# Patient Record
Sex: Female | Born: 1945
Health system: Southern US, Community
[De-identification: ages and names within clinical notes are randomized; demographics above are authoritative.]

## PROBLEM LIST (undated history)

## (undated) DIAGNOSIS — G2401 Drug induced subacute dyskinesia: Secondary | ICD-10-CM

## (undated) DIAGNOSIS — I77819 Aortic ectasia, unspecified site: Secondary | ICD-10-CM

## (undated) DIAGNOSIS — F200 Paranoid schizophrenia: Secondary | ICD-10-CM

## (undated) DIAGNOSIS — R413 Other amnesia: Secondary | ICD-10-CM

## (undated) DIAGNOSIS — J449 Chronic obstructive pulmonary disease, unspecified: Secondary | ICD-10-CM

## (undated) DIAGNOSIS — F99 Mental disorder, not otherwise specified: Secondary | ICD-10-CM

## (undated) DIAGNOSIS — K219 Gastro-esophageal reflux disease without esophagitis: Secondary | ICD-10-CM

## (undated) DIAGNOSIS — E119 Type 2 diabetes mellitus without complications: Secondary | ICD-10-CM

## (undated) DIAGNOSIS — I2510 Atherosclerotic heart disease of native coronary artery without angina pectoris: Secondary | ICD-10-CM

## (undated) DIAGNOSIS — N189 Chronic kidney disease, unspecified: Secondary | ICD-10-CM

## (undated) DIAGNOSIS — Z950 Presence of cardiac pacemaker: Secondary | ICD-10-CM

## (undated) DIAGNOSIS — I4891 Unspecified atrial fibrillation: Secondary | ICD-10-CM

## (undated) DIAGNOSIS — I10 Essential (primary) hypertension: Secondary | ICD-10-CM

## (undated) DIAGNOSIS — I509 Heart failure, unspecified: Secondary | ICD-10-CM

## (undated) DIAGNOSIS — F29 Unspecified psychosis not due to a substance or known physiological condition: Secondary | ICD-10-CM

## (undated) HISTORY — DX: Unspecified atrial fibrillation: I48.91

## (undated) HISTORY — DX: Aortic ectasia, unspecified site: I77.819

## (undated) HISTORY — PX: ELBOW SURGERY: SHX618

## (undated) HISTORY — DX: Presence of cardiac pacemaker: Z95.0

## (undated) HISTORY — DX: Drug induced subacute dyskinesia: G24.01

## (undated) HISTORY — PX: CHOLECYSTECTOMY: SHX55

## (undated) HISTORY — DX: Unspecified psychosis not due to a substance or known physiological condition: F29

## (undated) HISTORY — DX: Essential (primary) hypertension: I10

## (undated) HISTORY — PX: TUBAL LIGATION: SHX77

## (undated) HISTORY — DX: Chronic kidney disease, unspecified: N18.9

## (undated) HISTORY — DX: Type 2 diabetes mellitus without complications: E11.9

## (undated) HISTORY — DX: Other amnesia: R41.3

---

## 1997-05-05 ENCOUNTER — Encounter: Admission: RE | Admit: 1997-05-05 | Discharge: 1997-05-05 | Payer: Self-pay | Admitting: Family Medicine

## 1997-05-20 ENCOUNTER — Encounter: Admission: RE | Admit: 1997-05-20 | Discharge: 1997-05-20 | Payer: Self-pay | Admitting: Obstetrics

## 1997-06-02 ENCOUNTER — Encounter: Admission: RE | Admit: 1997-06-02 | Discharge: 1997-06-02 | Payer: Self-pay | Admitting: Family Medicine

## 1997-06-03 ENCOUNTER — Encounter: Admission: RE | Admit: 1997-06-03 | Discharge: 1997-06-03 | Payer: Self-pay | Admitting: Obstetrics

## 1997-07-12 ENCOUNTER — Encounter: Admission: RE | Admit: 1997-07-12 | Discharge: 1997-07-12 | Payer: Self-pay | Admitting: Family Medicine

## 1997-08-04 ENCOUNTER — Encounter: Admission: RE | Admit: 1997-08-04 | Discharge: 1997-08-04 | Payer: Self-pay | Admitting: Family Medicine

## 1997-08-13 ENCOUNTER — Encounter: Admission: RE | Admit: 1997-08-13 | Discharge: 1997-08-13 | Payer: Self-pay | Admitting: Family Medicine

## 1997-08-18 ENCOUNTER — Other Ambulatory Visit: Admission: RE | Admit: 1997-08-18 | Discharge: 1997-08-18 | Payer: Self-pay | Admitting: *Deleted

## 1997-08-18 ENCOUNTER — Encounter: Admission: RE | Admit: 1997-08-18 | Discharge: 1997-08-18 | Payer: Self-pay | Admitting: Family Medicine

## 1997-08-24 ENCOUNTER — Encounter: Admission: RE | Admit: 1997-08-24 | Discharge: 1997-08-24 | Payer: Self-pay | Admitting: Family Medicine

## 1997-08-30 ENCOUNTER — Encounter: Admission: RE | Admit: 1997-08-30 | Discharge: 1997-08-30 | Payer: Self-pay | Admitting: Sports Medicine

## 1997-09-17 ENCOUNTER — Encounter: Admission: RE | Admit: 1997-09-17 | Discharge: 1997-09-17 | Payer: Self-pay | Admitting: Family Medicine

## 1997-11-16 ENCOUNTER — Encounter: Admission: RE | Admit: 1997-11-16 | Discharge: 1997-11-16 | Payer: Self-pay | Admitting: Sports Medicine

## 1997-11-26 ENCOUNTER — Encounter: Admission: RE | Admit: 1997-11-26 | Discharge: 1997-11-26 | Payer: Self-pay | Admitting: Family Medicine

## 1997-11-30 ENCOUNTER — Encounter: Admission: RE | Admit: 1997-11-30 | Discharge: 1997-11-30 | Payer: Self-pay | Admitting: Sports Medicine

## 1997-12-17 ENCOUNTER — Encounter: Admission: RE | Admit: 1997-12-17 | Discharge: 1997-12-17 | Payer: Self-pay | Admitting: Family Medicine

## 1998-02-13 ENCOUNTER — Emergency Department (HOSPITAL_COMMUNITY): Admission: EM | Admit: 1998-02-13 | Discharge: 1998-02-13 | Payer: Self-pay | Admitting: Emergency Medicine

## 1998-02-13 ENCOUNTER — Encounter: Payer: Self-pay | Admitting: Emergency Medicine

## 1998-03-23 ENCOUNTER — Encounter: Admission: RE | Admit: 1998-03-23 | Discharge: 1998-03-23 | Payer: Self-pay | Admitting: Family Medicine

## 1998-04-13 ENCOUNTER — Encounter: Admission: RE | Admit: 1998-04-13 | Discharge: 1998-04-13 | Payer: Self-pay | Admitting: Family Medicine

## 1998-05-03 ENCOUNTER — Encounter: Admission: RE | Admit: 1998-05-03 | Discharge: 1998-05-03 | Payer: Self-pay | Admitting: Sports Medicine

## 1998-05-11 ENCOUNTER — Encounter: Admission: RE | Admit: 1998-05-11 | Discharge: 1998-05-11 | Payer: Self-pay | Admitting: Family Medicine

## 1998-05-17 ENCOUNTER — Encounter: Admission: RE | Admit: 1998-05-17 | Discharge: 1998-05-17 | Payer: Self-pay | Admitting: Sports Medicine

## 1998-05-24 ENCOUNTER — Encounter: Admission: RE | Admit: 1998-05-24 | Discharge: 1998-05-24 | Payer: Self-pay | Admitting: Family Medicine

## 1998-06-03 ENCOUNTER — Encounter: Admission: RE | Admit: 1998-06-03 | Discharge: 1998-06-03 | Payer: Self-pay | Admitting: Family Medicine

## 1998-07-08 ENCOUNTER — Encounter: Admission: RE | Admit: 1998-07-08 | Discharge: 1998-07-08 | Payer: Self-pay | Admitting: Family Medicine

## 1998-07-26 ENCOUNTER — Encounter: Admission: RE | Admit: 1998-07-26 | Discharge: 1998-07-26 | Payer: Self-pay | Admitting: Obstetrics & Gynecology

## 1998-07-27 ENCOUNTER — Encounter: Admission: RE | Admit: 1998-07-27 | Discharge: 1998-07-27 | Payer: Self-pay | Admitting: Family Medicine

## 1998-08-23 ENCOUNTER — Encounter: Admission: RE | Admit: 1998-08-23 | Discharge: 1998-08-23 | Payer: Self-pay | Admitting: Family Medicine

## 1998-08-31 ENCOUNTER — Encounter: Admission: RE | Admit: 1998-08-31 | Discharge: 1998-08-31 | Payer: Self-pay | Admitting: Family Medicine

## 1998-09-20 ENCOUNTER — Encounter: Admission: RE | Admit: 1998-09-20 | Discharge: 1998-09-20 | Payer: Self-pay | Admitting: Sports Medicine

## 1998-09-20 ENCOUNTER — Other Ambulatory Visit: Admission: RE | Admit: 1998-09-20 | Discharge: 1998-09-20 | Payer: Self-pay | Admitting: Sports Medicine

## 1998-10-28 ENCOUNTER — Encounter: Admission: RE | Admit: 1998-10-28 | Discharge: 1998-10-28 | Payer: Self-pay | Admitting: Family Medicine

## 1999-01-30 ENCOUNTER — Encounter: Payer: Self-pay | Admitting: Emergency Medicine

## 1999-01-30 ENCOUNTER — Emergency Department (HOSPITAL_COMMUNITY): Admission: EM | Admit: 1999-01-30 | Discharge: 1999-01-30 | Payer: Self-pay | Admitting: Emergency Medicine

## 1999-02-01 ENCOUNTER — Encounter: Admission: RE | Admit: 1999-02-01 | Discharge: 1999-02-01 | Payer: Self-pay | Admitting: Family Medicine

## 1999-03-08 ENCOUNTER — Ambulatory Visit (HOSPITAL_COMMUNITY): Admission: RE | Admit: 1999-03-08 | Discharge: 1999-03-08 | Payer: Self-pay | Admitting: Family Medicine

## 1999-03-08 ENCOUNTER — Encounter: Admission: RE | Admit: 1999-03-08 | Discharge: 1999-03-08 | Payer: Self-pay | Admitting: Family Medicine

## 1999-05-02 ENCOUNTER — Encounter: Admission: RE | Admit: 1999-05-02 | Discharge: 1999-05-02 | Payer: Self-pay | Admitting: Family Medicine

## 1999-05-11 ENCOUNTER — Encounter: Admission: RE | Admit: 1999-05-11 | Discharge: 1999-05-11 | Payer: Self-pay | Admitting: Obstetrics

## 1999-05-11 ENCOUNTER — Other Ambulatory Visit: Admission: RE | Admit: 1999-05-11 | Discharge: 1999-05-11 | Payer: Self-pay | Admitting: Obstetrics

## 1999-06-13 ENCOUNTER — Encounter: Admission: RE | Admit: 1999-06-13 | Discharge: 1999-06-13 | Payer: Self-pay | Admitting: Sports Medicine

## 1999-08-29 ENCOUNTER — Encounter: Admission: RE | Admit: 1999-08-29 | Discharge: 1999-08-29 | Payer: Self-pay | Admitting: Family Medicine

## 1999-09-21 ENCOUNTER — Encounter: Admission: RE | Admit: 1999-09-21 | Discharge: 1999-09-21 | Payer: Self-pay | Admitting: Family Medicine

## 1999-09-28 ENCOUNTER — Encounter: Admission: RE | Admit: 1999-09-28 | Discharge: 1999-09-28 | Payer: Self-pay | Admitting: Family Medicine

## 1999-10-03 ENCOUNTER — Encounter: Admission: RE | Admit: 1999-10-03 | Discharge: 1999-10-03 | Payer: Self-pay | Admitting: Family Medicine

## 1999-11-22 ENCOUNTER — Encounter: Admission: RE | Admit: 1999-11-22 | Discharge: 1999-11-22 | Payer: Self-pay | Admitting: Family Medicine

## 2000-01-15 ENCOUNTER — Encounter: Admission: RE | Admit: 2000-01-15 | Discharge: 2000-01-15 | Payer: Self-pay | Admitting: Family Medicine

## 2000-01-15 ENCOUNTER — Ambulatory Visit (HOSPITAL_COMMUNITY): Admission: RE | Admit: 2000-01-15 | Discharge: 2000-01-15 | Payer: Self-pay | Admitting: Family Medicine

## 2000-02-06 ENCOUNTER — Encounter: Payer: Self-pay | Admitting: Urology

## 2000-03-08 ENCOUNTER — Encounter: Admission: RE | Admit: 2000-03-08 | Discharge: 2000-03-08 | Payer: Self-pay | Admitting: Family Medicine

## 2000-03-12 ENCOUNTER — Ambulatory Visit (HOSPITAL_COMMUNITY): Admission: RE | Admit: 2000-03-12 | Discharge: 2000-03-12 | Payer: Self-pay | Admitting: Urology

## 2000-04-03 ENCOUNTER — Encounter: Admission: RE | Admit: 2000-04-03 | Discharge: 2000-04-03 | Payer: Self-pay | Admitting: Family Medicine

## 2000-05-13 ENCOUNTER — Encounter: Admission: RE | Admit: 2000-05-13 | Discharge: 2000-05-13 | Payer: Self-pay | Admitting: Family Medicine

## 2000-10-04 ENCOUNTER — Encounter: Admission: RE | Admit: 2000-10-04 | Discharge: 2000-10-04 | Payer: Self-pay | Admitting: Family Medicine

## 2000-10-11 ENCOUNTER — Ambulatory Visit (HOSPITAL_COMMUNITY): Admission: RE | Admit: 2000-10-11 | Discharge: 2000-10-11 | Payer: Self-pay | Admitting: Family Medicine

## 2000-10-11 ENCOUNTER — Encounter: Admission: RE | Admit: 2000-10-11 | Discharge: 2000-10-11 | Payer: Self-pay | Admitting: Family Medicine

## 2000-10-31 ENCOUNTER — Encounter: Admission: RE | Admit: 2000-10-31 | Discharge: 2000-10-31 | Payer: Self-pay | Admitting: Family Medicine

## 2001-02-19 ENCOUNTER — Encounter: Admission: RE | Admit: 2001-02-19 | Discharge: 2001-02-19 | Payer: Self-pay | Admitting: Family Medicine

## 2001-05-14 ENCOUNTER — Encounter: Admission: RE | Admit: 2001-05-14 | Discharge: 2001-05-14 | Payer: Self-pay | Admitting: Family Medicine

## 2001-05-28 ENCOUNTER — Encounter: Admission: RE | Admit: 2001-05-28 | Discharge: 2001-05-28 | Payer: Self-pay | Admitting: Sports Medicine

## 2001-05-28 ENCOUNTER — Encounter: Payer: Self-pay | Admitting: Sports Medicine

## 2001-06-13 ENCOUNTER — Encounter: Admission: RE | Admit: 2001-06-13 | Discharge: 2001-06-13 | Payer: Self-pay | Admitting: Family Medicine

## 2001-06-13 ENCOUNTER — Ambulatory Visit (HOSPITAL_COMMUNITY): Admission: RE | Admit: 2001-06-13 | Discharge: 2001-06-13 | Payer: Self-pay | Admitting: Family Medicine

## 2001-08-19 ENCOUNTER — Encounter: Admission: RE | Admit: 2001-08-19 | Discharge: 2001-08-19 | Payer: Self-pay | Admitting: Family Medicine

## 2001-09-21 ENCOUNTER — Emergency Department (HOSPITAL_COMMUNITY): Admission: EM | Admit: 2001-09-21 | Discharge: 2001-09-21 | Payer: Self-pay | Admitting: Emergency Medicine

## 2001-09-22 ENCOUNTER — Ambulatory Visit (HOSPITAL_COMMUNITY): Admission: RE | Admit: 2001-09-22 | Discharge: 2001-09-22 | Payer: Self-pay

## 2001-09-22 ENCOUNTER — Encounter: Admission: RE | Admit: 2001-09-22 | Discharge: 2001-09-22 | Payer: Self-pay | Admitting: Family Medicine

## 2001-10-06 ENCOUNTER — Ambulatory Visit (HOSPITAL_COMMUNITY): Admission: RE | Admit: 2001-10-06 | Discharge: 2001-10-06 | Payer: Self-pay | Admitting: Surgery

## 2001-10-06 ENCOUNTER — Encounter: Payer: Self-pay | Admitting: Surgery

## 2001-10-27 ENCOUNTER — Ambulatory Visit (HOSPITAL_COMMUNITY): Admission: RE | Admit: 2001-10-27 | Discharge: 2001-10-28 | Payer: Self-pay | Admitting: Surgery

## 2001-10-27 ENCOUNTER — Encounter (INDEPENDENT_AMBULATORY_CARE_PROVIDER_SITE_OTHER): Payer: Self-pay | Admitting: *Deleted

## 2001-10-27 ENCOUNTER — Encounter: Payer: Self-pay | Admitting: Surgery

## 2001-12-17 ENCOUNTER — Encounter: Admission: RE | Admit: 2001-12-17 | Discharge: 2001-12-17 | Payer: Self-pay | Admitting: Family Medicine

## 2001-12-23 ENCOUNTER — Encounter: Admission: RE | Admit: 2001-12-23 | Discharge: 2001-12-23 | Payer: Self-pay | Admitting: Family Medicine

## 2002-01-01 ENCOUNTER — Encounter: Admission: RE | Admit: 2002-01-01 | Discharge: 2002-01-01 | Payer: Self-pay | Admitting: Family Medicine

## 2002-01-14 ENCOUNTER — Encounter: Admission: RE | Admit: 2002-01-14 | Discharge: 2002-01-14 | Payer: Self-pay | Admitting: Sports Medicine

## 2002-05-27 ENCOUNTER — Encounter: Admission: RE | Admit: 2002-05-27 | Discharge: 2002-05-27 | Payer: Self-pay | Admitting: Family Medicine

## 2002-06-24 ENCOUNTER — Encounter: Admission: RE | Admit: 2002-06-24 | Discharge: 2002-06-24 | Payer: Self-pay | Admitting: Family Medicine

## 2002-07-24 ENCOUNTER — Encounter: Admission: RE | Admit: 2002-07-24 | Discharge: 2002-07-24 | Payer: Self-pay | Admitting: Sports Medicine

## 2002-07-24 ENCOUNTER — Encounter: Payer: Self-pay | Admitting: Sports Medicine

## 2002-07-29 ENCOUNTER — Encounter: Admission: RE | Admit: 2002-07-29 | Discharge: 2002-07-29 | Payer: Self-pay | Admitting: Family Medicine

## 2002-08-14 ENCOUNTER — Encounter: Admission: RE | Admit: 2002-08-14 | Discharge: 2002-08-14 | Payer: Self-pay | Admitting: Family Medicine

## 2002-08-18 ENCOUNTER — Encounter: Admission: RE | Admit: 2002-08-18 | Discharge: 2002-08-18 | Payer: Self-pay | Admitting: Obstetrics and Gynecology

## 2002-08-18 ENCOUNTER — Encounter (INDEPENDENT_AMBULATORY_CARE_PROVIDER_SITE_OTHER): Payer: Self-pay | Admitting: *Deleted

## 2002-08-27 ENCOUNTER — Ambulatory Visit (HOSPITAL_COMMUNITY): Admission: RE | Admit: 2002-08-27 | Discharge: 2002-08-27 | Payer: Self-pay | Admitting: Obstetrics and Gynecology

## 2002-09-24 ENCOUNTER — Encounter: Admission: RE | Admit: 2002-09-24 | Discharge: 2002-09-24 | Payer: Self-pay | Admitting: Sports Medicine

## 2002-11-06 ENCOUNTER — Encounter: Admission: RE | Admit: 2002-11-06 | Discharge: 2002-11-06 | Payer: Self-pay | Admitting: Sports Medicine

## 2002-11-24 ENCOUNTER — Encounter: Admission: RE | Admit: 2002-11-24 | Discharge: 2002-11-24 | Payer: Self-pay | Admitting: Sports Medicine

## 2003-01-22 ENCOUNTER — Encounter: Admission: RE | Admit: 2003-01-22 | Discharge: 2003-01-22 | Payer: Self-pay | Admitting: Family Medicine

## 2003-02-03 ENCOUNTER — Encounter: Admission: RE | Admit: 2003-02-03 | Discharge: 2003-02-03 | Payer: Self-pay | Admitting: Family Medicine

## 2003-02-03 ENCOUNTER — Ambulatory Visit (HOSPITAL_COMMUNITY): Admission: RE | Admit: 2003-02-03 | Discharge: 2003-02-03 | Payer: Self-pay | Admitting: Family Medicine

## 2003-05-27 ENCOUNTER — Emergency Department (HOSPITAL_COMMUNITY): Admission: EM | Admit: 2003-05-27 | Discharge: 2003-05-27 | Payer: Self-pay | Admitting: Family Medicine

## 2003-07-26 ENCOUNTER — Encounter: Payer: Self-pay | Admitting: Family Medicine

## 2003-07-29 ENCOUNTER — Encounter: Admission: RE | Admit: 2003-07-29 | Discharge: 2003-07-29 | Payer: Self-pay | Admitting: Family Medicine

## 2003-08-06 ENCOUNTER — Ambulatory Visit (HOSPITAL_COMMUNITY): Admission: RE | Admit: 2003-08-06 | Discharge: 2003-08-07 | Payer: Self-pay | Admitting: Cardiology

## 2003-09-06 ENCOUNTER — Encounter: Admission: RE | Admit: 2003-09-06 | Discharge: 2003-09-06 | Payer: Self-pay | Admitting: Family Medicine

## 2003-09-09 ENCOUNTER — Encounter: Admission: RE | Admit: 2003-09-09 | Discharge: 2003-09-09 | Payer: Self-pay | Admitting: Sports Medicine

## 2003-09-24 ENCOUNTER — Encounter: Admission: RE | Admit: 2003-09-24 | Discharge: 2003-09-24 | Payer: Self-pay | Admitting: Sports Medicine

## 2003-11-04 ENCOUNTER — Ambulatory Visit: Payer: Self-pay | Admitting: Family Medicine

## 2003-12-24 ENCOUNTER — Ambulatory Visit: Payer: Self-pay | Admitting: Sports Medicine

## 2004-01-27 ENCOUNTER — Encounter: Admission: RE | Admit: 2004-01-27 | Discharge: 2004-01-27 | Payer: Self-pay | Admitting: Sports Medicine

## 2004-01-27 ENCOUNTER — Ambulatory Visit: Payer: Self-pay | Admitting: Family Medicine

## 2004-04-11 ENCOUNTER — Ambulatory Visit: Payer: Self-pay | Admitting: Family Medicine

## 2004-04-15 ENCOUNTER — Encounter (INDEPENDENT_AMBULATORY_CARE_PROVIDER_SITE_OTHER): Payer: Self-pay | Admitting: *Deleted

## 2004-05-12 ENCOUNTER — Other Ambulatory Visit: Admission: RE | Admit: 2004-05-12 | Discharge: 2004-05-12 | Payer: Self-pay | Admitting: Family Medicine

## 2004-05-12 ENCOUNTER — Ambulatory Visit: Payer: Self-pay | Admitting: Family Medicine

## 2004-05-12 ENCOUNTER — Ambulatory Visit (HOSPITAL_COMMUNITY): Admission: RE | Admit: 2004-05-12 | Discharge: 2004-05-12 | Payer: Self-pay | Admitting: Family Medicine

## 2004-05-17 ENCOUNTER — Ambulatory Visit (HOSPITAL_COMMUNITY): Admission: RE | Admit: 2004-05-17 | Discharge: 2004-05-17 | Payer: Self-pay | Admitting: Family Medicine

## 2004-05-22 ENCOUNTER — Ambulatory Visit: Payer: Self-pay | Admitting: Sports Medicine

## 2004-07-04 ENCOUNTER — Ambulatory Visit: Payer: Self-pay | Admitting: Family Medicine

## 2004-07-24 ENCOUNTER — Ambulatory Visit: Payer: Self-pay | Admitting: Sports Medicine

## 2004-11-03 ENCOUNTER — Ambulatory Visit: Payer: Self-pay | Admitting: Sports Medicine

## 2005-01-15 HISTORY — PX: INSERT / REPLACE / REMOVE PACEMAKER: SUR710

## 2005-01-15 HISTORY — PX: CARDIAC CATHETERIZATION: SHX172

## 2005-02-13 ENCOUNTER — Ambulatory Visit: Payer: Self-pay | Admitting: Sports Medicine

## 2005-03-14 ENCOUNTER — Ambulatory Visit: Payer: Self-pay | Admitting: Family Medicine

## 2005-05-07 ENCOUNTER — Ambulatory Visit: Payer: Self-pay | Admitting: Family Medicine

## 2005-05-07 ENCOUNTER — Ambulatory Visit (HOSPITAL_COMMUNITY): Admission: RE | Admit: 2005-05-07 | Discharge: 2005-05-07 | Payer: Self-pay | Admitting: Family Medicine

## 2005-05-21 ENCOUNTER — Ambulatory Visit: Payer: Self-pay | Admitting: Family Medicine

## 2005-06-04 ENCOUNTER — Ambulatory Visit: Payer: Self-pay | Admitting: Family Medicine

## 2005-07-09 ENCOUNTER — Ambulatory Visit: Payer: Self-pay | Admitting: Sports Medicine

## 2005-08-15 ENCOUNTER — Ambulatory Visit: Payer: Self-pay | Admitting: Obstetrics and Gynecology

## 2005-08-15 ENCOUNTER — Encounter: Payer: Self-pay | Admitting: Obstetrics and Gynecology

## 2005-08-17 ENCOUNTER — Ambulatory Visit (HOSPITAL_COMMUNITY): Admission: RE | Admit: 2005-08-17 | Discharge: 2005-08-17 | Payer: Self-pay | Admitting: Family Medicine

## 2005-09-12 ENCOUNTER — Other Ambulatory Visit: Admission: RE | Admit: 2005-09-12 | Discharge: 2005-09-12 | Payer: Self-pay | Admitting: Obstetrics and Gynecology

## 2005-09-12 ENCOUNTER — Ambulatory Visit: Payer: Self-pay | Admitting: Obstetrics and Gynecology

## 2005-09-12 ENCOUNTER — Encounter (INDEPENDENT_AMBULATORY_CARE_PROVIDER_SITE_OTHER): Payer: Self-pay | Admitting: Specialist

## 2005-09-20 ENCOUNTER — Ambulatory Visit: Payer: Self-pay | Admitting: Family Medicine

## 2005-09-28 ENCOUNTER — Ambulatory Visit: Payer: Self-pay | Admitting: Family Medicine

## 2005-09-28 ENCOUNTER — Inpatient Hospital Stay (HOSPITAL_COMMUNITY): Admission: EM | Admit: 2005-09-28 | Discharge: 2005-10-01 | Payer: Self-pay | Admitting: Emergency Medicine

## 2005-09-28 HISTORY — PX: PACEMAKER PLACEMENT: SHX43

## 2005-10-09 ENCOUNTER — Ambulatory Visit: Payer: Self-pay | Admitting: Family Medicine

## 2005-10-31 ENCOUNTER — Ambulatory Visit (HOSPITAL_COMMUNITY): Admission: RE | Admit: 2005-10-31 | Discharge: 2005-10-31 | Payer: Self-pay | Admitting: Family Medicine

## 2005-10-31 ENCOUNTER — Ambulatory Visit: Payer: Self-pay | Admitting: Family Medicine

## 2005-11-12 ENCOUNTER — Ambulatory Visit: Payer: Self-pay | Admitting: Family Medicine

## 2006-01-25 ENCOUNTER — Ambulatory Visit (HOSPITAL_COMMUNITY): Admission: RE | Admit: 2006-01-25 | Discharge: 2006-01-25 | Payer: Self-pay | Admitting: Family Medicine

## 2006-01-25 ENCOUNTER — Ambulatory Visit: Payer: Self-pay | Admitting: Family Medicine

## 2006-01-25 ENCOUNTER — Encounter (INDEPENDENT_AMBULATORY_CARE_PROVIDER_SITE_OTHER): Payer: Self-pay | Admitting: Family Medicine

## 2006-01-25 LAB — CONVERTED CEMR LAB
AST: 16 units/L (ref 0–37)
Albumin: 4.1 g/dL (ref 3.5–5.2)
BUN: 16 mg/dL (ref 6–23)
CO2: 19 meq/L (ref 19–32)
Creatinine, Ser: 1.4 mg/dL — ABNORMAL HIGH (ref 0.40–1.20)
Glucose, Bld: 98 mg/dL (ref 70–99)
Sodium: 139 meq/L (ref 135–145)
Total Protein: 6.8 g/dL (ref 6.0–8.3)

## 2006-01-26 ENCOUNTER — Encounter (INDEPENDENT_AMBULATORY_CARE_PROVIDER_SITE_OTHER): Payer: Self-pay | Admitting: Family Medicine

## 2006-03-14 DIAGNOSIS — F200 Paranoid schizophrenia: Secondary | ICD-10-CM | POA: Insufficient documentation

## 2006-03-14 DIAGNOSIS — J4489 Other specified chronic obstructive pulmonary disease: Secondary | ICD-10-CM | POA: Insufficient documentation

## 2006-03-14 DIAGNOSIS — E782 Mixed hyperlipidemia: Secondary | ICD-10-CM | POA: Insufficient documentation

## 2006-03-14 DIAGNOSIS — F209 Schizophrenia, unspecified: Secondary | ICD-10-CM | POA: Insufficient documentation

## 2006-03-14 DIAGNOSIS — J449 Chronic obstructive pulmonary disease, unspecified: Secondary | ICD-10-CM | POA: Insufficient documentation

## 2006-03-15 ENCOUNTER — Encounter (INDEPENDENT_AMBULATORY_CARE_PROVIDER_SITE_OTHER): Payer: Self-pay | Admitting: *Deleted

## 2006-03-18 ENCOUNTER — Ambulatory Visit: Payer: Self-pay | Admitting: Sports Medicine

## 2006-04-16 ENCOUNTER — Telehealth (INDEPENDENT_AMBULATORY_CARE_PROVIDER_SITE_OTHER): Payer: Self-pay | Admitting: *Deleted

## 2006-04-18 ENCOUNTER — Encounter (INDEPENDENT_AMBULATORY_CARE_PROVIDER_SITE_OTHER): Payer: Self-pay | Admitting: Family Medicine

## 2006-04-24 ENCOUNTER — Encounter (INDEPENDENT_AMBULATORY_CARE_PROVIDER_SITE_OTHER): Payer: Self-pay | Admitting: Family Medicine

## 2006-04-24 DIAGNOSIS — I495 Sick sinus syndrome: Secondary | ICD-10-CM

## 2006-04-24 HISTORY — DX: Sick sinus syndrome: I49.5

## 2006-05-01 ENCOUNTER — Ambulatory Visit: Payer: Self-pay | Admitting: Family Medicine

## 2006-05-01 ENCOUNTER — Encounter (INDEPENDENT_AMBULATORY_CARE_PROVIDER_SITE_OTHER): Payer: Self-pay | Admitting: Family Medicine

## 2006-05-01 LAB — CONVERTED CEMR LAB
Glucose, Urine, Semiquant: NEGATIVE
Nitrite: NEGATIVE
Specific Gravity, Urine: 1.025
Urobilinogen, UA: 0.2
pH: 6

## 2006-05-02 LAB — CONVERTED CEMR LAB
BUN: 16 mg/dL (ref 6–23)
CO2: 19 meq/L (ref 19–32)
Calcium: 10.1 mg/dL (ref 8.4–10.5)
Creatinine, Ser: 1.31 mg/dL — ABNORMAL HIGH (ref 0.40–1.20)
Glucose, Bld: 95 mg/dL (ref 70–99)

## 2006-05-03 ENCOUNTER — Telehealth (INDEPENDENT_AMBULATORY_CARE_PROVIDER_SITE_OTHER): Payer: Self-pay | Admitting: Family Medicine

## 2006-05-06 ENCOUNTER — Telehealth: Payer: Self-pay | Admitting: *Deleted

## 2006-05-07 ENCOUNTER — Encounter: Payer: Self-pay | Admitting: *Deleted

## 2006-05-13 ENCOUNTER — Telehealth: Payer: Self-pay | Admitting: *Deleted

## 2006-05-17 ENCOUNTER — Encounter (INDEPENDENT_AMBULATORY_CARE_PROVIDER_SITE_OTHER): Payer: Self-pay | Admitting: Family Medicine

## 2006-05-21 ENCOUNTER — Telehealth (INDEPENDENT_AMBULATORY_CARE_PROVIDER_SITE_OTHER): Payer: Self-pay | Admitting: Family Medicine

## 2006-05-21 ENCOUNTER — Encounter (INDEPENDENT_AMBULATORY_CARE_PROVIDER_SITE_OTHER): Payer: Self-pay | Admitting: Family Medicine

## 2006-05-22 ENCOUNTER — Telehealth: Payer: Self-pay | Admitting: *Deleted

## 2006-05-29 ENCOUNTER — Telehealth (INDEPENDENT_AMBULATORY_CARE_PROVIDER_SITE_OTHER): Payer: Self-pay | Admitting: Family Medicine

## 2006-06-14 ENCOUNTER — Ambulatory Visit: Payer: Self-pay | Admitting: Family Medicine

## 2006-06-17 ENCOUNTER — Telehealth: Payer: Self-pay | Admitting: *Deleted

## 2006-07-01 ENCOUNTER — Encounter: Payer: Self-pay | Admitting: Family Medicine

## 2006-07-08 ENCOUNTER — Encounter (INDEPENDENT_AMBULATORY_CARE_PROVIDER_SITE_OTHER): Payer: Self-pay | Admitting: Family Medicine

## 2006-07-08 ENCOUNTER — Telehealth: Payer: Self-pay | Admitting: *Deleted

## 2006-07-08 ENCOUNTER — Encounter: Admission: RE | Admit: 2006-07-08 | Discharge: 2006-07-08 | Payer: Self-pay | Admitting: Sports Medicine

## 2006-07-08 ENCOUNTER — Ambulatory Visit: Payer: Self-pay | Admitting: Sports Medicine

## 2006-07-08 LAB — CONVERTED CEMR LAB
Bilirubin Urine: NEGATIVE
Glucose, Urine, Semiquant: NEGATIVE
HCT: 40.4 % (ref 36.0–46.0)
Hemoglobin: 12.9 g/dL (ref 12.0–15.0)
INR: 1 (ref 0.0–1.5)
Ketones, urine, test strip: NEGATIVE
MCHC: 31.9 g/dL (ref 30.0–36.0)
MCV: 85.4 fL (ref 78.0–100.0)
Nitrite: NEGATIVE
Platelets: 303 10*3/uL (ref 150–400)
Protein, U semiquant: >=300
Prothrombin Time: 13.3 s (ref 11.6–15.2)
RBC: 4.73 M/uL (ref 3.87–5.11)
RDW: 14.8 % — ABNORMAL HIGH (ref 11.5–14.0)
Specific Gravity, Urine: 1.01
Urobilinogen, UA: 0.2
WBC Urine, dipstick: NEGATIVE
WBC: 13.5 10*3/uL — ABNORMAL HIGH (ref 4.0–10.5)
pH: 6.5

## 2006-07-09 ENCOUNTER — Telehealth (INDEPENDENT_AMBULATORY_CARE_PROVIDER_SITE_OTHER): Payer: Self-pay | Admitting: Family Medicine

## 2006-07-10 ENCOUNTER — Telehealth (INDEPENDENT_AMBULATORY_CARE_PROVIDER_SITE_OTHER): Payer: Self-pay | Admitting: Family Medicine

## 2006-07-12 ENCOUNTER — Encounter (INDEPENDENT_AMBULATORY_CARE_PROVIDER_SITE_OTHER): Payer: Self-pay | Admitting: Family Medicine

## 2006-07-17 ENCOUNTER — Ambulatory Visit: Payer: Self-pay | Admitting: Gynecology

## 2006-07-29 ENCOUNTER — Ambulatory Visit: Payer: Self-pay | Admitting: Family Medicine

## 2006-07-29 ENCOUNTER — Encounter (INDEPENDENT_AMBULATORY_CARE_PROVIDER_SITE_OTHER): Payer: Self-pay | Admitting: Family Medicine

## 2006-07-29 LAB — CONVERTED CEMR LAB
Bilirubin Urine: NEGATIVE
Eosinophils Relative: 0 % (ref 0–5)
Glucose, Urine, Semiquant: NEGATIVE
HCT: 40.9 % (ref 36.0–46.0)
Hemoglobin: 13.1 g/dL (ref 12.0–15.0)
Lymphocytes Relative: 23 % (ref 12–46)
Lymphs Abs: 3.2 10*3/uL (ref 0.7–3.3)
Monocytes Absolute: 0.9 10*3/uL — ABNORMAL HIGH (ref 0.2–0.7)
Monocytes Relative: 6 % (ref 3–11)
Neutro Abs: 9.8 10*3/uL — ABNORMAL HIGH (ref 1.7–7.7)
Nitrite: NEGATIVE
RBC: 4.7 M/uL (ref 3.87–5.11)

## 2006-07-30 ENCOUNTER — Encounter (INDEPENDENT_AMBULATORY_CARE_PROVIDER_SITE_OTHER): Payer: Self-pay | Admitting: Family Medicine

## 2006-08-01 ENCOUNTER — Encounter (INDEPENDENT_AMBULATORY_CARE_PROVIDER_SITE_OTHER): Payer: Self-pay | Admitting: Family Medicine

## 2006-08-01 ENCOUNTER — Encounter (INDEPENDENT_AMBULATORY_CARE_PROVIDER_SITE_OTHER): Payer: Self-pay | Admitting: *Deleted

## 2006-08-27 ENCOUNTER — Telehealth (INDEPENDENT_AMBULATORY_CARE_PROVIDER_SITE_OTHER): Payer: Self-pay | Admitting: Family Medicine

## 2006-09-19 ENCOUNTER — Telehealth (INDEPENDENT_AMBULATORY_CARE_PROVIDER_SITE_OTHER): Payer: Self-pay | Admitting: Family Medicine

## 2006-10-14 ENCOUNTER — Telehealth: Payer: Self-pay | Admitting: *Deleted

## 2006-10-18 ENCOUNTER — Telehealth (INDEPENDENT_AMBULATORY_CARE_PROVIDER_SITE_OTHER): Payer: Self-pay | Admitting: Family Medicine

## 2006-10-18 ENCOUNTER — Ambulatory Visit: Payer: Self-pay | Admitting: Family Medicine

## 2006-10-18 ENCOUNTER — Encounter: Payer: Self-pay | Admitting: Family Medicine

## 2006-10-18 ENCOUNTER — Inpatient Hospital Stay (HOSPITAL_COMMUNITY): Admission: AD | Admit: 2006-10-18 | Discharge: 2006-10-30 | Payer: Self-pay | Admitting: Family Medicine

## 2006-10-18 DIAGNOSIS — I2510 Atherosclerotic heart disease of native coronary artery without angina pectoris: Secondary | ICD-10-CM | POA: Insufficient documentation

## 2006-10-30 ENCOUNTER — Telehealth (INDEPENDENT_AMBULATORY_CARE_PROVIDER_SITE_OTHER): Payer: Self-pay | Admitting: Family Medicine

## 2006-10-31 ENCOUNTER — Telehealth (INDEPENDENT_AMBULATORY_CARE_PROVIDER_SITE_OTHER): Payer: Self-pay | Admitting: Family Medicine

## 2006-11-05 ENCOUNTER — Telehealth (INDEPENDENT_AMBULATORY_CARE_PROVIDER_SITE_OTHER): Payer: Self-pay | Admitting: Family Medicine

## 2006-11-07 ENCOUNTER — Ambulatory Visit: Payer: Self-pay | Admitting: Family Medicine

## 2006-11-07 ENCOUNTER — Encounter (INDEPENDENT_AMBULATORY_CARE_PROVIDER_SITE_OTHER): Payer: Self-pay | Admitting: Family Medicine

## 2006-11-07 LAB — CONVERTED CEMR LAB
BUN: 17 mg/dL (ref 6–23)
CO2: 22 meq/L (ref 19–32)
Chloride: 102 meq/L (ref 96–112)
Creatinine, Ser: 1.39 mg/dL — ABNORMAL HIGH (ref 0.40–1.20)
Glucose, Bld: 108 mg/dL — ABNORMAL HIGH (ref 70–99)

## 2006-11-20 ENCOUNTER — Encounter (INDEPENDENT_AMBULATORY_CARE_PROVIDER_SITE_OTHER): Payer: Self-pay | Admitting: Family Medicine

## 2006-11-20 ENCOUNTER — Ambulatory Visit: Payer: Self-pay | Admitting: Family Medicine

## 2006-11-20 LAB — CONVERTED CEMR LAB
BUN: 9 mg/dL (ref 6–23)
Calcium: 9 mg/dL (ref 8.4–10.5)
Chloride: 105 meq/L (ref 96–112)
Creatinine, Ser: 1.15 mg/dL (ref 0.40–1.20)

## 2006-11-21 ENCOUNTER — Encounter (INDEPENDENT_AMBULATORY_CARE_PROVIDER_SITE_OTHER): Payer: Self-pay | Admitting: Family Medicine

## 2006-11-28 ENCOUNTER — Encounter (INDEPENDENT_AMBULATORY_CARE_PROVIDER_SITE_OTHER): Payer: Self-pay | Admitting: Family Medicine

## 2006-12-05 ENCOUNTER — Ambulatory Visit: Payer: Self-pay | Admitting: Family Medicine

## 2007-02-28 ENCOUNTER — Telehealth (INDEPENDENT_AMBULATORY_CARE_PROVIDER_SITE_OTHER): Payer: Self-pay | Admitting: Family Medicine

## 2007-03-04 ENCOUNTER — Encounter: Payer: Self-pay | Admitting: *Deleted

## 2007-03-06 ENCOUNTER — Ambulatory Visit: Payer: Self-pay | Admitting: Family Medicine

## 2007-03-07 ENCOUNTER — Telehealth (INDEPENDENT_AMBULATORY_CARE_PROVIDER_SITE_OTHER): Payer: Self-pay | Admitting: Family Medicine

## 2007-03-27 ENCOUNTER — Telehealth: Payer: Self-pay | Admitting: Family Medicine

## 2007-03-31 ENCOUNTER — Telehealth: Payer: Self-pay | Admitting: *Deleted

## 2007-06-13 ENCOUNTER — Encounter (INDEPENDENT_AMBULATORY_CARE_PROVIDER_SITE_OTHER): Payer: Self-pay | Admitting: Family Medicine

## 2007-09-11 ENCOUNTER — Ambulatory Visit: Payer: Self-pay | Admitting: Family Medicine

## 2007-09-11 ENCOUNTER — Encounter (INDEPENDENT_AMBULATORY_CARE_PROVIDER_SITE_OTHER): Payer: Self-pay | Admitting: Family Medicine

## 2007-09-11 LAB — CONVERTED CEMR LAB
Albumin: 4.2 g/dL (ref 3.5–5.2)
Alkaline Phosphatase: 71 units/L (ref 39–117)
Alpha-2-Globulin: 11.4 % (ref 7.1–11.8)
Chloride: 104 meq/L (ref 96–112)
Glucose, Bld: 90 mg/dL (ref 70–99)
IgG (Immunoglobin G), Serum: 1120 mg/dL (ref 694–1618)
IgM, Serum: 105 mg/dL (ref 60–263)
LDL Cholesterol: 123 mg/dL — ABNORMAL HIGH (ref 0–99)
MCHC: 31.5 g/dL (ref 30.0–36.0)
Potassium: 4.1 meq/L (ref 3.5–5.3)
RBC: 4.86 M/uL (ref 3.87–5.11)
Sodium: 140 meq/L (ref 135–145)
Total Protein, Serum Electrophoresis: 7.2 g/dL (ref 6.0–8.3)
Total Protein: 7.2 g/dL (ref 6.0–8.3)
Triglycerides: 360 mg/dL — ABNORMAL HIGH (ref <150)
WBC: 11.3 10*3/uL — ABNORMAL HIGH (ref 4.0–10.5)

## 2007-09-12 ENCOUNTER — Encounter (INDEPENDENT_AMBULATORY_CARE_PROVIDER_SITE_OTHER): Payer: Self-pay | Admitting: Family Medicine

## 2007-09-15 ENCOUNTER — Telehealth (INDEPENDENT_AMBULATORY_CARE_PROVIDER_SITE_OTHER): Payer: Self-pay | Admitting: Family Medicine

## 2007-09-25 ENCOUNTER — Telehealth (INDEPENDENT_AMBULATORY_CARE_PROVIDER_SITE_OTHER): Payer: Self-pay | Admitting: *Deleted

## 2007-10-10 ENCOUNTER — Ambulatory Visit: Payer: Self-pay | Admitting: Family Medicine

## 2007-10-10 ENCOUNTER — Telehealth (INDEPENDENT_AMBULATORY_CARE_PROVIDER_SITE_OTHER): Payer: Self-pay | Admitting: Family Medicine

## 2007-10-10 ENCOUNTER — Encounter (INDEPENDENT_AMBULATORY_CARE_PROVIDER_SITE_OTHER): Payer: Self-pay | Admitting: Family Medicine

## 2007-10-10 ENCOUNTER — Other Ambulatory Visit: Admission: RE | Admit: 2007-10-10 | Discharge: 2007-10-10 | Payer: Self-pay | Admitting: Family Medicine

## 2007-10-10 LAB — CONVERTED CEMR LAB: Pap Smear: NEGATIVE

## 2007-10-14 LAB — CONVERTED CEMR LAB
CO2: 21 meq/L (ref 19–32)
Creatinine, Ser: 1.74 mg/dL — ABNORMAL HIGH (ref 0.40–1.20)
Glucose, Bld: 111 mg/dL — ABNORMAL HIGH (ref 70–99)
HCT: 43 % (ref 36.0–46.0)
MCV: 85.8 fL (ref 78.0–100.0)
RBC: 5.01 M/uL (ref 3.87–5.11)
Total Bilirubin: 0.3 mg/dL (ref 0.3–1.2)
WBC: 13.6 10*3/uL — ABNORMAL HIGH (ref 4.0–10.5)

## 2007-10-20 ENCOUNTER — Encounter (INDEPENDENT_AMBULATORY_CARE_PROVIDER_SITE_OTHER): Payer: Self-pay | Admitting: Family Medicine

## 2007-11-13 ENCOUNTER — Ambulatory Visit: Payer: Self-pay | Admitting: Family Medicine

## 2007-11-13 ENCOUNTER — Encounter (INDEPENDENT_AMBULATORY_CARE_PROVIDER_SITE_OTHER): Payer: Self-pay | Admitting: Family Medicine

## 2007-11-13 LAB — CONVERTED CEMR LAB
Glucose, Urine, Semiquant: NEGATIVE
Specific Gravity, Urine: >=1.03
pH: 5.5

## 2008-01-28 ENCOUNTER — Encounter: Admission: RE | Admit: 2008-01-28 | Discharge: 2008-01-28 | Payer: Self-pay | Admitting: Family Medicine

## 2008-01-28 ENCOUNTER — Encounter (INDEPENDENT_AMBULATORY_CARE_PROVIDER_SITE_OTHER): Payer: Self-pay | Admitting: Family Medicine

## 2008-01-28 ENCOUNTER — Ambulatory Visit: Payer: Self-pay | Admitting: Family Medicine

## 2008-01-28 LAB — CONVERTED CEMR LAB
AST: 17 units/L (ref 0–37)
Alkaline Phosphatase: 83 units/L (ref 39–117)
BUN: 15 mg/dL (ref 6–23)
Calcium: 9.2 mg/dL (ref 8.4–10.5)
Creatinine, Ser: 1.09 mg/dL (ref 0.40–1.20)
Total Bilirubin: 0.3 mg/dL (ref 0.3–1.2)

## 2008-01-30 DIAGNOSIS — M169 Osteoarthritis of hip, unspecified: Secondary | ICD-10-CM

## 2008-01-30 DIAGNOSIS — M1610 Unilateral primary osteoarthritis, unspecified hip: Secondary | ICD-10-CM | POA: Insufficient documentation

## 2008-02-02 ENCOUNTER — Telehealth: Payer: Self-pay | Admitting: *Deleted

## 2008-02-12 ENCOUNTER — Telehealth: Payer: Self-pay | Admitting: *Deleted

## 2008-02-19 ENCOUNTER — Encounter (INDEPENDENT_AMBULATORY_CARE_PROVIDER_SITE_OTHER): Payer: Self-pay | Admitting: Family Medicine

## 2008-03-22 ENCOUNTER — Telehealth (INDEPENDENT_AMBULATORY_CARE_PROVIDER_SITE_OTHER): Payer: Self-pay | Admitting: Family Medicine

## 2008-03-30 ENCOUNTER — Telehealth: Payer: Self-pay | Admitting: *Deleted

## 2008-04-20 ENCOUNTER — Telehealth (INDEPENDENT_AMBULATORY_CARE_PROVIDER_SITE_OTHER): Payer: Self-pay | Admitting: Family Medicine

## 2008-04-20 ENCOUNTER — Ambulatory Visit: Payer: Self-pay | Admitting: Family Medicine

## 2008-04-20 LAB — CONVERTED CEMR LAB
Bilirubin Urine: NEGATIVE
Ketones, urine, test strip: NEGATIVE
Protein, U semiquant: >=300
Urobilinogen, UA: 0.2

## 2008-04-21 ENCOUNTER — Encounter (INDEPENDENT_AMBULATORY_CARE_PROVIDER_SITE_OTHER): Payer: Self-pay | Admitting: Family Medicine

## 2008-04-26 ENCOUNTER — Telehealth: Payer: Self-pay | Admitting: *Deleted

## 2008-04-28 ENCOUNTER — Encounter (INDEPENDENT_AMBULATORY_CARE_PROVIDER_SITE_OTHER): Payer: Self-pay | Admitting: Family Medicine

## 2008-05-05 ENCOUNTER — Encounter (INDEPENDENT_AMBULATORY_CARE_PROVIDER_SITE_OTHER): Payer: Self-pay | Admitting: Family Medicine

## 2008-05-27 ENCOUNTER — Inpatient Hospital Stay (HOSPITAL_COMMUNITY): Admission: EM | Admit: 2008-05-27 | Discharge: 2008-05-27 | Payer: Self-pay | Admitting: *Deleted

## 2008-05-27 ENCOUNTER — Encounter: Payer: Self-pay | Admitting: Family Medicine

## 2008-06-01 ENCOUNTER — Encounter (INDEPENDENT_AMBULATORY_CARE_PROVIDER_SITE_OTHER): Payer: Self-pay | Admitting: Family Medicine

## 2008-06-08 ENCOUNTER — Ambulatory Visit: Payer: Self-pay | Admitting: Family Medicine

## 2008-06-21 ENCOUNTER — Telehealth: Payer: Self-pay | Admitting: *Deleted

## 2008-07-07 ENCOUNTER — Ambulatory Visit: Payer: Self-pay | Admitting: Family Medicine

## 2008-07-07 ENCOUNTER — Encounter (INDEPENDENT_AMBULATORY_CARE_PROVIDER_SITE_OTHER): Payer: Self-pay | Admitting: Family Medicine

## 2008-07-14 LAB — CONVERTED CEMR LAB
ALT: 9 units/L (ref 0–35)
AST: 15 units/L (ref 0–37)
Calcium: 9.3 mg/dL (ref 8.4–10.5)
Chloride: 106 meq/L (ref 96–112)
Creatinine, Ser: 1.02 mg/dL (ref 0.40–1.20)
HCT: 43.3 % (ref 36.0–46.0)
Platelets: 316 10*3/uL (ref 150–400)
RDW: 15.8 % — ABNORMAL HIGH (ref 11.5–15.5)
Total Bilirubin: 0.2 mg/dL — ABNORMAL LOW (ref 0.3–1.2)

## 2008-07-20 ENCOUNTER — Ambulatory Visit: Payer: Self-pay | Admitting: Family Medicine

## 2008-07-20 ENCOUNTER — Inpatient Hospital Stay (HOSPITAL_COMMUNITY): Admission: EM | Admit: 2008-07-20 | Discharge: 2008-07-22 | Payer: Self-pay | Admitting: Emergency Medicine

## 2008-07-20 ENCOUNTER — Encounter: Payer: Self-pay | Admitting: Family Medicine

## 2008-07-30 ENCOUNTER — Ambulatory Visit: Payer: Self-pay | Admitting: Family Medicine

## 2008-08-10 ENCOUNTER — Telehealth: Payer: Self-pay | Admitting: Family Medicine

## 2008-09-14 ENCOUNTER — Telehealth: Payer: Self-pay | Admitting: *Deleted

## 2008-10-13 ENCOUNTER — Telehealth: Payer: Self-pay | Admitting: *Deleted

## 2008-10-22 ENCOUNTER — Telehealth: Payer: Self-pay | Admitting: *Deleted

## 2008-11-16 ENCOUNTER — Ambulatory Visit: Payer: Self-pay | Admitting: Family Medicine

## 2008-11-30 ENCOUNTER — Encounter: Payer: Self-pay | Admitting: Family Medicine

## 2008-12-05 ENCOUNTER — Emergency Department (HOSPITAL_COMMUNITY): Admission: EM | Admit: 2008-12-05 | Discharge: 2008-12-06 | Payer: Self-pay | Admitting: Emergency Medicine

## 2008-12-08 ENCOUNTER — Encounter: Payer: Self-pay | Admitting: Family Medicine

## 2008-12-13 ENCOUNTER — Telehealth: Payer: Self-pay | Admitting: Family Medicine

## 2008-12-21 ENCOUNTER — Telehealth: Payer: Self-pay | Admitting: *Deleted

## 2008-12-27 ENCOUNTER — Ambulatory Visit: Payer: Self-pay | Admitting: Family Medicine

## 2008-12-27 ENCOUNTER — Encounter: Payer: Self-pay | Admitting: Family Medicine

## 2008-12-27 LAB — CONVERTED CEMR LAB
Casts: >20 /lpf
Ketones, urine, test strip: NEGATIVE
Nitrite: NEGATIVE
Protein, U semiquant: >=300
Urobilinogen, UA: 0.2

## 2008-12-28 LAB — CONVERTED CEMR LAB
ALT: <8 units/L (ref 0–35)
Alkaline Phosphatase: 73 units/L (ref 39–117)
Sodium: 139 meq/L (ref 135–145)
Total Bilirubin: 0.2 mg/dL — ABNORMAL LOW (ref 0.3–1.2)
Total Protein: 6.4 g/dL (ref 6.0–8.3)

## 2009-01-05 ENCOUNTER — Encounter: Payer: Self-pay | Admitting: Family Medicine

## 2009-02-09 ENCOUNTER — Telehealth: Payer: Self-pay | Admitting: Family Medicine

## 2009-03-15 ENCOUNTER — Telehealth: Payer: Self-pay | Admitting: Family Medicine

## 2009-04-12 ENCOUNTER — Telehealth: Payer: Self-pay | Admitting: Family Medicine

## 2009-04-14 ENCOUNTER — Telehealth: Payer: Self-pay | Admitting: Family Medicine

## 2009-04-15 ENCOUNTER — Encounter: Payer: Self-pay | Admitting: Family Medicine

## 2009-05-13 ENCOUNTER — Telehealth: Payer: Self-pay | Admitting: Family Medicine

## 2009-06-07 ENCOUNTER — Encounter: Payer: Self-pay | Admitting: Family Medicine

## 2009-06-14 ENCOUNTER — Telehealth: Payer: Self-pay | Admitting: Family Medicine

## 2009-07-07 ENCOUNTER — Ambulatory Visit: Payer: Self-pay | Admitting: Family Medicine

## 2009-08-05 ENCOUNTER — Telehealth: Payer: Self-pay | Admitting: Family Medicine

## 2009-09-05 ENCOUNTER — Encounter: Payer: Self-pay | Admitting: Family Medicine

## 2009-09-07 ENCOUNTER — Telehealth: Payer: Self-pay | Admitting: Family Medicine

## 2009-09-12 ENCOUNTER — Telehealth: Payer: Self-pay | Admitting: Family Medicine

## 2009-09-13 ENCOUNTER — Emergency Department (HOSPITAL_COMMUNITY): Admission: EM | Admit: 2009-09-13 | Discharge: 2009-09-14 | Payer: Self-pay | Admitting: Emergency Medicine

## 2009-09-16 ENCOUNTER — Ambulatory Visit: Payer: Self-pay | Admitting: Family Medicine

## 2009-09-21 ENCOUNTER — Ambulatory Visit (HOSPITAL_COMMUNITY): Admission: RE | Admit: 2009-09-21 | Discharge: 2009-09-21 | Payer: Self-pay | Admitting: Family Medicine

## 2009-09-22 ENCOUNTER — Encounter: Payer: Self-pay | Admitting: Family Medicine

## 2009-09-22 ENCOUNTER — Telehealth: Payer: Self-pay | Admitting: Family Medicine

## 2009-09-30 ENCOUNTER — Ambulatory Visit: Payer: Self-pay | Admitting: Critical Care Medicine

## 2009-09-30 ENCOUNTER — Encounter: Payer: Self-pay | Admitting: Family Medicine

## 2009-09-30 DIAGNOSIS — I5043 Acute on chronic combined systolic (congestive) and diastolic (congestive) heart failure: Secondary | ICD-10-CM | POA: Insufficient documentation

## 2009-09-30 DIAGNOSIS — I5042 Chronic combined systolic (congestive) and diastolic (congestive) heart failure: Secondary | ICD-10-CM | POA: Insufficient documentation

## 2009-09-30 DIAGNOSIS — I5032 Chronic diastolic (congestive) heart failure: Secondary | ICD-10-CM | POA: Insufficient documentation

## 2009-10-03 ENCOUNTER — Telehealth: Payer: Self-pay | Admitting: Family Medicine

## 2009-10-12 ENCOUNTER — Telehealth: Payer: Self-pay | Admitting: Critical Care Medicine

## 2009-10-13 ENCOUNTER — Telehealth: Payer: Self-pay | Admitting: Critical Care Medicine

## 2009-10-18 ENCOUNTER — Telehealth: Payer: Self-pay | Admitting: Critical Care Medicine

## 2009-10-18 ENCOUNTER — Encounter: Payer: Self-pay | Admitting: Critical Care Medicine

## 2009-10-25 ENCOUNTER — Encounter: Payer: Self-pay | Admitting: Family Medicine

## 2009-10-27 ENCOUNTER — Telehealth: Payer: Self-pay | Admitting: Family Medicine

## 2009-11-21 ENCOUNTER — Encounter: Payer: Self-pay | Admitting: Family Medicine

## 2009-11-22 ENCOUNTER — Ambulatory Visit: Payer: Self-pay | Admitting: Family Medicine

## 2009-11-22 ENCOUNTER — Encounter: Payer: Self-pay | Admitting: Family Medicine

## 2009-11-22 DIAGNOSIS — N3000 Acute cystitis without hematuria: Secondary | ICD-10-CM | POA: Insufficient documentation

## 2009-11-22 DIAGNOSIS — M79609 Pain in unspecified limb: Secondary | ICD-10-CM | POA: Insufficient documentation

## 2009-11-22 LAB — CONVERTED CEMR LAB
CO2: 22 meq/L (ref 19–32)
Calcium: 8.9 mg/dL (ref 8.4–10.5)
Chloride: 105 meq/L (ref 96–112)
Creatinine, Ser: 0.89 mg/dL (ref 0.40–1.20)
Glucose, Bld: 95 mg/dL (ref 70–99)

## 2009-11-25 ENCOUNTER — Encounter: Payer: Self-pay | Admitting: Family Medicine

## 2009-12-02 ENCOUNTER — Telehealth: Payer: Self-pay | Admitting: Family Medicine

## 2009-12-10 ENCOUNTER — Emergency Department (HOSPITAL_COMMUNITY): Admission: EM | Admit: 2009-12-10 | Discharge: 2009-12-10 | Payer: Self-pay | Admitting: Emergency Medicine

## 2009-12-15 ENCOUNTER — Encounter: Payer: Self-pay | Admitting: Family Medicine

## 2009-12-15 ENCOUNTER — Ambulatory Visit: Payer: Self-pay | Admitting: Family Medicine

## 2009-12-15 DIAGNOSIS — R3129 Other microscopic hematuria: Secondary | ICD-10-CM | POA: Insufficient documentation

## 2009-12-15 DIAGNOSIS — R259 Unspecified abnormal involuntary movements: Secondary | ICD-10-CM

## 2009-12-15 DIAGNOSIS — G252 Other specified forms of tremor: Secondary | ICD-10-CM | POA: Insufficient documentation

## 2009-12-15 LAB — CONVERTED CEMR LAB
Nitrite: NEGATIVE
Protein, U semiquant: >=300
Urobilinogen, UA: 0.2

## 2009-12-30 ENCOUNTER — Encounter: Payer: Self-pay | Admitting: Family Medicine

## 2010-01-17 ENCOUNTER — Telehealth: Payer: Self-pay | Admitting: Family Medicine

## 2010-01-31 ENCOUNTER — Encounter: Payer: Self-pay | Admitting: Family Medicine

## 2010-01-31 ENCOUNTER — Ambulatory Visit
Admission: RE | Admit: 2010-01-31 | Discharge: 2010-01-31 | Payer: Self-pay | Source: Home / Self Care | Attending: Gynecology | Admitting: Gynecology

## 2010-01-31 ENCOUNTER — Other Ambulatory Visit
Admission: RE | Admit: 2010-01-31 | Discharge: 2010-01-31 | Payer: Self-pay | Source: Home / Self Care | Admitting: Gynecology

## 2010-02-02 ENCOUNTER — Encounter: Payer: Self-pay | Admitting: Family Medicine

## 2010-02-05 ENCOUNTER — Encounter: Payer: Self-pay | Admitting: Sports Medicine

## 2010-02-06 ENCOUNTER — Encounter: Payer: Self-pay | Admitting: Family Medicine

## 2010-02-09 ENCOUNTER — Encounter (INDEPENDENT_AMBULATORY_CARE_PROVIDER_SITE_OTHER): Payer: Self-pay | Admitting: *Deleted

## 2010-02-12 ENCOUNTER — Emergency Department (HOSPITAL_COMMUNITY)
Admission: EM | Admit: 2010-02-12 | Discharge: 2010-02-13 | Payer: Self-pay | Source: Home / Self Care | Admitting: Emergency Medicine

## 2010-02-13 LAB — BASIC METABOLIC PANEL
BUN: 8 mg/dL (ref 6–23)
CO2: 23 mEq/L (ref 19–32)
Chloride: 104 mEq/L (ref 96–112)
Creatinine, Ser: 0.9 mg/dL (ref 0.4–1.2)
Glucose, Bld: 82 mg/dL (ref 70–99)

## 2010-02-14 NOTE — Consult Note (Signed)
Summary: [facility]  [facility]   Imported By: Beryle Lathe 06/22/2009 17:16:53  _____________________________________________________________________  External Attachment:    Type:   Image     Comment:   External Document  Appended Document: [facility]    Clinical Lists Changes  Observations: Added new observation of CHIEF CMPLNT: Back Pain (06/27/2009 9:26) Added new observation of PRIMARY MD: Eugenie Norrie  MD (06/27/2009 9:26)

## 2010-02-14 NOTE — Assessment & Plan Note (Signed)
Summary: f/up,tcb   Vital Signs:  Patient profile:   65 year old female Weight:      215.2 pounds Temp:     97.9 degrees F oral Pulse rate:   80 / minute Pulse rhythm:   regular BP sitting:   137 / 83  (left arm) Cuff size:   large  Vitals Entered By: Audelia Hives CMA (July 07, 2009 2:00 PM)  meds called into [facility].Audelia Hives CMA  July 07, 2009 4:03 PM   Primary Care Provider:  Eugenie Norrie  MD   History of Present Illness: 1. weakness This is a persistent issue. Has trouble ambulating at times. Uses a walker in the house. States her ambulation is at baseline. Occasionally feels dizzy but her orthostatic hypotension is stable per her report. Has CBC, CMET and Lipids pender per dr. Rollene Fare.   2. Hyperlipidemia taking medications: yes, crestor problems with medications: denis.  subjective: last LDL 12/10 70; taking crestor now; is scheduled to have CMP, LIPIDS and CBC with Dr. Lowella Fairy office  3. dysuria  No problems with this at this time. Incontinent and wears diapers. Has HH aid 1.5 hours 5 times a week but otherwise is independent with ADLs.   ROS: fevers:  no  chills: no    nausea:  occasionally   vomiting: no     diarrhea: no     RUQ pain: no    muslce/joint aches: no appetite: normal.   Current Medications (verified): 1)  Clozapine 100 Mg Tabs (Clozapine) .... One Qam, One At 3pm, and 2 At 7pm (Per Mental Health) 2)  Valium 5 Mg Tabs (Diazepam) .... Take 1 Tablet By Mouth At Bedtime 3)  Adult Aspirin Ec Low Strength 81 Mg  Tbec (Aspirin) .... One Daily 4)  Tums Ultra 1000 Mg  Chew (Calcium Carbonate Antacid) .... One Two Times A Day 5)  Albuterol 90 Mcg/act  Aers (Albuterol) .... 2 Puffs Every 4 Hours As Needed For Wheezing and Shortness of Breath 6)  B-2 Extra High Comp Hose Women  Misc (Elastic Bandages & Supports) .... Wear Daily. Dispense One Set 7)  Crestor 5 Mg Tabs (Rosuvastatin Calcium) .... One By Mouth Daily 8)  Vicodin 5-500 Mg  Tabs (Hydrocodone-Acetaminophen) .... 1/2 To One Tablet Two Times A Day As Needed Pain. Do Not Take With Tylenol 9)  Prilosec 20 Mg Cpdr (Omeprazole) .... One By Mouth Daily (Per Cards) 10)  Midodrine Hcl 2.5 Mg Tabs (Midodrine Hcl) .... 2 Daily (Per Cards) 11)  Toprol Xl 25 Mg Xr24h-Tab (Metoprolol Succinate) .... One Daily (Per Cards)  Allergies (verified): 1)  ! Codeine 2)  ! Haldol 3)  ! * Cogentin  Past History:  Past Surgical History: BTL-`72  Cholecystectomy - 10/30/2001 cystoscopy--no tumor/stone - 07/03/1999 s/p int. fixation for right arm fx  Tonsillectomy - cardiolite 5/10 neg for ischemia  Social History: ex-smoker, quit 2007, no drugs, no Etoh.  Sister very involved with medical management; calls for updates after OVs.  Pt doesn not drive; has limited transportation access.  Rides SC[employer] van to OVs most of time.  Sister very involved with care and visits her daily. Son Cathleen Corti) also very involved. Currently living at home. Has difficulty making it to the bathroom and wears a diaper. Per her report can clean herself and attend to other ADLs. Adquate hydration with orthostatic hypotension is an ongoing issue.  Has an aid for 1.5 hours 5 days per week.   Has 5 grandkids -- youngest just  recently born  Review of Systems       review of systems as noted in HPI section   Physical Exam  General:  General:  Vital signs reviewed -- sitting in wheelchair Alert, appropriate; well-dressed and well-nourished Lungs:  work of breathing unlabored, clear to auscultation bilaterally; no wheezes, rales, or ronchi; good air movement throughout Heart:  regular rate and rhythm, no murmurs; normal s1/s2  Neurologic:  alert and oriented. speech normal. Some mild memory difficulty.    Impression & Recommendations:  Problem # 1:  WEAKNESS (ICD-780.79) Assessment Unchanged  chronic issue. I do not feel a large w/u at this point would be very revealing. Will wait for CBC, CMET and lipids  from Dr. Lowella Fairy office.   Orders: [place]- Est Level  3 [id number])  Problem # 2:  HYPERCHOLESTEROLEMIA (ICD-272.0)  now on crestor. follow-up lipid panel per Dr. Lowella Fairy office.  Her updated medication list for this problem includes:    Crestor 5 Mg Tabs (Rosuvastatin calcium) ..... One by mouth daily  Orders: [facility]- Est Level  3 [id number])  Problem # 3:  DYSURIA (ICD-788.1) Assessment: Improved  improved. follow.   Orders: [place]- Est Level  3 [id number])  Complete Medication List: 1)  Clozapine 100 Mg Tabs (Clozapine) .... One qam, one at 3pm, and 2 at 7pm (per mental health) 2)  Valium 5 Mg Tabs (Diazepam) .... Take 1 tablet by mouth at bedtime 3)  Adult Aspirin Ec Low Strength 81 Mg Tbec (Aspirin) .... One daily 4)  Tums Ultra 1000 Mg Chew (Calcium carbonate antacid) .... One two times a day 5)  Albuterol 90 Mcg/act Aers (Albuterol) .... 2 puffs every 4 hours as needed for wheezing and shortness of breath 6)  B-2 Extra High Comp Hose Women Misc (Elastic bandages & supports) .... Wear daily. dispense one set 7)  Crestor 5 Mg Tabs (Rosuvastatin calcium) .... One by mouth daily 8)  Vicodin 5-500 Mg Tabs (Hydrocodone-acetaminophen) .... 1/2 to one tablet two times a day as needed pain. do not take with tylenol 9)  Prilosec 20 Mg Cpdr (Omeprazole) .... One by mouth daily (per cards) 10)  Midodrine Hcl 2.5 Mg Tabs (Midodrine hcl) .... 2 daily (per cards) 11)  Toprol Xl 25 Mg Xr24h-tab (Metoprolol succinate) .... One daily (per cards)  Other Orders: Home Health Referral ([place])  Patient Instructions: 1)  have Dr. Lowella Fairy office fax a copy of the lab results when you go -- he's testing your liver, blood counts and cholesterol. 2)  Call for any problems.  Prescriptions: VICODIN 5-500 MG TABS (HYDROCODONE-ACETAMINOPHEN) 1/2 to one tablet two times a day as needed pain. DO NOT TAKE WITH TYLENOL  #60 x 0   Entered and Authorized by:   Eugenie Norrie  MD   Signed by:   Eugenie Norrie  MD on 07/07/2009   Method used:   Print then Give to Patient   RxID:   [id number] CRESTOR 5 MG TABS (ROSUVASTATIN CALCIUM) one by mouth daily  #30 x 2   Entered and Authorized by:   Eugenie Norrie  MD   Signed by:   Eugenie Norrie  MD on 07/07/2009   Method used:   Print then Give to Patient   RxID:   [id number]

## 2010-02-14 NOTE — Miscellaneous (Signed)
Summary: Updating problem list   Clinical Lists Changes  Problems: Removed problem of RENAL INSUFFICIENCY, CHRONIC (ICD-585.9) Added new problem of CHRONIC KIDNEY DISEASE STAGE II (MILD) (ICD-585.2)

## 2010-02-14 NOTE — Miscellaneous (Signed)
Summary: Update problem list  Clinical Lists Changes  Problems: Removed problem of FAILURE, DIASTOLIC HEART, CHRONIC (0000000) Added new problem of CHRONIC DIASTOLIC HEART FAILURE (0000000)

## 2010-02-14 NOTE — Miscellaneous (Signed)
Summary: Update to problem list  Clinical Lists Changes  Problems: Removed problem of DYSURIA (ICD-788.1) Removed problem of ONYCHOMYCOSIS, TOENAILS (ICD-110.1) Removed problem of ENCOUNTER FOR LONG-TERM USE OF OTHER MEDICATIONS (ICD-V58.69) Removed problem of OBESITY, UNSPECIFIED (ICD-278.00) Removed problem of INCONTINENCE, URGE (ICD-788.31) Removed problem of POSTMENOPAUSAL STATUS (ICD-V49.81) Removed problem of ORTHOSTATIC HYPOTENSION (ICD-458.0) Removed problem of CYSTOCELE WITH INCOMPLETE UTERINE PROLAPSE (ICD-618.2) Removed problem of OBESITY, UNSPECIFIED (ICD-278.00) Removed problem of TOBACCO ABUSE (ICD-305.1) Removed problem of WEAKNESS (ICD-780.79)

## 2010-02-14 NOTE — Progress Notes (Signed)
Summary: returned call from PW  Phone Note Call from Patient   Caller: Daughter-Abigail Wallace Call For: WRIGHT Summary of Call: pt's daughter returned call from dr Joya Gaskins (minutes ago). [phone] Initial call taken by: Cooper Render, CNA,  October 13, 2009 9:01 AM  Follow-up for Phone Call        Spoke with Abigail, pt's daughter, she states that PW just called her and she could not get to the phone.  Would like a call back this am if possible. Her # is [phone] Follow-up by: Tilden Dome,  October 13, 2009 10:16 AM  Additional Follow-up for Phone Call Additional follow up Details #1::        done Additional Follow-up by: Elsie Stain MD,  October 13, 2009 10:54 AM

## 2010-02-14 NOTE — Letter (Signed)
Summary: Generic Letter  [facility]  [address]   [place], [place] [address]   Phone: [phone]  Fax: [phone]    04/15/2009  [place], [place]  [address]  To Whom It May Concern:  Please be advised that the above-named patient is under my care for COPD, orthostatic hypotension, osteoarthritis, incontinence among other medical conditions. Due to her medical problems, she would continue to benefit from a home health home aid. Thank you for your consideration.   Sincerely,    Eugenie Norrie  MD

## 2010-02-14 NOTE — Assessment & Plan Note (Signed)
Summary: OV Cystitis, Smoking, Hyperlipidemia   Vital Signs:  Patient profile:   65 year old female Height:      66 inches Weight:      214 pounds BMI:     34.67 Temp:     98.2 degrees F oral BP sitting:   145 / 81  (left arm) Cuff size:   regular  Vitals Entered By: Schuyler Amor CMA (November 22, 2009 10:32 AM) CC: F/U Is Patient Diabetic? No Pain Assessment Patient in pain? no        Referring Provider:  Dr. Darron Doom Primary Provider:  Karen Kays MD  CC:  F/U.  History of Present Illness:  1. Diastolic CHF Cardiologist: Dr. Rollene Fare @ [facility]. Next appt on Monday (11/28/2009). Concern about pacemaker malfunctioning. No problems at this time, but just concerned. Patient taking midodrine as prescribed per Cards.  ROS:   + Dyspnea (but c/o of this for past few months; seeing pulmonologist; lung nodule found, not worrisome at this time, will get follow-up x-rays in few years)  Denies chest pain, palpitations Recorded home BPs [phone].   2. HLD  Taking Crestor.  3. Left ankle swelling and left 5th toe pain. Noticed past several days. Concerned about infection about needing tetanus shot and fear of getting foot amputated.   4. Schizophrenia Patient says she feels she just has anxiety and not schizophrenia. Denies auditory or visual hallucinations, thoughts of hurting self or others.  5. Smoking Not wanting to quit at this time. Smoking helps with anxiety.   6. Dysuria & frequency Past few days. Denies blood in urine or fevers.   Allergies: 1)  ! Codeine 2)  ! Haldol 3)  ! * Cogentin  Social History: Current smoker 1/2 ppd x 30 yrs, no drugs, no Etoh.   Sister very involved with medical management; calls for updates after OVs [phone]). Son Cathleen Corti) also very involved. Pt doesn not drive; has limited transportation access.  Rides [employer] van to OVs most of time.   Currently living at home. Has difficulty making it to the  bathroom and wears a diaper. Per her report can clean herself and attend to other ADLs. Uses walker.  Has an aid for 1.5 hours 5 days per week.   Has 5 grandkids -- youngest just recently born 37 children  Physical Exam  General:  alert and well-developed.   Neck:  no masses.   Lungs:  normal respiratory effort and no accessory muscle use, mild diffuse end inspiratory wheezing, no crackles  Heart:  normal rate, regular rhythm, and no murmur.   Msk:  mild lumbar tenderness over R hip, no mid-line back tenderness; difficult to assess hip ROM based on lmiited motility (PE conducted with patient in her wheelchair).   Extremities:  2+ DP pulses bilaterally, ?mild pedal edema bilaterally, no pretibial edema Psych:  memory intact for recent and remote, normally interactive, good eye contact, not depressed appearing, not suicidal, not homicidal, and slightly anxious.     Impression & Recommendations:  Problem # 1:  CHRONIC DIASTOLIC HEART FAILURE (0000000) Assessment Unchanged  Reassured patient that Cardiologist will make sure her pacemaker is working properly at each visit. Will follow-up with her appointment with Dr. Rollene Fare.   Orders: [place]- Est Level  3 ([id number])  Problem # 2:  PAIN IN SOFT TISSUES OF LIMB (ICD-729.5) Assessment: New  Left foot pain and swelling. Not appears worrisome with no tenderness and maybe mild left pedal edema  that is not different from right foot. Reassured patient that the foot does not appear infected, that she does not need tetanus shot, and that she definitely does not need an amputation. But told her to call clinic if she has fevers and foot pain worsens over next several days. Patient has a h/o anxiety and feel this is just component of her anxiety.   Orders: [place]- Est Level  3 [id number])  Problem # 3:  SCHIZOPHRENIA (ICD-295.90) Assessment: Improved  Patient is anxious today but not schizophrenic. Seeing Dr. Judieth Keens. Saw him one month ago and next  appointment is 11/28/2009. On Cloazpine scheduled and Valium as needed.   Orders: [place]- Est Level  3 ([id number])  Problem # 4:  HYPERCHOLESTEROLEMIA (ICD-272.0) Assessment: Improved LDL good today. Continue Crestor.   Her updated medication list for this problem includes:    Crestor 5 Mg Tabs (Rosuvastatin calcium) ..... One by mouth daily  Orders: Direct LDL-[facility] [id number]) [place]- Est Level  3 [id number])  Problem # 5:  CHRONIC KIDNEY DISEASE STAGE II (MILD) (ICD-585.2) This may be incorrect diagnosis based on most recent Cr being normal. Cr today is also normal. Will remove from problem list.   Orders: Basic Met-[facility] [id number])  Problem # 6:  OSTEOARTHRITIS, HIP, RIGHT (ICD-715.95) Assessment: Unchanged  Well controlled on Vicodin. Only takes 2 tablets daily. Will re-fill. Patient contract discussed and signed today. Will call in Vicodin for her now for pick-up on November 13. Will only re-fill monthly.   Her updated medication list for this problem includes:    Vicodin 5-500 Mg Tabs (Hydrocodone-acetaminophen) .Marland Kitchen... 1/2 to one tablet two times a day as needed pain. do not take with tylenol: ok to refill on monthly schedule.  Orders: [place]- Est Level  3 [id number])  Problem # 7:  Preventive Health Care (ICD-V70.0) Given flu shot.   Problem # 8:  ACUTE CYSTITIS (ICD-595.0) Unable to do clean catch but cystitis likely based on symptoms. Will treat with antibiotics.   Her updated medication list for this problem includes:    Macrobid 100 Mg Caps (Nitrofurantoin monohyd macro) .Marland Kitchen... Take 1 pill twice a day for 7 days for your urine infection.  Complete Medication List: 1)  Clozapine 100 Mg Tabs (Clozapine) .... One qam, one at 3pm, and 2 at 7pm (per mental health) 2)  Valium 5 Mg Tabs (Diazepam) .... Take 1 tablet by mouth at bedtime 3)  Tums Ultra 1000 1000 Mg Chew (Calcium carbonate antacid) 4)  Crestor 5 Mg Tabs (Rosuvastatin calcium) .... One by mouth daily 5)  Vicodin 5-500 Mg  Tabs (Hydrocodone-acetaminophen) .... 1/2 to one tablet two times a day as needed pain. do not take with tylenol: ok to refill on monthly schedule 6)  Prilosec 20 Mg Cpdr (Omeprazole) .... One by mouth daily (per cards) 7)  Midodrine Hcl 2.5 Mg Tabs (Midodrine hcl) .... 2 daily (per cards) 8)  Spiriva Handihaler 18 Mcg Caps (Tiotropium bromide monohydrate) .... Two puffs in handihaler daily 9)  Nicotrol 10 Mg Inha (Nicotine) .... Use 60-80 puffs per cartridge, use 6-8 cartridges per day 10)  Macrobid 100 Mg Caps (Nitrofurantoin monohyd macro) .... Take 1 pill twice a day for 7 days for your urine infection.  Other Orders: Flu Vaccine 56yrs + [id number]) Admin 1st Vaccine [id number]) Prescriptions: MACROBID 100 MG CAPS (NITROFURANTOIN MONOHYD MACRO) Take 1 pill twice a day for 7 days for your urine infection.  #14 x 0   Entered and Authorized by:   [place]  MD   Signed by:   Sheral Flow Park MD on 11/22/2009   Method used:   Electronically to        [id number] (retail)       [address], [place]  [address]       Ph: [phone]       Fax: [phone]   RxID:   [id number] PRILOSEC 20 MG CPDR (OMEPRAZOLE) one by mouth daily (per cards)  #34 x 3   Entered and Authorized by:   Karen Kays MD   Signed by:   Sheral Flow Park MD on 11/22/2009   Method used:   Electronically to        [facility] (retail)       [address], [place]  [address]       Ph: [phone]       Fax: [phone]   RxID:   [id number] CRESTOR 5 MG TABS (ROSUVASTATIN CALCIUM) one by mouth daily  #30 x 3   Entered and Authorized by:   Karen Kays MD   Signed by:   Sheral Flow Park MD on 11/22/2009   Method used:   Electronically to        [facility] (retail)       [address]       [place], [place]  [address]       Ph: [phone]       Fax: [phone]   RxID:   [id number]    Orders Added: 1)  Basic Met-[facility] [phone] 2)  Direct LDL-[facility] [phone] 3)  Flu  Vaccine 2yrs + [id number] 4)  Admin 1st Vaccine [id number] 5)  [place]- Est Level  3 [id number]   Immunizations Administered:  Influenza Vaccine # 1:    Vaccine Type: Fluvax 3+    Site: left deltoid    Mfr: GlaxoSmithKline    Dose: 0.5 ml    Route: IM    Given by: Christen Bame CMA    Exp. Date: 07/15/2010    Lot #: [id number]    VIS given: 08/09/09 version given November 22, 2009.  Flu Vaccine Consent Questions:    Do you have a history of severe allergic reactions to this vaccine? no    Any prior history of allergic reactions to egg and/or gelatin? no    Do you have a sensitivity to the preservative Thimersol? no    Do you have a past history of Guillan-Barre Syndrome? no    Do you currently have an acute febrile illness? no    Have you ever had a severe reaction to latex? no    Vaccine information given and explained to patient? yes    Are you currently pregnant? no   Immunizations Administered:  Influenza Vaccine # 1:    Vaccine Type: Fluvax 3+    Site: left deltoid    Mfr: GlaxoSmithKline    Dose: 0.5 ml    Route: IM    Given by: Christen Bame CMA    Exp. Date: 07/15/2010    Lot #: [id number]    VIS given: 08/09/09 version given November 22, 2009.

## 2010-02-14 NOTE — Progress Notes (Signed)
Summary: re: pt condition--would like to speak to PW  Phone Note Call from Patient   Caller: Daughter-tammy simmons  Call For: wright Summary of Call: daughter requests to speak to nurse re: pt/ nodules. says they have signed designated party release. [phone] Initial call taken by: Cooper Render, CNA,  October 12, 2009 2:00 PM  Follow-up for Phone Call        called and spoke with pt's daughter, Lynelle Smoke.  Tammy would like to speak to Dr. Joya Gaskins regarding pt's condition and her pulmonary nodules.  Informed pt PW out of the office today and will return tomorrow. Tammy ok with this.  will forward message to PW to address.  Matthew Folks LPN  September 28, 624THL 4:26 PM   Additional Follow-up for Phone Call Additional follow up Details #1::        done Additional Follow-up by: Elsie Stain MD,  October 13, 2009 8:52 AM

## 2010-02-14 NOTE — Progress Notes (Signed)
Summary: refill  Phone Note Refill Request Call back at Home Phone [phone] Message from:  Patient  Refills Requested: Medication #1:  VICODIN 5-500 MG TABS 1/2 to one tablet two times a day as needed pain. DO NOT TAKE WITH TYLENOL: OK to refill on monthly schedule Initial call taken by: Audie Clear,  December 02, 2009 1:35 PM  Follow-up for Phone Call        will forward to Dr. Andria Frames preceptor today Follow-up by: Marcell Barlow RN,  December 02, 2009 3:31 PM  Additional Follow-up for Phone Call Additional follow up Details #1::        Done.  Please notify patient   Additional Follow-up by: Madison Hickman MD,  December 02, 2009 4:04 PM    New/Updated Medications: VICODIN 5-500 MG TABS (HYDROCODONE-ACETAMINOPHEN) 1/2 to one tablet two times a day as needed pain. DO NOT TAKE WITH TYLENOL: OK to refill on monthly schedule Prescriptions: VICODIN 5-500 MG TABS (HYDROCODONE-ACETAMINOPHEN) 1/2 to one tablet two times a day as needed pain. DO NOT TAKE WITH TYLENOL: OK to refill on monthly schedule  #30 x 5   Entered and Authorized by:   Madison Hickman MD   Signed by:   Madison Hickman MD on 12/02/2009   Method used:   Printed then faxed to ...         [id number]  patient notified. Marland Kitchen Rx faxed. Marcell Barlow RN  December 02, 2009 4:11 PM

## 2010-02-14 NOTE — Miscellaneous (Signed)
Summary: Controlled Substance Contract  Controlled Substance Contract   Imported By: Audie Clear 12/02/2009 16:18:53  _____________________________________________________________________  External Attachment:    Type:   Image     Comment:   External Document

## 2010-02-14 NOTE — Progress Notes (Signed)
Summary: Test Res  Phone Note Call from Patient Call back at Home Phone [phone] Call back at [phone]   Caller: sister-Ann Summary of Call: Wondering what the results of test from yesterday. Initial call taken by: Raymond Gurney,  September 22, 2009 10:47 AM  Follow-up for Phone Call        spoke with son and advised that his message has been received and will forward to MD. he wants to be  notified about report but advised him that given HIPPA rules,  MD will have to give report to patient  and patient will need to sign form stating it is OK to give information about her health care to her son. he states she gets things mixed up sometimes  and he lives 4 hours away.   advised will send message to MD. Follow-up by: Marcell Barlow RN,  September 22, 2009 10:51 AM  Additional Follow-up for Phone Call Additional follow up Details #1::        Called pt. to give her results--She asked me to call back in 1 hour Additional Follow-up by: Darron Doom MD,  September 22, 2009 11:46 AM    Additional Follow-up for Phone Call Additional follow up Details #2::    pt is now asking for test results from her xray Follow-up by: Audie Clear,  September 23, 2009 9:24 AM  Additional Follow-up for Phone Call Additional follow up Details #3:: Details for Additional Follow-up Action Taken: to Dr. Kennon Rounds  On September 26, 2009 @ 09:11, I called pt and gave her results of CT, small lung nodule.  Radiology recommends follow-up in 6 mos.  Will defer that decsion to pulmonary--order for referral made, Janett Billow aware, pt. aware.  Standley Dakins. Kennon Rounds, MD   Additional Follow-up by: Elige Radon RN,  September 23, 2009 9:37 AM

## 2010-02-14 NOTE — Progress Notes (Signed)
Summary: ONO Result  Phone Note Outgoing Call   Reason for Call: Discuss lab or test results Summary of Call: call pt and tell her ONO on RA normal.   she does not need oxygen Initial call taken by: Elsie Stain MD,  October 18, 2009 11:00 AM  Follow-up for Phone Call        ATC pt's home number.  NA and unable to leave message.  WCB Crystal Jones RN  October 18, 2009 1:55 PM  ATC pt's home number x 3 - line busy.  WCB Crystal Jones RN  October 19, 2009 1:41 PM    Additional Follow-up for Phone Call Additional follow up Details #1::        Called, spoke with pt.  She was informed ONO on RA normal.  She does not need oxygen.  She verbalized understanding of this.   Additional Follow-up by: Raymondo Band RN,  October 19, 2009 2:04 PM

## 2010-02-14 NOTE — Progress Notes (Signed)
Summary: Rx Req  Phone Note Refill Request Call back at Home Phone [phone] Message from:  Patient  Refills Requested: Medication #1:  VICODIN 5-500 MG TABS 1/2 to one tablet two times a day as needed pain. DO NOT TAKE WITH TYLENOL: OK to refill on monthly schedule Initial call taken by: Raymond Gurney,  September 07, 2009 9:15 AM

## 2010-02-14 NOTE — Progress Notes (Signed)
Summary: refill  Phone Note Refill Request Call back at Home Phone [phone] Message from:  Patient  Refills Requested: Medication #1:  VICODIN 5-500 MG TABS 1/2 to one tablet two times a day as needed pain. DO NOT TAKE WITH TYLENOL Initial call taken by: Audie Clear,  February 09, 2009 9:59 AM  Follow-up for Phone Call        to pcp Follow-up by: Elige Radon RN,  February 09, 2009 9:59 AM    Prescriptions: VICODIN 5-500 MG TABS (HYDROCODONE-ACETAMINOPHEN) 1/2 to one tablet two times a day as needed pain. DO NOT TAKE WITH TYLENOL  #60 x 0   Entered and Authorized by:   Eugenie Norrie  MD   Signed by:   Eugenie Norrie  MD on 02/09/2009   Method used:   Telephoned to ...       [facility] (retail)       [address]       [place], [place]  [address]       Ph: [phone] or [phone]       Fax: [phone]   RxID:   [id number]

## 2010-02-14 NOTE — Miscellaneous (Signed)
Summary: ROI  ROI   Imported By: Beryle Lathe 09/23/2009 13:21:45  _____________________________________________________________________  External Attachment:    Type:   Image     Comment:   External Document

## 2010-02-14 NOTE — Consult Note (Signed)
Summary: [place] By: Samara Snide 12/15/2009 11:53:07  _____________________________________________________________________  External Attachment:    Type:   Image     Comment:   External Document  Appended Document: [facility] f/u & pacer interrogation    Past History:  Past Medical History: - cardiac cath  nml (07/2003), cardiac cath 2007 for complete heart block, pacer placed 2007, non-obstructive CAD at that time-->persantine myoview 06/01/2008 normal ([facility] cards)-->11/25/2009 pacemaker interrogated: normal; pt. c LV dysfx 2/2 LBBB but no HF - Overactive bladder, Trigonitis with chronic dysuria, vaginal  prolapse and cystocele -refused pesary - Schizophrenia - well controlled for "many years" on clozaril - followed by mental health - Tobacco abuse - Proteinuria, +UPEP 10/2007 - needs repeat 6-8 months - Hypercholesterolemia - COPD - PFTs--WNL 8/01 -  Repeat PFTs - Restrictive - 04/11/2004 - not on any meds for this except occasional albuterol inhaler - chronic orthostatic hypotension (contribution from clozaril and valium - both of which are managed by psych - they are aware of her hypotension) - on midodrine per cards - significant anxiety -subacute arthritis on R knee c worsening pain 11/2009 @ [facility] f/u who referred her to Ortho

## 2010-02-14 NOTE — Progress Notes (Signed)
Summary: refill  Phone Note Refill Request Call back at Home Phone [phone] Message from:  Patient  Refills Requested: Medication #1:  VICODIN 5-500 MG TABS 1/2 to one tablet two times a day as needed pain. DO NOT TAKE WITH TYLENOL Initial call taken by: Audie Clear,  March 15, 2009 4:48 PM    Prescriptions: VICODIN 5-500 MG TABS (HYDROCODONE-ACETAMINOPHEN) 1/2 to one tablet two times a day as needed pain. DO NOT TAKE WITH TYLENOL  #60 x 0   Entered and Authorized by:   Eugenie Norrie  MD   Signed by:   Eugenie Norrie  MD on 03/16/2009   Method used:   Telephoned to ...       [facility] (retail)       [address]       [place], [place]  [address]       Ph: [phone] or [phone]       Fax: [phone]   RxID:   [id number]

## 2010-02-14 NOTE — Progress Notes (Signed)
Summary: Rx Req  Phone Note Refill Request Call back at Home Phone [phone] Message from:  Patient  Refills Requested: Medication #1:  VICODIN 5-500 MG TABS 1/2 to one tablet two times a day as needed pain. DO NOT TAKE WITH TYLENOL Initial call taken by: Raymond Gurney,  May 13, 2009 12:00 PM    Prescriptions: VICODIN 5-500 MG TABS (HYDROCODONE-ACETAMINOPHEN) 1/2 to one tablet two times a day as needed pain. DO NOT TAKE WITH TYLENOL  #60 x 0   Entered and Authorized by:   Eugenie Norrie  MD   Signed by:   Eugenie Norrie  MD on 05/13/2009   Method used:   Telephoned to ...       [facility] (retail)       [address]       [place], [place]  [address]       Ph: [phone] or [phone]       Fax: [phone]   RxID:   [id number]

## 2010-02-14 NOTE — Assessment & Plan Note (Signed)
Summary: f/u [facility]/[place] pt/eo   Vital Signs:  Patient profile:   65 year old female Temp:     98.5 degrees F Pulse rate:   88 / minute BP sitting:   157 / 80  Vitals Entered By: Christen Bame CMA (December 15, 2009 9:45 AM) CC: f/u ED Pain Assessment Patient in pain? yes     Location: left side   Primary Care Provider:  Karen Kays MD  CC:  f/u ED.  History of Present Illness: Patient is a very poor historian regarding her health (much of history is obtained from reviewing records)  here for follow up from ER visit:   1) Tremor: Seen at [facility] on 11/26 for  resting tremor which started after she was put on Abilify by her psychiatrist Dr. Casimiro Needle at [facility]. She had called her psychiatrist and was told to stop taking the Abilify - her tremor was improving per her report at time of ER evaluation and patient was advised to stay off the medication. She is unsure when she actually stopped taking the Abilify, but reports that her tremor is improving. At time of ER evaluation, patient's potassium was noted to be low (remainder of I-STAT was normal) - this was repleted. Patient was noted to have an occasional resting tremor which was made worse when attention was drawn to the tremor (remainder of neurological exam was normal).  2) Increased urinary frequency: Patient with history of recurrent UTI - treated presumptively for UTI by her PCP for complaints of increased urinary frequency and dysuria (no UA obtained) with Macrobid on 11/22/09. Treated with Cipro two times a day x 7 days when seen in ER on 12/10/09 for a UA w/ moderate hemoglobin, moderate bilirubin, 15 ketones, > 300 protein, but LE negative and nitrite negative - no culture was obtained. Today patient denies dysuria but reports urinary frequency and does not feel like she is emptying her bladder completely. Review of her [facility] record reveals that patient has had multiple episodes of microscopic hematuria and proteinuria,  history of overactive bladder, trigonitis with chronic dysuria, question of vaginal wall prolapse anduterine prolapse - she has been seen by Urology (Dr. Amalia Hailey at [facility]) in the past for this and a urodynamic evaluation was to be performed in 2008 - no record of this having been done is available.    Habits & Providers  Alcohol-Tobacco-Diet     Tobacco Status: current     Tobacco Counseling: to quit use of tobacco products     Cigarette Packs/Day: 1.0     Year Started: smoked 2 packs since 9/07  Allergies (verified): 1)  ! Codeine 2)  ! Haldol 3)  ! * Cogentin  Physical Exam  General:  slightly anxious appearing, NAD  Abdomen:  no CVA or suprapubic tenderness  Neurologic:  coarse resting tremor at times - mainly occurs when daughter brings attention to the fact that she had a tremor with ability. Finger to nose normal, CN II-XII intact.    Impression & Recommendations:  Problem # 1:  MICROSCOPIC HEMATURIA (ICD-599.72) Assessment Unchanged No signs of UTI today. Chronic issue for patient on review of records. Uncertain as to prior work up for this problem. Will thoroughly review patient's chart at this time - advised patient to call her Urologist for appointment and to obtain old records as well. Consider work up for causes of chronic glomerulonephritis including SPEP/UPEP, ANCA, C3 + C4 levels, anti-GBM. Review of medications does not reveal cause.  Consider Renal referral.   Problem # 2:  RESTING TREMOR (ICD-781.0) Assessment: New  Appears to be secondary to Abilify per patient - improving per patient. Advised to follow up with PCP as well regarding this complaint.   Orders: [facility]- Est  Level 4 [id number])  Complete Medication List: 1)  Clozapine 100 Mg Tabs (Clozapine) .... One qam, one at 3pm, and 2 at 7pm (per mental health) 2)  Valium 5 Mg Tabs (Diazepam) .... Take 1 tablet by mouth at bedtime 3)  Tums Ultra 1000 1000 Mg Chew (Calcium carbonate antacid) 4)  Crestor 5  Mg Tabs (Rosuvastatin calcium) .... One by mouth daily 5)  Vicodin 5-500 Mg Tabs (Hydrocodone-acetaminophen) .... 1/2 to one tablet two times a day as needed pain. do not take with tylenol: ok to refill on monthly schedule 6)  Prilosec 20 Mg Cpdr (Omeprazole) .... One by mouth daily (per cards) 7)  Midodrine Hcl 2.5 Mg Tabs (Midodrine hcl) .... 2 daily (per cards) 8)  Spiriva Handihaler 18 Mcg Caps (Tiotropium bromide monohydrate) .... Two puffs in handihaler daily 9)  Nicotrol 10 Mg Inha (Nicotine) .... Use 60-80 puffs per cartridge, use 6-8 cartridges per day  Other Orders: Urinalysis-[facility] (00000) Urine Culture-[facility] [id number]) [id number]  Patient Instructions: 1)  You do not have an urinary tract infection.  2)  You have blood and protein in your urine  3)  Follow up with Urology. 4)  Follow up with Dr. Verdie Drown in 3 months.    Orders Added: 1)  Urinalysis-[facility] [00000] 2)  Urine Culture-[facility] [id number] 3)  Insertion of Non-indwelling cath- [facility] [[id number]] 4)  [facility]- Est  Level 4 [99214]    Laboratory Results   Urine Tests  Date/Time Received: December 15, 2009 10:22 AM  Date/Time Reported: December 15, 2009 10:53 AM   Routine Urinalysis   Color: yellow Appearance: Clear Glucose: negative   (Normal Range: Negative) Bilirubin: negative   (Normal Range: Negative) Ketone: trace (5)   (Normal Range: Negative) Spec. Gravity: 1.015   (Normal Range: 1.003-1.035) Blood: moderate   (Normal Range: Negative) pH: 6.5   (Normal Range: 5.0-8.0) Protein: >=300   (Normal Range: Negative) Urobilinogen: 0.2   (Normal Range: 0-1) Nitrite: negative   (Normal Range: Negative) Leukocyte Esterace: negative   (Normal Range: Negative)  Urine Microscopic WBC/HPF: 0-3 RBC/HPF: 5-10 Bacteria/HPF: 1+ Epithelial/HPF: 10-15    Comments: cath urine;  urine sent for culture ...............test performed by......Marland KitchenBonnie A. Martinique, MLS (ASCP)cm

## 2010-02-14 NOTE — Progress Notes (Signed)
Summary: triage  Phone Note Call from Patient Call back at Home Phone [phone]   Caller: Patient Summary of Call: had trouble breathing today - couldn't breath good and whole body shaking - has to call for ride 2 days in advance. Initial call taken by: Audie Clear,  September 12, 2009 9:39 AM  Follow-up for Phone Call        states she had an episode of trouble breathing at 4:30am today. it got better on it's own. no apparent distress during the call. states she has to get her ride 2 days notice & wants an appt for wed am.  (hx dyspnea ,COPD, tobacco use) appt made. will see someone other than her pcp as she is not in clinic wed am. told her to call 911 if this happens again. she agreed Follow-up by: Elige Radon RN,  September 12, 2009 9:42 AM

## 2010-02-14 NOTE — Progress Notes (Signed)
Summary: Rx Req for Vicodin  Phone Note Refill Request Call back at Home Phone [phone] Message from:  Patient  Refills Requested: Medication #1:  VICODIN 5-500 MG TABS 1/2 to one tablet two times a day as needed pain. DO NOT TAKE WITH TYLENOL: OK to refill on monthly schedule Initial call taken by: Raymond Gurney,  October 03, 2009 3:33 PM  Follow-up for Phone Call        to preceptor as pcp is not in clinic today Follow-up by: Elige Radon RN,  October 04, 2009 9:06 AM  Additional Follow-up for Phone Call Additional follow up Details #1::        triage looks like Dr Andria Frames gave her 3 refill sin July---so she should not be out. maybe she has refills at pharmacy and is unaware? Thanks!  Dorcas Mcmurray MD  October 04, 2009 10:19 AM     Additional Follow-up for Phone Call Additional follow up Details #2::    I asked her to call her pharmacy for a refill. told hr she should have enough to get her thru oct Follow-up by: Elige Radon RN,  October 04, 2009 10:25 AM

## 2010-02-14 NOTE — Progress Notes (Signed)
Summary: phn msg  Phone Note Call from Patient Call back at Home Phone [phone]   Caller: Patient Summary of Call: Needs letter from Dr. stating that she does need help in house.  Can Dr. Sarita Haver please call her about this. Initial call taken by: Raymond Gurney,  April 14, 2009 10:55 AM  Follow-up for Phone Call        Spoke with pt and she needs to be recertified for a home health aide.  So she needs a letter stating that she will benefit from this mailed to her.  Will forward to MD Follow-up by: Christen Bame CMA,  April 15, 2009 10:03 AM  Additional Follow-up for Phone Call Additional follow up Details #1::        letter completed Additional Follow-up by: Eugenie Norrie  MD,  April 15, 2009 11:09 AM

## 2010-02-14 NOTE — Assessment & Plan Note (Signed)
Summary: breathing issues at times/[place](resch'd from 8/31/[facility]   Vital Signs:  Patient profile:   65 year old female Height:      65 inches Weight:      216 pounds BMI:     36.07 BSA:     2.05 Temp:     98.5 degrees F Pulse rate:   70 / minute BP sitting:   125 / 71  Vitals Entered By: Christen Bame CMA (September 16, 2009 11:51 AM) CC: SOB Is Patient Diabetic? No Pain Assessment Patient in pain? no        Primary Care Sontee Desena:  Eugenie Norrie  MD  CC:  SOB.  History of Present Illness: Seen in ED on 8/31.  Here for f/u.  Multiple complaints.  Here mostly for SOB and DOE.  Continues smoking.  Given inhaler, which does not seem to be helping.  CXR also showed possible lung nodule that needs further eval.  Has cold and cough.  Denies fever, chills, hemoptysis. Reports abnl mvmts she thinks are related to clozaril. Wants to come off this medicine, will address with psychiatry.  Habits & Providers  Alcohol-Tobacco-Diet     Tobacco Status: current     Tobacco Counseling: to quit use of tobacco products     Cigarette Packs/Day: 1.0  Current Problems (verified): 1)  Weakness  (ICD-780.79) 2)  Dysuria  (ICD-788.1) 3)  Obesity, Unspecified  (ICD-278.00) 4)  Onychomycosis, Toenails  (ICD-110.1) 5)  Encounter For Long-term Use of Other Medications  (ICD-V58.69) 6)  Dyspnea  (ICD-786.05) 7)  Postmenopausal Status  (ICD-V49.81) 8)  Osteoarthritis, Hip, Right  (ICD-715.95) 9)  Obesity, Unspecified  (ICD-278.00) 10)  Orthostatic Hypotension  (ICD-458.0) 11)  Cad  (ICD-414.00) 12)  Renal Insufficiency, Chronic  (ICD-585.9) 13)  Failure, Diastolic Heart, Chronic  (ICD-428.32) 14)  Sick Sinus Syndrome  (ICD-427.81) 15)  Schizophrenia  (ICD-295.90) 16)  Incontinence, Urge  (ICD-788.31) 17)  Hypercholesterolemia  (ICD-272.0) 18)  COPD  (ICD-496) 19)  Tobacco Abuse  (ICD-305.1) 20)  Cystocele With Incomplete Uterine Prolapse  (ICD-618.2)  Current Medications  (verified): 1)  Clozapine 100 Mg Tabs (Clozapine) .... One Qam, One At 3pm, and 2 At 7pm (Per Mental Health) 2)  Valium 5 Mg Tabs (Diazepam) .... Take 1 Tablet By Mouth At Bedtime 3)  Adult Aspirin Ec Low Strength 81 Mg  Tbec (Aspirin) .... One Daily 4)  Tums Ultra 1000 Mg  Chew (Calcium Carbonate Antacid) .... One Two Times A Day 5)  Albuterol 90 Mcg/act  Aers (Albuterol) .... 2 Puffs Every 4 Hours As Needed For Wheezing and Shortness of Breath 6)  B-2 Extra High Comp Hose Women  Misc (Elastic Bandages & Supports) .... Wear Daily. Dispense One Set 7)  Crestor 5 Mg Tabs (Rosuvastatin Calcium) .... One By Mouth Daily 8)  Vicodin 5-500 Mg Tabs (Hydrocodone-Acetaminophen) .... 1/2 To One Tablet Two Times A Day As Needed Pain. Do Not Take With Tylenol: Ok To Refill On Monthly Schedule 9)  Prilosec 20 Mg Cpdr (Omeprazole) .... One By Mouth Daily (Per Cards) 10)  Midodrine Hcl 2.5 Mg Tabs (Midodrine Hcl) .... 2 Daily (Per Cards) 11)  Toprol Xl 25 Mg Xr24h-Tab (Metoprolol Succinate) .... One Daily (Per Cards)  Allergies (verified): 1)  ! Codeine 2)  ! Haldol 3)  ! * Cogentin  Past History:  Past medical, surgical, family and social histories (including risk factors) reviewed, and no changes noted (except as noted below).  Past Medical History: Reviewed history from 06/08/2008  and no changes required. CV:  - cardiac cath  nml (07/2003);  cardiac cath 2007 for complete heart block, pacer placed 2007, non-obstructive CAD at that time, persantine myoview 06/01/2008 normal [facility] cards)  - Overactive bladder, Trigonitis with chronic dysuria, vaginal  prolapse and cystocele -refused pesary - Schizophrenia - well controlled for "many years" on clozaril - followed by mental health - Tobacco abuse - Proteinuria, +UPEP 10/2007 - needs repeat 6-8 months - Hypercholesterolemia - COPD - PFTs--WNL 8/01 -  Repeat PFTs - Restrictive - 04/11/2004 - not on any meds for this except occasional albuterol  inhaler - chronic orthostatic hypotension (contribution from clozaril and valium - both of which are managed by psych - they are aware of her hypotension) - on midodrine per cards - significant anxiety  Past Surgical History: Reviewed history from 07/07/2009 and no changes required. BTL-`72  Cholecystectomy - 10/30/2001 cystoscopy--no tumor/stone - 07/03/1999 s/p int. fixation for right arm fx  Tonsillectomy - cardiolite 5/10 neg for ischemia  Family History: Reviewed history from 03/14/2006 and no changes required. Mother with `heart problems`  Social History: Reviewed history from 07/07/2009 and no changes required. ex-smoker, quit 2007, no drugs, no Etoh.  Sister very involved with medical management; calls for updates after OVs.  Pt doesn not drive; has limited transportation access.  Rides [employer] van to [place] most of time.  Sister very involved with care and visits her daily. Son Cathleen Corti) also very involved. Currently living at home. Has difficulty making it to the bathroom and wears a diaper. Per her report can clean herself and attend to other ADLs. Adquate hydration with orthostatic hypotension is an ongoing issue.  Has an aid for 1.5 hours 5 days per week.   Has 5 grandkids -- youngest just recently bornSmoking Status:  current Packs/Day:  1.0  Review of Systems       The patient complains of dyspnea on exertion.  The patient denies anorexia, weight loss, weight gain, peripheral edema, hemoptysis, abdominal pain, hematochezia, and severe indigestion/heartburn.         ? night sweats  Physical Exam  General:  alert and overweight-appearing.   Head:  normocephalic and atraumatic.   Neck:  supple.   Lungs:  normal respiratory effort and normal breath sounds.   Heart:  normal rate., distant heart sounds   Abdomen:  soft and non-tender.   Neurologic:  involuntary movements noted.   Impression & Recommendations:  Problem # 1:  DYSPNEA (ICD-786.05) continue inhaler, quit  smoking encouraged. Orders: [facility]- Est Level  3 SJ:[id number])  Problem # 2:  ? of PULMONARY NODULE (ICD-518.89)  CT recommended by radiology   Orders: [facility]- Est Level  3 [id number]) T-CT Chest w/o/wCM [id number])  Problem # 3:  UPPER RESPIRATORY INFECTION (URI) (ICD-465.9)  Trial of Mucinex Her updated medication list for this problem includes:    Adult Aspirin Ec Low Strength 81 Mg Tbec (Aspirin) ..... One daily  Orders: [place]- Est Level  3 SJ:[id number])  Complete Medication List: 1)  Clozapine 100 Mg Tabs (Clozapine) .... One qam, one at 3pm, and 2 at 7pm (per mental health) 2)  Valium 5 Mg Tabs (Diazepam) .... Take 1 tablet by mouth at bedtime 3)  Adult Aspirin Ec Low Strength 81 Mg Tbec (Aspirin) .... One daily 4)  Tums Ultra 1000 Mg Chew (Calcium carbonate antacid) .... One two times a day 5)  Albuterol 90 Mcg/act Aers (Albuterol) .... 2 puffs every 4 hours as needed for wheezing and shortness of breath  6)  B-2 Extra High Comp Hose Women Misc (Elastic bandages & supports) .... Wear daily. dispense one set 7)  Crestor 5 Mg Tabs (Rosuvastatin calcium) .... One by mouth daily 8)  Vicodin 5-500 Mg Tabs (Hydrocodone-acetaminophen) .... 1/2 to one tablet two times a day as needed pain. do not take with tylenol: ok to refill on monthly schedule 9)  Prilosec 20 Mg Cpdr (Omeprazole) .... One by mouth daily (per cards) 10)  Midodrine Hcl 2.5 Mg Tabs (Midodrine hcl) .... 2 daily (per cards) 11)  Toprol Xl 25 Mg Xr24h-tab (Metoprolol succinate) .... One daily (per cards)  Patient Instructions: 1)  Please schedule a follow-up appointment in 1 month.   Appended Document: breathing issues at times/[place](resch'd from 8/31/[facility]    Clinical Lists Changes  Orders: Added new Test order of CT with & without Contrast (CT w&w/o Contrast) - Signed

## 2010-02-14 NOTE — Progress Notes (Signed)
Summary: Rx Req  Phone Note Refill Request Call back at Home Phone [phone] Message from:  Patient  Refills Requested: Medication #1:  VICODIN 5-500 MG TABS 1/2 to one tablet two times a day as needed pain. DO NOT TAKE WITH TYLENOL Initial call taken by: Raymond Gurney,  August 05, 2009 8:33 AM  Follow-up for Phone Call        will forward to Dr. Andria Frames preceptor. Please advise and I will call in if indicated. Follow-up by: Marcell Barlow RN,  August 05, 2009 9:14 AM  Additional Follow-up for Phone Call Additional follow up Details #1::        Yes, done. Additional Follow-up by: Madison Hickman MD,  August 05, 2009 9:42 AM    New/Updated Medications: VICODIN 5-500 MG TABS (HYDROCODONE-ACETAMINOPHEN) 1/2 to one tablet two times a day as needed pain. DO NOT TAKE WITH TYLENOL: OK to refill on monthly schedule Prescriptions: VICODIN 5-500 MG TABS (HYDROCODONE-ACETAMINOPHEN) 1/2 to one tablet two times a day as needed pain. DO NOT TAKE WITH TYLENOL: OK to refill on monthly schedule  #60 x 3   Entered and Authorized by:   Madison Hickman MD   Signed by:   Madison Hickman MD on 08/05/2009   Method used:   Printed then faxed to ...       [facility] (retail)       [address]       [place],   [address]       Ph: [phone] or [phone]       Fax: [phone]   RxID:   [id number]   Appended Document: Rx Req faxed.

## 2010-02-14 NOTE — Consult Note (Signed)
Summary: [facility]   Imported By: Raymond Gurney 09/07/2009 13:44:44  _____________________________________________________________________  External Attachment:    Type:   Image     Comment:   External Document

## 2010-02-14 NOTE — Assessment & Plan Note (Signed)
Summary: Pulmonary Consultation   Copy to:  Dr. Darron Doom Primary Provider/Referring Provider:  [facility]  CC:  [place] for lung nodule and COPD initial evaluation.  History of Present Illness: Pulmonary Consultation  [phone] WF referred for LLL 69mm noncalcified lung nodule and dyspnea.  Pt smokes 1PPD.        This is a 65 year old woman who presents for COPD initial evaluation.  The patient complains of shortness of breath, chest tightness, wheezing, cough, mucous production, nocturnal awakening, and congestion, but denies history of diagnosed COPD, chest pain worse with breathing and coughing, and exercise induced symptoms.  Prior evaluation and testing has included Cxr and CT chest with contrast.  The dyspnea is described as with ADL's, with walking within room, with walking one or two blocks, with walking stairs, and with walking to the mailbox and back.  Associated disease(s) include(s) indigestion, abdominal pain, difficulty swallowing, hoarseness, sore throat, difficulty breathing through nose, hand/feet swelling, change in color of mucous, productive cough, non-productive cough, and chest tightness.  Oxygen evaluation is described as not on supplemental O2.    Dyspnea for several months progressed to needed ED visit. Pt had CXR  ? rul abn so CT chest obtained. RUL normal but showed LLL non calcified 47mm nodule.  Pt now referred for abn CT and dyspnea eval.  Preventive Screening-Counseling & Management  Alcohol-Tobacco     Smoking Status: current     Smoking Cessation Counseling: yes     Packs/Day: 1.0  Current Medications (verified): 1)  Clozapine 100 Mg Tabs (Clozapine) .... One Qam, One At 3pm, and 2 At 7pm (Per Mental Health) 2)  Valium 5 Mg Tabs (Diazepam) .... Take 1 Tablet By Mouth At Bedtime 3)  Tums Ultra 1000 Mg  Chew (Calcium Carbonate Antacid) .... As Needed 4)  Crestor 5 Mg Tabs (Rosuvastatin Calcium) .... One By Mouth Daily 5)  Vicodin 5-500 Mg  Tabs (Hydrocodone-Acetaminophen) .... 1/2 To One Tablet Two Times A Day As Needed Pain. Do Not Take With Tylenol: Ok To Refill On Monthly Schedule 6)  Prilosec 20 Mg Cpdr (Omeprazole) .... One By Mouth Daily (Per Cards) 7)  Midodrine Hcl 2.5 Mg Tabs (Midodrine Hcl) .... 2 Daily (Per Cards) 8)  Toprol Xl 25 Mg Xr24h-Tab (Metoprolol Succinate) .... One Daily (Per Cards)  Allergies (verified): 1)  ! Codeine 2)  ! Haldol 3)  ! * Cogentin  Past History:  Past medical, surgical, family and social histories (including risk factors) reviewed, and no changes noted (except as noted below).  Past Medical History: Reviewed history from 06/08/2008 and no changes required. CV:  - cardiac cath  nml (07/2003);  cardiac cath 2007 for complete heart block, pacer placed 2007, non-obstructive CAD at that time, persantine myoview 06/01/2008 normal [facility] cards)  - Overactive bladder, Trigonitis with chronic dysuria, vaginal  prolapse and cystocele -refused pesary - Schizophrenia - well controlled for "many years" on clozaril - followed by mental health - Tobacco abuse - Proteinuria, +UPEP 10/2007 - needs repeat 6-8 months - Hypercholesterolemia - COPD - PFTs--WNL 8/01 -  Repeat PFTs - Restrictive - 04/11/2004 - not on any meds for this except occasional albuterol inhaler - chronic orthostatic hypotension (contribution from clozaril and valium - both of which are managed by psych - they are aware of her hypotension) - on midodrine per cards - significant anxiety  Past Surgical History: Reviewed history from 07/07/2009 and no changes required. BTL-`72  Cholecystectomy - 10/30/2001 cystoscopy--no tumor/stone -  07/03/1999 s/p int. fixation for right arm fx  Tonsillectomy - cardiolite 5/10 neg for ischemia  Family History: Reviewed history from 03/14/2006 and no changes required. Mother with `heart problems` emphysema- father  Social History: Reviewed history from 07/07/2009 and no changes  required. Current smoker 1/2 ppd x 30 yrs, no drugs, no Etoh.  Sister very involved with medical management; calls for updates after OVs.  Pt doesn not drive; has limited transportation access.  Rides [employer] van to OVs most of time.  Sister very involved with care and visits her daily. Son Cathleen Corti) also very involved. Currently living at home. Has difficulty making it to the bathroom and wears a diaper. Per her report can clean herself and attend to other ADLs. Adquate hydration with orthostatic hypotension is an ongoing issue.  Has an aid for 1.5 hours 5 days per week.   Has 5 grandkids -- youngest just recently born 3 children  Review of Systems       The patient complains of shortness of breath with activity, shortness of breath at rest, productive cough, chest pain, irregular heartbeats, acid heartburn, indigestion, loss of appetite, abdominal pain, sore throat, tooth/dental problems, headaches, nasal congestion/difficulty breathing through nose, anxiety, hand/feet swelling, and joint stiffness or pain.  The patient denies non-productive cough, coughing up blood, weight change, difficulty swallowing, sneezing, itching, ear ache, depression, rash, change in color of mucus, and fever.        See HPI for Pulmonary, Cardiac, General, and ENT review of systems.  Vital Signs:  Patient profile:   65 year old female Height:      66 inches Weight:      217.38 pounds BMI:     35.21 O2 Sat:      96 % on Room air Temp:     97.7 degrees F oral Pulse rate:   96 / minute BP sitting:   100 / 68  (right arm) Cuff size:   regular  Vitals Entered By: Raymondo Band RN (September 30, 2009 10:16 AM)  O2 Flow:  Room air CC: Pulmnonary Consult for lung nodule, COPD initial evaluation Comments Medications reviewed with patient Daytime contact number verified with patient. Raymondo Band RN  September 30, 2009 10:17 AM   Ambulatory Pulse Oximetry  Resting; HR_93____    02 Sat_97% RA____  Lap1 (185 feet)    HR_____   02 Sat_____ Lap2 (185 feet)   HR_____   02 Sat_____    Lap3 (185 feet)   HR_____   02 Sat_____  ___Test Completed without Difficulty _x__Test Stopped due to: At approx 14ft, walk stopped d/t pt c/o dizziness and "uncomforable feeling in chest."  O2 sat 97% RA with pulse of 107. Raymondo Band RN  September 30, 2009 1:45 PM    Physical Exam  Additional Exam:  Gen: Pleasant, well-nourished, in no distress,  normal affect ENT: No lesions,  mouth clear,  oropharynx clear, no postnasal drip Neck: No JVD, no TMG, no carotid bruits Lungs: No use of accessory muscles, no dullness to percussion, poor airflow, exp wheeze  Cardiovascular: RRR, heart sounds normal, no murmur or gallops, no peripheral edema Abdomen: soft and NT, no HSM,  BS normal Musculoskeletal: No deformities, no cyanosis or clubbing Neuro: alert, non focal Skin: Warm, no lesions or rashes    CT of Chest  Procedure date:  09/21/2009  Findings:      abnormal:   emphysematous changes,  LLL 82mm noncalcified nodule  Impression & Recommendations:  Problem # 1:  ?  of PULMONARY NODULE (ICD-518.89) Assessment Unchanged 20mm nodule LLL noncalcified in a smoker plan observe for now  repeat CT in 16months smoking cessation  Problem # 2:  TOBACCO ABUSE (ICD-305.1) Assessment: Unchanged ongoing tobacco  abuse >45min spent wiht smoking cessation counselling nicotrol inhaler  Her updated medication list for this problem includes:    Nicotrol 10 Mg Inha (Nicotine) ..... Use 60-80 puffs per cartridge, use 6-8 cartridges per day  Problem # 3:  COPD (ICD-496) Assessment: Unchanged advanced copd plan ono on RA add spiriva,  pt instructed as to proper use  d/c toprol with low BP and airway disease  Medications Added to Medication List This Visit: 1)  Tums Ultra 1000 Mg Chew (Calcium carbonate antacid) .... As needed 2)  Spiriva Handihaler 18 Mcg Caps (Tiotropium bromide monohydrate) .... Two puffs in handihaler  daily 3)  Nicotrol 10 Mg Inha (Nicotine) .... Use 60-80 puffs per cartridge, use 6-8 cartridges per day  Complete Medication List: 1)  Clozapine 100 Mg Tabs (Clozapine) .... One qam, one at 3pm, and 2 at 7pm (per mental health) 2)  Valium 5 Mg Tabs (Diazepam) .... Take 1 tablet by mouth at bedtime 3)  Tums Ultra 1000 Mg Chew (Calcium carbonate antacid) .... As needed 4)  Crestor 5 Mg Tabs (Rosuvastatin calcium) .... One by mouth daily 5)  Vicodin 5-500 Mg Tabs (Hydrocodone-acetaminophen) .... 1/2 to one tablet two times a day as needed pain. do not take with tylenol: ok to refill on monthly schedule 6)  Prilosec 20 Mg Cpdr (Omeprazole) .... One by mouth daily (per cards) 7)  Midodrine Hcl 2.5 Mg Tabs (Midodrine hcl) .... 2 daily (per cards) 8)  Spiriva Handihaler 18 Mcg Caps (Tiotropium bromide monohydrate) .... Two puffs in handihaler daily 9)  Nicotrol 10 Mg Inha (Nicotine) .... Use 60-80 puffs per cartridge, use 6-8 cartridges per day  Other Orders: Pulse Oximetry, Ambulatory [id number] New Patient Level V [id number]) DME Referral (DME)  Patient Instructions: 1)  Stop Toprol 2)  Start Spiriva daily 3)  Start nicotrol inhaler 60-80 puffs per cartridge, 6-8 cartridges per day 4)  An overnight sleep oximetry will be obtained, HPMS will call to set up 5)  We will need to repeat the CT of the chest in 6 months  6)  Return 1 month Prescriptions: NICOTROL 10 MG INHA (NICOTINE) Use 60-80 puffs per cartridge, use 6-8 cartridges per day  #1 month x 2   Entered and Authorized by:   Elsie Stain MD   Signed by:   Elsie Stain MD on 09/30/2009   Method used:   Electronically to        [facility] [address] [facility]* (retail)       [address]       [place], [place]  [address]       Ph: [phone] or [phone]       Fax: [phone]   RxID:   [id number] SPIRIVA HANDIHALER 18 MCG  CAPS (TIOTROPIUM BROMIDE MONOHYDRATE) Two puffs in handihaler daily  #30 x 6    Entered and Authorized by:   Elsie Stain MD   Signed by:   Elsie Stain MD on 09/30/2009   Method used:   Electronically to        [facility] [address] [phone]* (retail)       [address]       [place]  [place], [place]  [address]       Ph: [phone] or [phone]       Fax: [phone]   RxID:   [id number]

## 2010-02-14 NOTE — Progress Notes (Signed)
Summary: RX REQ  Phone Note Refill Request Call back at Home Phone [phone] Message from:  Patient  Refills Requested: Medication #1:  VICODIN 5-500 MG TABS 1/2 to one tablet two times a day as needed pain. DO NOT TAKE WITH TYLENOL PT USES [place].  Initial call taken by: Raymond Gurney,  April 12, 2009 3:13 PM    Prescriptions: VICODIN 5-500 MG TABS (HYDROCODONE-ACETAMINOPHEN) 1/2 to one tablet two times a day as needed pain. DO NOT TAKE WITH TYLENOL  #60 x 0   Entered and Authorized by:   Eugenie Norrie  MD   Signed by:   Eugenie Norrie  MD on 04/13/2009   Method used:   Telephoned to ...       [facility] [address] [facility]* (retail)       [address]       [place], [place]  [address]       Ph: [phone] or [phone]       Fax: [phone]   RxID:   [id number]

## 2010-02-14 NOTE — Miscellaneous (Signed)
Summary: ONO RA  Clinical Lists Changes  Observations: Added new observation of SLEEP STUDY: Low Oxygen Sat: 79% testing error Oximetry overnight on          RA         =  < 88%  1.3 min  not enough time to declare oxygen therapy indicated  (10/05/2009 10:59)      Sleep Study  Procedure date:  10/05/2009  Findings:      Low Oxygen Sat: 79% testing error Oximetry overnight on          RA         =  < 88%  1.3 min  not enough time to declare oxygen therapy indicated

## 2010-02-14 NOTE — Progress Notes (Signed)
Summary: refill  Phone Note Refill Request Call back at Home Phone [phone] Message from:  Patient  Refills Requested: Medication #1:  VICODIN 5-500 MG TABS 1/2 to one tablet two times a day as needed pain. DO NOT TAKE WITH TYLENOL Initial call taken by: Audie Clear,  Jun 14, 2009 8:59 AM  Follow-up for Phone Call        to pcp Follow-up by: Elige Radon RN,  Jun 14, 2009 9:30 AM    Prescriptions: VICODIN 5-500 MG TABS (HYDROCODONE-ACETAMINOPHEN) 1/2 to one tablet two times a day as needed pain. DO NOT TAKE WITH TYLENOL  #60 x 0   Entered and Authorized by:   Eugenie Norrie  MD   Signed by:   Eugenie Norrie  MD on 06/15/2009   Method used:   Telephoned to ...       [facility] (retail)       [address]       [place], [place]  [address]       Ph: [phone] or [phone]       Fax: [phone]   RxID:   [id number]

## 2010-02-14 NOTE — Progress Notes (Signed)
Summary: Refill Vicodin request  Phone Note Refill Request Call back at Home Phone [phone] Message from:  Patient  Refills Requested: Medication #1:  VICODIN 5-500 MG TABS 1/2 to one tablet two times a day as needed pain. DO NOT TAKE WITH TYLENOL: OK to refill on monthly schedule Next Appointment Scheduled: 11/8 Initial call taken by: Audie Clear,  October 27, 2009 10:38 AM  Follow-up for Phone Call        to pcp who will be here this pm Follow-up by: Elige Radon RN,  October 27, 2009 10:40 AM

## 2010-02-16 NOTE — Progress Notes (Signed)
Summary: Rx Vicodin (for 2 months)  Phone Note Refill Request Call back at Home Phone [phone]   Refills Requested: Medication #1:  VICODIN 5-500 MG TABS 1/2 to one tablet two times a day as needed pain. DO NOT TAKE WITH TYLENOL: OK to refill on monthly schedule pt sts she is suppose to get 60 tablets, not 30  Initial call taken by: Samara Snide,  January 17, 2010 8:53 AM    Prescriptions: VICODIN 5-500 MG TABS (HYDROCODONE-ACETAMINOPHEN) 1/2 to one tablet two times a day as needed pain. DO NOT TAKE WITH TYLENOL: OK to refill on monthly schedule  #60 x 1   Entered and Authorized by:   Karen Kays MD   Signed by:   Sheral Flow Park MD on 01/21/2010   Method used:   Print then Give to Patient   RxID:   [id number]  Will you please inform patient that Rx will be ready for pick-up after 1:30pm on Monday (01/09)?

## 2010-02-16 NOTE — Letter (Signed)
Summary: Generic Letter  [facility]  [address]   [place], [place] [address]   Phone: [phone]  Fax: [phone]    02/09/2010  Abigail Wallace, [place]  [address]  Dear [name],  We are happy to let you know that since you are covered under Medicare you are able to have a FREE visit at the [facility] to discuss your HEALTH. This is a new benefit for Medicare.  There will be no co-payment.  At this visit you will meet with Lamont Dowdy an expert in wellness and the health coach at our clinic.  At this visit we will discuss ways to keep you healthy and feeling well.  This visit will not replace your regular doctor visit and we cannot refill medications.     You will need to plan to be here at least one hour to talk about your medical history, your current status, review all of your medications, and discuss your future plans for your health.  This information will be entered into your record for your doctor to have and review.  If you are interested in staying healthy, this type of visit can help.  Please call the office at: [phone], to schedule a "Medicare Wellness Visit".  The day of the visit you should bring in all of your medications, including any vitamins, herbs, over the counter products you take.  Make a list of all the other doctors that you see, so we know who they are. If you have any other health documents please bring them.  We look forward to helping you stay healthy.  Sincerely,   Suzanne Lineberry [place]

## 2010-02-16 NOTE — Consult Note (Signed)
[facility]: [facility]   Imported By: Audie Clear 02/07/2010 12:15:33  _____________________________________________________________________  External Attachment:    Type:   Image     Comment:   External Document

## 2010-02-16 NOTE — Consult Note (Signed)
Summary: [facility]   Imported By: Audie Clear 01/05/2010 16:15:05  _____________________________________________________________________  External Attachment:    Type:   Image     Comment:   External Document

## 2010-02-16 NOTE — Consult Note (Signed)
Summary: [facility]   Imported By: Audie Clear 01/05/2010 16:14:49  _____________________________________________________________________  External Attachment:    Type:   Image     Comment:   External Document  Appended Document: [facility]    Past History:  Past Medical History: -Tobacco abuse  -Hypercholesterolemia  -chronic orthostatic hypotension (contribution from clozaril and valium - both of which are managed by psych - they are aware of her hypotension) - on midodrine per cards  -CARDIAC cath  nml (07/2003), cardiac cath 2007 for complete heart block, pacer placed 2007, non-obstructive CAD at that time-->persantine myoview 06/01/2008 normal ([place] cards)-->11/25/2009 pacemaker interrogated: normal; pt. c LV dysfx 2/2 LBBB but no HF  -GU  -Overactive bladder/urge incontinence--wears diapers  -Trigonitis with chronic dysuria  -vaginal  prolapse and cystocele--refused pesary  -Proteinuria, +UPEP 10/200-->persistent proteinuria, >300 12/2009  -hematuria: (12/30/09) seen by Urology, CT urogram shows 2-3cm exophytic structure (?cyst) R kidney, U/S pending  GI: umbilical/ventral hernia-->referred to Surgery 12/2009  -PSYCH   -Schizophrenia - well controlled for "many years" on clozaril - followed by mental health  -significant anxiety  -PULM  -COPD - PFTs--WNL 8/01 -  Repeat PFTs - Restrictive - 04/11/2004 - not on any meds for this except occasional albuterol inhaler  -55mm nodule R lung base found on CT (12/30/2009)  -subacute arthritis on R knee c worsening pain 11/2009 @ [facility] f/u who referred her to Ortho

## 2010-02-17 ENCOUNTER — Encounter (INDEPENDENT_AMBULATORY_CARE_PROVIDER_SITE_OTHER): Payer: Medicare Other | Admitting: Family Medicine

## 2010-02-17 ENCOUNTER — Telehealth: Payer: Self-pay | Admitting: Family Medicine

## 2010-02-17 ENCOUNTER — Telehealth: Payer: Self-pay | Admitting: *Deleted

## 2010-02-17 ENCOUNTER — Encounter: Payer: Self-pay | Admitting: Family Medicine

## 2010-02-17 DIAGNOSIS — I890 Lymphedema, not elsewhere classified: Secondary | ICD-10-CM | POA: Insufficient documentation

## 2010-02-17 DIAGNOSIS — R609 Edema, unspecified: Secondary | ICD-10-CM

## 2010-02-17 DIAGNOSIS — R3129 Other microscopic hematuria: Secondary | ICD-10-CM

## 2010-02-17 DIAGNOSIS — F209 Schizophrenia, unspecified: Secondary | ICD-10-CM

## 2010-02-17 LAB — CONVERTED CEMR LAB
ALT: 14 units/L (ref 0–35)
AST: 24 units/L (ref 0–37)
Basophils Absolute: 0.1 10*3/uL (ref 0.0–0.1)
Bilirubin Urine: NEGATIVE
Calcium: 9 mg/dL (ref 8.4–10.5)
Chloride: 106 meq/L (ref 96–112)
Creatinine, Ser: 0.87 mg/dL (ref 0.40–1.20)
Eosinophils Absolute: 0.4 10*3/uL (ref 0.0–0.7)
Eosinophils Relative: 3 % (ref 0–5)
HCT: 40.1 % (ref 36.0–46.0)
Lymphocytes Relative: 27 % (ref 12–46)
MCV: 80.5 fL (ref 78.0–100.0)
Nitrite: NEGATIVE
Platelets: 375 10*3/uL (ref 150–400)
Protein, U semiquant: >=300
RDW: 16.4 % — ABNORMAL HIGH (ref 11.5–15.5)
Sed Rate: 7 mm/hr (ref 0–22)
Sodium: 140 meq/L (ref 135–145)
Urobilinogen, UA: 0.2
WBC Urine, dipstick: NEGATIVE

## 2010-02-20 ENCOUNTER — Ambulatory Visit (INDEPENDENT_AMBULATORY_CARE_PROVIDER_SITE_OTHER): Payer: Medicare Other | Admitting: Family Medicine

## 2010-02-20 ENCOUNTER — Encounter: Payer: Self-pay | Admitting: Family Medicine

## 2010-02-20 DIAGNOSIS — R609 Edema, unspecified: Secondary | ICD-10-CM

## 2010-02-20 DIAGNOSIS — R259 Unspecified abnormal involuntary movements: Secondary | ICD-10-CM

## 2010-02-20 DIAGNOSIS — F209 Schizophrenia, unspecified: Secondary | ICD-10-CM

## 2010-02-20 DIAGNOSIS — R3129 Other microscopic hematuria: Secondary | ICD-10-CM

## 2010-02-21 ENCOUNTER — Telehealth: Payer: Self-pay | Admitting: Family Medicine

## 2010-02-22 NOTE — Progress Notes (Signed)
Summary: Phone medication management order request  Phone Note From Other Clinic Call back at [phone] or [phone]   Caller: director of [facility] Summary of Call: has had a change in her condition and mental health status wants to know if they can get an order for medication management for her. Initial call taken by: Audie Clear,  February 17, 2010 10:17 AM    New/Updated Medications: * MEDICATION MANAGEMENT  Prescriptions: MEDICATION MANAGEMENT   #N/A x 0   Entered and Authorized by:   Karen Kays MD   Signed by:   Sheral Flow Park MD on 02/17/2010   Method used:   Printed then faxed to ...         [id number]  Will fax over Rx to [phone], [facility], asking them to management patient's medications for her.

## 2010-02-22 NOTE — Progress Notes (Signed)
  Phone Note Outgoing Call   Call placed by: Enid Skeens, [place],  February 17, 2010 12:21 PM Call placed to: pharmacy Summary of Call: Called in rx to pharmacy

## 2010-02-22 NOTE — Assessment & Plan Note (Signed)
Summary: swollen foot,df   Vital Signs:  Patient profile:   65 year old female Height:      66 inches Weight:      186 pounds Temp:     98.1 degrees F oral Pulse rate:   60 / minute Pulse rhythm:   regular BP sitting:   158 / 83  (left arm) Cuff size:   regular CC: swollen  Is Patient Diabetic? No   Primary Care Provider:  Karen Kays MD  CC:  swollen .  History of Present Illness: LE swelling and rash for 2 weeks, seen in ER January 30 for this.  Treated with Keflex (for UTI-saw Wrenn last week, pt with chronic hematuria), and hydroxyzine for itching and rash.  Patient says that she is not better.  She is very complicated and is constantly shooting out complaints and history or multiple subspecialists seen in the past few months, the summary is:  Psych:  had been on clozaril for years, discontinued in November (not sure why) and changed to Abilify.  Abilify caused her to shake all over, it was discontinued by her psychiatrist [facility]) and she was not one any meds for her schizophrenia until this week, on 1/31 she was started on Latuda.  During this time her weight has dropped from 216 to 186 pounds.  Urology:  full evaluation by Dr. Jeffie Pollock for hematuria, negative culture one week ago.  Pyuria in ER.  Jeffie Pollock is calling it chronic cystitis  GYN:  Uterine/cervical prolapse evaluated by Jeanann Lewandowsky and he did not recommend surgery  ER visit on 02/13/10:  treated with Keflex and hydroxyzine.  Today she was treated again. She also wants her hydrocodone refilled for #60, ordered two times a day but received #30.  She shaved off a circle on the top of her scalp at the hair line as she felt she had tics in her hair.  She is on midodrine for past hypotension but BPs are now elevated today and in the ER.  Habits & Providers  Alcohol-Tobacco-Diet     Tobacco Status: current     Tobacco Counseling: to quit use of tobacco products     Cigarette Packs/Day: 1.0     Year Started:  smoked 2 packs since 9/07  Exercise-Depression-Behavior     Have you felt down or hopeless? no     Have you felt little pleasure in things? no     Depression Counseling: not indicated; screening negative for depression     Seat Belt Use: always  Current Medications (verified): 1)  Valium 5 Mg Tabs (Diazepam) .... Take 1 Tablet By Mouth At Bedtime 2)  Tums Ultra 1000 1000 Mg Chew (Calcium Carbonate Antacid) 3)  Crestor 5 Mg Tabs (Rosuvastatin Calcium) .... One By Mouth Daily 4)  Vicodin 5-500 Mg Tabs (Hydrocodone-Acetaminophen) .... 1/2 To One Tablet Two Times A Day As Needed Pain. Do Not Take With Tylenol: Ok To Refill On Monthly Schedule 5)  Prilosec 20 Mg Cpdr (Omeprazole) .... One By Mouth Daily (Per Cards) 6)  Midodrine Hcl 2.5 Mg Tabs (Midodrine Hcl) .... 2 Daily (Per Cards) 7)  Spiriva Handihaler 18 Mcg  Caps (Tiotropium Bromide Monohydrate) .... Two Puffs in Handihaler Daily 8)  Latuda 40 Mg Tabs (Lurasidone Hcl) .... One Daily Per Plovsky  Allergies (verified): 1)  ! Codeine 2)  ! Haldol 3)  ! * Cogentin  Social History: Therapist, art Use:  always  Review of Systems      See HPI  Physical Exam  General:  Chronically physically and mentally ill, taking non-stop and jumping from one subject to another, all related to her medical problems Mouth:  moist, no ulcerations Lungs:  normal respiratory effort, normal breath sounds, no crackles, and no wheezes.   Heart:  normal rate, regular rhythm, and no murmur.   Extremities:  4+ edema, with errythema Skin:  red macular rash confluent on her feet and hands. Psych:  manic type behavior   Impression & Recommendations:  Problem # 1:  LEG EDEMA, BILATERAL (ICD-782.3) Suspect venous insufficiency with associated dermatitis (reviewed case with Dr. Erin Hearing).  Add compression stocking and reviewed importance of elevating legs.  Check labs.  Return to clinic in 3 days. Orders: Comp Met-[facility] [id number]) TSH-[facility] [id number]) Sed  Rate (ESR)-[facility] [id number]) [place]- Est  Level 4 [id number])  Problem # 2:  SCHIZOPHRENIA (ICD-295.90)  on three meds in past 3 months, significant weight loss, new antipsychotic added after rash and swelling no longer appears to need midodrine as BP was 150/83 and in ER much higher-discontinue midodrine  Orders: [place]- Est  Level 4 ([id number])  Problem # 3:  MICROSCOPIC HEMATURIA (ICD-599.72) Urine today reading moderate blood on test strip but micro with 0-4.  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  Urine was concentrated, and showed yeast.  Will be back on Monday will treated systemically for yeast then, she just came off a course of Keflex. Orders: Urinalysis-[facility] ([id number]) CBC w/Diff-[facility] [id number]) [facility]- Est  Level 4 [id number])  Complete Medication List: 1)  Valium 5 Mg Tabs (Diazepam) .... Take 1 tablet by mouth at bedtime 2)  Tums Ultra 1000 1000 Mg Chew (Calcium carbonate antacid) 3)  Crestor 5 Mg Tabs (Rosuvastatin calcium) .... One by mouth daily 4)  Vicodin 5-500 Mg Tabs (Hydrocodone-acetaminophen) .... 1/2 to one tablet two times a day as needed pain. do not take with tylenol: ok to refill on monthly schedule 5)  Prilosec 20 Mg Cpdr (Omeprazole) .... One by mouth daily (per cards) 6)  Spiriva Handihaler 18 Mcg Caps (Tiotropium bromide monohydrate) .... Two puffs in handihaler daily 7)  Latuda 40 Mg Tabs (Lurasidone hcl) .... One daily per plovsky 8)  Medication Management   Patient Instructions: 1)  Return in next week to see Bahja Bence on Monday 2)  Stop the midodrine 3)  Elastic stocking put on tomorrow after laying down all night, this will reduce the swelling; you need to keep your legs elevated and drink water. 4)  Today we are not adding any new meds, we are taking one away.   Orders Added: 1)  Urinalysis-[facility] [00000] 2)  CBC w/Diff-[facility] [[id number]] 3)  Comp Met-[facility] [[id number]] 4)  TSH-[facility] [id number] 5)  Sed Rate (ESR)-[facility] [[id number]] 6)  [facility]- Est  Level 4  [id number]    Laboratory Results   Urine Tests  Date/Time Received: February 17, 2010 11:42 AM  Date/Time Reported: February 17, 2010 12:27 PM   Routine Urinalysis   Color: yellow Appearance: Clear Glucose: negative   (Normal Range: Negative) Bilirubin: negative   (Normal Range: Negative) Ketone: negative   (Normal Range: Negative) Spec. Gravity: 1.025   (Normal Range: 1.003-1.035) Blood: moderate   (Normal Range: Negative) pH: 6.0   (Normal Range: 5.0-8.0) Protein: >=300   (Normal Range: Negative) Urobilinogen: 0.2   (Normal Range: 0-1) Nitrite: negative   (Normal Range: Negative) Leukocyte Esterace: negative   (Normal Range: Negative)  Urine Microscopic WBC/HPF: occ RBC/HPF: 0-4 Bacteria/HPF: trace Epithelial/HPF:  10-20 Yeast/HPF: mod    Comments: ...............test performed by......Marland KitchenBonnie A. Martinique, MLS (ASCP)cm

## 2010-03-02 NOTE — Progress Notes (Signed)
Summary: Patient insisting that she needs an antibotic  Phone Note Call from Patient Call back at Home Phone [phone]   Reason for Call: Talk to Nurse Summary of Call: pt checking status abx she thought was suppose to be called in at last visit Initial call taken by: Samara Snide,  February 21, 2010 1:46 PM  Follow-up for Phone Call        patient thought she was suppose to have an rx sent for cephalaxin. pharmacy is [facility] on [address]. will forward to Tereasa Coop. Follow-up by: Marcell Barlow RN,  February 21, 2010 2:52 PM  Additional Follow-up for Phone Call Additional follow up Details #1::        She is not to get an antibotic, she does not have an infection.  She just needs to get compression stockings and wear them when she is up. Additional Follow-up by: Tereasa Coop NP,  February 21, 2010 2:57 PM    Additional Follow-up for Phone Call Additional follow up Details #2::     patient is admant that she need antibiotic . states she doesn't feels well. states her back and stomach hurts but this was also going on yesterday. tried to explain to patient that she didn't have infection . needs to use the cream that was sent in but she feels she needs antibiotic and again ask me to ask.  I called back and talked to her and I am not prescribing an antiboitic, there is no indication.  She was treated with one week of Keflex on January 30 for 7 days for a ? cellulitis.  Her WBC count came down from 12 to 10 over that week.  If she wants to be seen again it is her choice.  She accepted this. I am not sure what she will do. Follow-up by: Tereasa Coop NP,  February 21, 2010 3:27 PM

## 2010-03-02 NOTE — Assessment & Plan Note (Signed)
Summary: LE swelling, 3 day follow up apt   Vital Signs:  Patient profile:   65 year old female Height:      66 inches Weight:      186 pounds BMI:     30.13 Temp:     98.7 degrees F oral Pulse rate:   68 / minute BP sitting:   165 / 67  (right arm)  Vitals Entered By: Mauricia Area CMA, (February 20, 2010 11:48 AM) CC: unable to sleep. f/up cellulitis. f/up change of medication Is Patient Diabetic? No Pain Assessment Patient in pain? yes     Location: back Intensity: 8 Onset of pain  x 4-5 weeks.   Primary Care Provider:  Karen Kays MD  CC:  unable to sleep. f/up cellulitis. f/up change of medication.  History of Present Illness: Abigail Wallace is here for her 3 day follow up.  Her feet are still swollen, they did not pick up the compression stockings as they are 60$.  I discussed the importance of this and a fluid pill would not necessarily change the edema and may drop her blood pressure.  She is worried about less today after I told her the blood work was all normal.  Her neice thought that the urologist felt she should get her ventral hernal repaired, I asked they discuss with Dr. Verdie Drown and sort out the risk benefit.  They asked me to send a report to Dr. Casimiro Needle that she is shaking more on the new antipsychotic and apparently has not slept in weeks.    Urine with yeast last visit, oral antigungal with drug drug interaction with antipsychotic, will use toipcal.  Habits & Providers  Alcohol-Tobacco-Diet     Tobacco Status: current     Tobacco Counseling: to quit use of tobacco products     Cigarette Packs/Day: 0.25  Current Medications (verified): 1)  Valium 5 Mg Tabs (Diazepam) .... Take 1 Tablet By Mouth At Bedtime 2)  Tums Ultra 1000 1000 Mg Chew (Calcium Carbonate Antacid) 3)  Crestor 5 Mg Tabs (Rosuvastatin Calcium) .... One By Mouth Daily 4)  Vicodin 5-500 Mg Tabs (Hydrocodone-Acetaminophen) .... 1/2 To One Tablet Two Times A Day As Needed Pain. Do Not  Take With Tylenol: Ok To Refill On Monthly Schedule 5)  Prilosec 20 Mg Cpdr (Omeprazole) .... One By Mouth Daily (Per Cards) 6)  Spiriva Handihaler 18 Mcg  Caps (Tiotropium Bromide Monohydrate) .... Two Puffs in Handihaler Daily 7)  Latuda 40 Mg Tabs (Lurasidone Hcl) .... One Daily Per Plovsky 8)  Medication Management 9)  Terazol 3 0.8 % Crea (Terconazole) .... Use One Applicator Full At Bedtime For 3 Nights, Qs  Allergies (verified): 1)  ! Codeine 2)  ! Haldol 3)  ! * Cogentin  Social History: Packs/Day:  0.25  Review of Systems      See HPI GI:  Complains of diarrhea. GU:  Complains of hematuria. MS:  Complains of low back pain. Derm:  Complains of lesion(s); sores.  Physical Exam  General:  Chronically mentally and physically ill, in good spiritis today, less anxious. Lungs:  normal respiratory effort and normal breath sounds.   Heart:  normal rate and regular rhythm.   Extremities:  3+ LE edema to knees with overlying stasis dermatitis Neurologic:  generalized tremor with action Skin:  few small picking lesions on upper back, lower back, arms and scalp Psych:  anxious, appropariate, less pressured today   Impression & Recommendations:  Problem #  1:  LEG EDEMA, BILATERAL (ICD-782.3)  Secondary to venous insuff, and assocaited venous stasis dermatitis.  Recommend compression stockings, family will purchase and Eurcerin cream to control rashing.  Normal labs including TSH, Sed rate, CBC, CMET.  Recheck in one month.  Orders: [place]- Est  Level 4 [id number])  Problem # 2:  RESTING TREMOR (ICD-781.0)  Seems to be associated with antipsychotics, family feels that the [place] is also causing the same symptoms.    Orders: [place]- Est  Level 4 [id number])  Problem # 3:  MICROSCOPIC HEMATURIA (ICD-599.72)  chronic, no growth on urine testing, yeast in urine last test will treat topcially as systemic treatment has drug drug interaction with Latuda  Orders: [place]- Est  Level 4  ([id number])  Problem # 4:  SCHIZOPHRENIA (ICD-295.90)  good supportive family  Orders: [place]- Est  Level 4 [id number])  Complete Medication List: 1)  Valium 5 Mg Tabs (Diazepam) .... Take 1 tablet by mouth at bedtime 2)  Tums Ultra 1000 1000 Mg Chew (Calcium carbonate antacid) 3)  Crestor 5 Mg Tabs (Rosuvastatin calcium) .... One by mouth daily 4)  Vicodin 5-500 Mg Tabs (Hydrocodone-acetaminophen) .... 1/2 to one tablet two times a day as needed pain. do not take with tylenol: ok to refill on monthly schedule 5)  Prilosec 20 Mg Cpdr (Omeprazole) .... One by mouth daily (per cards) 6)  Spiriva Handihaler 18 Mcg Caps (Tiotropium bromide monohydrate) .... Two puffs in handihaler daily 7)  Latuda 40 Mg Tabs (Lurasidone hcl) .... One daily per plovsky 8)  Medication Management  9)  Terazol 3 0.8 % Crea (Terconazole) .... Use one applicator full at bedtime for 3 nights, qs  Patient Instructions: 1)  Apt with Dr. Verdie Drown in March 2)  Please pick up your cream for the yeast infection 3)  Please pick up your LE compression stockings; use Eucerin cream on the red skin on your legs 4)  You are doing very well 5)  Call Dr. Casimiro Needle to let him know you are shacking on the new medication. Prescriptions: TERAZOL 3 0.8 % CREA (TERCONAZOLE) use one applicator full at bedtime for 3 nights, QS  #1 x 0   Entered and Authorized by:   Tereasa Coop NP   Signed by:   Tereasa Coop NP on 02/20/2010   Method used:   Electronically to        [id number] (retail)       [address]       [place], [place]  [address]       Ph: [phone]       Fax: [phone]   RxID:   [id number]    Orders Added: 1)  [place]- Est  Level 4 [id number]

## 2010-03-10 ENCOUNTER — Encounter: Payer: Self-pay | Admitting: Family Medicine

## 2010-03-10 ENCOUNTER — Telehealth: Payer: Self-pay | Admitting: Family Medicine

## 2010-03-10 NOTE — Telephone Encounter (Signed)
Pt asking for refill on pain med & wants to speak with MD

## 2010-03-14 NOTE — Telephone Encounter (Signed)
1. Vicodin Has been on long-term (4-33yrs) on consistent dose (2 tablets daily).  Called pharmacy and refilled. Medicine will be delivered and patient will receive on 03/01, which is when she will be due  Will have patient sign pain contract at next visit in March 2012.  2. Tooth infection Per dentist. Given Rx for antibiotics but patient wants another one because patient told that all her diseased teeth could not be removed and was told that she still has infection. Asked patient to call dentist and ask his office if she needs another Rx.  Currently with tooth pain but no fevers.  3. Gastric pain Asked patient to make appointment to have this addressed.  Patient on omeprazole. Unsure whether she was taking anything for reflux but pharmacist says Dr. Rollene Fare has prescribed. Will encourage her to take.

## 2010-03-21 ENCOUNTER — Telehealth: Payer: Self-pay | Admitting: Family Medicine

## 2010-03-21 NOTE — Telephone Encounter (Signed)
Refused refill request for nitrofurantoin Needs appt if she is having sx Please let her know THANKS! Judaea Burgoon

## 2010-03-21 NOTE — Telephone Encounter (Signed)
Patient is scheduled to see Dr. Verdie Drown 03/27/2010 @ 8:30am  Lauralyn Primes

## 2010-03-23 ENCOUNTER — Encounter: Payer: Self-pay | Admitting: Family Medicine

## 2010-03-23 NOTE — Progress Notes (Signed)
  Subjective:    Patient ID: Abigail Wallace, female    DOB: 07-Feb-1945, 65 y.o.   MRN: [id number]  HPI    Review of Systems     Objective:   Physical Exam        Assessment & Plan:  Received frequent fax requests for home services. Most recently from [place] fo eligibility for [facility] services. I have not met this patient over pas several months. She has an appointment with me on 03/27/2010. I will consider filling out her papeerwork a this appointment.

## 2010-03-24 ENCOUNTER — Telehealth: Payer: Self-pay | Admitting: Home Health Services

## 2010-03-27 ENCOUNTER — Ambulatory Visit (INDEPENDENT_AMBULATORY_CARE_PROVIDER_SITE_OTHER): Payer: Medicare Other | Admitting: Family Medicine

## 2010-03-27 ENCOUNTER — Encounter: Payer: Self-pay | Admitting: Family Medicine

## 2010-03-27 DIAGNOSIS — N8110 Cystocele, unspecified: Secondary | ICD-10-CM | POA: Insufficient documentation

## 2010-03-27 DIAGNOSIS — E7800 Pure hypercholesterolemia, unspecified: Secondary | ICD-10-CM

## 2010-03-27 DIAGNOSIS — I2510 Atherosclerotic heart disease of native coronary artery without angina pectoris: Secondary | ICD-10-CM

## 2010-03-27 DIAGNOSIS — F209 Schizophrenia, unspecified: Secondary | ICD-10-CM

## 2010-03-27 DIAGNOSIS — M1710 Unilateral primary osteoarthritis, unspecified knee: Secondary | ICD-10-CM

## 2010-03-27 DIAGNOSIS — M169 Osteoarthritis of hip, unspecified: Secondary | ICD-10-CM

## 2010-03-27 DIAGNOSIS — M1610 Unilateral primary osteoarthritis, unspecified hip: Secondary | ICD-10-CM

## 2010-03-27 DIAGNOSIS — J449 Chronic obstructive pulmonary disease, unspecified: Secondary | ICD-10-CM

## 2010-03-27 DIAGNOSIS — M1711 Unilateral primary osteoarthritis, right knee: Secondary | ICD-10-CM | POA: Insufficient documentation

## 2010-03-27 DIAGNOSIS — N3281 Overactive bladder: Secondary | ICD-10-CM | POA: Insufficient documentation

## 2010-03-27 DIAGNOSIS — G4733 Obstructive sleep apnea (adult) (pediatric): Secondary | ICD-10-CM | POA: Insufficient documentation

## 2010-03-27 DIAGNOSIS — R109 Unspecified abdominal pain: Secondary | ICD-10-CM | POA: Insufficient documentation

## 2010-03-27 DIAGNOSIS — G4730 Sleep apnea, unspecified: Secondary | ICD-10-CM | POA: Insufficient documentation

## 2010-03-27 DIAGNOSIS — J4489 Other specified chronic obstructive pulmonary disease: Secondary | ICD-10-CM

## 2010-03-27 DIAGNOSIS — R3129 Other microscopic hematuria: Secondary | ICD-10-CM

## 2010-03-27 DIAGNOSIS — R609 Edema, unspecified: Secondary | ICD-10-CM

## 2010-03-27 DIAGNOSIS — IMO0002 Reserved for concepts with insufficient information to code with codable children: Secondary | ICD-10-CM

## 2010-03-27 MED ORDER — ALBUTEROL SULFATE HFA 108 (90 BASE) MCG/ACT IN AERS: 2.0000 | INHALATION_SPRAY | Freq: Four times a day (QID) | RESPIRATORY_TRACT | Status: DC | PRN

## 2010-03-27 MED ORDER — HYDROCODONE-ACETAMINOPHEN 5-500 MG PO TABS: 1.0000 | ORAL_TABLET | Freq: Two times a day (BID) | ORAL | Status: DC

## 2010-03-27 NOTE — Assessment & Plan Note (Signed)
Patient's blood pressure elevated today. Previously it seems like Clozaril and Valium were keeping BP low. Also, midodrine has been discontinued, although unclear why. Will request office notes from Dr. Lowella Fairy office. Due to history of NSTEMI and CAD (but no evidence of heart failure), consider starting patient on beta-blocker? Will go over notes and either discuss with Dr. Rollene Fare or just wait for patient to see him (next visit with him in about 2 weeks).

## 2010-03-27 NOTE — Progress Notes (Signed)
Subjective:    Patient ID: Abigail Wallace, female    DOB: [birth year], 65 y.o.   MRN: [id number]  HPI History from patient and sister. Patient is not a good historian but is appropriate to most questions.   1. Abdominal pain for the past 4 months With dysuria and hematuria. Thinks may have a urine infection. Used 12 diapers last night.  Wants a GU exam.  Urologist who worked patient up for hematuria/dysuria in January 2012 (GU findings benign: atrophic changes, renal cysts) recommended follow-up with General Surgeon to evaluate large ventral hernia.  Patient does not want to see surgeon.  2. Sitting in wheelchair Uses cane at home but uses wheelchair outside the home. Wheelchair is big and bulky and patient would like a prescription for a new one.  Uses cane/wheelchair due to "right leg" problems. Also takes low dose daily narcotic for this.   3. Schizophrenia Patient had been stable on Clozaril for several years but this was discontinued around Thanksgiving 2011 due to concern for side effects. Patient and sister unsure of details. Put on Abilify briefly but this was discontinued due to associated tremors resulting in ED visit. Now on a new medication. Unsure of name. Maybe starts with "L". Last week, saw psychiatrist Dr. Casimiro Needle @ [facility].   4. Lower extremity edema Ordered compression stockings and measured but will take several weeks to come. Wanted to know if this could be expedited. Has been using Eucerin cream for skin changes. Improved.   5. Tobacco Still smoking around 6 cigarettes a day. "Only thing" she does. Relieves anxiety.   Review of Systems     Objective:   Physical Exam  Constitutional: She appears well-developed.       Smells of tobacco  HENT:  Head: Normocephalic and atraumatic.  Cardiovascular: Normal rate, regular rhythm, normal heart sounds and intact distal pulses.   No murmur heard. Pulmonary/Chest: Effort normal and breath sounds normal. No  respiratory distress. She has no wheezes. She has no rales.  Abdominal: Soft. Bowel sounds are normal. She exhibits no distension, no ascites and no mass. There is tenderness in the suprapubic area. There is no rebound, no guarding, no CVA tenderness and negative Murphy's sign. No hernia. Hernia confirmed negative in the ventral area, confirmed negative in the right inguinal area and confirmed negative in the left inguinal area.       Obese  Genitourinary: Vagina normal.  Musculoskeletal:       2-3+ pitting edema halfway up calf.  Shiny skin but no weeping, scaly skin, bruising. No calf tenderness  Skin: Skin is warm and dry. No rash noted. No erythema.  Psychiatric: Her mood appears not anxious. Her affect is inappropriate. Her affect is not angry, not blunt and not labile. Her speech is tangential. She is not agitated, not aggressive, is not hyperactive, not slowed, not withdrawn, not actively hallucinating and not combative. Thought content is not paranoid and not delusional. Cognition and memory are impaired. She does not exhibit a depressed mood. She expresses no homicidal and no suicidal ideation.       Occasionally inappropriate to questions. Difficulty focusing on some questions but appropriate to most questions.  Difficulty remembering medications and only very superficial understanding of medical problems. Looks to sister for guidance occasionally but also occasionally prefers own explanations rather than sister's probably more accurate explanations to certain questions.  She is inattentive.          Assessment & Plan:  Agree patient qualifies  for home health services. Will fill out form and fax.

## 2010-03-27 NOTE — Assessment & Plan Note (Addendum)
Unable to visualize/palpate hernia but present based on CT AP shows periumbilical abdominal wall hernia. This seems most likely cause of abdominal pain at this time.  Initially concern for urological causes however cytology and work-up by urologist in 01/2010 was benign (CT AP and cytology revealed bilateral simple renal cysts and prominent uterine cervix with no signs of renal parenchymal lesions or obstruction). Patient will f/u with them around 04/2010.  Urologist recommended follow-up with surgeon Dr. Kalman Shan to evaluate hernia but patient averse to seeing surgeon since the idea of surgery seems to frighten her. Explained that visit would only be for consultation but still unclear how much patient understands. Sister said she would take her and at the end of interview, patient seemed amenable to plan of seeing surgeon sometime this month (March), and then following up with me in about a month to discuss what surgeon said.  Will check urine culture in case of urine infection in light of history of frequent infections. (Unable to acquire sufficient urine for urinalysis at this time).

## 2010-03-27 NOTE — Patient Instructions (Addendum)
Please see me back in 1 month so we can talk about what Dr. Kalman Shan discussed with you. Please make an appointment with Dr. Kalman Shan.  Please ask Dr. Lowella Fairy office about your potassium level. If your urine shows signs of infection, I will call in an antibiotic for you and let you know.  Continue to use the Eucerin lotion on your legs. They look much better.  I will talk with Dr. Rollene Fare about adding a blood pressure medicine. Either I will call you about this or he can address it at your follow-up visit in 2 weeks.  Please also think about other ways to relax besides smoking.

## 2010-03-27 NOTE — Assessment & Plan Note (Addendum)
Recent LFTs good. LDL 11/2009 in 40s. Will re-check FLP in a year, so 11/2010, and LFTs again at that time if not done sooner.  Continue statin. Patient's pharmacy not in system. May refill for 30 tablets, RF 12.

## 2010-03-27 NOTE — Assessment & Plan Note (Signed)
Started on new medication but uncertain which. Asked patient to call clinic with name of medication some time today. Will request records from Dr. Karen Chafe office for notes from 08/2009 to current.

## 2010-03-27 NOTE — Assessment & Plan Note (Signed)
Stable. Right knee bothers her but is controlled on Vicodin bid.

## 2010-03-27 NOTE — Assessment & Plan Note (Addendum)
Persistent edema but skin changes improved with Eucerin. Continue Eucerin. Patient wearing long tight socks. Continue to use until compression stockings come in (already ordered but per patient will take 6-8 weeks to arrive). Discussed smoking cessation. Patient not currently interested.

## 2010-03-27 NOTE — Assessment & Plan Note (Signed)
Difficult getting urine sample on patient. Will not repeat U/A today but will just use urine for clean catch. Hematuria has been worked up so am not worried about this at this point. Patient has follow-up with urologist around April 2012.

## 2010-03-28 LAB — URINALYSIS, ROUTINE W REFLEX MICROSCOPIC
Glucose, UA: NEGATIVE mg/dL
Protein, ur: >300 mg/dL — AB
Specific Gravity, Urine: 1.021 (ref 1.005–1.030)

## 2010-03-28 LAB — POCT I-STAT, CHEM 8
Calcium, Ion: 1.13 mmol/L (ref 1.12–1.32)
HCT: 46 % (ref 36.0–46.0)
Sodium: 140 mEq/L (ref 135–145)
TCO2: 23 mmol/L (ref 0–100)

## 2010-03-28 LAB — URINE MICROSCOPIC-ADD ON

## 2010-03-30 ENCOUNTER — Encounter: Payer: Self-pay | Admitting: Family Medicine

## 2010-03-30 DIAGNOSIS — I5032 Chronic diastolic (congestive) heart failure: Secondary | ICD-10-CM

## 2010-03-30 DIAGNOSIS — Z950 Presence of cardiac pacemaker: Secondary | ICD-10-CM | POA: Insufficient documentation

## 2010-03-30 LAB — DIFFERENTIAL
Basophils Absolute: 0.1 10*3/uL (ref 0.0–0.1)
Lymphocytes Relative: 31 % (ref 12–46)
Monocytes Relative: 6 % (ref 3–12)
Neutro Abs: 7.5 10*3/uL (ref 1.7–7.7)
Neutrophils Relative %: 60 % (ref 43–77)

## 2010-03-30 LAB — URINALYSIS, ROUTINE W REFLEX MICROSCOPIC
Ketones, ur: NEGATIVE mg/dL
Nitrite: NEGATIVE
Protein, ur: 100 mg/dL — AB
Urobilinogen, UA: 0.2 mg/dL (ref 0.0–1.0)

## 2010-03-30 LAB — CBC
HCT: 40.2 % (ref 36.0–46.0)
Platelets: UNDETERMINED 10*3/uL (ref 150–400)
RDW: 14 % (ref 11.5–15.5)
WBC: 12.3 10*3/uL — ABNORMAL HIGH (ref 4.0–10.5)

## 2010-03-30 LAB — BASIC METABOLIC PANEL
BUN: 14 mg/dL (ref 6–23)
GFR calc non Af Amer: 60 mL/min — ABNORMAL LOW (ref >60)
Potassium: 3.9 mEq/L (ref 3.5–5.1)

## 2010-03-30 NOTE — Assessment & Plan Note (Addendum)
BP on the higher side. Not on any blood pressure medications at this time. Previously, clozaril and valium had been causing lower blood pressures but now blood pressures higher off clozaril. Consider adding ACEi/ARB for BP in this patient with chronic diastolic HF at next visit if BP stays elevated. (NB: had been on lopressor in past). Patient also has f/u with cards in few weeks.

## 2010-03-30 NOTE — Progress Notes (Signed)
  Subjective:    Patient ID: Abigail Wallace, female    DOB: 14-Jan-1946, 65 y.o.   MRN: [id number]  HPI    Review of Systems     Objective:   Physical Exam        Assessment & Plan:  Error

## 2010-04-04 ENCOUNTER — Telehealth: Payer: Self-pay | Admitting: Family Medicine

## 2010-04-04 NOTE — Telephone Encounter (Signed)
Pt asking for lab results

## 2010-04-04 NOTE — Telephone Encounter (Signed)
Forward to MD 

## 2010-04-06 NOTE — Telephone Encounter (Signed)
Pt informed of the note from Dr.[facility]

## 2010-04-11 ENCOUNTER — Encounter (HOSPITAL_COMMUNITY): Payer: Self-pay

## 2010-04-11 ENCOUNTER — Emergency Department (HOSPITAL_COMMUNITY): Payer: Medicare Other

## 2010-04-11 ENCOUNTER — Telehealth: Payer: Self-pay | Admitting: Family Medicine

## 2010-04-11 ENCOUNTER — Emergency Department (HOSPITAL_COMMUNITY)
Admission: EM | Admit: 2010-04-11 | Discharge: 2010-04-11 | Disposition: A | Discharge status: Home / Self Care | Payer: Medicare Other | Attending: Emergency Medicine | Admitting: Emergency Medicine

## 2010-04-11 DIAGNOSIS — E785 Hyperlipidemia, unspecified: Secondary | ICD-10-CM | POA: Insufficient documentation

## 2010-04-11 DIAGNOSIS — N949 Unspecified condition associated with female genital organs and menstrual cycle: Secondary | ICD-10-CM | POA: Insufficient documentation

## 2010-04-11 DIAGNOSIS — Z9089 Acquired absence of other organs: Secondary | ICD-10-CM | POA: Insufficient documentation

## 2010-04-11 DIAGNOSIS — Z79899 Other long term (current) drug therapy: Secondary | ICD-10-CM | POA: Insufficient documentation

## 2010-04-11 DIAGNOSIS — K439 Ventral hernia without obstruction or gangrene: Secondary | ICD-10-CM | POA: Insufficient documentation

## 2010-04-11 DIAGNOSIS — R109 Unspecified abdominal pain: Secondary | ICD-10-CM | POA: Insufficient documentation

## 2010-04-11 DIAGNOSIS — F29 Unspecified psychosis not due to a substance or known physiological condition: Secondary | ICD-10-CM | POA: Insufficient documentation

## 2010-04-11 DIAGNOSIS — F209 Schizophrenia, unspecified: Secondary | ICD-10-CM | POA: Insufficient documentation

## 2010-04-11 DIAGNOSIS — I10 Essential (primary) hypertension: Secondary | ICD-10-CM | POA: Insufficient documentation

## 2010-04-11 DIAGNOSIS — I4510 Unspecified right bundle-branch block: Secondary | ICD-10-CM | POA: Insufficient documentation

## 2010-04-11 LAB — CBC
Hemoglobin: 14 g/dL (ref 12.0–15.0)
MCH: 27 pg (ref 26.0–34.0)
MCHC: 32.9 g/dL (ref 30.0–36.0)
MCV: 82 fL (ref 78.0–100.0)
Platelets: 247 10*3/uL (ref 150–400)

## 2010-04-11 LAB — URINALYSIS, ROUTINE W REFLEX MICROSCOPIC
Leukocytes, UA: NEGATIVE
Protein, ur: 30 mg/dL — AB
Urobilinogen, UA: 0.2 mg/dL (ref 0.0–1.0)

## 2010-04-11 LAB — DIFFERENTIAL
Basophils Relative: 1 % (ref 0–1)
Eosinophils Absolute: 0.3 10*3/uL (ref 0.0–0.7)
Lymphs Abs: 3.6 10*3/uL (ref 0.7–4.0)
Monocytes Absolute: 0.6 10*3/uL (ref 0.1–1.0)
Monocytes Relative: 7 % (ref 3–12)

## 2010-04-11 LAB — COMPREHENSIVE METABOLIC PANEL
AST: 25 U/L (ref 0–37)
BUN: 8 mg/dL (ref 6–23)
CO2: 25 mEq/L (ref 19–32)
Calcium: 9.3 mg/dL (ref 8.4–10.5)
Creatinine, Ser: 1.12 mg/dL (ref 0.4–1.2)
GFR calc Af Amer: 59 mL/min — ABNORMAL LOW (ref >60)
GFR calc non Af Amer: 49 mL/min — ABNORMAL LOW (ref >60)
Total Bilirubin: 1.2 mg/dL (ref 0.3–1.2)

## 2010-04-11 LAB — URINE MICROSCOPIC-ADD ON

## 2010-04-11 LAB — POCT PREGNANCY, URINE: Preg Test, Ur: NEGATIVE

## 2010-04-11 MED ORDER — IOHEXOL 300 MG/ML  SOLN
100.0000 mL | Freq: Once | INTRAMUSCULAR | Status: AC | PRN
  Administered 2010-04-11: 100 mL via INTRAVENOUS

## 2010-04-11 NOTE — Telephone Encounter (Signed)
Asking to speak with RN, pt went to ED last night, some of her meds were switched & she thinks pt is not doing well.

## 2010-04-11 NOTE — Telephone Encounter (Signed)
Received call from patient's sister who is concerned about patient.  Patient went to the ED last night with abdominal pains and told the staff there that she was pregnant.  CT of pelvis was performed and findings were negative.  Patient's sister Lelon Frohlich) is concerned that she is not taking her psych meds which is causing this change in her behavior.  She is wanting [place] to admit her to the [facility] her that we could not force her to be admitted unless she was a danger to herself or others.  Advised her to call Dr. Nicanor Alcon office for advice.  She states that she did and is waiting for them to call her back.  Told her I would route this note to her PCP as an FYI.

## 2010-04-12 ENCOUNTER — Encounter: Payer: Self-pay | Admitting: Family Medicine

## 2010-04-12 ENCOUNTER — Ambulatory Visit (INDEPENDENT_AMBULATORY_CARE_PROVIDER_SITE_OTHER): Payer: Medicare Other | Admitting: Family Medicine

## 2010-04-12 VITALS — BP 113/67 | HR 66 | Temp 97.6°F | Ht 66.0 in | Wt 159.0 lb

## 2010-04-12 DIAGNOSIS — F209 Schizophrenia, unspecified: Secondary | ICD-10-CM

## 2010-04-12 NOTE — Assessment & Plan Note (Signed)
Patient with Schizophrenia with recent changes in medication regimen.  Current increase in paranoid delusions may be due to change in medications vs medication non-compliance.  Sister will try to call and supervise medication administration for the next 1-2 weeks.  Has a home health aid who fills pill boxes once per week.  Patient to follow-up with PCP at next available appt. Will fwd today's note and ER note to Psychiatrist Dr. Casimiro Needle April 16th.  Precepted with Dr. Nori Riis; feels if patient continues to have paranoid delusions at current doseage, may consider increasing Latuda.

## 2010-04-12 NOTE — Progress Notes (Signed)
  Subjective:    Patient ID: Abigail Wallace, female    DOB: [birth year], 65 y.o.   MRN: [id number]  HPIhere for work in appt for ER follow-up for schizophrenia.  Brought by sister today.  She is concerned her sister has not been taking her medications, valium in particular.  Went to ER 3/27 for abdominal pain.  Per chart review from her PCP's last office visit:  "1. Abdominal pain for the past 4 months  With dysuria and hematuria.  Thinks may have a urine infection. Used 12 diapers last night.  Wants a GU exam.  Urologist who worked patient up for hematuria/dysuria in January 2012 (GU findings benign: atrophic changes, renal cysts) recommended follow-up with General Surgeon to evaluate large ventral hernia.  Patient does not want to see surgeon."  Patient is concerned about taking valium as she feels they are dangerous in pregnancy.  She thinks pregnancy may be the cause of her abdominal pain.  She asks for a urinalysis.    I have performed a medication review with the medications she brings today.  She has many medications that are not in our EMR, that appear to be prescribed or refilled by her cardiologist.    Review of Systems  Psychiatric/Behavioral: Positive for behavioral problems and sleep disturbance. Negative for suicidal ideas, hallucinations and dysphoric mood. The patient is nervous/anxious.        Objective:   Physical Exam  Constitutional:       Sitting in wheelchair.  Poor dentition  Psychiatric: Her mood appears anxious. Her speech is tangential. She is not actively hallucinating. Thought content is paranoid and delusional. She expresses inappropriate judgment. She expresses no suicidal ideation.          Assessment & Plan:

## 2010-04-12 NOTE — Patient Instructions (Signed)
The most important medications to take are Latuda and valium.  You can take your Ambien for sleep Follow-up in 1 week with Dr. Verdie Drown.

## 2010-04-13 NOTE — Telephone Encounter (Signed)
Encounter opened in error

## 2010-04-14 LAB — URINE CULTURE: Culture  Setup Time: 201203270848

## 2010-04-18 ENCOUNTER — Ambulatory Visit (INDEPENDENT_AMBULATORY_CARE_PROVIDER_SITE_OTHER): Payer: Medicare Other | Admitting: Family Medicine

## 2010-04-18 ENCOUNTER — Encounter: Payer: Self-pay | Admitting: Family Medicine

## 2010-04-18 DIAGNOSIS — N760 Acute vaginitis: Secondary | ICD-10-CM

## 2010-04-18 DIAGNOSIS — R109 Unspecified abdominal pain: Secondary | ICD-10-CM

## 2010-04-18 DIAGNOSIS — F209 Schizophrenia, unspecified: Secondary | ICD-10-CM

## 2010-04-18 LAB — POCT URINALYSIS DIPSTICK
Bilirubin, UA: NEGATIVE
Nitrite, UA: POSITIVE
Protein, UA: 30
Urobilinogen, UA: 0.2
pH, UA: 5.5

## 2010-04-18 LAB — POCT UA - MICROSCOPIC ONLY

## 2010-04-18 LAB — POCT WET PREP (WET MOUNT)
Clue Cells Wet Prep HPF POC: NEGATIVE
Trichomonas Wet Prep HPF POC: NEGATIVE
Yeast Wet Prep HPF POC: NEGATIVE

## 2010-04-18 MED ORDER — MICONAZOLE NITRATE 2 % EX CREA: TOPICAL_CREAM | CUTANEOUS | Status: DC

## 2010-04-18 NOTE — Assessment & Plan Note (Addendum)
Likely yeast. Patient wears diapers and history of frequent UTIs. Will try antifungal cream to see if this helps. Will f/u on wet prep.

## 2010-04-18 NOTE — Progress Notes (Signed)
  Subjective:    Patient ID: Abigail Wallace, female    DOB: 10-May-1945, 65 y.o.   MRN: [id number]  HPI Patient is accompanied by her sister who usually comes with her to her visits.  Her sister is in a wheelchair following a back injury but is able to ambulate without it.  1. Abdominal pain  Patient still thinks she is pregnant.  She has not seen surgeon yet. Someone told her not to see him and so she keeps cancelling her appointments despite my firm recommendations that she seek the surgeon's opinion as I think her abdominal pain is due to the hernia.  No nausea/vomiting/diarrhea.  Not associated with foods.  No blood in stool.   2. Schizophrenia Patient has delusions of being pregnant. No thoughts of hurting herself or others.                        Review of Systems  Psychiatric/Behavioral: Positive for hallucinations and confusion. Negative for dysphoric mood and decreased concentration.       Objective:   Physical Exam  Abdominal: Bowel sounds are normal. She exhibits mass. There is no tenderness.       Periumbilical hernia on right side. Reducible.   Genitourinary:       Vulvar erythema. Vaginal tenderness but speculum exam shows minimal erythema. No discharge.   Psychiatric: Her mood appears not anxious. Her affect is not angry. Thought content is delusional. Thought content is not paranoid. She exhibits a depressed mood. She expresses no homicidal and no suicidal ideation.       Patient inappropriate to some questions.  Is capable of answering questions but some tangential thinking. Delusions of being pregnant.           Assessment & Plan:

## 2010-04-18 NOTE — Assessment & Plan Note (Signed)
Poorly controlled. Per sister, may need to increase Latuda. Appointment with Dr. Casimiro Needle 04/16. Patient signed ROI so we can get her records.  Not a threat to herself or her sister. But difficultly communicating with patient since she will not believe me when I reassure her she is not pregnant or that her abdominal pain may be due to her hernia.  Will have her follow-up in 1 month.

## 2010-04-18 NOTE — Patient Instructions (Signed)
Please follow-up with me in 1 month.   You are not pregnant. Your abdominal pain is due to the hernia. Please see a surgeon within the month so we can go over what he has discussed.  I think your hernia is the cause of your abdominal pain.  Do not take the antibiotic just yet. I will check your urine and call you if you have an infection.

## 2010-04-18 NOTE — Assessment & Plan Note (Signed)
Still thinks this is due to ventral hernia.  Recommended surgery again. Asked her to see within month. Got clean-catch urine. Will see if she has urine infection. Got Rx for nitrofurantoin filled recently but has not started taking. Asked her to not take until results back.

## 2010-04-19 LAB — URINALYSIS, ROUTINE W REFLEX MICROSCOPIC
Ketones, ur: NEGATIVE mg/dL
Nitrite: NEGATIVE
Protein, ur: 30 mg/dL — AB

## 2010-04-19 LAB — DIFFERENTIAL
Basophils Absolute: 0.1 10*3/uL (ref 0.0–0.1)
Eosinophils Absolute: 2.1 10*3/uL — ABNORMAL HIGH (ref 0.0–0.7)
Eosinophils Relative: 12 % — ABNORMAL HIGH (ref 0–5)
Lymphocytes Relative: 15 % (ref 12–46)
Lymphs Abs: 2.7 10*3/uL (ref 0.7–4.0)
Neutrophils Relative %: 68 % (ref 43–77)

## 2010-04-19 LAB — BASIC METABOLIC PANEL
BUN: 5 mg/dL — ABNORMAL LOW (ref 6–23)
Creatinine, Ser: 0.9 mg/dL (ref 0.4–1.2)
GFR calc non Af Amer: >60 mL/min (ref >60)
Glucose, Bld: 117 mg/dL — ABNORMAL HIGH (ref 70–99)
Potassium: 2.7 mEq/L — CL (ref 3.5–5.1)

## 2010-04-19 LAB — URINE CULTURE

## 2010-04-19 LAB — CBC
HCT: 39.9 % (ref 36.0–46.0)
MCV: 82.1 fL (ref 78.0–100.0)
Platelets: 280 10*3/uL (ref 150–400)
RDW: 15.5 % (ref 11.5–15.5)
WBC: 17.9 10*3/uL — ABNORMAL HIGH (ref 4.0–10.5)

## 2010-04-20 ENCOUNTER — Encounter: Payer: Self-pay | Admitting: Family Medicine

## 2010-04-21 ENCOUNTER — Telehealth: Payer: Self-pay | Admitting: Family Medicine

## 2010-04-21 LAB — URINE CULTURE: Colony Count: >=100000

## 2010-04-21 NOTE — Telephone Encounter (Signed)
Informed patient that she does have urine infection and to take 7-day course of Macrobid she got filled previously but hadn't started.

## 2010-04-23 LAB — CBC
HCT: 39.8 % (ref 36.0–46.0)
MCHC: 33.8 g/dL (ref 30.0–36.0)
MCV: 84.3 fL (ref 78.0–100.0)
Platelets: 279 10*3/uL (ref 150–400)
Platelets: 309 10*3/uL (ref 150–400)
Platelets: 329 10*3/uL (ref 150–400)
RBC: 4.7 MIL/uL (ref 3.87–5.11)
RDW: 15.4 % (ref 11.5–15.5)
RDW: 16 % — ABNORMAL HIGH (ref 11.5–15.5)
WBC: 22 10*3/uL — ABNORMAL HIGH (ref 4.0–10.5)

## 2010-04-23 LAB — BASIC METABOLIC PANEL
BUN: 12 mg/dL (ref 6–23)
BUN: 3 mg/dL — ABNORMAL LOW (ref 6–23)
BUN: 6 mg/dL (ref 6–23)
CO2: 24 mEq/L (ref 19–32)
CO2: 26 mEq/L (ref 19–32)
Calcium: 9 mg/dL (ref 8.4–10.5)
Calcium: 9.6 mg/dL (ref 8.4–10.5)
Chloride: 107 mEq/L (ref 96–112)
Creatinine, Ser: 0.95 mg/dL (ref 0.4–1.2)
GFR calc Af Amer: >60 mL/min (ref >60)
GFR calc non Af Amer: 45 mL/min — ABNORMAL LOW (ref >60)
Glucose, Bld: 156 mg/dL — ABNORMAL HIGH (ref 70–99)
Glucose, Bld: 169 mg/dL — ABNORMAL HIGH (ref 70–99)
Potassium: 4.4 mEq/L (ref 3.5–5.1)

## 2010-04-23 LAB — POCT CARDIAC MARKERS
CKMB, poc: 1.1 ng/mL (ref 1.0–8.0)
CKMB, poc: <1 ng/mL — ABNORMAL LOW (ref 1.0–8.0)
Troponin i, poc: <0.05 ng/mL (ref 0.00–0.09)

## 2010-04-23 LAB — POCT I-STAT 3, ART BLOOD GAS (G3+)
O2 Saturation: 98 %
TCO2: 22 mmol/L (ref 0–100)
pCO2 arterial: 36.8 mmHg (ref 35.0–45.0)
pO2, Arterial: 115 mmHg — ABNORMAL HIGH (ref 80.0–100.0)

## 2010-04-23 LAB — CULTURE, BLOOD (ROUTINE X 2)
Culture: NO GROWTH
Culture: NO GROWTH

## 2010-04-23 LAB — DIFFERENTIAL
Basophils Absolute: 0 10*3/uL (ref 0.0–0.1)
Basophils Absolute: 0.1 10*3/uL (ref 0.0–0.1)
Eosinophils Absolute: 0 10*3/uL (ref 0.0–0.7)
Eosinophils Relative: 0 % (ref 0–5)
Lymphocytes Relative: 26 % (ref 12–46)
Monocytes Absolute: 0.2 10*3/uL (ref 0.1–1.0)
Neutro Abs: 7.8 10*3/uL — ABNORMAL HIGH (ref 1.7–7.7)
Neutrophils Relative %: 65 % (ref 43–77)

## 2010-04-23 LAB — URINE CULTURE

## 2010-04-23 LAB — URINE MICROSCOPIC-ADD ON

## 2010-04-23 LAB — URINALYSIS, ROUTINE W REFLEX MICROSCOPIC
Bilirubin Urine: NEGATIVE
Specific Gravity, Urine: 1.005 (ref 1.005–1.030)
Urobilinogen, UA: 0.2 mg/dL (ref 0.0–1.0)

## 2010-04-23 LAB — D-DIMER, QUANTITATIVE: D-Dimer, Quant: 0.38 ug/mL-FEU (ref 0.00–0.48)

## 2010-04-25 LAB — POCT CARDIAC MARKERS
CKMB, poc: <1 ng/mL — ABNORMAL LOW (ref 1.0–8.0)
Troponin i, poc: <0.05 ng/mL (ref 0.00–0.09)

## 2010-04-25 LAB — HEPATIC FUNCTION PANEL
ALT: 13 U/L (ref 0–35)
AST: 22 U/L (ref 0–37)
Albumin: 3.2 g/dL — ABNORMAL LOW (ref 3.5–5.2)
Total Bilirubin: 0.6 mg/dL (ref 0.3–1.2)

## 2010-04-25 LAB — URINE MICROSCOPIC-ADD ON

## 2010-04-25 LAB — PROTIME-INR
INR: 1 (ref 0.00–1.49)
Prothrombin Time: 12.8 seconds (ref 11.6–15.2)

## 2010-04-25 LAB — DIFFERENTIAL
Basophils Absolute: 0.1 10*3/uL (ref 0.0–0.1)
Basophils Relative: 1 % (ref 0–1)
Eosinophils Absolute: 0.2 10*3/uL (ref 0.0–0.7)
Monocytes Relative: 4 % (ref 3–12)
Neutro Abs: 8.2 10*3/uL — ABNORMAL HIGH (ref 1.7–7.7)
Neutrophils Relative %: 68 % (ref 43–77)

## 2010-04-25 LAB — CBC
MCV: 82.2 fL (ref 78.0–100.0)
Platelets: 298 10*3/uL (ref 150–400)
RBC: 5.34 MIL/uL — ABNORMAL HIGH (ref 3.87–5.11)
WBC: 12.1 10*3/uL — ABNORMAL HIGH (ref 4.0–10.5)

## 2010-04-25 LAB — URINALYSIS, ROUTINE W REFLEX MICROSCOPIC
Bilirubin Urine: NEGATIVE
Leukocytes, UA: NEGATIVE
Nitrite: NEGATIVE
Specific Gravity, Urine: 1.015 (ref 1.005–1.030)
Urobilinogen, UA: 0.2 mg/dL (ref 0.0–1.0)
pH: 6 (ref 5.0–8.0)

## 2010-04-25 LAB — CK TOTAL AND CKMB (NOT AT ARMC)
CK, MB: 0.9 ng/mL (ref 0.3–4.0)
Relative Index: INVALID (ref 0.0–2.5)
Total CK: 46 U/L (ref 7–177)

## 2010-04-25 LAB — BASIC METABOLIC PANEL
Chloride: 108 mEq/L (ref 96–112)
Creatinine, Ser: 0.94 mg/dL (ref 0.4–1.2)
GFR calc Af Amer: >60 mL/min (ref >60)
GFR calc non Af Amer: 60 mL/min — ABNORMAL LOW (ref >60)

## 2010-04-25 LAB — POCT I-STAT, CHEM 8
BUN: 6 mg/dL (ref 6–23)
Calcium, Ion: 1.19 mmol/L (ref 1.12–1.32)
Chloride: 108 mEq/L (ref 96–112)
Creatinine, Ser: 0.9 mg/dL (ref 0.4–1.2)
Glucose, Bld: 140 mg/dL — ABNORMAL HIGH (ref 70–99)
Potassium: 3.1 mEq/L — ABNORMAL LOW (ref 3.5–5.1)

## 2010-04-25 LAB — CARDIAC PANEL(CRET KIN+CKTOT+MB+TROPI)
Relative Index: INVALID (ref 0.0–2.5)
Total CK: 61 U/L (ref 7–177)

## 2010-04-25 LAB — APTT: aPTT: 24 seconds (ref 24–37)

## 2010-04-25 LAB — BRAIN NATRIURETIC PEPTIDE: Pro B Natriuretic peptide (BNP): <30 pg/mL (ref 0.0–100.0)

## 2010-05-01 HISTORY — PX: OTHER SURGICAL HISTORY: SHX169

## 2010-05-02 ENCOUNTER — Telehealth: Payer: Self-pay | Admitting: Family Medicine

## 2010-05-02 NOTE — Telephone Encounter (Signed)
Need to speak with you regarding pt's altered mental status.  Please call asap.

## 2010-05-02 NOTE — Telephone Encounter (Signed)
Called back and had to leave message on voicemail to return call.

## 2010-05-02 NOTE — Telephone Encounter (Signed)
Patient's sister called back with the same concerns voice a couple of weeks ago when patient was worked in.  States that she does not feel that Ms Abigail Wallace. Abigail Wallace is taking her psych meds and is claiming to be pregnant.  Explained to Abigail Wallace that b/c this is a psychiatric issue, there is little we can do.  She needs to be admitted to the [facility].  Patient has an appointment tomorrow with her psychiatrist.  I advised to make sure she gets to that appointment and to not let patient talk her out of it.  Also told her I would route this note to her PCP as an FYI.

## 2010-05-04 ENCOUNTER — Telehealth: Payer: Self-pay | Admitting: Family Medicine

## 2010-05-04 ENCOUNTER — Emergency Department (HOSPITAL_COMMUNITY)
Admission: EM | Admit: 2010-05-04 | Discharge: 2010-05-05 | Disposition: A | Discharge status: Other Acute Inpatient Hospital | Payer: Medicare Other | Attending: Emergency Medicine | Admitting: Emergency Medicine

## 2010-05-04 DIAGNOSIS — F209 Schizophrenia, unspecified: Secondary | ICD-10-CM | POA: Insufficient documentation

## 2010-05-04 DIAGNOSIS — I739 Peripheral vascular disease, unspecified: Secondary | ICD-10-CM | POA: Insufficient documentation

## 2010-05-04 DIAGNOSIS — I10 Essential (primary) hypertension: Secondary | ICD-10-CM | POA: Insufficient documentation

## 2010-05-04 DIAGNOSIS — IMO0002 Reserved for concepts with insufficient information to code with codable children: Secondary | ICD-10-CM | POA: Insufficient documentation

## 2010-05-04 LAB — BASIC METABOLIC PANEL
GFR calc non Af Amer: >60 mL/min (ref >60)
Potassium: 3.2 mEq/L — ABNORMAL LOW (ref 3.5–5.1)
Sodium: 137 mEq/L (ref 135–145)

## 2010-05-04 LAB — ETHANOL: Alcohol, Ethyl (B): <5 mg/dL (ref 0–10)

## 2010-05-04 LAB — DIFFERENTIAL
Basophils Absolute: 0.1 10*3/uL (ref 0.0–0.1)
Basophils Relative: 1 % (ref 0–1)
Lymphocytes Relative: 32 % (ref 12–46)
Monocytes Relative: 7 % (ref 3–12)
Neutro Abs: 5 10*3/uL (ref 1.7–7.7)
Neutrophils Relative %: 58 % (ref 43–77)

## 2010-05-04 LAB — URINALYSIS, ROUTINE W REFLEX MICROSCOPIC
Nitrite: NEGATIVE
Specific Gravity, Urine: 1.007 (ref 1.005–1.030)
Urobilinogen, UA: 0.2 mg/dL (ref 0.0–1.0)

## 2010-05-04 LAB — RAPID URINE DRUG SCREEN, HOSP PERFORMED
Barbiturates: NOT DETECTED
Opiates: NOT DETECTED
Tetrahydrocannabinol: NOT DETECTED

## 2010-05-04 LAB — ACETAMINOPHEN LEVEL: Acetaminophen (Tylenol), Serum: <10 ug/mL — ABNORMAL LOW (ref 10–30)

## 2010-05-04 LAB — CBC
Hemoglobin: 13.4 g/dL (ref 12.0–15.0)
RBC: 4.77 MIL/uL (ref 3.87–5.11)

## 2010-05-04 LAB — SALICYLATE LEVEL: Salicylate Lvl: <4 mg/dL (ref 2.8–20.0)

## 2010-05-04 NOTE — Telephone Encounter (Signed)
pts sister is asking to speak with RN about pts meds, is concerned about pt taking a certain medication since she has diabetes.

## 2010-05-04 NOTE — Telephone Encounter (Signed)
Patient's sister has been calling with lots of questions.  Will route this note to patient's PCP.

## 2010-05-05 NOTE — Telephone Encounter (Signed)
I do not see that Ms. [address] has a diagnosis of diabetes.  I tried to call Lelon Frohlich back but only got her answering machine.  Left a message for her to call us back on Monday.

## 2010-05-08 ENCOUNTER — Encounter: Payer: Self-pay | Admitting: Family Medicine

## 2010-05-19 ENCOUNTER — Ambulatory Visit: Payer: Medicare Other | Admitting: Family Medicine

## 2010-05-29 NOTE — Telephone Encounter (Signed)
Will await pt or Ann to call back, closing encounter.

## 2010-05-30 NOTE — H&P (Signed)
NAME:  Abigail Wallace, Abigail Wallace             ACCOUNT NO.:  [id number]   MEDICAL RECORD NO.:  [id number]          PATIENT TYPE:  INP   LOCATION:  [id number]                         FACILITY:  [facility]   PHYSICIAN:  Dickie La, MD        DATE OF BIRTH:  1945/02/06   DATE OF ADMISSION:  07/20/2008  DATE OF DISCHARGE:                              HISTORY & PHYSICAL   PRIMARY CARE Markasia Carrol:  Eugenie Norrie, MD   CHIEF COMPLAINT:  Dyspnea.   HISTORY OF PRESENT ILLNESS:  The patient is a 65 year old female with a  history of COPD who presents to the ER reporting shortness of breath and  productive cough x5 days.  She also reports chest tightness and pain  that occurs when she coughs.  She states that she has had some dizziness  off and on during the past 5 days.  The patient has had some problems  with incontinence of stool and bladder during coughing episodes.  The  patient complains of suprapubic right lower quadrant pain, pain with  urination, and blood in urine.  She also endorses positive sore throat,  positive nasal congestion, positive nausea, positive chills, decreased  appetite, and diarrhea.   CURRENT MEDICATIONS:  1. Clozapine 100 mg 1 in the morning, 1 at 3 p.m., and 2 tablets at 7      p.m.  2. Valium 5 mg take 1 by mouth at bedtime.  3. Adult aspirin 81 mg daily.  4. Tums 1000 mg 2 times a day.  5. Albuterol 2 puffs every 4 hours as needed for wheezing and      shortness of breath.  6. Zocor 40 mg daily.  7. Vicodin 5/500 mg tablets, half to one tablet 2 times a day as      needed for pain.  8. Prilosec 20 mg 1 by mouth daily.  9. Midodrine HCl 2.5 mg tablets 2 daily.  10.Toprol-XL 25 mg 1 daily.   ALLERGIES:  1. CODEINE.  2. HALDOL.  3. COGENTIN.   PAST MEDICAL HISTORY:  1. Cardiac cath normal on July 2005.  Cardiac cath 2007 for complete      heart block.  Pacer placed in 2007.  Nonobstructive CAD at that      time.  2. Overactive bladder.  3. Schizophrenia.  4. Tobacco  abuse.  5. Proteinuria.  6. Hypercholesterolemia.  7. COPD.  8. Chronic orthostatic hypotension.  9. Significant anxiety.   PAST SURGICAL HISTORY:  1. Bilateral tubal ligation in 1972, cholecystectomy October 30, 2001.      Cystoscopy, no tumor or stone July 03, 1999.  2. Tonsillectomy.   FAMILY HISTORY:  Mother had heart problems.   SOCIAL HISTORY:  The patient is a smoker.  Denies use of drugs or EtOH.  Sister very involved with her medical treatment.  Per PCP medical  record, the patient does not drive, has limited transportation, active.  Risk factors:  She is a smoker and smokes approximately 2 cigarettes per  week.   REVIEW OF SYSTEMS:  Per HPI or otherwise negative on 12 points.   PHYSICAL  EXAMINATION:  VITAL SIGNS:  O2 sat 99% on 2 L, temp 97.7, pulse  104, respiratory rate 20, and BP 109/55.  GENERAL:  In no acute distress.  Alert, appropriate, cooperative  throughout the examination, obese.  HEENT:  Head normocephalic and atraumatic without obvious abnormality.  Eyes, extraocular movements intact.  Vision grossly normal.  Ears, no  gross deformity.  Nose, no external erythema.  Mouth, tacky mucous  membrane.  NECK:  Supple and full range of motion.  CHEST:  Chest wall, no deformities.  LUNGS:  Coarse wheezes and rhonchi present bilateral.  Tachypnea  present.  HEART:  Normal rate and regular rhythm.  S1 and S2 normal without  gallop, murmurs, click, rub, or other extra sounds.  Heart sound is  distant.  ABDOMEN:  Mild suprapubic and right lower quadrant tenderness.  Normal  bowel sounds.  No distention, no masses, no guarding and no rigidity.  MUSCULOSKELETAL:  Moving all 4 extremities.  Pulses 2+ in all  extremities.  No edema.  NEUROLOGIC:  Alert and oriented x3.  SKIN:  Turgor normal and no rashes.  PSYCHIATRIC:  Oriented x3 and normally interactive.   LABORATORY DATA:  Sodium 140, potassium 3.1, chloride 105, bicarb 23,  glucose 156, BUN 3, creatinine 1.21,  calcium 9.0, D-dimer is 0.38.  CBC:  White blood cells 12.0, hemoglobin 14.5, hematocrit 40.2, platelets 279,  AMC 7.8.  Blood culture x2 pending.  Point-of-care cardiac markers,  myoglobin 237.  CK-MB less than 1.  Troponin less than 0.05.  ABG:  A pH  7.366, pCO2 36.8, pO2 115, bicarb 21.1.   STUDIES:  Chest x-ray, no acute cardiopulmonary disease.  EKG, sinus  tachycardia, no Abigail-T wave changes from baseline.  No acute changes from  May 2010.   ER COURSE:  Potassium chloride 60 mEq by mouth, also received  azithromycin and Rocephin in the ER.   ASSESSMENT AND PLAN:  The patient is a 65 year old female who presents  with shortness of breath and cough.  1. Shortness of breath.  Shortness of breath episode most likely      secondary to chronic obstructive pulmonary disease exacerbation.      Chest x-ray showed no acute changes.  Cardiac enzymes negative x1.      EKG showed no new changes.  We will treat with albuterol and      Combivent nebulizers for symptom relief.  We will also treat      inflammatory reaction with Solu-Medrol 60 mg IV 2 times a day.  We      will give Rocephin 1 g IV daily to cover typical organisms for      chronic obstructive pulmonary disease exacerbation.  2. Suprapubic and right lower quadrant pain.  Abdomen exam otherwise      benign.  The patient describes the pain as vague and dull.  The      patient does report blood color in urine.  We will obtain urine      culture to access for urinary tract infection.  We will performed      serial abdominal exams in order to monitor the patient and ensure      that pain is improving.  Will give pt. home medication of Vicodin      as needed for pain control.  3. Nausea.  May be secondary to pain versus secondary to possible      urinary tract infection.  We will treat symptomatically with Zofran  as needed and schedule Prilosec per home regimen and continue      workup of shortness of breath and suprapubic and  right lower      quadrant pain.  We will continue to monitor the patient's symptoms.  4. Hyperlipidemia.  Continue Zocor per home regimen.  5. Schizophrenia.  Continue clonazepam per home regimen.  The patient      was scheduled for outpatient lab work today to ensure that no      adverse effects of this medication is occurring.  She was not able      to go to the lab for this blood draw.  The blood work up is      obtained every month in order for the patient to continue on this      med.  After reviewing the medicines, it seems that the needed blood      work is a CBC to assess for white blood cell level.  We will      obtained this with a.m. labs.  6. Coronary artery disease.  We will continue aspirin therapy.  7. Cardiac.  Continue telemetry monitor and Toprol.      Dorthey Sawyer, MD  Electronically Signed      Dickie La, MD  Electronically Signed    DC/MEDQ  D:  07/20/2008  T:  07/21/2008  Job:  [id number]

## 2010-05-30 NOTE — Discharge Summary (Signed)
NAME:  Abigail Wallace, [place]             ACCOUNT NO.:  [id number]   MEDICAL RECORD NO.:  [id number]          PATIENT TYPE:  INP   LOCATION:  [id number]                         FACILITY:  [facility]   PHYSICIAN:  Talbert Cage, M.D.DATE OF BIRTH:  06-Mar-1945   DATE OF ADMISSION:  05/27/2008  DATE OF DISCHARGE:  05/27/2008                               DISCHARGE SUMMARY   PRIMARY CARE Nelda Luckey:  Mayra Neer, MD at [facility].   DISCHARGE DIAGNOSES:  1. Noncardiac chest pain.  2. Chronic obstructive pulmonary disease.  3. Schizophrenia.  4. Akathisia.  5. Arthritis.  6. Orthostatic hypotension.  7. Coronary artery disease.  8. Chronic renal insufficiency.  9. Chronic diastolic congestive heart failure.  10.Sick sinus syndrome, status post pacemaker.  11.Tobacco abuse.  12.Unsteady gait requiring a walker.   DISCHARGE MEDICATIONS:  1. Clozapine 100 mg p.o. t.i.d.  2. Valium 5 mg p.o. nightly.  3. Aspirin 81 mg p.o. daily.  4. Tums 2 tablets p.o. daily.  5. Albuterol 2 puffs q.4 h. p.r.n. shortness of breath.  6. Fludrocortisone 0.1 mg p.o. daily.  7. Zocor 40 mg p.o. nightly.  8. Metformin 500 mg p.o. daily.  9. Estrace 0.1 mg/g cream as previously directed.  10.Vicodin 5/500 one-half tablet p.o. b.i.d. p.r.n. pain.   CONSULTATIONS:  Cardiology with [facility].   LABORATORY DATA:  Cardiac markers negative x2, D-dimer 0.39, urinalysis  significant only for greater than 300 protein, BNP less than 30,  potassium 3.2, BUN 6, creatinine 0.94.   HOSPITAL COURSE:  1. Shortness of breath:  The patient initially was very poor      historian.  Her chest x-ray was only significant for minimal      atelectasis at the left base.  The patient was not hypoxic or      tachypneic.  Her D-dimer was negative.  Shortness of breath likely      secondary to COPD without an acute exacerbation and/or continued      smoking.  The patient should continue home  albuterol as needed and      did not complain of shortness of breath upon discharge.  2. Chest pain:  The patient had 2 negative sets of point of care      cardiac enzymes as well as 4 more sets of enzymes.  Cardiology was      consulted secondary to right bundle-branch block and change of axis      from a prior EKG. They did not believe that chest pain was cardiac      in etiology and the patient has an outpatient workup planned for      May 18.  As the patient was not complaining of chest pain prior to      discharge and she had negative cardiac markers x4 different sets,      she is deemed stable for discharge.  We will continue her on her      home regimen of aspirin.  The patient has history of orthostatic      hypotension, therefore we will defer starting any other  agents to      Cardiology.  We will continue Zocor.  3. Orthostatic hypotension.  The patient had elevated blood pressures      while hospitalized with no symptoms of orthostasis.  The patient      was evaluated by physical therapy and wasable to ambulate in the      halls of the hospital without any difficulty.  She should continue      using her walker as her baseline.   DISCHARGE INSTRUCTIONS:  1. The patient is to follow up with [facility] as      scheduled on May 18.  2. The patient will give call with a hospital followup appointment at      [facility] within next week.  3. The patient should use walker to ambulate.   DISCHARGE CONDITION:  Stable.   DISPOSITION:  The patient will be discharged home with her sister.      Sherrell Puller, MD  Electronically Signed      Talbert Cage, M.D.  Electronically Signed    TCB/MEDQ  D:  05/27/2008  T:  05/28/2008  Job:  [id number]

## 2010-05-30 NOTE — Discharge Summary (Signed)
NAME:  Abigail Wallace, Abigail Wallace             ACCOUNT NO.:  [id number]   MEDICAL RECORD NO.:  [id number]          PATIENT TYPE:  INP   LOCATION:  [id number]                         FACILITY:  [facility]   PHYSICIAN:  Graciella Belton, MD      DATE OF BIRTH:  01-28-1945   DATE OF ADMISSION:  10/18/2006  DATE OF DISCHARGE:  10/30/2006                               DISCHARGE SUMMARY   ADDENDUM TO DISCHARGE SUMMARY (JOB [id number]):   DISCHARGE MEDICATIONS:  1. Please add Simvastatin 40 mg p.o. daily.  2. Remove ramipril 5 p.o. daily from her discharge medications.  This      is to be held.  The ramipril is to be held.   DISCHARGE INSTRUCTIONS:  Please amend the discharge instructions to read  the following:  The patient will be discharged to home with home health  planned by the son.  The son will provide supervision for the patient  during the evenings with home health and PT and OT during the days.  Times when home health and nursing aides cannot be there, the patient's  niece will be caring for her.  This is a three-week trial, per the son,  who has arranged time off from work in the evenings to care for his  mother to see if she can avoid SNF placement in the short-term.  The  patient and son are in agreement and have discussed that if this trial  fails that SNF placement may be inevitable in the future.   LABORATORY DATA:  Discharge laboratories:  On October 29, 2006, sodium  139, potassium 3.6, chloride 104, bicarb 27, BUN 8, creatinine 0.97,  glucose 115, calcium 9.6.   DISCHARGE DIAGNOSES:  Append to discharge diagnoses:  Urinary tract  infection.   BRIEF HOSPITAL COURSE:  Append brief hospital course between respiratory  and disposition:  Number item urinary tract infection - The patient was  diagnosed with a urinary tract infection during this admission.  Her  urine grew out gram-negative rods and she was treated with Keflex 500 mg  t.i.d. and then switched to nitrofurantoin.      Graciella Belton, MD  Electronically Signed     MO/MEDQ  D:  10/29/2006  T:  10/30/2006  Job:  [id number]

## 2010-05-30 NOTE — Consult Note (Signed)
NAME:  Abigail Wallace, Abigail Wallace             ACCOUNT NO.:  [id number]   MEDICAL RECORD NO.:  [id number]          PATIENT TYPE:  INP   LOCATION:  [id number]                         FACILITY:  [facility]   PHYSICIAN:  Sol Blazing, M.D.DATE OF BIRTH:  1945-02-10   DATE OF CONSULTATION:  DATE OF DISCHARGE:                                 CONSULTATION   REASON FOR CONSULTATION:  Renal consult done for proteinuria.   HISTORY:  The patient is a 58-year white female with a history of  schizophrenia.  She lives alone, has some gait dysfunction.  Was on  Zocor, Valium, albuterol for COPD and Clozapine at home.  She was  admitted on October 3 for symptoms of orthostatic hypotension, possible  dehydration or hypovolemia.  She had progressive weakness and gait  dysfunction presumably due to low blood pressure.  She had been on a  hypertension medication which was stopped about 2 years ago. She  currently is no complaints.   PAST MEDICAL HISTORY:  1. Cholecystectomy October 2003.  2. Heart catheterization in 2005 normal.  3. Schizophrenia.  4. Vaginal prolapse with cystocele.  5. Prior tobacco quit in 2007.  6. History of proteinuria.  7. Hypercholesterolemia.  8. Heart block with a permanent pacemaker in 2007.  9. COPD.   FAMILY HISTORY:  No significant renal disease.   SOCIAL HISTORY:  As above.  No alcohol use, quit smoking.   CURRENT MEDICATIONS:  Aspirin, calcium carbonate, clozapine, diazepam,  fluticasone, Protonix and p.r.n. Vicodin.   REVIEW OF SYSTEMS:  GENERAL:  Denies fever, chills, night sweats.  ENT:  Denies hearing loss, visual change, sore throat, difficulty swallowing.  CARDIORESPIRATORY:  Denies shortness of breath, cough, orthopnea, PND.  GI:  Denies nausea, vomiting, diarrhea, abdominal pain.  GU:  Denies  difficulty voiding.  She has some chronic dysuria.  EXTREMITIES:  No  ankle edema or myalgia, arthralgia.  NEUROLOGIC:  No focal numbness or  weakness.   PHYSICAL EXAMINATION:   GENERAL:  This is a pleasant older female in no  acute distress lying in bed.  She looks like she has some degree of  chronic debility.  VITAL SIGNS:  Blood pressure is 120/63, temperature 97.9, pulse 78,  respirations 20, O2 sat 95% on room air.  SKIN:  Warm, dry without rash.  HEENT:  PERRL.  EOMI.  Throat is moist.  NECK:  Supple without no JVD.  CHEST:  Clear throughout.  CARDIAC:  Regular without murmur or gallop.  ABDOMEN:  Soft, obese, nontender without masses, ascites or bruits.  EXTREMITIES:  Good distal perfusion in the feet.  No signs of vascular  insufficiency.  She has no peripheral edema in the ankles or lower  extremities are upper extremities.  NEUROLOGIC:  She moves all extremities evenly, and she does have some  lower extremity weakness and has not been out of bed recently in the  hospital.   LABORATORY DATA:  Creatinine on admission was 1.44 down to 1.01, BUN is  4, sodium 144, potassium 3.9, chloride 114, CO2 23, glucose 100, GFR  greater than 60, calcium 8.2.  TSH 3.7.  Chest x-ray rotated film, mild  cardiac enlargement, no acute findings.  She had a urine protein to  creatinine ratio 0.85.  She had a urinalysis which showed greater than  300 protein, no white blood cells and no red blood cells.   IMPRESSION:  1. Possible proteinuria.  2..  Acute renal insufficiency, probably due to dehydration, resolved.  1. Schizophrenia, possible chronic dementia.  2. COPD.  3. History permanent pacemaker.   RECOMMENDATIONS:  A 24-hour urine for total protein, and also get a  renal ultrasound.  Would recommend further testing if she has  significant proteinuria.  Will follow.      Sol Blazing, M.D.  Electronically Signed     RDS/MEDQ  D:  10/20/2006  T:  10/21/2006  Job:  [id number]

## 2010-05-30 NOTE — Discharge Summary (Signed)
NAME:  [name], Loucile             ACCOUNT NO.:  [id number]   MEDICAL RECORD NO.:  [id number]          PATIENT TYPE:  INP   LOCATION:  6734                         FACILITY:  [facility]   PHYSICIAN:  Sun A. Walker Kehr, M.D.    DATE OF BIRTH:  07-26-1945   DATE OF ADMISSION:  10/18/2006  DATE OF DISCHARGE:                               DISCHARGE SUMMARY   PRIMARY CARE Iszabella Hebenstreit:  Dr. Derrill Memo at the [facility].   DISCHARGE DIAGNOSES:  1. Hypotension.  2. Schizophrenia.  3. Chronic obstructive pulmonary disease.  4. Congestive heart failure.  5. Coronary artery disease.  6. Hyperlipidemia.   DISCHARGE MEDICATIONS:  1. Clozaril 100 mg p.o. t.i.d.  2. Diazepam 5 mg p.o. q.h.s.  3. Aspirin 325 mg p.o. daily.  4. Calcium supplement and vitamin D supplement p.o. daily.  5. Advair 250/50 one puff inhaled daily.  6. Albuterol 90 mcg inhaler two puffs inhaled every 8 hours as needed.  7. Pantoprazole 40 mg p.o. daily.  8. Ramipril 5 mg p.o. daily.   CONSULTS:  1. Nephrology.  2. Physical therapy.  3. Social work.   PROCEDURES:  1. Right knee two-view on October 18, 2006.  2. Portable chest x-ray x2 on October 19, 2006.  3. Renal ultrasound on October 21, 2006.   DISCHARGE LABORATORIES:  Sodium 143, potassium 3.6, chloride 106, CO2  28, bun 6, creatinine 1.05, calcium 9.2.   BRIEF HOSPITAL COURSE:  This is a 65 year old female who was admitted  with dizziness and orthostatic hypotension secondary to dehydration on  October 18, 2006.   #1 - HYPOTENSION.  She was rehydrated and is now normotensive.  Dehydration was due to poor p.o. intake secondary to inability to void  without assistance and only has help in the home 3 hours per day, 5 days  per week.  This represents a very unsafe situation for the consideration  of discharge to home.   #2 - RENAL.  Acute renal failure was prerenal and is now resolved.  Ultrasound found renal cysts, to be followed up as an  outpatient.  Twenty-four-hour urine protein was high but in the non-nephrotic-  proteinuria range so no indication for renal biopsy.  The patient is to  avoid NSAIDs and COX-2 inhibitors, have started ramipril 5 mg p.o. daily  per renal consult.  Caution on using this if volume contraction  continues to be a risk for this patient.   #3 - SCHIZOPHRENIA.  Continue her home dose of Clozaril.   #4 - CARDIAC.  Echocardiogram showed no left ventricular dysfunction.  Zocor was held during this admission due to risk for hemorrhagic stroke.  She has a pacer for her heart block.   #5- RESPIRATORY.  Continue fluticasone and salmeterol for COPD and  albuterol as needed.   #6 - DISPOSITION.  The patient lives alone and physical therapy notes  that she requires two people to assist in getting up, does not seem  realistic to return home even with visiting help.  Have discussed short-  term placement in skilled nursing facility with the patient,  who is  resistant at this point.  She says she has a lot of family support but  it seems that she may need more support than this family can provide.  Her FL-2 form has been completed and we are meeting with the son on  October 24, 2006, at 3:30 p.m. to discuss placement.  The patient prefers  to be placed at [facility] but social advises the patient is Medicaid  only and [facility] may not have Medicaid beds available.  Also, she  needs a level 2 PASARR authorization which can take up to a week to find  placement into a SNF.  Son is considering other options at this point  including senior daycare and then caring for her at night.  The  patient's skilled nursing facility prospects could be better if she had  Medicare as well as Medicaid.  Will discus this option with the son at  discharge.   DISCHARGE INSTRUCTIONS:  The patient will be discharged to skilled  nursing facility to be determined.  She needs assistance for walking and  also should be considered  a fall risk; fall risk precautions should be  put into place for her.  She can keep a low-sodium heart-healthy diet  with no other restrictions.  A followup appointment has not been  scheduled at this time but on discharge from the skilled nursing  facility she should schedule a followup appointment with her primary  care physician, Dr. Derrill Memo at the [facility],  phone number [phone].   DISCHARGE CONDITION:  The patient was discharged to SNF in stable  medical condition.      Graciella Belton, MD  Electronically [place] A. Walker Kehr, M.D.  Electronically Signed    MO/MEDQ  D:  10/24/2006  T:  10/24/2006  Job:  [id number]   cc:   Mayra Neer, M.D.

## 2010-05-30 NOTE — Discharge Summary (Signed)
NAME:  Abigail Wallace, Abigail Wallace             ACCOUNT NO.:  [id number]   MEDICAL RECORD NO.:  [id number]          PATIENT TYPE:  INP   LOCATION:  [id number]                         FACILITY:  [facility]   PHYSICIAN:  Dickie La, MD        DATE OF BIRTH:  04/28/1945   DATE OF ADMISSION:  07/20/2008  DATE OF DISCHARGE:  07/22/2008                               DISCHARGE SUMMARY   PRIMARY CARE Jontavius Rabalais:  Eugenie Norrie, MD at [facility].   DISCHARGE DIAGNOSES:  1. Shortness of breath.  2. Schizophrenia.  3. Hyperlipidemia.   DISCHARGE MEDICATIONS:  1. Doxycycline 100 mg p.o. b.i.d. for 7 more days, that is a new      prescription.  2. Clozapine 100 mg, 1 in the morning, 1 at 2 p.m., and 2 at 7 p.m.,      that was a refilled prescription.  3. Prednisone 20 mg 2 per dose, that is a new prescription.  4. Valium 5 mg p.o. at bedtime.  5. Aspirin 81 mg p.o. daily.  6. Albuterol 2 puffs q.4 h. as needed.  7. Zocor 40 mg daily.  8. Vicodin 5/500 mg one-half to one pill 2 times daily as needed for      pain.  9. Prilosec 20 mg daily.  10.Midodrine hydrochloride 2.5 mg p.o. b.i.d.  11.Toprol-XL 25 mg p.o. daily.   Procedures included on July 20, 2008, a digital chest x-ray, which showed  no acute abnormalities.   LABORATORY DATA:  On admission, microscopic urine was done that appeared  to be normal except for a few yeasts seen.  White blood cell count on  July 22, 2008, was 22, but the patient had been on Solu-Medrol since  admission.   Urine culture done showed no predominant organisms, recollection was  advised that was clinically indicated.   Preliminary blood culture showed no growth to date.   BRIEF HOSPITAL COURSE:  This is a 65 year old female with a history of  COPD who was in for admission due to shortness of breath.  1. Shortness of breath, unclear etiology, probably a COPD      exacerbation, but questionable atypical pneumonia.  On admission,      the patient was started on  Rocephin and Zithromax which was then      discontinued, and she was switched to p.o. doxycycline 100 mg      b.i.d.  She was given albuterol as needed and Combivent 1 puff      daily.  She was also on started on Solu-Medrol.  Chest x-ray was      negative.  The patient had no fevers during her stay and workup for      infection was essentially negative.  Oxygen sat on room air was 96%      at discharge.  The patient's shortness of breath clinically      improved and per the patient improved after 2 days.  She was sent      home with prescription for doxycycline and a prednisone taper dose.      The patient was instructed  to follow up with her PCP.  2. Schizophrenia.  Dr. Casimiro Needle follows this patient.  She is continued      on her clozapine for her home regimen during her hospital stay.      The patient was very concerned about running out of her medicines      once she was discharged, and she stated several times that we      needed to get a hold of Dr. Casimiro Needle and sent him to lab.  I tried      to call his office several times, never was able to speak with      anybody.  We did get several CBCs in the hospital and they will be      in the patient's record if Dr. Casimiro Needle needs to see those.  I also      wrote the patient for a refill of 1 month on her script of      clozapine.  She is to follow up with Dr. Casimiro Needle for this.  3. Suprapubic pain and right lower quadrant pain.  This was noted on      her admission, however, the problem resolved within 1 day of her      hospital stay.  UA was negative.  Microscopy showed it was normal      except for few yeasts.  Urine culture showed no predominant      organisms, recollection was indicated that was clinically      significant which it was not.  The patient had no problems      urinating, no pain on urination.  She had no signs or symptoms of      any urinary tract infections throughout her stay.  4. Hyperlipidemia.  The patient was  continued on her home dose of      Zocor.   DISCHARGE INSTRUCTIONS:  The patient was instructed to continue to take  her doxycycline 100 mg p.o. twice a day for the next 7 days.  She was  also given a prescription for prednisone, she was instructed on how to  take that and she is to continue that for 5 days.  The patient was also  advised to stop smoking.   PENDING RESULTS:  Blood cultures x2.   FOLLOWUP APPOINTMENTS:  The patient was scheduled to see Dr. Sarita Haver,  her PCP at the [facility] for next week, she  has a scheduled appointment with him.  The patient also needs to follow  up with Dr. Casimiro Needle.   DISCHARGE CONDITION:  The patient was discharged to home in stable  medical condition.  I instructed to return to the ER if she has any  increasing shortness of breath or any other medical problems that she  needs a support.      Augusto Gamble, DO  Electronically Signed      Dickie La, MD  Electronically Signed    BB/MEDQ  D:  07/23/2008  T:  07/24/2008  Job:  [id number]   cc:   Norma Fredrickson, M.D.

## 2010-06-02 NOTE — Cardiovascular Report (Signed)
NAME:  Abigail Wallace, Abigail Wallace             ACCOUNT NO.:  [id number]   MEDICAL RECORD NO.:  [id number]          PATIENT TYPE:  INP   LOCATION:  [id number]                         FACILITY:  [facility]   PHYSICIAN:  Octavia Heir, MD  DATE OF BIRTH:  05/10/1945   DATE OF PROCEDURE:  09/28/2005  DATE OF DISCHARGE:                              CARDIAC CATHETERIZATION   PROCEDURE:  1. Left heart catheterization.  2. Coronary angiography.   COMPLICATIONS:  None.   INDICATIONS:  [name] is a 65 year old female patient of Dr. Sherren Mocha D.  McDiarmid with a history of schizophrenia on chronic antipsychotic  medications, history of hypertension who was admitted to the ER on September 28, 2005 with a complaint of chest pain and noted to be in complete heart  block.  She has had remote cardiac catheterization in 2005 of a noncritical  CAD but has continued to smoke and has had ongoing complaints of chest pain.  She is now brought for cardiac catheterization to rule out significant CAD  prior to placement of a dual-chamber pacer.   DESCRIPTION OF PROCEDURE:  After getting informed consent, the patient was  brought to the cardiac catheterization where left chest and right groin were  shaved, prepped and draped in the usual sterile fashion.  EEG monitor  established.  1% lidocaine was then used to anesthetize the right femoral  area.  A 6-French arterial sheath was inserted in the right femoral artery.  A 6-French diagnostic catheter was used to perform diagnostic angiography.  The left main is a large vessel with no significant disease.  The LAD is a  large vessel that courses to the apex to one diagonal branch.  The LAD is  coarsely irregular but has no high-grade stenosis.   First diagonal is a medium-sized vessel with no significant disease.  The  left circumflex is a medium to large-sized vessel which courses to the AV  groove and goes to one obtuse marginal branch.  The AV groove circumflex is  noted  to have mild 30% proximal narrowing but no further high-grade  stenosis.   The first OM is a medium to large-sized vessel which bifurcates into the mid-  segment with mild 30% proximal narrowing.  The right coronary artery is a  large vessel which is dominant.  It gives rise to a PA as well as  posterolateral branch.  There is mild 30% mid-vessel RCA narrowing.  There  is no significant disease in the PD and posterolateral branch.   There is no left ventriculogram performed secondary to equipment failure.  The patient was subsequently transferred to a separate cardiac  catheterization for implant of the permanent pacemaker.   CONCLUSION:  1. Noncritical coronary artery disease.  2. Complete heart block.      Octavia Heir, MD  Electronically Signed     RHM/MEDQ  D:  09/28/2005  T:  09/30/2005  Job:  [id number]   cc:   Blane Ohara McDiarmid, M.D.

## 2010-06-02 NOTE — Group Therapy Note (Signed)
NAME:  Abigail Wallace, Abigail Wallace             ACCOUNT NO.:  [id number]   MEDICAL RECORD NO.:  [id number]          PATIENT TYPE:  WOC   LOCATION:  [facility]                   FACILITY:  [facility]   PHYSICIAN:  Andrew Au, MD        DATE OF BIRTH:  1945/03/03   DATE OF SERVICE:  09/12/2005                                    CLINIC NOTE   The patient is a 65 year old white female gravida 3, para 3-0-1-3, history  tubal ligation, cholecystectomy, schizophrenic, with many symptoms, but the  ones concerning her most seem to be related to her urinary urgency, stress  incontinence and pelvic pressure.  She was seen by Dr. Amalia Hailey, a urologist,  who evaluated her completely, did not think she had interstitial cystitis,  and her last visit here we did a urine culture which showed greater than  100,000 colony count Citrobacter which was resistant to many drugs.  She was  supposed to come in for an injection, she could not make it, so Vantin was  prescribed which has not been successful.  We are going to give her a shot  of Rocephin today to try and control that and see if we can help her with  Urised and she is allergic to [place].  Also, she had been complaining of  lack vaginal discharge.  On examination, the vagina is somewhat atrophic,  the cervix is atrophic.  The uterus is anterior, normal size, shape,  consistency.  Adnexa could not be palpated.  The uterus was sounded to a  depth of 8 cm and endometrial biopsy was obtained with little to no tissue  in this postmenopausal patient.  The vagina was clean, although atrophic.  The abdomen was soft, bloated, with what may be a big ventral hernia on the  right side.  She seems to need an internist.  We are going to see if we can  get her set up at [facility] and she does have insurance.   IMPRESSION:  Chronic urinary symptoms with pelvic pain.           ______________________________  Andrew Au, MD     PR/MEDQ  D:  09/12/2005  T:  09/13/2005  Job:   [id number]

## 2010-06-02 NOTE — Discharge Summary (Signed)
NAME:  Abigail Wallace, Abigail Wallace                       ACCOUNT NO.:  [id number]   MEDICAL RECORD NO.:  [id number]                   PATIENT TYPE:  OIB   LOCATION:  5705                                 FACILITY:  [facility]   PHYSICIAN:  Ulice Dash R. Einar Gip, M.D.                  DATE OF BIRTH:  08/13/1945   DATE OF ADMISSION:  08/06/2003  DATE OF DISCHARGE:  08/07/2003                                 DISCHARGE SUMMARY   ADMISSION DIAGNOSES:  1. Atypical chest pain.  2. Tobacco abuse.  3. Hypertension by history.  4. Hyperlipidemia.  5. Schizophrenia versus bipolar type mental disorder.   DISCHARGE DIAGNOSES:  1. Atypical chest pain with normal cardiac catheterization and ejection     fraction of 60%.  2. Hypertension by history.  3. Hyperlipidemia with LDL 133.  4. Tobacco abuse.  5. Bipolar versus schizophrenic mental disorder.   PROCEDURE:  Cardiac catheterization, August 06, 2003, Dr. Einar Gip.   BRIEF HISTORY:  The patient is a 65 year old white female, self-referred,  with complaints of chest discomfort.  History is extremely difficult to  obtain.  She apparently had some kind of stress Cardiolite and told it was  normal, but presented to our office on July 13, 2003 complaining of  worsening chest discomfort, left anterior chest, and under her right breast  and right upper back.  She has nausea, sometimes shortness of breath, she  has bilateral arm pain at night which is different from her chest pain, and  appeared to be a different issue.  A good history was difficult to evaluate  based on the patient's presentation.   PAST MEDICAL HISTORY:  1. Blood pressure problems.  2. Chest pain.  3. Anxiety.  She is followed by mental health and we do not know exactly     what her psychiatric issues are, we have no information available for     that.  4. She has a history of recurrent UTIs and is followed by Dr. Amalia Hailey for     these.  5. Tubal ligation.  6. Cholecystectomy.  7. Some type of arm  surgery.  8. She has had back pain, difficulty walking specifically with right leg.     She is unsure what causes this.   FAMILY HISTORY:  She was unsure.   SOCIAL HISTORY:  She is divorced, she has 3 children, 11 grandchildren,  lives with alone.  She does not drive.  She is disabled.  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  She does not use alcohol.  She does smoke one pack of  cigarettes per day.  No history of drug use.   MEDICATIONS ON PRESENTATION:  1. Diazepam 2 mg daily.  2. Norvasc 5 mg daily.  3. Clozaril 100 mg b.i.d.  4. Ibuprofen as needed.  5. Tylenol as needed.   ALLERGIES:  HALDOL AND COGENTIN.   For further history and physical,  please see the admission note.   HOSPITAL COURSE:  The patient was admitted and underwent cardiac  catheterization on August 06, 2003.  The lV was normal with an EF estimated at  60%.  There was no significant MR right coronary artery was large caliber  and normal.  The left main was a large caliber with mild calcification, but  no obstruction.  The LAD is large caliber vessel in the mid-segment.  Irregularities of the large diagonal, but it was otherwise smooth and  normal, left circ was also normal and gave off one moderate size OM.  Abdominal aortogram revealed two renal arteries, one on each side, both  widely patent.  There was no evidence of abdominal aortic aneurysm.  The  patient was hydrated overnight, she was mobilized the next morning, and if  she ambulates well we plan to discharge her home.  The following a.m. she  had some mild wheezing, cath site was slightly bruised, but otherwise  normal.  There was no false aneurysm sounds.  We will plan to discharge her  home on her home meds along with Pepcid and we will give her the over the  counter five dosages so she can take 2 b.i.d.  We will also start her on  Lipitor 10 mg daily.  She will follow up with Dr. Einar Gip in two weeks.  Her  labs are pending at the time of this  dictation.      Lydia Guiles, P.A.                 Eden Lathe. Einar Gip, M.D.    WDJ/MEDQ  D:  08/07/2003  T:  08/08/2003  Job:  [id number]   cc:   [facility]   Beatrice Lecher, M.D.  [address] [place], [place] [address]  Fax: [phone]

## 2010-06-02 NOTE — Op Note (Signed)
NAME:  Abigail Wallace, Abigail Wallace             ACCOUNT NO.:  [id number]   MEDICAL RECORD NO.:  [id number]          PATIENT TYPE:  INP   LOCATION:  [id number]                         FACILITY:  [facility]   PHYSICIAN:  Octavia Heir, MD  DATE OF BIRTH:  April 15, 1945   DATE OF PROCEDURE:  09/28/2005  DATE OF DISCHARGE:                                 OPERATIVE REPORT   PROCEDURE:  Insertion of a Medtronic Vitatron generator, model #T60DR,  serial [id number], with passive atrial and ventricular leads.   ATTENDING SURGEON:  Octavia Heir, MD.   COMPLICATIONS:  None.   INDICATIONS:  Abigail Wallace is a 65 year old female with a history of  schizophrenia, who was admitted on September 28, 2005 with a complete heart  block and chest pain.  She underwent cardiac catheterization that revealed  noncritical CAD.  She is now appropriate for dual-chamber pacer secondary to  complete heart block.   DESCRIPTION OF OPERATION:  After giving informed written consent, the  patient was brought to the cardiac cath lab, where the left chest was  prepped and draped in sterile fashion.  ECG monitoring was established.  Lidocaine 1% was then used to anesthetize the left midsubclavicular area.  Next, approximately a 3-cm incision was carried out beneath the left  clavicle, and blunt dissection was used to carry this down to the left  pectoral fascia.  Next, an approximately 3 x 4-cm pocket was created over  the left deltopectoral fascia, and hemostasis was obtained with  electrocautery.  The left subclavian vein was then easily entered, and a  guide wire was then easily passed into the left subclavian and right atrium.  A second guide wire was then easily introduced without difficulty.  Four  silk sutures were then placed at the base of the retained wires.  Next, a #7-  [place] safety sheath was introduced over the first guide wire, and the guide  wire and dilator were removed.  Over this, a 58-cm passive Medtronic lead,  model [id number], serial # [id number], was then passed into the right atrium, and  the peel-away sheath was removed.  A second 7-French dilator and sheath were  then inserted over the second retained guide wire, and the guide wire and  dilator were removed.  Following this, a 53-cm passive Medtronic lead, model  #5594, serial [id number], was then passed into the right atrium and the  peel-away sheath was removed.  A J curve was then placed on the ventricular  lead stylet, and the ventricular lead was then positioned in the RV apex  without difficulty.  Thresholds were then determined.  R waves were measured  at 11.9 mV.  Impedance was 729 ohms.  Threshold in the ventricle was 0.3 V  at 0.5 msec.  Current was 0.6 mA, and 10 V was negative for diaphragmatic  stimulation.  The atrial lead was then positioned in the right atrial  appendage, and thresholds were determined.  P waves were measured at 2.1 mV.  Impedance was 485 ohms.  Threshold was 0.9 V at 0.5 mV.  Current was 2.2 mA.  And 10  V was negative for diaphragmatic stimulation.  The leads were then  connected to 2 silk sutures and anchored to the left pectoral fascia.  The  pocket was then copiously irrigated with 1% kanamycin solution.  The leads  were then connected in a serial fashion to a Medtronic Vitatron [id number]  generator, model [id number].  Header screws were tightened and pacing was  confirmed.  A single silk suture was then placed in the apex of the pocket.  The generator and leads were then delivered into the pocket and the header  was secured to the silk suture.  The subcutaneous layer was then closed  using running 2-0 Vicryl.  The skin was then closed using running 4-0  Vicryl.  Steri-Strips were then applied.  The patient returned to the  recovery room in stable condition.   CONCLUSIONS:  Successful implant of a Medtronic Vitatron [id number] generator,  serial [id number], with passive atrial and ventricular leads.      Octavia Heir, MD  Electronically Signed     RHM/MEDQ  D:  09/28/2005  T:  09/28/2005  Job:  [id number]   cc:   Blane Ohara McDiarmid, M.D.

## 2010-06-02 NOTE — Group Therapy Note (Signed)
   NAME:  Abigail Wallace, Abigail Wallace                         ACCOUNT NO.:  [id number]   MEDICAL RECORD NO.:  [id number]                   PATIENT TYPE:  OUT   LOCATION:  [facility]                           FACILITY:  [facility]   PHYSICIAN:  Andrew Au, MD                     DATE OF BIRTH:  18-Jan-1945   DATE OF SERVICE:                                    CLINIC NOTE   SUMMARY:  This patient is a 65 year old gravida 4 para 4-0-0-4 who was sent  to [place] by Dr. Melanee Left from the family practice clinic because of a complaint of  pelvic pain.  She has a vaginal prolapse and a cystocele, is a schizophrenic  who is very fixated on her symptoms according to Dr. Melanee Left, and she would  like to know whether we could put in a pessary.  I examined the patient and  she does have a significant first to second degree uterine prolapse with a  first degree cystocele.  The uterus did not appear to be large on bimanual  examination and a Pap smear was taken.  External genitalia is normal and BUS  within normal limits.  After showing the patient some pessaries and  discussing with her she decided she did not want it in, she was more  concerned about the abdominal pain, and she says she feels bloated all the  time.  She has frequent bouts of diarrhea but does not have any problem with  constipation, and on her past history she had a cholecystectomy.  The  patient is anxious to have an ultrasound, she asked several times during our  visit, and we will comply with that.  We will order a pelvic abdominal  ultrasound and if any abnormality probably follow that up with a CAT scan.   IMPRESSION:  1. Symptomatic uterine prolapse, first or second degree, with first degree     cystocele.  2. Abdominal pain.                                               Andrew Au, MD    PR/MEDQ  D:  08/18/2002  T:  08/19/2002  Job:  [id number]

## 2010-06-02 NOTE — Discharge Summary (Signed)
NAME:  [name], Renita             ACCOUNT NO.:  [id number]   MEDICAL RECORD NO.:  [id number]          PATIENT TYPE:  INP   LOCATION:  [id number]                         FACILITY:  [facility]   PHYSICIAN:  Ermalinda Memos        DATE OF BIRTH:  January 10, 1946   DATE OF ADMISSION:  09/28/2005  DATE OF DISCHARGE:  10/01/2005                                 DISCHARGE SUMMARY   ATTENDING:  Dr. Azzie Roup.   PRIMARY CARE PHYSICIAN:  Dr. Cathie Olden.   REASON FOR ADMISSION:  Chest pain and shortness of breath.   DISCHARGE DIAGNOSES:  1. Complete heart block status post pacer placement.  2. Coronary artery disease, noncritical.  3. Urinary tract infection.  4. COPD.  5. Hyperlipidemia.  6. Schizophrenia.  7. Tobacco abuse.  8. Migrating pain symptoms.  9. Ataxia secondary to pain.   DISCHARGE MEDS:  1. Aspirin 325 mg p.o. daily.  2. Albuterol metered dose inhaler 2 puffs q.4 hours p.r.n.  3. Advair 250/50 one puff b.i.d.  4. Clozaril 100 mg p.o. b.i.d. and 200 mg p.o. q.h.s.  5. Valium 5 mg p.o. q.h.s.  6. Ampicillin 500 mg p.o. t.i.d. x9 days for urine infection.  7. Zocor 40 mg p.o. daily.  8. Toprol-XL 25 mg daily.  9. Chantix 0.5 mg daily x4 days, then 0.5 mg twice daily x3 days, then 1      mg twice daily thereafter.   DISCHARGE FOLLOWUP:  1. Cecilie Kicks, NP, for pacer site check on October 05, 2005, at 9 a.m.      Phone number [phone].  2. Dr. Cathie Olden on October 09, 2005, at 9:15 a.m.  Phone      number [phone].   DISPOSITION:  Patient was discharged home with the help of her son, Standley Dakins.  [place].  The patient will be spending the night at her sister's house.  Home  health will set up for PT, RN and personal care assistance.  This team will  evaluate the patient's needs at home and assure the patient is taking her  medications as prescribed daily and from there decide how often the RN and  personal care assistant need to visit the patient.   FOLLOWUP ISSUES:  The patient has an appointment through cardiology for her  pacer placement and also an appointment with her PCP to follow up on her  hospitalization issues.  There was some concern that due to the patient's  history of schizophrenia she may not be taking her medications correctly as  prescribed.  Her son, Starlyn Skeans, reports that his mother usually takes  her medications as prescribed.  However, during her hospitalization when  asked about many of her medications, the patient was unsure of which  medicine she was on, the dosage, etc.  Home health has been set up to help  make sure the patient has appropriate followup and is receiving her  medications.   DISCHARGE LABS:  Sodium 139, potassium 3.7, chloride 110, bicarb 24, BUN 4,  creatinine 1.1, glucose 115, calcium 8.2, WBC 13.9, hemoglobin 11.7,  hematocrit 34.9,  platelets 269, cholesterol 106, triglycerides 154, HDL 40,  LDL 35, CK within normal limits, CK-MB within normal limits, troponin 0.08  elevated to 0.13.  Urine culture positive enterococcus, which is sensitive  to ampicillin, Levaquin, vancomycin, and nitrofurantoin.   HOSPITAL COURSE:  1. Complete heart block.  [name] is a 65 year old female who      presented to the family practice center complaining of chest pain and      shortness of breath and was directly admitted to [facility].      She was found to be in complete heart block and cardiology was      consulted.  Given her history of coronary artery disease, the patient      had a left heart catheterization and coronary angiography.  These      studies showed non-critical coronary artery disease.  Cardiology      thought the patient was a candidate for pacer placement.  The insertion      of a Medtronic Vitatron generator was performed on September 28, 2005.      The patient remained stable thereafter with resolution of her heart      block.  She has an appointment to follow with her  cardiologist on      October 05, 2005, for a pacer site check.  The patient was ready for      discharge on a cardiology standpoint but stayed an extra day due to a      febrile episode.  Please see number 3.  2. Coronary artery disease.  The patient has a history of CAD and was      status post a normal catheterization in July 2005.  Given her      presentation of chest pain and new complete heart block, she had a full      cardiology workup on this admission.  As stated above, the      catheterization and angiography showed non-critical CAD.  The patient      was previously on amlodipine 10 mg daily of her home medication, which      was held on admission.  She was also on a home dose of aspirin 81 mg,      which was increased on admission to 325 mg.  A beta blocker (Toprol-XL)      was added on her admission given its evidence of decreased mortality in      patient's with CAD.  The patient remained hemodynamically stable after      the pacer placement.  She was [facility] home on Toprol and was told to      discontinue her amlodipine.  She was also discharged home on aspirin      325.  The cardiologist should decide if Toprol should in fact be      continued as an outpatient and which aspirin dosage is necessary for      the patient.  3. UTI.  The patient was found to be febrile during her admission.  During      her workup, she was found to have a suspicious UA.  Empiric antibiotics      with Ancef was initially started.  The following day, her urine culture      grew enterococcus sensitive to ampicillin.  The patient remained      asymptomatic with no dysuria or urinary complaints and afebrile for      over 24 hours on the day of discharge.  She was discharged home on      ampicillin p.o. and was told to complete a 10-day course of      antibiotics.  4. COPD.  The patient has a history of COPD and was encouraged during this     admission to quit smoking.  During counseling for smoking  cessation,      the patient was interested in any form of medical therapy to help.  She      was informed about Chantix and a prescription was given.  She is to      continue Advair b.i.d. and albuterol p.r.n. at her previous home      regimen.  5. Hyperlipidemia.  The patient has a history of hyperlipidemia.  Her      lipid panel on this admission was a cholesterol 106, triglycerides 154,      HDL 40, and LDL of 35.  We continued Zocor on admission and informed      the patient she should continue Zocor on an outpatient basis.  6. Schizophrenia.  The patient has a history of schizophrenia and      currently reports living alone with no assistance.  Her home      psychiatric medicines include Clozaril and Valium.  There was concern      during this admission if the patient was actually taking her      medications as prescribed at home given her mental status and      confusion.  The patient was unable to explain her full home regimen and      was confused about several of her medications, especially her inhalers.      On the day of discharge, her son her reported that the patient was      actually pretty good at taking her medications as far as he was      concerned, but of course, admitted that no one was there to monitor      that the patient was taking her medications daily.  We have arranged      for home health to have a PT/RN and personal care assistance to assess      the patient's needs at home and to help ensure that the      patient is taking her medications appropriately.  7. Tobacco abuse.  The patient has a history of tobacco abuse and COPD as      above.  The patient was interested in smoking cessation and a      prescription for Chantix was given.           ______________________________  Ermalinda Memos     CB/MEDQ  D:  10/01/2005  T:  10/02/2005  Job:  [id number]   cc:   Starleen Blue, M.D.

## 2010-06-02 NOTE — Group Therapy Note (Signed)
NAME:  Abigail Wallace, Abigail Wallace             ACCOUNT NO.:  [id number]   MEDICAL RECORD NO.:  [id number]          PATIENT TYPE:  WOC   LOCATION:  [facility]                   FACILITY:  [facility]   PHYSICIAN:  Andrew Au, MD        DATE OF BIRTH:  1945/10/14   DATE OF SERVICE:                                    CLINIC NOTE   SUBJECTIVE:  The patient is a 65 year old white female gravida 4, para 3-0-1-  3 with a history of tubal ligation and cholecystectomy.  She is  schizophrenic with many symptoms but the ones that concern her most seem to  be related to urinary urgency, stress incontinence, and pelvic pressure.   OBJECTIVE:  On examination the external genitalia is normal, BUS within  normal limits, vagina is markedly atrophic and the uterus is a second-degree  prolapse could not be well outlined because of habitus of the patient, i.e.,  225 pounds, 5 feet 6 inches.  Adnexa could not be palpated.  The abdomen is  rotund and somewhat bloated/gas filled.  The patient has been treated for  multiple urinary tract infections and has a family history that concerns  her.  She keeps thinking her bladder and her kidneys are involved.  She has  been documented with urinary tract infections in the past.  She has dysuria  which they tried to treat but she could not tolerate the Pyridium - at  least, I think it was Pyridium.  I am suggesting this patient gets a pelvic  ultrasound so we can start from scratch.  We did a Pap smear.  I want a  urine culture as well as a urinalysis and I would like to get her urinary  records from the urologist.  I will have her come back in 2 weeks so we can  discuss a plan.  Off hand I would think probably vaginal hysterectomy and  the possibility of a bladder repair might be a consideration.   IMPRESSION:  1.  Urinary incontinence with urgency and stress incontinence and uterine      prolapse and pelvic pressure.  2.  Schizophrenia.     ______________________________  Andrew Au, MD     PR/MEDQ  D:  08/15/2005  T:  08/15/2005  Job:  [id number]

## 2010-06-02 NOTE — Op Note (Signed)
[facility]  Patient:    Abigail Wallace, Abigail Wallace                      MRN: [id number] Proc. Date: 03/12/00 Adm. Date:  [id number] Attending:  Silvestre Gunner                           Operative Report  PREOPERATIVE DIAGNOSIS:  Urgency incontinence with urgency and frequency, rule out interstitial cystitis.  POSTOPERATIVE DIAGNOSIS:  Urgency incontinence with urgency and frequency, rule out interstitial cystitis.  OPERATION:  Cystoscopy, urethral calibration, hydrodistention of bladder, Marcaine and Pyridium instillation.  Marcaine and Kenalog injection.  SURGEON:  Domingo Pulse, M.D.  ANESTHESIA:  General.  COMPLICATIONS:  None.  DRAINS:  None.  BRIEF HISTORY:  This 65 year old female has had problems with severe urgency and urgency incontinence.  She is now to undergo diagnostic cystoscopy with hydrodistention.  She understands the risks and benefits of the procedure and gave full and informed consent.  DESCRIPTION OF PROCEDURE:  After the successful induction of general anesthesia, the patient was placed in the dorsal lithotomy position and prepped with Betadine and draped in the usual sterile fashion.  Careful bimanual examination revealed no abnormalities of the urethra.  Specifically, no evidence of diverticulum.  There was no specific cystocele, rectocele, or enterocele.  There were no masses on bimanual exam.  The urethra was of normal caliber.  I inserted a 21 [place] female sound with no evidence of stenosis or stricture.  The cystoscope was inserted.  The bladder was carefully inspected. It was free of any tumor or stones.  Both ureters were normal in configuration and location.  There were no signs of any squamous metaplasia.  The urethra was then distended to a pressure of 100 cm of water.  The patient had a bladder capacity that was quite normal at 1200 cc, but some glomerulation was seen when the bladder was drained.  This certainly  was not completely diagnostic of interstitial cystitis but suggested that the patient may  have some degree of inflammatory change within the bladder causing her urinary urgency and frequency.  Empirically, the patient will most likely be treated as if she has urgency incontinence to try and control her symptoms.  The bladder was drained.  A mixture of Marcaine and Pyridium was left in the bladder.  Marcaine and Kenalog were injected periurethrally.  The patient was given Toradol as well as Zofran and also received a B&O suppository.  She will be sent home with Lorcet Plus, Pyridium Plus, and Septra DS and will return to my office in three weeks time. DD:  03/12/00 TD:  03/13/00 Job: [id number]

## 2010-06-02 NOTE — Op Note (Signed)
NAME:  Abigail Wallace, Abigail Wallace                         ACCOUNT NO.:  [id number]   MEDICAL RECORD NO.:  [id number]                   PATIENT TYPE:  OIB   LOCATION:  2550                                 FACILITY:  [facility]   PHYSICIAN:  Earnstine Regal, M.D.                DATE OF BIRTH:  03-10-1945   DATE OF PROCEDURE:  10/27/2001  DATE OF DISCHARGE:                                 OPERATIVE REPORT   PREOPERATIVE DIAGNOSES:  Cholelithiasis, cystic duct obstruction.   POSTOPERATIVE DIAGNOSES:  Cholelithiasis, cystic duct obstruction.   PROCEDURE:  Laparoscopic cholecystectomy with intraoperative  cholangiography.   SURGEON:  Earnstine Regal, M.D.   ASSISTANT:  Sammuel Hines. Shiela Mayer.,  M.D.   ESTIMATED BLOOD LOSS:  Minimal.   PREPARATION:  Betadine.   COMPLICATIONS:  None.   INDICATIONS:  The patient is a 65 year old white female who presents from  the [facility] with intermittent abdominal pain.  The  patient has had a recent increase in the level of pain over the past two  months.  She underwent workup including abdominal ultrasound.  This  demonstrated cholelithiasis.  The patient underwent hepatobiliary scan in  nuclear medicine which demonstrated cystic duct obstruction.  The patient  now comes to surgery for cholecystectomy and operative cholangiography.   BODY OF REPORT:  Procedure done in OR #17 at the [facility].  The patient was brought to the operating room, placed in a supine  position on the operating room table.  Following administration of general  anesthesia, the patient was prepped and draped in the usual strict aseptic  fashion.  After adequate level of anesthesia had been obtained, a midline  incision was made just below the umbilicus.  Dissection was carried down to  the fascia.  Fascia was incised in the midline, and the peritoneal cavity  was entered cautiously.  A 0 Vicryl pursestring suture was placed in the  fascia.  A Hasson  cannula was introduced under direct vision and secured  with the pursestring suture.  The abdomen was insufflated with carbon  dioxide.  Laparoscope was introduced under direct vision, and the abdomen  was explored.  Operative ports were placed along the right costal margin in  the midline, midclavicular line, and anterior axillary line.  Fundus of the  gallbladder was grasped and retracted cephalad.  Dissection was begun at the  neck of the gallbladder.  Peritoneum is incised.  Cystic duct is dissected  out along its length.  A clip was placed at the neck of the gallbladder.  Cystic duct is incised.  Clear bile emanates from the cystic duct.  A Cook  cholangiography catheter was introduced through a stab wound in the right  upper quadrant.  It is inserted into the cystic duct and secured with a  Ligaclip.  Using C-arm fluoroscopy, real-time cholangiography is performed.  There was rapid filling  of the distal common bile duct.  There was free flow  of contrast into the duodenum.  There is no obstruction.  There are no  significant filling defects.  There was reflux of contrast into the proximal  biliary radicles.  On delayed views, there appears to be a small filling  defect in the mid-to-distal common bile duct.  However, this has the  appearance of an air bubble and moves within the duct like an air bubble.  It is reviewed by Dr. Remer Macho from radiology who agrees this likely  represents an air bubble and not a cholelith within the common bile duct.  Next, the clip was withdrawn.  Cook cholangiography catheter was withdrawn  from the peritoneum.  Cystic duct is triply clipped and divided.  Cystic  artery is dissected out along its length, triple clipped, and divided.  Gallbladder is then excised from the gallbladder bed using a hook  electrocautery for hemostasis.  Gallbladder was completed excised and  extracted through the umbilical port.  It contains numerous gallstones.  The  0  Vicryl pursestring suture is tied securely.  Right upper quadrant is  irrigated with warm saline which is evacuated.  Good hemostasis is noted.  Ports were removed under direct vision and pneumoperitoneum released.  Port  sites are anesthetized with local anesthetic.  All wounds were closed with  interrupted 4-0 Vicryl subcuticular sutures.  Wounds are washed and dried,  and Benzoin and Steri-Strips are applied.  Sterile gauze dressings are  applied.  The patient is awakened from anesthesia and brought to the  recovery room in stable condition.  The patient tolerated the procedure  well.                                                Earnstine Regal, M.D.    TMG/MEDQ  D:  10/27/2001  T:  10/27/2001  Job:  [id number]   cc:   Elvera Bicker, M.D.   Tonia Brooms, M.D.  [facility] Resident  [place], [place] [address]  Fax: [phone]

## 2010-06-02 NOTE — Cardiovascular Report (Signed)
NAME:  Abigail Wallace, Abigail Wallace                       ACCOUNT NO.:  [id number]   MEDICAL RECORD NO.:  [id number]                   PATIENT TYPE:  OIB   LOCATION:  5705                                 FACILITY:  [facility]   PHYSICIAN:  Ulice Dash R. Einar Gip, M.D.                  DATE OF BIRTH:  12/04/1945   DATE OF PROCEDURE:  08/06/2003  DATE OF DISCHARGE:                              CARDIAC CATHETERIZATION   PROCEDURE PERFORMED:  1. Left ventriculography.  2. Selective right and left coronary arteriography.  3. Abdominal aortogram.  4. Right femoral angiography.   INDICATION:  [name] is a 65 year old Caucasian female with history of  hypertension, smoking who has been complaining of chest pain suggestive of  angina.  She had a Cardiolite stress test which had revealed anterior wall  ischemia.  Given this, she was brought to the cardiac catheterization lab  for definitive diagnosis of coronary disease.   Abdominal aortogram was performed to evaluate for abdominal atherosclerosis  and renal artery stenosis given her hypertension and right femoral  angiography was performed because of difficulty during access.   HEMODYNAMIC DATA:  1. The left ventricular pressures were 0000000 with end-diastolic pressure of     12 mmHg.  2. Aortic pressure 119/71 with a mean of 93 mmHg.  3. There was no pressure gradient across the aortic valve.   ANGIOGRAPHIC DATA:  Left ventricle:  Left ventricular systolic function was  normal and ejection fraction was estimated at 60%.  There is no significant  mitral regurgitation.   Right coronary artery:  The right coronary artery is a very large caliber  vessel and a dominant vessel.  There is a minimal luminal irregularity in  the mid RCA.  Otherwise, the RCA is normal.   Left main coronary artery:  Left main coronary artery is a large caliber  vessel.  It has mild calcification, but otherwise no luminal obstruction.   Left anterior descending artery:  Left  anterior descending artery is a large  caliber vessel.  In the mid segment it has minimal luminal irregularity and  large diagonal one comes off in the mid segment.  Otherwise, the LAD is  smooth and normal.   Left circumflex:  Left circumflex is a large caliber vessel.  It gives  origin to a moderate size obtuse marginal one.  It is smooth and normal.   ABDOMINAL AORTOGRAM:  Abdominal aortogram revealed presence of two renal  arteries; one on either side.  They were widely patent.  There was no  evidence of abdominal aortic aneurysm.   RIGHT FEMORAL ANGIOGRAPHY:  Right femoral angiography revealed good arterial  access.  The right femoral artery and external iliac artery appeared widely  patent with minimal calcification, but no luminal obstruction.   IMPRESSION:  1. Normal left ventricular systolic function with ejection fraction of 60%.  2. Accept for minimal luminal irregularity of the right  coronary artery and     proximal left anterior descending which constitute maybe 5% stenosis, the     coronary arteries are essentially normal except for mild calcification.  3. No renal artery stenosis.  No abdominal aortic aneurysm.   RECOMMENDATIONS:  Based on the coronary anatomy, evaluation for noncardiac  cause of chest pain is indicated.  Her Imdur will be stopped and the patient  will be started on Prevacid.  If Prevacid does improve her chest pain,  continued medical therapy is indicated.  If not, she may need further GI  workup.   She had been complaining of lower extremity pain.  I am not sure if this is  related to peripheral vascular disease. However, obtain duplex of her lower  extremities in the form of segmental pressures an ABI to evaluate presence  of peripheral vascular disease.   Smoking cessation is again stressed to the patient.   TECHNIQUE OF PROCEDURE:  Under usual sterile precautions, using a 6 French  right femoral arterial access, a 6 [place] multipurpose pigtail  catheter was  advanced into the ascending aorta over a 0.035-inch J wire.  The catheter  was then gently advanced to the left ventricle and left ventricular  pressures were monitored.  Hand contrast injection of the left ventricle was  performed both in the LAO and RAO projection.  The catheter was flushed with  saline and pulled back into the ascending aorta and pressure gradient across  the aortic valve was monitored.  The right coronary artery was selectively  engaged and angiography was performed.  In a similar fashion, left main  coronary artery was selectively engaged and angiography was performed.  Then, the catheter was pulled back in the abdominal aorta and abdominal  aortogram was performed.  Then, the catheter was pulled out of the body and  a right ventricular angiography was performed through the arterial access  sheath.  The patient tolerated the procedure well.  No immediate  complications noted.                                               Eden Lathe. Einar Gip, M.D.    Minna Antis  D:  08/06/2003  T:  08/06/2003  Job:  [id number]   cc:   Beatrice Lecher, M.D.  [address] [employer], [place] [address]  Fax: [phone]

## 2010-06-09 ENCOUNTER — Other Ambulatory Visit: Payer: Self-pay | Admitting: Internal Medicine

## 2010-06-09 NOTE — Telephone Encounter (Signed)
Pt unknown to Dr. Alain Marion

## 2010-06-28 ENCOUNTER — Telehealth: Payer: Self-pay | Admitting: Family Medicine

## 2010-06-28 NOTE — Telephone Encounter (Signed)
Would like a nurse to call him back about his moms medications.  He is moving from [facility] to take care of his mom and he has several questions.

## 2010-06-29 NOTE — Telephone Encounter (Signed)
Son has been taking care of Ms. [facility] and would like to know if she can have a refill of 600mg  Ibuprofen sent to her pharmacy

## 2010-07-02 ENCOUNTER — Other Ambulatory Visit: Payer: Self-pay | Admitting: Family Medicine

## 2010-07-02 DIAGNOSIS — M169 Osteoarthritis of hip, unspecified: Secondary | ICD-10-CM

## 2010-07-02 DIAGNOSIS — M1610 Unilateral primary osteoarthritis, unspecified hip: Secondary | ICD-10-CM

## 2010-07-02 MED ORDER — IBUPROFEN 200 MG PO TABS: 200.0000 mg | ORAL_TABLET | Freq: Four times a day (QID) | ORAL | Status: DC | PRN

## 2010-07-02 NOTE — Assessment & Plan Note (Signed)
Will re-fill prn Motrin for this. Last Cr 04/2010 WNL.

## 2010-07-02 NOTE — Telephone Encounter (Signed)
Rx sent to pharmacy. I am just recommending 200 mg every 6 hours as needed for now. I do not want her to have stomach problems. May increase to 400 mg every 6 hours as needed if the 200 mg doesn't help.

## 2010-08-16 ENCOUNTER — Other Ambulatory Visit: Payer: Self-pay | Admitting: Obstetrics and Gynecology

## 2010-08-17 ENCOUNTER — Encounter (HOSPITAL_COMMUNITY): Payer: Self-pay

## 2010-08-17 ENCOUNTER — Encounter (HOSPITAL_COMMUNITY)
Admission: RE | Admit: 2010-08-17 | Discharge: 2010-08-17 | Disposition: A | Discharge status: Home / Self Care | Payer: Medicare Other | Source: Ambulatory Visit | Attending: Obstetrics and Gynecology | Admitting: Obstetrics and Gynecology

## 2010-08-17 HISTORY — DX: Atherosclerotic heart disease of native coronary artery without angina pectoris: I25.10

## 2010-08-17 HISTORY — DX: Mental disorder, not otherwise specified: F99

## 2010-08-17 HISTORY — DX: Gastro-esophageal reflux disease without esophagitis: K21.9

## 2010-08-17 HISTORY — DX: Chronic obstructive pulmonary disease, unspecified: J44.9

## 2010-08-17 LAB — BASIC METABOLIC PANEL
BUN: 9 mg/dL (ref 6–23)
Chloride: 92 mEq/L — ABNORMAL LOW (ref 96–112)
Creatinine, Ser: 0.65 mg/dL (ref 0.50–1.10)
GFR calc Af Amer: >60 mL/min (ref >60)
GFR calc non Af Amer: >60 mL/min (ref >60)

## 2010-08-17 LAB — CBC
Hemoglobin: 12.2 g/dL (ref 12.0–15.0)
MCH: 30 pg (ref 26.0–34.0)
RBC: 4.07 MIL/uL (ref 3.87–5.11)

## 2010-08-17 NOTE — Anesthesia Preprocedure Evaluation (Signed)
Anesthesia Evaluation  Name, MR# and DOB Patient awake  General Assessment Comment  Reviewed: Allergy & Precautions, H&P  and Patient's Chart, lab work & pertinent test results  Airway Mallampati: II TM Distance: >3 FB Neck ROM: Full    Dental  (+) Missing and Poor Dentition   Pulmonaryneg pulmonary ROS    clear to auscultation+ decreased breath sounds  pulmonary exam normal   Cardiovascular Regular Normal   Neuro/Psych (+) {AN ROS/MED HX NEURO HEADACHES (+) Schizophrenia Negative Neurological ROS   GI/Hepatic/Renal negative GI ROS, negative Liver ROS, and negative Renal ROS (+)       Endo/Other  Negative Endocrine ROS (+)   Abdominal   Musculoskeletal negative musculoskeletal ROS (+)  Hematology negative hematology ROS (+)   Peds  Reproductive/Obstetrics negative OB ROS   Anesthesia Other Findings             Anesthesia Physical Anesthesia Plan  ASA: III  Anesthesia Plan: General   Post-op Pain Management:    Induction: Intravenous  Airway Management Planned: LMA  Additional Equipment:   Intra-op Plan:   Post-operative Plan:   Informed Consent: I have reviewed the patients History and Physical, chart, labs and discussed the procedure including the risks, benefits and alternatives for the proposed anesthesia with the patient or authorized representative who has indicated his/her understanding and acceptance.   Dental advisory given  Plan Discussed with: Anesthesiologist (AP)  Anesthesia Plan Comments:         Anesthesia Quick Evaluation

## 2010-08-17 NOTE — Patient Instructions (Addendum)
Bleckley Holloway  08/17/2010   Your procedure is scheduled on:  08/23/10  Report to [facility] at 830 AM.  Call this number if you have problems the morning of surgery: [phone]   Remember:   Do not eat food:After Midnight.  Do not drink clear liquids: After Midnight.  Take these medicines the morning of surgery with A SIP OF WATER: per anes   Do not wear jewelry, make-up or nail polish.  Do not wear lotions, powders, or perfumes. You may wear deodorant.  Do not shave 48 hours prior to surgery.  Do not bring valuables to the hospital.  Contacts, dentures or bridgework may not be worn into surgery.  Leave suitcase in the car. After surgery it may be brought to your room.  For patients admitted to the hospital, checkout time is 11:00 AM the day of discharge.   Patients discharged the day of surgery will not be allowed to drive home.  Name and phone number of your driver: Marnette Burgess  son  Special Instructions: CHG wash per written instruction sheet   Please read over the following fact sheets that you were given: NA

## 2010-08-18 ENCOUNTER — Telehealth: Payer: Self-pay | Admitting: Family Medicine

## 2010-08-18 NOTE — Telephone Encounter (Signed)
Please ask [name] to come in for an appointment for medical clearance for her dental procedure. Thank you.

## 2010-08-21 NOTE — H&P (Signed)
NAME:  [name], Onyx                  ACCOUNT NO.:  MEDICAL RECORD NO.:  [id number]  LOCATION:                                 FACILITY:  PHYSICIAN:  Maryella Shivers, M.D. DATE OF BIRTH:  Aug 03, 1945  DATE OF ADMISSION: DATE OF DISCHARGE:                             HISTORY & PHYSICAL   HISTORY OF PRESENT ILLNESS:  [name] is a 65 year old single white female, para 4-0-0-3 who presents for hysteroscopy, D and C because of an endometrial mass and pelvic pain.  The patient reports that for the past 6-8 weeks she has had pain in her lower abdomen, which is made worse when she lifts, coughs, or sneezes, but will respond well to pain medication.  A pelvic ultrasound showed an uterus measuring 5.84 x 4.41 x 3.22 cm and endometrial mass measuring 1.0 x 1.8 x 0.98 cm.  Both of the patient's ovaries appeared normal on that study.  The patient went on to say that she experiences urinary incontinence, but denies any changes in her bowel movements, fever, vaginitis symptoms, or pain with urination.  Given the patient's symptoms and findings on ultrasound, she has consented to proceed with hysteroscopy, D and C for management of her endometrial mass.  PAST MEDICAL HISTORY:  OB History:  Gravida 4, para 4-0-0-3. All births were vaginal, with one infant dying 3 days after delivery.  GYN History:  Menarche 65 years old.  The patient is menopausal.  Denies any history of abnormal Pap smears or sexually transmitted diseases. Her last normal Pap smear was November 2011.  Medical History:  Schizophrenia,cardiac pacemaker, and hypertension.  Surgical History:  1951 tonsillectomy, 1986 a pin placement in her right arm, (unknown year) cholecystectomy, 2007 placement of a pacemaker.  The patient denies any problems with anesthesia or history of blood transfusions.  FAMILY HISTORY:  Cardiovascular disease, diabetes mellitus, and hypertension.  SOCIAL HISTORY:  The patient is disabled due to  her schizophrenia.  HABITS:  She smokes 1 pack of cigarettes per day.  Denies alcohol use or illicit drug use.  CURRENT MEDICATIONS: 1. Tylenol 500 mg 2 every 6 hours as needed for pain. 2. Albuterol inhaler 2 puffs every 6 hours as needed. 3. Allopurinol 100 mg daily. 4. Calcium 400 mg daily. 5. Valium 5 mg at bedtime. 6. Depakote 500 mg at bedtime. 7. Lasix 20 mg daily. 8. Vicodin 5/500 one tablet twice daily as needed. 9. Ibuprofen 200 mg 1 tablet as needed every 6 hours. 10.Latuda 40 mg daily. 11.Toprol-XL 25 mg daily. 12.Lopressor 25 mg twice a day. 13.Miconazole nitrate cream 2% to the vulvovaginal area twice daily as     needed. 14.Relafen 500 mg twice daily. 15.Nitrostat SL 1 tablet under the tongue as needed for 5 minutes up     to 3 doses. 16.Zyprexa 15 mg at bedtime. 17.Prilosec 20 mg daily. 18.Klor-Con 10 mEq daily. 19.Crestor 5 mg at bedtime. 20.Spiriva 18 mcg 2 puffs daily. 21.Desyrel 100 mg at bedtime. 22.Ambien CR 12.5 mg at bedtime as needed.  ALLERGIES:  The patient is allergic to COGENTIN, HALDOL, CLOZARIL, and ABILIFY.  REVIEW OF SYSTEMS:  The patient reports galactorrhea, urinary urgency,  frequency and incontinence. Denies any chest pain, shortness of breath headache, vision changes, myalgias, arthralgias, nausea, vomiting, diarrhea, dysuria, hematuria, flank pain and except as is mentioned in the history of present illness the patient's review of systems  Is otherwise negative.  PHYSICAL EXAMINATION:  VITAL SIGNS:  Blood pressure is 110/64, pulse is 72, respiration is 16, and temperature is 98 degrees Fahrenheit orally. Her weight is 169 pounds.  Her height is 5 feet and 6 inches tall.  Body mass index is 27.3. NECK:  Supple without masses.  There is no thyromegaly or cervical adenopathy. HEART:  Regular rate and rhythm. LUNGS:  Clear. BACK:  No CVA tenderness. ABDOMEN:  There is diffuse tenderness throughout with voluntary guarding, but no  rebound, no masses or organomegaly. EXTREMITIES:  No clubbing, cyanosis, or edema. PELVIC:  EGBUS is normal.  Vagina is normal.  Cervix is nontender without lesions.  Uterus appears normal size, shape, and consistency, however, there is tenderness at the fundus.  Adnexa without tenderness or masses.  The patient's urinalysis revealed a specific gravity of 1.005, her pH was 5, 2+ blood, positive for nitrates, otherwise negative.  IMPRESSION: 1. Endometrial mass. 2. Urinary tract infection.  DISPOSITION:  A discussion was held with the patient regarding indications for her procedure along with its risks which include but are not limited to reaction to anesthesia, damage to adjacent organs, infection, and excessive bleeding.  The patient was also treated with Cipro 250 mg twice daily for 7 days and advised to increase her fluid intake.  The patient verbalized understanding of these risks along with instructions for managing her urinary tract infection.  She is therefore consented to proceed with hysteroscopy, D and C at [facility] on August 23, 2010, at 10 o'clock a.m.     Jenisha Faison J. Florene Glen, P.A.-C ____________________________ Harvie Bridge. Mancel Bale, M.D.     EJP/MEDQ  D:  08/21/2010  T:  08/21/2010  Job:  [id number]  08/22/08 Patient with a h/o schizophrenia reports that she has no changes in her physical condition however she thinks she is pregnant.  Prior to the case, I discussed with the patient doing a pregnancy test and if it is negative she is ok with proceeding.  Her sodium level today is 124 and I discussed with Abigail Wallace only using normal saline to distend the cavity and he will do a spinal and possibly admit for observation overnight.  Will repeat the BMET in the recovery room.

## 2010-08-23 ENCOUNTER — Other Ambulatory Visit: Payer: Self-pay | Admitting: Obstetrics and Gynecology

## 2010-08-23 ENCOUNTER — Encounter (HOSPITAL_COMMUNITY): Payer: Self-pay | Admitting: Anesthesiology

## 2010-08-23 ENCOUNTER — Encounter (HOSPITAL_COMMUNITY)
Admission: RE | Disposition: A | Discharge status: Home / Self Care | Payer: Self-pay | Source: Ambulatory Visit | Attending: Obstetrics and Gynecology

## 2010-08-23 ENCOUNTER — Ambulatory Visit (HOSPITAL_COMMUNITY)
Admission: RE | Admit: 2010-08-23 | Discharge: 2010-08-23 | Disposition: A | Discharge status: Home / Self Care | Payer: Medicare Other | Source: Ambulatory Visit | Attending: Obstetrics and Gynecology | Admitting: Obstetrics and Gynecology

## 2010-08-23 ENCOUNTER — Ambulatory Visit (HOSPITAL_COMMUNITY): Payer: Medicare Other | Admitting: Anesthesiology

## 2010-08-23 ENCOUNTER — Encounter (HOSPITAL_COMMUNITY): Payer: Self-pay

## 2010-08-23 DIAGNOSIS — N390 Urinary tract infection, site not specified: Secondary | ICD-10-CM | POA: Insufficient documentation

## 2010-08-23 DIAGNOSIS — Z01812 Encounter for preprocedural laboratory examination: Secondary | ICD-10-CM | POA: Insufficient documentation

## 2010-08-23 DIAGNOSIS — N840 Polyp of corpus uteri: Secondary | ICD-10-CM | POA: Insufficient documentation

## 2010-08-23 DIAGNOSIS — N949 Unspecified condition associated with female genital organs and menstrual cycle: Secondary | ICD-10-CM | POA: Insufficient documentation

## 2010-08-23 DIAGNOSIS — Z01818 Encounter for other preprocedural examination: Secondary | ICD-10-CM | POA: Insufficient documentation

## 2010-08-23 DIAGNOSIS — Z950 Presence of cardiac pacemaker: Secondary | ICD-10-CM | POA: Insufficient documentation

## 2010-08-23 DIAGNOSIS — F209 Schizophrenia, unspecified: Secondary | ICD-10-CM | POA: Insufficient documentation

## 2010-08-23 DIAGNOSIS — N393 Stress incontinence (female) (male): Secondary | ICD-10-CM | POA: Insufficient documentation

## 2010-08-23 DIAGNOSIS — I10 Essential (primary) hypertension: Secondary | ICD-10-CM | POA: Insufficient documentation

## 2010-08-23 DIAGNOSIS — N643 Galactorrhea not associated with childbirth: Secondary | ICD-10-CM | POA: Insufficient documentation

## 2010-08-23 LAB — BASIC METABOLIC PANEL
CO2: 27 mEq/L (ref 19–32)
CO2: 28 mEq/L (ref 19–32)
Calcium: 10.2 mg/dL (ref 8.4–10.5)
Chloride: 88 mEq/L — ABNORMAL LOW (ref 96–112)
Chloride: 91 mEq/L — ABNORMAL LOW (ref 96–112)
Creatinine, Ser: 0.65 mg/dL (ref 0.50–1.10)
Creatinine, Ser: 0.67 mg/dL (ref 0.50–1.10)
Glucose, Bld: 85 mg/dL (ref 70–99)
Glucose, Bld: 80 mg/dL (ref 70–99)

## 2010-08-23 LAB — HCG, SERUM, QUALITATIVE: Preg, Serum: NEGATIVE

## 2010-08-23 SURGERY — DILATATION & CURETTAGE/HYSTEROSCOPY WITH RESECTOCOPE
Anesthesia: Spinal | Wound class: Clean Contaminated

## 2010-08-23 MED ORDER — LACTATED RINGERS IV SOLN
INTRAVENOUS | Status: DC
  Administered 2010-08-23: 10:00:00 via INTRAVENOUS

## 2010-08-23 MED ORDER — FENTANYL CITRATE 0.05 MG/ML IJ SOLN
INTRAMUSCULAR | Status: AC
  Filled 2010-08-23: qty 2

## 2010-08-23 MED ORDER — MIDAZOLAM HCL 2 MG/2ML IJ SOLN
INTRAMUSCULAR | Status: AC
  Filled 2010-08-23: qty 2

## 2010-08-23 MED ORDER — SODIUM CHLORIDE 0.9 % IV SOLN
INTRAVENOUS | Status: DC | PRN
  Administered 2010-08-23: 11:00:00 via INTRAVENOUS

## 2010-08-23 MED ORDER — FENTANYL CITRATE 0.05 MG/ML IJ SOLN
INTRAMUSCULAR | Status: AC
  Administered 2010-08-23: 25 ug via INTRAVENOUS
  Filled 2010-08-23: qty 2

## 2010-08-23 MED ORDER — MEPERIDINE HCL 25 MG/ML IJ SOLN: 6.2500 mg | INTRAMUSCULAR | Status: DC | PRN

## 2010-08-23 MED ORDER — LIDOCAINE HCL 1 % IJ SOLN
INTRAMUSCULAR | Status: DC | PRN
  Administered 2010-08-23: 10 mL via INTRADERMAL

## 2010-08-23 MED ORDER — FENTANYL CITRATE 0.05 MG/ML IJ SOLN
25.0000 ug | INTRAMUSCULAR | Status: DC | PRN
  Administered 2010-08-23 (×2): 25 ug via INTRAVENOUS

## 2010-08-23 MED ORDER — SODIUM CHLORIDE 0.9 % IR SOLN
Status: DC | PRN
  Administered 2010-08-23: 3000 mL

## 2010-08-23 SURGICAL SUPPLY — 18 items
CANISTER SUCTION 2500CC (MISCELLANEOUS) ×2 IMPLANT
CATH ROBINSON RED A/P 16FR (CATHETERS) ×2 IMPLANT
CLOTH BEACON ORANGE TIMEOUT ST (SAFETY) ×2 IMPLANT
CONTAINER PREFILL 10% NBF 60ML (FORM) ×4 IMPLANT
DRAPE UTILITY XL STRL (DRAPES) ×4 IMPLANT
ELECT REM PT RETURN 9FT ADLT (ELECTROSURGICAL) ×2
ELECTRODE REM PT RTRN 9FT ADLT (ELECTROSURGICAL) ×1 IMPLANT
ELECTRODE ROLLER BARREL 22FR (ELECTROSURGICAL) IMPLANT
ELECTRODE VAPORCUT 22FR (ELECTROSURGICAL) IMPLANT
GLOVE BIO SURGEON STRL SZ7.5 (GLOVE) ×4 IMPLANT
GLOVE BIOGEL PI IND STRL 7.5 (GLOVE) ×1 IMPLANT
GLOVE BIOGEL PI INDICATOR 7.5 (GLOVE) ×1
GOWN PREVENTION PLUS LG XLONG (DISPOSABLE) ×2 IMPLANT
GOWN STRL REIN XL XLG (GOWN DISPOSABLE) ×2 IMPLANT
LOOP ANGLED CUTTING 22FR (CUTTING LOOP) IMPLANT
PACK HYSTEROSCOPY LF (CUSTOM PROCEDURE TRAY) ×2 IMPLANT
TOWEL OR 17X24 6PK STRL BLUE (TOWEL DISPOSABLE) ×4 IMPLANT
WATER STERILE IRR 1000ML POUR (IV SOLUTION) ×2 IMPLANT

## 2010-08-23 NOTE — Anesthesia Postprocedure Evaluation (Signed)
  Anesthesia Post-op Note  Patient: Abigail Wallace  Procedure(s) Performed:  DILATATION & CURETTAGE/HYSTEROSCOPY WITH RESECTOSCOPE - with no resection  Patient is awake and responsive,numbness has resolved,  her level of cognition is same as pre-op and her repeat Na+ is improved. Pain and nausea are reasonably well controlled. Vital signs are stable and clinically acceptable. Oxygen saturation is clinically acceptable. There are no apparent anesthetic complications at this time. Patient is ready for discharge, Follow up for hyponatremia is being arranged per Dr Mancel Bale.

## 2010-08-23 NOTE — Op Note (Signed)
Preop Diagnosis: 1.Endometrial Mass 2.Pelvic Pain 3.Schizophrenia   Postop Diagnosis: 1.Endometrial mass 2.Pelvic Pain 3.Schizophrenia  Procedure: DILATATION & CURETTAGE/HYSTEROSCOPY WITH RESECTOSCOPE   Anesthesia: Spinal   Anesthesiologist: No responsible anesthesiologist has been recorded for the case.   Attending: Delice Lesch, MD   Assistant: N/A  Findings: Large approximately 1.5 cm polyp.  Uterus sounded to 8 cm.  Pathology: endometrial polyp and currettings  Fluids: 300cc  UOP: approx. 1000 cc via straight cath prior to procedure  EBL: minimal  Complications: none  Procedure: The patient was taken to the operating room after the risks, benefits and alternatives discussed with patient. The patient verbalized understanding and consent signed and witnessed. The patient was given a spinal per anesthesia and prepped and draped in the normal sterile fashion and a Time Out performed per protocol.  A bivalve speculum was placed the patient's vagina and the anterior lip of the cervix was grasped with a single tooth tenaculum. A paracervical block was administered using a total of 10 cc of 1% lidocaine. The uterus sounded to 8 cm. The cervix was dilated for passage of the hysteroscope.  The hysteroscope was introduced into the uterine cavity and findings as noted above. Polyp forceps were then used and the polyp was grasped and removed without difficulty. Sharp curettage was performed until a gritty texture was noted and currettings sent to pathology. The hysteroscope was reintroduced and no obvious remaining intracavitary lesions were noted.  All instruments were removed. Sponge lap and needle count was correct. The patient tolerated the procedure well and was returned to the recovery room in good condition.

## 2010-08-23 NOTE — Transfer of Care (Signed)
Immediate Anesthesia Transfer of Care Note  Patient: Abigail Wallace  Procedure(s) Performed:  DILATATION & CURETTAGE/HYSTEROSCOPY WITH RESECTOSCOPE - with no resection  Patient Location: PACU  Anesthesia Type: Spinal  Level of Consciousness: awake, alert , patient cooperative and responds to stimulation  Airway & Oxygen Therapy: Patient Spontanous Breathing  Post-op Assessment: Report given to PACU RN and Post -op Vital signs reviewed and stable  Post vital signs: Reviewed and stable  Complications: No apparent anesthesia complications

## 2010-08-23 NOTE — Anesthesia Procedure Notes (Addendum)
Spinal Block  Patient location during procedure: OR Staffing Anesthesiologist: Riccardo Dubin Preanesthetic Checklist Completed: patient identified, site marked, surgical consent, pre-op evaluation, timeout performed, IV checked, risks and benefits discussed and monitors and equipment checked Spinal Block Patient position: sitting Prep: DuraPrep Patient monitoring: heart rate, cardiac monitor, continuous pulse ox and blood pressure Approach: midline Location: L3-4 Injection technique: single-shot Needle Needle type: Sprotte  Needle gauge: 24 G Needle length: 9 cm Assessment Sensory level: T4 Additional Notes Spinal Dosage in OR  Bupivicaine ml       1.2

## 2010-08-29 ENCOUNTER — Other Ambulatory Visit: Payer: Self-pay | Admitting: Family Medicine

## 2010-08-29 NOTE — Telephone Encounter (Signed)
Refill request

## 2010-09-05 ENCOUNTER — Ambulatory Visit (INDEPENDENT_AMBULATORY_CARE_PROVIDER_SITE_OTHER): Payer: Medicare Other | Admitting: Family Medicine

## 2010-09-05 ENCOUNTER — Encounter: Payer: Self-pay | Admitting: Family Medicine

## 2010-09-05 VITALS — BP 145/72 | HR 84 | Temp 97.5°F

## 2010-09-05 DIAGNOSIS — Z01818 Encounter for other preprocedural examination: Secondary | ICD-10-CM

## 2010-09-05 MED ORDER — CIPROFLOXACIN HCL 500 MG PO TABS: 500.0000 mg | ORAL_TABLET | Freq: Two times a day (BID) | ORAL | Status: AC

## 2010-09-05 NOTE — Patient Instructions (Signed)
Very nice to meet you From a medical standpoint I would clear you for surgery pending the clearance by your cardiologist Please have your dentist talk to your heart doctor to get records if they say you are clear then you can have surgery and get you teeth fix.  I will send a note to your dentist as well.

## 2010-09-05 NOTE — Progress Notes (Signed)
Subjective:    Abigail Wallace is a 65 y.o. female who presents to the office today for a preoperative consultation at the request of surgeon Hoyt Koch who plans on performing dental procedure on unknown . This consultation is requested for the specific conditions prompting preoperative evaluation (i.e. because of potential affect on operative risk). Planned anesthesia: general. The patient has the following known anesthesia issues: none as noted. Patients bleeding risk: no recent abnormal bleeding. Patient does not have objections to receiving blood products if needed.  Pt does have schizophrenia but is component to make own healthcare decision.   The following portions of the patient's history were reviewed and updated as appropriate: allergies, current medications, past family history, past medical history, past social history, past surgical history and problem list.  Review of Systems Pertinent items are noted in HPI.    Objective:    BP 161/78  Pulse 84  Temp(Src) 97.5 F (36.4 C) (Oral) General appearance: alert and no distress Eyes: negative Throat: lips, mucosa, and tongue normal; teeth and gums normal Neck: no adenopathy, no carotid bruit, supple, symmetrical, trachea midline and thyroid not enlarged, symmetric, no tenderness/mass/nodules Heart: S1, S2 normal and systolic murmur: early systolic 1/6, rumbling at apex Abdomen: soft, non-tender; bowel sounds normal; no masses,  no organomegaly Extremities: extremities normal, atraumatic, no cyanosis or edema Pulses: 2+ and symmetric Neurologic: Grossly normal  Predictors of intubation difficulty:  Morbid obesity? No obese though  Anatomically abnormal facies? no  Prominent incisors? yes - but will be removed.   Receding mandible? no  Short, thick neck? no  Neck range of motion: normal  Mallampati score: III (soft and hard palate and base of uvula visible)  Thyromental distance: < 6cm  Mouth opening: 5cm  Dentition: Loose  teeth present (many) and Missing teeth (many)  Cardiographics Pt though ha sone by cardiologist and would [facility] 07/2003 Cardiac cath: normal  2005 Cardiac cath: complete heart block  2007 Pacer  05/2008 Persantine myoview: normal  11/2009: LV dysfx 2/2 HB without HF  04/13/2010 Will get persantine myoview to evaluate vague substernal chest pain complaint and start Protonix empirically; pacemaker good. With LBBB with pacemaker but no over heart failure or elevated BNP.  2007 Pacemaker  05/2008 Negative Cardiolite  10/2009 ECHO: EF [id number], diastolic relaxation abnormal, pacer-induced left bundle c septal motion abnormality; mild intraventricular dyssynchrony     Lab Review Lab Results  Component Value Date   WBC 10.0 08/17/2010   HGB 12.2 08/17/2010   HCT 35.1* 08/17/2010   MCV 86.2 08/17/2010   PLT 246 08/17/2010     Lab Results  Component Value Date   NA 128* 08/23/2010   K 3.8 08/23/2010   CL 91* 08/23/2010   CO2 28 08/23/2010      Active Ambulatory Problems    Diagnosis Date Noted  . HYPERCHOLESTEROLEMIA 03/14/2006  . SCHIZOPHRENIA 03/14/2006  . CAD 10/18/2006  . SICK SINUS SYNDROME 04/24/2006  . Chronic diastolic heart failure [id number]  . COPD 03/14/2006  . Microscopic hematuria 12/15/2009  . OSTEOARTHRITIS, HIP, RIGHT 01/30/2008  . RESTING TREMOR 12/15/2009  . LEG EDEMA, BILATERAL 02/17/2010  . Overactive bladder 03/27/2010  . Vaginal prolapse 03/27/2010  . Sleep apnea 03/27/2010  . Abdominal pain 03/27/2010  . Osteoarthritis of right knee 03/27/2010  . Pacemaker 03/30/2010  . Vulvovaginitis 04/18/2010   Resolved Ambulatory Problems    Diagnosis Date Noted  . Acute cystitis 11/22/2009  . PAIN IN SOFT TISSUES OF LIMB 11/22/2009  .  Erroneous encounter - disregard 03/30/2010   Past Medical History  Diagnosis Date  . Cystitis   . Gout   . Coronary artery disease   . COPD (chronic obstructive pulmonary disease)   . GERD (gastroesophageal reflux disease)   . Mental  disorder       Assessment:      64 y.o. female with planned surgery as above.   Known risk factors for perioperative complications: Coronary disease Congestive heart failure   Difficulty with intubation is not anticipated.  Cardiac Risk Estimation: 20% would like cardiology to clear patient as well  Current medications which may produce withdrawal symptoms if withheld perioperatively:    Plan:    1. Preoperative workup as follows Pt needs evaluation by cardiologist first, if cleared then cleared medically. . 2. Change in medication regimen before surgery: none, continue medication regimen including morning of surgery, with sip of water. 3. Prophylaxis for cardiac events with perioperative beta-blockers: should be considered, specific regimen per anesthesia. 4. Invasive hemodynamic monitoring perioperatively: not indicated. 5. Deep vein thrombosis prophylaxis postoperatively:pharmacologic prophylaxis (with any of the following: per surgery). 6. Surveillance for postoperative MI with ECG immediately postoperatively and on postoperative days 1 and 2 AND troponin levels 24 hours postoperatively and on day 4 or hospital discharge (whichever comes first): not indicated. 7. Other measures: Preoperative pacemaker evaluation to confirm normal function (). Needs cardiology evaluation or clearance

## 2010-09-06 ENCOUNTER — Emergency Department (HOSPITAL_COMMUNITY): Payer: Medicare Other

## 2010-09-06 ENCOUNTER — Inpatient Hospital Stay (HOSPITAL_COMMUNITY)
Admission: EM | Admit: 2010-09-06 | Discharge: 2010-09-08 | DRG: 287 | Disposition: A | Discharge status: Home / Self Care | Payer: Medicare Other | Source: Ambulatory Visit | Attending: Cardiovascular Disease | Admitting: Cardiovascular Disease

## 2010-09-06 DIAGNOSIS — F17200 Nicotine dependence, unspecified, uncomplicated: Secondary | ICD-10-CM | POA: Diagnosis present

## 2010-09-06 DIAGNOSIS — Z950 Presence of cardiac pacemaker: Secondary | ICD-10-CM

## 2010-09-06 DIAGNOSIS — K219 Gastro-esophageal reflux disease without esophagitis: Secondary | ICD-10-CM | POA: Diagnosis present

## 2010-09-06 DIAGNOSIS — F209 Schizophrenia, unspecified: Secondary | ICD-10-CM | POA: Diagnosis present

## 2010-09-06 DIAGNOSIS — I4510 Unspecified right bundle-branch block: Secondary | ICD-10-CM | POA: Diagnosis present

## 2010-09-06 DIAGNOSIS — M1710 Unilateral primary osteoarthritis, unspecified knee: Secondary | ICD-10-CM | POA: Diagnosis present

## 2010-09-06 DIAGNOSIS — I5042 Chronic combined systolic (congestive) and diastolic (congestive) heart failure: Secondary | ICD-10-CM | POA: Diagnosis present

## 2010-09-06 DIAGNOSIS — E785 Hyperlipidemia, unspecified: Secondary | ICD-10-CM | POA: Diagnosis present

## 2010-09-06 DIAGNOSIS — G250 Essential tremor: Secondary | ICD-10-CM | POA: Diagnosis present

## 2010-09-06 DIAGNOSIS — R109 Unspecified abdominal pain: Secondary | ICD-10-CM | POA: Diagnosis present

## 2010-09-06 DIAGNOSIS — M109 Gout, unspecified: Secondary | ICD-10-CM | POA: Diagnosis present

## 2010-09-06 DIAGNOSIS — I10 Essential (primary) hypertension: Secondary | ICD-10-CM | POA: Diagnosis present

## 2010-09-06 DIAGNOSIS — M169 Osteoarthritis of hip, unspecified: Secondary | ICD-10-CM | POA: Diagnosis present

## 2010-09-06 DIAGNOSIS — I739 Peripheral vascular disease, unspecified: Secondary | ICD-10-CM | POA: Diagnosis present

## 2010-09-06 DIAGNOSIS — F29 Unspecified psychosis not due to a substance or known physiological condition: Secondary | ICD-10-CM | POA: Diagnosis present

## 2010-09-06 DIAGNOSIS — Z8249 Family history of ischemic heart disease and other diseases of the circulatory system: Secondary | ICD-10-CM

## 2010-09-06 DIAGNOSIS — F450 Somatization disorder: Secondary | ICD-10-CM | POA: Diagnosis present

## 2010-09-06 DIAGNOSIS — M1610 Unilateral primary osteoarthritis, unspecified hip: Secondary | ICD-10-CM | POA: Diagnosis present

## 2010-09-06 DIAGNOSIS — G4730 Sleep apnea, unspecified: Secondary | ICD-10-CM | POA: Diagnosis present

## 2010-09-06 DIAGNOSIS — J449 Chronic obstructive pulmonary disease, unspecified: Secondary | ICD-10-CM | POA: Diagnosis present

## 2010-09-06 DIAGNOSIS — I2510 Atherosclerotic heart disease of native coronary artery without angina pectoris: Secondary | ICD-10-CM | POA: Diagnosis present

## 2010-09-06 DIAGNOSIS — Z79899 Other long term (current) drug therapy: Secondary | ICD-10-CM

## 2010-09-06 DIAGNOSIS — Z7982 Long term (current) use of aspirin: Secondary | ICD-10-CM

## 2010-09-06 DIAGNOSIS — N318 Other neuromuscular dysfunction of bladder: Secondary | ICD-10-CM | POA: Diagnosis present

## 2010-09-06 DIAGNOSIS — I440 Atrioventricular block, first degree: Secondary | ICD-10-CM | POA: Diagnosis present

## 2010-09-06 DIAGNOSIS — F411 Generalized anxiety disorder: Secondary | ICD-10-CM | POA: Diagnosis present

## 2010-09-06 DIAGNOSIS — I509 Heart failure, unspecified: Secondary | ICD-10-CM | POA: Diagnosis present

## 2010-09-06 DIAGNOSIS — J4489 Other specified chronic obstructive pulmonary disease: Secondary | ICD-10-CM | POA: Diagnosis present

## 2010-09-06 DIAGNOSIS — Z833 Family history of diabetes mellitus: Secondary | ICD-10-CM

## 2010-09-06 DIAGNOSIS — R0789 Other chest pain: Principal | ICD-10-CM | POA: Diagnosis present

## 2010-09-06 LAB — COMPREHENSIVE METABOLIC PANEL
ALT: 7 U/L (ref 0–35)
Alkaline Phosphatase: 64 U/L (ref 39–117)
CO2: 24 mEq/L (ref 19–32)
Chloride: 96 mEq/L (ref 96–112)
GFR calc Af Amer: >60 mL/min (ref >60)
GFR calc non Af Amer: >60 mL/min (ref >60)
Glucose, Bld: 103 mg/dL — ABNORMAL HIGH (ref 70–99)
Potassium: 3.6 mEq/L (ref 3.5–5.1)
Sodium: 130 mEq/L — ABNORMAL LOW (ref 135–145)
Total Bilirubin: 0.2 mg/dL — ABNORMAL LOW (ref 0.3–1.2)

## 2010-09-06 LAB — POCT I-STAT, CHEM 8
BUN: 9 mg/dL (ref 6–23)
Calcium, Ion: 1.2 mmol/L (ref 1.12–1.32)
Chloride: 95 mEq/L — ABNORMAL LOW (ref 96–112)
Creatinine, Ser: 0.8 mg/dL (ref 0.50–1.10)
Glucose, Bld: 96 mg/dL (ref 70–99)

## 2010-09-06 LAB — CBC
MCH: 30.2 pg (ref 26.0–34.0)
MCV: 83.6 fL (ref 78.0–100.0)
Platelets: 265 10*3/uL (ref 150–400)
RBC: 4.27 MIL/uL (ref 3.87–5.11)

## 2010-09-06 LAB — DIFFERENTIAL
Eosinophils Absolute: 0.2 10*3/uL (ref 0.0–0.7)
Eosinophils Relative: 2 % (ref 0–5)
Lymphs Abs: 3 10*3/uL (ref 0.7–4.0)
Monocytes Absolute: 0.7 10*3/uL (ref 0.1–1.0)
Monocytes Relative: 7 % (ref 3–12)
Neutrophils Relative %: 63 % (ref 43–77)

## 2010-09-06 LAB — POCT I-STAT TROPONIN I

## 2010-09-07 DIAGNOSIS — R0789 Other chest pain: Secondary | ICD-10-CM

## 2010-09-07 DIAGNOSIS — F2089 Other schizophrenia: Secondary | ICD-10-CM

## 2010-09-07 LAB — GLUCOSE, CAPILLARY
Glucose-Capillary: 128 mg/dL — ABNORMAL HIGH (ref 70–99)
Glucose-Capillary: 93 mg/dL (ref 70–99)
Glucose-Capillary: 112 mg/dL — ABNORMAL HIGH (ref 70–99)

## 2010-09-07 LAB — COMPREHENSIVE METABOLIC PANEL
Albumin: 3.6 g/dL (ref 3.5–5.2)
Alkaline Phosphatase: 60 U/L (ref 39–117)
BUN: 6 mg/dL (ref 6–23)
Calcium: 9.3 mg/dL (ref 8.4–10.5)
Potassium: 3.7 mEq/L (ref 3.5–5.1)
Sodium: 135 mEq/L (ref 135–145)
Total Protein: 6.3 g/dL (ref 6.0–8.3)

## 2010-09-07 LAB — CBC
HCT: 35.7 % — ABNORMAL LOW (ref 36.0–46.0)
MCH: 29.8 pg (ref 26.0–34.0)
MCHC: 35 g/dL (ref 30.0–36.0)
RDW: 13.1 % (ref 11.5–15.5)

## 2010-09-07 LAB — URINE MICROSCOPIC-ADD ON

## 2010-09-07 LAB — TROPONIN I: Troponin I: <0.3 ng/mL (ref <0.30)

## 2010-09-07 LAB — HEPARIN LEVEL (UNFRACTIONATED): Heparin Unfractionated: 0.33 IU/mL (ref 0.30–0.70)

## 2010-09-07 LAB — URINALYSIS, ROUTINE W REFLEX MICROSCOPIC
Glucose, UA: NEGATIVE mg/dL
Leukocytes, UA: NEGATIVE
pH: 7 (ref 5.0–8.0)

## 2010-09-08 ENCOUNTER — Inpatient Hospital Stay (HOSPITAL_COMMUNITY): Payer: Medicare Other

## 2010-09-08 HISTORY — PX: TRANSTHORACIC ECHOCARDIOGRAM: SHX275

## 2010-09-08 LAB — BASIC METABOLIC PANEL
Calcium: 9.1 mg/dL (ref 8.4–10.5)
Creatinine, Ser: 0.56 mg/dL (ref 0.50–1.10)
GFR calc non Af Amer: >60 mL/min (ref >60)
Sodium: 128 mEq/L — ABNORMAL LOW (ref 135–145)

## 2010-09-08 LAB — CBC
MCH: 29.4 pg (ref 26.0–34.0)
MCHC: 34.4 g/dL (ref 30.0–36.0)
MCV: 85.3 fL (ref 78.0–100.0)
Platelets: 231 10*3/uL (ref 150–400)
RDW: 13.3 % (ref 11.5–15.5)

## 2010-09-08 LAB — URINE CULTURE
Colony Count: NO GROWTH
Culture  Setup Time: 201208230833
Culture: NO GROWTH
Special Requests: NEGATIVE

## 2010-09-08 LAB — URINALYSIS, DIPSTICK ONLY
Bilirubin Urine: NEGATIVE
Glucose, UA: NEGATIVE mg/dL
Ketones, ur: NEGATIVE mg/dL
Nitrite: NEGATIVE
pH: 7 (ref 5.0–8.0)

## 2010-09-10 NOTE — Cardiovascular Report (Signed)
NAME:  Abigail Wallace, Abigail Wallace             ACCOUNT NO.:  [id number]  MEDICAL RECORD NO.:  [id number]  LOCATION:  [id number]                         FACILITY:  [facility] [place]  PHYSICIAN:  Rolland Porter, MD DATE OF BIRTH:  01/31/1945  DATE OF PROCEDURE:  09/07/2010 DATE OF DISCHARGE:                           CARDIAC CATHETERIZATION   PRIMARY CARDIOLOGIST:  [name] A. Rollene Fare, MD  PRIMARY CARE PHYSICIAN:  Karen Kays, MD, [facility].  PERFORMING PHYSICIAN:  Rolland Porter, MD  PROCEDURE PERFORMED: 1. Left heart catheterization via 5-French right radial artery access. 2. Native coronary angiography. 3. Left ventriculogram in the RAO projection.  INDICATIONS:  Atypical presentation of abdominal pain with abnormal anterolateral T-wave inversions.  BRIEF HISTORY:  Abigail Wallace is a complicated 65 year old woman with reported history of schizophrenia who had a heart catheterization several years ago for chest discomfort and presented earlier this year in April with chest pain and had a stress test that was negative.  She now presented actually to the emergency room with complaints of feeling pregnant, although she is status post hysterectomy.  With this abdominal pain and discomfort, an EKG was performed, which demonstrated deep T- wave inversions in the leads V3, V4, and V5, which were concerning for anterolateral ischemia.  She was therefore referred for diagnostic catheterization.  As the patient clearly is not capable of providing consent for the procedure, it was discussed with her sister and son who agreed to proceed with her cardiac catheterization for delineation of her coronary anatomy.  Risks, benefits, and alternatives of the case were explained to the family and informed consent was obtained and signed form placed on chart.  PROCEDURE:  The patient was brought to the Second Floor of [facility] Cardiac Catheterization Lab.  She was prepped and draped, but  was not able to lay her legs flat.  Therefore, decision was made to check a modified Allen test with plethysmography demostrating excellent ulnar collateral flow to the right hand with a plan to get a full radial artery access.  She was prepped and draped for radial artery access.  After time-out period was performed, the patient was sedated with intravenous Versed and fentanyl.  The right wrist was anesthetized using 1% subcutaneous lidocaine and a right radial artery was accessed using the Seldinger technique with placement of 5-French glide sheath.  Sheath was aspirated and flushed, and infiltrated with total 10 mL of the standard radial cocktail and the patient received 3500 units of intravenous heparin.  Then, first a 5- [place] JR-4 catheter was advanced over a Versacore wire into ascending aorta.  This was used to engage right coronary artery.  Multiple angiographic views of the right coronary artery system were obtained. Then, the JR-4 catheter was exchanged over a long exchange safety J-wire for a 5-French JL-4 catheter which was advanced and use to engage the left coronary artery.  Multiple angiographic views of the left coronary system were obtained.  The catheter was then exchanged for an angled pigtail catheter which was used to advance across the aortic valve, measurement of left ventricular hemodynamics and then performance of a left ventriculogram.  On the initial left ventriculogram, the catheter pushed back  almost to the aortic valve, and therefore the catheter was then replaced down toward the apex of the heart and a second left ventriculogram was performed.  After completion of left ventriculogram, the catheter was pulled back across the aortic valve measuring pullback gradient.  The catheter was then removed completely out of the body over a wire without any complications.  The sheath was then removed with placement of TR band at 13 mL of air with excellent  nonocclusive hemostasis.  The patient was stable for, during, and after the procedure and no complication.  ESTIMATED BLOOD LOSS:  Less than 10 mL.  CATHETERIZATION LAB STATISTICS: 1. Total of 250 mcg of IV fentanyl and 2 mg of IV Versed were used for     sedation. 2. Radial cocktail dilated in 10 mL of normal saline, 5 mg of     verapamil, 2 mL of lidocaine, and 400 mcg of nitroglycerin. 3. Contrast total was 100 mL. 4. The patient received total of two 250 mL boluses of normal saline.  HEMODYNAMIC RESULTS: 1. Central aortic pressure 110/54 after the IV fluid bolus.  Mean     pressure was 80 mmHg. 2. Left ventricular pressure was 111/6 with EDP of 10 mmHg. 3. Ejection fraction was 45-50% with a strange appearance of potential     outpouching in the basal inferolateral wall that was difficult to     determine if it was just due to heart positioning or some type of     potential abnormality.  ANGIOGRAPHIC FINDINGS: 1. Right coronary artery is a moderate to large-caliber dominant     vessel that gives rise to posterior ascending artery as well as the     posterolateral system with at least 3 posterolateral branches.     There is no significant disease in this coronary artery, maybe a     30% mid lesion. 2. Left main is a very short vessel.  It bifurcates into a circumflex     and LAD.  No significant disease. 3. Circumflex: mostly a large bifurcating obtuse marginal branch with very     small intraventricular groove branch, with no significant disease. 4. The LAD is a moderate to large-caliber vessel reaches around the     apex.  There is 1 large septal perforator with 1 small and 1 large     diagonal branch.  After a second smaller septal perforator, there     is a dissection of 2 hinge points, one was a roughly 40% lesion and     the other one was no real stenosis, but just kind of a hinge point     which consisted of a potential area of intramyocardial coronary     artery with  bridging.  Of note, likely due to injection through a 5-French     catheter into relatively large vessels, there is sluggish flow into     the distal third of all 3 coronary arteries.  IMPRESSION: 1. No significant coronary artery disease, only the 40% lesion in the     left anterior descending with possible area of subendocardial     bridging. 2. Ejection fraction appears to be at least 45%, but there is concern     of some abnormality in the mid-to-basal anterolateral outpouching.  PLAN: 1. Standard post radial care.  We will order 2-D echo to evaluate this     abnormal left ventriculogram. 2. We will contact Family Medicine to assist with further care of her  likely noncardiac issues. 3. She will follow up with Dr. Rollene Fare as outpatient.          ______________________________ Rolland Porter, MD     DWH/MEDQ  D:  09/08/2010  T:  09/08/2010  Job:  [id number]  cc:   [name] A. Rollene Fare, M.D. Karen Kays, MD II Floor, [facility] Cardiac Cath Lab  Electronically Signed by Glenetta Hew MD on 09/10/2010 09:30:55 PM

## 2010-09-12 ENCOUNTER — Ambulatory Visit: Payer: Medicare Other | Admitting: Family Medicine

## 2010-10-02 ENCOUNTER — Encounter (HOSPITAL_COMMUNITY)
Admission: RE | Admit: 2010-10-02 | Discharge: 2010-10-02 | Disposition: A | Discharge status: Home / Self Care | Payer: Medicare Other | Source: Ambulatory Visit | Attending: Oral Surgery | Admitting: Oral Surgery

## 2010-10-02 LAB — BASIC METABOLIC PANEL
BUN: 14 mg/dL (ref 6–23)
CO2: 28 mEq/L (ref 19–32)
Calcium: 9.9 mg/dL (ref 8.4–10.5)
Creatinine, Ser: 0.8 mg/dL (ref 0.50–1.10)
GFR calc non Af Amer: >60 mL/min (ref >60)
Glucose, Bld: 97 mg/dL (ref 70–99)

## 2010-10-02 LAB — CBC
HCT: 34 % — ABNORMAL LOW (ref 36.0–46.0)
Hemoglobin: 12.4 g/dL (ref 12.0–15.0)
MCH: 29.8 pg (ref 26.0–34.0)
MCHC: 36.5 g/dL — ABNORMAL HIGH (ref 30.0–36.0)
MCV: 81.7 fL (ref 78.0–100.0)
RBC: 4.16 MIL/uL (ref 3.87–5.11)

## 2010-10-02 NOTE — Consult Note (Signed)
NAME:  Abigail Wallace, Abigail Wallace             ACCOUNT NO.:  [id number]  MEDICAL RECORD NO.:  [id number]  LOCATION:  [id number]                         FACILITY:  [facility]  PHYSICIAN:  Dickie La, MD        DATE OF BIRTH:  January 02, 1946  DATE OF CONSULTATION:  09/06/2010 DATE OF DISCHARGE:                                CONSULTATION   PRIMARY CARE PROVIDER:  Karen Kays, MD at [facility].  REASON FOR CONSULTATION:  Requested by primary team, Cardiology, to help manage the patient's other significant medical problems during this hospitalization for acute coronary syndrome.  HISTORY OF PRESENT ILLNESS:  This is a 65 year old female presenting with multiple diffuse complaints and being admitted by Cardiology for concern for ACS.  The patient presents with multiple diffuse complaints, including chest pain and pain throughout her body.  Level 5 caveat applied due to the patient being a poor historian due to her being actively psychotic.  The patient reports calling EMS due to her being unable to tolerate her diffuse pains any longer.  The patient does not report her chest pain is being worse than her other pains.  She reports also hurting in her abdomen, back, and legs.  She also reports that her "water broke", she is having vaginal bleeding, and that she is going "into labor."  The patient reports seeing a gynecologist recently.  The patient has had delusions about being pregnant for the past several months.  She saw me in early April in outpatient clinic and despite being reassured by me that she was not pregnant, went to the ED a few times for this reason.  She also underwent inpatient psychiatric treatment for her schizophrenia and was recently discharged within the past few months.  REVIEW OF SYSTEMS:  Denies visual or auditory hallucinations, denies suicidal and homicidal ideation.  Rest of the review of systems deferred due to the patient's confusion.  PAST MEDICAL  HISTORY: 1. Schizophrenia followed by Dr. Ardis Rowan at [facility]. 2. Resting tremor. 3. Anxiety. 4. Hypertension. 5. Coronary artery disease with Persantine Myoview in May 2010,     normal. 6. Sick sinus syndrome status post pacemaker placed in 2007. 7. Chronic systolic and diastolic heart failure with echo on October     2011, showing ejection fraction 35-45%, and abnormal diastolic     relaxation. 8. GERD. 9. Gout. 10.Hyperlipidemia. 11.Peripheral vascular disease. 12.COPD. 13.Overactive bladder and wears diapers at home. 14.Vaginal prolapse. 15.Sleep apnea. 16.Osteoarthritis of right hip and knee.  PAST SURGICAL HISTORY: 1. Tubal ligation. 2. Hysteroscopy and D and C on August 23, 2010, due to persistent     abdominal pain.  SOCIAL HISTORY:  Per patient, lives at home with her son who just began living with her.  The patient previously lived alone.  In her previous outpatient office visit, the patient was always accompanied by her sister who lives nearby.  Smokes cigarettes 1 pack per day.  Denies alcohol or other drugs.  ALLERGIES:  COGENTIN, HALDOL and ABILIFY.  MEDICATIONS: 1. Allopurinol 100 mg p.o. daily. 2. Benztropine 0.5 mg p.o. b.i.d. 3. Depakote 500 mg p.o. q.a.m. 4. Olanzapine 5 mg p.o. in the  a.m. and 15 mg p.o. at bedtime. 5. Temazepam 15 mg p.o. at bedtime. 6. Trazodone 300 mg p.o. at bedtime. 7. Crestor 5 mg p.o. at bedtime. 8. Lasix 20 mg p.o. daily. 9. Lisinopril/hydrochlorothiazide 20/12.5 p.o. daily. 10.Metoprolol 25 mg p.o. daily. 11.Sublingual nitroglycerin 0.4 mg sublingual p.r.n. 12.Ibuprofen 200 mg p.o. p.r.n. 13.KCl 10 mEq p.o. daily.  PHYSICAL EXAMINATION:  VITAL SIGNS:  Temperature 98.3, blood pressure 166/67, heart rate 73, respiratory rate 18, 99% on room air. GENERAL:  Disheveled, agitated, screams in bed. PSYCH:  Non-appropriate to questions.  Oriented to name, date, and place but actively psychotic per HPI. HEENT:  Poor  dentition, moist mucous membranes, no oropharyngeal lesions including erythema or plaques. CARDIOVASCULAR:  Decreased heart sounds, regular rate and rhythm.  No murmurs or gallops, no thrill. PULMONARY:  Coarse breath sounds at bases.  No increased work of breathing, no wheezes, rales or rhonchi. ABDOMEN:  Obese, soft, tender in lower quadrants without guarding or rebound, normoactive bowel sounds. EXTREMITIES:  Mild nonpitting edema bilaterally. MUSCULOSKELETAL:  Tenderness to palpation throughout lower extremities, does not exhibit pain when she is distracted. GU:  Wearing clean diaper.  Full with clear yellow urine. NEURO:  Significant ataxia with finger-to-nose bilaterally, heel-to-shin intact, no other focal deficits, and able to move all extremities equally.  Range of motion in extremities intact.  Extraocular movements cannot be evaluated due to patient noncompliant.  Pupils equal, round, reactive to light, although minimally.  LABS AND IMAGING:  ECG shows sinus rhythm, first-degree AV block, right bundle-branch block which is unchanged from previous EKG, and inverted T- waves which is a new finding. Point of care troponins negative.  CBC white blood count 10.8, hemoglobin 12.9, platelets 265.  I-stat sodium 129, potassium 3.8, chloride 95, BUN 9, creatinine 0.80, glucose 96, calcium 1.20.  Chest x-ray shows no active cardiopulmonary abnormalities.  ASSESSMENT AND PLAN:  This is a 65 year old female with a history of significant cardiac disease including hypertension, chronic heart failure, coronary artery disease, and sick sinus syndrome status post pacer, and history of schizophrenia with several recent episodes of psychosis presenting with new inverted T-waves at ECG and with concern for acute coronary syndrome. 1. Cardiac.  Per cardiology.  The patient is being admitted to     Cardiology Team for acute coronary syndrome.  The patient is being     kept on home metoprolol,  Crestor and Lasix.  Sublingual     nitroglycerin p.r.n.  Will be given heparin bolus and started on     heparin drip.  Will undergo cardiac catheterization tomorrow. 2. Schizophrenia, actively psychotic.  The patient being continued on     home trazodone, Depakote and olanzapine.  We will defer on     Psychiatric consult until the patient is more medically stable.     For the patient's anxiety, currently holding temazepam.  We will     give extra 5 mg dose of olanzapine p.r.n. for anxiety.  We will     consider giving Ativan if persistently agitated. 3. Gastroesophageal reflux disease.  Protonix. 4. Gout.  Continue home allopurinol. 5. Chronic obstructive pulmonary disease, history of tobacco use.  We     will give nicotine patch p.r.n. per patient request. 6. Osteoarthritis.  Tylenol p.r.n. for pain. 7. Overactive bladder.  The patient wears diapers at home and she has     a history of several recent urinary tract infections.  We will     check urinalysis and urine culture to  rule out urinary tract     infection especially with her complaints of abdominal pain and     mildly elevated white blood count. 8. Fluids, electrolytes, nutrition and gastrointestinal.  The patient     will be n.p.o. after midnight for catheterization tomorrow.  Based     on blood pressure and mild pedal edema, be slightly fluid     overloaded which is not different from her baseline.  The patient     being continued on home Lasix.  No intravenous fluids at this time. 9. Disposition.  Pending cardiac workup and clinical improvement.    ______________________________ Karen Kays, MD   ______________________________ Dickie La, MD    AO/MEDQ  D:  09/06/2010  T:  09/07/2010  Job:  [id number]  Electronically Signed by Karen Kays MD on 09/28/2010 11:09:45 AM Electronically Signed by Dorcas Mcmurray MD on 10/02/2010 10:13:05 AM

## 2010-10-03 ENCOUNTER — Ambulatory Visit (HOSPITAL_COMMUNITY)
Admission: RE | Admit: 2010-10-03 | Discharge: 2010-10-03 | Disposition: A | Discharge status: Home / Self Care | Payer: Medicare Other | Source: Ambulatory Visit | Attending: Oral Surgery | Admitting: Oral Surgery

## 2010-10-03 DIAGNOSIS — J4489 Other specified chronic obstructive pulmonary disease: Secondary | ICD-10-CM | POA: Insufficient documentation

## 2010-10-03 DIAGNOSIS — K029 Dental caries, unspecified: Secondary | ICD-10-CM | POA: Insufficient documentation

## 2010-10-03 DIAGNOSIS — Z01812 Encounter for preprocedural laboratory examination: Secondary | ICD-10-CM | POA: Insufficient documentation

## 2010-10-03 DIAGNOSIS — Z950 Presence of cardiac pacemaker: Secondary | ICD-10-CM | POA: Insufficient documentation

## 2010-10-03 DIAGNOSIS — I10 Essential (primary) hypertension: Secondary | ICD-10-CM | POA: Insufficient documentation

## 2010-10-03 DIAGNOSIS — J449 Chronic obstructive pulmonary disease, unspecified: Secondary | ICD-10-CM | POA: Insufficient documentation

## 2010-10-03 DIAGNOSIS — F209 Schizophrenia, unspecified: Secondary | ICD-10-CM | POA: Insufficient documentation

## 2010-10-03 LAB — BASIC METABOLIC PANEL
BUN: 8 mg/dL (ref 6–23)
CO2: 28 mEq/L (ref 19–32)
Calcium: 9.3 mg/dL (ref 8.4–10.5)
GFR calc non Af Amer: >60 mL/min (ref >60)
Glucose, Bld: 92 mg/dL (ref 70–99)

## 2010-10-04 NOTE — Op Note (Signed)
NAME:  [name], Pricila             ACCOUNT NO.:  [id number]  MEDICAL RECORD NO.:  [id number]  LOCATION:  SDSC                         FACILITY:  [facility]  PHYSICIAN:  Gae Bon, M.D.  DATE OF BIRTH:  Jun 24, 1945  DATE OF PROCEDURE:  10/03/2010 DATE OF DISCHARGE:                              OPERATIVE REPORT   PREOPERATIVE DIAGNOSIS:  Nonrestorable teeth #3, #7, #8, #12, #22, #23, #24, #25, #26, #27, #28.  POSTOPERATIVE DIAGNOSIS:  Nonrestorable teeth #3, #7, #8, #12, #22, #23, #24, #25, #26, #27, #28.  PROCEDURE:  Removal of teeth numbers per above, alveoplasty right and left maxilla and mandible.  SURGEON:  Gae Bon, MD  ANESTHESIA:  General nasal, Dr. Al Corpus, attending.  ASSISTANTS:  Nixon and Simlar.  INDICATIONS FOR PROCEDURE:  Paden is a 65 year old white female who is referred by her general dentist for removal of all remaining teeth so that she could have dentures fabricated.  She has past medical history significant for COPD, hypertension, angina, pacemaker, schizophrenia, and was stable for surgery, but is in rather poor health because of the need for adequate anesthesia, and need for general.  The surgery was scheduled for intubation for airway protection at [facility]  PROCEDURE:  The patient was taken to the operating room, placed on the table in supine position.  General anesthesia was administered intravenously and a nasal endotracheal tube was placed and secured.  The eyes were protected.  The patient was draped for the procedure. Posterior pharynx was suctioned with Yankauer suction and throat pack was placed, 2% lidocaine 1:100,000 epinephrine was infiltrated in an inferior alveolar block on the right and left side, and buccal and palatal infiltration around the maxillary teeth.  A #15 blade was used to make a full-thickness incision beginning in the left mandible around tooth #22 and carrying laterally to tooth #28 on the buccal and lingual surfaces  of teeth.  The periosteum was reflected with periosteal elevator.  A 301 elevator was used to mobilize the teeth.  Then the teeth were removed with the Ash dental forceps.  The sockets were then curetted and attention was turned to the maxilla.  A 15-blade was used to make a full-thickness incision around teeth #7, #8, and #12.  This incision was connected and wedges of tissue removed at the distal and medial aspects of the teeth so that primary closure could be achieved. The periosteum was reflected with a periosteal elevator.  The teeth were elevated with a 301 elevator and removed from the mouth with the upper #150 forceps.  Sockets were then curetted, then the periosteum was reflected in the maxilla and mandible to expose the alveolar bone.  The egg-shaped bur and bone file were then used to perform alveoplasty in the maxilla and mandible.  Then the areas were irrigated and then closed with 3-0 chromic.  Then bite-block was repositioned on the left side of the mouth and attention was turned to the right side.  A 15-blade was used to make a full-thickness incision around tooth #3.  The periosteum was reflected with periosteal elevator and the tooth was then elevated with a 301 elevator and removed with the upper universal forceps.  The  socket was curetted, irrigated, and closed with 3-0 chromic.  Then the oral cavity was inspected, found to have good contour, closure, and hemostasis.  The oral cavity was irrigated and suctioned and then throat pack was removed.  The patient was awakened, taken to recovery room breathing spontaneously in good condition.  ESTIMATED BLOOD LOSS:  Minimum.  COMPLICATIONS:  None.  SPECIMENS:  None.  She was scheduled for discharge later today to the same-day surgery unit.  She was given a prescription for Percocet 5/325, #40 one to two every 4 to 6 hours p.r.n. pain.  She was given routine postoperative instructions including soft diet, ice to face,  warm salt water rinses, and was instructed to contact the office for an appointment for followup in 2 weeks.     Gae Bon, M.D.     SMJ/MEDQ  D:  10/03/2010  T:  10/03/2010  Job:  [id number]  Electronically Signed by Diona Browner M.D. on 10/04/2010 11:22:52 AM

## 2010-10-15 NOTE — Discharge Summary (Signed)
NAME:  Abigail Wallace, Abigail Wallace             ACCOUNT NO.:  [id number]  MEDICAL RECORD NO.:  [id number]  LOCATION:  [id number]                         FACILITY:  [facility]  PHYSICIAN:  Quay Burow, M.D.   DATE OF BIRTH:  1945-07-08  DATE OF ADMISSION:  09/06/2010 DATE OF DISCHARGE:  09/08/2010                              DISCHARGE SUMMARY   DISCHARGE DIAGNOSES: 1. Abdominal pain, negative myocardial infarction, normal cardiac     catheterization with nonobstructive 40% left anterior descending     coronary artery stenosis and some bridging of myocardium. 2. Left ventricular dysfunction, ejection fraction 45-50% by cardiac     catheterization.  On 2D echo done this admission, ejection fraction     was 65-70% and there was grade 1 diastolic dysfunction and no     evidence for left ventricular aneurysm or pseudoaneurysm that was     possibly suggested on cardiac catheterization.  This is improved     ejection fraction from an echo in 2011.  At that time, her EF was     35-45%. 3. Abnormal EKG this admission with deep T-wave inversions inferiorly     and laterally, negative myocardial infarction. 4. History of complete heart block with permanent transvenous     pacemaker implanted in 2007. 5. Schizophrenia with psychosomatic complaints.  At this time,     concerning abdominal pain, stated she was pregnant and she was     going to deliver. 6. Dyslipidemia. 7. Tobacco abuse, nicotine addiction. 8. Diabetes mellitus type 2.  DISCHARGE CONDITION:  Stable.  Abdominal pain resolved.  Please note son is concerned about her psych issues.  She needs to see psychiatrist on Monday if further problems over the weekend or increased problems.  Currently, she is stable.  She will need ER visit for possible committing her to a [employer].  DISCHARGE INSTRUCTIONS: 1. As stated, concerning psychiatric issues.  Otherwise, increase     activity slowly, may shower, no lifting for 1 week, no driving.  Low-sodium, heart-healthy, and diabetic diet.  Wash cath site with     soap and water.  Call if any bleeding, swelling, or drainage.     Follow up with Dr. Rollene Fare on September 21, 2010, at 4 p.m. 2. See psychiatrist Monday. 3. Follow up with Family Practice as instructed.  HOSPITAL COURSE:  A 64 year old schizophrenic female was seen and admitted through the emergency room at [facility], complained of abdominal pain.  She had a significantly abnormal EKG with the right bundle-branch block with significant T-wave inversions in leads II, III, aVF and the leads V3 through V5 which is different from her previous EKGs.  She had complained of chest pain as well as abdominal pain for the last couple of days.  She was psychotic on admission.  She believes herself to be pregnant and needs to deliver.  On admission, she did not complain of chest pain, had a history of minimal coronary artery disease in 2007, low-risk Myoview in April 2012.  Her last pacemaker interrogation was July 20, 2010.  She has pacemaker for complete heart block and previously had LV dysfunction on an echo done in 2011.  EF 35-45%.  She had also  recently been hospitalized off and on at [facility]. Recently, her son had her committed in [place].  She stopped the medications she was taking for approximately 25 years because it may cause Parkinson's and it caused her to be afraid of Parkinson's, so she stopped those medications and medications since that time have not adequately controlled her symptoms.  She was admitted to rule out MI. Cardiac catheterization was determined that the best way to get information concerning her coronaries as she is psychotic and may continue with psychosomatic complaints.  Cardiac enzymes were negative and cardiac catheterization revealed nonobstructive coronary artery disease with 40% LAD stenosis and perhaps some myocardial bridging. There was a question also on the cardiac  catheterization.  There may be some outpouching on the left ventriculogram, so 2D echo was done and there was no aneurysm or pseudoaneurysm of the LV found.  The patient also, because of the abdominal pain, underwent abdominal ultrasound.  No acute findings to explain the patient's history of pain. Abdominal aorta had a focal bulge, noted to be 2.4 cm in diameter.  She had a 7-mm cyst in the left kidney and a 16-mm cyst in the right kidney. The gallbladder was absent, bile duct not dilated at 3-4-mm diameter.  Chest x-ray had been done on May 07, 2010, on admission, no active cardiopulmonary abnormality.  A 2D echo as previously reported and cardiac cath as previously reported.  By September 08, 2010, the patient was seen by Dr. Debara Pickett and felt increasing beta-blocker to 37.5 twice a day and she would be discharged home and she was also consulted on tobacco cessation.  Her son is aware of taking her to see a psychiatrist on Monday or to the ER for psych admission during the weekend.  LABORATORY DATA:  Hemoglobin stable at 13.6, hematocrit 40 on admission, stable at discharge, WBC on admission was 10.8, at discharge 9.7, platelets 265.  ProTime 13, INR 0.96, PTT 27.  On heparin, she was therapeutic.  Chemistry; she is hyponatremic, sodiums ranged from 128- 135, potassium 3.6, chloride 94, CO2 26, glucose 87, BUN 6, creatinine 0.56, calcium 9.1, and magnesium 2.0.  Hemoglobin A1c was 5.1.  Troponin I was less than 0.30.  TSH was 1.912.  Depakote level was 67.2.  UA showed a small amount of blood, but otherwise clear and urine culture was negative with no growth.  The patient will be discharged as instructed.     Otilio Carpen. Dorene Ar, N.P.   ______________________________ Quay Burow, M.D.    LRI/MEDQ  D:  09/08/2010  T:  09/09/2010  Job:  [id number]  cc:   [facility]  Electronically Signed by Cecilie Kicks N.P. on 09/13/2010 11:12:24  AM Electronically Signed by Quay Burow M.D. on 10/15/2010 11:41:12 AM

## 2010-10-15 NOTE — H&P (Signed)
NAME:  [name], [name]             ACCOUNT NO.:  [id number]  MEDICAL RECORD NO.:  [id number]  LOCATION:                                 FACILITY:  PHYSICIAN:  Dr. Gwenlyn Found              DATE OF BIRTH:  January 24, 1945  DATE OF ADMISSION: DATE OF DISCHARGE:                             HISTORY & PHYSICAL   CHIEF COMPLAINT:  Abdominal pain.  HISTORY OF PRESENT ILLNESS:  [name] is a 65 year old schizophrenic female seen in the emergency room, now complaining of abdominal pain. We were asked to see her because of an abnormal EKG.  Her EKG shows a right bundle-branch block with significant T-wave inversion in lead II, III, aVF, and V3-V5.  This is different than previous EKGs.  On further review with the patient's family, the son says she had complained of chest pain on the last couple of days.  The patient is currently psychotic, she thinks she is pregnant and needs to deliver.  She is currently not complaining of chest pain.  She has a history of minor coronary disease in 2007 with a 30% blockage.  She had a low-risk Myoview in April 2012 in our office.  She just saw Dr. Rollene Fare on July 20, 2010, he checks her pacemaker.  She has had a pacemaker put in for heart block, this was done in September 2007.  She does have some degree of LV dysfunction, an echocardiogram done recently in October 2011 showed her EF to be [id number] with diastolic dysfunction.  Dr. Rollene Fare saw her in the office on July 20, 2010.  She was doing relatively well. Her son has moved in with her this summer.  She is admitted now because of her abnormal EKG.  PAST MEDICAL HISTORY:  Remarkable for complete heart block, she had a pacemaker implanted in September 2007.  She had minor coronary disease in 2007 as noted.  She has type 2 non-insulin-dependent diabetes.  She has schizophrenia.  She has had hospitalizations off and on at behavioral health.  She has treated dyslipidemia.  PAST SURGICAL HISTORY:  Previous  surgeries include laparoscopic cholecystectomy, tubal ligation, and I believe she had a vaginal hysterectomy on August 6 this year at [facility]:  Her home medications per Dr. Lowella Fairy note from July 20, 2010, are; 1. Trazodone 100 mg a day. 2. Potassium 10 mEq a day. 3. Depakote 500 mg a day. 4. Ibuprofen p.r.n. 5. Lopressor 25 mg, I believe that is b.i.d. 6. Crestor 5 mg a day. 7. Lasix 20 mg a day. 8. Allopurinol 100 mg a day. 9. Olanzapine 15 mg at bedtime and 5 mg q.a.m.  ALLERGIES:  She has multiple drug intolerances including ABILIFY, HALDOL, and NABUMETONE.  SOCIAL HISTORY:  She does smoke at least a pack a day.  She has 3 children, 11 grandchildren, and 5 great-grandchildren.  As noted above, she lives in her home and her son lives with her.  FAMILY HISTORY:  Remarkable for coronary artery disease and diabetes.  REVIEW OF SYSTEMS:  Essentially unremarkable except for noted above. Son says she was recently treated for a UTI with Cipro as  an outpatient.  PHYSICAL EXAM:  VITAL SIGNS:  Blood pressure 129/61, pulse 75, temperature 98.3. GENERAL:  She is a chronically ill-appearing female, in no acute distress. HEENT:  Normocephalic.  She has poor dentition. NECK:  Without JVD or bruit. CHEST:  Clear to auscultation and percussion. CARDIAC:  Regular rate and rhythm without obvious murmur, rub, or gallop. ABDOMEN:  Nontender, nondistended. EXTREMITIES:  Without edema. NEURO:  Grossly intact.  She moves all extremities without obvious deficit.  She is psychotic currently, claiming she is here for delivery.  LABS:  White count 10.8, hemoglobin 12.9, hematocrit 35.7, platelets 265.  Sodium 129, potassium 3.8, BUN 9, creatinine 0.8.  Troponin is negative x1.  Chest x-ray shows no acute abnormalities.  Her EKG shows sinus rhythm with a right bundle-branch block, first-degree AV block, and diffuse T-wave inversion, most prominent in lead II, III, aVF,  and V3-V5.  IMPRESSION: 1. Abnormal EKG, rule out myocardial infarction. 2. Minor coronary disease in 2007 with low-risk Myoview in April 2012. 3. Cardiomyopathy, apparently nonischemic, EF 35% by echocardiogram in     October 2011. 4. Type 2 non-insulin-dependent diabetes. 5. Schizophrenia, currently psychotic. 6. Complete heart block status post pacemaker implant in 2007. 7. Treated dyslipidemia. 8. History of smoking.  PLAN:  The patient was seen by Dr. Gwenlyn Found and myself in the emergency room.  We will go ahead and admit her to rule out MI.  She may need diagnostic catheterization to be sure there is nothing active going on, her T-wave inversion is impressive on her EKG.  We have asked the family practice service to get involved as well and if her cardiac eval is normal we can transfer to their service.     Erlene Quan, P.A.   ______________________________ Dr. Janett Billow  D:  09/06/2010  T:  09/06/2010  Job:  [id number]  Electronically Signed by Kerin Ransom P.A. on 09/11/2010 09:38:20 AM Electronically Signed by Quay Burow M.D. on 10/15/2010 11:41:14 AM

## 2010-10-20 ENCOUNTER — Emergency Department (HOSPITAL_COMMUNITY): Payer: Medicare Other

## 2010-10-20 ENCOUNTER — Emergency Department (HOSPITAL_COMMUNITY)
Admission: EM | Admit: 2010-10-20 | Discharge: 2010-10-20 | Disposition: A | Discharge status: Home / Self Care | Payer: Medicare Other | Attending: Emergency Medicine | Admitting: Emergency Medicine

## 2010-10-20 DIAGNOSIS — R079 Chest pain, unspecified: Secondary | ICD-10-CM | POA: Insufficient documentation

## 2010-10-20 DIAGNOSIS — F205 Residual schizophrenia: Secondary | ICD-10-CM | POA: Insufficient documentation

## 2010-10-20 DIAGNOSIS — F411 Generalized anxiety disorder: Secondary | ICD-10-CM | POA: Insufficient documentation

## 2010-10-20 DIAGNOSIS — Z8639 Personal history of other endocrine, nutritional and metabolic disease: Secondary | ICD-10-CM | POA: Insufficient documentation

## 2010-10-20 DIAGNOSIS — Z862 Personal history of diseases of the blood and blood-forming organs and certain disorders involving the immune mechanism: Secondary | ICD-10-CM | POA: Insufficient documentation

## 2010-10-20 DIAGNOSIS — F29 Unspecified psychosis not due to a substance or known physiological condition: Secondary | ICD-10-CM | POA: Insufficient documentation

## 2010-10-20 DIAGNOSIS — K219 Gastro-esophageal reflux disease without esophagitis: Secondary | ICD-10-CM | POA: Insufficient documentation

## 2010-10-20 DIAGNOSIS — E785 Hyperlipidemia, unspecified: Secondary | ICD-10-CM | POA: Insufficient documentation

## 2010-10-20 DIAGNOSIS — I10 Essential (primary) hypertension: Secondary | ICD-10-CM | POA: Insufficient documentation

## 2010-10-20 DIAGNOSIS — I739 Peripheral vascular disease, unspecified: Secondary | ICD-10-CM | POA: Insufficient documentation

## 2010-10-20 DIAGNOSIS — R110 Nausea: Secondary | ICD-10-CM | POA: Insufficient documentation

## 2010-10-20 DIAGNOSIS — R0602 Shortness of breath: Secondary | ICD-10-CM | POA: Insufficient documentation

## 2010-10-20 LAB — CK TOTAL AND CKMB (NOT AT ARMC)
Relative Index: INVALID (ref 0.0–2.5)
Total CK: 47 U/L (ref 7–177)

## 2010-10-20 LAB — COMPREHENSIVE METABOLIC PANEL
ALT: 7 U/L (ref 0–35)
AST: 11 U/L (ref 0–37)
Albumin: 3.7 g/dL (ref 3.5–5.2)
Calcium: 9.9 mg/dL (ref 8.4–10.5)
Creatinine, Ser: 0.8 mg/dL (ref 0.50–1.10)
Sodium: 132 mEq/L — ABNORMAL LOW (ref 135–145)

## 2010-10-20 LAB — SAMPLE TO BLOOD BANK

## 2010-10-20 LAB — DIFFERENTIAL
Basophils Absolute: 0.1 10*3/uL (ref 0.0–0.1)
Eosinophils Relative: 1 % (ref 0–5)
Lymphocytes Relative: 29 % (ref 12–46)
Lymphs Abs: 3 10*3/uL (ref 0.7–4.0)
Monocytes Absolute: 0.5 10*3/uL (ref 0.1–1.0)
Neutro Abs: 6.8 10*3/uL (ref 1.7–7.7)

## 2010-10-20 LAB — CBC
HCT: 35.1 % — ABNORMAL LOW (ref 36.0–46.0)
Hemoglobin: 12.4 g/dL (ref 12.0–15.0)
MCHC: 35.3 g/dL (ref 30.0–36.0)
MCV: 83.6 fL (ref 78.0–100.0)
RDW: 13.3 % (ref 11.5–15.5)
WBC: 10.5 10*3/uL (ref 4.0–10.5)

## 2010-10-20 LAB — RAPID URINE DRUG SCREEN, HOSP PERFORMED
Amphetamines: NOT DETECTED
Benzodiazepines: NOT DETECTED
Cocaine: NOT DETECTED
Opiates: NOT DETECTED

## 2010-10-20 LAB — LIPASE, BLOOD: Lipase: 72 U/L — ABNORMAL HIGH (ref 11–59)

## 2010-10-20 LAB — ACETAMINOPHEN LEVEL: Acetaminophen (Tylenol), Serum: <15 ug/mL (ref 10–30)

## 2010-10-20 LAB — SALICYLATE LEVEL: Salicylate Lvl: <2 mg/dL — ABNORMAL LOW (ref 2.8–20.0)

## 2010-10-20 LAB — POCT I-STAT TROPONIN I: Troponin i, poc: 0 ng/mL (ref 0.00–0.08)

## 2010-10-23 ENCOUNTER — Ambulatory Visit: Payer: Medicare Other | Admitting: Family Medicine

## 2010-10-25 LAB — BASIC METABOLIC PANEL
BUN: 10
CO2: 25
CO2: 26
Calcium: 10
Chloride: 104
Chloride: 99
Creatinine, Ser: 1.31 — ABNORMAL HIGH
Creatinine, Ser: 1.45 — ABNORMAL HIGH
GFR calc Af Amer: 44 — ABNORMAL LOW
GFR calc Af Amer: 50 — ABNORMAL LOW
Glucose, Bld: 106 — ABNORMAL HIGH
Sodium: 134 — ABNORMAL LOW

## 2010-10-25 LAB — CBC
MCHC: 32.7
MCV: 83.4
RBC: 4.24
RDW: 15.3 — ABNORMAL HIGH

## 2010-10-25 LAB — MAGNESIUM: Magnesium: 1.7

## 2010-10-26 LAB — BASIC METABOLIC PANEL
BUN: 6
BUN: 6
BUN: 8
CO2: 23
CO2: 23
CO2: 23
CO2: 23
CO2: 27
CO2: 28
CO2: 28
CO2: 28
CO2: 28
Calcium: 9.2
Chloride: 104
Chloride: 105
Chloride: 106
Chloride: 106
Chloride: 108
Chloride: 112
Chloride: 113 — ABNORMAL HIGH
Creatinine, Ser: 0.95
Creatinine, Ser: 1.05
Creatinine, Ser: 1.27 — ABNORMAL HIGH
GFR calc Af Amer: >60
GFR calc Af Amer: >60
GFR calc Af Amer: >60
GFR calc Af Amer: >60
GFR calc non Af Amer: 55 — ABNORMAL LOW
GFR calc non Af Amer: 58 — ABNORMAL LOW
GFR calc non Af Amer: >60
Glucose, Bld: 100 — ABNORMAL HIGH
Glucose, Bld: 105 — ABNORMAL HIGH
Glucose, Bld: 109 — ABNORMAL HIGH
Glucose, Bld: 115 — ABNORMAL HIGH
Glucose, Bld: 91
Potassium: 3.3 — ABNORMAL LOW
Potassium: 3.4 — ABNORMAL LOW
Potassium: 3.6
Potassium: 3.8
Potassium: 3.9
Potassium: 3.9
Potassium: 4.1
Sodium: 139
Sodium: 142
Sodium: 143
Sodium: 144

## 2010-10-26 LAB — COMPREHENSIVE METABOLIC PANEL
ALT: 15
AST: 23
Albumin: 3.7
CO2: 25
Calcium: 9.1
Creatinine, Ser: 1.44 — ABNORMAL HIGH
GFR calc Af Amer: 44 — ABNORMAL LOW
GFR calc non Af Amer: 37 — ABNORMAL LOW
Sodium: 139
Total Protein: 6.9

## 2010-10-26 LAB — CBC
HCT: 34.4 — ABNORMAL LOW
Hemoglobin: 11.3 — ABNORMAL LOW
MCHC: 33
MCHC: 33.1
MCHC: 33.2
MCV: 82.8
MCV: 83
Platelets: 293
Platelets: 303
Platelets: 303
RDW: 14.9 — ABNORMAL HIGH
RDW: 15.1 — ABNORMAL HIGH

## 2010-10-26 LAB — PROTEIN, URINE, 24 HOUR
Collection Interval-UPROT: 24
Protein, 24H Urine: 1219 — ABNORMAL HIGH
Urine Total Volume-UPROT: 5300

## 2010-10-26 LAB — DIFFERENTIAL
Basophils Absolute: 0
Basophils Relative: 0
Eosinophils Absolute: 0.2
Eosinophils Relative: 1
Eosinophils Relative: 1
Lymphocytes Relative: 23
Lymphs Abs: 3.4 — ABNORMAL HIGH
Monocytes Relative: 6

## 2010-10-26 LAB — PROTEIN ELECTROPH W RFLX QUANT IMMUNOGLOBULINS
Albumin ELP: 55.1 — ABNORMAL LOW
Alpha-1-Globulin: 5.2 — ABNORMAL HIGH
Alpha-2-Globulin: 12.8 — ABNORMAL HIGH
Beta Globulin: 6.7
M-Spike, %: NOT DETECTED
Total Protein ELP: 5.6 — ABNORMAL LOW

## 2010-10-26 LAB — SODIUM, URINE, RANDOM: Sodium, Ur: 29

## 2010-10-26 LAB — URINALYSIS, ROUTINE W REFLEX MICROSCOPIC
Bilirubin Urine: NEGATIVE
Glucose, UA: NEGATIVE
Glucose, UA: NEGATIVE
Hgb urine dipstick: NEGATIVE
Hgb urine dipstick: NEGATIVE
Protein, ur: 100 — AB
Protein, ur: >300 — AB
Urobilinogen, UA: 0.2
pH: 6

## 2010-10-26 LAB — URINE CULTURE
Colony Count: 15000
Colony Count: >=100000
Colony Count: NO GROWTH

## 2010-10-26 LAB — PROTEIN / CREATININE RATIO, URINE
Creatinine, Urine: 32.9
Total Protein, Urine: 28

## 2010-10-26 LAB — URINE MICROSCOPIC-ADD ON

## 2010-10-26 LAB — UIFE/LIGHT CHAINS/TP QN, 24-HR UR
Alpha 2, Urine: DETECTED — AB
Free Kappa Lt Chains,Ur: 2.51 — ABNORMAL HIGH (ref 0.04–1.51)
Free Kappa/Lambda Ratio: 11.95 — ABNORMAL HIGH (ref 0.46–4.00)
Free Lambda Excretion/Day: 11.13
Free Lt Chn Excr Rate: 133.03
Gamma Globulin, Urine: NOT DETECTED
Time: 24
Volume, Urine: 5300

## 2010-10-26 LAB — CREATININE, URINE, RANDOM: Creatinine, Urine: 484.5

## 2010-10-26 LAB — CULTURE, BLOOD (ROUTINE X 2): Culture: NO GROWTH

## 2010-10-26 LAB — SEDIMENTATION RATE: Sed Rate: 15

## 2010-10-26 LAB — IGG, IGA, IGM: IgA: 117

## 2010-10-26 LAB — LIPID PANEL
Cholesterol: 99
Total CHOL/HDL Ratio: 2.4

## 2010-10-26 LAB — CLOZAPINE (CLOZARIL): Clozapine Lvl: 1236 ng/mL — AB

## 2010-10-26 LAB — CHLAMYDIA PROBE AMPLIFICATION, URINE: Chlamydia, Swab/Urine, PCR: NEGATIVE

## 2010-10-26 LAB — CORTISOL-AM, BLOOD: Cortisol - AM: 18.5

## 2010-10-30 ENCOUNTER — Telehealth: Payer: Self-pay | Admitting: Family Medicine

## 2010-10-30 NOTE — Telephone Encounter (Signed)
[name] called back because she wanted Dr. Verdie Drown to know that she only has 5 diapers left so she really needs this approved today.

## 2010-10-30 NOTE — Telephone Encounter (Addendum)
Is needing to have disposable diapers called in for her - she uses Abigail Wallace

## 2010-10-31 NOTE — Telephone Encounter (Signed)
If you will just write a Rx for the Diapers and drop it off we can fax it.  The fax number is [phone] Alette Kataoka, [name]

## 2010-10-31 NOTE — Telephone Encounter (Signed)
What is the number to [facility]?

## 2010-10-31 NOTE — Telephone Encounter (Signed)
I will drop off the Rx tomorrow.

## 2010-11-01 MED ORDER — DIAPERS & SUPPLIES MISC: 1.0000 [IU] | Freq: Every day | Status: DC | PRN

## 2010-11-01 NOTE — Telephone Encounter (Signed)
[name], I put Rx at your work station. Thank you.

## 2010-11-01 NOTE — Telephone Encounter (Signed)
Rx faxed Tymeir [place]ath[place], [name]

## 2010-11-01 NOTE — Telephone Encounter (Signed)
Addended by: Verdie Drown, Levada Dy J on: 11/01/2010 01:04 PM   Modules accepted: Orders

## 2010-11-02 ENCOUNTER — Encounter: Payer: Self-pay | Admitting: Family Medicine

## 2010-11-02 ENCOUNTER — Other Ambulatory Visit: Payer: Self-pay | Admitting: Family Medicine

## 2010-11-02 ENCOUNTER — Ambulatory Visit (INDEPENDENT_AMBULATORY_CARE_PROVIDER_SITE_OTHER): Payer: Medicare Other | Admitting: Family Medicine

## 2010-11-02 VITALS — BP 135/75 | HR 71 | Temp 97.5°F

## 2010-11-02 DIAGNOSIS — IMO0001 Reserved for inherently not codable concepts without codable children: Secondary | ICD-10-CM | POA: Insufficient documentation

## 2010-11-02 DIAGNOSIS — F209 Schizophrenia, unspecified: Secondary | ICD-10-CM

## 2010-11-02 DIAGNOSIS — R030 Elevated blood-pressure reading, without diagnosis of hypertension: Secondary | ICD-10-CM

## 2010-11-02 NOTE — Patient Instructions (Signed)
Good to see you I think it would be a benefit for you to take all your medicine Keep your appointments with your doctors I want to see you again in 1 month.

## 2010-11-02 NOTE — Assessment & Plan Note (Signed)
Pt was not going to compromise with any of her medications at this time.  Pt had many signs of schizophrenia including likely internal dialogue.  Pt son who was called during exam states he is concern as well giving stories with pt likely visual hallucinations.  Pt though had poor judgement but seemed competent and was aware of the date, surrounding and the potential side effects of her action. It was reccommended to pt that she should restart the zyprexa or I would be willing to change her back to her original medication.  Pt declined. Then recommenced to pt that inpt therapy would be a good route as well which she declined as well. I gave pt as well as pt's son red flags to look out for in case need medical attention including suicidal or homicidal ideation. Pt then went to wait to be picked up by son. Pt will return in 1 month or sooner if needed or she can call if she wants to change her medicine.

## 2010-11-02 NOTE — Progress Notes (Signed)
  Subjective:    Patient ID: Abigail Wallace, female    DOB: 07-12-1945, 65 y.o.   MRN: [id number]  HPI 65 year old female with a history of paranoid schizophrenia who is coming in for followup after hospitalization in the near behavioral [facility]. Patient states that this was not help at all and that they discharged her with some medication changes that she he will not take. Patient shows that she has a prescription for Zyprexa and states that she will not take it because it makes her shake. Patient states that it is a cousin of clorazril which she states she did not like either.  Patient states that she has seen a lot of doctors recently including some that pulled her teeth and that she wants a full workup today. When discussing what a full workup is patient states she would like her blood drawn and a picture of her belly in case she is pregnant   *With the express consent of the patient I did call patient's son Adriana Simas at [phone] discussed with his mother with him. He has concerns and did tell a story up last night of patient being on the patio be been the ground with a broom because there were snakes. Patient states that there were no states at that time son of patient states that he has been concerned but found it easier for him not to address the situation most of the time. Patient's son was the one who did have her admitted to the near behavioral health but it did not seem to make it improvement patient son was told that it would be a good idea for him to go to her November 5 appointment with behavioral outpatient clinic to discuss these concerns were thoroughly   Review of Systems Denies fevers or chills states from time to time she has abdominal pain denies shortness of breath or chest pain patient though states if I make her take this medications she will have all of those problems    Objective:   Physical Exam General appearance: alert and no distress Eyes: negative Throat:  lips, mucosa, and tongue normal; teeth and gums normal Neck: no adenopathy, no carotid bruit, supple, symmetrical, trachea midline and thyroid not enlarged, symmetric, no tenderness/mass/nodules Heart: S1, S2 normal and systolic murmur: early systolic 1/6, rumbling at apex Abdomen: soft, non-tender; bowel sounds normal; no masses,  no organomegaly Extremities: extremities normal, atraumatic, no cyanosis or edema Pulses: 2+ and symmetric Neurologic: Grossly normal Psych: Patient has significant paranoia patient is very confrontational but not aggressive patient seems to have some mild pressure speech and has taken gentle thought process with racing thoughts. Patient denies any homicidal or suicidal ideation the patient though does seem competent overall patient denies causing any harm to herself or anyone else    Assessment & Plan:

## 2010-11-03 LAB — COMPREHENSIVE METABOLIC PANEL
ALT: <8 U/L (ref 0–35)
CO2: 20 mEq/L (ref 19–32)
Calcium: 9.3 mg/dL (ref 8.4–10.5)
Chloride: 98 mEq/L (ref 96–112)
Creat: 0.88 mg/dL (ref 0.50–1.10)
Glucose, Bld: 83 mg/dL (ref 70–99)

## 2010-11-06 ENCOUNTER — Telehealth: Payer: Self-pay | Admitting: Family Medicine

## 2010-11-06 NOTE — Telephone Encounter (Signed)
Spoke with patient, and informed her that sodium level was a little low but otherwise labs were normal. Patient states that "I have infection and the labs were wrong" then patient states "well you tried" and hung up.

## 2010-11-06 NOTE — Telephone Encounter (Signed)
[name] would like a call back from one of the nurses.  She needs the results from her tests.

## 2010-11-13 ENCOUNTER — Encounter: Payer: Self-pay | Admitting: Family Medicine

## 2010-11-13 DIAGNOSIS — F209 Schizophrenia, unspecified: Secondary | ICD-10-CM

## 2010-11-13 NOTE — Assessment & Plan Note (Signed)
Documentation only.  Discharged from [facility] 10/12. Admitted for psychotic disorder, NOS. @ time of D/C, was A&O x 4, normal speech. But continued to be delusional, which was thought to be baseline for this patient. Limited insight and judgment. Px thought guarded given her h/o noncompliance.  D/C psychiatric meds: olanzepine 5 qam, 15 qhs, temazepam 15 qhs, depakote 500 qhs, trazodone 300 qhs.  Seen by Dr. Karna Christmas.

## 2010-11-18 ENCOUNTER — Emergency Department (HOSPITAL_COMMUNITY): Payer: Medicare Other

## 2010-11-18 ENCOUNTER — Inpatient Hospital Stay (HOSPITAL_COMMUNITY)
Admission: EM | Admit: 2010-11-18 | Discharge: 2010-11-20 | DRG: 885 | Disposition: A | Discharge status: Home / Self Care | Payer: Medicare Other | Attending: Internal Medicine | Admitting: Internal Medicine

## 2010-11-18 DIAGNOSIS — M169 Osteoarthritis of hip, unspecified: Secondary | ICD-10-CM | POA: Diagnosis present

## 2010-11-18 DIAGNOSIS — Z79899 Other long term (current) drug therapy: Secondary | ICD-10-CM

## 2010-11-18 DIAGNOSIS — E119 Type 2 diabetes mellitus without complications: Secondary | ICD-10-CM | POA: Diagnosis present

## 2010-11-18 DIAGNOSIS — Z9079 Acquired absence of other genital organ(s): Secondary | ICD-10-CM

## 2010-11-18 DIAGNOSIS — R3129 Other microscopic hematuria: Secondary | ICD-10-CM | POA: Diagnosis present

## 2010-11-18 DIAGNOSIS — N8110 Cystocele, unspecified: Secondary | ICD-10-CM | POA: Diagnosis present

## 2010-11-18 DIAGNOSIS — N760 Acute vaginitis: Secondary | ICD-10-CM | POA: Diagnosis present

## 2010-11-18 DIAGNOSIS — I509 Heart failure, unspecified: Secondary | ICD-10-CM | POA: Diagnosis present

## 2010-11-18 DIAGNOSIS — R609 Edema, unspecified: Secondary | ICD-10-CM | POA: Diagnosis present

## 2010-11-18 DIAGNOSIS — J4489 Other specified chronic obstructive pulmonary disease: Secondary | ICD-10-CM | POA: Diagnosis present

## 2010-11-18 DIAGNOSIS — Z9119 Patient's noncompliance with other medical treatment and regimen: Secondary | ICD-10-CM

## 2010-11-18 DIAGNOSIS — F17200 Nicotine dependence, unspecified, uncomplicated: Secondary | ICD-10-CM | POA: Diagnosis present

## 2010-11-18 DIAGNOSIS — Z9851 Tubal ligation status: Secondary | ICD-10-CM

## 2010-11-18 DIAGNOSIS — M109 Gout, unspecified: Secondary | ICD-10-CM | POA: Diagnosis present

## 2010-11-18 DIAGNOSIS — Z950 Presence of cardiac pacemaker: Secondary | ICD-10-CM

## 2010-11-18 DIAGNOSIS — Z91199 Patient's noncompliance with other medical treatment and regimen due to unspecified reason: Secondary | ICD-10-CM

## 2010-11-18 DIAGNOSIS — N3281 Overactive bladder: Secondary | ICD-10-CM | POA: Insufficient documentation

## 2010-11-18 DIAGNOSIS — F411 Generalized anxiety disorder: Secondary | ICD-10-CM | POA: Diagnosis present

## 2010-11-18 DIAGNOSIS — F200 Paranoid schizophrenia: Secondary | ICD-10-CM | POA: Insufficient documentation

## 2010-11-18 DIAGNOSIS — J449 Chronic obstructive pulmonary disease, unspecified: Secondary | ICD-10-CM

## 2010-11-18 DIAGNOSIS — E876 Hypokalemia: Secondary | ICD-10-CM | POA: Diagnosis present

## 2010-11-18 DIAGNOSIS — M1710 Unilateral primary osteoarthritis, unspecified knee: Secondary | ICD-10-CM | POA: Diagnosis present

## 2010-11-18 DIAGNOSIS — I2510 Atherosclerotic heart disease of native coronary artery without angina pectoris: Secondary | ICD-10-CM | POA: Diagnosis present

## 2010-11-18 DIAGNOSIS — R197 Diarrhea, unspecified: Secondary | ICD-10-CM | POA: Diagnosis present

## 2010-11-18 DIAGNOSIS — K219 Gastro-esophageal reflux disease without esophagitis: Secondary | ICD-10-CM | POA: Diagnosis present

## 2010-11-18 DIAGNOSIS — N318 Other neuromuscular dysfunction of bladder: Secondary | ICD-10-CM | POA: Diagnosis present

## 2010-11-18 DIAGNOSIS — R259 Unspecified abnormal involuntary movements: Secondary | ICD-10-CM | POA: Diagnosis present

## 2010-11-18 DIAGNOSIS — R109 Unspecified abdominal pain: Secondary | ICD-10-CM | POA: Diagnosis present

## 2010-11-18 DIAGNOSIS — I739 Peripheral vascular disease, unspecified: Secondary | ICD-10-CM | POA: Diagnosis present

## 2010-11-18 DIAGNOSIS — M1610 Unilateral primary osteoarthritis, unspecified hip: Secondary | ICD-10-CM | POA: Diagnosis present

## 2010-11-18 DIAGNOSIS — G8929 Other chronic pain: Secondary | ICD-10-CM | POA: Diagnosis present

## 2010-11-18 DIAGNOSIS — IMO0002 Reserved for concepts with insufficient information to code with codable children: Secondary | ICD-10-CM | POA: Diagnosis present

## 2010-11-18 DIAGNOSIS — Z7982 Long term (current) use of aspirin: Secondary | ICD-10-CM

## 2010-11-18 DIAGNOSIS — I5032 Chronic diastolic (congestive) heart failure: Secondary | ICD-10-CM | POA: Diagnosis present

## 2010-11-18 DIAGNOSIS — G4730 Sleep apnea, unspecified: Secondary | ICD-10-CM | POA: Diagnosis present

## 2010-11-18 DIAGNOSIS — F209 Schizophrenia, unspecified: Principal | ICD-10-CM | POA: Insufficient documentation

## 2010-11-18 DIAGNOSIS — R112 Nausea with vomiting, unspecified: Secondary | ICD-10-CM | POA: Diagnosis present

## 2010-11-18 DIAGNOSIS — E7800 Pure hypercholesterolemia, unspecified: Secondary | ICD-10-CM | POA: Diagnosis present

## 2010-11-18 DIAGNOSIS — I10 Essential (primary) hypertension: Secondary | ICD-10-CM | POA: Diagnosis present

## 2010-11-18 LAB — URINALYSIS, ROUTINE W REFLEX MICROSCOPIC
Bilirubin Urine: NEGATIVE
Glucose, UA: NEGATIVE mg/dL
Ketones, ur: NEGATIVE mg/dL
Leukocytes, UA: NEGATIVE
Nitrite: NEGATIVE
Protein, ur: 30 mg/dL — AB
Specific Gravity, Urine: 1.01 (ref 1.005–1.030)
Urobilinogen, UA: 0.2 mg/dL (ref 0.0–1.0)
pH: 6.5 (ref 5.0–8.0)

## 2010-11-18 LAB — COMPREHENSIVE METABOLIC PANEL
ALT: 6 U/L (ref 0–35)
AST: 12 U/L (ref 0–37)
Alkaline Phosphatase: 62 U/L (ref 39–117)
CO2: 25 mEq/L (ref 19–32)
Chloride: 95 mEq/L — ABNORMAL LOW (ref 96–112)
GFR calc non Af Amer: 54 mL/min — ABNORMAL LOW (ref >90)
Sodium: 133 mEq/L — ABNORMAL LOW (ref 135–145)
Total Bilirubin: 0.3 mg/dL (ref 0.3–1.2)

## 2010-11-18 LAB — DIFFERENTIAL
Basophils Relative: 1 % (ref 0–1)
Eosinophils Absolute: 0.2 10*3/uL (ref 0.0–0.7)
Lymphs Abs: 3.2 10*3/uL (ref 0.7–4.0)
Neutrophils Relative %: 62 % (ref 43–77)

## 2010-11-18 LAB — URINE MICROSCOPIC-ADD ON

## 2010-11-18 LAB — CBC
MCV: 85.9 fL (ref 78.0–100.0)
Platelets: 238 10*3/uL (ref 150–400)
RBC: 4.04 MIL/uL (ref 3.87–5.11)
WBC: 10.9 10*3/uL — ABNORMAL HIGH (ref 4.0–10.5)

## 2010-11-18 LAB — GLUCOSE, CAPILLARY
Glucose-Capillary: 150 mg/dL — ABNORMAL HIGH (ref 70–99)
Glucose-Capillary: 91 mg/dL (ref 70–99)

## 2010-11-18 MED ORDER — INSULIN ASPART 100 UNIT/ML ~~LOC~~ SOLN
0.0000 [IU] | Freq: Three times a day (TID) | SUBCUTANEOUS | Status: DC
  Administered 2010-11-19 (×2): 1 [IU] via SUBCUTANEOUS
  Filled 2010-11-18: qty 3

## 2010-11-18 MED ORDER — INSULIN ASPART 100 UNIT/ML ~~LOC~~ SOLN
0.0000 [IU] | Freq: Every day | SUBCUTANEOUS | Status: DC
  Filled 2010-11-18: qty 3

## 2010-11-19 ENCOUNTER — Inpatient Hospital Stay (HOSPITAL_COMMUNITY): Payer: Medicare Other

## 2010-11-19 DIAGNOSIS — F259 Schizoaffective disorder, unspecified: Secondary | ICD-10-CM

## 2010-11-19 DIAGNOSIS — E876 Hypokalemia: Secondary | ICD-10-CM | POA: Diagnosis present

## 2010-11-19 LAB — COMPREHENSIVE METABOLIC PANEL
AST: 11 U/L (ref 0–37)
Albumin: 3.1 g/dL — ABNORMAL LOW (ref 3.5–5.2)
Alkaline Phosphatase: 57 U/L (ref 39–117)
BUN: 5 mg/dL — ABNORMAL LOW (ref 6–23)
Chloride: 102 mEq/L (ref 96–112)
Potassium: 2.8 mEq/L — ABNORMAL LOW (ref 3.5–5.1)
Total Bilirubin: 0.3 mg/dL (ref 0.3–1.2)

## 2010-11-19 LAB — HEMOGLOBIN A1C: Mean Plasma Glucose: 103 mg/dL (ref <117)

## 2010-11-19 LAB — CBC
MCH: 29.9 pg (ref 26.0–34.0)
MCHC: 35.3 g/dL (ref 30.0–36.0)
MCV: 84.9 fL (ref 78.0–100.0)
Platelets: 235 10*3/uL (ref 150–400)
RDW: 14.3 % (ref 11.5–15.5)

## 2010-11-19 LAB — LIPID PANEL
Cholesterol: 122 mg/dL (ref 0–200)
LDL Cholesterol: 62 mg/dL (ref 0–99)
VLDL: 15 mg/dL (ref 0–40)

## 2010-11-19 LAB — GLUCOSE, CAPILLARY
Glucose-Capillary: 93 mg/dL (ref 70–99)
Glucose-Capillary: 82 mg/dL (ref 70–99)

## 2010-11-19 MED ORDER — TEMAZEPAM 15 MG PO CAPS
15.0000 mg | ORAL_CAPSULE | Freq: Every day | ORAL | Status: DC
  Administered 2010-11-19: 15 mg via ORAL

## 2010-11-19 MED ORDER — METOPROLOL TARTRATE 12.5 MG HALF TABLET
12.5000 mg | ORAL_TABLET | Freq: Two times a day (BID) | ORAL | Status: DC
  Administered 2010-11-19 – 2010-11-20 (×3): 12.5 mg via ORAL
  Filled 2010-11-19 (×6): qty 1

## 2010-11-19 MED ORDER — ACETAMINOPHEN 325 MG PO TABS
650.0000 mg | ORAL_TABLET | ORAL | Status: DC | PRN
  Administered 2010-11-19 – 2010-11-20 (×4): 650 mg via ORAL
  Filled 2010-11-19: qty 2

## 2010-11-19 MED ORDER — OLANZAPINE 5 MG PO TABS
5.0000 mg | ORAL_TABLET | Freq: Every day | ORAL | Status: DC
  Administered 2010-11-19 – 2010-11-20 (×2): 5 mg via ORAL
  Filled 2010-11-19 (×4): qty 1

## 2010-11-19 MED ORDER — OLANZAPINE 5 MG PO TABS
15.0000 mg | ORAL_TABLET | Freq: Every day | ORAL | Status: DC
  Administered 2010-11-19: 15 mg via ORAL
  Filled 2010-11-19: qty 3
  Filled 2010-11-19: qty 2
  Filled 2010-11-19: qty 3

## 2010-11-19 MED ORDER — PANTOPRAZOLE SODIUM 40 MG PO TBEC
40.0000 mg | DELAYED_RELEASE_TABLET | Freq: Every day | ORAL | Status: DC
  Administered 2010-11-19 – 2010-11-20 (×2): 40 mg via ORAL
  Filled 2010-11-19 (×3): qty 1

## 2010-11-19 MED ORDER — ONDANSETRON HCL 4 MG PO TABS: 4.0000 mg | ORAL_TABLET | Freq: Four times a day (QID) | ORAL | Status: DC | PRN

## 2010-11-19 MED ORDER — DIVALPROEX SODIUM ER 500 MG PO TB24
500.0000 mg | ORAL_TABLET | Freq: Every day | ORAL | Status: DC
  Administered 2010-11-19 – 2010-11-20 (×2): 500 mg via ORAL
  Filled 2010-11-19 (×3): qty 1

## 2010-11-19 MED ORDER — NITROGLYCERIN 0.4 MG SL SUBL: 0.4000 mg | SUBLINGUAL_TABLET | SUBLINGUAL | Status: DC | PRN

## 2010-11-19 MED ORDER — ASPIRIN EC 81 MG PO TBEC
81.0000 mg | DELAYED_RELEASE_TABLET | Freq: Every day | ORAL | Status: DC
  Filled 2010-11-19: qty 1

## 2010-11-19 MED ORDER — ACETAMINOPHEN 325 MG PO TABS
ORAL_TABLET | ORAL | Status: AC
  Administered 2010-11-19: 650 mg via ORAL
  Filled 2010-11-19: qty 2

## 2010-11-19 MED ORDER — ASPIRIN EC 81 MG PO TBEC
81.0000 mg | DELAYED_RELEASE_TABLET | Freq: Every day | ORAL | Status: DC
  Administered 2010-11-19 – 2010-11-20 (×2): 81 mg via ORAL
  Filled 2010-11-19 (×4): qty 1

## 2010-11-19 MED ORDER — POTASSIUM CHLORIDE CRYS ER 20 MEQ PO TBCR
40.0000 meq | EXTENDED_RELEASE_TABLET | Freq: Every day | ORAL | Status: DC
  Administered 2010-11-19 – 2010-11-20 (×2): 40 meq via ORAL
  Filled 2010-11-19 (×3): qty 2

## 2010-11-19 MED ORDER — ALLOPURINOL 100 MG PO TABS
100.0000 mg | ORAL_TABLET | Freq: Every day | ORAL | Status: DC
  Administered 2010-11-19 – 2010-11-20 (×2): 100 mg via ORAL
  Filled 2010-11-19 (×3): qty 1

## 2010-11-19 MED ORDER — TRAZODONE HCL 100 MG PO TABS
300.0000 mg | ORAL_TABLET | Freq: Every day | ORAL | Status: DC
  Administered 2010-11-19: 300 mg via ORAL
  Filled 2010-11-19: qty 2
  Filled 2010-11-19 (×2): qty 3

## 2010-11-19 MED ORDER — ROSUVASTATIN CALCIUM 5 MG PO TABS
5.0000 mg | ORAL_TABLET | Freq: Every day | ORAL | Status: DC
  Administered 2010-11-19 – 2010-11-20 (×2): 5 mg via ORAL
  Filled 2010-11-19 (×3): qty 1

## 2010-11-19 MED ORDER — ACETAMINOPHEN 325 MG PO TABS
ORAL_TABLET | ORAL | Status: AC
  Administered 2010-11-19: 650 mg
  Filled 2010-11-19: qty 2

## 2010-11-19 MED ORDER — ACETAMINOPHEN 325 MG PO TABS
ORAL_TABLET | ORAL | Status: AC
  Filled 2010-11-19: qty 2

## 2010-11-19 MED ORDER — SODIUM CHLORIDE 0.9 % IV SOLN
INTRAVENOUS | Status: DC
  Administered 2010-11-19: 22:00:00 via INTRAVENOUS

## 2010-11-19 MED ORDER — TEMAZEPAM 15 MG PO CAPS
ORAL_CAPSULE | ORAL | Status: AC
  Administered 2010-11-19: 15 mg via ORAL
  Filled 2010-11-19: qty 1

## 2010-11-19 MED ORDER — POTASSIUM CHLORIDE 20 MEQ PO PACK
40.0000 meq | PACK | Freq: Every day | ORAL | Status: DC
  Administered 2010-11-19: 40 meq via ORAL
  Filled 2010-11-19 (×2): qty 2

## 2010-11-19 MED ORDER — ENOXAPARIN SODIUM 40 MG/0.4ML ~~LOC~~ SOLN
40.0000 mg | Freq: Every day | SUBCUTANEOUS | Status: DC
  Administered 2010-11-19 – 2010-11-20 (×2): 40 mg via SUBCUTANEOUS
  Filled 2010-11-19 (×3): qty 0.4

## 2010-11-19 MED ORDER — ONDANSETRON HCL 4 MG/2ML IJ SOLN: 4.0000 mg | Freq: Four times a day (QID) | INTRAMUSCULAR | Status: DC | PRN

## 2010-11-19 NOTE — Progress Notes (Signed)
Subjective:  multiple complains seems unrealistic ,? Fracture of her right femur and then complains , then complains of persistent diarrhea and then constipation . Unable to get specific complains , wants pain medications/ back pain, disorganized ideas.  Objective: Weight change:   Intake/Output Summary (Last 24 hours) at 11/19/10 1609 Last data filed at 11/19/10 0300  Gross per 24 hour  Intake      0 ml  Output      0 ml  Net      0 ml   Blood pressure 160/71, pulse 76, temperature 98.9 F (37.2 C), temperature source Oral, resp. rate 20, height 5\' 6"  (1.676 m), weight 70.307 kg (155 lb), SpO2 99.00%.   Physical Exam: General: Alert and awake oriented x3 not in any acute distress. HEENT: anicteric sclera, pupils reactive to light and accommodation CVS: S1-S2 clear no murmur rubs or gallops, some dyskinesia  Chest: clear to auscultation bilaterally, no wheezing rales or rhonchi Abdomen: soft nontender, nondistended, normal bowel sounds, no organomegaly Extremities: no cyanosis, clubbing or edema noted bilaterally Neuro: Cranial nerves II-XII intact, no focal neurological deficits  Lab Results:  Micro Results: No results found for this or any previous visit (from the past 240 hour(s)).  Studies/Results: Dg Chest 2 View  10/20/2010  *RADIOLOGY REPORT*  Clinical Data: Left-sided chest pain.  Short of breath.  CHEST - 2 VIEW  Comparison: 09/06/2010.  Findings: Dual lead left subclavian cardiac pacemaker.  Apical lordotic projection.  Aortic arch atherosclerotic calcification. No airspace disease.  No effusion.  Mild left basilar atelectasis.  IMPRESSION: No acute cardiopulmonary disease.  Original Report Authenticated By: Dereck Ligas, M.D.   Dg Abd Acute W/chest  11/18/2010  *RADIOLOGY REPORT*  Clinical Data: Abdominal pain.  ACUTE ABDOMEN SERIES (ABDOMEN 2 VIEW & CHEST 1 VIEW)  Comparison: CT 04/11/2010  Findings: Hyperinflation of the lungs.  Left pacer is in place with leads in  the right atrium and right ventricle.  No pneumothorax. No focal opacities or effusions.  No acute bony abnormality.  IMPRESSION: Hyperinflation.  No active disease.  Original Report Authenticated By: Raelyn Number, M.D.    Medications: Scheduled Meds:   . acetaminophen      . acetaminophen      . allopurinol  100 mg Oral Daily  . aspirin EC  81 mg Oral Daily  . divalproex  500 mg Oral Daily  . enoxaparin (LOVENOX) injection  40 mg Subcutaneous Daily  . insulin aspart  0-5 Units Subcutaneous QHS  . insulin aspart  0-9 Units Subcutaneous TID WC  . metoprolol tartrate  12.5 mg Oral BID  . OLANZapine  15 mg Oral QHS  . OLANZapine  5 mg Oral Daily  . pantoprazole  40 mg Oral Q1200  . rosuvastatin  5 mg Oral q1800  . temazepam  15 mg Oral QHS  . traZODone  300 mg Oral QHS  . DISCONTD: aspirin EC  81 mg Oral Daily   Continuous Infusions:   . sodium chloride     PRN Meds:.acetaminophen, nitroGLYCERIN, ondansetron (ZOFRAN) IV, ondansetron  Assessment/Plan: Active Problems: 1- disorganized idea with history of schizophrenia , we ned to rule out  Stroke , ct head is logic here , DW the son , he felt her psy medication need to be further organized.compalin of pain on her right leg likely related to sever degenerative diseases of her hip joint 2- no vomiting or diarrhea reported  DW psych  continue  Zyprexa and Depakote for now. Check  urine analysis and culture. 3- hypokalemia : replacement 4- abdominal pain seems soft and non tender, lipase norma, LFT also WNL. Per previous admission she felt she was pregnant , currently will observe. Has previous work up including ct abd and pelvis was negative. 5- SPPM  LOS: 1 day   Yareth Macdonnell I. 11/19/2010, 4:09 PM

## 2010-11-19 NOTE — H&P (Signed)
NAME:  Abigail Wallace, [place]             ACCOUNT NO.:  [id number]  MEDICAL RECORD NO.:  [id number]  LOCATION:  [id number]                         FACILITY:  [facility]  PHYSICIAN:  Erma Pinto, MD       DATE OF BIRTH:  1945-06-18  DATE OF ADMISSION:  11/18/2010 DATE OF DISCHARGE:                             HISTORY & PHYSICAL   She does not have a primary care physician.  She does have a psychiatrist, but has not been seeing him, Dr. Norma Fredrickson, M.D., and has been seen by GYN because she recently had vaginal hysterectomy that was on August 21, 2010, and had a catheterization on September 09, 2010, and that was because of abdominal pain and atypical changes on the EKG, but the catheterization showed minor cardiac disease.  CHIEF COMPLAINT:  Abdominal pain, nausea, vomiting, and diarrhea.  HISTORY OF PRESENT ILLNESS:  Ms. Abigail Argomaniz. Berdine Wallace is a 65 year old Caucasian female who has a history of schizophrenia and is noncompliant with her medicines.  She has been complaining of abdominal pain, nausea, vomiting, and diarrhea x3 months and has been hospitalized for this on several occasions over the last few months.  She had a catheterizations because of some EKG changes, but it only showed minor cardiac coronary disease.  No significant stenosis.  Patient according to the son is noncompliant with her psych medicine and some of her symptomatology may be secondary to that.  Here in the ER, patient's exam, vitals, and labs within normal limits except for the potassium which was low.  The remainder of her labs were okay except for a glucose of 133.  She does have a history of non-insulin-dependent diabetes.  PAST MEDICAL HISTORY:  Significant for, 1. Hypertension. 2. Gastroesophageal reflux disease. 3. Gout. 4. Schizophrenia. 5. Peripheral vascular disease. 6. Coronary artery disease which is minor and she has had a pacemaker     for complete heart block. 7. Cholecystectomy. 8. Tubal ligation. 9.  Hysterectomy, vaginal that was in August. 10.She also had a catheterization in August which showed minor     coronary artery disease.  No significant stenosis and they felt     that her abdominal pain was not secondary to coronary disease.  SOCIAL HISTORY:  She lives with her son.  She smokes approximately a pack per day.  According to the son, he has been trying to have her stop, but she occasionally gets cigarette from people who live in the neighborhood or people who give her cigarette, but she is not smoking a pack per day.  She smokes on occasion.  No history of alcohol or drug use.  FAMILY HISTORY:  Noncontributory.  ALLERGIES:  She has allergies to, 1. COGENTIN. 2. HALDOL. 3. ABILIFY. 4. LATEX.  MEDICATIONS:  I do not have a list of her medications.  We are attempting to get med reconciliation.  She apparently takes 2 blood pressure medicines.  REVIEW OF SYSTEMS:  Unobtainable because of her schizophrenia.  If we go through, she has a positive review of systems.  PHYSICAL EXAMINATION:  VITAL SIGNS:  Her temperature is 97, pulse is 100, respirations 20, blood pressure 134/76. GENERAL:  She is well developed, does not appear  to be any acute distress.  She has an atypical affect. HEENT:  Examination of her head is atraumatic, normocephalic.  Pupils equal, round, reactive to light.  Discs sharp.  Extraocular muscle range of motion is full. NECK:  Supple without jugular venous distention, thyromegaly, or thyroid mass. CHEST:  Clear to auscultation and percussion. BREASTS/PELVIC/RECTAL:  Deferred at the patient's request. LUNGS:  Clear to auscultation. HEART:  Regular rate and rhythm, it is a paced rhythm. ABDOMEN:  Soft, nontender with normoactive bowel sounds.  No hepatomegaly.  No splenomegaly.  No palpable mass. EXTREMITIES: There is no clubbing, cyanosis, or edema. SKIN:  No rash. NEUROLOGIC:  She is awake, alert, oriented x3.  Cranial nerves II through XII intact.   Motor is 5/5 in upper and lower extremities. Sensory intact to fine touch.  LABORATORIES:  Urinalysis is negative on dipstick.  Her comprehensive metabolic panel, sodium is 133, normal is 135 to 145, potassium 2.5, chloride is low at 95, normal is 96 to 112, CO2 is 25, glucose is high at 133.  Blood urea nitrogen is 7, creatinine is 1.05, calcium is 10.3. Remainder of her comprehensive metabolic panel is normal except for a low GFR.  Her creatinine is 1.05, normal is 0.5 to 1.1.  Lipase is 41. White count is 10900, hemoglobin 12.1, hematocrit 34.7 with a normal differential.  IMPRESSION: 1. Hypokalemia with a potassium at 2.5.  She will need a replacement     and monitoring as well as telemetry since her potassium is     clinically low. 2. Abdominal pain, nausea, vomiting, diarrhea, which has been going on     for 3 months.  On exam, her abdomen is benign and this is a chronic     problem which may be related more to her compliance with her     schizophrenic medications.  Also, the patient has not had any     nausea, vomiting, or diarrhea here in the emergency room. 3. Schizophrenia, noncompliant.  Patient will need social services and     a psych consult or behavioral health consult.  Those will be     ordered.  PLAN:  To obtain her med reconciliation sheet and restart her on her medications, replace her potassium, monitor her electrolytes, hydrate her, put her on clear liquids, to be advanced to a diabetic diet as tolerated.  Because of her history of diabetes, do Accu-Cheks and sensitive sliding scale since she is not on insulin on a regular basis and continue on her diabetic medicine.  Further evaluation and treatment as indicated by hospital course.  Patient is being admitted to [facility] team 7.     Erma Pinto, MD     ML/MEDQ  D:  11/18/2010  T:  11/19/2010  Job:  [id number]  Electronically Signed by Erma Pinto  on 11/19/2010 07:35:19 PM

## 2010-11-19 NOTE — Consult Note (Signed)
Patten 65 y.o. CF is in isolation for stated repeated admissions for diarrhea.  She has an MRI of abdomen that suggests diverticulitis.  She is sitting in her bed with Right knee hyperflexed.  She is unable despite repeated dialogue to provide a definitive reason why she had been involuntarily committed to [facility] or why she "stopped taking my Clozaril 1 month ago". [record shows a blood Clozaril level of 1236]  She says the doctors told her to stop taking it.  She denies any diarrhea today.  She claims her admissions was because she was sick for many things, listing heart disease with a pacemaker, diabetes, multiple other medical conditions-listed in the chart.   She also has a noted hypokalemia 2.6 mEq/L. She says her son lives with her, takes care of the apartment, helps her with ADLs, claims he does not know how to check her blood glucose level. She says she has an appointment with a psychiatrist in December.  She cannot say the person's name, denies her son has the phone number and insists her son will take her there.   MENTAL STATUS EXAMINATION:  [name] looks her stated age.  She is edentulous and appears to have a large, {distended?] abdomen.  She sits calmly with alert attention and full affect.  She expressed occasional distress that she is not given 'her medicine'.   She speaks with pride about her 3 children, mentions 1 is deceased, grandchildren and great grandchildren.  She claims they provide for her.  When asked about her involuntary commitment, she offers no reason and has no idea how long she stayed.  [Later she tells Education officer, [facility]]  She denies auditory hallucinations, visual hallucinations, suicidal ideation or homicidal ideation.  She expresses her wish to go home; not to a psychiatric hospital.  Her speech is normal rate with some perseveration.  Her insight is poor and her ability to engage is logical cause/effect is poor.  She has circular logic with  intermittent facts provided.  She can comprehend but fund of knowledge is limited.  Her judgment is poor. RECOMMENDATION:  [name] is unable to know her psychiatric diagnoses.  She apparently has been taking her secondary generation antipsychotic Clozaril 1236 level but also has an order for Zyprexa 5 mg.  She has AIMS 1+ for tongue movement.  If this TD has been present chronically, it is questionable that an anticholinergic, benztropine, etc.,  medication would offer any use.  It will be most helpful to determine what medications were given while ine the psychiatric hospital and what was given on discharge.  Another safety aspect for psychiatric treatment as well as monitoring and treating her complex medical conditions would be to interview the son and determine his availability and willingness to provide supervision of her medications.  If he is not, someone needs to supervise home care. This patient needs a specific appointment with an outpatient psychiatrist and follow- up on filling pharmacy prescriptions for Clozaril or Zyprexa, which ever is the preferred; tolorated medication.  Either one will be very effective but both carry precautions to monitor for metabolic syndrome in a patient with serious cardiac disease.  Based upon her degree of confusion, she is at risk for remission of psychiatric symptoms.  Concern is raised with potassium 2.6 mEq/L Will page Dr. Eugenio Hoes  [phone]/Dr. Farrel Demark   AXIS I   Schizophrenia  AXIS II  Deferred AXIS III Cardiac Disease/pacemaker, history of recurrent diarrhea, diabetes-Multi-system Disease  AXIS IV Home care, medical management AXIS V  GAF 50

## 2010-11-20 ENCOUNTER — Ambulatory Visit (HOSPITAL_COMMUNITY): Payer: Medicare Other | Admitting: Physician Assistant

## 2010-11-20 LAB — BASIC METABOLIC PANEL
CO2: 23 mEq/L (ref 19–32)
Calcium: 9.1 mg/dL (ref 8.4–10.5)
Chloride: 109 mEq/L (ref 96–112)
Glucose, Bld: 104 mg/dL — ABNORMAL HIGH (ref 70–99)
Sodium: 142 mEq/L (ref 135–145)

## 2010-11-20 LAB — GLUCOSE, CAPILLARY: Glucose-Capillary: 80 mg/dL (ref 70–99)

## 2010-11-20 MED ORDER — ACETAMINOPHEN 325 MG PO TABS
ORAL_TABLET | ORAL | Status: AC
  Administered 2010-11-20: 650 mg via ORAL
  Filled 2010-11-20: qty 2

## 2010-11-20 NOTE — Progress Notes (Signed)
Physical Therapy Evaluation Patient Details Name: Abigail Wallace MRN: [id number] DOB: [birth year] Today's Date: 11/20/2010 [id number] Problem List:  Patient Active Problem List  Diagnoses  . HYPERCHOLESTEROLEMIA  . SCHIZOPHRENIA  . CAD  . SICK SINUS SYNDROME  . Chronic diastolic heart failure  . COPD  . Microscopic hematuria  . OSTEOARTHRITIS, HIP, RIGHT  . RESTING TREMOR  . LEG EDEMA, BILATERAL  . Overactive bladder  . Vaginal prolapse  . Sleep apnea  . Abdominal pain  . Osteoarthritis of right knee  . Pacemaker  . Vulvovaginitis  . Elevated blood pressure  . Hypokalemia    Past Medical History:  Past Medical History  Diagnosis Date  . Cystitis     Frequent episodes.   . Gout   . Coronary artery disease   . COPD (chronic obstructive pulmonary disease)   . GERD (gastroesophageal reflux disease)   . Mental disorder    Past Surgical History:  Past Surgical History  Procedure Date  . Pacemaker placement 09/28/2005    2/2 SSS?  Marland Kitchen Cholecystectomy   . Tubal ligation   . Cardiac catheterization 2007    Non-critical.   . Persantine stress test 05/01/2010    EF 66%. Normal LV sys fx. Unchanged from previous studies.   . Insert / replace / remove pacemaker 2007    [place]    PT Assessment/Plan/Recommendation PT Assessment Clinical Impression Statement: Pt admitted with multiple complaints,diarrhea, noncompliance with medication, decreased safety and inability to care for self.pt. can benefit from pT to improve safety, mobility to DC to next venue PT Recommendation/Assessment: Patient will need skilled PT in the acute care venue PT Problem List: Decreased activity tolerance;Decreased mobility;Decreased cognition;Decreased knowledge of use of DME;Decreased safety awareness Barriers to Discharge: Decreased caregiver support (per son, pt alone for short periods) PT Therapy Diagnosis : Abnormality of gait PT Plan PT Frequency: Min 3X/week PT  Treatment/Interventions: DME instruction;Gait training;Functional mobility training;Patient/family education PT Recommendation Recommendations for Other Services: OT consult Follow Up Recommendations: 24 hour supervision/assistance;Skilled nursing facility Equipment Recommended: None recommended by PT PT Goals  Acute Rehab PT Goals PT Goal Formulation: With patient/family Time For Goal Achievement: 2 weeks Pt will go Supine/Side to Sit: with supervision PT Goal: Supine/Side to Sit - Progress: Progressing toward goal Pt will go Sit to Supine/Side: with supervision PT Goal: Sit to Supine/Side - Progress: Progressing toward goal Pt will Transfer Sit to Stand/Stand to Sit: with supervision PT Transfer Goal: Sit to Stand/Stand to Sit - Progress: Progressing toward goal  PT Evaluation Precautions/Restrictions  Precautions Precautions: Fall Prior Functioning  Home Living Lives With: Abigail Wallace Help From: Family;Personal care attendant Type of Home: House Home Layout: One level Prior Function Level of Independence: Independent with transfers;Requires assistive device for independence Driving: No Cognition Cognition Arousal/Alertness: Awake/alert Overall Cognitive Status: Impaired History of Cognitive Impairment: Decline in baseline functioning (per son he his concerned for pt taking her meds) Orientation Level: Oriented to person Safety/Judgement: Decreased awareness of safety precautions;Decreased safety judgement for tasks assessed Decreased Safety/Judgement: Impulsive;Decreased awareness of need for assistance Awareness of Deficits: Decreased awareness of deficits Problem Solving: Requires assistance for problem solving Sensation/Coordination Sensation Light Touch: Appears Intact Coordination Gross Motor Movements are Fluid and Coordinated: Yes Fine Motor Movements are Fluid and Coordinated: No (pt has h/o polio with RLE deficits) Extremity Assessment RLE Strength RLE  Overall Strength: Deficits (RLE is smaller in circumference and shortened with aatrophy) LLE Assessment LLE Assessment: Within Functional Limits Mobility (including Balance) Pt incontinent  of bladder prior to getting out of  bed Bed Mobility Bed Mobility: Yes Supine to Sit: 4: Min assist;HOB flat Supine to Sit Details (indicate cue type and reason): pt did not want assistance Transfers Transfers: Yes Sit to Stand: 3: Mod assist;From bed;From chair/3-in-1 (pt did not want physical assistance but PT did assist pt) Sit to Stand Details (indicate cue type and reason): encouraged pt to allow PT to assist her Stand to Sit: 4: Min assist;To chair/3-in-1 Ambulation/Gait Ambulation/Gait: Yes Ambulation/Gait Assistance: 3: Mod assist Ambulation/Gait Assistance Details (indicate cue type and reason): VC for posture as pt. leansover with RW out in Front, maybe due to polio deficits ofRLE Ambulation Distance (Feet): 80 Feet Assistive device: Rolling walker Gait Pattern: Step-to pattern;Decreased step length - right;Decreased stance time - right;Decreased hip/knee flexion - right;Decreased weight shift to right  Posture/Postural Control Posture/Postural Control: No significant limitations Exercise    End of Session PT - End of Session Activity Tolerance: Patient tolerated treatment well Patient left: in chair;with call bell in reach;with family/visitor present General Behavior During Session:  (appeared depressed when son soke about pts. dcreased cogniti) Cognition: Impaired, at baseline  Claretha Cooper 11/20/2010, 4:43 PM

## 2010-11-20 NOTE — Discharge Summary (Signed)
Physician Discharge Summary  Patient ID: Abigail Wallace MRN: [id number] DOB/AGE: 67-Feb-1947 67 y.o.  Admit date: 11/18/2010 Discharge date: 11/20/2010  Primary Care Physician:  [facility], Levada Dy, MD, MD Patient has a new PCP, who she is scheduled to see in December 1012.  Discharge Diagnoses:    Patient Active Problem List  Diagnoses  . HYPERCHOLESTEROLEMIA  . SCHIZOPHRENIA  . CAD  . SICK SINUS SYNDROME  . Chronic diastolic heart failure  . COPD  . Microscopic hematuria  . OSTEOARTHRITIS, HIP, RIGHT  . RESTING TREMOR  . LEG EDEMA, BILATERAL  . Overactive bladder  . Vaginal prolapse  . Sleep apnea  . Abdominal pain  . Osteoarthritis of right knee  . Pacemaker  . Vulvovaginitis  . Elevated blood pressure  . Hypokalemia    Current Discharge Medication List    CONTINUE these medications which have NOT CHANGED   Details  allopurinol (ZYLOPRIM) 100 MG tablet Take 100 mg by mouth daily.     aspirin EC 81 MG tablet Take 81 mg by mouth daily.      lisinopril-hydrochlorothiazide (PRINZIDE,ZESTORETIC) 20-12.5 MG per tablet Take 1 tablet by mouth Daily.    metoprolol tartrate (LOPRESSOR) 25 MG tablet Take 12.5 mg by mouth 2 (two) times daily.     rosuvastatin (CRESTOR) 5 MG tablet Take 5 mg by mouth every evening.     albuterol (PROVENTIL HFA) 108 (90 BASE) MCG/ACT inhaler Inhale 2 puffs into the lungs every 6 (six) hours as needed for wheezing. Qty: 1 Inhaler, Refills: 6   Associated Diagnoses: Chronic airway obstruction, not elsewhere classified    Diapers & Supplies MISC 1 Units by Does not apply route 5 (five) times daily as needed. Qty: 150 each, Refills: 12    divalproex (DEPAKOTE) 500 MG 24 hr tablet Take 500 mg by mouth daily.     Nitroglycerin (NITROSTAT SL) Place 1 tablet under the tongue every 5 (five) minutes x 3 doses as needed. For chest    OLANZapine (ZYPREXA) 15 MG tablet Take 5-15 mg by mouth as directed. Pt takes 1 in the morning and 3 at bedtime.      pantoprazole (PROTONIX) 40 MG tablet Take 40 mg by mouth daily.      temazepam (RESTORIL) 15 MG capsule Take 15 mg by mouth at bedtime.      traZODone (DESYREL) 100 MG tablet Take 300 mg by mouth at bedtime.          Disposition and Follow-up:  Follow up with primary MD, and Psychiatrist.  Consults:  psychiatry  Dr. Evern Bio, Psychiatrist   Significant Diagnostic Studies:  Dg Abd Acute W/chest  11/18/2010  *RADIOLOGY REPORT*  Clinical Data: Abdominal pain.  ACUTE ABDOMEN SERIES (ABDOMEN 2 VIEW & CHEST 1 VIEW)  Comparison: CT 04/11/2010  Findings: Hyperinflation of the lungs.  Left pacer is in place with leads in the right atrium and right ventricle.  No pneumothorax. No focal opacities or effusions.  No acute bony abnormality.  IMPRESSION: Hyperinflation.  No active disease.  Original Report Authenticated By: Raelyn Number, M.D.    Brief H and P: For complete details, refer to admission H and P, however,  in brief, this is a 65 year old female with a history of schizophrenia and noncompliance with medication, presenting with abdominal pain, nausea, vomiting, and diarrhea x3 months. Patient has been hospitalized as been hospitalized for  Similar symptoms on several occasions over the last few months. She was admitted for further evaluation, investigation and management.  On 11/20/2010, she had no new complaints, felt considerably better and was keen to be discharged.  Physical Exam: On 11/20/2010. General:   Alert, communicative, fully oriented, not short of breath at rest.  HEENT:  No clinical pallor, no jaundice, no conjunctival injection or discharge. NECK:  Supple, JVP not seen, no carotid bruits, no palpable lymphadenopathy, no palpable goiter. CHEST:  Clinically clear to auscultation, no wheezes, no crackles. HEART:  Sounds 1 and 2 heard, normal, regular, no murmurs. ABDOMEN:  Full, soft, non-tender, no palpable organomegaly, no palpable masses, normal bowel sounds. LOWER  EXTREMITIES:  No pitting edema, palpable peripheral pulses. MUSCULOSKELETAL SYSTEM:  Generalized osteoarthritic changes, otherwise, normal. CENTRAL NERVOUS SYSTEM:  No focal neurologic deficit on gross examination.  Hospital Course:  Active Problems: 1.  SCHIZOPHRENIA: Patient was evaluated by psychiatrist, Dr Evern Bio, who has recommended continue preadmission psychotropics, and continued outpatient psychiatric follow up.  2. COPD: Stable . No exacerbation was documented during this hospitalization.  3. Overactive bladder: Not problematic.  4. Vomiting/Diarrhea: Asymptomatic, from this viewpoint. 5. Abdominal pain: Chronic, s/p previous negative work up. No acute findings, during this hospitalization.   Time spent on Discharge: 45 mins.  Signed: Justinn Welter,CHRISTOPHER 11/20/2010, 5:50 PM

## 2010-11-20 NOTE — Progress Notes (Signed)
Discharge instructions given to pt/family, verbalized understanding. Left the unit in stable condition. 

## 2010-11-22 LAB — OVA AND PARASITE EXAMINATION

## 2010-11-23 ENCOUNTER — Encounter: Payer: Self-pay | Admitting: Family Medicine

## 2010-11-23 ENCOUNTER — Emergency Department (HOSPITAL_COMMUNITY): Payer: Medicare Other

## 2010-11-23 ENCOUNTER — Emergency Department (HOSPITAL_COMMUNITY)
Admission: EM | Admit: 2010-11-23 | Discharge: 2010-11-24 | Disposition: A | Discharge status: Other Acute Inpatient Hospital | Payer: Medicare Other | Attending: Emergency Medicine | Admitting: Emergency Medicine

## 2010-11-23 ENCOUNTER — Encounter (HOSPITAL_COMMUNITY): Payer: Self-pay | Admitting: Family Medicine

## 2010-11-23 DIAGNOSIS — F22 Delusional disorders: Secondary | ICD-10-CM | POA: Insufficient documentation

## 2010-11-23 DIAGNOSIS — F209 Schizophrenia, unspecified: Secondary | ICD-10-CM

## 2010-11-23 DIAGNOSIS — J4489 Other specified chronic obstructive pulmonary disease: Secondary | ICD-10-CM | POA: Insufficient documentation

## 2010-11-23 DIAGNOSIS — I2510 Atherosclerotic heart disease of native coronary artery without angina pectoris: Secondary | ICD-10-CM | POA: Insufficient documentation

## 2010-11-23 DIAGNOSIS — J449 Chronic obstructive pulmonary disease, unspecified: Secondary | ICD-10-CM | POA: Insufficient documentation

## 2010-11-23 LAB — CBC
MCH: 29.9 pg (ref 26.0–34.0)
MCHC: 34.4 g/dL (ref 30.0–36.0)
MCV: 87.1 fL (ref 78.0–100.0)
Platelets: 242 10*3/uL (ref 150–400)
RDW: 14.4 % (ref 11.5–15.5)

## 2010-11-23 LAB — DIFFERENTIAL
Basophils Absolute: 0.1 10*3/uL (ref 0.0–0.1)
Basophils Relative: 1 % (ref 0–1)
Eosinophils Absolute: 0.3 10*3/uL (ref 0.0–0.7)
Eosinophils Relative: 3 % (ref 0–5)

## 2010-11-23 LAB — URINALYSIS, ROUTINE W REFLEX MICROSCOPIC
Bilirubin Urine: NEGATIVE
Glucose, UA: NEGATIVE mg/dL
Ketones, ur: NEGATIVE mg/dL
Nitrite: NEGATIVE
pH: 7.5 (ref 5.0–8.0)

## 2010-11-23 LAB — COMPREHENSIVE METABOLIC PANEL
ALT: 10 U/L (ref 0–35)
Albumin: 3.4 g/dL — ABNORMAL LOW (ref 3.5–5.2)
Calcium: 10 mg/dL (ref 8.4–10.5)
GFR calc Af Amer: >90 mL/min (ref >90)
Glucose, Bld: 88 mg/dL (ref 70–99)
Sodium: 136 mEq/L (ref 135–145)
Total Protein: 6.7 g/dL (ref 6.0–8.3)

## 2010-11-23 LAB — RAPID URINE DRUG SCREEN, HOSP PERFORMED
Amphetamines: NOT DETECTED
Barbiturates: NOT DETECTED
Benzodiazepines: NOT DETECTED
Cocaine: NOT DETECTED

## 2010-11-23 LAB — URINE MICROSCOPIC-ADD ON

## 2010-11-23 MED ORDER — LORAZEPAM 1 MG PO TABS
2.0000 mg | ORAL_TABLET | Freq: Once | ORAL | Status: AC
  Administered 2010-11-23: 2 mg via ORAL
  Filled 2010-11-23: qty 2

## 2010-11-23 MED ORDER — LISINOPRIL-HYDROCHLOROTHIAZIDE 20-12.5 MG PO TABS: 1.0000 | ORAL_TABLET | Freq: Every day | ORAL | Status: DC

## 2010-11-23 MED ORDER — TRAZODONE HCL 100 MG PO TABS
100.0000 mg | ORAL_TABLET | Freq: Every day | ORAL | Status: DC
Start: 2010-11-24 — End: 2010-11-24
  Administered 2010-11-24: 100 mg via ORAL
  Filled 2010-11-23: qty 1

## 2010-11-23 MED ORDER — HYDROCHLOROTHIAZIDE 12.5 MG PO CAPS
12.5000 mg | ORAL_CAPSULE | Freq: Every day | ORAL | Status: DC
  Administered 2010-11-24: 12.5 mg via ORAL
  Filled 2010-11-23: qty 1

## 2010-11-23 MED ORDER — ALBUTEROL SULFATE HFA 108 (90 BASE) MCG/ACT IN AERS: 2.0000 | INHALATION_SPRAY | Freq: Four times a day (QID) | RESPIRATORY_TRACT | Status: DC | PRN

## 2010-11-23 MED ORDER — ROSUVASTATIN CALCIUM 5 MG PO TABS
5.0000 mg | ORAL_TABLET | Freq: Every evening | ORAL | Status: DC
  Administered 2010-11-24: 5 mg via ORAL
  Filled 2010-11-23: qty 1

## 2010-11-23 MED ORDER — LISINOPRIL 20 MG PO TABS
20.0000 mg | ORAL_TABLET | Freq: Every day | ORAL | Status: DC
  Administered 2010-11-24: 20 mg via ORAL
  Filled 2010-11-23: qty 1

## 2010-11-23 MED ORDER — PANTOPRAZOLE SODIUM 40 MG PO TBEC
40.0000 mg | DELAYED_RELEASE_TABLET | Freq: Every day | ORAL | Status: DC
  Administered 2010-11-24: 40 mg via ORAL
  Filled 2010-11-23: qty 1

## 2010-11-23 MED ORDER — ASPIRIN EC 81 MG PO TBEC
81.0000 mg | DELAYED_RELEASE_TABLET | Freq: Every day | ORAL | Status: DC
  Administered 2010-11-24: 81 mg via ORAL
  Filled 2010-11-23: qty 1

## 2010-11-23 MED ORDER — ALLOPURINOL 100 MG PO TABS
100.0000 mg | ORAL_TABLET | Freq: Every day | ORAL | Status: DC
  Administered 2010-11-24: 100 mg via ORAL
  Filled 2010-11-23: qty 1

## 2010-11-23 MED ORDER — TEMAZEPAM 15 MG PO CAPS
15.0000 mg | ORAL_CAPSULE | Freq: Every day | ORAL | Status: DC
  Administered 2010-11-24: 15 mg via ORAL
  Filled 2010-11-23: qty 1

## 2010-11-23 MED ORDER — OLANZAPINE 5 MG PO TABS: 5.0000 mg | ORAL_TABLET | ORAL | Status: DC

## 2010-11-23 MED ORDER — DIVALPROEX SODIUM ER 500 MG PO TB24
500.0000 mg | ORAL_TABLET | Freq: Every day | ORAL | Status: DC
  Administered 2010-11-24: 500 mg via ORAL
  Filled 2010-11-23: qty 1

## 2010-11-23 MED ORDER — METOPROLOL TARTRATE 25 MG PO TABS
12.5000 mg | ORAL_TABLET | Freq: Two times a day (BID) | ORAL | Status: DC
  Administered 2010-11-24 (×2): 12.5 mg via ORAL
  Filled 2010-11-23 (×2): qty 1

## 2010-11-23 NOTE — ED Notes (Signed)
Patient has 14 french foley inserted. Urine obtained for future test.  Patient changed into blue scrubs. Belongings were bagged, searched, wanded, labeled and locked in patient cabinet per protocol. Because of foley patient has diaper bottom instead of paper pants.

## 2010-11-23 NOTE — Discharge Planning (Signed)
ED CM spoke with [name], son.  Correction: son lives with pt but previously lived 6 hrs away.  Updated him about contact with Dr Stark Jock and ED SW.  Referred to Dr Stark Jock and ED SW, [place]  Discussed pt's compliance to medications, son's ability to care for pt and further evaluation of pt. Dr Stark Jock and ED SW made aware that son is at bedside.

## 2010-11-23 NOTE — ED Notes (Addendum)
The patient's home medication list was verified and I have ordered all appropriate medications for her from her home meds.  Charlena Cross, MD 11/23/10 2334  Patient stable overnight no acute issues. Patient has been seen by telephone and recommended inpatient care. Behavior health working on inpatient psychiatric placement  Kalman Drape, MD 11/24/10 [phone]

## 2010-11-23 NOTE — ED Notes (Signed)
Attempt x2 to call report to [place], RN to move pt to TCU.

## 2010-11-23 NOTE — Telephone Encounter (Signed)
This encounter was created in error - please disregard.

## 2010-11-23 NOTE — ED Notes (Signed)
[id number] Expected date:11/23/10<BR> Expected time:10:49 AM<BR> Means of arrival:Ambulance<BR> Comments:<BR> Psych pt

## 2010-11-23 NOTE — ED Notes (Signed)
Pt pulled out foley catheter. Pt out of bed and ambulatory down hall with walker. States she wants to go outside. Pt informed she cannot go outside and states "they will arrest you." Pt escorted back to room without difficulty. States "I want a coke. I need an abortion." NAD noted at this time. No bleeding or noticeable trauma noted from pulling out catheter.

## 2010-11-23 NOTE — ED Provider Notes (Signed)
History     CSN: [id number] Arrival date & time: 11/23/2010 11:11 AM   First MD Initiated Contact with Patient 11/23/10 1142      Chief Complaint  Patient presents with  . Psychiatric Evaluation    per ems pt from home and "said she needed to go to the hospital and be checked out". pt stated "I am in a concentration camp", "I need an abortion", "I have gangrene". ems reports pt not making coherent statements.     (Consider location/radiation/quality/duration/timing/severity/associated sxs/prior treatment) Patient is a 65 y.o. female presenting with mental health disorder.  Mental Health Problem The primary symptoms include delusions, hallucinations and bizarre behavior. The current episode started more than 2 weeks ago.  Precipitated by: unknown. She does not admit to suicidal ideas. She does not have a plan to commit suicide.    Past Medical History  Diagnosis Date  . Cystitis     Frequent episodes.   . Gout   . Coronary artery disease   . COPD (chronic obstructive pulmonary disease)   . GERD (gastroesophageal reflux disease)   . Mental disorder     Past Surgical History  Procedure Date  . Pacemaker placement 09/28/2005    2/2 SSS?  Marland Kitchen Cholecystectomy   . Tubal ligation   . Cardiac catheterization 2007    Non-critical.   . Persantine stress test 05/01/2010    EF 66%. Normal LV sys fx. Unchanged from previous studies.   . Insert / replace / remove pacemaker 2007        Family History  Problem Relation Age of Onset  . Diabetes type II Son     History  Substance Use Topics  . Smoking status: Current Everyday Smoker -- 0.2 packs/day    Types: Cigarettes  . Smokeless tobacco: Not on file  . Alcohol Use: No    OB History    Grav Para Term Preterm Abortions TAB SAB Ect Mult Living                  Review of Systems  Psychiatric/Behavioral: Positive for hallucinations.  All other systems reviewed and are negative.    Allergies  Abilify;  Benztropine mesylate; Codeine; and Haloperidol lactate  Home Medications   Current Outpatient Rx  Name Route Sig Dispense Refill  . ALBUTEROL SULFATE HFA 108 (90 BASE) MCG/ACT IN AERS Inhalation Inhale 2 puffs into the lungs every 6 (six) hours as needed.      . ALLOPURINOL 100 MG PO TABS Oral Take 100 mg by mouth daily.     . ASPIRIN EC 81 MG PO TBEC Oral Take 81 mg by mouth daily.      Marland Kitchen DIAPERS & SUPPLIES MISC Does not apply 1 Units by Does not apply route 5 (five) times daily as needed. 150 each 12  . DIVALPROEX SODIUM 500 MG PO TB24 Oral Take 500 mg by mouth daily.     Marland Kitchen LISINOPRIL-HYDROCHLOROTHIAZIDE 20-12.5 MG PO TABS Oral Take 1 tablet by mouth Daily.    Marland Kitchen METOPROLOL TARTRATE 25 MG PO TABS Oral Take 12.5 mg by mouth 2 (two) times daily.     Marland Kitchen NITROSTAT SL Sublingual Place 1 tablet under the tongue every 5 (five) minutes x 3 doses as needed. For chest    . OLANZAPINE 15 MG PO TABS Oral Take 5-15 mg by mouth as directed. Pt takes 1 in the morning and 3 at bedtime.    Marland Kitchen PANTOPRAZOLE SODIUM 40 MG PO TBEC Oral Take  40 mg by mouth daily.      Marland Kitchen ROSUVASTATIN CALCIUM 5 MG PO TABS Oral Take 5 mg by mouth every evening.     Marland Kitchen TEMAZEPAM 15 MG PO CAPS Oral Take 15 mg by mouth at bedtime.      . TRAZODONE HCL 100 MG PO TABS Oral Take 300 mg by mouth at bedtime.       BP 159/107  Pulse 70  Temp(Src) 99 F (37.2 C) (Oral)  Resp 20  SpO2 100%  Physical Exam  Constitutional: She appears well-developed and well-nourished. No distress.  HENT:  Head: Normocephalic and atraumatic.  Eyes: EOM are normal. Pupils are equal, round, and reactive to light.  Neck: Normal range of motion. Neck supple.  Cardiovascular: Normal rate and regular rhythm.  Exam reveals no friction rub.   No murmur heard. Abdominal: Soft. Bowel sounds are normal. She exhibits no distension. There is no tenderness.  Musculoskeletal: Normal range of motion. She exhibits no edema.  Neurological: She is alert. No cranial nerve  deficit. Coordination normal.  Skin: Skin is warm and dry. She is not diaphoretic.  Psychiatric:       The patient has rambling speech with flight of ideas and delusions.  States she is in a "concentration camp and needs to have her teeth pulled because they do her no good here".  Other strange statements are made as well.    ED Course  Procedures (including critical care time)   Labs Reviewed  CBC  DIFFERENTIAL  COMPREHENSIVE METABOLIC PANEL  ETHANOL  URINE RAPID DRUG SCREEN (HOSP PERFORMED)  URINALYSIS, ROUTINE W REFLEX MICROSCOPIC   No results found.   No diagnosis found.    MDM  Medical clearance is complete.  The patient is in need of inpatient psychiatric care.  Will consult act team to evaluate.        Veryl Speak, MD 11/24/10 [phone]

## 2010-11-23 NOTE — ED Notes (Signed)
CSW met with pt's son, Abigail Wallace, in order to discuss SNF/ALF placement options. Lia Hopping stated that if his mother is not placed in an inpatient psychiatric facility for treatment, he will need to place the pt in a SNF/ALF until she is stable to return home. CSW clarified that he is not the Medical or Financial POA, however Lia Hopping stated he has contacted a lawyer to get this process started. Lia Hopping acknowledged understanding of the possibility that the mother may need inpatient psychiatric treatment before entering a SNF/ALF and that if she was at a separate psychiatric facility, the SW at that facility would complete the [facility] to begin the process for SNF/ALF placement. CSW will continue to follow as needed.

## 2010-11-23 NOTE — ED Notes (Signed)
Dr. Stark Jock notified of pt's behavior.

## 2010-11-23 NOTE — ED Notes (Signed)
Son was voicing frustrations with Mariann Laster, [place] (raising his voice) prior to CM being ask to speak with him.  CM able to get son to discuss his concerns in a calmer tone. ED CM spoke with son, [name] who called voicing concern about pt "needing mental help", demanding pt "needs to be sent some where" and about him not approving of pt being recently d/c from hospital to home on Sunday, 11/19/10.   Reporting pt is not "mentally" stable.  Son shared pmh of schizophrenia. Neither [name] nor his sister are POA.  [name] reports living "6 hours away". Son reports pt has been sent to [facility] previously and believes pt needs to go again.  [name] wants a Engineer, water at [employer] to "evaluate her" Reports attempts to get pt an appt with her local psychologist but was informed there is no immediate appts available CM empathized & spoke with [name] about competency and commitment. CM updated Dr Stark Jock of son's call.

## 2010-11-24 ENCOUNTER — Other Ambulatory Visit: Payer: Self-pay

## 2010-11-24 LAB — STOOL CULTURE

## 2010-11-24 NOTE — ED Notes (Signed)
Report called to Kerrie Pleasure, RN at [facility], called Sheriff's office for transport, officer stated that he was unsure when someone with a car for transport would be available, stated he transported the patient last month and she was unable to get into the transport [facility]i due to her immobility issues, will call us 30 minutes before a car is available
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
## 2010-11-24 NOTE — ED Notes (Signed)
Pt. Awake, given soda and graham crackers, per her request.

## 2010-11-24 NOTE — Progress Notes (Cosign Needed)
SW contacted Salvatore Marvel- [facility]. Stated that they received referral but still needed updated labs. Labs faxed to facility along with Findings and Custody order. Awaiting return call from [facility] regarding possible transfer. Lorie Phenix. Akina Maish,BSW SW Intern  11/24/10 0900

## 2010-11-24 NOTE — Progress Notes (Signed)
This note has been reviewed.  Laurena Spies , MSW, LCSWA 11/24/2010,  2:49 PM

## 2010-11-24 NOTE — Progress Notes (Signed)
Per Salvatore Marvel at [facility]- bed available for patient today. IVC papers in place. Notified patient's nurse of d/c plan. She will request transport via [facility].Notified patient's son Marnette Burgess of above note- he is pleased with d/c disposition. Lorie Phenix. Arieonna Medine,BSW SW Intern  11/24/10 1330.

## 2010-11-24 NOTE — ED Notes (Signed)
Attempted to call report to [place], Designer, multimedia both at lunch and unavailable to take report, one of them will return my call for report
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
## 2010-11-24 NOTE — ED Notes (Signed)
Bed available for patient at [facility], Dr Roderic Palau notified, CM to notify son Marnette Burgess of transfer, pt notified of transfer to [place], not wanting to go at this time, IVC papers are still active

## 2010-11-24 NOTE — ED Notes (Signed)
Pt became agitated and screaming refusing to leave, sheriff officer from [place] placed patient in handcuffs to be able to get patient into the wheelchair, at that time the patient became more cooperative and wanted to go with the officer to [facility], officer stated they would remove the handcuffs when patient was safely in the [place] for transport, pt discharged in stable condition without difficulty, patients personal walker sent with patient, patients son notified of transfer, vss

## 2010-11-24 NOTE — ED Notes (Signed)
Son [name] called to check status of transfer, stated that I was waiting for RN at [facility] to return call for report, pt called out to nurses station, requesting to take another bath, towels, paper scrubs, socks and towels given, pt yelling at this RN to get out of her room

## 2010-11-24 NOTE — ED Notes (Signed)
Pt. Receives phone call; language appropriate.

## 2010-12-12 ENCOUNTER — Encounter: Payer: Self-pay | Admitting: Family Medicine

## 2010-12-12 DIAGNOSIS — F209 Schizophrenia, unspecified: Secondary | ICD-10-CM

## 2010-12-19 ENCOUNTER — Ambulatory Visit (HOSPITAL_COMMUNITY): Payer: Medicare Other | Admitting: Physician Assistant

## 2010-12-20 ENCOUNTER — Ambulatory Visit (HOSPITAL_COMMUNITY): Payer: Medicare Other | Admitting: Physician Assistant

## 2011-02-09 ENCOUNTER — Ambulatory Visit (INDEPENDENT_AMBULATORY_CARE_PROVIDER_SITE_OTHER): Payer: Medicare Other | Admitting: Psychiatry

## 2011-02-09 ENCOUNTER — Encounter (HOSPITAL_COMMUNITY): Payer: Self-pay | Admitting: Psychiatry

## 2011-02-09 VITALS — Wt 169.0 lb

## 2011-02-09 DIAGNOSIS — F200 Paranoid schizophrenia: Secondary | ICD-10-CM

## 2011-02-09 DIAGNOSIS — Z79899 Other long term (current) drug therapy: Secondary | ICD-10-CM

## 2011-02-09 MED ORDER — DIVALPROEX SODIUM ER 500 MG PO TB24: 500.0000 mg | ORAL_TABLET | Freq: Every day | ORAL | Status: DC

## 2011-02-09 MED ORDER — OLANZAPINE 5 MG PO TABS: ORAL_TABLET | ORAL | Status: DC

## 2011-02-09 NOTE — Progress Notes (Signed)
Chief complaint I need to see Dr. to continue her medication  History of presenting illness Patient is 66 year old Caucasian divorce unemployed female who came with her son for initial evaluation. Patient was apparently discharged from [facility] in November. Patient was admitted due to acute psychotic episode. Patient is very poor historian and most of the information was obtained from her son and electronic medical record. Patient has long history of schizophrenia and she was stable on Clozaril since last discharge from [facility] 20 yrs ago. She was getting Clozaril at [facility] mental health until it closed 1 year ago. It is unclear why her medications were changed 1 yer ago by Dr. Nelda Marseille. Since than she has been decompensated twice and admitted in September at old [facility] and then in November at [facility]. Patient did not provide information about her inpatient hospitalization. Patient now taking olanzapine Depakote with good response. As per son patient is doing much better and do not want to change the medication. Her sleep is improved. She is no more agitated and angry. Though she is still very pressured labile but her delusions are less intense and less frequent. She continues to endorse paranoid thinking that she'll see sometime people in the house but they are less intense and less frequent. At this time patient reported no side effects of medication however she is very concerned when she read about the side effects of olanzapine. I reviewed discharge summary from [facility] which shows that patient is taking olanzapine 10 mg twice a day and Depakote 250 mg 4 times a day however patient son reported that these medication has been reduce recently and she is now taking olanzapine 5 in the morning and 10 at bedtime and Depakote 500 mg in the morning. . At this time patient has no tremors or extrapyramidal side effects.  Past psychiatric history Patient was  admitted almost 20 years ago at [facility] due to psychosis. Patient has one suicidal attempt many years ago when she tried to cut her wrist in the past patient had tried Haldol Abilify clozapine. Patient had a bad reaction with Abilify. Patient had history of anger and severe psychosis denies any history of homicidal thoughts or plan.  Family history As per son all of his siblings has bipolar disorder.  Psychosocial history Patient was born and raised in [place]. Patient denies any history of sexual or physical verbal and emotional abuse. Patient has 3 children and 6 grandchildren. Patient lives by herself in apartment. Patient has been living by herself for almost 20 years. Patient's husband died due to health reasons.  Education and work history Patient has some college education and currently she is not working.  Alcohol and substance use history patient denies any history of alcohol or drug use.  Medical history GERD COPD Hypertension Gout Coronary artery disease Pacemaker Her recent blood work shows hemoglobin 12.2 sodium 129 potassium 4.2 BUN 12 creatinine 0.8, T4 8 0.0 and hemoglobin A1c 5.1. These labs are done and [facility] in November.   Allergies Patient has allergic reaction with codeine Haldol and latex. Patient also complained of side effects with Cogentin but she is taking without any problem.  Mental status examination Patient is casually dressed and fairly groomed. She uses wheelchair as she has balance patient when she talks. She uses walker in her apartment. Her speech is pressured and incoherent at times. She has increased volume tone and at times difficult to stop. She maintained poor eye  contact. She is still somewhat guarded. At times she is relevant in conversation. Her thought processes circumstantial. She continues to endorse paranoid ideation and belief that people live in her house. Her attention and concentration is  poor. She denies any active or passive suicidal thoughts or homicidal thoughts. She denies any auditory or visual hallucination. She is alert and oriented x2. Her insight and judgment is fair. Her impulse control is okay  Assessment Axis I schizophrenia chronic paranoid type  Axis II deferred Axis III see medical history  Axis IV  medical management and chronic illness Axis V 50  Plan I talked to the patient came to his son [name] in detail about her symptoms in need of medication. At this time patient is still has residual symptoms of schizophrenia however patient is very reluctant to increase her medication. Given the significant history of psychiatric illness patient will require her psychotropic medication however I explained risks and benefits of medication in detail. Patient has history of hypertension and patient will be at risk of developing metabolic syndrome. We will closely monitor her blood work and weight gain however at this time I do not want to change her medication given the fact that patient is somewhat stable on current regime. We will consider changing her medication if patient shows some side effects or do not get better on this regime. I will also talk to the patient that she should see counselor for coping and social skills. I will schedule appointment with a therapist in this office. I have ordered lab work including Depakote level CBC and comprehensive metabolic panel. I will see her again in 3 weeks.

## 2011-02-16 LAB — HEMOGLOBIN A1C
Hgb A1c MFr Bld: 5.2 % (ref <5.7)
Mean Plasma Glucose: 103 mg/dL (ref <117)

## 2011-02-16 LAB — CBC WITH DIFFERENTIAL/PLATELET
Basophils Absolute: 0.1 10*3/uL (ref 0.0–0.1)
Basophils Relative: 1 % (ref 0–1)
Eosinophils Absolute: 0.4 10*3/uL (ref 0.0–0.7)
MCH: 28.9 pg (ref 26.0–34.0)
MCHC: 32.7 g/dL (ref 30.0–36.0)
Neutro Abs: 5.2 10*3/uL (ref 1.7–7.7)
Neutrophils Relative %: 56 % (ref 43–77)
RDW: 13.5 % (ref 11.5–15.5)

## 2011-02-16 LAB — COMPREHENSIVE METABOLIC PANEL
ALT: <8 U/L (ref 0–35)
AST: 13 U/L (ref 0–37)
Albumin: 4.2 g/dL (ref 3.5–5.2)
Alkaline Phosphatase: 67 U/L (ref 39–117)
BUN: 20 mg/dL (ref 6–23)
Calcium: 9.4 mg/dL (ref 8.4–10.5)
Chloride: 103 mEq/L (ref 96–112)
Potassium: 4 mEq/L (ref 3.5–5.3)
Sodium: 137 mEq/L (ref 135–145)
Total Protein: 6.7 g/dL (ref 6.0–8.3)

## 2011-02-21 ENCOUNTER — Ambulatory Visit (HOSPITAL_COMMUNITY): Payer: Medicare Other | Admitting: Licensed Clinical Social Worker

## 2011-02-21 ENCOUNTER — Other Ambulatory Visit (HOSPITAL_COMMUNITY): Payer: Self-pay | Admitting: Psychiatry

## 2011-02-21 DIAGNOSIS — F200 Paranoid schizophrenia: Secondary | ICD-10-CM

## 2011-03-05 ENCOUNTER — Ambulatory Visit (HOSPITAL_COMMUNITY): Payer: Medicare Other | Admitting: Psychiatry

## 2011-03-12 ENCOUNTER — Ambulatory Visit (INDEPENDENT_AMBULATORY_CARE_PROVIDER_SITE_OTHER): Payer: Medicare Other | Admitting: Psychiatry

## 2011-03-12 ENCOUNTER — Encounter (HOSPITAL_COMMUNITY): Payer: Self-pay | Admitting: Psychiatry

## 2011-03-12 VITALS — BP 137/71 | HR 70

## 2011-03-12 DIAGNOSIS — F200 Paranoid schizophrenia: Secondary | ICD-10-CM

## 2011-03-12 MED ORDER — DIVALPROEX SODIUM ER 500 MG PO TB24: 500.0000 mg | ORAL_TABLET | Freq: Every day | ORAL | Status: DC

## 2011-03-12 MED ORDER — BENZTROPINE MESYLATE 0.5 MG PO TABS: 0.5000 mg | ORAL_TABLET | Freq: Every day | ORAL | Status: DC

## 2011-03-12 MED ORDER — OLANZAPINE 5 MG PO TABS: ORAL_TABLET | ORAL | Status: DC

## 2011-03-12 NOTE — Progress Notes (Signed)
Chief complaint I am doing better   History of presenting illness Patient is 66 year old Caucasian divorce unemployed female who came with her son for followup appointment. Her son endorse patient is doing better and taking her medication regularly. She had not seen the therapist due to the transportation issue. She is dependent on her son for appointments. Patient continued to have reluctant behavior about medication compliance however son encourage her almost daily to take medication. Patient reported her sleep is good and she denies any agitation anger or mood swings. Though she has concerned about medication side effects however she did not report any side effects at this time. Overall she has been stable on her current medication.  She has blood work done and results are reviewed. Her Depakote level is 41. Her CBC and chemistries are within normal limits.  Current psychiatric medication Olanzapine 5 mg in the morning and 10 mg at bedtime Cogentin 0.5 mg at bedtime Depakote 500 mg at bedtime  Past psychiatric history Patient was admitted almost 20 years ago at [facility] due to psychosis. Patient has one suicidal attempt many years ago when she tried to cut her wrist in the past patient had tried Haldol Abilify clozapine. Patient had a bad reaction with Abilify. Patient had history of anger and severe psychosis denies any history of homicidal thoughts or plan.  Family history As per son all of his siblings has bipolar disorder.  Psychosocial history Patient was born and raised in [place]. Patient denies any history of sexual or physical verbal and emotional abuse. Patient has 3 children and 6 grandchildren. Patient lives by herself in apartment. Patient has been living by herself for almost 20 years. Patient's husband died due to health reasons.  Education and work history Patient has some college education and currently she is not working.  Alcohol and substance  use history patient denies any history of alcohol or drug use.  Medical history GERD COPD Hypertension Gout Coronary artery disease Pacemaker   Allergies Patient has allergic reaction with codeine Haldol and latex. Patient also complained of side effects with Cogentin but she is taking without any problem.  Mental status examination Patient is casually dressed and fairly groomed. She uses wheelchair as she has balance issue. She is cooperative and maintained fair eye contact. Her speech is fast and rapid but coherent. She has increased volume tone and at times difficult to stop. She is still somewhat guarded.  She still have paranoid ideation about people living in her home but she denies any active or passive suicidal thinking and homicidal thinking. Her thought processes circumstantial. Her attention and concentration is poor. She denies any auditory or visual hallucination. She is alert and oriented x2. Her insight and judgment is fair. Her impulse control is okay  Assessment Axis I schizophrenia chronic paranoid type  Axis II deferred Axis III see medical history  Axis IV  medical management and chronic illness Axis V 50  Plan I will continue olanzapine, Cogentin and Depakote. Lab is her discussed with the patient and given copy of results. One more time I encouraged her to be compliant with the medication to avoid any decompensation. She does not want to see therapist due to transportation problems. I will see her again in 2 months. Time spent 30 minutes

## 2011-03-20 DIAGNOSIS — H029 Unspecified disorder of eyelid: Secondary | ICD-10-CM | POA: Insufficient documentation

## 2011-04-26 DIAGNOSIS — G2401 Drug induced subacute dyskinesia: Secondary | ICD-10-CM | POA: Insufficient documentation

## 2011-04-26 DIAGNOSIS — I10 Essential (primary) hypertension: Secondary | ICD-10-CM | POA: Insufficient documentation

## 2011-04-26 HISTORY — DX: Drug induced subacute dyskinesia: G24.01

## 2011-05-03 DIAGNOSIS — H259 Unspecified age-related cataract: Secondary | ICD-10-CM | POA: Insufficient documentation

## 2011-05-07 ENCOUNTER — Encounter (HOSPITAL_COMMUNITY): Payer: Self-pay | Admitting: Psychiatry

## 2011-05-07 ENCOUNTER — Ambulatory Visit (INDEPENDENT_AMBULATORY_CARE_PROVIDER_SITE_OTHER): Payer: Medicare Other | Admitting: Psychiatry

## 2011-05-07 VITALS — BP 130/75 | HR 72

## 2011-05-07 DIAGNOSIS — F200 Paranoid schizophrenia: Secondary | ICD-10-CM

## 2011-05-07 MED ORDER — BENZTROPINE MESYLATE 0.5 MG PO TABS: 0.5000 mg | ORAL_TABLET | Freq: Every day | ORAL | Status: DC

## 2011-05-07 MED ORDER — DIVALPROEX SODIUM ER 500 MG PO TB24: 500.0000 mg | ORAL_TABLET | Freq: Every day | ORAL | Status: DC

## 2011-05-07 MED ORDER — OLANZAPINE 5 MG PO TABS: ORAL_TABLET | ORAL | Status: DC

## 2011-05-07 NOTE — Progress Notes (Signed)
Chief complaint I want reduce the medication but him am doing better.  History of presenting illness Patient is 66 year old Caucasian divorce unemployed female who came for her followup appointment.  She usually come with her son however today she came by herself.  Patient reported her son was very busy and could not come.  Patient endorse that recently she had eye surgery but did not provide much detail.  She's compliant with the medication but quantity reduce the dosage .  Patient did not explained why she wanted reduce the medication.  She believe this medicine will eventually cause her Parkinson .  Patient does not have any tremors or shakes .  She relies that medicine working well for her.  She sleeps better.  She still has compliance issue but recently she's been taking medication on time.  As per patient there is nothing wrong with her .  She is reluctant to talk about her psychiatric dose.  She uses wheelchair today as she did not bring her walker.  She refused for weight and endorse that she has been stable in her appetite .  She denies any agitation anger or mood swings however appears easily irritable .  She denies any psychosis or paranoia. Her last Depakote level is 41 and her CBC and chemistries are within normal limits.  Current psychiatric medication Olanzapine 5 mg in the morning and 10 mg at bedtime Cogentin 0.5 mg at bedtime Depakote 500 mg at bedtime  Past psychiatric history Patient was admitted almost 20 years ago at [facility] due to psychosis. Patient has one suicidal attempt many years ago when she tried to cut her wrist in the past patient had tried Haldol Abilify clozapine. Patient had a bad reaction with Abilify. Patient had history of anger and severe psychosis denies any history of homicidal thoughts or plan.  Family history As per son all of his siblings has bipolar disorder.  Psychosocial history Patient was born and raised in [place]. Patient  denies any history of sexual or physical verbal and emotional abuse. Patient has 3 children and 6 grandchildren. Patient lives by herself in apartment. Her sons some time stay with her at night. Patient has been living by herself for almost 20 years. Patient's husband died due to health reasons.  Education and work history Patient has some college education and currently she is not working.  Alcohol and substance use history patient denies any history of alcohol or drug use.  Medical history GERD COPD Hypertension Gout Coronary artery disease Pacemaker   Allergies Patient has allergic reaction with codeine Haldol and latex. Patient also complained of side effects with Cogentin but she is taking without any problem.  Mental status examination Patient is casually dressed and fairly groomed. She uses wheelchair as she has balance issue. She is cooperative and maintained fair eye contact. Her speech is fast and rapid but coherent. She has increased volume tone and at times difficult to stop.  She has flight of ideas and loose associations.  She is still somewhat guarded.  She remains paranoid ideation about people but there were no delusions or obsessions present.  She denies any active or passive suicidal thinking and homicidal thinking.  She denies any auditory or visual hallucination.  Her attention and concentration is distracted.  She's alert and oriented x 2.  Assessment Axis I schizophrenia chronic paranoid type  Axis II deferred Axis III see medical history  Axis IV  medical management and chronic illness Axis V  60  Plan I have a long discussion with her about her medication and risk of relapse and noncompliance.  Patient agreed on continued does.  I asked her to bring her son So we can discuss more in detail about reduction of medication if necessary.  At this time patient does not have any side effects.  She does need psychiatric medication to avoid relapse.  I will see her  again in 4 weeks.  Time spent 30 minutes.

## 2011-06-04 ENCOUNTER — Encounter (HOSPITAL_COMMUNITY): Payer: Self-pay | Admitting: Psychiatry

## 2011-06-04 ENCOUNTER — Ambulatory Visit (INDEPENDENT_AMBULATORY_CARE_PROVIDER_SITE_OTHER): Payer: Medicare Other | Admitting: Psychiatry

## 2011-06-04 VITALS — BP 120/68 | HR 74

## 2011-06-04 DIAGNOSIS — F200 Paranoid schizophrenia: Secondary | ICD-10-CM

## 2011-06-04 MED ORDER — BENZTROPINE MESYLATE 0.5 MG PO TABS: 0.5000 mg | ORAL_TABLET | Freq: Every day | ORAL | Status: DC

## 2011-06-04 MED ORDER — DIVALPROEX SODIUM ER 500 MG PO TB24: 500.0000 mg | ORAL_TABLET | Freq: Every day | ORAL | Status: DC

## 2011-06-04 MED ORDER — OLANZAPINE 5 MG PO TABS: ORAL_TABLET | ORAL | Status: DC

## 2011-06-04 NOTE — Progress Notes (Signed)
Chief complaint Medication management and followup.  History of presenting illness Patient is 66 year old Caucasian divorce unemployed female who came for her followup appointment.  She usually come with her son however today she came by herself despite I recommended to have her son to this appointment.  Patient told her son has been very busy.  She's compliant with the medication and reported no side effects.  However she has a lot of concern of psychiatric medication for long-term use.  Initially she has concern about Parkinson however she denies any shakes or tremors.  We added Cogentin however she does not like Cogentin.  She complain of dry mouth and wanted to stop it.  She told she has been taking her medication is prescribed.  She denies any recent agitation anger mood swing.  After some discussion she agreed to continue her medication.  She realize that medicine working well for her.  She sleeps better.  She still has compliance issue but recently she's been taking medication on time.   She uses wheelchair today as she did not bring her walker.  She refused for weight and endorse that she has been stable in her appetite .  She denies any agitation anger or mood swings however appears easily irritable .  She denies any psychosis or paranoia.  She wants to do another blood work however I asked her that is not necessary since her last blood work was done in January.  Her Depakote level is 41 and her CBC and chemistries are within normal limits.  Current psychiatric medication Olanzapine 5 mg in the morning and 10 mg at bedtime Cogentin 0.5 mg at bedtime Depakote 500 mg at bedtime  Past psychiatric history Patient was admitted almost 20 years ago at [facility] due to psychosis. Patient has one suicidal attempt many years ago when she tried to cut her wrist in the past patient had tried Haldol Abilify clozapine. Patient had a bad reaction with Abilify. Patient had history of anger and severe  psychosis denies any history of homicidal thoughts or plan.  Family history As per son all of his siblings has bipolar disorder.  Psychosocial history Patient was born and raised in [place]. Patient denies any history of sexual or physical verbal and emotional abuse. Patient has 3 children and 6 grandchildren. Patient lives by herself in apartment. Her sons some time stay with her at night. Patient has been living by herself for almost 20 years. Patient's husband died due to health reasons.  Education and work history Patient has some college education and currently she is not working.  Alcohol and substance use history patient denies any history of alcohol or drug use.  Medical history GERD COPD Hypertension Gout Coronary artery disease Pacemaker   Allergies Patient has allergic reaction with codeine Haldol and latex. Patient also complained of side effects with Cogentin but she is taking without any problem.  Mental status examination Patient is casually dressed and fairly groomed. She uses wheelchair as she has balance issue. She is cooperative and maintained fair eye contact. Her speech is fast and rapid but coherent.  She is easily distracted and her attention and concentration is poor.  She has flight of ideas and loose associations.  She is still somewhat guarded.  She remains paranoid ideation about people but there were no delusions or obsessions present.  She denies any active or passive suicidal thinking and homicidal thinking.  She denies any auditory or visual hallucination.  Her attention and  concentration is distracted.  She's alert and oriented x 2.  Assessment Axis I schizophrenia chronic paranoid type  Axis II deferred Axis III see medical history  Axis IV  medical management and chronic illness Axis V 50  Plan I explain in detail about the risk of noncompliance of medication.  At this time patient agreed to continue to take all her medication  however she liked to start Cogentin if that helps her dry mouth.  I told her if she started to feel tremulous or shakes than she should restart taking Cogentin.  One more time I encouraged her to bring her son on her next appointment for more accuracy of her compliance and symptoms.  Reassurance given.  Time spent 30 minutes.  I will see her again in 2 months.

## 2011-06-19 ENCOUNTER — Encounter: Payer: Self-pay | Admitting: Family Medicine

## 2011-06-19 ENCOUNTER — Telehealth (HOSPITAL_COMMUNITY): Payer: Self-pay | Admitting: *Deleted

## 2011-06-19 NOTE — Telephone Encounter (Signed)
Contacted patient.Patient reviewed list of medications with this Probation officer. Medication information compared with medications in chart, medication list updated as to prescriber. Patient states she needs her blood checked monthly for infections and similar problems. Informed patient that Dr.Arfeen orders blood tests he determines appropriate to his treatment. Encouraged patient to discuss with PCP by call or next visit if she feels she has problems Patient states she does not want to take Cogentin as it causes Parkinsons and has told Dr.Arfeen this repeatedly. Encouraged patient to discuss with Dr.Arfeen at next visit.  Patient also stated she is allergic to [facility] affects her vision. Chart updated with this information.

## 2011-06-19 NOTE — Telephone Encounter (Signed)
error 

## 2011-06-19 NOTE — Telephone Encounter (Signed)
This encounter was created in error - please disregard.

## 2011-06-19 NOTE — Telephone Encounter (Signed)
Left [id number] sated that Dr.Arfeen wanted to know what medicines patient is taking. She states he should know since he prescribes them. States she has a list she will give when she is called back. Wants to know why her blood work is only done every 5 months.

## 2011-06-21 ENCOUNTER — Ambulatory Visit (INDEPENDENT_AMBULATORY_CARE_PROVIDER_SITE_OTHER): Payer: Medicare Other | Admitting: Family Medicine

## 2011-06-21 ENCOUNTER — Encounter: Payer: Self-pay | Admitting: Family Medicine

## 2011-06-21 VITALS — BP 149/79 | HR 71 | Temp 98.2°F | Ht 66.0 in | Wt 182.0 lb

## 2011-06-21 DIAGNOSIS — R300 Dysuria: Secondary | ICD-10-CM | POA: Insufficient documentation

## 2011-06-21 LAB — POCT URINALYSIS DIPSTICK
Ketones, UA: NEGATIVE
Leukocytes, UA: NEGATIVE
Protein, UA: 100
Spec Grav, UA: 1.015
Urobilinogen, UA: 0.2
pH, UA: 7

## 2011-06-21 LAB — POCT UA - MICROSCOPIC ONLY

## 2011-06-21 MED ORDER — PHENAZOPYRIDINE HCL 100 MG PO TABS: 100.0000 mg | ORAL_TABLET | Freq: Three times a day (TID) | ORAL | Status: AC | PRN

## 2011-06-21 NOTE — Assessment & Plan Note (Signed)
Normal urine analysis.  No fever. No flank pain.   Will prescribe pyridium for symptoms.

## 2011-06-21 NOTE — Progress Notes (Signed)
Addended by: Martin[place], Dellas Guard on: 06/21/2011 12:25 PM   Modules accepted: Orders

## 2011-06-21 NOTE — Progress Notes (Signed)
  Subjective:   Patient ID: Abigail Wallace, female DOB: 08-04-1945 66 y.o. MRN: [id number] HPI:  1. Concern about having diabetes  Synopsis: patient is a paranoid schizophrenic who does not think clearly and often believes she has illness despite tests and medical advice against it. She thinks she has diabetes. Her last 2 HgA1c have been 5.2 and normal. She has no polyuria or polydipsia. She is on olanzepine, but BMP' are monitored by her psychiatrist.   2. dysuria Course: worsening Synopsis: patient states she has burning when she voids and fevers. She has no objective fever at home or today. She has no flank pain. Her U/A was normal today.  Onset: has been acute  Time period of: 3 day(s).  Severity is described as moderate.  Aggravating: unknown Alleviating: none Associated sx/sn: see above.   History  Substance Use Topics  . Smoking status: Current Everyday Smoker -- 0.2 packs/day    Types: Cigarettes  . Smokeless tobacco: Not on file  . Alcohol Use: No    Review of Systems: Pertinent items are noted in HPI.  Labs Reviewed: yes Reviewed Chart Review for last notes.     Objective:   Filed Vitals:   06/21/11 1126  BP: 149/79  Pulse: 71  Temp: 98.2 F (36.8 C)  TempSrc: Oral  Height: 5\' 6"  (1.676 m)  Weight: 182 lb (82.555 kg)   Physical Exam: General: obese c/f with a walker, pleasant Abdomen: soft and non-tender without masses, organomegaly or hernias noted.  No guarding or rebound Back: small wound that is well healing in sacral region.  Skin:  Intact without suspicious lesions or rashes Assessment & Plan:

## 2011-08-01 ENCOUNTER — Ambulatory Visit (HOSPITAL_COMMUNITY): Payer: Self-pay | Admitting: Psychiatry

## 2011-08-06 ENCOUNTER — Encounter: Payer: Self-pay | Admitting: Family Medicine

## 2011-08-06 DIAGNOSIS — I2510 Atherosclerotic heart disease of native coronary artery without angina pectoris: Secondary | ICD-10-CM

## 2011-08-06 NOTE — Assessment & Plan Note (Signed)
Documentation only. Phillips. AV paced 94% of time, V-paced >95% of time. Pacemaker induced LBBB.

## 2011-08-08 ENCOUNTER — Telehealth (HOSPITAL_COMMUNITY): Payer: Self-pay

## 2011-08-08 ENCOUNTER — Ambulatory Visit (HOSPITAL_COMMUNITY): Payer: Self-pay | Admitting: Psychiatry

## 2011-08-08 NOTE — Telephone Encounter (Signed)
8:29am 08/08/11 called this morning stating that her sister died this morning will not make the appt./sh

## 2011-08-09 ENCOUNTER — Telehealth (HOSPITAL_COMMUNITY): Payer: Self-pay | Admitting: *Deleted

## 2011-08-09 ENCOUNTER — Other Ambulatory Visit (HOSPITAL_COMMUNITY): Payer: Self-pay | Admitting: Psychiatry

## 2011-08-09 MED ORDER — LORAZEPAM 0.5 MG PO TABS: ORAL_TABLET | ORAL | Status: DC

## 2011-08-09 NOTE — Telephone Encounter (Signed)
Spoke to son.  Patient's sister died on 2022/05/12.  She has been not sleeping very well.  She is very restless anxious.  Son is worried about her mother.  I recommend to take lorazepam 0.5 mg 1-2 tablet for insomnia.  I explained the risk and benefit especially long-term concerned of benzodiazepine.  Son will manage her medication.  I will call pharmacy for 10 tablets.  I recommend to call us if she does not feel any better.

## 2011-08-09 NOTE — Telephone Encounter (Signed)
Son called to say that patient's sister died 04-24-22. Since then, she is having problems with increased nervousness and can't sleep well. Wants to know if you could prescribe something to help her?

## 2011-08-27 ENCOUNTER — Other Ambulatory Visit (HOSPITAL_COMMUNITY): Payer: Self-pay | Admitting: Psychiatry

## 2011-08-27 DIAGNOSIS — F200 Paranoid schizophrenia: Secondary | ICD-10-CM

## 2011-09-05 ENCOUNTER — Encounter (HOSPITAL_COMMUNITY): Payer: Self-pay | Admitting: Psychiatry

## 2011-09-05 ENCOUNTER — Ambulatory Visit (INDEPENDENT_AMBULATORY_CARE_PROVIDER_SITE_OTHER): Payer: Medicare Other | Admitting: Psychiatry

## 2011-09-05 VITALS — BP 128/74 | HR 82 | Wt 195.6 lb

## 2011-09-05 DIAGNOSIS — F200 Paranoid schizophrenia: Secondary | ICD-10-CM

## 2011-09-05 MED ORDER — DIVALPROEX SODIUM ER 500 MG PO TB24: 500.0000 mg | ORAL_TABLET | Freq: Every day | ORAL | Status: DC

## 2011-09-05 MED ORDER — LORAZEPAM 0.5 MG PO TABS: ORAL_TABLET | ORAL | Status: DC

## 2011-09-05 MED ORDER — OLANZAPINE 5 MG PO TABS: ORAL_TABLET | ORAL | Status: DC

## 2011-09-05 NOTE — Progress Notes (Signed)
Chief complaint My sister died last month.  I have having issues with my sleep.    History of present illness  Patient is 66 year old Caucasian divorce unemployed female who came for her followup appointment with her son.  Patient's sister died last month .  She's going through the grief.  She endorse crying spells insomnia and feeling down however her son reports that she is handling the loss of her sister better than he thought.  Patient is compliant with the medication except for Cogentin which she stopped due to dry mouth .  Patient was given lorazepam 0.5 mg when she called and complain of poor sleep due to the loss of her sister.  Patient took lorazepam and felt better.  She does not want to take lorazepam on a regular basis however she endorse feeling anxious and nervous sometimes.  She is very concerned about her son and other family members.  She's also concerned about her physical health.  She continues to have somatic paranoia complain and delusions.  She wants to get another blood work however she has blood work done in January.  Her last Depakote level was 41.  She is taking Tylenol for insomnia.  She's not drinking or using any illegal substance.  She resume his smoking after the death of her sister however her son is only limited 2 cigarettes a day.  Son is trying to help her mother to quit smoking.  She has gained some weight from the last visit.  Current psychiatric medication Olanzapine 5 mg in Am and 10 at HS Depakote 500 mg at bedtime Lorazepam 0.5 mg half to one tablet as needed.    Past psychiatric history Patient was admitted almost 20 years ago at [facility] due to psychosis. Patient has one suicidal attempt many years ago when she tried to cut her wrist in the past patient had tried Haldol Abilify clozapine. Patient had a bad reaction with Abilify. Patient had history of anger and severe psychosis denies any history of homicidal thoughts or plan.  Psychosocial  history Patient was born and raised in [place]. Patient denies any history of sexual or physical verbal and emotional abuse. Patient has 3 children and 6 grandchildren. Patient lives by herself in apartment. Her sons some time stay with her at night. Patient has been living by herself for almost 20 years. Patient's husband died due to health reasons. her sister died recently.  Education and work history Patient has some college education and currently she is not working.  Medical history. Patient does not have any primary care physician .  She see cardiologist Dr. Rollene Fare who is managing her GERD, COPD, Hypertension, Gout, Coronary artery disease.  She has Pacemaker.  Mental status examination Patient is casually dressed and fairly groomed. She uses wheelchair as she has balance issue. She is cooperative and maintained fair eye contact. Her speech is fast and rapid but coherent.  She is easily distracted and her attention and concentration is poor.  She has flight of ideas and loose associations.  She is guarded and provide minimal information about her psychiatric illness.  She has paranoia and somatic delusions.  She believe people are talking about her.  She denies any active or passive suicidal thinking and homicidal thinking.  She denies any auditory or visual hallucination.    She has poor recall. She's alert and oriented x 2.  Assessment Axis I schizophrenia chronic paranoid type  Axis II deferred Axis III see medical history  Axis IV  medical management and chronic illness Axis V 50  Plan I  reviewed chart, last progress note, psychosocial stressor and medication.  Patient still has residual symptoms of paranoia and mood lability.  She still has insomnia and she is going through the grief.  I offered counseling but patient refused for counseling.  As per son patient did better when she was taking lorazepam.  Patient does not want to increase her antipsychotic  medication.  She also gained some weight from her last visit.  I will provide lorazepam 0.5 mg half to one tablet as needed for insomnia.  I strongly encouraged to stop smoking and recommend to monitor her weight and do some exercise.  I recommend to call us if she is a question or concern about the medication she feel worsening of the symptoms.  Time spent 30 minutes I will see her again in 2 months.  Portion of this note is generated with voice dictation software and may contain typographical error.

## 2011-09-19 ENCOUNTER — Other Ambulatory Visit (HOSPITAL_COMMUNITY): Payer: Self-pay | Admitting: Psychiatry

## 2011-09-19 ENCOUNTER — Ambulatory Visit: Payer: Self-pay | Admitting: Family Medicine

## 2011-09-26 ENCOUNTER — Other Ambulatory Visit (HOSPITAL_COMMUNITY)
Admission: RE | Admit: 2011-09-26 | Discharge: 2011-09-26 | Disposition: A | Discharge status: Home / Self Care | Payer: Medicare Other | Source: Ambulatory Visit | Attending: Family Medicine | Admitting: Family Medicine

## 2011-09-26 ENCOUNTER — Encounter: Payer: Self-pay | Admitting: Family Medicine

## 2011-09-26 ENCOUNTER — Ambulatory Visit (INDEPENDENT_AMBULATORY_CARE_PROVIDER_SITE_OTHER): Payer: Medicare Other | Admitting: Family Medicine

## 2011-09-26 ENCOUNTER — Telehealth (HOSPITAL_COMMUNITY): Payer: Self-pay | Admitting: *Deleted

## 2011-09-26 VITALS — BP 145/83 | HR 69 | Temp 97.6°F

## 2011-09-26 DIAGNOSIS — Z124 Encounter for screening for malignant neoplasm of cervix: Secondary | ICD-10-CM

## 2011-09-26 DIAGNOSIS — R109 Unspecified abdominal pain: Secondary | ICD-10-CM

## 2011-09-26 DIAGNOSIS — M109 Gout, unspecified: Secondary | ICD-10-CM

## 2011-09-26 DIAGNOSIS — N760 Acute vaginitis: Secondary | ICD-10-CM

## 2011-09-26 LAB — COMPREHENSIVE METABOLIC PANEL
ALT: <8 U/L (ref 0–35)
CO2: 22 mEq/L (ref 19–32)
Calcium: 9.5 mg/dL (ref 8.4–10.5)
Chloride: 103 mEq/L (ref 96–112)
Glucose, Bld: 89 mg/dL (ref 70–99)
Sodium: 135 mEq/L (ref 135–145)
Total Bilirubin: 0.4 mg/dL (ref 0.3–1.2)
Total Protein: 6.5 g/dL (ref 6.0–8.3)

## 2011-09-26 LAB — POCT UA - MICROSCOPIC ONLY

## 2011-09-26 LAB — POCT URINALYSIS DIPSTICK
Bilirubin, UA: NEGATIVE
Glucose, UA: NEGATIVE
Nitrite, UA: NEGATIVE

## 2011-09-26 LAB — CBC
Platelets: 309 10*3/uL (ref 150–400)
RBC: 4.57 MIL/uL (ref 3.87–5.11)
RDW: 14.6 % (ref 11.5–15.5)
WBC: 9.7 10*3/uL (ref 4.0–10.5)

## 2011-09-26 MED ORDER — WALKER MISC: 1.0000 [IU] | Freq: Every day | Status: DC

## 2011-09-26 MED ORDER — WHEELCHAIR MISC: 1.0000 [IU] | Freq: Every day | Status: DC

## 2011-09-26 MED ORDER — FLUCONAZOLE 150 MG PO TABS: 150.0000 mg | ORAL_TABLET | Freq: Once | ORAL | Status: AC

## 2011-09-26 NOTE — Telephone Encounter (Signed)
Patient's son called and wondering if his mother medication has been reduced.  He is very concerned because his mother does not do very well but does and has been drug use.  I resure his son that medicine has not been reduced.  His mother is doing very well on his current psychiatric medication.

## 2011-09-26 NOTE — Patient Instructions (Addendum)
We will refer you to Gynecology to take a look at your vaginal area (because it is read and hurts)  Take the anti-fungal medication. It will just be 1 tablet.   If your lab results are normal, I will send you a letter with the results. If abnormal, someone at the clinic will get in touch with you.

## 2011-09-26 NOTE — Telephone Encounter (Signed)
See call note

## 2011-09-26 NOTE — Addendum Note (Signed)
Addended by: [place], Theresa Dohrman on: 09/26/2011 12:17 PM   Modules accepted: Orders

## 2011-09-26 NOTE — Assessment & Plan Note (Addendum)
She is a poor historian with her severe schizophrenia.  She reports abdominal pain without other GI or GU symptoms. She does have lower abdominal tenderness. -Will check urinalysis to rule-out UTI>>>negative  -Will check CBC, CMET  She is taking Alleve and ibuprofen for the pain -Recommend changing to Tylenol

## 2011-09-26 NOTE — Assessment & Plan Note (Signed)
She presents with persistent vulvar erythema. I am not sure she tried the antifungal cream.  -Will give Rx for Diflucan x 1 -Will refer to gynecology for evaluation. I am concerned based on chronicity of rash that she may have other underlying process (dermatitis, lichen planus) and may need biopsy of area. I have recommended the patient bring her sister along to appointment to help during interview.

## 2011-09-26 NOTE — Progress Notes (Signed)
  Subjective:    Patient ID: Abigail Wallace, female    DOB: 03-31-1945, 66 y.o.   MRN: [id number]  HPI Patient is a poor historian.  # Abdominal pain It has been going on since Tuesday. It feels like a constant ache. It does not change with food intake. She has been eating well. She denies diarrhea or vomiting. Her last bowel movement was yesterday and it was normal.   She denies fevers or chills.   Review of Systems See above Denies dysuria  Allergies, medication, past medical history reviewed.  Significant for: -HLD -Schizophrenia--followed by [facility]; Depakote, Ativan prn sleep (recently started Aug 10, 2011 due to death in family and subsequent insomnia), olanzapine -CAD, SSS s/p pacemaker, chronic diastolic, rosuvastatin 5 HF--metoprolol, losartan, NTG prn, ASA 81 -COPD, OSA -Microscopic hematuria (on 03/2010 UA)--she has been referred to urology -Osteoarthritis knee and hip -OAB, vaginal prolapse    Objective:   Physical Exam GEN: NAD; obese PSYCH: it is difficult to get her to focus on one topic since she goes off on tangents frequently; she is re-directable, but this is sometimes difficult to do; she is alert and oriented to name and place; she is appropriate to most questions, however, sometimes her questions are inappropriate (she asks me a lot of personal questions) and she makes random comments ABD: NABS, soft, suprapubic tenderness BACK: no CVA tenderness GU:    Vulva: moist erythematous rash   Vagina: prolapsed; non-tender, thin white discharge   Cervix: grossly normal NEURO: she is in a wheelchair and she required significant assistance going from the wheelchair to the examination table; her left foot appears contracted; she reports using a wheelchair or a walker at home    Assessment & Plan:

## 2011-09-27 ENCOUNTER — Telehealth: Payer: Self-pay | Admitting: Family Medicine

## 2011-09-27 ENCOUNTER — Encounter: Payer: Self-pay | Admitting: Obstetrics & Gynecology

## 2011-09-27 DIAGNOSIS — R109 Unspecified abdominal pain: Secondary | ICD-10-CM

## 2011-09-27 NOTE — Telephone Encounter (Signed)
Mr. Lia Hopping calling regarding his mother's visit yesterday.  Need to know results of any tests that were taken and what's the reason for her having the issues with her stomach and back.  Informed him that [name] must be present and able to verbally give auth for provider to speak with son about this.

## 2011-09-28 ENCOUNTER — Encounter: Payer: Self-pay | Admitting: Family Medicine

## 2011-09-28 MED ORDER — TRAMADOL HCL 50 MG PO TABS: 50.0000 mg | ORAL_TABLET | Freq: Three times a day (TID) | ORAL | Status: AC | PRN

## 2011-09-28 NOTE — Telephone Encounter (Signed)
Telephone encounter. Discussed lab results with her son.  Normal ESR, WBC, UA, LFT. Infection unlikely cause of abdominal pain.   Of note, she does have vaginal prolapse, but I feel it would be unusual to cause suprapubic tenderness. She does have appointment with Gyn to evaluate her vulvar rash. I advised him to make appointment to discuss management of her prolapse following her initial appointment with them.   Since she is also having aches in her hips and ankles that I suspect is due to arthritis, I will send in Rx for Tramadol to see if this helps both pains.   She has already tried significant Tylenol (son says she takes "a bottle a week").  Follow-up as soon as conveniently possible to discuss joint aches.

## 2011-09-28 NOTE — Assessment & Plan Note (Signed)
Telephone encounter. Discussed lab results with her son.  Normal ESR, WBC, UA, LFT. Infection unlikely cause of abdominal pain.   Of note, she does have vaginal prolapse, but I feel it would be unusual to cause suprapubic tenderness. She does have appointment with Gyn to evaluate her vulvar rash. I advised him to make appointment to discuss management of her prolapse following her initial appointment with them.   Since she is also having aches in her hips and ankles that I suspect is due to arthritis, I will send in Rx for Tramadol to see if this helps both pains.   She has already tried significant Tylenol (son says she takes "a bottle a week").  Follow-up as soon as conveniently possible to discuss joint aches.

## 2011-10-11 ENCOUNTER — Telehealth (HOSPITAL_COMMUNITY): Payer: Self-pay | Admitting: *Deleted

## 2011-10-11 NOTE — Telephone Encounter (Signed)
Son left [id number] mother received a jury summons. Would Dr.Arfeen be able to write a letter they could send to have her excused? Asked that we contact his mother(pt) at [phone].

## 2011-10-12 NOTE — Telephone Encounter (Signed)
Patient requesting a letter to excuse from to duty.  We will provide letter.

## 2011-10-18 ENCOUNTER — Encounter: Payer: Self-pay | Admitting: Obstetrics & Gynecology

## 2011-11-07 ENCOUNTER — Ambulatory Visit (INDEPENDENT_AMBULATORY_CARE_PROVIDER_SITE_OTHER): Payer: Medicare Other | Admitting: Psychiatry

## 2011-11-07 ENCOUNTER — Encounter (HOSPITAL_COMMUNITY): Payer: Self-pay | Admitting: Psychiatry

## 2011-11-07 VITALS — BP 148/71 | HR 70

## 2011-11-07 DIAGNOSIS — F200 Paranoid schizophrenia: Secondary | ICD-10-CM

## 2011-11-07 MED ORDER — LORAZEPAM 0.5 MG PO TABS: ORAL_TABLET | ORAL | Status: DC

## 2011-11-07 MED ORDER — OLANZAPINE 5 MG PO TABS: ORAL_TABLET | ORAL | Status: DC

## 2011-11-07 MED ORDER — DIVALPROEX SODIUM ER 500 MG PO TB24: 500.0000 mg | ORAL_TABLET | Freq: Every day | ORAL | Status: DC
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
## 2011-11-08 ENCOUNTER — Ambulatory Visit (INDEPENDENT_AMBULATORY_CARE_PROVIDER_SITE_OTHER): Payer: Medicare Other | Admitting: Obstetrics & Gynecology

## 2011-11-08 ENCOUNTER — Other Ambulatory Visit: Payer: Self-pay | Admitting: Obstetrics & Gynecology

## 2011-11-08 ENCOUNTER — Encounter: Payer: Self-pay | Admitting: Obstetrics & Gynecology

## 2011-11-08 VITALS — BP 145/84 | HR 70 | Temp 97.4°F

## 2011-11-08 DIAGNOSIS — Z1231 Encounter for screening mammogram for malignant neoplasm of breast: Secondary | ICD-10-CM

## 2011-11-08 DIAGNOSIS — B373 Candidiasis of vulva and vagina: Secondary | ICD-10-CM

## 2011-11-08 DIAGNOSIS — N643 Galactorrhea not associated with childbirth: Secondary | ICD-10-CM

## 2011-11-08 DIAGNOSIS — B3731 Acute candidiasis of vulva and vagina: Secondary | ICD-10-CM

## 2011-11-08 MED ORDER — FLUCONAZOLE 150 MG PO TABS: 150.0000 mg | ORAL_TABLET | Freq: Once | ORAL | Status: DC

## 2011-11-08 NOTE — Patient Instructions (Signed)
Candidal Vulvovaginitis Candidal vulvovaginitis is an infection of the vagina and vulva. The vulva is the skin around the opening of the vagina. This may cause itching and discomfort in and around the vagina.  HOME CARE  Only take medicine as told by your doctor.  Do not have sex (intercourse) until the infection is healed or as told by your doctor.  Practice safe sex.  Tell your sex partner about your infection.  Do not douche or use tampons.  Wear cotton underwear. Do not wear tight pants or panty hose.  Eat yogurt. This may help treat and prevent yeast infections. GET HELP RIGHT AWAY IF:   You have a fever.  Your problems get worse during treatment or do not get better in 3 days.  You have discomfort, irritation, or itching in your vagina or vulva area.  You have pain after sex.  You start to get belly (abdominal) pain. MAKE SURE YOU:  Understand these instructions.  Will watch your condition.  Will get help right away if you are not doing well or get worse. Document Released: 03/30/2008 Document Revised: 03/26/2011 Document Reviewed: 03/30/2008 [facility] Patient Information 2013 [place].

## 2011-11-08 NOTE — Progress Notes (Signed)
States went to regular doctor for pap smear, and they told her they thought she had an infection.  She was given one pill for yeast. States she has burning of her perineal area. Patient states not able to walk will- unable to get weight and height. Has home health aide.

## 2011-11-08 NOTE — Progress Notes (Signed)
Subjective:     Patient ID: Abigail Wallace, female   DOB: 12-28-1945, 66 y.o.   MRN: [id number]  HPI  Pt c/o bilateral milk drainage from breasts and irritation of vulva.  The breast discharge is bilateral and has been present for an unknown period of time.  The vulvar irritation has been present for 2-3 weeks.  Past Medical History  Diagnosis Date  . Cystitis     Frequent episodes.   . Gout   . Coronary artery disease   . COPD (chronic obstructive pulmonary disease)   . GERD (gastroesophageal reflux disease)   . Mental disorder   . Psychosis    Past Surgical History  Procedure Date  . Pacemaker placement 09/28/2005    2/2 SSS?  Marland Kitchen Cholecystectomy   . Tubal ligation   . Cardiac catheterization 2007    Non-critical.   . Persantine stress test 05/01/2010    EF 66%. Normal LV sys fx. Unchanged from previous studies.   . Insert / replace / remove pacemaker 2007    [place]     see med list   Review of Systems     Objective:   Physical Exam BP 145/84  Pulse 70  Temp 97.4 F (36.3 C) Breast: no nipple discharge noted, no skin changes and no masses GU: EGBUS: extensive red plaques with satellite lesions  Vagina: no blood in vault Cervix: no lesion; no mucopurulent d/c       Assessment:     Yeast vulvovaginitis- d/w pt and son keeping area clean and dry Galactorrhea- pt on multiple meds that could contribute to this Also, has not had screening mammogram     Plan:     Diflucan 150mg  po qod x 3 tablets Screening mammogram F/u prn

## 2011-11-21 ENCOUNTER — Telehealth: Payer: Self-pay | Admitting: *Deleted

## 2011-11-21 NOTE — Telephone Encounter (Signed)
Lulah called and left a message stating it it important that she speaks with Dr. Conchita Paris- please have her call me ( may mean Dr. Ihor Dow)

## 2011-11-21 NOTE — Telephone Encounter (Signed)
Called Abigail Wallace and she states she is having pain in her lower abdomen on both sides that is relieved by pain medicine she already has and takes for other kinds of pain, but states she wants to know why she is having the pain. States she was having it when she saw the doctor in October but didn't say anything because she didn't know what to say. I asked if she has a regular doctor as it may not be gyn and she says her doctor ( Dr. Verdie Drown) sent her to [place] for it- I informed her she would need to be seen for it and transferred her to the appoinment line.

## 2011-11-26 ENCOUNTER — Ambulatory Visit (HOSPITAL_COMMUNITY): Payer: Medicare Other

## 2011-12-19 ENCOUNTER — Telehealth: Payer: Self-pay | Admitting: General Practice

## 2011-12-19 NOTE — Telephone Encounter (Signed)
Patient called and left message stating she wants to speak to Dr Ihor Dow and would like for the dr to call her back soon. Attempted to contact patient, man answered the phone and said she was in the shower and to call back in 20 minutes please.

## 2011-12-20 ENCOUNTER — Encounter: Payer: Self-pay | Admitting: *Deleted

## 2011-12-20 NOTE — Telephone Encounter (Signed)
Called and spoke to pt regarding her concern. She states that she "hears fluid moving on her Lt side". She also states that she had received a letter from Dr. Verdie Drown which stated that she needed to have a procedure. She cannot find the letter and is not sure of the name of the procedure. I reviewed pt's chart and could not find any letter according to the pt's description. She also states that she sometimes has frequent bowel movements. Pt feels she needs to be seen right away. Pt denies pain. I told her that her problem does not seem to be Gyn related and I advised pt that she should call the [facility] tomorrow morning to schedule an appt w/Dr. Verdie Drown or first available provider.  Pt agreed and voiced understanding.

## 2011-12-26 ENCOUNTER — Ambulatory Visit: Payer: Self-pay | Admitting: Family Medicine

## 2011-12-27 ENCOUNTER — Other Ambulatory Visit (HOSPITAL_COMMUNITY): Payer: Self-pay | Admitting: Psychiatry

## 2011-12-27 ENCOUNTER — Telehealth (HOSPITAL_COMMUNITY): Payer: Self-pay

## 2011-12-27 NOTE — Telephone Encounter (Signed)
I tried to call few times at her home but her phone was busy.  There is no other number listed.

## 2011-12-28 ENCOUNTER — Ambulatory Visit (INDEPENDENT_AMBULATORY_CARE_PROVIDER_SITE_OTHER): Payer: Medicare Other | Admitting: Family Medicine

## 2011-12-28 ENCOUNTER — Encounter: Payer: Self-pay | Admitting: Family Medicine

## 2011-12-28 VITALS — BP 150/81 | HR 69 | Temp 98.0°F | Ht 66.0 in

## 2011-12-28 DIAGNOSIS — N898 Other specified noninflammatory disorders of vagina: Secondary | ICD-10-CM

## 2011-12-28 DIAGNOSIS — R109 Unspecified abdominal pain: Secondary | ICD-10-CM

## 2011-12-28 LAB — CBC WITH DIFFERENTIAL/PLATELET
Basophils Absolute: 0.1 10*3/uL (ref 0.0–0.1)
Basophils Relative: 1 % (ref 0–1)
Eosinophils Absolute: 0.2 10*3/uL (ref 0.0–0.7)
MCH: 28.8 pg (ref 26.0–34.0)
MCHC: 32.5 g/dL (ref 30.0–36.0)
Neutro Abs: 5.7 10*3/uL (ref 1.7–7.7)
Neutrophils Relative %: 55 % (ref 43–77)
Platelets: 282 10*3/uL (ref 150–400)
RDW: 14.7 % (ref 11.5–15.5)

## 2011-12-28 LAB — POCT WET PREP (WET MOUNT)

## 2011-12-28 LAB — COMPREHENSIVE METABOLIC PANEL
ALT: <8 U/L (ref 0–35)
AST: 12 U/L (ref 0–37)
Albumin: 4.3 g/dL (ref 3.5–5.2)
BUN: 19 mg/dL (ref 6–23)
Calcium: 9.2 mg/dL (ref 8.4–10.5)
Chloride: 107 mEq/L (ref 96–112)
Potassium: 4.5 mEq/L (ref 3.5–5.3)
Sodium: 139 mEq/L (ref 135–145)
Total Protein: 6.8 g/dL (ref 6.0–8.3)

## 2011-12-28 LAB — POCT URINALYSIS DIPSTICK
Bilirubin, UA: NEGATIVE
Glucose, UA: NEGATIVE
Nitrite, UA: NEGATIVE

## 2011-12-28 NOTE — Progress Notes (Signed)
  Subjective:    Patient ID: Abigail Wallace, female    DOB: [birth year], 66 y.o.   MRN: [id number]  HPI # Lower abdominal pain bilaterally She mentioned this at her visit in September The pain has worsened since then It is there constantly. She feels like she has "bubbles" in her abdomen. She thinks she needs to get admitted to the hospital and is requesting x-rays of her leg and back as well.  She has a history of schizophrenia, which makes her history difficult to interpret.  She denies dysuria but complains of frequency and urgency. She has a history of overactive bladder with symptoms consistent with urge incontinence.  She denies blood in urine or bowels.  She denies constipation and has regular bowel movements but she has had diarrhea for the past few days. Her abdominal pain does not change based on food intake or defecation.  She has occasional vomiting (maybe once a week?) but denies nausea.   She is taking Tylenol 2-3 tablets at a time 2-3 times a day for the pain   She continues to smoke   Review of Systems Denies fevers, chills, vaginal bleeding   Allergies, medication, past medical history reviewed.  SSS, diastolic HF, CAD, pacemaker  COPD Right hip osteoarthritis Diagnosed with candida vulvovaginitis 10/24 and is s/p Diflucan oral. I also had diagnosed her 09/2011 with vulvovaginal yeast and she took Diflucan at that time as well. Schizophrenia--on olanzapine    Social history: She lives alone but her family lives nearby and her son administers her medications    Objective:   Physical Exam GEN: NAD: morbidly obese; accompanied by her son  ABD: NABS, soft, NT, obese, prominent reducible umbilical hernia GU:    Vulva: erythematous rash consistent with yeast between leg folds   Vagina: thin white vaginal discharge; non-tender; prolapse    Cervix: normal appearing   Rectum: good rectal tone  Hemoccult negative     Assessment & Plan:

## 2011-12-28 NOTE — Patient Instructions (Addendum)
Let's take a picture of your belly to make sure your hernia, kidneys, ovaries are okay   Follow-up next week so we can go over your picture results and lab

## 2011-12-28 NOTE — Assessment & Plan Note (Addendum)
She returns today due to persistent bilaterally abdominal pain. She saw me 09/28/2011 for this. She is concerned because the pain is worsening.  We will check CT abdomen/pelvis with contrast to evaluate her chronic hernia, ovaries, and kidneys. Scheduled for 12/18.  We will also check urinalysis, wet prep, CBC, and CMET today.  They will follow-up in 1 week to discuss results. I will call them sooner if results are worrisome abnormal.  D/Dx: hernia, IBS, renal colic, adnexal cyst/mass or adenomyosis, psychogenic

## 2011-12-31 ENCOUNTER — Telehealth: Payer: Self-pay | Admitting: Family Medicine

## 2011-12-31 DIAGNOSIS — B372 Candidiasis of skin and nail: Secondary | ICD-10-CM

## 2011-12-31 MED ORDER — FLUCONAZOLE 150 MG PO TABS: 150.0000 mg | ORAL_TABLET | Freq: Once | ORAL | Status: DC

## 2011-12-31 MED ORDER — NYSTATIN 100000 UNIT/GM EX CREA: TOPICAL_CREAM | Freq: Two times a day (BID) | CUTANEOUS | Status: DC

## 2011-12-31 NOTE — Telephone Encounter (Signed)
Pt was here last week and thinks that Dr Verdie Drown is intentionally not giving her the medicines that she needs (yeast infection and she has a fever) states that she has been taking Tylenol to keep fever down.  Wants to know why Dr Verdie Drown has not called in her meds.

## 2011-12-31 NOTE — Assessment & Plan Note (Signed)
Rash consistent with candida intertrigo seen at her last visit. Rx for Diflucan (per patient request) and also topical anti-fungal to use as needed called in. Patient, her son, and I also spoke about keeping that area dry. (She is incontinent of urine).

## 2011-12-31 NOTE — Telephone Encounter (Signed)
States she needs medication bad because she thinks she has an infection inside. I do not see any medication listed on med list. Will page Dr. Candace Cruise to ask about this. Patient states she has fever but has not taken actual  temp.. States she is dehydrated and doctor was suppose to have given her meds.

## 2011-12-31 NOTE — Telephone Encounter (Signed)
Spoke with patient and her son. She has a history of schizophrenia.  I apologized for not calling in treatment for candida intertrigo at last visit. She insisted she have an oral medication for 7 days. I informed her that I would call in Rx for Diflucan for up to 2 tablets and she may use cream as needed. Her son then intercepted the phone and was amenable to this plan.  They have CT abdomen scheduled for Wednesday. She is still having abdominal pain. I will not call in any medication at this time. She may continue Tylenol for now, up to 3 g daily. Laboratory work-up CMET, wet prep, urinalysis, CBC has thus far not shown any significant abnormalities. This was discussed with son.  They will follow-up with me end of this week.

## 2012-01-02 ENCOUNTER — Ambulatory Visit (HOSPITAL_COMMUNITY)
Admission: RE | Admit: 2012-01-02 | Discharge: 2012-01-02 | Disposition: A | Discharge status: Home / Self Care | Payer: Medicare Other | Source: Ambulatory Visit | Attending: Family Medicine | Admitting: Family Medicine

## 2012-01-02 DIAGNOSIS — N949 Unspecified condition associated with female genital organs and menstrual cycle: Secondary | ICD-10-CM | POA: Insufficient documentation

## 2012-01-02 DIAGNOSIS — N289 Disorder of kidney and ureter, unspecified: Secondary | ICD-10-CM | POA: Insufficient documentation

## 2012-01-02 DIAGNOSIS — R109 Unspecified abdominal pain: Secondary | ICD-10-CM

## 2012-01-02 DIAGNOSIS — R911 Solitary pulmonary nodule: Secondary | ICD-10-CM | POA: Insufficient documentation

## 2012-01-02 DIAGNOSIS — K429 Umbilical hernia without obstruction or gangrene: Secondary | ICD-10-CM | POA: Insufficient documentation

## 2012-01-02 DIAGNOSIS — N898 Other specified noninflammatory disorders of vagina: Secondary | ICD-10-CM

## 2012-01-02 MED ORDER — IOHEXOL 300 MG/ML  SOLN
80.0000 mL | Freq: Once | INTRAMUSCULAR | Status: AC | PRN
  Administered 2012-01-02: 80 mL via INTRAVENOUS

## 2012-01-03 ENCOUNTER — Other Ambulatory Visit (HOSPITAL_COMMUNITY): Payer: Self-pay | Admitting: Psychiatry

## 2012-01-03 ENCOUNTER — Telehealth: Payer: Self-pay | Admitting: Family Medicine

## 2012-01-03 ENCOUNTER — Encounter: Payer: Self-pay | Admitting: Family Medicine

## 2012-01-03 NOTE — Telephone Encounter (Signed)
Patient had been advised to follow-up to discuss lab and CT results but no appointment made. Son and patient not picking-up phone. I will send letter with results.

## 2012-01-10 ENCOUNTER — Telehealth: Payer: Self-pay | Admitting: Family Medicine

## 2012-01-10 ENCOUNTER — Ambulatory Visit: Payer: Self-pay | Admitting: Family Medicine

## 2012-01-10 MED ORDER — FLUCONAZOLE 150 MG PO TABS: 150.0000 mg | ORAL_TABLET | Freq: Once | ORAL | Status: DC

## 2012-01-10 NOTE — Telephone Encounter (Signed)
She thinks Diflucan did not help fully with symptoms and she needs longer course.  Rx for 2 more tablets sent. She was advised to follow-up if her symptoms are not improved after this course to see if there is anything else going on.

## 2012-01-10 NOTE — Telephone Encounter (Signed)
Pt cannot make her appt today and is asking to speak with Dr Verdie Drown - did not want to reschedule until she talks with Dr Verdie Drown

## 2012-01-31 ENCOUNTER — Telehealth: Payer: Self-pay | Admitting: Family Medicine

## 2012-01-31 NOTE — Telephone Encounter (Signed)
Spoke with sister and she states she went to appointment with heart doctor today but due to a mix up she was not seen. Since she has gotten home is wheezing and very congested. No fever. Advised  she should go to Urgent Care  or ED if she is having trouble breathing  and wheezing.  She will try to get son to take her and will call back tomorrow AM if she does not go today.

## 2012-01-31 NOTE — Telephone Encounter (Signed)
Sister is calling because Abigail Wallace is very sick and too weak to come to doctor office.  She is coughing up thick green mucus.  Lelon Frohlich would like to speak to the nurse.

## 2012-02-07 ENCOUNTER — Ambulatory Visit (HOSPITAL_COMMUNITY): Payer: Self-pay | Admitting: Psychiatry

## 2012-02-11 ENCOUNTER — Other Ambulatory Visit (HOSPITAL_COMMUNITY): Payer: Self-pay | Admitting: *Deleted

## 2012-02-11 DIAGNOSIS — F200 Paranoid schizophrenia: Secondary | ICD-10-CM

## 2012-02-11 MED ORDER — DIVALPROEX SODIUM ER 500 MG PO TB24: 500.0000 mg | ORAL_TABLET | Freq: Every day | ORAL | Status: DC

## 2012-02-14 ENCOUNTER — Telehealth (HOSPITAL_COMMUNITY): Payer: Self-pay

## 2012-02-14 ENCOUNTER — Ambulatory Visit (HOSPITAL_COMMUNITY): Payer: Self-pay | Admitting: Psychiatry

## 2012-02-14 DIAGNOSIS — F200 Paranoid schizophrenia: Secondary | ICD-10-CM

## 2012-02-14 MED ORDER — OLANZAPINE 15 MG PO TABS: ORAL_TABLET | ORAL | Status: DC

## 2012-02-14 NOTE — Telephone Encounter (Signed)
Patient missed appointment today do to not feeling well.  She wants to come off from her medication however her son does not feel that she should come off from the medication.  I also feel that patient will be decompensated if she stopped the medication.  She like to discuss this issue further on her next appointment.  I recommend to continue olanzapine at present does.  Her insurance does not allow more than 30 pills a day.  I will change direction of olanzapine to 15 mg half tablet twice a day.  Direction explained to patient and her son.

## 2012-02-15 ENCOUNTER — Encounter: Payer: Self-pay | Admitting: Family Medicine

## 2012-02-15 ENCOUNTER — Ambulatory Visit (INDEPENDENT_AMBULATORY_CARE_PROVIDER_SITE_OTHER): Payer: Medicare Other | Admitting: Family Medicine

## 2012-02-15 ENCOUNTER — Telehealth (HOSPITAL_COMMUNITY): Payer: Self-pay

## 2012-02-15 VITALS — BP 153/90 | HR 70 | Temp 97.6°F | Ht 66.0 in | Wt 204.0 lb

## 2012-02-15 DIAGNOSIS — J40 Bronchitis, not specified as acute or chronic: Secondary | ICD-10-CM | POA: Insufficient documentation

## 2012-02-15 MED ORDER — AZITHROMYCIN 250 MG PO TABS: ORAL_TABLET | ORAL | Status: DC

## 2012-02-15 MED ORDER — TIOTROPIUM BROMIDE MONOHYDRATE 18 MCG IN CAPS: 18.0000 ug | ORAL_CAPSULE | Freq: Every day | RESPIRATORY_TRACT | Status: DC

## 2012-02-15 MED ORDER — ALBUTEROL SULFATE HFA 108 (90 BASE) MCG/ACT IN AERS: 2.0000 | INHALATION_SPRAY | Freq: Four times a day (QID) | RESPIRATORY_TRACT | Status: DC | PRN

## 2012-02-15 NOTE — Progress Notes (Signed)
  Subjective:    Patient ID: Abigail Wallace, female    DOB: 04-15-1945, 67 y.o.   MRN: [id number]  HPI  Patient here for same-day visit, accompanied by her son Abigail Wallace.  Patient with c/o progressively worsening cough with thick phlegm over the past three weeks, has been getting more productive in the past week or so.  No objective fevers, but has had subjective warmth/chills.  Cough with paroxysms at nighttime.  Has used otc cough medicines without relief.  Son reports that pateint continues to smoke 1/2-1 ppd cigarettes, goes outside to smoke, even in the bitter cold we have been having this week.  Her other son Abigail Wallace) had a violent flu-like illness a week or so before the patient became ill.  Patient has been prescribed Spiriva and albuterol before for COPD, however unsure why the Spiriva had been discontinued.  She ran out of albuterol and has no more.  Of note, patient and son express concern about patient's persistent chronic bilateral leg and back pain, as well as urinary frequency (voids every 45 minutes or so).  Reports no changes in these problems at this time.   Review of Systems No objective fevers, no dyspnea, no chest discomfort.  Patient lives alone and performs own ADLs, son Abigail Wallace comes over to help frequently.     Objective:   Physical Exam Well appearing, in wheelchair, no apparent distress HEENT neck supple. TMs clear bilaterally. Some mild anterior cervical adenopathy. Bilateral maxillary sinus tenderness.  Nasal mucosa boggy and erythematous, without purulent or bloody nasal discharge.  Injected oropharynx without exudate.   COR Regular S1S2, no extra sounds PULM Clear bilaterally, no rales or wheezes. Pulse oximetry done and noted in vitals section of the chart.        Assessment & Plan:

## 2012-02-15 NOTE — Patient Instructions (Addendum)
It was a pleasure to see you today.  I believe you have a bronchitis causing your cough and malaise.   Azithromycin 250mg  tablet, 2 tablets today (TOGETHER), then 1 tablet daily until gone (4 more days).  Mucinex 600mg  1 tablet by mouth every 12 hours, with plenty of liquids. To thin out secretions.   A home thermometer for use when you feel warm/feverish.   Call/come back if you are not improving or worsening.   I sent refills of your inhaler medicines to your pharmacy, use the albuterol inhaler 2 puffs every 4-6 hours as needed for shortness of breath/cough.

## 2012-02-15 NOTE — Assessment & Plan Note (Signed)
Patient with three weeks of worsening productive cough; no findings to support acute bacterial sinusitis, but given paroxysms of worsening cough in this smoker, I am inclined to treat with a macrolide for possible bacterial bronchitis versus atypical pneumonia.  Discussed smoking cessation.  Patient is to follow up with primary physician regarding her other chronic concerns.

## 2012-02-19 ENCOUNTER — Ambulatory Visit: Payer: Self-pay

## 2012-02-21 ENCOUNTER — Telehealth: Payer: Self-pay | Admitting: *Deleted

## 2012-02-21 ENCOUNTER — Other Ambulatory Visit (HOSPITAL_COMMUNITY): Payer: Self-pay | Admitting: Psychiatry
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
## 2012-02-22 NOTE — Telephone Encounter (Signed)
Abigail Wallace - please clarify that the patient wants to switch to another medical provider even though Dr. Lindell Noe is not available?  Dr. Erin Hearing - do you need to review chart / talk to Dr. Verdie Drown to help determine how best to manage?  If not, Dr. Verdie Drown can meet with me briefly to discuss how to proceed.

## 2012-02-22 NOTE — Telephone Encounter (Signed)
Patient does want to switch to another medical provider and is aware that Dr. Lindell Noe is not available.  Nolene Ebbs, RN

## 2012-02-23 NOTE — Telephone Encounter (Signed)
Dr. Gwenlyn Saran, I am fine with her switching providers. I do not think calling her will be helpful. If you would like me to meet with you, please let me know.

## 2012-02-25 ENCOUNTER — Telehealth: Payer: Self-pay | Admitting: Family Medicine

## 2012-02-25 NOTE — Telephone Encounter (Addendum)
Spoke briefly with Dr. Verdie Drown.  She knows the patient and I do not.  Based on the patient's report of her experience, I think a phone call would be appropriate however I defer to Dr. Marvene Staff judgment as to the utility of this.

## 2012-02-25 NOTE — Telephone Encounter (Signed)
Will call patient to discuss PCP options.   Nolene Ebbs, RN

## 2012-02-25 NOTE — Telephone Encounter (Signed)
I apologized to patient that she felt like I hurt her.  Patient said "ok" and "goodbye".

## 2012-02-28 ENCOUNTER — Ambulatory Visit (HOSPITAL_COMMUNITY): Payer: Self-pay | Admitting: Psychiatry

## 2012-03-06 ENCOUNTER — Encounter (HOSPITAL_COMMUNITY): Payer: Self-pay | Admitting: Psychiatry

## 2012-03-06 ENCOUNTER — Ambulatory Visit (INDEPENDENT_AMBULATORY_CARE_PROVIDER_SITE_OTHER): Payer: Medicare Other | Admitting: Psychiatry

## 2012-03-06 VITALS — BP 130/80 | HR 72 | Wt 204.0 lb

## 2012-03-06 DIAGNOSIS — Z79899 Other long term (current) drug therapy: Secondary | ICD-10-CM

## 2012-03-06 DIAGNOSIS — F200 Paranoid schizophrenia: Secondary | ICD-10-CM

## 2012-03-06 MED ORDER — RISPERIDONE 1 MG PO TABS: 1.0000 mg | ORAL_TABLET | Freq: Every day | ORAL | Status: DC

## 2012-03-06 MED ORDER — DIVALPROEX SODIUM ER 500 MG PO TB24: 500.0000 mg | ORAL_TABLET | Freq: Every day | ORAL | Status: DC

## 2012-03-06 MED ORDER — TRIHEXYPHENIDYL HCL 2 MG PO TABS: 2.0000 mg | ORAL_TABLET | Freq: Every day | ORAL | Status: DC

## 2012-03-06 NOTE — Telephone Encounter (Signed)
Returned call to patient and left message to call our office back.  Shaquanna Lycan Ann, RN  
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
## 2012-03-07 LAB — VALPROIC ACID LEVEL: Valproic Acid Lvl: 27.4 ug/mL — ABNORMAL LOW (ref 50.0–100.0)

## 2012-03-10 NOTE — Telephone Encounter (Signed)
Called patient and discussed PCP change request.  Will change PCP to Dr. Marily Memos.  Appt scheduled for 03/20/12 at 1:30 pm to meet new MD and check "bump" on leg.  Patient unable to come in this week due to transportation issues.  Nolene Ebbs, RN

## 2012-03-13 ENCOUNTER — Telehealth (HOSPITAL_COMMUNITY): Payer: Self-pay | Admitting: *Deleted

## 2012-03-13 NOTE — Telephone Encounter (Signed)
Patient's son called.  Patient is sleeping.  Patient requested that her medicines be changed on her last visit.  We stopped Zyprexa to start Risperdal.  She's taking 1 mg.  I recommend to take 2 mg at bedtime and let us know if still does not sleep better.

## 2012-03-13 NOTE — Telephone Encounter (Signed)
Son left [id number] is not sleeping since medication changes last week and has seen changes in her as well. Requests call from provider,

## 2012-03-19 ENCOUNTER — Telehealth (HOSPITAL_COMMUNITY): Payer: Self-pay | Admitting: *Deleted

## 2012-03-19 MED ORDER — RISPERIDONE 2 MG PO TABS: 2.0000 mg | ORAL_TABLET | Freq: Every day | ORAL | Status: DC

## 2012-03-19 NOTE — Telephone Encounter (Signed)
Risperdal increased by Dr.Arfeen to 2 mg at bedtime 03/13/12. Original RX given 03/06/12 was for 1 mg at bedtime. Son requesting new RX with new dosage

## 2012-03-19 NOTE — Telephone Encounter (Signed)
Son requested a prescription of Risperdal.  Dose was increased to 2 mg.  We will authorized 2 mg Risperdal.

## 2012-03-19 NOTE — Addendum Note (Signed)
Addended by: Berniece Andreas T on: 03/19/2012 12:22 PM   Modules accepted: Orders

## 2012-03-20 ENCOUNTER — Encounter: Payer: Self-pay | Admitting: Family Medicine

## 2012-03-20 ENCOUNTER — Ambulatory Visit (INDEPENDENT_AMBULATORY_CARE_PROVIDER_SITE_OTHER): Payer: Medicare Other | Admitting: Family Medicine

## 2012-03-20 VITALS — BP 172/88 | HR 71 | Temp 97.3°F | Ht 66.0 in | Wt 209.9 lb

## 2012-03-20 DIAGNOSIS — E7800 Pure hypercholesterolemia, unspecified: Secondary | ICD-10-CM

## 2012-03-20 DIAGNOSIS — J449 Chronic obstructive pulmonary disease, unspecified: Secondary | ICD-10-CM

## 2012-03-20 DIAGNOSIS — L989 Disorder of the skin and subcutaneous tissue, unspecified: Secondary | ICD-10-CM

## 2012-03-20 DIAGNOSIS — R739 Hyperglycemia, unspecified: Secondary | ICD-10-CM

## 2012-03-20 DIAGNOSIS — F209 Schizophrenia, unspecified: Secondary | ICD-10-CM

## 2012-03-20 DIAGNOSIS — R7309 Other abnormal glucose: Secondary | ICD-10-CM

## 2012-03-20 DIAGNOSIS — E119 Type 2 diabetes mellitus without complications: Secondary | ICD-10-CM | POA: Insufficient documentation

## 2012-03-20 DIAGNOSIS — IMO0001 Reserved for inherently not codable concepts without codable children: Secondary | ICD-10-CM

## 2012-03-20 DIAGNOSIS — R030 Elevated blood-pressure reading, without diagnosis of hypertension: Secondary | ICD-10-CM

## 2012-03-20 LAB — COMPREHENSIVE METABOLIC PANEL
ALT: <8 U/L (ref 0–35)
AST: 10 U/L (ref 0–37)
Albumin: 4.1 g/dL (ref 3.5–5.2)
BUN: 16 mg/dL (ref 6–23)
Calcium: 9.3 mg/dL (ref 8.4–10.5)
Chloride: 105 mEq/L (ref 96–112)
Potassium: 4 mEq/L (ref 3.5–5.3)
Sodium: 139 mEq/L (ref 135–145)
Total Protein: 6.8 g/dL (ref 6.0–8.3)

## 2012-03-20 MED ORDER — HYDROCHLOROTHIAZIDE 12.5 MG PO CAPS: 12.5000 mg | ORAL_CAPSULE | Freq: Every day | ORAL | Status: DC

## 2012-03-20 MED ORDER — DOXYCYCLINE HYCLATE 100 MG PO TABS: 100.0000 mg | ORAL_TABLET | Freq: Two times a day (BID) | ORAL | Status: DC

## 2012-03-20 NOTE — Assessment & Plan Note (Signed)
A1c today.  

## 2012-03-20 NOTE — Assessment & Plan Note (Signed)
Ambulatory sats normal today Continue spiriva No need for supplemental O2

## 2012-03-20 NOTE — Patient Instructions (Addendum)
Your blood pressure continues to be elevated. I would like for you to start HCTZ which will help with your blood pressure and heart function.  Please start taking the Doxy for the infection on your bottom Please continue taking your breathing medciations and stop smoking Please come back in 2 weeks for a blood pressure check I will recheck labs today to ensure you do not have diabetes or other metabolic problem.

## 2012-03-20 NOTE — Assessment & Plan Note (Signed)
Direct LDL today CMet today

## 2012-03-20 NOTE — Progress Notes (Signed)
Abigail Wallace is a 67 y.o. female who presents to [facility] today for leg pain  Leg pain on L started 1+ mo ago. Started out as an ingrown hair and minimally painful. Hair pulled out about 1 month ago, and not healing well. Has applied nitrostat w/o benefit.   Sore on buttocks: present off and on for 3 mo. Placing cream on sores w/o benefit. Painful/sore. Uses a walker at home but in wheelchair for appointment. Denies fevers, night sweats, rash.   COPD: improving since previous appt. Using spiriva daily. SOB from time to time. Does not use oxygen. No fevers, or increase phlegm production   The following portions of the patient's history were reviewed and updated as appropriate: allergies, current medications, past medical history, family and social history, and problem list.  Patient is a smoker (1/2ppd).   Past Medical History  Diagnosis Date  . Cystitis     Frequent episodes.   . Gout   . Coronary artery disease   . COPD (chronic obstructive pulmonary disease)   . GERD (gastroesophageal reflux disease)   . Mental disorder   . Psychosis     ROS as above otherwise neg.    Medications reviewed. Current Outpatient Prescriptions  Medication Sig Dispense Refill  . albuterol (PROVENTIL HFA;VENTOLIN HFA) 108 (90 BASE) MCG/ACT inhaler Inhale 2 puffs into the lungs every 6 (six) hours as needed.  1 Inhaler  3  . allopurinol (ZYLOPRIM) 100 MG tablet Take 100 mg by mouth daily.       Marland Kitchen aspirin EC 81 MG tablet Take 81 mg by mouth daily.        . divalproex (DEPAKOTE ER) 500 MG 24 hr tablet Take 1 tablet (500 mg total) by mouth daily.  30 tablet  0  . doxycycline (VIBRA-TABS) 100 MG tablet Take 1 tablet (100 mg total) by mouth 2 (two) times daily.  20 tablet  0  . hydrochlorothiazide (MICROZIDE) 12.5 MG capsule Take 1 capsule (12.5 mg total) by mouth daily.  30 capsule  3  . losartan (COZAAR) 50 MG tablet Take 50 mg by mouth daily.      . metoprolol tartrate (LOPRESSOR) 25 MG tablet Take 25 mg by  mouth Twice daily.      . Misc. Devices (WALKER) MISC 1 Units by Does not apply route daily.  1 each  0  . Misc. Devices [facility]) MISC 1 Units by Does not apply route daily.  1 each  0  . Nitroglycerin (NITROSTAT SL) Place 1 tablet under the tongue every 5 (five) minutes x 3 doses as needed. For chest      . nystatin cream (MYCOSTATIN) Apply topically 2 (two) times daily.  30 g  0  . risperiDONE (RISPERDAL) 2 MG tablet Take 1 tablet (2 mg total) by mouth at bedtime.  30 tablet  0  . rosuvastatin (CRESTOR) 5 MG tablet Take 5 mg by mouth every evening.       . tiotropium (SPIRIVA HANDIHALER) 18 MCG inhalation capsule Place 1 capsule (18 mcg total) into inhaler and inhale daily. Two puffs in handihaler daily  30 capsule  2  . trihexyphenidyl (ARTANE) 2 MG tablet Take 1 tablet (2 mg total) by mouth at bedtime.  30 tablet  0   No current facility-administered medications for this visit.    Exam: BP 172/88  Pulse 71  Temp(Src) 97.3 F (36.3 C) (Oral)  Ht 5\' 6"  (1.676 m)  Wt 209 lb 14.4 oz (95.21 kg)  BMI 33.89 kg/m2  SpO2 95% Gen: Well NAD HEENT: EOMI,  MMM Lungs: CTABL Nl WOB Heart: RRR no MRG Abd: NABS, NT, ND Exts: trace LE edema.  Skin: erythematous indurated pustular lesions of the R buttocks around hari follicles.  L posterior lateral skin lesion that has scabbed over and is w/o erythema or discharge.   O2 sats on ambulation today 95% on RA  Results for orders placed in visit on 03/20/12 (from the past 72 hour(s))  LDL CHOLESTEROL, DIRECT     Status: None   Collection Time    03/20/12  3:10 PM      Result Value Range   Direct LDL 80     Comment: ATP III Classification (LDL):           < 100        mg/dL         Optimal          100 - 129     mg/dL         Near or Above Optimal          130 - 159     mg/dL         Borderline High          160 - 189     mg/dL         High           > 190        mg/dL         Very High        COMPREHENSIVE METABOLIC PANEL     Status:  None   Collection Time    03/20/12  3:10 PM      Result Value Range   Sodium 139  135 - 145 mEq/L   Potassium 4.0  3.5 - 5.3 mEq/L   Chloride 105  96 - 112 mEq/L   CO2 22  19 - 32 mEq/L   Glucose, Bld 88  70 - 99 mg/dL   BUN 16  6 - 23 mg/dL   Creat 0.78  0.50 - 1.10 mg/dL   Total Bilirubin 0.3  0.3 - 1.2 mg/dL   Alkaline Phosphatase 74  39 - 117 U/L   AST 10  0 - 37 U/L   ALT <8  0 - 35 U/L   Total Protein 6.8  6.0 - 8.3 g/dL   Albumin 4.1  3.5 - 5.2 g/dL   Calcium 9.3  8.4 - 10.5 mg/dL  POCT GLYCOSYLATED HEMOGLOBIN (HGB A1C)     Status: None   Collection Time    03/20/12  3:11 PM      Result Value Range   Hemoglobin A1C 5.1

## 2012-03-20 NOTE — Assessment & Plan Note (Signed)
Healing leg sore.  No furthter intervention at this time.

## 2012-03-20 NOTE — Assessment & Plan Note (Signed)
Elevated today and at several previous exams.  Continue Metoprolol Adding HCTZ as likely to benefit pts CHF/LE edema Pt to return in 2 wks for BP check

## 2012-03-20 NOTE — Assessment & Plan Note (Addendum)
folliculitis w/ possible cellulitis of the buttocks Doxy 100mg  BID 10 days

## 2012-03-21 NOTE — Assessment & Plan Note (Signed)
Managed by [facility], Dr Adele Schilder Pt likes current therapy

## 2012-03-25 ENCOUNTER — Telehealth: Payer: Self-pay | Admitting: Family Medicine

## 2012-03-25 NOTE — Telephone Encounter (Signed)
Son is calling because his mom has a place on her bottom that Dr. Marily Memos looked at last week that is getting worse and is now oozing pus.  Also, her oxygen level was at 95% and he thinks that is dangerous because his son and other people he knows have been hospitalized when it is that low.  He would like the triage nurse to call him back to discuss further.

## 2012-03-25 NOTE — Telephone Encounter (Signed)
Advised son and patient that 95 % O 2 Sat is not bad . The area on bottom is now size of a quarter , superficial opening and oozing pus like drainage. Using Nystatin cream that they had on hand and taking antibiotic as prescribed. Will forward to Dr.Merrell.

## 2012-03-26 NOTE — Telephone Encounter (Signed)
Called and spoke to Pts son  Area on bottom previously now w/ skin pulled back in a quarter sized area and w/ clear discharge. No purulent discharge, fever, rash, altered mental status. Area of pulled skin is burning. Continues to use the nystatin cream and ABX. Pt is diapered and spends the majority of the day sitting.   SKin is likely becoming macerated and has sheered off. Counseled family to keep area dry and keep pressure off sore either by having pt lie down or place cushions in sucha way that pressure is off her sore. Pt to continue the ABX and DC the nystatin. Precautions given. If this doesn't heal will need evaluation for likely wound consult.   Linna Darner, MD Family Medicine PGY-2 03/26/2012, 12:41 PM

## 2012-04-03 ENCOUNTER — Ambulatory Visit (HOSPITAL_COMMUNITY): Payer: Self-pay | Admitting: Psychiatry

## 2012-04-03 ENCOUNTER — Telehealth: Payer: Self-pay | Admitting: Family Medicine

## 2012-04-03 NOTE — Telephone Encounter (Signed)
Pt is asking to speak to Dr Marily Memos about her BP - she was told to talk to him if she has any problems

## 2012-04-03 NOTE — Telephone Encounter (Signed)
Pt wanted to give you these BP readings-3/10 153/90; 3/11 137/83; 3/12 144/82; 3/13 105/67; 3/14 138/80; 3/17 128/81; and 3/18 125/74. She also said to say "hello".

## 2012-04-08 ENCOUNTER — Other Ambulatory Visit (HOSPITAL_COMMUNITY): Payer: Self-pay | Admitting: Psychiatry

## 2012-04-08 DIAGNOSIS — F200 Paranoid schizophrenia: Secondary | ICD-10-CM

## 2012-04-10 ENCOUNTER — Encounter: Payer: Self-pay | Admitting: Family Medicine

## 2012-04-10 ENCOUNTER — Ambulatory Visit (INDEPENDENT_AMBULATORY_CARE_PROVIDER_SITE_OTHER): Payer: Medicare Other | Admitting: Family Medicine

## 2012-04-10 VITALS — BP 135/99 | HR 70 | Temp 99.4°F

## 2012-04-10 DIAGNOSIS — L8991 Pressure ulcer of unspecified site, stage 1: Secondary | ICD-10-CM

## 2012-04-10 DIAGNOSIS — R300 Dysuria: Secondary | ICD-10-CM

## 2012-04-10 DIAGNOSIS — L89309 Pressure ulcer of unspecified buttock, unspecified stage: Secondary | ICD-10-CM | POA: Insufficient documentation

## 2012-04-10 DIAGNOSIS — L89311 Pressure ulcer of right buttock, stage 1: Secondary | ICD-10-CM

## 2012-04-10 LAB — POCT URINALYSIS DIPSTICK
Ketones, UA: NEGATIVE
Protein, UA: 100
Spec Grav, UA: 1.025
Urobilinogen, UA: 0.2
pH, UA: 6

## 2012-04-10 LAB — POCT UA - MICROSCOPIC ONLY

## 2012-04-10 NOTE — Patient Instructions (Signed)
It was good to see you today! You have an early pressure sore on your bottom.  You should get a donut seat cushion to help get some of the weight off of your bottom when you sit and you should try to get up and move as much as possible.

## 2012-04-10 NOTE — Progress Notes (Signed)
Patient ID: Abigail Wallace, female   DOB: [birth year], 67 y.o.   MRN: [id number] Subjective: The patient is a 67 y.o. year old female who presents today for 69 complaints.  1. Dysuria: x5 days.  No fevers, chills, back pain.  Has chronic frequency issues.  2. Sores: Reports she has noticed sores on buttocks for last several days.  Not draining.  Have not been enlarging.  No systemic symptoms.  Is no longer getting up and moving around as much as she used to as she no longer goes outside to smoke.  Patient's past medical, social, and family history were reviewed and updated as appropriate. History  Substance Use Topics  . Smoking status: Current Every Day Smoker -- 0.50 packs/day    Types: Cigarettes  . Smokeless tobacco: Not on file  . Alcohol Use: No   Objective:  Filed Vitals:   04/10/12 1121  BP: 135/99  Pulse: 70  Temp: 99.4 F (37.4 C)   Gen: NAD, morbidly obese Abd: SNTND Back: No CVA tenderness Skin: Right buttock has grade 1 pressure sore present.  No ulceration/loss of skin.  No other lesions identified.  Assessment/Plan: Dysuria: No current evidence of UTI.  Will send for cx due to recurrent UTis.  No treatment at this time.  Please also see individual problems in problem list for problem-specific plans.

## 2012-04-10 NOTE — Assessment & Plan Note (Signed)
Rec offloading.  F/u as needed.

## 2012-04-11 ENCOUNTER — Telehealth: Payer: Self-pay | Admitting: Family Medicine

## 2012-04-11 LAB — URINE CULTURE: Colony Count: 2000

## 2012-04-11 NOTE — Telephone Encounter (Signed)
Slightly abnormal will forward to MD .Clinton Sawyer, [name]

## 2012-04-11 NOTE — Telephone Encounter (Signed)
I called to notify her of urinalysis results not consistent with infection. Culture is pending.  She told me I was lying and that she needs medication sent in. I reiterated above and then hung up the phone.

## 2012-04-11 NOTE — Telephone Encounter (Signed)
Patient is calling for the results of her urinalysis.

## 2012-04-11 NOTE — Telephone Encounter (Signed)
Sorry, I meant to say she hung up the phone. : ) I did not hang up the phone on her.

## 2012-04-12 ENCOUNTER — Other Ambulatory Visit (HOSPITAL_COMMUNITY): Payer: Self-pay | Admitting: Psychiatry

## 2012-04-12 DIAGNOSIS — F200 Paranoid schizophrenia: Secondary | ICD-10-CM

## 2012-04-14 NOTE — Telephone Encounter (Signed)
Called and left a VM for pt to call the office UA w/ blood. Significant workup in the past for this including cystoscopy which was nml. Pt pt of [facility] and recommend going to their office if still concerned.  UCX w/ only 2,000 colonies (100,000 needed to confirm UTI).  No UTI.   Linna Darner, MD Family Medicine PGY-2 04/14/2012, 1:59 PM

## 2012-04-14 NOTE — Telephone Encounter (Signed)
Will fwd to Dr. Marily Memos.Melford Tullier L

## 2012-04-14 NOTE — Telephone Encounter (Signed)
Is insisting that there is blood in her urine and wants to know if the culture is back and when can she get her medicine.  Wants Dr Marily Memos to call her.

## 2012-04-16 ENCOUNTER — Encounter (HOSPITAL_COMMUNITY): Payer: Self-pay | Admitting: Psychiatry

## 2012-04-16 ENCOUNTER — Ambulatory Visit (INDEPENDENT_AMBULATORY_CARE_PROVIDER_SITE_OTHER): Payer: Medicare Other | Admitting: Psychiatry

## 2012-04-16 VITALS — BP 140/70 | HR 88

## 2012-04-16 DIAGNOSIS — F200 Paranoid schizophrenia: Secondary | ICD-10-CM

## 2012-04-16 MED ORDER — DIVALPROEX SODIUM ER 500 MG PO TB24: ORAL_TABLET | ORAL | Status: DC

## 2012-04-16 MED ORDER — RISPERIDONE 2 MG PO TABS: 2.0000 mg | ORAL_TABLET | Freq: Every day | ORAL | Status: DC

## 2012-04-16 MED ORDER — TRIHEXYPHENIDYL HCL 2 MG PO TABS: ORAL_TABLET | ORAL | Status: DC
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
## 2012-05-01 ENCOUNTER — Encounter (HOSPITAL_COMMUNITY): Payer: Self-pay | Admitting: Emergency Medicine

## 2012-05-01 ENCOUNTER — Emergency Department (HOSPITAL_COMMUNITY)
Admission: EM | Admit: 2012-05-01 | Discharge: 2012-05-01 | Disposition: A | Discharge status: Home / Self Care | Payer: Medicare Other | Attending: Emergency Medicine | Admitting: Emergency Medicine

## 2012-05-01 DIAGNOSIS — J449 Chronic obstructive pulmonary disease, unspecified: Secondary | ICD-10-CM | POA: Insufficient documentation

## 2012-05-01 DIAGNOSIS — M109 Gout, unspecified: Secondary | ICD-10-CM | POA: Insufficient documentation

## 2012-05-01 DIAGNOSIS — L089 Local infection of the skin and subcutaneous tissue, unspecified: Secondary | ICD-10-CM | POA: Insufficient documentation

## 2012-05-01 DIAGNOSIS — Z79899 Other long term (current) drug therapy: Secondary | ICD-10-CM | POA: Insufficient documentation

## 2012-05-01 DIAGNOSIS — N3090 Cystitis, unspecified without hematuria: Secondary | ICD-10-CM | POA: Insufficient documentation

## 2012-05-01 DIAGNOSIS — I2510 Atherosclerotic heart disease of native coronary artery without angina pectoris: Secondary | ICD-10-CM | POA: Insufficient documentation

## 2012-05-01 DIAGNOSIS — Z8719 Personal history of other diseases of the digestive system: Secondary | ICD-10-CM | POA: Insufficient documentation

## 2012-05-01 DIAGNOSIS — Y929 Unspecified place or not applicable: Secondary | ICD-10-CM | POA: Insufficient documentation

## 2012-05-01 DIAGNOSIS — Z8659 Personal history of other mental and behavioral disorders: Secondary | ICD-10-CM | POA: Insufficient documentation

## 2012-05-01 DIAGNOSIS — J4489 Other specified chronic obstructive pulmonary disease: Secondary | ICD-10-CM | POA: Insufficient documentation

## 2012-05-01 DIAGNOSIS — W57XXXA Bitten or stung by nonvenomous insect and other nonvenomous arthropods, initial encounter: Secondary | ICD-10-CM

## 2012-05-01 DIAGNOSIS — Y939 Activity, unspecified: Secondary | ICD-10-CM | POA: Insufficient documentation

## 2012-05-01 DIAGNOSIS — Z7982 Long term (current) use of aspirin: Secondary | ICD-10-CM | POA: Insufficient documentation

## 2012-05-01 MED ORDER — DOXYCYCLINE HYCLATE 100 MG PO CAPS: 100.0000 mg | ORAL_CAPSULE | Freq: Two times a day (BID) | ORAL | Status: DC

## 2012-05-01 NOTE — ED Notes (Signed)
[id number] Expected date:<BR> Expected time:<BR> Means of arrival:<BR> Comments:<BR> Psych hx, c/o bite by a tick, no reaction

## 2012-05-01 NOTE — ED Provider Notes (Signed)
History     CSN: [id number]  Arrival date & time 05/01/12  1500   First MD Initiated Contact with Patient 05/01/12 1518      Chief Complaint  Patient presents with  . Insect Bite    (Consider location/radiation/quality/duration/timing/severity/associated sxs/prior treatment) The history is provided by the patient.   patient was reportedly being cleaned by a caregiver at home because she had an accident on herself. While she was being cleaned in the shower they found a tick attached to her posterior right leg. She comes in here to be evaluated and get some medicine for it. She is without new complaints. Patient does not know when the tick attached, but she been outside earlier today.  Past Medical History  Diagnosis Date  . Cystitis     Frequent episodes.   . Gout   . Coronary artery disease   . COPD (chronic obstructive pulmonary disease)   . GERD (gastroesophageal reflux disease)   . Mental disorder   . Psychosis     Past Surgical History  Procedure Laterality Date  . Pacemaker placement  09/28/2005    2/2 SSS?  Marland Kitchen Cholecystectomy    . Tubal ligation    . Cardiac catheterization  2007    Non-critical.   . Persantine stress test  05/01/2010    EF 66%. Normal LV sys fx. Unchanged from previous studies.   . Insert / replace / remove pacemaker  2007    [place]    Family History  Problem Relation Age of Onset  . Diabetes type II Son   . Heart disease Mother   . Kidney disease Mother   . Heart disease Father   . Kidney disease Father     History  Substance Use Topics  . Smoking status: Current Every Day Smoker -- 0.50 packs/day    Types: Cigarettes  . Smokeless tobacco: Not on file  . Alcohol Use: No    OB History   Grav Para Term Preterm Abortions TAB SAB Ect Mult Living                  Review of Systems  Constitutional: Negative for fever and chills.  Musculoskeletal: Negative for back pain.  Skin: Positive for wound. Negative for color change,  pallor and rash.  Neurological: Negative for headaches.    Allergies  Aripiprazole; Benadryl; Codeine; Cogentin; Haloperidol lactate; Latuda; Zyprexa; and Latex  Home Medications   Current Outpatient Rx  Name  Route  Sig  Dispense  Refill  . albuterol (PROVENTIL HFA;VENTOLIN HFA) 108 (90 BASE) MCG/ACT inhaler   Inhalation   Inhale 2 puffs into the lungs every 6 (six) hours as needed.   1 Inhaler   3   . allopurinol (ZYLOPRIM) 100 MG tablet   Oral   Take 100 mg by mouth daily.          Marland Kitchen aspirin EC 81 MG tablet   Oral   Take 81 mg by mouth daily.           . divalproex (DEPAKOTE ER) 500 MG 24 hr tablet      take 1 tablet by mouth once daily   30 tablet   1   . doxycycline (VIBRA-TABS) 100 MG tablet   Oral   Take 1 tablet (100 mg total) by mouth 2 (two) times daily.   20 tablet   0   . doxycycline (VIBRAMYCIN) 100 MG capsule   Oral   Take 1 capsule (100 mg  total) by mouth 2 (two) times daily.   14 capsule   0   . hydrochlorothiazide (MICROZIDE) 12.5 MG capsule   Oral   Take 1 capsule (12.5 mg total) by mouth daily.   30 capsule   3   . losartan (COZAAR) 50 MG tablet   Oral   Take 50 mg by mouth daily.         . metoprolol tartrate (LOPRESSOR) 25 MG tablet   Oral   Take 25 mg by mouth Twice daily.         . Misc. Devices [facility]) MISC   Does not apply   1 Units by Does not apply route daily.   1 each   0     Please provide rolling walker for patient   . Misc. Devices [facility]) MISC   Does not apply   1 Units by Does not apply route daily.   1 each   0     Please provide appropriate wheelchair.   . Nitroglycerin (NITROSTAT SL)   Sublingual   Place 1 tablet under the tongue every 5 (five) minutes x 3 doses as needed. For chest         . nystatin cream (MYCOSTATIN)   Topical   Apply topically 2 (two) times daily.   30 g   0   . risperiDONE (RISPERDAL) 2 MG tablet   Oral   Take 1 tablet (2 mg total) by mouth at bedtime.   30  tablet   1   . rosuvastatin (CRESTOR) 5 MG tablet   Oral   Take 5 mg by mouth every evening.          . tiotropium (SPIRIVA HANDIHALER) 18 MCG inhalation capsule   Inhalation   Place 1 capsule (18 mcg total) into inhaler and inhale daily. Two puffs in handihaler daily   30 capsule   2   . trihexyphenidyl (ARTANE) 2 MG tablet      take 1 tablet by mouth at bedtime   30 tablet   1     BP 129/68  Pulse 70  Temp(Src) 98.1 F (36.7 C) (Oral)  Resp 16  SpO2 100%  Physical Exam  Constitutional: She is oriented to person, place, and time. She appears well-developed and well-nourished.  HENT:  Head: Normocephalic and atraumatic.  Cardiovascular: Normal rate and regular rhythm.   Pulmonary/Chest: Effort normal and breath sounds normal.  Musculoskeletal: Normal range of motion.  Neurological: She is alert and oriented to person, place, and time.  Skin: Skin is warm. There is erythema.  5 mm erythematous area to right posterior thigh. Central ecchymotic area. No foreign body seen. No fluctuance.    ED Course  Procedures (including critical care time)  Labs Reviewed - No data to display No results found.   1. Tick bite       MDM  Patient with tick exposure. No rash, but just was removed today. Patient has home health at home and will be given a prescription for doxycycline in case a rash develops. This will be given because she has difficulty getting to her health care provider.        Jasper Riling. Alvino Chapel, MD 05/01/12 1550

## 2012-05-01 NOTE — ED Notes (Signed)
Pt here from home for c/o tick bite  On rt leg no abnormally note

## 2012-05-30 ENCOUNTER — Telehealth: Payer: Self-pay | Admitting: Family Medicine

## 2012-05-30 NOTE — Telephone Encounter (Signed)
Called and spoke to pt by phone. Pt would like to see me in 2 wks. Instructed her about my only available clinic day. Pt to call on Monday to be added to my schedule.  Linna Darner, MD Family Medicine PGY-2 05/30/2012, 9:00 PM

## 2012-05-30 NOTE — Telephone Encounter (Signed)
Patient wants to speak to Dr. Marily Memos.  She says it is very importatant but she won't tell me what it is about.

## 2012-06-13 ENCOUNTER — Other Ambulatory Visit (HOSPITAL_COMMUNITY): Payer: Self-pay | Admitting: Psychiatry

## 2012-06-13 ENCOUNTER — Ambulatory Visit (INDEPENDENT_AMBULATORY_CARE_PROVIDER_SITE_OTHER): Payer: Medicare Other | Admitting: Family Medicine

## 2012-06-13 ENCOUNTER — Encounter: Payer: Self-pay | Admitting: Family Medicine

## 2012-06-13 VITALS — BP 113/73 | HR 69 | Temp 97.9°F | Ht 66.0 in | Wt 209.0 lb

## 2012-06-13 DIAGNOSIS — N760 Acute vaginitis: Secondary | ICD-10-CM

## 2012-06-13 DIAGNOSIS — R319 Hematuria, unspecified: Secondary | ICD-10-CM

## 2012-06-13 DIAGNOSIS — N898 Other specified noninflammatory disorders of vagina: Secondary | ICD-10-CM

## 2012-06-13 DIAGNOSIS — R3129 Other microscopic hematuria: Secondary | ICD-10-CM

## 2012-06-13 LAB — POCT UA - MICROSCOPIC ONLY

## 2012-06-13 LAB — POCT URINALYSIS DIPSTICK
Leukocytes, UA: NEGATIVE
Nitrite, UA: NEGATIVE
Protein, UA: 30
pH, UA: 6

## 2012-06-13 LAB — POCT WET PREP (WET MOUNT)

## 2012-06-13 NOTE — Patient Instructions (Addendum)
Thank you for coming in today I will let you know if any of your lab work comes back abnormal Please continue taking your medications as prescribed.

## 2012-06-13 NOTE — Progress Notes (Signed)
Abigail Wallace is a 67 y.o. female who presents to [facility] today for vaginal discharge  Vaginal bleeding off an on for past year. Worse over past 2 mo. Brownish staining on diaper in the morning. Vaginal irritation and itching. Denies fevers, wt loss. Recent PAP nml.   The following portions of the patient's history were reviewed and updated as appropriate: allergies, current medications, past medical history, family and social history, and problem list.  Patient is not smoking  Past Medical History  Diagnosis Date  . Cystitis     Frequent episodes.   . Gout   . Coronary artery disease   . COPD (chronic obstructive pulmonary disease)   . GERD (gastroesophageal reflux disease)   . Mental disorder   . Psychosis     ROS as above otherwise neg.    Medications reviewed. Current Outpatient Prescriptions  Medication Sig Dispense Refill  . albuterol (PROVENTIL HFA;VENTOLIN HFA) 108 (90 BASE) MCG/ACT inhaler Inhale 2 puffs into the lungs every 6 (six) hours as needed.  1 Inhaler  3  . allopurinol (ZYLOPRIM) 100 MG tablet Take 100 mg by mouth daily.       . Bismuth Subsalicylate 99991111 MG TABS Take 1 tablet by mouth as needed (diarrhea.).      Marland Kitchen divalproex (DEPAKOTE ER) 500 MG 24 hr tablet take 1 tablet by mouth once daily  30 tablet  1  . doxycycline (VIBRAMYCIN) 100 MG capsule Take 1 capsule (100 mg total) by mouth 2 (two) times daily.  14 capsule  0  . hydrochlorothiazide (MICROZIDE) 12.5 MG capsule Take 1 capsule (12.5 mg total) by mouth daily.  30 capsule  3  . losartan (COZAAR) 50 MG tablet Take 50 mg by mouth daily.      . metoprolol tartrate (LOPRESSOR) 25 MG tablet Take 37.5 mg by mouth Twice daily.       . Misc. Devices (WALKER) MISC 1 Units by Does not apply route daily.  1 each  0  . Misc. Devices [facility]) MISC 1 Units by Does not apply route daily.  1 each  0  . Nitroglycerin (NITROSTAT SL) Place 1 tablet under the tongue every 5 (five) minutes x 3 doses as needed. For chest      .  nystatin cream (MYCOSTATIN) Apply topically 2 (two) times daily.  30 g  0  . phenazopyridine (PYRIDIUM) 95 MG tablet Take 95 mg by mouth 2 (two) times daily as needed for pain.      Marland Kitchen risperiDONE (RISPERDAL) 2 MG tablet Take 1 tablet (2 mg total) by mouth at bedtime.  30 tablet  1  . rosuvastatin (CRESTOR) 5 MG tablet Take 5 mg by mouth every evening.       . tiotropium (SPIRIVA HANDIHALER) 18 MCG inhalation capsule Place 1 capsule (18 mcg total) into inhaler and inhale daily. Two puffs in handihaler daily  30 capsule  2  . trihexyphenidyl (ARTANE) 2 MG tablet take 1 tablet by mouth at bedtime  30 tablet  1   No current facility-administered medications for this visit.    Exam: BP 113/73  Pulse 69  Temp(Src) 97.9 F (36.6 C) (Oral)  Ht 5\' 6"  (1.676 m)  Wt 209 lb (94.802 kg)  BMI 33.75 kg/m2 Gen: Well NAD HEENT: EOMI,  MMM GU: vaginal canal atrophic but moist, no blood or purulent discharge or foul smell. No masses  No results found for this or any previous visit (from the past 72 hour(s)).

## 2012-06-13 NOTE — Telephone Encounter (Signed)
Needs to be seen. She has appointment in few days.

## 2012-06-14 MED ORDER — ESTRADIOL 1 MG PO TABS: ORAL_TABLET | ORAL | Status: DC

## 2012-06-14 NOTE — Assessment & Plan Note (Signed)
Likely benign hematuria. Reviewed previous labs and imaging and no evidence of malignancy or other lesion No further workup warranted

## 2012-06-14 NOTE — Assessment & Plan Note (Addendum)
Vaginal complaints today more likely related to atrophic vaginitis Will Rx Estrace.  Pt called and informed of results and agreeable to try cream.  No evidence of blood despite pt stating there was blood this am and daily.  Lilkely some psychosomatic component Previous labs adn imaging reports reviewed at length w/ pt and no evidence of malignancy/neoplasm

## 2012-06-16 ENCOUNTER — Ambulatory Visit (INDEPENDENT_AMBULATORY_CARE_PROVIDER_SITE_OTHER): Payer: Medicare Other | Admitting: Psychiatry

## 2012-06-16 ENCOUNTER — Encounter (HOSPITAL_COMMUNITY): Payer: Self-pay | Admitting: Psychiatry

## 2012-06-16 VITALS — BP 115/75 | HR 70

## 2012-06-16 DIAGNOSIS — F200 Paranoid schizophrenia: Secondary | ICD-10-CM

## 2012-06-16 MED ORDER — TRIHEXYPHENIDYL HCL 2 MG PO TABS: ORAL_TABLET | ORAL | Status: DC

## 2012-06-16 MED ORDER — RISPERIDONE 2 MG PO TABS: 2.0000 mg | ORAL_TABLET | Freq: Every day | ORAL | Status: DC

## 2012-06-16 MED ORDER — DIVALPROEX SODIUM ER 500 MG PO TB24: ORAL_TABLET | ORAL | Status: DC
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
## 2012-06-17 ENCOUNTER — Telehealth: Payer: Self-pay | Admitting: Cardiovascular Disease

## 2012-06-17 ENCOUNTER — Telehealth: Payer: Self-pay | Admitting: Family Medicine

## 2012-06-17 NOTE — Telephone Encounter (Signed)
Message forwarded to [facility]. Tressie Stalker, LPN.  Per Dr. Rollene Fare, he will send in Rx.

## 2012-06-17 NOTE — Telephone Encounter (Signed)
Pt needs documentation faxed to [facility] verifying her visit on May 30 with Dr Marily Memos Phone number is [phone] option 2. Thanks

## 2012-06-17 NOTE — Telephone Encounter (Signed)
Mrs. Abigail Wallace is asking that Dr. Rollene Fare call her in something for a kidney infection .[facility] on [address].. Please call her at [phone]  Thanks

## 2012-06-17 NOTE — Telephone Encounter (Signed)
cipro 500 mg ERX to [facility] on [address]pt. Called and informed

## 2012-06-17 NOTE — Telephone Encounter (Signed)
Spoke with Abigail Wallace @ transportation.  She states that they have confirmation that she was seen on the 30th and nothing else is needed. Tsutomu Barfoot, [name]

## 2012-06-26 ENCOUNTER — Other Ambulatory Visit: Payer: Self-pay

## 2012-07-04 LAB — PACEMAKER DEVICE OBSERVATION
AL IMPEDENCE PM: 500 Ohm
ATRIAL PACING PM: 85
DEVICE MODEL PM: 2706033182
RV LEAD AMPLITUDE: 6.5 mv
VENTRICULAR PACING PM: 91

## 2012-07-07 ENCOUNTER — Encounter: Payer: Self-pay | Admitting: Cardiovascular Disease

## 2012-07-21 ENCOUNTER — Other Ambulatory Visit: Payer: Self-pay | Admitting: *Deleted

## 2012-07-21 DIAGNOSIS — N760 Acute vaginitis: Secondary | ICD-10-CM

## 2012-07-22 MED ORDER — ESTRADIOL 1 MG PO TABS: ORAL_TABLET | ORAL | Status: DC

## 2012-07-28 ENCOUNTER — Other Ambulatory Visit: Payer: Self-pay | Admitting: *Deleted

## 2012-07-28 DIAGNOSIS — IMO0001 Reserved for inherently not codable concepts without codable children: Secondary | ICD-10-CM

## 2012-07-29 ENCOUNTER — Other Ambulatory Visit: Payer: Self-pay | Admitting: *Deleted

## 2012-07-29 MED ORDER — HYDROCHLOROTHIAZIDE 12.5 MG PO CAPS: 12.5000 mg | ORAL_CAPSULE | Freq: Every day | ORAL | Status: DC

## 2012-07-29 MED ORDER — LOSARTAN POTASSIUM 50 MG PO TABS: 50.0000 mg | ORAL_TABLET | Freq: Every day | ORAL | Status: DC

## 2012-08-14 ENCOUNTER — Ambulatory Visit (HOSPITAL_COMMUNITY): Payer: Self-pay | Admitting: Psychiatry

## 2012-08-19 ENCOUNTER — Ambulatory Visit (HOSPITAL_COMMUNITY): Payer: Self-pay | Admitting: Psychiatry

## 2012-08-24 ENCOUNTER — Other Ambulatory Visit (HOSPITAL_COMMUNITY): Payer: Self-pay | Admitting: Psychiatry

## 2012-08-31 ENCOUNTER — Other Ambulatory Visit (HOSPITAL_COMMUNITY): Payer: Self-pay | Admitting: Psychiatry

## 2012-08-31 DIAGNOSIS — F200 Paranoid schizophrenia: Secondary | ICD-10-CM

## 2012-09-08 ENCOUNTER — Ambulatory Visit (HOSPITAL_COMMUNITY): Payer: Self-pay | Admitting: Psychiatry

## 2012-09-14 ENCOUNTER — Other Ambulatory Visit (HOSPITAL_COMMUNITY): Payer: Self-pay | Admitting: Psychiatry

## 2012-09-18 ENCOUNTER — Other Ambulatory Visit (HOSPITAL_COMMUNITY): Payer: Self-pay | Admitting: Psychiatry

## 2012-09-24 ENCOUNTER — Encounter (HOSPITAL_COMMUNITY): Payer: Self-pay | Admitting: Psychiatry

## 2012-09-24 ENCOUNTER — Ambulatory Visit (INDEPENDENT_AMBULATORY_CARE_PROVIDER_SITE_OTHER): Payer: Medicare Other | Admitting: Psychiatry

## 2012-09-24 VITALS — BP 106/61 | HR 70 | Ht 66.0 in | Wt 208.0 lb

## 2012-09-24 DIAGNOSIS — F200 Paranoid schizophrenia: Secondary | ICD-10-CM

## 2012-09-24 MED ORDER — QUETIAPINE FUMARATE 100 MG PO TABS: ORAL_TABLET | ORAL | Status: DC
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
## 2012-09-29 ENCOUNTER — Telehealth (HOSPITAL_COMMUNITY): Payer: Self-pay | Admitting: *Deleted

## 2012-09-29 NOTE — Telephone Encounter (Signed)
Advised son that no ROI on file for him, although he is mother's care giver.Son states he thought she had filled one out, but will ask her to do again on next appt. At this time, verbal consent obtained from pt to discuss medications with son. New medication regime given to son.

## 2012-10-01 ENCOUNTER — Ambulatory Visit (INDEPENDENT_AMBULATORY_CARE_PROVIDER_SITE_OTHER): Payer: Medicare Other | Admitting: Family Medicine

## 2012-10-01 ENCOUNTER — Encounter: Payer: Self-pay | Admitting: Family Medicine

## 2012-10-01 VITALS — BP 149/78 | HR 69 | Temp 98.1°F | Wt 208.0 lb

## 2012-10-01 DIAGNOSIS — R300 Dysuria: Secondary | ICD-10-CM | POA: Insufficient documentation

## 2012-10-01 DIAGNOSIS — N898 Other specified noninflammatory disorders of vagina: Secondary | ICD-10-CM

## 2012-10-01 DIAGNOSIS — N939 Abnormal uterine and vaginal bleeding, unspecified: Secondary | ICD-10-CM

## 2012-10-01 DIAGNOSIS — N920 Excessive and frequent menstruation with regular cycle: Secondary | ICD-10-CM

## 2012-10-01 LAB — POCT URINALYSIS DIPSTICK
Bilirubin, UA: NEGATIVE
Glucose, UA: NEGATIVE
Leukocytes, UA: NEGATIVE
Nitrite, UA: NEGATIVE
Urobilinogen, UA: 0.2

## 2012-10-01 LAB — POCT UA - MICROSCOPIC ONLY

## 2012-10-01 LAB — POCT HEMOGLOBIN: Hemoglobin: 14 g/dL (ref 12.2–16.2)

## 2012-10-01 MED ORDER — PHENAZOPYRIDINE HCL 95 MG PO TABS: 95.0000 mg | ORAL_TABLET | Freq: Two times a day (BID) | ORAL | Status: DC | PRN

## 2012-10-01 NOTE — Assessment & Plan Note (Signed)
Reported per patient with no current symptoms. Hemodynamically stable. Plan Hemoglobin to determine degree of blood loss Followup with PCP to determine etiology.

## 2012-10-01 NOTE — Addendum Note (Signed)
Addended by: [place], Travon Crochet on: 10/01/2012 12:32 PM   Modules accepted: Orders

## 2012-10-01 NOTE — Progress Notes (Signed)
Family Medicine Office Visit Note   Subjective:   Patient ID: Abigail Wallace, female  DOB: 1945/12/01, 67 y.o.. MRN: [id number]   Pt that comes today for same the appointment complaining of history vaginal bleeding and dysuria (onset 3 weeks).  Vaginal bleeding: Reports is probably a right now but have bled intensively for for the past month. She denies shortness of breath, chest pain, fatigue, focalized weakness, headaches or lightheadedness. Patient denies vaginal discharge.  Dysuria: Reports burning with urination and frequency for about 3 weeks. Denies fever, chills, nausea, vomiting, or flank pain. Patient is requesting antibiotic treatment for her symptoms.  Review of Systems:  History of present illness  Objective:   Physical Exam: Gen:  NAD wheelchair bound. HEENT: Moist mucous membranes  CV: Regular rate and rhythm, no murmurs rubs or gallops PULM: Clear to auscultation bilaterally. No wheezes/rales/rhonchi ABD: Soft, non tender, non distended, normal bowel sounds. No CVA tenderness EXT: No edema Neuro: Alert and oriented x3. No focalization Patient declined pelvic exam.   Assessment & Plan:

## 2012-10-01 NOTE — Patient Instructions (Addendum)
We have obtained tests today to evaluate the menstrual bleeding and your urinary symptoms. We will contact you if you need further treatment. Please followup with your primary care doctor if your condition does not resolve or worsens.

## 2012-10-01 NOTE — Assessment & Plan Note (Signed)
Was present for about 3 weeks. Denies fever or flank pain. Physical exam noncontributory Plan UA and pending on results urine culture and tx antibiotic.

## 2012-10-06 ENCOUNTER — Telehealth (HOSPITAL_COMMUNITY): Payer: Self-pay | Admitting: *Deleted

## 2012-10-06 ENCOUNTER — Telehealth (HOSPITAL_COMMUNITY): Payer: Self-pay | Admitting: Psychiatry

## 2012-10-06 MED ORDER — RISPERIDONE 2 MG PO TABS: 2.0000 mg | ORAL_TABLET | Freq: Every day | ORAL | Status: DC

## 2012-10-06 MED ORDER — TRAZODONE HCL 50 MG PO TABS: 50.0000 mg | ORAL_TABLET | Freq: Every day | ORAL | Status: DC

## 2012-10-06 NOTE — Telephone Encounter (Signed)
I returned the phone call and spoke to her son.  Her son believes are "is not working.  Patient has not slept for past 4 days and appears manic.  Patient was recently seen and requested to change her Risperdal to Seroquel.  Some believe patient should go back to a restaurant because it was working very well for her.  I agree.  I would discontinue Seroquel and we will restart Risperdal 2 mg at bedtime.  Son also requested a medicine for insomnia.  I believe first of all alone will help her sleep however if patient unable to sleep than trazodone 25 mg may be tried.  We will call trazodone to the pharmacy.  I recommend to call us back if she does not feel any improvement in 2 days off anytime having severe depression suicidal thinking and homicidal thinking the need to call 911 or go to a local emergency room.

## 2012-10-06 NOTE — Telephone Encounter (Signed)
Sister called to discuss pt and pt medication. Informed sister cannot discuss due to no ROI on file.Sister expressed concern and frustration with system,stating she is very worried.  Advised sister that pt can get earlier appt (her next is 10/9) or if family is very concerned, can go to ED.

## 2012-10-06 NOTE — Telephone Encounter (Signed)
Per son, mother is very manic, hasn't slept much in past 3-4 days, paranoid, anxious-says she can't breathe.Mother's sister was able to secure appt with Dr. Adele Schilder for Thursday 9/25, earlier than previously scheduled. Requests call from provider

## 2012-10-09 ENCOUNTER — Ambulatory Visit (HOSPITAL_COMMUNITY): Payer: Self-pay | Admitting: Psychiatry

## 2012-10-13 ENCOUNTER — Telehealth: Payer: Self-pay | Admitting: Family Medicine

## 2012-10-13 NOTE — Telephone Encounter (Signed)
Son called. Says his mother is having trouble getting out of bed. Wants to know if Medicare and Medicaid would pay for a hospital bed Please advise

## 2012-10-13 NOTE — Telephone Encounter (Signed)
Will forward to MD. Jazmin Hartsell,CMA  

## 2012-10-14 NOTE — Telephone Encounter (Signed)
Son is aware and will contact insurance before moving forward. Chade Pitner,CMA

## 2012-10-14 NOTE — Telephone Encounter (Signed)
They may but will require evaluation for this and filling out paperwork. Pt needs to contact Medicaid/medicare to find out exactly what needs to be filled out then come in for evaluation.

## 2012-10-20 ENCOUNTER — Other Ambulatory Visit (HOSPITAL_COMMUNITY): Payer: Self-pay | Admitting: Psychiatry

## 2012-10-22 ENCOUNTER — Other Ambulatory Visit (HOSPITAL_COMMUNITY): Payer: Self-pay | Admitting: Psychiatry

## 2012-10-23 ENCOUNTER — Ambulatory Visit (HOSPITAL_COMMUNITY): Payer: Self-pay | Admitting: Psychiatry

## 2012-11-06 ENCOUNTER — Encounter (HOSPITAL_COMMUNITY): Payer: Self-pay | Admitting: *Deleted

## 2012-11-06 ENCOUNTER — Other Ambulatory Visit (HOSPITAL_COMMUNITY): Payer: Self-pay | Admitting: Psychiatry

## 2012-11-06 DIAGNOSIS — F200 Paranoid schizophrenia: Secondary | ICD-10-CM

## 2012-11-06 NOTE — Telephone Encounter (Signed)
Spoke with patient and son, Selena Lesser. Patient now taking Seroquel and Risperdal. They both state pt is doing well and sleeping well. Informed both that MD had d/c'd Seroquel and started pt on Risperdal and Trazodone 10/06/12 - noted in call from Dr. Adele Schilder. Son stated pt tried Trazodone after MD ordered it 10/06/12, but it did not help her sleep, so they stopped it. They did start the Risperdal as ordered same day Son and patient restarted Seroquel.

## 2012-11-06 NOTE — Progress Notes (Signed)
Received refill request for Seroquel and Risperdal Spoke with patient and son, Abigail Wallace. Patient now taking Seroquel and Risperdal. They both state pt is doing well and sleeping well. Informed both that MD had d/c'd Seroquel and started pt on Risperdal and Trazodone 10/06/12 - noted in call from Dr. Adele Schilder. Son stated pt tried Trazodone after MD ordered it 10/06/12, but it did not help her sleep, so they stopped it. They did start the Risperdal as ordered same day Son and patient restarted Seroquel.   Contacted son and patient: Per Dr.Arfeen, stop Seroquel cannot take two antipsychotics together. May refill Risperdal. Since Trazodone did not work, can add Remeron 15 mg at bedtime.  Per patient and son, Remeron gives her tremors and she will not take it, so don't call it in. They will stop Seroquel and just continue Risperdal. Encouraged to call office for questions and concerns.

## 2012-11-06 NOTE — Telephone Encounter (Signed)
Per Dr.Arfeen, stop Seroquel cannot take two antipsychotics together. May refill Risperdal. Since Trazodone did not work, can add Remeron 15 mg at bedtime.  Per patient and son, Remeron gives her tremors and she will not take it, so don't call it in. They will stop Seroquel and just continue Risperdal. Encouraged to call office for questions and concerns.

## 2012-11-13 ENCOUNTER — Encounter (HOSPITAL_COMMUNITY): Payer: Self-pay | Admitting: Psychiatry

## 2012-11-13 ENCOUNTER — Ambulatory Visit (INDEPENDENT_AMBULATORY_CARE_PROVIDER_SITE_OTHER): Payer: Medicare Other | Admitting: Psychiatry

## 2012-11-13 VITALS — BP 128/74 | HR 74 | Ht 66.0 in

## 2012-11-13 DIAGNOSIS — F200 Paranoid schizophrenia: Secondary | ICD-10-CM

## 2012-11-13 MED ORDER — OLANZAPINE 15 MG PO TABS: 15.0000 mg | ORAL_TABLET | Freq: Every day | ORAL | Status: DC

## 2012-11-13 MED ORDER — DIVALPROEX SODIUM ER 500 MG PO TB24: ORAL_TABLET | ORAL | Status: DC

## 2012-11-13 MED ORDER — TRIHEXYPHENIDYL HCL 2 MG PO TABS: ORAL_TABLET | ORAL | Status: DC
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
## 2012-12-02 ENCOUNTER — Ambulatory Visit: Payer: Self-pay | Admitting: Family Medicine

## 2012-12-15 ENCOUNTER — Other Ambulatory Visit: Payer: Self-pay | Admitting: Family Medicine

## 2012-12-15 DIAGNOSIS — IMO0001 Reserved for inherently not codable concepts without codable children: Secondary | ICD-10-CM

## 2012-12-15 MED ORDER — HYDROCHLOROTHIAZIDE 12.5 MG PO CAPS: 12.5000 mg | ORAL_CAPSULE | Freq: Every day | ORAL | Status: DC

## 2012-12-17 ENCOUNTER — Ambulatory Visit: Payer: Self-pay | Admitting: Family Medicine

## 2012-12-29 ENCOUNTER — Ambulatory Visit (INDEPENDENT_AMBULATORY_CARE_PROVIDER_SITE_OTHER): Payer: Medicare Other | Admitting: Cardiovascular Disease

## 2012-12-29 ENCOUNTER — Encounter: Payer: Self-pay | Admitting: Cardiovascular Disease

## 2012-12-29 VITALS — BP 128/64 | HR 71 | Ht 66.0 in | Wt 216.8 lb

## 2012-12-29 DIAGNOSIS — IMO0001 Reserved for inherently not codable concepts without codable children: Secondary | ICD-10-CM

## 2012-12-29 DIAGNOSIS — Z950 Presence of cardiac pacemaker: Secondary | ICD-10-CM

## 2012-12-29 DIAGNOSIS — I2510 Atherosclerotic heart disease of native coronary artery without angina pectoris: Secondary | ICD-10-CM

## 2012-12-29 DIAGNOSIS — R030 Elevated blood-pressure reading, without diagnosis of hypertension: Secondary | ICD-10-CM

## 2012-12-29 DIAGNOSIS — E7800 Pure hypercholesterolemia, unspecified: Secondary | ICD-10-CM

## 2012-12-29 LAB — PACEMAKER DEVICE OBSERVATION

## 2012-12-29 NOTE — Progress Notes (Signed)
Patient ID: Abigail Wallace, female   DOB: August 31, 1945, 67 y.o.   MRN: [id number]     PATIENT PROFILE:  [name] is a 67 year old female who presents to the office today to establish cardiology care with me. She is a former patient of Dr. Terance Ice last saw her 6 months ago in his solo practice.  HPI: [name] has a history of psychosis and schizophrenia for which she is on Artane in addition to Zyprexa and Depakote. In 2007, she was found to have mild coronary obstructive disease by cardiac catheterization by Dr. Tami Ribas. She has a history of complete heart block and has a Medtronic Vitatron pacemaker which was implanted in September 2007. Apparently previously this was interrogated 6 months ago at which time longevity was estimated to be approximately 1 to less than 2 years. An echo Doppler study in 2011 showed severe concentric left ventricular hypertrophy with an ejection fraction of 65-70%. She was felt of normal right ventricular systolic pressure. There is also a history of tobacco abuse and had been a chronic smoker, stopped 5 years, and then resumed it currently is smoking vapor cigarettes. According to her son who is with her today, she has had significant increase in her weight particularly due to inactivity. She also has a history of hyperlipidemia for which she's been on Crestor. She complains today of noticing that when she moves her arm she thinks her pacemaker moves. She presents for establishment of cardiology care.  Past Medical History  Diagnosis Date  . Cystitis     Frequent episodes.   . Gout   . Coronary artery disease   . COPD (chronic obstructive pulmonary disease)   . GERD (gastroesophageal reflux disease)   . Mental disorder   . Psychosis     Past Surgical History  Procedure Laterality Date  . Pacemaker placement  09/28/2005    2/2 SSS?  Marland Kitchen Cholecystectomy    . Tubal ligation    . Cardiac catheterization  2007    Non-critical.   . Persantine  stress test  05/01/2010    EF 66%. Normal LV sys fx. Unchanged from previous studies.   . Insert / replace / remove pacemaker  2007    [place]    Allergies  Allergen Reactions  . Aripiprazole Anaphylaxis    Tremors  . Benadryl [Diphenhydramine Hcl] Other (See Comments)    States affects her vision  . Remeron [Mirtazapine] Other (See Comments)    Tremors and shakes  . Codeine Other (See Comments)    Unknown   . Cogentin [Benztropine] Other (See Comments)    Affects mobility  . Haloperidol Lactate     Vision changes  . Latuda [Lurasidone Hcl]     "Tremors and shakes."   . Latex Rash    Current Outpatient Prescriptions  Medication Sig Dispense Refill  . allopurinol (ZYLOPRIM) 100 MG tablet Take 100 mg by mouth daily.       . divalproex (DEPAKOTE ER) 500 MG 24 hr tablet take 1 tablet by mouth once daily (MUST KEEP APPT)  30 tablet  1  . hydrochlorothiazide (MICROZIDE) 12.5 MG capsule Take 1 capsule (12.5 mg total) by mouth daily.  30 capsule  6  . losartan (COZAAR) 50 MG tablet Take 1 tablet (50 mg total) by mouth daily.  90 tablet  3  . metoprolol tartrate (LOPRESSOR) 25 MG tablet Take 37.5 mg by mouth Twice daily.       . Nitroglycerin (NITROSTAT  SL) Place 1 tablet under the tongue every 5 (five) minutes x 3 doses as needed. For chest      . OLANZapine (ZYPREXA) 15 MG tablet Take 15 mg by mouth. Takes 1/2 tablet daily      . rosuvastatin (CRESTOR) 5 MG tablet Take 5 mg by mouth every evening.       . trihexyphenidyl (ARTANE) 2 MG tablet take 1 tablet by mouth at bedtime  30 tablet  1   No current facility-administered medications for this visit.    Socially she is divorced has 3 children 11 grandchildren 5 great-grandchildren. She now is using vapor cigarettes. She does not exercise. She sees Dr. Marchia Bond at [facility] for her psychiatric issues.  Family History  Problem Relation Age of Onset  . Diabetes type II Son   . Heart disease Mother   . Kidney disease  Mother   . Heart disease Father   . Kidney disease Father     ROS is negative for fever chills or night sweats. Since 2012 her weight is increased from 167 to 216 pounds She denies skin rash. She denies change in vision. She doesn't awakening. She denies hearing issues. She denies wheezing. There is mild shortness of breath with activity. She denies chest pressure. She denies palpitations. She denies GERD symptoms. She does note some mild swelling intermittently. She denies claudication. She denies paresthesias. She does have multiple allergies. There is no diabetes. She denies cold or heat intolerance.  She does have psychiatric issues and schizophrenia with past visual hallucinations for  which she is on medical therapy. Other comprehensive 14 point system review is negative.  PE BP 128/64  Pulse 71  Ht 5\' 6"  (1.676 m)  Wt 216 lb 12.8 oz (98.34 kg)  BMI 35.01 kg/m2 General: Alert, oriented, no distress.  Skin: normal turgor, no rashes HEENT: Normocephalic, atraumatic. Pupils round and reactive; sclera anicteric; Fundi no hemorrhages or exudate Nose without nasal septal hypertrophy Mouth/Parynx benign; Mallinpatti scale Neck: No JVD, no carotid bruits Lungs: clear to ausculatation and percussion; no wheezing or rales Chest wall: without tenderness to palpitation Heart: RRR, s1 s2 normal 1/6 systolic murmur. No S3 gallop Abdomen: soft, nontender; no hepatosplenomehaly, BS+; abdominal aorta nontender and not dilated by palpation. Back: no CVA tenderness Pulses 2+ Extremities: no clubbinbg cyanosis or edema, Homan's sign negative  Neurologic: grossly nonfocal; Cranial nerves grossly wnl Psychologic: Normal mood and affect    ECG: AV paced rhythm at 71 beats per minute.  Her pacemaker was interrogated today. She has now battery longevity of 6 months to less than one year she has underlying complete heart block with no ventricular escape. She has not had any episodes f tachydysrhythmias.  Head normal atrial and ventricular thresholds. She is in the DDDR mode with a lower rate at 70. She's a paced 87% the patient 5%  LABS:  BMET    Component Value Date/Time   NA 139 03/20/2012 1510   K 4.0 03/20/2012 1510   CL 105 03/20/2012 1510   CO2 22 03/20/2012 1510   GLUCOSE 88 03/20/2012 1510   BUN 16 03/20/2012 1510   CREATININE 0.78 03/20/2012 1510   CREATININE 0.72 11/23/2010 1215   CALCIUM 9.3 03/20/2012 1510   GFRNONAA 87* 11/23/2010 1215   GFRAA >90 11/23/2010 1215     Hepatic Function Panel     Component Value Date/Time   PROT 6.8 03/20/2012 1510   ALBUMIN 4.1 03/20/2012 1510   AST 10 03/20/2012 1510  ALT <8 03/20/2012 1510   ALKPHOS 74 03/20/2012 1510   BILITOT 0.3 03/20/2012 1510   BILIDIR <0.1 05/27/2008 1005   IBILI NOT CALCULATED 05/27/2008 1005     CBC    Component Value Date/Time   WBC 10.2 12/28/2011 1211   RBC 4.75 12/28/2011 1211   HGB 14.0 10/01/2012 1145   HGB 13.7 12/28/2011 1211   HCT 42.2 12/28/2011 1211   PLT 282 12/28/2011 1211   MCV 88.8 12/28/2011 1211   MCH 28.8 12/28/2011 1211   MCHC 32.5 12/28/2011 1211   RDW 14.7 12/28/2011 1211   LYMPHSABS 3.6 12/28/2011 1211   MONOABS 0.7 12/28/2011 1211   EOSABS 0.2 12/28/2011 1211   BASOSABS 0.1 12/28/2011 1211     BNP    Component Value Date/Time   PROBNP <30.0 05/27/2008 0645    Lipid Panel     Component Value Date/Time   CHOL 122 11/19/2010 0605   TRIG 73 11/19/2010 0605   HDL 45 11/19/2010 0605   CHOLHDL 2.7 11/19/2010 0605   VLDL 15 11/19/2010 0605   LDLCALC 62 11/19/2010 0605     RADIOLOGY: No results found.   ASSESSMENT AND PLAN: Abigail Wallace blood pressure today is well controlled on her medical therapy consisting of losartan 50 mg and HCTZ. She is on rosuvastatin 5 mg for hyperlipidemia. She has had significant weight gain over the past 2 years of approximately 40 pounds. I discussed with her the importance of complete smoking cessation. We discussed proper diet. Her son is cooking her meals. Her  pacemaker is functioning normally but now longevity appears to be 6 months to less than or equal to one year. Because of this, I'm recommending that she see Dr. Sallyanne Kuster in 6 months for evaluation of her pacemaker and potential need for generator upgrade in the near future. Her current unit is not allow for telephonic monitoring.   Troy Sine, MD, [facility] 12/29/2012 7:31 PM

## 2012-12-29 NOTE — Patient Instructions (Signed)
Your physician recommends that you schedule a follow-up appointment in: 6 months with Dr Sallyanne Kuster for pacer check

## 2012-12-30 ENCOUNTER — Ambulatory Visit: Payer: Self-pay | Admitting: Family Medicine

## 2013-01-09 ENCOUNTER — Other Ambulatory Visit (HOSPITAL_COMMUNITY): Payer: Self-pay | Admitting: Psychiatry

## 2013-01-09 DIAGNOSIS — F200 Paranoid schizophrenia: Secondary | ICD-10-CM

## 2013-01-12 ENCOUNTER — Other Ambulatory Visit (HOSPITAL_COMMUNITY): Payer: Self-pay | Admitting: Psychiatry

## 2013-01-12 DIAGNOSIS — F200 Paranoid schizophrenia: Secondary | ICD-10-CM

## 2013-01-13 ENCOUNTER — Ambulatory Visit (HOSPITAL_COMMUNITY): Payer: Self-pay | Admitting: Psychiatry

## 2013-02-10 ENCOUNTER — Other Ambulatory Visit: Payer: Self-pay | Admitting: *Deleted

## 2013-02-10 MED ORDER — METOPROLOL TARTRATE 25 MG PO TABS: 37.5000 mg | ORAL_TABLET | Freq: Two times a day (BID) | ORAL | Status: DC

## 2013-02-10 NOTE — Telephone Encounter (Signed)
Rx was sent to pharmacy electronically. 

## 2013-02-12 ENCOUNTER — Other Ambulatory Visit (HOSPITAL_COMMUNITY): Payer: Self-pay | Admitting: Psychiatry

## 2013-02-12 NOTE — Telephone Encounter (Signed)
Must keep appointment with Physician.

## 2013-02-17 ENCOUNTER — Telehealth: Payer: Self-pay | Admitting: Family Medicine

## 2013-02-17 NOTE — Telephone Encounter (Signed)
Pt called and would like Dr. Marily Memos to call her in some ear infection medication. jw

## 2013-02-17 NOTE — Telephone Encounter (Signed)
Will forward to MD but pt might have to come in for evaluation before he can send this in. [facility]

## 2013-02-20 MED ORDER — ANTIPYRINE-BENZOCAINE 5.4-1.4 % OT SOLN: 3.0000 [drp] | OTIC | Status: DC | PRN

## 2013-02-20 NOTE — Telephone Encounter (Signed)
R ear pain.  Present for 2 weeks Cough syrup recently w/ some benefit Some improvement overall Recent URI. Denies fevers  Recommending auralgan, NSAIDs and rest. Come in for eval if not better over the weekend.  Linna Darner, MD Family Medicine PGY-3 02/20/2013, 8:57 AM

## 2013-02-25 ENCOUNTER — Encounter (HOSPITAL_COMMUNITY): Payer: Self-pay | Admitting: Psychiatry

## 2013-02-25 ENCOUNTER — Ambulatory Visit (INDEPENDENT_AMBULATORY_CARE_PROVIDER_SITE_OTHER): Payer: Medicare Other | Admitting: Psychiatry

## 2013-02-25 VITALS — BP 136/73 | HR 72 | Wt 210.0 lb

## 2013-02-25 DIAGNOSIS — F200 Paranoid schizophrenia: Secondary | ICD-10-CM

## 2013-02-25 DIAGNOSIS — Z79899 Other long term (current) drug therapy: Secondary | ICD-10-CM

## 2013-02-25 MED ORDER — DIVALPROEX SODIUM ER 500 MG PO TB24: ORAL_TABLET | ORAL | Status: DC

## 2013-02-25 MED ORDER — OLANZAPINE 15 MG PO TABS: ORAL_TABLET | ORAL | Status: DC

## 2013-02-25 NOTE — Progress Notes (Signed)
[facility] [phone] Progress Note  Abigail Wallace [id number] 68 y.o.  02/25/2013 2:12 PM  Chief Complaint:  Medication management and followup.        History of Present Illness:  Abigail Wallace came for her followup appointment with her son.  She's compliant with olanzapine however admitted that she has cut down her dosage to a half tablet.  She endorse that her tremors and shakes are much improved.  She continues to take Artane .  Today she came that her other son who endorsed that patient is sleeping better.  Patient continues to have paranoia and some time she believes people are in the house but she denies any hallucination.  She admitted some time irritable and angry but denies any suicidal thoughts or homicidal thoughts.  She is very reluctant to take any other medication and we have tried Seroquel, Risperdal in the past .  She always did very well on olanzapine.  She has lost weight from the past.  Her tremors and shakes are less intense and less frequent from the past.  Patient is not drinking or using any illegal substances.  She's compliant with Depakote and Artane.  We will do blood work including Depakote level and hemoglobin A1c.  Her last hemoglobin A1c is 5.1 which was done in March 2014.    Suicidal Ideation: No Plan Formed: No Patient has means to carry out plan: No  Homicidal Ideation: No Plan Formed: No Patient has means to carry out plan: No  Review of Systems  Eyes: Negative.   Neurological: Positive for tremors. Negative for focal weakness, seizures and loss of consciousness.  Psychiatric/Behavioral: The patient is nervous/anxious and has insomnia.     Psychiatric: Agitation: No Hallucination: No Depressed Mood: No Insomnia: Yes Hypersomnia: No Altered Concentration: No Feels Worthless: No Grandiose Ideas: No Belief In Special Powers: No New/Increased Substance Abuse: No Compulsions: No  Neurologic: Headache: Yes Seizure: No Paresthesias:  No  Medical history Patient sees Dr. Hampton Abbot , She also see cardiologist Dr. Rollene Fare .  She has a history of GERD COPD hypertension gout and coronary artery disease.  She has a pacemaker.  Family and Social History: Patient was born in raised in [place].  Patient denies any history of physical sexual or verbal abuse.  She has 3 children and 6 grandchildren.  Patient lives by herself in her apartment however she has a good support from her son.  Her son usually comes with her on appointments.   Outpatient Encounter Prescriptions as of 02/25/2013  Medication Sig  . allopurinol (ZYLOPRIM) 100 MG tablet Take 100 mg by mouth daily.   Marland Kitchen antipyrine-benzocaine (AURALGAN) otic solution Place 3-4 drops into both ears every 2 (two) hours as needed for ear pain.  . divalproex (DEPAKOTE ER) 500 MG 24 hr tablet take 1 tablet by mouth daily  . hydrochlorothiazide (MICROZIDE) 12.5 MG capsule Take 1 capsule (12.5 mg total) by mouth daily.  Marland Kitchen losartan (COZAAR) 50 MG tablet Take 1 tablet (50 mg total) by mouth daily.  . metoprolol tartrate (LOPRESSOR) 25 MG tablet Take 1.5 tablets (37.5 mg total) by mouth 2 (two) times daily.  . Nitroglycerin (NITROSTAT SL) Place 1 tablet under the tongue every 5 (five) minutes x 3 doses as needed. For chest  . OLANZapine (ZYPREXA) 15 MG tablet take 1/2 tablet by mouth at bedtime  . rosuvastatin (CRESTOR) 5 MG tablet Take 5 mg by mouth every evening.   . trihexyphenidyl (ARTANE) 2 MG tablet take 1 tablet  by mouth at bedtime  . [DISCONTINUED] divalproex (DEPAKOTE ER) 500 MG 24 hr tablet take 1 tablet by mouth daily  . [DISCONTINUED] OLANZapine (ZYPREXA) 15 MG tablet take 1 tablet by mouth at bedtime    Past Psychiatric History/Hospitalization(s): Patient was admitted at [facility] 20 years ago due to severe psychosis.  She has one suicidal attempt when she tried to cut her wrist .  In the past he had tried Haldol, Seroquel, Risperdal,  Abilify and clozapine.  Patient has a  bad reaction with Abilify.  Patient has history of anger and severe mood swing.  She has history of mania and depression.   Anxiety: Yes Bipolar Disorder: No Depression: No Mania: Yes Psychosis: Yes Schizophrenia: Yes Personality Disorder: No Hospitalization for psychiatric illness: Yes History of Electroconvulsive Shock Therapy: No Prior Suicide Attempts: Yes  Physical Exam: Constitutional:  BP 136/73  Pulse 72  Wt 210 lb (95.255 kg)   General Appearance: alert, oriented, no acute distress, she is fairly groomed and uses wheelchair as she has a balance issue.  She has tremors and shakes .    Musculoskeletal: Strength & Muscle Tone: decreased Gait & Station: unsteady Patient leans: N/A  Psychiatric: Speech (describe rate, volume, coherence, spontaneity, and abnormalities if any):  Speech is fast and rapid and pressured.  She has increased volume and tone.  Thought Process (describe rate, content, abstract reasoning, and computation): Illogical with flight of idea or loose association.  Associations: Circumstantial, fund of knowledge average  Thoughts: Paranoid ideation and somatic delusions.   Mental Status: Orientation: oriented to person and place Mood & Affect: elevated affect Attention Span & Concentration: Poor  Medical Decision Making (Choose Three): Established Problem, Stable/Improving (1), Review of Psycho-Social Stressors (1), Review or order clinical lab tests (1), Review of Last Therapy Session (1), Review of Medication Regimen & Side Effects (2) and Review of New Medication or Change in Dosage (2)  Assessment: Axis I: Schizophrenia paranoid type  Axis II: Deferred  Axis III: See medical history  Axis IV: Mild to moderate, chronic illness  Axis V: 50-55   Plan:  I have a long discussion with the patient and her son.  I did reduce her olanzapine to 15 mg half tablet at bedtime.  Patient does not want to take full tablet.  She has some paranoia however  she is sleeping good.  Recommend to continue Depakote and Artane at present dose.  I will do blood work including CBC, CMP, hemoglobin A1c and Depakote level.  Recommend to call us back if she has any question or any concern.  Followup in 3 months. Time spent 25 minutes.  More than 50% of the time spent in psychoeducation, counseling and coordination of care.  Discuss safety plan that anytime having active suicidal thoughts or homicidal thoughts then patient need to call 911 or go to the local emergency room.  Seriyah Collison T., MD 02/25/2013

## 2013-03-16 ENCOUNTER — Other Ambulatory Visit (HOSPITAL_COMMUNITY): Payer: Self-pay | Admitting: Psychiatry

## 2013-03-17 ENCOUNTER — Other Ambulatory Visit (HOSPITAL_COMMUNITY): Payer: Self-pay | Admitting: Psychiatry

## 2013-03-24 ENCOUNTER — Encounter: Payer: Self-pay | Admitting: Family Medicine

## 2013-03-24 ENCOUNTER — Ambulatory Visit (INDEPENDENT_AMBULATORY_CARE_PROVIDER_SITE_OTHER): Payer: Medicare Other | Admitting: Family Medicine

## 2013-03-24 VITALS — BP 160/90 | HR 99 | Temp 97.7°F | Wt 208.0 lb

## 2013-03-24 DIAGNOSIS — R030 Elevated blood-pressure reading, without diagnosis of hypertension: Secondary | ICD-10-CM

## 2013-03-24 DIAGNOSIS — R3989 Other symptoms and signs involving the genitourinary system: Secondary | ICD-10-CM

## 2013-03-24 DIAGNOSIS — R300 Dysuria: Secondary | ICD-10-CM

## 2013-03-24 DIAGNOSIS — H60399 Other infective otitis externa, unspecified ear: Secondary | ICD-10-CM

## 2013-03-24 DIAGNOSIS — H6090 Unspecified otitis externa, unspecified ear: Secondary | ICD-10-CM

## 2013-03-24 DIAGNOSIS — IMO0001 Reserved for inherently not codable concepts without codable children: Secondary | ICD-10-CM

## 2013-03-24 DIAGNOSIS — R399 Unspecified symptoms and signs involving the genitourinary system: Secondary | ICD-10-CM

## 2013-03-24 LAB — POCT URINALYSIS DIPSTICK
Bilirubin, UA: NEGATIVE
Glucose, UA: NEGATIVE
Ketones, UA: NEGATIVE
NITRITE UA: POSITIVE
PH UA: 5.5
Protein, UA: 100
Spec Grav, UA: 1.02
UROBILINOGEN UA: 0.2

## 2013-03-24 MED ORDER — FLUCONAZOLE 150 MG PO TABS: 150.0000 mg | ORAL_TABLET | Freq: Once | ORAL | Status: DC

## 2013-03-24 MED ORDER — ANTIPYRINE-BENZOCAINE 5.4-1.4 % OT SOLN: 3.0000 [drp] | OTIC | Status: DC | PRN

## 2013-03-24 MED ORDER — CEPHALEXIN 500 MG PO CAPS: 500.0000 mg | ORAL_CAPSULE | Freq: Three times a day (TID) | ORAL | Status: DC

## 2013-03-24 NOTE — Addendum Note (Signed)
Addended by: Christen Bame D on: 03/24/2013 02:47 PM   Modules accepted: Orders

## 2013-03-24 NOTE — Assessment & Plan Note (Signed)
UTI noted on UA today and symptomatic Keflex x 10 days due to CVA tenderness

## 2013-03-24 NOTE — Assessment & Plan Note (Signed)
No change in regimen Question compliance and pt in pain today

## 2013-03-24 NOTE — Assessment & Plan Note (Signed)
Pt to start auralgan NSAIDs

## 2013-03-24 NOTE — Progress Notes (Signed)
Abigail Wallace is a 68 y.o. female who presents to [facility] today for SD appt for ear pain  Ear pain: Started 4 wks ago. Has not taken auralgan. Ibuprofen w/o benefit. R sided. Denies any popping. Denies any fevers.   Dysuria: present for 3 wks. Associated w/ back pain. Urinating 10 during the day and 6 at night. Associated w/ lower abdominal pain. Denies fevers.    HTN: taking all medications as prescrbed.   The following portions of the patient's history were reviewed and updated as appropriate: allergies, current medications, past medical history, family and social history, and problem list.  Patient is a nonsmoker.  Past Medical History  Diagnosis Date  . Cystitis     Frequent episodes.   . Gout   . Coronary artery disease   . COPD (chronic obstructive pulmonary disease)   . GERD (gastroesophageal reflux disease)   . Mental disorder   . Psychosis     ROS as above otherwise neg.    Medications reviewed. Current Outpatient Prescriptions  Medication Sig Dispense Refill  . allopurinol (ZYLOPRIM) 100 MG tablet Take 100 mg by mouth daily.       Marland Kitchen antipyrine-benzocaine (AURALGAN) otic solution Place 3-4 drops into both ears every 2 (two) hours as needed for ear pain.  10 mL  1  . cephALEXin (KEFLEX) 500 MG capsule Take 1 capsule (500 mg total) by mouth 3 (three) times daily.  30 capsule  0  . divalproex (DEPAKOTE ER) 500 MG 24 hr tablet take 1 tablet by mouth daily  30 tablet  2  . fluconazole (DIFLUCAN) 150 MG tablet Take 1 tablet (150 mg total) by mouth once. Repeat dose in 3 days  2 tablet  0  . hydrochlorothiazide (MICROZIDE) 12.5 MG capsule Take 1 capsule (12.5 mg total) by mouth daily.  30 capsule  6  . losartan (COZAAR) 50 MG tablet Take 1 tablet (50 mg total) by mouth daily.  90 tablet  3  . metoprolol tartrate (LOPRESSOR) 25 MG tablet Take 1.5 tablets (37.5 mg total) by mouth 2 (two) times daily.  90 tablet  11  . Nitroglycerin (NITROSTAT SL) Place 1 tablet under the tongue every  5 (five) minutes x 3 doses as needed. For chest      . OLANZapine (ZYPREXA) 15 MG tablet take 1/2 tablet by mouth at bedtime  15 tablet  2  . rosuvastatin (CRESTOR) 5 MG tablet Take 5 mg by mouth every evening.       . trihexyphenidyl (ARTANE) 2 MG tablet take 1 tablet by mouth at bedtime  30 tablet  1   No current facility-administered medications for this visit.    Exam:  BP 160/90  Pulse 99  Temp(Src) 97.7 F (36.5 C) (Oral)  Wt 208 lb (94.348 kg) Gen: Well NAD HEENT: EOMI,  MMM, Rexternal ear canal w/ mild injection TM bilat nml MSK: mild cva tenderness bilat    Results for orders placed in visit on 03/24/13 (from the past 72 hour(s))  POCT URINALYSIS DIPSTICK     Status: Abnormal   Collection Time    03/24/13  2:03 PM      Result Value Ref Range   Color, UA YELLOW     Clarity, UA CLOUDY     Glucose, UA NEG     Bilirubin, UA NEG     Ketones, UA NEG     Spec Grav, UA 1.020     Blood, UA MODERATE     pH,  UA 5.5     Protein, UA 100     Urobilinogen, UA 0.2     Nitrite, UA POSITIVE     Leukocytes, UA large (3+)      A/P (as seen in Problem list)  Dysuria UTI noted on UA today and symptomatic Keflex x 10 days due to CVA tenderness   Otitis externa Pt to start auralgan NSAIDs  Elevated blood pressure No change in regimen Question compliance and pt in pain today

## 2013-03-24 NOTE — Patient Instructions (Signed)
Please start the Keflex for your bladder infection OPlease start the diflucan for a yeast in fection after the antibioitics if you develop itching Please start the auralgan for the ear pain.  Come back to see me in 2 weeks for your knee pain

## 2013-03-26 LAB — URINE CULTURE: Colony Count: >=100000

## 2013-04-21 ENCOUNTER — Ambulatory Visit: Payer: Self-pay | Admitting: Family Medicine

## 2013-04-22 ENCOUNTER — Telehealth (HOSPITAL_COMMUNITY): Payer: Self-pay | Admitting: *Deleted

## 2013-04-22 ENCOUNTER — Ambulatory Visit (INDEPENDENT_AMBULATORY_CARE_PROVIDER_SITE_OTHER): Payer: Medicare Other | Admitting: Family Medicine

## 2013-04-22 ENCOUNTER — Encounter: Payer: Self-pay | Admitting: Family Medicine

## 2013-04-22 VITALS — BP 175/113 | HR 88 | Temp 97.4°F | Wt 243.0 lb

## 2013-04-22 DIAGNOSIS — M79609 Pain in unspecified limb: Secondary | ICD-10-CM

## 2013-04-22 DIAGNOSIS — IMO0002 Reserved for concepts with insufficient information to code with codable children: Secondary | ICD-10-CM

## 2013-04-22 DIAGNOSIS — I2510 Atherosclerotic heart disease of native coronary artery without angina pectoris: Secondary | ICD-10-CM

## 2013-04-22 DIAGNOSIS — R29898 Other symptoms and signs involving the musculoskeletal system: Secondary | ICD-10-CM

## 2013-04-22 DIAGNOSIS — I10 Essential (primary) hypertension: Secondary | ICD-10-CM | POA: Insufficient documentation

## 2013-04-22 DIAGNOSIS — R300 Dysuria: Secondary | ICD-10-CM

## 2013-04-22 DIAGNOSIS — F200 Paranoid schizophrenia: Secondary | ICD-10-CM

## 2013-04-22 DIAGNOSIS — Z79899 Other long term (current) drug therapy: Secondary | ICD-10-CM

## 2013-04-22 DIAGNOSIS — M1711 Unilateral primary osteoarthritis, right knee: Secondary | ICD-10-CM

## 2013-04-22 DIAGNOSIS — M238X1 Other internal derangements of right knee: Secondary | ICD-10-CM

## 2013-04-22 DIAGNOSIS — M1710 Unilateral primary osteoarthritis, unspecified knee: Secondary | ICD-10-CM

## 2013-04-22 DIAGNOSIS — M79604 Pain in right leg: Secondary | ICD-10-CM

## 2013-04-22 LAB — POCT URINALYSIS DIPSTICK
Bilirubin, UA: NEGATIVE
GLUCOSE UA: NEGATIVE
Ketones, UA: NEGATIVE
LEUKOCYTES UA: NEGATIVE
Nitrite, UA: NEGATIVE
Protein, UA: >=300
SPEC GRAV UA: 1.025
UROBILINOGEN UA: 0.2
pH, UA: 6.5

## 2013-04-22 LAB — CBC WITH DIFFERENTIAL/PLATELET
BASOS ABS: 0 10*3/uL (ref 0.0–0.1)
BASOS PCT: 0 % (ref 0–1)
EOS PCT: 1 % (ref 0–5)
Eosinophils Absolute: 0.1 10*3/uL (ref 0.0–0.7)
HCT: 40.2 % (ref 36.0–46.0)
Hemoglobin: 14.2 g/dL (ref 12.0–15.0)
Lymphocytes Relative: 26 % (ref 12–46)
Lymphs Abs: 2.9 10*3/uL (ref 0.7–4.0)
MCH: 29.2 pg (ref 26.0–34.0)
MCHC: 35.3 g/dL (ref 30.0–36.0)
MCV: 82.7 fL (ref 78.0–100.0)
MONO ABS: 0.8 10*3/uL (ref 0.1–1.0)
Monocytes Relative: 7 % (ref 3–12)
Neutro Abs: 7.4 10*3/uL (ref 1.7–7.7)
Neutrophils Relative %: 66 % (ref 43–77)
Platelets: 288 10*3/uL (ref 150–400)
RBC: 4.86 MIL/uL (ref 3.87–5.11)
RDW: 15.3 % (ref 11.5–15.5)
WBC: 11.2 10*3/uL — ABNORMAL HIGH (ref 4.0–10.5)

## 2013-04-22 LAB — COMPREHENSIVE METABOLIC PANEL
ALT: 9 U/L (ref 0–35)
AST: 15 U/L (ref 0–37)
Albumin: 4.2 g/dL (ref 3.5–5.2)
Alkaline Phosphatase: 63 U/L (ref 39–117)
BUN: 16 mg/dL (ref 6–23)
CALCIUM: 9.5 mg/dL (ref 8.4–10.5)
CHLORIDE: 99 meq/L (ref 96–112)
CO2: 22 mEq/L (ref 19–32)
CREATININE: 0.89 mg/dL (ref 0.50–1.10)
Glucose, Bld: 88 mg/dL (ref 70–99)
Potassium: 4.2 mEq/L (ref 3.5–5.3)
Sodium: 137 mEq/L (ref 135–145)
Total Bilirubin: 0.3 mg/dL (ref 0.2–1.2)
Total Protein: 6.9 g/dL (ref 6.0–8.3)

## 2013-04-22 LAB — POCT UA - MICROSCOPIC ONLY

## 2013-04-22 LAB — POCT GLYCOSYLATED HEMOGLOBIN (HGB A1C): Hemoglobin A1C: 5.4

## 2013-04-22 MED ORDER — LOSARTAN POTASSIUM 100 MG PO TABS: 50.0000 mg | ORAL_TABLET | Freq: Every day | ORAL | Status: DC

## 2013-04-22 NOTE — Assessment & Plan Note (Signed)
Hx of overactive bladder - will obtain UA and culture but only treat for positive culture. This was discussed with the pt and son

## 2013-04-22 NOTE — Progress Notes (Signed)
   Subjective:    Patient ID: Abigail Wallace, female    DOB: 11-23-1945, 68 y.o.   MRN: [id number]  HPI  68 yo with an extensive pmhx who presents with her son for multiple concerns including possible UTI, elevated BP, chronic but worsening right leg and back pain.    1) Hypertension - has been elevated for the last 2 weeks persistently - most of the time systolic is in the [id number] and diastolic in the [id number] but occasionally up to 120s - no ha, vision changes, cp, sob - on metoprolol, losartan and HCTZ - son administers meds and she gets them regularly - son isn't sure why the change.   2) leg and back pain - has had worsening of leg and back pain over the last month Right knee pops and locks and feels weak and right hip gets sore after not being moved and then when it moves it makes her cry out in pain.  - no weakness - inciting event- stopped moving around much and now spends the time laying in bed - has gained 30 lbs in the last 2 months  - son states he knows and it is because he has been giving both of them larger portions and unhealthy food as it is less expensive.  - no bowel or bladder dysfunction - never had any imaging.   3) ?UTI - feels like she has to go to the bathroom "all the time" - has had UTIs before - increased frequency and some dysuria now - however, hx of overactive bladder as well - no fevers, chills, back pain, nausea, vomiting, diarrhea    Review of Systems See above     Objective:   Physical Exam  Constitutional: She appears well-developed and well-nourished.  HENT:  Head: Normocephalic and atraumatic.  Tardive dyskinesia   Eyes: Conjunctivae are normal.  Neck: Neck supple.  Cardiovascular: Normal rate, regular rhythm, normal heart sounds and intact distal pulses.   Pulmonary/Chest: Effort normal and breath sounds normal. No respiratory distress. She has no wheezes. She has no rales. She exhibits no tenderness.  Abdominal: Soft. Bowel sounds  are normal. She exhibits no distension.  Musculoskeletal:       Right hip: She exhibits decreased range of motion (but difficult to examine due to pt in wheelchair. poor internal rotation and hip flexion). She exhibits no tenderness, no bony tenderness and no swelling. Decreased strength: 4/5 in hip flexors.       Right knee: She exhibits decreased range of motion (decreased flexion ). She exhibits no swelling, no effusion, no deformity, no bony tenderness and normal meniscus. Tenderness found. Medial joint line tenderness noted. No lateral joint line, no MCL, no LCL and no patellar tendon tenderness noted.  Crepitus in right knee  Neurological: She is alert.  Skin: Skin is warm and dry. No rash noted.  Psychiatric:  Poor insight and judgement   Filed Vitals:   04/22/13 1537  BP: 175/113  Pulse: 88  Temp: 97.4 F (36.3 C)  TempSrc: Oral  Weight: 243 lb (110.224 kg)      Assessment & Plan:

## 2013-04-22 NOTE — Assessment & Plan Note (Signed)
Has not been re-evaluated since 2012 and now with worsening per pt and son - suspect this is more due to deconditioning and muscle breakdown in the setting of osteoarthritis - will obtain an xray to evaluate for worsening and any hairline fractures more as some of her psych medications are known to cause osteopenia/porosis (however, I highly doubt this) - recommended PT as pt is home bound and we could get resources to her but pt refuses  - agrees to walk more around the house and use low weights

## 2013-04-22 NOTE — Assessment & Plan Note (Signed)
Uncontrolled HTN currently addressed as below - on ASA - continuing to follow with cardiology

## 2013-04-22 NOTE — Assessment & Plan Note (Signed)
Uncontrolled today and on values from home Increase losartan to 100mg  daily Cont HCTZ and metoprolol at current doses Check at home and f/u in 2 weeks with values

## 2013-04-22 NOTE — Patient Instructions (Signed)
1) for the blood pressure - increase your losartan to 100mg  daily - keep on your other medications as scheduled  2) we can check your urine today for a bladder infection  3) for the leg pain  - we will get an xray of your right knee and hip

## 2013-04-22 NOTE — Assessment & Plan Note (Signed)
See reasoning as with arthritis in the knee - xray of hip and leg to eval for possible fx - recommend strengthening as above - no indication currently for medication until we work on conditioning first.  - son agrees with this   However, during our discussion, pt stated she had no reason to do what this "dumb young doctor" was telling her so I question compliance with any of the above.

## 2013-04-22 NOTE — Telephone Encounter (Signed)
Patient left VM:Has a question about her appointment.Also has blood work that Dr. Adele Schilder wanted done.Is going to try to get it done at medical doctor appt tomorrow.Not sure what he wants Contacted patient-Patient states she found paperwork with labs requested on it.Will take to [facility] appt today to see if they will do it there.Has limited options for transportation. Gvien date/time of next appt.Verbalized understanding

## 2013-04-23 LAB — VALPROIC ACID LEVEL: Valproic Acid Lvl: 45.5 ug/mL — ABNORMAL LOW (ref 50.0–100.0)

## 2013-04-29 ENCOUNTER — Telehealth: Payer: Self-pay | Admitting: Family Medicine

## 2013-04-29 NOTE — Telephone Encounter (Signed)
Labs looked good. No UTI. You can let her know all the results if you would like :)  Thanks!

## 2013-04-29 NOTE — Telephone Encounter (Signed)
Pt called and would like her test results. jw °

## 2013-04-29 NOTE — Telephone Encounter (Signed)
Will forward to Dr. Olevia Bowens who ordered the labs. Jazmin Hartsell,CMA

## 2013-04-30 NOTE — Telephone Encounter (Signed)
LMOVM for pt to return call. Abigail Wallace  

## 2013-05-05 ENCOUNTER — Telehealth: Payer: Self-pay | Admitting: Family Medicine

## 2013-05-05 NOTE — Telephone Encounter (Signed)
Pt called because she was seen by Dr. Olevia Bowens and was given a paper stating when and where to have xray's performed. She has lost the paper and now needs to know when and where she should do her x-rays. jw

## 2013-05-05 NOTE — Telephone Encounter (Signed)
Pt informed that xrays are walkin, she is agreeable. Laban Emperor Tacha Manni

## 2013-05-11 ENCOUNTER — Telehealth: Payer: Self-pay | Admitting: Family Medicine

## 2013-05-11 NOTE — Telephone Encounter (Signed)
Pt called and would like her test results and also she is having chills and would like something called in for that. jw

## 2013-05-13 NOTE — Telephone Encounter (Signed)
Pt is aware of results.  States that she is still having urinary symptoms and would like something called in.  She is dependent on someone else to bring her or rides scat but unable to get a ride soon.  Please advise if you can call her something in or if she needs further evaluation. Abigail Wallace,CMA

## 2013-05-13 NOTE — Telephone Encounter (Signed)
I assume she's referring to her UA. It was not sent for culture but in reading through [facility] note and looking at the labs I would say that her there is no overt sign of UTI. Sample was contaminated.   Her A1c was normal Other labs were basically normal  If she's having dysuria and frequency then I will consider ABX, but would prefer she come in for visit.

## 2013-05-18 ENCOUNTER — Other Ambulatory Visit (HOSPITAL_COMMUNITY): Payer: Self-pay | Admitting: Psychiatry

## 2013-05-18 MED ORDER — PHENAZOPYRIDINE HCL 100 MG PO TABS: 100.0000 mg | ORAL_TABLET | Freq: Three times a day (TID) | ORAL | Status: DC | PRN

## 2013-05-18 NOTE — Telephone Encounter (Signed)
Pt is aware of this and will be using something OTC for pain since rx cost her $40.  She is on a fixed income and couldn't afford the rx.  Lendell Gallick,CMA

## 2013-05-20 ENCOUNTER — Other Ambulatory Visit (HOSPITAL_COMMUNITY): Payer: Self-pay | Admitting: Psychiatry

## 2013-05-25 ENCOUNTER — Encounter (HOSPITAL_COMMUNITY): Payer: Self-pay | Admitting: Psychiatry

## 2013-05-25 ENCOUNTER — Ambulatory Visit (INDEPENDENT_AMBULATORY_CARE_PROVIDER_SITE_OTHER): Payer: Medicare Other | Admitting: Psychiatry

## 2013-05-25 VITALS — BP 158/88 | HR 70 | Wt 240.0 lb

## 2013-05-25 DIAGNOSIS — F200 Paranoid schizophrenia: Secondary | ICD-10-CM

## 2013-05-25 MED ORDER — DIVALPROEX SODIUM ER 500 MG PO TB24: ORAL_TABLET | ORAL | Status: DC

## 2013-05-25 MED ORDER — OLANZAPINE 5 MG PO TABS: ORAL_TABLET | ORAL | Status: DC

## 2013-05-25 NOTE — Patient Instructions (Signed)
Discontinue Artane, Continue Divalproex at present dose. Reduce Olanzapine 5 mg at bed time.

## 2013-05-25 NOTE — Progress Notes (Signed)
[name] [phone] Progress Note  Abigail Wallace [id number] 68 y.o.  05/25/2013 1:56 PM  Chief Complaint:  I don't like weight gain.  I have a lot of blurry vision .          History of Present Illness:  Abigail Wallace came for her followup appointment.  She came by herself.  She usually comes with her son.  She is taking her medication but complaining of blurry vision and breaking.  She is sleeping good.  She denies any agitation or any anger however she continues to have some time disorganized thinking and paranoia.  She reported her tremors are improved since the dose of the medicine is reduced.  She is taking olanzapine 7.5 mg.  Patient denies any recent hallucination or any aggressive behavior.  She admitted some time paranoia believing people in the house however she is not acting on these paranoid thinking.  Her thinking is slow .  She recently had blood work.  She seen her primary care physician for her chronic health issues.  She also had UTI.  Her medications were adjusted.  Her Depakote level is subtherapeutic but she is not interested to take higher dose.  Patient is not drinking alcohol or using any illegal substances.    Suicidal Ideation: No Plan Formed: No Patient has means to carry out plan: No  Homicidal Ideation: No Plan Formed: No Patient has means to carry out plan: No  Review of Systems  Eyes: Positive for blurred vision.  Neurological: Positive for tremors.    Psychiatric: Agitation: No Hallucination: No Depressed Mood: No Insomnia: Yes Hypersomnia: No Altered Concentration: No Feels Worthless: No Grandiose Ideas: No Belief In Special Powers: No New/Increased Substance Abuse: No Compulsions: No  Neurologic: Headache: Yes Seizure: No Paresthesias: No  Medical history She is seeing [facility].  Her cardiologist is Dr. Rollene Fare .  She has a history of GERD COPD hypertension gout and coronary artery disease.  She has a  pacemaker.  Family and Social History: Patient was born in raised in [place].  Patient denies any history of physical sexual or verbal abuse.  She has 3 children and 6 grandchildren.  Patient lives by herself in her apartment however she has a good support from her son.  Her son usually comes with her on appointments.   Outpatient Encounter Prescriptions as of 05/25/2013  Medication Sig  . allopurinol (ZYLOPRIM) 100 MG tablet Take 100 mg by mouth daily.   Marland Kitchen antipyrine-benzocaine (AURALGAN) otic solution Place 3-4 drops into both ears every 2 (two) hours as needed for ear pain.  . divalproex (DEPAKOTE ER) 500 MG 24 hr tablet take 1 tablet by mouth daily  . fluconazole (DIFLUCAN) 150 MG tablet Take 1 tablet (150 mg total) by mouth once. Repeat dose in 3 days  . hydrochlorothiazide (MICROZIDE) 12.5 MG capsule Take 1 capsule (12.5 mg total) by mouth daily.  Marland Kitchen losartan (COZAAR) 100 MG tablet Take 0.5 tablets (50 mg total) by mouth daily.  . metoprolol tartrate (LOPRESSOR) 25 MG tablet Take 1.5 tablets (37.5 mg total) by mouth 2 (two) times daily.  . Nitroglycerin (NITROSTAT SL) Place 1 tablet under the tongue every 5 (five) minutes x 3 doses as needed. For chest  . OLANZapine (ZYPREXA) 5 MG tablet take 1/2 tablet by mouth at bedtime  . phenazopyridine (PYRIDIUM) 100 MG tablet Take 1 tablet (100 mg total) by mouth 3 (three) times daily as needed for pain.  . rosuvastatin (CRESTOR) 5 MG  tablet Take 5 mg by mouth every evening.   . [DISCONTINUED] cephALEXin (KEFLEX) 500 MG capsule Take 1 capsule (500 mg total) by mouth 3 (three) times daily.  . [DISCONTINUED] divalproex (DEPAKOTE ER) 500 MG 24 hr tablet take 1 tablet by mouth daily  . [DISCONTINUED] OLANZapine (ZYPREXA) 15 MG tablet take 1/2 tablet by mouth at bedtime  . [DISCONTINUED] trihexyphenidyl (ARTANE) 2 MG tablet take 1 tablet by mouth at bedtime    Past Psychiatric History/Hospitalization(s): Patient was admitted at [facility] 20  years ago due to severe psychosis.  She has one suicidal attempt when she tried to cut her wrist .  In the past he had tried Haldol, Seroquel, Risperdal,  Abilify and clozapine.  Patient has a bad reaction with Abilify.  Patient has history of anger and severe mood swing.  She has history of mania and depression.   Anxiety: Yes Bipolar Disorder: No Depression: No Mania: Yes Psychosis: Yes Schizophrenia: Yes Personality Disorder: No Hospitalization for psychiatric illness: Yes History of Electroconvulsive Shock Therapy: No Prior Suicide Attempts: Yes  Physical Exam: Constitutional:  BP 158/88  Pulse 70  Wt 240 lb (108.863 kg)  Recent Results (from the past 2160 hour(s))  POCT URINALYSIS DIPSTICK     Status: Abnormal   Collection Time    03/24/13  2:03 PM      Result Value Ref Range   Color, UA YELLOW     Clarity, UA CLOUDY     Glucose, UA NEG     Bilirubin, UA NEG     Ketones, UA NEG     Spec Grav, UA 1.020     Blood, UA MODERATE     pH, UA 5.5     Protein, UA 100     Urobilinogen, UA 0.2     Nitrite, UA POSITIVE     Leukocytes, UA large (3+)    URINE CULTURE     Status: None   Collection Time    03/24/13  2:48 PM      Result Value Ref Range   Culture ESCHERICHIA COLI     Colony Count >=100,000 COLONIES/ML     Organism ID, Bacteria ESCHERICHIA COLI    CBC WITH DIFFERENTIAL     Status: Abnormal   Collection Time    04/22/13  4:25 PM      Result Value Ref Range   WBC 11.2 (*) 4.0 - 10.5 K/uL   RBC 4.86  3.87 - 5.11 MIL/uL   Hemoglobin 14.2  12.0 - 15.0 g/dL   HCT 40.2  36.0 - 46.0 %   MCV 82.7  78.0 - 100.0 fL   MCH 29.2  26.0 - 34.0 pg   MCHC 35.3  30.0 - 36.0 g/dL   RDW 15.3  11.5 - 15.5 %   Platelets 288  150 - 400 K/uL   Neutrophils Relative % 66  43 - 77 %   Neutro Abs 7.4  1.7 - 7.7 K/uL   Lymphocytes Relative 26  12 - 46 %   Lymphs Abs 2.9  0.7 - 4.0 K/uL   Monocytes Relative 7  3 - 12 %   Monocytes Absolute 0.8  0.1 - 1.0 K/uL   Eosinophils Relative 1   0 - 5 %   Eosinophils Absolute 0.1  0.0 - 0.7 K/uL   Basophils Relative 0  0 - 1 %   Basophils Absolute 0.0  0.0 - 0.1 K/uL   Smear Review Criteria for review not met  COMPREHENSIVE METABOLIC PANEL     Status: None   Collection Time    04/22/13  4:25 PM      Result Value Ref Range   Sodium 137  135 - 145 mEq/L   Potassium 4.2  3.5 - 5.3 mEq/L   Chloride 99  96 - 112 mEq/L   CO2 22  19 - 32 mEq/L   Glucose, Bld 88  70 - 99 mg/dL   BUN 16  6 - 23 mg/dL   Creat 0.89  0.50 - 1.10 mg/dL   Total Bilirubin 0.3  0.2 - 1.2 mg/dL   Alkaline Phosphatase 63  39 - 117 U/L   AST 15  0 - 37 U/L   ALT 9  0 - 35 U/L   Total Protein 6.9  6.0 - 8.3 g/dL   Albumin 4.2  3.5 - 5.2 g/dL   Calcium 9.5  8.4 - 10.5 mg/dL  VALPROIC ACID LEVEL     Status: Abnormal   Collection Time    04/22/13  4:25 PM      Result Value Ref Range   Valproic Acid Lvl 45.5 (*) 50.0 - 100.0 ug/mL  POCT GLYCOSYLATED HEMOGLOBIN (HGB A1C)     Status: None   Collection Time    04/22/13  4:27 PM      Result Value Ref Range   Hemoglobin A1C 5.4    POCT URINALYSIS DIPSTICK     Status: Abnormal   Collection Time    04/22/13  4:27 PM      Result Value Ref Range   Color, UA YELLOW     Clarity, UA CLEAR     Glucose, UA NEG     Bilirubin, UA NEG     Ketones, UA NEG     Spec Grav, UA 1.025     Blood, UA TRACE-INTACT     pH, UA 6.5     Protein, UA >=300     Urobilinogen, UA 0.2     Nitrite, UA NEG     Leukocytes, UA Negative    POCT UA - MICROSCOPIC ONLY     Status: None   Collection Time    04/22/13  4:27 PM      Result Value Ref Range   WBC, Ur, HPF, POC 1-5     RBC, urine, microscopic 0-3     Bacteria, U Microscopic 1+     Epithelial cells, urine per micros 5-10       General Appearance: alert, oriented, no acute distress, she is fairly groomed and uses wheelchair as she has a balance issue.  She has mild tremors and shakes .    Musculoskeletal: Strength & Muscle Tone: decreased Gait & Station:  unsteady Patient leans: N/A  Mental status examination Patient is casually dressed and fairly groomed.  She uses wheelchair because of balance issue.  Her speech is slow and nonspontaneous .  She endorsed paranoia but denies any hallucination .  She denies any active or passive suicidal thoughts or homicidal thoughts.  Attention concentration is distracted.  Her thought processes slow and sometimes circumstantial.  Her fund of knowledge is average.  Her psychomotor activity is slow.  There were mild tremors .  Her cognition is fair.  She is alert and oriented x3 however she has difficulty remembering about her past.  She described her mood is okay and her affect is labile.  Her insight judgment and impulse control is fair.  Established Problem, Stable/Improving (1), Review of Psycho-Social Stressors (  1), Review or order clinical lab tests (1), Review of Last Therapy Session (1), Review of Medication Regimen & Side Effects (2) and Review of New Medication or Change in Dosage (2)  Assessment: Axis I: Schizophrenia paranoid type  Axis II: Deferred  Axis III: See medical history  Axis IV: Mild to moderate, chronic illness  Axis V: 50-55   Plan:   patient is experiencing side effects from Artane as she is complaining about blurry vision .  Her tremors improved from the past.  She is very concerned about the dosage of medication.  She wants to try low-dose olanzapine .  She has gained weight from the past.  I reviewed her blood work results .  Her Depakote level is 45 which is Sub therapeutic but she does not want to higher doses.  Her hemoglobin A1c is 5.4. I will reduced olanzapine to 5 mg and discontinue Artane.  Continue Depakote present dose.  I recommended if she started to have irritability anger and mood swing and she should call us immediately.  I will see her again in 3 months. Time spent 25 minutes.  More than 50% of the time spent in psychoeducation, counseling and coordination of care.   Discuss safety plan that anytime having active suicidal thoughts or homicidal thoughts then patient need to call 911 or go to the local emergency room.  Jereline Ticer T., MD 05/25/2013

## 2013-05-29 ENCOUNTER — Ambulatory Visit
Admission: RE | Admit: 2013-05-29 | Discharge: 2013-05-29 | Disposition: A | Discharge status: Home / Self Care | Payer: Medicare Other | Source: Ambulatory Visit | Attending: Family Medicine | Admitting: Family Medicine

## 2013-05-29 DIAGNOSIS — M238X1 Other internal derangements of right knee: Secondary | ICD-10-CM

## 2013-05-29 DIAGNOSIS — M79604 Pain in right leg: Secondary | ICD-10-CM

## 2013-06-02 ENCOUNTER — Telehealth: Payer: Self-pay | Admitting: Family Medicine

## 2013-06-02 NOTE — Telephone Encounter (Signed)
Would like x-ray results

## 2013-06-02 NOTE — Telephone Encounter (Signed)
Will forward to Dr. Olevia Bowens for xray results. Jazmin Hartsell,CMA

## 2013-06-03 NOTE — Telephone Encounter (Signed)
No fractures. She has bad arthritis in her hip.  It might be worth her talking to her PCP about options in the future.  Thanks!

## 2013-06-04 NOTE — Telephone Encounter (Signed)
LM with son for patient to call back.  Please inform of message when she does.  Thanks [employer]

## 2013-06-10 ENCOUNTER — Telehealth: Payer: Self-pay | Admitting: Cardiovascular Disease

## 2013-06-10 NOTE — Telephone Encounter (Signed)
Appt made for 6-8 @ 2pm with [facility].

## 2013-06-10 NOTE — Telephone Encounter (Signed)
Calling because her pacemaker bolts up on her at night. Please Call   Thanks

## 2013-06-11 ENCOUNTER — Ambulatory Visit (INDEPENDENT_AMBULATORY_CARE_PROVIDER_SITE_OTHER): Payer: Medicare Other | Admitting: Family Medicine

## 2013-06-11 ENCOUNTER — Encounter: Payer: Self-pay | Admitting: Family Medicine

## 2013-06-11 VITALS — BP 146/85 | HR 85 | Temp 98.4°F | Wt 233.0 lb

## 2013-06-11 DIAGNOSIS — M1610 Unilateral primary osteoarthritis, unspecified hip: Secondary | ICD-10-CM

## 2013-06-11 DIAGNOSIS — M169 Osteoarthritis of hip, unspecified: Secondary | ICD-10-CM

## 2013-06-11 DIAGNOSIS — R300 Dysuria: Secondary | ICD-10-CM

## 2013-06-11 DIAGNOSIS — I10 Essential (primary) hypertension: Secondary | ICD-10-CM

## 2013-06-11 LAB — POCT UA - MICROSCOPIC ONLY: Epithelial cells, urine per micros: >20

## 2013-06-11 LAB — POCT URINALYSIS DIPSTICK
Bilirubin, UA: NEGATIVE
Glucose, UA: NEGATIVE
Ketones, UA: NEGATIVE
LEUKOCYTES UA: NEGATIVE
NITRITE UA: NEGATIVE
PH UA: 6
Spec Grav, UA: 1.02
UROBILINOGEN UA: 0.2

## 2013-06-11 MED ORDER — CEPHALEXIN 500 MG PO CAPS: 500.0000 mg | ORAL_CAPSULE | Freq: Two times a day (BID) | ORAL | Status: DC

## 2013-06-11 MED ORDER — FLUCONAZOLE 150 MG PO TABS: 150.0000 mg | ORAL_TABLET | Freq: Once | ORAL | Status: DC

## 2013-06-11 MED ORDER — PHENAZOPYRIDINE HCL 100 MG PO TABS: 100.0000 mg | ORAL_TABLET | Freq: Three times a day (TID) | ORAL | Status: DC | PRN

## 2013-06-11 NOTE — Assessment & Plan Note (Signed)
At goal.  No change 

## 2013-06-11 NOTE — Patient Instructions (Signed)
You are doing well overall. Please start the keflex for a UTI and use the pyridium for irritation The orthopedic office will call yiou for an apopintment Please start tylenol 1gm every 8 hours for the pain

## 2013-06-11 NOTE — Addendum Note (Signed)
Addended by: [place], Nechemia Chiappetta on: 06/11/2013 05:27 PM   Modules accepted: Orders

## 2013-06-11 NOTE — Progress Notes (Signed)
Abigail Wallace is a 68 y.o. female who presents to [facility] today for UTI  Dysuria: burhning sensation. oingoing for 3-4wks. Urinating about 20x per day. Intermittent lower abdominal pain. Denies fevers, n/v. H/o bladder prolapse.   R hip pain: takes tylenol intermittently. Gradually getting worse. Wheelchair bound  The following portions of the patient's history were reviewed and updated as appropriate: allergies, current medications, past medical history, family and social history, and problem list.  Patient is a nonsmoker  Past Medical History  Diagnosis Date  . Cystitis     Frequent episodes.   . Gout   . Coronary artery disease   . COPD (chronic obstructive pulmonary disease)   . GERD (gastroesophageal reflux disease)   . Mental disorder   . Psychosis     ROS as above otherwise neg.    Medications reviewed. Current Outpatient Prescriptions  Medication Sig Dispense Refill  . allopurinol (ZYLOPRIM) 100 MG tablet Take 100 mg by mouth daily.       Marland Kitchen antipyrine-benzocaine (AURALGAN) otic solution Place 3-4 drops into both ears every 2 (two) hours as needed for ear pain.  10 mL  1  . cephALEXin (KEFLEX) 500 MG capsule Take 1 capsule (500 mg total) by mouth 2 (two) times daily.  14 capsule  0  . divalproex (DEPAKOTE ER) 500 MG 24 hr tablet take 1 tablet by mouth daily  30 tablet  2  . fluconazole (DIFLUCAN) 150 MG tablet Take 1 tablet (150 mg total) by mouth once. Repeat dose in 3 days  2 tablet  0  . hydrochlorothiazide (MICROZIDE) 12.5 MG capsule Take 1 capsule (12.5 mg total) by mouth daily.  30 capsule  6  . losartan (COZAAR) 100 MG tablet Take 0.5 tablets (50 mg total) by mouth daily.  90 tablet  1  . metoprolol tartrate (LOPRESSOR) 25 MG tablet Take 1.5 tablets (37.5 mg total) by mouth 2 (two) times daily.  90 tablet  11  . Nitroglycerin (NITROSTAT SL) Place 1 tablet under the tongue every 5 (five) minutes x 3 doses as needed. For chest      . OLANZapine (ZYPREXA) 5 MG tablet take  1/2 tablet by mouth at bedtime  30 tablet  2  . phenazopyridine (PYRIDIUM) 100 MG tablet Take 1 tablet (100 mg total) by mouth 3 (three) times daily as needed for pain.  30 tablet  3  . rosuvastatin (CRESTOR) 5 MG tablet Take 5 mg by mouth every evening.        No current facility-administered medications for this visit.    Exam:  BP 146/85  Pulse 85  Temp(Src) 98.4 F (36.9 C) (Oral)  Wt 233 lb (105.688 kg) Gen: Well NAD HEENT: EOMI,  MMM  No results found for this or any previous visit (from the past 72 hour(s)).  A/P (as seen in Problem list)  Uncontrolled hypertension At goal. No change  OSTEOARTHRITIS, HIP, RIGHT Severe degenerative changes seen on xray. Needs ortho consult for possible replacement though not sure pt is a surgical candidate  Dysuria Pt at high risk for UTI adn h/o recurrence given bladder prolapse into vagina and poor hygene in that she does not wipe after urinating UA w/ culture Keflex until Cx returns Pyridium

## 2013-06-11 NOTE — Assessment & Plan Note (Signed)
Pt at high risk for UTI adn h/o recurrence given bladder prolapse into vagina and poor hygene in that she does not wipe after urinating UA w/ culture Keflex until Cx returns Pyridium

## 2013-06-11 NOTE — Assessment & Plan Note (Addendum)
Severe degenerative changes seen on xray. Needs ortho consult for possible replacement though not sure pt is a surgical candidate Tylenol 1 gm Q8

## 2013-06-11 NOTE — Addendum Note (Signed)
Addended by: Valerie Roys on: 06/11/2013 04:54 PM   Modules accepted: Orders

## 2013-06-12 ENCOUNTER — Telehealth: Payer: Self-pay | Admitting: Family Medicine

## 2013-06-12 NOTE — Telephone Encounter (Signed)
Pt called because the medication that was prescribed on 5/28  ( diflucan) is not covered by her insurance. Can something that will be covered by her insurance be called in ? Also the prescription for her shower chair has to be faxed to [facility] and it also must have the reason she needs the shower chair on there. jw

## 2013-06-12 NOTE — Telephone Encounter (Signed)
Will forward to MD.  Pt has medicare part A and B (chair needs [facility] certified signature).  Abigail Wallace,CMA

## 2013-06-15 NOTE — Telephone Encounter (Signed)
Pt called to check the status of her refill request of something else being called in. Also make sure that the chair called in is the one that goes in the shower. jw

## 2013-06-15 NOTE — Telephone Encounter (Signed)
Dr. Gwendlyn Deutscher, Can you assist with this Rx for the shower chair as I do not have a [place] number? Let me know if you need more information. Thanks

## 2013-06-15 NOTE — Telephone Encounter (Signed)
Rx written and given to Dr Gwendlyn Deutscher for [facility] certification

## 2013-06-15 NOTE — Telephone Encounter (Signed)
Sure I can, if the paper work is made available to me.

## 2013-06-17 ENCOUNTER — Other Ambulatory Visit: Payer: Self-pay | Admitting: *Deleted

## 2013-06-17 MED ORDER — LOSARTAN POTASSIUM 100 MG PO TABS: 50.0000 mg | ORAL_TABLET | Freq: Every day | ORAL | Status: DC

## 2013-06-22 ENCOUNTER — Ambulatory Visit (INDEPENDENT_AMBULATORY_CARE_PROVIDER_SITE_OTHER): Payer: Medicare Other | Admitting: Cardiovascular Disease

## 2013-06-22 ENCOUNTER — Encounter: Payer: Self-pay | Admitting: Cardiovascular Disease

## 2013-06-22 VITALS — BP 170/94 | HR 70 | Resp 16 | Ht 66.0 in | Wt 242.0 lb

## 2013-06-22 DIAGNOSIS — Z950 Presence of cardiac pacemaker: Secondary | ICD-10-CM

## 2013-06-22 DIAGNOSIS — I442 Atrioventricular block, complete: Secondary | ICD-10-CM | POA: Diagnosis not present

## 2013-06-22 DIAGNOSIS — R079 Chest pain, unspecified: Secondary | ICD-10-CM

## 2013-06-22 DIAGNOSIS — I2510 Atherosclerotic heart disease of native coronary artery without angina pectoris: Secondary | ICD-10-CM

## 2013-06-22 DIAGNOSIS — I495 Sick sinus syndrome: Secondary | ICD-10-CM | POA: Diagnosis not present

## 2013-06-22 DIAGNOSIS — I10 Essential (primary) hypertension: Secondary | ICD-10-CM

## 2013-06-22 DIAGNOSIS — E7800 Pure hypercholesterolemia, unspecified: Secondary | ICD-10-CM

## 2013-06-22 LAB — PACEMAKER DEVICE OBSERVATION

## 2013-06-22 MED ORDER — METOPROLOL TARTRATE 50 MG PO TABS: 50.0000 mg | ORAL_TABLET | Freq: Two times a day (BID) | ORAL | Status: DC

## 2013-06-22 NOTE — Assessment & Plan Note (Signed)
Her last lipid profile shows very satisfactory parameters on Crestor

## 2013-06-22 NOTE — Assessment & Plan Note (Signed)
Her blood pressure remains high despite the recent titration of metoprolol. We'll increase the metoprolol further to 50 mg twice a day.

## 2013-06-22 NOTE — Progress Notes (Signed)
Patient ID: Abigail Wallace, female   DOB: Jun 29, 1945, 68 y.o.   MRN: [id number]      Reason for office visit Pacemaker followup  This is my first encounter with Abigail Wallace. She is a 68 year old woman who was formerly a patient of Dr. Terance Ice, until his recent retirement.  She has a history of schizophrenia with psychosis requiring hospitalization in 2012. Since then she has done well and lives independently. Her son accompanies her to the visit today and lives close by.  She received a dual-chamber permanent pacemaker in 2007 for symptomatic AV block. Her device is a Vitatron T. 71 DR. Serial number [id number], approaching elective replacement indicator (estimated at less than 0.5 years).  She has 100% ventricular pacing but does have an underlying ventricular rhythm at about 37 beats per minute. She also paces the atrium 87% of the time. The lead parameters are excellent atrial lead sensed P waves greater than 1.5 mV, threshold 0.5 V at 0.4 ms pulse width, impedance 600 ohms (bipolar). Ventricular lead sensed R waves greater than 8 mV, threshold 0.625 V at 0.4 ms pulse width, impedance 550 ohms (bipolar).  Electrocardiogram shows AV sequential paced rhythm with constant artifact due to tremor (possible tardive dyskinesia).  She has minimal coronary atherosclerosis by cardiac catheterization performed in 2007 and no evidence of insufficiency by nuclear perfusion testing in 2012. By echocardiography she has normal left ventricular size and systolic function and no major structural cardiac abnormalities.  Noncardiac history includes chronic back pain, obesity and orthostatic hypotension. She smokes roughly 5 cigarettes a day and has been diagnosed with COPD, but I don't think this has been formally documented.  She has 68 rare complaints of dizziness and lower extremity edema, mild exertional dyspnea. The pacemaker site feels uncomfortable especially when she turns over in her left side.  There has been substantial migration of the device into her breast, but the scar of initial implantation is also unusually low. Her blood pressure has been consistently high. Her blood pressures oscillated 145/92-188/120 at home. Her metoprolol dose was increased roughly a month and a half ago. Her pressure remains high in the office today.   Allergies  Allergen Reactions  . Aripiprazole Anaphylaxis    Tremors  . Benadryl [Diphenhydramine Hcl] Other (See Comments)    States affects her vision  . Remeron [Mirtazapine] Other (See Comments)    Tremors and shakes  . Codeine Other (See Comments)    Unknown   . Cogentin [Benztropine] Other (See Comments)    Affects mobility  . Haloperidol Lactate     Vision changes  . Latuda [Lurasidone Hcl]     "Tremors and shakes."   . Latex Rash    Current Outpatient Prescriptions  Medication Sig Dispense Refill  . allopurinol (ZYLOPRIM) 100 MG tablet Take 100 mg by mouth daily.       . cephALEXin (KEFLEX) 500 MG capsule Take 1 capsule (500 mg total) by mouth 2 (two) times daily.  14 capsule  0  . divalproex (DEPAKOTE ER) 500 MG 24 hr tablet take 1 tablet by mouth daily  30 tablet  2  . fluconazole (DIFLUCAN) 150 MG tablet Take 1 tablet (150 mg total) by mouth once. Repeat dose in 3 days  2 tablet  0  . hydrochlorothiazide (MICROZIDE) 12.5 MG capsule Take 1 capsule (12.5 mg total) by mouth daily.  30 capsule  6  . losartan (COZAAR) 100 MG tablet Take 0.5 tablets (50 mg total) by mouth daily.  [place]  tablet  1  . Nitroglycerin (NITROSTAT SL) Place 1 tablet under the tongue every 5 (five) minutes x 3 doses as needed. For chest      . OLANZapine (ZYPREXA) 5 MG tablet take 1/2 tablet by mouth at bedtime  30 tablet  2  . phenazopyridine (PYRIDIUM) 100 MG tablet Take 1 tablet (100 mg total) by mouth 3 (three) times daily as needed for pain.  30 tablet  3  . rosuvastatin (CRESTOR) 5 MG tablet Take 5 mg by mouth every evening.       . metoprolol (LOPRESSOR) 50 MG  tablet Take 1 tablet (50 mg total) by mouth 2 (two) times daily.  30 tablet  6   No current facility-administered medications for this visit.    Past Medical History  Diagnosis Date  . Cystitis     Frequent episodes.   . Gout   . Coronary artery disease   . COPD (chronic obstructive pulmonary disease)   . GERD (gastroesophageal reflux disease)   . Mental disorder   . Psychosis     Past Surgical History  Procedure Laterality Date  . Pacemaker placement  09/28/2005    2/2 SSS?  Marland Kitchen Cholecystectomy    . Tubal ligation    . Cardiac catheterization  2007    Non-critical.   . Persantine stress test  05/01/2010    EF 66%. Normal LV sys fx. Unchanged from previous studies.   . Insert / replace / remove pacemaker  2007    [place]    Family History  Problem Relation Age of Onset  . Diabetes type II Son   . Heart disease Mother   . Kidney disease Mother   . Heart disease Father   . Kidney disease Father     History   Social History  . Marital Status: Divorced    Spouse Name: N/A    Number of Children: N/A  . Years of Education: N/A   Occupational History  . Not on file.   Social History Main Topics  . Smoking status: Former Smoker -- 0.50 packs/day    Types: Cigarettes  . Smokeless tobacco: Not on file     Comment: electronic cigarettes  . Alcohol Use: No  . Drug Use: No  . Sexual Activity: Not on file   Other Topics Concern  . Not on file   Social History Narrative   Lives alone. Older sister lives nearby and helps take her to appointments and to take care of her; sister uses wheelchair, unsure why (thin and appears healthy).    Married.   Children: 2 daughters, 1 son; 51 GC, 5 GGC.    Occupation: disabled.    Caffeine:   Tobacco: 5-6/day   Denies alcohol.     Review of systems: The patient specifically denies any chest pain at rest or with exertion, dyspnea at rest, orthopnea, paroxysmal nocturnal dyspnea, syncope, palpitations, focal neurological  deficits, intermittent claudication, lower extremity edema at this time, unexplained weight gain, cough, hemoptysis or wheezing.  The patient also denies abdominal pain, nausea, vomiting, dysphagia, diarrhea, constipation, polyuria, polydipsia, dysuria, hematuria, frequency, urgency, abnormal bleeding or bruising, fever, chills, unexpected weight changes, mood swings, change in skin or hair texture, change in voice quality, auditory or visual problems, allergic reactions or rashes, new musculoskeletal complaints other than usual "aches and pains".   PHYSICAL EXAM BP 170/94  Pulse 70  Resp 16  Ht 5\' 6"  (1.676 m)  Wt 242 lb (109.77 kg)  BMI  39.08 kg/m2  General: Alert, oriented x3, no distress Head: no evidence of trauma, PERRL, EOMI, no exophtalmos or lid lag, no myxedema, no xanthelasma; normal ears, nose and oropharynx Neck: normal jugular venous pulsations and no hepatojugular reflux; brisk carotid pulses without delay and no carotid bruits Chest: clear to auscultation, no signs of consolidation by percussion or palpation, normal fremitus, symmetrical and full respiratory excursions. Pacemaker scar is unusually low in the left subclavian area and the device has migrated into the upper part of her breast. However the site appears very healthy without evidence of swelling or infection. Cardiovascular: normal position and quality of the apical impulse, regular rhythm, normal first and paradoxically split second heart sounds, no murmurs, rubs or gallops Abdomen: no tenderness or distention, no masses by palpation, no abnormal pulsatility or arterial bruits, normal bowel sounds, no hepatosplenomegaly Extremities: no clubbing, cyanosis or edema; 2+ radial, ulnar and brachial pulses bilaterally; 2+ right femoral, posterior tibial and dorsalis pedis pulses; 2+ left femoral, posterior tibial and dorsalis pedis pulses; no subclavian or femoral bruits Neurological: grossly nonfocal   EKG: AV sequential  pacing, considerable baseline artifact  Lipid Panel     Component Value Date/Time   CHOL 122 11/19/2010 0605   TRIG 73 11/19/2010 0605   HDL 45 11/19/2010 0605   CHOLHDL 2.7 11/19/2010 0605   VLDL 15 11/19/2010 0605   LDLCALC 62 11/19/2010 0605    BMET    Component Value Date/Time   NA 137 04/22/2013 1625   K 4.2 04/22/2013 1625   CL 99 04/22/2013 1625   CO2 22 04/22/2013 1625   GLUCOSE 88 04/22/2013 1625   BUN 16 04/22/2013 1625   CREATININE 0.89 04/22/2013 1625   CREATININE 0.72 11/23/2010 1215   CALCIUM 9.5 04/22/2013 1625   GFRNONAA 87* 11/23/2010 1215   GFRAA >90 11/23/2010 1215     ASSESSMENT AND PLAN CHB (complete heart block) Although there is a slow idioventricular escape rhythm, she should be considered pacemaker dependent  Pacemaker Her device is approaching elective replacement indicator, anticipated before the end of the year. We discussed the procedure and its possible complications. Will start checking her pacemaker battery voltage every 2 months.  HTN (hypertension) Her blood pressure remains high despite the recent titration of metoprolol. We'll increase the metoprolol further to 50 mg twice a day.  HYPERCHOLESTEROLEMIA Her last lipid profile shows very satisfactory parameters on Crestor   Orders Placed This Encounter  Procedures  . EKG 12-Lead   Meds ordered this encounter  Medications  . metoprolol (LOPRESSOR) 50 MG tablet    Sig: Take 1 tablet (50 mg total) by mouth 2 (two) times daily.    Dispense:  30 tablet    Refill:  6    Shawnee Higham  Sanda Klein, MD, [facility] [phone] office [phone] pager

## 2013-06-22 NOTE — Assessment & Plan Note (Signed)
Although there is a slow idioventricular escape rhythm, she should be considered pacemaker dependent

## 2013-06-22 NOTE — Assessment & Plan Note (Signed)
Her device is approaching elective replacement indicator, anticipated before the end of the year. We discussed the procedure and its possible complications. Will start checking her pacemaker battery voltage every 2 months.

## 2013-06-22 NOTE — Patient Instructions (Addendum)
Your physician recommends that you schedule a follow-up appointment in: 2 months with Dr.Croitoru + pacemaker check  Change Metoprolol to 50mg  TWICE DAILY

## 2013-06-23 LAB — MDC_IDC_ENUM_SESS_TYPE_INCLINIC
Brady Statistic RA Percent Paced: 87 %
Brady Statistic RV Percent Paced: 100 %
Lead Channel Impedance Value: 550 Ohm
Lead Channel Impedance Value: 600 Ohm
Lead Channel Pacing Threshold Amplitude: 0.5 V
Lead Channel Pacing Threshold Pulse Width: 0.4 ms
Lead Channel Sensing Intrinsic Amplitude: 1.5 mV
Lead Channel Sensing Intrinsic Amplitude: 8 mV
MDC IDC MSMT LEADCHNL RV PACING THRESHOLD AMPLITUDE: 0.625 V
MDC IDC MSMT LEADCHNL RV PACING THRESHOLD PULSEWIDTH: 0.4 ms
MDC IDC PG SERIAL: 2706033182
MDC IDC SET LEADCHNL RA PACING AMPLITUDE: 2 V
MDC IDC SET LEADCHNL RV PACING AMPLITUDE: 2 V
MDC IDC SET LEADCHNL RV PACING PULSEWIDTH: 0.4 ms
MDC IDC SET LEADCHNL RV SENSING SENSITIVITY: 2 mV

## 2013-07-02 ENCOUNTER — Ambulatory Visit: Payer: Self-pay | Admitting: Cardiovascular Disease

## 2013-07-04 ENCOUNTER — Telehealth: Payer: Self-pay | Admitting: Cardiovascular Disease

## 2013-07-04 ENCOUNTER — Encounter: Payer: Self-pay | Admitting: Cardiovascular Disease

## 2013-07-06 ENCOUNTER — Encounter: Payer: Self-pay | Admitting: Cardiovascular Disease

## 2013-07-09 ENCOUNTER — Telehealth (HOSPITAL_COMMUNITY): Payer: Self-pay

## 2013-07-13 ENCOUNTER — Other Ambulatory Visit: Payer: Self-pay | Admitting: *Deleted

## 2013-07-13 DIAGNOSIS — R030 Elevated blood-pressure reading, without diagnosis of hypertension: Principal | ICD-10-CM

## 2013-07-13 DIAGNOSIS — IMO0001 Reserved for inherently not codable concepts without codable children: Secondary | ICD-10-CM

## 2013-07-14 MED ORDER — HYDROCHLOROTHIAZIDE 12.5 MG PO CAPS: 12.5000 mg | ORAL_CAPSULE | Freq: Every day | ORAL | Status: DC

## 2013-07-20 NOTE — Telephone Encounter (Signed)
Closed enounter °

## 2013-07-21 ENCOUNTER — Other Ambulatory Visit: Payer: Self-pay

## 2013-07-21 MED ORDER — ROSUVASTATIN CALCIUM 5 MG PO TABS: 5.0000 mg | ORAL_TABLET | Freq: Every evening | ORAL | Status: DC

## 2013-07-21 MED ORDER — ALLOPURINOL 100 MG PO TABS: 100.0000 mg | ORAL_TABLET | Freq: Every day | ORAL | Status: DC

## 2013-07-21 NOTE — Telephone Encounter (Signed)
Rx was sent to pharmacy electronically.  Please advise Allopurinol refills.

## 2013-08-18 ENCOUNTER — Telehealth (HOSPITAL_COMMUNITY): Payer: Self-pay
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
## 2013-08-20 ENCOUNTER — Other Ambulatory Visit: Payer: Self-pay | Admitting: *Deleted

## 2013-08-20 MED ORDER — METOPROLOL TARTRATE 50 MG PO TABS: 50.0000 mg | ORAL_TABLET | Freq: Two times a day (BID) | ORAL | Status: DC

## 2013-08-26 ENCOUNTER — Encounter: Payer: Self-pay | Admitting: Cardiovascular Disease

## 2013-08-26 ENCOUNTER — Ambulatory Visit (HOSPITAL_COMMUNITY): Payer: Self-pay | Admitting: Psychiatry

## 2013-09-10 ENCOUNTER — Ambulatory Visit (INDEPENDENT_AMBULATORY_CARE_PROVIDER_SITE_OTHER): Payer: Medicare Other | Admitting: Psychiatry

## 2013-09-10 ENCOUNTER — Encounter (HOSPITAL_COMMUNITY): Payer: Self-pay | Admitting: Psychiatry

## 2013-09-10 VITALS — BP 162/92 | HR 71 | Ht 66.0 in | Wt 240.0 lb

## 2013-09-10 DIAGNOSIS — F200 Paranoid schizophrenia: Secondary | ICD-10-CM

## 2013-09-10 NOTE — Progress Notes (Signed)
[place] [phone] Progress Note  [name] [id number] 68 y.o.  09/10/2013 2:41 PM  Chief Complaint:  I don't want any medication.          History of Present Illness:  Abigail Wallace came for her followup appointment with her son.  She came by herself.  She is upset and angry her car she does not want to take any medication.  She is complaining of side effects including blurry vision, weight gain .  She admitted some time paranoia and believed that people having flashlights in the night at her window.  When I ask that she has psychiatric illness and she may require medication patient get more upset and reported that she just have bad nerves and she does not need any antipsychotic medication.  Patient has been difficult in recent months.  She's been requesting to come off on the medication and we have reduced the dose from olanzapine 15 mg to 5 mg. she is slowly decompensating.  Her son is concerned about her noncompliance with medication.  She gets easily irritable and angry.  However she denies any suicidal thoughts or homicidal thoughts.  She is sleeping on and off.  She is not interested to take any medication.  I had offer to try a different antipsychotic medication but patient refused.  Patient lives by herself.  Her son is very supportive and he is very involved in his treatment plan.  He also believed that patient require medication and without medication she will be further decompensated .  Patient became very upset when her son insist to take the medication.  She does not believe that she needs medication.  She wanted to see a different psychiatrist.  She is compliant with her other medication and recently seen her primary care physician .    Suicidal Ideation: No Plan Formed: No Patient has means to carry out plan: No  Homicidal Ideation: No Plan Formed: No Patient has means to carry out plan: No  Review of Systems  Eyes: Positive for blurred vision.  Neurological: Positive  for tremors.    Psychiatric: Agitation: No Hallucination: No Depressed Mood: No Insomnia: Yes Hypersomnia: No Altered Concentration: No Feels Worthless: No Grandiose Ideas: No Belief In Special Powers: Yes New/Increased Substance Abuse: No Compulsions: No  Neurologic: Headache: Yes Seizure: No Paresthesias: No  Medical history She is seeing [facility].  Her cardiologist is Dr. Rollene Fare .  She has a history of GERD COPD hypertension gout and coronary artery disease.  She has a pacemaker.  Family and Social History:  Patient was born in raised in [place].  Patient denies any history of physical sexual or verbal abuse.  She has 3 children and 6 grandchildren.  Patient lives by herself in her apartment however she has a good support from her son.  Her son usually comes with her on appointments.   Outpatient Encounter Prescriptions as of 09/10/2013  Medication Sig  . allopurinol (ZYLOPRIM) 100 MG tablet Take 1 tablet (100 mg total) by mouth daily.  . cephALEXin (KEFLEX) 500 MG capsule Take 1 capsule (500 mg total) by mouth 2 (two) times daily.  . divalproex (DEPAKOTE ER) 500 MG 24 hr tablet take 1 tablet by mouth daily  . fluconazole (DIFLUCAN) 150 MG tablet Take 1 tablet (150 mg total) by mouth once. Repeat dose in 3 days  . hydrochlorothiazide (MICROZIDE) 12.5 MG capsule Take 1 capsule (12.5 mg total) by mouth daily.  Marland Kitchen losartan (COZAAR) 100 MG tablet  Take 0.5 tablets (50 mg total) by mouth daily.  . metoprolol (LOPRESSOR) 50 MG tablet Take 1 tablet (50 mg total) by mouth 2 (two) times daily.  . Nitroglycerin (NITROSTAT SL) Place 1 tablet under the tongue every 5 (five) minutes x 3 doses as needed. For chest  . OLANZapine (ZYPREXA) 5 MG tablet take 1/2 tablet by mouth at bedtime  . phenazopyridine (PYRIDIUM) 100 MG tablet Take 1 tablet (100 mg total) by mouth 3 (three) times daily as needed for pain.  . rosuvastatin (CRESTOR) 5 MG tablet Take 1 tablet (5 mg  total) by mouth every evening.    Past Psychiatric History/Hospitalization(s): Patient was admitted at [facility] years ago due to severe psychosis.  She has one suicidal attempt when she tried to cut her wrist .  In the past he had tried Haldol, Seroquel, Risperdal,  Abilify and clozapine.  Patient has a bad reaction with Abilify.  Patient has history of anger and severe mood swing.  She has history of mania and depression.   Anxiety: Yes Bipolar Disorder: No Depression: No Mania: Yes Psychosis: Yes Schizophrenia: Yes Personality Disorder: No Hospitalization for psychiatric illness: Yes History of Electroconvulsive Shock Therapy: No Prior Suicide Attempts: Yes  Physical Exam: Constitutional:  There were no vitals taken for this visit.  Recent Results (from the past 2160 hour(s))  PACEMAKER DEVICE OBSERVATION     Status: None   Collection Time    06/22/13  4:22 PM      Result Value Ref Range   PACEART PDF       DEVICE MODEL PM       BATTERY VOLTAGE      [email or web address]     Status: None   Collection Time    06/23/13 10:51 AM      Result Value Ref Range   Pulse Generator Manufacturer V[employer]ron     Pulse Gen Model T60dr     Pulse Gen Serial Number [id number]     RV Sense Sensitivity 2     RA Pace Amplitude 2     RV Pace PulseWidth 0.4     RV Pace Amplitude 2     RA Impedance 600     RA Amplitude 1.5     RA Pacing Amplitude 0.5     RA Pacing PulseWidth 0.4     RV IMPEDANCE 550     RV Amplitude 8     RV Pacing Amplitude 0.625     RV Pacing PulseWidth 0.4     Battery Status Ageing     Battery Longevity <0.5 years     Brady RA Perc Paced 87     Brady RV Perc Paced 100     Eval Rhythm V escape 37     Miscellaneous Comment       Value: Pacemaker check in clinic. Normal device function. Thresholds, sensing, impedances consistent with previous measurements. Device programmed to maximize longevity. No mode switch or high ventricular rates noted. Device  programmed at appropriate safety      margins. Histogram distribution appropriate for patient activity level. Device programmed to optimize intrinsic conduction. Estimated longevity <0.5 year. Patient will follow up with [facility] in 2 months.     General Appearance: alert, oriented, no acute distress, she is fairly groomed and uses wheelchair as she has a balance issue.  She has mild tremors and shakes .    Musculoskeletal: Strength & Muscle Tone: decreased Gait & Station: unsteady Patient leans: N/A  Mental status  examination Patient is casually dressed and fairly groomed.  She uses wheelchair because of balance issue.  Her speech is fast, rambling and incoherent sometimes.  Her thought process is circumstantial.  Her attention and concentration is distracted.  She has paranoia about people but she denies any suicidal thoughts or homicidal thoughts.  She denies any auditory or visual hallucination.   Her fund of knowledge is average.  Her psychomotor activity is increased.  There were mild tremors .  Her cognition is fair.  She is alert and oriented x3 however she has difficulty remembering about her past.  She described her mood is not happy and  her affect is labile.  Her insight and judgment is fair.  Her impulse control is okay.    Established Problem, Stable/Improving (1), New problem, with additional work up planned, Review of Psycho-Social Stressors (1), Review or order clinical lab tests (1), Review and summation of old records (2), Established Problem, Worsening (2), New Problem, with no additional work-up planned (3), Review of Last Therapy Session (1), Review of Medication Regimen & Side Effects (2) and Review of New Medication or Change in Dosage (2)  Assessment: Axis I: Schizophrenia paranoid type  Axis II: Deferred  Axis III: See medical history  Axis IV: Mild to moderate, chronic illness  Axis V: 50-55   Plan:  I had a long discussion with the patient about her illness,  symptoms and need for antipsychotic medication.  Patient is refusing to take medication and she likes to be refer other places.  I do b.lieve patient is slowly decompensating and about medication she is at risk to relapse into her illness.  However at this time patient does not require criteria for involuntary commitment.  I have discussed with the son that he needs to keep a close eye on patient's behavior and his symptoms started to get worse than he need to call 911 or presentation to the emergency room.  I will also provide name and contact number of psychiatrist in the community.  I recommended to call us back if she ever decided to come back and change her mind to stop medication. Time spent 25 minutes.  More than 50% of the time spent in psychoeducation, counseling and coordination of care.  At this time we will not brought any medication and no followup appointment is scheduled.  Cassara Nida T., MD 09/10/2013

## 2013-09-12 ENCOUNTER — Other Ambulatory Visit (HOSPITAL_COMMUNITY): Payer: Self-pay | Admitting: Psychiatry

## 2013-09-13 ENCOUNTER — Other Ambulatory Visit (HOSPITAL_COMMUNITY): Payer: Self-pay | Admitting: Psychiatry

## 2013-09-13 NOTE — Telephone Encounter (Signed)
Patient decided to stop her medication without my recommendation. She need to contact us if she needs refill.

## 2013-09-15 ENCOUNTER — Encounter: Payer: Self-pay | Admitting: Cardiovascular Disease

## 2013-09-15 ENCOUNTER — Telehealth (HOSPITAL_COMMUNITY): Payer: Self-pay | Admitting: *Deleted

## 2013-09-15 NOTE — Telephone Encounter (Signed)
Sister called stating that patient had refused to take her medication last week and is now willing to take. Her son wants to know if he could give her more than prescribed because they had done that in the past. Ask caller what medication she was referring to and she did not know. States she is wheelchair bound and the medication was in the car. Explained that a note could be left for the Dr but needed to know medication. Sister called back states the medication is zyprexa. Also wants Dr. Adele Schilder to leave a message on her answering machine if she doesn't make it to the phone in time due to being in wheelchair. She also would like to talk to him about getting an appointment since she is now willing to see him.

## 2013-09-16 ENCOUNTER — Other Ambulatory Visit (HOSPITAL_COMMUNITY): Payer: Self-pay | Admitting: Psychiatry

## 2013-09-16 ENCOUNTER — Telehealth (HOSPITAL_COMMUNITY): Payer: Self-pay | Admitting: *Deleted

## 2013-09-16 DIAGNOSIS — F200 Paranoid schizophrenia: Secondary | ICD-10-CM

## 2013-09-16 MED ORDER — OLANZAPINE 5 MG PO TABS: ORAL_TABLET | ORAL | Status: DC

## 2013-09-16 MED ORDER — DIVALPROEX SODIUM ER 500 MG PO TB24: ORAL_TABLET | ORAL | Status: DC

## 2013-09-16 NOTE — Telephone Encounter (Signed)
Son left [id number] states now she is willing to take her medicine. Son says she needs refill of Depakote - enough to last until he can get her an appointment with a new provider, since she has been discharged by Dr. Adele Schilder.

## 2013-09-17 ENCOUNTER — Telehealth (HOSPITAL_COMMUNITY): Payer: Self-pay

## 2013-10-05 ENCOUNTER — Ambulatory Visit (INDEPENDENT_AMBULATORY_CARE_PROVIDER_SITE_OTHER): Payer: Medicare Other | Admitting: Family Medicine

## 2013-10-05 ENCOUNTER — Encounter: Payer: Self-pay | Admitting: Family Medicine

## 2013-10-05 VITALS — BP 145/80 | HR 70 | Temp 97.7°F | Resp 20

## 2013-10-05 DIAGNOSIS — N3000 Acute cystitis without hematuria: Secondary | ICD-10-CM

## 2013-10-05 DIAGNOSIS — R300 Dysuria: Secondary | ICD-10-CM

## 2013-10-05 LAB — POCT URINALYSIS DIPSTICK
Bilirubin, UA: NEGATIVE
Glucose, UA: NEGATIVE
Ketones, UA: NEGATIVE
Leukocytes, UA: NEGATIVE
Nitrite, UA: NEGATIVE
Protein, UA: 100
SPEC GRAV UA: 1.025
UROBILINOGEN UA: 0.2
pH, UA: 6

## 2013-10-05 LAB — POCT UA - MICROSCOPIC ONLY

## 2013-10-05 MED ORDER — PHENAZOPYRIDINE HCL 200 MG PO TABS: 200.0000 mg | ORAL_TABLET | Freq: Three times a day (TID) | ORAL | Status: DC | PRN

## 2013-10-05 NOTE — Progress Notes (Signed)
  Subjective:    Abigail Wallace is a 68 y.o. female who complains of dysuria, suprapubic pressure and urgency. She has had symptoms for 4 days. Patient also complains of back pain. Patient denies fever, vaginal discharge and Chills. Patient does not have a history of recurrent UTI. Patient does not have a history of pyelonephritis.   The following portions of the patient's history were reviewed and updated as appropriate: allergies, current medications, past family history, past medical history, past social history, past surgical history and problem list.  Review of Systems Pertinent items are noted in HPI.    Objective:    BP 145/80  Pulse 70  Temp(Src) 97.7 F (36.5 C) (Oral)  Resp 20  SpO2 98% General appearance: alert, cooperative, appears stated age and no distress Lungs: clear to auscultation bilaterally Heart: regular rate and rhythm Abdomen: soft, non-tender; bowel sounds normal; no masses,  no organomegaly and Slight CVA TTP R>L  Laboratory:  Urine dipstick: trace for hemoglobin and 1+ for protein.   Micro exam: dirty urine.    Assessment:    Genital irritation     Plan:    Medications: not indicated at this time. Maintain adequate hydration. Follow up if symptoms not improving, and as needed.  Will tx with pyridium x 2 days May need referral to urology if continues for cystoscopy

## 2013-10-05 NOTE — Patient Instructions (Signed)
Phenazopyridine tablets What is this medicine? PHENAZOPYRIDINE (fen az oh PEER i deen) is a pain reliever. It is used to stop the pain, burning, or discomfort caused by infection or irritation of the urinary tract. This medicine is not an antibiotic. It will not cure a urinary tract infection. This medicine may be used for other purposes; ask your health care provider or pharmacist if you have questions. COMMON BRAND NAME(S): AZO, Azo-100, Azo-Gesic, Azo-Septic, Azo-Standard, Phenazo, Prodium, Pyridium, Urinary Analgesic, Uristat What should I tell my health care provider before I take this medicine? They need to know if you have any of these conditions: -glucose-6-phosphate dehydrogenase (G6PD) deficiency -kidney disease -an unusual or allergic reaction to phenazopyridine, other medicines, foods, dyes, or preservatives -pregnant or trying to get pregnant -breast-feeding How should I use this medicine? Take this medicine by mouth with a glass of water. Follow the directions on the prescription label. Take after meals. Take your doses at regular intervals. Do not take your medicine more often than directed. Do not skip doses or stop your medicine early even if you feel better. Do not stop taking except on your doctor's advice. Talk to your pediatrician regarding the use of this medicine in children. Special care may be needed. Overdosage: If you think you have taken too much of this medicine contact a poison control center or emergency room at once. NOTE: This medicine is only for you. Do not share this medicine with others. What if I miss a dose? If you miss a dose, take it as soon as you can. If it is almost time for your next dose, take only that dose. Do not take double or extra doses. What may interact with this medicine? Interactions are not expected. This list may not describe all possible interactions. Give your health care provider a list of all the medicines, herbs, non-prescription  drugs, or dietary supplements you use. Also tell them if you smoke, drink alcohol, or use illegal drugs. Some items may interact with your medicine. What should I watch for while using this medicine? Tell your doctor or health care professional if your symptoms do not improve or if they get worse. This medicine colors body fluids red. This effect is harmless and will go away after you are done taking the medicine. It will change urine to an dark orange or red color. The red color may stain clothing. Soft contact lenses may become permanently stained. It is best not to wear soft contact lenses while taking this medicine. If you are diabetic you may get a false positive result for sugar in your urine. Talk to your health care provider. What side effects may I notice from receiving this medicine? Side effects that you should report to your doctor or health care professional as soon as possible: -allergic reactions like skin rash, itching or hives, swelling of the face, lips, or tongue -blue or purple color of the skin -difficulty breathing -fever -less urine -unusual bleeding, bruising -unusual tired, weak -vomiting -yellowing of the eyes or skin Side effects that usually do not require medical attention (report to your doctor or health care professional if they continue or are bothersome): -dark urine -headache -stomach upset This list may not describe all possible side effects. Call your doctor for medical advice about side effects. You may report side effects to FDA at 1-800-FDA-1088. Where should I keep my medicine? Keep out of the reach of children. Store at room temperature between 15 and 30 degrees C (59 and 86  degrees F). Protect from light and moisture. Throw away any unused medicine after the expiration date. NOTE: This sheet is a summary. It may not cover all possible information. If you have questions about this medicine, talk to your doctor, pharmacist, or health care provider.   2015, Elsevier/Gold Standard. (2007-07-31 11:04:07)

## 2013-10-05 NOTE — Addendum Note (Signed)
Addended by: Martin[place], Maleeha Halls on: 10/05/2013 05:15 PM   Modules accepted: Orders

## 2013-10-15 ENCOUNTER — Encounter: Payer: Self-pay | Admitting: Cardiovascular Disease

## 2013-10-15 ENCOUNTER — Ambulatory Visit (INDEPENDENT_AMBULATORY_CARE_PROVIDER_SITE_OTHER): Payer: Medicare Other | Admitting: Cardiovascular Disease

## 2013-10-15 VITALS — BP 126/86 | HR 71 | Resp 16 | Ht 66.0 in

## 2013-10-15 DIAGNOSIS — I495 Sick sinus syndrome: Secondary | ICD-10-CM

## 2013-10-15 DIAGNOSIS — I442 Atrioventricular block, complete: Secondary | ICD-10-CM

## 2013-10-15 DIAGNOSIS — I2510 Atherosclerotic heart disease of native coronary artery without angina pectoris: Secondary | ICD-10-CM

## 2013-10-15 DIAGNOSIS — I10 Essential (primary) hypertension: Secondary | ICD-10-CM

## 2013-10-15 NOTE — Patient Instructions (Addendum)
Your physician recommends that you schedule a follow-up appointment in: 2 months with Dr.Croitoru + pacemaker check

## 2013-10-15 NOTE — Progress Notes (Signed)
Patient ID: Abigail Wallace, female   DOB: [birth year], 68 y.o.   MRN: [id number]     Reason for office visit [facility], pacemaker check  Abigail Wallace as a dual chamber permanent pacemaker is approaching elective replacement indicator, but battery voltage has really not age further since her last appointment 3 months ago. Estimated generator longevity is less than 6 months.   She has a history of schizophrenia with psychosis requiring hospitalization in 2012. Since then she has done well and lives independently. Her son, [name] ,accompanies her to the visit today and lives close by.  She received a dual-chamber permanent pacemaker in 2007 for symptomatic AV block. Her device is a Vitatron T. 46 DR. Serial number [id number]. She has 100% ventricular pacing but does have an underlying ventricular rhythm at about 37 beats per minute. She also paces the atrium 87% of the time. The lead parameters are excellent. She has minimal coronary atherosclerosis by cardiac catheterization performed in 2007 and no evidence of insufficiency by nuclear perfusion testing in 2012. By echocardiography she has normal left ventricular size and systolic function and no major structural cardiac abnormalities.  Noncardiac history includes chronic back pain, obesity and orthostatic hypotension. She smokes roughly 5 cigarettes a day and has been diagnosed with COPD, but I don't think this has been formally documented.  Allergies  Allergen Reactions  . Aripiprazole Anaphylaxis    Tremors  . Benadryl [Diphenhydramine Hcl] Other (See Comments)    States affects her vision  . Remeron [Mirtazapine] Other (See Comments)    Tremors and shakes  . Codeine Other (See Comments)    Unknown   . Cogentin [Benztropine] Other (See Comments)    Affects mobility  . Haloperidol Lactate     Vision changes  . Latuda [Lurasidone Hcl]     "Tremors and shakes."   . Latex Rash    Current Outpatient Prescriptions  Medication Sig Dispense Refill   . allopurinol (ZYLOPRIM) 100 MG tablet Take 1 tablet (100 mg total) by mouth daily.  90 tablet  3  . divalproex (DEPAKOTE ER) 500 MG 24 hr tablet take 1 tablet by mouth daily  30 tablet  0  . hydrochlorothiazide (MICROZIDE) 12.5 MG capsule Take 1 capsule (12.5 mg total) by mouth daily.  30 capsule  6  . losartan (COZAAR) 100 MG tablet Take 0.5 tablets (50 mg total) by mouth daily.  90 tablet  1  . metoprolol (LOPRESSOR) 50 MG tablet Take 1 tablet (50 mg total) by mouth 2 (two) times daily.  60 tablet  6  . Nitroglycerin (NITROSTAT SL) Place 1 tablet under the tongue every 5 (five) minutes x 3 doses as needed. For chest      . OLANZapine (ZYPREXA) 5 MG tablet take 1/2 tablet by mouth at bedtime  30 tablet  0  . phenazopyridine (PYRIDIUM) 200 MG tablet Take 1 tablet (200 mg total) by mouth 3 (three) times daily as needed for pain.  6 tablet  0  . rosuvastatin (CRESTOR) 5 MG tablet Take 1 tablet (5 mg total) by mouth every evening.  30 tablet  11   No current facility-administered medications for this visit.    Past Medical History  Diagnosis Date  . Cystitis     Frequent episodes.   . Gout   . Coronary artery disease   . COPD (chronic obstructive pulmonary disease)   . GERD (gastroesophageal reflux disease)   . Mental disorder   . Psychosis  Past Surgical History  Procedure Laterality Date  . Pacemaker placement  09/28/2005    2/2 SSS?  Marland Kitchen Cholecystectomy    . Tubal ligation    . Cardiac catheterization  2007    Non-critical.   . Persantine stress test  05/01/2010    EF 66%. Normal LV sys fx. Unchanged from previous studies.   . Insert / replace / remove pacemaker  2007    [place]    Family History  Problem Relation Age of Onset  . Diabetes type II Son   . Heart disease Mother   . Kidney disease Mother   . Heart disease Father   . Kidney disease Father     History   Social History  . Marital Status: Divorced    Spouse Name: N/A    Number of Children: N/A  .  Years of Education: N/A   Occupational History  . Not on file.   Social History Main Topics  . Smoking status: Former Smoker -- 0.50 packs/day    Types: Cigarettes  . Smokeless tobacco: Not on file     Comment: electronic cigarettes  . Alcohol Use: No  . Drug Use: No  . Sexual Activity: Not on file   Other Topics Concern  . Not on file   Social History Narrative   Lives alone. Older sister lives nearby and helps take her to appointments and to take care of her; sister uses wheelchair, unsure why (thin and appears healthy).    Married.   Children: 2 daughters, 1 son; 60 GC, 5 GGC.    Occupation: disabled.    Caffeine:   Tobacco: 5-6/day   Denies alcohol.     Review of systems: The patient specifically denies any chest pain at rest or with exertion, dyspnea at rest, orthopnea, paroxysmal nocturnal dyspnea, syncope, palpitations, focal neurological deficits, intermittent claudication, lower extremity edema at this time, unexplained weight gain, cough, hemoptysis or wheezing.  The patient also denies abdominal pain, nausea, vomiting, dysphagia, diarrhea, constipation, polyuria, polydipsia, dysuria, hematuria, frequency, urgency, abnormal bleeding or bruising, fever, chills, unexpected weight changes, mood swings, change in skin or hair texture, change in voice quality, auditory or visual problems, allergic reactions or rashes, new musculoskeletal complaints other than usual "aches and pains".      PHYSICAL EXAM BP 126/86  Pulse 71  Ht 5\' 6"  (1.676 m) General: Alert, oriented x3, no distress  Head: no evidence of trauma, PERRL, EOMI, no exophtalmos or lid lag, no myxedema, no xanthelasma; normal ears, nose and oropharynx  Neck: normal jugular venous pulsations and no hepatojugular reflux; brisk carotid pulses without delay and no carotid bruits  Chest: clear to auscultation, no signs of consolidation by percussion or palpation, normal fremitus, symmetrical and full respiratory  excursions. Pacemaker scar is unusually low in the left subclavian area and the device has migrated into the upper part of her breast. However the site appears very healthy without evidence of swelling or infection.  Cardiovascular: normal position and quality of the apical impulse, regular rhythm, normal first and paradoxically split second heart sounds, no murmurs, rubs or gallops  Abdomen: no tenderness or distention, no masses by palpation, no abnormal pulsatility or arterial bruits, normal bowel sounds, no hepatosplenomegaly  Extremities: no clubbing, cyanosis or edema; 2+ radial, ulnar and brachial pulses bilaterally; 2+ right femoral, posterior tibial and dorsalis pedis pulses; 2+ left femoral, posterior tibial and dorsalis pedis pulses; no subclavian or femoral bruits  Neurological: grossly nonfocal   Lipid Panel  Component Value Date/Time   CHOL 122 11/19/2010 0605   TRIG 73 11/19/2010 0605   HDL 45 11/19/2010 0605   CHOLHDL 2.7 11/19/2010 0605   VLDL 15 11/19/2010 0605   LDLCALC 62 11/19/2010 0605   LDLDIRECT 80 03/20/2012 1510    BMET    Component Value Date/Time   NA 137 04/22/2013 1625   K 4.2 04/22/2013 1625   CL 99 04/22/2013 1625   CO2 22 04/22/2013 1625   GLUCOSE 88 04/22/2013 1625   BUN 16 04/22/2013 1625   CREATININE 0.89 04/22/2013 1625   CREATININE 0.72 11/23/2010 1215   CALCIUM 9.5 04/22/2013 1625   GFRNONAA 87* 11/23/2010 1215   GFRAA >90 11/23/2010 1215     ASSESSMENT AND PLAN  CHB (complete heart block)  Although there is a slow idioventricular escape rhythm, she should be considered pacemaker dependent  Pacemaker  Her device is approaching elective replacement indicator, anticipated before the end of the year. We discussed the procedure and its possible complications. Will start checking her pacemaker battery voltage every 2 months.  HTN (hypertension)  Her blood pressure is better after increasing the metoprolol.  HYPERCHOLESTEROLEMIA  Her last lipid profile shows very  satisfactory parameters on Crestor     Abigail Wallace  Sanda Klein, MD, [facility] [phone] office [phone] pager

## 2013-10-20 LAB — MDC_IDC_ENUM_SESS_TYPE_INCLINIC
Implantable Pulse Generator Serial Number: 2706033182
Lead Channel Pacing Threshold Amplitude: 0.5 V
Lead Channel Pacing Threshold Amplitude: 0.75 V
Lead Channel Pacing Threshold Pulse Width: 0.4 ms
Lead Channel Pacing Threshold Pulse Width: 0.4 ms
Lead Channel Sensing Intrinsic Amplitude: 1.5 mV
Lead Channel Sensing Intrinsic Amplitude: 8 mV
Lead Channel Setting Pacing Amplitude: 2 V
Lead Channel Setting Pacing Pulse Width: 0.4 ms
MDC IDC SET LEADCHNL RA PACING AMPLITUDE: 2 V
MDC IDC SET LEADCHNL RV SENSING SENSITIVITY: 2 mV

## 2013-10-22 ENCOUNTER — Encounter: Payer: Self-pay | Admitting: Cardiovascular Disease

## 2013-10-26 ENCOUNTER — Telehealth: Payer: Self-pay | Admitting: Cardiovascular Disease

## 2013-10-26 NOTE — Telephone Encounter (Signed)
Calling because pharmacy is stating that her Metropolol Tartrate is showing that her medication is only for a 30 day quantity and she takes 2 tablets per day . Stating that she should have 60 quantity .Marland Kitchen Please call .Marland Kitchen

## 2013-10-26 NOTE — Telephone Encounter (Signed)
Call to pharmacy - medication on file - they will fill metoprolol tartrate.

## 2013-11-18 ENCOUNTER — Telehealth: Payer: Self-pay | Admitting: *Deleted

## 2013-11-18 NOTE — Telephone Encounter (Signed)
Pts son called.  States that his mom was seen by psych yesterday and they are thinking that she has another UTI due to her c/o urinary burning and her agitation.  Son states that they rely on public transportation and mom is in a wheelchair so it is very hard for them to make an appt.  Declined appt to have urine checked at this time.  Wants to know if MD can call in something for her, given her history of UTI. Malique Driskill, [name]

## 2013-11-19 MED ORDER — CEPHALEXIN 500 MG PO CAPS: 500.0000 mg | ORAL_CAPSULE | Freq: Four times a day (QID) | ORAL | Status: DC

## 2013-11-19 MED ORDER — CEPHALEXIN 500 MG PO CAPS: 500.0000 mg | ORAL_CAPSULE | Freq: Two times a day (BID) | ORAL | Status: DC

## 2013-11-19 MED ORDER — CEPHALEXIN 500 MG PO CAPS
500.0000 mg | ORAL_CAPSULE | Freq: Four times a day (QID) | ORAL | Status: DC
Start: 2013-11-19 — End: 2013-11-19

## 2013-11-19 MED ORDER — CEPHALEXIN 500 MG PO CAPS
500.0000 mg | ORAL_CAPSULE | Freq: Four times a day (QID) | ORAL | Status: DC
Start: 2013-11-19 — End: 2013-12-02

## 2013-11-19 MED ORDER — CIPROFLOXACIN HCL 500 MG PO TABS: 500.0000 mg | ORAL_TABLET | Freq: Two times a day (BID) | ORAL | Status: DC

## 2013-11-19 NOTE — Telephone Encounter (Signed)
Talk to patient's caregiver, her son. Was able to discuss patient's current symptoms. Patient was recently seen by her psychiatrist who recommended seeking treatment for suspected UTI. I was uncomfortable providing this prescription at first. However with the description of her current symptoms as well as the knowledge that she was seen by a health care provider recently who recommended treatment I feel more comfortable providing this prescription over the phone. Prescription for Keflex 4 times a day for 7 days was provided to cover suspected UTI. Previous cultures earlier this year showed good susceptibility to this medication. My only concern is that she has been treated with Keflex in the past. Cipro was a poor option due to the fact the patient is currently on antipsychotic with a borderline elevated QTC. I will consider alternative medication of Bactrim if this current infection is resistant to the current treatment plan.  I have asked son to make sure that she schedules a follow-up appointment with me in approximately 10-14 days. I would like to see her after completion of her antibiotic regimen. Son stated his understanding and willingness to do so.

## 2013-11-25 ENCOUNTER — Ambulatory Visit: Payer: Medicare Other | Admitting: Neurology

## 2013-12-02 ENCOUNTER — Encounter: Payer: Self-pay | Admitting: Neurology

## 2013-12-02 ENCOUNTER — Ambulatory Visit (INDEPENDENT_AMBULATORY_CARE_PROVIDER_SITE_OTHER): Payer: Medicare Other | Admitting: Neurology

## 2013-12-02 VITALS — BP 159/82 | HR 70 | Ht 66.0 in | Wt 247.0 lb

## 2013-12-02 DIAGNOSIS — E538 Deficiency of other specified B group vitamins: Secondary | ICD-10-CM

## 2013-12-02 DIAGNOSIS — R5381 Other malaise: Secondary | ICD-10-CM

## 2013-12-02 DIAGNOSIS — G2401 Drug induced subacute dyskinesia: Secondary | ICD-10-CM

## 2013-12-02 DIAGNOSIS — R413 Other amnesia: Secondary | ICD-10-CM

## 2013-12-02 DIAGNOSIS — I2510 Atherosclerotic heart disease of native coronary artery without angina pectoris: Secondary | ICD-10-CM

## 2013-12-02 DIAGNOSIS — R5383 Other fatigue: Secondary | ICD-10-CM

## 2013-12-02 HISTORY — DX: Other amnesia: R41.3

## 2013-12-02 HISTORY — DX: Drug induced subacute dyskinesia: G24.01

## 2013-12-02 NOTE — Progress Notes (Signed)
Reason for visit: memory disturbance  Abigail Wallace is a 68 y.o. female  History of present illness:  [name] is a 68 year old right-handed white female with a history of depression and anxiety. The patient is currently treated with Zyprexa and Depakote. The patient has had a 7 or 8 year history of some memory issues, but the son indicates that the memory difficulties have become more prominent over the last 2 years. The patient lives alone, but she does require some assistance with medications and with cooking. The patient has severe degenerative arthritis in her right hip and she has difficulty walking, and she uses a cane for short distance walking around the house. The patient will misplace things about the house, but she indicates that she is able to keep up with her finances. The patient will repeat herself on occasion, and she needs some help with appointments. The patient does not operate a motor vehicle, but she has not driven in 25 years. The patient appears to have episodes of slight confusion and paranoia. She has difficulty with chronic insomnia. She reports some numbness around the mouth, otherwise no numbness on the body. She has a sensation of being "weak all over". The patient is sent to this office for further evaluation of her memory issues.  Past Medical History  Diagnosis Date  . Cystitis     Frequent episodes.   . Gout   . Coronary artery disease   . COPD (chronic obstructive pulmonary disease)   . GERD (gastroesophageal reflux disease)   . Mental disorder   . Psychosis   . Memory difficulties 12/02/2013  . Tardive dyskinesia 12/02/2013    Past Surgical History  Procedure Laterality Date  . Pacemaker placement  09/28/2005    2/2 SSS?  Marland Kitchen Cholecystectomy    . Tubal ligation    . Cardiac catheterization  2007    Non-critical.   . Persantine stress test  05/01/2010    EF 66%. Normal LV sys fx. Unchanged from previous studies.   . Insert / replace /  remove pacemaker  2007    [place]    Family History  Problem Relation Age of Onset  . Diabetes type II Son   . Heart disease Mother   . Kidney disease Mother   . Heart disease Father   . Kidney disease Father   . Stroke Sister     Social history:  reports that she has been smoking Cigarettes.  She has been smoking about 0.50 packs per day. She has never used smokeless tobacco. She reports that she does not drink alcohol or use illicit drugs.  Medications:  Current Outpatient Prescriptions on File Prior to Visit  Medication Sig Dispense Refill  . allopurinol (ZYLOPRIM) 100 MG tablet Take 1 tablet (100 mg total) by mouth daily. 90 tablet 3  . divalproex (DEPAKOTE ER) 500 MG 24 hr tablet take 1 tablet by mouth daily 30 tablet 0  . hydrochlorothiazide (MICROZIDE) 12.5 MG capsule Take 1 capsule (12.5 mg total) by mouth daily. 30 capsule 6  . losartan (COZAAR) 100 MG tablet Take 0.5 tablets (50 mg total) by mouth daily. 90 tablet 1  . metoprolol (LOPRESSOR) 50 MG tablet Take 1 tablet (50 mg total) by mouth 2 (two) times daily. 60 tablet 6  . Nitroglycerin (NITROSTAT SL) Place 1 tablet under the tongue every 5 (five) minutes x 3 doses as needed. For chest    . OLANZapine (ZYPREXA) 5 MG tablet take 1/2  tablet by mouth at bedtime 30 tablet 0  . rosuvastatin (CRESTOR) 5 MG tablet Take 1 tablet (5 mg total) by mouth every evening. 30 tablet 11   No current facility-administered medications on file prior to visit.      Allergies  Allergen Reactions  . Abilify [Aripiprazole] Shortness Of Breath    Breathing problems  . Aripiprazole Anaphylaxis    Tremors  . Benadryl [Diphenhydramine Hcl] Other (See Comments)    States affects her vision  . Remeron [Mirtazapine] Other (See Comments)    Tremors and shakes  . Codeine Other (See Comments)    Unknown   . Cogentin [Benztropine] Other (See Comments)    Affects mobility  . Haloperidol Lactate     Vision changes  . Latuda [Lurasidone  Hcl]     "Tremors and shakes."   . Latex Rash    ROS:  Out of a complete 14 system review of symptoms, the patient complains only of the following symptoms, and all other reviewed systems are negative.  Fevers, chills, weight gain, fatigue Chest pain, swelling in the legs Skin rash Blurred vision Feeling hot, cold Urination problems Numbness, weakness, dizziness, tremor Insomnia  Blood pressure 159/82, pulse 70, height 5\' 6"  (1.676 m), weight 247 lb (112.038 kg).  Physical Exam  General: The patient is alert and cooperative at the time of the examination. The patient is markedly obese.  Eyes: Pupils are equal, round, and reactive to light. Discs are flat bilaterally.  Neck: The neck is supple, no carotid bruits are noted.  Respiratory: The respiratory examination is clear.  Cardiovascular: The cardiovascular examination reveals a regular rate and rhythm, no obvious murmurs or rubs are noted.  Skin: Extremities are with 1+ edema below the knee on the right, 2+ edema on the left.  Neurologic Exam  Mental status: The Mini-Mental status examination done today shows a total score 26/30.  Cranial nerves: Facial symmetry is present. There is good sensation of the face to pinprick and soft touch bilaterally. The strength of the facial muscles and the muscles to head turning and shoulder shrug are normal bilaterally. Speech is well enunciated, no aphasia or dysarthria is noted. Extraocular movements are full. Visual fields are full. The tongue is midline, and the patient has symmetric elevation of the soft palate. No obvious hearing deficits are noted. Tardive movements are seen in the mouth and tongue.  Motor: The motor testing reveals 5 over 5 strength of all 4 extremities. Good symmetric motor tone is noted throughout.  Sensory: Sensory testing is intact to pinprick, soft touch, vibration sensation, and position sense on all 4 extremities. No evidence of extinction is  noted.  Coordination: Cerebellar testing reveals good finger-nose-finger and heel-to-shin bilaterally. The patient has an intention tremor with finger-nose-finger bilaterally.  Gait and station: The patient requires assistance for standing, she refused to walk today, afraid she will fall.  Reflexes: Deep tendon reflexes are symmetric, but are depressed bilaterally. Toes are downgoing bilaterally.   CT head 11/19/10:  IMPRESSION: Normal study for patient's age.   Assessment/Plan:  1. Memory disturbance  2. Depression and anxiety  3. Gait disorder  4. Degenerative arthritis, right hip  The patient has had some issues with memory that have been present for number of years. There are concerns his memory is worsening over the last 2 years. The patient will be set up for blood work today, and she will have a CT scan of the brain. She has a pacemaker in place, and  she cannot have MRI evaluation. The patient will follow-up in 4 months. We will consider addition of medication such as Aricept in the future.  Jill Alexanders MD 12/02/2013 5:45 PM  [facility] [address] [place], [place] [address]  Phone [phone] Fax [phone]

## 2013-12-02 NOTE — Patient Instructions (Signed)

## 2013-12-03 ENCOUNTER — Telehealth: Payer: Self-pay | Admitting: Neurology

## 2013-12-03 LAB — TSH: TSH: 2.87 u[IU]/mL (ref 0.450–4.500)

## 2013-12-03 LAB — VITAMIN B12: VITAMIN B 12: 562 pg/mL (ref 211–946)

## 2013-12-03 LAB — AMMONIA: Ammonia: 98 ug/dL — ABNORMAL HIGH (ref 19–87)

## 2013-12-03 LAB — RPR: RPR: NONREACTIVE

## 2013-12-03 LAB — HIV ANTIBODY (ROUTINE TESTING W REFLEX)
HIV 1/O/2 Abs-Index Value: <1 (ref <1.00)
HIV-1/HIV-2 Ab: NONREACTIVE

## 2013-12-03 NOTE — Telephone Encounter (Signed)
I called the patient, talk with the family. The blood work was unremarkable with the exception that the ammonia level was slightly elevated. I will not take her off of the Depakote at this time, but the ammonia level does need to be followed. If it remains elevated or is still climbing, the medication may need to be discontinued.

## 2013-12-04 ENCOUNTER — Ambulatory Visit: Payer: Self-pay | Admitting: Family Medicine

## 2013-12-08 ENCOUNTER — Encounter: Payer: Self-pay | Admitting: Neurology

## 2013-12-08 ENCOUNTER — Telehealth: Payer: Self-pay | Admitting: Family Medicine

## 2013-12-08 DIAGNOSIS — M1711 Unilateral primary osteoarthritis, right knee: Secondary | ICD-10-CM

## 2013-12-08 NOTE — Telephone Encounter (Signed)
Son called for his mother. She needs a new 4 wheel walker called in to [facility]. Her's is broken and now can not get around without this. jw

## 2013-12-08 NOTE — Telephone Encounter (Signed)
Rx faxed to [facility] 

## 2013-12-08 NOTE — Telephone Encounter (Signed)
Prescription for 4-wheeled walker written, and given to staff.  Thank you!

## 2013-12-29 ENCOUNTER — Ambulatory Visit (INDEPENDENT_AMBULATORY_CARE_PROVIDER_SITE_OTHER): Payer: Medicare Other | Admitting: Cardiovascular Disease

## 2013-12-29 ENCOUNTER — Encounter: Payer: Self-pay | Admitting: Cardiovascular Disease

## 2013-12-29 VITALS — BP 150/90 | HR 70 | Resp 16 | Ht 66.0 in | Wt 240.0 lb

## 2013-12-29 DIAGNOSIS — I2510 Atherosclerotic heart disease of native coronary artery without angina pectoris: Secondary | ICD-10-CM

## 2013-12-29 DIAGNOSIS — I495 Sick sinus syndrome: Secondary | ICD-10-CM

## 2013-12-29 DIAGNOSIS — Z950 Presence of cardiac pacemaker: Secondary | ICD-10-CM

## 2013-12-29 DIAGNOSIS — I442 Atrioventricular block, complete: Secondary | ICD-10-CM

## 2013-12-29 LAB — MDC_IDC_ENUM_SESS_TYPE_INCLINIC
Battery Remaining Longevity: 6 mo
Brady Statistic RA Percent Paced: 82 %
Implantable Pulse Generator Serial Number: 2706033182
Lead Channel Impedance Value: 550 Ohm
Lead Channel Pacing Threshold Amplitude: 0.625 V
Lead Channel Pacing Threshold Amplitude: 0.625 V
Lead Channel Pacing Threshold Pulse Width: 0.4 ms
Lead Channel Sensing Intrinsic Amplitude: 1.5 mV
Lead Channel Sensing Intrinsic Amplitude: 8 mV
Lead Channel Setting Pacing Pulse Width: 0.4 ms
MDC IDC MSMT LEADCHNL RA IMPEDANCE VALUE: 550 Ohm
MDC IDC MSMT LEADCHNL RV PACING THRESHOLD PULSEWIDTH: 0.4 ms
MDC IDC SET LEADCHNL RA PACING AMPLITUDE: 2 V
MDC IDC SET LEADCHNL RV PACING AMPLITUDE: 2 V
MDC IDC SET LEADCHNL RV SENSING SENSITIVITY: 2 mV
MDC IDC STAT BRADY RV PERCENT PACED: 99 %

## 2013-12-29 NOTE — Patient Instructions (Signed)
Your physician recommends that you schedule a follow-up appointment in: 3 Months with Pacer Check

## 2013-12-30 ENCOUNTER — Encounter: Payer: Self-pay | Admitting: Cardiovascular Disease

## 2013-12-30 NOTE — Progress Notes (Signed)
Patient ID: Abigail Wallace, female   DOB: [birth year], 68 y.o.   MRN: [id number]      Reason for office visit [facility], pacemaker check  Abigail Wallace is pacemaker dependent due to complete heart block. She has a dual chamber permanent pacemaker is approaching elective replacement indicator, but battery voltage has really not aged further since her last appointment 3 months ago and the one before that, 6 months ago. Estimated generator longevity is less than 6 months.   She has a history of schizophrenia with psychosis requiring hospitalization in 2012. Since then she has done well and lives independently. Her son, Abigail Wallace ,accompanies her to the visit today and lives close by.  She received a dual-chamber permanent pacemaker in 2007 for symptomatic AV block. Her device is a Vitatron T. 69 DR. Serial number [id number]. She has 100% ventricular pacing but does have an underlying ventricular rhythm at about 37 beats per minute. She also paces the atrium 87% of the time. The lead parameters are excellent. She has minimal coronary atherosclerosis by cardiac catheterization performed in 2007 and no evidence of insufficiency by nuclear perfusion testing in 2012. By echocardiography she has normal left ventricular size and systolic function and no major structural cardiac abnormalities.  Noncardiac history includes chronic back pain, obesity and orthostatic hypotension. She smokes roughly 5 cigarettes a day and has been diagnosed with COPD, but I don't think this has been formally documented.   Allergies  Allergen Reactions  . Abilify [Aripiprazole] Shortness Of Breath    Breathing problems  . Aripiprazole Anaphylaxis    Tremors  . Benadryl [Diphenhydramine Hcl] Other (See Comments)    States affects her vision  . Remeron [Mirtazapine] Other (See Comments)    Tremors and shakes  . Codeine Other (See Comments)    Unknown   . Cogentin [Benztropine] Other (See Comments)    Affects mobility  .  Haloperidol Lactate     Vision changes  . Latuda [Lurasidone Hcl]     "Tremors and shakes."   . Latex Rash    Current Outpatient Prescriptions  Medication Sig Dispense Refill  . acetaminophen (TYLENOL) 500 MG tablet Take 500 mg by mouth 2 (two) times daily. Three tablets 2 times daily.    Marland Kitchen allopurinol (ZYLOPRIM) 100 MG tablet Take 1 tablet (100 mg total) by mouth daily. 90 tablet 3  . divalproex (DEPAKOTE ER) 500 MG 24 hr tablet take 1 tablet by mouth daily 30 tablet 0  . hydrochlorothiazide (MICROZIDE) 12.5 MG capsule Take 1 capsule (12.5 mg total) by mouth daily. 30 capsule 6  . losartan (COZAAR) 100 MG tablet Take 0.5 tablets (50 mg total) by mouth daily. 90 tablet 1  . metoprolol (LOPRESSOR) 50 MG tablet Take 1 tablet (50 mg total) by mouth 2 (two) times daily. 60 tablet 6  . Nitroglycerin (NITROSTAT SL) Place 1 tablet under the tongue every 5 (five) minutes x 3 doses as needed. For chest    . OLANZapine (ZYPREXA) 5 MG tablet take 1/2 tablet by mouth at bedtime 30 tablet 0  . rosuvastatin (CRESTOR) 5 MG tablet Take 1 tablet (5 mg total) by mouth every evening. 30 tablet 11   No current facility-administered medications for this visit.    Past Medical History  Diagnosis Date  . Cystitis     Frequent episodes.   . Gout   . Coronary artery disease   . COPD (chronic obstructive pulmonary disease)   . GERD (gastroesophageal reflux disease)   .  Mental disorder   . Psychosis   . Memory difficulties 12/02/2013  . Tardive dyskinesia 12/02/2013    Past Surgical History  Procedure Laterality Date  . Pacemaker placement  09/28/2005    2/2 SSS?  Marland Kitchen Cholecystectomy    . Tubal ligation    . Cardiac catheterization  2007    Non-critical.   . Persantine stress test  05/01/2010    EF 66%. Normal LV sys fx. Unchanged from previous studies.   . Insert / replace / remove pacemaker  2007    [place]    Family History  Problem Relation Age of Onset  . Diabetes type II Son   .  Heart disease Mother   . Kidney disease Mother   . Heart disease Father   . Kidney disease Father   . Stroke Sister     History   Social History  . Marital Status: Divorced    Spouse Name: N/A    Number of Children: N/A  . Years of Education: N/A   Occupational History  . Not on file.   Social History Main Topics  . Smoking status: Current Every Day Smoker -- 0.50 packs/day    Types: Cigarettes  . Smokeless tobacco: Never Used     Comment: electronic cigarettes  . Alcohol Use: No  . Drug Use: No  . Sexual Activity: Not on file   Other Topics Concern  . Not on file   Social History Narrative   Lives alone. Older sister lives nearby and helps take her to appointments and to take care of her; sister uses wheelchair, unsure why (thin and appears healthy).    Married.   Children: 2 daughters, 1 son; 56 GC, 5 GGC.    Occupation: disabled.    Caffeine:   Tobacco: 5-6/day   Denies alcohol.     Review of systems: The patient specifically denies any chest pain at rest or with exertion, dyspnea at rest, orthopnea, paroxysmal nocturnal dyspnea, syncope, palpitations, focal neurological deficits, intermittent claudication, lower extremity edema at this time, unexplained weight gain, cough, hemoptysis or wheezing.  The patient also denies abdominal pain, nausea, vomiting, dysphagia, diarrhea, constipation, polyuria, polydipsia, dysuria, hematuria, frequency, urgency, abnormal bleeding or bruising, fever, chills, unexpected weight changes, mood swings, change in skin or hair texture, change in voice quality, auditory or visual problems, allergic reactions or rashes, new musculoskeletal complaints other than usual "aches and pains".     PHYSICAL EXAM BP 150/90 mmHg  Pulse 70  Ht 5\' 6"  (1.676 m)  Wt 240 lb (108.863 kg)  BMI 38.76 kg/m2 General: Alert, oriented x3, no distress  Head: no evidence of trauma, PERRL, EOMI, no exophtalmos or lid lag, no myxedema, no xanthelasma;  normal ears, nose and oropharynx  Neck: normal jugular venous pulsations and no hepatojugular reflux; brisk carotid pulses without delay and no carotid bruits  Chest: clear to auscultation, no signs of consolidation by percussion or palpation, normal fremitus, symmetrical and full respiratory excursions. Pacemaker scar is unusually low in the left subclavian area and the device has migrated into the upper part of her breast. However the site appears very healthy without evidence of swelling or infection.  Cardiovascular: normal position and quality of the apical impulse, regular rhythm, normal first and paradoxically split second heart sounds, no murmurs, rubs or gallops  Abdomen: no tenderness or distention, no masses by palpation, no abnormal pulsatility or arterial bruits, normal bowel sounds, no hepatosplenomegaly  Extremities: no clubbing, cyanosis or edema; 2+ radial,  ulnar and brachial pulses bilaterally; 2+ right femoral, posterior tibial and dorsalis pedis pulses; 2+ left femoral, posterior tibial and dorsalis pedis pulses; no subclavian or femoral bruits  Neurological: grossly nonfocal   EKG: A sensed V paced  Lipid Panel     Component Value Date/Time   CHOL 122 11/19/2010 0605   TRIG 73 11/19/2010 0605   HDL 45 11/19/2010 0605   CHOLHDL 2.7 11/19/2010 0605   VLDL 15 11/19/2010 0605   LDLCALC 62 11/19/2010 0605   LDLDIRECT 80 03/20/2012 1510    BMET    Component Value Date/Time   NA 137 04/22/2013 1625   K 4.2 04/22/2013 1625   CL 99 04/22/2013 1625   CO2 22 04/22/2013 1625   GLUCOSE 88 04/22/2013 1625   BUN 16 04/22/2013 1625   CREATININE 0.89 04/22/2013 1625   CREATININE 0.72 11/23/2010 1215   CALCIUM 9.5 04/22/2013 1625   GFRNONAA 87* 11/23/2010 1215   GFRAA >90 11/23/2010 1215     ASSESSMENT AND PLAN  She is very anxious to go ahead with the pacemaker generator change, but this is still premature. Will continue checks every 3 months.  Orders Placed This  Encounter  Procedures  . Implantable device check   Meds ordered this encounter  Medications  . DISCONTD: OLANZapine (ZYPREXA) 10 MG tablet    Sig: Take 10 mg by mouth at bedtime.    Refill:  0    Abigail Wallace  Sanda Klein, MD, [facility] [phone] office [phone] pager

## 2014-01-06 ENCOUNTER — Telehealth: Payer: Self-pay | Admitting: Family Medicine

## 2014-01-06 NOTE — Telephone Encounter (Signed)
Son called because his mother has another UTI. He would like some medication called in for her. He also would like a referral to a OBGYN for his mother so that they can see why she is always getting these. Please try to send in the medication today since he would hate for his mother to be like this over the holidays. jw

## 2014-01-11 NOTE — Telephone Encounter (Signed)
Pt informed that we cant prescribe medicine unless she is seen. We don't have any openings so i told pt to go to urgent care. Abigail Wallace C

## 2014-01-19 ENCOUNTER — Ambulatory Visit: Payer: Medicare Other | Admitting: Neurology

## 2014-01-21 ENCOUNTER — Encounter: Payer: Self-pay | Admitting: Cardiovascular Disease

## 2014-02-08 ENCOUNTER — Other Ambulatory Visit: Payer: Self-pay | Admitting: *Deleted

## 2014-02-08 DIAGNOSIS — R030 Elevated blood-pressure reading, without diagnosis of hypertension: Principal | ICD-10-CM

## 2014-02-08 DIAGNOSIS — IMO0001 Reserved for inherently not codable concepts without codable children: Secondary | ICD-10-CM

## 2014-02-09 MED ORDER — HYDROCHLOROTHIAZIDE 12.5 MG PO CAPS: 12.5000 mg | ORAL_CAPSULE | Freq: Every day | ORAL | Status: DC

## 2014-03-01 ENCOUNTER — Ambulatory Visit: Payer: Self-pay | Admitting: Family Medicine

## 2014-03-15 ENCOUNTER — Ambulatory Visit (INDEPENDENT_AMBULATORY_CARE_PROVIDER_SITE_OTHER): Payer: Medicare Other | Admitting: Family Medicine

## 2014-03-15 ENCOUNTER — Encounter: Payer: Self-pay | Admitting: Family Medicine

## 2014-03-15 VITALS — BP 141/75 | HR 71 | Temp 97.8°F | Ht 66.0 in | Wt 252.0 lb

## 2014-03-15 DIAGNOSIS — R300 Dysuria: Secondary | ICD-10-CM

## 2014-03-15 DIAGNOSIS — B3731 Acute candidiasis of vulva and vagina: Secondary | ICD-10-CM

## 2014-03-15 DIAGNOSIS — B373 Candidiasis of vulva and vagina: Secondary | ICD-10-CM

## 2014-03-15 DIAGNOSIS — N3001 Acute cystitis with hematuria: Secondary | ICD-10-CM

## 2014-03-15 LAB — POCT URINALYSIS DIPSTICK
Bilirubin, UA: NEGATIVE
GLUCOSE UA: NEGATIVE
Ketones, UA: NEGATIVE
LEUKOCYTES UA: NEGATIVE
NITRITE UA: NEGATIVE
PROTEIN UA: 100
Spec Grav, UA: 1.015
UROBILINOGEN UA: 0.2
pH, UA: 6.5

## 2014-03-15 LAB — POCT UA - MICROSCOPIC ONLY: Epithelial cells, urine per micros: >20

## 2014-03-15 MED ORDER — FLUCONAZOLE 150 MG PO TABS: 150.0000 mg | ORAL_TABLET | Freq: Once | ORAL | Status: DC

## 2014-03-15 MED ORDER — CEPHALEXIN 500 MG PO CAPS: 500.0000 mg | ORAL_CAPSULE | Freq: Three times a day (TID) | ORAL | Status: DC

## 2014-03-15 NOTE — Patient Instructions (Signed)
Thank you for coming in, today!  I'm not sure exactly what's causing your symptoms. It could be a UTI, but your urine doesn't definitely look infected. I think it's reasonable to treat you for a UTI and for yeast.  Take Keflex (cephalexin 500 mg three times per day) for 5 days for UTI. Take Diflucan (fluconazole 150 mg) one time for yeast.  Come back to see Dr. Alease Frame as needed. Please feel free to call with any questions or concerns at any time, at [phone]. --Dr. Venetia Maxon

## 2014-03-15 NOTE — Progress Notes (Signed)
   Subjective:    Patient ID: Abigail Wallace, female    DOB: [birth year], 69 y.o.   MRN: [id number]  HPI: Pt presents to clinic for [facility] for possible UTI. Pt has had about 10 days of burning and itching as well as increased frequency and urge. It is unclear whether she has had fevers but she does report some lower abdominal pain. Pt's son reports some mild confusion, which has happened with UTI's in the past. Son reports she drinks "lots of soft drinks" and "doesn't wipe" when she urinates. There is some vague report of possible blood in her stool and urine, in the past, but not currently. Son does report that her urine in the past several days has had much more of an odor than normal.  Of note, son reports pt has had several UTI's in the past. The last one was about 3 months ago; on chart review, pyridium in the past was not helpful and it is unclear whether she was actually diagnosed with UTI or not. Son does report that pt lives alone and has home aides that help with minor chores and meal prep. Pt has "lots of issues" including hip pain, chronic incontinence-type symptoms, memory issues, etc, but generally refuses interventions (e.g., ?bladder surgery by urology, f/u with ortho, etc). Pt is also a current smoker despite a diagnosis of "COPD or something like that." Son reports they have difficulty getting pt to and from doctor's office visits and transportation requires two weeks prior notice.  Review of Systems: As above.     Objective:   Physical Exam BP 141/75 mmHg  Pulse 71  Ht 5\' 6"  (1.676 m)  Wt 252 lb (114.306 kg)  BMI 40.69 kg/m2 Gen: elderly adult female with tardive diskinesia-type movements HEENT: [place], AT, EOMI, PERRLA, MMM Cardio: RRR, no murmur appreciated Pulm: CTAB, no wheezes, normal WOB Abd: soft, questionably tender in lower abdomen (difficult to examine with pt in wheelchair but declines exam out of wheelchair) GU: pt declines exam due to pain / difficulty with  chronic hip pain Ext: warm, well-perfused  UA: positive for protein and trace blood Urine micro: many squamous cells, few WBC's, few RBC's, 1+ bacteria, no yeast     Assessment & Plan:  69yo female with dysuria, acute on chronic, with UA and micro not definitively suggestive of urinary tract infection - history does suggest possible UTI (acute worsening discomfort, change in character of urine odor, etc) - also possible component of yeast infection, giving burning / itching - constellation of symptoms / chronic health issues are present (pt with memory issues, living alone, multiple chronic symptoms, etc)  Plan: - Rx for Keflex and Diflucan, empirically - defer culture given many squamous cells on microscopy - strongly recommended f/u with PCP Dr. Alease Frame to discuss chronic issues - offered connecting pt / son with clinic social worker but son / pt decline for now  Note FYI to Dr. Ainsley Spinner, MD PGY-3, [facility] 03/15/2014, 7:25 PM

## 2014-03-16 ENCOUNTER — Telehealth: Payer: Self-pay | Admitting: Family Medicine

## 2014-03-16 NOTE — Telephone Encounter (Signed)
Spoke with patient son regarding keflex rx, assured him that count is right as patient is to take 1 pill tid for 5 days. Son expressed understanding.

## 2014-03-16 NOTE — Telephone Encounter (Signed)
Keflex RX only has 15 pills. Is that right?

## 2014-03-19 ENCOUNTER — Encounter: Payer: Self-pay | Admitting: *Deleted

## 2014-03-19 ENCOUNTER — Other Ambulatory Visit: Payer: Self-pay

## 2014-03-22 ENCOUNTER — Telehealth: Payer: Self-pay | Admitting: Cardiovascular Disease

## 2014-03-22 NOTE — Telephone Encounter (Signed)
Pt called in needing to r/s her pacer check appt from 3/15 to another day. I informed her that his next pacer check day that he would have available is on 5/24, she stated that she could not wait that long. Please f/u with pt  Thanks

## 2014-03-22 NOTE — Telephone Encounter (Signed)
Regina notified to schedule on a regular day with Dr. Loletha Grayer and we will do a pacer check at that time.

## 2014-03-23 NOTE — Telephone Encounter (Signed)
Close encounter 

## 2014-03-30 ENCOUNTER — Encounter: Payer: Self-pay | Admitting: Cardiovascular Disease

## 2014-04-20 ENCOUNTER — Ambulatory Visit: Payer: Medicare Other | Admitting: Neurology

## 2014-04-22 ENCOUNTER — Ambulatory Visit: Payer: Self-pay | Admitting: Family Medicine

## 2014-05-10 ENCOUNTER — Encounter: Payer: Self-pay | Admitting: Cardiovascular Disease

## 2014-05-10 ENCOUNTER — Ambulatory Visit (INDEPENDENT_AMBULATORY_CARE_PROVIDER_SITE_OTHER): Payer: Medicare Other | Admitting: Cardiovascular Disease

## 2014-05-10 VITALS — BP 138/70 | HR 58 | Resp 20 | Ht 66.0 in | Wt 264.0 lb

## 2014-05-10 DIAGNOSIS — Z950 Presence of cardiac pacemaker: Secondary | ICD-10-CM

## 2014-05-10 DIAGNOSIS — I10 Essential (primary) hypertension: Secondary | ICD-10-CM

## 2014-05-10 DIAGNOSIS — I442 Atrioventricular block, complete: Secondary | ICD-10-CM

## 2014-05-10 DIAGNOSIS — I5032 Chronic diastolic (congestive) heart failure: Secondary | ICD-10-CM

## 2014-05-10 DIAGNOSIS — Z720 Tobacco use: Secondary | ICD-10-CM

## 2014-05-10 DIAGNOSIS — I495 Sick sinus syndrome: Secondary | ICD-10-CM

## 2014-05-10 DIAGNOSIS — I2510 Atherosclerotic heart disease of native coronary artery without angina pectoris: Secondary | ICD-10-CM | POA: Diagnosis not present

## 2014-05-10 DIAGNOSIS — G4730 Sleep apnea, unspecified: Secondary | ICD-10-CM

## 2014-05-10 DIAGNOSIS — F17200 Nicotine dependence, unspecified, uncomplicated: Secondary | ICD-10-CM | POA: Insufficient documentation

## 2014-05-10 NOTE — Patient Instructions (Signed)
Dr. Sallyanne Kuster recommends that you schedule a follow-up appointment in: One month -  [facility].

## 2014-05-10 NOTE — Progress Notes (Signed)
Patient ID: Abigail Wallace, female   DOB: December 23, 1945, 69 y.o.   MRN: [id number]     Cardiology Office Note   Date:  05/10/2014   ID:  [id number] [place], [place] Feb 01, 1945, MRN [id number]  PCP:  Elberta Leatherwood, MD  Cardiologist:   Sanda Klein, MD   Chief Complaint  Patient presents with  . Follow-up    3 months:  Occas. chest discomfort, SOB and dizzness.  Lower extremity swelling.      History of Present Illness: Abigail Wallace is a 69 y.o. female who presents for pacemaker check. She is device dependent due to complete heart block. Her dual chamber permanent pacemaker is approaching elective replacement indicator, estimated generator longevity is less than 6 months.   She has a history of schizophrenia with psychosis requiring hospitalization in 2012. Since then she has done well and lives independently. Her son, [name] ,accompanies her to the visit today and lives close by.  She received a dual-chamber permanent pacemaker in 2007 for symptomatic AV block. Her device is a Vitatron T. 74 DR. Serial number [id number]. She has 100% ventricular pacing but does have an underlying ventricular rhythm at about 37 beats per minute. She also paces the atrium 80% of the time. The lead parameters are excellent. She has minimal coronary atherosclerosis by cardiac catheterization performed in 2007 and no evidence of insufficiency by nuclear perfusion testing in 2012. By echocardiography she has normal left ventricular size and systolic function and no major structural cardiac abnormalities.  Noncardiac history includes chronic back pain, obesity and orthostatic hypotension. She smokes roughly 5 cigarettes a day and has been diagnosed with COPD, but I don't think this has been formally documented.     Past Medical History  Diagnosis Date  . Cystitis     Frequent episodes.   . Gout   . COPD (chronic obstructive pulmonary disease)   . GERD (gastroesophageal reflux disease)   . Mental disorder   .  Psychosis   . Memory difficulties 12/02/2013  . Tardive dyskinesia 12/02/2013  . Hypertension   . Coronary artery disease     NON-CRITICAL    Past Surgical History  Procedure Laterality Date  . Pacemaker placement  09/28/2005    2/2 SSS?  Marland Kitchen Cholecystectomy    . Tubal ligation    . Persantine stress test  05/01/2010    EF 66%. Normal LV sys fx. Unchanged from previous studies.   . Cardiac catheterization  2007    Non-critical.   . Insert / replace / remove pacemaker  2007    [place]/SYMPTOMATIC HEART BLOCK  . Transthoracic echocardiogram  09/08/10    SEVERE CONCENTRIC HYPERTROPHY.LV FUNCTION WAS VIGOROUS.EF 65%-70%.VENTRICULAR SEPTUM-INCOORDINATE MOTION.LEFT ATRIUM-MILDLY DILATED.TRIVIAL TR.     Current Outpatient Prescriptions  Medication Sig Dispense Refill  . acetaminophen (TYLENOL) 500 MG tablet Take 500 mg by mouth 2 (two) times daily. Three tablets 2 times daily.    Marland Kitchen allopurinol (ZYLOPRIM) 100 MG tablet Take 1 tablet (100 mg total) by mouth daily. 90 tablet 3  . divalproex (DEPAKOTE ER) 500 MG 24 hr tablet take 1 tablet by mouth daily 30 tablet 0  . hydrochlorothiazide (MICROZIDE) 12.5 MG capsule Take 1 capsule (12.5 mg total) by mouth daily. 30 capsule 6  . losartan (COZAAR) 100 MG tablet Take 0.5 tablets (50 mg total) by mouth daily. 90 tablet 1  . metoprolol (LOPRESSOR) 50 MG tablet Take 1 tablet (50 mg total) by mouth 2 (two) times daily. 60 tablet 6  .  Nitroglycerin (NITROSTAT SL) Place 1 tablet under the tongue every 5 (five) minutes x 3 doses as needed. For chest    . OLANZapine (ZYPREXA) 5 MG tablet take 1/2 tablet by mouth at bedtime 30 tablet 0  . rosuvastatin (CRESTOR) 5 MG tablet Take 1 tablet (5 mg total) by mouth every evening. 30 tablet 11  . SAPHRIS 10 MG SUBL Take 1 tablet by mouth at bedtime.  0   No current facility-administered medications for this visit.    Allergies:   Abilify; Aripiprazole; Benadryl; Remeron; Codeine; Cogentin; Haloperidol  lactate; Latuda; and Latex    Social History:  The patient  reports that she has been smoking Cigarettes.  She has been smoking about 0.25 packs per day. She has never used smokeless tobacco. She reports that she does not drink alcohol or use illicit drugs.   Family History:  The patient's family history includes Diabetes type II in her son; Heart disease in her father and mother; Kidney disease in her father and mother; Stroke in her sister.    ROS:  Please see the history of present illness.    Otherwise, review of systems positive for none.   All other systems are reviewed and negative.    PHYSICAL EXAM: VS:  BP 138/70 mmHg  Pulse 58  Resp 20  Ht 5\' 6"  (1.676 m)  Wt 264 lb (119.75 kg)  BMI 42.63 kg/m2 , BMI Body mass index is 42.63 kg/(m^2).  General: Alert, oriented x3, no distress Head: no evidence of trauma, PERRL, EOMI, no exophtalmos or lid lag, no myxedema, no xanthelasma; normal ears, nose and oropharynx Neck: normal jugular venous pulsations and no hepatojugular reflux; brisk carotid pulses without delay and no carotid bruits Chest: clear to auscultation, no signs of consolidation by percussion or palpation, normal fremitus, symmetrical and full respiratory excursions Cardiovascular: normal position and quality of the apical impulse, regular rhythm, normal first and second heart sounds, no murmurs, rubs or gallops Abdomen: no tenderness or distention, no masses by palpation, no abnormal pulsatility or arterial bruits, normal bowel sounds, no hepatosplenomegaly Extremities: no clubbing, cyanosis or edema; 2+ radial, ulnar and brachial pulses bilaterally; 2+ right femoral, posterior tibial and dorsalis pedis pulses; 2+ left femoral, posterior tibial and dorsalis pedis pulses; no subclavian or femoral bruits Neurological: grossly nonfocal Psych: euthymic mood, full affect   EKG:  EKG is not ordered today.   Recent Labs: 12/02/2013: TSH 2.870    Lipid Panel      Component Value Date/Time   CHOL 122 11/19/2010 0605   TRIG 73 11/19/2010 0605   HDL 45 11/19/2010 0605   CHOLHDL 2.7 11/19/2010 0605   VLDL 15 11/19/2010 0605   LDLCALC 62 11/19/2010 0605   LDLDIRECT 80 03/20/2012 1510      Wt Readings from Last 3 Encounters:  05/10/14 264 lb (119.75 kg)  03/15/14 252 lb (114.306 kg)  12/29/13 240 lb (108.863 kg)     ASSESSMENT AND PLAN:  Dual-chamber permanent pacemaker approaching elective replacement indicator. She is pacemaker dependent. Will start monthly checks   Current medicines are reviewed at length with the patient today.  The patient does not have concerns regarding medicines.  The following changes have been made:  no change  Labs/ tests ordered today include:  No orders of the defined types were placed in this encounter.    Madaline Guthrie, MD  05/10/2014 5:47 PM    Sanda Klein, MD, [facility] [phone] office [phone] pager

## 2014-05-13 LAB — MDC_IDC_ENUM_SESS_TYPE_INCLINIC
Battery Remaining Longevity: 6 mo — CL
Brady Statistic RA Percent Paced: 80 %
Brady Statistic RV Percent Paced: 100 %
Implantable Pulse Generator Serial Number: 2706033182
Lead Channel Impedance Value: 500 Ohm
Lead Channel Impedance Value: 550 Ohm
Lead Channel Pacing Threshold Amplitude: 1.25 V
Lead Channel Pacing Threshold Pulse Width: 0.4 ms
Lead Channel Sensing Intrinsic Amplitude: 1.25 mV
Lead Channel Setting Pacing Amplitude: 2 V
Lead Channel Setting Pacing Amplitude: 2 V
Lead Channel Setting Pacing Pulse Width: 0.4 ms
Lead Channel Setting Sensing Sensitivity: 2 mV

## 2014-05-14 ENCOUNTER — Encounter: Payer: Self-pay | Admitting: Family Medicine

## 2014-05-14 ENCOUNTER — Ambulatory Visit (INDEPENDENT_AMBULATORY_CARE_PROVIDER_SITE_OTHER): Payer: Medicare Other | Admitting: Family Medicine

## 2014-05-14 VITALS — BP 121/52 | HR 71 | Temp 99.0°F | Ht 66.0 in | Wt 244.0 lb

## 2014-05-14 DIAGNOSIS — R300 Dysuria: Secondary | ICD-10-CM | POA: Diagnosis present

## 2014-05-14 DIAGNOSIS — I2510 Atherosclerotic heart disease of native coronary artery without angina pectoris: Secondary | ICD-10-CM

## 2014-05-14 DIAGNOSIS — I10 Essential (primary) hypertension: Secondary | ICD-10-CM

## 2014-05-14 DIAGNOSIS — R600 Localized edema: Secondary | ICD-10-CM

## 2014-05-14 LAB — POCT URINALYSIS DIPSTICK
Bilirubin, UA: NEGATIVE
Glucose, UA: NEGATIVE
Leukocytes, UA: NEGATIVE
Nitrite, UA: NEGATIVE
PH UA: 5.5
Protein, UA: >=300
Spec Grav, UA: 1.025
Urobilinogen, UA: 0.2

## 2014-05-14 MED ORDER — FLUCONAZOLE 150 MG PO TABS: 150.0000 mg | ORAL_TABLET | Freq: Once | ORAL | Status: DC

## 2014-05-14 NOTE — Assessment & Plan Note (Signed)
Patient currently well controlled today. According to son, she has been higher at home occasionally. We will continue to watch this.

## 2014-05-14 NOTE — Assessment & Plan Note (Signed)
Patient did have Left leg edema on exam. She states she has been significantly less mobile over the past few months according to her son. She rarely uses her walker and is seated most of the day. On exam her legs her non-tender, pulses were present bilaterally, and both extremities were warm to the touch. This is likely 2/2 chronic venous disease. Patient has bilateral leg edema in her history. My only concern is that this is now changed to being unilateral, and there was no mention of leg swelling in the cardiologist's noted from their visit 4 days ago (4/25).  - I initially decided due to her history and her recent diminished activity level that no imaging was necessary, however after further consideration I feel as though a venous duplex of her left leg would be important to r/o any DVT due to some of these same reasons (recent immobility, hx of edema, hx smoking, CAD). - I have called patient to inform her of my decision to place an order for an [place] of her left leg. She stated her understanding that I would like this done sometime tomorrow. I have already left a message w/ my nursing staff asking them to contact patient on Monday to ensure patient had this US performed (pt. With hx of memory deficits).

## 2014-05-14 NOTE — Assessment & Plan Note (Signed)
Patient with one week hx of dysuria and vaginal itching. Symptoms c/w candida vulvovaginitis. Discharge present is thick and white. Malodorous in nature. Bacterial urinary tract infection also a likely diagnosis but UA has no evidence of bacterial infection.  - Fluconazole 150mg  once prescribed >> discussion was had with patient about any symptoms of CP, SOB, tightness, palpitation due to this medications ability to prolong QT internal.

## 2014-05-14 NOTE — Progress Notes (Signed)
HPI CC: Vaginal itching/discharge  # vaginal itching:  Been ongoing for about a week  Discharge present is thick and white  Some dysuria present. Malodorous as well  Has had symptoms like this in the past; went away w/ "just one pill" ROS: no fevers, chills, diaphoresis, flank pain, hematuria, frequency  # Hypertension BP Readings from Last 3 Encounters:  05/14/14 121/52  05/10/14 138/70  03/15/14 141/75   Home BP monitoring - via home aid Compliant with medications- yes without side effects ROS-Denies any CP, HA, SOB, blurry vision, transient weakness, orthopnea, PND.   Review of Systems   See HPI for ROS. All other systems reviewed and are negative.  Past medical history and social history reviewed and updated in the EMR as appropriate.  Objective: BP 121/52 mmHg  Pulse 71  Temp(Src) 99 F (37.2 C) (Oral)  Ht 5\' 6"  (1.676 m)  Wt 244 lb (110.678 kg)  BMI 39.40 kg/m2  SpO2 96% Gen: NAD, alert, cooperative, and pleasant. Obese HEENT: NCAT, EOMI, PERRL CV: RRR  Resp: CTAB, no wheezes, non-labored Abd: SNTND, BS present, no guarding or organomegaly, large panus  Ext: Warm, pulses present bilaterally, Lt leg significantly thicker than Rt. No pain to palpation of either extremity. Moves each with ease.  Neuro: Alert and oriented, Speech clear, tardive dyskinesia noted  Assessment and plan:  Dysuria Patient with one week hx of dysuria and vaginal itching. Symptoms c/w candida vulvovaginitis. Discharge present is thick and white. Malodorous in nature. Bacterial urinary tract infection also a likely diagnosis but UA has no evidence of bacterial infection.  - Fluconazole 150mg  once prescribed >> discussion was had with patient about any symptoms of CP, SOB, tightness, palpitation due to this medications ability to prolong QT internal.     HTN (hypertension) Patient currently well controlled today. According to son, she has been higher at home occasionally. We will  continue to watch this.    Leg edema, left Patient did have Left leg edema on exam. She states she has been significantly less mobile over the past few months according to her son. She rarely uses her walker and is seated most of the day. On exam her legs her non-tender, pulses were present bilaterally, and both extremities were warm to the touch. This is likely 2/2 chronic venous disease. Patient has bilateral leg edema in her history. My only concern is that this is now changed to being unilateral, and there was no mention of leg swelling in the cardiologist's noted from their visit 4 days ago (4/25).  - I initially decided due to her history and her recent diminished activity level that no imaging was necessary, however after further consideration I feel as though a venous duplex of her left leg would be important to r/o any DVT due to some of these same reasons (recent immobility, hx of edema, hx smoking, CAD). - I have called patient to inform her of my decision to place an order for an [place] of her left leg. She stated her understanding that I would like this done sometime tomorrow. I have already left a message w/ my nursing staff asking them to contact patient on Monday to ensure patient had this US performed (pt. With hx of memory deficits).    Orders Placed This Encounter  Procedures  . POCT urinalysis dipstick    Meds ordered this encounter  Medications  . fluconazole (DIFLUCAN) 150 MG tablet    Sig: Take 1 tablet (150 mg total) by mouth  once.    Dispense:  1 tablet    Refill:  0     Elberta Leatherwood, MD,MS,  PGY1 05/14/2014 8:18 PM

## 2014-05-14 NOTE — Patient Instructions (Addendum)
It was a pleasure seeing you today in our clinic. Today we discussed blood pressure, vaginal itching, and decreased ambulation. Here is the treatment plan we have discussed and agreed upon together:   - Today your blood pressure looks great. At this time there is no reason to change your current medications. If your home aid is getting high readings then I would recommend she recheck the sizing of the cuff. If they continue to be high then we can continue to watch this. - I have written a prescription for fluconazole for your vaginal itching. If you experience any chest pain or shortness of breath then please report to the nearest Emergency department - Increased your daily amount of walking is really going to help you long-term. I would appreciate if you would make any/all extra efforts to use your walker when you can.

## 2014-05-15 ENCOUNTER — Other Ambulatory Visit: Payer: Self-pay | Admitting: Cardiovascular Disease

## 2014-05-17 ENCOUNTER — Telehealth: Payer: Self-pay | Admitting: *Deleted

## 2014-05-17 ENCOUNTER — Telehealth: Payer: Self-pay | Admitting: Family Medicine

## 2014-05-17 NOTE — Telephone Encounter (Signed)
Pt called again. Would like antibotic for infection

## 2014-05-17 NOTE — Telephone Encounter (Signed)
Tried to call patient but line was busy.  Lindy Garczynski,CMA

## 2014-05-17 NOTE — Telephone Encounter (Signed)
Made in error / sr

## 2014-05-17 NOTE — Telephone Encounter (Signed)
-----   Message from Elberta Leatherwood, MD sent at 05/14/2014  7:50 PM EDT ----- Hi, after this patient left on Friday I thought more of her symptoms and felt that she really needed an Korea of her Left Leg to r/o DVT. I called patient today (friday) to ask her to get this over the weekend. She stated her understanding but this patient has some memory deficits.  Could one of you please call to make sure she remembered to get this done? And if not ask that she does? I sent the request to be done at [facility], and again, it's for an ultrasound.  Thanks!

## 2014-05-17 NOTE — Telephone Encounter (Signed)
Spoke with son and he will have patient call us back.  I have her schedule currently for 05/18/2014 but he is unsure if she will have transportation.  I asked him to have her call me by the end of the day so I can change it if necessary. Jazmin Hartsell,CMA

## 2014-05-17 NOTE — Telephone Encounter (Signed)
Pt called and said that she would like blood work done. jw

## 2014-05-17 NOTE — Telephone Encounter (Signed)
Rx refill sent to patient pharmacy   

## 2014-05-17 NOTE — Telephone Encounter (Signed)
Spoke with patient and she is aware that provider called and changed appt to Thursday. Ziyon Soltau,CMA

## 2014-05-17 NOTE — Telephone Encounter (Signed)
Called doppler dept at [facility] and they will need to call me back to schedule this appt.  [id number]. Abigail Wallace,CMA

## 2014-05-18 ENCOUNTER — Ambulatory Visit (HOSPITAL_COMMUNITY): Payer: Medicare Other

## 2014-05-20 ENCOUNTER — Ambulatory Visit (HOSPITAL_COMMUNITY): Payer: Medicare Other

## 2014-05-20 NOTE — Telephone Encounter (Signed)
Phone call completed. See previous phone note.

## 2014-05-25 ENCOUNTER — Telehealth: Payer: Self-pay | Admitting: Family Medicine

## 2014-05-25 ENCOUNTER — Other Ambulatory Visit: Payer: Self-pay | Admitting: Family Medicine

## 2014-05-25 NOTE — Telephone Encounter (Signed)
Had some computer problems so I had to call pts. Pharmacy. Prescription for Macrobid 100mg  BID x7days (14tabs) requested to pharmacist. Should be waiting for patient when they arrive.  Called patient. Talked to both patient and her son (does a significant amount of her care taking). I informed them of the prescription waiting for them. I discussed with them the warning signs of sepsis to look for (fevers, chills, diaphoresis, AMS, etc), and they stated their understanding.  I also talked to them about the fact that the LE doppler was not performed. Pt said she didn't want to have this done. I explained to her the possibility of DVT and the complications that you have have from these. She still did not want to have this done. Her son was upset/frustrated by his mothers choice not to have it performed.

## 2014-05-25 NOTE — Telephone Encounter (Signed)
pts son is reporting that pt has brown colored urine, has an odor and pt seems to be more irritable. Thinks she has a uti, [place] transportation so cannot make the appt for several days. Pts son says she has a hx of uti's and wants to know if MD could prescribe abx without being seen.

## 2014-05-26 ENCOUNTER — Other Ambulatory Visit: Payer: Self-pay | Admitting: Family Medicine

## 2014-05-26 ENCOUNTER — Telehealth: Payer: Self-pay | Admitting: Family Medicine

## 2014-05-26 NOTE — Telephone Encounter (Signed)
Pt would like the doctor to call her about her up coming procedure. She has a lot of questions. jw

## 2014-05-31 ENCOUNTER — Other Ambulatory Visit: Payer: Self-pay | Admitting: *Deleted

## 2014-05-31 MED ORDER — NITROGLYCERIN 0.4 MG SL SUBL: 0.4000 mg | SUBLINGUAL_TABLET | SUBLINGUAL | Status: DC | PRN

## 2014-06-01 ENCOUNTER — Telehealth: Payer: Self-pay | Admitting: Family Medicine

## 2014-06-01 NOTE — Telephone Encounter (Signed)
Pt called because several years ago she had a cream that we prescribed to her for sores. She can not remember the name and I couldn't find any cream for sores in her medication list. Anyway she would like some called in. jw

## 2014-06-02 ENCOUNTER — Telehealth: Payer: Self-pay | Admitting: Cardiovascular Disease

## 2014-06-03 NOTE — Telephone Encounter (Signed)
Close encounter 

## 2014-06-04 NOTE — Telephone Encounter (Signed)
There is absolutely no way I can prescribe something I've never prescribed for something I've never assessed in the office. Especially when I don't even know what the medication is.  Please let her know I'm going to have to see her in the office, as I am on nights as would rather not call after hours.  Thank you. Loa Socks

## 2014-06-04 NOTE — Telephone Encounter (Signed)
Spoke with pt and attempted to schedule an appt. She declined saying that she never had a problem with them sending the cream before and that she didn't want to make an appt. Deseree Kennon Holter, CMA

## 2014-06-09 ENCOUNTER — Other Ambulatory Visit: Payer: Self-pay | Admitting: Cardiovascular Disease

## 2014-06-09 NOTE — Telephone Encounter (Signed)
Rx has been sent to the pharmacy electronically. ° °

## 2014-06-15 ENCOUNTER — Ambulatory Visit (INDEPENDENT_AMBULATORY_CARE_PROVIDER_SITE_OTHER): Payer: Medicare Other | Admitting: Cardiovascular Disease

## 2014-06-15 ENCOUNTER — Encounter: Payer: Self-pay | Admitting: Cardiovascular Disease

## 2014-06-15 VITALS — BP 129/75 | HR 69 | Ht 66.0 in | Wt 267.0 lb

## 2014-06-15 DIAGNOSIS — I5032 Chronic diastolic (congestive) heart failure: Secondary | ICD-10-CM

## 2014-06-15 DIAGNOSIS — I2510 Atherosclerotic heart disease of native coronary artery without angina pectoris: Secondary | ICD-10-CM | POA: Diagnosis not present

## 2014-06-15 DIAGNOSIS — I10 Essential (primary) hypertension: Secondary | ICD-10-CM

## 2014-06-15 DIAGNOSIS — I495 Sick sinus syndrome: Secondary | ICD-10-CM | POA: Diagnosis not present

## 2014-06-15 DIAGNOSIS — Z950 Presence of cardiac pacemaker: Secondary | ICD-10-CM

## 2014-06-15 DIAGNOSIS — I442 Atrioventricular block, complete: Secondary | ICD-10-CM

## 2014-06-15 LAB — CUP PACEART INCLINIC DEVICE CHECK
Battery Impedance: 4500 Ohm
Brady Statistic RA Percent Paced: 83 %
Brady Statistic RV Percent Paced: 100 %
Lead Channel Impedance Value: 450 Ohm
Lead Channel Impedance Value: 500 Ohm
Lead Channel Pacing Threshold Amplitude: 1 V
Lead Channel Pacing Threshold Amplitude: 1.25 V
Lead Channel Pacing Threshold Pulse Width: 0.4 ms
Lead Channel Setting Pacing Amplitude: 2 V
Lead Channel Setting Pacing Pulse Width: 0.4 ms
Lead Channel Setting Sensing Sensitivity: 2 mV
MDC IDC MSMT BATTERY VOLTAGE: 2.65 V
MDC IDC MSMT LEADCHNL RA IMPEDANCE VALUE: 500 Ohm
MDC IDC MSMT LEADCHNL RA IMPEDANCE VALUE: 550 Ohm
MDC IDC MSMT LEADCHNL RA PACING THRESHOLD PULSEWIDTH: 0.4 ms
MDC IDC MSMT LEADCHNL RA SENSING INTR AMPL: 1.5 mV
MDC IDC MSMT LEADCHNL RV SENSING INTR AMPL: 7.5 mV
MDC IDC PG SERIAL: 2706033182
MDC IDC SESS DTM: 20160531123850
MDC IDC SET LEADCHNL RV PACING AMPLITUDE: 2 V

## 2014-06-15 NOTE — Progress Notes (Signed)
Patient ID: Abigail Wallace, female   DOB: 1945/06/11, 69 y.o.   MRN: [id number]     Cardiology Office Note   Date:  06/15/2014   ID:  [id number] [place], [place] 10/13/1945, MRN [id number]  PCP:  Elberta Leatherwood, MD  Cardiologist:   Sanda Klein, MD   Chief Complaint  Patient presents with  . Follow-up    patient reports chest pain and shortness of breath.  . Medication Problem      History of Present Illness: Abigail Wallace is a 69 y.o. female who presents for pacemaker check. She is device dependent due to complete heart block (although today has 2:1 AV block with ventricular rate of 30). Her dual chamber permanent pacemaker is approaching elective replacement indicator, estimated generator longevity is less than 6 months (reported as less than 6 months for a year now).   She has a history of schizophrenia with psychosis requiring hospitalization in 2012. Since then she has done well and lives independently. Her son, Abigail Wallace ,accompanies her to the visit today (as always) and lives close by.  She received a dual-chamber permanent pacemaker in 2007 for symptomatic AV block. Her device is a Vitatron T. 50 DR. Serial number [id number]. She has 100% ventricular pacing but does have an underlying ventricular rhythm at about 37 beats per minute. She also paces the atrium 80% of the time. The lead parameters are excellent. She has minimal coronary atherosclerosis by cardiac catheterization performed in 2007 and no evidence of insufficiency by nuclear perfusion testing in 2012. By echocardiography she has normal left ventricular size and systolic function and no major structural cardiac abnormalities.  Noncardiac history includes chronic back pain, obesity and orthostatic hypotension. She smokes roughly 5 cigarettes a day and has been diagnosed with COPD, but I don't think this has been formally documented.    Past Medical History  Diagnosis Date  . Cystitis     Frequent episodes.   . Gout   .  COPD (chronic obstructive pulmonary disease)   . GERD (gastroesophageal reflux disease)   . Mental disorder   . Psychosis   . Memory difficulties 12/02/2013  . Tardive dyskinesia 12/02/2013  . Hypertension   . Coronary artery disease     NON-CRITICAL    Past Surgical History  Procedure Laterality Date  . Pacemaker placement  09/28/2005    2/2 SSS?  Marland Kitchen Cholecystectomy    . Tubal ligation    . Persantine stress test  05/01/2010    EF 66%. Normal LV sys fx. Unchanged from previous studies.   . Cardiac catheterization  2007    Non-critical.   . Insert / replace / remove pacemaker  2007    [place]/SYMPTOMATIC HEART BLOCK  . Transthoracic echocardiogram  09/08/10    SEVERE CONCENTRIC HYPERTROPHY.LV FUNCTION WAS VIGOROUS.EF 65%-70%.VENTRICULAR SEPTUM-INCOORDINATE MOTION.LEFT ATRIUM-MILDLY DILATED.TRIVIAL TR.     Current Outpatient Prescriptions  Medication Sig Dispense Refill  . acetaminophen (TYLENOL) 500 MG tablet Take 500 mg by mouth 2 (two) times daily. Three tablets 2 times daily.    Marland Kitchen allopurinol (ZYLOPRIM) 100 MG tablet take 1 tablet by mouth once daily 90 tablet 1  . divalproex (DEPAKOTE ER) 500 MG 24 hr tablet take 1 tablet by mouth daily 30 tablet 0  . fluconazole (DIFLUCAN) 150 MG tablet Take 1 tablet (150 mg total) by mouth once. 1 tablet 0  . hydrochlorothiazide (MICROZIDE) 12.5 MG capsule Take 1 capsule (12.5 mg total) by mouth daily. 30 capsule 6  . losartan (  COZAAR) 100 MG tablet Take 0.5 tablets (50 mg total) by mouth daily. 90 tablet 1  . metoprolol (LOPRESSOR) 50 MG tablet take 1 tablet by mouth twice a day 60 tablet 6  . nitroGLYCERIN (NITROSTAT) 0.4 MG SL tablet Place 1 tablet (0.4 mg total) under the tongue every 5 (five) minutes x 3 doses as needed. For chest 25 tablet 1  . OLANZapine (ZYPREXA) 10 MG tablet Take 10 mg by mouth at bedtime.  0  . QUEtiapine (SEROQUEL) 25 MG tablet Take 1 tablet by mouth at bedtime.  0  . rosuvastatin (CRESTOR) 5 MG tablet Take 1  tablet (5 mg total) by mouth every evening. 30 tablet 11   No current facility-administered medications for this visit.    Allergies:   Abilify; Aripiprazole; Benadryl; Remeron; Codeine; Cogentin; Haloperidol lactate; Latuda; and Latex    Social History:  The patient  reports that she has been smoking Cigarettes.  She has been smoking about 0.25 packs per day. She has never used smokeless tobacco. She reports that she does not drink alcohol or use illicit drugs.   Family History:  The patient's family history includes Diabetes type II in her son; Heart disease in her father and mother; Kidney disease in her father and mother; Stroke in her sister.    ROS:  Please see the history of present illness.    Otherwise, review of systems positive for none.   All other systems are reviewed and negative.    PHYSICAL EXAM: VS:  BP 129/75 mmHg  Pulse 69  Ht 5\' 6"  (1.676 m)  Wt 121.11 kg (267 lb)  BMI 43.12 kg/m2 , BMI Body mass index is 43.12 kg/(m^2).  General: Alert, oriented x3, no distress Head: no evidence of trauma, PERRL, EOMI, no exophtalmos or lid lag, no myxedema, no xanthelasma; normal ears, nose and oropharynx Neck: normal jugular venous pulsations and no hepatojugular reflux; brisk carotid pulses without delay and no carotid bruits Chest: clear to auscultation, no signs of consolidation by percussion or palpation, normal fremitus, symmetrical and full respiratory excursions Cardiovascular: normal position and quality of the apical impulse, regular rhythm, normal first and second heart sounds, no murmurs, rubs or gallops Abdomen: no tenderness or distention, no masses by palpation, no abnormal pulsatility or arterial bruits, normal bowel sounds, no hepatosplenomegaly Extremities: no clubbing, cyanosis or edema; 2+ radial, ulnar and brachial pulses bilaterally; 2+ right femoral, posterior tibial and dorsalis pedis pulses; 2+ left femoral, posterior tibial and dorsalis pedis pulses; no  subclavian or femoral bruits Neurological: grossly nonfocal Psych: euthymic mood, full affect   EKG:  EKG is ordered today. The ekg ordered today demonstrates A-V sequential pacing   Recent Labs: 12/02/2013: TSH 2.870    Lipid Panel    Component Value Date/Time   CHOL 122 11/19/2010 0605   TRIG 73 11/19/2010 0605   HDL 45 11/19/2010 0605   CHOLHDL 2.7 11/19/2010 0605   VLDL 15 11/19/2010 0605   LDLCALC 62 11/19/2010 0605   LDLDIRECT 80 03/20/2012 1510      Wt Readings from Last 3 Encounters:  06/15/14 121.11 kg (267 lb)  05/14/14 110.678 kg (244 lb)  05/10/14 119.75 kg (264 lb)     ASSESSMENT AND PLAN:  Full pacemaker check today shows normal function, 83% A paced, 100% V paced, good lead parameters, no tachyarrhythmia Dual-chamber permanent pacemaker approaching elective replacement indicator. She is pacemaker dependent. Will continue monthly checks  Current medicines are reviewed at length with the patient today.  The patient has concerns regarding medicines. We went over her cardiac meds one by one.  The following changes have been made:  no change  Labs/ tests ordered today include:  No orders of the defined types were placed in this encounter.    Patient Instructions  Your physician recommends that you schedule a follow-up appointment on 07/20/2014 & 08/24/2014 with Dr.Demarie Hyneman + pacemaker    Signed, Sanda Klein, MD  06/15/2014 1:39 PM    Sanda Klein, MD, [facility] [phone] office [phone] pager

## 2014-06-15 NOTE — Patient Instructions (Signed)
Your physician recommends that you schedule a follow-up appointment on 07/20/2014 & 08/24/2014 with Dr.Croitoru + pacemaker

## 2014-06-18 ENCOUNTER — Ambulatory Visit (HOSPITAL_COMMUNITY): Payer: Medicare Other

## 2014-06-29 ENCOUNTER — Encounter: Payer: Self-pay | Admitting: Cardiovascular Disease

## 2014-07-15 ENCOUNTER — Telehealth: Payer: Self-pay | Admitting: Cardiovascular Disease

## 2014-07-15 NOTE — Telephone Encounter (Signed)
WILL DEFER TO SHAKILA IN THE PACER CLINIC FOR ADVISE TO PATIENT.

## 2014-07-15 NOTE — Telephone Encounter (Signed)
Pt's son called in stating that his mother had to cancel her pace maker appt on 7/5 because she will not have transportation. She has another pacer check  appt on 8/9 and he wanted to make sure that it is safe for her to wait that long for her next appt because her battery levels are being monitored. Please f/u   Thanks

## 2014-07-16 NOTE — Telephone Encounter (Signed)
Informed son that after reaching ERI patient would still have 3 months of longevity before she would be in danger of losing function of ppm. Patient's last check was 5/31, so I told him that 8/9 would be fine. I reiterated the importance of keeping the appointment. Son voiced understanding.

## 2014-07-16 NOTE — Telephone Encounter (Signed)
Has this been taken care of?

## 2014-07-20 ENCOUNTER — Encounter: Payer: Self-pay | Admitting: Cardiovascular Disease

## 2014-07-30 ENCOUNTER — Other Ambulatory Visit: Payer: Self-pay | Admitting: Cardiovascular Disease

## 2014-07-30 NOTE — Telephone Encounter (Signed)
REFILL 

## 2014-08-15 ENCOUNTER — Other Ambulatory Visit: Payer: Self-pay | Admitting: Cardiovascular Disease

## 2014-08-16 NOTE — Telephone Encounter (Signed)
REFILL 

## 2014-08-24 ENCOUNTER — Other Ambulatory Visit: Payer: Self-pay | Admitting: *Deleted

## 2014-08-24 ENCOUNTER — Encounter: Payer: Self-pay | Admitting: Cardiovascular Disease

## 2014-08-24 ENCOUNTER — Ambulatory Visit (INDEPENDENT_AMBULATORY_CARE_PROVIDER_SITE_OTHER): Payer: Medicare Other | Admitting: Cardiovascular Disease

## 2014-08-24 VITALS — BP 144/72 | HR 62 | Resp 16 | Ht 65.0 in | Wt 265.0 lb

## 2014-08-24 DIAGNOSIS — Z79899 Other long term (current) drug therapy: Secondary | ICD-10-CM | POA: Diagnosis not present

## 2014-08-24 DIAGNOSIS — I442 Atrioventricular block, complete: Secondary | ICD-10-CM | POA: Diagnosis not present

## 2014-08-24 DIAGNOSIS — Z7901 Long term (current) use of anticoagulants: Secondary | ICD-10-CM

## 2014-08-24 DIAGNOSIS — Z45010 Encounter for checking and testing of cardiac pacemaker pulse generator [battery]: Secondary | ICD-10-CM

## 2014-08-24 DIAGNOSIS — I495 Sick sinus syndrome: Secondary | ICD-10-CM

## 2014-08-24 DIAGNOSIS — I2510 Atherosclerotic heart disease of native coronary artery without angina pectoris: Secondary | ICD-10-CM | POA: Diagnosis not present

## 2014-08-24 LAB — CUP PACEART INCLINIC DEVICE CHECK
Date Time Interrogation Session: 20160809155122
Lead Channel Impedance Value: 500 Ohm
Lead Channel Setting Pacing Amplitude: 2 V
Lead Channel Setting Pacing Pulse Width: 0.4 ms
MDC IDC MSMT LEADCHNL RV IMPEDANCE VALUE: 550 Ohm
MDC IDC SET LEADCHNL RV PACING AMPLITUDE: 2 V
MDC IDC SET LEADCHNL RV SENSING SENSITIVITY: 2 mV
MDC IDC STAT BRADY RA PERCENT PACED: 93 %
MDC IDC STAT BRADY RV PERCENT PACED: 100 %
Pulse Gen Serial Number: 2706033182

## 2014-08-24 LAB — PROTIME-INR
INR: 0.97 (ref <1.50)
PROTHROMBIN TIME: 12.9 s (ref 11.6–15.2)

## 2014-08-24 LAB — APTT: aPTT: 24 seconds (ref 24–37)

## 2014-08-24 NOTE — Patient Instructions (Addendum)
Your physician has recommended that you have a pacemaker battery change out Tuesday August 16th ([employer]) . This is done as an out-patient at [facility].  Your physician recommends that you return for lab work in: today at [facility].

## 2014-08-26 ENCOUNTER — Encounter: Payer: Self-pay | Admitting: Cardiovascular Disease

## 2014-08-26 DIAGNOSIS — Z45010 Encounter for checking and testing of cardiac pacemaker pulse generator [battery]: Secondary | ICD-10-CM | POA: Insufficient documentation

## 2014-08-26 NOTE — Progress Notes (Signed)
Patient ID: Abigail Wallace, female   DOB: 1945/02/13, 69 y.o.   MRN: [id number]     Cardiology Office Note   Date:  08/26/2014   ID:  [address] [place], [place] September 10, 1945, MRN [id number]  PCP:  Georges Lynch, MD  Cardiologist:   Sanda Klein, MD   Chief Complaint  Patient presents with  . PACER FOLLOW UP    Patient says that her lips, legs. and knees have been bothering her.      History of Present Illness: Abigail Wallace is a 69 y.o. female who presents for  Pacemaker at elective replacement indicator. Her device reached ERI on July 27. She has noticed some shortness of breath since then, after the device which to VVI mode. She was reprogrammed DDDR today. Historically she has greater than 80% atrial pacing and has 100% ventricular pacing due to complete heart block as well as sinus node dysfunction. She has a very slow idioventricular escape rhythm at about 30 bpm. She just had labs performed yesterday with her primary care physician and these are normal. We rechecked coagulation studies today in anticipation of a pacemaker change.  She has a history of schizophrenia with psychosis requiring hospitalization in 2012. Since then she has done well and lives independently. Her son, [name] ,accompanies her to the visit today (as always) and lives close by.  She received a dual-chamber permanent pacemaker in 2007 for symptomatic AV block. Her device is a Vitatron T. 82 DR. Serial number [id number]. She has 100% ventricular pacing but does have an underlying ventricular rhythm at about 37 beats per minute. She also paces the atrium 80% of the time. The lead parameters are excellent. She has minimal coronary atherosclerosis by cardiac catheterization performed in 2007 and no evidence of insufficiency by nuclear perfusion testing in 2012. By echocardiography she has normal left ventricular size and systolic function and no major structural cardiac abnormalities.  Noncardiac history includes chronic back  pain, obesity and orthostatic hypotension. She smokes roughly 5 cigarettes a day and has been diagnosed with COPD, but I don't think this has been formally documented.  Past Medical History  Diagnosis Date  . Cystitis     Frequent episodes.   . Gout   . COPD (chronic obstructive pulmonary disease)   . GERD (gastroesophageal reflux disease)   . Mental disorder   . Psychosis   . Memory difficulties 12/02/2013  . Tardive dyskinesia 12/02/2013  . Hypertension   . Coronary artery disease     NON-CRITICAL    Past Surgical History  Procedure Laterality Date  . Pacemaker placement  09/28/2005    2/2 SSS?  Marland Kitchen Cholecystectomy    . Tubal ligation    . Persantine stress test  05/01/2010    EF 66%. Normal LV sys fx. Unchanged from previous studies.   . Cardiac catheterization  2007    Non-critical.   . Insert / replace / remove pacemaker  2007    [place]/SYMPTOMATIC HEART BLOCK  . Transthoracic echocardiogram  09/08/10    SEVERE CONCENTRIC HYPERTROPHY.LV FUNCTION WAS VIGOROUS.EF 65%-70%.VENTRICULAR SEPTUM-INCOORDINATE MOTION.LEFT ATRIUM-MILDLY DILATED.TRIVIAL TR.     Current Outpatient Prescriptions  Medication Sig Dispense Refill  . acetaminophen (TYLENOL) 500 MG tablet Take 2,000 mg by mouth at bedtime.     Marland Kitchen allopurinol (ZYLOPRIM) 100 MG tablet take 1 tablet by mouth once daily (Patient taking differently: Take 100 mg by mouth once daily) 90 tablet 1  . divalproex (DEPAKOTE ER) 500 MG 24 hr tablet take 1  tablet by mouth daily (Patient taking differently: Take 500 mg by mouth daily. ) 30 tablet 0  . fluconazole (DIFLUCAN) 150 MG tablet Take 1 tablet (150 mg total) by mouth once. (Patient not taking: Reported on 08/26/2014) 1 tablet 0  . hydrochlorothiazide (MICROZIDE) 12.5 MG capsule Take 1 capsule (12.5 mg total) by mouth daily. 30 capsule 6  . losartan (COZAAR) 100 MG tablet take 1/2 tablet by mouth once daily (Patient taking differently: Take 50 mg by mouth once daily) 90 tablet 0    . metoprolol (LOPRESSOR) 50 MG tablet take 1 tablet by mouth twice a day (Patient taking differently: Take 50 mg by mouth twice daily) 60 tablet 6  . nitroGLYCERIN (NITROSTAT) 0.4 MG SL tablet Place 1 tablet (0.4 mg total) under the tongue every 5 (five) minutes x 3 doses as needed. For chest 25 tablet 1  . OLANZapine (ZYPREXA) 10 MG tablet Take 10 mg by mouth at bedtime.  0  . QUEtiapine (SEROQUEL) 25 MG tablet Take 25 mg by mouth at bedtime.   0  . rosuvastatin (CRESTOR) 5 MG tablet Take 1 tablet (5 mg total) by mouth every evening. KEEP OV. 30 tablet 0  . naproxen sodium (ANAPROX) 220 MG tablet Take 440 mg by mouth at bedtime.     No current facility-administered medications for this visit.    Allergies:   Abilify; Aripiprazole; Benadryl; Cogentin; Haloperidol lactate; Latuda; Remeron; Codeine; and Latex    Social History:  The patient  reports that she has been smoking Cigarettes.  She has been smoking about 0.25 packs per day. She has never used smokeless tobacco. She reports that she does not drink alcohol or use illicit drugs.   Family History:  The patient's family history includes Diabetes type II in her son; Heart disease in her father and mother; Kidney disease in her father and mother; Stroke in her sister.    ROS:  Please see the history of present illness.    Otherwise, review of systems positive for none.   All other systems are reviewed and negative.    PHYSICAL EXAM: VS:  BP 144/72 mmHg  Pulse 62  Resp 16  Ht 5\' 5"  (1.651 m)  Wt 265 lb (120.203 kg)  BMI 44.10 kg/m2 , BMI Body mass index is 44.1 kg/(m^2).  General: Alert, oriented x3, no distress Head: no evidence of trauma, PERRL, EOMI, no exophtalmos or lid lag, no myxedema, no xanthelasma; normal ears, nose and oropharynx Neck: normal jugular venous pulsations and no hepatojugular reflux; brisk carotid pulses without delay and no carotid bruits Chest: clear to auscultation, no signs of consolidation by  percussion or palpation, normal fremitus, symmetrical and full respiratory excursions Cardiovascular: normal position and quality of the apical impulse, regular rhythm, normal first and second heart sounds, no  murmurs, rubs or gallops Abdomen: no tenderness or distention, no masses by palpation, no abnormal pulsatility or arterial bruits, normal bowel sounds, no hepatosplenomegaly Extremities: no clubbing, cyanosis or edema; 2+ radial, ulnar and brachial pulses bilaterally; 2+ right femoral, posterior tibial and dorsalis pedis pulses; 2+ left femoral, posterior tibial and dorsalis pedis pulses; no subclavian or femoral bruits Neurological: grossly nonfocal Psych: euthymic mood, full affect   EKG:  EKG is ordered today. The ekg ordered today demonstrates  AV sequential pacing   Recent Labs: 12/02/2013: TSH 2.870    Lipid Panel    Component Value Date/Time   CHOL 122 11/19/2010 0605   TRIG 73 11/19/2010 0605   HDL 45  11/19/2010 0605   CHOLHDL 2.7 11/19/2010 0605   VLDL 15 11/19/2010 0605   LDLCALC 62 11/19/2010 0605   LDLDIRECT 80 03/20/2012 1510      Wt Readings from Last 3 Encounters:  08/24/14 265 lb (120.203 kg)  06/15/14 267 lb (121.11 kg)  05/14/14 244 lb (110.678 kg)      ASSESSMENT AND PLAN:   Her lead parameters are excellent, plan to change the generator of her dual-chamber permanent pacemaker in the next few days.This procedure has been fully reviewed with the patient and written informed consent has been obtained.  I suspect her dyspnea is a sign of AV dyssynchrony following the change in pacemaker mode. If this is the case, her shortness of breath should promptly resolved after we reprogrammed her device dual chamber sequential pacing.     Current medicines are reviewed at length with the patient today.  The patient does not have concerns regarding medicines.  The following changes have been made:  no change  Labs/ tests ordered today include:  Orders  Placed This Encounter  Procedures  . APTT  . Protime-INR  . Implantable device check  . EKG 12-Lead  . PACEMAKER GENERATOR CHANGE     Patient Instructions  Your physician has recommended that you have a pacemaker battery change out Tuesday August 16th ([employer]) . This is done as an out-patient at [facility].  Your physician recommends that you return for lab work in: today at [facility].        Mikael Spray, MD  08/26/2014 12:46 PM    Sanda Klein, MD, [facility] [phone] office [phone] pager

## 2014-08-31 ENCOUNTER — Encounter: Payer: Self-pay | Admitting: Cardiovascular Disease

## 2014-08-31 ENCOUNTER — Ambulatory Visit (HOSPITAL_COMMUNITY)
Admission: RE | Admit: 2014-08-31 | Discharge: 2014-08-31 | Disposition: A | Discharge status: Home / Self Care | Payer: Medicare Other | Source: Ambulatory Visit | Attending: Cardiovascular Disease | Admitting: Cardiovascular Disease

## 2014-08-31 ENCOUNTER — Encounter (HOSPITAL_COMMUNITY)
Admission: RE | Disposition: A | Discharge status: Home / Self Care | Payer: Medicare Other | Source: Ambulatory Visit | Attending: Cardiovascular Disease

## 2014-08-31 DIAGNOSIS — Z6841 Body Mass Index (BMI) 40.0 and over, adult: Secondary | ICD-10-CM | POA: Diagnosis not present

## 2014-08-31 DIAGNOSIS — M109 Gout, unspecified: Secondary | ICD-10-CM | POA: Diagnosis not present

## 2014-08-31 DIAGNOSIS — K219 Gastro-esophageal reflux disease without esophagitis: Secondary | ICD-10-CM | POA: Diagnosis not present

## 2014-08-31 DIAGNOSIS — F209 Schizophrenia, unspecified: Secondary | ICD-10-CM | POA: Diagnosis not present

## 2014-08-31 DIAGNOSIS — I495 Sick sinus syndrome: Secondary | ICD-10-CM | POA: Insufficient documentation

## 2014-08-31 DIAGNOSIS — Z45010 Encounter for checking and testing of cardiac pacemaker pulse generator [battery]: Secondary | ICD-10-CM | POA: Insufficient documentation

## 2014-08-31 DIAGNOSIS — Z79899 Other long term (current) drug therapy: Secondary | ICD-10-CM | POA: Insufficient documentation

## 2014-08-31 DIAGNOSIS — F17210 Nicotine dependence, cigarettes, uncomplicated: Secondary | ICD-10-CM | POA: Diagnosis not present

## 2014-08-31 DIAGNOSIS — I442 Atrioventricular block, complete: Secondary | ICD-10-CM | POA: Diagnosis not present

## 2014-08-31 DIAGNOSIS — J449 Chronic obstructive pulmonary disease, unspecified: Secondary | ICD-10-CM | POA: Diagnosis not present

## 2014-08-31 DIAGNOSIS — I10 Essential (primary) hypertension: Secondary | ICD-10-CM | POA: Diagnosis not present

## 2014-08-31 DIAGNOSIS — Z791 Long term (current) use of non-steroidal anti-inflammatories (NSAID): Secondary | ICD-10-CM | POA: Insufficient documentation

## 2014-08-31 DIAGNOSIS — I2510 Atherosclerotic heart disease of native coronary artery without angina pectoris: Secondary | ICD-10-CM | POA: Insufficient documentation

## 2014-08-31 DIAGNOSIS — E669 Obesity, unspecified: Secondary | ICD-10-CM | POA: Insufficient documentation

## 2014-08-31 HISTORY — PX: EP IMPLANTABLE DEVICE: SHX172B

## 2014-08-31 LAB — BASIC METABOLIC PANEL
Anion gap: 12 (ref 5–15)
BUN: 20 mg/dL (ref 6–20)
CO2: 25 mmol/L (ref 22–32)
Calcium: 9.3 mg/dL (ref 8.9–10.3)
Chloride: 98 mmol/L — ABNORMAL LOW (ref 101–111)
Creatinine, Ser: 0.99 mg/dL (ref 0.44–1.00)
GFR calc Af Amer: >60 mL/min (ref >60)
GFR, EST NON AFRICAN AMERICAN: 56 mL/min — AB (ref >60)
GLUCOSE: 92 mg/dL (ref 65–99)
POTASSIUM: 4.3 mmol/L (ref 3.5–5.1)
Sodium: 135 mmol/L (ref 135–145)

## 2014-08-31 LAB — CBC
HEMATOCRIT: 37.5 % (ref 36.0–46.0)
HEMOGLOBIN: 12.5 g/dL (ref 12.0–15.0)
MCH: 28.9 pg (ref 26.0–34.0)
MCHC: 33.3 g/dL (ref 30.0–36.0)
MCV: 86.8 fL (ref 78.0–100.0)
Platelets: 224 10*3/uL (ref 150–400)
RBC: 4.32 MIL/uL (ref 3.87–5.11)
RDW: 13.7 % (ref 11.5–15.5)
WBC: 9.1 10*3/uL (ref 4.0–10.5)

## 2014-08-31 LAB — SURGICAL PCR SCREEN
MRSA, PCR: NEGATIVE
STAPHYLOCOCCUS AUREUS: NEGATIVE

## 2014-08-31 SURGERY — PPM/BIV PPM GENERATOR CHANGEOUT
Anesthesia: LOCAL

## 2014-08-31 MED ORDER — FENTANYL CITRATE (PF) 100 MCG/2ML IJ SOLN
INTRAMUSCULAR | Status: AC
  Filled 2014-08-31: qty 4

## 2014-08-31 MED ORDER — SODIUM CHLORIDE 0.9 % IJ SOLN: 3.0000 mL | Freq: Two times a day (BID) | INTRAMUSCULAR | Status: DC

## 2014-08-31 MED ORDER — SODIUM CHLORIDE 0.9 % IJ SOLN: 3.0000 mL | INTRAMUSCULAR | Status: DC | PRN

## 2014-08-31 MED ORDER — LIDOCAINE HCL (PF) 1 % IJ SOLN
INTRAMUSCULAR | Status: DC | PRN
  Administered 2014-08-31: 30 mL via SUBCUTANEOUS

## 2014-08-31 MED ORDER — MIDAZOLAM HCL 5 MG/5ML IJ SOLN
INTRAMUSCULAR | Status: DC | PRN
  Administered 2014-08-31: 2 mg via INTRAVENOUS
  Administered 2014-08-31: 1 mg via INTRAVENOUS

## 2014-08-31 MED ORDER — CHLORHEXIDINE GLUCONATE 4 % EX LIQD
60.0000 mL | Freq: Once | CUTANEOUS | Status: DC
  Filled 2014-08-31: qty 60

## 2014-08-31 MED ORDER — GENTAMICIN SULFATE 40 MG/ML IJ SOLN: 80.0000 mg | INTRAMUSCULAR | Status: DC

## 2014-08-31 MED ORDER — MUPIROCIN 2 % EX OINT
TOPICAL_OINTMENT | CUTANEOUS | Status: AC
  Administered 2014-08-31: 1
  Filled 2014-08-31: qty 22

## 2014-08-31 MED ORDER — FENTANYL CITRATE (PF) 100 MCG/2ML IJ SOLN
INTRAMUSCULAR | Status: DC | PRN
  Administered 2014-08-31 (×2): 25 ug via INTRAVENOUS

## 2014-08-31 MED ORDER — MIDAZOLAM HCL 5 MG/5ML IJ SOLN
INTRAMUSCULAR | Status: AC
  Filled 2014-08-31: qty 25

## 2014-08-31 MED ORDER — SODIUM CHLORIDE 0.9 % IV SOLN: 250.0000 mL | INTRAVENOUS | Status: DC | PRN

## 2014-08-31 MED ORDER — CEFAZOLIN SODIUM-DEXTROSE 2-3 GM-% IV SOLR
INTRAVENOUS | Status: DC | PRN
  Administered 2014-08-31: 2 g via INTRAVENOUS

## 2014-08-31 MED ORDER — CEFAZOLIN SODIUM-DEXTROSE 2-3 GM-% IV SOLR
2.0000 g | INTRAVENOUS | Status: DC
Start: 2014-08-31 — End: 2014-08-31

## 2014-08-31 MED ORDER — LIDOCAINE HCL (PF) 1 % IJ SOLN
INTRAMUSCULAR | Status: AC
  Filled 2014-08-31: qty 30

## 2014-08-31 MED ORDER — CEFAZOLIN SODIUM-DEXTROSE 2-3 GM-% IV SOLR
INTRAVENOUS | Status: AC
  Filled 2014-08-31: qty 50

## 2014-08-31 MED ORDER — SODIUM CHLORIDE 0.9 % IR SOLN
Status: AC
  Filled 2014-08-31: qty 2

## 2014-08-31 MED ORDER — SODIUM CHLORIDE 0.9 % IV SOLN
INTRAVENOUS | Status: DC
  Administered 2014-08-31: 12:00:00 via INTRAVENOUS

## 2014-08-31 MED ORDER — ONDANSETRON HCL 4 MG/2ML IJ SOLN: 4.0000 mg | Freq: Four times a day (QID) | INTRAMUSCULAR | Status: DC | PRN

## 2014-08-31 MED ORDER — ACETAMINOPHEN 325 MG PO TABS: 325.0000 mg | ORAL_TABLET | ORAL | Status: DC | PRN

## 2014-08-31 SURGICAL SUPPLY — 5 items
CABLE SURGICAL S-101-97-12 (CABLE) ×2 IMPLANT
PACEMAKER ADAPTA DR ADDRL1 (Pacemaker) ×1 IMPLANT
PAD DEFIB LIFELINK (PAD) ×2 IMPLANT
PPM ADAPTA DR ADDRL1 (Pacemaker) ×2 IMPLANT
TRAY PACEMAKER INSERTION (CUSTOM PROCEDURE TRAY) ×2 IMPLANT

## 2014-08-31 NOTE — Op Note (Signed)
Procedure report  Procedure performed:  1. Dual chamber pacemaker generator changeout  2. Light sedation  Reason for procedure:  1. Device generator at elective replacement interval  2. Complete heart block Procedure performed by:  Sanda Klein, MD  Complications:  None  Estimated blood loss:  <5 mL  Medications administered during procedure:  Ancef 2 g intravenously,  lidocaine 1% 30 mL locally, fentanyl 50 mcg intravenously, Versed 3 mg intravenously Device details:   New Generator Medtronic Adapta  model number [id number], serial number [id number] Right atrial lead (chronic) Medtronic, model number [id number], serial [id number] (implanted 09/28/2005) Right ventricular lead (chronic)  Medtronic [id number], serial number [id number] (implanted 09/28/2005)  Explanted generator model number [id number], serial number  [id number] (implanted 09/28/2005)  Procedure details:  After the risks and benefits of the procedure were discussed the patient provided informed consent. She was brought to the cardiac catheter lab in the fasting state. The patient was prepped and draped in usual sterile fashion. Local anesthesia with 1% lidocaine was administered to to the left infraclavicular area. A 5-6cm horizontal incision was made parallel with and 2-3 cm caudal to the left clavicle, in the area of an old scar. An older scar was seen closer to the left clavicle. Using minimal electrocautery and mostly sharp and blunt dissection the prepectoral pocket was opened carefully to avoid injury to the loops of chronic leads. Extensive dissection was not necessary. The device was explanted. The pocket was carefully inspected for hemostasis and flushed with copious amounts of antibiotic solution.  The leads were disconnected from the old generator and testing of the lead parameters showed excellent values. The new generator was connected to the chronic leads, with appropriate pacing noted.   The entire system was then  carefully inserted in the pocket with care been taking that the leads and device assumed a comfortable position without pressure on the incision. Great care was taken that the leads be located deep to the generator. The pocket was then closed in layers using 2 layers of 2-0 Vicryl and cutaneous staples after which a sterile dressing was applied.   At the end of the procedure the following lead parameters were encountered:   Right atrial lead sensed P waves 1.4-2.0 mV, impedance 454 ohms, threshold 0.5 at 0.4 ms pulse width.  Right ventricular lead sensed R waves  None detected, impedance 671 ohms, threshold 0.75 at 0.4  Sanda Klein, MD, [facility] [phone] office [phone] pager

## 2014-08-31 NOTE — H&P (View-Only) (Signed)
Patient ID: Abigail Wallace, female   DOB: 05-19-1945, 69 y.o.   MRN: [id number]     Cardiology Office Note   Date:  08/26/2014   ID:  [address] [place], [place] 10-20-1945, MRN [id number]  PCP:  Abigail Lynch, MD  Cardiologist:   Abigail Klein, MD   Chief Complaint  Patient presents with  . PACER FOLLOW UP    Patient says that her lips, legs. and knees have been bothering her.      History of Present Illness: Abigail Wallace is a 69 y.o. female who presents for  Pacemaker at elective replacement indicator. Her device reached ERI on July 27. She has noticed some shortness of breath since then, after the device which to VVI mode. She was reprogrammed DDDR today. Historically she has greater than 80% atrial pacing and has 100% ventricular pacing due to complete heart block as well as sinus node dysfunction. She has a very slow idioventricular escape rhythm at about 30 bpm. She just had labs performed yesterday with her primary care physician and these are normal. We rechecked coagulation studies today in anticipation of a pacemaker change.  She has a history of schizophrenia with psychosis requiring hospitalization in 2012. Since then she has done well and lives independently. Her son, Abigail Wallace ,accompanies her to the visit today (as always) and lives close by.  She received a dual-chamber permanent pacemaker in 2007 for symptomatic AV block. Her device is a Vitatron T. 53 DR. Serial number [id number]. She has 100% ventricular pacing but does have an underlying ventricular rhythm at about 37 beats per minute. She also paces the atrium 80% of the time. The lead parameters are excellent. She has minimal coronary atherosclerosis by cardiac catheterization performed in 2007 and no evidence of insufficiency by nuclear perfusion testing in 2012. By echocardiography she has normal left ventricular size and systolic function and no major structural cardiac abnormalities.  Noncardiac history includes chronic back  pain, obesity and orthostatic hypotension. She smokes roughly 5 cigarettes a day and has been diagnosed with COPD, but I don't think this has been formally documented.  Past Medical History  Diagnosis Date  . Cystitis     Frequent episodes.   . Gout   . COPD (chronic obstructive pulmonary disease)   . GERD (gastroesophageal reflux disease)   . Mental disorder   . Psychosis   . Memory difficulties 12/02/2013  . Tardive dyskinesia 12/02/2013  . Hypertension   . Coronary artery disease     NON-CRITICAL    Past Surgical History  Procedure Laterality Date  . Pacemaker placement  09/28/2005    2/2 SSS?  Marland Kitchen Cholecystectomy    . Tubal ligation    . Persantine stress test  05/01/2010    EF 66%. Normal LV sys fx. Unchanged from previous studies.   . Cardiac catheterization  2007    Non-critical.   . Insert / replace / remove pacemaker  2007    [place]/SYMPTOMATIC HEART BLOCK  . Transthoracic echocardiogram  09/08/10    SEVERE CONCENTRIC HYPERTROPHY.LV FUNCTION WAS VIGOROUS.EF 65%-70%.VENTRICULAR SEPTUM-INCOORDINATE MOTION.LEFT ATRIUM-MILDLY DILATED.TRIVIAL TR.     Current Outpatient Prescriptions  Medication Sig Dispense Refill  . acetaminophen (TYLENOL) 500 MG tablet Take 2,000 mg by mouth at bedtime.     Marland Kitchen allopurinol (ZYLOPRIM) 100 MG tablet take 1 tablet by mouth once daily (Patient taking differently: Take 100 mg by mouth once daily) 90 tablet 1  . divalproex (DEPAKOTE ER) 500 MG 24 hr tablet take 1  tablet by mouth daily (Patient taking differently: Take 500 mg by mouth daily. ) 30 tablet 0  . fluconazole (DIFLUCAN) 150 MG tablet Take 1 tablet (150 mg total) by mouth once. (Patient not taking: Reported on 08/26/2014) 1 tablet 0  . hydrochlorothiazide (MICROZIDE) 12.5 MG capsule Take 1 capsule (12.5 mg total) by mouth daily. 30 capsule 6  . losartan (COZAAR) 100 MG tablet take 1/2 tablet by mouth once daily (Patient taking differently: Take 50 mg by mouth once daily) 90 tablet 0    . metoprolol (LOPRESSOR) 50 MG tablet take 1 tablet by mouth twice a day (Patient taking differently: Take 50 mg by mouth twice daily) 60 tablet 6  . nitroGLYCERIN (NITROSTAT) 0.4 MG SL tablet Place 1 tablet (0.4 mg total) under the tongue every 5 (five) minutes x 3 doses as needed. For chest 25 tablet 1  . OLANZapine (ZYPREXA) 10 MG tablet Take 10 mg by mouth at bedtime.  0  . QUEtiapine (SEROQUEL) 25 MG tablet Take 25 mg by mouth at bedtime.   0  . rosuvastatin (CRESTOR) 5 MG tablet Take 1 tablet (5 mg total) by mouth every evening. KEEP OV. 30 tablet 0  . naproxen sodium (ANAPROX) 220 MG tablet Take 440 mg by mouth at bedtime.     No current facility-administered medications for this visit.    Allergies:   Abilify; Aripiprazole; Benadryl; Cogentin; Haloperidol lactate; Latuda; Remeron; Codeine; and Latex    Social History:  The patient  reports that she has been smoking Cigarettes.  She has been smoking about 0.25 packs per day. She has never used smokeless tobacco. She reports that she does not drink alcohol or use illicit drugs.   Family History:  The patient's family history includes Diabetes type II in her son; Heart disease in her father and mother; Kidney disease in her father and mother; Stroke in her sister.    ROS:  Please see the history of present illness.    Otherwise, review of systems positive for none.   All other systems are reviewed and negative.    PHYSICAL EXAM: VS:  BP 144/72 mmHg  Pulse 62  Resp 16  Ht 5\' 5"  (1.651 m)  Wt 265 lb (120.203 kg)  BMI 44.10 kg/m2 , BMI Body mass index is 44.1 kg/(m^2).  General: Alert, oriented x3, no distress Head: no evidence of trauma, PERRL, EOMI, no exophtalmos or lid lag, no myxedema, no xanthelasma; normal ears, nose and oropharynx Neck: normal jugular venous pulsations and no hepatojugular reflux; brisk carotid pulses without delay and no carotid bruits Chest: clear to auscultation, no signs of consolidation by  percussion or palpation, normal fremitus, symmetrical and full respiratory excursions Cardiovascular: normal position and quality of the apical impulse, regular rhythm, normal first and second heart sounds, no  murmurs, rubs or gallops Abdomen: no tenderness or distention, no masses by palpation, no abnormal pulsatility or arterial bruits, normal bowel sounds, no hepatosplenomegaly Extremities: no clubbing, cyanosis or edema; 2+ radial, ulnar and brachial pulses bilaterally; 2+ right femoral, posterior tibial and dorsalis pedis pulses; 2+ left femoral, posterior tibial and dorsalis pedis pulses; no subclavian or femoral bruits Neurological: grossly nonfocal Psych: euthymic mood, full affect   EKG:  EKG is ordered today. The ekg ordered today demonstrates  AV sequential pacing   Recent Labs: 12/02/2013: TSH 2.870    Lipid Panel    Component Value Date/Time   CHOL 122 11/19/2010 0605   TRIG 73 11/19/2010 0605   HDL 45  11/19/2010 0605   CHOLHDL 2.7 11/19/2010 0605   VLDL 15 11/19/2010 0605   LDLCALC 62 11/19/2010 0605   LDLDIRECT 80 03/20/2012 1510      Wt Readings from Last 3 Encounters:  08/24/14 265 lb (120.203 kg)  06/15/14 267 lb (121.11 kg)  05/14/14 244 lb (110.678 kg)      ASSESSMENT AND PLAN:   Her lead parameters are excellent, plan to change the generator of her dual-chamber permanent pacemaker in the next few days.This procedure has been fully reviewed with the patient and written informed consent has been obtained.  I suspect her dyspnea is a sign of AV dyssynchrony following the change in pacemaker mode. If this is the case, her shortness of breath should promptly resolved after we reprogrammed her device dual chamber sequential pacing.     Current medicines are reviewed at length with the patient today.  The patient does not have concerns regarding medicines.  The following changes have been made:  no change  Labs/ tests ordered today include:  Orders  Placed This Encounter  Procedures  . APTT  . Protime-INR  . Implantable device check  . EKG 12-Lead  . PACEMAKER GENERATOR CHANGE     Patient Instructions  Your physician has recommended that you have a pacemaker battery change out Tuesday August 16th ([employer]) . This is done as an out-patient at [facility].  Your physician recommends that you return for lab work in: today at [facility].        Mikael Spray, MD  08/26/2014 12:46 PM    Abigail Klein, MD, [facility] [place] [facility] [phone] office [phone] pager

## 2014-08-31 NOTE — Interval H&P Note (Signed)
History and Physical Interval Note:  08/31/2014 12:02 PM  [facility]  has presented today for surgery, with the diagnosis of battery depletion, complete heart block  The various methods of treatment have been discussed with the patient and family. After consideration of risks, benefits and other options for treatment, the patient has consented to  Procedure(s): PPM Generator Changeout (N/A) as a surgical intervention .  The patient's history has been reviewed, patient examined, no change in status, stable for surgery.  I have reviewed the patient's chart and labs.  Questions were answered to the patient's satisfaction.     Abigail Wallace

## 2014-08-31 NOTE — Discharge Instructions (Signed)
Pacemaker Battery Change, Care After °Refer to this sheet in the next few weeks. These instructions provide you with information on caring for yourself after your procedure. Your health care provider may also give you more specific instructions. Your treatment has been planned according to current medical practices, but problems sometimes occur. Call your health care provider if you have any problems or questions after your procedure. °WHAT TO EXPECT AFTER THE PROCEDURE °After your procedure, it is typical to have the following sensations: °· Soreness at the pacemaker site. °HOME CARE INSTRUCTIONS  °· Keep the incision clean and dry. °· Unless advised otherwise, you may shower beginning 48 hours after your procedure. °· For the first week after the replacement, avoid stretching motions that pull at the incision site, and avoid heavy exercise with the arm that is on the same side as the incision. °· Take medicines only as directed by your health care provider. °· Keep all follow-up visits as directed by your health care provider. °SEEK MEDICAL CARE IF:  °· You have pain at the incision site that is not relieved by over-the-counter or prescription medicine. °· There is drainage or pus from the incision site. °· There is swelling larger than a lime at the incision site. °· You develop red streaking that extends above or below the incision site. °· You feel brief, intermittent palpitations, light-headedness, or any symptoms that you feel might be related to your heart. °SEEK IMMEDIATE MEDICAL CARE IF:  °· You experience chest pain that is different than the pain at the pacemaker site. °· You experience shortness of breath. °· You have palpitations or irregular heartbeat. °· You have light-headedness that does not go away quickly. °· You faint. °· You have pain that gets worse and is not relieved by medicine. °Document Released: 10/22/2012 Document Revised: 05/18/2013 Document Reviewed: 10/22/2012 °[employer]® Patient  Information ©2015 [employer]. This information is not intended to replace advice given to you by your health care provider. Make sure you discuss any questions you have with your health care provider. ° °

## 2014-09-01 ENCOUNTER — Encounter (HOSPITAL_COMMUNITY): Payer: Self-pay | Admitting: Cardiovascular Disease

## 2014-09-01 MED FILL — Gentamicin Sulfate Inj 40 MG/ML: INTRAMUSCULAR | Qty: 2 | Status: AC

## 2014-09-01 MED FILL — Sodium Chloride Irrigation Soln 0.9%: Qty: 500 | Status: AC

## 2014-09-02 ENCOUNTER — Encounter: Payer: Self-pay | Admitting: Cardiovascular Disease

## 2014-09-02 ENCOUNTER — Other Ambulatory Visit: Payer: Self-pay | Admitting: Cardiovascular Disease

## 2014-09-02 NOTE — Telephone Encounter (Signed)
°  1. Which medications need to be refilled? Crestor   2. Which pharmacy is medication to be sent to? [facility] on [address]   3. Do they need a 30 day or 90 day supply? 30  4. Would they like a call back once the medication has been sent to the pharmacy? Yes

## 2014-09-02 NOTE — Telephone Encounter (Signed)
Rx(s) sent to pharmacy electronically. Patient notified. 

## 2014-09-03 ENCOUNTER — Telehealth: Payer: Self-pay | Admitting: Cardiovascular Disease

## 2014-09-03 NOTE — Telephone Encounter (Signed)
Pt's son, Jori Moll, inquiring about carelink monitor. Practice transmission was received. [name] aware there's no current need to send a remote for approx 6 months unless an unforeseen need arises. [name] aware appt will be made 51mo after wound check w/ Dr. Sallyanne Kuster.

## 2014-09-03 NOTE — Telephone Encounter (Signed)
Follow up     Pt had a pacemaker put in on tues.  Son has several questions

## 2014-09-03 NOTE — Telephone Encounter (Signed)
He wants to know if she needs to check her pacemaker over the phone before she is seen next week. She had a new pacemaker put in on Tuesday of this week.

## 2014-09-08 ENCOUNTER — Ambulatory Visit (INDEPENDENT_AMBULATORY_CARE_PROVIDER_SITE_OTHER): Payer: Medicare Other | Admitting: *Deleted

## 2014-09-08 DIAGNOSIS — I495 Sick sinus syndrome: Secondary | ICD-10-CM

## 2014-09-08 DIAGNOSIS — I442 Atrioventricular block, complete: Secondary | ICD-10-CM | POA: Diagnosis not present

## 2014-09-08 DIAGNOSIS — Z950 Presence of cardiac pacemaker: Secondary | ICD-10-CM

## 2014-09-10 LAB — CUP PACEART INCLINIC DEVICE CHECK
Battery Impedance: 100 Ohm
Battery Voltage: 2.8 V
Brady Statistic AP VS Percent: 0 %
Brady Statistic AS VP Percent: 6 %
Lead Channel Impedance Value: 441 Ohm
Lead Channel Impedance Value: 672 Ohm
Lead Channel Pacing Threshold Amplitude: 0.5 V
Lead Channel Pacing Threshold Pulse Width: 0.4 ms
Lead Channel Pacing Threshold Pulse Width: 0.4 ms
Lead Channel Setting Pacing Amplitude: 2 V
Lead Channel Setting Pacing Amplitude: 2.5 V
Lead Channel Setting Sensing Sensitivity: 4 mV
MDC IDC MSMT BATTERY REMAINING LONGEVITY: 127 mo
MDC IDC MSMT LEADCHNL RA SENSING INTR AMPL: 2 mV
MDC IDC MSMT LEADCHNL RV PACING THRESHOLD AMPLITUDE: 0.75 V
MDC IDC SESS DTM: 20160824173312
MDC IDC SET LEADCHNL RV PACING PULSEWIDTH: 0.4 ms
MDC IDC STAT BRADY AP VP PERCENT: 94 %
MDC IDC STAT BRADY AS VS PERCENT: 0 %

## 2014-09-10 NOTE — Progress Notes (Signed)
Changeout wound check appointment. Staples removed. Wound without redness or edema. Incision edges approximated, wound well healed. Normal device function. Thresholds, sensing, and impedances consistent with implant measurements. Device programmed at chronic output settings. Histogram distribution appropriate for patient and level of activity. No mode switches or high ventricular rates noted. Changed paced/sensed AV delays from 270/234ms to 200/114ms due to Vp%. Patient educated about wound care, arm mobility, lifting restrictions. ROV w/ [facility] 12/14/14.

## 2014-09-16 ENCOUNTER — Telehealth: Payer: Self-pay | Admitting: Cardiovascular Disease

## 2014-09-16 ENCOUNTER — Encounter: Payer: Self-pay | Admitting: Cardiology

## 2014-09-16 NOTE — Telephone Encounter (Signed)
Spoke to patient - she had some device related questions I was unable to answer.  Had changeout of battery on 8/16 - concerned about the way the PM site looks. Also inquiring about PM wallet card. Additionally, wanted to know when next followup indicated.  Will route to device pool.

## 2014-09-16 NOTE — Telephone Encounter (Signed)
Please call,have some questions about her pacemaker.

## 2014-09-16 NOTE — Telephone Encounter (Signed)
Spoke to pt regarding her PPM site. Patient denies any heat, redness, or drainage coming from the area, but she states that she is concerned about it "poking up" as much as it is. I explained to her that it is not uncommon for the ppm site to appear to be slightly elevated shortly after implant. I explained to her that it may take up to a month before she notices a reduction in size. I asked her to call back if she notices that the area is getting larger or if be becomes red, hot, or starts to drain. Patient voiced understanding.  I also informed patient that her ppm wallet card would be coming in the mail sometime soon. Again, patient voiced understanding.

## 2014-09-21 ENCOUNTER — Ambulatory Visit: Payer: Medicare Other | Admitting: Neurology

## 2014-09-28 ENCOUNTER — Telehealth: Payer: Self-pay | Admitting: Cardiovascular Disease

## 2014-09-28 NOTE — Telephone Encounter (Signed)
Mrs. Abigail Wallace is calling because she is having pains to where the incision was made .  Also wants to know if Dr. Donivan Scull can call her some medication in for a kidney infection? Please call  Thanks

## 2014-09-28 NOTE — Telephone Encounter (Signed)
Spoke to [place]. Advised PCP f/u for flank pain, burning on urination. She also reports URI symptoms which I advised would be best handled by primary. She has PCP appt tomorrow.  W/ URI symptoms she is having coughing w/ assoc chest soreness - states pain over her PM incision site but unsure if related to that.  Will route to Dr. Sallyanne Kuster for any advice.

## 2014-09-28 NOTE — Telephone Encounter (Signed)
I doubt that this has anything to do with her pacemaker. She should see her PCP as planned.

## 2014-10-18 ENCOUNTER — Encounter: Payer: Self-pay | Admitting: Cardiovascular Disease

## 2014-11-22 ENCOUNTER — Telehealth: Payer: Self-pay | Admitting: Cardiovascular Disease

## 2014-11-22 NOTE — Telephone Encounter (Signed)
Pt says she needs a letter stating  what was done when she had the procedure at [facility] on 08-31-14 please.Please call.

## 2014-11-22 NOTE — Telephone Encounter (Addendum)
Pt requests paper explaining the procedure she had done in August Paramedic). She doesn't need documentation for insurance or work - this is for personal reference only. On conversing w/ patient, I would consider plain language educational materials appropriate - I will resource these and send to her.  She confirmed current address as correct.  She also states she never received the plastic card after the pacemaker procedure. Should she expect this?

## 2014-11-22 NOTE — Telephone Encounter (Signed)
Yes, she should have received the card by now. Can we please contact the device rep to investigate? MCr

## 2014-11-23 NOTE — Telephone Encounter (Signed)
Spoke to the registration department w/ [employer] about patient's ID card. Patient's correct address given to [employer] representative. [employer] representative to mail card.   Informed patient's son about this. Patient's son voiced understanding.

## 2014-12-14 ENCOUNTER — Encounter: Payer: Medicare Other | Admitting: Cardiovascular Disease

## 2014-12-23 ENCOUNTER — Ambulatory Visit: Payer: Self-pay | Admitting: Family Medicine

## 2014-12-31 ENCOUNTER — Telehealth: Payer: Self-pay | Admitting: Cardiovascular Disease

## 2014-12-31 ENCOUNTER — Ambulatory Visit: Payer: Self-pay | Admitting: Family Medicine

## 2014-12-31 NOTE — Telephone Encounter (Signed)
Spoke w/ pt and informed her that we did receive her remote transmission. Informed pt that device tech reviewed transmission and stated that it was normal. Pt verbalized understanding.

## 2014-12-31 NOTE — Telephone Encounter (Signed)
°  New Prob   Pt is calling to verify her transmission went through. Please call.

## 2015-01-02 ENCOUNTER — Other Ambulatory Visit: Payer: Self-pay | Admitting: Cardiovascular Disease

## 2015-01-04 NOTE — Telephone Encounter (Signed)
Rx(s) sent to pharmacy electronically.  

## 2015-01-16 ENCOUNTER — Other Ambulatory Visit: Payer: Self-pay | Admitting: Cardiovascular Disease

## 2015-01-18 NOTE — Telephone Encounter (Signed)
Rx request sent to pharmacy.  

## 2015-02-01 ENCOUNTER — Ambulatory Visit (INDEPENDENT_AMBULATORY_CARE_PROVIDER_SITE_OTHER): Payer: Medicare Other | Admitting: Cardiovascular Disease

## 2015-02-01 ENCOUNTER — Encounter: Payer: Self-pay | Admitting: Cardiovascular Disease

## 2015-02-01 VITALS — BP 156/86 | HR 72 | Ht 66.0 in | Wt 228.0 lb

## 2015-02-01 DIAGNOSIS — I495 Sick sinus syndrome: Secondary | ICD-10-CM

## 2015-02-01 DIAGNOSIS — I10 Essential (primary) hypertension: Secondary | ICD-10-CM

## 2015-02-01 DIAGNOSIS — I442 Atrioventricular block, complete: Secondary | ICD-10-CM

## 2015-02-01 DIAGNOSIS — Z950 Presence of cardiac pacemaker: Secondary | ICD-10-CM | POA: Diagnosis not present

## 2015-02-01 DIAGNOSIS — I5032 Chronic diastolic (congestive) heart failure: Secondary | ICD-10-CM

## 2015-02-01 MED ORDER — LOSARTAN POTASSIUM 100 MG PO TABS: 100.0000 mg | ORAL_TABLET | Freq: Every day | ORAL | Status: DC

## 2015-02-01 MED ORDER — HYDROCHLOROTHIAZIDE 12.5 MG PO CAPS: 25.0000 mg | ORAL_CAPSULE | Freq: Every day | ORAL | Status: DC

## 2015-02-01 NOTE — Patient Instructions (Signed)
Your physician has recommended you make the following change in your medication: INCREASE HCTZ TO 25 MG DAILY (2 - 12.5MG  CAPSULES DAILY)  Remote monitoring is used to monitor your Pacemaker or ICD from home. This monitoring reduces the number of office visits required to check your device to one time per year. It allows [place] to monitor the functioning of your device to ensure it is working properly. You are scheduled for a device check from home on May 03, 2015. You may send your transmission at any time that day. If you have a wireless device, the transmission will be sent automatically. After your physician reviews your transmission, you will receive a postcard with your next transmission date.   Dr. Sallyanne Kuster recommends that you schedule a follow-up appointment in: [place] ([place])

## 2015-02-01 NOTE — Progress Notes (Signed)
Patient ID: Abigail Wallace, female   DOB: [birth year], 70 y.o.   MRN: [id number]    Cardiology Office Note    Date:  02/02/2015   ID:  [address] [place], [place] 04-08-1945, MRN [id number]  PCP:  Georges Lynch, MD  Cardiologist:   Sanda Klein, MD   Chief Complaint  Patient presents with  . Follow-up      occassional chest pain, has shortness of breath, has edema, has pain or cramping in legs, has lightheadedness or dizziness    History of Present Illness:  Abigail Wallace is a 70 y.o. female with complete heart block and sinus node dysfunction who presents for a pacemaker check, roughly 6 months following a pacemaker generator change.  Over the last several weeks she has noticed worsening lower extremity edema and dizziness and her blood pressure has been consistently elevated at home. Occasionally systolic blood pressure of around 200 has been recorded, usually in the 170-180 range. She reports compliance with her antihypertensive medications. She is taking naproxen regularly for arthritic pain. She denies angina pectoris, syncope, palpitations. She has not had orthopnea. As always she is very sedentary, so it is hard to assess whether she has any dyspnea.  Her pacemaker is a dual-chamber Medtronic Adapta device implanted in August 2016 is a generator change out. She is pacemaker dependent due to complete heart block (she has an idioventricular escape rhythm around 30 bpm). She also has significant sinus bradycardia. Comparable to what she has shown historically she has 88% atrial pacing and has 100% ventricular pacing. No episodes of atrial fibrillation or other significant atrial tachycardia has been recorded. A single 8 beat run of nonsustained ventricular tachycardia has occurred in the last 6 months.  She initially received a dual-chamber permanent pacemaker in 2007 for symptomatic AV block. She has minimal coronary atherosclerosis by cardiac catheterization performed in 2007 and no evidence of  insufficiency by nuclear perfusion testing in 2012. By echocardiography she has normal left ventricular size and systolic function and no major structural cardiac abnormalities.  Noncardiac history includes chronic back pain, obesity and orthostatic hypotension. She smokes roughly 5 cigarettes a day and has been diagnosed with COPD, but I don't think this has been formally documented. There is also a mention of a diagnosis of obstructive sleep apnea in her chart. Use CPAP. She has a history of schizophrenia with psychosis requiring hospitalization in 2012. Since then, she has done well and lives independently. Her son, [name] ,accompanies her to the visit today (as always) and lives close by.    Past Medical History  Diagnosis Date  . Cystitis     Frequent episodes.   . Gout   . COPD (chronic obstructive pulmonary disease) ([place])   . GERD (gastroesophageal reflux disease)   . Mental disorder   . Psychosis   . Memory difficulties 12/02/2013  . Tardive dyskinesia 12/02/2013  . Hypertension   . Coronary artery disease     NON-CRITICAL    Past Surgical History  Procedure Laterality Date  . Pacemaker placement  09/28/2005    2/2 SSS?  Marland Kitchen Cholecystectomy    . Tubal ligation    . Persantine stress test  05/01/2010    EF 66%. Normal LV sys fx. Unchanged from previous studies.   . Cardiac catheterization  2007    Non-critical.   . Insert / replace / remove pacemaker  2007    [place]/SYMPTOMATIC HEART BLOCK  . Transthoracic echocardiogram  09/08/10    SEVERE CONCENTRIC HYPERTROPHY.LV  FUNCTION WAS VIGOROUS.EF 65%-70%.VENTRICULAR SEPTUM-INCOORDINATE MOTION.LEFT ATRIUM-MILDLY DILATED.TRIVIAL TR.  Marland Kitchen Ep implantable device N/A 08/31/2014    Procedure: PPM Generator Changeout;  Surgeon: Sanda Klein, MD;  Location: [facility];  Service: Cardiovascular;  Laterality: N/A;    Outpatient Prescriptions Prior to Visit  Medication Sig Dispense Refill  . acetaminophen (TYLENOL) 500 MG tablet  Take 2,000 mg by mouth at bedtime.     Marland Kitchen allopurinol (ZYLOPRIM) 100 MG tablet take 1 tablet by mouth once daily 90 tablet 1  . CRESTOR 5 MG tablet take 1 tablet by mouth every evening 30 tablet 11  . divalproex (DEPAKOTE ER) 500 MG 24 hr tablet take 1 tablet by mouth daily (Patient taking differently: Take 500 mg by mouth daily. ) 30 tablet 0  . metoprolol (LOPRESSOR) 50 MG tablet take 1 tablet by mouth twice a day 60 tablet 0  . naproxen sodium (ANAPROX) 220 MG tablet Take 440 mg by mouth at bedtime.    . nitroGLYCERIN (NITROSTAT) 0.4 MG SL tablet Place 1 tablet (0.4 mg total) under the tongue every 5 (five) minutes x 3 doses as needed. For chest 25 tablet 1  . OLANZapine (ZYPREXA) 10 MG tablet Take 10 mg by mouth at bedtime.  0  . QUEtiapine (SEROQUEL) 25 MG tablet Take 25 mg by mouth at bedtime.   0  . fluconazole (DIFLUCAN) 150 MG tablet Take 1 tablet (150 mg total) by mouth once. 1 tablet 0  . hydrochlorothiazide (MICROZIDE) 12.5 MG capsule Take 1 capsule (12.5 mg total) by mouth daily. 30 capsule 6  . losartan (COZAAR) 100 MG tablet take 1/2 tablet by mouth once daily (Patient taking differently: Take 50 mg by mouth once daily) 90 tablet 0   No facility-administered medications prior to visit.     Allergies:   Abilify; Aripiprazole; Benadryl; Cogentin; Haloperidol lactate; Latuda; Remeron; Clozapine; Haloperidol lactate; Codeine; and Latex   Social History   Social History  . Marital Status: Divorced    Spouse Name: N/A  . Number of Children: N/A  . Years of Education: N/A   Social History Main Topics  . Smoking status: Current Every Day Smoker -- 0.25 packs/day    Types: Cigarettes  . Smokeless tobacco: Never Used     Comment: electronic cigarettes  . Alcohol Use: No  . Drug Use: No  . Sexual Activity: Not Asked   Other Topics Concern  . None   Social History Narrative   Lives alone. Older sister lives nearby and helps take her to appointments and to take care of her;  sister uses wheelchair, unsure why (thin and appears healthy).    Married.   Children: 2 daughters, 1 son; 49 GC, 5 GGC.    Occupation: disabled.    Caffeine:   Tobacco: 5-6/day   Denies alcohol.      Family History:  The patient's family history includes Diabetes type II in her son; Heart disease in her father and mother; Kidney disease in her father and mother; Stroke in her sister.   ROS:   Please see the history of present illness.    Review of Systems  Constitution: Negative for chills, decreased appetite and fever.  HENT: Negative for headaches and nosebleeds.   Eyes: Negative for visual disturbance.  Cardiovascular: Positive for leg swelling. Negative for chest pain, irregular heartbeat, orthopnea, palpitations and syncope.  Respiratory: Positive for snoring. Negative for cough, hemoptysis and wheezing.   Endocrine: Negative for cold intolerance, heat intolerance, polydipsia, polyphagia and  polyuria.  Hematologic/Lymphatic: Does not bruise/bleed easily.  Skin: Negative for itching and rash.  Musculoskeletal: Positive for arthritis, back pain, joint pain and neck pain.  Gastrointestinal: Negative for abdominal pain, change in bowel habit, hematemesis, melena, nausea and vomiting.  Genitourinary: Negative for frequency, hematuria, non-menstrual bleeding and urgency.  Neurological: Positive for difficulty with concentration and tremors.  Psychiatric/Behavioral: Negative for depression and hallucinations.  Allergic/Immunologic: Negative.    All other systems reviewed and are negative.   PHYSICAL EXAM:   VS:  BP 156/86 mmHg  Pulse 72  Ht 5\' 6"  (1.676 m)  Wt 228 lb (103.42 kg)  BMI 36.82 kg/m2   GEN: Well nourished, well developed, in no acute distress HEENT: normal Neck: no JVD, carotid bruits, or masses Cardiac: RRR; paradoxically split second heart sound; no murmurs, rubs, or gallops, mild ankle edema, right greater than left ; healthy pacemaker site in the left  subclavian area Respiratory:  clear to auscultation bilaterally, normal work of breathing GI: soft, nontender, nondistended, + BS MS: no deformity or atrophy Skin: warm and dry, no rash Neuro:  Alert and Oriented x 3, Strength and sensation are intact. Prominent tremor and tardive dyskinesia  Psych: euthymic mood, full affect  Wt Readings from Last 3 Encounters:  02/01/15 228 lb (103.42 kg)  08/31/14 260 lb (117.935 kg)  08/24/14 265 lb (120.203 kg)      Studies/Labs Reviewed:   EKG:  EKG is ordered today.  The ekg ordered today demonstrates AV sequential pacing  Recent Labs: 08/31/2014: BUN 20; Creatinine, Ser 0.99; Hemoglobin 12.5; Platelets 224; Potassium 4.3; Sodium 135   Lipid Panel    Component Value Date/Time   CHOL 122 11/19/2010 0605   TRIG 73 11/19/2010 0605   HDL 45 11/19/2010 0605   CHOLHDL 2.7 11/19/2010 0605   VLDL 15 11/19/2010 0605   LDLCALC 62 11/19/2010 0605   LDLDIRECT 80 03/20/2012 1510     ASSESSMENT:    1. Chronic diastolic heart failure ([place])   2. Essential hypertension   3. SSS (sick sinus syndrome) ([place])   4. CHB (complete heart block) ([facility])   5. Pacemaker      PLAN:  In order of problems listed above:  1. CHF: I'm not convinced that she has an episode of heart failure exacerbation. The degree of edema is not impressive although subjectively she describes it as a lot. There is no evidence of pulmonary congestion on exam. Her weight is a she substantially lower than it was just a few months ago. Will increase her thiazide diuretic. 2. HTN: Have asked her son to check her blood pressure at home and send [place] some records, understanding that I will take 2 weeks for the full effect of the diuretic dose increase. If the blood pressure remains high, consider switching from metoprolol to carvedilol, switching to a more potent angiotensin receptor blocker or adding amlodipine. Have asked her to try to curtail the daily use of nonsteroidal  anti-inflammatory drugs which raise her blood pressure and worsened sodium retention. Also focus on restricted sodium in her diet. 3. SSS: She is quite sedentary. Satisfactory heart rate histogram distribution with the current pacemaker sensor settings. 4. CHB: She has a very slow idioventricular escape rhythm. She should be considered pacemaker dependent. 5. PPM: Normal pacemaker function. Care[facility] downloads every 3 months and office visit in 6 months..     Medication Adjustments/Labs and Tests Ordered: Current medicines are reviewed at length with the patient today.  Concerns regarding medicines are  outlined above.  Medication changes, Labs and Tests ordered today are listed in the Patient Instructions below. Patient Instructions  Your physician has recommended you make the following change in your medication: INCREASE HCTZ TO 25 MG DAILY (2 - 12.5MG  CAPSULES DAILY)  Remote monitoring is used to monitor your Pacemaker or ICD from home. This monitoring reduces the number of office visits required to check your device to one time per year. It allows [place] to monitor the functioning of your device to ensure it is working properly. You are scheduled for a device check from home on May 03, 2015. You may send your transmission at any time that day. If you have a wireless device, the transmission will be sent automatically. After your physician reviews your transmission, you will receive a postcard with your next transmission date.   Dr. Sallyanne Kuster recommends that you schedule a follow-up appointment in: [place] ([place])         Mikael Spray, MD  02/02/2015 9:21 AM    [address], [place], [place]  [address] Phone: [phone]; Fax: [phone]

## 2015-02-08 ENCOUNTER — Telehealth: Payer: Self-pay | Admitting: *Deleted

## 2015-02-08 NOTE — Telephone Encounter (Signed)
Called patient to offer flu vaccine.  Patient declined.  Stated, "Last one almost killed me." DUCATTE, Orvis Brill, RN

## 2015-02-16 ENCOUNTER — Ambulatory Visit: Payer: Medicare Other | Admitting: Neurology

## 2015-02-16 ENCOUNTER — Other Ambulatory Visit: Payer: Self-pay | Admitting: Cardiovascular Disease

## 2015-02-17 NOTE — Telephone Encounter (Signed)
Rx request sent to pharmacy.  

## 2015-02-22 ENCOUNTER — Telehealth: Payer: Self-pay | Admitting: Family Medicine

## 2015-02-22 ENCOUNTER — Ambulatory Visit (INDEPENDENT_AMBULATORY_CARE_PROVIDER_SITE_OTHER): Payer: Medicare Other | Admitting: Family Medicine

## 2015-02-22 ENCOUNTER — Encounter: Payer: Self-pay | Admitting: Family Medicine

## 2015-02-22 VITALS — BP 140/77 | HR 80 | Temp 98.7°F

## 2015-02-22 DIAGNOSIS — N938 Other specified abnormal uterine and vaginal bleeding: Secondary | ICD-10-CM | POA: Diagnosis not present

## 2015-02-22 DIAGNOSIS — R300 Dysuria: Secondary | ICD-10-CM | POA: Diagnosis not present

## 2015-02-22 DIAGNOSIS — I10 Essential (primary) hypertension: Secondary | ICD-10-CM | POA: Diagnosis not present

## 2015-02-22 LAB — POCT UA - MICROSCOPIC ONLY

## 2015-02-22 LAB — POCT URINALYSIS DIPSTICK
Bilirubin, UA: NEGATIVE
GLUCOSE UA: NEGATIVE
KETONES UA: NEGATIVE
Leukocytes, UA: NEGATIVE
Nitrite, UA: NEGATIVE
PROTEIN UA: 100
Spec Grav, UA: 1.02
UROBILINOGEN UA: 0.2
pH, UA: 5.5

## 2015-02-22 MED ORDER — SULFAMETHOXAZOLE-TRIMETHOPRIM 800-160 MG PO TABS: 1.0000 | ORAL_TABLET | Freq: Two times a day (BID) | ORAL | Status: DC

## 2015-02-22 NOTE — Patient Instructions (Signed)
It was a pleasure seeing you today in our clinic. Today we discussed your UTI and vaginal bleeding. Here is the treatment plan we have discussed and agreed upon together:   - I have placed a prescription for Bactrim. Take this twice a day for the next 3 days. - I also placed a referral to gynecology. You'll be contacted to schedule an appointment with their office. - Try her best to stay well-hydrated with water.  - Do not hesitate to contact our office if you have any questions or concerns.

## 2015-02-22 NOTE — Progress Notes (Signed)
DYSURIA: Pain w/ urinating started 3 weeks ago. Pain is: "feels like someone is pulling on things in there" Medications tried: none Any antibiotics in the last 30 days: no More than 3 UTIs in the last 12 months: no STD exposure: no Possibly pregnant: no  Symptoms Urgency: yes Frequency: yes Blood in urine: lysed Pain in back:yes Fever: no Vaginal discharge: no Mouth Ulcers: no   HTN: Patient endorses home blood pressure reads in the hypertensive range. She states she has been very compliant with her medication regimen. Recent visit to cardiology brought some changes to her current antihypertensive medicines.   DYSFUNCTIONAL UTERINE BLEEDING: At the very end of our visit patient mentioned that she had noticed some bloody vaginal discharge recently. She states that this is happened off-and-on for the past month or so. She denies any significant bleeding but states that there is some trace blood at times. Patient would like to see a GYN specialist.   Review of Symptoms - see HPI PMH - Smoking status noted.    Objective: BP 140/77 mmHg  Pulse 80  Temp(Src) 98.7 F (37.1 C) (Oral) Gen: NAD, alert, cooperative, and pleasant. Tardive dyskinesia present HEENT: NCAT, EOMI, PERRL, no JVD, no LAD CV: RRR, no murmur, pacemaker site noted on chest Resp: CTAB, no wheezes, non-labored Abd: SNTND, BS present, no guarding or organomegaly, no CVA tenderness, no suprapubic tenderness.  Assessment and plan:  HTN (hypertension) Improving: Patient had reported hypertensive blood pressures while at home. She endorsed good compliance with her medication regimen. She was recently seen by cardiology who had made some changes to her antihypertensive medications. In his note he clearly states that she had been informed to wait 2 weeks for these medications to take effect. It has been approximately 2 weeks since that visit. Blood pressure today looked encouraging with a blood pressure of 140/77 and a  pulse of 80. Patient states that she has not contacted her cardiologist about the concerns of her blood pressure. I informed her that if her blood pressure is to make any increases from the values obtained today and she should contact her cardiologist. - With a borderline hypertensive blood pressure today I do not feel as though any changes to this current medication regimen is warranted, especially when considering patient's age of 51.  Dysuria Patient is endorsing some complaints of dysuria. Urine dipstick and microscopic exam was unremarkable. Patient did however complain of some waxing and waning symptoms of feeling flushed or feverish. No evidence of systemic symptoms at this time. Patient is currently afebrile. Typically I would not treat patient with antibiotics at this time. However using clinical judgment with the history of these symptoms I feels though treatment with three-day course of Bactrim for mild cystitis is warranted. - Bactrim 3 days. Twice a day  DUB (dysfunctional uterine bleeding) Patient reported recent mild vaginal bleeding. She states that this began approximately one month ago. Bleeding is "not very much". Due to patient's age reports of these symptoms are very concerning. No CBC warranted at this time due to the small amount of blood reported by patient and the asymptomatic (for anemia) status at this time. - Referral to gynecology has been placed.    Orders Placed This Encounter  Procedures  . Ambulatory referral to Gynecology    Referral Priority:  Routine    Referral Type:  Consultation    Referral Reason:  Specialty Services Required    Requested Specialty:  Gynecology    Number of Visits Requested:  1  . POCT urinalysis dipstick  . POCT UA - Microscopic Only    Meds ordered this encounter  Medications  . sulfamethoxazole-trimethoprim (BACTRIM DS,SEPTRA DS) 800-160 MG tablet    Sig: Take 1 tablet by mouth 2 (two) times daily.    Dispense:  6 tablet     Refill:  0     Elberta Leatherwood, MD,MS,  PGY2 02/22/2015 8:16 PM

## 2015-02-22 NOTE — Assessment & Plan Note (Addendum)
Patient reported recent mild vaginal bleeding. She states that this began approximately one month ago. Bleeding is "not very much". Due to patient's age reports of these symptoms are very concerning. No CBC warranted at this time due to the small amount of blood reported by patient and the asymptomatic (for anemia) status at this time. - Referral to gynecology has been placed.

## 2015-02-22 NOTE — Telephone Encounter (Signed)
Attempted to call patient 3 separate times. All attempts yielded a busy signal. We'll try again tomorrow.

## 2015-02-22 NOTE — Telephone Encounter (Signed)
Son called back upset because the swelling wasn't addressed.  He is upset because this is a a [employer] program and he is feels she is not receiving quality care in this facility.  All of his complaints were my fault Please have a nurse call him

## 2015-02-22 NOTE — Telephone Encounter (Signed)
Son has questions for dr at todays visit: Please talk to pt about swelling in left extremities and her blood pressure.   She now take Seraquel and her losartan was upped.

## 2015-02-22 NOTE — Assessment & Plan Note (Signed)
Patient is endorsing some complaints of dysuria. Urine dipstick and microscopic exam was unremarkable. Patient did however complain of some waxing and waning symptoms of feeling flushed or feverish. No evidence of systemic symptoms at this time. Patient is currently afebrile. Typically I would not treat patient with antibiotics at this time. However using clinical judgment with the history of these symptoms I feels though treatment with three-day course of Bactrim for mild cystitis is warranted. - Bactrim 3 days. Twice a day

## 2015-02-22 NOTE — Assessment & Plan Note (Signed)
Improving: Patient had reported hypertensive blood pressures while at home. She endorsed good compliance with her medication regimen. She was recently seen by cardiology who had made some changes to her antihypertensive medications. In his note he clearly states that she had been informed to wait 2 weeks for these medications to take effect. It has been approximately 2 weeks since that visit. Blood pressure today looked encouraging with a blood pressure of 140/77 and a pulse of 80. Patient states that she has not contacted her cardiologist about the concerns of her blood pressure. I informed her that if her blood pressure is to make any increases from the values obtained today and she should contact her cardiologist. - With a borderline hypertensive blood pressure today I do not feel as though any changes to this current medication regimen is warranted, especially when considering patient's age of 4.

## 2015-02-23 NOTE — Telephone Encounter (Signed)
Called patient. Got a voicemail. Left message stating that I would be available until riding to answer any questions. After that time questions could be answered by my colleagues until I return March 1.

## 2015-03-30 ENCOUNTER — Telehealth: Payer: Self-pay | Admitting: Family Medicine

## 2015-04-08 ENCOUNTER — Other Ambulatory Visit: Payer: Self-pay | Admitting: *Deleted

## 2015-04-08 MED ORDER — NITROGLYCERIN 0.4 MG SL SUBL: 0.4000 mg | SUBLINGUAL_TABLET | SUBLINGUAL | Status: DC | PRN

## 2015-04-08 NOTE — Telephone Encounter (Signed)
Follow up   Pt son calling for follow up   On his request on medications

## 2015-04-15 ENCOUNTER — Ambulatory Visit (INDEPENDENT_AMBULATORY_CARE_PROVIDER_SITE_OTHER): Payer: Medicare Other | Admitting: Family Medicine

## 2015-04-15 ENCOUNTER — Encounter: Payer: Self-pay | Admitting: Family Medicine

## 2015-04-15 VITALS — BP 117/101 | HR 94 | Temp 98.6°F | Ht 66.0 in | Wt 285.0 lb

## 2015-04-15 DIAGNOSIS — R0600 Dyspnea, unspecified: Secondary | ICD-10-CM | POA: Insufficient documentation

## 2015-04-15 DIAGNOSIS — K429 Umbilical hernia without obstruction or gangrene: Secondary | ICD-10-CM | POA: Diagnosis not present

## 2015-04-15 DIAGNOSIS — R609 Edema, unspecified: Secondary | ICD-10-CM

## 2015-04-15 DIAGNOSIS — I5032 Chronic diastolic (congestive) heart failure: Secondary | ICD-10-CM

## 2015-04-15 MED ORDER — FUROSEMIDE 20 MG PO TABS: 20.0000 mg | ORAL_TABLET | Freq: Every day | ORAL | Status: DC

## 2015-04-15 NOTE — Progress Notes (Signed)
HPI  CC: Worsening shortness of breath and hernia Patient is here with complaints of worsening shortness of breath and peripheral edema over the past 6 months. She states that she has significant shortness of breath when she lays flat as well as with prolonged ambulation. She states that she does not trust herself on her feet and feels as though she may fall often. She denies any chest pain. Her peripheral edema is significantly worse and had been in the past. Her son states that he is constantly reminding her to prop her feet up and since he is started doing this this edema has been significantly improved. She states that she is extremely compliant with her medications ever since her son has begun helping her with these. She also urinates frequently which she attests to the thiazide diuretic she is taking.   Of note: Patient has a history of tobacco use, she is also seen by cardiology and is completely paced by a pacemaker. Browsing through her records I am unable to find any evidence of an echocardiogram.   Patient is also complaining of a growing umbilical hernia. According to her son this was found 3-4 years ago and they were told that it did not have to be repaired. Unfortunately, since that time this hernia has grown in size. She denies any abdominal pain or tenderness to this area however she states it is very uncomfortable and occasionally feels as though it worsens or shortness of breath.  Review of Systems   See HPI for ROS. All other systems reviewed and are negative.  CC, SH/smoking status, and VS noted  Objective: BP 117/101 mmHg  Pulse 94  Temp(Src) 98.6 F (37 C) (Oral)  Ht 5\' 6"  (1.676 m)  Wt 285 lb (129.275 kg)  BMI 46.02 kg/m2  SpO2 97% Gen: NAD, alert, cooperative, and pleasant. HEENT: NCAT, EOMI, PERRL, dentition poor, MMM, no JVD appreciated CV: RRR, no murmur Resp: CTAB, no wheezes, non-labored, no crackles Abd: Morbidly obese, SNTND, BS present, no guarding or  organomegaly, central mass noted to palpation, difficult to fully assess due to obesity. Ext: Warm, peripheral pulses intact throughout, +2 pitting edema to the tibial tuberosity bilaterally. Neuro: Alert and oriented, Speech clear, obvious tardive dyskinesia  Assessment and plan:  Dyspnea Patient is here with complaints of worsening dyspnea and peripheral edema over the past 6 months. Etiology currently unknown however this is likely secondary to CHF. According to our records patient has gained a substantial amount of weight over the past 2 years. Patient has a significant cardiac history and is completely paced by her pacemaker. There is no available records of a recent echocardiogram. - Echocardiogram ordered - Lasix 20 mg daily over the next 2 weeks. - Hold HCTZ while on Lasix. - Labs: BNP, CMP, TSH, CBC. - I would like to see her back in approximately 2 weeks to reassess fluid status, labs, shortness of breath.  LEG EDEMA, BILATERAL Worsening: Likely secondary to the cause of dyspnea. - see A/P above  Umbilical hernia Patient is complaining of worsening umbilical hernia. Hernia was noted on CT back and 2013. She denies any significant abdominal pain with this however she does state that it is slightly tender and seems to occasionally worsen her shortness of breath. Patient and her son are both interested in possible surgical correction. - Referral to general surgery.    Orders Placed This Encounter  Procedures  . Brain natriuretic peptide  . COMPLETE METABOLIC PANEL WITH GFR  . TSH  .  CBC  . Ambulatory referral to Home Health    Referral Priority:  Routine    Referral Type:  Home Health Care    Referral Reason:  Specialty Services Required    Requested Specialty:  [place]    Number of Visits Requested:  1  . Ambulatory referral to General Surgery    Referral Priority:  Routine    Referral Type:  Surgical    Referral Reason:  Specialty Services Required     Requested Specialty:  General Surgery    Number of Visits Requested:  1  . Echocardiogram    Standing Status: Future     Number of Occurrences:      Standing Expiration Date: 07/14/2016    Order Specific Question:  Where should this test be performed    Answer:  [facility])    Order Specific Question:  Complete or Limited study?    Answer:  Complete    Order Specific Question:  With Image Enhancing Agent or without Image Enhancing Agent?    Answer:  With Image Enhancing Agent    Order Specific Question:  Expected Date:    Answer:  1 week    Order Specific Question:  Reason for exam-Echo    Answer:  Dyspnea  786.09 / R06.00    Meds ordered this encounter  Medications  . furosemide (LASIX) 20 MG tablet    Sig: Take 1 tablet (20 mg total) by mouth daily.    Dispense:  15 tablet    Refill:  0     Elberta Leatherwood, MD,MS,  PGY2 04/15/2015 6:37 PM

## 2015-04-15 NOTE — Patient Instructions (Signed)
It was a pleasure seeing you today in our clinic. Today we discussed your shortness of breath. Here is the treatment plan we have discussed and agreed upon together:   - Take 1 tablet of the Lasix every morning. If you notice he do not have significant urine output during that day he may take 2. - Do not take your hydrochlorothiazide while on this medication. - I would like to see you back on 04/27/15. After that visit he will likely want to restart your hydrochlorothiazide. - We have scheduled the echocardiogram.

## 2015-04-15 NOTE — Assessment & Plan Note (Signed)
Worsening: Likely secondary to the cause of dyspnea. - see A/P above

## 2015-04-15 NOTE — Assessment & Plan Note (Signed)
Patient is complaining of worsening umbilical hernia. Hernia was noted on CT back and 2013. She denies any significant abdominal pain with this however she does state that it is slightly tender and seems to occasionally worsen her shortness of breath. Patient and her son are both interested in possible surgical correction. - Referral to general surgery.

## 2015-04-15 NOTE — Assessment & Plan Note (Signed)
Patient is here with complaints of worsening dyspnea and peripheral edema over the past 6 months. Etiology currently unknown however this is likely secondary to CHF. According to our records patient has gained a substantial amount of weight over the past 2 years. Patient has a significant cardiac history and is completely paced by her pacemaker. There is no available records of a recent echocardiogram. - Echocardiogram ordered - Lasix 20 mg daily over the next 2 weeks. - Hold HCTZ while on Lasix. - Labs: BNP, CMP, TSH, CBC. - I would like to see her back in approximately 2 weeks to reassess fluid status, labs, shortness of breath.

## 2015-04-16 LAB — CBC
HCT: 39.1 % (ref 36.0–46.0)
HEMOGLOBIN: 13 g/dL (ref 12.0–15.0)
MCH: 28.3 pg (ref 26.0–34.0)
MCHC: 33.2 g/dL (ref 30.0–36.0)
MCV: 85 fL (ref 78.0–100.0)
MPV: 9 fL (ref 8.6–12.4)
Platelets: 286 10*3/uL (ref 150–400)
RBC: 4.6 MIL/uL (ref 3.87–5.11)
RDW: 14.6 % (ref 11.5–15.5)
WBC: 11.2 10*3/uL — AB (ref 4.0–10.5)

## 2015-04-16 LAB — COMPLETE METABOLIC PANEL WITH GFR
ALT: 11 U/L (ref 6–29)
AST: 15 U/L (ref 10–35)
Albumin: 3.9 g/dL (ref 3.6–5.1)
Alkaline Phosphatase: 63 U/L (ref 33–130)
BUN: 22 mg/dL (ref 7–25)
CALCIUM: 10 mg/dL (ref 8.6–10.4)
CHLORIDE: 99 mmol/L (ref 98–110)
CO2: 24 mmol/L (ref 20–31)
Creat: 1.03 mg/dL — ABNORMAL HIGH (ref 0.60–0.93)
GFR, EST AFRICAN AMERICAN: 63 mL/min (ref >=60)
GFR, EST NON AFRICAN AMERICAN: 54 mL/min — AB (ref >=60)
Glucose, Bld: 99 mg/dL (ref 65–99)
POTASSIUM: 4.2 mmol/L (ref 3.5–5.3)
Sodium: 137 mmol/L (ref 135–146)
TOTAL PROTEIN: 6.9 g/dL (ref 6.1–8.1)
Total Bilirubin: 0.3 mg/dL (ref 0.2–1.2)

## 2015-04-16 LAB — TSH: TSH: 5.27 m[IU]/L — AB

## 2015-04-16 LAB — BRAIN NATRIURETIC PEPTIDE: BRAIN NATRIURETIC PEPTIDE: 65.2 pg/mL (ref <100)

## 2015-04-20 ENCOUNTER — Telehealth: Payer: Self-pay | Admitting: Family Medicine

## 2015-04-20 NOTE — Telephone Encounter (Signed)
Pt called and her foot is swollen and red. She said that it is warm to the touch. She is wanting to know what she should put on this or does the doctor have to call in something for this.jw

## 2015-04-21 LAB — CUP PACEART INCLINIC DEVICE CHECK
Battery Remaining Longevity: 131 mo
Brady Statistic AP VP Percent: 89 %
Brady Statistic AS VS Percent: 0 %
Implantable Lead Implant Date: 20070414
Implantable Lead Implant Date: 20070914
Implantable Lead Location: 753859
Implantable Lead Model: 5594
Lead Channel Setting Pacing Amplitude: 2.5 V
Lead Channel Setting Sensing Sensitivity: 4 mV
MDC IDC LEAD LOCATION: 753860
MDC IDC MSMT BATTERY IMPEDANCE: 100 Ohm
MDC IDC MSMT BATTERY VOLTAGE: 2.79 V
MDC IDC MSMT LEADCHNL RA IMPEDANCE VALUE: 557 Ohm
MDC IDC MSMT LEADCHNL RV IMPEDANCE VALUE: 737 Ohm
MDC IDC SESS DTM: 20170117150638
MDC IDC SET LEADCHNL RA PACING AMPLITUDE: 2 V
MDC IDC SET LEADCHNL RV PACING PULSEWIDTH: 0.4 ms
MDC IDC STAT BRADY AP VS PERCENT: 0 %
MDC IDC STAT BRADY AS VP PERCENT: 11 %

## 2015-04-21 NOTE — Telephone Encounter (Signed)
Pt informed. She stated that she didn't have a way to come in and that she will try to get someone to take her to the ED today or tomorrow. i told her that she need to be seen as soon as possible, then her son got on the phone yelling "why didn't they handle this when they were in the office". He had no idea she called [place] until i told him. He apologized. Pt got back on the phone and said she will find a ride to the ED today or tomorrow. Vylette Strubel Kennon Holter, CMA

## 2015-04-21 NOTE — Telephone Encounter (Signed)
Patient needs to be seen by a physician as soon as possible to make sure she does not have an infection, gout flare, or something else going on.  Algis Greenhouse. Jerline Pain, [facility] Resident PGY-2 04/21/2015 9:03 AM

## 2015-04-27 ENCOUNTER — Ambulatory Visit (INDEPENDENT_AMBULATORY_CARE_PROVIDER_SITE_OTHER): Payer: Medicare Other | Admitting: Family Medicine

## 2015-04-27 VITALS — BP 172/87 | HR 102 | Temp 98.4°F | Wt 291.2 lb

## 2015-04-27 DIAGNOSIS — R601 Generalized edema: Secondary | ICD-10-CM | POA: Diagnosis not present

## 2015-04-27 DIAGNOSIS — E039 Hypothyroidism, unspecified: Secondary | ICD-10-CM

## 2015-04-27 DIAGNOSIS — R0602 Shortness of breath: Secondary | ICD-10-CM | POA: Diagnosis not present

## 2015-04-27 DIAGNOSIS — R0600 Dyspnea, unspecified: Secondary | ICD-10-CM | POA: Diagnosis not present

## 2015-04-27 DIAGNOSIS — I5032 Chronic diastolic (congestive) heart failure: Secondary | ICD-10-CM

## 2015-04-27 DIAGNOSIS — E038 Other specified hypothyroidism: Secondary | ICD-10-CM | POA: Insufficient documentation

## 2015-04-27 MED ORDER — FUROSEMIDE 20 MG PO TABS: 40.0000 mg | ORAL_TABLET | Freq: Every day | ORAL | Status: DC

## 2015-04-27 NOTE — Assessment & Plan Note (Addendum)
TSH 5.27 Will need Free T4 and repeat TSH at next lab draw. (future orders placed) Will need to initiate Synthroid shortly thereafter.

## 2015-04-27 NOTE — Assessment & Plan Note (Addendum)
Patient is here with continued shortness of breath. She was seen in the office less than 2 weeks ago with this similar complaint. It was noted that her weight was significantly elevated and the determination of fluid overload was made. At that time she was placed on oral Lasix and asked to follow-up in 1-2 weeks. Patient's weight continues to rise and is over 5 pounds higher than she was 2 weeks ago. - I've asked patient to restart her HCTZ - I have increased the Lasix dose from 20 daily to 40 daily for the next 5 days - Echocardiogram have been ordered at the previous visit; this is scheduled for next week - I've asked patient to get in touch with her cardiologist and asked to be seen sometime next week. - If patient is unable to get in with her cardiologist I've asked that she follow-up next week with me, and if she is unable to get an appointment with me then I would like to have her stop been to obtain a BMP. - We discussed significant red flag symptoms in which she should report immediately to the ED. This time I am very close to sending her there myself however because she has the watchful eye of her son close at hand I feel fairly comfortable continuing outpatient care for the time being.  Precepted with Dr. Gwendlyn Deutscher

## 2015-04-27 NOTE — Patient Instructions (Signed)
It was a pleasure seeing you today in our clinic. Today we discussed your dyspnea and fluid status. Here is the treatment plan we have discussed and agreed upon together:   - I would like you to increase your temporary lasix dosing. Start taking 2 tablets daily until your bottles are empty. - restart your hydrochlorothiazide.  - Schedule an appt with your cardiologist.  - Come back in 1 week to have blood drawn if you are unable to set up appt w/ cardiologist in near future.

## 2015-04-27 NOTE — Assessment & Plan Note (Signed)
See plan above.

## 2015-04-27 NOTE — Addendum Note (Signed)
Addended byGeorges Lynch D on: 04/27/2015 07:41 PM   Modules accepted: Orders

## 2015-04-27 NOTE — Progress Notes (Addendum)
HPI  CC: Follow-up on shortness of breath Patient presents today for a follow-up on her shortness of breath. At her previous visit she had been prescribed 20 mg of daily Lasix for 2 weeks. At that time she was asked to hold her HCTZ and asked to follow-up in 1-2 weeks. Today she has notably gained over 5 pounds of weight. She endorses good compliance with her medications and her son is present and able to assure me of this compliance. Previous labs had been relatively benign with the exception of an elevated TSH. She denies any worsening shortness of breath or chest pain. She denies any headaches, confusion, blurred vision, or dizziness. She reports very good urine output and normal fluid intake.  Review of Systems   See HPI for ROS. All other systems reviewed and are negative.  CC, SH/smoking status, and VS noted  Objective: BP 172/87 mmHg  Pulse 102  Temp(Src) 98.4 F (36.9 C) (Oral)  Wt 291 lb 3.2 oz (132.087 kg) Gen: NAD, alert, cooperative, and pleasant. HEENT: No JVD CV: RRR, no murmur Resp: CTAB, no wheezes, non-labored Ext: Warm, +2 pitting edema bilaterally (R>L) to the tibial tuberosities. Pulses intact bilaterally. No calf tenderness. No evidence of soft tissue infection. No drainage present. Neuro: Alert and oriented, Speech clear, evidence of tardive dyskinesia present  Assessment and plan:  Dyspnea Patient is here with continued shortness of breath. She was seen in the office less than 2 weeks ago with this similar complaint. It was noted that her weight was significantly elevated and the determination of fluid overload was made. At that time she was placed on oral Lasix and asked to follow-up in 1-2 weeks. Patient's weight continues to rise and is over 5 pounds higher than she was 2 weeks ago. - I've asked patient to restart her HCTZ - I have increased the Lasix dose from 20 daily to 40 daily for the next 5 days - Echocardiogram have been ordered at the previous visit;  this is scheduled for next week - I've asked patient to get in touch with her cardiologist and asked to be seen sometime next week. - If patient is unable to get in with her cardiologist I've asked that she follow-up next week with me, and if she is unable to get an appointment with me then I would like to have her stop been to obtain a BMP. - We discussed significant red flag symptoms in which she should report immediately to the ED. This time I am very close to sending her there myself however because she has the watchful eye of her son close at hand I feel fairly comfortable continuing outpatient care for the time being.  Precepted with Dr. Gwendlyn Deutscher  LEG EDEMA, BILATERAL See plan above  Chronic diastolic heart failure See plan above  Hypothyroidism TSH 5.27 Will need Free T4 and repeat TSH at next lab draw. (future orders placed) Will need to initiate Synthroid shortly thereafter.    Orders Placed This Encounter  Procedures  . BASIC METABOLIC PANEL WITH GFR    Standing Status: Future     Number of Occurrences:      Standing Expiration Date: 04/26/2016  . TSH    Standing Status: Future     Number of Occurrences:      Standing Expiration Date: 04/26/2016  . T4, free    Standing Status: Future     Number of Occurrences:      Standing Expiration Date: 04/26/2016    Meds  ordered this encounter  Medications  . furosemide (LASIX) 20 MG tablet    Sig: Take 2 tablets (40 mg total) by mouth daily.    Dispense:  6 tablet    Refill:  0     Elberta Leatherwood, MD,MS,  PGY2 04/27/2015 7:41 PM

## 2015-04-29 NOTE — Progress Notes (Signed)
Thanks, Will follow up on it. Zigmund Daniel, can we expedite her echo and follow-up, please?

## 2015-05-04 ENCOUNTER — Ambulatory Visit (HOSPITAL_COMMUNITY): Payer: Medicare Other | Attending: Cardiovascular Disease

## 2015-05-04 ENCOUNTER — Other Ambulatory Visit: Payer: Self-pay

## 2015-05-04 DIAGNOSIS — I110 Hypertensive heart disease with heart failure: Secondary | ICD-10-CM | POA: Diagnosis not present

## 2015-05-04 DIAGNOSIS — Z6841 Body Mass Index (BMI) 40.0 and over, adult: Secondary | ICD-10-CM | POA: Insufficient documentation

## 2015-05-04 DIAGNOSIS — I5032 Chronic diastolic (congestive) heart failure: Secondary | ICD-10-CM | POA: Insufficient documentation

## 2015-05-04 DIAGNOSIS — R0600 Dyspnea, unspecified: Secondary | ICD-10-CM | POA: Diagnosis present

## 2015-05-04 DIAGNOSIS — Z720 Tobacco use: Secondary | ICD-10-CM | POA: Diagnosis not present

## 2015-05-05 ENCOUNTER — Ambulatory Visit: Payer: Medicare Other | Admitting: Nurse Practitioner

## 2015-05-10 ENCOUNTER — Ambulatory Visit (INDEPENDENT_AMBULATORY_CARE_PROVIDER_SITE_OTHER): Payer: Medicare Other | Admitting: *Deleted

## 2015-05-10 ENCOUNTER — Telehealth: Payer: Self-pay | Admitting: *Deleted

## 2015-05-10 DIAGNOSIS — I495 Sick sinus syndrome: Secondary | ICD-10-CM | POA: Diagnosis not present

## 2015-05-10 LAB — CUP PACEART REMOTE DEVICE CHECK
Battery Impedance: 100 Ohm
Battery Remaining Longevity: 128 mo
Battery Voltage: 2.79 V
Brady Statistic AP VP Percent: 89 %
Brady Statistic AS VP Percent: 11 %
Implantable Lead Implant Date: 20070414
Implantable Lead Implant Date: 20070914
Implantable Lead Location: 753860
Lead Channel Impedance Value: 473 Ohm
Lead Channel Pacing Threshold Amplitude: 0.5 V
Lead Channel Pacing Threshold Amplitude: 0.75 V
Lead Channel Setting Pacing Amplitude: 2 V
Lead Channel Setting Pacing Amplitude: 2.5 V
Lead Channel Setting Pacing Pulse Width: 0.4 ms
MDC IDC LEAD LOCATION: 753859
MDC IDC MSMT LEADCHNL RA PACING THRESHOLD PULSEWIDTH: 0.4 ms
MDC IDC MSMT LEADCHNL RV IMPEDANCE VALUE: 716 Ohm
MDC IDC MSMT LEADCHNL RV PACING THRESHOLD PULSEWIDTH: 0.4 ms
MDC IDC SESS DTM: 20170425224557
MDC IDC SET LEADCHNL RV SENSING SENSITIVITY: 4 mV
MDC IDC STAT BRADY AP VS PERCENT: 0 %
MDC IDC STAT BRADY AS VS PERCENT: 0 %

## 2015-05-10 NOTE — Telephone Encounter (Signed)
Patient is calling to check on her echo results. Abigail Wallace,CMA

## 2015-05-11 ENCOUNTER — Telehealth: Payer: Self-pay | Admitting: Cardiovascular Disease

## 2015-05-11 ENCOUNTER — Telehealth: Payer: Self-pay | Admitting: *Deleted

## 2015-05-11 NOTE — Telephone Encounter (Signed)
Spoke w/ pt and informed that we did receive her remote transmission, and that the results were normal. Pt wanted more information. Call forwarded to device tech.

## 2015-05-11 NOTE — Telephone Encounter (Signed)
Message routed to Brunetta Genera for scheduling.

## 2015-05-11 NOTE — Telephone Encounter (Signed)
Please ask her to schedule office appt at least

## 2015-05-11 NOTE — Telephone Encounter (Signed)
Spoke to patient about episode of CP that she had last night. She said that she has had CP in the past, but last night was worse. She said that the pain was relieved by NTG. She said that she still feels "a little off" today.   I informed her that her ppm transmission was received and her device is functioning normally. She did not have any atrial or ventricular high rate episodes last night.  I strongly encouraged her to go to the ER for further evaluation of the CP. Patient declined stating that she would go if she had anymore CP.   Will inform Dr.Croitoru of pt c/o and notify her if anything further is recommended.

## 2015-05-11 NOTE — Telephone Encounter (Signed)
New Message ° °4. Are you calling to see if we received your device transmission? Yes  ° °

## 2015-05-11 NOTE — Telephone Encounter (Signed)
Called patient to inform her of echo results. Patient doing well. No complaints. Down 5 lbs since last visit. I asked if she was to see her cardiologist soon. She will be seeing the PA. I am pleased w/ this news as I would like for them to weigh in on her "severe LVH" noted on the Echo. No further questions.

## 2015-05-11 NOTE — Telephone Encounter (Signed)
See phone note from 4/26 @ 1143.

## 2015-05-11 NOTE — Progress Notes (Signed)
Remote pacemaker transmission.   

## 2015-05-16 ENCOUNTER — Encounter: Payer: Self-pay | Admitting: Physician Assistant

## 2015-05-16 ENCOUNTER — Ambulatory Visit (INDEPENDENT_AMBULATORY_CARE_PROVIDER_SITE_OTHER): Payer: Medicare Other | Admitting: Physician Assistant

## 2015-05-16 VITALS — BP 130/71 | HR 71 | Ht 66.0 in | Wt 293.0 lb

## 2015-05-16 DIAGNOSIS — I5032 Chronic diastolic (congestive) heart failure: Secondary | ICD-10-CM

## 2015-05-16 DIAGNOSIS — I495 Sick sinus syndrome: Secondary | ICD-10-CM

## 2015-05-16 DIAGNOSIS — I10 Essential (primary) hypertension: Secondary | ICD-10-CM

## 2015-05-16 DIAGNOSIS — Z720 Tobacco use: Secondary | ICD-10-CM

## 2015-05-16 DIAGNOSIS — I442 Atrioventricular block, complete: Secondary | ICD-10-CM

## 2015-05-16 DIAGNOSIS — R079 Chest pain, unspecified: Secondary | ICD-10-CM

## 2015-05-16 DIAGNOSIS — Z950 Presence of cardiac pacemaker: Secondary | ICD-10-CM

## 2015-05-16 NOTE — Progress Notes (Signed)
Patient ID: Abigail Wallace, female   DOB: [birth year], 70 y.o.   MRN: [id number]    Date:  05/16/2015   ID:  [name], [place] 11-28-1945, MRN [id number]  PCP:  Georges Lynch, MD  Primary Cardiologist:  Croitoru  Chief Complaint  Patient presents with  . Follow-up    Swelling, echocardiogram, some chest pain, shortness of breath, dizziness & lightheadedness     History of Present Illness: Abigail Wallace is a 70 y.o. morbidly obese female with complete heart block and sinus node dysfunction.  pacemaker is a dual-chamber Medtronic Adapta device implanted in August 2016 is a generator change out.  She is pacemaker dependent due to complete heart block (she has an idioventricular escape rhythm around 30 bpm). She also has significant sinus bradycardia.   She has minimal coronary atherosclerosis by cardiac catheterization performed in 2007 and no evidence of insufficiency by nuclear perfusion testing in 2012. By echocardiography she has normal left ventricular size and systolic function and no major structural cardiac abnormalities.  Noncardiac history includes chronic back pain, obesity and orthostatic hypotension. She smokes roughly 5 cigarettes a day and has been diagnosed with COPD, but I don't think this has been formally documented. There is also a mention of a diagnosis of obstructive sleep apnea in her chart. Use CPAP. She has a history of schizophrenia with psychosis requiring hospitalization in 2012. Since then, she has done well and lives independently. Her son, [name] ,accompanies her to the visit today (as always) and lives close by.  Her last echocardiogram was 05/04/2015 her ejection fraction was 55-60% with severe LVH, mildly dilated left atrium.  Patient presents for evaluation of LVH seen on a recent echocardiogram which was ordered by Dr.McKeag.  Patient is accompanied by her son gives most of her information. She's had 4 episodes of chest pain last month for which she's taken  nitroglycerin. Patient says it seems to help. She also reports shortness of breath while she is having chest pain. She has some hematochezia apparently which comes and goes. She is has an appointment on May 12 to see a surgeon regarding her hernia.  Patient has essentially given up walking with her walker because she is afraid of falling. She is here today in a wheelchair. She has started getting physical therapy.  The patient currently denies nausea, vomiting, fever,  orthopnea, dizziness, PND, cough, congestion, abdominal pain,  melena, lower extremity edema.  Wt Readings from Last 3 Encounters:  05/16/15 293 lb (132.904 kg)  04/27/15 291 lb 3.2 oz (132.087 kg)  04/15/15 285 lb (129.275 kg)     Past Medical History  Diagnosis Date  . Cystitis     Frequent episodes.   . Gout   . COPD (chronic obstructive pulmonary disease) ([place])   . GERD (gastroesophageal reflux disease)   . Mental disorder   . Psychosis   . Memory difficulties 12/02/2013  . Tardive dyskinesia 12/02/2013  . Hypertension   . Coronary artery disease     NON-CRITICAL    Current Outpatient Prescriptions  Medication Sig Dispense Refill  . acetaminophen (TYLENOL) 500 MG tablet Take 2,000 mg by mouth at bedtime.     Marland Kitchen allopurinol (ZYLOPRIM) 100 MG tablet take 1 tablet by mouth once daily 90 tablet 1  . CRESTOR 5 MG tablet take 1 tablet by mouth every evening 30 tablet 11  . divalproex (DEPAKOTE ER) 500 MG 24 hr tablet take 1 tablet by mouth daily (Patient taking differently: Take 500 mg  by mouth daily. ) 30 tablet 0  . hydrochlorothiazide (MICROZIDE) 12.5 MG capsule Take 2 capsules (25 mg total) by mouth daily. 60 capsule 6  . losartan (COZAAR) 100 MG tablet Take 1 tablet (100 mg total) by mouth daily. 90 tablet 3  . metoprolol (LOPRESSOR) 50 MG tablet take 1 tablet by mouth twice a day 60 tablet 5  . nitroGLYCERIN (NITROSTAT) 0.4 MG SL tablet Place 1 tablet (0.4 mg total) under the tongue every 5 (five) minutes x 3  doses as needed. For chest 25 tablet 1  . OLANZapine (ZYPREXA) 10 MG tablet Take 10 mg by mouth at bedtime.  0  . QUEtiapine (SEROQUEL) 25 MG tablet Take 25 mg by mouth at bedtime.   0   No current facility-administered medications for this visit.    Allergies:    Allergies  Allergen Reactions  . Abilify [Aripiprazole] Shortness Of Breath and Other (See Comments)    Breathing problems  . Aripiprazole Anaphylaxis and Other (See Comments)    Tremors  . Benadryl [Diphenhydramine Hcl] Other (See Comments)    States affects her vision  . Cogentin [Benztropine] Other (See Comments)    Affects mobility  . Haloperidol Lactate Other (See Comments)    Vision changes  . Latuda [Lurasidone Hcl] Other (See Comments)    "Tremors and shakes."   . Remeron [Mirtazapine] Other (See Comments)    Tremors and shakes  . Clozapine Other (See Comments)  . Haloperidol Lactate Other (See Comments)    Shaking and drools  . Codeine Other (See Comments)    Unknown   . Latex Rash    Social History:  The patient  reports that she has been smoking Cigarettes.  She has been smoking about 0.25 packs per day. She has never used smokeless tobacco. She reports that she does not drink alcohol or use illicit drugs.   Family history:   Family History  Problem Relation Age of Onset  . Diabetes type II Son   . Heart disease Mother   . Kidney disease Mother   . Heart disease Father   . Kidney disease Father   . Stroke Sister     ROS:  Please see the history of present illness.  All other systems reviewed and negative.   PHYSICAL EXAM: VS:  BP 130/71 mmHg  Pulse 71  Ht 5\' 6"  (1.676 m)  Wt 293 lb (132.904 kg)  BMI 47.31 kg/m2  SpO2 95% Morbidly obese, well developed, in no acute distress, sitting in a wheel chair HEENT: Pupils are equal round react to light accommodation extraocular movements are intact.  Neck: no JVDNo cervical lymphadenopathy. Cardiac: Regular rate and rhythm without murmurs rubs or  gallops. Lungs:  clear to auscultation bilaterally, no wheezing, rhonchi or rales Abd: soft, nontender, positive bowel sounds all quadrants, no hepatosplenomegaly Ext: trace lower extremity edema.  2+ radial and dorsalis pedis pulses. Skin: warm and dry Neuro:  Grossly normal    ASSESSMENT AND PLAN:  Problem List Items Addressed This Visit    Tobacco abuse   SICK SINUS SYNDROME   Pacemaker   HTN (hypertension) - Primary   Relevant Orders   Myocardial Perfusion Imaging   Chronic diastolic heart failure (HCC)   Chest pain   Relevant Orders   Myocardial Perfusion Imaging   CHB (complete heart block) [facility])     Abigail Wallace presents today for evaluation of LVH seen on a recent echocardiogram. I suspect this is related to high blood pressure  over the last several years. Back in January her HCTZ was increased to 25 mg. She is also on Cozaar 100 mg daily, metoprolol 50 mg twice daily. Today her blood pressure is controlled.   She also reports having 4 episodes of chest pain in the last month, for which she's taken nitroglycerin, which seems to help.  I will order a Lexiscan Cardiolite. Her last one was in 2012. We'll schedule this for after May 12 as she has an appointment with a surgeon regarding her hernia and asked to many appointments to do before then.  She did have a recent echocardiogram which was showed normal LV function. Suspect some of this may be related to anxiety. She is a mild lower from a edema on exam but otherwise seems to be euvolemic. Pacemaker was checked on the 26th and was functioning normally. She continues to smoke 1-2 cigarettes per week.

## 2015-05-16 NOTE — Patient Instructions (Addendum)
Your physician wants you to follow-up in: 3 Months with Dr Sallyanne Kuster. You will receive a reminder letter in the mail two months in advance. If you don't receive a letter, please call our office to schedule the follow-up appointment.  Your physician has requested that you have a lexiscan myoview. For further information please visit [email or web address]. Please follow instruction sheet, as given.  LVH:  Left Ventricular Hypertrophy(Thick muscle).

## 2015-05-27 ENCOUNTER — Other Ambulatory Visit: Payer: Self-pay | Admitting: Surgery

## 2015-06-07 ENCOUNTER — Ambulatory Visit (HOSPITAL_COMMUNITY): Payer: Self-pay

## 2015-06-08 ENCOUNTER — Ambulatory Visit (HOSPITAL_COMMUNITY): Payer: Self-pay

## 2015-06-22 ENCOUNTER — Encounter: Payer: Self-pay | Admitting: Cardiology

## 2015-06-28 ENCOUNTER — Telehealth (HOSPITAL_COMMUNITY): Payer: Self-pay

## 2015-06-28 NOTE — Telephone Encounter (Signed)
Pt states she does have difficulty with transfer from wheelchair. Pt is to bring her son with her for assistance with transfer.

## 2015-06-30 ENCOUNTER — Inpatient Hospital Stay (HOSPITAL_COMMUNITY): Admission: RE | Admit: 2015-06-30 | Payer: Self-pay | Source: Ambulatory Visit

## 2015-07-01 ENCOUNTER — Ambulatory Visit (HOSPITAL_COMMUNITY): Payer: Self-pay

## 2015-07-03 ENCOUNTER — Other Ambulatory Visit: Payer: Self-pay | Admitting: Cardiovascular Disease

## 2015-07-04 NOTE — Telephone Encounter (Signed)
Rx(s) sent to pharmacy electronically.  

## 2015-07-05 ENCOUNTER — Ambulatory Visit (HOSPITAL_COMMUNITY): Payer: Self-pay

## 2015-07-22 ENCOUNTER — Telehealth: Payer: Self-pay | Admitting: Family Medicine

## 2015-07-22 NOTE — Telephone Encounter (Signed)
Unless I missed it in her chart it doesn't look like patient has been prescribed a topical agent in about 4 years. He would likely be most beneficial if patient could be seen in our clinic for this issue, especially since this would be an appropriate same day visit ailment.

## 2015-07-22 NOTE — Telephone Encounter (Signed)
Pt called because under her breast she is broken out again. The last time this happened the doctor prescribe her some antibiotic cream. She is hoping the doctor would call in a refill on this. jw

## 2015-07-25 NOTE — Telephone Encounter (Signed)
Pt calls back.  She states that she found the medication and it is Nystatin.  She explains that she would prefer not to make an appointment if possible, but to please let her know. Fleeger, [name], CMA

## 2015-07-26 ENCOUNTER — Other Ambulatory Visit: Payer: Self-pay | Admitting: Family Medicine

## 2015-07-26 MED ORDER — NYSTATIN 100000 UNIT/GM EX CREA: 1.0000 "application " | TOPICAL_CREAM | Freq: Two times a day (BID) | CUTANEOUS | Status: DC

## 2015-07-26 NOTE — Telephone Encounter (Signed)
Cream sent in

## 2015-07-27 NOTE — Telephone Encounter (Signed)
Patient informed. 

## 2015-07-28 ENCOUNTER — Telehealth (HOSPITAL_COMMUNITY): Payer: Self-pay

## 2015-07-28 NOTE — Telephone Encounter (Signed)
Encounter complete. 

## 2015-08-02 ENCOUNTER — Other Ambulatory Visit: Payer: Self-pay | Admitting: Cardiovascular Disease

## 2015-08-02 ENCOUNTER — Inpatient Hospital Stay (HOSPITAL_COMMUNITY): Admission: RE | Admit: 2015-08-02 | Payer: Medicare Other | Source: Ambulatory Visit

## 2015-08-03 ENCOUNTER — Ambulatory Visit (HOSPITAL_COMMUNITY): Payer: Medicare Other

## 2015-08-22 ENCOUNTER — Encounter (INDEPENDENT_AMBULATORY_CARE_PROVIDER_SITE_OTHER): Payer: Self-pay

## 2015-08-22 ENCOUNTER — Ambulatory Visit (INDEPENDENT_AMBULATORY_CARE_PROVIDER_SITE_OTHER): Payer: Medicare Other | Admitting: Cardiovascular Disease

## 2015-08-22 ENCOUNTER — Encounter: Payer: Self-pay | Admitting: Cardiovascular Disease

## 2015-08-22 VITALS — BP 139/78 | HR 75 | Ht 66.0 in

## 2015-08-22 DIAGNOSIS — I495 Sick sinus syndrome: Secondary | ICD-10-CM

## 2015-08-22 DIAGNOSIS — I5032 Chronic diastolic (congestive) heart failure: Secondary | ICD-10-CM

## 2015-08-22 DIAGNOSIS — I10 Essential (primary) hypertension: Secondary | ICD-10-CM

## 2015-08-22 DIAGNOSIS — I442 Atrioventricular block, complete: Secondary | ICD-10-CM | POA: Diagnosis not present

## 2015-08-22 DIAGNOSIS — Z950 Presence of cardiac pacemaker: Secondary | ICD-10-CM | POA: Diagnosis not present

## 2015-08-22 NOTE — Patient Instructions (Signed)
Dr Sallyanne Kuster recommends that you continue on your current medications as directed. Please refer to the Current Medication list given to you today.  Remote monitoring is used to monitor your Pacemaker of ICD from home. This monitoring reduces the number of office visits required to check your device to one time per year. It allows [place] to keep an eye on the functioning of your device to ensure it is working properly. You are scheduled for a device check from home on Monday, November 6th, 2017. You may send your transmission at any time that day. If you have a wireless device, the transmission will be sent automatically. After your physician reviews your transmission, you will receive a postcard with your next transmission date.  Dr Sallyanne Kuster recommends that you schedule a follow-up appointment in 6 months with a device check. You will receive a reminder letter in the mail two months in advance. If you don't receive a letter, please call our office to schedule the follow-up appointment.  If you need a refill on your cardiac medications before your next appointment, please call your pharmacy.

## 2015-08-22 NOTE — Progress Notes (Signed)
Patient ID: Abigail Wallace, female   DOB: [birth year], 70 y.o.   MRN: [id number]    Cardiology Office Note    Date:  08/22/2015   ID:  [address] [place], [place] 19-Jan-1945, MRN [id number]  PCP:  Georges Lynch, MD  Cardiologist:   Sanda Klein, MD   Chief Complaint  Patient presents with  . Follow-up    3 months--MDT-G  pt c/o headaches; pain on right side of heart; SOB on minimal exertion; swelling in legs/feet/ankles, bilateral    History of Present Illness:  Molinda Tyra is a 70 y.o. female with complete heart block and sinus node dysfunction who presents for a pacemaker check, roughly 12 months following a pacemaker generator change. [name] did not come with her today since he is having some back problems.  She reports intermittent ankle swelling, not particularly bad at this time. She does not allow [place] to weigh her today. She denies angina pectoris, syncope, palpitations. She has not had orthopnea. As always she is very sedentary, so it is hard to assess whether she has any dyspnea.  Her pacemaker is a dual-chamber Medtronic Adapta device implanted in August 2016 as a generator change out. She is pacemaker dependent due to complete heart block (she has an idioventricular escape rhythm around 30 bpm). She also has significant sinus bradycardia. Comparable to what she has shown historically she has 88% atrial pacing and has 100% ventricular pacing. No episodes of atrial fibrillation or other significant atrial tachycardia has been recorded. No new episodes of nonsustained ventricular tachycardia in the last 6 months. She has had a handful of episodes of atrial mode switch, all of them less than a minute in duration. Stenting rhythm today's AV sequential pacing  She initially received a dual-chamber permanent pacemaker in 2007 for symptomatic AV block. She has minimal coronary atherosclerosis by cardiac catheterization performed in 2007 and no evidence of insufficiency by nuclear perfusion testing in  2012. By echocardiography she has normal left ventricular size and systolic function and no major structural cardiac abnormalities.   Noncardiac history includes chronic back pain, obesity and orthostatic hypotension. She smokes roughly 5 cigarettes a day and has been diagnosed with COPD, but I don't think this has been formally documented. There is also a mention of a diagnosis of obstructive sleep apnea in her chart. Use CPAP. She has a history of schizophrenia with psychosis requiring hospitalization in 2012. Since then, she has done well and lives independently. Her son, [name] ,accompanies her to most visits.   Past Medical History:  Diagnosis Date  . COPD (chronic obstructive pulmonary disease) ([place])   . Coronary artery disease    NON-CRITICAL  . Cystitis    Frequent episodes.   Marland Kitchen GERD (gastroesophageal reflux disease)   . Gout   . Hypertension   . Memory difficulties 12/02/2013  . Mental disorder   . Psychosis   . Tardive dyskinesia 12/02/2013    Past Surgical History:  Procedure Laterality Date  . CARDIAC CATHETERIZATION  2007   Non-critical.   . CHOLECYSTECTOMY    . EP IMPLANTABLE DEVICE N/A 08/31/2014   Procedure: PPM Generator Changeout;  Surgeon: Sanda Klein, MD;  Location: [facility];  Service: Cardiovascular;  Laterality: N/A;  . INSERT / REPLACE / REMOVE PACEMAKER  2007   [place]/SYMPTOMATIC HEART BLOCK  . PACEMAKER PLACEMENT  09/28/2005   2/2 SSS?  . Persantine stress test  05/01/2010   EF 66%. Normal LV sys fx. Unchanged from previous studies.   Marland Kitchen  TRANSTHORACIC ECHOCARDIOGRAM  09/08/10   SEVERE CONCENTRIC HYPERTROPHY.LV FUNCTION WAS VIGOROUS.EF 65%-70%.VENTRICULAR SEPTUM-INCOORDINATE MOTION.LEFT ATRIUM-MILDLY DILATED.TRIVIAL TR.  . TUBAL LIGATION      Outpatient Medications Prior to Visit  Medication Sig Dispense Refill  . acetaminophen (TYLENOL) 500 MG tablet Take 2,000 mg by mouth at bedtime.     Marland Kitchen allopurinol (ZYLOPRIM) 100 MG tablet take 1 tablet  by mouth once daily 90 tablet 3  . divalproex (DEPAKOTE ER) 500 MG 24 hr tablet take 1 tablet by mouth daily (Patient taking differently: Take 500 mg by mouth daily. ) 30 tablet 0  . hydrochlorothiazide (MICROZIDE) 12.5 MG capsule Take 2 capsules (25 mg total) by mouth daily. 60 capsule 6  . losartan (COZAAR) 100 MG tablet Take 1 tablet (100 mg total) by mouth daily. 90 tablet 3  . metoprolol (LOPRESSOR) 50 MG tablet take 1 tablet by mouth twice a day 60 tablet 5  . nitroGLYCERIN (NITROSTAT) 0.4 MG SL tablet Place 1 tablet (0.4 mg total) under the tongue every 5 (five) minutes x 3 doses as needed. For chest 25 tablet 1  . nystatin cream (MYCOSTATIN) Apply 1 application topically 2 (two) times daily. 30 g 0  . OLANZapine (ZYPREXA) 10 MG tablet Take 10 mg by mouth at bedtime.  0  . QUEtiapine (SEROQUEL) 25 MG tablet Take 25 mg by mouth at bedtime.   0  . rosuvastatin (CRESTOR) 5 MG tablet take 1 tablet by mouth every evening 30 tablet 11   No facility-administered medications prior to visit.      Allergies:   Abilify [aripiprazole]; Aripiprazole; Benadryl [diphenhydramine hcl]; Cogentin [benztropine]; Haloperidol lactate; Latuda [lurasidone hcl]; Remeron [mirtazapine]; Clozapine; Haloperidol lactate; Codeine; and Latex   Social History   Social History  . Marital status: Divorced    Spouse name: N/A  . Number of children: N/A  . Years of education: N/A   Social History Main Topics  . Smoking status: Current Some Day Smoker    Packs/day: 0.25    Types: Cigarettes  . Smokeless tobacco: Never Used     Comment: electronic cigarettes  . Alcohol use No  . Drug use: No  . Sexual activity: Not Asked   Other Topics Concern  . None   Social History Narrative   Lives alone. Older sister lives nearby and helps take her to appointments and to take care of her; sister uses wheelchair, unsure why (thin and appears healthy).    Married.   Children: 2 daughters, 1 son; 44 GC, 5 GGC.     Occupation: disabled.    Caffeine:   Tobacco: 5-6/day   Denies alcohol.      Family History:  The patient's family history includes Diabetes type II in her son; Heart disease in her father and mother; Kidney disease in her father and mother; Stroke in her sister.   ROS:   Please see the history of present illness.    Review of Systems  Constitution: Negative for chills, decreased appetite and fever.  HENT: Negative for headaches and nosebleeds.   Eyes: Negative for visual disturbance.  Cardiovascular: Positive for leg swelling. Negative for chest pain, irregular heartbeat, orthopnea, palpitations and syncope.  Respiratory: Positive for snoring. Negative for cough, hemoptysis and wheezing.   Endocrine: Negative for cold intolerance, heat intolerance, polydipsia, polyphagia and polyuria.  Hematologic/Lymphatic: Does not bruise/bleed easily.  Skin: Negative for itching and rash.  Musculoskeletal: Positive for arthritis, back pain, joint pain and neck pain.  Gastrointestinal: Negative for abdominal pain, change  in bowel habit, hematemesis, melena, nausea and vomiting.  Genitourinary: Negative for frequency, hematuria, non-menstrual bleeding and urgency.  Neurological: Positive for difficulty with concentration and tremors.  Psychiatric/Behavioral: Negative for depression and hallucinations.  Allergic/Immunologic: Negative.    All other systems reviewed and are negative.   PHYSICAL EXAM:   VS:  BP 139/78 (BP Location: Left Arm, Patient Position: Sitting, Cuff Size: Large)   Pulse 75   Ht 5\' 6"  (1.676 m)    GEN: Well nourished, well developed, in no acute distress  HEENT: normal  Neck: no JVD, carotid bruits, or masses Cardiac: RRR; paradoxically split second heart sound; no murmurs, rubs, or gallops, mild ankle edema, right greater than left ; healthy pacemaker site in the left subclavian area Respiratory:  clear to auscultation bilaterally, normal work of breathing GI: soft,  nontender, nondistended, + BS MS: no deformity or atrophy  Skin: warm and dry, no rash Neuro:  Alert and Oriented x 3, Strength and sensation are intact. Prominent tremor and tardive dyskinesia  Psych: euthymic mood, full affect  Wt Readings from Last 3 Encounters:  05/16/15 293 lb (132.9 kg)  04/27/15 291 lb 3.2 oz (132.1 kg)  04/15/15 285 lb (129.3 kg)      Studies/Labs Reviewed:   EKG:  EKG is ordered today.  The ekg ordered today demonstrates AV sequential pacing  Recent Labs: 04/15/2015: ALT 11; Brain Natriuretic Peptide 65.2; BUN 22; Creat 1.03; Hemoglobin 13.0; Platelets 286; Potassium 4.2; Sodium 137; TSH 5.27   Lipid Panel    Component Value Date/Time   CHOL 122 11/19/2010 0605   TRIG 73 11/19/2010 0605   HDL 45 11/19/2010 0605   CHOLHDL 2.7 11/19/2010 0605   VLDL 15 11/19/2010 0605   LDLCALC 62 11/19/2010 0605   LDLDIRECT 80 03/20/2012 1510     ASSESSMENT:    1. Chronic diastolic heart failure ([place])   2. Essential hypertension   3. SICK SINUS SYNDROME   4. CHB (complete heart block) ([facility])   5. Pacemaker      PLAN:  In order of problems listed above:  1. CHF: Seems to be close to euvolemia. She would not allow [place] to weigh her today.. 2. HTN: Adequate control. 3. SSS: She is quite sedentary. Satisfactory heart rate histogram distribution with the current pacemaker sensor settings. 4. CHB: She has a very slow idioventricular escape rhythm. She should be considered pacemaker dependent. 5. PPM: Normal pacemaker function. CareLink downloads every 3 months and office visit in 6 months..     Medication Adjustments/Labs and Tests Ordered: Current medicines are reviewed at length with the patient today.  Concerns regarding medicines are outlined above.  Medication changes, Labs and Tests ordered today are listed in the Patient Instructions below. Patient Instructions  Dr Sallyanne Kuster recommends that you continue on your current medications as directed. Please refer  to the Current Medication list given to you today.  Remote monitoring is used to monitor your Pacemaker of ICD from home. This monitoring reduces the number of office visits required to check your device to one time per year. It allows [place] to keep an eye on the functioning of your device to ensure it is working properly. You are scheduled for a device check from home on Monday, November 6th, 2017. You may send your transmission at any time that day. If you have a wireless device, the transmission will be sent automatically. After your physician reviews your transmission, you will receive a postcard with your next transmission date.  Dr Sallyanne Kuster recommends that you schedule a follow-up appointment in 6 months with a device check. You will receive a reminder letter in the mail two months in advance. If you don't receive a letter, please call our office to schedule the follow-up appointment.  If you need a refill on your cardiac medications before your next appointment, please call your pharmacy.      Signed, Sanda Klein, MD  08/22/2015 3:07 PM    [facility] [address], [place], [place]  [address] Phone: [phone]; Fax: [phone]

## 2015-08-25 LAB — CUP PACEART INCLINIC DEVICE CHECK
Battery Voltage: 2.79 V
Brady Statistic AP VS Percent: 0 %
Brady Statistic AS VP Percent: 12 %
Brady Statistic AS VS Percent: 0 %
Date Time Interrogation Session: 20170807131338
Implantable Lead Implant Date: 20070914
Lead Channel Impedance Value: 699 Ohm
Lead Channel Pacing Threshold Amplitude: 0.75 V
Lead Channel Pacing Threshold Pulse Width: 0.4 ms
Lead Channel Setting Pacing Amplitude: 2 V
Lead Channel Setting Pacing Amplitude: 2.5 V
Lead Channel Setting Sensing Sensitivity: 4 mV
MDC IDC LEAD IMPLANT DT: 20070414
MDC IDC LEAD LOCATION: 753859
MDC IDC LEAD LOCATION: 753860
MDC IDC MSMT BATTERY IMPEDANCE: 100 Ohm
MDC IDC MSMT BATTERY REMAINING LONGEVITY: 130 mo
MDC IDC MSMT LEADCHNL RA IMPEDANCE VALUE: 532 Ohm
MDC IDC MSMT LEADCHNL RA PACING THRESHOLD AMPLITUDE: 0.5 V
MDC IDC MSMT LEADCHNL RA PACING THRESHOLD PULSEWIDTH: 0.4 ms
MDC IDC SET LEADCHNL RV PACING PULSEWIDTH: 0.4 ms
MDC IDC STAT BRADY AP VP PERCENT: 88 %

## 2015-08-29 ENCOUNTER — Encounter: Payer: Self-pay | Admitting: Cardiovascular Disease

## 2015-09-01 ENCOUNTER — Other Ambulatory Visit: Payer: Self-pay | Admitting: Cardiovascular Disease

## 2015-09-01 DIAGNOSIS — I10 Essential (primary) hypertension: Secondary | ICD-10-CM

## 2015-09-01 NOTE — Telephone Encounter (Signed)
REFILL 

## 2015-09-09 ENCOUNTER — Telehealth: Payer: Self-pay | Admitting: Family Medicine

## 2015-09-09 NOTE — Telephone Encounter (Signed)
Pt called and would like the doctor to call in something for her sinus congestion. She also received a jury summons for her to be on jury duty and she would like the doctor to write explaining because of her health and age it would not be in her best interest. She will be mailing the jury summons to [place] for the doctor to fill out. Blima Rich

## 2015-09-12 ENCOUNTER — Encounter: Payer: Self-pay | Admitting: Family Medicine

## 2015-09-12 NOTE — Telephone Encounter (Signed)
Note left up front. Congestion is likely viral, stay well hydrated with water, take tylenol for any fevers or swelling.

## 2015-09-12 NOTE — Telephone Encounter (Signed)
Patient son informed, he states patient has no transportation and requests that letter be mail to address in chart. Letter mailed.

## 2015-09-21 ENCOUNTER — Encounter: Payer: Self-pay | Admitting: Family Medicine

## 2015-10-28 ENCOUNTER — Other Ambulatory Visit: Payer: Self-pay | Admitting: Surgery

## 2015-11-21 ENCOUNTER — Telehealth: Payer: Self-pay | Admitting: Cardiovascular Disease

## 2015-11-21 ENCOUNTER — Ambulatory Visit (INDEPENDENT_AMBULATORY_CARE_PROVIDER_SITE_OTHER): Payer: Medicare Other | Admitting: *Deleted

## 2015-11-21 DIAGNOSIS — I495 Sick sinus syndrome: Secondary | ICD-10-CM

## 2015-11-21 NOTE — Telephone Encounter (Signed)
New message  Pt call requesting to speak with RN. Pt has a remote check and states she was to call the office. Please call back to discuss

## 2015-11-21 NOTE — Telephone Encounter (Signed)
Spoke with patient and confirmed transmission. I explained that unless we see something on her transmission that needs addressed she will receive a letter in the mail within a couple of weeks.

## 2015-11-22 NOTE — Progress Notes (Signed)
Remote pacemaker transmission.   

## 2015-11-23 ENCOUNTER — Encounter: Payer: Self-pay | Admitting: Cardiology

## 2015-12-04 LAB — CUP PACEART REMOTE DEVICE CHECK
Battery Impedance: 110 Ohm
Battery Remaining Longevity: 128 mo
Battery Voltage: 2.79 V
Brady Statistic AP VP Percent: 85 %
Date Time Interrogation Session: 20171106160209
Implantable Lead Implant Date: 20070914
Implantable Lead Location: 753860
Implantable Lead Model: 4092
Implantable Pulse Generator Implant Date: 20160816
Lead Channel Impedance Value: 584 Ohm
Lead Channel Impedance Value: 682 Ohm
Lead Channel Setting Pacing Amplitude: 2 V
Lead Channel Setting Pacing Amplitude: 2.5 V
Lead Channel Setting Pacing Pulse Width: 0.4 ms
Lead Channel Setting Sensing Sensitivity: 4 mV
MDC IDC LEAD IMPLANT DT: 20070414
MDC IDC LEAD LOCATION: 753859
MDC IDC MSMT LEADCHNL RA PACING THRESHOLD AMPLITUDE: 0.625 V
MDC IDC MSMT LEADCHNL RA PACING THRESHOLD PULSEWIDTH: 0.4 ms
MDC IDC MSMT LEADCHNL RV PACING THRESHOLD AMPLITUDE: 0.75 V
MDC IDC MSMT LEADCHNL RV PACING THRESHOLD PULSEWIDTH: 0.4 ms
MDC IDC STAT BRADY AP VS PERCENT: 0 %
MDC IDC STAT BRADY AS VP PERCENT: 15 %
MDC IDC STAT BRADY AS VS PERCENT: 0 %

## 2015-12-05 ENCOUNTER — Telehealth: Payer: Self-pay | Admitting: Family Medicine

## 2015-12-05 NOTE — Telephone Encounter (Signed)
Son called for his mother who needs orders now to be sent to [facility] so that his mother can get her pull ups. She is almost out and he used to be able to order this. But now Medicaid requires the doctor to order and send in to [facility]. jw

## 2015-12-06 NOTE — Telephone Encounter (Signed)
Abigail Wallace,  You wouldn't know how one might get this done, would you?

## 2015-12-07 NOTE — Telephone Encounter (Signed)
We just need to fax over a script/order for incontinence supplies to [facility] with the dx code. You can give this to me and I will fax it over.

## 2015-12-07 NOTE — Telephone Encounter (Signed)
Sent an epic message to Darlina Guys at [facility] to find out the best way to go about ordering this for patient. Awaiting response.

## 2015-12-13 ENCOUNTER — Other Ambulatory Visit: Payer: Self-pay | Admitting: Family Medicine

## 2015-12-13 DIAGNOSIS — N3281 Overactive bladder: Secondary | ICD-10-CM

## 2015-12-13 DIAGNOSIS — R32 Unspecified urinary incontinence: Secondary | ICD-10-CM

## 2015-12-13 MED ORDER — INCONTINENCE SUPPLIES MISC: 1.0000 [IU] | 99 refills | Status: DC | PRN

## 2015-12-13 NOTE — Telephone Encounter (Signed)
Done. Order handed to nursing.

## 2015-12-13 NOTE — Telephone Encounter (Signed)
Order with patient demographics and insurance cards faxed to [facility] at [phone]

## 2016-01-10 ENCOUNTER — Telehealth: Payer: Self-pay | Admitting: Internal Medicine

## 2016-01-10 NOTE — Telephone Encounter (Signed)
**  After Hours/ Emergency Line Call*  Received text page with return phone number [phone] and message reporting son is concerned about mother but I get a busy signal. So I called Mr. Abigail Wallace cell [phone]  Received a call to report that [facility]. Son reports that she has had issues with yeast infection. Son doesn't want her yeast infections to get out of control. Has been using Nystatin cream. Reports PCP calls in Fluconazole for her and requests this.  Offered son a same day appointment for tomorrow 12/27 but he declined and reported he will try to call PCP tomorrow.  Will forward to PCP.  Smiley Houseman, MD PGY-2, [facility]

## 2016-01-11 ENCOUNTER — Other Ambulatory Visit: Payer: Self-pay | Admitting: Family Medicine

## 2016-01-11 MED ORDER — FLUCONAZOLE 150 MG PO TABS: 150.0000 mg | ORAL_TABLET | Freq: Once | ORAL | 0 refills | Status: AC

## 2016-02-22 ENCOUNTER — Other Ambulatory Visit: Payer: Self-pay | Admitting: Cardiovascular Disease

## 2016-02-22 NOTE — Telephone Encounter (Signed)
Pt son calling to request refill of generic Crestor, said she takes 10mg , and med list shows 5mg , insurance sent a letter that they would no longer pay for her 10mg , if she is to take 5mg  she needs a refill called into [facility] requesting 90 days if insurance will pay if not do 30 day

## 2016-02-23 MED ORDER — ROSUVASTATIN CALCIUM 5 MG PO TABS: 5.0000 mg | ORAL_TABLET | Freq: Every evening | ORAL | 1 refills | Status: DC

## 2016-02-23 NOTE — Telephone Encounter (Signed)
crestor 5mg  has been on medication list since 12/29/2012 or earlier.  Rx(s) sent to pharmacy electronically for 90 day supply

## 2016-02-29 ENCOUNTER — Encounter: Payer: Medicare Other | Admitting: Cardiovascular Disease

## 2016-02-29 ENCOUNTER — Telehealth: Payer: Self-pay | Admitting: Cardiovascular Disease

## 2016-02-29 DIAGNOSIS — E7800 Pure hypercholesterolemia, unspecified: Secondary | ICD-10-CM

## 2016-02-29 MED ORDER — PRAVASTATIN SODIUM 40 MG PO TABS: 40.0000 mg | ORAL_TABLET | Freq: Every evening | ORAL | 6 refills | Status: DC

## 2016-02-29 NOTE — Telephone Encounter (Signed)
Returned call to patient's son Abigail Wallace.He stated insurance has denied brand name and generic crestor.Insurance will approve atorvastatin,simvastatin,pravastatin,lovastatin.Advised I will send message to Dr.Croitoru.

## 2016-02-29 NOTE — Telephone Encounter (Signed)
Pt's insurance will no longer pay for the generic Crestor.The pharmacist said they had sent  4 other medicine that pt can take.This was sent 4 days ago.If doctor feels she needs to stay on the [place] can do a prior authorization.Please let her son know something today. Nurse completed this note.

## 2016-02-29 NOTE — Telephone Encounter (Signed)
Try pravastatin 40 mg at bedtime please. Check lipids 3 months. MCr

## 2016-02-29 NOTE — Telephone Encounter (Signed)
Returned call to son Jori Moll.Dr.Croitoru's recommendations given.Repeat lipid and hepatic panels in 3 months lab orders mailed.

## 2016-03-02 ENCOUNTER — Other Ambulatory Visit: Payer: Self-pay | Admitting: Cardiovascular Disease

## 2016-03-02 NOTE — Telephone Encounter (Signed)
Rx(s) sent to pharmacy electronically.  

## 2016-03-05 DIAGNOSIS — F200 Paranoid schizophrenia: Secondary | ICD-10-CM | POA: Diagnosis not present

## 2016-03-14 ENCOUNTER — Encounter: Payer: Medicare Other | Admitting: Cardiovascular Disease

## 2016-03-31 ENCOUNTER — Other Ambulatory Visit: Payer: Self-pay | Admitting: Cardiovascular Disease

## 2016-04-02 ENCOUNTER — Other Ambulatory Visit: Payer: Self-pay | Admitting: Cardiovascular Disease

## 2016-04-02 NOTE — Telephone Encounter (Signed)
Rx(s) sent to pharmacy electronically.  

## 2016-04-13 ENCOUNTER — Ambulatory Visit (INDEPENDENT_AMBULATORY_CARE_PROVIDER_SITE_OTHER): Payer: Medicare Other | Admitting: *Deleted

## 2016-04-13 DIAGNOSIS — I442 Atrioventricular block, complete: Secondary | ICD-10-CM

## 2016-04-16 ENCOUNTER — Encounter: Payer: Self-pay | Admitting: Cardiology

## 2016-04-16 LAB — CUP PACEART REMOTE DEVICE CHECK
Battery Impedance: 110 Ohm
Battery Remaining Longevity: 126 mo
Battery Voltage: 2.79 V
Brady Statistic AP VP Percent: 79 %
Brady Statistic AS VP Percent: 21 %
Implantable Lead Implant Date: 20070414
Implantable Lead Location: 753860
Implantable Lead Model: 4092
Implantable Lead Model: 5594
Implantable Pulse Generator Implant Date: 20160816
Lead Channel Impedance Value: 500 Ohm
Lead Channel Impedance Value: 664 Ohm
Lead Channel Sensing Intrinsic Amplitude: 1.4 mV
Lead Channel Setting Pacing Amplitude: 2 V
Lead Channel Setting Pacing Amplitude: 2.5 V
Lead Channel Setting Pacing Pulse Width: 0.4 ms
MDC IDC LEAD IMPLANT DT: 20070914
MDC IDC LEAD LOCATION: 753859
MDC IDC MSMT LEADCHNL RA PACING THRESHOLD AMPLITUDE: 0.5 V
MDC IDC MSMT LEADCHNL RA PACING THRESHOLD PULSEWIDTH: 0.4 ms
MDC IDC MSMT LEADCHNL RV PACING THRESHOLD AMPLITUDE: 0.75 V
MDC IDC MSMT LEADCHNL RV PACING THRESHOLD PULSEWIDTH: 0.4 ms
MDC IDC SESS DTM: 20180330144501
MDC IDC SET LEADCHNL RV SENSING SENSITIVITY: 4 mV
MDC IDC STAT BRADY AP VS PERCENT: 0 %
MDC IDC STAT BRADY AS VS PERCENT: 0 %

## 2016-04-16 NOTE — Progress Notes (Signed)
Remote pacemaker transmission.   

## 2016-04-20 ENCOUNTER — Ambulatory Visit (INDEPENDENT_AMBULATORY_CARE_PROVIDER_SITE_OTHER): Payer: Medicare Other | Admitting: Family Medicine

## 2016-04-20 ENCOUNTER — Encounter: Payer: Self-pay | Admitting: Family Medicine

## 2016-04-20 VITALS — BP 162/86 | HR 86 | Temp 98.5°F | Ht 66.0 in | Wt 293.6 lb

## 2016-04-20 DIAGNOSIS — N938 Other specified abnormal uterine and vaginal bleeding: Secondary | ICD-10-CM | POA: Diagnosis not present

## 2016-04-20 DIAGNOSIS — Z20828 Contact with and (suspected) exposure to other viral communicable diseases: Secondary | ICD-10-CM

## 2016-04-20 DIAGNOSIS — Z79899 Other long term (current) drug therapy: Secondary | ICD-10-CM

## 2016-04-20 DIAGNOSIS — K429 Umbilical hernia without obstruction or gangrene: Secondary | ICD-10-CM

## 2016-04-20 DIAGNOSIS — R220 Localized swelling, mass and lump, head: Secondary | ICD-10-CM

## 2016-04-20 DIAGNOSIS — F209 Schizophrenia, unspecified: Secondary | ICD-10-CM

## 2016-04-20 MED ORDER — NYSTATIN 100000 UNIT/GM EX CREA: 1.0000 "application " | TOPICAL_CREAM | Freq: Two times a day (BID) | CUTANEOUS | 3 refills | Status: DC

## 2016-04-20 MED ORDER — DICLOFENAC SODIUM 1 % TD GEL: 4.0000 g | Freq: Four times a day (QID) | TRANSDERMAL | 2 refills | Status: DC

## 2016-04-20 NOTE — Assessment & Plan Note (Signed)
Patient is presenting with painless right-sided facial swelling. Etiology currently unknown. Differential includes parotid gland obstruction/infection, dental abscess, lymphadenopathy, neoplasm, angioedema, or idiopathic. At this time there are no symptomatic changes of concern. Advised to watch and wait. - We will monitor. - Consider ultrasound if swelling worsens or does not resolve within the next 1-2 months.

## 2016-04-20 NOTE — Assessment & Plan Note (Signed)
Hernia seems to be worsening. Patient was seen by general surgery who advised against surgical correction due to her comorbidities. Son was very frustrated with this and was asking my opinion. I informed him that I agreed that as long as patient is not having significant abdominal pain and there was less risk for significant complications due to her worsening ventral hernia. I also agree that patient is a high risk surgical candidate and may not survive a complicated surgical procedure. - Reassurance provided - We'll discuss and monitor with patient and family again in the future.

## 2016-04-20 NOTE — Progress Notes (Signed)
HPI  CC: Medication check, facial swelling, and vaginal odor. Patient is here for medication check. She is joined today by her son. They state that she has been very compliant with all her medications at this time. Denies any signs or symptoms of hypotension, orthostatic symptoms, or adverse effects. We discussed her daily medication regimen and made sure her list in our system was accurate.  Facial swelling: Her son states that he had noticed some right-sided facial swelling over the past couple weeks. Patient states that there is not significant discomfort but the area feels "full". No oral pain or discomfort with chewing/swallowing. No recent fevers. No pain or tenderness with sour foods. She does not have any issues breathing. No cough. Slight rhinorrhea. No hearing loss or TMJ pain.  Vaginal odor: Her son is reporting significant worsening vaginal odor. He states that he is the one who regularly bathes her and has noticed a worsening odor. He also states that he has noticed some occasional vaginal bleeding. Patient endorses both of these symptoms but states that she does not have any issues with it at this time. She endorses some vaginal itching. No significant pain. No dysuria or fever.  Review of Systems See HPI for ROS.   CC, SH/smoking status, and VS noted  Objective: BP (!) 162/86   Pulse 86   Temp 98.5 F (36.9 C) (Oral)   Ht 5\' 6"  (1.676 m)   Wt 293 lb 9.6 oz (133.2 kg)   SpO2 98%   BMI 47.39 kg/m  Gen: NAD, alert, cooperative, and pleasant. Tremor and tardive dyskinesia at baseline. HEENT: NCAT, EOMI, PERRL, MMM, TMs clear bilaterally, right-sided facial swelling noted at the mandibular angle toward the TMJ, site is nontender. Neck full ROM. No LAD CV: RRR, no murmur Resp: CTAB, no wheezes, non-labored Abd: Morbidly obese, SNTND, BS present, no guarding or organomegaly, central mass noted to palpation, difficult to fully assess due to obesity GU: unable to examine 2/2  body habitus and mobility limitations.   Assessment and plan:  Right facial swelling Patient is presenting with painless right-sided facial swelling. Etiology currently unknown. Differential includes parotid gland obstruction/infection, dental abscess, lymphadenopathy, neoplasm, angioedema, or idiopathic. At this time there are no symptomatic changes of concern. Advised to watch and wait. - We will monitor. - Consider ultrasound if swelling worsens or does not resolve within the next 1-2 months.  DUB (dysfunctional uterine bleeding) Patient is endorsing vaginal odor and occasional vaginal bleeding. She has had this complaint in the past and is I had placed a referral to OB/GYN, which her son states she refused to go because she didn't feel like anything was wrong. I spent a significant amount of time discussing this with patient and her son as I feel as though it is very important that she has this issue assessed in the near future. Vaginal bleeding at age 71 has me concerned for endometrial cancer. - Referral to OB/GYN placed. Strongly encouraged patient and son to make appointment.  Umbilical hernia Hernia seems to be worsening. Patient was seen by general surgery who advised against surgical correction due to her comorbidities. Son was very frustrated with this and was asking my opinion. I informed him that I agreed that as long as patient is not having significant abdominal pain and there was less risk for significant complications due to her worsening ventral hernia. I also agree that patient is a high risk surgical candidate and may not survive a complicated surgical procedure. - Reassurance  provided - We'll discuss and monitor with patient and family again in the future.   Orders Placed This Encounter  Procedures  . Comprehensive metabolic panel    Order Specific Question:   Has the patient fasted?    Answer:   No  . TSH  . CBC  . Hepatitis C antibody  . Ambulatory referral to  Gynecology    Referral Priority:   Routine    Referral Type:   Consultation    Referral Reason:   Specialty Services Required    Requested Specialty:   Gynecology    Number of Visits Requested:   1    Meds ordered this encounter  Medications  . divalproex (DEPAKOTE ER) 250 MG 24 hr tablet    Sig: Take 250 mg by mouth daily.  . diclofenac sodium (VOLTAREN) 1 % GEL    Sig: Apply 4 g topically 4 (four) times daily.    Dispense:  200 g    Refill:  2  . nystatin cream (MYCOSTATIN)    Sig: Apply 1 application topically 2 (two) times daily.    Dispense:  30 g    Refill:  [place], MD,MS,  PGY3 04/20/2016 12:15 PM

## 2016-04-20 NOTE — Assessment & Plan Note (Signed)
Patient is endorsing vaginal odor and occasional vaginal bleeding. She has had this complaint in the past and is I had placed a referral to OB/GYN, which her son states she refused to go because she didn't feel like anything was wrong. I spent a significant amount of time discussing this with patient and her son as I feel as though it is very important that she has this issue assessed in the near future. Vaginal bleeding at age 71 has me concerned for endometrial cancer. - Referral to OB/GYN placed. Strongly encouraged patient and son to make appointment.

## 2016-04-21 LAB — COMPREHENSIVE METABOLIC PANEL
ALT: 13 IU/L (ref 0–32)
AST: 21 IU/L (ref 0–40)
Albumin/Globulin Ratio: 1.4 (ref 1.2–2.2)
Albumin: 4 g/dL (ref 3.5–4.8)
Alkaline Phosphatase: 85 IU/L (ref 39–117)
BUN/Creatinine Ratio: 22 (ref 12–28)
BUN: 24 mg/dL (ref 8–27)
Bilirubin Total: <0.2 mg/dL (ref 0.0–1.2)
CALCIUM: 9.8 mg/dL (ref 8.7–10.3)
CO2: 21 mmol/L (ref 18–29)
CREATININE: 1.11 mg/dL — AB (ref 0.57–1.00)
Chloride: 99 mmol/L (ref 96–106)
GFR calc Af Amer: 57 mL/min/{1.73_m2} — ABNORMAL LOW (ref >59)
GFR, EST NON AFRICAN AMERICAN: 49 mL/min/{1.73_m2} — AB (ref >59)
Globulin, Total: 2.9 g/dL (ref 1.5–4.5)
Glucose: 158 mg/dL — ABNORMAL HIGH (ref 65–99)
Potassium: 5.1 mmol/L (ref 3.5–5.2)
Sodium: 142 mmol/L (ref 134–144)
Total Protein: 6.9 g/dL (ref 6.0–8.5)

## 2016-04-21 LAB — CBC
Hematocrit: 38.1 % (ref 34.0–46.6)
Hemoglobin: 12.7 g/dL (ref 11.1–15.9)
MCH: 29.3 pg (ref 26.6–33.0)
MCHC: 33.3 g/dL (ref 31.5–35.7)
MCV: 88 fL (ref 79–97)
PLATELETS: 280 10*3/uL (ref 150–379)
RBC: 4.33 x10E6/uL (ref 3.77–5.28)
RDW: 14.4 % (ref 12.3–15.4)
WBC: 11.9 10*3/uL — AB (ref 3.4–10.8)

## 2016-04-21 LAB — TSH: TSH: 4.62 u[IU]/mL — ABNORMAL HIGH (ref 0.450–4.500)

## 2016-04-21 LAB — HEPATITIS C ANTIBODY: Hep C Virus Ab: <0.1 s/co ratio (ref 0.0–0.9)

## 2016-04-24 ENCOUNTER — Encounter: Payer: Self-pay | Admitting: Obstetrics & Gynecology

## 2016-05-10 ENCOUNTER — Other Ambulatory Visit: Payer: Self-pay | Admitting: Family Medicine

## 2016-05-10 ENCOUNTER — Telehealth: Payer: Self-pay | Admitting: Family Medicine

## 2016-05-10 DIAGNOSIS — R7989 Other specified abnormal findings of blood chemistry: Secondary | ICD-10-CM

## 2016-05-10 NOTE — Telephone Encounter (Signed)
Done

## 2016-05-10 NOTE — Telephone Encounter (Signed)
Pt's TSH noted to be elevated. It does not look like from notes, that she was symptomatic.  Will try to have lab add on free T3 and free T4 to evaluate for subclinical hypothyroidism.  Archie Patten, MD [facility] Family Medicine Resident  05/10/2016, 4:43 PM

## 2016-05-22 ENCOUNTER — Encounter: Payer: Medicare Other | Admitting: Obstetrics & Gynecology

## 2016-05-23 ENCOUNTER — Telehealth: Payer: Self-pay | Admitting: Family Medicine

## 2016-05-23 NOTE — Telephone Encounter (Signed)
Pt is requesting her blood work results to be sent to Dr Jobie Quaker at [facility]

## 2016-05-24 ENCOUNTER — Ambulatory Visit (INDEPENDENT_AMBULATORY_CARE_PROVIDER_SITE_OTHER): Payer: Medicare Other | Admitting: Cardiovascular Disease

## 2016-05-24 ENCOUNTER — Encounter: Payer: Self-pay | Admitting: Cardiovascular Disease

## 2016-05-24 ENCOUNTER — Ambulatory Visit (HOSPITAL_COMMUNITY)
Admission: RE | Admit: 2016-05-24 | Discharge: 2016-05-24 | Disposition: A | Discharge status: Home / Self Care | Payer: Medicare Other | Source: Ambulatory Visit | Attending: Cardiovascular Disease | Admitting: Cardiovascular Disease

## 2016-05-24 ENCOUNTER — Encounter (HOSPITAL_COMMUNITY): Payer: Self-pay

## 2016-05-24 VITALS — BP 130/82 | HR 77 | Ht 66.0 in | Wt 294.6 lb

## 2016-05-24 DIAGNOSIS — I10 Essential (primary) hypertension: Secondary | ICD-10-CM

## 2016-05-24 DIAGNOSIS — I442 Atrioventricular block, complete: Secondary | ICD-10-CM

## 2016-05-24 DIAGNOSIS — F17200 Nicotine dependence, unspecified, uncomplicated: Secondary | ICD-10-CM | POA: Diagnosis not present

## 2016-05-24 DIAGNOSIS — Z79899 Other long term (current) drug therapy: Secondary | ICD-10-CM

## 2016-05-24 DIAGNOSIS — Z950 Presence of cardiac pacemaker: Secondary | ICD-10-CM | POA: Diagnosis not present

## 2016-05-24 DIAGNOSIS — R600 Localized edema: Secondary | ICD-10-CM | POA: Diagnosis not present

## 2016-05-24 DIAGNOSIS — I495 Sick sinus syndrome: Secondary | ICD-10-CM | POA: Diagnosis not present

## 2016-05-24 DIAGNOSIS — I5032 Chronic diastolic (congestive) heart failure: Secondary | ICD-10-CM | POA: Diagnosis not present

## 2016-05-24 DIAGNOSIS — E785 Hyperlipidemia, unspecified: Secondary | ICD-10-CM

## 2016-05-24 NOTE — Patient Instructions (Signed)
Medication Instructions: Dr Sallyanne Kuster recommends that you continue on your current medications as directed. Please refer to the Current Medication list given to you today.  Labwork: Your physician recommends that you return for lab work in at your earliest [place].  Testing/Procedures: 1. Lower Extremity Venous Doppler - Your physician has requested that you have a lower or upper extremity venous duplex. This test is an ultrasound of the veins in the legs or arms. It looks at venous blood flow that carries blood from the heart to the legs or arms. Allow one hour for a Lower Venous exam. Allow thirty minutes for an Upper Venous exam. There are no restrictions or special instructions.  2. Remote Pacemaker Transmission - Remote monitoring is used to monitor your Pacemaker or ICD from home. This monitoring reduces the number of office visits required to check your device to one time per year. It allows [place] to keep an eye on the functioning of your device to ensure it is working properly. You are scheduled for a device check from home on Thursday, August 9th, 2018. You may send your transmission at any time that day. If you have a wireless device, the transmission will be sent automatically. After your physician reviews your transmission, you will receive a notification with your next transmission date.  Follow-up: Dr Sallyanne Kuster recommends that you schedule a follow-up appointment in 12 months with a pacemaker check. You will receive a reminder letter in the mail two months in advance. If you don't receive a letter, please call our office to schedule the follow-up appointment.  If you need a refill on your cardiac medications before your next appointment, please call your pharmacy.

## 2016-05-24 NOTE — Progress Notes (Signed)
Patient ID: Abigail Wallace, female   DOB: [birth year], 71 y.o.   MRN: [id number]    Cardiology Office Note    Date:  05/24/2016   ID:  [address] [place], [place] [birth year], MRN [id number]  PCP:  Elberta Leatherwood, MD  Cardiologist:   Abigail Klein, MD   Chief Complaint  Patient presents with  . Pacemaker Check    Chronic diastolic HF    History of Present Illness:  Abigail Wallace is a 71 y.o. female with complete heart block and sinus node dysfunction who presents for a pacemaker check. As always, her son Abigail Wallace accompanies her today.  She denies chest pain but complains relatively frequently of shortness of breath. The episodes of dyspnea happen randomly at rest. She has started smoking again, roughly 6 cigarettes a day. She has occasional wheezing. She is extremely sedentary. Abigail Wallace says she hasn't really walked in 18 months. On 2 separate occasions he tried to initiate physical therapy, but she declined on one occasion and not really cooperate on the second.  Recently she has developed asymmetrical left calf swelling. The leg is not tender. She denies cough or hemoptysis.  She denies angina pectoris, syncope, palpitations. She has not had orthopnea. As always she is very sedentary, so it is hard to assess whether she has any dyspnea. She has not had falls or bleeding.  Interrogation of her pacemaker today shows 77% atrial pacing and 100% ventricular pacing with estimated generator longevity of another 10.5 years. She had occasional nonsustained ventricular tachycardia last year, none has been recorded since October 2017. Presenting rhythm today is AV sequential pacing.  Her pacemaker is a dual-chamber Medtronic Adapta device implanted in August 2016 as a generator change out. She is pacemaker dependent due to complete heart block (she has an idioventricular escape rhythm around 30 bpm). She also has significant sinus bradycardia.   She initially received a dual-chamber permanent pacemaker in 2007 for  symptomatic AV block. She has minimal coronary atherosclerosis by cardiac catheterization performed in 2007 and no evidence of insufficiency by nuclear perfusion testing in 2012. By echocardiography she has normal left ventricular size and systolic function and no major structural cardiac abnormalities.   Noncardiac history includes chronic back pain, obesity and orthostatic hypotension. She smokes roughly 5 cigarettes a day and has been diagnosed with COPD, but I don't think this has been formally documented. There is also a mention of a diagnosis of obstructive sleep apnea in her chart. Use CPAP. She has a history of schizophrenia with psychosis requiring hospitalization in 2012. Since then, she has done well and lives independently. Her son, Abigail Wallace ,accompanies her to most visits.   Past Medical History:  Diagnosis Date  . COPD (chronic obstructive pulmonary disease) ([place])   . Coronary artery disease    NON-CRITICAL  . Cystitis    Frequent episodes.   Marland Kitchen GERD (gastroesophageal reflux disease)   . Gout   . Hypertension   . Memory difficulties 12/02/2013  . Mental disorder   . Psychosis   . Tardive dyskinesia 12/02/2013    Past Surgical History:  Procedure Laterality Date  . CARDIAC CATHETERIZATION  2007   Non-critical.   . CHOLECYSTECTOMY    . EP IMPLANTABLE DEVICE N/A 08/31/2014   Procedure: PPM Generator Changeout;  Surgeon: Abigail Klein, MD;  Location: [facility];  Service: Cardiovascular;  Laterality: N/A;  . INSERT / REPLACE / REMOVE PACEMAKER  2007   Opp/SYMPTOMATIC HEART BLOCK  . PACEMAKER PLACEMENT  09/28/2005   2/2 SSS?  . Persantine stress test  05/01/2010   EF 66%. Normal LV sys fx. Unchanged from previous studies.   . TRANSTHORACIC ECHOCARDIOGRAM  09/08/10   SEVERE CONCENTRIC HYPERTROPHY.LV FUNCTION WAS VIGOROUS.EF 65%-70%.VENTRICULAR SEPTUM-INCOORDINATE MOTION.LEFT ATRIUM-MILDLY DILATED.TRIVIAL TR.  . TUBAL LIGATION      Outpatient Medications Prior to  Visit  Medication Sig Dispense Refill  . acetaminophen (TYLENOL) 500 MG tablet Take 2,000 mg by mouth at bedtime.     Marland Kitchen allopurinol (ZYLOPRIM) 100 MG tablet take 1 tablet by mouth once daily 90 tablet 3  . diclofenac sodium (VOLTAREN) 1 % GEL Apply 4 g topically 4 (four) times daily. 200 g 2  . divalproex (DEPAKOTE ER) 250 MG 24 hr tablet Take 250 mg by mouth daily.    . hydrochlorothiazide (MICROZIDE) 12.5 MG capsule take 2 capsules by mouth once daily 60 capsule 6  . Incontinence Supplies MISC 1 Units by Does not apply route as needed. 100 each prn  . losartan (COZAAR) 100 MG tablet take 1 tablet by mouth once daily 90 tablet 2  . metoprolol (LOPRESSOR) 50 MG tablet take 1 tablet by mouth twice a day 60 tablet 2  . metoprolol (LOPRESSOR) 50 MG tablet take 1 tablet by mouth twice a day 60 tablet 0  . nitroGLYCERIN (NITROSTAT) 0.4 MG SL tablet Place 1 tablet (0.4 mg total) under the tongue every 5 (five) minutes x 3 doses as needed. For chest 25 tablet 1  . nystatin cream (MYCOSTATIN) Apply 1 application topically 2 (two) times daily. 30 g 3  . OLANZapine (ZYPREXA) 10 MG tablet Take 10 mg by mouth at bedtime.  0  . pravastatin (PRAVACHOL) 40 MG tablet Take 1 tablet (40 mg total) by mouth every evening. 30 tablet 6  . QUEtiapine (SEROQUEL) 25 MG tablet Take 25 mg by mouth at bedtime.   0   No facility-administered medications prior to visit.      Allergies:   Abilify [aripiprazole]; Aripiprazole; Benadryl [diphenhydramine hcl]; Cogentin [benztropine]; Haloperidol lactate; Latuda [lurasidone hcl]; Remeron [mirtazapine]; Clozapine; Haloperidol lactate; Codeine; and Latex   Social History   Social History  . Marital status: Divorced    Spouse name: N/A  . Number of children: N/A  . Years of education: N/A   Social History Main Topics  . Smoking status: Current Some Day Smoker    Packs/day: 0.25    Types: Cigarettes  . Smokeless tobacco: Never Used     Comment: electronic cigarettes  .  Alcohol use No  . Drug use: No  . Sexual activity: Not Asked   Other Topics Concern  . None   Social History Narrative   Lives alone. Older sister lives nearby and helps take her to appointments and to take care of her; sister uses wheelchair, unsure why (thin and appears healthy).    Married.   Children: 2 daughters, 1 son; 2 GC, 5 GGC.    Occupation: disabled.    Caffeine:   Tobacco: 5-6/day   Denies alcohol.      Family History:  The patient's family history includes Diabetes type II in her son; Heart disease in her father and mother; Kidney disease in her father and mother; Stroke in her sister.   ROS:   Please see the history of present illness.    Review of Systems  Constitution: Negative for chills, decreased appetite and fever.  HENT: Negative for nosebleeds.   Eyes: Negative for visual disturbance.  Cardiovascular: Positive for leg  swelling. Negative for chest pain, irregular heartbeat, orthopnea, palpitations and syncope.  Respiratory: Positive for snoring. Negative for cough, hemoptysis and wheezing.   Endocrine: Negative for cold intolerance, heat intolerance, polydipsia, polyphagia and polyuria.  Hematologic/Lymphatic: Does not bruise/bleed easily.  Skin: Negative for itching and rash.  Musculoskeletal: Positive for arthritis, back pain, joint pain, muscle weakness and neck pain.  Gastrointestinal: Negative for abdominal pain, change in bowel habit, hematemesis, melena, nausea and vomiting.  Genitourinary: Negative for frequency, hematuria, non-menstrual bleeding and urgency.  Neurological: Positive for difficulty with concentration and tremors. Negative for headaches.  Psychiatric/Behavioral: Negative for depression and hallucinations.  Allergic/Immunologic: Negative.    All other systems reviewed and are negative.   PHYSICAL EXAM:   VS:  BP 130/82   Pulse 77   Ht 5\' 6"  (1.676 m)   Wt 133.6 kg (294 lb 9.6 oz)   BMI 47.55 kg/m      General: Alert,  oriented x3, no distress Head: no evidence of trauma, PERRL, EOMI, no exophtalmos or lid lag, no myxedema, no xanthelasma; normal ears, nose and oropharynx Neck: normal jugular venous pulsations and no hepatojugular reflux; brisk carotid pulses without delay and no carotid bruits Chest: A few wheezes especially in the right lung base, no signs of consolidation by percussion or palpation, normal fremitus, symmetrical and full respiratory excursions. Healthy subclavian pacemaker site Cardiovascular: normal position and quality of the apical impulse, regular rhythm, normal first and paradoxically split second heart sounds, no murmurs, rubs or gallops. There is 1+ nonpitting edema of the left calf, clearly asymmetrical. Abdomen: no tenderness or distention, no masses by palpation, no abnormal pulsatility or arterial bruits, normal bowel sounds, no hepatosplenomegaly Extremities: no clubbing, cyanosis; 2+ radial, ulnar and brachial pulses bilaterally; 2+ right femoral, posterior tibial and dorsalis pedis pulses; 2+ left femoral, posterior tibial and dorsalis pedis pulses; no subclavian or femoral bruits Neurological: grossly nonfocal; constant pill-rolling motion of her fingers   Wt Readings from Last 3 Encounters:  05/24/16 133.6 kg (294 lb 9.6 oz)  04/20/16 133.2 kg (293 lb 9.6 oz)  05/16/15 132.9 kg (293 lb)      Studies/Labs Reviewed:   EKG:  EKG is ordered today.  The ekg ordered today demonstrates AV sequential pacing  Recent Labs: 04/20/2016: ALT 13; BUN 24; Creatinine, Ser 1.11; Platelets 280; Potassium 5.1; Sodium 142; TSH 4.620    ASSESSMENT:    1. Dyslipidemia   2. Medication management   3. Lower extremity edema   4. Chronic diastolic heart failure ([place])   5. Pacemaker      PLAN:  In order of problems listed above:  1. CHF: Seems to be close to euvolemia. She has left calf swelling, but her right calf is not edematous, there is no jugular venous distention 2. Asymmetrical  leg edema: Check venous duplex ultrasound today 3. HTN: Adequate control. 4. SSS: She is quite sedentary. Satisfactory heart rate histogram distribution with the current pacemaker sensor settings. 5. CHB: She has a very slow idioventricular escape rhythm. She should be considered pacemaker dependent. 6. PPM: Normal pacemaker function. [facility] downloads every 3 months and office visit in 6 months. 7. HLP: Time to recheck her lipid profile. On pravastatin. 8. Smoking: Strongly encouraged that she quit smoking again. 9. Deconditioning: Severe deconditioning. Recommended physical therapy again. She wants to do it on her own. Begrudgingly committed to 5-10 minutes of standing and walking 3 times a day.  10. Obesity: Discussed the need for some physical activity and reduction  in intake of carbohydrates and calories    Medication Adjustments/Labs and Tests Ordered: Current medicines are reviewed at length with the patient today.  Concerns regarding medicines are outlined above.  Medication changes, Labs and Tests ordered today are listed in the Patient Instructions below. Patient Instructions  Medication Instructions: Dr Sallyanne Kuster recommends that you continue on your current medications as directed. Please refer to the Current Medication list given to you today.  Labwork: Your physician recommends that you return for lab work in at your earliest [place].  Testing/Procedures: 1. Lower Extremity Venous Doppler - Your physician has requested that you have a lower or upper extremity venous duplex. This test is an ultrasound of the veins in the legs or arms. It looks at venous blood flow that carries blood from the heart to the legs or arms. Allow one hour for a Lower Venous exam. Allow thirty minutes for an Upper Venous exam. There are no restrictions or special instructions.  2. Remote Pacemaker Transmission - Remote monitoring is used to monitor your Pacemaker or ICD from home. This  monitoring reduces the number of office visits required to check your device to one time per year. It allows [place] to keep an eye on the functioning of your device to ensure it is working properly. You are scheduled for a device check from home on Thursday, August 9th, 2018. You may send your transmission at any time that day. If you have a wireless device, the transmission will be sent automatically. After your physician reviews your transmission, you will receive a notification with your next transmission date.  Follow-up: Dr Sallyanne Kuster recommends that you schedule a follow-up appointment in 12 months with a pacemaker check. You will receive a reminder letter in the mail two months in advance. If you don't receive a letter, please call our office to schedule the follow-up appointment.  If you need a refill on your cardiac medications before your next appointment, please call your pharmacy.      Signed, Abigail Klein, MD  05/24/2016 1:06 PM    [facility] [address], [place], [place]  [address] Phone: [phone]; Fax: [phone]

## 2016-05-24 NOTE — Progress Notes (Signed)
Today's left lower extremity venous duplex is negative for DVT in the visualized vessels. Preliminary results given to Dr. Sallyanne Kuster.

## 2016-05-25 ENCOUNTER — Telehealth: Payer: Self-pay | Admitting: Cardiovascular Disease

## 2016-05-25 NOTE — Telephone Encounter (Signed)
Abigail Wallace (son) notified he states that he has been trying to get pt to elevate her legs for a year, unsuccessfully-FYI Any other suggestions for this?

## 2016-05-25 NOTE — Telephone Encounter (Signed)
Waiting for Dr C to read

## 2016-05-25 NOTE — Telephone Encounter (Signed)
The report is ready and I put a result note in.  We did not see any clot but did not fully see some of the veins in her leg. She should keep her leg elevated. Call back if still swollen in a week

## 2016-05-25 NOTE — Telephone Encounter (Signed)
New message      Pt was recently seen.  Calling to see what Dr Sallyanne Kuster said about her right lung.  Is there a problem?

## 2016-05-29 ENCOUNTER — Other Ambulatory Visit: Payer: Self-pay | Admitting: Family Medicine

## 2016-05-29 ENCOUNTER — Telehealth: Payer: Self-pay | Admitting: Family Medicine

## 2016-05-29 MED ORDER — CEPHALEXIN 500 MG PO CAPS: 500.0000 mg | ORAL_CAPSULE | Freq: Two times a day (BID) | ORAL | 0 refills | Status: DC

## 2016-05-29 NOTE — Telephone Encounter (Signed)
Son would like PCP to call in antibiotics for pt, believes pt has UTI again. Pt is unable to come in due to transportation issues. Pt uses [facility] on [address] ep

## 2016-06-06 ENCOUNTER — Encounter: Payer: Medicare Other | Admitting: Obstetrics & Gynecology

## 2016-06-08 ENCOUNTER — Telehealth: Payer: Self-pay | Admitting: *Deleted

## 2016-06-08 NOTE — Telephone Encounter (Signed)
Pt called for adult pull-ups, [facility] sent a form to be filled out, dr filled it out and [place] faxed it back. Deseree Kennon Holter, CMA

## 2016-06-13 NOTE — Telephone Encounter (Signed)
Unable to fax information without a signed release of Information, left message for patient.

## 2016-07-03 DIAGNOSIS — F200 Paranoid schizophrenia: Secondary | ICD-10-CM | POA: Diagnosis not present

## 2016-07-11 ENCOUNTER — Telehealth: Payer: Self-pay | Admitting: Family Medicine

## 2016-07-11 NOTE — Telephone Encounter (Signed)
Son is calling because his mother has a history of bladder infections and would like the doctor to call in something for this. Dr. Alease Frame is aware of the situation and has called this in in the past without seeing the patient. If you have any questions please call. jw

## 2016-07-11 NOTE — Telephone Encounter (Signed)
I am aware but I need more information other than "bladder infection". Can we please get more information from these patients before assuming I'm going to write a Rx.  No Rx sent today.

## 2016-07-11 NOTE — Telephone Encounter (Signed)
Per patient and son, she has had about a 4 day history of dysuria, with some blood in urine and chills. States they use [facility] on [address].

## 2016-07-13 ENCOUNTER — Other Ambulatory Visit: Payer: Self-pay | Admitting: Family Medicine

## 2016-07-13 MED ORDER — CEPHALEXIN 500 MG PO CAPS: 500.0000 mg | ORAL_CAPSULE | Freq: Two times a day (BID) | ORAL | 0 refills | Status: DC

## 2016-07-13 NOTE — Telephone Encounter (Signed)
7 days Keflex BID ordered. Please inform patient/family.  Also, patient will need to follow up w/ [place] within the next 1-2 wks. These recurrent UTIs may be preventable w/ intravaginal estrogen if there is evidence of vaginal atrophy.

## 2016-07-13 NOTE — Telephone Encounter (Signed)
Patient informed, expressed understanding. 

## 2016-07-26 ENCOUNTER — Other Ambulatory Visit: Payer: Self-pay

## 2016-07-26 ENCOUNTER — Telehealth: Payer: Self-pay | Admitting: Cardiovascular Disease

## 2016-07-26 MED ORDER — ALLOPURINOL 100 MG PO TABS: 100.0000 mg | ORAL_TABLET | Freq: Every day | ORAL | 0 refills | Status: DC

## 2016-07-26 NOTE — Telephone Encounter (Signed)
REFILL ALREADY SENT TODAY

## 2016-07-26 NOTE — Telephone Encounter (Signed)
New message     *STAT* If patient is at the pharmacy, call can be transferred to refill team.   1. Which medications need to be refilled? (please list name of each medication and dose if known) allopurinol (ZYLOPRIM) 100 MG tablet  2. Which pharmacy/location (including street and city if local pharmacy) is medication to be sent to? [facility]  3. Do they need a 30 day or 90 day supply? 90 day supply

## 2016-07-26 NOTE — Telephone Encounter (Signed)
Rx(s) sent to pharmacy electronically.  Per [facility] - #30-0 refills - MUST DEFER FUTURE REFILLS TO PCP.

## 2016-08-23 ENCOUNTER — Ambulatory Visit (INDEPENDENT_AMBULATORY_CARE_PROVIDER_SITE_OTHER): Payer: Medicare Other | Admitting: *Deleted

## 2016-08-23 DIAGNOSIS — I495 Sick sinus syndrome: Secondary | ICD-10-CM

## 2016-08-23 NOTE — Progress Notes (Signed)
Remote pacemaker transmission.   

## 2016-09-07 ENCOUNTER — Encounter: Payer: Self-pay | Admitting: Cardiology

## 2016-09-10 ENCOUNTER — Other Ambulatory Visit: Payer: Self-pay | Admitting: Cardiovascular Disease

## 2016-09-10 ENCOUNTER — Telehealth: Payer: Self-pay | Admitting: Cardiovascular Disease

## 2016-09-10 NOTE — Telephone Encounter (Signed)
Rx request sent to pharmacy.  

## 2016-09-10 NOTE — Telephone Encounter (Signed)
°*  STAT* If patient is at the pharmacy, call can be transferred to refill team.   1. Which medications need to be refilled? (please list name of each medication and dose if known) Allopurinol 100 mg,  Metoprolol 50 mg  2. Which pharmacy/location (including street and city if local pharmacy) is medication to be sent to? [place] [address]  3. Do they need a 30 day or 90 day supply? Allopurinol (30)  Metoprolol (60)

## 2016-09-11 ENCOUNTER — Encounter: Payer: Self-pay | Admitting: Family Medicine

## 2016-09-11 ENCOUNTER — Ambulatory Visit (INDEPENDENT_AMBULATORY_CARE_PROVIDER_SITE_OTHER): Payer: Medicare Other | Admitting: Family Medicine

## 2016-09-11 ENCOUNTER — Other Ambulatory Visit: Payer: Self-pay

## 2016-09-11 VITALS — BP 164/78 | HR 80 | Temp 98.9°F | Ht 66.0 in | Wt 292.0 lb

## 2016-09-11 DIAGNOSIS — E039 Hypothyroidism, unspecified: Secondary | ICD-10-CM

## 2016-09-11 DIAGNOSIS — I890 Lymphedema, not elsewhere classified: Secondary | ICD-10-CM | POA: Diagnosis not present

## 2016-09-11 DIAGNOSIS — K429 Umbilical hernia without obstruction or gangrene: Secondary | ICD-10-CM

## 2016-09-11 DIAGNOSIS — R7989 Other specified abnormal findings of blood chemistry: Secondary | ICD-10-CM

## 2016-09-11 DIAGNOSIS — R946 Abnormal results of thyroid function studies: Secondary | ICD-10-CM | POA: Diagnosis not present

## 2016-09-11 DIAGNOSIS — M1A9XX0 Chronic gout, unspecified, without tophus (tophi): Secondary | ICD-10-CM | POA: Diagnosis not present

## 2016-09-11 DIAGNOSIS — F17200 Nicotine dependence, unspecified, uncomplicated: Secondary | ICD-10-CM | POA: Diagnosis not present

## 2016-09-11 DIAGNOSIS — I10 Essential (primary) hypertension: Secondary | ICD-10-CM

## 2016-09-11 LAB — CUP PACEART REMOTE DEVICE CHECK
Battery Impedance: 134 Ohm
Battery Remaining Longevity: 122 mo
Battery Voltage: 2.78 V
Date Time Interrogation Session: 20180809172956
Implantable Lead Implant Date: 20070914
Implantable Lead Location: 753860
Implantable Lead Model: 4092
Implantable Pulse Generator Implant Date: 20160816
Lead Channel Setting Pacing Amplitude: 2.5 V
Lead Channel Setting Pacing Pulse Width: 0.4 ms
Lead Channel Setting Sensing Sensitivity: 4 mV
MDC IDC LEAD IMPLANT DT: 20070414
MDC IDC LEAD LOCATION: 753859
MDC IDC MSMT LEADCHNL RA IMPEDANCE VALUE: 480 Ohm
MDC IDC MSMT LEADCHNL RA PACING THRESHOLD AMPLITUDE: 0.75 V
MDC IDC MSMT LEADCHNL RA PACING THRESHOLD PULSEWIDTH: 0.4 ms
MDC IDC MSMT LEADCHNL RV IMPEDANCE VALUE: 617 Ohm
MDC IDC MSMT LEADCHNL RV PACING THRESHOLD AMPLITUDE: 0.875 V
MDC IDC MSMT LEADCHNL RV PACING THRESHOLD PULSEWIDTH: 0.4 ms
MDC IDC SET LEADCHNL RA PACING AMPLITUDE: 2 V
MDC IDC STAT BRADY AP VP PERCENT: 54 %
MDC IDC STAT BRADY AP VS PERCENT: 0 %
MDC IDC STAT BRADY AS VP PERCENT: 46 %
MDC IDC STAT BRADY AS VS PERCENT: 0 %

## 2016-09-11 NOTE — Assessment & Plan Note (Addendum)
Chronic. There is a smoker. Smoking 6 cigarettes per day. Not interested in cessation. --Will continue to encourage patient to stop given multiple comorbidities

## 2016-09-11 NOTE — Patient Instructions (Signed)
Thank you for coming in to see [place] today. Please see below to review our plan for today's visit.  1. I will call you if your lab results are abnormal, otherwise expect to receive them in the mail. 2. I would like to see you in the clinic in one month to go over her medical problems and other health maintenance that we have not addressed yet. In the meantime please consider getting her colonoscopy.  Please call the clinic at [phone] if your symptoms worsen or you have any concerns. It was my pleasure to see you. -- Harriet Butte, [place], PGY-2

## 2016-09-11 NOTE — Assessment & Plan Note (Addendum)
Chronic. Evaluated by Dr. Ninfa Linden who stated patient is not stable to undergo surgery. Patient is a poor surgical candidate. No signs of incarceration. --Will continue to monitor

## 2016-09-11 NOTE — Assessment & Plan Note (Addendum)
Chronic. Suboptimal control. Current every day smoker. BP controlled on prior visits. --Will obtain CBC with differential, CMET, thyroid studies --Would consider adding amlodipine if remains elevated at next visit, would discourage increasing HCTZ 12.5 mg daily given history of gout, continue Lopressor 50 mg daily --RTC in 1 month

## 2016-09-11 NOTE — Assessment & Plan Note (Addendum)
Chronic. No recent flares. On allopurinol. Also taking HCTZ though blood pressure remains suboptimal. No uric acid level by chart review. --Will obtain uric acid level --Continue HCTZ for now given no acute flares and poor blood pressure control --Continue allopurinol 100 mg daily

## 2016-09-11 NOTE — Assessment & Plan Note (Addendum)
Uncertain chronicity. TSH elevated minimally 4 months ago. T3 and T4 labs ordered, however, performed at follow-up. --Will obtain T3 and T4 and treat as appropriate

## 2016-09-11 NOTE — Progress Notes (Signed)
Subjective:   Patient ID: [name]    DOB: 1945/07/11, 71 y.o. female   MRN: [id number]  CC: "Establish care"  HPI: Kareen Jefferys is a 71 y.o. female who presents to clinic today to establish care. Problems discussed today are as follows:  Hypertension: Patient took blood pressure medications this morning. Continues to smoke, not interested in cessation.  Hypothyroidism: TSH slightly elevated at last visit. Thyroid levels ordered, however patient was unaware that she was to get these lab studies. ROS: No cold intolerance, constipation, worsening fatigue.  Gout: Patient taking allopurinol daily. No recent flares in the past several years. Patient's son is concerned given her HCTZ use for blood pressure.  Umbilical hernia: Was evaluated by Dr. Ninfa Linden who discussed with patient and son that she is a poor surgical candidate. Son understands the situation. ROS: No change in bowel movements, denies melena or hematochezia, nausea or vomiting.  Lymphedema: Left leg chronically edematous according to son and patient. No change from baseline. Son is asking what to look for that would be concerning. Patient is endorsing use of blood pressure medications as prescribed. ROS: Denies chest pain, shortness of breath, orthopnea.  Complete ROS performed, see HPI for pertinent.  [place]: HTN, HFpEF, CAD, complete heart block with pacemaker, hypothyroidism, COPD, OSA with CPAP, ventral hernia, gout. Surgical history pacemaker placement, cardiac cath (2007), cholecystectomy, tubal ligation. Family history heart disease, CKD, stroke, DM. Smoking status reviewed. Medications reviewed.  Objective:   BP (!) 164/78   Pulse 80   Temp 98.9 F (37.2 C) (Oral)   Ht _0  (1.676 m)   Wt 292 lb (132.5 kg)   BMI 47.13 kg/m  Vitals and nursing note reviewed.  General: well nourished, well developed, in no acute distress with non-toxic appearance HEENT: normocephalic, atraumatic, moist mucous  membranes Neck: supple, non-tender without lymphadenopathy, no JVD CV: regular rate and rhythm without murmurs, rubs, or gallops, no lower extremity edema Lungs: clear to auscultation bilaterally with normal work of breathing Abdomen: soft, non-tender, non-distended, no masses or organomegaly palpable, normoactive bowel sounds Skin: warm, dry, no rashes or lesions, cap refill < 2 seconds Extremities: warm and well perfused, normal tone  Assessment & Plan:   Hypothyroidism Uncertain chronicity. TSH elevated minimally 4 months ago. T3 and T4 labs ordered, however, performed at follow-up. --Will obtain T3 and T4 and treat as appropriate  Umbilical hernia Chronic. Evaluated by Dr. Ninfa Linden who stated patient is not stable to undergo surgery. Patient is a poor surgical candidate. No signs of incarceration. --Will continue to monitor  Gout Chronic. No recent flares. On allopurinol. Also taking HCTZ though blood pressure remains suboptimal. No uric acid level by chart review. --Will obtain uric acid level --Continue HCTZ for now given no acute flares and poor blood pressure control --Continue allopurinol 100 mg daily  Tobacco use disorder Chronic. There is a smoker. Smoking 6 cigarettes per day. Not interested in cessation. --Will continue to encourage patient to stop given multiple comorbidities  Lymphedema of left leg Chronic. At baseline according to patient and son. No signs of fluid overload by exam. No signs of DVT. --Continue current diuretic plan  Primary hypertension Chronic. Suboptimal control. Current every day smoker. BP controlled on prior visits. --Will obtain CBC with differential, CMET, thyroid studies --Would consider adding amlodipine if remains elevated at next visit, would discourage increasing HCTZ 12.5 mg daily given history of gout, continue Lopressor 50 mg daily --RTC in 1 month  Orders Placed This Encounter  Procedures  . CBC with Differential/Platelet  .  CMP14+EGFR  . Uric Acid  . T3, Free  . T4, Free   No orders of the defined types were placed in this encounter.   Harriet Butte, [place], PGY-2 09/11/2016 6:08 PM

## 2016-09-11 NOTE — Assessment & Plan Note (Addendum)
Chronic. At baseline according to patient and son. No signs of fluid overload by exam. No signs of DVT. --Continue current diuretic plan

## 2016-09-11 NOTE — Telephone Encounter (Signed)
error 

## 2016-09-12 ENCOUNTER — Encounter: Payer: Self-pay | Admitting: Family Medicine

## 2016-09-12 ENCOUNTER — Encounter: Payer: Self-pay | Admitting: *Deleted

## 2016-09-12 ENCOUNTER — Other Ambulatory Visit: Payer: Self-pay | Admitting: Cardiovascular Disease

## 2016-09-12 ENCOUNTER — Telehealth: Payer: Self-pay | Admitting: Family Medicine

## 2016-09-12 DIAGNOSIS — N183 Chronic kidney disease, stage 3 (moderate): Secondary | ICD-10-CM

## 2016-09-12 DIAGNOSIS — N1832 Chronic kidney disease, stage 3b: Secondary | ICD-10-CM | POA: Insufficient documentation

## 2016-09-12 LAB — T3, FREE: T3 FREE: 2.4 pg/mL (ref 2.0–4.4)

## 2016-09-12 LAB — CBC WITH DIFFERENTIAL/PLATELET
BASOS ABS: 0.1 10*3/uL (ref 0.0–0.2)
Basos: 1 %
EOS (ABSOLUTE): 0.3 10*3/uL (ref 0.0–0.4)
EOS: 2 %
HEMATOCRIT: 36.7 % (ref 34.0–46.6)
Hemoglobin: 11.8 g/dL (ref 11.1–15.9)
IMMATURE GRANULOCYTES: 1 %
Immature Grans (Abs): 0.2 10*3/uL — ABNORMAL HIGH (ref 0.0–0.1)
Lymphocytes Absolute: 4.7 10*3/uL — ABNORMAL HIGH (ref 0.7–3.1)
Lymphs: 35 %
MCH: 28.4 pg (ref 26.6–33.0)
MCHC: 32.2 g/dL (ref 31.5–35.7)
MCV: 88 fL (ref 79–97)
MONOS ABS: 0.7 10*3/uL (ref 0.1–0.9)
Monocytes: 5 %
NEUTROS PCT: 56 %
Neutrophils Absolute: 7.4 10*3/uL — ABNORMAL HIGH (ref 1.4–7.0)
PLATELETS: 305 10*3/uL (ref 150–379)
RBC: 4.16 x10E6/uL (ref 3.77–5.28)
RDW: 14.4 % (ref 12.3–15.4)
WBC: 13.3 10*3/uL — AB (ref 3.4–10.8)

## 2016-09-12 LAB — CMP14+EGFR
ALK PHOS: 84 IU/L (ref 39–117)
ALT: 16 IU/L (ref 0–32)
AST: 24 IU/L (ref 0–40)
Albumin/Globulin Ratio: 1.8 (ref 1.2–2.2)
Albumin: 4.2 g/dL (ref 3.5–4.8)
BUN/Creatinine Ratio: 21 (ref 12–28)
BUN: 25 mg/dL (ref 8–27)
Bilirubin Total: 0.2 mg/dL (ref 0.0–1.2)
CALCIUM: 10.2 mg/dL (ref 8.7–10.3)
CO2: 21 mmol/L (ref 20–29)
CREATININE: 1.21 mg/dL — AB (ref 0.57–1.00)
Chloride: 102 mmol/L (ref 96–106)
GFR calc Af Amer: 51 mL/min/{1.73_m2} — ABNORMAL LOW (ref >59)
GFR, EST NON AFRICAN AMERICAN: 44 mL/min/{1.73_m2} — AB (ref >59)
GLUCOSE: 133 mg/dL — AB (ref 65–99)
Globulin, Total: 2.4 g/dL (ref 1.5–4.5)
Potassium: 4.7 mmol/L (ref 3.5–5.2)
Sodium: 140 mmol/L (ref 134–144)
Total Protein: 6.6 g/dL (ref 6.0–8.5)

## 2016-09-12 LAB — URIC ACID: URIC ACID: 6.3 mg/dL (ref 2.5–7.1)

## 2016-09-12 LAB — T4, FREE: FREE T4: 0.92 ng/dL (ref 0.82–1.77)

## 2016-09-12 NOTE — Telephone Encounter (Signed)
PCP please. OK to give #30, no RF if she cannot reach him/her

## 2016-09-12 NOTE — Telephone Encounter (Signed)
Do you want to refill, or defer to PCP?

## 2016-09-12 NOTE — Telephone Encounter (Signed)
Contacted pt and son per pts request concerning lab results. Creatinine has gradually been elevating over past few years. Uncertain cause. Added CKDIIIb to problem list. Will continue to monitor. Pt and son w/o questions. -- Harriet Butte, [place], PGY-2

## 2016-09-12 NOTE — Telephone Encounter (Signed)
This encounter was created in error - please disregard.

## 2016-09-12 NOTE — Telephone Encounter (Signed)
REFILL 

## 2016-09-12 NOTE — Telephone Encounter (Signed)
New message      *STAT* If patient is at the pharmacy, call can be transferred to refill team.   1. Which medications need to be refilled? (please list name of each medication and dose if known)  Allopurinol 100   2. Which pharmacy/location (including street and city if local pharmacy) is medication to be sent to? [facility]   3. Do they need a 30 day or 90 day supply? 90 if possible

## 2016-09-14 ENCOUNTER — Other Ambulatory Visit: Payer: Self-pay | Admitting: Cardiovascular Disease

## 2016-09-18 NOTE — Telephone Encounter (Signed)
Rx(s) sent to pharmacy electronically.  

## 2016-10-09 ENCOUNTER — Telehealth: Payer: Self-pay | Admitting: Cardiovascular Disease

## 2016-10-09 NOTE — Telephone Encounter (Signed)
New Message   pt verbalized that she is calling for rn   She wants rn to go over her medications and to   See what she should or should not take

## 2016-10-09 NOTE — Telephone Encounter (Signed)
Returned call to patient she wanted to make sure she needs to keep taking Depakote 250 mg daily and Pravastatin 40 mg daily.Advised to continue all medications.Advised to keep appointments as planned.

## 2016-10-10 NOTE — Progress Notes (Signed)
Subjective:   Patient ID: Abigail Wallace    DOB: October 21, 1945, 71 y.o. female   MRN: [id number]  CC: "High blood pressure"  HPI: Abigail Wallace is a 71 y.o. female who presents to clinic today for high blood pressure. Problems discussed today are as follows:  Hypertension: Patient recently purchased a wrist blood pressure cuff. Patient is accompanied by son who states they were unable to purchase brachial cough due to cuff sizing cost. Blood pressures have remained elevated with systolics 341P-379K and diastolic 24O-97D. ROS: Denies chest pain, shortness of breath, palpitations, feelings of syncope.  Breast screening: Patient is unsure when her last mammogram was. She denies any palpable masses or changes to her breast tissue.  DEXA screening: Patient has not previously been screened for osteoporosis. She states she has been on several medications that her psychiatrist said would cause her bones to be weak. She currently uses a wheelchair to get around and occasionally a walker at home.  Complete ROS performed, see HPI for pertinent.  [place]: HTN, HFpEF, CAD, complete heart block with pacemaker, hypothyroidism, COPD, OSA with CPAP, ventral hernia, gout. Surgical history pacemaker placement, cardiac cath (2007), cholecystectomy, tubal ligation. Family history heart disease, CKD, stroke, DM. Smoking status reviewed. Medications reviewed.  Objective:   BP (!) 142/80   Pulse 81   Temp 98.3 F (36.8 C) (Oral)   Ht 5\' 6"  (1.676 m)   Wt 292 lb (132.5 kg)   SpO2 96%   BMI 47.13 kg/m  Vitals and nursing note reviewed.  General: obese and frail elderly woman in wheelchair, in no acute distress with non-toxic appearance HEENT: normocephalic, atraumatic, moist mucous membranes Neck: supple, non-tender without lymphadenopathy CV: regular rate and rhythm without murmurs, rubs, or gallops, no lower extremity edema Lungs: clear to auscultation bilaterally with normal work of breathing Abdomen:  soft, non-tender, non-distended, normoactive bowel sounds Skin: warm, dry, no rashes or lesions, cap refill < 2 seconds Extremities: warm and well perfused, normal tone, chronic 1+pitting edema to left leg  Assessment & Plan:   Primary hypertension Chronic. Controlled though recent uric acid level suboptimal. Will need to switch off HCTZ. --Discontinuing HCTZ and initiating amlodipine 10 mg daily --RTC in 1 month for recheck of BP control  Long-term use of high-risk medication Needs DEXA scan given no history per chart review. Has been on multiple psych medications in past which increase risk for bone fragility. --Pt to schedule appt to receive DEXA scan along with screening mammogram  Orders Placed This Encounter  Procedures  . MM DIGITAL SCREENING BILATERAL    Standing Status:   Future    Standing Expiration Date:   12/11/2017    Order Specific Question:   Reason for Exam (SYMPTOM  OR DIAGNOSIS REQUIRED)    Answer:   Annual screen    Order Specific Question:   Preferred imaging location?    Answer:   [facility]  . DG Bone Density    Standing Status:   Future    Standing Expiration Date:   12/11/2017    Order Specific Question:   Reason for Exam (SYMPTOM  OR DIAGNOSIS REQUIRED)    Answer:   hx of osteoarthritis    Order Specific Question:   Preferred imaging location?    Answer:   [facility]  . POCT urinalysis dipstick   Meds ordered this encounter  Medications  . amLODipine (NORVASC) 10 MG tablet    Sig: Take 1 tablet (10 mg total) by mouth daily.  Dispense:  90 tablet    Refill:  [place], [place], PGY-2 10/12/2016 2:51 PM

## 2016-10-11 ENCOUNTER — Encounter: Payer: Self-pay | Admitting: Family Medicine

## 2016-10-11 ENCOUNTER — Ambulatory Visit (INDEPENDENT_AMBULATORY_CARE_PROVIDER_SITE_OTHER): Payer: Medicare Other | Admitting: Family Medicine

## 2016-10-11 VITALS — BP 142/80 | HR 81 | Temp 98.3°F | Ht 66.0 in | Wt 292.0 lb

## 2016-10-11 DIAGNOSIS — R300 Dysuria: Secondary | ICD-10-CM

## 2016-10-11 DIAGNOSIS — Z79899 Other long term (current) drug therapy: Secondary | ICD-10-CM | POA: Diagnosis not present

## 2016-10-11 DIAGNOSIS — I10 Essential (primary) hypertension: Secondary | ICD-10-CM

## 2016-10-11 DIAGNOSIS — E2839 Other primary ovarian failure: Secondary | ICD-10-CM

## 2016-10-11 DIAGNOSIS — M1711 Unilateral primary osteoarthritis, right knee: Secondary | ICD-10-CM

## 2016-10-11 DIAGNOSIS — Z1231 Encounter for screening mammogram for malignant neoplasm of breast: Secondary | ICD-10-CM

## 2016-10-11 DIAGNOSIS — Z1239 Encounter for other screening for malignant neoplasm of breast: Secondary | ICD-10-CM

## 2016-10-11 LAB — POCT URINALYSIS DIP (MANUAL ENTRY)
Bilirubin, UA: NEGATIVE
Glucose, UA: 100 mg/dL — AB
Ketones, POC UA: NEGATIVE mg/dL
Nitrite, UA: POSITIVE — AB
Protein Ur, POC: >=300 mg/dL — AB
Spec Grav, UA: 1.025
Urobilinogen, UA: 0.2 U/dL
pH, UA: 5.5

## 2016-10-11 MED ORDER — AMLODIPINE BESYLATE 10 MG PO TABS: 10.0000 mg | ORAL_TABLET | Freq: Every day | ORAL | 3 refills | Status: DC

## 2016-10-11 NOTE — Assessment & Plan Note (Deleted)
A 

## 2016-10-11 NOTE — Patient Instructions (Signed)
Thank you for coming in to see [place] today. Please see below to review our plan for today's visit.  1. Discontinue your HCTZ and start amlodipine daily. I would like to see you in one month to recheck her blood pressures. Please bring her blood pressure cuff at next visit. 2. Call the number on the piece of paper I gave you to schedule your screening mammogram and DEXA scan.  Please call the clinic at [phone] if your symptoms worsen or you have any concerns. It was my pleasure to see you. -- Harriet Butte, [place], PGY-2

## 2016-10-11 NOTE — Assessment & Plan Note (Addendum)
Chronic. Controlled though recent uric acid level suboptimal. Will need to switch off HCTZ. --Discontinuing HCTZ and initiating amlodipine 10 mg daily --RTC in 1 month for recheck of BP control

## 2016-10-11 NOTE — Assessment & Plan Note (Addendum)
Needs DEXA scan given no history per chart review. Has been on multiple psych medications in past which increase risk for bone fragility. --Pt to schedule appt to receive DEXA scan along with screening mammogram

## 2016-11-02 ENCOUNTER — Other Ambulatory Visit: Payer: Self-pay | Admitting: Family Medicine

## 2016-11-05 ENCOUNTER — Other Ambulatory Visit: Payer: Self-pay | Admitting: Family Medicine

## 2016-11-05 ENCOUNTER — Telehealth: Payer: Self-pay | Admitting: Family Medicine

## 2016-11-05 NOTE — Telephone Encounter (Signed)
Pt needs a refill on her Diclofenac Sodium 1% gel called in. Her pharmacy has been sending this is too the wrong pcp. jw

## 2016-11-06 ENCOUNTER — Other Ambulatory Visit: Payer: Self-pay | Admitting: Family Medicine

## 2016-11-06 MED ORDER — DICLOFENAC SODIUM 1 % TD GEL: 4.0000 g | Freq: Four times a day (QID) | TRANSDERMAL | 2 refills | Status: DC

## 2016-11-06 NOTE — Telephone Encounter (Signed)
Rx sent 

## 2016-11-06 NOTE — Telephone Encounter (Signed)
Son is calling back to check the status of his mother's Diclofenac. jw

## 2016-11-13 ENCOUNTER — Other Ambulatory Visit: Payer: Self-pay | Admitting: Cardiovascular Disease

## 2016-11-13 ENCOUNTER — Telehealth: Payer: Self-pay | Admitting: Cardiovascular Disease

## 2016-11-13 MED ORDER — NITROGLYCERIN 0.4 MG SL SUBL: 0.4000 mg | SUBLINGUAL_TABLET | SUBLINGUAL | 1 refills | Status: DC | PRN

## 2016-11-13 NOTE — Telephone Encounter (Signed)
Rx(s) sent to pharmacy electronically.  

## 2016-11-13 NOTE — Telephone Encounter (Signed)
Not sure what these shocks represent. Suspect not a cardiac issue. May be related to cervical spine problems? Suggest she contact her PCP or neurologist. Do not think they are pacemaker related. MCr

## 2016-11-13 NOTE — Telephone Encounter (Signed)
Patient called with MD advice/recommendations. She voiced understanding.

## 2016-11-13 NOTE — Telephone Encounter (Signed)
New Message   Per pt is having shock pains going through her legs and arms. Has been experiencing 2-3 weeks. Requesting call back from nurse.

## 2016-11-13 NOTE — Telephone Encounter (Signed)
Returned call to patient of Dr. Loletha Grayer. She reports "shocks going thru me" in both arms and both legs for 2-3 weeks. She states it feels like something electrical going thru her. Her symptoms are not correlated to any sort of activity. She has trouble breathing, but has this a lot. She reports chest pain last week - did not take NTG for this. She is most concerned about these "shocks"  Advised would route to Dr. Sallyanne Kuster for review - she also says "hi"

## 2016-11-13 NOTE — Telephone Encounter (Signed)
New Message    *STAT* If patient is at the pharmacy, call can be transferred to refill team.   1. Which medications need to be refilled? (please list name of each medication and dose if known) nitroGLYCERIN (NITROSTAT) 0.4 MG SL tablet  2. Which pharmacy/location (including street and city if local pharmacy) is medication to be sent to? [place], [place]  3. Do they need a 30 day or 90 day supply? 30 day supply

## 2016-11-20 ENCOUNTER — Telehealth: Payer: Self-pay | Admitting: Family Medicine

## 2016-11-20 NOTE — Telephone Encounter (Signed)
Pt son called last week. He has found out his mothers medical information has been shared with [place].  He has a letter from [facility] stating they received specific medical information from [facility]. He understands Medicare does sell information to companies. His concern is HIPPA violation and where is the line drawn.  Please give him a call

## 2016-11-22 ENCOUNTER — Telehealth: Payer: Self-pay | Admitting: Cardiology

## 2016-11-22 ENCOUNTER — Other Ambulatory Visit: Payer: Self-pay | Admitting: Family Medicine

## 2016-11-22 ENCOUNTER — Ambulatory Visit (INDEPENDENT_AMBULATORY_CARE_PROVIDER_SITE_OTHER): Payer: Medicare Other | Admitting: *Deleted

## 2016-11-22 DIAGNOSIS — R300 Dysuria: Secondary | ICD-10-CM

## 2016-11-22 DIAGNOSIS — I495 Sick sinus syndrome: Secondary | ICD-10-CM | POA: Diagnosis not present

## 2016-11-22 MED ORDER — CEPHALEXIN 500 MG PO CAPS: 500.0000 mg | ORAL_CAPSULE | Freq: Four times a day (QID) | ORAL | 0 refills | Status: AC

## 2016-11-22 NOTE — Telephone Encounter (Signed)
Confirmed remote transmission w/ pt son.    

## 2016-11-22 NOTE — Progress Notes (Signed)
Contacted patient's son regarding concern for HIPPA violation.  Son is frustrated with frequent phone calls by home health care providers, namely [facility].  He would like to know if there was a document that can be signed to prevent his mother's health information from being disclosed to outside care facilities.  Instructed son to contact [facility] and discussed with our Environmental education officer.  He also states there is endorsing polyuria with foul-smelling urine.  He believes this is uncomplicated cystitis, consistent with his mother's past history.  He is requesting Keflex and understands red flags concerning pyelonephritis.  Keflex 500 mg 4 times daily for 5 days prescribed with instructions to follow-up if symptoms do not improve.  Harriet Butte, [place], PGY-2

## 2016-11-22 NOTE — Telephone Encounter (Signed)
Contacted patient's son regarding concern for HIPPA violation.  Son is frustrated with frequent phone calls by home health care providers, namely [facility].  He would like to know if there was a document that can be signed to prevent his mother's health information from being disclosed to outside care facilities.  Instructed son to contact [facility] and discussed with our Environmental education officer.  He also states there is endorsing polyuria with foul-smelling urine.  He believes this is uncomplicated cystitis, consistent with his mother's past history.  He is requesting Keflex and understands red flags concerning pyelonephritis.  Keflex 500 mg 4 times daily for 5 days prescribed with instructions to follow-up if symptoms do not improve.

## 2016-11-23 ENCOUNTER — Encounter: Payer: Self-pay | Admitting: Cardiology

## 2016-11-23 NOTE — Progress Notes (Signed)
Remote pacemaker transmission.   

## 2016-11-27 ENCOUNTER — Telehealth: Payer: Self-pay | Admitting: Cardiology

## 2016-11-27 NOTE — Telephone Encounter (Signed)
Patient called and stated that she wants the results from her 11-22-16 transmission. Call forwarded to [place].

## 2016-11-27 NOTE — Telephone Encounter (Signed)
Remote reviewed. Presenting rhythm: ApVp. (2) AHR episodes, max dur. 65mins 38sec-PACs. Stable device function. Estimated longevity 10years. Normal remote.  Informed patient of remote results. Patient verbalized understanding.

## 2016-11-28 DIAGNOSIS — F200 Paranoid schizophrenia: Secondary | ICD-10-CM | POA: Diagnosis not present

## 2016-11-28 LAB — CUP PACEART REMOTE DEVICE CHECK
Battery Impedance: 134 Ohm
Battery Remaining Longevity: 121 mo
Battery Voltage: 2.79 V
Brady Statistic AP VP Percent: 58 %
Brady Statistic AS VP Percent: 42 %
Brady Statistic AS VS Percent: 0 %
Date Time Interrogation Session: 20181108185938
Implantable Lead Implant Date: 20070414
Implantable Lead Location: 753860
Implantable Lead Model: 4092
Implantable Pulse Generator Implant Date: 20160816
Lead Channel Impedance Value: 486 Ohm
Lead Channel Pacing Threshold Amplitude: 0.625 V
Lead Channel Setting Pacing Amplitude: 2 V
Lead Channel Setting Pacing Amplitude: 2.5 V
MDC IDC LEAD IMPLANT DT: 20070914
MDC IDC LEAD LOCATION: 753859
MDC IDC MSMT LEADCHNL RA PACING THRESHOLD PULSEWIDTH: 0.4 ms
MDC IDC MSMT LEADCHNL RV IMPEDANCE VALUE: 603 Ohm
MDC IDC MSMT LEADCHNL RV PACING THRESHOLD AMPLITUDE: 0.75 V
MDC IDC MSMT LEADCHNL RV PACING THRESHOLD PULSEWIDTH: 0.4 ms
MDC IDC SET LEADCHNL RV PACING PULSEWIDTH: 0.4 ms
MDC IDC SET LEADCHNL RV SENSING SENSITIVITY: 4 mV
MDC IDC STAT BRADY AP VS PERCENT: 0 %

## 2017-01-15 ENCOUNTER — Emergency Department (HOSPITAL_COMMUNITY): Payer: Medicare Other

## 2017-01-15 ENCOUNTER — Other Ambulatory Visit: Payer: Self-pay

## 2017-01-15 ENCOUNTER — Encounter (HOSPITAL_COMMUNITY): Payer: Self-pay

## 2017-01-15 ENCOUNTER — Emergency Department (HOSPITAL_COMMUNITY)
Admission: EM | Admit: 2017-01-15 | Discharge: 2017-01-15 | Disposition: A | Discharge status: Home / Self Care | Payer: Medicare Other | Attending: Emergency Medicine | Admitting: Emergency Medicine

## 2017-01-15 DIAGNOSIS — J449 Chronic obstructive pulmonary disease, unspecified: Secondary | ICD-10-CM | POA: Diagnosis not present

## 2017-01-15 DIAGNOSIS — Z79899 Other long term (current) drug therapy: Secondary | ICD-10-CM | POA: Diagnosis not present

## 2017-01-15 DIAGNOSIS — J189 Pneumonia, unspecified organism: Secondary | ICD-10-CM

## 2017-01-15 DIAGNOSIS — Z950 Presence of cardiac pacemaker: Secondary | ICD-10-CM | POA: Diagnosis not present

## 2017-01-15 DIAGNOSIS — Z91040 Latex allergy status: Secondary | ICD-10-CM | POA: Insufficient documentation

## 2017-01-15 DIAGNOSIS — F17210 Nicotine dependence, cigarettes, uncomplicated: Secondary | ICD-10-CM | POA: Insufficient documentation

## 2017-01-15 DIAGNOSIS — R0789 Other chest pain: Secondary | ICD-10-CM | POA: Diagnosis not present

## 2017-01-15 DIAGNOSIS — I5032 Chronic diastolic (congestive) heart failure: Secondary | ICD-10-CM | POA: Diagnosis not present

## 2017-01-15 DIAGNOSIS — N183 Chronic kidney disease, stage 3 (moderate): Secondary | ICD-10-CM | POA: Insufficient documentation

## 2017-01-15 DIAGNOSIS — I130 Hypertensive heart and chronic kidney disease with heart failure and stage 1 through stage 4 chronic kidney disease, or unspecified chronic kidney disease: Secondary | ICD-10-CM | POA: Diagnosis not present

## 2017-01-15 DIAGNOSIS — I2510 Atherosclerotic heart disease of native coronary artery without angina pectoris: Secondary | ICD-10-CM | POA: Diagnosis not present

## 2017-01-15 DIAGNOSIS — K439 Ventral hernia without obstruction or gangrene: Secondary | ICD-10-CM

## 2017-01-15 DIAGNOSIS — R1030 Lower abdominal pain, unspecified: Secondary | ICD-10-CM

## 2017-01-15 DIAGNOSIS — Z7902 Long term (current) use of antithrombotics/antiplatelets: Secondary | ICD-10-CM | POA: Diagnosis not present

## 2017-01-15 DIAGNOSIS — E039 Hypothyroidism, unspecified: Secondary | ICD-10-CM | POA: Diagnosis not present

## 2017-01-15 DIAGNOSIS — R918 Other nonspecific abnormal finding of lung field: Secondary | ICD-10-CM | POA: Diagnosis not present

## 2017-01-15 DIAGNOSIS — R1031 Right lower quadrant pain: Secondary | ICD-10-CM | POA: Diagnosis not present

## 2017-01-15 HISTORY — DX: Ventral hernia without obstruction or gangrene: K43.9

## 2017-01-15 LAB — CBC
HCT: 37.8 % (ref 36.0–46.0)
Hemoglobin: 12.2 g/dL (ref 12.0–15.0)
MCH: 28.7 pg (ref 26.0–34.0)
MCHC: 32.3 g/dL (ref 30.0–36.0)
MCV: 88.9 fL (ref 78.0–100.0)
Platelets: 296 10*3/uL (ref 150–400)
RBC: 4.25 MIL/uL (ref 3.87–5.11)
RDW: 14.5 % (ref 11.5–15.5)
WBC: 12.9 10*3/uL — ABNORMAL HIGH (ref 4.0–10.5)

## 2017-01-15 LAB — I-STAT TROPONIN, ED: Troponin i, poc: 0.01 ng/mL (ref 0.00–0.08)

## 2017-01-15 LAB — BASIC METABOLIC PANEL
Anion gap: 12 (ref 5–15)
BUN: 30 mg/dL — AB (ref 6–20)
CHLORIDE: 107 mmol/L (ref 101–111)
CO2: 17 mmol/L — ABNORMAL LOW (ref 22–32)
CREATININE: 1.49 mg/dL — AB (ref 0.44–1.00)
Calcium: 10.2 mg/dL (ref 8.9–10.3)
GFR calc Af Amer: 39 mL/min — ABNORMAL LOW (ref >60)
GFR calc non Af Amer: 34 mL/min — ABNORMAL LOW (ref >60)
Glucose, Bld: 183 mg/dL — ABNORMAL HIGH (ref 65–99)
Potassium: 4.4 mmol/L (ref 3.5–5.1)
SODIUM: 136 mmol/L (ref 135–145)

## 2017-01-15 LAB — LIPASE, BLOOD: Lipase: 66 U/L — ABNORMAL HIGH (ref 11–51)

## 2017-01-15 MED ORDER — IOPAMIDOL (ISOVUE-300) INJECTION 61%
INTRAVENOUS | Status: AC
  Administered 2017-01-15: 75 mL
  Filled 2017-01-15: qty 100

## 2017-01-15 MED ORDER — AMOXICILLIN-POT CLAVULANATE 875-125 MG PO TABS
1.0000 | ORAL_TABLET | Freq: Once | ORAL | Status: AC
  Administered 2017-01-15: 1 via ORAL
  Filled 2017-01-15: qty 1

## 2017-01-15 MED ORDER — AMOXICILLIN-POT CLAVULANATE 875-125 MG PO TABS: 1.0000 | ORAL_TABLET | Freq: Two times a day (BID) | ORAL | 0 refills | Status: DC

## 2017-01-15 NOTE — ED Notes (Signed)
Patient given bag meal with drink. 

## 2017-01-15 NOTE — ED Provider Notes (Signed)
[facility] EMERGENCY DEPARTMENT Provider Note   CSN: [id number] Arrival date & time: 01/15/17  1151     History   Chief Complaint Chief Complaint  Patient presents with  . Chest Pain  . Abdominal Pain    HPI Abigail Wallace is a 72 y.o. female.  HPI  The pt is a 72 y/o female - with hx of COPD, she has had hx of Tardive Dyskinesia and a hx of schizophrenia and has come to the hospital today with her son with a c/o chest pain associated with some chills and sweating for the last 3 weeks.  She denies any sputum production, she has chronic swelling of her legs but has refused to sit still for the DVT studies that been ordered in the past and so we do not have any objective measurements of her legs.  She has also had some type of abdominal tumor which is thought to be getting bigger according to the son who is the primary historian.  The patient has also had diarrhea going on and off for the last 3 weeks.  She lives by herself, she gets around in a wheelchair, the son checks on her every day.  There is very little other information obtained from the patient due to her inability to give a clear history.  Son states she is at baseline mental status.   The patient has had polio as well as some chronic hip pain which limit her ability to ambulate.  Review of the medical record shows that a CT scan obtained in 2013 showed a fat-containing umbilical hernia, no other acute findings were found at that time  Past Medical History:  Diagnosis Date  . COPD (chronic obstructive pulmonary disease) ([place])   . Coronary artery disease    NON-CRITICAL  . Cystitis    Frequent episodes.   Marland Kitchen GERD (gastroesophageal reflux disease)   . Gout   . Hypertension   . Memory difficulties 12/02/2013  . Mental disorder   . Psychosis ([place])   . Tardive dyskinesia 12/02/2013    Patient Active Problem List   Diagnosis Date Noted  . Long-term use of high-risk medication 10/11/2016  . CKD stage  IIIb 09/12/2016  . Subclinical hypothyroidism 04/27/2015  . Umbilical hernia 74/25/9563  . DUB (dysfunctional uterine bleeding) 02/22/2015  . Tobacco use disorder 05/10/2014  . Memory difficulties 12/02/2013  . Tardive dyskinesia 12/02/2013  . CHB (complete heart block) ([place]) 06/22/2013  . Primary hypertension 06/22/2013  . Pressure sore on buttocks 04/10/2012  . Gout 09/26/2011  . Pacemaker 03/30/2010  . Overactive bladder 03/27/2010  . Vaginal prolapse 03/27/2010  . Sleep apnea 03/27/2010  . Osteoarthritis of right knee 03/27/2010  . Lymphedema of left leg 02/17/2010  . Resting tremor 12/15/2009  . Chronic diastolic heart failure ([place]) 09/30/2009  . Atherosclerosis of coronary artery of native heart without angina pectoris 10/18/2006  . Sick sinus syndrome ([place]) 04/24/2006  . Mixed hyperlipidemia 03/14/2006  . Paranoid schizophrenia ([place]) 03/14/2006  . COPD 03/14/2006    Past Surgical History:  Procedure Laterality Date  . CARDIAC CATHETERIZATION  2007   Non-critical.   . CHOLECYSTECTOMY    . EP IMPLANTABLE DEVICE N/A 08/31/2014   Procedure: PPM Generator Changeout;  Surgeon: Sanda Klein, MD;  Location: [facility];  Service: Cardiovascular;  Laterality: N/A;  . INSERT / REPLACE / REMOVE PACEMAKER  2007   [place]/SYMPTOMATIC HEART BLOCK  . PACEMAKER PLACEMENT  09/28/2005   2/2 SSS?  Marland Kitchen  Persantine stress test  05/01/2010   EF 66%. Normal LV sys fx. Unchanged from previous studies.   . TRANSTHORACIC ECHOCARDIOGRAM  09/08/10   SEVERE CONCENTRIC HYPERTROPHY.LV FUNCTION WAS VIGOROUS.EF 65%-70%.VENTRICULAR SEPTUM-INCOORDINATE MOTION.LEFT ATRIUM-MILDLY DILATED.TRIVIAL TR.  . TUBAL LIGATION      OB History    No data available       Home Medications    Prior to Admission medications   Medication Sig Start Date End Date Taking? Authorizing Provider  acetaminophen (TYLENOL) 500 MG tablet Take 1,000 mg by mouth at bedtime.    Yes [provider]    allopurinol (ZYLOPRIM) 100 MG tablet TAKE 1 TABLET (100 MG TOTAL) BY MOUTH DAILY. 09/12/16  Yes Croitoru, Mihai, MD  amLODipine (NORVASC) 10 MG tablet Take 1 tablet (10 mg total) by mouth daily. 10/11/16  Yes La Vernia Bing, DO  diclofenac sodium (VOLTAREN) 1 % GEL Apply 4 g topically 4 (four) times daily. Patient taking differently: Apply 4 g topically 4 (four) times daily as needed (joint pain).  11/06/16  Yes Farrell Bing, DO  divalproex (DEPAKOTE ER) 250 MG 24 hr tablet Take 250 mg by mouth at bedtime.  03/31/16  Yes [provider]  losartan (COZAAR) 100 MG tablet take 1 tablet by mouth once daily Patient taking differently: take 1 tablet (100mg ) by mouth once daily 03/02/16  Yes Croitoru, Mihai, MD  metoprolol tartrate (LOPRESSOR) 50 MG tablet TAKE 1 TABLET BY MOUTH TWICE A DAY Patient taking differently: TAKE 1 TABLET (50mg ) BY MOUTH TWICE A DAY 09/10/16  Yes Croitoru, Mihai, MD  nitroGLYCERIN (NITROSTAT) 0.4 MG SL tablet Place 1 tablet (0.4 mg total) under the tongue every 5 (five) minutes x 3 doses as needed. For chest 11/13/16  Yes Croitoru, Mihai, MD  nystatin cream (MYCOSTATIN) Apply 1 application topically 2 (two) times daily. 04/20/16  Yes McKeag, Marylynn Pearson, MD  OLANZapine (ZYPREXA) 10 MG tablet Take 10 mg by mouth at bedtime. 06/08/14  Yes [provider]  pravastatin (PRAVACHOL) 40 MG tablet TAKE 1 TABLET BY MOUTH DAILY IN THE EVENING Patient taking differently: TAKE 1 TABLET (40mg ) BY MOUTH DAILY IN THE MORNING 09/18/16  Yes Croitoru, Mihai, MD  QUEtiapine (SEROQUEL) 25 MG tablet Take 25 mg by mouth at bedtime.  06/08/14  Yes [provider]  amoxicillin-clavulanate (AUGMENTIN) 875-125 MG tablet Take 1 tablet by mouth every 12 (twelve) hours. 01/15/17   Noemi Chapel, MD  Incontinence Supplies MISC 1 Units by Does not apply route as needed. 12/13/15   McKeag, Marylynn Pearson, MD    Family History Family History  Problem Relation Age of Onset  . Heart disease Mother    . Kidney disease Mother   . Heart disease Father   . Kidney disease Father   . Stroke Sister   . Diabetes type II Son     Social History Social History   Tobacco Use  . Smoking status: Current Some Day Smoker    Packs/day: 0.25    Types: Cigarettes  . Smokeless tobacco: Never Used  . Tobacco comment: 6 cigs a day  Substance Use Topics  . Alcohol use: No    Alcohol/week: 0.0 oz  . Drug use: No     Allergies   Abilify [aripiprazole]; Clozapine; Benadryl [diphenhydramine hcl]; Cogentin [benztropine]; Haloperidol lactate; Latuda [lurasidone hcl]; Remeron [mirtazapine]; Codeine; and Latex   Review of Systems Review of Systems  All other systems reviewed and are negative.    Physical Exam Updated Vital Signs BP Marland Kitchen)  152/73   Pulse 72   Temp 98.6 F (37 C) (Oral)   Resp 18   SpO2 99%   Physical Exam  Constitutional: She appears well-developed and well-nourished. No distress.  HENT:  Head: Normocephalic and atraumatic.  Mouth/Throat: Oropharynx is clear and moist. No oropharyngeal exudate.  Eyes: Conjunctivae and EOM are normal. Pupils are equal, round, and reactive to light. Right eye exhibits no discharge. Left eye exhibits no discharge. No scleral icterus.  Neck: Normal range of motion. Neck supple. No JVD present. No thyromegaly present.  Cardiovascular: Normal rate, regular rhythm, normal heart sounds and intact distal pulses. Exam reveals no gallop and no friction rub.  No murmur heard. Pacemaker present in the left upper chest wall, nontender  Pulmonary/Chest: Effort normal and breath sounds normal. No respiratory distress. She has no wheezes. She has no rales.  Lungs are clear, unlabored, normal work of breathing  Abdominal: Soft. Bowel sounds are normal. She exhibits no distension and no mass. There is no tenderness.  Morbidly obese, soft nontender abdomen, fullness in the lower pannus but no erythema induration or significant tenderness, no definitive mass  palpated  Musculoskeletal: Normal range of motion. She exhibits edema ( Left lower extremity is slightly larger than the right lower extremity with mild edema.). She exhibits no tenderness.  Lymphadenopathy:    She has no cervical adenopathy.  Neurological: She is alert. Coordination normal.  The patient is able to follow commands with minimal difficulty, she does have difficulty ambulating or getting onto the bed.  Skin: Skin is warm and dry. No rash noted. No erythema.  No signs of skin breakdown erythema or induration  Psychiatric: She has a normal mood and affect. Her behavior is normal.  Nursing note and vitals reviewed.    ED Treatments / Results  Labs (all labs ordered are listed, but only abnormal results are displayed) Labs Reviewed  BASIC METABOLIC PANEL - Abnormal; Notable for the following components:      Result Value   CO2 17 (*)    Glucose, Bld 183 (*)    BUN 30 (*)    Creatinine, Ser 1.49 (*)    GFR calc non Af Amer 34 (*)    GFR calc Af Amer 39 (*)    All other components within normal limits  CBC - Abnormal; Notable for the following components:   WBC 12.9 (*)    All other components within normal limits  LIPASE, BLOOD - Abnormal; Notable for the following components:   Lipase 66 (*)    All other components within normal limits  URINALYSIS, ROUTINE W REFLEX MICROSCOPIC  I-STAT TROPONIN, ED    EKG  EKG Interpretation  Date/Time:  Tuesday January 15 2017 11:56:37 EST Ventricular Rate:  72 PR Interval:  192 QRS Duration: 136 QT Interval:  402 QTC Calculation: 440 R Axis:   -74 Text Interpretation:  AV sequential or dual chamber electronic pacemaker since last tracing no significant change Confirmed by Noemi Chapel [phone]) on 01/15/2017 6:36:46 PM       Radiology Dg Chest 2 View  Result Date: 01/15/2017 CLINICAL DATA:  Central chest and right lower quadrant pain. EXAM: CHEST  2 VIEW COMPARISON:  11/23/2010 FINDINGS: AP a lateral views of the chest  show hyperexpansion. The cardio pericardial silhouette is enlarged. Interstitial markings are diffusely coarsened with chronic features. Airspace opacity overlying the lower spine on the lateral film suggests pneumonia. Permanent pacemaker again noted. The visualized bony structures of the thorax are intact. IMPRESSION:  Cardiomegaly with hyperexpansion. Airspace opacity overlying the spine on the lateral film suggests posterior lower lobe pneumonia. Electronically Signed   By: Misty Stanley M.D.   On: 01/15/2017 12:48   Ct Abdomen Pelvis W Contrast  Result Date: 01/15/2017 CLINICAL DATA:  Right lower quadrant pain with nausea and chills for 1 week. EXAM: CT ABDOMEN AND PELVIS WITH CONTRAST TECHNIQUE: Multidetector CT imaging of the abdomen and pelvis was performed using the standard protocol following bolus administration of intravenous contrast. CONTRAST:  <See Chart> ISOVUE-300 IOPAMIDOL (ISOVUE-300) INJECTION 61% COMPARISON:  01/02/2012 FINDINGS: Lower chest: No pleural effusion. Hepatobiliary: Hepatic steatosis. No focal liver abnormality. Previous cholecystectomy. No biliary dilatation. Pancreas: Unremarkable. No pancreatic ductal dilatation or surrounding inflammatory changes. Spleen: Normal in size without focal abnormality. Adrenals/Urinary Tract: The adrenal glands are normal. No hydronephrosis identified bilaterally. Exophytic lesion arising from the lateral cortex of the right kidney measures 1.9 cm and 32 HU. Previously 2.1 cm and 2.8 HU. Simple appearing cyst arising from the medial cortex of the right inferior pole measures 1.5 cm. Urinary bladder appears normal. Stomach/Bowel: Stomach is normal. The small bowel loops have a normal caliber without evidence for bowel obstruction. The appendix is not confidently identified separate from the right lower quadrant bowel loops. No secondary signs of acute appendicitis however there is a large ventral abdominal wall hernia which predominantly consists of  omental fat. There is a nonobstructed loop of transverse colon within this hernia. No pathologic dilatation of the colon. A few scattered distal colonic diverticular noted without diverticulitis. Vascular/Lymphatic: Aortic atherosclerosis. There is an infrarenal abdominal aorta which measures 2.9 cm in maximum AP diameter, image 39 of series 3. No upper abdominal adenopathy. Right external iliac node is prominent measuring 11 mm short axis. Previously 8 mm. Reproductive: Uterus and bilateral adnexa are unremarkable. Other: No abdominal wall hernia or abnormality. No abdominopelvic ascites. Musculoskeletal: Degenerative disc disease identified within the lumbar spine. There is advanced right hip osteoarthritis. IMPRESSION: 1. No acute findings identified within the abdomen or pelvis. 2. Large ventral abdominal wall hernia which contains omental fat and a loop of nonobstructed transverse colon. 3. Exophytic lesion arising from lateral cortex of right kidney measures 32 HU. This may represent a hyperdense/hemorrhagic cyst or solid kidney lesion. Followup imaging with nonemergent renal protocol MRI or CT is suggested for further investigation. 4.  Aortic Atherosclerosis (ICD10-I70.0). 5. Ectatic abdominal aorta at risk for aneurysm development. Recommend followup by ultrasound in 5 years. This recommendation follows ACR consensus guidelines: White Paper of the ACR Incidental Findings Committee II on Vascular Findings. J Am Coll Radiol 2013; 10:789-794. 6. Hepatic steatosis. Electronically Signed   By: Kerby Moors M.D.   On: 01/15/2017 20:21    Procedures Procedures (including critical care time)  Medications Ordered in ED Medications  amoxicillin-clavulanate (AUGMENTIN) 875-125 MG per tablet 1 tablet (not administered)  iopamidol (ISOVUE-300) 61 % injection (75 mLs  Contrast Given 01/15/17 1953)     Initial Impression / Assessment and Plan / ED Course  I have reviewed the triage vital signs and the  nursing notes.  Pertinent labs & imaging results that were available during my care of the patient were reviewed by me and considered in my medical decision making (see chart for details).     Symptoms are rather nonspecific regarding her chest, she does not have active chest pain at this time, she has a nonobstructive history.  Abdominal pain is also rather vague, it is driven mostly by the son who feels  that there is a mass in the lower abdomen.  CT scan will be obtained as the patient is in the department.  X-ray, labs, recheck, EKG is unremarkable  I discussed the care with the patient and her son, she has some progressive renal insufficiency which is minimal, she has a new possible pneumonia found on x-ray and a blood count of 12,900.  She has no hypoxia, she is speaking in full sentences, she can be discharged with antibiotics to follow-up in the outpatient setting.  I have gone over all of the CT scan findings with the patient and her son and they will follow-up in the office for this renal lesion.  The hernia contains large bowel, no obstruction seen  Final Clinical Impressions(s) / ED Diagnoses   Final diagnoses:  Lower abdominal pain  Ventral hernia without obstruction or gangrene  Community acquired pneumonia, unspecified laterality      Noemi Chapel, MD 01/15/17 2133

## 2017-01-15 NOTE — Discharge Instructions (Signed)
Please follow-up with your family doctor within the next 2 weeks for a recheck of your kidney function which has slightly declined with a creatinine of 1.49  You should also follow-up with your doctor regarding your kidney which has a spot on it and may need further testing with an MRI.  You do have a hernia however at this time you would not find much success with surgery as there would likely be complications.  If you do develop increased pain vomiting or fevers please return to the emergency department immediately.

## 2017-01-15 NOTE — ED Triage Notes (Signed)
Pt presents for evaluation of central chest pain and RLQ/central abd pain x1 week. Reports chills as well. Denies N/V/D.

## 2017-01-15 NOTE — ED Notes (Signed)
Patient Alert and oriented X4. Stable and ambulatory. Patient verbalized understanding of the discharge instructions.  Patient belongings were taken by the patient.  

## 2017-01-16 ENCOUNTER — Telehealth: Payer: Self-pay | Admitting: Family Medicine

## 2017-01-16 ENCOUNTER — Other Ambulatory Visit: Payer: Self-pay | Admitting: Cardiovascular Disease

## 2017-01-16 ENCOUNTER — Encounter: Payer: Self-pay | Admitting: Family Medicine

## 2017-01-16 ENCOUNTER — Other Ambulatory Visit: Payer: Self-pay | Admitting: Family Medicine

## 2017-01-16 DIAGNOSIS — N281 Cyst of kidney, acquired: Secondary | ICD-10-CM | POA: Insufficient documentation

## 2017-01-16 DIAGNOSIS — I77811 Abdominal aortic ectasia: Secondary | ICD-10-CM | POA: Insufficient documentation

## 2017-01-16 DIAGNOSIS — N289 Disorder of kidney and ureter, unspecified: Secondary | ICD-10-CM

## 2017-01-16 NOTE — Telephone Encounter (Signed)
Nephrology referral placed. Please notify patient. Thank you.

## 2017-01-16 NOTE — Telephone Encounter (Signed)
Pt was seen in ED yesterday for abdominal and chest pains.  CT Scan showed lesions on her kidneys. The dr suggested this might be cancer and suggested a renal specialist.  Dr Yisroel Ramming doesn't have any appt until Jan 25.  Family doesn't want to wait until then to find out whats going on. Please advise

## 2017-01-21 NOTE — Telephone Encounter (Signed)
Pt informed that referral sent on 01/18/17. Fleeger, [name], CMA

## 2017-02-06 ENCOUNTER — Telehealth: Payer: Self-pay | Admitting: Family Medicine

## 2017-02-06 ENCOUNTER — Encounter: Payer: Self-pay | Admitting: Family Medicine

## 2017-02-06 NOTE — Telephone Encounter (Signed)
Printed off letter and faxed it to [facility] at  [phone] and mailed a copy to patient per instructions to address on file.Ozella Almond, CMA

## 2017-02-06 NOTE — Telephone Encounter (Signed)
Pt uses [employer] for transportation to drs appt. Because she is in a wheelchair, her son comes with her. [employer] needs a letter from the dr stating he is needed to come with her.  Please send letter to [employer].  Fax # [phone].  Also please mail letter to pt. Pt has an appt tomorrow.

## 2017-02-06 NOTE — Telephone Encounter (Signed)
Letter has been written for [employer] transportation.  Please fax this to [phone].  Thank you.

## 2017-02-07 DIAGNOSIS — N281 Cyst of kidney, acquired: Secondary | ICD-10-CM | POA: Diagnosis not present

## 2017-02-07 DIAGNOSIS — N183 Chronic kidney disease, stage 3 (moderate): Secondary | ICD-10-CM | POA: Diagnosis not present

## 2017-02-21 ENCOUNTER — Ambulatory Visit (INDEPENDENT_AMBULATORY_CARE_PROVIDER_SITE_OTHER): Payer: Medicare Other | Admitting: *Deleted

## 2017-02-21 ENCOUNTER — Telehealth: Payer: Self-pay | Admitting: Cardiology

## 2017-02-21 DIAGNOSIS — I495 Sick sinus syndrome: Secondary | ICD-10-CM

## 2017-02-21 NOTE — Telephone Encounter (Signed)
Pt wants transportation letter re faxed to [phone]. She doesn't use [employer] transportation.  The letter needs to say [facility].

## 2017-02-21 NOTE — Telephone Encounter (Signed)
Spoke with pt and reminded pt of remote transmission that is due today. Pt verbalized understanding.   

## 2017-02-21 NOTE — Progress Notes (Signed)
Remote pacemaker transmission.   

## 2017-02-22 ENCOUNTER — Encounter: Payer: Self-pay | Admitting: Family Medicine

## 2017-02-22 NOTE — Telephone Encounter (Signed)
Letter written ready to fax.  Please inform patient this was done.  Thank you.

## 2017-02-22 NOTE — Telephone Encounter (Signed)
Faced letter to [employer]. Deseree Kennon Holter, CMA

## 2017-02-26 DIAGNOSIS — F200 Paranoid schizophrenia: Secondary | ICD-10-CM | POA: Diagnosis not present

## 2017-02-27 ENCOUNTER — Encounter: Payer: Self-pay | Admitting: Cardiology

## 2017-03-05 LAB — CUP PACEART INCLINIC DEVICE CHECK
Brady Statistic AP VP Percent: 77 %
Brady Statistic AP VS Percent: 0 %
Brady Statistic AS VS Percent: 0 %
Date Time Interrogation Session: 20180510143434
Implantable Lead Implant Date: 20070414
Implantable Pulse Generator Implant Date: 20160816
Lead Channel Impedance Value: 650 Ohm
Lead Channel Pacing Threshold Amplitude: 0.625 V
Lead Channel Setting Sensing Sensitivity: 4 mV
MDC IDC LEAD IMPLANT DT: 20070914
MDC IDC LEAD LOCATION: 753859
MDC IDC LEAD LOCATION: 753860
MDC IDC MSMT BATTERY IMPEDANCE: 110 Ohm
MDC IDC MSMT BATTERY REMAINING LONGEVITY: 125 mo
MDC IDC MSMT BATTERY VOLTAGE: 2.79 V
MDC IDC MSMT LEADCHNL RA IMPEDANCE VALUE: 460 Ohm
MDC IDC MSMT LEADCHNL RA PACING THRESHOLD AMPLITUDE: 0.625 V
MDC IDC MSMT LEADCHNL RA PACING THRESHOLD PULSEWIDTH: 0.4 ms
MDC IDC MSMT LEADCHNL RV PACING THRESHOLD PULSEWIDTH: 0.4 ms
MDC IDC SET LEADCHNL RA PACING AMPLITUDE: 2 V
MDC IDC SET LEADCHNL RV PACING AMPLITUDE: 2.5 V
MDC IDC SET LEADCHNL RV PACING PULSEWIDTH: 0.4 ms
MDC IDC STAT BRADY AS VP PERCENT: 23 %

## 2017-03-08 ENCOUNTER — Telehealth: Payer: Self-pay | Admitting: Family Medicine

## 2017-03-08 NOTE — Telephone Encounter (Signed)
Called and spoke to patient's son Abigail Wallace and clarified what he wanted done.  He needed all information and results from last two CT abdomen's. Verified that son is authorized concerning patient's medical care.  Called Abigail Butts at [facility] and sent her the information needed on patient for surgery.Ozella Almond, CMA

## 2017-03-08 NOTE — Telephone Encounter (Signed)
Son [name] says he has left messages for the past 3 days.  Pt needs her last CT scan done on 01-15-17 at the hospital sent to [facility]. Medicaid and Medicare numbers need to be sent also.

## 2017-03-14 ENCOUNTER — Other Ambulatory Visit: Payer: Self-pay | Admitting: Cardiovascular Disease

## 2017-03-15 LAB — CUP PACEART REMOTE DEVICE CHECK
Brady Statistic AP VS Percent: 0 %
Brady Statistic AS VP Percent: 38 %
Brady Statistic AS VS Percent: 0 %
Implantable Lead Implant Date: 20070414
Implantable Lead Implant Date: 20070914
Implantable Lead Location: 753859
Implantable Lead Model: 5594
Lead Channel Pacing Threshold Amplitude: 0.625 V
Lead Channel Pacing Threshold Amplitude: 0.75 V
Lead Channel Pacing Threshold Pulse Width: 0.4 ms
Lead Channel Pacing Threshold Pulse Width: 0.4 ms
Lead Channel Setting Pacing Amplitude: 2.5 V
Lead Channel Setting Sensing Sensitivity: 4 mV
MDC IDC LEAD LOCATION: 753860
MDC IDC MSMT BATTERY IMPEDANCE: 158 Ohm
MDC IDC MSMT BATTERY REMAINING LONGEVITY: 116 mo
MDC IDC MSMT BATTERY VOLTAGE: 2.79 V
MDC IDC MSMT LEADCHNL RA IMPEDANCE VALUE: 480 Ohm
MDC IDC MSMT LEADCHNL RV IMPEDANCE VALUE: 661 Ohm
MDC IDC PG IMPLANT DT: 20160816
MDC IDC SESS DTM: 20190207185326
MDC IDC SET LEADCHNL RA PACING AMPLITUDE: 2 V
MDC IDC SET LEADCHNL RV PACING PULSEWIDTH: 0.4 ms
MDC IDC STAT BRADY AP VP PERCENT: 62 %

## 2017-03-25 ENCOUNTER — Telehealth: Payer: Self-pay

## 2017-03-25 NOTE — Telephone Encounter (Signed)
Pt and her son both called nurse line stating Pt was having some burning with urination and urine has an odor.Wanting to know if something could be called in for her without her having to come in for appointment. Pt call back [phone] Wallace Cullens, RN

## 2017-03-26 ENCOUNTER — Telehealth: Payer: Self-pay | Admitting: Family Medicine

## 2017-03-26 ENCOUNTER — Other Ambulatory Visit: Payer: Self-pay | Admitting: Family Medicine

## 2017-03-26 MED ORDER — CEPHALEXIN 500 MG PO CAPS: 500.0000 mg | ORAL_CAPSULE | Freq: Four times a day (QID) | ORAL | 0 refills | Status: AC

## 2017-03-26 NOTE — Telephone Encounter (Signed)
Pt and her son called again requesting meds be called in for her UTI. I offered to make an appointment, but the patients son said that with his moms health condition the doctor usually just calls something in since she has UTI's often. Please advise

## 2017-03-26 NOTE — Telephone Encounter (Signed)
Discussed with the patient's son who is her caretaker regarding concern for UTI.  Patient experiencing polyuria, irritability, cloudy and odorous urine consistent with prior UTIs in the past.  Patient's son is unable to bring her to the clinic due to inability to drive and to take them a few weeks to get an appointment.  Her last UTI cleared up with Keflex back in November.  If it seem to outweigh the risks.  Discussed reasons to seek medical care regarding pyelonephritis.    Harriet Butte, [place], PGY-2

## 2017-04-01 NOTE — Telephone Encounter (Signed)
Pt says the antibotic given to her isnt working.  She would like something else

## 2017-04-01 NOTE — Telephone Encounter (Signed)
Patient needs to be formally evaluated in the clinic and a urine sample needs to be obtained.  Please call and have patient schedule appointment.  Thanks.  Harriet Butte, [place], PGY-2

## 2017-04-02 NOTE — Telephone Encounter (Signed)
Called to make a follow up appointment concerning the UTI symptoms per Dr. Varney Daily. Patient's son Selena Lesser says that the patient is having mental issues right now and that he thinks that is part of the problem. He also says that the Keflex has made her feel a bit better.  I informed him that if the symptoms return that he can call to make an appointment.Ozella Almond, CMA

## 2017-04-04 ENCOUNTER — Other Ambulatory Visit: Payer: Self-pay | Admitting: Cardiovascular Disease

## 2017-04-05 NOTE — Telephone Encounter (Signed)
Rx has been sent to the pharmacy electronically. ° °

## 2017-04-12 ENCOUNTER — Other Ambulatory Visit: Payer: Self-pay | Admitting: Cardiovascular Disease

## 2017-04-15 NOTE — Telephone Encounter (Signed)
Rx(s) sent to pharmacy electronically.  

## 2017-04-17 ENCOUNTER — Other Ambulatory Visit: Payer: Self-pay

## 2017-04-17 DIAGNOSIS — I10 Essential (primary) hypertension: Secondary | ICD-10-CM

## 2017-04-17 MED ORDER — AMLODIPINE BESYLATE 10 MG PO TABS: 10.0000 mg | ORAL_TABLET | Freq: Every day | ORAL | 3 refills | Status: DC

## 2017-04-24 DIAGNOSIS — Z950 Presence of cardiac pacemaker: Secondary | ICD-10-CM | POA: Diagnosis not present

## 2017-04-24 DIAGNOSIS — Z6841 Body Mass Index (BMI) 40.0 and over, adult: Secondary | ICD-10-CM | POA: Diagnosis not present

## 2017-04-24 DIAGNOSIS — R112 Nausea with vomiting, unspecified: Secondary | ICD-10-CM | POA: Diagnosis not present

## 2017-04-24 DIAGNOSIS — K439 Ventral hernia without obstruction or gangrene: Secondary | ICD-10-CM | POA: Diagnosis not present

## 2017-04-30 ENCOUNTER — Telehealth: Payer: Self-pay

## 2017-04-30 NOTE — Telephone Encounter (Signed)
Pt's son calling requesting Rx for pt to get a new wheelchair. States her current wheelchair is in very bad shape. Call back number [phone] Wallace Cullens, RN

## 2017-05-01 NOTE — Telephone Encounter (Signed)
Patient will need to be evaluated by me in the clinic in order to get approval for the wheelchair replacement. Please advise.  Abigail Wallace, [name], PGY-2

## 2017-05-02 NOTE — Telephone Encounter (Signed)
LMOVM for pt to call us back. Deseree Blount, CMA  

## 2017-05-02 NOTE — Telephone Encounter (Signed)
Pt called to return deserees phone call. I told her that she would need to schedule and appointment and she wasn't happy with any of the date and times that were available. She said she would like Dr Yisroel Ramming to give her a call.

## 2017-05-03 NOTE — Telephone Encounter (Signed)
Called patient and discuss need for formal evaluation to acquire electric wheelchair.  Patient would like to be seen next week.  Scheduled with me on 05/10/2017 at 2:30 PM.  Patient will arrange transportation.  Harriet Butte, [place], PGY-2

## 2017-05-09 ENCOUNTER — Telehealth: Payer: Self-pay

## 2017-05-09 ENCOUNTER — Telehealth: Payer: Self-pay | Admitting: Family Medicine

## 2017-05-09 NOTE — Telephone Encounter (Signed)
Received fax from [place] requesting prior authorization of Diclofenac gel.  Submitted via CoverMyMeds. Status pending. Will check status in 24 hours. Danley Danker, RN [facility] RN)

## 2017-05-09 NOTE — Telephone Encounter (Signed)
PT said she spoke with Dr Yisroel Ramming on the phone and he said he scheduled her  an appointment tomorrow 05/10/17 at 2:00 or 2:30 and she was calling to confirm the time. I told her I did not see any future appointments scheduled for her with Dr Yisroel Ramming. Do we need to schedule her an appointment? She just said she was in pain and did not give any details. She wants someone to call her ASAP in the morning. She has to let transportation know if she needs a ride or not by 1pm

## 2017-05-10 ENCOUNTER — Telehealth: Payer: Self-pay | Admitting: Family Medicine

## 2017-05-10 NOTE — Telephone Encounter (Signed)
Pt called back this morning asking again why her appt was not made for today. Told pt we would send the message to [facility] to see what was going on.   [facility] then had a cancellation at 2:10 for this afternoon and I called pt back to see if pt could come in at 2:10 this afternoon, the original time she thought she had an appt. Pt said she could not come because she had cancelled her transportation and does not have a way to get here, so pt did not make the appt.

## 2017-05-10 NOTE — Telephone Encounter (Signed)
Contacted patient regarding UTI symptoms.  Patient unable to come to clinic for evaluation because she canceled her transportation which usually takes 5 days in advance to schedule.  She does endorse some back pain but is uncertain if this is consistent with her chronic back pain.  She denies history of fevers but does endorse some cold sweats.  Urine has a foul odor.  Patient does not have any reliable family or friends to transport her.  Patient also endorsing shortness of breath without chest pain over the last week.  I advised patient to call 911 so that she could be evaluated in the ED.  Patient reluctant to do so.  Reviewed return precautions.  Went ahead and schedule patient to see Dr. Emmaline Life on 05/17/2017 at 1:30 PM upon patient's request.  Quested patient again call 911 if her symptoms worsen.  Patient understood without further questions or concerns.  Harriet Butte, [place], PGY-2

## 2017-05-10 NOTE — Telephone Encounter (Signed)
Patient advised to go to ED. 

## 2017-05-10 NOTE — Telephone Encounter (Signed)
Patient to call mental and go to ED for evaluation.Contacted patient.  Advised her to call 911 but she insisted on scheduling new office appt on 5/3.  Please see note.

## 2017-05-10 NOTE — Telephone Encounter (Signed)
[place] office has recently spoke with pt.  Will forward to them to document. Timmie Calix, [name], CMA

## 2017-05-14 NOTE — Telephone Encounter (Signed)
Status still pending. Will recheck status in 24 hours. Danley Danker, RN [facility] RN)

## 2017-05-17 ENCOUNTER — Ambulatory Visit (INDEPENDENT_AMBULATORY_CARE_PROVIDER_SITE_OTHER): Payer: Medicare Other | Admitting: Internal Medicine

## 2017-05-17 ENCOUNTER — Encounter: Payer: Self-pay | Admitting: Internal Medicine

## 2017-05-17 DIAGNOSIS — Z993 Dependence on wheelchair: Secondary | ICD-10-CM | POA: Insufficient documentation

## 2017-05-17 DIAGNOSIS — R0781 Pleurodynia: Secondary | ICD-10-CM | POA: Diagnosis present

## 2017-05-17 MED ORDER — DICLOFENAC SODIUM 1 % TD GEL: 4.0000 g | Freq: Four times a day (QID) | TRANSDERMAL | 2 refills | Status: DC | PRN

## 2017-05-17 MED ORDER — BACLOFEN 5 MG PO TABS: 1.0000 | ORAL_TABLET | Freq: Two times a day (BID) | ORAL | 0 refills | Status: DC | PRN

## 2017-05-17 MED ORDER — BACLOFEN 10 MG PO TABS: 10.0000 mg | ORAL_TABLET | Freq: Two times a day (BID) | ORAL | 0 refills | Status: DC | PRN
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
## 2017-05-17 NOTE — Patient Instructions (Signed)
I am going to check an xray for rib pain. Please take Baclofen every 12 hours as needed for pain.

## 2017-05-17 NOTE — Assessment & Plan Note (Signed)
Needs a new wheelchair and would also like a hospital bed. Will send request to PCP

## 2017-05-17 NOTE — Telephone Encounter (Signed)
Phone call to [facility] to follow up. Diclofenac gel approved from 01/13/17-01/14/18. No new approval needed. Phone call to [employer] who sent PA request and they stated med had been filled and not sure why PA fax was generated. Danley Danker, RN [facility] RN)

## 2017-05-20 ENCOUNTER — Telehealth: Payer: Self-pay | Admitting: Family Medicine

## 2017-05-20 NOTE — Telephone Encounter (Signed)
Pt son called and was checking on the status of a form he left Friday for Dr Yisroel Ramming. It is a form for transportation for pt  for the day of her surgery. He said he needs this form filled out by the end of the week. He would like to be contacted when the form has been completed so he knows when to pick it up.

## 2017-05-21 NOTE — Telephone Encounter (Signed)
Pt informed. Pt requested that I mail it to her. Placed in the Price. Deseree Kennon Holter, CMA

## 2017-05-21 NOTE — Telephone Encounter (Signed)
Letter signed and left at front desk ready for pick up.  Harriet Butte, [place], PGY-2

## 2017-05-23 ENCOUNTER — Telehealth: Payer: Self-pay | Admitting: Cardiology

## 2017-05-23 ENCOUNTER — Ambulatory Visit (INDEPENDENT_AMBULATORY_CARE_PROVIDER_SITE_OTHER): Payer: Medicare Other | Admitting: *Deleted

## 2017-05-23 DIAGNOSIS — I495 Sick sinus syndrome: Secondary | ICD-10-CM

## 2017-05-23 NOTE — Telephone Encounter (Signed)
Spoke with pt and reminded pt of remote transmission that is due today. Pt verbalized understanding.   

## 2017-05-23 NOTE — Progress Notes (Signed)
Remote pacemaker transmission.   

## 2017-05-28 ENCOUNTER — Encounter: Payer: Self-pay | Admitting: Cardiology

## 2017-05-29 ENCOUNTER — Telehealth: Payer: Self-pay

## 2017-05-29 NOTE — Telephone Encounter (Signed)
Pt's son calling on behalf of patient- wanting to know if a decision has been made for patient to get a wheelchair and hospital bed. Call back number [phone] Wallace Cullens, RN

## 2017-05-31 ENCOUNTER — Other Ambulatory Visit: Payer: Self-pay | Admitting: Family Medicine

## 2017-05-31 NOTE — Telephone Encounter (Signed)
Patients son to call back on Monday 06/03/17 to set up appt.  Harriet Butte, [place], PGY-2

## 2017-06-05 ENCOUNTER — Ambulatory Visit (INDEPENDENT_AMBULATORY_CARE_PROVIDER_SITE_OTHER): Payer: Medicare Other | Admitting: Cardiovascular Disease

## 2017-06-05 ENCOUNTER — Encounter: Payer: Self-pay | Admitting: Cardiovascular Disease

## 2017-06-05 VITALS — BP 143/76 | HR 74 | Ht 66.0 in | Wt 287.2 lb

## 2017-06-05 DIAGNOSIS — Z950 Presence of cardiac pacemaker: Secondary | ICD-10-CM | POA: Diagnosis not present

## 2017-06-05 DIAGNOSIS — I495 Sick sinus syndrome: Secondary | ICD-10-CM | POA: Diagnosis not present

## 2017-06-05 DIAGNOSIS — I442 Atrioventricular block, complete: Secondary | ICD-10-CM | POA: Diagnosis not present

## 2017-06-05 DIAGNOSIS — R5381 Other malaise: Secondary | ICD-10-CM

## 2017-06-05 DIAGNOSIS — I5032 Chronic diastolic (congestive) heart failure: Secondary | ICD-10-CM

## 2017-06-05 DIAGNOSIS — E7800 Pure hypercholesterolemia, unspecified: Secondary | ICD-10-CM | POA: Diagnosis not present

## 2017-06-05 DIAGNOSIS — I10 Essential (primary) hypertension: Secondary | ICD-10-CM

## 2017-06-05 DIAGNOSIS — F17200 Nicotine dependence, unspecified, uncomplicated: Secondary | ICD-10-CM

## 2017-06-05 DIAGNOSIS — Z01810 Encounter for preprocedural cardiovascular examination: Secondary | ICD-10-CM

## 2017-06-05 MED ORDER — HYDROCHLOROTHIAZIDE 12.5 MG PO TABS: 12.5000 mg | ORAL_TABLET | Freq: Every day | ORAL | 11 refills | Status: DC

## 2017-06-05 NOTE — Patient Instructions (Signed)
Dr Sallyanne Kuster has recommended making the following medication changes: 1. RESTART Hydrochlorothiazide 12.5 mg - take 1 tablet daily  Remote monitoring is used to monitor your Pacemaker or ICD from home. This monitoring reduces the number of office visits required to check your device to one time per year. It allows [place] to keep an eye on the functioning of your device to ensure it is working properly. You are scheduled for a device check from home on Thursday, August 8th, 2019. You may send your transmission at any time that day. If you have a wireless device, the transmission will be sent automatically. After your physician reviews your transmission, you will receive a notification with your next transmission date.  To improve our patient care and to more adequately follow your device, [facility] has decided, as a practice, to start following each patient four times a year with your home monitor. This means that you may experience a remote appointment that is close to an in-office appointment with your physician. Your insurance will apply at the same rate as other remote monitoring transmissions.  Dr Sallyanne Kuster recommends that you schedule a follow-up appointment in 12 months with a pacemaker check. You will receive a reminder letter in the mail two months in advance. If you don't receive a letter, please call our office to schedule the follow-up appointment.  If you need a refill on your cardiac medications before your next appointment, please call your pharmacy.

## 2017-06-05 NOTE — Progress Notes (Signed)
Patient ID: Abigail Wallace, female   DOB: 1945/11/13, 72 y.o.   MRN: [id number]    Cardiology Office Note    Date:  06/07/2017   ID:  [id number] [place], [place] [birth year], MRN [id number]  PCP:  Abigail Bing, DO  Cardiologist:   Abigail Klein, MD   Chief Complaint  Patient presents with  . Follow-up    History of Present Illness:  Abigail Wallace is a 72 y.o. female with complete heart block and sinus node dysfunction who presents for a pacemaker check. As always, her son Abigail Wallace accompanies her today.  Bilateral ankle edema, 2+.  She is on full dose amlodipine.  She has chronic complaints of mild shortness of breath but is extremely sedentary.  She denies orthopnea, PND, angina, dizziness, syncope, focal neurological complaints, palpitations.  She has a large umbilical hernia and has seen Abigail Wallace at [facility], planning surgery.  Interrogation of her pacemaker today shows 64% atrial pacing and 100% ventricular pacing with estimated generator longevity of another 9.5 years. She has rare nonsustained ventricular tachycardia , maximum 10 beats, previously recorded.  She has had a few episodes of atrial mode switch all of them representing atrial tachycardia.  Presenting rhythm today is sinus rhythm with demand V pacing.  Her pacemaker is a dual-chamber Medtronic Adapta device implanted in August 2016 as a generator change out. She is pacemaker dependent due to complete heart block (she has an idioventricular escape rhythm around 30 bpm). She also has significant sinus bradycardia.   She initially received a dual-chamber permanent pacemaker in 2007 for symptomatic AV block. She has minimal coronary atherosclerosis by cardiac catheterization performed in 2007 and no evidence of insufficiency by nuclear perfusion testing in 2012. By echocardiography she has normal left ventricular size and systolic function and no major structural cardiac abnormalities.   Noncardiac history includes  chronic back pain, obesity and orthostatic hypotension. She smokes roughly 5 cigarettes a day and has been diagnosed with COPD, but I don't think this has been formally documented. There is also a mention of a diagnosis of obstructive sleep apnea in her chart. Use CPAP. She has a history of schizophrenia with psychosis requiring hospitalization in 2012. Since then, she has done well and lives independently. Her son, Abigail Wallace ,accompanies her to most visits.   Past Medical History:  Diagnosis Date  . COPD (chronic obstructive pulmonary disease) ([place])   . Coronary artery disease    NON-CRITICAL  . Cystitis    Frequent episodes.   Marland Kitchen GERD (gastroesophageal reflux disease)   . Gout   . Hypertension   . Memory difficulties 12/02/2013  . Mental disorder   . Psychosis ([place])   . Tardive dyskinesia 12/02/2013    Past Surgical History:  Procedure Laterality Date  . CARDIAC CATHETERIZATION  2007   Non-critical.   . CHOLECYSTECTOMY    . EP IMPLANTABLE DEVICE N/A 08/31/2014   Procedure: PPM Generator Changeout;  Surgeon: Abigail Klein, MD;  Location: [facility];  Service: Cardiovascular;  Laterality: N/A;  . INSERT / REPLACE / REMOVE PACEMAKER  2007   [place]/SYMPTOMATIC HEART BLOCK  . PACEMAKER PLACEMENT  09/28/2005   2/2 SSS?  . Persantine stress test  05/01/2010   EF 66%. Normal LV sys fx. Unchanged from previous studies.   . TRANSTHORACIC ECHOCARDIOGRAM  09/08/10   SEVERE CONCENTRIC HYPERTROPHY.LV FUNCTION WAS VIGOROUS.EF 65%-70%.VENTRICULAR SEPTUM-INCOORDINATE MOTION.LEFT ATRIUM-MILDLY DILATED.TRIVIAL TR.  . TUBAL LIGATION      Outpatient Medications Prior to  Visit  Medication Sig Dispense Refill  . acetaminophen (TYLENOL) 500 MG tablet Take 1,000 mg by mouth at bedtime.     Marland Kitchen allopurinol (ZYLOPRIM) 100 MG tablet TAKE 1 TABLET (100 MG TOTAL) BY MOUTH DAILY. 30 tablet 11  . amLODipine (NORVASC) 10 MG tablet Take 1 tablet (10 mg total) by mouth daily. 90 tablet 3  .  amoxicillin-clavulanate (AUGMENTIN) 875-125 MG tablet Take 1 tablet by mouth every 12 (twelve) hours. 14 tablet 0  . Baclofen 5 MG TABS Take 1 tablet by mouth every 12 (twelve) hours as needed. 30 tablet 0  . divalproex (DEPAKOTE ER) 250 MG 24 hr tablet Take 250 mg by mouth at bedtime.     . Incontinence Supplies MISC 1 Units by Does not apply route as needed. 100 each prn  . losartan (COZAAR) 100 MG tablet TAKE 1 TABLET BY MOUTH DAILY 90 tablet 1  . metoprolol tartrate (LOPRESSOR) 50 MG tablet TAKE 1 TABLET BY MOUTH TWICE A DAY (Patient taking differently: TAKE 1 TABLET (50mg ) BY MOUTH TWICE A DAY) 60 tablet 11  . nitroGLYCERIN (NITROSTAT) 0.4 MG SL tablet Place 1 tablet (0.4 mg total) under the tongue every 5 (five) minutes x 3 doses as needed. For chest 25 tablet 1  . nystatin cream (MYCOSTATIN) Apply 1 application topically 2 (two) times daily. 30 g 3  . OLANZapine (ZYPREXA) 10 MG tablet Take 10 mg by mouth at bedtime.  0  . pravastatin (PRAVACHOL) 40 MG tablet TAKE 1 TABLET BY MOUTH DAILY IN THE EVENING 30 tablet 6  . QUEtiapine (SEROQUEL) 25 MG tablet Take 25 mg by mouth at bedtime.   0  . diclofenac sodium (VOLTAREN) 1 % GEL Apply 4 g topically 4 (four) times daily as needed (joint pain). 100 g 2   No facility-administered medications prior to visit.      Allergies:   Abilify [aripiprazole]; Clozapine; Benadryl [diphenhydramine hcl]; Cogentin [benztropine]; Haloperidol lactate; Latuda [lurasidone hcl]; Remeron [mirtazapine]; Codeine; and Latex   Social History   Socioeconomic History  . Marital status: Divorced    Spouse name: Not on file  . Number of children: Not on file  . Years of education: Not on file  . Highest education level: Not on file  Occupational History  . Not on file  Social Needs  . Financial resource strain: Not on file  . Food insecurity:    Worry: Not on file    Inability: Not on file  . Transportation needs:    Medical: Not on file    Non-medical: Not on  file  Tobacco Use  . Smoking status: Current Some Day Smoker    Packs/day: 0.25    Types: Cigarettes  . Smokeless tobacco: Never Used  . Tobacco comment: 6 cigs a day  Substance and Sexual Activity  . Alcohol use: No    Alcohol/week: 0.0 oz  . Drug use: No  . Sexual activity: Not on file  Lifestyle  . Physical activity:    Days per week: Not on file    Minutes per session: Not on file  . Stress: Not on file  Relationships  . Social connections:    Talks on phone: Not on file    Gets together: Not on file    Attends religious service: Not on file    Active member of club or organization: Not on file    Attends meetings of clubs or organizations: Not on file    Relationship status: Not on file  Other Topics Concern  . Not on file  Social History Narrative   Lives alone. Older sister lives nearby and helps take her to appointments and to take care of her; sister uses wheelchair, unsure why (thin and appears healthy).    Married.   Children: 2 daughters, 1 son; 75 GC, 5 GGC.    Occupation: disabled.    Caffeine:   Tobacco: 5-6/day   Denies alcohol.      Family History:  The patient's family history includes Diabetes type II in her son; Heart disease in her father and mother; Kidney disease in her father and mother; Stroke in her sister.   ROS:   Please see the history of present illness.    Review of Systems  Constitution: Negative for chills, decreased appetite and fever.  HENT: Negative for nosebleeds.   Eyes: Negative for visual disturbance.  Cardiovascular: Positive for leg swelling. Negative for chest pain, irregular heartbeat, orthopnea, palpitations and syncope.  Respiratory: Positive for snoring. Negative for cough, hemoptysis and wheezing.   Endocrine: Negative for cold intolerance, heat intolerance, polydipsia, polyphagia and polyuria.  Hematologic/Lymphatic: Does not bruise/bleed easily.  Skin: Negative for itching and rash.  Musculoskeletal: Positive for  arthritis, back pain, joint pain, muscle weakness and neck pain.  Gastrointestinal: Negative for abdominal pain, change in bowel habit, hematemesis, melena, nausea and vomiting.  Genitourinary: Negative for frequency, hematuria, non-menstrual bleeding and urgency.  Neurological: Positive for difficulty with concentration and tremors. Negative for headaches.  Psychiatric/Behavioral: Negative for depression and hallucinations.  Allergic/Immunologic: Negative.    All other systems reviewed and are negative.   PHYSICAL EXAM:   VS:  BP (!) 143/76   Pulse 74   Ht 5\' 6"  (1.676 m)   Wt 287 lb 3.2 oz (130.3 kg)   BMI 46.36 kg/m      General: Alert, oriented x3, no distress, morbid obesity limits her examination Head: no evidence of trauma, PERRL, EOMI, no exophtalmos or lid lag, no myxedema, no xanthelasma; normal ears, nose and oropharynx Neck: normal jugular venous pulsations and no hepatojugular reflux; brisk carotid pulses without delay and no carotid bruits Chest: clear to auscultation, no signs of consolidation by percussion or palpation, normal fremitus, symmetrical and full respiratory excursions, well-healed pacemaker site Cardiovascular: normal position and quality of the apical impulse, regular rhythm, normal first and paradoxically split second heart sounds, no murmurs, rubs or gallops Abdomen: no tenderness or distention, no masses by palpation, no abnormal pulsatility or arterial bruits, normal bowel sounds, no hepatosplenomegaly Extremities: no clubbing, cyanosis; 2+ right ankle edema, 3+ left ankle edema; 2+ radial, ulnar and brachial pulses bilaterally; 2+ right femoral, posterior tibial and dorsalis pedis pulses; 2+ left femoral, posterior tibial and dorsalis pedis pulses; no subclavian or femoral bruits Neurological: grossly nonfocal, pill-rolling Psych: Normal mood and affect   Wt Readings from Last 3 Encounters:  06/05/17 287 lb 3.2 oz (130.3 kg)  05/17/17 287 lb (130.2  kg)  10/11/16 292 lb (132.5 kg)      Studies/Labs Reviewed:   EKG:  EKG is ordered today.  The ekg ordered today demonstrates atrial sensed (sinus), ventricular paced rhythm with long AV delay 228 ms.  Normal QTC 443 ms.  Recent Labs: 09/11/2016: ALT 16 01/15/2017: BUN 30; Creatinine, Ser 1.49; Hemoglobin 12.2; Platelets 296; Potassium 4.4; Sodium 136  Lipid Panel     Component Value Date/Time   CHOL 122 11/19/2010 0605   TRIG 73 11/19/2010 0605   HDL 45 11/19/2010 0605   CHOLHDL  2.7 11/19/2010 0605   VLDL 15 11/19/2010 0605   LDLCALC 62 11/19/2010 0605   LDLDIRECT 80 03/20/2012 1510     ASSESSMENT:    1. Chronic diastolic heart failure ([place])   2. Essential hypertension   3. Sick sinus syndrome ([place])   4. CHB (complete heart block) ([facility])   5. Pacemaker   6. Hypercholesterolemia   7. Smoking   8. Physical deconditioning   9. Morbid obesity ([place])   10. Preoperative cardiovascular examination      PLAN:  In order of problems listed above:  1. CHF: She seems to be mildly hypervolemic.  We will add diuretics.  Reviewed the importance of sodium restriction.  Amlodipine is contributing to the edema as does her sedentary status, always sitting in a chair.  Try to keep legs elevated. She has asymmetrical leg edema: previous lower extremity venous duplex has not shown evidence of DVT. 2. HTN: Borderline control, should improve with diuretics 3. SSS: Further sedentary lifestyle but heart rate histogram distribution is appropriate 4. CHB: She has a very slow idioventricular escape rhythm which has not always been present when tested. She should be considered pacemaker dependent. 5. PPM: Normal pacemaker function. [facility] downloads every 3 months and office visit in 6 months. 6. HLP: Did not have a lipid profile as planned.  On pravastatin. 7. Smoking: Strongly encouraged that she quit smoking again.  8. Deconditioning: Severe deconditioning.  Very reluctant to move  around. 9. Obesity: Discussed the need for some physical activity and reduction in intake of carbohydrates and calories 10. Preop CV eval: She has mild edema, but otherwise appears well compensated.  She is not in overt congestive heart failure.  I do not think she is at high risk for major cardiovascular complications with the planned umbilical hernia repair.  I share the concern for increased risk of DVT/PE due to her sedentary behavior and morbid obesity.  I understand that there are plans for anticoagulation after surgery.    Medication Adjustments/Labs and Tests Ordered: Current medicines are reviewed at length with the patient today.  Concerns regarding medicines are outlined above.  Medication changes, Labs and Tests ordered today are listed in the Patient Instructions below. Patient Instructions  Dr Sallyanne Kuster has recommended making the following medication changes: 1. RESTART Hydrochlorothiazide 12.5 mg - take 1 tablet daily  Remote monitoring is used to monitor your Pacemaker or ICD from home. This monitoring reduces the number of office visits required to check your device to one time per year. It allows [place] to keep an eye on the functioning of your device to ensure it is working properly. You are scheduled for a device check from home on Thursday, August 8th, 2019. You may send your transmission at any time that day. If you have a wireless device, the transmission will be sent automatically. After your physician reviews your transmission, you will receive a notification with your next transmission date.  To improve our patient care and to more adequately follow your device, [facility] has decided, as a practice, to start following each patient four times a year with your home monitor. This means that you may experience a remote appointment that is close to an in-office appointment with your physician. Your insurance will apply at the same rate as other remote monitoring  transmissions.  Dr Sallyanne Kuster recommends that you schedule a follow-up appointment in 12 months with a pacemaker check. You will receive a reminder letter in the mail two months in advance. If  you don't receive a letter, please call our office to schedule the follow-up appointment.  If you need a refill on your cardiac medications before your next appointment, please call your pharmacy.      Signed, Abigail Klein, MD  06/07/2017 9:36 AM    [facility] [address], [place], [place]  [address] Phone: [phone]; Fax: [phone]

## 2017-06-06 ENCOUNTER — Telehealth: Payer: Self-pay

## 2017-06-06 NOTE — Telephone Encounter (Signed)
Pt's son calling for pt. States she got a rx for diclofenac gel, only got a 100g tube and she usually has rx for a 200g tube. Requesting new rx for 200g tube be sent to pharmacy Call back number [phone] Wallace Cullens, RN

## 2017-06-07 ENCOUNTER — Other Ambulatory Visit: Payer: Self-pay | Admitting: Family Medicine

## 2017-06-07 MED ORDER — DICLOFENAC SODIUM 1 % TD GEL: 4.0000 g | Freq: Four times a day (QID) | TRANSDERMAL | 2 refills | Status: DC | PRN

## 2017-06-07 NOTE — Telephone Encounter (Signed)
Rx sent.  Abigail Wallace, [place], PGY-2

## 2017-06-13 DIAGNOSIS — Z79899 Other long term (current) drug therapy: Secondary | ICD-10-CM | POA: Diagnosis not present

## 2017-06-19 LAB — CUP PACEART REMOTE DEVICE CHECK
Battery Impedance: 158 Ohm
Battery Voltage: 2.79 V
Brady Statistic AP VP Percent: 64 %
Brady Statistic AP VS Percent: 0 %
Brady Statistic AS VP Percent: 36 %
Implantable Lead Implant Date: 20070914
Implantable Lead Model: 4092
Implantable Lead Model: 5594
Lead Channel Impedance Value: 500 Ohm
Lead Channel Pacing Threshold Amplitude: 0.625 V
Lead Channel Pacing Threshold Amplitude: 0.625 V
Lead Channel Pacing Threshold Pulse Width: 0.4 ms
Lead Channel Setting Pacing Amplitude: 2.5 V
Lead Channel Setting Pacing Pulse Width: 0.4 ms
MDC IDC LEAD IMPLANT DT: 20070414
MDC IDC LEAD LOCATION: 753859
MDC IDC LEAD LOCATION: 753860
MDC IDC MSMT BATTERY REMAINING LONGEVITY: 116 mo
MDC IDC MSMT LEADCHNL RV IMPEDANCE VALUE: 631 Ohm
MDC IDC MSMT LEADCHNL RV PACING THRESHOLD PULSEWIDTH: 0.4 ms
MDC IDC PG IMPLANT DT: 20160816
MDC IDC SESS DTM: 20190509182709
MDC IDC SET LEADCHNL RA PACING AMPLITUDE: 2 V
MDC IDC SET LEADCHNL RV SENSING SENSITIVITY: 4 mV
MDC IDC STAT BRADY AS VS PERCENT: 0 %

## 2017-07-08 ENCOUNTER — Other Ambulatory Visit: Payer: Self-pay | Admitting: Internal Medicine

## 2017-07-08 DIAGNOSIS — R0781 Pleurodynia: Secondary | ICD-10-CM

## 2017-07-12 ENCOUNTER — Encounter: Payer: Self-pay | Admitting: Family Medicine

## 2017-07-12 ENCOUNTER — Ambulatory Visit (INDEPENDENT_AMBULATORY_CARE_PROVIDER_SITE_OTHER): Payer: Medicare Other | Admitting: Family Medicine

## 2017-07-12 VITALS — BP 110/75 | HR 80 | Temp 99.2°F | Wt 285.6 lb

## 2017-07-12 DIAGNOSIS — Z7409 Other reduced mobility: Secondary | ICD-10-CM | POA: Diagnosis present

## 2017-07-12 DIAGNOSIS — R1310 Dysphagia, unspecified: Secondary | ICD-10-CM | POA: Diagnosis not present

## 2017-07-12 DIAGNOSIS — R300 Dysuria: Secondary | ICD-10-CM

## 2017-07-12 DIAGNOSIS — Z7401 Bed confinement status: Secondary | ICD-10-CM | POA: Insufficient documentation

## 2017-07-12 LAB — POCT UA - MICROSCOPIC ONLY

## 2017-07-12 LAB — POCT URINALYSIS DIP (MANUAL ENTRY)
Bilirubin, UA: NEGATIVE
Ketones, POC UA: NEGATIVE mg/dL
LEUKOCYTES UA: NEGATIVE
Nitrite, UA: NEGATIVE
Protein Ur, POC: >=300 mg/dL — AB
SPEC GRAV UA: 1.025 (ref 1.010–1.025)
UROBILINOGEN UA: 0.2 U/dL
pH, UA: 5.5 (ref 5.0–8.0)

## 2017-07-12 NOTE — Progress Notes (Signed)
Subjective   Patient ID: Abigail Wallace    DOB: Sep 22, 1945, 71 y.o. female   MRN: [id number]  CC: "Need hospital bed"  HPI: Abigail Wallace is a 72 y.o. female who presents to clinic today for the following:  Dysphasia: Patient endorsing sensation of inability to swallow food completely with both solids and liquids over the past 6 months.  Patient's son states she had a proximally 4 episodes of coughing over the last couple of weeks.  She is supposed to wear dentures but does not wear them.  Patient states they do not fit her properly and she does not want to see a dentist.  They are not interested in seeing speech therapy but wants to be evaluated by a gastroenterologist.  Patient denies history of fevers or chills, chronic cough, shortness of breath, chest pain, abdominal pain.  She does have a known hernia which is in the process of getting approval for surgical correction.  Dysuria: She has a long-standing history of dysuria.  Patient states she has been having burning sensation and has been urinating frequently.  Son states he can usually tell when she has an infection.  She usually receives Bactrim which resolves the issue.  Mobility impairment: Patient is awaiting placement standard wheelchair and a hospital bed.  She is chronically deconditioned and has no heart failure along with several other comorbidities.  She is unsafe with a standard bed and needs handrails along with the ability to elevate head of her bed.  She cannot use an IT trainer wheelchair.  Patient lives at home alone but is really visited by her son.  Does the patient require and use a wheelchair to move around in the home?  yes  Does the patient have quadriplegia or a fixed hip angle?  no  Does the patient have a trunk or brace cast or other brace that requires a reclining back feature for positioning? no  Does the patient have excessive extensor tone of the trunk muscles?  no  Does the patient need to rest in a  recumbent position two or more times a day? no  The patient has the following condition(s) that prevent a 90 degree flexion of the knee: no  Does the patient have a need for arm height different than available using non-adjustable arms? no  How many hours per day does the patient usually spend in the  wheelchair? ( round up to the next hour )  24 hrs.  Is the patient able to adequately self-propel in the standard weight wheelchair?  no  If not, would the patient be able to adequately self propel in the  wheelchair being ordered?  yes  The answers to the above questions were provided by:  Patient and son  ROS: see HPI for pertinent.  [place]: HTN, HFpEF, CAD, complete heart block with pacemaker, hypothyroidism, COPD, OSA with CPAP, ventral hernia, gout. Surgical history pacemaker placement, cardiac cath (2007), cholecystectomy, tubal ligation. Family history heart disease, CKD, stroke, DM. Smoking status reviewed. Medications reviewed.  Objective   BP 110/75 (BP Location: Left Wrist, Patient Position: Sitting, Cuff Size: Normal)   Pulse 80   Temp 99.2 F (37.3 C) (Oral)   Wt 285 lb 9.6 oz (129.5 kg)   SpO2 97%   BMI 46.10 kg/m  Vitals and nursing note reviewed.  General: pleasant elderly female in wheelchair, well developed, NAD with non-toxic appearance HEENT: normocephalic, atraumatic, moist mucous membranes Neck: supple, non-tender without lymphadenopathy, no JVD Cardiovascular: regular rate and  rhythm without murmurs, rubs, or gallops Lungs: clear to auscultation bilaterally with normal work of breathing Abdomen: soft, non-tender, non-distended, normoactive bowel sounds Skin: warm, dry, no rashes or lesions, cap refill < 2 seconds Extremities: warm and well perfused, normal tone, chronic 2+ edema of right ankle and 3+ edema of left ankle, 2+ dorsal pedal pulse present Psych: euthymic mood, congruent affect  Assessment & Plan   Mobility impaired Chronic.  Secondary  to comorbidities and severe deconditioned state.  Patient also has long-standing heart failure and is dependent on wheelchair for ambulation.  Patient also is appropriate for hospital bed given need for elevation of head at 30 degrees along with need for bed rails. - DME for hospital bed and standard wheelchair placed, message sent to Darlina Guys with [facility]  Dysuria Acute on chronic.  UA with microscopy negative for infection.  Patient has a long history of dysuria complaints and is fixated on constant urinary infections. - Will proceed with urine culture and hold treatment - Reviewed return precautions  Dysphagia Chronic.  New complaint today.  Seems to affect primarily solids.  Suspect this is secondary to nonadherence to denture use.  Patient declined referral to speech therapy and adamant about need for GI referral.  Discussed with patient's son who is caregiver who is in agreement with patient. - Ambulatory referral to GI - Advised to restrict to soft diet with adequate chewing between feeds and emphasized importance of adherence to denture use - Advised patient to schedule appointment with dentist to receive new dentures  Orders Placed This Encounter  Procedures  . DME Wheelchair manual    Patient suffers from several chronic comorbidities including severe deconditioned state which impairs their ability to perform daily activities like bathing, dressing, feeding, grooming and toileting in the home.  A cane, crutch or walker will not resolve  issue with performing activities of daily living. A wheelchair will allow patient to safely perform daily activities. Patient can safely propel the wheelchair in the home or has a caregiver who can provide assistance.  Accessories: elevating leg rests (ELRs), wheel locks, extensions and anti-tippers.  . DME Hospital bed    Order Specific Question:   Head must be elevated greater than:    Answer:   30 degrees    Order Specific Question:   Bed type      Answer:   Semi-electric  . Urine Culture  . Ambulatory referral to Gastroenterology    Referral Priority:   Routine    Referral Type:   Consultation    Referral Reason:   Specialty Services Required    Number of Visits Requested:   1  . POCT urinalysis dipstick  . POCT UA - Microscopic Only   No orders of the defined types were placed in this encounter.   Harriet Butte, [place], PGY-2 07/13/2017, 1:26 PM

## 2017-07-12 NOTE — Patient Instructions (Signed)
Thank you for coming in to see [place] today. Please see below to review our plan for today's visit.  1.  I have placed a referral for a GI evaluation concerning the difficulty swallowing.  I emphasized the importance of wearing her dentures.  You need to contact dentist to have these reevaluated.  Important that you eat soft foods to prevent issues with swallowing.  Do not eat too much food at once and make sure you have an effective swallow before taking another bite. 2.  I have placed an order for the hospital bed in the wheelchair.  You guys should expect to receive a call and ask 2 weeks regarding appointment scheduling to have these approved. 3.  Your urine does not show signs of an infection.  We will culture it which may take several days.  I will call you if there is any abnormalities.  Please call the clinic at [phone] if your symptoms worsen or you have any concerns. It was our pleasure to serve you.  Harriet Butte, [place], PGY-2

## 2017-07-13 NOTE — Assessment & Plan Note (Signed)
Chronic.  New complaint today.  Seems to affect primarily solids.  Suspect this is secondary to nonadherence to denture use.  Patient declined referral to speech therapy and adamant about need for GI referral.  Discussed with patient's son who is caregiver who is in agreement with patient. - Ambulatory referral to GI - Advised to restrict to soft diet with adequate chewing between feeds and emphasized importance of adherence to denture use - Advised patient to schedule appointment with dentist to receive new dentures

## 2017-07-13 NOTE — Assessment & Plan Note (Signed)
Acute on chronic.  UA with microscopy negative for infection.  Patient has a long history of dysuria complaints and is fixated on constant urinary infections. - Will proceed with urine culture and hold treatment - Reviewed return precautions

## 2017-07-13 NOTE — Assessment & Plan Note (Addendum)
Chronic.  Secondary to comorbidities and severe deconditioned state.  Patient also has long-standing heart failure and is dependent on wheelchair for ambulation.  Patient also is appropriate for hospital bed given need for elevation of head at 30 degrees along with need for bed rails. - DME for hospital bed and standard wheelchair placed, message sent to Darlina Guys with [facility]

## 2017-07-14 LAB — URINE CULTURE

## 2017-07-15 ENCOUNTER — Encounter: Payer: Self-pay | Admitting: Family Medicine

## 2017-07-16 ENCOUNTER — Telehealth: Payer: Self-pay | Admitting: Family Medicine

## 2017-07-16 NOTE — Telephone Encounter (Signed)
Pt would like Dr. Yisroel Ramming to call her.

## 2017-07-19 ENCOUNTER — Encounter: Payer: Self-pay | Admitting: Gastroenterology

## 2017-07-19 ENCOUNTER — Other Ambulatory Visit: Payer: Self-pay | Admitting: Cardiovascular Disease

## 2017-07-19 DIAGNOSIS — F200 Paranoid schizophrenia: Secondary | ICD-10-CM | POA: Diagnosis not present

## 2017-07-22 ENCOUNTER — Encounter: Payer: Self-pay | Admitting: Family Medicine

## 2017-08-01 ENCOUNTER — Other Ambulatory Visit: Payer: Self-pay | Admitting: Cardiovascular Disease

## 2017-08-01 NOTE — Telephone Encounter (Signed)
Rx sent to pharmacy   

## 2017-08-05 ENCOUNTER — Other Ambulatory Visit: Payer: Self-pay | Admitting: Family Medicine

## 2017-08-05 ENCOUNTER — Other Ambulatory Visit: Payer: Self-pay | Admitting: Cardiovascular Disease

## 2017-08-05 DIAGNOSIS — R0781 Pleurodynia: Secondary | ICD-10-CM

## 2017-08-07 ENCOUNTER — Ambulatory Visit: Payer: Medicare Other | Admitting: Gastroenterology

## 2017-08-22 ENCOUNTER — Encounter: Payer: Medicare Other | Admitting: *Deleted

## 2017-08-23 ENCOUNTER — Telehealth: Payer: Self-pay

## 2017-08-23 NOTE — Telephone Encounter (Signed)
Attempted to confirm remote transmission with pt. No answer and was unable to leave a message.   

## 2017-08-27 ENCOUNTER — Encounter: Payer: Self-pay | Admitting: Cardiology

## 2017-09-02 ENCOUNTER — Ambulatory Visit (INDEPENDENT_AMBULATORY_CARE_PROVIDER_SITE_OTHER): Payer: Medicare Other | Admitting: *Deleted

## 2017-09-02 DIAGNOSIS — I495 Sick sinus syndrome: Secondary | ICD-10-CM | POA: Diagnosis not present

## 2017-09-03 NOTE — Progress Notes (Signed)
Remote pacemaker transmission.   

## 2017-09-09 ENCOUNTER — Other Ambulatory Visit: Payer: Self-pay | Admitting: Family Medicine

## 2017-09-09 DIAGNOSIS — R0781 Pleurodynia: Secondary | ICD-10-CM

## 2017-09-12 ENCOUNTER — Other Ambulatory Visit: Payer: Self-pay | Admitting: Internal Medicine

## 2017-09-12 ENCOUNTER — Other Ambulatory Visit: Payer: Self-pay | Admitting: Family Medicine

## 2017-09-12 DIAGNOSIS — R0781 Pleurodynia: Secondary | ICD-10-CM

## 2017-09-27 LAB — CUP PACEART REMOTE DEVICE CHECK
Battery Remaining Longevity: 116 mo
Battery Voltage: 2.79 V
Brady Statistic AP VP Percent: 62 %
Brady Statistic AS VP Percent: 38 %
Date Time Interrogation Session: 20190819141517
Implantable Lead Implant Date: 20070414
Implantable Lead Implant Date: 20070914
Implantable Lead Model: 5594
Implantable Pulse Generator Implant Date: 20160816
Lead Channel Pacing Threshold Amplitude: 0.625 V
Lead Channel Sensing Intrinsic Amplitude: 1.4 mV
Lead Channel Setting Pacing Amplitude: 2.5 V
Lead Channel Setting Pacing Pulse Width: 0.4 ms
Lead Channel Setting Sensing Sensitivity: 4 mV
MDC IDC LEAD LOCATION: 753859
MDC IDC LEAD LOCATION: 753860
MDC IDC MSMT BATTERY IMPEDANCE: 158 Ohm
MDC IDC MSMT LEADCHNL RA IMPEDANCE VALUE: 494 Ohm
MDC IDC MSMT LEADCHNL RA PACING THRESHOLD PULSEWIDTH: 0.4 ms
MDC IDC MSMT LEADCHNL RV IMPEDANCE VALUE: 631 Ohm
MDC IDC MSMT LEADCHNL RV PACING THRESHOLD AMPLITUDE: 0.625 V
MDC IDC MSMT LEADCHNL RV PACING THRESHOLD PULSEWIDTH: 0.4 ms
MDC IDC SET LEADCHNL RA PACING AMPLITUDE: 2 V
MDC IDC STAT BRADY AP VS PERCENT: 0 %
MDC IDC STAT BRADY AS VS PERCENT: 0 %

## 2017-10-02 DIAGNOSIS — N289 Disorder of kidney and ureter, unspecified: Secondary | ICD-10-CM | POA: Diagnosis not present

## 2017-10-02 DIAGNOSIS — N281 Cyst of kidney, acquired: Secondary | ICD-10-CM | POA: Diagnosis not present

## 2017-10-17 DIAGNOSIS — R300 Dysuria: Secondary | ICD-10-CM | POA: Diagnosis not present

## 2017-10-17 DIAGNOSIS — R8271 Bacteriuria: Secondary | ICD-10-CM | POA: Diagnosis not present

## 2017-10-17 DIAGNOSIS — N281 Cyst of kidney, acquired: Secondary | ICD-10-CM | POA: Diagnosis not present

## 2017-10-17 DIAGNOSIS — K439 Ventral hernia without obstruction or gangrene: Secondary | ICD-10-CM | POA: Diagnosis not present

## 2017-11-14 DIAGNOSIS — F200 Paranoid schizophrenia: Secondary | ICD-10-CM | POA: Diagnosis not present

## 2017-11-21 ENCOUNTER — Other Ambulatory Visit: Payer: Self-pay | Admitting: Family Medicine

## 2017-12-02 ENCOUNTER — Telehealth: Payer: Self-pay

## 2017-12-02 ENCOUNTER — Ambulatory Visit (INDEPENDENT_AMBULATORY_CARE_PROVIDER_SITE_OTHER): Payer: Medicare Other

## 2017-12-02 DIAGNOSIS — I495 Sick sinus syndrome: Secondary | ICD-10-CM | POA: Diagnosis not present

## 2017-12-02 NOTE — Telephone Encounter (Signed)
Spoke with pt and reminded pt of remote transmission that is due today. Pt verbalized understanding.   

## 2017-12-04 NOTE — Progress Notes (Signed)
Remote pacemaker transmission.   

## 2017-12-05 ENCOUNTER — Encounter: Payer: Self-pay | Admitting: Cardiology

## 2017-12-23 ENCOUNTER — Other Ambulatory Visit: Payer: Self-pay | Admitting: Family Medicine

## 2017-12-23 ENCOUNTER — Telehealth: Payer: Self-pay

## 2017-12-23 NOTE — Telephone Encounter (Signed)
Pt has decided that she does need an inhaler now. She used to use one all the time and thought she could get by without using one, Can we call in a inhaler to her pharmacy. jw

## 2017-12-23 NOTE — Telephone Encounter (Signed)
Pt son is calling to return Shelly's phone call. I told him it was noted that she needs to schedule an appointment. He stated he does not understand why she would need to come back in. He would like for Shelly to call him back.

## 2017-12-23 NOTE — Telephone Encounter (Signed)
Patient needs to come to clinic for pulmonary fuunction testing with the guidance of Dr. Valentina Lucks first.  If patient has trouble breathing or is wheezing or has sputum production, she should first be evaluated here at clinic by physician. Please advise.  Harriet Butte, [place], PGY-3

## 2017-12-23 NOTE — Telephone Encounter (Signed)
Spoke to patient's son [name] and he does not understand why his mother needs to come in for a reevaluation just so she can get an inhaler.  Explained that per Dr. Yisroel Ramming she needed to be seen if she was wheezing and had a productive cough with sputum.  He confirmed both and says that she needs the inhaler not an appointment.  It is very hard for her to travel back and forth to appointments and it is also hard on her physically. They need at least 7 days to arrange transportation.  [name] also states that he would very much like for a permanent on staff doctor for his mother's care. According to him her mental status is altered and it would help tremendously to have consistency.  Told patient that he would need to talk to [facility] because I could not change her PCP from a resident to a staff physician.  Ozella Almond, [place]

## 2017-12-23 NOTE — Telephone Encounter (Signed)
Called patient to clarify whether she needed an appointment with Dr. Valentina Lucks or a physician.  Patient states that she is producing sputum and needs to see a doctor but has to check with her son first to see when he can bring her in.  He is not at home now. When she can verify with him she will call back and schedule an appointment.  Abigail Wallace, [place]

## 2018-01-17 ENCOUNTER — Ambulatory Visit: Payer: Medicare Other | Admitting: Family Medicine

## 2018-01-24 ENCOUNTER — Ambulatory Visit (INDEPENDENT_AMBULATORY_CARE_PROVIDER_SITE_OTHER): Payer: Medicare Other | Admitting: Family Medicine

## 2018-01-24 ENCOUNTER — Telehealth: Payer: Self-pay

## 2018-01-24 ENCOUNTER — Other Ambulatory Visit: Payer: Self-pay

## 2018-01-24 ENCOUNTER — Encounter: Payer: Self-pay | Admitting: Family Medicine

## 2018-01-24 VITALS — BP 146/82 | HR 87 | Temp 99.0°F | Wt 289.0 lb

## 2018-01-24 DIAGNOSIS — J449 Chronic obstructive pulmonary disease, unspecified: Secondary | ICD-10-CM

## 2018-01-24 DIAGNOSIS — E039 Hypothyroidism, unspecified: Secondary | ICD-10-CM

## 2018-01-24 DIAGNOSIS — K429 Umbilical hernia without obstruction or gangrene: Secondary | ICD-10-CM | POA: Diagnosis not present

## 2018-01-24 DIAGNOSIS — B351 Tinea unguium: Secondary | ICD-10-CM | POA: Diagnosis not present

## 2018-01-24 DIAGNOSIS — F17200 Nicotine dependence, unspecified, uncomplicated: Secondary | ICD-10-CM

## 2018-01-24 DIAGNOSIS — Z993 Dependence on wheelchair: Secondary | ICD-10-CM

## 2018-01-24 DIAGNOSIS — E038 Other specified hypothyroidism: Secondary | ICD-10-CM

## 2018-01-24 DIAGNOSIS — I10 Essential (primary) hypertension: Secondary | ICD-10-CM | POA: Diagnosis not present

## 2018-01-24 DIAGNOSIS — R300 Dysuria: Secondary | ICD-10-CM | POA: Diagnosis not present

## 2018-01-24 MED ORDER — ALBUTEROL SULFATE HFA 108 (90 BASE) MCG/ACT IN AERS: 2.0000 | INHALATION_SPRAY | Freq: Four times a day (QID) | RESPIRATORY_TRACT | 2 refills | Status: DC | PRN

## 2018-01-24 MED ORDER — GUAIFENESIN 100 MG/5ML PO SOLN: 5.0000 mL | ORAL | 0 refills | Status: DC | PRN

## 2018-01-24 MED ORDER — TIOTROPIUM BROMIDE MONOHYDRATE 18 MCG IN CAPS: 18.0000 ug | ORAL_CAPSULE | Freq: Every day | RESPIRATORY_TRACT | 12 refills | Status: DC

## 2018-01-24 MED ORDER — UMECLIDINIUM BROMIDE 62.5 MCG/INH IN AEPB: 1.0000 | INHALATION_SPRAY | Freq: Every day | RESPIRATORY_TRACT | 3 refills | Status: DC

## 2018-01-24 NOTE — Telephone Encounter (Signed)
Called son and informed him of RX.  He verbalizes understanding and is most appreciative to Dr. [name].  Ozella Almond, [place]

## 2018-01-24 NOTE — Patient Instructions (Signed)
It was wonderful to see you today.  Thank you for choosing [facility].   Please call [phone] with any questions about today's appointment.  Please be sure to schedule follow up at the front  desk before you leave today.   Skarlet Lyons, MD  Family Medicine    

## 2018-01-24 NOTE — Telephone Encounter (Signed)
Incruse ellipta sent to pharmacy. Please call son and let him know.   Thank you, [name], MD  [facility] Medicine Teaching Service

## 2018-01-24 NOTE — Telephone Encounter (Signed)
Fax from [employer] that Spiriva needs PA. According to CoverMyMeds this is non-formulary.  Preferred alternatives are:  Anoro Ellipta Breo Ellipta Incruse Ellipta  Please Rx alternative or let RN clinic know if you would like to attempt a PA on Spiriva.  Danley Danker, RN [facility] Clinic RN)

## 2018-01-24 NOTE — Progress Notes (Signed)
Patient Name: Abigail Wallace Date of Birth: 05/03/1945 Date of Visit: 01/24/18 PCP: Tarlton Bing, DO  Chief Complaint: foul smelling urine   Subjective: Abigail Wallace is a pleasant 73 y.o. year old with history of schizophrenia, tardive dyskinesia, coronary artery disease, suspected chronic obstructive pulmonary disease without documented spirometry, obesity, tobacco abuse, and cognitive impairment presenting today for routine follow-up.  She is joined by her son Abigail Wallace.  The patient and her son have several concerns today.  Medications Patient is followed by Psychiatry. She is on Zyprex and Seroquel as well as Depakote. Mood has been okay. Appetite okay as well. Her anxiety is increased with going to see physician.   Foul smelling urine This has been ongoing 3 days. Waxes and wanes. No dsyuria, hematuria, abdominal pain, fevers. She had an E. Coli UTI last documented in 2015.   Cough The patient reports congestion and cough. This has been ongoing for years, worse in the last month. She has stopped all of her inhalers. No chest pain, nausea, vomiting, fevers, confusion. She is eating and drink well. She has a baseline cough. She is cut down to 6 cigarettes per day.   ADLS Lives alone in senior apartment, single floor. Dependent on son for dressing, bathing, hygiene. Feeds herself. She is non-ambulatory due to her history of polio and severe DJD of the spine. She declines PT for her LE Abigail Wallace is too anxious to try to walk at this time.   ROS:  ROS As above. Negative for headaches, chest pain.  I have reviewed the patient's medical, surgical, family, and social history as appropriate.   Vitals:   01/24/18 1112  BP: (!) 146/82  Pulse: 87  Temp: 99 F (37.2 C)  SpO2: 97%   Filed Weights   01/24/18 1112  Weight: 289 lb (131.1 kg)   HEENT: Sclera anicteric. Dentition is absent, has dentures, does not wish to wear them. Appears well hydrated. Neck: Supple Cardiac:  Regular rate and rhythm. Normal S1/S2. No murmurs, rubs, or gallops appreciated. Lungs: Coarse breath sounds bilaterally, trace wheezing. Oxygen saturation 97%, no tachypnea, transmitted upper airway noises as well.  Abdomen: Normoactive bowel sounds. No tenderness to deep or light palpation. No rebound or guarding.  Extremities:RLE > LLE edema Nails of bilateral LE mildly thickened, flaking present  Repetitive movement of tongue throughout exam   Felesha was seen today for follow-up.  Diagnoses and all orders for this visit:  Essential hypertension, at goal, has seen Nephrology.  -     Basic Metabolic Panel  Chronic obstructive pulmonary disease, unspecified COPD type ([place]), not confirmed by PFT, restrictive findings, may benefit from Pulmonary consult in future.  -     albuterol (PROVENTIL HFA;VENTOLIN HFA) 108 (90 Base) MCG/ACT inhaler; Inhale 2 puffs into the lungs every 6 (six) hours as needed for wheezing or shortness of breath. -     tiotropium (SPIRIVA HANDIHALER) 18 MCG inhalation capsule; Place 1 capsule (18 mcg total) into inhaler and inhale daily. -     guaiFENesin (ROBITUSSIN) 100 MG/5ML SOLN; Take 5 mLs (100 mg total) by mouth every 4 (four) hours as needed for cough or to loosen phlegm.  Onychomycosis -     Ambulatory referral to Podiatry  Subclinical hypothyroidism -     TSH  Sick sinus syndrome [facility]), has follow up with Cardiology.   Malodorous Urine discussed specific signs and symptoms of urinary tract infections.  We discussed the foul-smelling odor, cloudy appearance and change in color  not indications or symptoms of a urinary tract infection we discussed this at length.  Discussed the long-term consequences of antibiotic you overuse.  We discussed that altered mental status is no longer considered a symptom of urinary tract infection either.  Reviewed specific symptoms of urinary tract infection including fever, suprapubic pain, burning with urination or increase in  urinary frequency.  Tobacco use disorder, recommended cessation   History of AUB, discussed episodes of documented postmenopausal bleeding, recommended evaluation with Gynecology, patient declined. Discussed risk for malignancy with PMB.   Colon Cancer Screening, declined today.  [name], MD  Family Medicine Teaching Service

## 2018-01-25 ENCOUNTER — Telehealth: Payer: Self-pay | Admitting: Family Medicine

## 2018-01-25 LAB — BASIC METABOLIC PANEL
BUN/Creatinine Ratio: 23 (ref 12–28)
BUN: 44 mg/dL — ABNORMAL HIGH (ref 8–27)
CHLORIDE: 99 mmol/L (ref 96–106)
CO2: 15 mmol/L — ABNORMAL LOW (ref 20–29)
CREATININE: 1.88 mg/dL — AB (ref 0.57–1.00)
Calcium: 9.9 mg/dL (ref 8.7–10.3)
GFR calc Af Amer: 30 mL/min/{1.73_m2} — ABNORMAL LOW (ref >59)
GFR calc non Af Amer: 26 mL/min/{1.73_m2} — ABNORMAL LOW (ref >59)
GLUCOSE: 290 mg/dL — AB (ref 65–99)
Potassium: 5 mmol/L (ref 3.5–5.2)
SODIUM: 135 mmol/L (ref 134–144)

## 2018-01-25 LAB — TSH: TSH: 4.62 u[IU]/mL — AB (ref 0.450–4.500)

## 2018-01-25 NOTE — Telephone Encounter (Signed)
Attempted to call patient and son (documentation signed that [facility] can communicate with son as well). Unable to reach patient.  Nursing- please call patient and son. I would like to talk to them about the blood work. Importantly, if you speak with patient (or son who manages all medications) please ask patient stop HCTZ (hydrochlorthiazide). Kidney function is a bit elevated, we need to repeat blood. Ideally, this blood work would be repeat early this week (Monday/Tuesday). Glucose is also elevated--- I would like to talk to son/patient about this and further evaluation.   If amenable, please schedule with me in 1130 slot on Friday or next Monday (they need notice to get a ride to [facility]). She will need repeat blood work. Does not have to be fasting.  Hyperglycemia-- glucose 290, previously 185. Last A1C in 2015 was normal. Likely needs to start insulin, will discuss with patient and son. May also consider 2.5-5 mg of glipizide with dietary changes. Would benefit from nutrition consult.    AGMA- bicarbonate low for past year. Likely multifactorial---dehydration, hyperglycemia, CKD. Patient felt well yesterday, no nausea, vomiting, or symptoms of hyperglycemia at visit. Would like to discuss further--if symptomatic on telephone,  Will need  ED evaluation.   CKD, progressing. Stop HCTZ as at GFR cut off for efficacy.

## 2018-01-27 ENCOUNTER — Other Ambulatory Visit: Payer: Self-pay | Admitting: Family Medicine

## 2018-01-27 DIAGNOSIS — Z993 Dependence on wheelchair: Secondary | ICD-10-CM

## 2018-01-27 DIAGNOSIS — R739 Hyperglycemia, unspecified: Secondary | ICD-10-CM

## 2018-01-27 DIAGNOSIS — E038 Other specified hypothyroidism: Secondary | ICD-10-CM

## 2018-01-27 DIAGNOSIS — R531 Weakness: Secondary | ICD-10-CM

## 2018-01-27 DIAGNOSIS — E039 Hypothyroidism, unspecified: Secondary | ICD-10-CM

## 2018-01-27 DIAGNOSIS — Z86010 Personal history of colonic polyps: Secondary | ICD-10-CM

## 2018-01-27 NOTE — Progress Notes (Signed)
Called son---see documentation at 1454.

## 2018-01-27 NOTE — Addendum Note (Signed)
Addended byOwens Shark, Geral Coker on: 01/27/2018 12:08 PM   Modules accepted: Orders

## 2018-01-27 NOTE — Telephone Encounter (Signed)
Pt called just to return Dr. Saul Fordyce phone call. Please call her back. ad

## 2018-01-27 NOTE — Telephone Encounter (Signed)
Called patient and called son.  Discussed hyperglycemia, worsening creatinine on most recent metabolic panel.  TSH is also mildly elevated.  Patient requires repeat labs this week. Recommended being seen today or tomorrow for laboratory evaluation and repeat testing.  Patient does not want to leave house today.  She reports she feels well and would be willing to leave the house sometime next week.  Her son reports that they can get a ride early next week.  Recommended using a taxi or [place] Popper to ride to clinic. Patient does not wish to do this. She is amenable to a home health evaluation for PT (uses walker) and blood draw (by nurse).   - Home health referral placed for PT (wheelchair bound, unsteady transfers) and blood work (likely new diabetes) - [facility] referral placed - Discussed at length with patient and son. Discussed short and long term complications of worsening kidney function, hyperglycemia, and also acidosis. Patient would like to wait until next week to be seen in clinic for labs.  - Reviewed strict ED precautions. - At end of conversation, patient and son amenable to HHPT/nursing evaluation this week. They will try to arrange transportation if possible for appointment next week. Anxiety and wheelchair status limit ability to use public transit/uber/taxi.   All questions were answered, will discuss [facility] referral with clinic staff.   [name], MD  Family Medicine Teaching Service

## 2018-01-28 ENCOUNTER — Encounter: Payer: Self-pay | Admitting: Family Medicine

## 2018-01-28 DIAGNOSIS — I480 Paroxysmal atrial fibrillation: Secondary | ICD-10-CM | POA: Insufficient documentation

## 2018-01-28 LAB — CUP PACEART REMOTE DEVICE CHECK
Battery Impedance: 181 Ohm
Battery Remaining Longevity: 114 mo
Battery Voltage: 2.79 V
Brady Statistic AP VP Percent: 55 %
Brady Statistic AP VS Percent: 0 %
Brady Statistic AS VP Percent: 45 %
Date Time Interrogation Session: 20191118191138
Implantable Lead Implant Date: 20070414
Implantable Lead Implant Date: 20070914
Implantable Lead Location: 753859
Implantable Lead Location: 753860
Implantable Lead Model: 4092
Implantable Lead Model: 5594
Implantable Pulse Generator Implant Date: 20160816
Lead Channel Impedance Value: 532 Ohm
Lead Channel Pacing Threshold Amplitude: 0.625 V
Lead Channel Pacing Threshold Amplitude: 0.75 V
Lead Channel Pacing Threshold Pulse Width: 0.4 ms
Lead Channel Pacing Threshold Pulse Width: 0.4 ms
Lead Channel Setting Pacing Amplitude: 2 V
Lead Channel Setting Pacing Amplitude: 2.5 V
Lead Channel Setting Pacing Pulse Width: 0.4 ms
Lead Channel Setting Sensing Sensitivity: 4 mV
MDC IDC MSMT LEADCHNL RV IMPEDANCE VALUE: 644 Ohm
MDC IDC STAT BRADY AS VS PERCENT: 0 %

## 2018-01-29 ENCOUNTER — Telehealth: Payer: Self-pay | Admitting: Cardiovascular Disease

## 2018-01-29 NOTE — Telephone Encounter (Signed)
LM at [facility] (Dr. Arletha Grippe office) requesting call back. Dr. Sallyanne Kuster has openings on 02/05/18 but patient cannot get to lab appt at [facility] and appt at [facility] office on the same day (per son) due to transportation. Next available appt with Dr. Sallyanne Kuster after 1/22 is in April per scheduler.  Update--  Spoke with [name]. He reports Dr. [name] ordered [facility] RN to draw labs.  Spoke with Janett Billow from [facility]--patient may still need lab appt as HH was ordered, but not yet setup. She recommended that [name] contact the [facility] to reschedule lab appt.  Called [name] back. He discussed with [facility] representative and they told him they could draw the labs. He will plan to reschedule the [facility] lab appointment if this doesn't work out. Advised him to call their office directly to reschedule or cancel and he verbalizes understanding.

## 2018-01-29 NOTE — Telephone Encounter (Signed)
New Message        Patient is calling today to check the status on her results from the pacer check. Pls call and advise.

## 2018-01-29 NOTE — Telephone Encounter (Signed)
Spoke with patient's son, [name] ([place]). He verbalizes frustration that patient was not called about atrial arrhythmia episode that occurred on 09/27/17. He reports that he and the patient were told about the episode at her f/u appointment with Dr. [name].   Explained current remotes review process and offered to review repeat transmission today as [name] is concerned patient has had more episodes. He is agreeable to this plan. He will send transmission this afternoon.

## 2018-01-29 NOTE — Telephone Encounter (Signed)
Transmission received and reviewed. No further atrial arrhythmias noted since 09/27/17.  Discussed with Dr. Sallyanne Kuster by phone--plan to see patient non-urgently in the office in the next month to discuss episode and possible [place]. Reached out to scheduler for assistance with appointment.  Spoke with patient's son to advise of recommendations. He verbalizes understanding and agreement with plan. He thanked me for my call and is aware I will call back with available appointment times. He requests an appointment time after 11:00am due to their transportation situation. They will not be available on 1/30 or 1/31 due to other appointments.

## 2018-01-31 ENCOUNTER — Telehealth: Payer: Self-pay

## 2018-01-31 DIAGNOSIS — E02 Subclinical iodine-deficiency hypothyroidism: Secondary | ICD-10-CM | POA: Diagnosis not present

## 2018-01-31 DIAGNOSIS — I130 Hypertensive heart and chronic kidney disease with heart failure and stage 1 through stage 4 chronic kidney disease, or unspecified chronic kidney disease: Secondary | ICD-10-CM | POA: Diagnosis not present

## 2018-01-31 DIAGNOSIS — I480 Paroxysmal atrial fibrillation: Secondary | ICD-10-CM | POA: Diagnosis not present

## 2018-01-31 DIAGNOSIS — B91 Sequelae of poliomyelitis: Secondary | ICD-10-CM | POA: Diagnosis not present

## 2018-01-31 DIAGNOSIS — I495 Sick sinus syndrome: Secondary | ICD-10-CM | POA: Diagnosis not present

## 2018-01-31 DIAGNOSIS — J449 Chronic obstructive pulmonary disease, unspecified: Secondary | ICD-10-CM | POA: Diagnosis not present

## 2018-01-31 DIAGNOSIS — M1711 Unilateral primary osteoarthritis, right knee: Secondary | ICD-10-CM | POA: Diagnosis not present

## 2018-01-31 DIAGNOSIS — E669 Obesity, unspecified: Secondary | ICD-10-CM | POA: Diagnosis not present

## 2018-01-31 DIAGNOSIS — N183 Chronic kidney disease, stage 3 (moderate): Secondary | ICD-10-CM | POA: Diagnosis not present

## 2018-01-31 DIAGNOSIS — Z950 Presence of cardiac pacemaker: Secondary | ICD-10-CM | POA: Diagnosis not present

## 2018-01-31 DIAGNOSIS — I2510 Atherosclerotic heart disease of native coronary artery without angina pectoris: Secondary | ICD-10-CM | POA: Diagnosis not present

## 2018-01-31 DIAGNOSIS — Z993 Dependence on wheelchair: Secondary | ICD-10-CM | POA: Diagnosis not present

## 2018-01-31 DIAGNOSIS — R739 Hyperglycemia, unspecified: Secondary | ICD-10-CM | POA: Diagnosis not present

## 2018-01-31 DIAGNOSIS — M6281 Muscle weakness (generalized): Secondary | ICD-10-CM | POA: Diagnosis not present

## 2018-01-31 DIAGNOSIS — F17200 Nicotine dependence, unspecified, uncomplicated: Secondary | ICD-10-CM | POA: Diagnosis not present

## 2018-01-31 DIAGNOSIS — I5032 Chronic diastolic (congestive) heart failure: Secondary | ICD-10-CM | POA: Diagnosis not present

## 2018-01-31 DIAGNOSIS — Z6841 Body Mass Index (BMI) 40.0 and over, adult: Secondary | ICD-10-CM | POA: Diagnosis not present

## 2018-01-31 DIAGNOSIS — F200 Paranoid schizophrenia: Secondary | ICD-10-CM | POA: Diagnosis not present

## 2018-01-31 DIAGNOSIS — M47819 Spondylosis without myelopathy or radiculopathy, site unspecified: Secondary | ICD-10-CM | POA: Diagnosis not present

## 2018-01-31 DIAGNOSIS — G2401 Drug induced subacute dyskinesia: Secondary | ICD-10-CM | POA: Diagnosis not present

## 2018-01-31 NOTE — Telephone Encounter (Signed)
Lottie, RN with [facility], called for verbal orders:  Nursing 1x/week x 1, 2x/week x 3  Call back is [phone].  Danley Danker, RN [facility] RN)

## 2018-01-31 NOTE — Telephone Encounter (Signed)
Called RN Lottie with orders.   [name], MD  Family Medicine Teaching Service

## 2018-02-03 DIAGNOSIS — B91 Sequelae of poliomyelitis: Secondary | ICD-10-CM | POA: Diagnosis not present

## 2018-02-03 DIAGNOSIS — M1711 Unilateral primary osteoarthritis, right knee: Secondary | ICD-10-CM | POA: Diagnosis not present

## 2018-02-03 DIAGNOSIS — F200 Paranoid schizophrenia: Secondary | ICD-10-CM | POA: Diagnosis not present

## 2018-02-03 DIAGNOSIS — I480 Paroxysmal atrial fibrillation: Secondary | ICD-10-CM | POA: Diagnosis not present

## 2018-02-03 DIAGNOSIS — M6281 Muscle weakness (generalized): Secondary | ICD-10-CM | POA: Diagnosis not present

## 2018-02-03 DIAGNOSIS — J449 Chronic obstructive pulmonary disease, unspecified: Secondary | ICD-10-CM | POA: Diagnosis not present

## 2018-02-04 ENCOUNTER — Telehealth: Payer: Self-pay

## 2018-02-04 NOTE — Telephone Encounter (Signed)
Liji, PT with [facility], called for verbal orders:  HH PT 2x week x 4 weeks; 1x/week x 1 week.  Call back is [phone], ok to leave msg.  Danley Danker, RN [facility] Clinic RN)

## 2018-02-04 NOTE — Telephone Encounter (Signed)
Called PT Liji, approved below.  [name], MD  Family Medicine Teaching Service

## 2018-02-05 ENCOUNTER — Other Ambulatory Visit: Payer: Medicare Other

## 2018-02-05 ENCOUNTER — Encounter: Payer: Self-pay | Admitting: Cardiovascular Disease

## 2018-02-05 ENCOUNTER — Ambulatory Visit (INDEPENDENT_AMBULATORY_CARE_PROVIDER_SITE_OTHER): Payer: Medicare Other | Admitting: Cardiovascular Disease

## 2018-02-05 VITALS — BP 134/70 | HR 76 | Ht 66.0 in | Wt 291.0 lb

## 2018-02-05 DIAGNOSIS — I5032 Chronic diastolic (congestive) heart failure: Secondary | ICD-10-CM

## 2018-02-05 DIAGNOSIS — I480 Paroxysmal atrial fibrillation: Secondary | ICD-10-CM | POA: Diagnosis not present

## 2018-02-05 DIAGNOSIS — R29898 Other symptoms and signs involving the musculoskeletal system: Secondary | ICD-10-CM | POA: Diagnosis not present

## 2018-02-05 DIAGNOSIS — I442 Atrioventricular block, complete: Secondary | ICD-10-CM | POA: Diagnosis not present

## 2018-02-05 DIAGNOSIS — Z950 Presence of cardiac pacemaker: Secondary | ICD-10-CM | POA: Diagnosis not present

## 2018-02-05 DIAGNOSIS — I495 Sick sinus syndrome: Secondary | ICD-10-CM | POA: Diagnosis not present

## 2018-02-05 DIAGNOSIS — I10 Essential (primary) hypertension: Secondary | ICD-10-CM | POA: Diagnosis not present

## 2018-02-05 DIAGNOSIS — E7800 Pure hypercholesterolemia, unspecified: Secondary | ICD-10-CM | POA: Diagnosis not present

## 2018-02-05 MED ORDER — APIXABAN 5 MG PO TABS: 5.0000 mg | ORAL_TABLET | Freq: Two times a day (BID) | ORAL | 11 refills | Status: DC

## 2018-02-05 NOTE — Progress Notes (Signed)
Patient ID: Abigail Wallace, female   DOB: October 03, 1945, 73 y.o.   MRN: [id number]    Cardiology Office Note    Date:  02/07/2018   ID:  [place], [place] 1945/03/09, MRN [id number]  PCP:  Martyn Malay, MD  Cardiologist:   Sanda Klein, MD   Chief Complaint  Patient presents with  . Atrial Fibrillation  . Pacemaker Check    History of Present Illness:  Abigail Wallace is a 73 y.o. female with complete heart block and sinus node dysfunction who presents to discuss newly diagnosed atrial fibrillation, detected on a remote pacemaker check. As always, her son Abigail Wallace accompanies her today.  The episode occurred in late September and lasted for approximately 26 hours.  It was completely asymptomatic, which is not surprising since she has complete heart block.  Repeat interrogation of the device today shows there have been no new episodes of atrial fibrillation since then.  Before, the device has recorded rare episodes of nonsustained ventricular tachycardia (3 episodes in the last 6 months with a maximum duration of 5 seconds).  As expected she has 100% ventricular pacing, also has 50% atrial pacing with appropriate heart rate histogram distribution.  Her current generator was implanted in 2016 and has approximately 9.5 years of remaining expected longevity.  Her pacemaker is a dual-chamber Medtronic Adapta device implanted in August 2016 as a generator change out. She is pacemaker dependent due to complete heart block (she has an idioventricular escape rhythm around 30 bpm). She also has significant sinus bradycardia.   She initially received a dual-chamber permanent pacemaker in 2007 for symptomatic AV block. She has minimal coronary atherosclerosis by cardiac catheterization performed in 2007 and no evidence of insufficiency by nuclear perfusion testing in 2012. By echocardiography she has normal left ventricular size and systolic function and no major structural cardiac abnormalities.    Noncardiac history includes chronic back pain, obesity and orthostatic hypotension. She smokes roughly 5 cigarettes a day and has been diagnosed with COPD, but I don't think this has been formally documented. There is also a mention of a diagnosis of obstructive sleep apnea in her chart. Use CPAP. She has a history of schizophrenia with psychosis requiring hospitalization in 2012. Since then, she has done well and lives independently. Her son, Abigail Wallace ,accompanies her to most visits.   Past Medical History:  Diagnosis Date  . COPD (chronic obstructive pulmonary disease) ([place])   . Coronary artery disease    NON-CRITICAL  . GERD (gastroesophageal reflux disease)   . Gout   . Hypertension   . Memory difficulties 12/02/2013  . Mental disorder   . Psychosis ([place])   . Tardive dyskinesia 12/02/2013    Past Surgical History:  Procedure Laterality Date  . CARDIAC CATHETERIZATION  2007   Non-critical.   . CHOLECYSTECTOMY    . EP IMPLANTABLE DEVICE N/A 08/31/2014   Procedure: PPM Generator Changeout;  Surgeon: Sanda Klein, MD;  Location: [facility];  Service: Cardiovascular;  Laterality: N/A;  . INSERT / REPLACE / REMOVE PACEMAKER  2007   [place]/SYMPTOMATIC HEART BLOCK  . PACEMAKER PLACEMENT  09/28/2005   2/2 SSS?  . Persantine stress test  05/01/2010   EF 66%. Normal LV sys fx. Unchanged from previous studies.   . TRANSTHORACIC ECHOCARDIOGRAM  09/08/10   SEVERE CONCENTRIC HYPERTROPHY.LV FUNCTION WAS VIGOROUS.EF 65%-70%.VENTRICULAR SEPTUM-INCOORDINATE MOTION.LEFT ATRIUM-MILDLY DILATED.TRIVIAL TR.  . TUBAL LIGATION      Outpatient Medications Prior to Visit  Medication Sig  Dispense Refill  . acetaminophen (TYLENOL) 500 MG tablet Take 1,000 mg by mouth at bedtime.     Marland Kitchen albuterol (PROVENTIL HFA;VENTOLIN HFA) 108 (90 Base) MCG/ACT inhaler Inhale 2 puffs into the lungs every 6 (six) hours as needed for wheezing or shortness of breath. 1 Inhaler 2  . allopurinol (ZYLOPRIM) 100 MG  tablet TAKE 1 TABLET (100 MG TOTAL) BY MOUTH DAILY. 30 tablet 7  . amLODipine (NORVASC) 10 MG tablet Take 1 tablet (10 mg total) by mouth daily. 90 tablet 3  . diclofenac sodium (VOLTAREN) 1 % GEL Apply 4 g topically 4 (four) times daily as needed (joint pain). 200 g 2  . guaiFENesin (ROBITUSSIN) 100 MG/5ML SOLN Take 5 mLs (100 mg total) by mouth every 4 (four) hours as needed for cough or to loosen phlegm. 236 mL 0  . Incontinence Supplies MISC 1 Units by Does not apply route as needed. 100 each prn  . losartan (COZAAR) 100 MG tablet TAKE 1 TABLET BY MOUTH DAILY 90 tablet 2  . metoprolol tartrate (LOPRESSOR) 50 MG tablet TAKE 1 TABLET BY MOUTH TWICE A DAY 60 tablet 7  . nitroGLYCERIN (NITROSTAT) 0.4 MG SL tablet Place 1 tablet (0.4 mg total) under the tongue every 5 (five) minutes x 3 doses as needed. For chest 25 tablet 1  . nystatin cream (MYCOSTATIN) Apply 1 application topically 2 (two) times daily. 30 g 3  . OLANZapine (ZYPREXA) 10 MG tablet Take 10 mg by mouth at bedtime.  0  . pravastatin (PRAVACHOL) 40 MG tablet TAKE 1 TABLET BY MOUTH DAILY IN THE EVENING 30 tablet 6  . QUEtiapine (SEROQUEL) 25 MG tablet Take 25 mg by mouth at bedtime.   0  . umeclidinium bromide (INCRUSE ELLIPTA) 62.5 MCG/INH AEPB Inhale 1 puff into the lungs daily. 30 each 3  . baclofen (LIORESAL) 10 MG tablet TAKE ONE TABLET BY MOUTH EVERY 12 HOURS AS NEEDED FOR MUSCLE SPASMS 30 tablet 0  . Baclofen 5 MG TABS Take 1 tablet by mouth every 12 (twelve) hours as needed. 30 tablet 0  . diclofenac sodium (VOLTAREN) 1 % GEL APPLY 4 GRAMS FOUR TIMES A DAY AS NEEDED TOPICALLY FOR JOINT PAIN 100 g 0  . divalproex (DEPAKOTE ER) 250 MG 24 hr tablet Take 250 mg by mouth at bedtime.     Marland Kitchen tiotropium (SPIRIVA HANDIHALER) 18 MCG inhalation capsule Place 1 capsule (18 mcg total) into inhaler and inhale daily. 30 capsule 12   No facility-administered medications prior to visit.      Allergies:   Abilify [aripiprazole]; Clozapine;  Benadryl [diphenhydramine hcl]; Cogentin [benztropine]; Haloperidol lactate; Latuda [lurasidone hcl]; Remeron [mirtazapine]; Codeine; and Latex   Social History   Socioeconomic History  . Marital status: Divorced    Spouse name: Not on file  . Number of children: Not on file  . Years of education: Not on file  . Highest education level: Not on file  Occupational History  . Not on file  Social Needs  . Financial resource strain: Not on file  . Food insecurity:    Worry: Not on file    Inability: Not on file  . Transportation needs:    Medical: Not on file    Non-medical: Not on file  Tobacco Use  . Smoking status: Current Some Day Smoker    Packs/day: 0.25    Types: Cigarettes  . Smokeless tobacco: Never Used  . Tobacco comment: 6 cigs a day  Substance and Sexual Activity  .  Alcohol use: No    Alcohol/week: 0.0 standard drinks  . Drug use: No  . Sexual activity: Not on file  Lifestyle  . Physical activity:    Days per week: Not on file    Minutes per session: Not on file  . Stress: Not on file  Relationships  . Social connections:    Talks on phone: Not on file    Gets together: Not on file    Attends religious service: Not on file    Active member of club or organization: Not on file    Attends meetings of clubs or organizations: Not on file    Relationship status: Not on file  Other Topics Concern  . Not on file  Social History Narrative   Lives alone. Older sister lives nearby and helps take her to appointments and to take care of her; sister uses wheelchair, unsure why (thin and appears healthy).    Married.   Children: 2 daughters, 1 son; 8 GC, 5 GGC.    Occupation: disabled.    Caffeine:   Tobacco: 5-6/day   Denies alcohol.      Family History:  The patient's family history includes Diabetes type II in her son; Heart disease in her father and mother; Kidney disease in her father and mother; Stroke in her sister.   ROS:   Please see the history of  present illness.    Review of Systems  Constitution: Negative for chills, decreased appetite and fever.  HENT: Negative for nosebleeds.   Eyes: Negative for visual disturbance.  Cardiovascular: Positive for leg swelling. Negative for chest pain, irregular heartbeat, orthopnea, palpitations and syncope.  Respiratory: Positive for snoring. Negative for cough, hemoptysis and wheezing.   Endocrine: Negative for cold intolerance, heat intolerance, polydipsia, polyphagia and polyuria.  Hematologic/Lymphatic: Does not bruise/bleed easily.  Skin: Negative for itching and rash.  Musculoskeletal: Positive for arthritis, back pain, joint pain, muscle weakness and neck pain.  Gastrointestinal: Negative for abdominal pain, change in bowel habit, hematemesis, melena, nausea and vomiting.  Genitourinary: Negative for frequency, hematuria, non-menstrual bleeding and urgency.  Neurological: Positive for difficulty with concentration and tremors. Negative for headaches.  Psychiatric/Behavioral: Negative for depression and hallucinations.  Allergic/Immunologic: Negative.    All other systems reviewed and are negative.   PHYSICAL EXAM:   VS:  BP 134/70   Pulse 76   Ht 5\' 6"  (1.676 m)   Wt 291 lb (132 kg)   BMI 46.97 kg/m       General: Alert, oriented x3, no distress, morbidly obese healthy left subclavian pacemaker site Head: no evidence of trauma, PERRL, EOMI, no exophtalmos or lid lag, no myxedema, no xanthelasma; normal ears, nose and oropharynx Neck: normal jugular venous pulsations and no hepatojugular reflux; brisk carotid pulses without delay and no carotid bruits Chest: clear to auscultation, no signs of consolidation by percussion or palpation, normal fremitus, symmetrical and full respiratory excursions Cardiovascular: normal position and quality of the apical impulse, regular rhythm, normal first and paradoxically split second heart sounds, no murmurs, rubs or gallops Abdomen: no  tenderness or distention, no masses by palpation, no abnormal pulsatility or arterial bruits, normal bowel sounds, no hepatosplenomegaly Extremities: no clubbing, cyanosis or edema; 2+ radial, ulnar and brachial pulses bilaterally; 2+ right femoral, posterior tibial and dorsalis pedis pulses; 2+ left femoral, posterior tibial and dorsalis pedis pulses; no subclavian or femoral bruits Neurological: grossly nonfocal Psych: Normal mood and affect   Wt Readings from Last 3 Encounters:  02/05/18  291 lb (132 kg)  01/24/18 289 lb (131.1 kg)  07/12/17 285 lb 9.6 oz (129.5 kg)      Studies/Labs Reviewed:   EKG:  EKG is ordered today.  Shows atrial sensed, ventricular paced rhythm with a slightly prolonged AV delay of 222 ms.  Broad paced QRS 142 ms, QTC 481 ms Recent Labs: 01/24/2018: BUN 44; Creatinine, Ser 1.88; Potassium 5.0; Sodium 135; TSH 4.620  Lipid Panel     Component Value Date/Time   CHOL 122 11/19/2010 0605   TRIG 73 11/19/2010 0605   HDL 45 11/19/2010 0605   CHOLHDL 2.7 11/19/2010 0605   VLDL 15 11/19/2010 0605   LDLCALC 62 11/19/2010 0605   LDLDIRECT 80 03/20/2012 1510     ASSESSMENT:    1. Paroxysmal atrial fibrillation (HCC)   2. Chronic diastolic heart failure ([place])   3. Essential hypertension   4. SSS (sick sinus syndrome) ([place])   5. Complete heart block ([place])   6. Pacemaker   7. Hypercholesterolemia   8. Muscular deconditioning   9. Morbid obesity ([place])      PLAN:  In order of problems listed above:  1. Afib: Asymptomatic.  Only one episode recorded but was very long, over a day in duration.  Discussed the stroke risk with paroxysmal atrial fibrillation and the likelihood of arrhythmia recurrence over time.  She has no overt bleeding risks not had any recent bleeding complications, falls or injuries.  Her son would like her to delay initiation of anticoagulation until he returns from a one-week camping trip and that is perfectly reasonable.  Although she has  abnormal kidney function, her age and body habitus would still place her in the full 5 mg twice daily Eliquis dose range.  We will bring her back in a few months to assess for any bleeding complications.  Her son inquires whether some of her neurological issues might be signs of a previous stroke, but the tongue thrusting and lipsmacking that he described sounds much more like tardive dyskinesia related to her treatment with neuroleptic medications. 2. CHF: volume status is always difficult to assess due to her obesity, but she does not appear to be overtly hypervolemic today. 3. HTN: Fair control 4. SSS: Roughly 50% atrial pacing, heart rate distribution is appropriate for her sedentary lifestyle 5. CHB: She has a very slow idioventricular escape rhythm which has not always been present when tested. She should be considered pacemaker dependent. 6. PPM: Normal pacemaker function. Care[facility] downloads every 3 months and office visit in 6 months. 7. HLP: On pravastatin. 8. Deconditioning: Severe deconditioning.  Very reluctant to move around. 9. Obesity: Discussed the need for some physical activity and reduction in intake of carbohydrates and calories.  She is never been particularly receptive to this.   Medication Adjustments/Labs and Tests Ordered: Current medicines are reviewed at length with the patient today.  Concerns regarding medicines are outlined above.  Medication changes, Labs and Tests ordered today are listed in the Patient Instructions below. Patient Instructions  Medication Instructions:  Your physician has recommended you make the following change in your medication:   START Eliquis 5mg  Twice daily. An Rx has been sent to your pharmacy.  If you need a refill on your cardiac medications before your next appointment, please call your pharmacy.   Lab work: None ordered If you have labs (blood work) drawn today and your tests are completely normal, you will receive your results only  by: Marland Kitchen MyChart Message (if you have  MyChart) OR . A paper copy in the mail If you have any lab test that is abnormal or we need to change your treatment, we will call you to review the results.  Testing/Procedures: None ordered  Follow-Up: At [facility], you and your health needs are our priority.  As part of our continuing mission to provide you with exceptional heart care, we have created designated Provider Care Teams.  These Care Teams include your primary Cardiologist (physician) and Advanced Practice Providers (APPs -  Physician Assistants and Nurse Practitioners) who all work together to provide you with the care you need, when you need it. You will need a follow up appointment in 6 months.  Please call our office 2 months in advance to schedule this appointment.  You may see  Dr.Tru Rana or one of the following Advanced Practice Providers on your designated Care Team: Almyra Deforest, [place] . Fabian Sharp, PA-C  Remote monitoring is used to monitor your Pacemaker of ICD from home. This monitoring reduces the number of office visits required to check your device to one time per year. It allows [place] to keep an eye on the functioning of your device to ensure it is working properly. You are scheduled for a device check from home on 03/03/18. You may send your transmission at any time that day. If you have a wireless device, the transmission will be sent automatically. After your physician reviews your transmission, you will receive a postcard with your next transmission date.           Signed, Sanda Klein, MD  02/07/2018 7:56 AM    [facility] [address], [place],   [address] Phone: [phone]; Fax: [phone]

## 2018-02-05 NOTE — Patient Instructions (Signed)
Medication Instructions:  Your physician has recommended you make the following change in your medication:   START Eliquis 5mg  Twice daily. An Rx has been sent to your pharmacy.  If you need a refill on your cardiac medications before your next appointment, please call your pharmacy.   Lab work: None ordered If you have labs (blood work) drawn today and your tests are completely normal, you will receive your results only by: Marland Kitchen MyChart Message (if you have MyChart) OR . A paper copy in the mail If you have any lab test that is abnormal or we need to change your treatment, we will call you to review the results.  Testing/Procedures: None ordered  Follow-Up: At [facility], you and your health needs are our priority.  As part of our continuing mission to provide you with exceptional heart care, we have created designated Provider Care Teams.  These Care Teams include your primary Cardiologist (physician) and Advanced Practice Providers (APPs -  Physician Assistants and Nurse Practitioners) who all work together to provide you with the care you need, when you need it. You will need a follow up appointment in 6 months.  Please call our office 2 months in advance to schedule this appointment.  You may see  Dr.Croitoru or one of the following Advanced Practice Providers on your designated Care Team: Almyra Deforest, [place] . Fabian Sharp, PA-C  Remote monitoring is used to monitor your Pacemaker of ICD from home. This monitoring reduces the number of office visits required to check your device to one time per year. It allows [place] to keep an eye on the functioning of your device to ensure it is working properly. You are scheduled for a device check from home on 03/03/18. You may send your transmission at any time that day. If you have a wireless device, the transmission will be sent automatically. After your physician reviews your transmission, you will receive a postcard with your next transmission  date.

## 2018-02-06 ENCOUNTER — Telehealth: Payer: Self-pay

## 2018-02-06 NOTE — Telephone Encounter (Signed)
Webb Silversmith, RN with [facility], left message to inform PCP that son declined skilled nursing services. Did not feel they were needed.  Call back for questions: [phone]  Danley Danker, RN [facility])

## 2018-02-06 NOTE — Telephone Encounter (Signed)
Patient has not yet had labs repeated.  Nursing- please call son and remind him that labs need to be repeated. Patient was to come to lab for this at time of Cardiology visit (they are often on a schedule due to transportation needs and have difficulty with transport).  The labs are ordered. Let me know if patient/son have questions.  Thanks, [name], MD  [facility] Medicine Teaching Service

## 2018-02-07 ENCOUNTER — Encounter: Payer: Self-pay | Admitting: Cardiovascular Disease

## 2018-02-07 ENCOUNTER — Encounter: Payer: Self-pay | Admitting: Family Medicine

## 2018-02-07 ENCOUNTER — Telehealth: Payer: Self-pay

## 2018-02-07 DIAGNOSIS — F200 Paranoid schizophrenia: Secondary | ICD-10-CM | POA: Diagnosis not present

## 2018-02-07 DIAGNOSIS — B91 Sequelae of poliomyelitis: Secondary | ICD-10-CM | POA: Diagnosis not present

## 2018-02-07 DIAGNOSIS — M6281 Muscle weakness (generalized): Secondary | ICD-10-CM | POA: Diagnosis not present

## 2018-02-07 DIAGNOSIS — I480 Paroxysmal atrial fibrillation: Secondary | ICD-10-CM | POA: Diagnosis not present

## 2018-02-07 DIAGNOSIS — E7800 Pure hypercholesterolemia, unspecified: Secondary | ICD-10-CM | POA: Insufficient documentation

## 2018-02-07 DIAGNOSIS — M1711 Unilateral primary osteoarthritis, right knee: Secondary | ICD-10-CM | POA: Diagnosis not present

## 2018-02-07 DIAGNOSIS — J449 Chronic obstructive pulmonary disease, unspecified: Secondary | ICD-10-CM | POA: Diagnosis not present

## 2018-02-07 NOTE — Telephone Encounter (Signed)
Called several different numbers for home and both of the sons Jori Moll and  [name]). Attempting to make Lab visit appointment.  I was hung up on 1st time and then no answer at the other numbers.  I have discussed with PCP and Dr. [name] will send letter/  .Ozella Almond, CMA

## 2018-02-07 NOTE — Telephone Encounter (Signed)
Letter sent.

## 2018-02-11 DIAGNOSIS — I480 Paroxysmal atrial fibrillation: Secondary | ICD-10-CM | POA: Diagnosis not present

## 2018-02-11 DIAGNOSIS — F200 Paranoid schizophrenia: Secondary | ICD-10-CM | POA: Diagnosis not present

## 2018-02-11 DIAGNOSIS — M6281 Muscle weakness (generalized): Secondary | ICD-10-CM | POA: Diagnosis not present

## 2018-02-11 DIAGNOSIS — M1711 Unilateral primary osteoarthritis, right knee: Secondary | ICD-10-CM | POA: Diagnosis not present

## 2018-02-11 DIAGNOSIS — J449 Chronic obstructive pulmonary disease, unspecified: Secondary | ICD-10-CM | POA: Diagnosis not present

## 2018-02-11 DIAGNOSIS — B91 Sequelae of poliomyelitis: Secondary | ICD-10-CM | POA: Diagnosis not present

## 2018-02-12 ENCOUNTER — Other Ambulatory Visit: Payer: Self-pay

## 2018-02-12 ENCOUNTER — Encounter: Payer: Self-pay | Admitting: Family Medicine

## 2018-02-12 ENCOUNTER — Ambulatory Visit (INDEPENDENT_AMBULATORY_CARE_PROVIDER_SITE_OTHER): Payer: Medicare Other | Admitting: Family Medicine

## 2018-02-12 VITALS — BP 122/68 | HR 72 | Temp 98.6°F

## 2018-02-12 DIAGNOSIS — I5032 Chronic diastolic (congestive) heart failure: Secondary | ICD-10-CM | POA: Diagnosis not present

## 2018-02-12 DIAGNOSIS — N183 Chronic kidney disease, stage 3 unspecified: Secondary | ICD-10-CM

## 2018-02-12 DIAGNOSIS — E038 Other specified hypothyroidism: Secondary | ICD-10-CM

## 2018-02-12 DIAGNOSIS — N1832 Chronic kidney disease, stage 3b: Secondary | ICD-10-CM

## 2018-02-12 DIAGNOSIS — I10 Essential (primary) hypertension: Secondary | ICD-10-CM

## 2018-02-12 DIAGNOSIS — E1165 Type 2 diabetes mellitus with hyperglycemia: Secondary | ICD-10-CM | POA: Diagnosis not present

## 2018-02-12 DIAGNOSIS — E039 Hypothyroidism, unspecified: Secondary | ICD-10-CM | POA: Diagnosis not present

## 2018-02-12 DIAGNOSIS — J449 Chronic obstructive pulmonary disease, unspecified: Secondary | ICD-10-CM | POA: Diagnosis not present

## 2018-02-12 DIAGNOSIS — I2510 Atherosclerotic heart disease of native coronary artery without angina pectoris: Secondary | ICD-10-CM | POA: Diagnosis not present

## 2018-02-12 DIAGNOSIS — R739 Hyperglycemia, unspecified: Secondary | ICD-10-CM

## 2018-02-12 DIAGNOSIS — N289 Disorder of kidney and ureter, unspecified: Secondary | ICD-10-CM

## 2018-02-12 LAB — POCT GLYCOSYLATED HEMOGLOBIN (HGB A1C): HbA1c, POC (controlled diabetic range): 10.5 % — AB (ref 0.0–7.0)

## 2018-02-12 LAB — CUP PACEART INCLINIC DEVICE CHECK
Date Time Interrogation Session: 20200129152900
Implantable Lead Implant Date: 20070414
Implantable Lead Implant Date: 20070914
Implantable Lead Location: 753859
Implantable Lead Location: 753860
Implantable Lead Model: 4092
Implantable Lead Model: 5594
Implantable Pulse Generator Implant Date: 20160816

## 2018-02-12 MED ORDER — LINAGLIPTIN 5 MG PO TABS: 5.0000 mg | ORAL_TABLET | Freq: Every day | ORAL | 3 refills | Status: DC

## 2018-02-12 MED ORDER — LANCET DEVICE MISC: 1.0000 | 11 refills | Status: DC

## 2018-02-12 MED ORDER — ONETOUCH VERIO W/DEVICE KIT: 1.0000 | PACK | 1 refills | Status: DC

## 2018-02-12 MED ORDER — GLUCOSE BLOOD VI STRP: ORAL_STRIP | 12 refills | Status: DC

## 2018-02-12 MED ORDER — ALBUTEROL SULFATE (2.5 MG/3ML) 0.083% IN NEBU: 2.5000 mg | INHALATION_SOLUTION | Freq: Four times a day (QID) | RESPIRATORY_TRACT | 1 refills | Status: DC | PRN

## 2018-02-12 MED ORDER — GLIPIZIDE 5 MG PO TABS: 5.0000 mg | ORAL_TABLET | Freq: Every day | ORAL | 3 refills | Status: DC

## 2018-02-12 NOTE — Progress Notes (Signed)
Patient Name: Abigail Wallace Date of Birth: 1945-11-01 Date of Visit: 02/12/18 PCP: Abigail Malay, MD  Chief Complaint:  Discuss blood work, urinary frequency   Subjective: Abigail Wallace is a pleasant 73 year old woman with history of atrial fibrillation, tobacco abuse, COPD, chronic kidney disease, and schizophrenia presenting for urinary frequency. She is joined by her son Abigail Wallace). A portion of the interview was obtained alone.   COPD Feels improved with Incruse Ellipta use and albuterol. She is still smoking. Apartment complex is eliminating smoking March 1st. She would like to quit. Previously quit X 7 years, started back a year ago. Smokes 1/4 PPD. She has some difficulty using inhalers. Son is helping with this. No chest pain.   Atrial fibrillation Abigail Wallace and patient have questions about which foods patient can eat. Read online she should avoid greens, has been avoiding since. She is on Apixaban. Denies melena, hematochezia, falls.   Type 2 Diabete A1C today is 10.6. Blood glucose levels have been elevated for a few years, last A1C 4 years ago within normal limits. She has gained 110 pounds in 7 years (was 182 in 2012. She endorses polyuria. No polydipsia. Patient and son are fearful of needles. She drinks 32-64 ounces of regular soda, ginger ale, juice (orange), and gatorade. She has eliminated greens.   Social The patient lives along in a 1 story apartment. Son, Abigail Wallace, arrives in home at 7 AM, gives medications and spends most of day with patient. Patient interviewed alone. Reports she feels safe with Abigail Wallace. Denies any physical, sexual, verbal abuse specifically.    ROS:  ROS Per HPI .   I have reviewed the patient's medical, surgical, family, and social history as appropriate.   Vitals:   02/12/18 1158  BP: 122/68  Pulse: 72  Temp: 98.6 F (37 C)  SpO2: 97%  HEENT: Sclera anicteric. Dentition is poor. Appears well hydrated. Neck: Supple no LAD  Cardiac: Regular rate and  rhythm. Normal S1/S2. No murmurs, rubs, or gallops appreciated. Lungs: Coarse bilateral breath sounds  Abdomen: Obese, no tenderness  Extremities: Warm, well perfused w1-2 + pedal edema, pitting, stable  Psych: Pleasant and appropriate, repetitive movement of mouth and  Digits 1/2 on BL UE   Abigail Wallace was seen today for follow-up.  Diagnoses and all orders for this visit:  Type 2 diabetes mellitus with hyperglycemia, without long-term current use of insulin ([place]), elevated BG level on BMET for last year, HbA1C 10.6, recommend insulin. Discussed diagnosis, prognosis with patient and son. Discussed recommend insulin therapy (lantus 10 units in the AM). Will start with oral medications and BG checks three times weekly at home given patient and son's hesitancy to use needles. Patient will require significant education around insulin use, BG monitoring. Recommended reducing/eliminating sugar sweetened beverages.  -     glipiZIDE (GLUCOTROL) 5 MG tablet; Take 1 tablet (5 mg total) by mouth daily before breakfast. -     linagliptin (TRADJENTA) 5 MG TABS tablet; Take 1 tablet (5 mg total) by mouth daily. -     Blood Glucose Monitoring Suppl (ONETOUCH VERIO) w/Device KIT; 1 Device by Does not apply route 3 (three) times a week. -     glucose blood test strip; Test three time weekly -     Lancet Device MISC; 1 Device by Does not apply route 3 (three) times a week. -     HgB A1c  Subclinical hypothyroidism -     TSH -  T4, Free -     T3, Free  Essential hypertension -     Basic Metabolic Panel  COPD without exacerbation (HCC) -     albuterol (PROVENTIL) (2.5 MG/3ML) 0.083% nebulizer solution; Take 3 mLs (2.5 mg total) by nebulization every 6 (six) hours as needed for wheezing or shortness of breath. - Consider repeat PFT at follow up. Declined influenza vaccines.   Chronic kidney disease, stage III (moderate) (HCC)consider reducing dose of losartan pending repeat BMET.   Lesion of left native  kidney  will discuss with patient and son at follow up, identified on CT in January 2019, needs MRI follow up.   Tobacco cessation discussed and congratulated on her motivation. Quit date in march, patches at follow up.    HCM  Declined influenza vaccine  Patient and son would like to focus on quality of life rather than quantity of life---- will continue to address colorectal cancer screening at followup.   Abigail Singh, MD  Family Medicine Teaching Service

## 2018-02-12 NOTE — Patient Instructions (Addendum)
It was wonderful to see you today.  Thank you for choosing [facility].   Please call [phone] with any questions about today's appointment.  Please be sure to schedule follow up at the front  desk before you leave today.   [name], MD  Family Medicine    Check your blood sugars in the morning 3 times a week on Monday Wednesday and Friday.  Please give me a call if your sugar is less than 90 or unreadable on the machine.  Start glipizide once daily in the morning.  Make sure you eat during the day with this  Start the new medication Tradjenta once in the morning.  Sometimes this can cause some nausea.  It usually resolves over time.   [facility] should be contacting you about a nebulizer I will be in touch about this   You already have a follow-up appointment scheduled with me  Reduce the amount of sugar sweetened beverages you drink.  Your goal is to drink only water throughout the day that he have a sugar sweetened beverage like Gatorade, juice, sweet tea, soda this should be 8 ounces or less.  This is 1 cup  We will talk about more quitting smoking at your next visit--- I am so glad you want to quit smoking       Mediterranean Diet  Why follow it? Research shows. . Those who follow the Mediterranean diet have a reduced risk of heart disease  . The diet is associated with a reduced incidence of Parkinson's and Alzheimer's diseases . People following the diet may have longer life expectancies and lower rates of chronic diseases  . The Dietary Guidelines for Americans recommends the Mediterranean diet as an eating plan to promote health and prevent disease  What Is the Mediterranean Diet?  . Healthy eating plan based on typical foods and recipes of Mediterranean-style cooking . The diet is primarily a plant based diet; these foods should make up a majority of meals   Starches - Plant based foods should make up a majority of meals - They are  an important sources of vitamins, minerals, energy, antioxidants, and fiber - Choose whole grains, foods high in fiber and minimally processed items  - Typical grain sources include wheat, oats, barley, corn, Daisy Lites rice, bulgar, farro, millet, polenta, couscous  - Various types of beans include chickpeas, lentils, fava beans, black beans, white beans   Fruits  Veggies - Large quantities of antioxidant rich fruits & veggies; 6 or more servings  - Vegetables can be eaten raw or lightly drizzled with oil and cooked  - Vegetables common to the traditional Mediterranean Diet include: artichokes, arugula, beets, broccoli, brussel sprouts, cabbage, carrots, celery, collard greens, cucumbers, eggplant, kale, leeks, lemons, lettuce, mushrooms, okra, onions, peas, peppers, potatoes, pumpkin, radishes, rutabaga, shallots, spinach, sweet potatoes, turnips, zucchini - Fruits common to the Mediterranean Diet include: apples, apricots, avocados, cherries, clementines, dates, figs, grapefruits, grapes, melons, nectarines, oranges, peaches, pears, pomegranates, strawberries, tangerines  Fats - Replace butter and margarine with healthy oils, such as olive oil, canola oil, and tahini  - Limit nuts to no more than a handful a day  - Nuts include walnuts, almonds, pecans, pistachios, pine nuts  - Limit or avoid candied, honey roasted or heavily salted nuts - Olives are central to the Mediterranean diet - can be eaten whole or used in a variety of dishes   Meats Protein - Limiting red meat: no more than a  few times a month - When eating red meat: choose lean cuts and keep the portion to the size of deck of cards - Eggs: approx. 0 to 4 times a week  - Fish and lean poultry: at least 2 a week  - Healthy protein sources include, chicken, [place], lean beef, lamb - Increase intake of seafood such as tuna, salmon, trout, mackerel, shrimp, scallops - Avoid or limit high fat processed meats such as sausage and bacon  Dairy -  Include moderate amounts of low fat dairy products  - Focus on healthy dairy such as fat free yogurt, skim milk, low or reduced fat cheese - Limit dairy products higher in fat such as whole or 2% milk, cheese, ice cream  Alcohol - Moderate amounts of red wine is ok  - No more than 5 oz daily for women (all ages) and men older than age 13  - No more than 10 oz of wine daily for men younger than 62  Other - Limit sweets and other desserts  - Use herbs and spices instead of salt to flavor foods  - Herbs and spices common to the traditional Mediterranean Diet include: basil, bay leaves, chives, cloves, cumin, fennel, garlic, lavender, marjoram, mint, oregano, parsley, pepper, rosemary, sage, savory, sumac, tarragon, thyme   It's not just a diet, it's a lifestyle:  . The Mediterranean diet includes lifestyle factors typical of those in the region  . Foods, drinks and meals are best eaten with others and savored . Daily physical activity is important for overall good health . This could be strenuous exercise like running and aerobics . This could also be more leisurely activities such as walking, housework, yard-work, or taking the stairs . Moderation is the key; a balanced and healthy diet accommodates most foods and drinks . Consider portion sizes and frequency of consumption of certain foods   Meal Ideas & Options:  . Breakfast:  o Whole wheat toast or whole wheat English muffins with peanut butter & hard boiled egg o Steel cut oats topped with apples & cinnamon and skim milk  o Fresh fruit: banana, strawberries, melon, berries, peaches  o Smoothies: strawberries, bananas, greek yogurt, peanut butter o Low fat greek yogurt with blueberries and granola  o Egg white omelet with spinach and mushrooms o Breakfast couscous: whole wheat couscous, apricots, skim milk, cranberries  . Sandwiches:  o Hummus and grilled vegetables (peppers, zucchini, squash) on whole wheat bread   o Grilled chicken  on whole wheat pita with lettuce, tomatoes, cucumbers or tzatziki  o Tuna salad on whole wheat bread: tuna salad made with greek yogurt, olives, red peppers, capers, green onions o Garlic rosemary lamb pita: lamb sauted with garlic, rosemary, salt & pepper; add lettuce, cucumber, greek yogurt to pita - flavor with lemon juice and black pepper  . Seafood:  o Mediterranean grilled salmon, seasoned with garlic, basil, parsley, lemon juice and black pepper o Shrimp, lemon, and spinach whole-grain pasta salad made with low fat greek yogurt  o Seared scallops with lemon orzo  o Seared tuna steaks seasoned salt, pepper, coriander topped with tomato mixture of olives, tomatoes, olive oil, minced garlic, parsley, green onions and cappers  . Meats:  o Herbed greek chicken salad with kalamata olives, cucumber, feta  o Red bell peppers stuffed with spinach, bulgur, lean ground beef (or lentils) & topped with feta   o Kebabs: skewers of chicken, tomatoes, onions, zucchini, squash  o [place] burgers: made with red onions,  mint, dill, lemon juice, feta cheese topped with roasted red peppers . Vegetarian o Cucumber salad: cucumbers, artichoke hearts, celery, red onion, feta cheese, tossed in olive oil & lemon juice  o Hummus and whole grain pita points with a greek salad (lettuce, tomato, feta, olives, cucumbers, red onion) o Lentil soup with celery, carrots made with vegetable broth, garlic, salt and pepper  o Tabouli salad: parsley, bulgur, mint, scallions, cucumbers, tomato, radishes, lemon juice, olive oil, salt and pepper.

## 2018-02-13 ENCOUNTER — Inpatient Hospital Stay (HOSPITAL_COMMUNITY)
Admission: EM | Admit: 2018-02-13 | Discharge: 2018-02-16 | DRG: 683 | Disposition: A | Discharge status: Home Health | Payer: Medicare Other | Attending: Family Medicine | Admitting: Family Medicine

## 2018-02-13 ENCOUNTER — Other Ambulatory Visit: Payer: Self-pay

## 2018-02-13 ENCOUNTER — Telehealth: Payer: Self-pay | Admitting: Family Medicine

## 2018-02-13 ENCOUNTER — Emergency Department (HOSPITAL_COMMUNITY): Payer: Medicare Other

## 2018-02-13 ENCOUNTER — Encounter (HOSPITAL_COMMUNITY): Payer: Self-pay | Admitting: Emergency Medicine

## 2018-02-13 DIAGNOSIS — E860 Dehydration: Secondary | ICD-10-CM | POA: Diagnosis present

## 2018-02-13 DIAGNOSIS — F200 Paranoid schizophrenia: Secondary | ICD-10-CM | POA: Diagnosis present

## 2018-02-13 DIAGNOSIS — K219 Gastro-esophageal reflux disease without esophagitis: Secondary | ICD-10-CM | POA: Diagnosis present

## 2018-02-13 DIAGNOSIS — R0602 Shortness of breath: Secondary | ICD-10-CM | POA: Diagnosis not present

## 2018-02-13 DIAGNOSIS — F0390 Unspecified dementia without behavioral disturbance: Secondary | ICD-10-CM | POA: Diagnosis present

## 2018-02-13 DIAGNOSIS — I5032 Chronic diastolic (congestive) heart failure: Secondary | ICD-10-CM | POA: Diagnosis not present

## 2018-02-13 DIAGNOSIS — Z79899 Other long term (current) drug therapy: Secondary | ICD-10-CM

## 2018-02-13 DIAGNOSIS — E782 Mixed hyperlipidemia: Secondary | ICD-10-CM | POA: Diagnosis present

## 2018-02-13 DIAGNOSIS — M109 Gout, unspecified: Secondary | ICD-10-CM | POA: Diagnosis present

## 2018-02-13 DIAGNOSIS — Z841 Family history of disorders of kidney and ureter: Secondary | ICD-10-CM

## 2018-02-13 DIAGNOSIS — I442 Atrioventricular block, complete: Secondary | ICD-10-CM | POA: Diagnosis not present

## 2018-02-13 DIAGNOSIS — N179 Acute kidney failure, unspecified: Principal | ICD-10-CM | POA: Diagnosis present

## 2018-02-13 DIAGNOSIS — Z993 Dependence on wheelchair: Secondary | ICD-10-CM

## 2018-02-13 DIAGNOSIS — Z6841 Body Mass Index (BMI) 40.0 and over, adult: Secondary | ICD-10-CM

## 2018-02-13 DIAGNOSIS — Z833 Family history of diabetes mellitus: Secondary | ICD-10-CM

## 2018-02-13 DIAGNOSIS — D631 Anemia in chronic kidney disease: Secondary | ICD-10-CM | POA: Diagnosis present

## 2018-02-13 DIAGNOSIS — I2510 Atherosclerotic heart disease of native coronary artery without angina pectoris: Secondary | ICD-10-CM | POA: Diagnosis present

## 2018-02-13 DIAGNOSIS — J441 Chronic obstructive pulmonary disease with (acute) exacerbation: Secondary | ICD-10-CM | POA: Diagnosis present

## 2018-02-13 DIAGNOSIS — Z7901 Long term (current) use of anticoagulants: Secondary | ICD-10-CM

## 2018-02-13 DIAGNOSIS — N189 Chronic kidney disease, unspecified: Secondary | ICD-10-CM | POA: Diagnosis present

## 2018-02-13 DIAGNOSIS — Z885 Allergy status to narcotic agent status: Secondary | ICD-10-CM

## 2018-02-13 DIAGNOSIS — Z91040 Latex allergy status: Secondary | ICD-10-CM

## 2018-02-13 DIAGNOSIS — E872 Acidosis: Secondary | ICD-10-CM | POA: Diagnosis present

## 2018-02-13 DIAGNOSIS — I480 Paroxysmal atrial fibrillation: Secondary | ICD-10-CM | POA: Diagnosis present

## 2018-02-13 DIAGNOSIS — Z823 Family history of stroke: Secondary | ICD-10-CM

## 2018-02-13 DIAGNOSIS — N183 Chronic kidney disease, stage 3 (moderate): Secondary | ICD-10-CM | POA: Diagnosis present

## 2018-02-13 DIAGNOSIS — E1165 Type 2 diabetes mellitus with hyperglycemia: Secondary | ICD-10-CM | POA: Diagnosis present

## 2018-02-13 DIAGNOSIS — I130 Hypertensive heart and chronic kidney disease with heart failure and stage 1 through stage 4 chronic kidney disease, or unspecified chronic kidney disease: Secondary | ICD-10-CM | POA: Diagnosis not present

## 2018-02-13 DIAGNOSIS — R079 Chest pain, unspecified: Secondary | ICD-10-CM | POA: Diagnosis not present

## 2018-02-13 DIAGNOSIS — F17210 Nicotine dependence, cigarettes, uncomplicated: Secondary | ICD-10-CM | POA: Diagnosis present

## 2018-02-13 DIAGNOSIS — Z9049 Acquired absence of other specified parts of digestive tract: Secondary | ICD-10-CM

## 2018-02-13 DIAGNOSIS — Z7984 Long term (current) use of oral hypoglycemic drugs: Secondary | ICD-10-CM

## 2018-02-13 DIAGNOSIS — Z950 Presence of cardiac pacemaker: Secondary | ICD-10-CM

## 2018-02-13 DIAGNOSIS — Z888 Allergy status to other drugs, medicaments and biological substances status: Secondary | ICD-10-CM

## 2018-02-13 DIAGNOSIS — E1122 Type 2 diabetes mellitus with diabetic chronic kidney disease: Secondary | ICD-10-CM | POA: Diagnosis present

## 2018-02-13 DIAGNOSIS — Z8249 Family history of ischemic heart disease and other diseases of the circulatory system: Secondary | ICD-10-CM

## 2018-02-13 DIAGNOSIS — R944 Abnormal results of kidney function studies: Secondary | ICD-10-CM | POA: Diagnosis not present

## 2018-02-13 DIAGNOSIS — K529 Noninfective gastroenteritis and colitis, unspecified: Secondary | ICD-10-CM | POA: Diagnosis present

## 2018-02-13 LAB — CBC WITH DIFFERENTIAL/PLATELET
Abs Immature Granulocytes: 0.26 10*3/uL — ABNORMAL HIGH (ref 0.00–0.07)
Basophils Absolute: 0.1 10*3/uL (ref 0.0–0.1)
Basophils Relative: 1 %
Eosinophils Absolute: 0.3 10*3/uL (ref 0.0–0.5)
Eosinophils Relative: 2 %
HCT: 33.3 % — ABNORMAL LOW (ref 36.0–46.0)
Hemoglobin: 10.1 g/dL — ABNORMAL LOW (ref 12.0–15.0)
Immature Granulocytes: 2 %
Lymphocytes Relative: 28 %
Lymphs Abs: 3.4 10*3/uL (ref 0.7–4.0)
MCH: 27.2 pg (ref 26.0–34.0)
MCHC: 30.3 g/dL (ref 30.0–36.0)
MCV: 89.8 fL (ref 80.0–100.0)
Monocytes Absolute: 0.6 10*3/uL (ref 0.1–1.0)
Monocytes Relative: 5 %
NRBC: 0 % (ref 0.0–0.2)
Neutro Abs: 7.6 10*3/uL (ref 1.7–7.7)
Neutrophils Relative %: 62 %
Platelets: 271 10*3/uL (ref 150–400)
RBC: 3.71 MIL/uL — AB (ref 3.87–5.11)
RDW: 14.8 % (ref 11.5–15.5)
WBC: 12.2 10*3/uL — AB (ref 4.0–10.5)

## 2018-02-13 LAB — COMPREHENSIVE METABOLIC PANEL
ALT: 14 U/L (ref 0–44)
AST: 22 U/L (ref 15–41)
Albumin: 3.5 g/dL (ref 3.5–5.0)
Alkaline Phosphatase: 74 U/L (ref 38–126)
Anion gap: 12 (ref 5–15)
BUN: 43 mg/dL — ABNORMAL HIGH (ref 8–23)
CO2: 15 mmol/L — ABNORMAL LOW (ref 22–32)
Calcium: 9.4 mg/dL (ref 8.9–10.3)
Chloride: 108 mmol/L (ref 98–111)
Creatinine, Ser: 2.26 mg/dL — ABNORMAL HIGH (ref 0.44–1.00)
GFR calc Af Amer: 24 mL/min — ABNORMAL LOW (ref >60)
GFR calc non Af Amer: 21 mL/min — ABNORMAL LOW (ref >60)
Glucose, Bld: 290 mg/dL — ABNORMAL HIGH (ref 70–99)
Potassium: 4.3 mmol/L (ref 3.5–5.1)
Sodium: 135 mmol/L (ref 135–145)
Total Bilirubin: 0.5 mg/dL (ref 0.3–1.2)
Total Protein: 6.8 g/dL (ref 6.5–8.1)

## 2018-02-13 LAB — URINALYSIS, ROUTINE W REFLEX MICROSCOPIC
Bacteria, UA: NONE SEEN
Bilirubin Urine: NEGATIVE
Glucose, UA: >=500 mg/dL — AB
Ketones, ur: 5 mg/dL — AB
Leukocytes, UA: NEGATIVE
Nitrite: NEGATIVE
Protein, ur: 100 mg/dL — AB
Specific Gravity, Urine: 1.013 (ref 1.005–1.030)
pH: 5 (ref 5.0–8.0)

## 2018-02-13 LAB — BASIC METABOLIC PANEL
BUN/Creatinine Ratio: 21 (ref 12–28)
BUN: 49 mg/dL — ABNORMAL HIGH (ref 8–27)
CO2: 10 mmol/L — ABNORMAL LOW (ref 20–29)
Calcium: 9.6 mg/dL (ref 8.7–10.3)
Chloride: 103 mmol/L (ref 96–106)
Creatinine, Ser: 2.3 mg/dL — ABNORMAL HIGH (ref 0.57–1.00)
GFR calc Af Amer: 23 mL/min/{1.73_m2} — ABNORMAL LOW (ref >59)
GFR calc non Af Amer: 20 mL/min/{1.73_m2} — ABNORMAL LOW (ref >59)
GLUCOSE: 326 mg/dL — AB (ref 65–99)
Potassium: 5.2 mmol/L (ref 3.5–5.2)
Sodium: 137 mmol/L (ref 134–144)

## 2018-02-13 LAB — LACTIC ACID, PLASMA
Lactic Acid, Venous: 3.2 mmol/L (ref 0.5–1.9)
Lactic Acid, Venous: 3 mmol/L (ref 0.5–1.9)
Lactic Acid, Venous: 2.9 mmol/L (ref 0.5–1.9)
Lactic Acid, Venous: 2.2 mmol/L (ref 0.5–1.9)

## 2018-02-13 LAB — T3, FREE: T3, Free: 2.3 pg/mL (ref 2.0–4.4)

## 2018-02-13 LAB — PROTIME-INR
INR: 1.02
PROTHROMBIN TIME: 13.3 s (ref 11.4–15.2)

## 2018-02-13 LAB — T4, FREE: Free T4: 0.96 ng/dL (ref 0.82–1.77)

## 2018-02-13 LAB — GLUCOSE, CAPILLARY
Glucose-Capillary: 178 mg/dL — ABNORMAL HIGH (ref 70–99)
Glucose-Capillary: 219 mg/dL — ABNORMAL HIGH (ref 70–99)

## 2018-02-13 LAB — TSH: TSH: 4.84 u[IU]/mL — ABNORMAL HIGH (ref 0.450–4.500)

## 2018-02-13 MED ORDER — OLANZAPINE 5 MG PO TABS
10.0000 mg | ORAL_TABLET | Freq: Every day | ORAL | Status: DC
  Administered 2018-02-13 – 2018-02-15 (×3): 10 mg via ORAL
  Filled 2018-02-13 (×3): qty 2

## 2018-02-13 MED ORDER — POLYETHYLENE GLYCOL 3350 17 G PO PACK
17.0000 g | PACK | Freq: Every day | ORAL | Status: DC | PRN
  Administered 2018-02-14: 17 g via ORAL
  Filled 2018-02-13: qty 1

## 2018-02-13 MED ORDER — ACETAMINOPHEN 325 MG PO TABS
650.0000 mg | ORAL_TABLET | Freq: Four times a day (QID) | ORAL | Status: DC | PRN
  Administered 2018-02-14 – 2018-02-16 (×5): 650 mg via ORAL
  Filled 2018-02-13 (×5): qty 2

## 2018-02-13 MED ORDER — SODIUM CHLORIDE 0.9 % IV SOLN
INTRAVENOUS | Status: AC
  Administered 2018-02-13 (×2): via INTRAVENOUS

## 2018-02-13 MED ORDER — AMLODIPINE BESYLATE 5 MG PO TABS
10.0000 mg | ORAL_TABLET | Freq: Every day | ORAL | Status: DC
  Administered 2018-02-13 – 2018-02-16 (×4): 10 mg via ORAL
  Filled 2018-02-13 (×4): qty 2

## 2018-02-13 MED ORDER — SODIUM CHLORIDE 0.9 % IV BOLUS
1000.0000 mL | Freq: Once | INTRAVENOUS | Status: AC
  Administered 2018-02-13: 1000 mL via INTRAVENOUS

## 2018-02-13 MED ORDER — INSULIN GLARGINE 100 UNIT/ML ~~LOC~~ SOLN
10.0000 [IU] | Freq: Every day | SUBCUTANEOUS | Status: DC
  Administered 2018-02-13 – 2018-02-14 (×2): 10 [IU] via SUBCUTANEOUS
  Filled 2018-02-13 (×3): qty 0.1

## 2018-02-13 MED ORDER — IPRATROPIUM-ALBUTEROL 0.5-2.5 (3) MG/3ML IN SOLN
3.0000 mL | Freq: Four times a day (QID) | RESPIRATORY_TRACT | Status: DC
  Administered 2018-02-13: 3 mL via RESPIRATORY_TRACT
  Filled 2018-02-13: qty 3

## 2018-02-13 MED ORDER — UMECLIDINIUM BROMIDE 62.5 MCG/INH IN AEPB
1.0000 | INHALATION_SPRAY | Freq: Every day | RESPIRATORY_TRACT | Status: DC
  Administered 2018-02-14 – 2018-02-16 (×3): 1 via RESPIRATORY_TRACT
  Filled 2018-02-13: qty 7

## 2018-02-13 MED ORDER — ENOXAPARIN SODIUM 30 MG/0.3ML ~~LOC~~ SOLN
30.0000 mg | SUBCUTANEOUS | Status: DC
  Administered 2018-02-13: 30 mg via SUBCUTANEOUS
  Filled 2018-02-13: qty 0.3

## 2018-02-13 MED ORDER — INSULIN ASPART 100 UNIT/ML ~~LOC~~ SOLN
0.0000 [IU] | Freq: Three times a day (TID) | SUBCUTANEOUS | Status: DC
  Administered 2018-02-13 – 2018-02-14 (×2): 2 [IU] via SUBCUTANEOUS
  Administered 2018-02-14: 3 [IU] via SUBCUTANEOUS
  Administered 2018-02-14 – 2018-02-15 (×2): 5 [IU] via SUBCUTANEOUS
  Administered 2018-02-15: 3 [IU] via SUBCUTANEOUS
  Administered 2018-02-15: 9 [IU] via SUBCUTANEOUS

## 2018-02-13 MED ORDER — PRAVASTATIN SODIUM 40 MG PO TABS
40.0000 mg | ORAL_TABLET | Freq: Every evening | ORAL | Status: DC
  Administered 2018-02-13 – 2018-02-15 (×4): 40 mg via ORAL
  Filled 2018-02-13 (×3): qty 1

## 2018-02-13 MED ORDER — ACETAMINOPHEN 650 MG RE SUPP: 650.0000 mg | Freq: Four times a day (QID) | RECTAL | Status: DC | PRN

## 2018-02-13 MED ORDER — QUETIAPINE FUMARATE 25 MG PO TABS
25.0000 mg | ORAL_TABLET | Freq: Every day | ORAL | Status: DC
  Administered 2018-02-13 – 2018-02-15 (×3): 25 mg via ORAL
  Filled 2018-02-13 (×3): qty 1

## 2018-02-13 MED ORDER — ALBUTEROL SULFATE (2.5 MG/3ML) 0.083% IN NEBU: 2.5000 mg | INHALATION_SOLUTION | RESPIRATORY_TRACT | Status: DC | PRN

## 2018-02-13 MED ORDER — SODIUM CHLORIDE 0.9% FLUSH
3.0000 mL | Freq: Once | INTRAVENOUS | Status: AC
  Administered 2018-02-13: 3 mL via INTRAVENOUS

## 2018-02-13 MED ORDER — METOPROLOL TARTRATE 25 MG PO TABS
50.0000 mg | ORAL_TABLET | Freq: Two times a day (BID) | ORAL | Status: DC
  Administered 2018-02-13 – 2018-02-16 (×6): 50 mg via ORAL
  Filled 2018-02-13 (×6): qty 2

## 2018-02-13 NOTE — Progress Notes (Signed)
CRITICAL VALUE ALERT  Critical Value:  Lactic acid 2.2  Date & Time Notied: 02/13/2018 @ 2313  Provider Notified: Inpatient service pager   Orders Received/Actions taken: awaiting orders

## 2018-02-13 NOTE — Telephone Encounter (Signed)
Called patient and son.  Labs returned showing rising creatinine, worsening acidosis, and hyperglycemia.  The patient also has gapped acidosis which has worsened.  Given her worsening labs I recommended immediate evaluation in the emergency department.  Spoke with both the patient and her son about this recommendation.  They are agreeable and will call EMS.  The patient has newly diagnosed type 2 diabetes.  Her A1c is 10.6.  Her glucose level is not surprising.  She is very fearful of needles as is her son.  She yesterday she was started on oral agents with the intent to start her on Lantus.  I suspect she will require significant education regarding the use of her insulin.  In terms of the patient's acidosis this is actually been present for more than a year.  She likely has a component of RTA with the gap component hyperglycemia is also playing a role.  [name], MD  Family Medicine Teaching Service

## 2018-02-13 NOTE — ED Provider Notes (Signed)
[facility] EMERGENCY DEPARTMENT Provider Note   CSN: [id number] Arrival date & time: 02/13/18  1017     History   Chief Complaint Chief Complaint  Patient presents with  . abnormal labs    HPI Abigail Wallace is a 73 y.o. female.  73 year old female with prior medical history as detailed below presents for evaluation of worsening renal function.  Patient reports that she has had worsening creatinines over the last several weeks.  Patient was sent here by her family medicine primary care provider for admission.  Patient complains of decreased p.o. intake over the last 2 to 3 days.  She denies abdominal pain.  She denies fever.  She denies current chest pain, shortness of breath, nausea, vomiting, or other acute complaint.  The history is provided by the patient and medical records.  Illness  Location:  Worsening kidney function Severity:  Moderate Onset quality:  Gradual Duration:  5 days Timing:  Constant Progression:  Worsening Chronicity:  New Associated symptoms: no abdominal pain and no chest pain     Past Medical History:  Diagnosis Date  . Atrial fibrillation ([place])   . COPD (chronic obstructive pulmonary disease) ([place])   . Coronary artery disease    NON-CRITICAL  . GERD (gastroesophageal reflux disease)   . Gout   . Hypertension   . Memory difficulties 12/02/2013  . Mental disorder   . Psychosis ([place])   . Tardive dyskinesia 12/02/2013  . Type 2 diabetes mellitus [facility])     Patient Active Problem List   Diagnosis Date Noted  . AKI (acute kidney injury) ([place]) 02/13/2018  . Morbid obesity ([name]) 02/07/2018  . Paroxysmal atrial fibrillation ([place]) 01/28/2018  . Dysphagia 07/12/2017  . Dependence on wheelchair 05/17/2017  . Ectatic abdominal aorta ([place]) 01/16/2017  . Lesion of left native kidney 01/16/2017  . Long-term use of high-risk medication 10/11/2016  . CKD stage IIIb 09/12/2016  . Subclinical hypothyroidism 04/27/2015  . DUB  (dysfunctional uterine bleeding) 02/22/2015  . Tobacco use disorder 05/10/2014  . Memory difficulties 12/02/2013  . Tardive dyskinesia 12/02/2013  . Essential hypertension 04/22/2013  . Pressure sore on buttocks 04/10/2012  . Gout 09/26/2011  . Dysuria 06/21/2011  . Overactive bladder 03/27/2010  . Vaginal prolapse 03/27/2010  . Sleep apnea 03/27/2010  . Osteoarthritis of right knee 03/27/2010  . Lymphedema of left leg 02/17/2010  . Resting tremor 12/15/2009  . Chronic diastolic heart failure ([place]) 09/30/2009  . Atherosclerosis of coronary artery of native heart without angina pectoris 10/18/2006  . Sick sinus syndrome ([place]) 04/24/2006  . Mixed hyperlipidemia 03/14/2006  . Paranoid schizophrenia ([place]) 03/14/2006  . COPD 03/14/2006    Past Surgical History:  Procedure Laterality Date  . CARDIAC CATHETERIZATION  2007   Non-critical.   . CHOLECYSTECTOMY    . EP IMPLANTABLE DEVICE N/A 08/31/2014   Procedure: PPM Generator Changeout;  Surgeon: Sanda Klein, MD;  Location: [facility];  Service: Cardiovascular;  Laterality: N/A;  . INSERT / REPLACE / REMOVE PACEMAKER  2007   [place]/SYMPTOMATIC HEART BLOCK  . PACEMAKER PLACEMENT  09/28/2005   2/2 SSS?  . Persantine stress test  05/01/2010   EF 66%. Normal LV sys fx. Unchanged from previous studies.   . TRANSTHORACIC ECHOCARDIOGRAM  09/08/10   SEVERE CONCENTRIC HYPERTROPHY.LV FUNCTION WAS VIGOROUS.EF 65%-70%.VENTRICULAR SEPTUM-INCOORDINATE MOTION.LEFT ATRIUM-MILDLY DILATED.TRIVIAL TR.  . TUBAL LIGATION       OB History   No obstetric history on file.  Home Medications    Prior to Admission medications   Medication Sig Start Date End Date Taking? Authorizing Provider  acetaminophen (TYLENOL) 500 MG tablet Take 1,000 mg by mouth at bedtime.     [provider]  albuterol (PROVENTIL HFA;VENTOLIN HFA) 108 (90 Base) MCG/ACT inhaler Inhale 2 puffs into the lungs every 6 (six) hours as needed for wheezing  or shortness of breath. 01/24/18   Martyn Malay, MD  albuterol (PROVENTIL) (2.5 MG/3ML) 0.083% nebulizer solution Take 3 mLs (2.5 mg total) by nebulization every 6 (six) hours as needed for wheezing or shortness of breath. 02/12/18   Martyn Malay, MD  allopurinol (ZYLOPRIM) 100 MG tablet TAKE 1 TABLET (100 MG TOTAL) BY MOUTH DAILY. 08/01/17   Croitoru, Mihai, MD  amLODipine (NORVASC) 10 MG tablet Take 1 tablet (10 mg total) by mouth daily. 04/17/17   [place] Bing, DO  apixaban (ELIQUIS) 5 MG TABS tablet Take 1 tablet (5 mg total) by mouth 2 (two) times daily. 02/05/18   Croitoru, Mihai, MD  Blood Glucose Monitoring Suppl (ONETOUCH VERIO) w/Device KIT 1 Device by Does not apply route 3 (three) times a week. 02/12/18   Martyn Malay, MD  diclofenac sodium (VOLTAREN) 1 % GEL Apply 4 g topically 4 (four) times daily as needed (joint pain). 06/07/17   [place] Bing, DO  glipiZIDE (GLUCOTROL) 5 MG tablet Take 1 tablet (5 mg total) by mouth daily before breakfast. 02/12/18   Martyn Malay, MD  glucose blood test strip Test three time weekly 02/12/18   Martyn Malay, MD  guaiFENesin (ROBITUSSIN) 100 MG/5ML SOLN Take 5 mLs (100 mg total) by mouth every 4 (four) hours as needed for cough or to loosen phlegm. 01/24/18   Martyn Malay, MD  Incontinence Supplies MISC 1 Units by Does not apply route as needed. 12/13/15   McKeag, Marylynn Pearson, MD  Lancet Device MISC 1 Device by Does not apply route 3 (three) times a week. 02/12/18   Martyn Malay, MD  linagliptin (TRADJENTA) 5 MG TABS tablet Take 1 tablet (5 mg total) by mouth daily. 02/12/18   Martyn Malay, MD  losartan (COZAAR) 100 MG tablet TAKE 1 TABLET BY MOUTH DAILY 08/05/17   Croitoru, Mihai, MD  metoprolol tartrate (LOPRESSOR) 50 MG tablet TAKE 1 TABLET BY MOUTH TWICE A DAY 08/01/17   Croitoru, Mihai, MD  nitroGLYCERIN (NITROSTAT) 0.4 MG SL tablet Place 1 tablet (0.4 mg total) under the tongue every 5 (five) minutes x 3 doses as needed. For chest  11/13/16   Croitoru, Mihai, MD  nystatin cream (MYCOSTATIN) Apply 1 application topically 2 (two) times daily. 04/20/16   McKeag, Marylynn Pearson, MD  OLANZapine (ZYPREXA) 10 MG tablet Take 10 mg by mouth at bedtime. 06/08/14   [provider]  pravastatin (PRAVACHOL) 40 MG tablet TAKE 1 TABLET BY MOUTH DAILY IN THE EVENING 04/15/17   Croitoru, Mihai, MD  QUEtiapine (SEROQUEL) 25 MG tablet Take 25 mg by mouth at bedtime.  06/08/14   [provider]  umeclidinium bromide (INCRUSE ELLIPTA) 62.5 MCG/INH AEPB Inhale 1 puff into the lungs daily. 01/24/18   Martyn Malay, MD    Family History Family History  Problem Relation Age of Onset  . Heart disease Mother   . Kidney disease Mother   . Heart disease Father   . Kidney disease Father   . Stroke Sister   . Diabetes type II Son     Social History  Social History   Tobacco Use  . Smoking status: Current Some Day Smoker    Packs/day: 0.25    Types: Cigarettes  . Smokeless tobacco: Never Used  . Tobacco comment: 6 cigs a day  Substance Use Topics  . Alcohol use: No    Alcohol/week: 0.0 standard drinks  . Drug use: No     Allergies   Abilify [aripiprazole]; Clozapine; Benadryl [diphenhydramine hcl]; Cogentin [benztropine]; Haloperidol lactate; Latuda [lurasidone hcl]; Remeron [mirtazapine]; Codeine; and Latex   Review of Systems Review of Systems  Cardiovascular: Negative for chest pain.  Gastrointestinal: Negative for abdominal pain.  All other systems reviewed and are negative.    Physical Exam Updated Vital Signs BP (!) 146/68   Pulse 70   Temp (S) 99.5 F (37.5 C) (Oral)   Resp 13   Ht _0  (1.676 m)   Wt 131 kg   SpO2 99%   BMI 46.61 kg/m   Physical Exam Vitals signs and nursing note reviewed.  Constitutional:      General: She is not in acute distress.    Appearance: Normal appearance. She is well-developed. She is obese.  HENT:     Head: Normocephalic and atraumatic.  Eyes:     Conjunctiva/sclera:  Conjunctivae normal.     Pupils: Pupils are equal, round, and reactive to light.  Neck:     Musculoskeletal: Normal range of motion and neck supple.  Cardiovascular:     Rate and Rhythm: Normal rate and regular rhythm.     Heart sounds: Normal heart sounds.  Pulmonary:     Effort: Pulmonary effort is normal. No respiratory distress.     Breath sounds: Normal breath sounds.  Abdominal:     General: There is no distension.     Palpations: Abdomen is soft.     Tenderness: There is no abdominal tenderness.  Musculoskeletal: Normal range of motion.        General: No deformity.  Skin:    General: Skin is warm and dry.  Neurological:     Mental Status: She is alert and oriented to person, place, and time.      ED Treatments / Results  Labs (all labs ordered are listed, but only abnormal results are displayed) Labs Reviewed  COMPREHENSIVE METABOLIC PANEL - Abnormal; Notable for the following components:      Result Value   CO2 15 (*)    Glucose, Bld 290 (*)    BUN 43 (*)    Creatinine, Ser 2.26 (*)    GFR calc non Af Amer 21 (*)    GFR calc Af Amer 24 (*)    All other components within normal limits  LACTIC ACID, PLASMA - Abnormal; Notable for the following components:   Lactic Acid, Venous 3.2 (*)    All other components within normal limits  LACTIC ACID, PLASMA - Abnormal; Notable for the following components:   Lactic Acid, Venous 3.0 (*)    All other components within normal limits  CBC WITH DIFFERENTIAL/PLATELET - Abnormal; Notable for the following components:   WBC 12.2 (*)    RBC 3.71 (*)    Hemoglobin 10.1 (*)    HCT 33.3 (*)    Abs Immature Granulocytes 0.26 (*)    All other components within normal limits  PROTIME-INR  URINALYSIS, ROUTINE W REFLEX MICROSCOPIC    EKG EKG Interpretation  Date/Time:  Thursday February 13 2018 10:34:03 EST Ventricular Rate:  72 PR Interval:  244 QRS Duration: 140 QT Interval:  442 QTC Calculation: 483 R Axis:   -62 Text  Interpretation:  AV dual-paced rhythm with prolonged AV conduction Abnormal ECG Confirmed by Dene Gentry [phone]) on 02/13/2018 11:10:05 AM   Radiology Dg Chest 2 View  Result Date: 02/13/2018 CLINICAL DATA:  Chest pain and shortness of breath for few days EXAM: CHEST - 2 VIEW COMPARISON:  January 15, 2017 FINDINGS: The heart size is enlarged. Cardiac pacemaker is unchanged. Mediastinal contour is normal. There is mild increased interstitial markings in both lungs. No focal pneumonia or pleural effusion is identified. The bony structures are stable. IMPRESSION: Mild interstitial edema.  Cardiomegaly. Electronically Signed   By: Abelardo Diesel M.D.   On: 02/13/2018 11:31    Procedures Procedures (including critical care time)  Medications Ordered in ED Medications  sodium chloride flush (NS) 0.9 % injection 3 mL (3 mLs Intravenous Given 02/13/18 1103)  sodium chloride 0.9 % bolus 1,000 mL (1,000 mLs Intravenous New Bag/Given 02/13/18 1156)     Initial Impression / Assessment and Plan / ED Course  I have reviewed the triage vital signs and the nursing notes.  Pertinent labs & imaging results that were available during my care of the patient were reviewed by me and considered in my medical decision making (see chart for details).     MDM  Screen complete  Patient is presenting for evaluation of worsening kidney function.  Patient reports that her primary provider is at the family medicine clinic and been following her kidney function over the last several weeks.  Patient was called today secondary to her labs showed an increased creatinine yesterday.  Patient is reporting generalized weakness today.  Work-up suggests possible dehydration and acute kidney injury.  Family medicine teaching team is aware of case and will evaluate for admission.  Final Clinical Impressions(s) / ED Diagnoses   Final diagnoses:  Acute kidney injury [facility])    ED Discharge Orders    None         Valarie Merino, MD 02/13/18 1440

## 2018-02-13 NOTE — Telephone Encounter (Signed)
Pt's son calls once again and wanted to speak to Dr. [name]. Please call pts back at [phone]. ad

## 2018-02-13 NOTE — ED Triage Notes (Signed)
Pt called by doctor's office for abnormal labs- has not received flu shot this year--  Saw family practice dr yesterday- was taking robitussin until yesterday-- is running a low grade temp today.

## 2018-02-13 NOTE — Telephone Encounter (Signed)
Pt's son called and wanted to talk to Dr. [name] about her lab results. Please call the pt's son back at [phone]. ad

## 2018-02-13 NOTE — Progress Notes (Signed)
CRITICAL VALUE ALERT  Critical Value:  Lactic acid 2.9  Date & Time Notied:  02/13/2018 @1955   Provider Notified: Ouida Sills  Orders Received/Actions taken: Provider confirmed message received at [place].

## 2018-02-13 NOTE — Telephone Encounter (Signed)
Please call son and find out any additional questions he may have. I am happy to call again this afternoon and answer further questions.  [name], MD  Family Medicine Teaching Service

## 2018-02-13 NOTE — Telephone Encounter (Signed)
Called patient's son [name] and he is a bit upset and frustrated because he is still at the hospital with his mom. He wants to speak to Dr. [name].    Patient is receiving labs and fluids at present.  [name] states that he feels that Dr. [name] should be patient's doctor at the hospital.  He was told that she would not be in charge of patient's care while in the hospital. [name] states that they have grown to trust her and her mother only feels comfortable with Dr. [name].  Also [name] states that he was told that his mother may not be admitted for observation as recommended by Dr. [name]. He does not understand all of this and is frustrated as to why the ED would discharge her.  I tried calming him down by telling him to wait and see what the results of the test are and then we could go from there. Finally, he agreed to not speak to Dr. [name] again until the final results are in.  Ozella Almond, [place]

## 2018-02-13 NOTE — H&P (Addendum)
[facility] Admission History and Physical Service Pager: [phone]  Patient name: Abigail Wallace Medical record number: [id number] Date of birth: August 04, 1945 Age: 73 y.o. Gender: female  Primary Care Provider: Martyn Malay, MD Consultants: none Code Status: full  Chief Complaint: fatigue, cough, congestion.   Assessment and Plan: Abigail Wallace is a 73 y.o. female presenting with fatigue, URI symptoms found to have an AKI and mild COPD exacerbation . PMH is significant for paranoid schizophrenia, COPD, diastolic heart failure, HLD, gout, CKD-IIIb, afib, complete heart block.   AKI on CKD-IIIb d/t dehydration-  Patient has chronic complaints of polyuria. Creatinine was increased at 2.3 during office visit the day before admission and 2.26 on admission today. Previous Creatinine this year were 1.49 and 1.88.  BUN was 43 today in the ED.  Lactic acid was 3.2. Urinalysis did not show signs of infection.  patient received a 1L NS bolus in the ED.  Per Dr. Saul Wallace clinic note on 1/29 she has an exophytic lesion on the lateral cortex of the R kidney that could be a hemorrhagic cyst or solid kidney lesion and will need eventual further workup with MRI or CT.  - admit to obs, tele.  Dr. Ardelia Wallace attending.  - 1L NS bolus, MIVF for 12 hrs and monitor UOP - 132m/hr NS mIVF - trend LA to normal - f/u AM BMP - vitals - carb controlled diet  COPD exacerbation - patient has complained of URI symptoms for approximately three weeks, with nasal congestion, rhinorrhea, and productive cough.  On exam patient has wheezing bilaterally in the upper lung fields. She is satting in the 90s on room air. CXR did not show signs of pneumonia or pelrual effusion. This is a mild exacerbation, and there are no signs of pneumonia so we are holding azithromycin and prednisone for now, given concern for tipping her into DKA.  At home patient takes incruse ellipta and albuterol nebulizer.  -  continue home incruse ellipta - duonebs q6h - albuterol q2h PRN - incentive spirometry - pulse oximetry - holding azithro and prednisone given significant hyperglycemia recently and mild severity  Non-insulin dependant DM-II, uncontrolled - patient had blood glucose of 326 yesterday and 290 today in ED. A1c is 10.6. there was concern based on recent labwork that patient may be in DKA given her low bicarb and high glucose, but bicarb increased today and anion gp is 12. Patient showing no signs of being in DKA on exam today. Patient has not started insulin yet at home.  Taking glipizide and linagliptin at home - hold home meds  - lantus 10 U - sSSI  - CBG TID and qHS  Paroxysmal A-Fib - episode of A-fib over one day in duration was recorded on patient's pacemaker, per cardiology note.  This was the first and only recorded episode of atrial fibrillation. On eliquis 57mBID at home.  - continous cardiac monitoring  - hold eliquis.   -On lovenox for DVT prophylaxis  Complete Heart Block - patient has dual chambered Medtronic Adapta implanted in August 2016.  Recent office visit with her cardiologist states she has 100% ventricular pacing and 50% atrial pacing.  -  Continuous cardiac monitoring  Paranoid Schizophrenia - on quetiapine and olanzapine as well as depakote per recent office visit with Dr. BrOwens SharkPCP. Depakote not listed in home meds - continue quetiapine and olanzapine  Gout - on allopurinol at home. Not currently having a gout flare.  - holding  allopurinol.    FEN/GI: carb controlled, 140m/hr NS,  Prophylaxis: lovenox  Disposition: med telemetry   History of Present Illness:  Abigail Meskillis a 73y.o. female presenting with fatigue, cough, congestion  Patient presents to the ED after a recent visit to the family medicine clinic revealed that she had worsening kidney function and possibly diabetic ketoacidosis. Patient has polyuria which is chronic for her. Her son, RChriss Wallace  says she is at her baseline mentally and hasn't shown signs of confusion. Patient was brought to the ED by her children.   Patient states she has had a  Productive cough, congestion, rhinorrhea, and wheezing.  for at least three weeks.  Her wheezing improved with her Incruse Ellipta and albuterol.  Patient smokes about 1/4 pack of cigarettes per day. She has also reported increased fatigue and sleepiness over the past week.  She has been sleeping 16-18 hours a day which is unusual for her.  Patient also had 2 episodes of non bloody diarrhea 48 hours ago which has resolved.    Review Of Systems: See HPI for pertinent.  Review of Systems  Constitutional: Negative for chills and fever.  HENT: Positive for congestion. Negative for sore throat.   Eyes: Negative for blurred vision and double vision.  Respiratory: Positive for cough, sputum production, shortness of breath and wheezing.   Cardiovascular: Negative for chest pain.  Gastrointestinal: Negative for abdominal pain, blood in stool, constipation, diarrhea, melena, nausea and vomiting.  Genitourinary: Positive for frequency. Negative for dysuria and hematuria.  Musculoskeletal: Positive for joint pain. Negative for falls and myalgias.  Skin: Negative for itching and rash.  Neurological: Positive for headaches. Negative for dizziness.    Patient Active Problem List   Diagnosis Date Noted  . AKI (acute kidney injury) ([place] 02/13/2018  . Morbid obesity ([place] 02/07/2018  . Paroxysmal atrial fibrillation ([place] 01/28/2018  . Dysphagia 07/12/2017  . Dependence on wheelchair 05/17/2017  . Ectatic abdominal aorta ([place] 01/16/2017  . Lesion of left native kidney 01/16/2017  . Long-term use of high-risk medication 10/11/2016  . CKD stage IIIb 09/12/2016  . Subclinical hypothyroidism 04/27/2015  . DUB (dysfunctional uterine bleeding) 02/22/2015  . Tobacco use disorder 05/10/2014  . Memory difficulties 12/02/2013  . Tardive dyskinesia  12/02/2013  . Essential hypertension 04/22/2013  . Pressure sore on buttocks 04/10/2012  . Gout 09/26/2011  . Dysuria 06/21/2011  . Overactive bladder 03/27/2010  . Vaginal prolapse 03/27/2010  . Sleep apnea 03/27/2010  . Osteoarthritis of right knee 03/27/2010  . Lymphedema of left leg 02/17/2010  . Resting tremor 12/15/2009  . Chronic diastolic heart failure ([place] 09/30/2009  . Atherosclerosis of coronary artery of native heart without angina pectoris 10/18/2006  . Sick sinus syndrome ([place] 04/24/2006  . Mixed hyperlipidemia 03/14/2006  . Paranoid schizophrenia ([place] 03/14/2006  . COPD 03/14/2006    Past Medical History: Past Medical History:  Diagnosis Date  . Atrial fibrillation ([facility]   . COPD (chronic obstructive pulmonary disease) ([place]   . Coronary artery disease    NON-CRITICAL  . GERD (gastroesophageal reflux disease)   . Gout   . Hypertension   . Memory difficulties 12/02/2013  . Mental disorder   . Psychosis ([place]   . Tardive dyskinesia 12/02/2013  . Type 2 diabetes mellitus ([place]     Past Surgical History: Past Surgical History:  Procedure Laterality Date  . CARDIAC CATHETERIZATION  2007   Non-critical.   . CHOLECYSTECTOMY    . EP IMPLANTABLE DEVICE N/A  08/31/2014   Procedure: PPM Generator Changeout;  Surgeon: Sanda Klein, MD;  Location: [facility];  Service: Cardiovascular;  Laterality: N/A;  . INSERT / REPLACE / REMOVE PACEMAKER  2007   [place]/SYMPTOMATIC HEART BLOCK  . PACEMAKER PLACEMENT  09/28/2005   2/2 SSS?  . Persantine stress test  05/01/2010   EF 66%. Normal LV sys fx. Unchanged from previous studies.   . TRANSTHORACIC ECHOCARDIOGRAM  09/08/10   SEVERE CONCENTRIC HYPERTROPHY.LV FUNCTION WAS VIGOROUS.EF 65%-70%.VENTRICULAR SEPTUM-INCOORDINATE MOTION.LEFT ATRIUM-MILDLY DILATED.TRIVIAL TR.  . TUBAL LIGATION      Social History: Social History   Tobacco Use  . Smoking status: Current Some Day Smoker    Packs/day: 0.25     Types: Cigarettes  . Smokeless tobacco: Never Used  . Tobacco comment: 6 cigs a day  Substance Use Topics  . Alcohol use: No    Alcohol/week: 0.0 standard drinks  . Drug use: No    Family History: Family History  Problem Relation Age of Onset  . Heart disease Mother   . Kidney disease Mother   . Heart disease Father   . Kidney disease Father   . Stroke Sister   . Diabetes type II Son     Allergies and Medications: Allergies  Allergen Reactions  . Abilify [Aripiprazole] Anaphylaxis, Shortness Of Breath and Other (See Comments)    Tremors  . Clozapine Anaphylaxis  . Benadryl [Diphenhydramine Hcl] Other (See Comments)    States affects her vision  . Cogentin [Benztropine] Other (See Comments)    Affects mobility  . Haloperidol Lactate Other (See Comments)    Vision changes, Shaking, Drooling  . Latuda [Lurasidone Hcl] Other (See Comments)    "Tremors and shakes."   . Remeron [Mirtazapine] Other (See Comments)    Tremors and shakes  . Codeine Other (See Comments)    Unknown   . Latex Rash   No current facility-administered medications on file prior to encounter.    Current Outpatient Medications on File Prior to Encounter  Medication Sig Dispense Refill  . acetaminophen (TYLENOL) 500 MG tablet Take 1,000 mg by mouth at bedtime.     Marland Kitchen albuterol (PROVENTIL HFA;VENTOLIN HFA) 108 (90 Base) MCG/ACT inhaler Inhale 2 puffs into the lungs every 6 (six) hours as needed for wheezing or shortness of breath. 1 Inhaler 2  . albuterol (PROVENTIL) (2.5 MG/3ML) 0.083% nebulizer solution Take 3 mLs (2.5 mg total) by nebulization every 6 (six) hours as needed for wheezing or shortness of breath. 150 mL 1  . allopurinol (ZYLOPRIM) 100 MG tablet TAKE 1 TABLET (100 MG TOTAL) BY MOUTH DAILY. 30 tablet 7  . amLODipine (NORVASC) 10 MG tablet Take 1 tablet (10 mg total) by mouth daily. 90 tablet 3  . apixaban (ELIQUIS) 5 MG TABS tablet Take 1 tablet (5 mg total) by mouth 2 (two) times daily. 60  tablet 11  . Blood Glucose Monitoring Suppl (ONETOUCH VERIO) w/Device KIT 1 Device by Does not apply route 3 (three) times a week. 1 kit 1  . diclofenac sodium (VOLTAREN) 1 % GEL Apply 4 g topically 4 (four) times daily as needed (joint pain). 200 g 2  . glipiZIDE (GLUCOTROL) 5 MG tablet Take 1 tablet (5 mg total) by mouth daily before breakfast. 90 tablet 3  . glucose blood test strip Test three time weekly 100 each 12  . guaiFENesin (ROBITUSSIN) 100 MG/5ML SOLN Take 5 mLs (100 mg total) by mouth every 4 (four) hours as needed for cough or  to loosen phlegm. 236 mL 0  . Incontinence Supplies MISC 1 Units by Does not apply route as needed. 100 each prn  . Lancet Device MISC 1 Device by Does not apply route 3 (three) times a week. 1 each 11  . linagliptin (TRADJENTA) 5 MG TABS tablet Take 1 tablet (5 mg total) by mouth daily. 90 tablet 3  . losartan (COZAAR) 100 MG tablet TAKE 1 TABLET BY MOUTH DAILY 90 tablet 2  . metoprolol tartrate (LOPRESSOR) 50 MG tablet TAKE 1 TABLET BY MOUTH TWICE A DAY 60 tablet 7  . nitroGLYCERIN (NITROSTAT) 0.4 MG SL tablet Place 1 tablet (0.4 mg total) under the tongue every 5 (five) minutes x 3 doses as needed. For chest 25 tablet 1  . nystatin cream (MYCOSTATIN) Apply 1 application topically 2 (two) times daily. 30 g 3  . OLANZapine (ZYPREXA) 10 MG tablet Take 10 mg by mouth at bedtime.  0  . pravastatin (PRAVACHOL) 40 MG tablet TAKE 1 TABLET BY MOUTH DAILY IN THE EVENING 30 tablet 6  . QUEtiapine (SEROQUEL) 25 MG tablet Take 25 mg by mouth at bedtime.   0  . umeclidinium bromide (INCRUSE ELLIPTA) 62.5 MCG/INH AEPB Inhale 1 puff into the lungs daily. 30 each 3    Objective: BP (!) 146/68   Pulse 70   Temp (S) 99.5 F (37.5 C) (Oral)   Resp 13   Ht _0  (1.676 m)   Wt 131 kg   SpO2 99%   BMI 46.61 kg/m  Exam: General: alert and oriented.  No acute distress.  Sitting up in bed.  Eyes: PERRL.  EOMI.   ENTM: moist oral mucosa.  Uvula midline.    Cardiovascular: heart sounds distant. Regular rhythm. Normal rate. 2+ radial pulses bilaterally. No pitting edema.  Respiratory: wheezing heard bilaterally in the upper lung fields.  No increased work of breathing.  On room air. No crackles.  Gastrointestinal: soft, mildly tender in the LUQ. Normal bowel sounds.  Derm: no rashes. Skin warm and dry.  Neuro: cranial nerves grossly intact.   Psych: pleasant affect.  Answers questions appropriately.    Labs and Imaging: CBC BMET  Recent Labs  Lab 02/13/18 1040  WBC 12.2*  HGB 10.1*  HCT 33.3*  PLT 271   Recent Labs  Lab 02/13/18 1040  NA 135  K 4.3  CL 108  CO2 15*  BUN 43*  CREATININE 2.26*  GLUCOSE 290*  CALCIUM 9.4      Benay Pike, MD 02/13/2018, 2:12 PM PGY-1, [place] Intern pager: [phone], text pages welcome  Upper Level Addendum: I have seen and evaluated this patient along with Dr. Jeannine Kitten and reviewed the above note, making necessary revisions in blue.  Harriet [place], [place], PGY-3

## 2018-02-13 NOTE — Progress Notes (Signed)
Patient arrived from E.D via strecher.Placed in telemetry bed comfortably.She is awake alert and oriented x 4.Skin assessed with Urban Gibson R.N.-no skin issue noted.

## 2018-02-13 NOTE — ED Notes (Signed)
Attempted report x1. 

## 2018-02-13 NOTE — Progress Notes (Signed)
Pt. Son called. Stated pt. Needed to take Depakote at bed time. Provider paged.

## 2018-02-13 NOTE — Telephone Encounter (Signed)
Pt's son called once again. He has another question for Dr. [name]. Please call pts son back at [phone]. ad

## 2018-02-13 NOTE — Telephone Encounter (Signed)
Called patient and son back. Son wanted to know BUN level. Reviewed labs.   [name], MD  Family Medicine Teaching Service

## 2018-02-14 ENCOUNTER — Inpatient Hospital Stay (HOSPITAL_COMMUNITY): Payer: Medicare Other

## 2018-02-14 ENCOUNTER — Other Ambulatory Visit: Payer: Self-pay

## 2018-02-14 ENCOUNTER — Encounter (HOSPITAL_COMMUNITY): Payer: Self-pay | Admitting: Nephrology

## 2018-02-14 DIAGNOSIS — J441 Chronic obstructive pulmonary disease with (acute) exacerbation: Secondary | ICD-10-CM | POA: Diagnosis present

## 2018-02-14 DIAGNOSIS — E872 Acidosis: Secondary | ICD-10-CM | POA: Diagnosis present

## 2018-02-14 DIAGNOSIS — M109 Gout, unspecified: Secondary | ICD-10-CM | POA: Diagnosis present

## 2018-02-14 DIAGNOSIS — K219 Gastro-esophageal reflux disease without esophagitis: Secondary | ICD-10-CM | POA: Diagnosis present

## 2018-02-14 DIAGNOSIS — I442 Atrioventricular block, complete: Secondary | ICD-10-CM | POA: Diagnosis present

## 2018-02-14 DIAGNOSIS — E1122 Type 2 diabetes mellitus with diabetic chronic kidney disease: Secondary | ICD-10-CM | POA: Diagnosis not present

## 2018-02-14 DIAGNOSIS — I2510 Atherosclerotic heart disease of native coronary artery without angina pectoris: Secondary | ICD-10-CM | POA: Diagnosis present

## 2018-02-14 DIAGNOSIS — I480 Paroxysmal atrial fibrillation: Secondary | ICD-10-CM | POA: Diagnosis present

## 2018-02-14 DIAGNOSIS — Z6841 Body Mass Index (BMI) 40.0 and over, adult: Secondary | ICD-10-CM | POA: Diagnosis not present

## 2018-02-14 DIAGNOSIS — F200 Paranoid schizophrenia: Secondary | ICD-10-CM | POA: Diagnosis present

## 2018-02-14 DIAGNOSIS — I5032 Chronic diastolic (congestive) heart failure: Secondary | ICD-10-CM | POA: Diagnosis present

## 2018-02-14 DIAGNOSIS — E860 Dehydration: Secondary | ICD-10-CM | POA: Diagnosis present

## 2018-02-14 DIAGNOSIS — E782 Mixed hyperlipidemia: Secondary | ICD-10-CM | POA: Diagnosis present

## 2018-02-14 DIAGNOSIS — D631 Anemia in chronic kidney disease: Secondary | ICD-10-CM | POA: Diagnosis present

## 2018-02-14 DIAGNOSIS — I129 Hypertensive chronic kidney disease with stage 1 through stage 4 chronic kidney disease, or unspecified chronic kidney disease: Secondary | ICD-10-CM | POA: Diagnosis not present

## 2018-02-14 DIAGNOSIS — N179 Acute kidney failure, unspecified: Principal | ICD-10-CM

## 2018-02-14 DIAGNOSIS — Z9049 Acquired absence of other specified parts of digestive tract: Secondary | ICD-10-CM | POA: Diagnosis not present

## 2018-02-14 DIAGNOSIS — E1165 Type 2 diabetes mellitus with hyperglycemia: Secondary | ICD-10-CM | POA: Diagnosis present

## 2018-02-14 DIAGNOSIS — F0390 Unspecified dementia without behavioral disturbance: Secondary | ICD-10-CM | POA: Diagnosis present

## 2018-02-14 DIAGNOSIS — K529 Noninfective gastroenteritis and colitis, unspecified: Secondary | ICD-10-CM | POA: Diagnosis present

## 2018-02-14 DIAGNOSIS — F17210 Nicotine dependence, cigarettes, uncomplicated: Secondary | ICD-10-CM | POA: Diagnosis present

## 2018-02-14 DIAGNOSIS — N183 Chronic kidney disease, stage 3 (moderate): Secondary | ICD-10-CM | POA: Diagnosis not present

## 2018-02-14 DIAGNOSIS — Z950 Presence of cardiac pacemaker: Secondary | ICD-10-CM | POA: Diagnosis not present

## 2018-02-14 DIAGNOSIS — I130 Hypertensive heart and chronic kidney disease with heart failure and stage 1 through stage 4 chronic kidney disease, or unspecified chronic kidney disease: Secondary | ICD-10-CM | POA: Diagnosis present

## 2018-02-14 DIAGNOSIS — R944 Abnormal results of kidney function studies: Secondary | ICD-10-CM | POA: Diagnosis not present

## 2018-02-14 LAB — LACTIC ACID, PLASMA
Lactic Acid, Venous: 2.7 mmol/L (ref 0.5–1.9)
Lactic Acid, Venous: 2.2 mmol/L (ref 0.5–1.9)
Lactic Acid, Venous: 1.4 mmol/L (ref 0.5–1.9)
Lactic Acid, Venous: 1.3 mmol/L (ref 0.5–1.9)

## 2018-02-14 LAB — CBC
HCT: 31.3 % — ABNORMAL LOW (ref 36.0–46.0)
Hemoglobin: 9.7 g/dL — ABNORMAL LOW (ref 12.0–15.0)
MCH: 27.4 pg (ref 26.0–34.0)
MCHC: 31 g/dL (ref 30.0–36.0)
MCV: 88.4 fL (ref 80.0–100.0)
Platelets: 255 10*3/uL (ref 150–400)
RBC: 3.54 MIL/uL — ABNORMAL LOW (ref 3.87–5.11)
RDW: 14.9 % (ref 11.5–15.5)
WBC: 13 10*3/uL — ABNORMAL HIGH (ref 4.0–10.5)
nRBC: 0 % (ref 0.0–0.2)

## 2018-02-14 LAB — GLUCOSE, CAPILLARY
GLUCOSE-CAPILLARY: 255 mg/dL — AB (ref 70–99)
Glucose-Capillary: 233 mg/dL — ABNORMAL HIGH (ref 70–99)
Glucose-Capillary: 151 mg/dL — ABNORMAL HIGH (ref 70–99)
Glucose-Capillary: 164 mg/dL — ABNORMAL HIGH (ref 70–99)

## 2018-02-14 LAB — BASIC METABOLIC PANEL
Anion gap: 11 (ref 5–15)
BUN: 40 mg/dL — ABNORMAL HIGH (ref 8–23)
CO2: 15 mmol/L — ABNORMAL LOW (ref 22–32)
Calcium: 8.9 mg/dL (ref 8.9–10.3)
Chloride: 112 mmol/L — ABNORMAL HIGH (ref 98–111)
Creatinine, Ser: 2.02 mg/dL — ABNORMAL HIGH (ref 0.44–1.00)
GFR, EST AFRICAN AMERICAN: 27 mL/min — AB (ref >60)
GFR, EST NON AFRICAN AMERICAN: 24 mL/min — AB (ref >60)
Glucose, Bld: 226 mg/dL — ABNORMAL HIGH (ref 70–99)
Potassium: 3.8 mmol/L (ref 3.5–5.1)
SODIUM: 138 mmol/L (ref 135–145)

## 2018-02-14 LAB — TROPONIN I
Troponin I: <0.03 ng/mL (ref <0.03)
Troponin I: <0.03 ng/mL (ref <0.03)

## 2018-02-14 LAB — VALPROIC ACID LEVEL: Valproic Acid Lvl: <10 ug/mL — ABNORMAL LOW (ref 50.0–100.0)

## 2018-02-14 MED ORDER — STERILE WATER FOR INJECTION IV SOLN
INTRAVENOUS | Status: DC
  Administered 2018-02-14 – 2018-02-15 (×3): via INTRAVENOUS
  Filled 2018-02-14 (×8): qty 850

## 2018-02-14 MED ORDER — SODIUM CHLORIDE 0.9 % IV BOLUS
500.0000 mL | Freq: Once | INTRAVENOUS | Status: AC
  Administered 2018-02-14: 500 mL via INTRAVENOUS

## 2018-02-14 MED ORDER — IPRATROPIUM-ALBUTEROL 0.5-2.5 (3) MG/3ML IN SOLN
3.0000 mL | Freq: Three times a day (TID) | RESPIRATORY_TRACT | Status: DC
  Administered 2018-02-14 – 2018-02-15 (×3): 3 mL via RESPIRATORY_TRACT
  Filled 2018-02-14 (×3): qty 3

## 2018-02-14 MED ORDER — APIXABAN 5 MG PO TABS
5.0000 mg | ORAL_TABLET | Freq: Two times a day (BID) | ORAL | Status: DC
  Administered 2018-02-14 – 2018-02-16 (×5): 5 mg via ORAL
  Filled 2018-02-14 (×5): qty 1

## 2018-02-14 MED ORDER — DIVALPROEX SODIUM ER 250 MG PO TB24
250.0000 mg | ORAL_TABLET | Freq: Every day | ORAL | Status: DC
  Administered 2018-02-14 – 2018-02-15 (×2): 250 mg via ORAL
  Filled 2018-02-14 (×3): qty 1

## 2018-02-14 MED ORDER — PREDNISONE 50 MG PO TABS
50.0000 mg | ORAL_TABLET | Freq: Every day | ORAL | Status: DC
  Administered 2018-02-15 – 2018-02-16 (×2): 50 mg via ORAL
  Filled 2018-02-14 (×3): qty 1

## 2018-02-14 NOTE — Progress Notes (Signed)
Occupational Therapy Evaluation Patient Details Name: Abigail Wallace MRN: [id number] DOB: May 26, 1945 Today's Date: 02/14/2018    History of Present Illness Abigail Wallace is a 73 y.o. female presenting with fatigue, URI symptoms found to have an AKI and mild COPD exacerbation . PMH is significant for paranoid schizophrenia, COPD, diastolic heart failure, HLD, gout, CKD-IIIb, afib, complete heart block.    Clinical Impression   PTA, pt reports she was living at home alone and received assistance from her son PRN. Pt reports she has not ambulated for 6 years and required assistance from her son for transferring to/from her wheelchair. Pt states her son visits daily to assist with ADL/IADL. Pt currently limited this session due to feeling nauseous with 1 episode of dry heaving and productive cough with some blood stain, nsg aware. VSS throughout session. Pt highly motivated to complete grooming task sitting EOB after setup A. Pt will benefit from continued OT services to maximize independence and safety with ADL/IADL completion. If pt has 24/7 assistance available feel pt will benefit from returning to home environment with [place]. If level of assistance is unavailable pt may benefit from SNF prior to return home. Will continue to follow acutely.     Follow Up Recommendations  Supervision/Assistance - 24 hour;Home health OT    Equipment Recommendations  3 in 1 bedside commode    Recommendations for Other Services PT consult     Precautions / Restrictions Precautions Precautions: Fall;ICD/Pacemaker Restrictions Weight Bearing Restrictions: No      Mobility Bed Mobility Overal bed mobility: Needs Assistance Bed Mobility: Supine to Sit     Supine to sit: Modified independent (Device/Increase time);HOB elevated     General bed mobility comments: pt progressed to upright position with increased effort and time;pt required mod-max A to progress rest of the way to EOB to support feet on  ground  Transfers Overall transfer level: Needs assistance               General transfer comment: pt declined OOB mobiltiy due to feeling nauseous    Balance                                           ADL either performed or assessed with clinical judgement   ADL Overall ADL's : Needs assistance/impaired Eating/Feeding: Set up;Sitting Eating/Feeding Details (indicate cue type and reason): pt eating breakfast upon arrival  Grooming: Brushing hair;Set up Grooming Details (indicate cue type and reason): pt completed grooming task while sitting EOB  Upper Body Bathing: Set up;Sitting   Lower Body Bathing: Moderate assistance;Sitting/lateral leans   Upper Body Dressing : Set up;Sitting   Lower Body Dressing: Maximal assistance;Sitting/lateral leans                 General ADL Comments: OOB mobility limited due to pt feeling nauseous      Vision         Perception     Praxis      Pertinent Vitals/Pain Pain Assessment: 0-10 Pain Score: 8  Pain Descriptors / Indicators: Discomfort;Grimacing;Moaning Pain Intervention(s): Limited activity within patient's tolerance;Monitored during session     Hand Dominance Right   Extremity/Trunk Assessment Upper Extremity Assessment Upper Extremity Assessment: Generalized weakness   Lower Extremity Assessment Lower Extremity Assessment: Defer to PT evaluation       Communication Communication Communication: No difficulties   Cognition Arousal/Alertness:  Awake/alert Behavior During Therapy: WFL for tasks assessed/performed Overall Cognitive Status: Within Functional Limits for tasks assessed                                 General Comments: pt highly motivated to participate during session despite feeling nauseaus    General Comments       Exercises     Shoulder Instructions      Home Living Family/patient expects to be discharged to:: Private residence Living Arrangements:  Alone Available Help at Discharge: Family;Available PRN/intermittently Type of Home: Apartment Home Access: Level entry     Home Layout: One level     Bathroom Shower/Tub: Teacher, early years/pre: Standard Bathroom Accessibility: Yes How Accessible: Accessible via wheelchair Home Equipment: Grab bars - toilet;Grab bars - tub/shower;Hospital bed;Wheelchair - manual;Shower seat          Prior Functioning/Environment Level of Independence: Needs assistance  Gait / Transfers Assistance Needed: pt reports she has not ambulated for 6 years;uses wheelchair;has assistance from son to get into/out of bed PRN;requires assistance with transfer to wc ADL's / Homemaking Assistance Needed: son assists with ADL/IADL             OT Problem List: Decreased strength;Decreased range of motion;Impaired balance (sitting and/or standing);Decreased activity tolerance;Pain;Decreased knowledge of use of DME or AE      OT Treatment/Interventions: Self-care/ADL training;Therapeutic exercise;Energy conservation;DME and/or AE instruction;Therapeutic activities;Patient/family education;Balance training    OT Goals(Current goals can be found in the care plan section) Acute Rehab OT Goals Patient Stated Goal: to get stronger and feel better OT Goal Formulation: With patient Time For Goal Achievement: 02/28/18 Potential to Achieve Goals: Good  OT Frequency: Min 2X/week   Barriers to D/C: Decreased caregiver support  pt reports her son visits daily but she does not have 24/7 assistance       Co-evaluation              AM-PAC OT "6 Clicks" Daily Activity     Outcome Measure Help from another person eating meals?: None Help from another person taking care of personal grooming?: A Little Help from another person toileting, which includes using toliet, bedpan, or urinal?: Total Help from another person bathing (including washing, rinsing, drying)?: A Lot Help from another person to put  on and taking off regular upper body clothing?: A Little Help from another person to put on and taking off regular lower body clothing?: A Lot 6 Click Score: 15   End of Session Nurse Communication: Mobility status;Other (comment)(pt feeling nauseous with productive cough and blood present)  Activity Tolerance: Patient tolerated treatment well Patient left: in bed;with call bell/phone within reach;Other (comment)(with lab tech present)  OT Visit Diagnosis: Unsteadiness on feet (R26.81);Other abnormalities of gait and mobility (R26.89);Muscle weakness (generalized) (M62.81);Pain Pain - part of body: ("all over")                Time: 0938-1829 OT Time Calculation (min): 41 min Charges:  OT General Charges $OT Visit: 1 Visit OT Evaluation $OT Eval Moderate Complexity: 1 Mod OT Treatments $Self Care/Home Management : 23-37 mins  Dorinda Hill OTR/L Acute Rehabilitation Services Office: [place] 02/14/2018, 10:04 AM

## 2018-02-14 NOTE — Progress Notes (Signed)
CRITICAL VALUE ALERT  Critical Value:  Lactic Acid 2.7  Date & Time Notied:  12:10    MD paged

## 2018-02-14 NOTE — Care Management Note (Signed)
Case Management Note  Patient Details  Name: Abigail Wallace MRN: [id number] Date of Birth: 13-Aug-1945  Subjective/Objective:      AKI              Action/Plan: PTA home alone with [facility], PT-verified active status-[phone]. Noted that PT recommended snf. Pt is refusing snf stating she is "going home". Son lives close by and assists with adls, meds, meals, and transportation for medical appointments. Pt will need [place] orders for PT, OT, RN, SW, and Aide with Face to Face when ready to transition home. CM to follow for transition of care needs.    Expected Discharge Date:                  Expected Discharge Plan:  [place]  In-House Referral:  Clinical Social Work  Discharge planning Services  CM Consult  Post Acute Care Choice:  Home Health, Resumption of Svcs/PTA Provider([place]) Choice offered to:  Patient  DME Arranged:  N/A DME Agency:  NA  HH Arranged:  RN, PT, OT, Nurse's Aide, Social Work [employer] Agency:  Other - See comment([place])  Status of Service:  In process, will continue to follow  If discussed at Long Length of Stay Meetings, dates discussed:    Additional Comments:  Bartholomew Crews, RN 02/14/2018, 3:27 PM

## 2018-02-14 NOTE — Consult Note (Signed)
Renal Service Consult Note [place] 02/14/2018 [place] Requesting Physician:  Dr Ardelia Mems, B.   Reason for Consult:  Renal failure, met acidosis HPI: The patient is a 73 y.o. year-old w/ hx of psychosis, memory issues, DM2, tardive dyskinesia, HTN, gout, CAD, COPD, afib and PPM.  Presented to ED from SNF for abnormal labs per PCP.  Also low grade fevers.  In ED creat was 2.3 , rising lately per PCP. Felt to be dehydrated.  Admitted . We are asked to see for renal faliure.    Pt poor historain, pleasant, no CP, no SOB, +diarrhea lately not much details. No abd pain.  Lives at SNF.   ROS  denies CP  no joint pain   no HA  no blurry vision  no rash  no diarrhea  no nausea/ vomiting    Past Medical History  Past Medical History:  Diagnosis Date  . Atrial fibrillation ([place])   . COPD (chronic obstructive pulmonary disease) ([place])   . Coronary artery disease    NON-CRITICAL  . GERD (gastroesophageal reflux disease)   . Gout   . Hypertension   . Memory difficulties 12/02/2013  . Mental disorder   . Psychosis ([place])   . Tardive dyskinesia 12/02/2013  . Type 2 diabetes mellitus [facility])    Past Surgical History  Past Surgical History:  Procedure Laterality Date  . CARDIAC CATHETERIZATION  2007   Non-critical.   . CHOLECYSTECTOMY    . EP IMPLANTABLE DEVICE N/A 08/31/2014   Procedure: PPM Generator Changeout;  Surgeon: Sanda Klein, MD;  Location: [facility];  Service: Cardiovascular;  Laterality: N/A;  . INSERT / REPLACE / REMOVE PACEMAKER  2007   Temelec/SYMPTOMATIC HEART BLOCK  . PACEMAKER PLACEMENT  09/28/2005   2/2 SSS?  . Persantine stress test  05/01/2010   EF 66%. Normal LV sys fx. Unchanged from previous studies.   . TRANSTHORACIC ECHOCARDIOGRAM  09/08/10   SEVERE CONCENTRIC HYPERTROPHY.LV FUNCTION WAS VIGOROUS.EF 65%-70%.VENTRICULAR SEPTUM-INCOORDINATE MOTION.LEFT ATRIUM-MILDLY DILATED.TRIVIAL TR.  . TUBAL  LIGATION     Family History  Family History  Problem Relation Age of Onset  . Heart disease Mother   . Kidney disease Mother   . Heart disease Father   . Kidney disease Father   . Stroke Sister   . Diabetes type II Son    Social History  reports that she has been smoking cigarettes. She has been smoking about 0.25 packs per day. She has never used smokeless tobacco. She reports that she does not drink alcohol or use drugs. Allergies  Allergies  Allergen Reactions  . Abilify [Aripiprazole] Anaphylaxis, Shortness Of Breath and Other (See Comments)    Tremors  . Clozapine Anaphylaxis  . Benadryl [Diphenhydramine Hcl] Other (See Comments)    States affects her vision  . Cogentin [Benztropine] Other (See Comments)    Affects mobility  . Haloperidol Lactate Other (See Comments)    Vision changes, Shaking, Drooling  . Latuda [Lurasidone Hcl] Other (See Comments)    "Tremors and shakes."   . Remeron [Mirtazapine] Other (See Comments)    Tremors and shakes  . Codeine Other (See Comments)    Unknown   . Latex Rash   Home medications Prior to Admission medications   Medication Sig Start Date End Date Taking? Authorizing Provider  acetaminophen (TYLENOL) 500 MG tablet Take 1,000 mg by mouth at bedtime.    Yes [provider]  albuterol (PROVENTIL HFA;VENTOLIN HFA)  108 (90 Base) MCG/ACT inhaler Inhale 2 puffs into the lungs every 6 (six) hours as needed for wheezing or shortness of breath. 01/24/18  Yes Martyn Malay, MD  albuterol (PROVENTIL) (2.5 MG/3ML) 0.083% nebulizer solution Take 3 mLs (2.5 mg total) by nebulization every 6 (six) hours as needed for wheezing or shortness of breath. 02/12/18  Yes Martyn Malay, MD  allopurinol (ZYLOPRIM) 100 MG tablet TAKE 1 TABLET (100 MG TOTAL) BY MOUTH DAILY. 08/01/17  Yes Croitoru, Mihai, MD  amLODipine (NORVASC) 10 MG tablet Take 1 tablet (10 mg total) by mouth daily. 04/17/17  Yes [place] Bing, DO  apixaban (ELIQUIS) 5 MG TABS  tablet Take 1 tablet (5 mg total) by mouth 2 (two) times daily. 02/05/18  Yes Croitoru, Mihai, MD  diclofenac sodium (VOLTAREN) 1 % GEL Apply 4 g topically 4 (four) times daily as needed (joint pain). 06/07/17  Yes [place] Bing, DO  divalproex (DEPAKOTE ER) 250 MG 24 hr tablet Take 20 mg by mouth at bedtime. 02/11/18  Yes [provider]  glipiZIDE (GLUCOTROL) 5 MG tablet Take 1 tablet (5 mg total) by mouth daily before breakfast. 02/12/18  Yes Martyn Malay, MD  linagliptin (TRADJENTA) 5 MG TABS tablet Take 1 tablet (5 mg total) by mouth daily. 02/12/18  Yes Martyn Malay, MD  losartan (COZAAR) 100 MG tablet TAKE 1 TABLET BY MOUTH DAILY Patient taking differently: Take 100 mg by mouth daily.  08/05/17  Yes Croitoru, Mihai, MD  metoprolol tartrate (LOPRESSOR) 50 MG tablet TAKE 1 TABLET BY MOUTH TWICE A DAY Patient taking differently: Take 50 mg by mouth 2 (two) times daily.  08/01/17  Yes Croitoru, Mihai, MD  nitroGLYCERIN (NITROSTAT) 0.4 MG SL tablet Place 1 tablet (0.4 mg total) under the tongue every 5 (five) minutes x 3 doses as needed. For chest 11/13/16  Yes Croitoru, Mihai, MD  nystatin cream (MYCOSTATIN) Apply 1 application topically 2 (two) times daily. 04/20/16  Yes McKeag, Marylynn Pearson, MD  OLANZapine (ZYPREXA) 10 MG tablet Take 10 mg by mouth at bedtime. 06/08/14  Yes [provider]  pravastatin (PRAVACHOL) 40 MG tablet TAKE 1 TABLET BY MOUTH DAILY IN THE EVENING Patient taking differently: Take 40 mg by mouth every evening.  04/15/17  Yes Croitoru, Mihai, MD  QUEtiapine (SEROQUEL) 25 MG tablet Take 25 mg by mouth at bedtime.  06/08/14  Yes [provider]  umeclidinium bromide (INCRUSE ELLIPTA) 62.5 MCG/INH AEPB Inhale 1 puff into the lungs daily. 01/24/18  Yes Martyn Malay, MD  Blood Glucose Monitoring Suppl (ONETOUCH VERIO) w/Device KIT 1 Device by Does not apply route 3 (three) times a week. 02/12/18   Martyn Malay, MD  glucose blood test strip Test three time  weekly 02/12/18   Martyn Malay, MD  guaiFENesin (ROBITUSSIN) 100 MG/5ML SOLN Take 5 mLs (100 mg total) by mouth every 4 (four) hours as needed for cough or to loosen phlegm. Patient not taking: Reported on 02/14/2018 01/24/18   Martyn Malay, MD  hydrochlorothiazide (MICROZIDE) 12.5 MG capsule Take 12.5 mg by mouth daily.    [provider]  Incontinence Supplies MISC 1 Units by Does not apply route as needed. 12/13/15   McKeag, Marylynn Pearson, MD  Lancet Device MISC 1 Device by Does not apply route 3 (three) times a week. 02/12/18   Martyn Malay, MD   Liver Function Tests Recent Labs  Lab 02/13/18 1040  AST 22  ALT 14  ALKPHOS  74  BILITOT 0.5  PROT 6.8  ALBUMIN 3.5   No results for input(s): LIPASE, AMYLASE in the last 168 hours. CBC Recent Labs  Lab 02/13/18 1040 02/14/18 0651  WBC 12.2* 13.0*  NEUTROABS 7.6  --   HGB 10.1* 9.7*  HCT 33.3* 31.3*  MCV 89.8 88.4  PLT 271 166   Basic Metabolic Panel Recent Labs  Lab 02/12/18 1150 02/13/18 1040 02/14/18 0651  NA 137 135 138  K 5.2 4.3 3.8  CL 103 108 112*  CO2 10* 15* 15*  GLUCOSE 326* 290* 226*  BUN 49* 43* 40*  CREATININE 2.30* 2.26* 2.02*  CALCIUM 9.6 9.4 8.9   Iron/TIBC/Ferritin/ %Sat No results found for: IRON, TIBC, FERRITIN, IRONPCTSAT  Vitals:   02/14/18 0839 02/14/18 0842 02/14/18 0920 02/14/18 1619  BP:   (!) 107/96 (!) 168/71  Pulse:   90 76  Resp:    18  Temp:    98.2 F (36.8 C)  TempSrc:    Oral  SpO2: 97% 97% 96% 96%  Weight:      Height:       Exam Gen heavy older lady, no distress No rash, cyanosis or gangrene Sclera anicteric, throat clear and mouth on dry side No jvd or bruits Chest clear bilat to bases RRR no MRG Abd soft ntnd no mass or ascites +bs very obese large pannus GU w/ purewick in place draining clear yellow urine MS no joint effusions or deformity Ext no sig LE or UE edema, no wounds or ulcers Neuro is alert and pleasant   Ua clear, 100 prot 0-5 rbc/ epi/ wbc,  1.013 CXR mild IS edema   date  Creat  egfr  2012  0.6-0.9  2017  1.00  2018  1.20   2019  1.49  34  01/24/18 1.88  26  02/12/18 2.3  1/30  2.2  1/31 today 2.0   Na 138  K 3.8  BUN 40  CO2 15  CL 112  AG 11  BUN 40  Cr 2.02  Wbc 13  Hb 9.7   Assessment: 1. Acute on CKF 3/4 - baseline creat 1.4- 1.9, worsening renal function over the past few years.  Acute issues prob dehydration and ARB.   2. Met acidosis non AG - prob due to diarrhea 3. Vol depletion is dry on exam still 4. Dementia/ psychosis 5. DM2 6. Atrial fib  7. HTN - hold ARB for now, norvasc and metoprolol ok to use 8. PPM    P: 1. Renal US 2. Bicarb gtt at 100 /hr 3. F/u labs in am       Kelly Splinter MD [facility] pager [phone]   02/14/2018, 4:39 PM

## 2018-02-14 NOTE — Progress Notes (Signed)
CRITICAL VALUE ALERT  Lactic Acid:  2.2  MD paged and made aware with no further orders 1455

## 2018-02-14 NOTE — Progress Notes (Signed)
Family Medicine Teaching Service Daily Progress Note Intern Pager: [phone]  Patient name: Abigail Wallace Medical record number: [id number] Date of birth: 29-May-1945 Age: 73 y.o. Gender: female  Primary Care Provider: Martyn Malay, MD Consultants: nephrology Code Status: Full code  Pt Overview and Major Events to Date:  Hospital Day 0 Admitted: 02/13/2018 1/31 - nephro consulted  Assessment and Plan: Abigail Wallace is a 73 y.o. female presenting with fatigue, URI symptoms found to have an AKI and mild COPD exacerbation . PMH is significant for paranoid schizophrenia, COPD, diastolic heart failure, HLD, gout, CKD-IIIb, afib, complete heart block.   AKI on CKD-IIIb d/t dehydration-  Patient is s/p 2L NS boluses and on 177ml/hr mIVF. Morning bmp shows improving Creatinine (2.02). LA decreasing: 3.2>>2.2.  Patient has persisent metabolic acidosis.  Per Dr. Saul Fordyce clinic note on 1/29 she has an exophytic lesion on the lateral cortex of the R kidney that could be a hemorrhagic cyst or solid kidney lesion and will need eventual further workup with MRI or CT. - consult nephrology   - 140ml/hr NS mIVF - trend LA to normal - f/u AM BMP - vitals - carb controlled diet  COPD exacerbation - CXR negative. At home patient takes incruse ellipta and albuterol nebulizer. Lungs sound improved today. No wheezing on exam.  - continue home incruse ellipta - duonebs q6h - albuterol q2h PRN - incentive spirometry - pulse oximetry - holding azithro and prednisone given significant hyperglycemia recently and mild severity  Chest pain - resolved  Patient complaining of chest pain, non-reproducible on palpation.  Initial trop < 0.03. EKG consistent with known complete heart block and pacemaker.   - trend trops  Non-insulin dependant DM-II, uncontrolled - most recent cbgs: 178-219.  Taking glipizide and linagliptin at home - hold home meds  - lantus 10 U - sSSI  - CBG TID and qHS  Paroxysmal  A-Fib - episode of A-fib over one day in duration was recorded on patient's pacemaker, per cardiology note.  This was the first and only recorded episode of atrial fibrillation. On eliquis 5mg  BID at home.  - continous cardiac monitoring  - restarting eliquis  Complete Heart Block - patient has dual chambered Medtronic Adapta implanted in August 2016.  Recent office visit with her cardiologist states she has 100% ventricular pacing and 50% atrial pacing.  -  Continuous cardiac monitoring  Paranoid Schizophrenia - on quetiapine and olanzapine as well as depakote per recent office visit with Dr. [name], PCP. Depakote not listed in home meds - continue quetiapine and olanzapine - f/u depakote level  - restart depakote  Gout - on allopurinol at home. Not currently having a gout flare.  - holding allopurinol.    Fluids: 132ml/hr NS  Electrolytes: none  Nutrition: carb controlled GI ppx: none DVT px: lovenox  Disposition: home   Medications: Scheduled Meds: . amLODipine  10 mg Oral Daily  . enoxaparin (LOVENOX) injection  30 mg Subcutaneous Q24H  . insulin aspart  0-9 Units Subcutaneous TID WC  . insulin glargine  10 Units Subcutaneous QHS  . ipratropium-albuterol  3 mL Nebulization TID  . metoprolol tartrate  50 mg Oral BID  . OLANZapine  10 mg Oral QHS  . pravastatin  40 mg Oral QPM  . QUEtiapine  25 mg Oral QHS  . umeclidinium bromide  1 puff Inhalation Daily   Continuous Infusions: PRN Meds: acetaminophen **OR** acetaminophen, albuterol, polyethylene glycol  ================================================= ================================================= Subjective:  Patient says her stomach  hurts.  She has not had a bowel movement in 3 days and would like a laxative.  She says her chest pain is better. Son called during encounter and would like to know when the patient can go home.   Objective: Temp:  [98.2 F (36.8 C)-99.5 F (37.5 C)] 98.2 F (36.8 C) (01/31  0534) Pulse Rate:  [70-147] 80 (01/31 0534) Resp:  [12-20] 20 (01/31 0534) BP: (126-162)/(50-120) 158/69 (01/31 0534) SpO2:  [89 %-100 %] 97 % (01/31 0534) Weight:  [129.7 kg-131 kg] 131 kg (01/31 0100) Intake/Output 01/30 0701 - 01/31 0700 In: 2947.1 [P.O.:240; I.V.:1707.1; IV Piggyback:1000] Out: 600 [Urine:600] Physical Exam:  Gen: NAD, alert, non-toxic, well-nourished, well-appearing, sitting comfortably  HEENT: Normocephaic, atraumatic. Clear conjuctiva, no scleral icterus and injection.  CV: Regular rate and rhythm.  adial pulses 2+ bilaterally. No bilateral lower extremity edema. Resp: Clear to auscultation bilaterally.  No wheezing, rales, abnormal lung sounds.  Abd: mildly to palpation.  Active bowel sounds.  Psych: Cooperative with exam. Pleasant. Makes eye contact. Extremities: Full ROM    Laboratory: Recent Labs  Lab 02/13/18 1040  WBC 12.2*  HGB 10.1*  HCT 33.3*  PLT 271   Recent Labs  Lab 02/12/18 1150 02/13/18 1040  NA 137 135  K 5.2 4.3  CL 103 108  CO2 10* 15*  BUN 49* 43*  CREATININE 2.30* 2.26*  CALCIUM 9.6 9.4  PROT  --  6.8  BILITOT  --  0.5  ALKPHOS  --  74  ALT  --  14  AST  --  22  GLUCOSE 326* 290*    Imaging/Diagnostic Tests: Dg Chest 2 View  Result Date: 02/13/2018 CLINICAL DATA:  Chest pain and shortness of breath for few days EXAM: CHEST - 2 VIEW COMPARISON:  January 15, 2017 FINDINGS: The heart size is enlarged. Cardiac pacemaker is unchanged. Mediastinal contour is normal. There is mild increased interstitial markings in both lungs. No focal pneumonia or pleural effusion is identified. The bony structures are stable. IMPRESSION: Mild interstitial edema.  Cardiomegaly. Electronically Signed   By: Abelardo Diesel M.D.   On: 02/13/2018 11:31      Benay Pike, MD 02/14/2018, 6:18 AM PGY-1, [place] Intern pager: [phone], text pages welcome

## 2018-02-14 NOTE — Evaluation (Signed)
Physical Therapy Evaluation Patient Details Name: Abigail Wallace MRN: [id number] DOB: 05-19-1945 Today's Date: 02/14/2018   History of Present Illness  Abigail Wallace is a 73 y.o. female presenting with fatigue, URI symptoms found to have an AKI and mild COPD exacerbation . PMH is significant for paranoid schizophrenia, COPD, diastolic heart failure, HLD, gout, CKD-IIIb, afib, complete heart block.   Clinical Impression   Patient received in bed, very pleasant and willing to participate in PT session today. She did require ModAx2 to come to EOB safely however, and attempted to perform lateral scooting transfer before therapist had chair ready, requiring Max intervention to prevent her scooting herself off of the side of the bed. Once therapist had chair ready, again attempted lateral scooting transfer however patient attempted to scoot by grabbing onto railing at EOB and pushing/forcing her hips to the side, thus again starting to push herself off of the edge of the bed instead of laterally into chair and again requiring Max intervention from PT to prevent slide to floor. She was fatigued at this point and required ModAx2 to return to supine, MinAx2 to scoot up in bed. She was left in bed with all needs met, bed alarm active. Strongly recommend ST-SNF due to very poor safety awareness and gross weakness and deconditioning, patient refusing however. If she continues to decline SNF and returns home, she will require 24/7 physical assist/S, also consider hoyer lift to improve safety of transfers.     Follow Up Recommendations SNF    Equipment Recommendations  Other (comment)(defer to next venue )    Recommendations for Other Services       Precautions / Restrictions Precautions Precautions: Fall;ICD/Pacemaker Restrictions Weight Bearing Restrictions: No      Mobility  Bed Mobility Overal bed mobility: Needs Assistance Bed Mobility: Supine to Sit;Rolling Rolling: Mod assist   Supine to  sit: Mod assist;+2 for physical assistance     General bed mobility comments: ModAx2 for safe transition to EOB due to weakness and poor safety awareness   Transfers                 General transfer comment: attempted lateral scoot transfer, patient very unsafe and trying to scoot herself over by reaching towards end of bed, required Max intervention and cues  from PT to prevent sliding off edge of bed to floor   Ambulation/Gait             General Gait Details: non-ambulatory at baseline   Stairs            Wheelchair Mobility    Modified Rankin (Stroke Patients Only)       Balance Overall balance assessment: Needs assistance Sitting-balance support: Bilateral upper extremity supported;Feet supported Sitting balance-Leahy Scale: Good       Standing balance-Leahy Scale: Poor                               Pertinent Vitals/Pain Pain Assessment: 0-10 Pain Score: 2  Pain Location: head  Pain Descriptors / Indicators: Aching Pain Intervention(s): Limited activity within patient's tolerance;Monitored during session    Home Living Family/patient expects to be discharged to:: Private residence Living Arrangements: Alone Available Help at Discharge: Family;Available PRN/intermittently Type of Home: Apartment Home Access: Level entry     Home Layout: One level Home Equipment: Grab bars - toilet;Grab bars - tub/shower;Hospital bed;Wheelchair - manual;Shower seat      Prior Function Level  of Independence: Needs assistance   Gait / Transfers Assistance Needed: pt reports she has not ambulated for 6 years;uses wheelchair;has assistance from son to get into/out of bed PRN;requires assistance with transfer to wc  ADL's / Homemaking Assistance Needed: son assists with ADL/IADL         Hand Dominance   Dominant Hand: Right    Extremity/Trunk Assessment   Upper Extremity Assessment Upper Extremity Assessment: Generalized weakness     Lower Extremity Assessment Lower Extremity Assessment: Generalized weakness    Cervical / Trunk Assessment Cervical / Trunk Assessment: Kyphotic  Communication   Communication: No difficulties  Cognition Arousal/Alertness: Awake/alert Behavior During Therapy: WFL for tasks assessed/performed Overall Cognitive Status: Within Functional Limits for tasks assessed                                 General Comments: very highly motivated to participate       General Comments      Exercises     Assessment/Plan    PT Assessment Patient needs continued PT services  PT Problem List Decreased strength;Decreased coordination;Decreased activity tolerance;Decreased knowledge of use of DME;Decreased balance;Decreased safety awareness;Decreased mobility;Decreased knowledge of precautions       PT Treatment Interventions DME instruction;Therapeutic exercise;Gait training;Balance training;Stair training;Neuromuscular re-education;Cognitive remediation;Functional mobility training;Therapeutic activities;Patient/family education    PT Goals (Current goals can be found in the Care Plan section)  Acute Rehab PT Goals Patient Stated Goal: go home  PT Goal Formulation: With patient Time For Goal Achievement: 02/28/18 Potential to Achieve Goals: Fair    Frequency Min 3X/week   Barriers to discharge        Co-evaluation               AM-PAC PT "6 Clicks" Mobility  Outcome Measure Help needed turning from your back to your side while in a flat bed without using bedrails?: A Lot Help needed moving from lying on your back to sitting on the side of a flat bed without using bedrails?: A Lot Help needed moving to and from a bed to a chair (including a wheelchair)?: A Lot Help needed standing up from a chair using your arms (e.g., wheelchair or bedside chair)?: Total Help needed to walk in hospital room?: Total   6 Click Score: 8    End of Session   Activity Tolerance:  Patient limited by fatigue Patient left: in bed;with bed alarm set;with call bell/phone within reach   PT Visit Diagnosis: Muscle weakness (generalized) (M62.81);Other abnormalities of gait and mobility (R26.89)    Time: 1320-1350 PT Time Calculation (min) (ACUTE ONLY): 30 min   Charges:   PT Evaluation $PT Eval Low Complexity: 1 Low PT Treatments $Self Care/Home Management: 8-22        Deniece Ree PT, DPT, CBIS  Supplemental Physical Therapist [place]    Pager [phone] Acute Rehab Office [phone]

## 2018-02-15 LAB — GLUCOSE, CAPILLARY
Glucose-Capillary: 205 mg/dL — ABNORMAL HIGH (ref 70–99)
Glucose-Capillary: 270 mg/dL — ABNORMAL HIGH (ref 70–99)
Glucose-Capillary: 384 mg/dL — ABNORMAL HIGH (ref 70–99)
Glucose-Capillary: 437 mg/dL — ABNORMAL HIGH (ref 70–99)
Glucose-Capillary: 358 mg/dL — ABNORMAL HIGH (ref 70–99)

## 2018-02-15 LAB — IRON AND TIBC
Iron: 36 ug/dL (ref 28–170)
Saturation Ratios: 12 % (ref 10.4–31.8)
TIBC: 298 ug/dL (ref 250–450)
UIBC: 262 ug/dL

## 2018-02-15 LAB — BASIC METABOLIC PANEL
Anion gap: 13 (ref 5–15)
BUN: 31 mg/dL — ABNORMAL HIGH (ref 8–23)
CO2: 15 mmol/L — ABNORMAL LOW (ref 22–32)
CREATININE: 1.78 mg/dL — AB (ref 0.44–1.00)
Calcium: 8.8 mg/dL — ABNORMAL LOW (ref 8.9–10.3)
Chloride: 112 mmol/L — ABNORMAL HIGH (ref 98–111)
GFR calc non Af Amer: 27 mL/min — ABNORMAL LOW (ref >60)
GFR, EST AFRICAN AMERICAN: 32 mL/min — AB (ref >60)
Glucose, Bld: 172 mg/dL — ABNORMAL HIGH (ref 70–99)
Potassium: 3.5 mmol/L (ref 3.5–5.1)
Sodium: 140 mmol/L (ref 135–145)

## 2018-02-15 LAB — FERRITIN: Ferritin: 186 ng/mL (ref 11–307)

## 2018-02-15 MED ORDER — INSULIN GLARGINE 100 UNIT/ML ~~LOC~~ SOLN
15.0000 [IU] | Freq: Every day | SUBCUTANEOUS | Status: DC
  Administered 2018-02-15: 15 [IU] via SUBCUTANEOUS
  Filled 2018-02-15 (×2): qty 0.15

## 2018-02-15 MED ORDER — INSULIN ASPART 100 UNIT/ML ~~LOC~~ SOLN
0.0000 [IU] | Freq: Three times a day (TID) | SUBCUTANEOUS | Status: DC
  Administered 2018-02-16 (×2): 11 [IU] via SUBCUTANEOUS

## 2018-02-15 MED ORDER — DICLOFENAC SODIUM 1 % TD GEL
4.0000 g | Freq: Four times a day (QID) | TRANSDERMAL | Status: DC | PRN
  Administered 2018-02-15: 4 g via TOPICAL
  Filled 2018-02-15: qty 100

## 2018-02-15 MED ORDER — IPRATROPIUM-ALBUTEROL 0.5-2.5 (3) MG/3ML IN SOLN
3.0000 mL | Freq: Four times a day (QID) | RESPIRATORY_TRACT | Status: DC | PRN
  Administered 2018-02-15: 3 mL via RESPIRATORY_TRACT
  Filled 2018-02-15: qty 3

## 2018-02-15 MED ORDER — POTASSIUM CHLORIDE CRYS ER 20 MEQ PO TBCR
20.0000 meq | EXTENDED_RELEASE_TABLET | Freq: Two times a day (BID) | ORAL | Status: AC
  Administered 2018-02-15 (×2): 20 meq via ORAL
  Filled 2018-02-15 (×2): qty 1

## 2018-02-15 MED ORDER — SODIUM BICARBONATE 650 MG PO TABS
650.0000 mg | ORAL_TABLET | Freq: Two times a day (BID) | ORAL | Status: DC
  Administered 2018-02-15 – 2018-02-16 (×3): 650 mg via ORAL
  Filled 2018-02-15 (×3): qty 1

## 2018-02-15 MED ORDER — INSULIN ASPART 100 UNIT/ML ~~LOC~~ SOLN
10.0000 [IU] | Freq: Once | SUBCUTANEOUS | Status: AC
  Administered 2018-02-15: 10 [IU] via SUBCUTANEOUS

## 2018-02-15 NOTE — Progress Notes (Signed)
Occupational Therapy Treatment Patient Details Name: Abigail Wallace MRN: [id number] DOB: 11/11/1945 Today's Date: 02/15/2018    History of present illness Louise Rawson is a 73 y.o. female presenting with fatigue, URI symptoms found to have an AKI and mild COPD exacerbation . PMH is significant for paranoid schizophrenia, COPD, diastolic heart failure, HLD, gout, CKD-IIIb, afib, complete heart block.    OT comments  Pt sitting in recliner upon arrival, per nursing transferred with [facility], prior to OT arrival. Session this date focused on use of AE to assist with LB dressing and toileting hygiene. Pt required maxA to return demonstrate use of AE. Pt demonstrated decreased awareness into her current level of functioning, stating she "could not use AE since [place] (her son) assists with ADL. Pt demonstrates functional limitations as listed below (see OT problem list) that limit her independence and safety with ADL and functional mobility. Pt will benefit from continued therapy at SNF to progress level of functioning prior to return home. If pt continues to decline SNF she will require 24/7 assistance and HHOT. Will continue to follow acutely.    Follow Up Recommendations  SNF    Equipment Recommendations  3 in 1 bedside commode    Recommendations for Other Services PT consult    Precautions / Restrictions Precautions Precautions: Fall;ICD/Pacemaker Restrictions Weight Bearing Restrictions: No       Mobility Bed Mobility               General bed mobility comments: pt sitting in recliner upon arrival   Transfers                 General transfer comment: per nurse, use of Stedy to transfer pt to recliner prior to arrival    Balance                                           ADL either performed or assessed with clinical judgement   ADL Overall ADL's : Needs assistance/impaired                     Lower Body Dressing: With adaptive  equipment;Maximal assistance Lower Body Dressing Details (indicate cue type and reason): pt required maxA to return demonstrate use of AE to assist with LB dressing               General ADL Comments: decreased awareness of deficits and pt's current level of functioning, stated she "couldn't use long handled sponge because [place] (son) does itAudiological scientist      Cognition Arousal/Alertness: Awake/alert Behavior During Therapy: WFL for tasks assessed/performed Overall Cognitive Status: Impaired/Different from baseline Area of Impairment: Attention;Following commands;Safety/judgement                   Current Attention Level: Sustained   Following Commands: Follows multi-step commands with increased time Safety/Judgement: Decreased awareness of deficits     General Comments: pt demonstrates decreased awareness of deficits, stating that she wasn't able to use AE because her son assists with ADL. Pt had 1 short episode of crying during session, unable to state reason for crying. pt very tangential during ADL         Exercises     Shoulder Instructions       General Comments  Pertinent Vitals/ Pain       Pain Assessment: No/denies pain Pain Intervention(s): Monitored during session  Home Living                                          Prior Functioning/Environment              Frequency  Min 2X/week        Progress Toward Goals  OT Goals(current goals can now be found in the care plan section)  Progress towards OT goals: Progressing toward goals  Acute Rehab OT Goals Patient Stated Goal: to go home OT Goal Formulation: With patient Time For Goal Achievement: 02/28/18 Potential to Achieve Goals: Good ADL Goals Pt Will Perform Lower Body Dressing: with modified independence;with adaptive equipment Pt Will Transfer to Toilet: with min assist Additional ADL Goal #1: Pt will demonstrate use o 3  energy conservation strategies with ADL completion. Additional ADL Goal #2: Pt's family will demonstrate safe transfers 3x during session.  Plan Discharge plan needs to be updated    Co-evaluation                 AM-PAC OT "6 Clicks" Daily Activity     Outcome Measure   Help from another person eating meals?: None Help from another person taking care of personal grooming?: A Little Help from another person toileting, which includes using toliet, bedpan, or urinal?: Total Help from another person bathing (including washing, rinsing, drying)?: A Lot Help from another person to put on and taking off regular upper body clothing?: A Little Help from another person to put on and taking off regular lower body clothing?: A Lot 6 Click Score: 15    End of Session    OT Visit Diagnosis: Unsteadiness on feet (R26.81);Other abnormalities of gait and mobility (R26.89);Muscle weakness (generalized) (M62.81);Pain   Activity Tolerance Patient tolerated treatment well   Patient Left in chair;with call bell/phone within reach;with chair alarm set   Nurse Communication Mobility status        Time: 8882-8003 OT Time Calculation (min): 17 min  Charges: OT Treatments $Self Care/Home Management : 8-22 mins  Dorinda Hill OTR/L Acute Rehabilitation Services Office: [place] 02/15/2018, 12:53 PM

## 2018-02-15 NOTE — Discharge Summary (Addendum)
[facility] Discharge Summary  Patient name: Abigail Wallace Medical record number: [id number] Date of birth: 24-Feb-1945 Age: 73 y.o. Gender: female Date of Admission: 02/13/2018  Date of Discharge: 02/16/2018 Admitting Physician: Leeanne Rio, MD  Primary Care Provider: Martyn Malay, MD Consultants: none  Indication for Hospitalization: AKI, metabolic acidosis, worsening kidney function  Discharge Diagnoses/Problem List:  AKI CKD COPD Metabolic acidosis DM-II Chest pain  Paroxysmal afib Complete heart block Gout  Disposition: home with home health  Discharge Condition: stable, improved  Discharge Exam:  BP (!) 174/68 (BP Location: Left Arm)   Pulse 87   Temp 98.1 F (36.7 C) (Oral)   Resp 20   Ht '5\' 6"'  (1.676 m)   Wt 133.9 kg   SpO2 100%   BMI 47.65 kg/m   Gen: NAD, alert and oriented, lying in bed comfortably CV: RRR, no MRG, no pedal edema Resp: CTAB without wheezing or crackles, intermittently tachypneic but appears comfortable Abd: soft, obese, nontender to palpation Psych: normal mood and affect  Brief Hospital Course:  Patient was told to come in by her PCP after her lab work from recent visit indicated that she was in danger of going into DKA given her low bicarb, elevated blood sugars, and worsening Creatinine.  In the ED the patient's lab values had slightly improved. She did not appear to be in any distress, but her kidney function was still indicating AKI, likely from dehydration, so she was given fluid and her LA and Creatinine were followed for improvement. Nephrology was consulted and they recommended bicarbonate supplementation and renal ultrasound, which showed medical renal disease.    Chest pain - patient complained of chest pain but had negative troponins x 3 and the chest pain resolved the next day.  No further workup was done.   COPD - wheezing was discovered on exam and patient had been complaining of  shortness of breath, but did not appear to be in respiratory distress. It was believed she was having a mild COPD exacerbation and she was treated with the standard breathing treatments, but antibiotics and prednisone were held initially. On 1/31 a 5 day course of prednisone was initiated.  She never required supplemental oxygen, and she felt well enough to be discharged home on 2/2.  The prednisone did cause an elevation in patient's glucose from the mid-100s to the mid-400s, so patient's insulin was increased.  However, since she does not take insulin at home, she will likely have elevated glucose through 2/4, which is her last day of prednisone.  Issues for Follow Up:  1. F/u renal ultrasound in 4 months  2. Patient's glucose increased significantly (up to 435) after prednisone was added, so please check her glucose. 3. Oral bicarb was initiated while inpatient; patient can continue this as a chronic medication. 4. Check a CBC to monitor patient's hemoglobin, which downtrended from 10.1 to 9.2 while in the hospital.  Ferritin, Iron, and TIBC were wnl. 5. ARB held at discharge due to AKI. Please check renal function at follow up, if improved consider restarting ARB if indicated. Amlodopine was started in its place.   Significant Procedures: none  Significant Labs and Imaging:  Recent Labs  Lab 02/13/18 1040 02/14/18 0651 02/16/18 0530  WBC 12.2* 13.0* 14.0*  HGB 10.1* 9.7* 9.2*  HCT 33.3* 31.3* 28.7*  PLT 271 255 242   Recent Labs  Lab 02/12/18 1150 02/13/18 1040 02/14/18 0651 02/15/18 0432 02/16/18 0530  NA 137 135  138 140 140  K 5.2 4.3 3.8 3.5 3.5  CL 103 108 112* 112* 105  CO2 10* 15* 15* 15* 23  GLUCOSE 326* 290* 226* 172* 288*  BUN 49* 43* 40* 31* 28*  CREATININE 2.30* 2.26* 2.02* 1.78* 1.84*  CALCIUM 9.6 9.4 8.9 8.8* 8.8*  PHOS  --   --   --   --  2.4*  ALKPHOS  --  74  --   --   --   AST  --  22  --   --   --   ALT  --  14  --   --   --   ALBUMIN  --  3.5  --   --   2.9*     Results/Tests Pending at Time of Discharge:   Discharge Medications:  Allergies as of 02/16/2018      Reactions   Abilify [aripiprazole] Anaphylaxis, Shortness Of Breath, Other (See Comments)   Tremors   Clozapine Anaphylaxis   Benadryl [diphenhydramine Hcl] Other (See Comments)   States affects her vision   Cogentin [benztropine] Other (See Comments)   Affects mobility   Haloperidol Lactate Other (See Comments)   Vision changes, Shaking, Drooling   Latuda [lurasidone Hcl] Other (See Comments)   "Tremors and shakes."    Remeron [mirtazapine] Other (See Comments)   Tremors and shakes   Codeine Other (See Comments)   Unknown    Latex Rash      Medication List    STOP taking these medications   guaiFENesin 100 MG/5ML Soln Commonly known as:  ROBITUSSIN   losartan 100 MG tablet Commonly known as:  COZAAR     TAKE these medications   acetaminophen 500 MG tablet Commonly known as:  TYLENOL Take 1,000 mg by mouth at bedtime.   albuterol 108 (90 Base) MCG/ACT inhaler Commonly known as:  PROVENTIL HFA;VENTOLIN HFA Inhale 2 puffs into the lungs every 6 (six) hours as needed for wheezing or shortness of breath.   albuterol (2.5 MG/3ML) 0.083% nebulizer solution Commonly known as:  PROVENTIL Take 3 mLs (2.5 mg total) by nebulization every 6 (six) hours as needed for wheezing or shortness of breath.   allopurinol 100 MG tablet Commonly known as:  ZYLOPRIM TAKE 1 TABLET (100 MG TOTAL) BY MOUTH DAILY.   amLODipine 10 MG tablet Commonly known as:  NORVASC Take 1 tablet (10 mg total) by mouth daily.   apixaban 5 MG Tabs tablet Commonly known as:  ELIQUIS Take 1 tablet (5 mg total) by mouth 2 (two) times daily.   diclofenac sodium 1 % Gel Commonly known as:  VOLTAREN Apply 4 g topically 4 (four) times daily as needed (joint pain).   divalproex 250 MG 24 hr tablet Commonly known as:  DEPAKOTE ER Take 20 mg by mouth at bedtime.   glipiZIDE 5 MG  tablet Commonly known as:  GLUCOTROL Take 1 tablet (5 mg total) by mouth daily before breakfast.   glucose blood test strip Test three time weekly   hydrochlorothiazide 12.5 MG capsule Commonly known as:  MICROZIDE Take 12.5 mg by mouth daily.   Incontinence Supplies Misc 1 Units by Does not apply route as needed.   Lancet Device Misc 1 Device by Does not apply route 3 (three) times a week.   linagliptin 5 MG Tabs tablet Commonly known as:  TRADJENTA Take 1 tablet (5 mg total) by mouth daily.   metoprolol tartrate 50 MG tablet Commonly known as:  LOPRESSOR TAKE 1 TABLET  BY MOUTH TWICE A DAY   nitroGLYCERIN 0.4 MG SL tablet Commonly known as:  NITROSTAT Place 1 tablet (0.4 mg total) under the tongue every 5 (five) minutes x 3 doses as needed. For chest   nystatin cream Commonly known as:  MYCOSTATIN Apply 1 application topically 2 (two) times daily.   OLANZapine 10 MG tablet Commonly known as:  ZYPREXA Take 10 mg by mouth at bedtime.   ONETOUCH VERIO w/Device Kit 1 Device by Does not apply route 3 (three) times a week.   pravastatin 40 MG tablet Commonly known as:  PRAVACHOL TAKE 1 TABLET BY MOUTH DAILY IN THE EVENING   predniSONE 50 MG tablet Commonly known as:  DELTASONE Take one tablet daily. Start taking on:  February 17, 2018   QUEtiapine 25 MG tablet Commonly known as:  SEROQUEL Take 25 mg by mouth at bedtime.   sodium bicarbonate 650 MG tablet Take 1 tablet (650 mg total) by mouth 2 (two) times daily.   umeclidinium bromide 62.5 MCG/INH Aepb Commonly known as:  INCRUSE ELLIPTA Inhale 1 puff into the lungs daily.       Discharge Instructions: Please refer to Patient Instructions section of EMR for full details.  Patient was counseled important signs and symptoms that should prompt return to medical care, changes in medications, dietary instructions, activity restrictions, and follow up appointments.   Follow-Up Appointments: Follow-up Information     Martyn Malay, MD. Go on 02/18/2018.   Specialty:  Family Medicine Why:  Please go to your appointment on Tuesday, February 4 at 10:00AM. Contact information: [place] [address] [phone]           Benay Pike, MD 02/16/2018, 9:29 PM PGY-1, [place]

## 2018-02-15 NOTE — Progress Notes (Signed)
Patient ID: Abigail Wallace, female   DOB: [birth year], 73 y.o.   MRN: [id number] [facility] Progress Note   Assessment/ Plan:   1. Acute kidney Injury on chronic kidney disease stage IIIb/IV: Baseline creatinine appears to be around 1.5-1.9.  With decent urine output overnight and some improvement of creatinine.  I recommend continuing isotonic sodium bicarbonate for another 24 hours and will give oral potassium anticipating a further drop of her potassium with shifting. 2.  Mixed anion gap/non-gap metabolic acidosis: Likely from acute kidney injury and chronic diarrhea.  Will supplement intravenous sodium bicarbonate with some oral sodium bicarbonate which can be continued as an outpatient upon discharge. 3.  Anemia: Downtrending hemoglobin/hematocrit with volume repletion.  Will check iron studies. 4.  Dementia/psychosis: Appears stable on Seroquel, Depakote and Zyprexa.  Lives independently and son assists.   5.  Hypertension: Blood pressures marginally elevated.  Would recommend indefinite stoppage of hydrochlorothiazide and close reevaluation of labs while on ARB.  Subjective:   Reports to be feeling fair, denies any chest pain or shortness of breath.   Objective:   BP (!) 140/46   Pulse 78   Temp 98.5 F (36.9 C) (Oral)   Resp 16   Ht 5\' 6"  (1.676 m)   Wt 131 kg   SpO2 95%   BMI 46.61 kg/m   Intake/Output Summary (Last 24 hours) at 02/15/2018 0827 Last data filed at 02/15/2018 0600 Gross per 24 hour  Intake 2556.24 ml  Output 1730 ml  Net 826.24 ml   Weight change:   Physical Exam: Gen: Appears to be comfortable resting in bed CVS: Pulse regular rhythm, normal rate, S1 and S2 normal Resp: Clear to auscultation, no distinct rales or rhonchi Abd: Soft, obese, nontender Ext: Left ankle 1+ edema, no edema right ankle.  Imaging: Dg Chest 2 View  Result Date: 02/13/2018 CLINICAL DATA:  Chest pain and shortness of breath for few days EXAM: CHEST - 2 VIEW  COMPARISON:  January 15, 2017 FINDINGS: The heart size is enlarged. Cardiac pacemaker is unchanged. Mediastinal contour is normal. There is mild increased interstitial markings in both lungs. No focal pneumonia or pleural effusion is identified. The bony structures are stable. IMPRESSION: Mild interstitial edema.  Cardiomegaly. Electronically Signed   By: Abelardo Diesel M.D.   On: 02/13/2018 11:31   US Renal  Result Date: 02/14/2018 CLINICAL DATA:  Chronic kidney disease, history COPD, coronary artery disease, hypertension, type II diabetes mellitus EXAM: RENAL / URINARY TRACT ULTRASOUND COMPLETE COMPARISON:  CT abdomen and pelvis 10/02/2017 FINDINGS: Right Kidney: Renal measurements: 12.3 x 5.6 x 5.3 cm = volume: 188 mL. Normal cortical thickness and echogenicity. Small exophytic nodule mid RIGHT kidney 2.8 x 1.6 x 2.1 cm, complicated by scattered diffuse internal echoes, question small complicated cyst. This measured a maximum of 2.4 cm diameter on the prior CT. Additional small cyst at inferior pole 1.8 x 1.8 x 1.5 cm. No hydronephrosis or shadowing calcification. Left Kidney: Renal measurements: 13.5 x 7.2 x 6.3 cm = volume: 316 mL. Normal cortical thickness. Increased cortical echogenicity. Small exophytic cyst at upper pole 11 x 12 x 14 mm. Additional small exophytic nodule at mid LEFT kidney 11 x 9 x 12 mm, question complicated cyst; this measured 10 mm greatest size on prior CT. No additional mass or hydronephrosis. Bladder: Decompressed by Foley catheter, not visualized or evaluated. IMPRESSION: Suspected medical renal disease changes of at least the LEFT kidney. Small exophytic nodules at both kidneys, several of  which appear to represent cysts, additional of which likely represent complicated cysts. Since these appear minimally larger than on the previous CT exam recommend follow-up ultrasound in 4 months to assess stability. Patient has a pacemaker and is not a candidate for MR characterization.  Electronically Signed   By: Lavonia Dana M.D.   On: 02/14/2018 17:12    Labs: BMET Recent Labs  Lab 02/12/18 1150 02/13/18 1040 02/14/18 0651 02/15/18 0432  NA 137 135 138 140  K 5.2 4.3 3.8 3.5  CL 103 108 112* 112*  CO2 10* 15* 15* 15*  GLUCOSE 326* 290* 226* 172*  BUN 49* 43* 40* 31*  CREATININE 2.30* 2.26* 2.02* 1.78*  CALCIUM 9.6 9.4 8.9 8.8*   CBC Recent Labs  Lab 02/13/18 1040 02/14/18 0651  WBC 12.2* 13.0*  NEUTROABS 7.6  --   HGB 10.1* 9.7*  HCT 33.3* 31.3*  MCV 89.8 88.4  PLT 271 255    Medications:    . amLODipine  10 mg Oral Daily  . apixaban  5 mg Oral BID  . divalproex  250 mg Oral QHS  . insulin aspart  0-9 Units Subcutaneous TID WC  . insulin glargine  10 Units Subcutaneous QHS  . ipratropium-albuterol  3 mL Nebulization TID  . metoprolol tartrate  50 mg Oral BID  . OLANZapine  10 mg Oral QHS  . pravastatin  40 mg Oral QPM  . predniSONE  50 mg Oral Q breakfast  . QUEtiapine  25 mg Oral QHS  . umeclidinium bromide  1 puff Inhalation Daily   Elmarie Shiley, MD 02/15/2018, 8:27 AM

## 2018-02-15 NOTE — Progress Notes (Signed)
Family Medicine Teaching Service Daily Progress Note Intern Pager: [phone]  Patient name: Abigail Wallace Medical record number: [id number] Date of birth: 1945/04/24 Age: 73 y.o. Gender: female  Primary Care Provider: Martyn Malay, MD Consultants: nephrology Code Status: Full code  Pt Overview and Major Events to Date:  1/30-admitted 1/31 - nephro consulted, started on prednisone  Assessment and Plan: Abigail Wallace is a 73 y.o. female presenting with fatigue, URI symptoms found to have an AKI and mild COPD exacerbation . PMH is significant for paranoid schizophrenia, COPD, diastolic heart failure, HLD, gout, CKD-IIIb, afib, complete heart block.   AKI on CKD-IIIb d/t dehydration, improving-  renal US showed medical renal disease of left kidney, small nodules on each kidney likely cysts and recommended f/u CT in 4 months. Creatinine up slightly from 1.78 to 1.84 today. - follow up nephrology recs, will await their recs before d/c - 142ml/hr bicarb mIVF and bicarb 650 mg PO BID - AM BMP - vitals - carb controlled diet - avoid nephrotoxic agents  COPD exacerbation, improving - CXR showed mild interstitial edema and cardiomegaly. home meds: incruse ellipta and albuterol nebulizer.  Satting in [place] on room air - duonebs q6h - continue incruse ellipta - albuterol q2h PRN - prednisone (1/31-2/4) - incentive spirometry - pulse oximetry  Non-insulin dependent DM-II, uncontrolled - CBGs increased sharply with addition of prednisone, so Lantus was increased to 15 units QHS, and short-acting was switched to resistant sliding scale.  Glucose has been up to 435 but has improved to the 200s with increasing her insulin requirement. - hold home meds - lantus 15 U - rSSI - CBG TID and qHS  Paroxysmal A-Fib - home meds: eliquis 5mg  BID. HR well controlled- <90 - continous cardiac monitoring  - continue eliquis - norvasc and lopressor  Complete Heart Block - patient has dual  chambered Medtronic Adapta implanted in August 2016.  Recent office visit with her cardiologist states she has 100% ventricular pacing and 50% atrial pacing.  - Continuous cardiac monitoring  Paranoid Schizophrenia - on quetiapine and olanzapine as well as depakote per recent office visit with Dr. [name], PCP. Depakote not listed in home meds - continue quetiapine and olanzapine - continue depakote  Gout - on allopurinol at home. Not currently having a gout flare.  - holding allopurinol.    FEN/GI:Fluids: 110ml/hr sodium bicarb  DVT px: eliquis  Disposition: [facility] with [facility] PT/OT/RN/SW on 2/2   Subjective:  Patient says she is comfortable and has no concerns.  She denies feeling short of breath.  She is ready to go home today and her son plans to pick her up.  Objective: Temp:  [98.4 F (36.9 C)-98.5 F (36.9 C)] 98.5 F (36.9 C) (02/01 2122) Pulse Rate:  [78-96] 96 (02/01 2122) Resp:  [16-22] 22 (02/01 2122) BP: (140-178)/(46-93) 141/93 (02/01 2122) SpO2:  [92 %-98 %] 95 % (02/01 2122) Weight:  [131.9 kg] 131.9 kg (02/01 2122) Intake/Output 01/31 0701 - 02/01 0700 In: 2556.2 [P.O.:940; I.V.:1116.2; IV Piggyback:500] Out: 7408 [Urine:1730] Physical Exam:  Gen: NAD, alert and oriented, lying in bed comfortably CV: RRR, no MRG, no pedal edema Resp: CTAB without wheezing or crackles, intermittently tachypneic but appears comfortable Abd: soft, obese, nontender to palpation Psych: normal mood and affect  Laboratory: Recent Labs  Lab 02/13/18 1040 02/14/18 0651  WBC 12.2* 13.0*  HGB 10.1* 9.7*  HCT 33.3* 31.3*  PLT 271 255   Recent Labs  Lab 02/13/18 1040 02/14/18 0651 02/15/18  0432  NA 135 138 140  K 4.3 3.8 3.5  CL 108 112* 112*  CO2 15* 15* 15*  BUN 43* 40* 31*  CREATININE 2.26* 2.02* 1.78*  CALCIUM 9.4 8.9 8.8*  PROT 6.8  --   --   BILITOT 0.5  --   --   ALKPHOS 74  --   --   ALT 14  --   --   AST 22  --   --   GLUCOSE 290* 226* 172*     Imaging/Diagnostic Tests: US Renal  Result Date: 02/14/2018 CLINICAL DATA:  Chronic kidney disease, history COPD, coronary artery disease, hypertension, type II diabetes mellitus EXAM: RENAL / URINARY TRACT ULTRASOUND COMPLETE COMPARISON:  CT abdomen and pelvis 10/02/2017 FINDINGS: Right Kidney: Renal measurements: 12.3 x 5.6 x 5.3 cm = volume: 188 mL. Normal cortical thickness and echogenicity. Small exophytic nodule mid RIGHT kidney 2.8 x 1.6 x 2.1 cm, complicated by scattered diffuse internal echoes, question small complicated cyst. This measured a maximum of 2.4 cm diameter on the prior CT. Additional small cyst at inferior pole 1.8 x 1.8 x 1.5 cm. No hydronephrosis or shadowing calcification. Left Kidney: Renal measurements: 13.5 x 7.2 x 6.3 cm = volume: 316 mL. Normal cortical thickness. Increased cortical echogenicity. Small exophytic cyst at upper pole 11 x 12 x 14 mm. Additional small exophytic nodule at mid LEFT kidney 11 x 9 x 12 mm, question complicated cyst; this measured 10 mm greatest size on prior CT. No additional mass or hydronephrosis. Bladder: Decompressed by Foley catheter, not visualized or evaluated. IMPRESSION: Suspected medical renal disease changes of at least the LEFT kidney. Small exophytic nodules at both kidneys, several of which appear to represent cysts, additional of which likely represent complicated cysts. Since these appear minimally larger than on the previous CT exam recommend follow-up ultrasound in 4 months to assess stability. Patient has a pacemaker and is not a candidate for MR characterization. Electronically Signed   By: Lavonia Dana M.D.   On: 02/14/2018 17:12      Kathrene Alu, MD 02/15/2018, 10:32 PM PGY-2, [place] Intern pager: [phone], text pages welcome

## 2018-02-15 NOTE — Progress Notes (Signed)
Family Medicine Teaching Service Daily Progress Note Intern Pager: [phone]  Patient name: Abigail Wallace Medical record number: [id number] Date of birth: October 22, 1945 Age: 73 y.o. Gender: female  Primary Care Provider: Martyn Malay, MD Consultants: nephrology Code Status: Full code  Pt Overview and Major Events to Date:  1/30-admitted 1/31 - nephro consulted  Assessment and Plan: Abigail Wallace is a 73 y.o. female presenting with fatigue, URI symptoms found to have an AKI and mild COPD exacerbation . PMH is significant for paranoid schizophrenia, COPD, diastolic heart failure, HLD, gout, CKD-IIIb, afib, complete heart block.   AKI on CKD-IIIb d/t dehydration-  renal US showed medical renal disease of left kidney, small nodules on each kidney likely cysts and recommended f/u CT in 4 months. Creatinine improved to 1.78 today. LA dropped to normal x2, no longer trending. - follow up nephrology recs - 170ml/hr bicarb mIVF - AM BMP - vitals - carb controlled diet - avoid nephrotoxic agents  COPD exacerbation - CXR showed mild interstitial edema and cardiomegaly. home meds: incruse ellipta and albuterol nebulizer. Lungs clear to auscultation bilaterally. - duonebs q6h - continue incruse ellipta - albuterol q2h PRN - prednisone (1/31-2/4) - incentive spirometry - pulse oximetry  Chest pain - resolved  Denies chest pain today Initial trop < 0.03. EKG consistent with known complete heart block and pacemaker.   - f/u cards outpatient  Non-insulin dependant DM-II, uncontrolled - most recent cbgs: 164-172. home meds: glipizide and linagliptin.  - hold home meds - lantus 10 U - sSSI - CBG TID and qHS  Paroxysmal A-Fib - home meds: eliquis 5mg  BID. HR well controlled- <90 - continous cardiac monitoring  - continue eliquis - norvasc and lopressor  Complete Heart Block - patient has dual chambered Medtronic Adapta implanted in August 2016.  Recent office visit with her  cardiologist states she has 100% ventricular pacing and 50% atrial pacing.  -  Continuous cardiac monitoring  Paranoid Schizophrenia - on quetiapine and olanzapine as well as depakote per recent office visit with Dr. [name], PCP. Depakote not listed in home meds - continue quetiapine and olanzapine - continue depakote  Gout - on allopurinol at home. Not currently having a gout flare.  - holding allopurinol.    FEN/GI:Fluids: 185ml/hr sodium bicarb  DVT px: eliquis  Disposition: recommend SNF, patient declined- home with [facility] PT/OT/RN/SW   Medications: Scheduled Meds: . amLODipine  10 mg Oral Daily  . apixaban  5 mg Oral BID  . divalproex  250 mg Oral QHS  . insulin aspart  0-9 Units Subcutaneous TID WC  . insulin glargine  10 Units Subcutaneous QHS  . ipratropium-albuterol  3 mL Nebulization TID  . metoprolol tartrate  50 mg Oral BID  . OLANZapine  10 mg Oral QHS  . pravastatin  40 mg Oral QPM  . predniSONE  50 mg Oral Q breakfast  . QUEtiapine  25 mg Oral QHS  . umeclidinium bromide  1 puff Inhalation Daily   Continuous Infusions: .  sodium bicarbonate (isotonic) infusion in sterile water 100 mL/hr at 02/14/18 1730   PRN Meds: acetaminophen **OR** acetaminophen, albuterol, diclofenac sodium, polyethylene glycol  ================================================= ================================================= Subjective:  Patient very pleasant this morning. She states she slept well overnight. Her breathing has improved. She complains of RUQ abdominal pain and R knee pain which was improved with Voltaren gel and placement. She would like to go home tomorrow.   Objective: Temp:  [98.2 F (36.8 C)] 98.2 F (36.8 C) (  01/31 2125) Pulse Rate:  [76-90] 87 (01/31 2125) Resp:  [18-20] 20 (01/31 2125) BP: (107-168)/(66-96) 145/66 (01/31 2125) SpO2:  [96 %-98 %] 97 % (01/31 2125) Intake/Output 01/31 0701 - 02/01 0700 In: 1100 [P.O.:600; IV Piggyback:500] Out: 1730  [Urine:1730] Physical Exam:  Gen: NAD, alert, non-toxic, well-nourished, well-appearing, sitting comfortably  HEENT: Normocephaic, atraumatic. Clear conjuctiva, no scleral icterus and injection.  CV: Regular rate and rhythm. Radial pulses 2+ bilaterally. No bilateral LE edema. Resp: Clear to auscultation bilaterally.  No wheezing, rales, abnormal lung sounds.  Abd: soft, tender to palpation over RUQ/ribs.  Active bowel sounds.  Psych: Cooperative with exam. Pleasant. Makes eye contact. Extremities: Right knee negative for deformities or tenderness to palpation, no rashes or lesions   Laboratory: Recent Labs  Lab 02/13/18 1040 02/14/18 0651  WBC 12.2* 13.0*  HGB 10.1* 9.7*  HCT 33.3* 31.3*  PLT 271 255   Recent Labs  Lab 02/12/18 1150 02/13/18 1040 02/14/18 0651  NA 137 135 138  K 5.2 4.3 3.8  CL 103 108 112*  CO2 10* 15* 15*  BUN 49* 43* 40*  CREATININE 2.30* 2.26* 2.02*  CALCIUM 9.6 9.4 8.9  PROT  --  6.8  --   BILITOT  --  0.5  --   ALKPHOS  --  74  --   ALT  --  14  --   AST  --  22  --   GLUCOSE 326* 290* 226*    Imaging/Diagnostic Tests: Dg Chest 2 View  Result Date: 02/13/2018 CLINICAL DATA:  Chest pain and shortness of breath for few days EXAM: CHEST - 2 VIEW COMPARISON:  January 15, 2017 FINDINGS: The heart size is enlarged. Cardiac pacemaker is unchanged. Mediastinal contour is normal. There is mild increased interstitial markings in both lungs. No focal pneumonia or pleural effusion is identified. The bony structures are stable. IMPRESSION: Mild interstitial edema.  Cardiomegaly. Electronically Signed   By: Abelardo Diesel M.D.   On: 02/13/2018 11:31   US Renal  Result Date: 02/14/2018 CLINICAL DATA:  Chronic kidney disease, history COPD, coronary artery disease, hypertension, type II diabetes mellitus EXAM: RENAL / URINARY TRACT ULTRASOUND COMPLETE COMPARISON:  CT abdomen and pelvis 10/02/2017 FINDINGS: Right Kidney: Renal measurements: 12.3 x 5.6 x 5.3 cm =  volume: 188 mL. Normal cortical thickness and echogenicity. Small exophytic nodule mid RIGHT kidney 2.8 x 1.6 x 2.1 cm, complicated by scattered diffuse internal echoes, question small complicated cyst. This measured a maximum of 2.4 cm diameter on the prior CT. Additional small cyst at inferior pole 1.8 x 1.8 x 1.5 cm. No hydronephrosis or shadowing calcification. Left Kidney: Renal measurements: 13.5 x 7.2 x 6.3 cm = volume: 316 mL. Normal cortical thickness. Increased cortical echogenicity. Small exophytic cyst at upper pole 11 x 12 x 14 mm. Additional small exophytic nodule at mid LEFT kidney 11 x 9 x 12 mm, question complicated cyst; this measured 10 mm greatest size on prior CT. No additional mass or hydronephrosis. Bladder: Decompressed by Foley catheter, not visualized or evaluated. IMPRESSION: Suspected medical renal disease changes of at least the LEFT kidney. Small exophytic nodules at both kidneys, several of which appear to represent cysts, additional of which likely represent complicated cysts. Since these appear minimally larger than on the previous CT exam recommend follow-up ultrasound in 4 months to assess stability. Patient has a pacemaker and is not a candidate for MR characterization. Electronically Signed   By: Lavonia Dana M.D.   On:  02/14/2018 17:12      Richarda Osmond, DO 02/15/2018, 1:28 AM PGY-1, [place] Intern pager: [phone], text pages welcome

## 2018-02-15 NOTE — Progress Notes (Signed)
Notified internal med pager that pt's BP 178/67 and no PRN- they called back stated that the BP was ok for pt and to monitor BP.   Paulla Fore, RN

## 2018-02-16 ENCOUNTER — Other Ambulatory Visit: Payer: Self-pay

## 2018-02-16 LAB — RENAL FUNCTION PANEL
ALBUMIN: 2.9 g/dL — AB (ref 3.5–5.0)
Anion gap: 12 (ref 5–15)
BUN: 28 mg/dL — ABNORMAL HIGH (ref 8–23)
CALCIUM: 8.8 mg/dL — AB (ref 8.9–10.3)
CO2: 23 mmol/L (ref 22–32)
Chloride: 105 mmol/L (ref 98–111)
Creatinine, Ser: 1.84 mg/dL — ABNORMAL HIGH (ref 0.44–1.00)
GFR calc Af Amer: 31 mL/min — ABNORMAL LOW (ref >60)
GFR calc non Af Amer: 26 mL/min — ABNORMAL LOW (ref >60)
Glucose, Bld: 288 mg/dL — ABNORMAL HIGH (ref 70–99)
Phosphorus: 2.4 mg/dL — ABNORMAL LOW (ref 2.5–4.6)
Potassium: 3.5 mmol/L (ref 3.5–5.1)
Sodium: 140 mmol/L (ref 135–145)

## 2018-02-16 LAB — CBC
HCT: 28.7 % — ABNORMAL LOW (ref 36.0–46.0)
Hemoglobin: 9.2 g/dL — ABNORMAL LOW (ref 12.0–15.0)
MCH: 27.7 pg (ref 26.0–34.0)
MCHC: 32.1 g/dL (ref 30.0–36.0)
MCV: 86.4 fL (ref 80.0–100.0)
PLATELETS: 242 10*3/uL (ref 150–400)
RBC: 3.32 MIL/uL — ABNORMAL LOW (ref 3.87–5.11)
RDW: 14.8 % (ref 11.5–15.5)
WBC: 14 10*3/uL — ABNORMAL HIGH (ref 4.0–10.5)
nRBC: 0 % (ref 0.0–0.2)

## 2018-02-16 LAB — GLUCOSE, CAPILLARY
Glucose-Capillary: 306 mg/dL — ABNORMAL HIGH (ref 70–99)
Glucose-Capillary: 274 mg/dL — ABNORMAL HIGH (ref 70–99)
Glucose-Capillary: 274 mg/dL — ABNORMAL HIGH (ref 70–99)

## 2018-02-16 MED ORDER — SODIUM BICARBONATE 650 MG PO TABS: 650.0000 mg | ORAL_TABLET | Freq: Two times a day (BID) | ORAL | 0 refills | Status: DC

## 2018-02-16 MED ORDER — PREDNISONE 50 MG PO TABS: ORAL_TABLET | ORAL | 0 refills | Status: DC

## 2018-02-16 NOTE — Care Management (Signed)
Notified Rudene Christians that patient will resume HH and has labs as part of order. Spoke w nurse who sates she does not need 3/1.

## 2018-02-16 NOTE — Progress Notes (Signed)
Patient ID: Abigail Wallace, female   DOB: [birth year], 73 y.o.   MRN: [id number] S: No new complaints O:BP (!) 174/68 (BP Location: Left Arm)   Pulse 87   Temp 98.1 F (36.7 C) (Oral)   Resp 20   Ht 5\' 6"  (1.676 m)   Wt 133.9 kg   SpO2 100%   BMI 47.65 kg/m   Intake/Output Summary (Last 24 hours) at 02/16/2018 0958 Last data filed at 02/16/2018 0800 Gross per 24 hour  Intake 2720 ml  Output 2875 ml  Net -155 ml   Intake/Output: I/O last 3 completed shifts: In: 3922.1 [P.O.:1840; I.V.:2082.1] Out: 3105 [Urine:3105]  Intake/Output this shift:  Total I/O In: 120 [P.O.:120] Out: 550 [Urine:550] Weight change:  Gen: obese WF in NAD CVS: no rub Resp: cta Abd: +BS, soft, NT Ext: no edema  Recent Labs  Lab 02/12/18 1150 02/13/18 1040 02/14/18 0651 02/15/18 0432 02/16/18 0530  NA 137 135 138 140 140  K 5.2 4.3 3.8 3.5 3.5  CL 103 108 112* 112* 105  CO2 10* 15* 15* 15* 23  GLUCOSE 326* 290* 226* 172* 288*  BUN 49* 43* 40* 31* 28*  CREATININE 2.30* 2.26* 2.02* 1.78* 1.84*  ALBUMIN  --  3.5  --   --  2.9*  CALCIUM 9.6 9.4 8.9 8.8* 8.8*  PHOS  --   --   --   --  2.4*  AST  --  22  --   --   --   ALT  --  14  --   --   --    Liver Function Tests: Recent Labs  Lab 02/13/18 1040 02/16/18 0530  AST 22  --   ALT 14  --   ALKPHOS 74  --   BILITOT 0.5  --   PROT 6.8  --   ALBUMIN 3.5 2.9*   No results for input(s): LIPASE, AMYLASE in the last 168 hours. No results for input(s): AMMONIA in the last 168 hours. CBC: Recent Labs  Lab 02/13/18 1040 02/14/18 0651 02/16/18 0530  WBC 12.2* 13.0* 14.0*  NEUTROABS 7.6  --   --   HGB 10.1* 9.7* 9.2*  HCT 33.3* 31.3* 28.7*  MCV 89.8 88.4 86.4  PLT 271 255 242   Cardiac Enzymes: Recent Labs  Lab 02/13/18 2225 02/14/18 0651 02/14/18 1223  TROPONINI <0.03 <0.03 <0.03   CBG: Recent Labs  Lab 02/15/18 1620 02/15/18 2124 02/15/18 2255 02/16/18 0218 02/16/18 0717  GLUCAP 384* 437* 358* 306* 274*    Iron  Studies:  Recent Labs    02/15/18 0919  IRON 36  TIBC 298  FERRITIN 186   Studies/Results: US Renal  Result Date: 02/14/2018 CLINICAL DATA:  Chronic kidney disease, history COPD, coronary artery disease, hypertension, type II diabetes mellitus EXAM: RENAL / URINARY TRACT ULTRASOUND COMPLETE COMPARISON:  CT abdomen and pelvis 10/02/2017 FINDINGS: Right Kidney: Renal measurements: 12.3 x 5.6 x 5.3 cm = volume: 188 mL. Normal cortical thickness and echogenicity. Small exophytic nodule mid RIGHT kidney 2.8 x 1.6 x 2.1 cm, complicated by scattered diffuse internal echoes, question small complicated cyst. This measured a maximum of 2.4 cm diameter on the prior CT. Additional small cyst at inferior pole 1.8 x 1.8 x 1.5 cm. No hydronephrosis or shadowing calcification. Left Kidney: Renal measurements: 13.5 x 7.2 x 6.3 cm = volume: 316 mL. Normal cortical thickness. Increased cortical echogenicity. Small exophytic cyst at upper pole 11 x 12 x 14 mm. Additional small exophytic nodule  at mid LEFT kidney 11 x 9 x 12 mm, question complicated cyst; this measured 10 mm greatest size on prior CT. No additional mass or hydronephrosis. Bladder: Decompressed by Foley catheter, not visualized or evaluated. IMPRESSION: Suspected medical renal disease changes of at least the LEFT kidney. Small exophytic nodules at both kidneys, several of which appear to represent cysts, additional of which likely represent complicated cysts. Since these appear minimally larger than on the previous CT exam recommend follow-up ultrasound in 4 months to assess stability. Patient has a pacemaker and is not a candidate for MR characterization. Electronically Signed   By: Lavonia Dana M.D.   On: 02/14/2018 17:12   . amLODipine  10 mg Oral Daily  . apixaban  5 mg Oral BID  . divalproex  250 mg Oral QHS  . insulin aspart  0-20 Units Subcutaneous TID WC  . insulin glargine  15 Units Subcutaneous QHS  . metoprolol tartrate  50 mg Oral BID  .  OLANZapine  10 mg Oral QHS  . pravastatin  40 mg Oral QPM  . predniSONE  50 mg Oral Q breakfast  . QUEtiapine  25 mg Oral QHS  . sodium bicarbonate  650 mg Oral BID  . umeclidinium bromide  1 puff Inhalation Daily    BMET    Component Value Date/Time   NA 140 02/16/2018 0530   NA 137 02/12/2018 1150   K 3.5 02/16/2018 0530   CL 105 02/16/2018 0530   CO2 23 02/16/2018 0530   GLUCOSE 288 (H) 02/16/2018 0530   BUN 28 (H) 02/16/2018 0530   BUN 49 (H) 02/12/2018 1150   CREATININE 1.84 (H) 02/16/2018 0530   CREATININE 1.03 (H) 04/15/2015 1444   CALCIUM 8.8 (L) 02/16/2018 0530   GFRNONAA 26 (L) 02/16/2018 0530   GFRNONAA 54 (L) 04/15/2015 1444   GFRAA 31 (L) 02/16/2018 0530   GFRAA 63 04/15/2015 1444   CBC    Component Value Date/Time   WBC 14.0 (H) 02/16/2018 0530   RBC 3.32 (L) 02/16/2018 0530   HGB 9.2 (L) 02/16/2018 0530   HGB 11.8 09/11/2016 1642   HCT 28.7 (L) 02/16/2018 0530   HCT 36.7 09/11/2016 1642   PLT 242 02/16/2018 0530   PLT 305 09/11/2016 1642   MCV 86.4 02/16/2018 0530   MCV 88 09/11/2016 1642   MCH 27.7 02/16/2018 0530   MCHC 32.1 02/16/2018 0530   RDW 14.8 02/16/2018 0530   RDW 14.4 09/11/2016 1642   LYMPHSABS 3.4 02/13/2018 1040   LYMPHSABS 4.7 (H) 09/11/2016 1642   MONOABS 0.6 02/13/2018 1040   EOSABS 0.3 02/13/2018 1040   EOSABS 0.3 09/11/2016 1642   BASOSABS 0.1 02/13/2018 1040   BASOSABS 0.1 09/11/2016 1642     Assessment/Plan:  1. AKI/CKD stage 3- in setting of diarrhea and volume depletion and concomitant ARB therapy.   1. Improving with IVF's and holding ARB 2. Continue to follow I's/o's and daily Scr 2. Metabolic acidosis- due to #1, improved with isotonic bicarb 1. Ok to stop isotonic bicarb IV and change to NS and continue with po bicarb 650mg  bid 3. Diarrhea/volume depletion- improved with ivf 4. DM type 2 5. Atrial fib- on eliquis 6. HTN- elevated off of arb but would not resume as risks outweigh benefits.  Amlodipine added for  BP. 7. COPD exacerbation- on prednisone per primary svc 8. Dementia/psychosis 9. Disposition- per primary svc  Donetta Potts, MD [facility] [phone]

## 2018-02-16 NOTE — Progress Notes (Signed)
Patient discharged to home. Patient AVS reviewed and signed. Patient capable re-verbalizing medications and follow-up appointments. IV removed. Patient belongings sent with patient. Patient educated to return to the ED in the event of SOB, chest pain or dizziness.   Jaythen Hamme B. RN 

## 2018-02-17 ENCOUNTER — Telehealth: Payer: Self-pay | Admitting: Family Medicine

## 2018-02-17 DIAGNOSIS — J449 Chronic obstructive pulmonary disease, unspecified: Secondary | ICD-10-CM

## 2018-02-17 DIAGNOSIS — E1165 Type 2 diabetes mellitus with hyperglycemia: Secondary | ICD-10-CM

## 2018-02-17 MED ORDER — QUETIAPINE FUMARATE 50 MG PO TABS: 50.0000 mg | ORAL_TABLET | Freq: Every day | ORAL | 3 refills | Status: DC

## 2018-02-17 MED ORDER — LANCET DEVICE MISC: 1.0000 | 11 refills | Status: DC

## 2018-02-17 MED ORDER — GLUCOSE BLOOD VI STRP: ORAL_STRIP | 12 refills | Status: DC

## 2018-02-17 MED ORDER — ONETOUCH VERIO W/DEVICE KIT: 1.0000 | PACK | 1 refills | Status: DC

## 2018-02-17 NOTE — Telephone Encounter (Signed)
Spoke with son.   1. Reconciled medication list. He does not yet have glipizide, linagliptin, or sodium bicarbonate. Patient was discharged with prednisone. She has not diabetic medications at home.  Breathing at baseline, doing well. Recommended NOT taking prednisone given hyperglycemia.   2. Patient unable to make appointment this week. Nursing- please reach out to home health agency---patient will need labs this week-- CBC and BMET. She is using [facility].    3. Nursing- Please cancel appointment on 2/4 (no transportation) and 2/24 with me.  Please help patient schedule nutrition appointment with Dr. Jenne Campus. Referral placed. If the son needs to call her to schedule, please help facilitate this.   [name], MD  Family Medicine Teaching Service

## 2018-02-17 NOTE — Telephone Encounter (Signed)
Pt's son [name] is calling and would like for Dr. [name] to call him concerning the 3 f/u appointments that were scheduled from leaving the hospital. Pt son is not sure if all three are needed and would like to discuss.   The best call back number is [phone].

## 2018-02-17 NOTE — Telephone Encounter (Signed)
Hi Ma'am,  At your convenience would you please call this patient per Dr. [name] to arrange an appointment with you for nutrition counseling.  Her son [name] takes care of all her correspondence.  Thanks,  .Ozella Almond, CMA

## 2018-02-17 NOTE — Telephone Encounter (Signed)
Pt son would like Dr. [name] to give him a call back asap. Please advise

## 2018-02-18 ENCOUNTER — Telehealth: Payer: Self-pay | Admitting: Family Medicine

## 2018-02-18 ENCOUNTER — Encounter: Payer: Self-pay | Admitting: Family Medicine

## 2018-02-18 ENCOUNTER — Telehealth: Payer: Self-pay

## 2018-02-18 ENCOUNTER — Inpatient Hospital Stay: Payer: Medicare Other | Admitting: Family Medicine

## 2018-02-18 DIAGNOSIS — B91 Sequelae of poliomyelitis: Secondary | ICD-10-CM | POA: Diagnosis not present

## 2018-02-18 DIAGNOSIS — F200 Paranoid schizophrenia: Secondary | ICD-10-CM | POA: Diagnosis not present

## 2018-02-18 DIAGNOSIS — J449 Chronic obstructive pulmonary disease, unspecified: Secondary | ICD-10-CM | POA: Diagnosis not present

## 2018-02-18 DIAGNOSIS — M6281 Muscle weakness (generalized): Secondary | ICD-10-CM | POA: Diagnosis not present

## 2018-02-18 DIAGNOSIS — I480 Paroxysmal atrial fibrillation: Secondary | ICD-10-CM | POA: Diagnosis not present

## 2018-02-18 DIAGNOSIS — M1711 Unilateral primary osteoarthritis, right knee: Secondary | ICD-10-CM | POA: Diagnosis not present

## 2018-02-18 NOTE — Telephone Encounter (Signed)
Attempted to call patient's son [name] to clarify the medication that he is asking if it is ok for his mom to take.  There was no answer and no voicemail.  If patient's son calls back please let him know that if the medication in question is: Tradjenta Glipizide  Sodium bicarbonate   Per Dr. [name], they are ok to take.  Ozella Almond, [place]

## 2018-02-18 NOTE — Telephone Encounter (Signed)
Pt's son call and wants to know f the the pt would be ok to take the medication: [place], Glitizize, Sodium Bicarbonate  ( I'm sorry for the spelling) ad

## 2018-02-18 NOTE — Telephone Encounter (Signed)
Pts son called nurse line stating they are unable to pick up his mothers diabetic supplies. [facility] is requiring a physicians order diabetic form. I filled this out for patient and Dr. [name] signed. I faxed this to [facility]. I also made a copy for batch scanning.

## 2018-02-19 ENCOUNTER — Telehealth: Payer: Self-pay | Admitting: *Deleted

## 2018-02-19 NOTE — Telephone Encounter (Signed)
Nebulizer DME order sent to [facility] / Darlina Guys) via community message. Zarin Hagmann, [name], CMA

## 2018-02-19 NOTE — Telephone Encounter (Signed)
Received message from [facility], they will process now. Dorianna Mckiver, [name], CMA

## 2018-02-19 NOTE — Telephone Encounter (Signed)
Received documentation to sign from [facility] with incorrect medications.  Nursing- please see note dated 02/17/2018. Please call [facility] and request labs for this week (listed on 2/3 note). Please also fax them an updated medication list to review with the patient.   [name], MD  Family Medicine Teaching Service

## 2018-02-19 NOTE — Telephone Encounter (Signed)
Son called again.  [facility] still has not received.  I contacted [facility] Medicare processing and they verify that they have not received.  Found form in batch scanning and refaxed to original number and 2nd number provided by representative. Fleeger, [name], CMA

## 2018-02-19 NOTE — Telephone Encounter (Signed)
Called and spoke to [place] at [facility].   Informed her of the blood work needed to be performed at patient's home during visit. CBC and BMET per Dr. [name].  Faxed current medication list to [facility].  Abigail Wallace, [place]

## 2018-02-20 ENCOUNTER — Ambulatory Visit: Payer: Medicare Other | Admitting: Podiatry

## 2018-02-20 ENCOUNTER — Telehealth: Payer: Self-pay | Admitting: *Deleted

## 2018-02-20 ENCOUNTER — Telehealth: Payer: Self-pay

## 2018-02-20 DIAGNOSIS — J449 Chronic obstructive pulmonary disease, unspecified: Secondary | ICD-10-CM | POA: Diagnosis not present

## 2018-02-20 DIAGNOSIS — I480 Paroxysmal atrial fibrillation: Secondary | ICD-10-CM | POA: Diagnosis not present

## 2018-02-20 DIAGNOSIS — M1711 Unilateral primary osteoarthritis, right knee: Secondary | ICD-10-CM | POA: Diagnosis not present

## 2018-02-20 DIAGNOSIS — M6281 Muscle weakness (generalized): Secondary | ICD-10-CM | POA: Diagnosis not present

## 2018-02-20 DIAGNOSIS — B91 Sequelae of poliomyelitis: Secondary | ICD-10-CM | POA: Diagnosis not present

## 2018-02-20 DIAGNOSIS — F200 Paranoid schizophrenia: Secondary | ICD-10-CM | POA: Diagnosis not present

## 2018-02-20 NOTE — Telephone Encounter (Signed)
Abigail Wallace went out to do an OT eval today and pt has decline any additional help from OT at this time. Fleeger, [name], CMA

## 2018-02-20 NOTE — Telephone Encounter (Signed)
Almyra Free, case manager, called nurse line stating she needs verbal orders for skilled nursing:  2 week 4 1 week 2 2 PRN  Please call in VO to [place] [phone].  Almyra Free will draw BMET and CBC at visit

## 2018-02-20 NOTE — Telephone Encounter (Signed)
[facility] called stating they heard from Corporate, and the form we sent is the old form, therefore, not valid. Corporate [facility] is Corporate investment banker the new form, and [facility] will fax to [place]. I did inform the pharmacist we are closing today at 230p due to the weather. We will await fax.

## 2018-02-20 NOTE — Telephone Encounter (Signed)
Son called. [facility] has still not received. Spoke with [employer] at [employer]. She is emailing the representative from yesterday to see if form was received on alternate fax. She will call me back as soon as she hears. Gave her direct number to RN office. Son aware.  Danley Danker, RN [facility] RN)

## 2018-02-20 NOTE — Telephone Encounter (Signed)
Agree with below verbal orders. Called with Almyra Free.   [name], MD  Family Medicine Teaching Service

## 2018-02-21 ENCOUNTER — Telehealth: Payer: Self-pay | Admitting: Family Medicine

## 2018-02-21 ENCOUNTER — Telehealth: Payer: Self-pay

## 2018-02-21 DIAGNOSIS — M1711 Unilateral primary osteoarthritis, right knee: Secondary | ICD-10-CM | POA: Diagnosis not present

## 2018-02-21 DIAGNOSIS — M6281 Muscle weakness (generalized): Secondary | ICD-10-CM | POA: Diagnosis not present

## 2018-02-21 DIAGNOSIS — B91 Sequelae of poliomyelitis: Secondary | ICD-10-CM | POA: Diagnosis not present

## 2018-02-21 DIAGNOSIS — J449 Chronic obstructive pulmonary disease, unspecified: Secondary | ICD-10-CM | POA: Diagnosis not present

## 2018-02-21 DIAGNOSIS — F200 Paranoid schizophrenia: Secondary | ICD-10-CM | POA: Diagnosis not present

## 2018-02-21 DIAGNOSIS — N183 Chronic kidney disease, stage 3 (moderate): Secondary | ICD-10-CM | POA: Diagnosis not present

## 2018-02-21 DIAGNOSIS — I480 Paroxysmal atrial fibrillation: Secondary | ICD-10-CM | POA: Diagnosis not present

## 2018-02-21 LAB — CUP PACEART INCLINIC DEVICE CHECK
Date Time Interrogation Session: 20200207102237
Implantable Lead Implant Date: 20070414
Implantable Lead Implant Date: 20070914
Implantable Lead Location: 753859
Implantable Lead Location: 753860
Implantable Lead Model: 5594
Implantable Pulse Generator Implant Date: 20160816

## 2018-02-21 NOTE — Telephone Encounter (Signed)
Please call Kathlee Nations. Okay for below orders.   [name], MD  Family Medicine Teaching Service

## 2018-02-21 NOTE — Telephone Encounter (Signed)
Son called and would like to ask the doctor a couple of questions. One do we have an update on the forms for his mother medications? Also his mother was given medication for her diabetes in tablet for and now Dr. [name] would to start the insulin. He was wondering if it would be okay just for a few days to continue with the tablets to see if this will help. He knows it can take some time and would at like to try, unless this would be a bad choice for his mother. A home health nurse will be arriving today to show him how to administer the insulin. Also he was wondering if while his mom was in the hospital was she on insulin or tablets. Pt told the son that she wasn't given any insulin.Marland Kitchen Please call son to discuss these issues. jw

## 2018-02-21 NOTE — Telephone Encounter (Signed)
Called son and patient.  Confirmed patient's date of birth on phone.  Patient reports overall she feels well.  Denies continued diarrhea.  She is taking her medications as prescribed.  Per her son [name] her morning blood glucoses have been 180 and 182 the last 2 days.  She is taking her new medications as prescribed.  They have made considerable dietary changes.  They have a eliminated soda, sweets and juices.  Discussed that I did not recommend entirely eliminating foods.  I recommend taking these things into moderation.  The patient does enjoy [place] yogurt (plain) and likes sugar-free ice cream.  Discussed side effects of sugar-free foods including diarrhea.  Patient and son will call back early next week to follow-up regarding morning blood sugar checks.

## 2018-02-21 NOTE — Telephone Encounter (Signed)
Kathlee Nations, PT with [facility], called for verbal orders:  PT 2x/week x 3, 1x/week x 2 effective 2/10  Danley Danker, RN [facility] RN)

## 2018-02-21 NOTE — Telephone Encounter (Signed)
New form received, filled out, signed. Faxed to [facility] and the two [facility] numbers. Called [facility] to follow-up. Was told everything was good and went through and has been processed.  Danley Danker, RN [facility] RN)

## 2018-02-22 DIAGNOSIS — J449 Chronic obstructive pulmonary disease, unspecified: Secondary | ICD-10-CM | POA: Diagnosis not present

## 2018-02-22 DIAGNOSIS — B91 Sequelae of poliomyelitis: Secondary | ICD-10-CM | POA: Diagnosis not present

## 2018-02-22 DIAGNOSIS — M1711 Unilateral primary osteoarthritis, right knee: Secondary | ICD-10-CM | POA: Diagnosis not present

## 2018-02-22 DIAGNOSIS — F200 Paranoid schizophrenia: Secondary | ICD-10-CM | POA: Diagnosis not present

## 2018-02-22 DIAGNOSIS — I480 Paroxysmal atrial fibrillation: Secondary | ICD-10-CM | POA: Diagnosis not present

## 2018-02-22 DIAGNOSIS — M6281 Muscle weakness (generalized): Secondary | ICD-10-CM | POA: Diagnosis not present

## 2018-02-24 ENCOUNTER — Telehealth: Payer: Self-pay | Admitting: *Deleted

## 2018-02-24 ENCOUNTER — Telehealth: Payer: Self-pay

## 2018-02-24 DIAGNOSIS — E1165 Type 2 diabetes mellitus with hyperglycemia: Secondary | ICD-10-CM

## 2018-02-24 NOTE — Telephone Encounter (Signed)
There is not a Social worker at [employer].  Spoke to [place] the physical therapist at [place] and gave verbal authorization for PT.  Liji states that when she talked to patient's son he told her that he wanted them to go week to week.  Liji is not sure how compliant the patient will be since the son does not want PT as prescribed.  Liji wanted Dr. [name] to be aware that she will try her best with patient.  Ozella Almond, [place]

## 2018-02-24 NOTE — Telephone Encounter (Signed)
Pt son states that he is returning a call to Dr. [name]. Abigail Wallace, [name], CMA

## 2018-02-24 NOTE — Telephone Encounter (Signed)
While speaking to [place] from [place] today, she informed me that patient does have shingles. One cluster with white pustules.  Almyra Free wanted this information passed onto Dr. [name].  Ozella Almond, [place] ]

## 2018-02-25 DIAGNOSIS — I480 Paroxysmal atrial fibrillation: Secondary | ICD-10-CM | POA: Diagnosis not present

## 2018-02-25 DIAGNOSIS — M1711 Unilateral primary osteoarthritis, right knee: Secondary | ICD-10-CM | POA: Diagnosis not present

## 2018-02-25 DIAGNOSIS — B91 Sequelae of poliomyelitis: Secondary | ICD-10-CM | POA: Diagnosis not present

## 2018-02-25 DIAGNOSIS — F200 Paranoid schizophrenia: Secondary | ICD-10-CM | POA: Diagnosis not present

## 2018-02-25 DIAGNOSIS — J449 Chronic obstructive pulmonary disease, unspecified: Secondary | ICD-10-CM | POA: Diagnosis not present

## 2018-02-25 DIAGNOSIS — M6281 Muscle weakness (generalized): Secondary | ICD-10-CM | POA: Diagnosis not present

## 2018-02-25 MED ORDER — GLIPIZIDE 5 MG PO TABS: 5.0000 mg | ORAL_TABLET | Freq: Two times a day (BID) | ORAL | 3 refills | Status: DC

## 2018-02-25 NOTE — Telephone Encounter (Signed)
Called Abigail Wallace at [place] and asked her per Dr. [name] if it were possible to transmit a secure photo of patient's shingles as well as sending lab results.  Ozella Almond, [place]

## 2018-02-25 NOTE — Telephone Encounter (Signed)
Thanks [name].  Abigail Wallace

## 2018-02-25 NOTE — Telephone Encounter (Signed)
Almyra Free will send picture to my cell now (nothing to identify patient in picture)  I will show to Dr. [name] today. Ulyana Pitones, [name], CMA

## 2018-02-25 NOTE — Telephone Encounter (Signed)
Called patient and son.  Confirm with patient that I could speak with her son.  Reviewed blood glucose with the patient and her son.  Last week her blood glucoses have ranged from 1 60-250 in the morning.  4 PM blood glucoses have been around 190 and 180s.  She has had no hypoglycemia.  She has had no intolerance to her glipizide or DPP 4 inhibitor.  Recommended increasing the glipizide to twice a day 30 minutes before breakfast and 30 minutes before dinner.  Will consider going to long-acting form in future.  I have been avoiding this due to the risk for hypoglycemia my concern that if the patient does not eat she may become hypoglycemic.  Gradually on dietary changes.  Patient is now down to 2 cigarettes/day.  I congratulated her on this.  Patient reports some urinary symptoms.  These are vague.  She is eating and drinking well.  No vomiting or fevers. She has intermittent pain with urination approximately once a day.  Will obtain urine culture at follow-up.  Reviewed return precautions and ED precautions  Blood work returned.  The patient was given prednisone during her hospital stay her white blood cell count continues to be elevated at 16 with a differential showing some eosinophils and immature forms.  We will repeat this along with a smear.  She may require referral to hematology for further evaluation  Basic metabolic panel also returned this shows a creatinine of 1.65 which is significantly improved from hospital discharge.  No other significant electrolyte abnormalities.  Her bicarb continues to be slightly reduced.  She denies any further diarrhea.  She is taking her bicarbonate.  The patient also reports a new rash.  Is been present for 1 week.  It is intermittently itchy.  It also stings at times.  I have been sent a secure photograph of this rash.  It is erythematous with several small pustules over.  There appears to be excoriations present.  She denies pain, numbness, tingling or pain  consistent with herpes zoster.  Recommended discontinuing the nystatin powder she is applying.  We will monitor this will consider culturing this at her follow-up point.  [name], MD  Family Medicine Teaching Service

## 2018-02-25 NOTE — Telephone Encounter (Signed)
Please call home health nursing. If patient has rash consistent with shingles, she should be seen. If she cannot be seen, please ask Almyra Free to document a photo of the rash and transmit to [place] securely if possible. If neither of these options are possible, please let me know.   Additionally, please ask home health nursing to fax lab results.   [name], MD  Family Medicine Teaching Service

## 2018-02-27 ENCOUNTER — Ambulatory Visit: Payer: Medicare Other | Admitting: Podiatry

## 2018-02-27 DIAGNOSIS — M6281 Muscle weakness (generalized): Secondary | ICD-10-CM | POA: Diagnosis not present

## 2018-02-27 DIAGNOSIS — B91 Sequelae of poliomyelitis: Secondary | ICD-10-CM | POA: Diagnosis not present

## 2018-02-27 DIAGNOSIS — M1711 Unilateral primary osteoarthritis, right knee: Secondary | ICD-10-CM | POA: Diagnosis not present

## 2018-02-27 DIAGNOSIS — F200 Paranoid schizophrenia: Secondary | ICD-10-CM | POA: Diagnosis not present

## 2018-02-27 DIAGNOSIS — J449 Chronic obstructive pulmonary disease, unspecified: Secondary | ICD-10-CM | POA: Diagnosis not present

## 2018-02-27 DIAGNOSIS — I480 Paroxysmal atrial fibrillation: Secondary | ICD-10-CM | POA: Diagnosis not present

## 2018-02-28 DIAGNOSIS — M1711 Unilateral primary osteoarthritis, right knee: Secondary | ICD-10-CM | POA: Diagnosis not present

## 2018-02-28 DIAGNOSIS — I480 Paroxysmal atrial fibrillation: Secondary | ICD-10-CM | POA: Diagnosis not present

## 2018-02-28 DIAGNOSIS — B91 Sequelae of poliomyelitis: Secondary | ICD-10-CM | POA: Diagnosis not present

## 2018-02-28 DIAGNOSIS — F200 Paranoid schizophrenia: Secondary | ICD-10-CM | POA: Diagnosis not present

## 2018-02-28 DIAGNOSIS — J449 Chronic obstructive pulmonary disease, unspecified: Secondary | ICD-10-CM | POA: Diagnosis not present

## 2018-02-28 DIAGNOSIS — M6281 Muscle weakness (generalized): Secondary | ICD-10-CM | POA: Diagnosis not present

## 2018-03-02 ENCOUNTER — Encounter: Payer: Self-pay | Admitting: Family Medicine

## 2018-03-02 DIAGNOSIS — M47819 Spondylosis without myelopathy or radiculopathy, site unspecified: Secondary | ICD-10-CM | POA: Diagnosis not present

## 2018-03-02 DIAGNOSIS — I442 Atrioventricular block, complete: Secondary | ICD-10-CM | POA: Diagnosis not present

## 2018-03-02 DIAGNOSIS — I130 Hypertensive heart and chronic kidney disease with heart failure and stage 1 through stage 4 chronic kidney disease, or unspecified chronic kidney disease: Secondary | ICD-10-CM | POA: Diagnosis not present

## 2018-03-02 DIAGNOSIS — M6281 Muscle weakness (generalized): Secondary | ICD-10-CM | POA: Diagnosis not present

## 2018-03-02 DIAGNOSIS — G2401 Drug induced subacute dyskinesia: Secondary | ICD-10-CM | POA: Diagnosis not present

## 2018-03-02 DIAGNOSIS — Z6841 Body Mass Index (BMI) 40.0 and over, adult: Secondary | ICD-10-CM | POA: Diagnosis not present

## 2018-03-02 DIAGNOSIS — E669 Obesity, unspecified: Secondary | ICD-10-CM | POA: Diagnosis not present

## 2018-03-02 DIAGNOSIS — Z950 Presence of cardiac pacemaker: Secondary | ICD-10-CM | POA: Diagnosis not present

## 2018-03-02 DIAGNOSIS — F17200 Nicotine dependence, unspecified, uncomplicated: Secondary | ICD-10-CM | POA: Diagnosis not present

## 2018-03-02 DIAGNOSIS — E1122 Type 2 diabetes mellitus with diabetic chronic kidney disease: Secondary | ICD-10-CM | POA: Diagnosis not present

## 2018-03-02 DIAGNOSIS — M109 Gout, unspecified: Secondary | ICD-10-CM | POA: Diagnosis not present

## 2018-03-02 DIAGNOSIS — I5032 Chronic diastolic (congestive) heart failure: Secondary | ICD-10-CM | POA: Diagnosis not present

## 2018-03-02 DIAGNOSIS — F200 Paranoid schizophrenia: Secondary | ICD-10-CM | POA: Diagnosis not present

## 2018-03-02 DIAGNOSIS — J441 Chronic obstructive pulmonary disease with (acute) exacerbation: Secondary | ICD-10-CM | POA: Diagnosis not present

## 2018-03-02 DIAGNOSIS — F0390 Unspecified dementia without behavioral disturbance: Secondary | ICD-10-CM | POA: Diagnosis not present

## 2018-03-02 DIAGNOSIS — I480 Paroxysmal atrial fibrillation: Secondary | ICD-10-CM | POA: Diagnosis not present

## 2018-03-02 DIAGNOSIS — B91 Sequelae of poliomyelitis: Secondary | ICD-10-CM | POA: Diagnosis not present

## 2018-03-02 DIAGNOSIS — N183 Chronic kidney disease, stage 3 (moderate): Secondary | ICD-10-CM | POA: Diagnosis not present

## 2018-03-02 DIAGNOSIS — Z993 Dependence on wheelchair: Secondary | ICD-10-CM | POA: Diagnosis not present

## 2018-03-02 DIAGNOSIS — M1711 Unilateral primary osteoarthritis, right knee: Secondary | ICD-10-CM | POA: Diagnosis not present

## 2018-03-02 DIAGNOSIS — Z7901 Long term (current) use of anticoagulants: Secondary | ICD-10-CM | POA: Diagnosis not present

## 2018-03-02 DIAGNOSIS — E1165 Type 2 diabetes mellitus with hyperglycemia: Secondary | ICD-10-CM | POA: Diagnosis not present

## 2018-03-02 DIAGNOSIS — I2510 Atherosclerotic heart disease of native coronary artery without angina pectoris: Secondary | ICD-10-CM | POA: Diagnosis not present

## 2018-03-03 ENCOUNTER — Encounter: Payer: Self-pay | Admitting: Family Medicine

## 2018-03-03 ENCOUNTER — Other Ambulatory Visit: Payer: Self-pay

## 2018-03-03 ENCOUNTER — Ambulatory Visit (INDEPENDENT_AMBULATORY_CARE_PROVIDER_SITE_OTHER): Payer: Medicare Other | Admitting: Family Medicine

## 2018-03-03 ENCOUNTER — Ambulatory Visit (INDEPENDENT_AMBULATORY_CARE_PROVIDER_SITE_OTHER): Payer: Medicare Other

## 2018-03-03 VITALS — BP 138/68 | HR 78 | Temp 98.0°F | Ht 66.0 in | Wt 279.0 lb

## 2018-03-03 DIAGNOSIS — E1165 Type 2 diabetes mellitus with hyperglycemia: Secondary | ICD-10-CM | POA: Diagnosis not present

## 2018-03-03 DIAGNOSIS — R300 Dysuria: Secondary | ICD-10-CM | POA: Diagnosis not present

## 2018-03-03 DIAGNOSIS — N3000 Acute cystitis without hematuria: Secondary | ICD-10-CM | POA: Diagnosis not present

## 2018-03-03 DIAGNOSIS — L89311 Pressure ulcer of right buttock, stage 1: Secondary | ICD-10-CM

## 2018-03-03 DIAGNOSIS — D72825 Bandemia: Secondary | ICD-10-CM

## 2018-03-03 DIAGNOSIS — E039 Hypothyroidism, unspecified: Secondary | ICD-10-CM

## 2018-03-03 DIAGNOSIS — I10 Essential (primary) hypertension: Secondary | ICD-10-CM | POA: Diagnosis not present

## 2018-03-03 DIAGNOSIS — I495 Sick sinus syndrome: Secondary | ICD-10-CM

## 2018-03-03 DIAGNOSIS — D72829 Elevated white blood cell count, unspecified: Secondary | ICD-10-CM | POA: Insufficient documentation

## 2018-03-03 DIAGNOSIS — E038 Other specified hypothyroidism: Secondary | ICD-10-CM

## 2018-03-03 DIAGNOSIS — R918 Other nonspecific abnormal finding of lung field: Secondary | ICD-10-CM | POA: Insufficient documentation

## 2018-03-03 MED ORDER — GLUCOSE BLOOD VI STRP: ORAL_STRIP | 12 refills | Status: DC

## 2018-03-03 MED ORDER — CEPHALEXIN 500 MG PO CAPS: 500.0000 mg | ORAL_CAPSULE | Freq: Two times a day (BID) | ORAL | 0 refills | Status: DC

## 2018-03-03 NOTE — Assessment & Plan Note (Signed)
Repeat TSH in 3-6 months.

## 2018-03-03 NOTE — Progress Notes (Signed)
Patient Name: Abigail Wallace Date of Birth: November 26, 1945 Date of Visit: 03/03/18 PCP: Martyn Malay, MD  Chief Complaint:  Check in for medications, hospital follow up   Subjective: Abigail Wallace is a pleasant 73 y.o. with medical history significant for sick sinus syndrome, atrial fibrillation on Eliquis, obesity, tobacco abuse, hypertension, and type 2 diabetes with hyperglycemia presenting today with her son for hospital follow-up.  The patient was recently admitted for a metabolic acidosis related to hyperglycemia and acute kidney injury.  Reviewed her discharge summary personally.  She was also treated for COPD exacerbation and discharged on prednisone.  She did not complete this course due to significant hyperglycemia with this.  She was continued on her home hypoglycemic agents and started on oral bicarbonate.  The patient reports her diarrhea has entirely resolved.  She continues to take the oral bicarbonate.she had labs repeated with home health last week that demonstrated improvement in her creatinine although still mild non-gap acidosis.  She reports her breathing is entirely improved.  As discussed below.  She is not taking her losartan at this time.   Diabetes The patient and her son bring an extensive log of both her food intake and blood glucoses from the past 4 weeks.  Her morning blood glucoses range from 1 40-190.  There are no fasting blood glucose is over 200.  Her evening glucoses before dinnertime are between 180 and 220.  She denies signs or symptoms of hypoglycemia.  She denies polyuria or polydipsia.  She is taking glipizide twice a day now and Tradjenta once a day.  Her glipizide was increased last week.  She denies side effects from these medications.  She has cut down significantly on her juice and soda intake.  She was drinking between 6-8 sweetened beverages per day.  She is drinking entirely water at this point. In terms of her risk factor management the patient takes  Eliquis for atrial fibrillation and thus is not on aspirin.  She does take pravastatin.  She is not on an ACE or not at this time given her recent acute kidney injury.  Chronic kidney disease the patient sees nephrology every 6 months.  Dysuria The patient reports 5-day history of dysuria.  She reports some mild low back pain.  She denies fevers, chills, nausea, vomiting.  She denies hematuria.  Leukocytosis The patient has had a persistent leukocytosis since 2017.  She is a smoker.  She is down to 2 cigarettes/day.  As reviewed below she is not up-to-date on her cancer screening.  Normocytic anemia The patient had a notable finding of normocytic anemia on her most recent CBC.  She denies lightheadedness, dizziness, chest pain, worsening of her breathing.  She denies vaginal or rectal bleeding.  She reports her stools are Abigail Wallace in color.  Of note previous physicians have noted postmenopausal bleeding which I discussed with her on prior occasions.  Suspected COPD in the setting of tobacco abuse The patient is down to 2 cigarettes/day.  I congratulated her on this.  She is pre-contemplative about quitting.  She is intermittently using a vaping device. Albuterol use 1-2 times per day. She is using LAMA reguarly. No chest pain, worsening cough, fevers, sputum production.    ROS: As above  ROS  I have reviewed the patient's medical, surgical, family, and social history as appropriate.   Vitals:   03/03/18 1334  BP: 138/68  Pulse: 78  Temp: 98 F (36.7 C)  SpO2: 97%   Filed  Weights   03/03/18 1334  Weight: 279 lb (126.6 kg)   HEENT: Sclera anicteric. Dentition is absent. Appears well hydrated. Neck: Supple Cardiac: Regular rate and rhythm. Normal S1/S2. No murmurs, rubs, or gallops appreciated. Lungs: Clear bilaterally to ascultation. Markedly improved from prior  Abdomen: Normoactive bowel sounds. No tenderness to deep or light palpation. No rebound or guarding. Large hernia present    Extremities: Warm, well perfused without edema.  Skin: Warm, dry, small 0.5 cm erythematous macule on bottom  Psych: Pleasant and appropriate      Abigail Wallace was seen today for follow-up.  Diagnoses and all orders for this visit:  Bandemia uncertain etiology but malignancy must be considered.  I had a lengthy discussion with patient condition and her son today about routine colon cancer screening, lung cancer screening and further evaluation of her history of abnormal uterine bleeding.  The patient declined the above evaluation today.  Discussed risks of not undergoing this evaluation will continue to address this.  Differential also includes prednisone use, smoking use, chronic myelogenous leukemia given her concurrent normocytic anemia and significant elevation in her white blood cell count.  Repeat as below as this was after steroids. -     Pathologist smear review -     CBC with Differential  Type 2 diabetes mellitus with hyperglycemia, without long-term current use of insulin (HCC) markedly increased and improvement in control given dietary changes.  Congratulated patient on her dietary changes and small amount of weight loss.  We will continue glipizide at the increased dose and repeat A1c at follow-up -     glucose blood test strip; Test three time weekly  Dysuria patient has multiple risk factors and a history of pelvic organ prolapse.  Will obtain urine culture and prescription as below -     Urine Culture  Acute cystitis without hematuria -     cephALEXin (KEFLEX) 500 MG capsule; Take 1 capsule (500 mg total) by mouth 2 (two) times daily.  Pressure injury of right buttock, stage 1 this is improving recommended pressure offloading and continued evaluation by home health nurse  Essential hypertension given diabetes and CKD the patient would benefit from an ACE or ARB.  Will restart her losartan as we are able  Suspected COPD the patient has had marked improvement in her breathing  since she has been regularly using her long-acting muscarinic antagonist.  Follow-up with spirometry with Dr. Valentina Lucks.    HCM The patient and her son declined the influenza vaccine The patient and her son declined colon cancer screening We will continue to address the need for the pneumococcal 13 vaccination given her history of tobacco abuse  At follow up  Repeat TSH Discuss CRC and PMB again  Vaccinations-Tdap, Flu, and PCV13 Restart ARB  Referral to Ophtho  Order her renal ultrasound       Abigail Singh, MD  [facility] Medicine Teaching Service

## 2018-03-03 NOTE — Assessment & Plan Note (Signed)
Present since 2017.  The patient also has a normocytic anemia.  Iron panel was relatively within normal limits.  Obtaining smear today.  Discussed routine cancer screening with patient.  She is hesitant to undergo routine cancer screening. She declines any discussion of this today.  We will continue to address and discuss would recommend endometrial biopsy given history of abnormal uterine bleeding, colon cancer screening and CT chest given lung cancer risk and history of smoking.

## 2018-03-03 NOTE — Patient Instructions (Addendum)
It was wonderful to see you today.  Thank you for choosing [facility].   Please call [phone] with any questions about today's appointment.  Please be sure to schedule follow up at the front  desk before you leave today.   [name], MD  Family Medicine   Please schedule pulmonary function test (spirometry) with Dr. Valentina Lucks  Go to the lab today  Schedule an appointment in 6 weeks   Think about quitting smoking  Great work with losing weight!!!! Nice job cutting down on sodas!   Check your blood sugar once in the morning   Varenicline oral tablets What is this medicine? VARENICLINE (var EN i kleen) is used to help people quit smoking. It is used with a patient support program recommended by your physician. This medicine may be used for other purposes; ask your health care provider or pharmacist if you have questions. COMMON BRAND NAME(S): Chantix What should I tell my health care provider before I take this medicine? They need to know if you have any of these conditions: -heart disease -if you often drink alcohol -kidney disease -mental illness -on hemodialysis -seizures -history of stroke -suicidal thoughts, plans, or attempt; a previous suicide attempt by you or a family member -an unusual or allergic reaction to varenicline, other medicines, foods, dyes, or preservatives -pregnant or trying to get pregnant -breast-feeding How should I use this medicine? Take this medicine by mouth after eating. Take with a full glass of water. Follow the directions on the prescription label. Take your doses at regular intervals. Do not take your medicine more often than directed. There are 3 ways you can use this medicine to help you quit smoking; talk to your health care professional to decide which plan is right for you: 1) you can choose a quit date and start this medicine 1 week before the quit date, or, 2) you can start taking this medicine before you choose a  quit date, and then pick a quit date between day 8 and 35 days of treatment, or, 3) if you are not sure that you are able or willing to quit smoking right away, start taking this medicine and slowly decrease the amount you smoke as directed by your health care professional with the goal of being cigarette-free by week 12 of treatment. Stick to your plan; ask about support groups or other ways to help you remain cigarette-free. If you are motivated to quit smoking and did not succeed during a previous attempt with this medicine for reasons other than side effects, or if you returned to smoking after this treatment, speak with your health care professional about whether another course of this medicine may be right for you. A special MedGuide will be given to you by the pharmacist with each prescription and refill. Be sure to read this information carefully each time. Talk to your pediatrician regarding the use of this medicine in children. This medicine is not approved for use in children. Overdosage: If you think you have taken too much of this medicine contact a poison control center or emergency room at once. NOTE: This medicine is only for you. Do not share this medicine with others. What if I miss a dose? If you miss a dose, take it as soon as you can. If it is almost time for your next dose, take only that dose. Do not take double or extra doses. What may interact with this medicine? -alcohol -insulin -other medicines used to help people  quit smoking -theophylline -warfarin This list may not describe all possible interactions. Give your health care provider a list of all the medicines, herbs, non-prescription drugs, or dietary supplements you use. Also tell them if you smoke, drink alcohol, or use illegal drugs. Some items may interact with your medicine. What should I watch for while using this medicine? It is okay if you do not succeed at your attempt to quit and have a cigarette. You can  still continue your quit attempt and keep using this medicine as directed. Just throw away your cigarettes and get back to your quit plan. Talk to your health care provider before using other treatments to quit smoking. Using this medicine with other treatments to quit smoking may increase the risk for side effects compared to using a treatment alone. You may get drowsy or dizzy. Do not drive, use machinery, or do anything that needs mental alertness until you know how this medicine affects you. Do not stand or sit up quickly, especially if you are an older patient. This reduces the risk of dizzy or fainting spells. Decrease the number of alcoholic beverages that you drink during treatment with this medicine until you know if this medicine affects your ability to tolerate alcohol. Some people have experienced increased drunkenness (intoxication), unusual or sometimes aggressive behavior, or no memory of things that have happened (amnesia) during treatment with this medicine. Sleepwalking can happen during treatment with this medicine, and can sometimes lead to behavior that is harmful to you, other people, or property. Stop taking this medicine and tell your doctor if you start sleepwalking or have other unusual sleep-related activity. After taking this medicine, you may get up out of bed and do an activity that you do not know you are doing. The next morning, you may have no memory of this. Activities include driving a car ("sleep-driving"), making and eating food, talking on the phone, sexual activity, and sleep-walking. Serious injuries have occurred. Stop the medicine and call your doctor right away if you find out you have done any of these activities. Do not take this medicine if you have used alcohol that evening. Do not take it if you have taken another medicine for sleep. The risk of doing these sleep-related activities is higher. Patients and their families should watch out for new or worsening  depression or thoughts of suicide. Also watch out for sudden changes in feelings such as feeling anxious, agitated, panicky, irritable, hostile, aggressive, impulsive, severely restless, overly excited and hyperactive, or not being able to sleep. If this happens, call your health care professional. If you have diabetes and you quit smoking, the effects of insulin may be increased and you may need to reduce your insulin dose. Check with your doctor or health care professional about how you should adjust your insulin dose. What side effects may I notice from receiving this medicine? Side effects that you should report to your doctor or health care professional as soon as possible: -allergic reactions like skin rash, itching or hives, swelling of the face, lips, tongue, or throat -acting aggressive, being angry or violent, or acting on dangerous impulses -breathing problems -changes in emotions or moods -chest pain or chest tightness -feeling faint or lightheaded, falls -hallucination, loss of contact with reality -mouth sores -redness, blistering, peeling or loosening of the skin, including inside the mouth -signs and symptoms of a stroke like changes in vision; confusion; trouble speaking or understanding; severe headaches; sudden numbness or weakness of the face, arm or  leg; trouble walking; dizziness; loss of balance or coordination -seizures -sleepwalking -suicidal thoughts or other mood changes Side effects that usually do not require medical attention (report to your doctor or health care professional if they continue or are bothersome): -constipation -gas -headache -nausea, vomiting -strange dreams -trouble sleeping This list may not describe all possible side effects. Call your doctor for medical advice about side effects. You may report side effects to FDA at 1-800-FDA-1088. Where should I keep my medicine? Keep out of the reach of children. Store at room temperature between 15 and  30 degrees C (59 and 86 degrees F). Throw away any unused medicine after the expiration date. NOTE: This sheet is a summary. It may not cover all possible information. If you have questions about this medicine, talk to your doctor, pharmacist, or health care provider.  2019 Elsevier/Gold Standard (2017-06-28 12:48:08)

## 2018-03-03 NOTE — Assessment & Plan Note (Signed)
Will repeat BMET at follow up and restart ARB as able.

## 2018-03-04 ENCOUNTER — Telehealth: Payer: Self-pay | Admitting: *Deleted

## 2018-03-04 ENCOUNTER — Other Ambulatory Visit: Payer: Self-pay | Admitting: Family Medicine

## 2018-03-04 DIAGNOSIS — I480 Paroxysmal atrial fibrillation: Secondary | ICD-10-CM | POA: Diagnosis not present

## 2018-03-04 DIAGNOSIS — J441 Chronic obstructive pulmonary disease with (acute) exacerbation: Secondary | ICD-10-CM | POA: Diagnosis not present

## 2018-03-04 DIAGNOSIS — I130 Hypertensive heart and chronic kidney disease with heart failure and stage 1 through stage 4 chronic kidney disease, or unspecified chronic kidney disease: Secondary | ICD-10-CM | POA: Diagnosis not present

## 2018-03-04 DIAGNOSIS — E1165 Type 2 diabetes mellitus with hyperglycemia: Secondary | ICD-10-CM

## 2018-03-04 DIAGNOSIS — I2510 Atherosclerotic heart disease of native coronary artery without angina pectoris: Secondary | ICD-10-CM | POA: Diagnosis not present

## 2018-03-04 DIAGNOSIS — I5032 Chronic diastolic (congestive) heart failure: Secondary | ICD-10-CM | POA: Diagnosis not present

## 2018-03-04 LAB — CBC WITH DIFFERENTIAL/PLATELET
BASOS: 1 %
Basophils Absolute: 0.1 10*3/uL (ref 0.0–0.2)
EOS (ABSOLUTE): 0.3 10*3/uL (ref 0.0–0.4)
Eos: 3 %
HEMATOCRIT: 30 % — AB (ref 34.0–46.6)
HEMOGLOBIN: 10.2 g/dL — AB (ref 11.1–15.9)
Immature Grans (Abs): 0.3 10*3/uL — ABNORMAL HIGH (ref 0.0–0.1)
Immature Granulocytes: 2 %
Lymphocytes Absolute: 3.9 10*3/uL — ABNORMAL HIGH (ref 0.7–3.1)
Lymphs: 30 %
MCH: 28.7 pg (ref 26.6–33.0)
MCHC: 34 g/dL (ref 31.5–35.7)
MCV: 84 fL (ref 79–97)
Monocytes Absolute: 0.7 10*3/uL (ref 0.1–0.9)
Monocytes: 5 %
Neutrophils Absolute: 7.6 10*3/uL — ABNORMAL HIGH (ref 1.4–7.0)
Neutrophils: 59 %
Platelets: 299 10*3/uL (ref 150–450)
RBC: 3.56 x10E6/uL — ABNORMAL LOW (ref 3.77–5.28)
RDW: 14.3 % (ref 11.7–15.4)
WBC: 12.9 10*3/uL — ABNORMAL HIGH (ref 3.4–10.8)

## 2018-03-04 MED ORDER — GLUCOSE BLOOD VI STRP: ORAL_STRIP | 12 refills | Status: DC

## 2018-03-04 NOTE — Progress Notes (Signed)
Patient is to test once daily for type 2 diabetes in morning.

## 2018-03-04 NOTE — Telephone Encounter (Signed)
Son calls because they have ran out of strips.  He had trouble figuring out how to use them and then they were testing 2 times a day.  They only way to get insurance to pay for them before May would be to change script to more frequent testing.   I spoke with Dr. [name], she was actually going to change to to testing 1x daily. She has sent this in.  Spoke with Son.  Advised that we have sent in a new script but a new CMN would need to be completed.   I have completed the CMN for and Faxed.  Will place copy in batch scanning. Alyce Inscore, [name], CMA

## 2018-03-05 LAB — PATHOLOGIST SMEAR REVIEW
Basophils Absolute: 0.1 10*3/uL (ref 0.0–0.2)
Basos: 1 %
EOS (ABSOLUTE): 0.4 10*3/uL (ref 0.0–0.4)
EOS: 3 %
Hematocrit: 31.3 % — ABNORMAL LOW (ref 34.0–46.6)
Hemoglobin: 10.3 g/dL — ABNORMAL LOW (ref 11.1–15.9)
Immature Grans (Abs): 0.3 10*3/uL — ABNORMAL HIGH (ref 0.0–0.1)
Immature Granulocytes: 2 %
Lymphocytes Absolute: 3.9 10*3/uL — ABNORMAL HIGH (ref 0.7–3.1)
Lymphs: 30 %
MCH: 28.3 pg (ref 26.6–33.0)
MCHC: 32.9 g/dL (ref 31.5–35.7)
MCV: 86 fL (ref 79–97)
Monocytes Absolute: 0.7 10*3/uL (ref 0.1–0.9)
Monocytes: 5 %
Neutrophils Absolute: 7.6 10*3/uL — ABNORMAL HIGH (ref 1.4–7.0)
Neutrophils: 59 %
Path Rev PLTs: NORMAL
Platelets: 310 10*3/uL (ref 150–450)
RBC: 3.64 x10E6/uL — ABNORMAL LOW (ref 3.77–5.28)
RDW: 14.2 % (ref 11.7–15.4)
WBC: 12.9 10*3/uL — AB (ref 3.4–10.8)

## 2018-03-05 LAB — URINE CULTURE

## 2018-03-05 LAB — CUP PACEART REMOTE DEVICE CHECK
Battery Impedance: 206 Ohm
Battery Remaining Longevity: 110 mo
Battery Voltage: 2.79 V
Brady Statistic AP VP Percent: 31 %
Brady Statistic AP VS Percent: 0 %
Brady Statistic AS VP Percent: 69 %
Date Time Interrogation Session: 20200218151359
Implantable Lead Implant Date: 20070414
Implantable Lead Implant Date: 20070914
Implantable Lead Location: 753860
Implantable Lead Model: 4092
Implantable Lead Model: 5594
Implantable Pulse Generator Implant Date: 20160816
Lead Channel Impedance Value: 423 Ohm
Lead Channel Pacing Threshold Amplitude: 0.5 V
Lead Channel Pacing Threshold Amplitude: 0.75 V
Lead Channel Pacing Threshold Pulse Width: 0.4 ms
Lead Channel Pacing Threshold Pulse Width: 0.4 ms
Lead Channel Setting Pacing Amplitude: 2 V
Lead Channel Setting Pacing Amplitude: 2.5 V
Lead Channel Setting Pacing Pulse Width: 0.4 ms
Lead Channel Setting Sensing Sensitivity: 4 mV
MDC IDC LEAD LOCATION: 753859
MDC IDC MSMT LEADCHNL RV IMPEDANCE VALUE: 599 Ohm
MDC IDC STAT BRADY AS VS PERCENT: 0 %

## 2018-03-06 ENCOUNTER — Telehealth: Payer: Self-pay | Admitting: Family Medicine

## 2018-03-06 DIAGNOSIS — I5032 Chronic diastolic (congestive) heart failure: Secondary | ICD-10-CM | POA: Diagnosis not present

## 2018-03-06 DIAGNOSIS — I2510 Atherosclerotic heart disease of native coronary artery without angina pectoris: Secondary | ICD-10-CM | POA: Diagnosis not present

## 2018-03-06 DIAGNOSIS — I130 Hypertensive heart and chronic kidney disease with heart failure and stage 1 through stage 4 chronic kidney disease, or unspecified chronic kidney disease: Secondary | ICD-10-CM | POA: Diagnosis not present

## 2018-03-06 DIAGNOSIS — I480 Paroxysmal atrial fibrillation: Secondary | ICD-10-CM | POA: Diagnosis not present

## 2018-03-06 DIAGNOSIS — E1165 Type 2 diabetes mellitus with hyperglycemia: Secondary | ICD-10-CM | POA: Diagnosis not present

## 2018-03-06 DIAGNOSIS — J441 Chronic obstructive pulmonary disease with (acute) exacerbation: Secondary | ICD-10-CM | POA: Diagnosis not present

## 2018-03-06 NOTE — Telephone Encounter (Signed)
Called patient with results.   Urine culture- negative. Recommend stopping antibiotics.  Leukocytosis- this is improved but persistent. There is lymphocytosis, which is mild. Repeat CBC with differential and smear at follow up. If persistent -> Hematology.   Mild Anemia- not iron deficiency, suspect due to CKD and anemia of chronic disease. Will continue to discuss routine cancer screening. Patient declined CSY and referral back to Gynecology for PMB. (Reported PMB in 2017).

## 2018-03-07 ENCOUNTER — Telehealth: Payer: Self-pay | Admitting: Family Medicine

## 2018-03-07 DIAGNOSIS — E1165 Type 2 diabetes mellitus with hyperglycemia: Secondary | ICD-10-CM | POA: Diagnosis not present

## 2018-03-07 DIAGNOSIS — I480 Paroxysmal atrial fibrillation: Secondary | ICD-10-CM | POA: Diagnosis not present

## 2018-03-07 DIAGNOSIS — J441 Chronic obstructive pulmonary disease with (acute) exacerbation: Secondary | ICD-10-CM | POA: Diagnosis not present

## 2018-03-07 DIAGNOSIS — I2510 Atherosclerotic heart disease of native coronary artery without angina pectoris: Secondary | ICD-10-CM | POA: Diagnosis not present

## 2018-03-07 DIAGNOSIS — I130 Hypertensive heart and chronic kidney disease with heart failure and stage 1 through stage 4 chronic kidney disease, or unspecified chronic kidney disease: Secondary | ICD-10-CM | POA: Diagnosis not present

## 2018-03-07 DIAGNOSIS — I5032 Chronic diastolic (congestive) heart failure: Secondary | ICD-10-CM | POA: Diagnosis not present

## 2018-03-07 NOTE — Telephone Encounter (Signed)
-  Spoke with Medicare Processing.  Abigail Wallace tells me that the issue is that pharmacy is running as 2 boxes of 25 and that is the issue.   - contacted [facility] @ [place].  He states that they are running it as #100 and it is still not going thru.  He advised to have the patient contact Medicare.  - Called Son to explain what I had found out.  In the meantime he has already called medicare and they state that [facility] has not sent the script in.  He then contacted [facility] and they told him that they would call Medicare.   Son was informed that Medicare is not going to pay for more strips until 04/04/18.  Son is very appreciative of "all our help and will try to figure something out".  He does not think he will be staying with [facility] for diabetes supplies.  [name], [name], CMA

## 2018-03-07 NOTE — Telephone Encounter (Signed)
Pt son called and said that he weren't able to get the pts blood sugar reading since they ran out of the test strips. Medicare is denying payment because of a form not filled out correctly. ad

## 2018-03-07 NOTE — Telephone Encounter (Signed)
Abigail Wallace--- can you look into to this?  I thought I corrected this script.   [name], MD  Family Medicine Teaching Service

## 2018-03-10 ENCOUNTER — Ambulatory Visit: Payer: Medicare Other | Admitting: Family Medicine

## 2018-03-11 DIAGNOSIS — I480 Paroxysmal atrial fibrillation: Secondary | ICD-10-CM | POA: Diagnosis not present

## 2018-03-11 DIAGNOSIS — I2510 Atherosclerotic heart disease of native coronary artery without angina pectoris: Secondary | ICD-10-CM | POA: Diagnosis not present

## 2018-03-11 DIAGNOSIS — I5032 Chronic diastolic (congestive) heart failure: Secondary | ICD-10-CM | POA: Diagnosis not present

## 2018-03-11 DIAGNOSIS — E1165 Type 2 diabetes mellitus with hyperglycemia: Secondary | ICD-10-CM | POA: Diagnosis not present

## 2018-03-11 DIAGNOSIS — J441 Chronic obstructive pulmonary disease with (acute) exacerbation: Secondary | ICD-10-CM | POA: Diagnosis not present

## 2018-03-11 DIAGNOSIS — I130 Hypertensive heart and chronic kidney disease with heart failure and stage 1 through stage 4 chronic kidney disease, or unspecified chronic kidney disease: Secondary | ICD-10-CM | POA: Diagnosis not present

## 2018-03-11 NOTE — Telephone Encounter (Signed)
Pt returned call. Informed him his mother is to be testing one time daily. Pts son stated they were able to buy more test strips and should be ok until next refill on 3/20.

## 2018-03-11 NOTE — Telephone Encounter (Signed)
Called patient and son to discuss new Rx for test strips---ideally, patient would be testing once per day. Left generic voicemail.   [name], MD  Family Medicine Teaching Service

## 2018-03-12 ENCOUNTER — Other Ambulatory Visit (HOSPITAL_COMMUNITY): Payer: Self-pay | Admitting: Cardiovascular Disease

## 2018-03-12 NOTE — Progress Notes (Signed)
Remote pacemaker transmission.   

## 2018-03-13 DIAGNOSIS — I130 Hypertensive heart and chronic kidney disease with heart failure and stage 1 through stage 4 chronic kidney disease, or unspecified chronic kidney disease: Secondary | ICD-10-CM | POA: Diagnosis not present

## 2018-03-13 DIAGNOSIS — I480 Paroxysmal atrial fibrillation: Secondary | ICD-10-CM | POA: Diagnosis not present

## 2018-03-13 DIAGNOSIS — E1165 Type 2 diabetes mellitus with hyperglycemia: Secondary | ICD-10-CM | POA: Diagnosis not present

## 2018-03-13 DIAGNOSIS — J441 Chronic obstructive pulmonary disease with (acute) exacerbation: Secondary | ICD-10-CM | POA: Diagnosis not present

## 2018-03-13 DIAGNOSIS — I5032 Chronic diastolic (congestive) heart failure: Secondary | ICD-10-CM | POA: Diagnosis not present

## 2018-03-13 DIAGNOSIS — I2510 Atherosclerotic heart disease of native coronary artery without angina pectoris: Secondary | ICD-10-CM | POA: Diagnosis not present

## 2018-03-14 DIAGNOSIS — I130 Hypertensive heart and chronic kidney disease with heart failure and stage 1 through stage 4 chronic kidney disease, or unspecified chronic kidney disease: Secondary | ICD-10-CM | POA: Diagnosis not present

## 2018-03-14 DIAGNOSIS — I2510 Atherosclerotic heart disease of native coronary artery without angina pectoris: Secondary | ICD-10-CM | POA: Diagnosis not present

## 2018-03-14 DIAGNOSIS — I480 Paroxysmal atrial fibrillation: Secondary | ICD-10-CM | POA: Diagnosis not present

## 2018-03-14 DIAGNOSIS — J441 Chronic obstructive pulmonary disease with (acute) exacerbation: Secondary | ICD-10-CM | POA: Diagnosis not present

## 2018-03-14 DIAGNOSIS — E1165 Type 2 diabetes mellitus with hyperglycemia: Secondary | ICD-10-CM | POA: Diagnosis not present

## 2018-03-14 DIAGNOSIS — I5032 Chronic diastolic (congestive) heart failure: Secondary | ICD-10-CM | POA: Diagnosis not present

## 2018-03-17 ENCOUNTER — Other Ambulatory Visit: Payer: Self-pay | Admitting: *Deleted

## 2018-03-17 ENCOUNTER — Telehealth: Payer: Self-pay

## 2018-03-17 MED ORDER — SODIUM BICARBONATE 650 MG PO TABS: 650.0000 mg | ORAL_TABLET | Freq: Two times a day (BID) | ORAL | 0 refills | Status: DC

## 2018-03-17 NOTE — Telephone Encounter (Signed)
Reviewed below, will be sure to address at upcoming visits.   [name], MD  Family Medicine Teaching Service

## 2018-03-17 NOTE — Telephone Encounter (Signed)
Almyra Free, care manager, called for informational purposes.  Patient may have new onset neuropathy in toes, complaining of tingling.  Also some edema at right ankle and CBG running in 150s. Not compliant with diet.  Almyra Free [phone]  Danley Danker, RN [facility] RN)

## 2018-03-18 DIAGNOSIS — I130 Hypertensive heart and chronic kidney disease with heart failure and stage 1 through stage 4 chronic kidney disease, or unspecified chronic kidney disease: Secondary | ICD-10-CM | POA: Diagnosis not present

## 2018-03-18 DIAGNOSIS — I2510 Atherosclerotic heart disease of native coronary artery without angina pectoris: Secondary | ICD-10-CM | POA: Diagnosis not present

## 2018-03-18 DIAGNOSIS — I480 Paroxysmal atrial fibrillation: Secondary | ICD-10-CM | POA: Diagnosis not present

## 2018-03-18 DIAGNOSIS — J441 Chronic obstructive pulmonary disease with (acute) exacerbation: Secondary | ICD-10-CM | POA: Diagnosis not present

## 2018-03-18 DIAGNOSIS — I5032 Chronic diastolic (congestive) heart failure: Secondary | ICD-10-CM | POA: Diagnosis not present

## 2018-03-18 DIAGNOSIS — E1165 Type 2 diabetes mellitus with hyperglycemia: Secondary | ICD-10-CM | POA: Diagnosis not present

## 2018-03-21 ENCOUNTER — Encounter: Payer: Self-pay | Admitting: Family Medicine

## 2018-03-24 ENCOUNTER — Telehealth: Payer: Self-pay | Admitting: Family Medicine

## 2018-03-24 NOTE — Telephone Encounter (Signed)
Home health certification and plan of care forms placed in MD's box to be signed. Please call Waynard Edwards from [facility] at [phone] when forms are ready to be pick up.

## 2018-03-25 DIAGNOSIS — I130 Hypertensive heart and chronic kidney disease with heart failure and stage 1 through stage 4 chronic kidney disease, or unspecified chronic kidney disease: Secondary | ICD-10-CM | POA: Diagnosis not present

## 2018-03-25 DIAGNOSIS — J441 Chronic obstructive pulmonary disease with (acute) exacerbation: Secondary | ICD-10-CM | POA: Diagnosis not present

## 2018-03-25 DIAGNOSIS — I2510 Atherosclerotic heart disease of native coronary artery without angina pectoris: Secondary | ICD-10-CM | POA: Diagnosis not present

## 2018-03-25 DIAGNOSIS — E1165 Type 2 diabetes mellitus with hyperglycemia: Secondary | ICD-10-CM | POA: Diagnosis not present

## 2018-03-25 DIAGNOSIS — I5032 Chronic diastolic (congestive) heart failure: Secondary | ICD-10-CM | POA: Diagnosis not present

## 2018-03-25 DIAGNOSIS — I480 Paroxysmal atrial fibrillation: Secondary | ICD-10-CM | POA: Diagnosis not present

## 2018-03-31 NOTE — Telephone Encounter (Signed)
Forms completed, placed in fax pile.   [name], MD  Family Medicine Teaching Service

## 2018-04-04 ENCOUNTER — Other Ambulatory Visit: Payer: Self-pay | Admitting: *Deleted

## 2018-04-04 MED ORDER — ONETOUCH DELICA LANCETS 33G MISC: 3 refills | Status: DC

## 2018-04-07 ENCOUNTER — Other Ambulatory Visit: Payer: Self-pay | Admitting: *Deleted

## 2018-04-07 DIAGNOSIS — J449 Chronic obstructive pulmonary disease, unspecified: Secondary | ICD-10-CM

## 2018-04-07 MED ORDER — PRAVASTATIN SODIUM 40 MG PO TABS: 40.0000 mg | ORAL_TABLET | Freq: Every evening | ORAL | 6 refills | Status: DC

## 2018-04-07 MED ORDER — SODIUM BICARBONATE 650 MG PO TABS: 650.0000 mg | ORAL_TABLET | Freq: Two times a day (BID) | ORAL | 0 refills | Status: DC

## 2018-04-07 MED ORDER — DICLOFENAC SODIUM 1 % TD GEL: 4.0000 g | Freq: Four times a day (QID) | TRANSDERMAL | 2 refills | Status: DC | PRN

## 2018-04-07 MED ORDER — ALBUTEROL SULFATE HFA 108 (90 BASE) MCG/ACT IN AERS: 2.0000 | INHALATION_SPRAY | Freq: Four times a day (QID) | RESPIRATORY_TRACT | 2 refills | Status: DC | PRN

## 2018-04-09 ENCOUNTER — Telehealth: Payer: Self-pay | Admitting: Family Medicine

## 2018-04-09 ENCOUNTER — Other Ambulatory Visit: Payer: Self-pay | Admitting: *Deleted

## 2018-04-09 DIAGNOSIS — J449 Chronic obstructive pulmonary disease, unspecified: Secondary | ICD-10-CM

## 2018-04-09 MED ORDER — PRAVASTATIN SODIUM 40 MG PO TABS: 40.0000 mg | ORAL_TABLET | Freq: Every evening | ORAL | 1 refills | Status: DC

## 2018-04-09 NOTE — Telephone Encounter (Signed)
Pt's son [name] is calling and would like for Dr. [name] to call him to discuss his mother using her nebulizer.   The best call back number is [phone].

## 2018-04-10 ENCOUNTER — Encounter: Payer: Self-pay | Admitting: Family Medicine

## 2018-04-10 MED ORDER — UMECLIDINIUM BROMIDE 62.5 MCG/INH IN AEPB: 1.0000 | INHALATION_SPRAY | Freq: Every day | RESPIRATORY_TRACT | 11 refills | Status: DC

## 2018-04-10 NOTE — Telephone Encounter (Signed)
Called patient to check in. Spoke with both patient and son. Son is VERY worried about mother. He is wondering if he should a nebulizer as prophylaxis to prevent COVID19 infection.  Both are practice social distancing, son is wearing mask. Advise usual precautions, can use nebulizer as needed.   Patient just refilled medications on 3/20. AM BG around 120s typically. Lowest 127, which is significant improvement from prior. Highest value in last month 151.   Smoking cessation-- down to 2 cigarettes a day. No difficulty with cravings.   Lower extremity swelling, intermittent, improving, worse when lets feet hang for long period of time. No dyspnea, orthopnea, PND.   Son very worried about 'congestive heart failure'. HHRN told patient and son (per report) that this was patient's diagnosis. Last echocardiogram 2017, already follows with Cardiology. EF normal, no diastolic function. Discussed mother does not currently carry this diagnosis.   Will plan for WebEx [email or web address] at 11 AM on April 2nd.   All questions answered.   [name], MD  Family Medicine Teaching Service

## 2018-04-14 ENCOUNTER — Telehealth: Payer: Self-pay | Admitting: Family Medicine

## 2018-04-14 NOTE — Telephone Encounter (Signed)
Patients son is calling about an Webex Video on 04/17/18, and also another appointment on 04/22/18 and is wondering about moms lab work.

## 2018-04-14 NOTE — Telephone Encounter (Signed)
Front desk team-- please see detailed note from last week. Can we pilot a WebEx with son as he has email with a functional video on tablet?  Message back with details.

## 2018-04-16 ENCOUNTER — Telehealth: Payer: Self-pay | Admitting: Family Medicine

## 2018-04-16 NOTE — Telephone Encounter (Signed)
Patients son Abigail Wallace) called this morning to see if medicaid or medicare covers postox meter. Moms email is Abigail Wallace for WEbex video.

## 2018-04-17 ENCOUNTER — Ambulatory Visit: Payer: Medicare Other | Admitting: Pharmacist

## 2018-04-17 ENCOUNTER — Encounter: Payer: Self-pay | Admitting: Family Medicine

## 2018-04-17 ENCOUNTER — Ambulatory Visit: Payer: Medicare Other | Admitting: Family Medicine

## 2018-04-17 ENCOUNTER — Other Ambulatory Visit: Payer: Self-pay

## 2018-04-17 ENCOUNTER — Telehealth (INDEPENDENT_AMBULATORY_CARE_PROVIDER_SITE_OTHER): Payer: Medicare Other | Admitting: Family Medicine

## 2018-04-17 DIAGNOSIS — F17200 Nicotine dependence, unspecified, uncomplicated: Secondary | ICD-10-CM

## 2018-04-17 DIAGNOSIS — J449 Chronic obstructive pulmonary disease, unspecified: Secondary | ICD-10-CM

## 2018-04-17 DIAGNOSIS — E1165 Type 2 diabetes mellitus with hyperglycemia: Secondary | ICD-10-CM | POA: Diagnosis not present

## 2018-04-17 DIAGNOSIS — I10 Essential (primary) hypertension: Secondary | ICD-10-CM

## 2018-04-17 MED ORDER — AMLODIPINE BESYLATE 10 MG PO TABS: 10.0000 mg | ORAL_TABLET | Freq: Every day | ORAL | 3 refills | Status: DC

## 2018-04-17 MED ORDER — SODIUM BICARBONATE 650 MG PO TABS: 650.0000 mg | ORAL_TABLET | Freq: Two times a day (BID) | ORAL | 0 refills | Status: DC

## 2018-04-17 NOTE — Progress Notes (Signed)
Patient Name: Abigail Wallace Date of Birth: 05/18/1945 Date of Visit: 04/17/18 PCP: Martyn Malay, MD  The patient consented to a virtual video visit.   Chief Complaint: diabetes check   Subjective: Abigail Wallace is a pleasant 73 y.o. with medical history significant for tobacco abuse, type 2 diabetes with hyperglycemia, morbid obesity, COPD, and mood disorder presenting today for video visit for her diabetes. She is joined by her son on the WebEx video call.  Patient and her son are very worried about the current pandemic.  Her son is shopping once a week and is being very cautious with his hygiene practices.  Abigail Wallace is staying inside.  She denies signs or symptoms of infection currently.  She reports overall she is doing well.  In terms of her diabetes, she is on glipizide BID and a DPP4 inhibitor. Reviewed all BG over past 2 weeks. Lowest 108, highest 146 in AM. Random 127 before dinner.  She has eliminated all fried foods, all soft drinks.  Has made a number of dietary changes.  She is only having 1-2 candy bars at most per week.  These are miniature.  She is using olive oil to cook with her son.  She is transition all of her little debbies which she used to snack on several times a day to paired snacks of hummus with some whole-grain crackers instead.  Still enjoys having a candy bar and a soda 1-2 times a week at most.  In terms of the patient blood pressure she is currently on amlodipine 10 mg.  She does have some lower extremity edema which does not bother her considerably.  Her blood pressures ranged from 136/72 to the highest of 153/76.  She denies chest pain, difficulty breathing, dizziness upon standing or falls  Patient is using her Incruse once a day and her albuterol once a day.  Her son is very concerned about her breathing at times.  He would very much like a pulse oximeter.  The patient's daughter-in-law is a Marine scientist and has recommended this to the family.  The patient is  going to pick him up today  ROS: No fevers, chest pain, shortness of breath, or cough.   I have reviewed the patient's medical, surgical, family, and social history as appropriate.   Well appearing, talking in full sentences. Obese, no pallor. No new rashes. Left ptosis of eyelid, symmetric lid raise, stable from prior.    Diagnoses and all orders for this visit:  Type 2 diabetes mellitus with hyperglycemia, without long-term current use of insulin ([place]) well-controlled at this time.  No hypoglycemia.  Will continue current regiment  Tobacco use disorder down to 2 cigarettes/day and doing well  Chronic obstructive pulmonary disease, unspecified COPD type ([place]) -     For home use only DME Pulse oximeter -  Discussed at length that I recommend only checking the pulse ox if he is concerned about her breathing and describe the signs of difficulty breathing and increased work of breathing.  Son and patient are very anxious and have already ordered the pulse oximeter.  I discussed that her oxygen goal is 88 to 92%.  If her oxygen is 88 to 92% this is within her normal range given her underlying diagnosis of COPD  Primary hypertension at goal per home monitor I do think that her lower extremity edema is in part due to this medication will consider reducing the dose and/or transitioning to an alternative.  She cannot have  an ACE or an arm due to her underlying CKD at this time.  Could consider chlorthalidone in the future with her creatinine clearance that is nearing 30 -     amLODipine (NORVASC) 10 MG tablet; Take 1 tablet (10 mg total) by mouth daily.  Non-anion gap acidosis due to underlying chronic kidney disease refilled only needs follow-up with nephrology -     sodium bicarbonate 650 MG tablet; Take 1 tablet (650 mg total) by mouth 2 (two) times daily.  Will call son and patient back in about 2 to 3 weeks to check and given their degree of anxiety.  [name], MD  Family Medicine  Teaching Service

## 2018-04-18 ENCOUNTER — Telehealth: Payer: Self-pay | Admitting: *Deleted

## 2018-04-18 DIAGNOSIS — J449 Chronic obstructive pulmonary disease, unspecified: Secondary | ICD-10-CM

## 2018-04-18 NOTE — Telephone Encounter (Signed)
Received message back from [facility].  The order needs to be more specific and electronically signed.  See below:  Catron, Stanford Breed, Melissa; Sanye Ledesma, Laban Emperor, CMA; Julian Hy Samuel Germany        Are they needing an overnight oximetry or a finger pulse ox machine? I have pulled the information but the order needs to be signed and needs to be specific as to the above   Previous Messages    ----- Message -----  From: Darlina Guys  Sent: 04/18/2018  1:56 PM EDT  To: Lynann Bologna Addysen Louth, CMA  Subject: RE: DME                      Yes, we can do this! I just need to know how the ONO is to be performed. On room air?        Dr. [name], I have added the order.  Please review for accuracy and I will reach back out to [facility] after you sign. Christen Bame, CMA

## 2018-04-18 NOTE — Telephone Encounter (Signed)
Community message sent to [employer] and Darlina Guys @ [facility] to process DME order on pulse ox machine.   Christen Bame, CMA

## 2018-04-21 NOTE — Telephone Encounter (Signed)
Order for just finger pulse ox machine.  [name], MD  Family Medicine Teaching Service

## 2018-04-21 NOTE — Telephone Encounter (Signed)
Order has been processed.  Abigail Wallace, CMA

## 2018-04-21 NOTE — Telephone Encounter (Signed)
New order signed. Community Message sent to [facility] to process.  Christen Bame, CMA

## 2018-04-22 ENCOUNTER — Telehealth: Payer: Self-pay | Admitting: Family Medicine

## 2018-04-22 NOTE — Telephone Encounter (Signed)
Talked with son Marnette Burgess RE Patient and her breathing.  He stated he has purchased a pulse Ox and has observed reading ~ 94.    He states she is taking Incruse daily AND albuterol inhaler 1 once daily in the AM.   Her dyspnea is worst in the evening    He reported 10 doses remain in the Iincure device.   We discussed using albuterol in the eveing (2 puffs).    I offered to call on Monday (6 days in the future) to assess change.  We also discussed the potential to change to LABA/LAMA :  Stiolto Respimat which is covered by Medicaid. (dose is 2 inhalations ONCE daily).  Plan to call in 6 days.

## 2018-04-22 NOTE — Telephone Encounter (Signed)
Attempted return to Abigail Wallace - left message.   Plan to try again later today if I have not had return call by that time.  My office number was provided.

## 2018-04-22 NOTE — Telephone Encounter (Signed)
Pt son called concerning his mothers appt with Valentina Lucks that was cancelled. Son would like to know what to do about this appt because his mother needs to be seen by Valentina Lucks. He requests a call from [place] to discuss this.

## 2018-04-24 ENCOUNTER — Telehealth: Payer: Self-pay | Admitting: Cardiovascular Disease

## 2018-04-24 ENCOUNTER — Ambulatory Visit: Payer: Medicare Other | Admitting: Pharmacist

## 2018-04-24 NOTE — Telephone Encounter (Addendum)
Attempted to contact pt. Pt answered phone then disconnected the line.

## 2018-04-24 NOTE — Telephone Encounter (Signed)
New Message            Patient is needing a call to discuss a partucular test that she is suppose to have done along with an ultra sound. Pls  Call to advise

## 2018-04-25 ENCOUNTER — Telehealth: Payer: Self-pay | Admitting: Cardiovascular Disease

## 2018-04-25 NOTE — Telephone Encounter (Signed)
New Message   Patient would like a nurse to give her a call.

## 2018-04-25 NOTE — Telephone Encounter (Signed)
LM TO CALL BACK ./CY 

## 2018-04-25 NOTE — Telephone Encounter (Signed)
Spoke with pt and pt's son and appears pt is due for appt in July not scheduling that far out as of yet Encourage to call back end of month or beginning of May for that appt the patient agrees ./cy

## 2018-04-28 ENCOUNTER — Telehealth: Payer: Self-pay | Admitting: Pharmacist

## 2018-04-28 MED ORDER — TIOTROPIUM BROMIDE-OLODATEROL 2.5-2.5 MCG/ACT IN AERS: 2.0000 | INHALATION_SPRAY | Freq: Every day | RESPIRATORY_TRACT | 5 refills | Status: DC

## 2018-04-28 NOTE — Telephone Encounter (Signed)
Breathing has been without major change.  No complaints of nocturnal shortness of breath since using alubuterol at night.   Agreed to change to LABA /LAMA   Stiolto Respimat is covered by Medicaid. Patient's sone was educated on purpose, proper use and potential adverse effects of dry mouth.   Following instruction patient's son verbalized understanding of treatment plan.   Continue to follow, tremor, Heart Rate and rhythm.   Reevaulate breathing without daily use of albuterol.  (Albuterol only for rescue).   New rx fro [employer] sent to [facility].

## 2018-04-28 NOTE — Telephone Encounter (Signed)
Noted and agree. 

## 2018-04-29 ENCOUNTER — Telehealth: Payer: Self-pay | Admitting: *Deleted

## 2018-04-29 ENCOUNTER — Other Ambulatory Visit: Payer: Self-pay

## 2018-04-29 ENCOUNTER — Ambulatory Visit: Payer: Medicare Other | Admitting: Sports Medicine

## 2018-04-29 MED ORDER — ALLOPURINOL 100 MG PO TABS: 100.0000 mg | ORAL_TABLET | Freq: Every day | ORAL | 7 refills | Status: DC

## 2018-04-29 MED ORDER — METOPROLOL TARTRATE 50 MG PO TABS: 50.0000 mg | ORAL_TABLET | Freq: Two times a day (BID) | ORAL | 7 refills | Status: DC

## 2018-04-29 MED ORDER — UMECLIDINIUM-VILANTEROL 62.5-25 MCG/INH IN AEPB: 1.0000 | INHALATION_SPRAY | Freq: Every day | RESPIRATORY_TRACT | 5 refills | Status: DC

## 2018-04-29 NOTE — Telephone Encounter (Signed)
  Cover My Meds info: Key: [id number]  Christen Bame, CMA

## 2018-04-29 NOTE — Telephone Encounter (Signed)
PA needed on Stiolto Respimat.  Form placed in Dr. Cline Crock box. Christen Bame, CMA

## 2018-04-29 NOTE — Telephone Encounter (Signed)
Spoke with Dr. Valentina Lucks.  Per formulary stiolto is non formulary.  ANORO ELLIPTA is, per Dr. Valentina Lucks called in new script for [facility] to take 1 puff daily.   LMOVM for [name] to call back, so I could explain. Christen Bame, CMA

## 2018-05-01 NOTE — Telephone Encounter (Signed)
Pts son [name] was informed. Christen Bame, CMA

## 2018-05-02 ENCOUNTER — Telehealth (INDEPENDENT_AMBULATORY_CARE_PROVIDER_SITE_OTHER): Payer: Medicare Other | Admitting: Family Medicine

## 2018-05-02 ENCOUNTER — Other Ambulatory Visit: Payer: Self-pay

## 2018-05-02 ENCOUNTER — Telehealth: Payer: Medicare Other

## 2018-05-02 ENCOUNTER — Other Ambulatory Visit: Payer: Self-pay | Admitting: Cardiovascular Disease

## 2018-05-02 DIAGNOSIS — E1165 Type 2 diabetes mellitus with hyperglycemia: Secondary | ICD-10-CM | POA: Diagnosis not present

## 2018-05-02 DIAGNOSIS — N1832 Chronic kidney disease, stage 3b: Secondary | ICD-10-CM

## 2018-05-02 DIAGNOSIS — N183 Chronic kidney disease, stage 3 (moderate): Secondary | ICD-10-CM

## 2018-05-02 DIAGNOSIS — F17200 Nicotine dependence, unspecified, uncomplicated: Secondary | ICD-10-CM

## 2018-05-02 NOTE — Telephone Encounter (Signed)
Lopressor 50 mg refilled. 

## 2018-05-02 NOTE — Progress Notes (Signed)
[place] Telemedicine Visit  Patient consented to have virtual visit. Method of visit: Video   Encounter participants: Patient: Abigail Wallace - located at home Provider: Martyn Malay - located at [facility] Others (if applicable): Pearla Dubonnet MS I   Chief Complaint: High blood sugar levels at nighttime and multiple questions about medications  HPI: Abigail Wallace is a pleasant 73 year old woman with history of schizophrenia, resting tremor, type 2 diabetes with hyperglycemia, chronic kidney disease, chronic obstructive pulmonary disease (needs spirometry), and kidney nodules presenting via video visit for a check in.  The patient declined a video visit as her son is away from the house today.  She cannot use this technology.  In terms of the patient's glucose levels the values are listed in detail below.  The patient has done very well with changing her diet.  She is very pleased with the values below.  Denies any symptoms of hypoglycemia. April 1- 118 4/4- 117 7 PM 157  4/5 126  4/6 134 4/7 148 (at 902 AM), 169 (618 PM) 4/8 135  4/8 128 4/10 130  4/11 136 4/12 130  4/13- 132 4/ 14 125 She is currently taking glipizide twice a day and linagliptin once a day.  Tobacco Cessation The patient is gone back up to 3 cigarettes/day in the last several weeks.  She reports that she is determined to go back down to 2 cigarettes.  She reports that her anxiety has gotten the best of her and her tremor is acting up which is causing her to smoke more.    ROS: per HPI  Pertinent PMHx:  Renal cyst for which she needs a follow-up kidney ultrasound in May as soon as possible. Hypertension Schizophrenia COPD Chronic kidney disease with a mild acidosis Atrial fibrillation on apixaban  Exam:  Respiratory: Speaking in full sentences breathing comfortably  Assessment/Plan: Diagnoses and all orders for this visit:  Type 2 diabetes mellitus with hyperglycemia,  without long-term current use of insulin ([place]) discussed dietary strategies at length.  We discussed the appropriate use of her glipizide.  We will continue to monitor her blood sugars as below.  She is due for an A1c and request a home health evaluation for labs.  We will work towards this.  CKD stage IIIb given the patient's chronic kidney disease and her previous acidosis this does require significant monitoring.  She is unable to come into the office due to the fact that she cannot ambulate due to her back pain and breathing condition.  She does not drive.  Home health RN requested for this  Tobacco use disorder discussed tobacco cessation strategies.  Congratulated on setting another cutdown date.  Time spent during visit with patient: 15 minutes

## 2018-05-05 ENCOUNTER — Other Ambulatory Visit: Payer: Self-pay

## 2018-05-05 ENCOUNTER — Telehealth: Payer: Self-pay | Admitting: Family Medicine

## 2018-05-05 MED ORDER — PRAVASTATIN SODIUM 40 MG PO TABS: 40.0000 mg | ORAL_TABLET | Freq: Every evening | ORAL | 1 refills | Status: DC

## 2018-05-05 NOTE — Telephone Encounter (Signed)
Returned patient's call.Spoke with son and patient re: use of albuterol. Answered questions about HH, labs, V8 juice, and albuterol. Son to call with further questions.  [name], MD  Family Medicine Teaching Service

## 2018-05-05 NOTE — Telephone Encounter (Signed)
Pt's son called requesting a call back from Dr. [name] - he said it's concerning and order Dr. [name] put in for [facility] and they are now having some concerns about it. Please call son back to discuss this.

## 2018-05-07 DIAGNOSIS — N183 Chronic kidney disease, stage 3 (moderate): Secondary | ICD-10-CM | POA: Diagnosis not present

## 2018-05-07 DIAGNOSIS — I2510 Atherosclerotic heart disease of native coronary artery without angina pectoris: Secondary | ICD-10-CM | POA: Diagnosis not present

## 2018-05-07 DIAGNOSIS — I129 Hypertensive chronic kidney disease with stage 1 through stage 4 chronic kidney disease, or unspecified chronic kidney disease: Secondary | ICD-10-CM | POA: Diagnosis not present

## 2018-05-07 DIAGNOSIS — Z7901 Long term (current) use of anticoagulants: Secondary | ICD-10-CM | POA: Diagnosis not present

## 2018-05-07 DIAGNOSIS — G8193 Hemiplegia, unspecified affecting right nondominant side: Secondary | ICD-10-CM | POA: Diagnosis not present

## 2018-05-07 DIAGNOSIS — Z993 Dependence on wheelchair: Secondary | ICD-10-CM | POA: Diagnosis not present

## 2018-05-07 DIAGNOSIS — Z7984 Long term (current) use of oral hypoglycemic drugs: Secondary | ICD-10-CM | POA: Diagnosis not present

## 2018-05-07 DIAGNOSIS — I480 Paroxysmal atrial fibrillation: Secondary | ICD-10-CM | POA: Diagnosis not present

## 2018-05-07 DIAGNOSIS — B91 Sequelae of poliomyelitis: Secondary | ICD-10-CM | POA: Diagnosis not present

## 2018-05-07 DIAGNOSIS — F0280 Dementia in other diseases classified elsewhere without behavioral disturbance: Secondary | ICD-10-CM | POA: Diagnosis not present

## 2018-05-07 DIAGNOSIS — J449 Chronic obstructive pulmonary disease, unspecified: Secondary | ICD-10-CM | POA: Diagnosis not present

## 2018-05-07 DIAGNOSIS — E1165 Type 2 diabetes mellitus with hyperglycemia: Secondary | ICD-10-CM | POA: Diagnosis not present

## 2018-05-07 DIAGNOSIS — M1711 Unilateral primary osteoarthritis, right knee: Secondary | ICD-10-CM | POA: Diagnosis not present

## 2018-05-07 DIAGNOSIS — F17210 Nicotine dependence, cigarettes, uncomplicated: Secondary | ICD-10-CM | POA: Diagnosis not present

## 2018-05-07 DIAGNOSIS — Z6841 Body Mass Index (BMI) 40.0 and over, adult: Secondary | ICD-10-CM | POA: Diagnosis not present

## 2018-05-07 DIAGNOSIS — E039 Hypothyroidism, unspecified: Secondary | ICD-10-CM | POA: Diagnosis not present

## 2018-05-07 DIAGNOSIS — I890 Lymphedema, not elsewhere classified: Secondary | ICD-10-CM | POA: Diagnosis not present

## 2018-05-07 DIAGNOSIS — F200 Paranoid schizophrenia: Secondary | ICD-10-CM | POA: Diagnosis not present

## 2018-05-07 DIAGNOSIS — E1122 Type 2 diabetes mellitus with diabetic chronic kidney disease: Secondary | ICD-10-CM | POA: Diagnosis not present

## 2018-05-08 ENCOUNTER — Telehealth: Payer: Self-pay

## 2018-05-08 NOTE — Telephone Encounter (Signed)
Almyra Free called nurse line requesting vernal orders for skilled nursing.   1x week for 5 weeks and 2 PRN   [phone]- can leave a VM.

## 2018-05-08 NOTE — Telephone Encounter (Signed)
Almyra Free informed. Wants telemedicine note and med list faxed to her . [phone] attn: Almyra Free. Deseree Kennon Holter, CMA

## 2018-05-08 NOTE — Telephone Encounter (Signed)
Agree with orders for skilled nursing.   Please call Almyra Free.  [name], MD  Family Medicine Teaching Service

## 2018-05-09 ENCOUNTER — Other Ambulatory Visit: Payer: Self-pay | Admitting: Family Medicine

## 2018-05-09 ENCOUNTER — Other Ambulatory Visit: Payer: Self-pay | Admitting: Cardiovascular Disease

## 2018-05-09 DIAGNOSIS — R0781 Pleurodynia: Secondary | ICD-10-CM

## 2018-05-09 MED ORDER — PRAVASTATIN SODIUM 40 MG PO TABS: 40.0000 mg | ORAL_TABLET | Freq: Every evening | ORAL | 2 refills | Status: DC

## 2018-05-09 NOTE — Telephone Encounter (Signed)
 *  STAT* If patient is at the pharmacy, call can be transferred to refill team.   1. Which medications need to be refilled? (please list name of each medication and dose if known) pravastatin (PRAVACHOL) 40 MG tablet  2. Which pharmacy/location (including street and city if local pharmacy) is medication to be sent to? [place]  3. Do they need a 30 day or 90 day supply? 90  Prescription was sent to [facility] which her son says she does not get her meds there and never has. Please remove from her pharmacy list and send script to correct pharmacy.

## 2018-05-12 ENCOUNTER — Telehealth: Payer: Self-pay

## 2018-05-12 NOTE — Telephone Encounter (Signed)
Pts son called nurse line wondering if Cal-Mag-D-Zinc is a safe supplement for her to take? Please advise.

## 2018-05-12 NOTE — Telephone Encounter (Signed)
Pt informed. Deseree Blount, CMA  

## 2018-05-12 NOTE — Telephone Encounter (Signed)
Okay to take 1 per day.   Please call [name] and let him know. Let me know if they have questions.   Thanks! [name], MD  Family Medicine Teaching Service

## 2018-05-15 DIAGNOSIS — I2510 Atherosclerotic heart disease of native coronary artery without angina pectoris: Secondary | ICD-10-CM | POA: Diagnosis not present

## 2018-05-15 DIAGNOSIS — N183 Chronic kidney disease, stage 3 (moderate): Secondary | ICD-10-CM | POA: Diagnosis not present

## 2018-05-15 DIAGNOSIS — E1165 Type 2 diabetes mellitus with hyperglycemia: Secondary | ICD-10-CM | POA: Diagnosis not present

## 2018-05-15 DIAGNOSIS — F200 Paranoid schizophrenia: Secondary | ICD-10-CM | POA: Diagnosis not present

## 2018-05-15 DIAGNOSIS — I129 Hypertensive chronic kidney disease with stage 1 through stage 4 chronic kidney disease, or unspecified chronic kidney disease: Secondary | ICD-10-CM | POA: Diagnosis not present

## 2018-05-15 DIAGNOSIS — G8193 Hemiplegia, unspecified affecting right nondominant side: Secondary | ICD-10-CM | POA: Diagnosis not present

## 2018-05-15 DIAGNOSIS — E1122 Type 2 diabetes mellitus with diabetic chronic kidney disease: Secondary | ICD-10-CM | POA: Diagnosis not present

## 2018-05-19 ENCOUNTER — Telehealth: Payer: Self-pay | Admitting: Family Medicine

## 2018-05-19 NOTE — Telephone Encounter (Signed)
Called Almyra Free earlier today who reports labs had been sent over on Friday, May 1st. I have not yet received the results of the labs.  Called patient, left generic voicemail to call back.  Nursing- please call [facility] RN, [phone]) and ask for the following values: Creatinine, A1C, and Potassium.  Thanks, [name], MD  [facility] Medicine Teaching Service

## 2018-05-19 NOTE — Telephone Encounter (Signed)
Pt's son calling about pt's last lab results. Please give pt's son a call back at [phone].

## 2018-05-20 ENCOUNTER — Telehealth: Payer: Self-pay

## 2018-05-20 NOTE — Telephone Encounter (Signed)
Called Almyra Free from ([place], [phone] and asked her to fax patient lab results.(Creatinine, A1C, and Potassium)  Almyra Free states that she will call her office to fax it again.  Ozella Almond, [place]

## 2018-05-21 ENCOUNTER — Telehealth: Payer: Self-pay | Admitting: Family Medicine

## 2018-05-21 ENCOUNTER — Encounter: Payer: Self-pay | Admitting: Family Medicine

## 2018-05-21 NOTE — Telephone Encounter (Signed)
Called patient and son with results. Discussed in great detail.   [name], MD  Family Medicine Teaching Service

## 2018-05-22 DIAGNOSIS — E1122 Type 2 diabetes mellitus with diabetic chronic kidney disease: Secondary | ICD-10-CM | POA: Diagnosis not present

## 2018-05-22 DIAGNOSIS — I2510 Atherosclerotic heart disease of native coronary artery without angina pectoris: Secondary | ICD-10-CM | POA: Diagnosis not present

## 2018-05-22 DIAGNOSIS — E1165 Type 2 diabetes mellitus with hyperglycemia: Secondary | ICD-10-CM | POA: Diagnosis not present

## 2018-05-22 DIAGNOSIS — N183 Chronic kidney disease, stage 3 (moderate): Secondary | ICD-10-CM | POA: Diagnosis not present

## 2018-05-22 DIAGNOSIS — G8193 Hemiplegia, unspecified affecting right nondominant side: Secondary | ICD-10-CM | POA: Diagnosis not present

## 2018-05-22 DIAGNOSIS — I129 Hypertensive chronic kidney disease with stage 1 through stage 4 chronic kidney disease, or unspecified chronic kidney disease: Secondary | ICD-10-CM | POA: Diagnosis not present

## 2018-05-26 ENCOUNTER — Telehealth (INDEPENDENT_AMBULATORY_CARE_PROVIDER_SITE_OTHER): Payer: Medicare Other | Admitting: Family Medicine

## 2018-05-26 ENCOUNTER — Other Ambulatory Visit: Payer: Self-pay

## 2018-05-26 DIAGNOSIS — K5900 Constipation, unspecified: Secondary | ICD-10-CM | POA: Diagnosis not present

## 2018-05-26 NOTE — Progress Notes (Signed)
[place] Telemedicine Visit  Patient consented to have virtual visit. Method of visit: Telephone  Encounter participants: Patient: Abigail Wallace - located at home Provider[facility] Guadalupe [facility] at [facility] Others (if applicable): Patient's son also located at home  Chief Complaint: constipation  HPI: 73 year old female who calls for constipation.  She has had 1 hard bowel movement over the last week.  She is still passing some flatus but it is decreased per son.  They have tried Colace and bisacodyl at home.  Taking these both roughly 2 times per day for the last 2 days.  Patient has been having good water intake and has been eating good leafy green vegetables with whole-grain bread.  She is not having any abdominal pain aside from some mild bloating sensation.  Patient has a known large ventral abdominal wall hernia which contains omental fat and transverse colon per CT scan on 01/15/2017.  This is not painful but has no signs or symptoms of incarceration per son's report.  ROS: per HPI  Pertinent PMHx: Large ventral hernia  Exam:  General: No acute distress, pleasant Respiratory: No shortness of breath, no respiratory distress, no wheezing appreciated on phone Psych: Pleasant, appropriate  Per son's report Abdomen: No abdominal wall tenderness, minimal bloating  Assessment/Plan:  Constipation Can continue taking bisacodyl and Colace.  Discussed continuing to hydrate well with water and eat plenty of fiber.  Discussed alarm signs consistent with bowel obstruction or incarceration of hernia.  Unclear etiology of constipation, leading differential is polypharmacy and possible lack of water and fiber intake.  Patient can add on MiraLAX, discussed titrating dose upwards.  Told his not previously worked with the patient but we will give it another try.  If constipation not resolved in 2 to 3 days instructed patient to call back, at that point likely will need in  person visit for thorough physical exam to rule out obstruction possible hernia pathology.  Also discussed that walking and activity helps resolve constipation, unfortunately due to patient's hip her mobility is severely impaired. -Continue Colace and bisacodyl -MiraLAX start at 0.5 capfuls and titrate upwards to 2 capfuls -If symptoms not resolved in 2 to 3 days, patient to call back -Discussed alarm signs and emergency symptoms    Time spent during visit with patient: 16 minutes

## 2018-05-26 NOTE — Assessment & Plan Note (Addendum)
Can continue taking bisacodyl and Colace.  Discussed continuing to hydrate well with water and eat plenty of fiber.  Discussed alarm signs consistent with bowel obstruction or incarceration of hernia.  Unclear etiology of constipation, leading differential is polypharmacy and possible lack of water and fiber intake.  Patient can add on MiraLAX, discussed titrating dose upwards.  Told his not previously worked with the patient but we will give it another try.  If constipation not resolved in 2 to 3 days instructed patient to call back, at that point likely will need in person visit for thorough physical exam to rule out obstruction possible hernia pathology.  Also discussed that walking and activity helps resolve constipation, unfortunately due to patient's hip her mobility is severely impaired. -Continue Colace and bisacodyl -MiraLAX start at 0.5 capfuls and titrate upwards to 2 capfuls -If symptoms not resolved in 2 to 3 days, patient to call back -Discussed alarm signs and emergency symptoms

## 2018-05-29 DIAGNOSIS — G8193 Hemiplegia, unspecified affecting right nondominant side: Secondary | ICD-10-CM | POA: Diagnosis not present

## 2018-05-29 DIAGNOSIS — E1165 Type 2 diabetes mellitus with hyperglycemia: Secondary | ICD-10-CM | POA: Diagnosis not present

## 2018-05-29 DIAGNOSIS — I2510 Atherosclerotic heart disease of native coronary artery without angina pectoris: Secondary | ICD-10-CM | POA: Diagnosis not present

## 2018-05-29 DIAGNOSIS — N183 Chronic kidney disease, stage 3 (moderate): Secondary | ICD-10-CM | POA: Diagnosis not present

## 2018-05-29 DIAGNOSIS — E1122 Type 2 diabetes mellitus with diabetic chronic kidney disease: Secondary | ICD-10-CM | POA: Diagnosis not present

## 2018-05-29 DIAGNOSIS — I129 Hypertensive chronic kidney disease with stage 1 through stage 4 chronic kidney disease, or unspecified chronic kidney disease: Secondary | ICD-10-CM | POA: Diagnosis not present

## 2018-05-30 ENCOUNTER — Encounter: Payer: Self-pay | Admitting: Family Medicine

## 2018-05-30 ENCOUNTER — Telehealth (INDEPENDENT_AMBULATORY_CARE_PROVIDER_SITE_OTHER): Payer: Medicare Other | Admitting: Family Medicine

## 2018-05-30 ENCOUNTER — Other Ambulatory Visit: Payer: Self-pay

## 2018-05-30 DIAGNOSIS — K5904 Chronic idiopathic constipation: Secondary | ICD-10-CM

## 2018-05-30 MED ORDER — SENNA 8.6 MG PO TABS: 1.0000 | ORAL_TABLET | Freq: Every day | ORAL | 0 refills | Status: DC | PRN

## 2018-05-30 NOTE — Progress Notes (Signed)
[place] Telemedicine Visit  Patient consented to have virtual visit. Method of visit: Telephone  Encounter participants: Patient: Abigail Wallace - located at home Provider: Martyn Malay - located at [facility]  Others (if applicable):   Chief Complaint: Constipation  HPI:  Abigail Wallace is a 73 year old woman with history of tobacco use, obesity, schizophrenia, type 2 diabetes and chronic kidney disease presenting today via telephone visit for constipation.  This is been an ongoing problem since Dr. Dr. Kris Mouton last week.  The patient reports that she has not a bowel movement in 2 weeks.  She is eating and drinking well.  She denies any blood in her stool.  She had abdominal pain earlier this week that is entirely resolved.  She has been taking a half capful of MiraLAX once a day.  She reports her sugars are doing well and overall she has had no significant changes in her diet  ROS: per HPI  Pertinent PMHx:  Chronic kidney disease Type 2 diabetes  Exam:  Respiratory: Speaking in full sentences pleasant and appropriate throughout our conversation  Assessment/Plan:  Idiopathic constipation, possibly related to her change in diet or medications.  I will discuss with the son to ensure she is getting a fluid.  She has not had a colonoscopy which I would recommend in the future for her.  We will continue to discuss this.  Recommend she increase MiraLAX to 3 times a day for the next 2 days and added senna.  We will follow-up on Monday to ensure that she has had a bowel movement.   Time spent during visit with patient: 6 minutes

## 2018-06-02 ENCOUNTER — Other Ambulatory Visit: Payer: Self-pay

## 2018-06-02 ENCOUNTER — Telehealth (INDEPENDENT_AMBULATORY_CARE_PROVIDER_SITE_OTHER): Payer: Medicare Other | Admitting: Family Medicine

## 2018-06-02 ENCOUNTER — Encounter: Payer: Medicare Other | Admitting: *Deleted

## 2018-06-02 ENCOUNTER — Encounter: Payer: Self-pay | Admitting: Family Medicine

## 2018-06-02 DIAGNOSIS — K5904 Chronic idiopathic constipation: Secondary | ICD-10-CM | POA: Diagnosis not present

## 2018-06-02 DIAGNOSIS — R430 Anosmia: Secondary | ICD-10-CM

## 2018-06-02 NOTE — Progress Notes (Signed)
[place] Telemedicine Visit  Patient consented to have virtual visit. Method of visit: Telephone (son is away for part of call)   Encounter participants: Patient: Abigail Wallace - located at home Provider: Martyn Malay - located at [facility]  Others (if applicable): son Selena Lesser)  Chief Complaint: constipation   HPI: Abigail Wallace is a pleasant 73 year old woman with a history of type 2 diabetes, tobacco abuse, HTN, obesity, and psychosis presenting with constipation.   The patient was seen virtually on Friday for constipation. She reports she took 1 Senna, 2 doses of Miralax, and 1 Mag Citrate. She had a  stool on Saturday. No abdominal pain, nausea, vomiting, melena, or hematochezia. This is a change from her usual stool pattern.   The patient reports 1 week of on and off change in taste. She can still smell well. Her taste is find this AM.  She endorses a chronic cough, no change from prior. She feels like 'I might have a cold'. No congestion, sneezing, or myalgias.  Pulse oxygen 95-100%. No shortness of breath, no chest pain.   ROS: per HPI  Pertinent PMHx:  HTN Tobacco abuse DM Type 2  Pulmonary nodules- recommended CT chest, patient declined this   Exam:  Respiratory: breathing comfortably, speaking in full sentences, oxygen 99%, HR 80s (son has pulse oximeter at home)   Assessment/Plan:  Constipation, new, given change in bowel habits, discussed colonoscopy again. Patient amenable to this in July/August. Recommended miralax once daily   Ansomia, patient reports this is intermittent. Discussed this may be a symptom of the SARS COV 2 virus. Symptoms are vague, and smell/taste are fine this AM. No other symptoms currently--she has a chronic cough, unchanged from prior. Daughter in law works at [employer]. She does not leave her home regularly. No sick contacts. That being said, she is at high risk of complications. Reviewed strict return  precautions (she is to present to ED if fever, dyspnea). Will call tomorrow to check in.  Time spent during visit with patient: 14 minutes  [name], MD  [facility] Medicine Teaching Service

## 2018-06-03 ENCOUNTER — Telehealth: Payer: Self-pay

## 2018-06-03 ENCOUNTER — Other Ambulatory Visit: Payer: Self-pay | Admitting: Cardiovascular Disease

## 2018-06-03 ENCOUNTER — Telehealth: Payer: Self-pay | Admitting: Family Medicine

## 2018-06-03 ENCOUNTER — Ambulatory Visit: Payer: Medicare Other | Admitting: Sports Medicine

## 2018-06-03 NOTE — Telephone Encounter (Signed)
Spoke with patient to remind of missed remote transmission 

## 2018-06-03 NOTE — Telephone Encounter (Signed)
Called patient to check in. She reports she feels great. She has decided to skip her Podiatry appointment---going in July 14. Denies chest pain, difficulty breathing, coughing, sneezing.  Temp 98.6 SpO 96% BP  142/69 HR 68  BG AM 126   No further questions. Reviewed reasons to call/RTC.   [name], MD  Family Medicine Teaching Service

## 2018-06-06 ENCOUNTER — Telehealth: Payer: Self-pay | Admitting: *Deleted

## 2018-06-06 DIAGNOSIS — I2510 Atherosclerotic heart disease of native coronary artery without angina pectoris: Secondary | ICD-10-CM | POA: Diagnosis not present

## 2018-06-06 DIAGNOSIS — F17210 Nicotine dependence, cigarettes, uncomplicated: Secondary | ICD-10-CM | POA: Diagnosis not present

## 2018-06-06 DIAGNOSIS — Z993 Dependence on wheelchair: Secondary | ICD-10-CM | POA: Diagnosis not present

## 2018-06-06 DIAGNOSIS — I480 Paroxysmal atrial fibrillation: Secondary | ICD-10-CM | POA: Diagnosis not present

## 2018-06-06 DIAGNOSIS — I129 Hypertensive chronic kidney disease with stage 1 through stage 4 chronic kidney disease, or unspecified chronic kidney disease: Secondary | ICD-10-CM | POA: Diagnosis not present

## 2018-06-06 DIAGNOSIS — F200 Paranoid schizophrenia: Secondary | ICD-10-CM | POA: Diagnosis not present

## 2018-06-06 DIAGNOSIS — Z7901 Long term (current) use of anticoagulants: Secondary | ICD-10-CM | POA: Diagnosis not present

## 2018-06-06 DIAGNOSIS — Z7984 Long term (current) use of oral hypoglycemic drugs: Secondary | ICD-10-CM | POA: Diagnosis not present

## 2018-06-06 DIAGNOSIS — G8193 Hemiplegia, unspecified affecting right nondominant side: Secondary | ICD-10-CM | POA: Diagnosis not present

## 2018-06-06 DIAGNOSIS — E1122 Type 2 diabetes mellitus with diabetic chronic kidney disease: Secondary | ICD-10-CM | POA: Diagnosis not present

## 2018-06-06 DIAGNOSIS — I890 Lymphedema, not elsewhere classified: Secondary | ICD-10-CM | POA: Diagnosis not present

## 2018-06-06 DIAGNOSIS — M1711 Unilateral primary osteoarthritis, right knee: Secondary | ICD-10-CM | POA: Diagnosis not present

## 2018-06-06 DIAGNOSIS — F0280 Dementia in other diseases classified elsewhere without behavioral disturbance: Secondary | ICD-10-CM | POA: Diagnosis not present

## 2018-06-06 DIAGNOSIS — E1165 Type 2 diabetes mellitus with hyperglycemia: Secondary | ICD-10-CM | POA: Diagnosis not present

## 2018-06-06 DIAGNOSIS — B91 Sequelae of poliomyelitis: Secondary | ICD-10-CM | POA: Diagnosis not present

## 2018-06-06 DIAGNOSIS — J449 Chronic obstructive pulmonary disease, unspecified: Secondary | ICD-10-CM | POA: Diagnosis not present

## 2018-06-06 DIAGNOSIS — Z6841 Body Mass Index (BMI) 40.0 and over, adult: Secondary | ICD-10-CM | POA: Diagnosis not present

## 2018-06-06 DIAGNOSIS — N183 Chronic kidney disease, stage 3 (moderate): Secondary | ICD-10-CM | POA: Diagnosis not present

## 2018-06-06 DIAGNOSIS — E039 Hypothyroidism, unspecified: Secondary | ICD-10-CM | POA: Diagnosis not present

## 2018-06-06 NOTE — Telephone Encounter (Signed)
Abigail Wallace wanted to let MD know that pt is doing well and has been discharged from home health. Christen Bame, CMA

## 2018-06-07 ENCOUNTER — Other Ambulatory Visit: Payer: Self-pay | Admitting: Cardiovascular Disease

## 2018-06-10 ENCOUNTER — Other Ambulatory Visit: Payer: Self-pay | Admitting: *Deleted

## 2018-06-10 MED ORDER — SODIUM BICARBONATE 650 MG PO TABS: 650.0000 mg | ORAL_TABLET | Freq: Two times a day (BID) | ORAL | 0 refills | Status: DC

## 2018-06-11 ENCOUNTER — Other Ambulatory Visit: Payer: Self-pay

## 2018-06-11 NOTE — Telephone Encounter (Signed)
Patient's original request was for Baclofen and sodium bicarbonate.  It appears that Baclofen was not refilled.  Patient was home alone when I called to verify and I was not able to speak to her son.  Patient states that she needs Baclofen which looks as though it was discontinued.  Patient is awaiting pharmacy to deliver her sodium bicarbonate.  Patient also states that her diabetes medication has been tasting sweet for the last couple of weeks where it has not been before.  Patient is wondering "if there is something wrong?"  .Ozella Almond, CMA

## 2018-06-11 NOTE — Telephone Encounter (Signed)
Pt calling for medication refill for: Sodium bicarbonate Baclofen

## 2018-06-11 NOTE — Telephone Encounter (Signed)
Refilled yesterday--- please call patient or son and confirm they have Rx. Thanks!

## 2018-06-12 ENCOUNTER — Encounter: Payer: Self-pay | Admitting: Cardiology

## 2018-06-12 NOTE — Telephone Encounter (Signed)
Called family regarding baclofen. Abigail Wallace takes baclofen every night for the past few weeks--helping to sleep. They have stopped using voltaren gel. Discussed and recommended stopping Baclofen. Also answered questions about bowel movements (frequency of Miralax), sodium bicarbonate (twice a day).   Patient smoking more--- she has some stress recently. Smoking 3-5 cigarettes per day. Encouraged cessation.  [name], MD  Family Medicine Teaching Service

## 2018-06-12 NOTE — Telephone Encounter (Signed)
Pt son returned call.  He states that pt recently started taking baclofen again and will need a refill. Christen Bame, CMA

## 2018-06-13 ENCOUNTER — Ambulatory Visit (INDEPENDENT_AMBULATORY_CARE_PROVIDER_SITE_OTHER): Payer: Medicare Other | Admitting: *Deleted

## 2018-06-13 DIAGNOSIS — I495 Sick sinus syndrome: Secondary | ICD-10-CM

## 2018-06-14 LAB — CUP PACEART REMOTE DEVICE CHECK
Battery Impedance: 205 Ohm
Battery Remaining Longevity: 109 mo
Battery Voltage: 2.79 V
Brady Statistic AP VP Percent: 46 %
Brady Statistic AP VS Percent: 0 %
Brady Statistic AS VP Percent: 54 %
Brady Statistic AS VS Percent: 0 %
Date Time Interrogation Session: 20200529002250
Implantable Lead Implant Date: 20070414
Implantable Lead Implant Date: 20070914
Implantable Lead Location: 753859
Implantable Lead Location: 753860
Implantable Lead Model: 4092
Implantable Lead Model: 5594
Implantable Pulse Generator Implant Date: 20160816
Lead Channel Impedance Value: 515 Ohm
Lead Channel Impedance Value: 599 Ohm
Lead Channel Pacing Threshold Amplitude: 0.625 V
Lead Channel Pacing Threshold Amplitude: 0.75 V
Lead Channel Pacing Threshold Pulse Width: 0.4 ms
Lead Channel Pacing Threshold Pulse Width: 0.4 ms
Lead Channel Setting Pacing Amplitude: 2 V
Lead Channel Setting Pacing Amplitude: 2.5 V
Lead Channel Setting Pacing Pulse Width: 0.4 ms
Lead Channel Setting Sensing Sensitivity: 4 mV

## 2018-06-18 ENCOUNTER — Encounter: Payer: Self-pay | Admitting: Cardiology

## 2018-06-18 NOTE — Progress Notes (Signed)
Remote pacemaker transmission.   

## 2018-06-19 ENCOUNTER — Telehealth: Payer: Self-pay | Admitting: Family Medicine

## 2018-06-19 NOTE — Telephone Encounter (Signed)
Received note from [facility].  Nursing- they need office note from 03/03/18 (90 days prior to start of care).   Please print this visit note and fax with form to [facility].  Thanks!  [name], MD  Family Medicine Teaching Service

## 2018-06-20 NOTE — Telephone Encounter (Signed)
Abigail Wallace has printed the notes from 03/03/2018 and has faxed it along with the form to [facility].  Abigail Wallace, Abigail Wallace

## 2018-06-23 ENCOUNTER — Encounter: Payer: Self-pay | Admitting: Family Medicine

## 2018-06-23 ENCOUNTER — Other Ambulatory Visit: Payer: Self-pay

## 2018-06-23 ENCOUNTER — Telehealth (INDEPENDENT_AMBULATORY_CARE_PROVIDER_SITE_OTHER): Payer: Medicare Other | Admitting: Family Medicine

## 2018-06-23 DIAGNOSIS — H2513 Age-related nuclear cataract, bilateral: Secondary | ICD-10-CM | POA: Diagnosis not present

## 2018-06-23 DIAGNOSIS — K5904 Chronic idiopathic constipation: Secondary | ICD-10-CM

## 2018-06-23 DIAGNOSIS — N3000 Acute cystitis without hematuria: Secondary | ICD-10-CM | POA: Diagnosis not present

## 2018-06-23 DIAGNOSIS — D72829 Elevated white blood cell count, unspecified: Secondary | ICD-10-CM

## 2018-06-23 DIAGNOSIS — H5703 Miosis: Secondary | ICD-10-CM | POA: Diagnosis not present

## 2018-06-23 DIAGNOSIS — E119 Type 2 diabetes mellitus without complications: Secondary | ICD-10-CM | POA: Diagnosis not present

## 2018-06-23 DIAGNOSIS — E1165 Type 2 diabetes mellitus with hyperglycemia: Secondary | ICD-10-CM

## 2018-06-23 DIAGNOSIS — N1832 Chronic kidney disease, stage 3b: Secondary | ICD-10-CM

## 2018-06-23 MED ORDER — CEPHALEXIN 250 MG PO CAPS: 250.0000 mg | ORAL_CAPSULE | Freq: Three times a day (TID) | ORAL | 0 refills | Status: DC

## 2018-06-23 MED ORDER — SODIUM BICARBONATE 650 MG PO TABS: 650.0000 mg | ORAL_TABLET | Freq: Two times a day (BID) | ORAL | 3 refills | Status: DC

## 2018-06-23 NOTE — Progress Notes (Signed)
[facility] Telemedicine Visit  Patient consented to have virtual visit. Method of visit: Telephone  Encounter participants: Patient: Abigail Wallace - located at home Provider: Martyn Malay - located at [facility] Others (if applicable): son Abigail Wallace)  Chief Complaint: worried about weight, questions about medications  HPI: [name] is a pleasant 73 year old woman with history of obesity, type 2 diabetes which is now under better control, hypertension, tobacco abuse, obesity and mood disorder presenting via telephone only visit with her son for follow-up.  Video was not used due to technical difficulties at home.  Diabetes The patient is currently taking glipizide 5 mg twice per day and linagliptin.  Her morning blood glucose ranges from 113-147.  No polyuria or polydipsia.  She is really been trying to work on her diet---no sodas or juice.  She has been more active recently.  Tobacco abuse The patient is under considerable stress with her family.  She is smoking 5 cigarettes per day.  She went out of her house in a wheelchair last week and got caught in the rain.  Her son is a bit upset about this.  She is determined to cut back to 2 to 3 cigarettes/day.  We discussed the benefits of quitting at length.  Constipation The patient has had a bowel movement every 1 to 2 days for the last week.  She is taking MiraLAX once a day.  They have not yet heard from [facility] gastroenterology regarding her colonoscopy  Dysuria The patient reports of 3-day history of burning when urinating.  She also endorses some mild back pain.  She had an episode of nausea yesterday.  No fevers, vomiting, hematuria.  She has had no vaginal discharge.  She is certain that this is a urinary tract infection.  ROS: per HPI  Pertinent PMHx:  Tobacco abuse Type 2 diabetes HTN Obesity Mood disorder, including anxiety   Exam:  Vitals Temp 97. 6 BP: 123-152/58-76 SpO2: 96-97%  Pulse  67-74 bpm  Respiratory: Speaking in full sentences, no distress, talkative.  Assessment/Plan:  Diagnoses and all orders for this visit:  Suspected acute cystitis discussed the option to bring a urine specimen to our clinic for culture.  The patient and her son would like empiric treatment.  Reviewed strict return precautions -     cephALEXin (KEFLEX) 250 MG capsule; Take 1 capsule (250 mg total) by mouth 3 (three) times daily.  Type 2 diabetes mellitus with hyperglycemia, without long-term current use of insulin (HCC) currently at goal no changes  Leukocytosis, unspecified type the patient has leukocytosis on her last CBC in February.  We will need to repeat that this summer.  Recommended an office visit in 1 month so that we can repeat this  CKD stage IIIb  -     sodium bicarbonate 650 MG tablet; Take 1 tablet (650 mg total) by mouth 2 (two) times daily.- refilled only   Tobacco Abuse discussed at length.   Constipation, recommended continuing once daily MiraLAX.  Provided number to gastroenterology. Son Abigail Wallace) will call to schedule.   At follow up - BMP - TSH - CBC with differential   Time spent during visit with patient: 18 minutes

## 2018-06-30 DIAGNOSIS — N3020 Other chronic cystitis without hematuria: Secondary | ICD-10-CM | POA: Diagnosis not present

## 2018-07-01 ENCOUNTER — Telehealth: Payer: Self-pay | Admitting: Family Medicine

## 2018-07-01 NOTE — Telephone Encounter (Signed)
Healthcare provider verification form dropped off for at front desk for completion.  Verified that patient section of form has been completed.  Last DOS/WCC with PCP was [phone]  Placed form in team folder to be completed by clinical staff.  Crista Luria

## 2018-07-01 NOTE — Telephone Encounter (Signed)
Reviewed form and placed in PCP's box for completion.  .River Ambrosio R Jael Waldorf, CMA  

## 2018-07-02 ENCOUNTER — Encounter: Payer: Self-pay | Admitting: Family Medicine

## 2018-07-03 ENCOUNTER — Encounter: Payer: Self-pay | Admitting: Family Medicine

## 2018-07-03 ENCOUNTER — Other Ambulatory Visit: Payer: Self-pay

## 2018-07-03 MED ORDER — PRAVASTATIN SODIUM 40 MG PO TABS: 40.0000 mg | ORAL_TABLET | Freq: Every evening | ORAL | 2 refills | Status: DC

## 2018-07-03 NOTE — Progress Notes (Signed)
Received labs from Nephrology visit.  WBC 14.4  Creatinine 1.7   Scanned into chart.  Needs repeat CBC with differential at follow up. [name], MD  Family Medicine Teaching Service

## 2018-07-04 NOTE — Telephone Encounter (Signed)
Called patient regarding paperwork, requested call back on Tuesday AM around [place]  [name], MD  [facility] Medicine Teaching Service

## 2018-07-07 ENCOUNTER — Encounter: Payer: Self-pay | Admitting: Family Medicine

## 2018-07-08 NOTE — Telephone Encounter (Signed)
Attempted to call patient regarding paperwork.  [name], MD  Family Medicine Teaching Service

## 2018-07-08 NOTE — Telephone Encounter (Signed)
Pt son is returning Dr. Saul Fordyce call and would like her to call him back when she gets a chance.

## 2018-07-08 NOTE — Telephone Encounter (Addendum)
Completed form for caregiver to be in home to help with medications and ADLS. Patient requires assistance with all medications and with ADLs throughout the day the patient was recently seen by ophthalmology on 8 June.  She has no retinopathy.  She did have cataracts and may be undergoing surgery.  She would like to consider this and is pursuing this further. Patient was also recently seen by her urologist who recommended recommended D- mannose, vitamin C, Cranberry and probiotic.  We discussed the limited evidence for these interventions.  She is also concerned about the cost.  Particularly the d-mannose is $40 or more a month.  We weighed the risks and benefits.  The patient opted to only take the cranberry.  All questions answered. Scheduled for virtual visit (which may be changed to office pending COVID) at end of July. Will need A1C at later this summer.  [name], MD  Family Medicine Teaching Service

## 2018-07-21 ENCOUNTER — Other Ambulatory Visit: Payer: Self-pay

## 2018-07-21 MED ORDER — ANORO ELLIPTA 62.5-25 MCG/INH IN AEPB: 1.0000 | INHALATION_SPRAY | Freq: Every day | RESPIRATORY_TRACT | 5 refills | Status: DC

## 2018-07-22 ENCOUNTER — Ambulatory Visit: Payer: Medicare Other | Admitting: Gastroenterology

## 2018-07-29 ENCOUNTER — Encounter: Payer: Self-pay | Admitting: Sports Medicine

## 2018-07-29 ENCOUNTER — Ambulatory Visit (INDEPENDENT_AMBULATORY_CARE_PROVIDER_SITE_OTHER): Payer: Medicare Other | Admitting: Sports Medicine

## 2018-07-29 ENCOUNTER — Ambulatory Visit: Payer: Medicare Other

## 2018-07-29 ENCOUNTER — Other Ambulatory Visit: Payer: Self-pay

## 2018-07-29 VITALS — BP 143/73 | HR 76 | Temp 97.6°F | Resp 16

## 2018-07-29 DIAGNOSIS — M79671 Pain in right foot: Secondary | ICD-10-CM

## 2018-07-29 DIAGNOSIS — M79675 Pain in left toe(s): Secondary | ICD-10-CM

## 2018-07-29 DIAGNOSIS — E1142 Type 2 diabetes mellitus with diabetic polyneuropathy: Secondary | ICD-10-CM

## 2018-07-29 DIAGNOSIS — B351 Tinea unguium: Secondary | ICD-10-CM

## 2018-07-29 DIAGNOSIS — M79674 Pain in right toe(s): Secondary | ICD-10-CM

## 2018-07-29 DIAGNOSIS — I2510 Atherosclerotic heart disease of native coronary artery without angina pectoris: Secondary | ICD-10-CM | POA: Diagnosis not present

## 2018-07-29 NOTE — Progress Notes (Addendum)
Subjective: Abigail Wallace is a 73 y.o. female patient with history of diabetes who presents to office today complaining of long,mildly painful nails; unable to trim. Patient states that the glucose reading this morning was 121 mg/dl.  Last A1c of 6.2.  Patient denies any new changes in medication or new problems.  Patient is assisted by son who helps to report this history.  Review of Systems  All other systems reviewed and are negative.  Patient Active Problem List   Diagnosis Date Noted  . Constipation 05/26/2018  . Leukocytosis 03/03/2018  . Pulmonary nodules 03/03/2018  . Morbid obesity ([place]) 02/07/2018  . Paroxysmal atrial fibrillation ([place]) 01/28/2018  . Dependence on wheelchair 05/17/2017  . Renal cyst 01/16/2017  . Long-term use of high-risk medication 10/11/2016  . CKD stage IIIb 09/12/2016  . Subclinical hypothyroidism 04/27/2015  . DUB (dysfunctional uterine bleeding) 02/22/2015  . Tobacco use disorder 05/10/2014  . Memory difficulties 12/02/2013  . Tardive dyskinesia 12/02/2013  . Essential hypertension 04/22/2013  . Pressure sore on buttocks 04/10/2012  . Type 2 diabetes mellitus with hyperglycemia ([place]) 03/20/2012  . Gout 09/26/2011  . Vaginal prolapse 03/27/2010  . Sleep apnea 03/27/2010  . Osteoarthritis of right knee 03/27/2010  . Lymphedema of left leg 02/17/2010  . Resting tremor 12/15/2009  . Sick sinus syndrome ([place]) 04/24/2006  . Mixed hyperlipidemia 03/14/2006  . Paranoid schizophrenia ([place]) 03/14/2006  . COPD 03/14/2006   Current Outpatient Medications on File Prior to Visit  Medication Sig Dispense Refill  . acetaminophen (TYLENOL) 500 MG tablet Take 1,000 mg by mouth at bedtime.     Marland Kitchen albuterol (PROVENTIL HFA;VENTOLIN HFA) 108 (90 Base) MCG/ACT inhaler Inhale 2 puffs into the lungs every 6 (six) hours as needed for wheezing or shortness of breath. 1 Inhaler 2  . albuterol (PROVENTIL) (2.5 MG/3ML) 0.083% nebulizer solution Take 3 mLs (2.5 mg total)  by nebulization every 6 (six) hours as needed for wheezing or shortness of breath. 150 mL 1  . allopurinol (ZYLOPRIM) 100 MG tablet TAKE ONE TABLET BY MOUTH DAILY 30 tablet 6  . amLODipine (NORVASC) 10 MG tablet Take 1 tablet (10 mg total) by mouth daily. 90 tablet 3  . apixaban (ELIQUIS) 5 MG TABS tablet Take 1 tablet (5 mg total) by mouth 2 (two) times daily. 60 tablet 11  . Blood Glucose Monitoring Suppl (ONETOUCH VERIO) w/Device KIT 1 Device by Does not apply route 3 (three) times a week. 1 kit 1  . cephALEXin (KEFLEX) 250 MG capsule Take 1 capsule (250 mg total) by mouth 3 (three) times daily. 15 capsule 0  . diclofenac sodium (VOLTAREN) 1 % GEL Apply 4 g topically 4 (four) times daily as needed (joint pain). 200 g 2  . divalproex (DEPAKOTE ER) 250 MG 24 hr tablet Take 20 mg by mouth at bedtime.    Marland Kitchen glipiZIDE (GLUCOTROL) 5 MG tablet Take 1 tablet (5 mg total) by mouth 2 (two) times daily before a meal. 180 tablet 3  . glucose blood test strip Test each morning before breakfast 100 each 12  . Incontinence Supplies MISC 1 Units by Does not apply route as needed. 100 each prn  . Lancet Device MISC 1 Device by Does not apply route 3 (three) times a week. 1 each 11  . linagliptin (TRADJENTA) 5 MG TABS tablet Take 1 tablet (5 mg total) by mouth daily. 90 tablet 3  . losartan (COZAAR) 100 MG tablet TAKE ONE TABLET BY MOUTH DAILY 90 tablet 1  .  metoprolol tartrate (LOPRESSOR) 50 MG tablet TAKE ONE TABLET BY MOUTH TWICE A DAY 60 tablet 6  . nitroGLYCERIN (NITROSTAT) 0.4 MG SL tablet Place 1 tablet (0.4 mg total) under the tongue every 5 (five) minutes x 3 doses as needed. For chest 25 tablet 1  . nystatin cream (MYCOSTATIN) Apply 1 application topically 2 (two) times daily. 30 g 3  . OLANZapine (ZYPREXA) 10 MG tablet Take 10 mg by mouth at bedtime.  0  . OneTouch Delica Lancets 93G MISC Use to test once daily.  DX code:E11.9 100 each 3  . pravastatin (PRAVACHOL) 40 MG tablet Take 1 tablet (40 mg  total) by mouth every evening. 90 tablet 2  . QUEtiapine (SEROQUEL) 50 MG tablet Take 1 tablet (50 mg total) by mouth at bedtime. 90 tablet 3  . senna (SENOKOT) 8.6 MG TABS tablet Take 1 tablet (8.6 mg total) by mouth daily as needed for mild constipation. 10 each 0  . sodium bicarbonate 650 MG tablet Take 1 tablet (650 mg total) by mouth 2 (two) times daily. 180 tablet 3  . umeclidinium-vilanterol (ANORO ELLIPTA) 62.5-25 MCG/INH AEPB Inhale 1 puff into the lungs daily. 1 each 5   No current facility-administered medications on file prior to visit.    Allergies  Allergen Reactions  . Abilify [Aripiprazole] Anaphylaxis, Shortness Of Breath and Other (See Comments)    Tremors  . Clozapine Anaphylaxis  . Benadryl [Diphenhydramine Hcl] Other (See Comments)    States affects her vision  . Cogentin [Benztropine] Other (See Comments)    Affects mobility  . Haloperidol Lactate Other (See Comments)    Vision changes, Shaking, Drooling  . Latuda [Lurasidone Hcl] Other (See Comments)    "Tremors and shakes."   . Remeron [Mirtazapine] Other (See Comments)    Tremors and shakes  . Codeine Other (See Comments)    Unknown   . Latex Rash    Recent Results (from the past 2160 hour(s))  CUP PACEART REMOTE DEVICE CHECK     Status: None   Collection Time: 06/13/18 12:22 AM  Result Value Ref Range   Date Time Interrogation Session [id number]    Pulse Generator Manufacturer MERM    Pulse Gen Model [id number] Adapta    Pulse Gen Serial Number [id number]    Clinic Name [facility] Pulse Generator Type Implantable Pulse Generator    Implantable Pulse Generator Implant Date [id number]    Implantable Lead Manufacturer MERM    Implantable Lead Model 4092 CapSure SP Novus    Implantable Lead Serial Number [id number]    Implantable Lead Implant Date 52778242    Implantable Lead Location Detail 1 APEX    Implantable Lead Location [id number]    Implantable Lead Manufacturer MERM     Implantable Lead Model 5594 CapSure SP Novus    Implantable Lead Serial Number [id number]    Implantable Lead Implant Date [id number]    Implantable Lead Location [id number]    Lead Channel Setting Sensing Sensitivity 4.00 mV   Lead Channel Setting Pacing Amplitude 2.000 V   Lead Channel Setting Pacing Pulse Width 0.40 ms   Lead Channel Setting Pacing Amplitude 2.500 V   Lead Channel Impedance Value 515 ohm   Lead Channel Pacing Threshold Amplitude 0.625 V   Lead Channel Pacing Threshold Pulse Width 0.40 ms   Lead Channel Impedance Value 599 ohm   Lead Channel Pacing Threshold Amplitude 0.750 V   Lead Channel Pacing Threshold Pulse Width 0.40 ms  Battery Status OK    Battery Remaining Longevity 109 mo   Battery Voltage 2.79 V   Battery Impedance 205 ohm   Brady Statistic AP VP Percent 46 %   Brady Statistic AS VP Percent 54 %   Brady Statistic AP VS Percent 0 %   Brady Statistic AS VS Percent 0 %    Objective: General: Patient is awake, alert, and oriented x 2 and in no acute distress.  Integument: Skin is warm, dry and supple bilateral. Nails are tender, long, thickened and  dystrophic with subungual debris, consistent with onychomycosis, 1-5 bilateral. No signs of infection. No open lesions or preulcerative lesions present bilateral. Remaining integument unremarkable.  Vasculature:  Dorsalis Pedis pulse 1/4 bilateral. Posterior Tibial pulse  1/4 bilateral.  Capillary fill time <3 sec 1-5 bilateral. Positive hair growth to the level of the digits. Temperature gradient within normal limits. No varicosities present bilateral. No edema present bilateral.   Neurology: The patient has intact sensation measured with a 5.07/10g Semmes Weinstein Monofilament at all pedal sites bilateral . Vibratory sensation diminished bilateral with tuning fork. No Babinski sign present bilateral.   Musculoskeletal: Asymptomatic hammertoe pedal deformities noted bilateral. Muscular strength 5/5 in all lower  extremity muscular groups bilateral without pain on range of motion . No tenderness with calf compression bilateral.  Assessment and Plan: Problem List Items Addressed This Visit    None    Visit Diagnoses    Pain due to onychomycosis of toenails of both feet    -  Primary   Diabetic polyneuropathy associated with type 2 diabetes mellitus ([place])          -Examined patient. -Discussed and educated patient on diabetic foot care, especially with  regards to the vascular, neurological and musculoskeletal systems.  -Stressed the importance of good glycemic control and the detriment of not  controlling glucose levels in relation to the foot. -ABN signed -Mechanically debrided all nails 1-5 bilateral using sterile nail nipper and filed with dremel without incident  -Answered all patient's sons questions -Patient to return  in 3 months for at risk foot care -Patient advised to call the office if any problems or questions arise in the meantime.  Landis Martins, DPM

## 2018-07-30 ENCOUNTER — Telehealth: Payer: Self-pay

## 2018-07-30 ENCOUNTER — Telehealth: Payer: Self-pay | Admitting: Cardiology

## 2018-07-30 NOTE — Telephone Encounter (Signed)
Please give orders for Hemoglobin A1C, CBC with differential, basic metabolic panel.  Thanks! [name], MD  Family Medicine Teaching Service

## 2018-07-30 NOTE — Telephone Encounter (Signed)
Left message for shelly to call

## 2018-07-30 NOTE — Telephone Encounter (Signed)
Patient calls nurse line stating she has an apt with her Cardiologist, Dr. Stanford Breed, on Monday, July 20th. Patient wants you to call them and give verbal orders for any labs you may want her to have. Patient stated this way she doesn't have to go to "several" places.   Patient stated she spoke with Josh at her Cardiologists office, you can call him there with verbals. [phone].

## 2018-07-31 NOTE — Telephone Encounter (Signed)
New Message     Abigail Wallace is calling back and says a verbal order for Hemoglobin, CBC and a BMP

## 2018-07-31 NOTE — Telephone Encounter (Signed)
Verbal order given to Marissa for labs to be drawn.  Ozella Almond, [place]

## 2018-07-31 NOTE — Telephone Encounter (Signed)
Left message for Darrick Penna to return call to clarify the order.

## 2018-08-01 ENCOUNTER — Telehealth: Payer: Self-pay | Admitting: *Deleted

## 2018-08-01 NOTE — Telephone Encounter (Signed)
Spoke with shelly, they would like a HgbA1c, cbc with diff and bmp drawn at her appointment here Monday 08-04-18. Will make dr croitoru's nurse lisa aware.

## 2018-08-01 NOTE — Telephone Encounter (Signed)
Noted thank you

## 2018-08-01 NOTE — Telephone Encounter (Signed)
Spoke to [employer] and clarified verbal orders.   Abigail Wallace, [place]

## 2018-08-01 NOTE — Telephone Encounter (Signed)
Left a message to call back for covid prescreening for appointment on 08/04/2018

## 2018-08-04 ENCOUNTER — Encounter: Payer: Medicare Other | Admitting: Cardiovascular Disease

## 2018-08-04 ENCOUNTER — Ambulatory Visit (INDEPENDENT_AMBULATORY_CARE_PROVIDER_SITE_OTHER): Payer: Medicare Other | Admitting: Cardiovascular Disease

## 2018-08-04 ENCOUNTER — Other Ambulatory Visit: Payer: Self-pay

## 2018-08-04 ENCOUNTER — Encounter: Payer: Self-pay | Admitting: Cardiovascular Disease

## 2018-08-04 VITALS — BP 135/73 | HR 75 | Temp 97.2°F | Ht 66.0 in | Wt 256.6 lb

## 2018-08-04 DIAGNOSIS — R739 Hyperglycemia, unspecified: Secondary | ICD-10-CM | POA: Diagnosis not present

## 2018-08-04 DIAGNOSIS — E1169 Type 2 diabetes mellitus with other specified complication: Secondary | ICD-10-CM | POA: Diagnosis not present

## 2018-08-04 DIAGNOSIS — I5032 Chronic diastolic (congestive) heart failure: Secondary | ICD-10-CM | POA: Diagnosis not present

## 2018-08-04 DIAGNOSIS — I10 Essential (primary) hypertension: Secondary | ICD-10-CM | POA: Diagnosis not present

## 2018-08-04 DIAGNOSIS — E669 Obesity, unspecified: Secondary | ICD-10-CM

## 2018-08-04 DIAGNOSIS — I480 Paroxysmal atrial fibrillation: Secondary | ICD-10-CM | POA: Diagnosis not present

## 2018-08-04 DIAGNOSIS — I2510 Atherosclerotic heart disease of native coronary artery without angina pectoris: Secondary | ICD-10-CM | POA: Diagnosis not present

## 2018-08-04 DIAGNOSIS — D649 Anemia, unspecified: Secondary | ICD-10-CM | POA: Diagnosis not present

## 2018-08-04 DIAGNOSIS — Z993 Dependence on wheelchair: Secondary | ICD-10-CM

## 2018-08-04 DIAGNOSIS — E782 Mixed hyperlipidemia: Secondary | ICD-10-CM | POA: Diagnosis not present

## 2018-08-04 DIAGNOSIS — I442 Atrioventricular block, complete: Secondary | ICD-10-CM | POA: Diagnosis not present

## 2018-08-04 DIAGNOSIS — I495 Sick sinus syndrome: Secondary | ICD-10-CM

## 2018-08-04 NOTE — Progress Notes (Signed)
Patient ID: Abigail Wallace, female   DOB: [birth year], 73 y.o.   MRN: [id number]    Cardiology Office Note    Date:  08/04/2018   ID:  [address] [place], [place] [birth year], MRN [id number]  PCP:  Abigail Malay, MD  Cardiologist:   Abigail Klein, MD   No chief complaint on file.   History of Present Illness:  Abigail Wallace is a 73 y.o. female with complete heart block and sinus node dysfunction who presents for atrial fibrillation and a pacemaker check. As always, her son Abigail Wallace accompanies her today.  Her episodes of paroxysmal atrial fibrillation are very infrequent.  In the last 3 months she is only had one episode but this went on for 5 days in early May.  She was unaware of it.  She has been working hard on losing weight, together with her son.  She has lost about 40 pounds since the beginning of the year.  She reports that her hemoglobin A1c is now in the 5-6% range (it was 10.5% in January).  She continues to smoke 4 cigarettes a day.  Her son Abigail Wallace her to quit completely, but she really enjoys it.  This sounds like this is a behavioral addiction and she looks to smoking a cigarette as a reward.  The patient specifically denies any chest pain at rest exertion, dyspnea at rest or with exertion, orthopnea, paroxysmal nocturnal dyspnea, syncope, palpitations, focal neurological deficits, intermittent claudication, lower extremity edema, unexplained weight gain, cough, hemoptysis or wheezing.  She spends most of her day in a wheelchair, but is getting stronger in her legs.  Her pacemaker is a dual-chamber Medtronic Adapta device implanted in August 2016 as a generator change out. She is pacemaker dependent due to complete heart block (she has an idioventricular escape rhythm around 30 bpm). She also has significant sinus bradycardia.  Device interrogation today shows expected of 100% ventricular pacing and 50% atrial pacing.  As mentioned she had a 5-day episode of atrial fibrillation on May  3  She initially received a dual-chamber permanent pacemaker in 2007 for symptomatic AV block. She has minimal coronary atherosclerosis by cardiac catheterization performed in 2007 and no evidence of insufficiency by nuclear perfusion testing in 2012. By echocardiography she has normal left ventricular size and systolic function and no major structural cardiac abnormalities.   Noncardiac history includes chronic back pain, obesity and orthostatic hypotension. She smokes roughly 5 cigarettes a day and has been diagnosed with COPD, but I don't think this has been formally documented. There is also a mention of a diagnosis of obstructive sleep apnea in her chart. Use CPAP. She has a history of schizophrenia with psychosis requiring hospitalization in 2012. Since then, she has done well and lives independently. Her son, Abigail Wallace, accompanies her to most visits.   Past Medical History:  Diagnosis Date   Atrial fibrillation ([place])    COPD (chronic obstructive pulmonary disease) ([place])    Coronary artery disease    NON-CRITICAL   GERD (gastroesophageal reflux disease)    Gout    Hypertension    Memory difficulties 12/02/2013   Mental disorder    Psychosis ([place])    Tardive dyskinesia 12/02/2013   Type 2 diabetes mellitus [facility])     Past Surgical History:  Procedure Laterality Date   CARDIAC CATHETERIZATION  2007   Non-critical.    CHOLECYSTECTOMY     EP IMPLANTABLE DEVICE N/A 08/31/2014   Procedure: PPM Generator Changeout;  Surgeon: Abigail Klein, MD;  Location: [facility];  Service: Cardiovascular;  Laterality: N/A;   INSERT / REPLACE / REMOVE PACEMAKER  2007   [place]/SYMPTOMATIC HEART BLOCK   PACEMAKER PLACEMENT  09/28/2005   2/2 SSS?   Persantine stress test  05/01/2010   EF 66%. Normal LV sys fx. Unchanged from previous studies.    TRANSTHORACIC ECHOCARDIOGRAM  09/08/10   SEVERE CONCENTRIC HYPERTROPHY.LV FUNCTION WAS VIGOROUS.EF 65%-70%.VENTRICULAR  SEPTUM-INCOORDINATE MOTION.LEFT ATRIUM-MILDLY DILATED.TRIVIAL TR.   TUBAL LIGATION      Outpatient Medications Prior to Visit  Medication Sig Dispense Refill   acetaminophen (TYLENOL) 500 MG tablet Take 1,000 mg by mouth at bedtime.      albuterol (PROVENTIL HFA;VENTOLIN HFA) 108 (90 Base) MCG/ACT inhaler Inhale 2 puffs into the lungs every 6 (six) hours as needed for wheezing or shortness of breath. 1 Inhaler 2   albuterol (PROVENTIL) (2.5 MG/3ML) 0.083% nebulizer solution Take 3 mLs (2.5 mg total) by nebulization every 6 (six) hours as needed for wheezing or shortness of breath. 150 mL 1   allopurinol (ZYLOPRIM) 100 MG tablet TAKE ONE TABLET BY MOUTH DAILY 30 tablet 6   amLODipine (NORVASC) 10 MG tablet Take 1 tablet (10 mg total) by mouth daily. 90 tablet 3   apixaban (ELIQUIS) 5 MG TABS tablet Take 1 tablet (5 mg total) by mouth 2 (two) times daily. 60 tablet 11   Blood Glucose Monitoring Suppl (ONETOUCH VERIO) w/Device KIT 1 Device by Does not apply route 3 (three) times a week. 1 kit 1   cephALEXin (KEFLEX) 250 MG capsule Take 1 capsule (250 mg total) by mouth 3 (three) times daily. 15 capsule 0   diclofenac sodium (VOLTAREN) 1 % GEL Apply 4 g topically 4 (four) times daily as needed (joint pain). 200 g 2   divalproex (DEPAKOTE ER) 250 MG 24 hr tablet Take 20 mg by mouth at bedtime.     glipiZIDE (GLUCOTROL) 5 MG tablet Take 1 tablet (5 mg total) by mouth 2 (two) times daily before a meal. 180 tablet 3   glucose blood test strip Test each morning before breakfast 100 each 12   Incontinence Supplies MISC 1 Units by Does not apply route as needed. 100 each prn   Lancet Device MISC 1 Device by Does not apply route 3 (three) times a week. 1 each 11   linagliptin (TRADJENTA) 5 MG TABS tablet Take 1 tablet (5 mg total) by mouth daily. 90 tablet 3   losartan (COZAAR) 100 MG tablet TAKE ONE TABLET BY MOUTH DAILY 90 tablet 1   metoprolol tartrate (LOPRESSOR) 50 MG tablet TAKE ONE  TABLET BY MOUTH TWICE A DAY 60 tablet 6   nitroGLYCERIN (NITROSTAT) 0.4 MG SL tablet Place 1 tablet (0.4 mg total) under the tongue every 5 (five) minutes x 3 doses as needed. For chest 25 tablet 1   nystatin cream (MYCOSTATIN) Apply 1 application topically 2 (two) times daily. 30 g 3   OLANZapine (ZYPREXA) 10 MG tablet Take 10 mg by mouth at bedtime.  0   OneTouch Delica Lancets 54S MISC Use to test once daily.  DX code:E11.9 100 each 3   pravastatin (PRAVACHOL) 40 MG tablet Take 1 tablet (40 mg total) by mouth every evening. 90 tablet 2   QUEtiapine (SEROQUEL) 50 MG tablet Take 1 tablet (50 mg total) by mouth at bedtime. 90 tablet 3   senna (SENOKOT) 8.6 MG TABS tablet Take 1 tablet (8.6 mg total) by mouth daily as needed for mild constipation.  10 each 0   sodium bicarbonate 650 MG tablet Take 1 tablet (650 mg total) by mouth 2 (two) times daily. 180 tablet 3   umeclidinium-vilanterol (ANORO ELLIPTA) 62.5-25 MCG/INH AEPB Inhale 1 puff into the lungs daily. 1 each 5   No facility-administered medications prior to visit.      Allergies:   Abilify [aripiprazole], Clozapine, Benadryl [diphenhydramine hcl], Cogentin [benztropine], Haloperidol lactate, Latuda [lurasidone hcl], Remeron [mirtazapine], Codeine, and Latex   Social History   Socioeconomic History   Marital status: Divorced    Spouse name: Not on file   Number of children: Not on file   Years of education: Not on file   Highest education level: Not on file  Occupational History   Not on file  Social Needs   Financial resource strain: Not on file   Food insecurity    Worry: Not on file    Inability: Not on file   Transportation needs    Medical: Not on file    Non-medical: Not on file  Tobacco Use   Smoking status: Current Some Day Smoker    Packs/day: 0.25    Types: Cigarettes   Smokeless tobacco: Never Used   Tobacco comment: 6 cigs a day  Substance and Sexual Activity   Alcohol use: No     Alcohol/week: 0.0 standard drinks   Drug use: No   Sexual activity: Not on file  Lifestyle   Physical activity    Days per week: Not on file    Minutes per session: Not on file   Stress: Not on file  Relationships   Social connections    Talks on phone: Not on file    Gets together: Not on file    Attends religious service: Not on file    Active member of club or organization: Not on file    Attends meetings of clubs or organizations: Not on file    Relationship status: Not on file  Other Topics Concern   Not on file  Social History Narrative   Lives alone. Son brings to appointments (Abigail Wallace)    Children: 2 daughters, 1 son; 65 GC, 5 GGC.    Occupation: disabled.    Caffeine:   Tobacco: 5-6/day   Denies alcohol.      Family History:  The patient's family history includes Diabetes type II in her son; Heart disease in her father and mother; Kidney disease in her father and mother; Stroke in her sister.   ROS:   Please see the history of present illness.    Review of Systems  Constitution: Negative for chills, decreased appetite and fever.  HENT: Negative for nosebleeds.   Eyes: Negative for visual disturbance.  Cardiovascular: Positive for leg swelling. Negative for chest pain, irregular heartbeat, orthopnea, palpitations and syncope.  Respiratory: Positive for snoring. Negative for cough, hemoptysis and wheezing.   Endocrine: Negative for cold intolerance, heat intolerance, polydipsia, polyphagia and polyuria.  Hematologic/Lymphatic: Does not bruise/bleed easily.  Skin: Negative for itching and rash.  Musculoskeletal: Positive for arthritis, back pain, joint pain, muscle weakness and neck pain.  Gastrointestinal: Negative for abdominal pain, change in bowel habit, hematemesis, melena, nausea and vomiting.  Genitourinary: Negative for frequency, hematuria, non-menstrual bleeding and urgency.  Neurological: Positive for difficulty with concentration and tremors. Negative  for headaches.  Psychiatric/Behavioral: Negative for depression and hallucinations.  Allergic/Immunologic: Negative.    All other systems reviewed and are negative.   PHYSICAL EXAM:   VS:  BP 135/73  Pulse 75    Temp (!) 97.2 F (36.2 C)    Ht '5\' 6"'  (1.676 m)    Wt 256 lb 9.6 oz (116.4 kg)    SpO2 97%    BMI 41.42 kg/m      General: Alert, oriented x3, no distress, morbidly obese Head: no evidence of trauma, PERRL, EOMI, no exophtalmos or lid lag, no myxedema, no xanthelasma; normal ears, nose and oropharynx Neck: normal jugular venous pulsations and no hepatojugular reflux; brisk carotid pulses without delay and no carotid bruits Chest: clear to auscultation, no signs of consolidation by percussion or palpation, normal fremitus, symmetrical and full respiratory excursions Cardiovascular: normal position and quality of the apical impulse, regular rhythm, normal first and paradoxically split second heart sounds, no murmurs, rubs or gallops.  Healthy left subclavian pacemaker site Abdomen: no tenderness or distention, no masses by palpation, no abnormal pulsatility or arterial bruits, normal bowel sounds, no hepatosplenomegaly Extremities: no clubbing, cyanosis or edema; 2+ radial, ulnar and brachial pulses bilaterally; 2+ right femoral, posterior tibial and dorsalis pedis pulses; 2+ left femoral, posterior tibial and dorsalis pedis pulses; no subclavian or femoral bruits Neurological: grossly nonfocal except for some facial and finger movement suggestive of tardive dyskinesia Psych: Normal mood and affect    Wt Readings from Last 3 Encounters:  08/04/18 256 lb 9.6 oz (116.4 kg)  03/03/18 279 lb (126.6 kg)  02/16/18 295 lb 3.1 oz (133.9 kg)      Studies/Labs Reviewed:   EKG:  EKG is ordered today.  Shows atrial sensed, ventricular paced rhythm with a slightly prolonged AV delay of 222 ms.  Broad paced QRS 142 ms, QTC 481 ms Recent Labs: 02/12/2018: TSH 4.840 02/13/2018: ALT  14 02/16/2018: BUN 28; Creatinine, Ser 1.84; Potassium 3.5; Sodium 140 03/03/2018: Hemoglobin 10.2; Platelets 299  Lipid Panel     Component Value Date/Time   CHOL 122 11/19/2010 0605   TRIG 73 11/19/2010 0605   HDL 45 11/19/2010 0605   CHOLHDL 2.7 11/19/2010 0605   VLDL 15 11/19/2010 0605   LDLCALC 62 11/19/2010 0605   LDLDIRECT 80 03/20/2012 1510     ASSESSMENT:    1. Paroxysmal atrial fibrillation (HCC)   2. Chronic diastolic heart failure ([place])   3. Essential hypertension   4. Hyperglycemia   5. Sick sinus syndrome ([place])   6. Heart block AV complete ([place])   7. Mixed hyperlipidemia   8. Dependence on wheelchair   9. Morbid obesity ([place])   10. Diabetes mellitus type 2 in obese [facility])      PLAN:  In order of problems listed above:  1. Afib: The episodes are infrequent and asymptomatic, but are very lengthy.  CHADSVasc 5 (age, DM, HTN, HF). 2. CHF: volume status is always difficult to assess due to her obesity, but she does not appear to be overtly hypervolemic today. 3. HTN: Control is fair 4. SSS: Roughly 50% atrial pacing, heart rate histogram distribution is appropriate considering how sedentary she has. 5. CHB: Many times, but not all the time she has a slow idioventricular escape rhythm. 6. PPM: Pacemaker dependent.  Normal device function.  Continue remote downloads every 3 months. 7. HLP: On statin, most recent lipid parameters were quite good. 8. Deconditioning: Some improvement but she still spends most of her day at wheelchair. 9. Obesity: Congratulated on substantial improvement, but she is still in the morbidly obese range 10. DM: She reports markedly improved glycemic control.  I cannot review her labs.  Medication Adjustments/Labs and Tests Ordered: Current medicines are reviewed at length with the patient today.  Concerns regarding medicines are outlined above.  Medication changes, Labs and Tests ordered today are listed in the Patient Instructions  below. Patient Instructions  Medication Instructions:  Your physician recommends that you continue on your current medications as directed. Please refer to the Current Medication list given to you today.  If you need a refill on your cardiac medications before your next appointment, please call your pharmacy.   Lab work: Your provider would like for you to have the following labs today: CBC, BMET and A1C  If you have labs (blood work) drawn today and your tests are completely normal, you will receive your results only by: [place] (if you have MyChart) OR A paper copy in the mail If you have any lab test that is abnormal or we need to change your treatment, we will call you to review the results.  Testing/Procedures: None ordered  Follow-Up: At [facility], you and your health needs are our priority.  As part of our continuing mission to provide you with exceptional heart care, we have created designated Provider Care Teams.  These Care Teams include your primary Cardiologist (physician) and Advanced Practice Providers (APPs -  Physician Assistants and Nurse Practitioners) who all work together to provide you with the care you need, when you need it. You will need a follow up appointment in 12 months.  Please call our office 2 months in advance to schedule this appointment.  You may see Abigail Klein, MD or one of the following Advanced Practice Providers on your designated Care Team: Almyra Deforest, PA-C Fabian Sharp, [place]            Signed, Abigail Klein, MD  08/04/2018 2:02 PM    [address] [address], [place], [place]  [address] Phone: [phone]; Fax: [phone]

## 2018-08-04 NOTE — Patient Instructions (Signed)
Medication Instructions:  Your physician recommends that you continue on your current medications as directed. Please refer to the Current Medication list given to you today.  If you need a refill on your cardiac medications before your next appointment, please call your pharmacy.   Lab work: Your provider would like for you to have the following labs today: CBC, BMET and A1C  If you have labs (blood work) drawn today and your tests are completely normal, you will receive your results only by: [place] (if you have MyChart) OR A paper copy in the mail If you have any lab test that is abnormal or we need to change your treatment, we will call you to review the results.  Testing/Procedures: None ordered  Follow-Up: At [facility], you and your health needs are our priority.  As part of our continuing mission to provide you with exceptional heart care, we have created designated Provider Care Teams.  These Care Teams include your primary Cardiologist (physician) and Advanced Practice Providers (APPs -  Physician Assistants and Nurse Practitioners) who all work together to provide you with the care you need, when you need it. You will need a follow up appointment in 12 months.  Please call our office 2 months in advance to schedule this appointment.  You may see Sanda Klein, MD or one of the following Advanced Practice Providers on your designated Care Team: Almyra Deforest, PA-C Fabian Sharp, [place]

## 2018-08-05 ENCOUNTER — Telehealth: Payer: Self-pay | Admitting: Family Medicine

## 2018-08-05 ENCOUNTER — Other Ambulatory Visit: Payer: Self-pay

## 2018-08-05 LAB — CBC
Hematocrit: 33.9 % — ABNORMAL LOW (ref 34.0–46.6)
Hemoglobin: 10.6 g/dL — ABNORMAL LOW (ref 11.1–15.9)
MCH: 24.8 pg — ABNORMAL LOW (ref 26.6–33.0)
MCHC: 31.3 g/dL — ABNORMAL LOW (ref 31.5–35.7)
MCV: 79 fL (ref 79–97)
Platelets: 315 10*3/uL (ref 150–450)
RBC: 4.27 x10E6/uL (ref 3.77–5.28)
RDW: 16.3 % — ABNORMAL HIGH (ref 11.7–15.4)
WBC: 14.3 10*3/uL — ABNORMAL HIGH (ref 3.4–10.8)

## 2018-08-05 LAB — BASIC METABOLIC PANEL
BUN/Creatinine Ratio: 15 (ref 12–28)
BUN: 26 mg/dL (ref 8–27)
CO2: 19 mmol/L — ABNORMAL LOW (ref 20–29)
Calcium: 9.6 mg/dL (ref 8.7–10.3)
Chloride: 107 mmol/L — ABNORMAL HIGH (ref 96–106)
Creatinine, Ser: 1.77 mg/dL — ABNORMAL HIGH (ref 0.57–1.00)
GFR calc Af Amer: 32 mL/min/{1.73_m2} — ABNORMAL LOW (ref >59)
GFR calc non Af Amer: 28 mL/min/{1.73_m2} — ABNORMAL LOW (ref >59)
Glucose: 97 mg/dL (ref 65–99)
Potassium: 4.2 mmol/L (ref 3.5–5.2)
Sodium: 141 mmol/L (ref 134–144)

## 2018-08-05 LAB — HEMOGLOBIN A1C
Est. average glucose Bld gHb Est-mCnc: 114 mg/dL
Hgb A1c MFr Bld: 5.6 % (ref 4.8–5.6)

## 2018-08-05 MED ORDER — PRAVASTATIN SODIUM 40 MG PO TABS: 40.0000 mg | ORAL_TABLET | Freq: Every evening | ORAL | 2 refills | Status: DC

## 2018-08-05 NOTE — Telephone Encounter (Signed)
Patient called to see if she could get an large safety seat belt, and side pocket for her wheelchair,pt also wanted to see if Medicaid would pay. Please give pt a call back.

## 2018-08-05 NOTE — Telephone Encounter (Signed)
[place] to find out if these are covered.

## 2018-08-06 ENCOUNTER — Telehealth: Payer: Self-pay | Admitting: *Deleted

## 2018-08-06 ENCOUNTER — Telehealth: Payer: Self-pay | Admitting: Family Medicine

## 2018-08-06 DIAGNOSIS — E1165 Type 2 diabetes mellitus with hyperglycemia: Secondary | ICD-10-CM

## 2018-08-06 MED ORDER — GLIPIZIDE 5 MG PO TABS: 5.0000 mg | ORAL_TABLET | Freq: Every day | ORAL | 3 refills | Status: DC

## 2018-08-06 NOTE — Telephone Encounter (Signed)
Left a message for the patient to call back.  Notes recorded by Sanda Klein, MD on 08/05/2018 at 8:33 AM EDT  Mildly elevated WBC (chronic abnormality). Excellent glucose control. Kidney function at baseline, moderately abnormal.  ------   Notes recorded by Sanda Klein, MD on 08/06/2018 at 11:07 AM EDT  Labs do suggest that she is iron deficient. Forwarded to Dr. [name]

## 2018-08-06 NOTE — Telephone Encounter (Signed)
Just to clarify.. he wants to know if another agency will provide?  If that is the question, then typically if one agency will not cover another will not.  Coverage is based on the type of insurance and what they cover, not the agency itself.   They can call the insurance company and see if they would cover and how??  Does that help?  Christen Bame, CMA

## 2018-08-06 NOTE — Telephone Encounter (Signed)
Called patient with results.  IDA present, previously referred to GI for colonoscopy. Patient and son will discuss further.  Leukocytosis persists, will discuss Hematology referral in future. May be due to smoking, but has never had low dose CT, which we previously discussed.  Reduced glipizide to once daily with plans to stop if BG doing well. May just need DPP-4 inhibitor given dietary changes.  Tasks on Monday: - Discuss colonoscopy further - Review smoking cessation - Consider virtual visit with Hematology - address AM BG.   [name], MD  Family Medicine Teaching Service

## 2018-08-06 NOTE — Telephone Encounter (Signed)
Discussed with son---he wants to know if there is an alternative [place] agency?

## 2018-08-08 LAB — IRON AND TIBC
Iron Saturation: 11 % — ABNORMAL LOW (ref 15–55)
Iron: 37 ug/dL (ref 27–139)
Total Iron Binding Capacity: 342 ug/dL (ref 250–450)
UIBC: 305 ug/dL (ref 118–369)

## 2018-08-08 LAB — SPECIMEN STATUS REPORT

## 2018-08-11 ENCOUNTER — Encounter: Payer: Self-pay | Admitting: Family Medicine

## 2018-08-11 ENCOUNTER — Other Ambulatory Visit: Payer: Self-pay

## 2018-08-11 ENCOUNTER — Telehealth (INDEPENDENT_AMBULATORY_CARE_PROVIDER_SITE_OTHER): Payer: Medicare Other | Admitting: Family Medicine

## 2018-08-11 DIAGNOSIS — M51369 Other intervertebral disc degeneration, lumbar region without mention of lumbar back pain or lower extremity pain: Secondary | ICD-10-CM

## 2018-08-11 DIAGNOSIS — D509 Iron deficiency anemia, unspecified: Secondary | ICD-10-CM

## 2018-08-11 DIAGNOSIS — G2401 Drug induced subacute dyskinesia: Secondary | ICD-10-CM

## 2018-08-11 DIAGNOSIS — M5136 Other intervertebral disc degeneration, lumbar region: Secondary | ICD-10-CM

## 2018-08-11 DIAGNOSIS — G252 Other specified forms of tremor: Secondary | ICD-10-CM

## 2018-08-11 DIAGNOSIS — E1169 Type 2 diabetes mellitus with other specified complication: Secondary | ICD-10-CM | POA: Diagnosis not present

## 2018-08-11 DIAGNOSIS — E119 Type 2 diabetes mellitus without complications: Secondary | ICD-10-CM

## 2018-08-11 DIAGNOSIS — E669 Obesity, unspecified: Secondary | ICD-10-CM

## 2018-08-11 DIAGNOSIS — Z720 Tobacco use: Secondary | ICD-10-CM

## 2018-08-11 DIAGNOSIS — I77819 Aortic ectasia, unspecified site: Secondary | ICD-10-CM

## 2018-08-11 DIAGNOSIS — N1832 Chronic kidney disease, stage 3b: Secondary | ICD-10-CM

## 2018-08-11 NOTE — Assessment & Plan Note (Addendum)
Given marked improvement in A1c will discontinue glipizide.  She will continue on a DPP 4 inhibitor.  She is already on a statin.  She is on an ARB.

## 2018-08-11 NOTE — Assessment & Plan Note (Signed)
Discussed at length.  Patient previously referred to gastroenterology.  She reports she is talked with the nurse and is willing to discuss colonoscopy again.  Iron prescribed.  Discussed side effects including constipation.  Recommended every other day iron.  She is to double her MiraLAX dose to reduce constipation.  She denies melena, hematemesis or hematochezia.

## 2018-08-11 NOTE — Assessment & Plan Note (Signed)
Referral to nephrology placed.

## 2018-08-11 NOTE — Assessment & Plan Note (Signed)
Discussed quitting at length.  The patient is amenable to reading about Chantix.  She was previously on this.

## 2018-08-11 NOTE — Progress Notes (Signed)
[facility] Telemedicine Visit  Patient consented to have virtual visit. Method of visit: Telephone  Encounter participants: Patient: Abigail Wallace - located at home Provider: Martyn Malay - located at [facility] Others (if applicable): Son- [name]   Chief Complaint: Discuss diabetes and a number of other concerns  HPI:  Diabetes Patient recently had labs performed which showed incredibly well controlled type 2 diabetes her A1c is down from 11-5.2.  She is very pleased with this change.  Her morning blood sugars on 1 pill of glipizide per day range between 114 and 130.  She denies polyuria or polydipsia.  Anemia The patient denies chest pain, dyspnea or orthostasis.  She does endorse intermittent palpitations.  Over the weekend she had a few hours of palpitations on Saturday.  She does not check her pulse and is not sure of the range.  Denies chest pain associated with this.  She denies melena, hematochezia, hematemesis.  She is not taking any iron tablets.  She denies any bleeding.  She was started on apixaban in May.  Her anemia was present before this time.  She reports she is talked with gastroenterology and thinks the nurses are very nice.  She is willing to consider colonoscopy.  Tobacco use The patient is down to 3 cigarettes/day.  She reports sometimes she smokes for 5 cigarettes when her son is gone.  She desires to get back down to 2 cigarettes/day.  She is interested in learning more about Chantix.  She previously quit with this method  Social history The patient's niece was visiting over the weekend.  She is very glad that she was able to visit.  Her son [name] continues to provide significant care for her.  [name] does report worsening upper extremity weakness.  He reports that this is symmetric.  This is just been slowly progressive related to her history of degenerative disc disease.     ROS: per HPI  Pertinent PMHx: Type 2 diabetes Chronic kidney  disease Iron deficiency anemia Atrial fibrillation Heart failure with preserved ejection fraction History of exophytic lesion on the kidney requiring follow-up History of pulmonary nodules Tobacco abuse Obesity Schizophrenia  Exam:  Respiratory: breathing comfortably on room air   Assessment/Plan:  Well controlled type 2 diabetes mellitus ([place]) Given marked improvement in A1c will discontinue glipizide.  She will continue on a DPP 4 inhibitor.  She is already on a statin.  She is on an ARB.   CKD stage IIIb Referral to nephrology placed.  Tobacco abuse Discussed quitting at length.  The patient is amenable to reading about Chantix.  She was previously on this.  Iron deficiency anemia Discussed at length.  Patient previously referred to gastroenterology.  She reports she is talked with the nurse and is willing to discuss colonoscopy again.  Iron prescribed.  Discussed side effects including constipation.  Recommended every other day iron.  She is to double her MiraLAX dose to reduce constipation.  She denies melena, hematemesis or hematochezia.   Mobility concerns The patient has a history of tardive dyskinesias as well as a tremor.  This makes her upper extremity mobility and strength limited.  Will order seatbelt for wheelchair.   Healthcare maintenance items Discussed colonoscopy as above patient will call gastroenterology to schedule The patient denies any abnormal uterine bleeding The patient is due for a low-dose CT scan this was previously discussed and she declined we will continue to address  At follow-up discussed repeat renal ultrasound for  exophytic lesion on kidney.  She follows with urology.  Time spent during visit with patient: 32 minutes  Follow-up in 1 week to check in on blood sugars.

## 2018-08-14 ENCOUNTER — Telehealth: Payer: Self-pay | Admitting: *Deleted

## 2018-08-14 DIAGNOSIS — D509 Iron deficiency anemia, unspecified: Secondary | ICD-10-CM

## 2018-08-14 MED ORDER — FERROUS SULFATE 324 (65 FE) MG PO TBEC: 324.0000 mg | DELAYED_RELEASE_TABLET | ORAL | 3 refills | Status: DC

## 2018-08-14 NOTE — Telephone Encounter (Signed)
Rx sent to [facility]--- let's see if insurance will cover this.   Thanks! [name], MD  Family Medicine Teaching Service

## 2018-08-14 NOTE — Telephone Encounter (Signed)
Pt calls to verify if she was to pick up iron OTC or a script was going to be sent.  She thought a script was going to be sent in.   To PCP. Christen Bame, CMA

## 2018-08-14 NOTE — Telephone Encounter (Signed)
Patient made aware of results and verbalized understanding.  The patient stated that she will be starting an iron supplement.

## 2018-08-15 NOTE — Telephone Encounter (Signed)
Pt informed.  It has already been delivered to her. Christen Bame, CMA

## 2018-09-12 ENCOUNTER — Ambulatory Visit (INDEPENDENT_AMBULATORY_CARE_PROVIDER_SITE_OTHER): Payer: Medicare Other | Admitting: *Deleted

## 2018-09-12 DIAGNOSIS — I495 Sick sinus syndrome: Secondary | ICD-10-CM

## 2018-09-12 LAB — CUP PACEART REMOTE DEVICE CHECK
Battery Impedance: 253 Ohm
Battery Remaining Longevity: 99 mo
Battery Voltage: 2.78 V
Brady Statistic AP VP Percent: 70 %
Brady Statistic AP VS Percent: 0 %
Brady Statistic AS VP Percent: 30 %
Brady Statistic AS VS Percent: 0 %
Date Time Interrogation Session: 20200828075511
Implantable Lead Implant Date: 20070414
Implantable Lead Implant Date: 20070914
Implantable Lead Location: 753859
Implantable Lead Location: 753860
Implantable Lead Model: 4092
Implantable Lead Model: 5594
Implantable Pulse Generator Implant Date: 20160816
Lead Channel Impedance Value: 440 Ohm
Lead Channel Impedance Value: 609 Ohm
Lead Channel Pacing Threshold Amplitude: 0.625 V
Lead Channel Pacing Threshold Amplitude: 0.75 V
Lead Channel Pacing Threshold Pulse Width: 0.4 ms
Lead Channel Pacing Threshold Pulse Width: 0.4 ms
Lead Channel Setting Pacing Amplitude: 2 V
Lead Channel Setting Pacing Amplitude: 2.5 V
Lead Channel Setting Pacing Pulse Width: 0.4 ms
Lead Channel Setting Sensing Sensitivity: 4 mV

## 2018-09-15 ENCOUNTER — Telehealth: Payer: Self-pay | Admitting: Family Medicine

## 2018-09-15 DIAGNOSIS — D509 Iron deficiency anemia, unspecified: Secondary | ICD-10-CM

## 2018-09-15 NOTE — Telephone Encounter (Signed)
Nursing-please call patient and help her schedule appointment for Sept/October.  Thanks! Abree Romick

## 2018-09-15 NOTE — Telephone Encounter (Signed)
Called patient check in. Blood glucose range 120-140.   Patient had a fall transferring from bed to wheelchair. Did not hit head, no LOC.  Patient has not heard from Gastroenterology regarding   Patient had extreme fatigue 2 weeks ago, now completely resolved. Denies chest pain, dyspnea, melena, hematochezia or other symptoms.   [name], MD  Family Medicine Teaching Service

## 2018-09-16 ENCOUNTER — Encounter: Payer: Self-pay | Admitting: Gastroenterology

## 2018-09-16 NOTE — Telephone Encounter (Signed)
Pt informed and scheduled for an appt. Hagan Maltz, CMA  

## 2018-09-17 ENCOUNTER — Encounter: Payer: Self-pay | Admitting: Cardiology

## 2018-09-17 NOTE — Progress Notes (Signed)
Remote pacemaker transmission.   

## 2018-09-18 DIAGNOSIS — N184 Chronic kidney disease, stage 4 (severe): Secondary | ICD-10-CM | POA: Diagnosis not present

## 2018-09-18 DIAGNOSIS — D631 Anemia in chronic kidney disease: Secondary | ICD-10-CM | POA: Diagnosis not present

## 2018-09-18 DIAGNOSIS — N189 Chronic kidney disease, unspecified: Secondary | ICD-10-CM | POA: Diagnosis not present

## 2018-09-18 DIAGNOSIS — I129 Hypertensive chronic kidney disease with stage 1 through stage 4 chronic kidney disease, or unspecified chronic kidney disease: Secondary | ICD-10-CM | POA: Diagnosis not present

## 2018-09-18 DIAGNOSIS — N2581 Secondary hyperparathyroidism of renal origin: Secondary | ICD-10-CM | POA: Diagnosis not present

## 2018-09-25 ENCOUNTER — Other Ambulatory Visit: Payer: Self-pay | Admitting: Nephrology

## 2018-09-25 DIAGNOSIS — N184 Chronic kidney disease, stage 4 (severe): Secondary | ICD-10-CM

## 2018-09-29 ENCOUNTER — Telehealth: Payer: Self-pay | Admitting: Family Medicine

## 2018-09-29 NOTE — Telephone Encounter (Signed)
Patient called the office to see if she could get a medication called in for a kidney infection, I suggested that pt do virtual visit, since pt doesn't have transportation for in office apt, but she didn't want virtual apt.

## 2018-09-30 ENCOUNTER — Telehealth: Payer: Self-pay | Admitting: Family Medicine

## 2018-09-30 DIAGNOSIS — N3000 Acute cystitis without hematuria: Secondary | ICD-10-CM

## 2018-09-30 MED ORDER — CEPHALEXIN 500 MG PO CAPS: 500.0000 mg | ORAL_CAPSULE | Freq: Two times a day (BID) | ORAL | 0 refills | Status: DC

## 2018-09-30 NOTE — Telephone Encounter (Signed)
Patient calling back about wanting medication for her kidney infection. Please advise

## 2018-09-30 NOTE — Telephone Encounter (Signed)
Patient regarding symptoms.  The patient and her son are both on the phone.  The patient reports 4 to 5 days of burning with urination.  She reports increased urinary frequency.  No fevers, nausea or decreased appetite.  She is eating and drinking well.  Morning blood sugars reviewed.  She denies any nausea.  Prescription sent to pharmacy for cephalexin which is renally dosed.  In terms of the patient's blood sugar she reports morning blood sugars ranging from 1 39-1 50 time.  No signs or symptoms of hypoglycemia.  She is still try to cut down on cigarettes.  Will call to check in in several weeks.  All questions were answered.  [name], MD  Family Medicine Teaching Service

## 2018-09-30 NOTE — Telephone Encounter (Signed)
Called patient, see separate encounter.  [name], MD  Family Medicine Teaching Service

## 2018-10-07 ENCOUNTER — Other Ambulatory Visit: Payer: Self-pay

## 2018-10-07 MED ORDER — DICLOFENAC SODIUM 1 % TD GEL: 4.0000 g | Freq: Four times a day (QID) | TRANSDERMAL | 0 refills | Status: DC | PRN

## 2018-10-09 ENCOUNTER — Ambulatory Visit
Admission: RE | Admit: 2018-10-09 | Discharge: 2018-10-09 | Disposition: A | Discharge status: Home / Self Care | Payer: Medicare Other | Source: Ambulatory Visit | Attending: Nephrology | Admitting: Nephrology

## 2018-10-09 DIAGNOSIS — N184 Chronic kidney disease, stage 4 (severe): Secondary | ICD-10-CM | POA: Diagnosis not present

## 2018-10-20 ENCOUNTER — Other Ambulatory Visit: Payer: Self-pay | Admitting: Cardiovascular Disease

## 2018-10-20 ENCOUNTER — Other Ambulatory Visit: Payer: Self-pay | Admitting: Family Medicine

## 2018-10-23 ENCOUNTER — Ambulatory Visit: Payer: Medicare Other | Admitting: Gastroenterology

## 2018-10-27 ENCOUNTER — Telehealth: Payer: Self-pay

## 2018-10-27 NOTE — Telephone Encounter (Signed)
Patient calls nurse line to inform PCP that her kidney provider put her on a Vitamin D supplement, and also wants her to increase her iron pills to every day. Patient has some concerns with this, as she already deals with constipation. Patient is taking her iron pills every other day as of now. Patient would like PCPs opinion on this. Please advise.

## 2018-10-27 NOTE — Telephone Encounter (Signed)
Called patient back. Discussed at length. Okay to start vitamin D. Will continue iron every other day due to constipation. Repeat CBC at end of month.   Call ended due to technical issue with phones, please forward her call to me if she has questions.  [name], MD  Family Medicine Teaching Service

## 2018-10-27 NOTE — Telephone Encounter (Signed)
Completed call, all questions answered.   [name], MD  Family Medicine Teaching Service

## 2018-10-28 ENCOUNTER — Ambulatory Visit: Payer: Medicare Other | Admitting: Sports Medicine

## 2018-11-07 ENCOUNTER — Other Ambulatory Visit: Payer: Self-pay | Admitting: Cardiovascular Disease

## 2018-11-11 ENCOUNTER — Telehealth: Payer: Self-pay | Admitting: Cardiovascular Disease

## 2018-11-11 NOTE — Telephone Encounter (Signed)
Pt calling requesting a call back. Please address

## 2018-11-12 ENCOUNTER — Other Ambulatory Visit: Payer: Self-pay

## 2018-11-12 ENCOUNTER — Encounter: Payer: Self-pay | Admitting: Gastroenterology

## 2018-11-12 ENCOUNTER — Ambulatory Visit (INDEPENDENT_AMBULATORY_CARE_PROVIDER_SITE_OTHER): Payer: Medicare Other | Admitting: Family Medicine

## 2018-11-12 ENCOUNTER — Encounter: Payer: Self-pay | Admitting: Family Medicine

## 2018-11-12 VITALS — BP 138/70 | HR 74 | Wt 250.6 lb

## 2018-11-12 DIAGNOSIS — I480 Paroxysmal atrial fibrillation: Secondary | ICD-10-CM | POA: Diagnosis not present

## 2018-11-12 DIAGNOSIS — E119 Type 2 diabetes mellitus without complications: Secondary | ICD-10-CM | POA: Diagnosis not present

## 2018-11-12 DIAGNOSIS — Z720 Tobacco use: Secondary | ICD-10-CM | POA: Diagnosis not present

## 2018-11-12 DIAGNOSIS — D509 Iron deficiency anemia, unspecified: Secondary | ICD-10-CM

## 2018-11-12 DIAGNOSIS — R300 Dysuria: Secondary | ICD-10-CM

## 2018-11-12 DIAGNOSIS — L304 Erythema intertrigo: Secondary | ICD-10-CM | POA: Diagnosis not present

## 2018-11-12 DIAGNOSIS — E1169 Type 2 diabetes mellitus with other specified complication: Secondary | ICD-10-CM | POA: Diagnosis not present

## 2018-11-12 DIAGNOSIS — E669 Obesity, unspecified: Secondary | ICD-10-CM

## 2018-11-12 DIAGNOSIS — M546 Pain in thoracic spine: Secondary | ICD-10-CM | POA: Diagnosis not present

## 2018-11-12 LAB — POCT UA - MICROSCOPIC ONLY

## 2018-11-12 LAB — POCT URINALYSIS DIP (CLINITEK)
Bilirubin, UA: NEGATIVE
Glucose, UA: 100 mg/dL — AB
Ketones, POC UA: NEGATIVE mg/dL
Leukocytes, UA: NEGATIVE
Nitrite, UA: NEGATIVE
POC PROTEIN,UA: >=300 — AB
Spec Grav, UA: 1.015 (ref 1.010–1.025)
Urobilinogen, UA: 0.2 E.U./dL
pH, UA: 7 (ref 5.0–8.0)

## 2018-11-12 LAB — POCT GLYCOSYLATED HEMOGLOBIN (HGB A1C): HbA1c, POC (controlled diabetic range): 6.3 % (ref 0.0–7.0)

## 2018-11-12 MED ORDER — NYSTATIN 100000 UNIT/GM EX CREA: 1.0000 "application " | TOPICAL_CREAM | Freq: Two times a day (BID) | CUTANEOUS | 3 refills | Status: DC | PRN

## 2018-11-12 MED ORDER — BACLOFEN 5 MG PO TABS: 5.0000 mg | ORAL_TABLET | Freq: Two times a day (BID) | ORAL | 0 refills | Status: DC | PRN

## 2018-11-12 NOTE — Assessment & Plan Note (Signed)
The patient missed her appointment with gastroenterology as she reports he had diarrhea that day.  New referral placed for colonoscopy.  She is continuing to take iron therapy.

## 2018-11-12 NOTE — Patient Instructions (Addendum)
   It was wonderful to see you today.  Thank you for choosing [facility].   Please call [phone] with any questions about today's appointment.  Please be sure to schedule follow up at the front  desk before you leave today.   [name], MD  Family Medicine    Take up to 6 tablets of Tylenol for pain--- up 2 at a time   You can try 1 baclofen at night for severe pain  [name]--- please call if you have questions about this medications   Please go get an x-ray at [facility]--- you do not need an appointment, you can just walk in   I will call you with results   [place]   Tobacco use is damaging to your body. It increases your risk of stroke, heart attack, lung cancer, and serious lung disease in the future. It also reduces your fertility.   Quitting tobacco is the best thing for your health but is a challenge---nicotine, a chemical in cigarettes, is highly addictive.   You can call 1 800 QUIT NOW (1-9478858502)---you will be connected with a Artist. They can also mail you nicotine gums, lozenges, and patches to quit.   Ask me about patches (which you wear all day) and gums (which you use when you have a craving) to help you quit.

## 2018-11-12 NOTE — Assessment & Plan Note (Signed)
She is currently down to about 6 cigarettes a day.  We discussed.  Recommend complete cessation.  She will continue to work on cutting down.

## 2018-11-12 NOTE — Assessment & Plan Note (Signed)
A1c is at goal.  Congratulated.  Encouraged continued dietary changes and modifications.

## 2018-11-12 NOTE — Assessment & Plan Note (Signed)
This is new in onset over the last few weeks.  It does intermittently awaken her from sleep and given her age we will proceed with imaging.  Differential is broad most likely this represents degenerative disc disease but differential includes compression fracture.  No worsening incontinence or other red flags.  Will proceed with further imaging including MRI as warranted.  Recommended Tylenol at increased dose prescribe small dose of baclofen to take at night she takes no other sedating medications.

## 2018-11-12 NOTE — Telephone Encounter (Signed)
Left a message for the patient to call back if anything was needed or if she had any questions.

## 2018-11-12 NOTE — Progress Notes (Signed)
Patient Name: Abigail Wallace Date of Birth: 1945/07/16 Date of Visit: 11/12/18 PCP: Martyn Malay, MD  Chief Complaint: Back pain  Subjective: Abigail Wallace is a pleasant 73 y.o. with medical history significant for tobacco abuse, diabetes which is now well controlled, hypertension, chronic kidney disease, atrial fibrillation on Eliquis and mood disorder presenting today for multiple concerns most important to her today is back pain.  The patient reports about 3 weeks of ongoing back pain.  This is intermittent.  This is a spasm she reports.  Bothers her often times at night.  It is right-sided and radiates down the right leg.  She has back pain in the past but she reports this is different.  She reports no difference in her incontinence.  No saddle anesthesia.  She has tried Tylenol and Voltaren gel which has not helped.  She denies any falls.  Her son reports she is ambulating less.  He confirms the above history.   The patient has been taking her linagliptin as prescribed.  She is being very mindful of her diet.  Her A1c today is at goal.  She has no concerns about her diabetes medications.  She denies polyuria polydipsia.  She has lost an additional 6 pounds since her last visit for which she was congratulated.  The patient reports her urine is white in color at times.  She is very concerned some infection.  She denies dysuria, hematuria.  The patient is on atypical antipsychotics as prescribed by her psychiatrist.  They have requested lab evaluation for side effects.  Patient reports she has cut down to about 1/2 pack of cigarettes a day.  She still precontemplative about quitting completely.  She reports the pandemic and the stress of her family has increased her cigarette use over the last few days.  The patient reports a several week history of a left-sided rash.  This is in her periumbilical or area in her intertriginous folds.  She denies pruritus.  She has not tried anything in  the area.  ROS: Per HPI.  Denies chest pain, headaches, fevers  I have reviewed the patient's medical, surgical, family, and social history as appropriate.  Vitals:   11/12/18 1130  BP: 138/70  Pulse: 74  SpO2: 99%   HEENT: Sclera anicteric. Dentition is absent. Appears well hydrated. Neck: Supple Cardiac: Regular rate and rhythm. Normal S1/S2. No murmurs, rubs, or gallops appreciated. Lungs: Clear bilaterally to ascultation.  Abdomen: Normoactive bowel sounds. No tenderness to deep or light palpation. No rebound or guarding.  Intertriginous area there is an erythematous area that is throughout the infraumbilical area.  This is erythematous macules and patches without scaling with satellite lesions.  Back examined there are no rashes.  There is no deformity.  There are several seborrheic keratoses present.  No prior incisions.  No tenderness to palpation along paraspinal area or spinous processes.  She has some left-sided and right-sided SI joint tenderness.  She has preserved lower extremity strength that is 4 out of 4 throughout consistent with prior exams.  2+ pedal edema left greater than right, unchanged from prior  Well controlled type 2 diabetes mellitus ([place]) A1c is at goal.  Congratulated.  Encouraged continued dietary changes and modifications.  Tobacco abuse She is currently down to about 6 cigarettes a day.  We discussed.  Recommend complete cessation.  She will continue to work on cutting down.  Acute right-sided thoracic back pain This is new in onset over the last few  weeks.  It does intermittently awaken her from sleep and given her age we will proceed with imaging.  Differential is broad most likely this represents degenerative disc disease but differential includes compression fracture.  No worsening incontinence or other red flags.  Will proceed with further imaging including MRI as warranted.  Recommended Tylenol at increased dose prescribe small dose of baclofen to  take at night she takes no other sedating medications.  Iron deficiency anemia The patient missed her appointment with gastroenterology as she reports he had diarrhea that day.  New referral placed for colonoscopy.  She is continuing to take iron therapy.   Intertrigo, this is the most likely diagnosis nystatin prescribed.  Discussed return precautions.  Discussed keeping the area clean and dry as best as  Change in urine unlikely to be urinary tract infection, UA negative.  Will not send for culture.  Mood disorder, the patient follows with psychiatry.  For her mood disorder lipid panel and hepatic panel were collected for monitoring of atypical antipsychotic therapy today.  At follow-up recommend TSH and PCV 23.   Return to care in 2 weeks to check back pain.   Declined influenza.    [name], MD  Family Medicine Teaching Service

## 2018-11-13 ENCOUNTER — Other Ambulatory Visit: Payer: Self-pay | Admitting: Family Medicine

## 2018-11-13 ENCOUNTER — Ambulatory Visit
Admission: RE | Admit: 2018-11-13 | Discharge: 2018-11-13 | Disposition: A | Discharge status: Home / Self Care | Payer: Medicare Other | Source: Ambulatory Visit | Attending: Family Medicine | Admitting: Family Medicine

## 2018-11-13 DIAGNOSIS — M546 Pain in thoracic spine: Secondary | ICD-10-CM

## 2018-11-13 DIAGNOSIS — M545 Low back pain: Secondary | ICD-10-CM | POA: Diagnosis not present

## 2018-11-13 DIAGNOSIS — F200 Paranoid schizophrenia: Secondary | ICD-10-CM | POA: Diagnosis not present

## 2018-11-13 LAB — LIPID PANEL
Chol/HDL Ratio: 3.9 ratio (ref 0.0–4.4)
Cholesterol, Total: 168 mg/dL (ref 100–199)
HDL: 43 mg/dL (ref >39)
LDL Chol Calc (NIH): 77 mg/dL (ref 0–99)
Triglycerides: 295 mg/dL — ABNORMAL HIGH (ref 0–149)
VLDL Cholesterol Cal: 48 mg/dL — ABNORMAL HIGH (ref 5–40)

## 2018-11-13 LAB — HEPATIC FUNCTION PANEL
ALT: 8 IU/L (ref 0–32)
AST: 13 IU/L (ref 0–40)
Alkaline Phosphatase: 104 IU/L (ref 39–117)
Bilirubin Total: 0.2 mg/dL (ref 0.0–1.2)
Bilirubin, Direct: 0.07 mg/dL (ref 0.00–0.40)
Total Protein: 6.8 g/dL (ref 6.0–8.5)

## 2018-11-13 LAB — RENAL FUNCTION PANEL
Albumin: 4.4 g/dL (ref 3.7–4.7)
BUN/Creatinine Ratio: 16 (ref 12–28)
BUN: 24 mg/dL (ref 8–27)
CO2: 19 mmol/L — ABNORMAL LOW (ref 20–29)
Calcium: 10.2 mg/dL (ref 8.7–10.3)
Chloride: 105 mmol/L (ref 96–106)
Creatinine, Ser: 1.53 mg/dL — ABNORMAL HIGH (ref 0.57–1.00)
GFR calc Af Amer: 38 mL/min/{1.73_m2} — ABNORMAL LOW (ref >59)
GFR calc non Af Amer: 33 mL/min/{1.73_m2} — ABNORMAL LOW (ref >59)
Glucose: 124 mg/dL — ABNORMAL HIGH (ref 65–99)
Phosphorus: 4.1 mg/dL (ref 3.0–4.3)
Potassium: 4.1 mmol/L (ref 3.5–5.2)
Sodium: 140 mmol/L (ref 134–144)

## 2018-11-13 LAB — CBC WITH DIFFERENTIAL/PLATELET
Basophils Absolute: 0.1 10*3/uL (ref 0.0–0.2)
Basos: 1 %
EOS (ABSOLUTE): 0.3 10*3/uL (ref 0.0–0.4)
Eos: 2 %
Hematocrit: 39.4 % (ref 34.0–46.6)
Hemoglobin: 12.4 g/dL (ref 11.1–15.9)
Immature Grans (Abs): 0.2 10*3/uL — ABNORMAL HIGH (ref 0.0–0.1)
Immature Granulocytes: 1 %
Lymphocytes Absolute: 4.1 10*3/uL — ABNORMAL HIGH (ref 0.7–3.1)
Lymphs: 28 %
MCH: 26.1 pg — ABNORMAL LOW (ref 26.6–33.0)
MCHC: 31.5 g/dL (ref 31.5–35.7)
MCV: 83 fL (ref 79–97)
Monocytes Absolute: 0.6 10*3/uL (ref 0.1–0.9)
Monocytes: 4 %
Neutrophils Absolute: 9.5 10*3/uL — ABNORMAL HIGH (ref 1.4–7.0)
Neutrophils: 64 %
Platelets: 322 10*3/uL (ref 150–450)
RBC: 4.76 x10E6/uL (ref 3.77–5.28)
RDW: 16.1 % — ABNORMAL HIGH (ref 11.7–15.4)
WBC: 14.9 10*3/uL — ABNORMAL HIGH (ref 3.4–10.8)

## 2018-11-14 ENCOUNTER — Telehealth: Payer: Self-pay | Admitting: Family Medicine

## 2018-11-14 DIAGNOSIS — D72829 Elevated white blood cell count, unspecified: Secondary | ICD-10-CM

## 2018-11-14 DIAGNOSIS — S22000A Wedge compression fracture of unspecified thoracic vertebra, initial encounter for closed fracture: Secondary | ICD-10-CM

## 2018-11-14 DIAGNOSIS — G8929 Other chronic pain: Secondary | ICD-10-CM | POA: Insufficient documentation

## 2018-11-14 DIAGNOSIS — M549 Dorsalgia, unspecified: Secondary | ICD-10-CM | POA: Insufficient documentation

## 2018-11-14 DIAGNOSIS — I77811 Abdominal aortic ectasia: Secondary | ICD-10-CM

## 2018-11-14 NOTE — Telephone Encounter (Signed)
Attempted to call patient and son at number below, left generic voicemail.  [name], MD  Family Medicine Teaching Service

## 2018-11-14 NOTE — Telephone Encounter (Signed)
Son calls nurse line to speak with PCP about recent xrays. Please call him back at [phone].

## 2018-11-14 NOTE — Telephone Encounter (Signed)
Called patient to discuss results.  Her son is gone for the day and she requests a call back on Monday.  Overall labs look quite good.  There are a few notable abnormalities that need to be followed up.  Which I discussed with her.   Leukocytosis chemistries persisted now for some time she is a smoker.  But given her age and vertebral compression fracture which has been present since 2011 refer to hematology.  Vertebral compression fracture, present on 2000 and CT.  This is chronic and unchanged from prior.  Her creatinine clearance limits bisphosphonate therapy.  Will discuss with pharmacy.  May refer to endocrine  Known abdominal ectatic aorta.  CT showed this earlier in 2019.  Will obtain ultrasound.  Son to call back on Monday I will discuss with them both at length.  [name], MD  Family Medicine Teaching Service

## 2018-11-17 NOTE — Telephone Encounter (Signed)
Called son, would like to conference with mother in AM.   [name], MD  [facility] Medicine Teaching Service

## 2018-11-17 NOTE — Telephone Encounter (Signed)
Patients son calls to remind PCP to call him on his cell phone number today between 4-5pm.  [phone]

## 2018-11-18 ENCOUNTER — Telehealth: Payer: Self-pay | Admitting: *Deleted

## 2018-11-18 NOTE — Telephone Encounter (Addendum)
Called patient and son to review. Had family member pass away over weekend (sister's MIL). Supportive listening offered.  - Patient had prior imagine demonstrating compression fracture, not on anti-resorptive therapy. Will discuss treatment with pharmacy team given CrCl.  - Leukocytosis, persistent. This has been present X 3 years. Anemia improved with iron. Discussed importance of colonoscopy. Again recommended screening lung CT given smoking history, declined. Agreeable to seeing Hematology given persistence of leukocytosis.  - Creatinine is stable - Lipid panel, non fasting, appropriate. - AAA is known, repeat ultrasound ordered, will help patient to schedule. Follow up arranged for 1 week.  - Reviewed return precautions.   [name], MD  Family Medicine Teaching Service

## 2018-11-18 NOTE — Addendum Note (Signed)
Addended by: [name], Zaakirah Kistner on: 11/18/2018 08:18 AM   Modules accepted: Orders

## 2018-11-18 NOTE — Telephone Encounter (Signed)
Received fax from pharmacy, PA needed on Baclofen. Awaiting Clinical questions that were requested via Cover My Meds.  Waiting on response.  Cover My Meds info: Key: [id number]  Christen Bame, CMA

## 2018-11-18 NOTE — Telephone Encounter (Signed)
Called pharmacy yesterday. Switched to 1/2 tab of 10 mg tablet.   [name], MD  Family Medicine Teaching Service

## 2018-11-18 NOTE — Addendum Note (Signed)
Addended by: [name], CARINA on: 11/18/2018 08:44 AM   Modules accepted: Orders

## 2018-11-18 NOTE — Telephone Encounter (Signed)
Attempted to call patient and son, no answer.   [name], MD  Family Medicine Teaching Service

## 2018-11-19 NOTE — Telephone Encounter (Signed)
Canceled PA in Covermymeds. Christen Bame, CMA

## 2018-11-24 ENCOUNTER — Other Ambulatory Visit: Payer: Self-pay | Admitting: Cardiovascular Disease

## 2018-11-27 ENCOUNTER — Telehealth: Payer: Medicare Other | Admitting: Family Medicine

## 2018-11-28 ENCOUNTER — Telehealth: Payer: Self-pay

## 2018-11-28 DIAGNOSIS — N3000 Acute cystitis without hematuria: Secondary | ICD-10-CM

## 2018-11-28 MED ORDER — CEPHALEXIN 500 MG PO CAPS: 500.0000 mg | ORAL_CAPSULE | Freq: Two times a day (BID) | ORAL | 0 refills | Status: DC

## 2018-11-28 NOTE — Telephone Encounter (Signed)
Call patient regarding symptoms.  She reports 2 days of burning with urination.  She reports increased frequency.  No back pain, fevers, nausea, vomiting.  She denies vaginal discharge or bleeding.  Rx for Keflex sent to pharmacy at appropriate renal dose.  Return precautions discussed.

## 2018-11-28 NOTE — Telephone Encounter (Signed)
Patient calls nurse line endorsing dysuria for ~two weeks. Patient stated she was hoping the discomfort would pass, however it has not. Patient stated she has not been experiencing chills, however low grade 99 fever this am. Patient denies any other UTI sxs besides frequency. Patient stated, "I can not come in." Patient requesting an antibiotic to her pharmacy. Please advise.

## 2018-12-15 ENCOUNTER — Telehealth: Payer: Self-pay | Admitting: *Deleted

## 2018-12-15 ENCOUNTER — Ambulatory Visit (INDEPENDENT_AMBULATORY_CARE_PROVIDER_SITE_OTHER): Payer: Medicare Other | Admitting: *Deleted

## 2018-12-15 ENCOUNTER — Telehealth: Payer: Self-pay | Admitting: Family Medicine

## 2018-12-15 DIAGNOSIS — I495 Sick sinus syndrome: Secondary | ICD-10-CM

## 2018-12-15 DIAGNOSIS — M546 Pain in thoracic spine: Secondary | ICD-10-CM

## 2018-12-15 LAB — CUP PACEART REMOTE DEVICE CHECK
Battery Impedance: 253 Ohm
Battery Remaining Longevity: 102 mo
Battery Voltage: 2.79 V
Brady Statistic AP VP Percent: 66 %
Brady Statistic AP VS Percent: 0 %
Brady Statistic AS VP Percent: 34 %
Brady Statistic AS VS Percent: 0 %
Date Time Interrogation Session: 20201130080514
Implantable Lead Implant Date: 20070414
Implantable Lead Implant Date: 20070914
Implantable Lead Location: 753859
Implantable Lead Location: 753860
Implantable Lead Model: 4092
Implantable Lead Model: 5594
Implantable Pulse Generator Implant Date: 20160816
Lead Channel Impedance Value: 547 Ohm
Lead Channel Impedance Value: 609 Ohm
Lead Channel Pacing Threshold Amplitude: 0.5 V
Lead Channel Pacing Threshold Amplitude: 0.75 V
Lead Channel Pacing Threshold Pulse Width: 0.4 ms
Lead Channel Pacing Threshold Pulse Width: 0.4 ms
Lead Channel Setting Pacing Amplitude: 2 V
Lead Channel Setting Pacing Amplitude: 2.5 V
Lead Channel Setting Pacing Pulse Width: 0.4 ms
Lead Channel Setting Sensing Sensitivity: 4 mV

## 2018-12-15 MED ORDER — DICLOFENAC SODIUM 1 % EX GEL: CUTANEOUS | 4 refills | Status: DC

## 2018-12-15 NOTE — Telephone Encounter (Signed)
Rx sent to pharmacy.   [name], MD  Family Medicine Teaching Service

## 2018-12-15 NOTE — Telephone Encounter (Signed)
Please schedule with me for virtual visit on 12/14 in AM.   Thank you, [name], MD  [facility] Medicine Teaching Service

## 2018-12-15 NOTE — Telephone Encounter (Signed)
Patient called to check the status of her upcoming appt. Told patient she did not have any future appts with [place] and patient thought she had one for the 7th of December. Asked patient if she would like to schedule an appt now with Dr. [name]. Patient says "can you just send a message back to Dr. [name] and see if or when she needs to see me next, or you can have her nurse give me a call."

## 2018-12-15 NOTE — Telephone Encounter (Signed)
Rx request for diclofenac sodium 1% gel. Deseree Kennon Holter, CMA

## 2018-12-17 ENCOUNTER — Encounter: Payer: Self-pay | Admitting: Gastroenterology

## 2018-12-17 ENCOUNTER — Ambulatory Visit (INDEPENDENT_AMBULATORY_CARE_PROVIDER_SITE_OTHER): Payer: Medicare Other | Admitting: Gastroenterology

## 2018-12-17 VITALS — BP 118/64 | HR 73 | Temp 98.3°F | Ht 66.0 in | Wt 257.0 lb

## 2018-12-17 DIAGNOSIS — N189 Chronic kidney disease, unspecified: Secondary | ICD-10-CM

## 2018-12-17 DIAGNOSIS — Z7901 Long term (current) use of anticoagulants: Secondary | ICD-10-CM

## 2018-12-17 DIAGNOSIS — K5909 Other constipation: Secondary | ICD-10-CM

## 2018-12-17 DIAGNOSIS — I2510 Atherosclerotic heart disease of native coronary artery without angina pectoris: Secondary | ICD-10-CM | POA: Diagnosis not present

## 2018-12-17 DIAGNOSIS — D631 Anemia in chronic kidney disease: Secondary | ICD-10-CM

## 2018-12-17 NOTE — Patient Instructions (Signed)
If you are age 75 or older, your body mass index should be between 23-30. Your Body mass index is 41.48 kg/m. If this is out of the aforementioned range listed, please consider follow up with your Primary Care Provider.  If you are age 37 or younger, your body mass index should be between 19-25. Your Body mass index is 41.48 kg/m. If this is out of the aformentioned range listed, please consider follow up with your Primary Care Provider.   It was a pleasure to see you today!  Dr. Loletha Carrow

## 2018-12-17 NOTE — Progress Notes (Addendum)
[facility] Consult Note:  History: Abigail Wallace 12/17/2018  Referring provider: Martyn Malay, MD  Reason for consult/chief complaint: Anemia (dr brown referred; blood work 10-28)   Subjective  HPI:  This is a pleasant 73 year old woman accompanied by her son and referred by primary care for microcytic anemia.  It was discovered earlier this year and normocytic, then later a similar hemoglobin level and microcytic.  Iron levels normal in February and again in July.  Iron tablets were started, which she was only able to take every other day because they worsen her pre-existing constipation.  Recent hemoglobin improved to normal. Limited historian and health literacy. Many years of chronic constipation, wheelchair-bound due to neck pain and hip arthritis.  Lives with her son, who cares with her and helps manage her medical affairs.  She denies rectal bleeding, tends have a BM about every 3 days, and sometimes has some lower abdominal pain during longer periods constipation.  She had been prescribed some MiraLAX which she takes occasionally.  No prior colonoscopy or other colon cancer screening.   ROS:  Review of Systems  Constitutional: Negative for appetite change and unexpected weight change.  HENT: Negative for mouth sores and voice change.   Eyes: Negative for pain and redness.  Respiratory: Negative for cough and shortness of breath.   Cardiovascular: Negative for chest pain and palpitations.  Genitourinary: Negative for dysuria and hematuria.  Musculoskeletal: Negative for arthralgias and myalgias.       Chronic arthritic pain, especially hips  Skin: Negative for pallor and rash.  Neurological: Negative for weakness and headaches.       Tardive dyskinesia  Hematological: Negative for adenopathy.  Psychiatric/Behavioral:       History of psychosis, reportedly stable.     Past Medical History: Past Medical History:  Diagnosis Date  . Abdominal  wall hernia 01/2017  . Atrial fibrillation ([place])   . COPD (chronic obstructive pulmonary disease) ([place])   . Coronary artery disease    NON-CRITICAL  . GERD (gastroesophageal reflux disease)   . Gout   . Hypertension   . Memory difficulties 12/02/2013  . Mental disorder   . Psychosis ([place])   . Tardive dyskinesia 12/02/2013  . Type 2 diabetes mellitus ([place])      Past Surgical History: Past Surgical History:  Procedure Laterality Date  . CARDIAC CATHETERIZATION  2007   Non-critical.   . CHOLECYSTECTOMY    . ELBOW SURGERY    . EP IMPLANTABLE DEVICE N/A 08/31/2014   Procedure: PPM Generator Changeout;  Surgeon: Sanda Klein, MD;  Location: [facility];  Service: Cardiovascular;  Laterality: N/A;  . INSERT / REPLACE / REMOVE PACEMAKER  2007   [place]/SYMPTOMATIC HEART BLOCK  . PACEMAKER PLACEMENT  09/28/2005   2/2 SSS?  . Persantine stress test  05/01/2010   EF 66%. Normal LV sys fx. Unchanged from previous studies.   . TRANSTHORACIC ECHOCARDIOGRAM  09/08/10   SEVERE CONCENTRIC HYPERTROPHY.LV FUNCTION WAS VIGOROUS.EF 65%-70%.VENTRICULAR SEPTUM-INCOORDINATE MOTION.LEFT ATRIUM-MILDLY DILATED.TRIVIAL TR.  . TUBAL LIGATION       Family History: Family History  Problem Relation Age of Onset  . Heart disease Mother   . Heart disease Father   . Stroke Sister   . Diabetes type II Son   . Colon cancer Neg Hx     Social History: Social History   Socioeconomic History  . Marital status: Divorced    Spouse name: Not on file  . Number of  children: Not on file  . Years of education: Not on file  . Highest education level: Not on file  Occupational History  . Not on file  Social Needs  . Financial resource strain: Not on file  . Food insecurity    Worry: Not on file    Inability: Not on file  . Transportation needs    Medical: Not on file    Non-medical: Not on file  Tobacco Use  . Smoking status: Current Every Day Smoker    Packs/day: 0.25    Types:  Cigarettes  . Smokeless tobacco: Never Used  . Tobacco comment: 6 cigs a day  Substance and Sexual Activity  . Alcohol use: No    Alcohol/week: 0.0 standard drinks  . Drug use: No  . Sexual activity: Not on file  Lifestyle  . Physical activity    Days per week: Not on file    Minutes per session: Not on file  . Stress: Not on file  Relationships  . Social Herbalist on phone: Not on file    Gets together: Not on file    Attends religious service: Not on file    Active member of club or organization: Not on file    Attends meetings of clubs or organizations: Not on file    Relationship status: Not on file  Other Topics Concern  . Not on file  Social History Narrative   Lives alone. Son brings to appointments ([name])    Children: 2 daughters, 1 son; 9 GC, 5 GGC.    Occupation: disabled.    Caffeine:   Tobacco: 5-6/day   Denies alcohol.     Allergies: Allergies  Allergen Reactions  . Abilify [Aripiprazole] Anaphylaxis, Shortness Of Breath and Other (See Comments)    Tremors  . Clozapine Anaphylaxis  . Benadryl [Diphenhydramine Hcl] Other (See Comments)    States affects her vision  . Cogentin [Benztropine] Other (See Comments)    Affects mobility  . Haloperidol Lactate Other (See Comments)    Vision changes, Shaking, Drooling  . Latuda [Lurasidone Hcl] Other (See Comments)    "Tremors and shakes."   . Remeron [Mirtazapine] Other (See Comments)    Tremors and shakes  . Codeine Other (See Comments)    Unknown   . Latex Rash    Outpatient Meds: Current Outpatient Medications  Medication Sig Dispense Refill  . acetaminophen (TYLENOL) 500 MG tablet Take 1,000 mg by mouth at bedtime.     Marland Kitchen albuterol (PROVENTIL HFA;VENTOLIN HFA) 108 (90 Base) MCG/ACT inhaler Inhale 2 puffs into the lungs every 6 (six) hours as needed for wheezing or shortness of breath. 1 Inhaler 2  . allopurinol (ZYLOPRIM) 100 MG tablet TAKE ONE TABLET BY MOUTH DAILY 30 tablet 6  .  amLODipine (NORVASC) 10 MG tablet Take 1 tablet (10 mg total) by mouth daily. 90 tablet 3  . apixaban (ELIQUIS) 5 MG TABS tablet Take 1 tablet (5 mg total) by mouth 2 (two) times daily. 60 tablet 11  . baclofen 5 MG TABS Take 5 mg by mouth 2 (two) times daily as needed for muscle spasms. 10 tablet 0  . Blood Glucose Monitoring Suppl (ONETOUCH VERIO) w/Device KIT 1 Device by Does not apply route 3 (three) times a week. 1 kit 1  . diclofenac Sodium (VOLTAREN) 1 % GEL Use 4 times daily to affected area 100 g 4  . divalproex (DEPAKOTE ER) 250 MG 24 hr tablet Take 20 mg by mouth  at bedtime.    . ferrous sulfate 324 (65 Fe) MG TBEC Take 1 tablet (324 mg total) by mouth every other day. 90 tablet 3  . glucose blood test strip Test each morning before breakfast 100 each 12  . Incontinence Supplies MISC 1 Units by Does not apply route as needed. 100 each prn  . Lancet Device MISC 1 Device by Does not apply route 3 (three) times a week. 1 each 11  . linagliptin (TRADJENTA) 5 MG TABS tablet Take 1 tablet (5 mg total) by mouth daily. 90 tablet 3  . metoprolol tartrate (LOPRESSOR) 50 MG tablet TAKE ONE TABLET BY MOUTH TWICE A DAY 60 tablet 5  . nitroGLYCERIN (NITROSTAT) 0.4 MG SL tablet PLACE 1 TABLET (0.4 MG TOTAL) UNDER THE TONGUE EVERY 5 MINUTES FOR THREE DOSES AS NEEDED. FOR CHEST PAIN CALL 911 AFTER THAT 25 tablet 0  . nystatin cream (MYCOSTATIN) Apply 1 application topically 2 (two) times daily as needed for dry skin. Use in skin folds for rash 30 g 3  . OLANZapine (ZYPREXA) 10 MG tablet Take 10 mg by mouth at bedtime.  0  . OneTouch Delica Lancets 85I MISC Use to test once daily.  DX code:E11.9 100 each 3  . pravastatin (PRAVACHOL) 40 MG tablet TAKE ONE TABLET BY MOUTH EVERY EVENING 30 tablet 11  . QUEtiapine (SEROQUEL) 50 MG tablet Take 1 tablet (50 mg total) by mouth at bedtime. 90 tablet 3  . senna (SENOKOT) 8.6 MG TABS tablet Take 1 tablet (8.6 mg total) by mouth daily as needed for mild  constipation. 10 each 0  . sodium bicarbonate 650 MG tablet Take 1 tablet (650 mg total) by mouth 2 (two) times daily. 180 tablet 3   No current facility-administered medications for this visit.       ___________________________________________________________________ Objective   Exam:  BP 118/64   Pulse 73   Temp 98.3 F (36.8 C)   Ht '5\' 6"'  (1.676 m)   Wt 257 lb (116.6 kg)   BMI 41.48 kg/m    General: Chronically ill-appearing woman, flattened affect, wheelchair-bound, therefore unable to get on exam table.  Eyes: sclera anicteric, no redness  ENT: oral mucosa moist without lesions, no cervical or supraclavicular lymphadenopathy.  Edentulous  CV: RRR without murmur, S1/S2, no JVD, bilateral pretibial edema  Resp: clear to auscultation bilaterally, normal RR and effort noted  GI: soft, no tenderness, with active bowel sounds.  Morbidly obese, very large pannus, total loss of abdominal wall muscular tone.  Limited exam patient confined to wheelchair  Skin; warm and dry, no rash or jaundice noted  Labs:  CBC Latest Ref Rng & Units 11/12/2018 08/04/2018 03/03/2018  WBC 3.4 - 10.8 x10E3/uL 14.9(H) 14.3(H) 12.9(H)  Hemoglobin 11.1 - 15.9 g/dL 12.4 10.6(L) 10.2(L)  Hematocrit 34.0 - 46.6 % 39.4 33.9(L) 30.0(L)  Platelets 150 - 450 x10E3/uL 322 315 299    CMP Latest Ref Rng & Units 11/12/2018 08/04/2018 02/16/2018  Glucose 65 - 99 mg/dL 124(H) 97 288(H)  BUN 8 - 27 mg/dL 24 26 28(H)  Creatinine 0.57 - 1.00 mg/dL 1.53(H) 1.77(H) 1.84(H)  Sodium 134 - 144 mmol/L 140 141 140  Potassium 3.5 - 5.2 mmol/L 4.1 4.2 3.5  Chloride 96 - 106 mmol/L 105 107(H) 105  CO2 20 - 29 mmol/L 19(L) 19(L) 23  Calcium 8.7 - 10.3 mg/dL 10.2 9.6 8.8(L)  Total Protein 6.0 - 8.5 g/dL 6.8 - -  Total Bilirubin 0.0 - 1.2 mg/dL 0.2 - -  Alkaline Phos 39 - 117 IU/L 104 - -  AST 0 - 40 IU/L 13 - -  ALT 0 - 32 IU/L 8 - -    Feb 2020:  Iron 36, TIBC 298 (12%sat)  Ferritin 186  July 2020    Iron 37,  TIBC 342  (11%sat)  (no ferritin)  No X67 or folic acid levels   Assessment: Encounter Diagnoses  Name Primary?  Marland Kitchen Anemia in chronic kidney disease, unspecified CKD stage Yes  . Morbid obesity ([place])   . Current use of long term anticoagulation   . Chronic constipation     This appears to be anemia of chronic kidney disease that was briefly microcytic but with normal iron levels and then responded to a period of iron replacement with recent normalization of hemoglobin. I do not think this anemia is on the basis of chronic GI blood loss. Although she has not previously had any colon cancer screening, her age, comorbidities, condition and body habitus would make the risks outweigh the expected benefits.  Therefore I do not advocate that she have a colonoscopy. Discussed at length with her and her son, and they are in agreement.  Plan:  I recommended she stop the iron tablets since recent hemoglobin normal, and that primary care check her CBC again in a couple of months.  She might need periodic short courses of iron because her iron observed capacity is likely impaired by her CKD.  Thank you for the courtesy of this consult.  Please call me with any questions or concerns.  (Total 45-minute time, prolonged patient check-in and med reconciliation and history process required, extensive chart review required due to patient's limited ability to give history.  Over half of this time was spent face-to-face with patient and her son) Nelida Meuse III  CC: Referring provider noted above

## 2018-12-24 ENCOUNTER — Other Ambulatory Visit: Payer: Self-pay | Admitting: Cardiovascular Disease

## 2018-12-24 ENCOUNTER — Telehealth: Payer: Self-pay | Admitting: Cardiovascular Disease

## 2018-12-24 NOTE — Telephone Encounter (Signed)
LMOVM (DPR) advising of transmission results per Dr. Victorino December result note for 12/15/18 transmission. Gave direct DC phone number if she has any further questions or concerns.

## 2018-12-24 NOTE — Telephone Encounter (Signed)
Patient called back as she had not yet received my message. Reviewed transmission results. No further questions at this time.

## 2018-12-24 NOTE — Telephone Encounter (Signed)
New message   Patient requesting results from 11/30 remote transmission

## 2018-12-29 ENCOUNTER — Telehealth (INDEPENDENT_AMBULATORY_CARE_PROVIDER_SITE_OTHER): Payer: Medicare Other | Admitting: Family Medicine

## 2018-12-29 ENCOUNTER — Encounter: Payer: Self-pay | Admitting: Family Medicine

## 2018-12-29 ENCOUNTER — Other Ambulatory Visit: Payer: Self-pay

## 2018-12-29 DIAGNOSIS — Z720 Tobacco use: Secondary | ICD-10-CM

## 2018-12-29 DIAGNOSIS — J449 Chronic obstructive pulmonary disease, unspecified: Secondary | ICD-10-CM

## 2018-12-29 DIAGNOSIS — R0981 Nasal congestion: Secondary | ICD-10-CM

## 2018-12-29 DIAGNOSIS — I480 Paroxysmal atrial fibrillation: Secondary | ICD-10-CM

## 2018-12-29 DIAGNOSIS — I10 Essential (primary) hypertension: Secondary | ICD-10-CM

## 2018-12-29 DIAGNOSIS — E119 Type 2 diabetes mellitus without complications: Secondary | ICD-10-CM | POA: Diagnosis not present

## 2018-12-29 DIAGNOSIS — I77811 Abdominal aortic ectasia: Secondary | ICD-10-CM

## 2018-12-29 DIAGNOSIS — F17200 Nicotine dependence, unspecified, uncomplicated: Secondary | ICD-10-CM

## 2018-12-29 NOTE — Assessment & Plan Note (Signed)
Intermittently above goal, possibly due to over-the-counter medication use.  Norvasc at nighttime.  Recommended discontinuing OTC medications

## 2018-12-29 NOTE — Assessment & Plan Note (Signed)
Recommended cessation.  Discussed benefits.

## 2018-12-29 NOTE — Progress Notes (Signed)
[facility] Visit  Patient consented to have virtual visit. Method of visit: Telephone  Encounter participants: Patient: Abigail Wallace - located at home Provider: Martyn Wallace - located at office Others (if applicable): Son- Abigail Wallace   Chief Complaint: AAA, anemia   HPI: Abigail Wallace is a pleasant 73 year old woman with history of type 2 diabetes, tobacco abuse, COPD, atrial fibrillation on apixaban therapy and hypertension presenting via virtual visit for check-in.  Her and her son have a number of concerns.  They are overall doing well.  They are planning to celebrate Christmas holiday pretty much isolating.  They are hesitant to receive the coronavirus vaccine at this time and have many questions about this   Nasal Congestion Reports 2-3 weeks congestion, with non-productive cough. Comes and goes with change in weather.  Son purchased some TheraFlu and noted this had a lot of sugar in it and was elevating her blood pressure.  They stopped this yesterday.  No chest pain, fevers, difficulty breathing  Hypertension Blood pressure have been 130-140. Son noted elevation to 140- 143/73 HR 65 176/98  154/73 HR 65  128/85 HR 90 133/80 HR 80  153/87 164/74 Patient denies chest pain, headaches, syncope. Reports she is taking amlodipine 10 mg in the morning and metoprolol twice daily.  Diabetes In terms of the patient's diabetes she continues to take linagliptin.  Blood sugars have risen over the past week.  They had a whole custard cake last week.  She is also been taking TheraFlu which contain sugar per the ingredient list . 7 day average 156  14 day average 153 30 day average 149  No polyuria polydipsia  Tobacco abuse With increased stress of the holidays the patient smoking 5 to 6 cigarettes/day.  She is present plans about quitting  ROS: per HPI  Pertinent PMHx:  Known abdominal aortic aneurysm, CT earlier in 2019 recommended  follow-up in 5 years.  Discussed change on x-ray imaging, ultrasound ordered.  Son will call to schedule.  Renal cyst, patient has follow-up with urology for this they are uncertain as to why.  Patient has many questions and wonders if she will need a cystectomy.  Under HPI as above  Exam:  Respiratory: Speaking in full sentences.  Temperature 97.6 blood pressure 133/80.  Assessment/Plan:  Essential hypertension Intermittently above goal, possibly due to over-the-counter medication use.  Norvasc at nighttime.  Recommended discontinuing OTC medications  COPD No signs of exacerbation at this time.  Suspect nasal congestion due to mild allergies and possibly heat in home.  Recommended humidifier, honey and nasal saline will call if symptoms worsen or change in any way.  Tobacco abuse Recommended cessation.  Discussed benefits.   History of compression fracture consider Prolia therapy in future given renal disease.  Time spent during visit with patient: 26 minutes  Abigail Singh, MD  [facility] Medicine Teaching Service

## 2018-12-29 NOTE — Assessment & Plan Note (Signed)
No signs of exacerbation at this time.  Suspect nasal congestion due to mild allergies and possibly heat in home.  Recommended humidifier, honey and nasal saline will call if symptoms worsen or change in any way.

## 2019-01-05 ENCOUNTER — Telehealth: Payer: Self-pay

## 2019-01-05 DIAGNOSIS — N3000 Acute cystitis without hematuria: Secondary | ICD-10-CM

## 2019-01-05 MED ORDER — CEPHALEXIN 500 MG PO CAPS: 500.0000 mg | ORAL_CAPSULE | Freq: Every day | ORAL | 0 refills | Status: DC

## 2019-01-05 NOTE — Telephone Encounter (Signed)
Called patient and son.   Son reports increased smoking has increased, cough has worsened. He has been checking vitals at home, afebrile, pulse ox around 96-98%, BP has been good.  He reports mother has been smoking more, watching TV more. Son things sensational news and information sometimes worsens anxiety.   In terms of urinary frequency, reports urgency, frequency, and some burning. No fevers, flank pain.  Rx sent to pharmacy, renally dosed.  [name], MD  Family Medicine Teaching Service

## 2019-01-05 NOTE — Telephone Encounter (Signed)
Pt's son [name] calls with concerns about mother having a UTI. [name] states that mother has been having cloudy urine, painful urination and increased frequency. No fever. Stated that he had discussed with Dr. [name] starting an abx if syms persisted over weekend. Requesting an abx be sent to the pharmacy. Please advise.  Talbot Grumbling, RN

## 2019-01-06 ENCOUNTER — Telehealth: Payer: Self-pay | Admitting: Family Medicine

## 2019-01-06 NOTE — Telephone Encounter (Signed)
Pt is calling because she is thankful for Dr. [name] calling in her some medication for her infection. She is wondering if this will be enough medication? jw

## 2019-01-07 NOTE — Telephone Encounter (Signed)
Called patient to discuss. She is feeling well. She was smoking I called---we discussed cessation. Will call in next week if not improved. If not feeling better, should be seen.

## 2019-01-07 NOTE — Progress Notes (Signed)
PPM remote 

## 2019-01-13 ENCOUNTER — Telehealth: Payer: Self-pay

## 2019-01-13 DIAGNOSIS — M546 Pain in thoracic spine: Secondary | ICD-10-CM

## 2019-01-13 NOTE — Telephone Encounter (Signed)
LMOVM for [name] to return call.   Christen Bame, CMA

## 2019-01-13 NOTE — Telephone Encounter (Signed)
Abigail Wallace (Pts son) calls nurse line requesting advice from provider regarding pt's back and hip pain. Pt states that she has been taking muscle relaxer and is still experiencing severe pain. Son wants to try Salonpas pads for pain and make sure that they are safe to use.   Please advise.   Talbot Grumbling, RN

## 2019-01-13 NOTE — Telephone Encounter (Signed)
Called patient regarding back pain. She reports ~ 4 days of right sided back pain. Denies numbness or weakness in LE. Pain is worse with movement. No falls at home. Patient reports very severe pain. Recommended patient be seen today, tomorrow, or in Urgent Care. Patient prefers to wait. Scheduled for 1/4 with PCP. Recommended patient be seen if pain persists, worsens, or develops other symptoms. Patient asked I call son.   Attempted to call son, unable to reach him at listed number.   If Mr.Isely calls back, okay to use Salonpas. If her pain continues to be severe, recommend being seen in clinic or Urgent Care if no access to care slots available.  [name], MD  Family Medicine Teaching Service

## 2019-01-13 NOTE — Telephone Encounter (Signed)
Talked to patient's son [name]) regarding pain management for patient. Per Dr. [name], patient can use the Salonpas. [name] was agreeable that it would be beneficial for mother to be seen and evaluated. However, he states that mother does not want to be seen. [name] says that he will closely monitor her over the next couple of days and that mom is agreeable to be seen by doctor if symptoms worsen or if she is still having pain on Thursday. Informed [name] that we would be closed Thursday and Friday for the holiday and that if pain was to become severe or if symptoms continue to worsen, patient will need to be seen at Urgent Care. Talbot Grumbling, RN

## 2019-01-14 ENCOUNTER — Other Ambulatory Visit: Payer: Self-pay

## 2019-01-14 MED ORDER — BACLOFEN 5 MG PO TABS: 5.0000 mg | ORAL_TABLET | Freq: Two times a day (BID) | ORAL | 0 refills | Status: DC | PRN

## 2019-01-14 NOTE — Telephone Encounter (Signed)
[name] left message on nurse line regarding pt's back pain. He states that he feels that she pulled a muscle in her back and that the pain is not related to a kidney stone. He says that the pain is relieved by applying pressure to back. He was wondering if they could get a rx for a pressure bandage or wrap for her back that medicare would cover.   Please advise.   To PCP  Talbot Grumbling, RN

## 2019-01-14 NOTE — Telephone Encounter (Signed)
Please call family: Medicare is tricky about covering patches---does not cover most patches etc.  - I do not recommend a brace - Can try Voltaren gel (OTC, generic is okay, called voltaren) or a Lidoderm patch (I would recommend they try this first).  Let me know if you/son has questions.  Thanks! [name], MD  Family Medicine Teaching Service

## 2019-01-14 NOTE — Telephone Encounter (Signed)
Per pharmacy baclofen 5mg  tablets are not covered by patient's insurance. However, 10mg  tablets are covered. Per pharmacy they need the rx to be changed to 10mg  tablets (1/2 pill twice daily).   Talbot Grumbling, RN

## 2019-01-14 NOTE — Telephone Encounter (Signed)
Attempted to call patient back with advice from Dr. [name]. Patient did not answer and was unable to leave message on machine.   Talbot Grumbling, RN

## 2019-01-15 MED ORDER — BACLOFEN 10 MG PO TABS: 10.0000 mg | ORAL_TABLET | Freq: Three times a day (TID) | ORAL | 0 refills | Status: DC

## 2019-01-15 NOTE — Telephone Encounter (Signed)
Called son, discussed back pain. Intermittent, pulling in back, similar to prior. No numbness/weakness/falls/trauma. Ambulating in wheelchair (baseline activity). Has known spinal and right hip OA (severe). Discussed return precautions, will try Voltaren gel alternating with Lidoderm over weekend. Scheduled for follow up.   New Rx sent to [facility] with covered dose. Reviewed side effects with patient and son.  [name], MD  Family Medicine Teaching Service

## 2019-01-15 NOTE — Addendum Note (Signed)
Addended by: [name], Wallie Lagrand on: 01/15/2019 08:52 AM   Modules accepted: Orders

## 2019-01-19 ENCOUNTER — Telehealth: Payer: Self-pay | Admitting: *Deleted

## 2019-01-19 ENCOUNTER — Telehealth: Payer: Self-pay | Admitting: Cardiovascular Disease

## 2019-01-19 ENCOUNTER — Telehealth: Payer: Medicare Other | Admitting: Family Medicine

## 2019-01-19 DIAGNOSIS — I10 Essential (primary) hypertension: Secondary | ICD-10-CM

## 2019-01-19 DIAGNOSIS — G8929 Other chronic pain: Secondary | ICD-10-CM

## 2019-01-19 DIAGNOSIS — N1832 Chronic kidney disease, stage 3b: Secondary | ICD-10-CM

## 2019-01-19 NOTE — Telephone Encounter (Signed)
LMOM for patient to call clinic. Remote transmission showed normal device function and no alerts or events since 09/05/18.

## 2019-01-19 NOTE — Telephone Encounter (Signed)
Spoke with son per DPR and patient became lethargic  3 days after her pain medication was increased. She developed SOB and EMS was called 01/17/19 and checked out by EMS who did not recommend transfer to hospital and reported her VS were "stable" and family chose to have patient remain at home. Patient has become more alert over the past few days and today was able to perform some ADLs for herself is MAE x 4. PCP , Dr [name] has ordered patient to have labs drawn 01/20/19. ED precautions given and encouraged to follow up with PCP.

## 2019-01-19 NOTE — Telephone Encounter (Signed)
Called patient and son back.  Son reports that his mother took a full dose of baclofen on Friday night and then around 3 AM was somnolent.  She also had intermittent changes in her speech.  Evaluated by EMS vital signs neurologic exam normal limits.  Symptoms have remarkably improved since that time she is back to diarrhea.  Her vitals are within normal limits.  Discussed at length recommended obtaining blood work this week to evaluate for change in kidney function or new anemia.  Reviewed reasons to call or return to care.  If patient develops new symptoms or changes in speech again recommend that they can call EMS.  Follow-up visit scheduled on Monday the 11th.  [name], MD  Family Medicine Teaching Service

## 2019-01-19 NOTE — Telephone Encounter (Signed)
New Message:     Pt had an episode early Saturday morning. He wants to know if you can tell whether she went into AFIB or had a stroke?

## 2019-01-19 NOTE — Telephone Encounter (Signed)
[name] (son) called and states that he had to EMS on Saturday morning due to mother being lethargic and her vitals being irregular for her.  States that EMS didn't think she needed to go to ED and wasn't concerned for stroke.  Son is just wanting to be cautious and make sure that there wasn't an issue with her baclofen.  He would also like to know if there is a way to check her stroke via a remote pacemaker check.  Will forward to MD to advise and son plans to call patient's cardiologist about this also.  Chauncy Mangiaracina,CMA

## 2019-01-19 NOTE — Telephone Encounter (Signed)
Transmission received 01-19-2019

## 2019-01-20 ENCOUNTER — Encounter (HOSPITAL_COMMUNITY): Payer: Self-pay | Admitting: Radiology

## 2019-01-20 ENCOUNTER — Other Ambulatory Visit: Payer: Medicare Other

## 2019-01-20 ENCOUNTER — Encounter: Payer: Self-pay | Admitting: Family Medicine

## 2019-01-20 ENCOUNTER — Telehealth: Payer: Self-pay | Admitting: Family Medicine

## 2019-01-20 DIAGNOSIS — M5441 Lumbago with sciatica, right side: Secondary | ICD-10-CM

## 2019-01-20 DIAGNOSIS — G8929 Other chronic pain: Secondary | ICD-10-CM

## 2019-01-20 NOTE — Telephone Encounter (Signed)
Attempted to schedule MR Lumbar Spine w/o Contrast for patient.  Patient has pacemaker and it will need to be reviewed by Radiology prior to scheduling so as  to make sure that an MRI is safe for patient.  Discussed and made aware to Dr. [name] the current status.  Will place call to [place] from Centralized Scheduling to follow up on 01/21/2019.  She is working from home and suggested that I call on that date.  Ozella Almond, [place]

## 2019-01-20 NOTE — Progress Notes (Signed)
Pacemaker is not MRI safe.

## 2019-01-20 NOTE — Telephone Encounter (Signed)
Called son and patient back.  The patient reports her back pain is improved.  She is up and around.  The patient has had worsening back pain now for several months.  This is right-sided and does radiate down her right leg.  Prior imaging shows a possible compression fracture of her lumbar spine.  Weight previously discussed Prolia therapy in the setting of her chronic kidney disease.  Back pain and other patient factors recommend advanced imaging at this time.  Discussed with son at length.  MRI has been ordered  Nursing please help to schedule MRI for sometime on Monday afternoon, January 11.  Patient has an outpatient appointment this day and is  most convenient for the family.  The MRI has been ordered.

## 2019-01-20 NOTE — Telephone Encounter (Signed)
Called and spoke to [name] and relayed message to him concerning his mom and her continued back pain.  [name] states that he nor his mother  wants to go to the ED because they do not receive as good care as they do here at [facility].  [name] states that if the back pain gets unbearable for his mom that he will take her to Urgent Care instead.  [name] understands that Dr. [name] is busy with other patients, but would really still like a call if possible.  Told him that I would relay the message.  Ozella Almond, [place]

## 2019-01-20 NOTE — Telephone Encounter (Signed)
Please call son and let him know that I will attempt to call him this PM. If back pain does not improve this AM or is worse than usual, should go to ED.   [name], MD  Family Medicine Teaching Service

## 2019-01-20 NOTE — Telephone Encounter (Signed)
[name], patients son, calling to speak with Dr. [name]. He says patient cannot make her lab appt for this morning due to her being in so much pain. He would like to know what to do about this. Please call him back at [phone].

## 2019-01-22 ENCOUNTER — Telehealth: Payer: Self-pay | Admitting: Family Medicine

## 2019-01-22 ENCOUNTER — Telehealth: Payer: Self-pay

## 2019-01-22 NOTE — Telephone Encounter (Signed)
Placed call x 2 to [facility] a Centralized scheduling to inquire as to whether patient is safe to get MRI as she has a pacemaker.  LVM for [employer] as she is working remotely.  Awaiting her call.  Ozella Almond, [place]

## 2019-01-22 NOTE — Telephone Encounter (Signed)
Called patient and son about back pain and MRI. Discussed obtaining CT. Discussed risk of incidental findings, next courses if CT positive. They would like to hold off at this time. Back pain improved. All questions answered.  [name], MD  Family Medicine Teaching Service

## 2019-01-26 ENCOUNTER — Encounter: Payer: Self-pay | Admitting: Family Medicine

## 2019-01-26 ENCOUNTER — Ambulatory Visit (INDEPENDENT_AMBULATORY_CARE_PROVIDER_SITE_OTHER): Payer: Medicare Other | Admitting: Family Medicine

## 2019-01-26 ENCOUNTER — Other Ambulatory Visit: Payer: Self-pay | Admitting: Cardiovascular Disease

## 2019-01-26 ENCOUNTER — Other Ambulatory Visit: Payer: Self-pay

## 2019-01-26 VITALS — BP 140/78 | HR 81 | Wt 257.4 lb

## 2019-01-26 DIAGNOSIS — M545 Low back pain: Secondary | ICD-10-CM | POA: Diagnosis not present

## 2019-01-26 DIAGNOSIS — R350 Frequency of micturition: Secondary | ICD-10-CM

## 2019-01-26 DIAGNOSIS — G8929 Other chronic pain: Secondary | ICD-10-CM

## 2019-01-26 DIAGNOSIS — I77811 Abdominal aortic ectasia: Secondary | ICD-10-CM

## 2019-01-26 DIAGNOSIS — I480 Paroxysmal atrial fibrillation: Secondary | ICD-10-CM | POA: Diagnosis not present

## 2019-01-26 DIAGNOSIS — I890 Lymphedema, not elsewhere classified: Secondary | ICD-10-CM

## 2019-01-26 DIAGNOSIS — E119 Type 2 diabetes mellitus without complications: Secondary | ICD-10-CM | POA: Diagnosis not present

## 2019-01-26 DIAGNOSIS — N1832 Chronic kidney disease, stage 3b: Secondary | ICD-10-CM | POA: Diagnosis not present

## 2019-01-26 DIAGNOSIS — E039 Hypothyroidism, unspecified: Secondary | ICD-10-CM | POA: Diagnosis not present

## 2019-01-26 DIAGNOSIS — M549 Dorsalgia, unspecified: Secondary | ICD-10-CM

## 2019-01-26 DIAGNOSIS — D509 Iron deficiency anemia, unspecified: Secondary | ICD-10-CM | POA: Diagnosis not present

## 2019-01-26 DIAGNOSIS — E038 Other specified hypothyroidism: Secondary | ICD-10-CM

## 2019-01-26 LAB — POCT URINALYSIS DIP (MANUAL ENTRY)
Bilirubin, UA: NEGATIVE
Glucose, UA: 100 mg/dL — AB
Ketones, POC UA: NEGATIVE mg/dL
Leukocytes, UA: NEGATIVE
Nitrite, UA: NEGATIVE
Protein Ur, POC: >=300 mg/dL — AB
Spec Grav, UA: 1.015 (ref 1.010–1.025)
Urobilinogen, UA: 0.2 E.U./dL
pH, UA: 6.5 (ref 5.0–8.0)

## 2019-01-26 LAB — POCT UA - MICROSCOPIC ONLY: Epithelial cells, urine per micros: >20

## 2019-01-26 NOTE — Assessment & Plan Note (Signed)
Most consistent with MSK, compression fracture (old) unlikely to be contributing. Discussed further imaging, family would like to hold off as this improving. Differential includes spinal stenosis, DJD (more likely).

## 2019-01-26 NOTE — Assessment & Plan Note (Signed)
Repeat today for monitoring. Denies symptoms or signs of blood loss at this time.

## 2019-01-26 NOTE — Assessment & Plan Note (Signed)
Repeat BMET today, she has follow up with Nephrology (family to reschedule)

## 2019-01-26 NOTE — Assessment & Plan Note (Signed)
Chronic stable, unchanged from prior. L>R edema, monitor.

## 2019-01-26 NOTE — Assessment & Plan Note (Signed)
Repeat ultrasound ordered and scheduled. Discussed with son and patient.

## 2019-01-26 NOTE — Progress Notes (Signed)
Patient Name: Abigail Wallace Date of Birth: 1945/01/19 Date of Visit: 01/26/19 PCP: Martyn Malay, MD  Chief Complaint: back pain, urinary frequency   Subjective: Abigail Wallace is a pleasant 74 y.o. with medical history significant for type 2 diabetes, mood disorder, tobacco abuse, thoracic compression fracture, aortic aneurysm (ectasia, last measured on CT in Jan 2019 at 2.9 cm) and  presenting today for check-in and ongoing back pain.  The patient has had back pain for several years.  For the past several months this has been flaring more frequently.  Over the past 2 weeks she has had severe right-sided pain.  This progressed to the point that she took a baclofen one evening and then had side effects including some change in mental status.  She was evaluated for EMS by this and has since improved.  The patient reports her back pain is since improved.  She endorses some right-sided low back pain at times follows with Tylenol and topical agents.  She took 2 Tylenols prior to her visit today.  After this back pain started she did have an episode of hematuria.  She has had prior episodes of hematuria and follows with urology for this.  Today she endorses urinary frequency and urgency.  No dysuria.  No further hematuria.  See documentation below regarding prior evaluation of hematuria.  Patient reports her diabetes is doing okay.  She is working on reducing her sweets.  The patient reports she continues to smoke about 6 cigarettes a day.  She is cut down considerably from previous.  Denies dyspnea or chest pains.  She has an ongoing nonproductive cough that is unchanged from prior.  No hemoptysis.  The patient has a history of an abdominal aortic aneurysm, last seen on x-ray noted to have increased in size slightly.  Previous CT in 2019 recommended follow-up in 5 years, given the marginal x-ray follow-up was recommended after x-ray.  Discussed with family today they are amenable for ultrasound in   February.   The patient lives with her son.  Her home is one-story.  She uses a wheelchair to get around.  She has been doing some intermittent walking which is improved over the last year.  She is alone about 8 to 12 hours a day at times.  Her family and some check on her constantly.   ROS: Per HPI.   I have reviewed the patient's medical, surgical, family, and social history as appropriate.  Vitals:   01/26/19 1102  BP: 140/78  Pulse: 81  SpO2: 98%   HEENT: Sclera anicteric. Mask present. Appears well hydrated. Neck: Supple Cardiac: Regular rate and rhythm. Normal S1/S2. No murmurs, rubs, or gallops appreciated. Lungs: Clear bilaterally to ascultation. No wheezes or rales  Abdomen: + hernia, soft, non tender  Extremities: Warm, well perfused 1+ right, 2+ left edema, Reduced sensation on monofilament (distal over toes only)  Psych: Pleasant and appropriate    Abdominal aortic ectasia (HCC) Repeat ultrasound ordered and scheduled. Discussed with son and patient.   Iron deficiency anemia Repeat today for monitoring. Denies symptoms or signs of blood loss at this time.   CKD stage IIIb Repeat BMET today, she has follow up with Nephrology (family to reschedule)   Well controlled type 2 diabetes mellitus ([place]) BG have been well controlled per family. Encouraged continued dietary changes.   Chronic back pain Most consistent with MSK, compression fracture (old) unlikely to be contributing. Discussed further imaging, family would like to hold off as  this improving. Differential includes spinal stenosis, DJD (more likely). Will continue to monitor--if worsens consider CT (pacer is not MRI compatible).   Lymphedema of left leg Chronic stable, unchanged from prior. L>R edema, monitor.   Tobacco use, recommended cessation, continue to address.   Urinary frequency, possibly due to painful bladder syndrome, will send for culture. No microscopic hematuria. Follows with Dr. Lovena Neighbours at  [facility] pain may have related to stone/cyst. She has follow up with [facility]. Does not appear to have previously had cystoscopy.   Subclinical hypothyroidism, due for repeat today.   Goals, family and patient wish to focus on small things that are able to be fixed/changed.Keep patient at home and independent with increased ambulation.   Return to care in 3-4 weeks for virtual, due for  A1C at end of month (Consider with [facility]).   [name], MD  Family Medicine Teaching Service

## 2019-01-26 NOTE — Assessment & Plan Note (Signed)
BG have been well controlled per family. Encouraged continued dietary changes.

## 2019-01-26 NOTE — Patient Instructions (Addendum)
It was wonderful to see you today.  Thank you for choosing [facility].   Please call [phone] with any questions about today's appointment.  Please be sure to schedule follow up at the front  desk before you leave today.   [name], MD  Family Medicine     1. Go to the lab today  2. We will call you with the time of your ultrasound  3. I will call you with results of your urine   At the front please schedule a virtual visit in about 3-4 weeks with me   It was great to see you today!!!  Thank you for coming in---- great work with your weight.  Keep trying to reduce extra sweets--- our goal in the spring will be to cut down on tobacco

## 2019-01-27 LAB — BASIC METABOLIC PANEL WITH GFR
BUN/Creatinine Ratio: 17 (ref 12–28)
BUN: 24 mg/dL (ref 8–27)
CO2: 18 mmol/L — ABNORMAL LOW (ref 20–29)
Calcium: 9.6 mg/dL (ref 8.7–10.3)
Chloride: 107 mmol/L — ABNORMAL HIGH (ref 96–106)
Creatinine, Ser: 1.39 mg/dL — ABNORMAL HIGH (ref 0.57–1.00)
GFR calc Af Amer: 43 mL/min/1.73 — ABNORMAL LOW (ref >59)
GFR calc non Af Amer: 37 mL/min/1.73 — ABNORMAL LOW (ref >59)
Glucose: 115 mg/dL — ABNORMAL HIGH (ref 65–99)
Potassium: 3.7 mmol/L (ref 3.5–5.2)
Sodium: 138 mmol/L (ref 134–144)

## 2019-01-27 LAB — CBC
Hematocrit: 37.3 % (ref 34.0–46.6)
Hemoglobin: 12.6 g/dL (ref 11.1–15.9)
MCH: 27.9 pg (ref 26.6–33.0)
MCHC: 33.8 g/dL (ref 31.5–35.7)
MCV: 83 fL (ref 79–97)
Platelets: 309 10*3/uL (ref 150–450)
RBC: 4.52 x10E6/uL (ref 3.77–5.28)
RDW: 14.8 % (ref 11.7–15.4)
WBC: 12.4 10*3/uL — ABNORMAL HIGH (ref 3.4–10.8)

## 2019-01-27 LAB — IRON,TIBC AND FERRITIN PANEL
Ferritin: 52 ng/mL (ref 15–150)
Iron Saturation: 11 % — ABNORMAL LOW (ref 15–55)
Iron: 41 ug/dL (ref 27–139)
Total Iron Binding Capacity: 372 ug/dL (ref 250–450)
UIBC: 331 ug/dL (ref 118–369)

## 2019-01-27 LAB — TSH: TSH: 2.5 u[IU]/mL (ref 0.450–4.500)

## 2019-01-27 LAB — T4, FREE: Free T4: 1.09 ng/dL (ref 0.82–1.77)

## 2019-01-28 ENCOUNTER — Telehealth: Payer: Self-pay

## 2019-01-28 NOTE — Telephone Encounter (Signed)
Attempted to call patient--- all blood work normal. Urine culture still pending.   Left generic voicemail. [name], MD  Family Medicine Teaching Service

## 2019-01-28 NOTE — Telephone Encounter (Signed)
Called with results. CBC shows no anemia stable leukocytosis Creatinine stable, mild acidosis Iron panel WNL  Awaiting urine culture.  Discussed with questions. All questions.   [name], MD  Family Medicine Teaching Service

## 2019-01-28 NOTE — Telephone Encounter (Signed)
Patient calls nurse line requesting results of urine culture. Per chart review, I do not see that these results have come back. I informed patient it usually takes a few days for cultures to grow. Patient states she endorses the "normal UTI sxs," when asked about her sxs. Patient stated she would like an antibiotic called in, however is willing to wait umtil culture results come back.

## 2019-02-03 ENCOUNTER — Other Ambulatory Visit: Payer: Self-pay

## 2019-02-03 DIAGNOSIS — E1165 Type 2 diabetes mellitus with hyperglycemia: Secondary | ICD-10-CM

## 2019-02-03 MED ORDER — LINAGLIPTIN 5 MG PO TABS: 5.0000 mg | ORAL_TABLET | Freq: Every day | ORAL | 3 refills | Status: DC

## 2019-02-03 NOTE — Telephone Encounter (Signed)
Left generic voicemail to check in on symptoms.   [name], MD  Family Medicine Teaching Service

## 2019-02-06 ENCOUNTER — Telehealth: Payer: Self-pay | Admitting: *Deleted

## 2019-02-06 ENCOUNTER — Encounter: Payer: Self-pay | Admitting: Family Medicine

## 2019-02-06 NOTE — Telephone Encounter (Signed)
Pt and son called to let you know she has a rash on her foot. He said he would send you pictures through mychart. Please advise. Atom Solivan Kennon Holter, CMA

## 2019-02-06 NOTE — Telephone Encounter (Signed)
Called patient back--- lesions are non-painful and non-pruritic. Appear to be more ulcer like rather than tinea on image. Possibly also due to trauma/bite. Recommend monitoring---will follow up on Monday with son.  [name], MD  Family Medicine Teaching Service

## 2019-02-09 NOTE — Telephone Encounter (Signed)
Called patient and son to discuss.  Recommended watchful waiting.  Suspect this is more irritation related to recent shoe use and poor healing in setting diabetes we will continue to monitor reviewed signs and symptoms and reasons to return to care.

## 2019-02-16 ENCOUNTER — Ambulatory Visit (HOSPITAL_COMMUNITY)
Admission: RE | Admit: 2019-02-16 | Payer: Medicare Other | Source: Ambulatory Visit | Attending: Family Medicine | Admitting: Family Medicine

## 2019-02-18 ENCOUNTER — Encounter: Payer: Self-pay | Admitting: Family Medicine

## 2019-02-19 ENCOUNTER — Telehealth: Payer: Self-pay | Admitting: Family Medicine

## 2019-02-19 NOTE — Telephone Encounter (Signed)
Called patient to discuss a variety of things. Her foot is not improving but not gotten any worse. It turns out she has gotten her bedroom shoes. Discussed alternative slip on shoes for when she takes her wheelchair to go out and smoke. Will discuss with son tomorrow a.m. The patient also reports ongoing burning with urination every 2 and half weeks. No fevers, back pain nausea or vomiting. She reports otherwise doing well. Will discuss with son dropping off a urine specimen versus empiric treatment for urine infection. All questions were answered.

## 2019-02-20 NOTE — Telephone Encounter (Signed)
Pt's son Selena Lesser, returning phone call from [place], regarding UTI symptoms.   To PCP  Talbot Grumbling, RN

## 2019-02-20 NOTE — Telephone Encounter (Signed)
Called patient and son to discuss. Both think intermittent burning due to low water intake. Will monitor symptoms over weekend, try small 4 ounces of cranberry. Avoid AZO given CKD. Discussed reasons to call/return to care.    [name], MD  Family Medicine Teaching Service

## 2019-02-23 ENCOUNTER — Encounter: Payer: Self-pay | Admitting: Family Medicine

## 2019-02-24 ENCOUNTER — Telehealth: Payer: Self-pay | Admitting: Family Medicine

## 2019-02-24 DIAGNOSIS — N3000 Acute cystitis without hematuria: Secondary | ICD-10-CM

## 2019-02-24 MED ORDER — CEPHALEXIN 500 MG PO CAPS: 500.0000 mg | ORAL_CAPSULE | Freq: Two times a day (BID) | ORAL | 0 refills | Status: DC

## 2019-02-24 NOTE — Telephone Encounter (Signed)
Received MyChart message that patient has had persistent dysuria despite conservative management.  Given duration of symptoms and lack of red flags will treat with cephalexin.  Prescription sent to pharmacy.  Nursing I tried to reach the patient and left a generic voicemail.  Please reach out to her and her son and let them know that the medication is at the pharmacy.  1 capsule twice per day for 5 days.  Please let me know if the concerns with medication or if they have questions.  Thank you, [name], MD  [facility] Medicine Teaching Service

## 2019-02-25 NOTE — Telephone Encounter (Signed)
Called patient and informed her that medication was at pharmacy for pick up.  Patient states that medication has been delivered.  Informed patient and soon to call office if there are any additional questions.  Abigail Wallace, CMA'

## 2019-03-02 ENCOUNTER — Ambulatory Visit (HOSPITAL_COMMUNITY): Payer: Medicare Other

## 2019-03-06 ENCOUNTER — Encounter: Payer: Self-pay | Admitting: Cardiovascular Disease

## 2019-03-06 NOTE — Telephone Encounter (Signed)
Error

## 2019-03-09 ENCOUNTER — Telehealth: Payer: Self-pay | Admitting: Family Medicine

## 2019-03-09 NOTE — Telephone Encounter (Signed)
Called patient---no concerns. Rash is healing. Had to reschedule AAA and was wondering time frame. All questions answered.   If calls back, feel free to leave return call for me.   [name], MD  Family Medicine Teaching Service

## 2019-03-16 ENCOUNTER — Ambulatory Visit (INDEPENDENT_AMBULATORY_CARE_PROVIDER_SITE_OTHER): Payer: Medicare Other | Admitting: *Deleted

## 2019-03-16 DIAGNOSIS — I495 Sick sinus syndrome: Secondary | ICD-10-CM

## 2019-03-16 LAB — CUP PACEART REMOTE DEVICE CHECK
Battery Impedance: 277 Ohm
Battery Remaining Longevity: 97 mo
Battery Voltage: 2.79 V
Brady Statistic AP VP Percent: 62 %
Brady Statistic AP VS Percent: 0 %
Brady Statistic AS VP Percent: 38 %
Brady Statistic AS VS Percent: 0 %
Date Time Interrogation Session: 20210301171312
Implantable Lead Implant Date: 20070414
Implantable Lead Implant Date: 20070914
Implantable Lead Location: 753859
Implantable Lead Location: 753860
Implantable Lead Model: 4092
Implantable Lead Model: 5594
Implantable Pulse Generator Implant Date: 20160816
Lead Channel Impedance Value: 447 Ohm
Lead Channel Impedance Value: 607 Ohm
Lead Channel Pacing Threshold Amplitude: 0.5 V
Lead Channel Pacing Threshold Amplitude: 0.75 V
Lead Channel Pacing Threshold Pulse Width: 0.4 ms
Lead Channel Pacing Threshold Pulse Width: 0.4 ms
Lead Channel Setting Pacing Amplitude: 2 V
Lead Channel Setting Pacing Amplitude: 2.5 V
Lead Channel Setting Pacing Pulse Width: 0.4 ms
Lead Channel Setting Sensing Sensitivity: 4 mV

## 2019-03-17 NOTE — Progress Notes (Signed)
PPM Remote  

## 2019-03-18 ENCOUNTER — Ambulatory Visit (HOSPITAL_COMMUNITY): Payer: Medicare Other

## 2019-03-24 DIAGNOSIS — I129 Hypertensive chronic kidney disease with stage 1 through stage 4 chronic kidney disease, or unspecified chronic kidney disease: Secondary | ICD-10-CM | POA: Diagnosis not present

## 2019-03-24 DIAGNOSIS — N184 Chronic kidney disease, stage 4 (severe): Secondary | ICD-10-CM | POA: Diagnosis not present

## 2019-03-24 DIAGNOSIS — N2581 Secondary hyperparathyroidism of renal origin: Secondary | ICD-10-CM | POA: Diagnosis not present

## 2019-03-24 DIAGNOSIS — N189 Chronic kidney disease, unspecified: Secondary | ICD-10-CM | POA: Diagnosis not present

## 2019-03-24 DIAGNOSIS — D631 Anemia in chronic kidney disease: Secondary | ICD-10-CM | POA: Diagnosis not present

## 2019-03-24 DIAGNOSIS — E559 Vitamin D deficiency, unspecified: Secondary | ICD-10-CM | POA: Diagnosis not present

## 2019-03-27 ENCOUNTER — Telehealth: Payer: Self-pay | Admitting: Family Medicine

## 2019-03-27 NOTE — Telephone Encounter (Signed)
Called patient to check in. Reports she is doing okay. Has had several deaths in family (niece, nephew). Will plan for follow up in April.  [name], MD  Family Medicine Teaching Service

## 2019-03-30 ENCOUNTER — Telehealth: Payer: Self-pay

## 2019-03-30 ENCOUNTER — Encounter: Payer: Self-pay | Admitting: Family Medicine

## 2019-03-30 NOTE — Telephone Encounter (Signed)
Please call [name] and let him know we will get this at appointment in April.   Thank you, [name], MD  [facility] Medicine Teaching Service

## 2019-03-30 NOTE — Telephone Encounter (Signed)
Pt's son ([name]), calls nurse line regarding A1C. Patient has not had A1C since 11/12/18 (6.3) [name] calls to see if patient needs to be brought in for lab before scheduled appointment on 4/19 with Dr. [name].   To PCP  Please advise  Talbot Grumbling, RN

## 2019-03-31 DIAGNOSIS — R0789 Other chest pain: Secondary | ICD-10-CM | POA: Diagnosis not present

## 2019-03-31 DIAGNOSIS — Z888 Allergy status to other drugs, medicaments and biological substances status: Secondary | ICD-10-CM | POA: Diagnosis not present

## 2019-03-31 DIAGNOSIS — Z7901 Long term (current) use of anticoagulants: Secondary | ICD-10-CM | POA: Diagnosis not present

## 2019-03-31 DIAGNOSIS — Z950 Presence of cardiac pacemaker: Secondary | ICD-10-CM | POA: Diagnosis not present

## 2019-03-31 DIAGNOSIS — Z7984 Long term (current) use of oral hypoglycemic drugs: Secondary | ICD-10-CM | POA: Diagnosis not present

## 2019-03-31 DIAGNOSIS — M79602 Pain in left arm: Secondary | ICD-10-CM | POA: Diagnosis not present

## 2019-03-31 DIAGNOSIS — G8929 Other chronic pain: Secondary | ICD-10-CM | POA: Diagnosis not present

## 2019-03-31 DIAGNOSIS — I10 Essential (primary) hypertension: Secondary | ICD-10-CM | POA: Diagnosis not present

## 2019-03-31 DIAGNOSIS — R079 Chest pain, unspecified: Secondary | ICD-10-CM | POA: Diagnosis not present

## 2019-03-31 DIAGNOSIS — F17200 Nicotine dependence, unspecified, uncomplicated: Secondary | ICD-10-CM | POA: Diagnosis not present

## 2019-03-31 DIAGNOSIS — Z79899 Other long term (current) drug therapy: Secondary | ICD-10-CM | POA: Diagnosis not present

## 2019-03-31 DIAGNOSIS — R0989 Other specified symptoms and signs involving the circulatory and respiratory systems: Secondary | ICD-10-CM | POA: Diagnosis not present

## 2019-03-31 DIAGNOSIS — E119 Type 2 diabetes mellitus without complications: Secondary | ICD-10-CM | POA: Diagnosis not present

## 2019-03-31 DIAGNOSIS — F209 Schizophrenia, unspecified: Secondary | ICD-10-CM | POA: Diagnosis not present

## 2019-03-31 NOTE — Telephone Encounter (Signed)
Patients son contacted and informed the patients A1C will be done in April. The phone was passed to the patient who asked about a pelvic exam at April visit as well. Patient stated she has a hard time getting on the exam tables. I advised her if a pelvic is needed we should be able to work out the adjustable table.   Of note, they were headed to the ED. They did not state why.

## 2019-04-03 ENCOUNTER — Telehealth: Payer: Self-pay | Admitting: Family Medicine

## 2019-04-03 ENCOUNTER — Other Ambulatory Visit: Payer: Self-pay | Admitting: *Deleted

## 2019-04-03 DIAGNOSIS — E1165 Type 2 diabetes mellitus with hyperglycemia: Secondary | ICD-10-CM

## 2019-04-03 MED ORDER — GLUCOSE BLOOD VI STRP: ORAL_STRIP | 12 refills | Status: DC

## 2019-04-03 NOTE — Telephone Encounter (Signed)
Rx request for one touch verio test strips. Jeannetta Cerutti Kennon Holter, CMA

## 2019-04-06 NOTE — Telephone Encounter (Signed)
Called patient as she left voicemail on nurse line.  She requested additional therapy test that would not be good medical care to continue to prescribe antibiotics.  She is having vaginal pain.  Discussed at length.  Her son is to call back tomorrow morning to discuss neck steps and schedule earliest convenient appointment.  [name], MD  Family Medicine Teaching Service

## 2019-04-07 ENCOUNTER — Encounter: Payer: Self-pay | Admitting: Family Medicine

## 2019-04-07 ENCOUNTER — Telehealth: Payer: Self-pay | Admitting: Family Medicine

## 2019-04-07 NOTE — Telephone Encounter (Signed)
Called patient to check in. Discussed care with [name].  Patient has had no fevers, back pain. Will hold off on Rx for antibiotic at this time. [name] to fax results from ED visit.   Recent stressors:  - Aunt shared news with Abigail Wallace was out of town   [name] has concerns about dementia. Discussed possibility of Geriatric clinic---will discuss further in person at follow up on 4/19.  [name], MD  Family Medicine Teaching Service

## 2019-04-13 ENCOUNTER — Ambulatory Visit (HOSPITAL_COMMUNITY)
Admission: RE | Admit: 2019-04-13 | Discharge: 2019-04-13 | Disposition: A | Discharge status: Home / Self Care | Payer: Medicare Other | Source: Ambulatory Visit | Attending: Internal Medicine | Admitting: Internal Medicine

## 2019-04-13 ENCOUNTER — Other Ambulatory Visit: Payer: Self-pay

## 2019-04-13 DIAGNOSIS — I77811 Abdominal aortic ectasia: Secondary | ICD-10-CM | POA: Diagnosis not present

## 2019-04-14 ENCOUNTER — Telehealth: Payer: Self-pay

## 2019-04-14 NOTE — Telephone Encounter (Signed)
Abigail Wallace calls nurse line to discuss vascular ultrasound results with [name].

## 2019-04-14 NOTE — Telephone Encounter (Signed)
Called patient with results--- aorta 3.4 cm at largest--per [facility] guidelines repeat every 2-3 years. Updated chart. Also called son with information.   All questions answered.  [name], MD  Family Medicine Teaching Service

## 2019-04-20 ENCOUNTER — Encounter: Payer: Self-pay | Admitting: Cardiovascular Disease

## 2019-04-20 ENCOUNTER — Ambulatory Visit (INDEPENDENT_AMBULATORY_CARE_PROVIDER_SITE_OTHER): Payer: Medicare Other | Admitting: Cardiovascular Disease

## 2019-04-20 ENCOUNTER — Other Ambulatory Visit: Payer: Self-pay

## 2019-04-20 VITALS — BP 137/74 | HR 75 | Wt 254.6 lb

## 2019-04-20 DIAGNOSIS — E782 Mixed hyperlipidemia: Secondary | ICD-10-CM | POA: Diagnosis not present

## 2019-04-20 DIAGNOSIS — I480 Paroxysmal atrial fibrillation: Secondary | ICD-10-CM

## 2019-04-20 DIAGNOSIS — E669 Obesity, unspecified: Secondary | ICD-10-CM

## 2019-04-20 DIAGNOSIS — I495 Sick sinus syndrome: Secondary | ICD-10-CM | POA: Diagnosis not present

## 2019-04-20 DIAGNOSIS — Z950 Presence of cardiac pacemaker: Secondary | ICD-10-CM

## 2019-04-20 DIAGNOSIS — I5032 Chronic diastolic (congestive) heart failure: Secondary | ICD-10-CM

## 2019-04-20 DIAGNOSIS — E1169 Type 2 diabetes mellitus with other specified complication: Secondary | ICD-10-CM

## 2019-04-20 DIAGNOSIS — G4733 Obstructive sleep apnea (adult) (pediatric): Secondary | ICD-10-CM | POA: Diagnosis not present

## 2019-04-20 DIAGNOSIS — I714 Abdominal aortic aneurysm, without rupture, unspecified: Secondary | ICD-10-CM

## 2019-04-20 DIAGNOSIS — I442 Atrioventricular block, complete: Secondary | ICD-10-CM | POA: Diagnosis not present

## 2019-04-20 DIAGNOSIS — I10 Essential (primary) hypertension: Secondary | ICD-10-CM | POA: Diagnosis not present

## 2019-04-20 NOTE — Patient Instructions (Signed)
Medication Instructions:  No changes *If you need a refill on your cardiac medications before your next appointment, please call your pharmacy*   Lab Work: Your provider would like for you to have the following labs today: Lipid, A1C, and CMET  If you have labs (blood work) drawn today and your tests are completely normal, you will receive your results only by: Marland Kitchen MyChart Message (if you have MyChart) OR . A paper copy in the mail If you have any lab test that is abnormal or we need to change your treatment, we will call you to review the results.   Testing/Procedures: Your physician has requested that you have an abdominal aorta duplex in 12 months. During this test, an ultrasound is used to evaluate the aorta. Allow 30 minutes for this exam. Do not eat after midnight the day before and avoid carbonated beverages. This will take place at [address], [address].    Follow-Up: At [facility], you and your health needs are our priority.  As part of our continuing mission to provide you with exceptional heart care, we have created designated Provider Care Teams.  These Care Teams include your primary Cardiologist (physician) and Advanced Practice Providers (APPs -  Physician Assistants and Nurse Practitioners) who all work together to provide you with the care you need, when you need it.  We recommend signing up for the patient portal called "MyChart".  Sign up information is provided on this After Visit Summary.  MyChart is used to connect with patients for Virtual Visits (Telemedicine).  Patients are able to view lab/test results, encounter notes, upcoming appointments, etc.  Non-urgent messages can be sent to your provider as well.   To learn more about what you can do with MyChart, go to [email or web address].    Your next appointment:   6 month(s)  The format for your next appointment:   In Person  Provider:   Sanda Klein, MD

## 2019-04-20 NOTE — Progress Notes (Signed)
Patient ID: Abigail Wallace, female   DOB: 1945-05-27, 74 y.o.   MRN: [id number]    Cardiology Office Note    Date:  04/21/2019   ID:  Abigail Wallace 03-16-1945, MRN [id number]  PCP:  Abigail Malay, MD  Cardiologist:   Abigail Klein, MD   Chief Complaint  Patient presents with  . Pacemaker Check  . Atrial Fibrillation    History of Present Illness:  Abigail Wallace is a 74 y.o. female with complete heart block and sinus node dysfunction who presents for atrial fibrillation and a pacemaker check. As always, her son Abigail Wallace accompanies her today.  Other significant medical problems include obstructive sleep apnea, morbid obesity, AAA, mixed hyperlipidemia and history of schizophrenia.  She is generally doing well, but remains extremely sedentary.  Denies angina or dyspnea at rest or with intermittent activity that she performs.  Has not had any problems with palpitations, syncope, worsening edema of the extremities or claudication.  She is compliant with CPAP.    She has a small infrarenal abdominal aortic aneurysm that was measured with a diameter of 3.4 cm.  This area of the aorta measured 2.9 cm on CT of the abdomen.  Has recently increased from 4 cigarettes to 8 cigarettes a day.  Patient remains morbidly obese although she has gained any additional weight since her last appointment.  Glycemic control is excellent with an A1c of 6% (check today) but she continues to have moderate hypertriglyceridemia.  The lipid parameters are otherwise acceptable but borderline low HDL and an LDL of 80.  She does not have known symptomatic peripheral coronary atherosclerotic lesions.  Her pacemaker is a dual-chamber Medtronic Adapta device implanted in August 2016 as a generator change out. She is pacemaker dependent due to complete heart block (she has an idioventricular escape rhythm around 30 bpm). She also has significant sinus bradycardia.  Device check showed 61% atrial pacing and virtually 100%  ventricular pacing.  The burden of atrial fibrillation is quite low she did have a 25-hour episode on January 28.  She initially received a dual-chamber permanent pacemaker in 2007 for symptomatic AV block. She has minimal coronary atherosclerosis by cardiac catheterization performed in 2007 and no evidence of insufficiency by nuclear perfusion testing in 2012. By echocardiography she has normal left ventricular size and systolic function and no major structural cardiac abnormalities.    Past Medical History:  Diagnosis Date  . Abdominal wall hernia 01/2017  . Atrial fibrillation ([place])   . CKD (chronic kidney disease)   . COPD (chronic obstructive pulmonary disease) ([place])   . Coronary artery disease    NON-CRITICAL  . Ectasis aorta ([place])   . GERD (gastroesophageal reflux disease)   . Gout   . Hypertension   . Memory difficulties 12/02/2013  . Psychosis ([place])   . Tardive dyskinesia 12/02/2013  . Type 2 diabetes mellitus ([place])     Past Surgical History:  Procedure Laterality Date  . CARDIAC CATHETERIZATION  2007   Non-critical.   . CHOLECYSTECTOMY    . ELBOW SURGERY    . EP IMPLANTABLE DEVICE N/A 08/31/2014   Procedure: PPM Generator Changeout;  Surgeon: Abigail Klein, MD;  Location: [facility];  Service: Cardiovascular;  Laterality: N/A;  . INSERT / REPLACE / REMOVE PACEMAKER  2007   [place]/SYMPTOMATIC HEART BLOCK  . PACEMAKER PLACEMENT  09/28/2005   2/2 SSS?  . Persantine stress test  05/01/2010   EF 66%. Normal LV sys fx.  Unchanged from previous studies.   . TRANSTHORACIC ECHOCARDIOGRAM  09/08/10   SEVERE CONCENTRIC HYPERTROPHY.LV FUNCTION WAS VIGOROUS.EF 65%-70%.VENTRICULAR SEPTUM-INCOORDINATE MOTION.LEFT ATRIUM-MILDLY DILATED.TRIVIAL TR.  . TUBAL LIGATION      Outpatient Medications Prior to Visit  Medication Sig Dispense Refill  . acetaminophen (TYLENOL) 500 MG tablet Take 1,000 mg by mouth at bedtime.     Marland Kitchen albuterol (PROVENTIL HFA;VENTOLIN HFA) 108 (90  Base) MCG/ACT inhaler Inhale 2 puffs into the lungs every 6 (six) hours as needed for wheezing or shortness of breath. 1 Inhaler 2  . allopurinol (ZYLOPRIM) 100 MG tablet TAKE ONE TABLET BY MOUTH DAILY 30 tablet 5  . amLODipine (NORVASC) 10 MG tablet Take 1 tablet (10 mg total) by mouth daily. 90 tablet 3  . Blood Glucose Monitoring Suppl (ONETOUCH VERIO) w/Device KIT 1 Device by Does not apply route 3 (three) times a week. 1 kit 1  . diclofenac Sodium (VOLTAREN) 1 % GEL Use 4 times daily to affected area 100 g 4  . divalproex (DEPAKOTE ER) 250 MG 24 hr tablet Take 20 mg by mouth at bedtime.    Marland Kitchen ELIQUIS 5 MG TABS tablet TAKE ONE TABLET BY MOUTH TWICE A DAY 60 tablet 10  . glucose blood test strip Test each morning before breakfast 100 each 12  . Incontinence Supplies MISC 1 Units by Does not apply route as needed. 100 each prn  . Lancet Device MISC 1 Device by Does not apply route 3 (three) times a week. 1 each 11  . linagliptin (TRADJENTA) 5 MG TABS tablet Take 1 tablet (5 mg total) by mouth daily. 90 tablet 3  . metoprolol tartrate (LOPRESSOR) 50 MG tablet TAKE ONE TABLET BY MOUTH TWICE A DAY 60 tablet 5  . nitroGLYCERIN (NITROSTAT) 0.4 MG SL tablet PLACE 1 TABLET (0.4 MG TOTAL) UNDER THE TONGUE EVERY 5 MINUTES FOR THREE DOSES AS NEEDED. FOR CHEST PAIN CALL 911 AFTER THAT 25 tablet 0  . nystatin cream (MYCOSTATIN) Apply 1 application topically 2 (two) times daily as needed for dry skin. Use in skin folds for rash 30 g 3  . OLANZapine (ZYPREXA) 10 MG tablet Take 10 mg by mouth at bedtime.  0  . OneTouch Delica Lancets 30Z MISC Use to test once daily.  DX code:E11.9 100 each 3  . pravastatin (PRAVACHOL) 40 MG tablet TAKE ONE TABLET BY MOUTH EVERY EVENING 30 tablet 11  . QUEtiapine (SEROQUEL) 50 MG tablet Take 1 tablet (50 mg total) by mouth at bedtime. 90 tablet 3  . senna (SENOKOT) 8.6 MG TABS tablet Take 1 tablet (8.6 mg total) by mouth daily as needed for mild constipation. 10 each 0  . sodium  bicarbonate 650 MG tablet Take 1 tablet (650 mg total) by mouth 2 (two) times daily. 180 tablet 3  . cephALEXin (KEFLEX) 500 MG capsule Take 1 capsule (500 mg total) by mouth 2 (two) times daily. (Patient not taking: Reported on 04/20/2019) 10 capsule 0   No facility-administered medications prior to visit.     Allergies:   Abilify [aripiprazole], Clozapine, Benadryl [diphenhydramine hcl], Cogentin [benztropine], Haloperidol lactate, Latuda [lurasidone hcl], Remeron [mirtazapine], Codeine, and Latex   Social History   Socioeconomic History  . Marital status: Divorced    Spouse name: Not on file  . Number of children: Not on file  . Years of education: Not on file  . Highest education level: Not on file  Occupational History  . Not on file  Tobacco Use  . Smoking  status: Current Every Day Smoker    Packs/day: 0.25    Types: Cigarettes  . Smokeless tobacco: Never Used  . Tobacco comment: 6 cigs a day  Substance and Sexual Activity  . Alcohol use: No    Alcohol/week: 0.0 standard drinks  . Drug use: No  . Sexual activity: Not on file  Other Topics Concern  . Not on file  Social History Narrative   Lives alone. Son brings to appointments ([name])    Children: 2 daughters, 1 son; 26 GC, 5 GGC.    Occupation: disabled.    Caffeine:   Tobacco: 5-6/day   Denies alcohol.    Social Determinants of Health   Financial Resource Strain:   . Difficulty of Paying Living Expenses:   Food Insecurity:   . Worried About Charity fundraiser in the Last Year:   . Arboriculturist in the Last Year:   Transportation Needs:   . Film/video editor (Medical):   Marland Kitchen Lack of Transportation (Non-Medical):   Physical Activity:   . Days of Exercise per Week:   . Minutes of Exercise per Session:   Stress:   . Feeling of Stress :   Social Connections:   . Frequency of Communication with Friends and Family:   . Frequency of Social Gatherings with Friends and Family:   . Attends Religious Services:    . Active Member of Clubs or Organizations:   . Attends Archivist Meetings:   Marland Kitchen Marital Status:      Family History:  The patient's family history includes Diabetes type II in her son; Heart disease in her father and mother; Stroke in her sister.   ROS:   Please see the history of present illness.    All other systems reviewed and are negative.   PHYSICAL EXAM:   VS:  BP 137/74   Pulse 75   Wt 254 lb 9.6 oz (115.5 kg)   SpO2 96%   BMI 41.09 kg/m     General: Alert, oriented x3, no distress, morbidly obese, well-healed pacemaker site Head: no evidence of trauma, PERRL, EOMI, no exophtalmos or lid lag, no myxedema, no xanthelasma; normal ears, nose and oropharynx Neck: normal jugular venous pulsations and no hepatojugular reflux; brisk carotid pulses without delay and no carotid bruits Chest: clear to auscultation, no signs of consolidation by percussion or palpation, normal fremitus, symmetrical and full respiratory excursions Cardiovascular: normal position and quality of the apical impulse, regular rhythm, normal first and paradoxically split second heart sounds, no murmurs, rubs or gallops Abdomen: no tenderness or distention, no masses by palpation, no abnormal pulsatility or arterial bruits, normal bowel sounds, no hepatosplenomegaly Extremities: no clubbing, cyanosis or edema; 2+ radial, ulnar and brachial pulses bilaterally; 2+ right femoral, posterior tibial and dorsalis pedis pulses; 2+ left femoral, posterior tibial and dorsalis pedis pulses; no subclavian or femoral bruits Neurological: grossly nonfocal Psych: Normal mood and affect    Wt Readings from Last 3 Encounters:  04/20/19 254 lb 9.6 oz (115.5 kg)  01/26/19 257 lb 6.4 oz (116.8 kg)  12/17/18 257 lb (116.6 kg)      Studies/Labs Reviewed:   EKG:  EKG is ordered today.  It shows AV sequential pacing.  QTC 472 ms. Recent Labs: 01/26/2019: Hemoglobin 12.6; Platelets 309; TSH 2.500 04/20/2019: ALT 7;  BUN 20; Creatinine, Ser 1.58; Potassium 4.2; Sodium 142  Lipid Panel     Component Value Date/Time   CHOL 170 04/20/2019 1209  TRIG 320 (H) 04/20/2019 1209   HDL 38 (L) 04/20/2019 1209   CHOLHDL 4.5 (H) 04/20/2019 1209   CHOLHDL 2.7 11/19/2010 0605   VLDL 15 11/19/2010 0605   LDLCALC 80 04/20/2019 1209   LDLDIRECT 80 03/20/2012 1510     ASSESSMENT:    1. Paroxysmal atrial fibrillation (HCC)   2. Abdominal aortic aneurysm (AAA) without rupture ([place])   3. Chronic diastolic heart failure ([place])   4. Essential hypertension   5. SSS (sick sinus syndrome) (HCC)   6. CHB (complete heart block) (HCC)   7. Pacemaker   8. Mixed hyperlipidemia   9. Morbid obesity ([place])   10. Diabetes mellitus type 2 in obese ([place])   11. OSA (obstructive sleep apnea)      PLAN:  In order of problems listed above:  1. Afib: as before she has rare and asymptomatic episodes of atrial fibrillation but these are lengthy when they occurred and she should be on anticoagulation.  CHADSVasc 6-7 (age 64, gender, DM, HTN, HF, +/-PAD). 2. AAA: Monitor with yearly ultrasound 3. CHF: Clinically euvolemic to assess functional status due to her very sedentary lifestyle. 4. HTN: Control is acceptable 5. SSS: Rate histograms appear appropriate towards her sedentary 6. CHB: Pacemaker dependent.  Has a slow idioventricular escape rhythm that is not reliable. 7. PPM: Continue remote downloads every 3 months 8. HLP: Worsened hypertriglyceridemia, LDL borderline acceptable.  With the presence of AAA looks like LDL less than 70 (she has not tolerated more potent statins).  Focus on reducing sweets and starches in her diet. 9. Deconditioning: Spends most of his day in his wheelchair. 10. Obesity: She has made some improvements in the last couple of years for the reason it was obese. 11. DM:  Control is good. 12. OSA: compliant w CPAP   Medication Adjustments/Labs and Tests Ordered: Current medicines are reviewed at length  with the patient today.  Concerns regarding medicines are outlined above.  Medication changes, Labs and Tests ordered today are listed in the Patient Instructions below. Patient Instructions  Medication Instructions:  No changes *If you need a refill on your cardiac medications before your next appointment, please call your pharmacy*   Lab Work: Your provider would like for you to have the following labs today: Lipid, A1C, and CMET  If you have labs (blood work) drawn today and your tests are completely normal, you will receive your results only by: Marland Kitchen MyChart Message (if you have MyChart) OR . A paper copy in the mail If you have any lab test that is abnormal or we need to change your treatment, we will call you to review the results.   Testing/Procedures: Your physician has requested that you have an abdominal aorta duplex in 12 months. During this test, an ultrasound is used to evaluate the aorta. Allow 30 minutes for this exam. Do not eat after midnight the day before and avoid carbonated beverages. This will take place at [place], [address].    Follow-Up: At [facility], you and your health needs are our priority.  As part of our continuing mission to provide you with exceptional heart care, we have created designated Provider Care Teams.  These Care Teams include your primary Cardiologist (physician) and Advanced Practice Providers (APPs -  Physician Assistants and Nurse Practitioners) who all work together to provide you with the care you need, when you need it.  We recommend signing up for the patient portal called "MyChart".  Sign up information  is provided on this After Visit Summary.  MyChart is used to connect with patients for Virtual Visits (Telemedicine).  Patients are able to view lab/test results, encounter notes, upcoming appointments, etc.  Non-urgent messages can be sent to your provider as well.   To learn more about what you can do with MyChart, go to  [email or web address].    Your next appointment:   6 month(s)  The format for your next appointment:   In Person  Provider:   Sanda Klein, MD        Signed, Abigail Klein, MD  04/21/2019 8:57 AM    [address], [place], [place]  [address] Phone: [phone]; Fax: [phone]

## 2019-04-21 ENCOUNTER — Encounter: Payer: Self-pay | Admitting: Cardiovascular Disease

## 2019-04-21 LAB — COMPREHENSIVE METABOLIC PANEL
ALT: 7 IU/L (ref 0–32)
AST: 13 IU/L (ref 0–40)
Albumin/Globulin Ratio: 1.9 (ref 1.2–2.2)
Albumin: 4.2 g/dL (ref 3.7–4.7)
Alkaline Phosphatase: 100 IU/L (ref 39–117)
BUN/Creatinine Ratio: 13 (ref 12–28)
BUN: 20 mg/dL (ref 8–27)
Bilirubin Total: <0.2 mg/dL (ref 0.0–1.2)
CO2: 18 mmol/L — ABNORMAL LOW (ref 20–29)
Calcium: 9.7 mg/dL (ref 8.7–10.3)
Chloride: 109 mmol/L — ABNORMAL HIGH (ref 96–106)
Creatinine, Ser: 1.58 mg/dL — ABNORMAL HIGH (ref 0.57–1.00)
GFR calc Af Amer: 36 mL/min/{1.73_m2} — ABNORMAL LOW (ref >59)
GFR calc non Af Amer: 32 mL/min/{1.73_m2} — ABNORMAL LOW (ref >59)
Globulin, Total: 2.2 g/dL (ref 1.5–4.5)
Glucose: 113 mg/dL — ABNORMAL HIGH (ref 65–99)
Potassium: 4.2 mmol/L (ref 3.5–5.2)
Sodium: 142 mmol/L (ref 134–144)
Total Protein: 6.4 g/dL (ref 6.0–8.5)

## 2019-04-21 LAB — HEMOGLOBIN A1C
Est. average glucose Bld gHb Est-mCnc: 126 mg/dL
Hgb A1c MFr Bld: 6 % — ABNORMAL HIGH (ref 4.8–5.6)

## 2019-04-21 LAB — LIPID PANEL
Chol/HDL Ratio: 4.5 ratio — ABNORMAL HIGH (ref 0.0–4.4)
Cholesterol, Total: 170 mg/dL (ref 100–199)
HDL: 38 mg/dL — ABNORMAL LOW (ref >39)
LDL Chol Calc (NIH): 80 mg/dL (ref 0–99)
Triglycerides: 320 mg/dL — ABNORMAL HIGH (ref 0–149)
VLDL Cholesterol Cal: 52 mg/dL — ABNORMAL HIGH (ref 5–40)

## 2019-04-22 ENCOUNTER — Telehealth: Payer: Self-pay | Admitting: Family Medicine

## 2019-04-22 NOTE — Telephone Encounter (Signed)
Called patient to discuss elevated TG. Discussed dietary changes. Patient will work on cutting down on sweets (eating a few moon pies might have contributed she thinks).  Congratulated on A1C.   [name], MD  Family Medicine Teaching Service

## 2019-04-30 ENCOUNTER — Telehealth: Payer: Self-pay

## 2019-04-30 NOTE — Telephone Encounter (Signed)
Agree with below.   Moriah Shawley, MD  Family Medicine Teaching Service   

## 2019-04-30 NOTE — Telephone Encounter (Signed)
Patient's son calls nurse line regarding patient having cough for the last three days. Calling to see if she can take Diabetic Robitussin with other medications. Spoke with Dr. [name], advised that patient can take medication as well as a little honey as needed. Son reports that all vitals have been WNL and that he is believing that patient is having allergies due to increased pollen levels. Advised if patient's symptoms worsen that patient should receive COVID testing. Patient has not received vaccination.   ED precautions given.   FYI to PCP  Talbot Grumbling, RN

## 2019-05-04 ENCOUNTER — Encounter: Payer: Self-pay | Admitting: Family Medicine

## 2019-05-04 ENCOUNTER — Other Ambulatory Visit: Payer: Self-pay

## 2019-05-04 ENCOUNTER — Ambulatory Visit (INDEPENDENT_AMBULATORY_CARE_PROVIDER_SITE_OTHER): Payer: Medicare Other | Admitting: Family Medicine

## 2019-05-04 VITALS — BP 160/80 | HR 80 | Ht 66.0 in | Wt 248.4 lb

## 2019-05-04 DIAGNOSIS — N1832 Chronic kidney disease, stage 3b: Secondary | ICD-10-CM | POA: Diagnosis not present

## 2019-05-04 DIAGNOSIS — N952 Postmenopausal atrophic vaginitis: Secondary | ICD-10-CM | POA: Diagnosis not present

## 2019-05-04 DIAGNOSIS — L89311 Pressure ulcer of right buttock, stage 1: Secondary | ICD-10-CM | POA: Diagnosis not present

## 2019-05-04 DIAGNOSIS — B91 Sequelae of poliomyelitis: Secondary | ICD-10-CM | POA: Diagnosis not present

## 2019-05-04 DIAGNOSIS — E119 Type 2 diabetes mellitus without complications: Secondary | ICD-10-CM

## 2019-05-04 DIAGNOSIS — Z993 Dependence on wheelchair: Secondary | ICD-10-CM | POA: Diagnosis not present

## 2019-05-04 DIAGNOSIS — I10 Essential (primary) hypertension: Secondary | ICD-10-CM | POA: Diagnosis not present

## 2019-05-04 DIAGNOSIS — E782 Mixed hyperlipidemia: Secondary | ICD-10-CM

## 2019-05-04 DIAGNOSIS — M6281 Muscle weakness (generalized): Secondary | ICD-10-CM

## 2019-05-04 DIAGNOSIS — Z720 Tobacco use: Secondary | ICD-10-CM

## 2019-05-04 NOTE — Progress Notes (Signed)
SUBJECTIVE:   CHIEF COMPLAINT / HPI:   Abigail Wallace is a pleasant 74 year old with history significant for atrial fibrillation on Eliquis, sick sinus syndrome with a pacemaker in place, type 2 diabetes, dyslipidemia, tobacco abuse and chronic kidney disease stage III presenting today with her son for variety of concerns.  Patient has had ongoing dysuria and urinary frequency for several years.  She frequently has what she describes as urinary tract infections although the culture is often negative.  Today she denies dysuria or hematuria.  She does endorse intermittent pain when wiping.  No vaginal bleeding.  The patient continues to smoke between 7-8 cigarettes/day.  This is increased from prior.  She endorses that dry cough unchanged from prior.  Denies dyspnea or chest pain today.  The patient has lost 6 pounds her last visit.  She is cut out her cherry tablets in the morning.  And is working on a improved diet.  She denies polyuria or polydipsia.  She has been walking just a few times a week.  The patient does report an episode of chest pain about a month and a half ago.  This lasted several seconds but was on and off for the weekend that her son was gone.  She had associated anxiety.  This was sharp and lasted seconds.  No associated dyspnea.  This is resolved at this time.  PERTINENT  PMH / PSH/Family/Social History :  Tobacco abuse with pulmonary nodules, discussed repeat CT imaging in the past we will continue to address.  Discussed potential risks Anemia, improved previously discussed colonoscopy and declined Hypertension not at goal today CKD stage III follows with nephrology Atrial fibrillation, follows with cardiology  OBJECTIVE:   BP (!) 160/80   Pulse 80   Ht 5\' 6"  (1.676 m)   Wt 248 lb 6.4 oz (112.7 kg)   SpO2 95%   BMI 40.09 kg/m   HEENT: Sclera anicteric. Dentition is moderate. Appears well hydrated. Slight tremor, bilateral lid lag  Neck: Supple Cardiac:  Regular rate and rhythm. Normal S1/S2. No murmurs, rubs, or gallops appreciated. Lungs: Clear bilaterally to ascultation, trace wheeze on left, transmitted upper airway noises  Abdomen: Normoactive bowel sounds. No tenderness to deep or light palpation. No rebound or guarding.  Palpable hernia present, easily reduced.  Chaperone external genitourinary exam notable for vaginal atrophy without lesions.  There is no discharge or lesions.  There is a small appearing hemorrhoid near rectal area.    ASSESSMENT/PLAN:   CKD stage IIIb Reviewed recent labs, has follow up with Dr. Posey Pronto. Taking sodium bicarbonate.   Dependence on wheelchair Encouraged increase ambulation, discussed safe transfer. Shower chair for transfer with seatbelt ordered.   Tobacco abuse Encouraged cessation, agreed to reduce from 7 to 5 cigarettes per day.   Pressure sore on buttocks Resolved on reevaluation today.   Essential hypertension Above goal, discussed at length. Recently increased metoprolol with mild side effects. HR would allow for room to go on this (also has pacemaker).   Agreed to make dietary changes, reduce cigarettes. Follow up scheduled. If elevated at follow up, increase metoprolol vs. Add back losartan.   Mixed hyperlipidemia Discussed dietary changes   Vaginal atrophy No significant prolapse, no lesions, no ulcers. No skin breakdown. Encouraged continued hygiene, possible source of intermittent dysuria given thinning of labia.  Consider small amount topical estrogen if becomes very bothersome.    Anxiety  Likely cause of CP. Reviewed return precautions Her son had some  prior concerns about memory changes.  We discussed.  Offered referral to geriatrics clinic for further evaluation.  They will continue to consider.  If we notice worsening of her memory or change in functional status we will plan for referral.  Leukocytosis, continue to monitor, repeat CBC at follow up.   HCM  Discuss DEXA, CT  chest at follow up  At follow upon phone: Cigarettes and BP   At 3 month follow up: A1C, lipids, CBC (for leukocytosis and anemia)  [name], MD  [place]

## 2019-05-04 NOTE — Assessment & Plan Note (Signed)
Encouraged increase ambulation, discussed safe transfer. Shower chair for transfer with seatbelt ordered.

## 2019-05-04 NOTE — Assessment & Plan Note (Signed)
Discussed dietary changes.

## 2019-05-04 NOTE — Assessment & Plan Note (Signed)
Encouraged cessation, agreed to reduce from 7 to 5 cigarettes per day.

## 2019-05-04 NOTE — Assessment & Plan Note (Signed)
No significant prolapse, no lesions, no ulcers. No skin breakdown. Encouraged continued hygiene, possible source of intermittent dysuria given thinning of labia.  Consider small amount topical estrogen if becomes very bothersome.

## 2019-05-04 NOTE — Patient Instructions (Addendum)
Goals:  5 cigarettes per day  Walks a few steps every day  I will order your shower chair    It was wonderful to see you today.  Please bring ALL of your medications with you to every visit.   Thank you for choosing [facility].   Please call [phone] with any questions about today's appointment.  Please be sure to schedule follow up at the front  desk before you leave today.   [name], MD  Family Medicine

## 2019-05-04 NOTE — Assessment & Plan Note (Signed)
Above goal, discussed at length. Recently increased metoprolol with mild side effects. HR would allow for room to go on this (also has pacemaker).   Agreed to make dietary changes, reduce cigarettes. Follow up scheduled. If elevated at follow up, increase metoprolol vs. Add back losartan.

## 2019-05-04 NOTE — Assessment & Plan Note (Signed)
Resolved on reevaluation today.

## 2019-05-04 NOTE — Assessment & Plan Note (Signed)
Reviewed recent labs, has follow up with Dr. Posey Pronto. Taking sodium bicarbonate.

## 2019-05-06 LAB — MICROALBUMIN / CREATININE URINE RATIO
Creatinine, Urine: 26.2 mg/dL
Microalb/Creat Ratio: 2365 mg/g{creat} — ABNORMAL HIGH (ref 0–29)
Microalbumin, Urine: 619.7 ug/mL

## 2019-05-07 ENCOUNTER — Telehealth: Payer: Self-pay | Admitting: Family Medicine

## 2019-05-07 NOTE — Telephone Encounter (Signed)
Called patient to discuss. Macro albuminuria, consider ARB in future. Patient reports intermittent nocturnal cough. No chest pain or dyspnea. Recommend flonase + albuterol. Reviewed return precautions with patient and son. Monitor for symptoms of COPD exacerbation.   [name], MD  Family Medicine Teaching Service

## 2019-05-13 ENCOUNTER — Telehealth: Payer: Self-pay | Admitting: *Deleted

## 2019-05-13 DIAGNOSIS — J42 Unspecified chronic bronchitis: Secondary | ICD-10-CM

## 2019-05-13 DIAGNOSIS — J440 Chronic obstructive pulmonary disease with acute lower respiratory infection: Secondary | ICD-10-CM

## 2019-05-13 DIAGNOSIS — J209 Acute bronchitis, unspecified: Secondary | ICD-10-CM

## 2019-05-13 MED ORDER — ALBUTEROL SULFATE (2.5 MG/3ML) 0.083% IN NEBU: 2.5000 mg | INHALATION_SOLUTION | Freq: Four times a day (QID) | RESPIRATORY_TRACT | 6 refills | Status: DC | PRN

## 2019-05-13 MED ORDER — ANORO ELLIPTA 62.5-25 MCG/INH IN AEPB: 1.0000 | INHALATION_SPRAY | Freq: Every day | RESPIRATORY_TRACT | 12 refills | Status: DC

## 2019-05-13 NOTE — Telephone Encounter (Signed)
Rx request for albuterol sulfate 0.083% sol and anoro ellipta 62.5-25mcg inha. Please advise.Kashlynn Kundert Kennon Holter, CMA

## 2019-05-13 NOTE — Telephone Encounter (Signed)
Refills to pharmacy.  [name], MD  Family Medicine Teaching Service

## 2019-05-14 DIAGNOSIS — F200 Paranoid schizophrenia: Secondary | ICD-10-CM | POA: Diagnosis not present

## 2019-05-18 ENCOUNTER — Telehealth: Payer: Self-pay | Admitting: Family Medicine

## 2019-05-18 DIAGNOSIS — R8290 Unspecified abnormal findings in urine: Secondary | ICD-10-CM

## 2019-05-18 NOTE — Telephone Encounter (Signed)
Selena Lesser (Patient's son), calls nurse line regarding dark, foul smelling urine with increased frequency. Son denies fever or significant pain.   Spoke with Dr. Ardelia Mems regarding patient. Provider placed orders for urinalysis. Son informed.   ED precautions given.   Talbot Grumbling, RN

## 2019-05-18 NOTE — Telephone Encounter (Signed)
Patient's son called RN line complaining of patient having dark foul smelling urine. No other symptoms. Patient refuses to come in to clinic to see anyone but Dr. [name] (patient with history of paranoid schizophrenia) but Dr. [name] is on vacation this week. I gave okay for son to bring in a clean catch sample from home.   If UA positive will get urine culture.  Chrisandra Netters, MD [place]

## 2019-05-19 ENCOUNTER — Other Ambulatory Visit (INDEPENDENT_AMBULATORY_CARE_PROVIDER_SITE_OTHER): Payer: Medicare Other | Admitting: *Deleted

## 2019-05-19 DIAGNOSIS — R8290 Unspecified abnormal findings in urine: Secondary | ICD-10-CM | POA: Diagnosis not present

## 2019-05-19 LAB — POCT URINALYSIS DIP (MANUAL ENTRY)
Bilirubin, UA: NEGATIVE
Blood, UA: NEGATIVE
Glucose, UA: 100 mg/dL — AB
Ketones, POC UA: NEGATIVE mg/dL
Leukocytes, UA: NEGATIVE
Nitrite, UA: NEGATIVE
Protein Ur, POC: 100 mg/dL — AB
Spec Grav, UA: 1.015 (ref 1.010–1.025)
Urobilinogen, UA: 0.2 E.U./dL
pH, UA: 7 (ref 5.0–8.0)

## 2019-05-19 LAB — POCT UA - MICROSCOPIC ONLY

## 2019-05-19 NOTE — Telephone Encounter (Signed)
Urinalysis results do not suggest infection If patient having other symptoms should be seen and evaluated Please inform son  Thanks Leeanne Rio, MD

## 2019-05-19 NOTE — Telephone Encounter (Signed)
Called and spoke with patient's son, and informed of below. Per son, urine does not seem to be as dark as yesterday. He states that he has been getting patient to drink more water (8 bottles/day).   Advised patient to return to office if symptoms continue or if there is worsening of symptoms.   [name] verbalized understanding and is Patent attorney.   Will follow up with PCP

## 2019-05-20 ENCOUNTER — Other Ambulatory Visit: Payer: Self-pay | Admitting: Cardiovascular Disease

## 2019-05-29 ENCOUNTER — Other Ambulatory Visit: Payer: Self-pay

## 2019-05-29 ENCOUNTER — Other Ambulatory Visit: Payer: Self-pay | Admitting: *Deleted

## 2019-05-29 ENCOUNTER — Telehealth (INDEPENDENT_AMBULATORY_CARE_PROVIDER_SITE_OTHER): Payer: Medicare Other | Admitting: Family Medicine

## 2019-05-29 ENCOUNTER — Encounter: Payer: Self-pay | Admitting: Family Medicine

## 2019-05-29 DIAGNOSIS — F17200 Nicotine dependence, unspecified, uncomplicated: Secondary | ICD-10-CM

## 2019-05-29 DIAGNOSIS — I10 Essential (primary) hypertension: Secondary | ICD-10-CM

## 2019-05-29 DIAGNOSIS — E119 Type 2 diabetes mellitus without complications: Secondary | ICD-10-CM

## 2019-05-29 MED ORDER — METOPROLOL TARTRATE 50 MG PO TABS: 100.0000 mg | ORAL_TABLET | Freq: Two times a day (BID) | ORAL | 5 refills | Status: DC

## 2019-05-29 MED ORDER — ALLOPURINOL 100 MG PO TABS: 100.0000 mg | ORAL_TABLET | Freq: Every day | ORAL | 3 refills | Status: DC

## 2019-05-29 MED ORDER — AMLODIPINE BESYLATE 10 MG PO TABS: 10.0000 mg | ORAL_TABLET | Freq: Every day | ORAL | 3 refills | Status: DC

## 2019-05-29 NOTE — Assessment & Plan Note (Signed)
Congratulated on making small changes.  Her last triglyceride level was elevated.  We discussed management of length.  She agreed to make some dietary changes and we will follow-up in 1 week to check her blood pressure.  Also reviewed her glucoses at that time.

## 2019-05-29 NOTE — Progress Notes (Signed)
[facility] Visit  Patient consented to have virtual visit and was identified by name and date of birth. Method of visit: Telephone  Encounter participants: Patient: Abigail Wallace - located at home Provider: Martyn Malay - located at [facility] Others (if applicable): Son  Chief Complaint: BP check   HPI:  Abigail Wallace is a pleasant 74 year old woman with history significant for post polio syndrome with resulting lower extremity weakness, obesity, degenerative disc disease of the back, type 2 diabetes, tobacco abuse, hypertension presenting today for virtual visit for blood pressure check.  She is joined by her son for this visit she reports overall she is doing okay.  Blood glucose Patient reports she is eating 1 cherry turnover and having yogurt and orange for breakfast.  She has been having dessert most nights of the week.  Morning blood glucoses over the past 7 days average 163.  162 over the last 14 days.  She has not been mobilizing.  She is amenable to reducing the change of numbers to 3/week at most from 7/week.  She denies polyuria polydipsia she does not need medication refills  Blood pressure Patient's blood pressure remains elevated at home.  Most readings are greater than 150 over 90s.  She denies dizziness, chest pain, symptoms of fatigue.  She is currently on maximal dose amlodipine and metoprolol she is taking 75 mg twice daily.  She also has a pacemaker in place importantly.  Heart rates have been in the 60s 70s.  Typically her heart rates run in the 70s.  Cigarettes The patient is smoking 6 to 7 cigarettes a day.  She is amenable to going down to 6 cigarettes in the next week and following up.  ROS: per HPI  Pertinent PMHx: Type 2 diabetes, obesity, tobacco abuse, chronic cough.  Exam:  BP (!) 158/69   Pulse 87   SpO2 96%   Respiratory: Speaking in full sentences breathing comfortably on room air  Assessment/Plan:  Tobacco  use disorder Discussed recommended cutting down.  We discussed the long-term health risks.  Consider low-dose CT in the future.  Well controlled type 2 diabetes mellitus ([place]) Congratulated on making small changes.  Her last triglyceride level was elevated.  We discussed management of length.  She agreed to make some dietary changes and we will follow-up in 1 week to check her blood pressure.  Also reviewed her glucoses at that time.  Essential hypertension Increase metoprolol to 100 mg twice daily. Discussed side effects. Follow up in 1 week.    CKD and Gouty Arthritis, stable, refilled allopurinol.   Time spent during visit with patient: 15 minutes

## 2019-05-29 NOTE — Assessment & Plan Note (Signed)
Discussed recommended cutting down.  We discussed the long-term health risks.  Consider low-dose CT in the future.

## 2019-05-29 NOTE — Assessment & Plan Note (Signed)
Increase metoprolol to 100 mg twice daily. Discussed side effects. Follow up in 1 week.

## 2019-06-04 ENCOUNTER — Telehealth: Payer: Self-pay | Admitting: Family Medicine

## 2019-06-04 NOTE — Telephone Encounter (Signed)
[facility] aware and will process accordingly. Christen Bame, CMA

## 2019-06-04 NOTE — Telephone Encounter (Signed)
Called patient to check in.  Reports her blood glucose and blood pressure improved.   She has not yet received her shower chair--nursing- can you please check with DME provider on status of chair?  Patient would like call back from provider on Monday. Reminder to self.  [name], MD  Family Medicine Teaching Service

## 2019-06-04 NOTE — Telephone Encounter (Signed)
Community message sent to [employer] and Darlina Guys @ [facility] to process DME order for shower chair.   Christen Bame, CMA

## 2019-06-05 NOTE — Telephone Encounter (Signed)
Son calls nurse line stating he would like to be present for the Monday follow-up call to patient. He will be available with patient on Monday around 430p.

## 2019-06-08 ENCOUNTER — Encounter: Payer: Self-pay | Admitting: Family Medicine

## 2019-06-08 NOTE — Telephone Encounter (Signed)
Called patient and son.  [name] reports decrease in energy X 2 months. Unsure of timing of onset relative to medication changes. Unsure of sleep at nighttime---gets up to use bathroom and has difficulty sleeping at times. Sleeps 5-6 hours per night. No dyspnea or chest pain. Reviewed returns precautions--keep sleep diary, increase daytime stimulation.   [name], MD  Family Medicine Teaching Service

## 2019-06-17 ENCOUNTER — Other Ambulatory Visit: Payer: Self-pay | Admitting: Family Medicine

## 2019-06-17 ENCOUNTER — Telehealth: Payer: Self-pay

## 2019-06-17 ENCOUNTER — Encounter: Payer: Self-pay | Admitting: Family Medicine

## 2019-06-17 ENCOUNTER — Ambulatory Visit (INDEPENDENT_AMBULATORY_CARE_PROVIDER_SITE_OTHER): Payer: Medicare Other | Admitting: *Deleted

## 2019-06-17 DIAGNOSIS — M1711 Unilateral primary osteoarthritis, right knee: Secondary | ICD-10-CM

## 2019-06-17 DIAGNOSIS — I442 Atrioventricular block, complete: Secondary | ICD-10-CM | POA: Diagnosis not present

## 2019-06-17 DIAGNOSIS — I480 Paroxysmal atrial fibrillation: Secondary | ICD-10-CM

## 2019-06-17 DIAGNOSIS — I5032 Chronic diastolic (congestive) heart failure: Secondary | ICD-10-CM

## 2019-06-17 DIAGNOSIS — G14 Postpolio syndrome: Secondary | ICD-10-CM

## 2019-06-17 NOTE — Telephone Encounter (Signed)
Community message sent to [employer] for DME orders. Will await response.   Talbot Grumbling, RN

## 2019-06-18 ENCOUNTER — Telehealth: Payer: Self-pay

## 2019-06-18 DIAGNOSIS — G8929 Other chronic pain: Secondary | ICD-10-CM

## 2019-06-18 DIAGNOSIS — I5032 Chronic diastolic (congestive) heart failure: Secondary | ICD-10-CM

## 2019-06-18 DIAGNOSIS — I480 Paroxysmal atrial fibrillation: Secondary | ICD-10-CM

## 2019-06-18 DIAGNOSIS — M545 Low back pain, unspecified: Secondary | ICD-10-CM

## 2019-06-18 NOTE — Telephone Encounter (Signed)
Half rail ordered. Please send to [facility].   Thank you! [name], MD  Family Medicine Teaching Service

## 2019-06-18 NOTE — Telephone Encounter (Signed)
Abigail Wallace calls nurse line stating his mom does not have the hand strength to lift bedside rail for hospital bed. Abigail Wallace spoke with [facility] and they are suggesting a "half rail," instead. Please place a new DME order for "half rail" bedside rail for hospital bed.

## 2019-06-19 LAB — CUP PACEART REMOTE DEVICE CHECK
Battery Impedance: 350 Ohm
Battery Remaining Longevity: 89 mo
Battery Voltage: 2.79 V
Brady Statistic AP VP Percent: 71 %
Brady Statistic AP VS Percent: 0 %
Brady Statistic AS VP Percent: 29 %
Brady Statistic AS VS Percent: 0 %
Date Time Interrogation Session: 20210604063232
Implantable Lead Implant Date: 20070414
Implantable Lead Implant Date: 20070914
Implantable Lead Location: 753859
Implantable Lead Location: 753860
Implantable Lead Model: 4092
Implantable Lead Model: 5594
Implantable Pulse Generator Implant Date: 20160816
Lead Channel Impedance Value: 493 Ohm
Lead Channel Impedance Value: 615 Ohm
Lead Channel Pacing Threshold Amplitude: 0.5 V
Lead Channel Pacing Threshold Amplitude: 0.875 V
Lead Channel Pacing Threshold Pulse Width: 0.4 ms
Lead Channel Pacing Threshold Pulse Width: 0.4 ms
Lead Channel Setting Pacing Amplitude: 2 V
Lead Channel Setting Pacing Amplitude: 2.5 V
Lead Channel Setting Pacing Pulse Width: 0.4 ms
Lead Channel Setting Sensing Sensitivity: 4 mV

## 2019-06-19 NOTE — Telephone Encounter (Signed)
[employer] messaged with update. Will await response.   Talbot Grumbling, RN

## 2019-06-22 ENCOUNTER — Encounter: Payer: Self-pay | Admitting: Family Medicine

## 2019-06-22 DIAGNOSIS — L0100 Impetigo, unspecified: Secondary | ICD-10-CM

## 2019-06-22 MED ORDER — MUPIROCIN 2 % EX OINT: 1.0000 "application " | TOPICAL_OINTMENT | Freq: Two times a day (BID) | CUTANEOUS | 0 refills | Status: DC

## 2019-06-23 NOTE — Progress Notes (Signed)
Remote pacemaker transmission.   

## 2019-06-24 ENCOUNTER — Telehealth: Payer: Self-pay

## 2019-06-24 NOTE — Telephone Encounter (Signed)
Patient calls nurse line stating Abigail Wallace had some concerns with Bactroban prescription. Patient could not really tell me his concerns, she just kept saying, "he wants to make sure its safe for me, he is worried." I saw where PCP has communicated Bactroban with Abigail Wallace via [place], unsure if this has been resolved. Will forward to PCP.

## 2019-06-24 NOTE — Telephone Encounter (Signed)
Attempted to call X1---went straight to voicemail. Left generic VM to call back. If calls back-- it is fine to use bactroban.   [name], MD  Family Medicine Teaching Service

## 2019-06-25 ENCOUNTER — Emergency Department (HOSPITAL_COMMUNITY): Payer: Medicare Other

## 2019-06-25 ENCOUNTER — Encounter (HOSPITAL_COMMUNITY): Payer: Self-pay

## 2019-06-25 ENCOUNTER — Emergency Department (HOSPITAL_COMMUNITY)
Admission: EM | Admit: 2019-06-25 | Discharge: 2019-06-26 | Disposition: A | Discharge status: Home / Self Care | Payer: Medicare Other | Attending: Emergency Medicine | Admitting: Emergency Medicine

## 2019-06-25 DIAGNOSIS — Z20822 Contact with and (suspected) exposure to covid-19: Secondary | ICD-10-CM | POA: Insufficient documentation

## 2019-06-25 DIAGNOSIS — E1122 Type 2 diabetes mellitus with diabetic chronic kidney disease: Secondary | ICD-10-CM | POA: Diagnosis not present

## 2019-06-25 DIAGNOSIS — Z950 Presence of cardiac pacemaker: Secondary | ICD-10-CM | POA: Insufficient documentation

## 2019-06-25 DIAGNOSIS — N281 Cyst of kidney, acquired: Secondary | ICD-10-CM | POA: Diagnosis not present

## 2019-06-25 DIAGNOSIS — Z79899 Other long term (current) drug therapy: Secondary | ICD-10-CM | POA: Insufficient documentation

## 2019-06-25 DIAGNOSIS — F17210 Nicotine dependence, cigarettes, uncomplicated: Secondary | ICD-10-CM | POA: Diagnosis not present

## 2019-06-25 DIAGNOSIS — R4701 Aphasia: Secondary | ICD-10-CM | POA: Diagnosis present

## 2019-06-25 DIAGNOSIS — L98499 Non-pressure chronic ulcer of skin of other sites with unspecified severity: Secondary | ICD-10-CM | POA: Diagnosis not present

## 2019-06-25 DIAGNOSIS — S31109A Unspecified open wound of abdominal wall, unspecified quadrant without penetration into peritoneal cavity, initial encounter: Secondary | ICD-10-CM

## 2019-06-25 DIAGNOSIS — I130 Hypertensive heart and chronic kidney disease with heart failure and stage 1 through stage 4 chronic kidney disease, or unspecified chronic kidney disease: Secondary | ICD-10-CM | POA: Diagnosis not present

## 2019-06-25 DIAGNOSIS — I2510 Atherosclerotic heart disease of native coronary artery without angina pectoris: Secondary | ICD-10-CM | POA: Diagnosis not present

## 2019-06-25 DIAGNOSIS — K439 Ventral hernia without obstruction or gangrene: Secondary | ICD-10-CM | POA: Insufficient documentation

## 2019-06-25 DIAGNOSIS — R059 Cough, unspecified: Secondary | ICD-10-CM

## 2019-06-25 DIAGNOSIS — F29 Unspecified psychosis not due to a substance or known physiological condition: Secondary | ICD-10-CM | POA: Diagnosis not present

## 2019-06-25 DIAGNOSIS — R05 Cough: Secondary | ICD-10-CM | POA: Insufficient documentation

## 2019-06-25 DIAGNOSIS — R1084 Generalized abdominal pain: Secondary | ICD-10-CM

## 2019-06-25 DIAGNOSIS — I5032 Chronic diastolic (congestive) heart failure: Secondary | ICD-10-CM | POA: Insufficient documentation

## 2019-06-25 DIAGNOSIS — N189 Chronic kidney disease, unspecified: Secondary | ICD-10-CM | POA: Diagnosis not present

## 2019-06-25 DIAGNOSIS — R4781 Slurred speech: Secondary | ICD-10-CM | POA: Diagnosis not present

## 2019-06-25 DIAGNOSIS — I10 Essential (primary) hypertension: Secondary | ICD-10-CM | POA: Diagnosis not present

## 2019-06-25 DIAGNOSIS — R29810 Facial weakness: Secondary | ICD-10-CM | POA: Diagnosis not present

## 2019-06-25 DIAGNOSIS — R0602 Shortness of breath: Secondary | ICD-10-CM | POA: Diagnosis not present

## 2019-06-25 DIAGNOSIS — I714 Abdominal aortic aneurysm, without rupture: Secondary | ICD-10-CM | POA: Diagnosis not present

## 2019-06-25 DIAGNOSIS — N134 Hydroureter: Secondary | ICD-10-CM | POA: Diagnosis not present

## 2019-06-25 DIAGNOSIS — R0902 Hypoxemia: Secondary | ICD-10-CM | POA: Diagnosis not present

## 2019-06-25 LAB — COMPREHENSIVE METABOLIC PANEL
ALT: 10 U/L (ref 0–44)
AST: 18 U/L (ref 15–41)
Albumin: 3.5 g/dL (ref 3.5–5.0)
Alkaline Phosphatase: 83 U/L (ref 38–126)
Anion gap: 11 (ref 5–15)
BUN: 28 mg/dL — ABNORMAL HIGH (ref 8–23)
CO2: 18 mmol/L — ABNORMAL LOW (ref 22–32)
Calcium: 9.7 mg/dL (ref 8.9–10.3)
Chloride: 108 mmol/L (ref 98–111)
Creatinine, Ser: 1.6 mg/dL — ABNORMAL HIGH (ref 0.44–1.00)
GFR calc Af Amer: 36 mL/min — ABNORMAL LOW (ref >60)
GFR calc non Af Amer: 31 mL/min — ABNORMAL LOW (ref >60)
Glucose, Bld: 159 mg/dL — ABNORMAL HIGH (ref 70–99)
Potassium: 3.9 mmol/L (ref 3.5–5.1)
Sodium: 137 mmol/L (ref 135–145)
Total Bilirubin: 0.5 mg/dL (ref 0.3–1.2)
Total Protein: 6.5 g/dL (ref 6.5–8.1)

## 2019-06-25 LAB — CBC WITH DIFFERENTIAL/PLATELET
Abs Immature Granulocytes: 0.14 10*3/uL — ABNORMAL HIGH (ref 0.00–0.07)
Basophils Absolute: 0.1 10*3/uL (ref 0.0–0.1)
Basophils Relative: 1 %
Eosinophils Absolute: 0.3 10*3/uL (ref 0.0–0.5)
Eosinophils Relative: 2 %
HCT: 41.2 % (ref 36.0–46.0)
Hemoglobin: 12.9 g/dL (ref 12.0–15.0)
Immature Granulocytes: 1 %
Lymphocytes Relative: 30 %
Lymphs Abs: 4.1 10*3/uL — ABNORMAL HIGH (ref 0.7–4.0)
MCH: 27.3 pg (ref 26.0–34.0)
MCHC: 31.3 g/dL (ref 30.0–36.0)
MCV: 87.3 fL (ref 80.0–100.0)
Monocytes Absolute: 0.9 10*3/uL (ref 0.1–1.0)
Monocytes Relative: 7 %
Neutro Abs: 8.2 10*3/uL — ABNORMAL HIGH (ref 1.7–7.7)
Neutrophils Relative %: 59 %
Platelets: 304 10*3/uL (ref 150–400)
RBC: 4.72 MIL/uL (ref 3.87–5.11)
RDW: 15.1 % (ref 11.5–15.5)
WBC: 13.8 10*3/uL — ABNORMAL HIGH (ref 4.0–10.5)
nRBC: 0 % (ref 0.0–0.2)

## 2019-06-25 LAB — LIPASE, BLOOD: Lipase: 60 U/L — ABNORMAL HIGH (ref 11–51)

## 2019-06-25 LAB — TROPONIN I (HIGH SENSITIVITY)
Troponin I (High Sensitivity): 10 ng/L (ref <18)
Troponin I (High Sensitivity): 10 ng/L (ref <18)

## 2019-06-25 LAB — ETHANOL: Alcohol, Ethyl (B): <10 mg/dL (ref <10)

## 2019-06-25 LAB — BRAIN NATRIURETIC PEPTIDE: B Natriuretic Peptide: 147.6 pg/mL — ABNORMAL HIGH (ref 0.0–100.0)

## 2019-06-25 MED ORDER — SODIUM CHLORIDE 0.9 % IV BOLUS
250.0000 mL | Freq: Once | INTRAVENOUS | Status: AC
  Administered 2019-06-25: 250 mL via INTRAVENOUS

## 2019-06-25 MED ORDER — IOHEXOL 300 MG/ML  SOLN
80.0000 mL | Freq: Once | INTRAMUSCULAR | Status: AC | PRN
  Administered 2019-06-25: 80 mL via INTRAVENOUS

## 2019-06-25 NOTE — ED Notes (Signed)
Patient transported to CT 

## 2019-06-25 NOTE — Telephone Encounter (Signed)
Called patient. She reports she is overall fine---last week had an episode of 'drooling'. Previously reports due to Psychiatric medications changes. No numbness, speech changes. [name] (son) is on his way---will call back later today to check in.   Discussed ED precautions.  [name], MD  Family Medicine Teaching Service

## 2019-06-25 NOTE — ED Triage Notes (Signed)
LSW 1800 last night

## 2019-06-25 NOTE — Telephone Encounter (Signed)
[name] LVM on nurse line asking to speak with PCP. I tried calling him back, however no answer. Will forward to [employer].

## 2019-06-25 NOTE — ED Provider Notes (Signed)
[facility] EMERGENCY DEPARTMENT Provider Note   CSN: [id number] Arrival date & time: 06/25/19  1732     History Chief Complaint  Patient presents with  . Aphasia  . Abdominal Pain  . Shortness of Breath    Abigail Wallace is a 74 y.o. female with a past medical history of COPD, CAD, tardive dyskinesia, DM 2, memory difficulties, pacemaker in place who presents today with her son for evaluation of multiple complaints. Patient's son reports that when last spoke with patient at 3:30 PM today her speech was normal, and when he spoke with her after a "for 30 her speech was different.  He states that it will change when she gets really anxious or worked up about something, the last time it changed like this was about a week ago.  He states that it will last for 3 to 4 hours and then go away.  That itself is not changed or different.  He states that she is very anxious.  She feels like she has to pause before she answers something.  Patient's son states that again that is something that intermittently happens stating that "it comes and goes."  She reports worsening cough and shortness of breath.  Both patient and her son are unable to tell me when this episode started, simply stating that it comes and goes.  Patient reports increased cough.  Reports compliance with medications.  Triage note reports that they had stated trouble breathing and abdominal pain worsening weeks.  Patient has baseline weakness in her legs from having polio as a child.   Patient has had a wound over her abdomen worsening over about the past week.  She has additionally had abdominal pain.    HPI     Past Medical History:  Diagnosis Date  . Abdominal wall hernia 01/2017  . Atrial fibrillation ([place])   . CKD (chronic kidney disease)   . COPD (chronic obstructive pulmonary disease) ([place])   . Coronary artery disease    NON-CRITICAL  . Ectasis aorta ([place])   . GERD (gastroesophageal reflux disease)    . Gout   . Hypertension   . Memory difficulties 12/02/2013  . Psychosis ([place])   . Tardive dyskinesia 12/02/2013  . Type 2 diabetes mellitus [facility])     Patient Active Problem List   Diagnosis Date Noted  . Vaginal atrophy 05/04/2019  . Chronic back pain 11/14/2018  . Tobacco abuse 08/11/2018  . Pulmonary nodules 03/03/2018  . Morbid obesity ([place]) 02/07/2018  . Paroxysmal atrial fibrillation ([place]) 01/28/2018  . Dependence on wheelchair 05/17/2017  . Abdominal aortic ectasia ([place]) 01/16/2017  . Renal cyst 01/16/2017  . Long-term use of high-risk medication 10/11/2016  . CKD stage IIIb 09/12/2016  . Tobacco use disorder 05/10/2014  . Essential hypertension 04/22/2013  . Well controlled type 2 diabetes mellitus ([place]) 03/20/2012  . Gout 09/26/2011  . Pacemaker 03/30/2010  . Vaginal prolapse 03/27/2010  . Sleep apnea 03/27/2010  . Osteoarthritis of right knee 03/27/2010  . Resting tremor 12/15/2009  . Chronic diastolic heart failure ([place]) 09/30/2009  . Sick sinus syndrome ([place]) 04/24/2006  . Mixed hyperlipidemia 03/14/2006  . Paranoid schizophrenia ([place]) 03/14/2006  . COPD 03/14/2006    Past Surgical History:  Procedure Laterality Date  . CARDIAC CATHETERIZATION  2007   Non-critical.   . CHOLECYSTECTOMY    . ELBOW SURGERY    . EP IMPLANTABLE DEVICE N/A 08/31/2014   Procedure: PPM Generator Changeout;  Surgeon: Sanda Klein, MD;  Location: [facility];  Service: Cardiovascular;  Laterality: N/A;  . INSERT / REPLACE / REMOVE PACEMAKER  2007   [place]/SYMPTOMATIC HEART BLOCK  . PACEMAKER PLACEMENT  09/28/2005   2/2 SSS?  . Persantine stress test  05/01/2010   EF 66%. Normal LV sys fx. Unchanged from previous studies.   . TRANSTHORACIC ECHOCARDIOGRAM  09/08/10   SEVERE CONCENTRIC HYPERTROPHY.LV FUNCTION WAS VIGOROUS.EF 65%-70%.VENTRICULAR SEPTUM-INCOORDINATE MOTION.LEFT ATRIUM-MILDLY DILATED.TRIVIAL TR.  . TUBAL LIGATION       OB History   No obstetric  history on file.     Family History  Problem Relation Age of Onset  . Heart disease Mother   . Heart disease Father   . Stroke Sister   . Diabetes type II Son   . Colon cancer Neg Hx     Social History   Tobacco Use  . Smoking status: Current Every Day Smoker    Packs/day: 0.25    Types: Cigarettes  . Smokeless tobacco: Never Used  . Tobacco comment: 6 cigs a day  Vaping Use  . Vaping Use: Some days  Substance Use Topics  . Alcohol use: No    Alcohol/week: 0.0 standard drinks  . Drug use: No    Home Medications Prior to Admission medications   Medication Sig Start Date End Date Taking? Authorizing Provider  acetaminophen (TYLENOL) 500 MG tablet Take 1,000 mg by mouth every 6 (six) hours as needed for moderate pain.    Yes [provider]  albuterol (PROVENTIL HFA;VENTOLIN HFA) 108 (90 Base) MCG/ACT inhaler Inhale 2 puffs into the lungs every 6 (six) hours as needed for wheezing or shortness of breath. 04/07/18  Yes Martyn Malay, MD  albuterol (PROVENTIL) (2.5 MG/3ML) 0.083% nebulizer solution Take 3 mLs (2.5 mg total) by nebulization every 6 (six) hours as needed for wheezing or shortness of breath. 05/13/19  Yes Martyn Malay, MD  allopurinol (ZYLOPRIM) 100 MG tablet Take 1 tablet (100 mg total) by mouth daily. 05/29/19  Yes Martyn Malay, MD  amLODipine (NORVASC) 10 MG tablet Take 1 tablet (10 mg total) by mouth daily. 05/29/19  Yes Martyn Malay, MD  cholecalciferol (VITAMIN D3) 25 MCG (1000 UNIT) tablet Take 1,000 Units by mouth daily.   Yes [provider]  diclofenac Sodium (VOLTAREN) 1 % GEL Use 4 times daily to affected area Patient taking differently: Apply 2 g topically 4 (four) times daily as needed (pain).  12/15/18  Yes Martyn Malay, MD  divalproex (DEPAKOTE ER) 250 MG 24 hr tablet Take 250 mg by mouth at bedtime.  02/11/18  Yes [provider]  ELIQUIS 5 MG TABS tablet TAKE ONE TABLET BY MOUTH TWICE A DAY 01/27/19  Yes Croitoru,  Mihai, MD  linagliptin (TRADJENTA) 5 MG TABS tablet Take 1 tablet (5 mg total) by mouth daily. 02/03/19  Yes Martyn Malay, MD  metoprolol tartrate (LOPRESSOR) 50 MG tablet Take 2 tablets (100 mg total) by mouth 2 (two) times daily. 05/29/19  Yes Martyn Malay, MD  mupirocin ointment (BACTROBAN) 2 % Apply 1 application topically 2 (two) times daily. To affected area 06/22/19  Yes Martyn Malay, MD  nitroGLYCERIN (NITROSTAT) 0.4 MG SL tablet PLACE 1 TABLET (0.4 MG TOTAL) UNDER THE TONGUE EVERY 5 MINUTES FOR THREE DOSES AS NEEDED. FOR CHEST PAIN CALL 911 AFTER THAT Patient taking differently: Place 0.4 mg under the tongue every 5 (five) minutes as needed for chest pain.  11/11/18  Yes Croitoru,  Mihai, MD  nystatin cream (MYCOSTATIN) Apply 1 application topically 2 (two) times daily as needed for dry skin. Use in skin folds for rash 11/12/18  Yes Martyn Malay, MD  OLANZapine (ZYPREXA) 10 MG tablet Take 10 mg by mouth at bedtime. 06/08/14  Yes [provider]  pravastatin (PRAVACHOL) 40 MG tablet TAKE ONE TABLET BY MOUTH EVERY EVENING 10/21/18  Yes Croitoru, Mihai, MD  QUEtiapine (SEROQUEL) 50 MG tablet Take 1 tablet (50 mg total) by mouth at bedtime. 02/17/18  Yes Martyn Malay, MD  senna (SENOKOT) 8.6 MG TABS tablet Take 1 tablet (8.6 mg total) by mouth daily as needed for mild constipation. 05/30/18  Yes Martyn Malay, MD  sodium bicarbonate 650 MG tablet Take 1 tablet (650 mg total) by mouth 2 (two) times daily. 06/23/18  Yes Martyn Malay, MD  umeclidinium-vilanterol [facility] ELLIPTA) 62.5-25 MCG/INH AEPB Inhale 1 puff into the lungs daily. 05/13/19  Yes Martyn Malay, MD  Blood Glucose Monitoring Suppl (ONETOUCH VERIO) w/Device KIT 1 Device by Does not apply route 3 (three) times a week. 02/17/18   Martyn Malay, MD  cephALEXin (KEFLEX) 500 MG capsule Take 1 capsule (500 mg total) by mouth 2 (two) times daily. Patient not taking: Reported on 04/20/2019 02/24/19   Martyn Malay, MD    doxycycline (VIBRAMYCIN) 100 MG capsule Take 1 capsule (100 mg total) by mouth 2 (two) times daily for 7 days. 06/26/19 07/03/19  Lorin Glass, PA-C  glucose blood test strip Test each morning before breakfast 04/03/19   Martyn Malay, MD  Incontinence Supplies MISC 1 Units by Does not apply route as needed. 12/13/15   McKeag, Marylynn Pearson, MD  Lancet Device MISC 1 Device by Does not apply route 3 (three) times a week. 02/17/18   Martyn Malay, MD  OneTouch Delica Lancets 44R MISC Use to test once daily.  DX code:E11.9 04/04/18   Martyn Malay, MD    Allergies    Abilify [aripiprazole], Clozapine, Benadryl [diphenhydramine hcl], Cogentin [benztropine], Haloperidol lactate, Latuda [lurasidone hcl], Remeron [mirtazapine], Codeine, and Latex  Review of Systems   Review of Systems  Constitutional: Negative for chills and fever.  HENT: Negative for congestion.   Eyes: Negative for visual disturbance.  Respiratory: Positive for cough and shortness of breath.   Gastrointestinal: Positive for abdominal pain. Negative for diarrhea, nausea and vomiting.  Genitourinary: Negative for dysuria.  Musculoskeletal: Negative for back pain and neck pain.  Skin: Positive for wound.  Neurological: Positive for speech difficulty.  All other systems reviewed and are negative.   Physical Exam Updated Vital Signs BP (!) 145/61   Pulse 81   Temp 99 F (37.2 C)   Resp 12   SpO2 95%   Physical Exam Vitals and nursing note reviewed.  Constitutional:      General: She is not in acute distress.    Appearance: She is well-developed. She is obese.  HENT:     Head: Normocephalic and atraumatic.     Mouth/Throat:     Mouth: Mucous membranes are moist.  Eyes:     Conjunctiva/sclera: Conjunctivae normal.  Cardiovascular:     Rate and Rhythm: Normal rate and regular rhythm.     Heart sounds: No murmur heard.   Pulmonary:     Effort: Pulmonary effort is normal. No respiratory distress.     Breath  sounds: Normal breath sounds.  Abdominal:     Palpations: Abdomen is soft.  Tenderness: There is abdominal tenderness (Very large hernia is ttp. ).     Hernia: A hernia is present. Hernia is present in the ventral area.  Musculoskeletal:     Cervical back: Neck supple.  Skin:    General: Skin is warm and dry.     Comments: Please see clinical image.  There is a 1cm ulcer with skin stretching across the anterior abdomen over the hernia.   Neurological:     Mental Status: She is alert.     Comments: Patient is awake and alert, she is oriented to person, place, and time.  When she gets distracted she is able to speak without slurred speech or hesitation.  No pronator drift, facial movements are symmetrical.  She has smacking movements with her mouth, consistent with her previously reported tardive dyskinesia.  She has 5/5 grip strength bilaterally.    Psychiatric:     Comments: Patient is anxious.  She tells me she is anxious about everything and worries about her family and the president.           ED Results / Procedures / Treatments   Labs (all labs ordered are listed, but only abnormal results are displayed) Labs Reviewed  COMPREHENSIVE METABOLIC PANEL - Abnormal; Notable for the following components:      Result Value   CO2 18 (*)    Glucose, Bld 159 (*)    BUN 28 (*)    Creatinine, Ser 1.60 (*)    GFR calc non Af Amer 31 (*)    GFR calc Af Amer 36 (*)    All other components within normal limits  CBC WITH DIFFERENTIAL/PLATELET - Abnormal; Notable for the following components:   WBC 13.8 (*)    Neutro Abs 8.2 (*)    Lymphs Abs 4.1 (*)    Abs Immature Granulocytes 0.14 (*)    All other components within normal limits  LIPASE, BLOOD - Abnormal; Notable for the following components:   Lipase 60 (*)    All other components within normal limits  BRAIN NATRIURETIC PEPTIDE - Abnormal; Notable for the following components:   B Natriuretic Peptide 147.6 (*)    All other  components within normal limits  SARS CORONAVIRUS 2 BY RT PCR (HOSPITAL ORDER, [facility])  ETHANOL  URINALYSIS, ROUTINE W REFLEX MICROSCOPIC  RAPID URINE DRUG SCREEN, HOSP PERFORMED  TROPONIN I (HIGH SENSITIVITY)  TROPONIN I (HIGH SENSITIVITY)    EKG EKG Interpretation  Date/Time:  Thursday June 25 2019 17:39:09 EDT Ventricular Rate:  72 PR Interval:    QRS Duration: 180 QT Interval:  472 QTC Calculation: 517 R Axis:   -113 Text Interpretation: Electronic ventricular pacemaker Confirmed by Lajean Saver [phone]) on 06/25/2019 5:41:17 PM   Radiology CT Chest W Contrast  Result Date: 06/25/2019 CLINICAL DATA:  Aphasia, shortness of breath and abdominal pain. EXAM: CT CHEST, ABDOMEN, AND PELVIS WITH CONTRAST TECHNIQUE: Multidetector CT imaging of the chest, abdomen and pelvis was performed following the standard protocol during bolus administration of intravenous contrast. CONTRAST:  31m OMNIPAQUE IOHEXOL 300 MG/ML  SOLN COMPARISON:  September 21, 2009 FINDINGS: CT CHEST FINDINGS Cardiovascular: There is moderate to marked severity calcification of the aortic arch. A multi lead AICD is in place. Normal heart size. No pericardial effusion. Marked severity coronary artery calcification is seen. Mediastinum/Nodes: No enlarged mediastinal, hilar, or axillary lymph nodes. Thyroid gland, trachea, and esophagus demonstrate no significant findings. Lungs/Pleura: Small subpleural cysts are seen involving the bilateral  apices. Mild to moderate severity linear atelectasis and/or infiltrate is seen within the left lower lobe. There is no evidence of a pleural effusion or pneumothorax. Musculoskeletal: Multilevel degenerative changes seen throughout the thoracic spine. CT ABDOMEN PELVIS FINDINGS Hepatobiliary: No focal liver abnormality is seen. Status post cholecystectomy. No biliary dilatation. Pancreas: Unremarkable. No pancreatic ductal dilatation or surrounding inflammatory  changes. Spleen: Normal in size without focal abnormality. Adrenals/Urinary Tract: The right adrenal gland is normal in appearance. 8 mm and 9 mm low-attenuation left adrenal masses are seen. Kidneys are normal in size, without renal calculi or hydronephrosis. Numerous subcentimeter bilateral renal cysts are seen. Bladder is unremarkable. Stomach/Bowel: Stomach is within normal limits. The appendix is not identified. No evidence of bowel wall thickening, distention, or inflammatory changes. Vascular/Lymphatic: There is marked severity calcification of the abdominal aorta. 3.2 cm x 3.0 cm aneurysmal dilatation of the infrarenal abdominal aorta is noted. Reproductive: Uterus and bilateral adnexa are unremarkable. Other: There is a 16.3 cm x 9.4 cm ventral hernia which contains fat and a segment of nondilated transverse colon. Musculoskeletal: Multilevel degenerative changes seen throughout the lumbar spine. IMPRESSION: 1. Mild to moderate severity linear atelectasis and/or infiltrate within the left lower lobe. Follow-up to resolution is recommended to exclude the presence of an underlying neoplastic process. 2. Marked severity coronary artery calcification. 3. 3.2 cm x 3.0 cm infrarenal abdominal aortic aneurysm. 4. Numerous subcentimeter bilateral renal cysts. 5. 16.3 cm x 9.4 cm ventral hernia which contains fat and a segment of nondilated transverse colon. 6. Small low-attenuation left adrenal masses which may represent adrenal adenomas. 7. Aortic atherosclerosis. Aortic Atherosclerosis (ICD10-I70.0). Electronically Signed   By: Virgina Norfolk M.D.   On: 06/25/2019 23:20   CT Abdomen Pelvis W Contrast  Result Date: 06/25/2019 CLINICAL DATA:  Aphasia, shortness of breath and abdominal pain. EXAM: CT CHEST, ABDOMEN, AND PELVIS WITH CONTRAST TECHNIQUE: Multidetector CT imaging of the chest, abdomen and pelvis was performed following the standard protocol during bolus administration of intravenous contrast.  CONTRAST:  72m OMNIPAQUE IOHEXOL 300 MG/ML  SOLN COMPARISON:  None. FINDINGS: CT CHEST FINDINGS Cardiovascular: There is moderate to marked severity calcification of the aortic arch. A multi lead AICD is in place. Normal heart size. No pericardial effusion. Marked severity coronary artery calcification is seen. Mediastinum/Nodes: No enlarged mediastinal, hilar, or axillary lymph nodes. Thyroid gland, trachea, and esophagus demonstrate no significant findings. Lungs/Pleura: Small subpleural cysts are seen involving the bilateral apices. Mild to moderate severity linear atelectasis and/or infiltrate is seen within the left lower lobe. There is no evidence of a pleural effusion or pneumothorax. Musculoskeletal: Multilevel degenerative changes seen throughout the thoracic spine. CT ABDOMEN PELVIS FINDINGS Hepatobiliary: No focal liver abnormality is seen. Status post cholecystectomy. No biliary dilatation. Pancreas: Unremarkable. No pancreatic ductal dilatation or surrounding inflammatory changes. Spleen: Normal in size without focal abnormality. Adrenals/Urinary Tract: The right adrenal gland is normal in appearance. 8 mm and 9 mm low-attenuation left adrenal masses are seen. Kidneys are normal in size, without renal calculi or hydronephrosis. Numerous subcentimeter bilateral renal cysts are seen. Bladder is unremarkable. Stomach/Bowel: Stomach is within normal limits. The appendix is not identified. No evidence of bowel wall thickening, distention, or inflammatory changes. Vascular/Lymphatic: There is marked severity calcification of the abdominal aorta. 3.2 cm x 3.0 cm aneurysmal dilatation of the infrarenal abdominal aorta is noted. Reproductive: Uterus and bilateral adnexa are unremarkable. Other: There is a 16.3 cm x 9.4 cm ventral hernia which contains fat and a segment of  nondilated transverse colon. Musculoskeletal: Multilevel degenerative changes seen throughout the lumbar spine. IMPRESSION: 1. Mild to  moderate severity linear atelectasis and/or infiltrate within the left lower lobe. Follow-up to resolution is recommended to exclude the presence of an underlying neoplastic process. 2. Marked severity coronary artery calcification. 3. 3.2 cm x 3.0 cm infrarenal abdominal aortic aneurysm. 4. Numerous subcentimeter bilateral renal cysts. 5. 16.3 cm x 9.4 cm ventral hernia which contains fat and a segment of nondilated transverse colon. 6. Small low-attenuation left adrenal masses which may represent adrenal adenomas. 7. Aortic atherosclerosis. Aortic Atherosclerosis (ICD10-I70.0). Electronically Signed   By: Virgina Norfolk M.D.   On: 06/25/2019 23:21   DG Chest Portable 1 View  Result Date: 06/25/2019 CLINICAL DATA:  Shortness of breath EXAM: PORTABLE CHEST 1 VIEW COMPARISON:  02/13/2018 FINDINGS: Stable left-sided implanted cardiac device. Mild cardiomegaly, unchanged. Atherosclerotic calcification of the aortic knob. Mildly prominent perihilar and bibasilar interstitial markings. No large pleural fluid collection. No pneumothorax. IMPRESSION: Mildly prominent perihilar and bibasilar interstitial markings, which may reflect mild edema versus atypical/viral infection. Electronically Signed   By: Davina Poke D.O.   On: 06/25/2019 19:19     Procedures Procedures (including critical care time)  Medications Ordered in ED Medications  sodium chloride 0.9 % bolus 250 mL (250 mLs Intravenous Bolus from Bag 06/25/19 2308)  iohexol (OMNIPAQUE) 300 MG/ML solution 80 mL (80 mLs Intravenous Contrast Given 06/25/19 2257)    ED Course  I have reviewed the triage vital signs and the nursing notes.  Pertinent labs & imaging results that were available during my care of the patient were reviewed by me and considered in my medical decision making (see chart for details).  Clinical Course as of Jun 26 35  Fri Jun 26, 2019  0013 I spoke with patient and her son after I spoke with Dr. Grandville Silos.  Patient  and her son are adamant that they do not want admission.  Explained to them that admission would be offered for further evaluation as I suspect it is very likely she will have a fistula develop from the hernia and ulceration.  They state the understanding of this and that outpatient follow up may delay repair and state their understanding, are adamant that they don't want admission.    [EH]    Clinical Course User Index [EH] Ollen Gross   MDM Rules/Calculators/A&P                         Patient is a 74 year old woman who presents today for evaluation of multiple complaints.   1: Speech change: Patient and her son report that her speech is slightly different than usual, the last time this happened was a week ago, normally will last for 3 to 5 hours and happens when patient gets upset.  When I speak with patient she appears to hesitate and have slightly slurred or difficult to understand speech, however when she gets distracted talking about either her family or answering simple direct questions this goes away.  She is at her neurologic baseline.  Given that this is not a new thing and happens frequently, and patient's son stating that this happens when she gets anxious, I spoke with Dr. Ashok Cordia, no indication for further evaluation at this time.  2: Cough and shortness of breath: Chest x-ray has mildly prominent perihilar and bibasilar atelectasis.  Opponent x2 was normal.  BNP is minimally elevated at 147.  She is given very  gentle hydration in the setting of CHF. As she did get contrast.  CT chest shows multiple incidental finding, including concern for a left-sided consolidation versus atelectasis.  Patient informed of this finding and the recommendation to follow-up to resolution.  They were also informed of incidental findings including calcifications and others. Plan to trial treatment with doxycycline, suspect COPD exacerbation.  Will not treat with steroids given the skin breakdown  and need to avoid impairment of healing at this time.   3: abdominal pain: Exam shows she has a superficial ulcer present over the.  CT abdomen pelvis was obtained showing a large hernia with out obstruction.  Given the superficial location of the hernia sac and her ulcer I spoke with Dr. Grandville Silos of general surgery.  He had recommended admission by medicine with surgical eval in the morning.  Patient and her son are both adamant that they do not wish to be admitted.  I had extensive discussion with both patient and her son regarding options for admission versus close outpatient follow-up, and possibility that if repair is offered based on the wound and size of the hernia that I suspect is contributing to the skin breakdown that any delay in repair, if offered, could cause worsening of condition including fistula formation infection, sepsis, chronic pain, bowel obstruction, disability that may may be severe, or death.  They state their understanding, and still wish for discharge home with close outpatient follow up.    At this time UA is pending.  Covid test is sent.    At shift change care was transferred to [facility] who will follow pending studies, re-evaulate and determine disposition.     Final Clinical Impression(s) / ED Diagnoses Final diagnoses:  Hernia of anterior abdominal wall  Generalized abdominal pain  Cough    Rx / DC Orders ED Discharge Orders         Ordered    doxycycline (VIBRAMYCIN) 100 MG capsule  2 times daily     Discontinue  Reprint     06/26/19 0034           Lorin Glass, PA-C 06/26/19 0037    Lajean Saver, MD 06/26/19 1627

## 2019-06-25 NOTE — Telephone Encounter (Signed)
Patient calls nurse line returning PCP call. Patient informed ok to take Bactroban. While on the phone patient reports drooling x1 week. Patient reports that she believes its medication related, patient stated her BP medication. Patient to me seems confused and has slurred speech. However, I do not know her baseline. Patient states she does not "think" she has facial droopiness, however her face feels "numb." Will forward to PCP.

## 2019-06-25 NOTE — Telephone Encounter (Signed)
[name] LVM on nurse line stating he did call EMS and they are enroute to patient. [name] stated he would mychart PCP with updates.

## 2019-06-25 NOTE — ED Triage Notes (Signed)
BIB [facility], always aphasic somewhat due to psych meds, worse today. No other new neurological deficits, limited motion in her feet due to having polio as a child. Also c/o abdominal pain and trouble breathing for three weeks and cough.

## 2019-06-25 NOTE — Telephone Encounter (Signed)
Called [name] (son). He reports he noted speech normal earlier today, now acting odd again this PM. Recommended calling EMS. [name] to do so. He will call back with updates.  [name], MD  Family Medicine Teaching Service

## 2019-06-26 ENCOUNTER — Encounter: Payer: Self-pay | Admitting: Family Medicine

## 2019-06-26 DIAGNOSIS — K439 Ventral hernia without obstruction or gangrene: Secondary | ICD-10-CM | POA: Diagnosis not present

## 2019-06-26 LAB — URINALYSIS, ROUTINE W REFLEX MICROSCOPIC
Bacteria, UA: NONE SEEN
Bilirubin Urine: NEGATIVE
Glucose, UA: 150 mg/dL — AB
Hgb urine dipstick: NEGATIVE
Ketones, ur: NEGATIVE mg/dL
Leukocytes,Ua: NEGATIVE
Nitrite: NEGATIVE
Protein, ur: 100 mg/dL — AB
Specific Gravity, Urine: 1.014 (ref 1.005–1.030)
pH: 7 (ref 5.0–8.0)

## 2019-06-26 LAB — RAPID URINE DRUG SCREEN, HOSP PERFORMED
Amphetamines: NOT DETECTED
Barbiturates: NOT DETECTED
Benzodiazepines: NOT DETECTED
Cocaine: NOT DETECTED
Opiates: NOT DETECTED
Tetrahydrocannabinol: NOT DETECTED

## 2019-06-26 LAB — SARS CORONAVIRUS 2 BY RT PCR (HOSPITAL ORDER, PERFORMED IN ~~LOC~~ HOSPITAL LAB): SARS Coronavirus 2: NEGATIVE

## 2019-06-26 MED ORDER — DOXYCYCLINE HYCLATE 100 MG PO CAPS
100.0000 mg | ORAL_CAPSULE | Freq: Two times a day (BID) | ORAL | 0 refills | Status: AC
Start: 2019-06-26 — End: 2019-07-03

## 2019-06-26 NOTE — Discharge Instructions (Addendum)
Please keep your wound clean and dry.   Please follow up with your doctor and the general surgery team.  Your CT scan had multiple abnormalities as we discussed.  Please follow-up with your primary care doctor regarding these abnormalities.  Today you were offered admission for further evaluation of your abdominal pain, hernia, and ulcer which you refused.  Please call the surgical office first thing in the morning to set up a follow-up appointment.  We discussed risks of delaying possible repair, if surgery offers one and you elected for discharge home so please set up close follow up.

## 2019-06-26 NOTE — ED Provider Notes (Signed)
Abigail Wallace is a 74 y.o. female, presenting to the ED with worsening shortness of breath as well as some abdominal concern.  HPI from Wyn Quaker, PA-C: "Vernis Cabacungan is a 74 y.o. female with a past medical history of COPD, CAD, tardive dyskinesia, DM 2, memory difficulties, pacemaker in place who presents today with her son for evaluation of multiple complaints. Patient's son reports that when last spoke with patient at 3:30 PM today her speech was normal, and when he spoke with her after a "for 30 her speech was different.  He states that it will change when she gets really anxious or worked up about something, the last time it changed like this was about a week ago.  He states that it will last for 3 to 4 hours and then go away.  That itself is not changed or different.  He states that she is very anxious.  She feels like she has to pause before she answers something.  Patient's son states that again that is something that intermittently happens stating that "it comes and goes."  She reports worsening cough and shortness of breath.  Both patient and her son are unable to tell me when this episode started, simply stating that it comes and goes.  Patient reports increased cough.  Reports compliance with medications.  Triage note reports that they had stated trouble breathing and abdominal pain worsening weeks.  Patient has baseline weakness in her legs from having polio as a child.   Patient has had a wound over her abdomen worsening over about the past week.  She has additionally had abdominal pain."  Past Medical History:  Diagnosis Date  . Abdominal wall hernia 01/2017  . Atrial fibrillation ([place])   . CKD (chronic kidney disease)   . COPD (chronic obstructive pulmonary disease) ([place])   . Coronary artery disease    NON-CRITICAL  . Ectasis aorta ([place])   . GERD (gastroesophageal reflux disease)   . Gout   . Hypertension   . Memory difficulties 12/02/2013  . Psychosis  ([place])   . Tardive dyskinesia 12/02/2013  . Type 2 diabetes mellitus ([facility])       Physical Exam  BP (!) 134/109   Pulse 74   Temp 99 F (37.2 C)   Resp 17   SpO2 96%   Physical Exam Vitals and nursing note reviewed.  Constitutional:      General: She is not in acute distress.    Appearance: She is well-developed. She is obese. She is not diaphoretic.  HENT:     Head: Normocephalic and atraumatic.     Mouth/Throat:     Mouth: Mucous membranes are moist.     Pharynx: Oropharynx is clear.  Eyes:     Conjunctiva/sclera: Conjunctivae normal.  Cardiovascular:     Rate and Rhythm: Normal rate and regular rhythm.     Pulses: Normal pulses.          Radial pulses are 2+ on the right side and 2+ on the left side.       Posterior tibial pulses are 2+ on the right side and 2+ on the left side.     Heart sounds: Normal heart sounds.     Comments: Tactile temperature in the extremities appropriate and equal bilaterally. Pulmonary:     Effort: Pulmonary effort is normal. No respiratory distress.     Breath sounds: Normal breath sounds.     Comments: Patient lying in the right lateral recumbent position.  Able to speak full sentences with some need for rest in between. Abdominal:     Palpations: Abdomen is soft.     Tenderness: There is no abdominal tenderness. There is no guarding.  Musculoskeletal:     Cervical back: Neck supple.     Right lower leg: No edema.     Left lower leg: No edema.  Lymphadenopathy:     Cervical: No cervical adenopathy.  Skin:    General: Skin is warm and dry.  Neurological:     Mental Status: She is alert.  Psychiatric:        Mood and Affect: Mood and affect normal.        Speech: Speech normal.        Behavior: Behavior normal.     ED Course/Procedures     Procedures   Abnormal Labs Reviewed  COMPREHENSIVE METABOLIC PANEL - Abnormal; Notable for the following components:      Result Value   CO2 18 (*)    Glucose, Bld 159 (*)    BUN 28  (*)    Creatinine, Ser 1.60 (*)    GFR calc non Af Amer 31 (*)    GFR calc Af Amer 36 (*)    All other components within normal limits  CBC WITH DIFFERENTIAL/PLATELET - Abnormal; Notable for the following components:   WBC 13.8 (*)    Neutro Abs 8.2 (*)    Lymphs Abs 4.1 (*)    Abs Immature Granulocytes 0.14 (*)    All other components within normal limits  URINALYSIS, ROUTINE W REFLEX MICROSCOPIC - Abnormal; Notable for the following components:   Color, Urine STRAW (*)    Glucose, UA 150 (*)    Protein, ur 100 (*)    All other components within normal limits  LIPASE, BLOOD - Abnormal; Notable for the following components:   Lipase 60 (*)    All other components within normal limits  BRAIN NATRIURETIC PEPTIDE - Abnormal; Notable for the following components:   B Natriuretic Peptide 147.6 (*)    All other components within normal limits    No results found.  CT Chest W Contrast  Result Date: 06/25/2019 CLINICAL DATA:  Aphasia, shortness of breath and abdominal pain. EXAM: CT CHEST, ABDOMEN, AND PELVIS WITH CONTRAST TECHNIQUE: Multidetector CT imaging of the chest, abdomen and pelvis was performed following the standard protocol during bolus administration of intravenous contrast. CONTRAST:  58mL OMNIPAQUE IOHEXOL 300 MG/ML  SOLN COMPARISON:  September 21, 2009 FINDINGS: CT CHEST FINDINGS Cardiovascular: There is moderate to marked severity calcification of the aortic arch. A multi lead AICD is in place. Normal heart size. No pericardial effusion. Marked severity coronary artery calcification is seen. Mediastinum/Nodes: No enlarged mediastinal, hilar, or axillary lymph nodes. Thyroid gland, trachea, and esophagus demonstrate no significant findings. Lungs/Pleura: Small subpleural cysts are seen involving the bilateral apices. Mild to moderate severity linear atelectasis and/or infiltrate is seen within the left lower lobe. There is no evidence of a pleural effusion or pneumothorax.  Musculoskeletal: Multilevel degenerative changes seen throughout the thoracic spine. CT ABDOMEN PELVIS FINDINGS Hepatobiliary: No focal liver abnormality is seen. Status post cholecystectomy. No biliary dilatation. Pancreas: Unremarkable. No pancreatic ductal dilatation or surrounding inflammatory changes. Spleen: Normal in size without focal abnormality. Adrenals/Urinary Tract: The right adrenal gland is normal in appearance. 8 mm and 9 mm low-attenuation left adrenal masses are seen. Kidneys are normal in size, without renal calculi or hydronephrosis. Numerous subcentimeter bilateral renal cysts are seen. Bladder  is unremarkable. Stomach/Bowel: Stomach is within normal limits. The appendix is not identified. No evidence of bowel wall thickening, distention, or inflammatory changes. Vascular/Lymphatic: There is marked severity calcification of the abdominal aorta. 3.2 cm x 3.0 cm aneurysmal dilatation of the infrarenal abdominal aorta is noted. Reproductive: Uterus and bilateral adnexa are unremarkable. Other: There is a 16.3 cm x 9.4 cm ventral hernia which contains fat and a segment of nondilated transverse colon. Musculoskeletal: Multilevel degenerative changes seen throughout the lumbar spine. IMPRESSION: 1. Mild to moderate severity linear atelectasis and/or infiltrate within the left lower lobe. Follow-up to resolution is recommended to exclude the presence of an underlying neoplastic process. 2. Marked severity coronary artery calcification. 3. 3.2 cm x 3.0 cm infrarenal abdominal aortic aneurysm. 4. Numerous subcentimeter bilateral renal cysts. 5. 16.3 cm x 9.4 cm ventral hernia which contains fat and a segment of nondilated transverse colon. 6. Small low-attenuation left adrenal masses which may represent adrenal adenomas. 7. Aortic atherosclerosis. Aortic Atherosclerosis (ICD10-I70.0). Electronically Signed   By: Virgina Norfolk M.D.   On: 06/25/2019 23:20   CT Abdomen Pelvis W Contrast  Result  Date: 06/25/2019 CLINICAL DATA:  Aphasia, shortness of breath and abdominal pain. EXAM: CT CHEST, ABDOMEN, AND PELVIS WITH CONTRAST TECHNIQUE: Multidetector CT imaging of the chest, abdomen and pelvis was performed following the standard protocol during bolus administration of intravenous contrast. CONTRAST:  56mL OMNIPAQUE IOHEXOL 300 MG/ML  SOLN COMPARISON:  None. FINDINGS: CT CHEST FINDINGS Cardiovascular: There is moderate to marked severity calcification of the aortic arch. A multi lead AICD is in place. Normal heart size. No pericardial effusion. Marked severity coronary artery calcification is seen. Mediastinum/Nodes: No enlarged mediastinal, hilar, or axillary lymph nodes. Thyroid gland, trachea, and esophagus demonstrate no significant findings. Lungs/Pleura: Small subpleural cysts are seen involving the bilateral apices. Mild to moderate severity linear atelectasis and/or infiltrate is seen within the left lower lobe. There is no evidence of a pleural effusion or pneumothorax. Musculoskeletal: Multilevel degenerative changes seen throughout the thoracic spine. CT ABDOMEN PELVIS FINDINGS Hepatobiliary: No focal liver abnormality is seen. Status post cholecystectomy. No biliary dilatation. Pancreas: Unremarkable. No pancreatic ductal dilatation or surrounding inflammatory changes. Spleen: Normal in size without focal abnormality. Adrenals/Urinary Tract: The right adrenal gland is normal in appearance. 8 mm and 9 mm low-attenuation left adrenal masses are seen. Kidneys are normal in size, without renal calculi or hydronephrosis. Numerous subcentimeter bilateral renal cysts are seen. Bladder is unremarkable. Stomach/Bowel: Stomach is within normal limits. The appendix is not identified. No evidence of bowel wall thickening, distention, or inflammatory changes. Vascular/Lymphatic: There is marked severity calcification of the abdominal aorta. 3.2 cm x 3.0 cm aneurysmal dilatation of the infrarenal abdominal  aorta is noted. Reproductive: Uterus and bilateral adnexa are unremarkable. Other: There is a 16.3 cm x 9.4 cm ventral hernia which contains fat and a segment of nondilated transverse colon. Musculoskeletal: Multilevel degenerative changes seen throughout the lumbar spine. IMPRESSION: 1. Mild to moderate severity linear atelectasis and/or infiltrate within the left lower lobe. Follow-up to resolution is recommended to exclude the presence of an underlying neoplastic process. 2. Marked severity coronary artery calcification. 3. 3.2 cm x 3.0 cm infrarenal abdominal aortic aneurysm. 4. Numerous subcentimeter bilateral renal cysts. 5. 16.3 cm x 9.4 cm ventral hernia which contains fat and a segment of nondilated transverse colon. 6. Small low-attenuation left adrenal masses which may represent adrenal adenomas. 7. Aortic atherosclerosis. Aortic Atherosclerosis (ICD10-I70.0). Electronically Signed   By: Joyce Gross.D.  On: 06/25/2019 23:21   DG Chest Portable 1 View  Result Date: 06/25/2019 CLINICAL DATA:  Shortness of breath EXAM: PORTABLE CHEST 1 VIEW COMPARISON:  02/13/2018 FINDINGS: Stable left-sided implanted cardiac device. Mild cardiomegaly, unchanged. Atherosclerotic calcification of the aortic knob. Mildly prominent perihilar and bibasilar interstitial markings. No large pleural fluid collection. No pneumothorax. IMPRESSION: Mildly prominent perihilar and bibasilar interstitial markings, which may reflect mild edema versus atypical/viral infection. Electronically Signed   By: Davina Poke D.O.   On: 06/25/2019 19:19      MDM    Clinical Course as of Jun 26 2244  Fri Jun 26, 2019  0013 I spoke with patient and her son after I spoke with Dr. Grandville Silos.  Patient and her son are adamant that they do not want admission.  Explained to them that admission would be offered for further evaluation as I suspect it is very likely she will have a fistula develop from the hernia and ulceration.  They  state the understanding of this and that outpatient follow up may delay repair and state their understanding, are adamant that they don't want admission.    [EH]    Clinical Course User Index [EH] Lorin Glass, PA-C    Patient care handoff report received from Wyn Quaker, PA-C. Plan: Review of UA.  Reassess patient and rediscuss goals of care.  UA without evidence of infection.  Chest x-ray possibly with developing infection.  Low suspicion for sepsis at this time.  Patient started on doxycycline. I again discussed the recommendation for admission and the reasoning for this regarding the patient's large hernia.  Patient and her son both reiterate their desire to go home and decline admission. Referral information given for general surgery office follow-up. Strict return precautions discussed.  Patient voices understanding of these instructions, accepts the plan, and is comfortable with discharge.  I reviewed and interpreted the patient's labs and radiological studies.  Vitals:   06/25/19 2039 06/25/19 2040 06/25/19 2221 06/25/19 2225  BP:   (!) 113/94 (!) 134/109  Pulse: 70 70 71 74  Resp: 15 12  17   Temp:      SpO2: 95% 96% 96% 96%       Layla Maw 06/27/19 2250    Palumbo, April, MD 06/28/19 0021

## 2019-06-29 ENCOUNTER — Telehealth: Payer: Self-pay | Admitting: Family Medicine

## 2019-06-29 NOTE — Telephone Encounter (Signed)
Called per patient and MyChart request---left generic voicemail on son's phone #. If calls back please identify convenient times for family to discuss ED visit.  [name], MD  Family Medicine Teaching Service

## 2019-06-30 ENCOUNTER — Telehealth: Payer: Self-pay | Admitting: Family Medicine

## 2019-06-30 DIAGNOSIS — I5032 Chronic diastolic (congestive) heart failure: Secondary | ICD-10-CM

## 2019-06-30 DIAGNOSIS — M1711 Unilateral primary osteoarthritis, right knee: Secondary | ICD-10-CM

## 2019-06-30 DIAGNOSIS — E1165 Type 2 diabetes mellitus with hyperglycemia: Secondary | ICD-10-CM

## 2019-06-30 MED ORDER — ONETOUCH VERIO VI STRP: ORAL_STRIP | 12 refills | Status: DC

## 2019-06-30 MED ORDER — ONETOUCH DELICA LANCETS 33G MISC: 3 refills | Status: DC

## 2019-06-30 MED ORDER — ONETOUCH VERIO W/DEVICE KIT: PACK | 3 refills | Status: DC

## 2019-06-30 NOTE — Telephone Encounter (Signed)
Called patient and son.  Scheduled appointment Dr. Ninfa Linden in July.  Reviewed return precautions for hernia.  CT scan of chest reviewed. Will need follow up in 6 weeks. Appointment scheduled.   Adrenal adenomas--- PRA plus overnight dexamethasone suppression test. 1 mg dexamethasone overnight with AM cortisol.   Sliding transfer seat still needed for shower.  Patient is doing well. Cough improving, no more slurred speech. Reviewed return precautions.  [name], MD  Family Medicine Teaching Service

## 2019-06-30 NOTE — Telephone Encounter (Signed)
Message sent to [employer] and was received.

## 2019-07-05 ENCOUNTER — Encounter: Payer: Self-pay | Admitting: Family Medicine

## 2019-07-05 DIAGNOSIS — E1165 Type 2 diabetes mellitus with hyperglycemia: Secondary | ICD-10-CM

## 2019-07-06 ENCOUNTER — Other Ambulatory Visit: Payer: Self-pay

## 2019-07-06 MED ORDER — ONETOUCH VERIO W/DEVICE KIT: PACK | 3 refills | Status: DC

## 2019-07-09 ENCOUNTER — Other Ambulatory Visit: Payer: Self-pay | Admitting: *Deleted

## 2019-07-09 DIAGNOSIS — M546 Pain in thoracic spine: Secondary | ICD-10-CM

## 2019-07-09 DIAGNOSIS — N1832 Chronic kidney disease, stage 3b: Secondary | ICD-10-CM

## 2019-07-09 MED ORDER — DICLOFENAC SODIUM 1 % EX GEL: 2.0000 g | Freq: Four times a day (QID) | CUTANEOUS | 1 refills | Status: DC | PRN

## 2019-07-09 MED ORDER — SODIUM BICARBONATE 650 MG PO TABS: 650.0000 mg | ORAL_TABLET | Freq: Two times a day (BID) | ORAL | 3 refills | Status: DC

## 2019-07-13 ENCOUNTER — Other Ambulatory Visit: Payer: Self-pay | Admitting: Cardiovascular Disease

## 2019-07-13 NOTE — Telephone Encounter (Signed)
Spoke with patient to verify her pharmacy. patient states the pharmacy never got the refill we sent on 05/29/19. I called the pharmacy they stated that they did get it I attempted to call pt to inform her it was there and had no luck but did leave a detailed vmail.

## 2019-07-13 NOTE — Telephone Encounter (Signed)
*  STAT* If patient is at the pharmacy, call can be transferred to refill team.   1. Which medications need to be refilled? (please list name of each medication and dose if known) Allopurinol  2. Which pharmacy/location (including street and city if local pharmacy) is medication to be sent to? [phone]  3. Do they need a 30 day or 90 day supply? 90 and refills

## 2019-07-14 ENCOUNTER — Other Ambulatory Visit: Payer: Self-pay

## 2019-07-14 DIAGNOSIS — L0100 Impetigo, unspecified: Secondary | ICD-10-CM

## 2019-07-14 DIAGNOSIS — L304 Erythema intertrigo: Secondary | ICD-10-CM

## 2019-07-14 MED ORDER — MUPIROCIN 2 % EX OINT: 1.0000 "application " | TOPICAL_OINTMENT | Freq: Two times a day (BID) | CUTANEOUS | 0 refills | Status: DC

## 2019-07-14 MED ORDER — NYSTATIN 100000 UNIT/GM EX CREA: 1.0000 "application " | TOPICAL_CREAM | Freq: Two times a day (BID) | CUTANEOUS | 3 refills | Status: DC | PRN

## 2019-07-14 NOTE — Addendum Note (Signed)
Addended by: [name], Dellas Guard on: 07/14/2019 04:56 PM   Modules accepted: Orders

## 2019-07-14 NOTE — Telephone Encounter (Signed)
Called patient to follow up on refill request. Mycostatin refilled, recommended follow up for abdominal examination given concerns from last ED visit. Appointment has been scheduled, reviewed return precautions, all questions answered.  [name], MD  Family Medicine Teaching Service

## 2019-07-15 ENCOUNTER — Other Ambulatory Visit: Payer: Self-pay

## 2019-07-15 ENCOUNTER — Encounter: Payer: Self-pay | Admitting: Family Medicine

## 2019-07-15 DIAGNOSIS — E1165 Type 2 diabetes mellitus with hyperglycemia: Secondary | ICD-10-CM

## 2019-07-15 MED ORDER — LANCET DEVICE MISC: 1.0000 | 11 refills | Status: DC

## 2019-07-15 MED ORDER — GLUCOSE BLOOD VI STRP: ORAL_STRIP | 12 refills | Status: DC

## 2019-07-17 ENCOUNTER — Telehealth: Payer: Self-pay

## 2019-07-17 NOTE — Telephone Encounter (Signed)
Abigail Wallace calls nurse line stating they received another wrong "sliding shower chair" from [employer]. Abigail Wallace expressed his frustrations with the company and no longer wants to use them. I advised Abigail Wallace he could come by our office to pick up the printed DME order and take to supply store. I feel like this may better serve them and want they are looking for. This should be covered by her insurance.

## 2019-07-27 ENCOUNTER — Telehealth: Payer: Self-pay | Admitting: Family Medicine

## 2019-07-27 NOTE — Telephone Encounter (Signed)
Patients son Jori Moll is dropping off Handicap Placard to be completed by Dr. [name]. Last date of service 05/29/19.  Patient's son would like this form to be faxed to the [facility].   I have placed the form in the red team folder.

## 2019-07-27 NOTE — Telephone Encounter (Signed)
Handicap placard form reviewed and placed in PCP's box for completion.  Abigail Wallace, [place]

## 2019-07-28 NOTE — Telephone Encounter (Signed)
Reviewed, completed, and signed form.  Note routed to RN team inbasket and placed completed form in Clinic RN's office (wall pocket above desk).  Tonio Seider M Raea Magallon, MD   

## 2019-07-29 ENCOUNTER — Encounter: Payer: Self-pay | Admitting: Family Medicine

## 2019-07-29 NOTE — Telephone Encounter (Signed)
Mailed form to [facility] No number for faxing was left by patient or on form.

## 2019-08-07 ENCOUNTER — Encounter: Payer: Self-pay | Admitting: Family Medicine

## 2019-08-07 ENCOUNTER — Ambulatory Visit (INDEPENDENT_AMBULATORY_CARE_PROVIDER_SITE_OTHER): Payer: Medicare Other | Admitting: Family Medicine

## 2019-08-07 ENCOUNTER — Ambulatory Visit
Admission: RE | Admit: 2019-08-07 | Discharge: 2019-08-07 | Disposition: A | Discharge status: Home / Self Care | Payer: Medicare Other | Source: Ambulatory Visit | Attending: Family Medicine | Admitting: Family Medicine

## 2019-08-07 ENCOUNTER — Other Ambulatory Visit: Payer: Self-pay

## 2019-08-07 VITALS — BP 140/80 | HR 75 | Wt 254.6 lb

## 2019-08-07 DIAGNOSIS — E119 Type 2 diabetes mellitus without complications: Secondary | ICD-10-CM

## 2019-08-07 DIAGNOSIS — Z720 Tobacco use: Secondary | ICD-10-CM

## 2019-08-07 DIAGNOSIS — N1832 Chronic kidney disease, stage 3b: Secondary | ICD-10-CM | POA: Diagnosis not present

## 2019-08-07 DIAGNOSIS — R059 Cough, unspecified: Secondary | ICD-10-CM

## 2019-08-07 DIAGNOSIS — R05 Cough: Secondary | ICD-10-CM

## 2019-08-07 DIAGNOSIS — R918 Other nonspecific abnormal finding of lung field: Secondary | ICD-10-CM | POA: Diagnosis not present

## 2019-08-07 DIAGNOSIS — F17200 Nicotine dependence, unspecified, uncomplicated: Secondary | ICD-10-CM

## 2019-08-07 DIAGNOSIS — R053 Chronic cough: Secondary | ICD-10-CM

## 2019-08-07 DIAGNOSIS — I10 Essential (primary) hypertension: Secondary | ICD-10-CM | POA: Diagnosis not present

## 2019-08-07 LAB — POCT GLYCOSYLATED HEMOGLOBIN (HGB A1C): HbA1c, POC (controlled diabetic range): 6.3 % (ref 0.0–7.0)

## 2019-08-07 NOTE — Assessment & Plan Note (Signed)
A1c today at goal.  Reviewed glucometer which demonstrates blood sugars in the 120s to 150s.  There is only one reading over 200.  Congratulated on changes.  Encouraged her to make lifestyle changes that she has had a slight amount of weight gain over the past several months.  All questions were answered

## 2019-08-07 NOTE — Patient Instructions (Addendum)
It was wonderful to see you today.  Please bring ALL of your medications with you to every visit.   Today we talked about:  - Continuing your diabetes medications   - You are doing great with your sweets  - Keep walking at least once per day  - Try to reduce cigarettes  - I will call you with CT results   Thank you for choosing [facility].   Please call [phone] with any questions about today's appointment.  Please be sure to schedule follow up at the front  desk before you leave today.   [name], MD  Family Medicine

## 2019-08-07 NOTE — Progress Notes (Signed)
SUBJECTIVE:   CHIEF COMPLAINT / HPI:   Abigail Wallace is a pleasant 74 year old woman with history significant for tobacco use, post polio syndrome with resulting weakness, morbid obesity, type 2 diabetes, atrial fibrillation on anticoagulation and essential hypertension presenting today for routine follow-up.  She is joined by her son Abigail Wallace today.  She reports overall she is doing well they are looking forward to an upcoming wedding.  The son of their niece is getting married which is very exciting for them.  There are several questions about the Covid vaccine the patient is not ready to get her for the vaccine at this time and still is thinking about this  The patient reports that in terms of her diabetes she is doing well.  Her diet has improved.  Glucose log was reviewed by Dr.Koval.  Blood glucose range 140s on the low 200s at the highest.  A1c today is similar to prior and less than 6.5.  Congratulated on this.  Patient continues to smoke 6 cigarettes/day.  She has not been interested in medication at this time.  The patient's main complaint today is "phlegm and cough".  She reports persistent coughing at times.  This is a productive cough no hemoptysis and her dyspnea is at baseline.  She has no chest pain.  No worsening lower extremity edema and no fevers.  She was recently treated with doxycycline by the ED (for COPD?).  She is due for repeat CT chest which was abnormal at the time of her emergency department evaluation and treatment for the infection and exacerbation of lung disease.  She is not interested in quitting smoking at this time.  PERTINENT  PMH / PSH/Family/Social History : Reviewed and updated as appropriate  OBJECTIVE:   BP (!) 140/80   Pulse 75   Wt (!) 254 lb 9.6 oz (115.5 kg)   SpO2 97%   BMI 41.09 kg/m   HEENT: Sclera anicteric. Dentition is moderate. Appears well hydrated. Neck: Supple Cardiac: Regular rate and rhythm. Normal S1/S2. No murmurs, rubs, or  gallops appreciated. Lungs: Good air entry bilaterally.  There is transmitted upper airway noises throughout.  No wheezing, crackles on right, no egophony  Abdomen: Small healing erythematous lesion on the anterior abdomen without drainage there is an area of central clearing.  At this time the lesion measures less than 0.5 cm Psych: Pleasant and appropriate     ASSESSMENT/PLAN:   Essential hypertension Blood pressure improved and at goal today.  She denies side effects.  Repeat creatinine today.  Will call with results.  No side effects from medication at this time.  Well controlled type 2 diabetes mellitus ([place]) A1c today at goal.  Reviewed glucometer which demonstrates blood sugars in the 120s to 150s.  There is only one reading over 200.  Congratulated on changes.  Encouraged her to make lifestyle changes that she has had a slight amount of weight gain over the past several months.  All questions were answered  Tobacco use disorder Encourage cessation today.  Pulmonary nodules Discussed recent abnormal CT.  A follow-up CT scan is scheduled.  See details about chronic cough and discussion related to repeat chest x-ray today as CT scan cannot schedule for several weeks.   Chronic cough the patient has had a productive cough for several years now.  Her breathing is at baseline.  She is certain she needs different antibiotics or another round of antibiotics.  We discussed at length.  I recommended obtaining imaging  at this time with CT (follow up from LLL infiltrate).  She would like imaging today as she feels she needs more ABX-discussed risks.  She has no other symptoms of pneumonia including fever.  Will obtain 2 view chest x-ray to determine if further antibiotic therapy is warranted as she does have some symptoms of pneumonia and was only treated with doxycycline which will only cover atypical organisms.  HCM Declined COVID vaccine  Eye exam at follow up    [name], [place]

## 2019-08-07 NOTE — Assessment & Plan Note (Signed)
Discussed recent abnormal CT.  A follow-up CT scan is scheduled.  See details about chronic cough and discussion related to repeat chest x-ray today as CT scan cannot schedule for several weeks.

## 2019-08-07 NOTE — Assessment & Plan Note (Signed)
Blood pressure improved and at goal today.  She denies side effects.  Repeat creatinine today.  Will call with results.  No side effects from medication at this time.

## 2019-08-07 NOTE — Assessment & Plan Note (Signed)
Encourage cessation today.

## 2019-08-08 LAB — BASIC METABOLIC PANEL
BUN/Creatinine Ratio: 16 (ref 12–28)
BUN: 22 mg/dL (ref 8–27)
CO2: 19 mmol/L — ABNORMAL LOW (ref 20–29)
Calcium: 9.7 mg/dL (ref 8.7–10.3)
Chloride: 104 mmol/L (ref 96–106)
Creatinine, Ser: 1.41 mg/dL — ABNORMAL HIGH (ref 0.57–1.00)
GFR calc Af Amer: 42 mL/min/{1.73_m2} — ABNORMAL LOW (ref >59)
GFR calc non Af Amer: 36 mL/min/{1.73_m2} — ABNORMAL LOW (ref >59)
Glucose: 118 mg/dL — ABNORMAL HIGH (ref 65–99)
Potassium: 4.2 mmol/L (ref 3.5–5.2)
Sodium: 136 mmol/L (ref 134–144)

## 2019-08-09 ENCOUNTER — Telehealth: Payer: Self-pay | Admitting: Family Medicine

## 2019-08-09 NOTE — Telephone Encounter (Addendum)
Called patient with results.   Chest x-ray normal, no indication for ABX. Repeat CT given prior findings.   [name], MD  Family Medicine Teaching Service

## 2019-08-10 ENCOUNTER — Telehealth: Payer: Self-pay | Admitting: Family Medicine

## 2019-08-10 DIAGNOSIS — J449 Chronic obstructive pulmonary disease, unspecified: Secondary | ICD-10-CM

## 2019-08-10 NOTE — Telephone Encounter (Signed)
Patients son Abigail Wallace dropped off forms for live in aide, caregiver. Last DOS was 08/07/2019. Patients contact number [phone]. When forms are completed please fax over ATTN: D[place]ie to fax number [phone]. Thanks

## 2019-08-11 MED ORDER — TRELEGY ELLIPTA 100-62.5-25 MCG/INH IN AEPB: INHALATION_SPRAY | RESPIRATORY_TRACT | 3 refills | Status: DC

## 2019-08-11 NOTE — Telephone Encounter (Signed)
Reviewed form and placed in PCP's box for completion.  .Garald Rhew R Andria Head, CMA  

## 2019-08-11 NOTE — Telephone Encounter (Signed)
Called son, discussed recent results.  Reviewed, completed, and signed form.  Note routed to RN team inbasket and placed completed form in Clinic RN's office (wall pocket above desk). Please fax to apartment complex.  Martyn Malay, MD

## 2019-08-12 NOTE — Telephone Encounter (Signed)
Faxed to provided number. Copy placed in batch scanning.   Talbot Grumbling, RN

## 2019-08-21 NOTE — Telephone Encounter (Signed)
Son calls back yesterday afternoon.  He called the [employer] and they never received the form.  He is requesting a new one to be faxed to his mothers house.  Dr. [name] completed, placed in to be mailed pile  Copy made for batch scanning. Christen Bame, CMA

## 2019-08-24 DIAGNOSIS — K439 Ventral hernia without obstruction or gangrene: Secondary | ICD-10-CM | POA: Diagnosis not present

## 2019-08-27 ENCOUNTER — Other Ambulatory Visit: Payer: Self-pay

## 2019-08-27 ENCOUNTER — Telehealth: Payer: Self-pay | Admitting: Family Medicine

## 2019-08-27 ENCOUNTER — Ambulatory Visit (HOSPITAL_COMMUNITY)
Admission: RE | Admit: 2019-08-27 | Discharge: 2019-08-27 | Disposition: A | Discharge status: Home / Self Care | Payer: Medicare Other | Source: Ambulatory Visit | Attending: Family Medicine | Admitting: Family Medicine

## 2019-08-27 DIAGNOSIS — R0602 Shortness of breath: Secondary | ICD-10-CM | POA: Diagnosis not present

## 2019-08-27 DIAGNOSIS — R059 Cough, unspecified: Secondary | ICD-10-CM

## 2019-08-27 DIAGNOSIS — R05 Cough: Secondary | ICD-10-CM | POA: Insufficient documentation

## 2019-08-27 DIAGNOSIS — R918 Other nonspecific abnormal finding of lung field: Secondary | ICD-10-CM

## 2019-08-27 DIAGNOSIS — Z720 Tobacco use: Secondary | ICD-10-CM | POA: Diagnosis not present

## 2019-08-27 DIAGNOSIS — R053 Chronic cough: Secondary | ICD-10-CM

## 2019-08-27 NOTE — Telephone Encounter (Signed)
Called patient and son about missed appointment--will reschedule for later in September.  [name], MD  Family Medicine Teaching Service

## 2019-08-28 ENCOUNTER — Ambulatory Visit: Payer: Medicare Other | Admitting: Family Medicine

## 2019-08-28 ENCOUNTER — Telehealth: Payer: Self-pay | Admitting: Family Medicine

## 2019-08-28 DIAGNOSIS — R918 Other nonspecific abnormal finding of lung field: Secondary | ICD-10-CM

## 2019-08-28 NOTE — Telephone Encounter (Signed)
Called patient and son with result.   Recommend smoking cessation. Reviewed lung nodules--repeat CT in 6-12 months (between February and July 2022). Placed on problem list, discussed with son and patient. All questions were answered.   [name], MD  Family Medicine Teaching Service

## 2019-09-07 ENCOUNTER — Encounter: Payer: Self-pay | Admitting: Family Medicine

## 2019-09-07 MED ORDER — METOPROLOL TARTRATE 50 MG PO TABS: 100.0000 mg | ORAL_TABLET | Freq: Two times a day (BID) | ORAL | 2 refills | Status: DC

## 2019-09-24 ENCOUNTER — Other Ambulatory Visit: Payer: Self-pay | Admitting: *Deleted

## 2019-09-24 DIAGNOSIS — J449 Chronic obstructive pulmonary disease, unspecified: Secondary | ICD-10-CM

## 2019-09-24 MED ORDER — TRELEGY ELLIPTA 100-62.5-25 MCG/INH IN AEPB: INHALATION_SPRAY | RESPIRATORY_TRACT | 3 refills | Status: DC

## 2019-09-25 ENCOUNTER — Other Ambulatory Visit: Payer: Self-pay

## 2019-09-25 ENCOUNTER — Ambulatory Visit (INDEPENDENT_AMBULATORY_CARE_PROVIDER_SITE_OTHER): Payer: Medicare Other | Admitting: Family Medicine

## 2019-09-25 ENCOUNTER — Encounter: Payer: Self-pay | Admitting: Family Medicine

## 2019-09-25 VITALS — BP 135/80 | HR 78 | Wt 255.2 lb

## 2019-09-25 DIAGNOSIS — I5032 Chronic diastolic (congestive) heart failure: Secondary | ICD-10-CM | POA: Diagnosis not present

## 2019-09-25 DIAGNOSIS — F200 Paranoid schizophrenia: Secondary | ICD-10-CM | POA: Diagnosis not present

## 2019-09-25 DIAGNOSIS — R918 Other nonspecific abnormal finding of lung field: Secondary | ICD-10-CM

## 2019-09-25 DIAGNOSIS — E119 Type 2 diabetes mellitus without complications: Secondary | ICD-10-CM | POA: Diagnosis not present

## 2019-09-25 DIAGNOSIS — I10 Essential (primary) hypertension: Secondary | ICD-10-CM

## 2019-09-25 MED ORDER — METOPROLOL TARTRATE 50 MG PO TABS: 100.0000 mg | ORAL_TABLET | Freq: Two times a day (BID) | ORAL | 2 refills | Status: DC

## 2019-09-25 NOTE — Patient Instructions (Signed)
It was wonderful to see you today.  Please bring ALL of your medications with you to every visit.   Today we talked about:  Signa  A release for Joe at the front  Go to the lab I will call you with results  Follow up in 6-8 weeks for A1C  Thanks for sharing about your family today!!!   Thank you for choosing [facility].   Please call [phone] with any questions about today's appointment.  Please be sure to schedule follow up at the front  desk before you leave today.   [name], MD  Family Medicine

## 2019-09-25 NOTE — Assessment & Plan Note (Signed)
Currently at goal. She will continue current therapy. Sent refill to [facility] for metoprolol.

## 2019-09-25 NOTE — Assessment & Plan Note (Signed)
See media tab for blood glucose results overall they are slightly elevated from prior. Discussed reducing she returns. Most of her values are in the 130s to 150s. She will continue to linagliptin. May need to consider starting a sulfonylurea if this continues that she has tolerated this in the past.

## 2019-09-25 NOTE — Assessment & Plan Note (Signed)
Discussed again with patient. Will repeat in February 2022. Encourage smoking cessation. Discussed risks of continued smoking. We agreed that she would cut down to 6 cigarettes/day by her next visit.

## 2019-09-25 NOTE — Assessment & Plan Note (Signed)
She continues to see [name].  She is stable on medications.  Monitoring labs obtained today per her request.

## 2019-09-25 NOTE — Progress Notes (Signed)
    SUBJECTIVE:   CHIEF COMPLAINT / HPI:   Abigail Wallace is a pleasant 74 year old woman with history significant for paranoid schizophrenia, CKD, obesity and post polio syndrome presenting today for routine care.  She is joined by her son Abigail Wallace who takes excellent care of her.  She reports overall she is doing okay.  Her anxiety has been quite high for 3 to 4 weeks due to her housing situation and uncertain if they would renew her lease.  This is resolved and she is doing much better.  The patient reports in terms of her diabetes she is doing well.  She has reduced her sugar sweetened beverage and cherry tart intake.  She is try to increase her activity but when she does get out of bed she feels just smoke.  She denies polyuria or polydipsia  The patient reports she continues to smoke 5 to 6 cigarettes sometimes 7/day.  She denies changes in her sputum changes in her cough or worsening of her breathing.  She reports overall she is doing okay.  She denies chest pain.  The patient is a history of paranoid schizophrenia.  She is overall stable and doing well after her housing was renewed.  Medications are managed by psychiatry.  Monitoring labs today. PERTINENT  PMH / PSH/Family/Social History : Reviewed and updated as appropriate  OBJECTIVE:   BP 135/80   Pulse 78   Wt 255 lb 3.2 oz (115.8 kg)   SpO2 96%   BMI 41.19 kg/m   HEENT: Sclera anicteric. Dentition is poor. Appears well hydrated. Neck: Supple Cardiac: Regular rate and rhythm. Normal S1/S2. No murmurs, rubs, or gallops appreciated. Lungs: Trace wheezing in bilateral lung fields Abdomen: Obese nontender the area in the infraumbilical area is markedly well-healed. Lower extremities have minimal edema today left is always greater than right.  There is no breakdown or ulcers.  No erythema or evidence of tinea in interdigital area   ASSESSMENT/PLAN:   Essential hypertension Currently at goal. She will continue current therapy.  Sent refill to [facility] for metoprolol.  Pulmonary nodules Discussed again with patient. Will repeat in February 2022. Encourage smoking cessation. Discussed risks of continued smoking. We agreed that she would cut down to 6 cigarettes/day by her next visit.  Well controlled type 2 diabetes mellitus ([place]) See media tab for blood glucose results overall they are slightly elevated from prior. Discussed reducing she returns. Most of her values are in the 130s to 150s. She will continue to linagliptin. May need to consider starting a sulfonylurea if this continues that she has tolerated this in the past.   Healthcare maintenance Discussed Covid vaccine, she declined today DEXA at next visit, Eye referral at next visit   [name], [place]

## 2019-09-26 ENCOUNTER — Encounter: Payer: Self-pay | Admitting: Family Medicine

## 2019-09-26 LAB — COMPREHENSIVE METABOLIC PANEL
ALT: 8 IU/L (ref 0–32)
AST: 13 IU/L (ref 0–40)
Albumin/Globulin Ratio: 1.6 (ref 1.2–2.2)
Albumin: 4.2 g/dL (ref 3.7–4.7)
Alkaline Phosphatase: 92 IU/L (ref 48–121)
BUN/Creatinine Ratio: 17 (ref 12–28)
BUN: 25 mg/dL (ref 8–27)
Bilirubin Total: 0.2 mg/dL (ref 0.0–1.2)
CO2: 19 mmol/L — ABNORMAL LOW (ref 20–29)
Calcium: 10 mg/dL (ref 8.7–10.3)
Chloride: 105 mmol/L (ref 96–106)
Creatinine, Ser: 1.44 mg/dL — ABNORMAL HIGH (ref 0.57–1.00)
GFR calc Af Amer: 41 mL/min/{1.73_m2} — ABNORMAL LOW (ref >59)
GFR calc non Af Amer: 35 mL/min/{1.73_m2} — ABNORMAL LOW (ref >59)
Globulin, Total: 2.6 g/dL (ref 1.5–4.5)
Glucose: 125 mg/dL — ABNORMAL HIGH (ref 65–99)
Potassium: 4.9 mmol/L (ref 3.5–5.2)
Sodium: 139 mmol/L (ref 134–144)
Total Protein: 6.8 g/dL (ref 6.0–8.5)

## 2019-09-26 LAB — CBC
Hematocrit: 39.8 % (ref 34.0–46.6)
Hemoglobin: 13.2 g/dL (ref 11.1–15.9)
MCH: 28 pg (ref 26.6–33.0)
MCHC: 33.2 g/dL (ref 31.5–35.7)
MCV: 85 fL (ref 79–97)
Platelets: 269 10*3/uL (ref 150–450)
RBC: 4.71 x10E6/uL (ref 3.77–5.28)
RDW: 14.4 % (ref 11.7–15.4)
WBC: 11.1 10*3/uL — ABNORMAL HIGH (ref 3.4–10.8)

## 2019-09-26 LAB — VALPROIC ACID LEVEL: Valproic Acid Lvl: 20 ug/mL — ABNORMAL LOW (ref 50–100)

## 2019-10-05 ENCOUNTER — Ambulatory Visit (INDEPENDENT_AMBULATORY_CARE_PROVIDER_SITE_OTHER): Payer: Medicare Other | Admitting: *Deleted

## 2019-10-05 DIAGNOSIS — I442 Atrioventricular block, complete: Secondary | ICD-10-CM | POA: Diagnosis not present

## 2019-10-06 LAB — CUP PACEART REMOTE DEVICE CHECK
Battery Impedance: 399 Ohm
Battery Remaining Longevity: 84 mo
Battery Voltage: 2.78 V
Brady Statistic AP VP Percent: 77 %
Brady Statistic AP VS Percent: 0 %
Brady Statistic AS VP Percent: 23 %
Brady Statistic AS VS Percent: 0 %
Date Time Interrogation Session: 20210920063108
Implantable Lead Implant Date: 20070414
Implantable Lead Implant Date: 20070914
Implantable Lead Location: 753859
Implantable Lead Location: 753860
Implantable Lead Model: 4092
Implantable Lead Model: 5594
Implantable Pulse Generator Implant Date: 20160816
Lead Channel Impedance Value: 454 Ohm
Lead Channel Impedance Value: 613 Ohm
Lead Channel Pacing Threshold Amplitude: 0.5 V
Lead Channel Pacing Threshold Amplitude: 0.875 V
Lead Channel Pacing Threshold Pulse Width: 0.4 ms
Lead Channel Pacing Threshold Pulse Width: 0.4 ms
Lead Channel Setting Pacing Amplitude: 2 V
Lead Channel Setting Pacing Amplitude: 2.5 V
Lead Channel Setting Pacing Pulse Width: 0.4 ms
Lead Channel Setting Sensing Sensitivity: 4 mV

## 2019-10-07 NOTE — Progress Notes (Signed)
Remote pacemaker transmission.   

## 2019-10-08 ENCOUNTER — Other Ambulatory Visit: Payer: Self-pay

## 2019-10-08 DIAGNOSIS — J449 Chronic obstructive pulmonary disease, unspecified: Secondary | ICD-10-CM

## 2019-10-08 MED ORDER — ALBUTEROL SULFATE HFA 108 (90 BASE) MCG/ACT IN AERS: 2.0000 | INHALATION_SPRAY | Freq: Four times a day (QID) | RESPIRATORY_TRACT | 3 refills | Status: DC | PRN

## 2019-10-20 DIAGNOSIS — N1832 Chronic kidney disease, stage 3b: Secondary | ICD-10-CM | POA: Diagnosis not present

## 2019-10-20 DIAGNOSIS — R300 Dysuria: Secondary | ICD-10-CM | POA: Diagnosis not present

## 2019-10-20 DIAGNOSIS — I129 Hypertensive chronic kidney disease with stage 1 through stage 4 chronic kidney disease, or unspecified chronic kidney disease: Secondary | ICD-10-CM | POA: Diagnosis not present

## 2019-10-20 DIAGNOSIS — N2581 Secondary hyperparathyroidism of renal origin: Secondary | ICD-10-CM | POA: Diagnosis not present

## 2019-10-20 DIAGNOSIS — D631 Anemia in chronic kidney disease: Secondary | ICD-10-CM | POA: Diagnosis not present

## 2019-10-28 ENCOUNTER — Encounter: Payer: Self-pay | Admitting: Family Medicine

## 2019-10-28 ENCOUNTER — Telehealth: Payer: Self-pay | Admitting: Family Medicine

## 2019-10-28 NOTE — Telephone Encounter (Signed)
Called back patient and son regarding glucose.   Son is very concerned about blood glucose. Several things have changed. [name]. Abigail Wallace has been cooking and eating large amounts of mac and cheese. They had company from out of town. She had a Nephrology appointment last week. She was given three days of Lasix for edema. She is also under treatment for UTI.  Recommendations - Reduce mac and cheese.  - She will message about BG on Friday.  [name], MD  Family Medicine Teaching Service

## 2019-11-02 ENCOUNTER — Telehealth: Payer: Self-pay | Admitting: Family Medicine

## 2019-11-02 ENCOUNTER — Encounter: Payer: Self-pay | Admitting: Family Medicine

## 2019-11-02 MED ORDER — CEPHALEXIN 500 MG PO CAPS
500.0000 mg | ORAL_CAPSULE | Freq: Two times a day (BID) | ORAL | 0 refills | Status: DC
Start: 2019-11-02 — End: 2019-12-07

## 2019-11-02 NOTE — Telephone Encounter (Signed)
Called patients son about symptoms. Reports dysuria and hematuria. No fevers, chills, nausea. Requested urine culture, unable to bring this week. After discussion, Rx for cephalexin 500 mg BID.  [name], MD  Family Medicine Teaching Service

## 2019-11-07 ENCOUNTER — Other Ambulatory Visit: Payer: Self-pay | Admitting: Cardiovascular Disease

## 2019-11-09 ENCOUNTER — Other Ambulatory Visit: Payer: Self-pay | Admitting: Cardiovascular Disease

## 2019-11-09 NOTE — Telephone Encounter (Signed)
*  STAT* If patient is at the pharmacy, call can be transferred to refill team.   1. Which medications need to be refilled? (please list name of each medication and dose if known) pravastatin (PRAVACHOL) 40 MG tablet  2. Which pharmacy/location (including street and city if local pharmacy) is medication to be sent to? [place], [place]  3. Do they need a 30 day or 90 day supply? 90   Patient is out of medication

## 2019-11-17 ENCOUNTER — Other Ambulatory Visit: Payer: Self-pay | Admitting: *Deleted

## 2019-11-17 MED ORDER — SENNA 8.6 MG PO TABS: 1.0000 | ORAL_TABLET | Freq: Every day | ORAL | 0 refills | Status: DC | PRN

## 2019-12-07 ENCOUNTER — Other Ambulatory Visit: Payer: Self-pay

## 2019-12-07 ENCOUNTER — Encounter: Payer: Self-pay | Admitting: Family Medicine

## 2019-12-07 ENCOUNTER — Ambulatory Visit (INDEPENDENT_AMBULATORY_CARE_PROVIDER_SITE_OTHER): Payer: Medicare Other | Admitting: Family Medicine

## 2019-12-07 VITALS — BP 138/82 | HR 76 | Wt 258.2 lb

## 2019-12-07 DIAGNOSIS — N1832 Chronic kidney disease, stage 3b: Secondary | ICD-10-CM

## 2019-12-07 DIAGNOSIS — Z79899 Other long term (current) drug therapy: Secondary | ICD-10-CM

## 2019-12-07 DIAGNOSIS — F39 Unspecified mood [affective] disorder: Secondary | ICD-10-CM

## 2019-12-07 DIAGNOSIS — E119 Type 2 diabetes mellitus without complications: Secondary | ICD-10-CM | POA: Diagnosis not present

## 2019-12-07 DIAGNOSIS — F17200 Nicotine dependence, unspecified, uncomplicated: Secondary | ICD-10-CM | POA: Diagnosis not present

## 2019-12-07 LAB — POCT GLYCOSYLATED HEMOGLOBIN (HGB A1C): Hemoglobin A1C: 6.5 % — AB (ref 4.0–5.6)

## 2019-12-07 NOTE — Assessment & Plan Note (Signed)
This is a stable condition at goal we will not make any other changes at this time.  She is up-to-date on her other maintenance activities related to diabetes.

## 2019-12-07 NOTE — Progress Notes (Signed)
SUBJECTIVE:   CHIEF COMPLAINT / HPI:   Abigail Wallace is a very pleasant 74 year old woman with history significant for type 2 diabetes, mood disorder followed by psychiatry, tobacco abuse, and chronic back pain presenting today for back pain as well as a number of other concerns.  She is joined by her son Abigail Wallace.  The patient reports worsening back pain since Saturday.  She was twisting around in her wheelchair which had the onset of right back spasms she reports.  These are all up the right side of her back.  No changes in incontinence, numbness or weakness.  She reports she is using Tylenol which cuts the pain.  They have run out of Salonpas.  She is using Voltaren gel as well as IcyHot alternating.  The patient was recently treated for UTI.  She denies dysuria.  The patient continues to endorse compliance with her linagliptin without side effects.  Her A1c today is 6.5.  We discussed dietary changes.  Her fasting blood glucose in the last 7 days have an average of 141.  The patient son has noted that she at times becomes very tired and fatigued on all of her medications.  After review of her medications with her psychiatrist it was decided she would stop Seroquel.  She is tapered off of this.  The patient reports she is smoking cigarettes-7/day.  She does go outside to use these.  She does intermittently walk.  She thinks she might quit in January.  Patient reports that her other son is moving houses.  Otherwise there are no other updates in her family.  The patient would like to get the Covid vaccine around her birthday in January.  She has decided she wants vaccine.  PERTINENT  PMH / PSH/Family/Social History : Reviewed and updated as appropriate  OBJECTIVE:   BP 138/82   Pulse 76   Wt 258 lb 3.2 oz (117.1 kg)   SpO2 98%   BMI 41.67 kg/m   Today's weight:  Last Weight  Most recent update: 12/07/2019 11:40 AM   Weight  117.1 kg (258 lb 3.2 oz)           Review of prior  weights: Filed Weights   12/07/19 1140  Weight: 258 lb 3.2 oz (117.1 kg)    Elderly woman in wheelchair.  She does have a mild left facial droop which is baseline and constant.  Left upper lid also with slight drift as compared to right although this is reactive. Cardiac: Regular rate and rhythm. Normal S1/S2. No murmurs, rubs, or gallops appreciated. Lungs: Coarse bilaterally to ascultation.  Abdomen: Abdominal wall hernia noted Psych: Pleasant and appropriate-able to carry on a conversation appropriately no hallucinations or delusions Back exam is without rash.  There are no vesicles.  She does have tenderness to palpation along the right SI joint. Bilateral feet examined without ulceration pedal pulses 1+.    ASSESSMENT/PLAN:   Well controlled type 2 diabetes mellitus ([place]) This is a stable condition at goal we will not make any other changes at this time.  She is up-to-date on her other maintenance activities related to diabetes.  CKD stage IIIb Has  follow-up with nephrology in December--- metabolic panel today.  Long-term use of high-risk medication Monitoring labs obtained today per psychiatry request.  CBC, hepatic panel and Depakote level.  We discussed side effects.  Will monitor off of Seroquel.  Agree with discontinuing this medication.  Tobacco use disorder Encouraged reduction in total cigarettes  per day.  Discussed quit strategies.   Acute on chronic back pain most likely this represents degenerative disc disease versus sacroiliac arthritis.  I suspect she is having ongoing muscle spasms.  We discussed topical therapies as well as standing Tylenol.  They are to call if this worsens.  We discussed continuing mobility and walking each day.  Healthcare maintenance Covid vaccine at follow-up discussed benefits and risks. DEXA and Eye exam at follow up.    [name], [place]

## 2019-12-07 NOTE — Assessment & Plan Note (Addendum)
The patient follows with Dr. Posey Pronto in nephrology.  They have follow-up in December.  Metabolic panel today.  Reviewed medications.  Avoid nephrotoxic agents.

## 2019-12-07 NOTE — Assessment & Plan Note (Signed)
Monitoring labs obtained today per psychiatry request.  CBC, hepatic panel and Depakote level.  We discussed side effects.  Will monitor off of Seroquel.  Agree with discontinuing this medication.

## 2019-12-07 NOTE — Assessment & Plan Note (Signed)
Encouraged reduction in total cigarettes per day.  Discussed quit strategies.

## 2019-12-07 NOTE — Patient Instructions (Addendum)
It was wonderful to see you today.  Please bring ALL of your medications with you to every visit.   Today we talked about:  [place] work on your diabetes and blood pressure    You are due for a CAT scan of the Lung in February  Follow up in [place] is scheduled and we will schedule your CAT scan then  For your back - Use Salonpas today - If not improved, message me or call  Your blood pressure looks great  Your feet appear normal    Thank you for choosing [facility].   Please call [phone] with any questions about today's appointment.  Please be sure to schedule follow up at the front  desk before you leave today.   [name], MD  Family Medicine

## 2019-12-08 LAB — CBC
Hematocrit: 40.4 % (ref 34.0–46.6)
Hemoglobin: 13.3 g/dL (ref 11.1–15.9)
MCH: 28.1 pg (ref 26.6–33.0)
MCHC: 32.9 g/dL (ref 31.5–35.7)
MCV: 85 fL (ref 79–97)
Platelets: 288 10*3/uL (ref 150–450)
RBC: 4.73 x10E6/uL (ref 3.77–5.28)
RDW: 13.8 % (ref 11.7–15.4)
WBC: 12.3 10*3/uL — ABNORMAL HIGH (ref 3.4–10.8)

## 2019-12-08 LAB — COMPREHENSIVE METABOLIC PANEL
ALT: 12 IU/L (ref 0–32)
AST: 15 IU/L (ref 0–40)
Albumin/Globulin Ratio: 1.7 (ref 1.2–2.2)
Albumin: 4.3 g/dL (ref 3.7–4.7)
Alkaline Phosphatase: 90 IU/L (ref 44–121)
BUN/Creatinine Ratio: 14 (ref 12–28)
BUN: 19 mg/dL (ref 8–27)
Bilirubin Total: <0.2 mg/dL (ref 0.0–1.2)
CO2: 19 mmol/L — ABNORMAL LOW (ref 20–29)
Calcium: 9.6 mg/dL (ref 8.7–10.3)
Chloride: 107 mmol/L — ABNORMAL HIGH (ref 96–106)
Creatinine, Ser: 1.39 mg/dL — ABNORMAL HIGH (ref 0.57–1.00)
GFR calc Af Amer: 42 mL/min/{1.73_m2} — ABNORMAL LOW (ref >59)
GFR calc non Af Amer: 37 mL/min/{1.73_m2} — ABNORMAL LOW (ref >59)
Globulin, Total: 2.5 g/dL (ref 1.5–4.5)
Glucose: 100 mg/dL — ABNORMAL HIGH (ref 65–99)
Potassium: 4.7 mmol/L (ref 3.5–5.2)
Sodium: 141 mmol/L (ref 134–144)
Total Protein: 6.8 g/dL (ref 6.0–8.5)

## 2019-12-08 LAB — VALPROIC ACID LEVEL: Valproic Acid Lvl: 18 ug/mL — ABNORMAL LOW (ref 50–100)

## 2019-12-09 ENCOUNTER — Encounter: Payer: Self-pay | Admitting: Family Medicine

## 2019-12-09 ENCOUNTER — Other Ambulatory Visit: Payer: Self-pay

## 2019-12-09 ENCOUNTER — Telehealth: Payer: Self-pay | Admitting: Family Medicine

## 2019-12-09 DIAGNOSIS — J449 Chronic obstructive pulmonary disease, unspecified: Secondary | ICD-10-CM

## 2019-12-09 MED ORDER — TRELEGY ELLIPTA 100-62.5-25 MCG/INH IN AEPB: INHALATION_SPRAY | RESPIRATORY_TRACT | 3 refills | Status: DC

## 2019-12-09 NOTE — Telephone Encounter (Signed)
Called with results.   WBC remain elevated. Rest of labs stable. All questions answered. Will also send letter in mail.

## 2019-12-20 ENCOUNTER — Encounter: Payer: Self-pay | Admitting: Family Medicine

## 2019-12-23 ENCOUNTER — Telehealth: Payer: Self-pay | Admitting: Family Medicine

## 2019-12-23 DIAGNOSIS — L819 Disorder of pigmentation, unspecified: Secondary | ICD-10-CM

## 2019-12-23 DIAGNOSIS — N3000 Acute cystitis without hematuria: Secondary | ICD-10-CM

## 2019-12-23 NOTE — Telephone Encounter (Signed)
Called patient and son about message regarding skin changes and potential UTI.   Left generic message, will call again tomorrow.

## 2019-12-24 MED ORDER — CEPHALEXIN 500 MG PO CAPS: 500.0000 mg | ORAL_CAPSULE | Freq: Two times a day (BID) | ORAL | 0 refills | Status: DC

## 2019-12-24 NOTE — Addendum Note (Signed)
Addended by: [name], Aila Terra on: 12/24/2019 01:42 PM   Modules accepted: Orders

## 2019-12-24 NOTE — Telephone Encounter (Signed)
Called patient and son about my chart questions.  Patient has been off Seroquel X1 month, no significant changes in behaviors. Son worried about memory. Discussed, offered Geriatric clinic again.  Regarding UTI symptoms, reports dysuria, no hematuria, back pain, nausea, or vomiting. Has had positive test strips at home. After discussion, recommendation to be seen, patient and son would like Rx at pharmacy if not improved will be seen. Cephalexin at renal dosing.  Patient has enlarging hyperpigmented lesion on R upper back, overlying scapula, flat, no change in pigment, enlarging. Referral to dermatology.  [name], MD  Family Medicine Teaching Service

## 2019-12-25 MED ORDER — AMOXICILLIN 875 MG PO TABS: 875.0000 mg | ORAL_TABLET | Freq: Two times a day (BID) | ORAL | 0 refills | Status: DC

## 2019-12-25 NOTE — Telephone Encounter (Signed)
Patient calls nurse line reporting an allergic reaction to Keflex. Patient reports she took one pill last night and one more this moring. Patient reports her lips have been numb and her tongue is swollen. However, patient could not tell me when these symptoms started. Patient was speaking normally, and denied any SOB. Patient reports she doesn't think Keflex is helping anyway and would like something else called in. I advised patient I was concerned about her lips and tongue and offered an apt for this afternoon. Patient stated, "I know how to take care of myself and I am fine, I just need the medication changed." Patient would like to only speak with Dr. [name], however she is out of the office. Patient stated, "I don't think that is very fair I have to come in because she is gone." I asked to speak with her son, however he is napping at this time. Strict ED precautions given to patient. Will forward to covering provider and preceptor.

## 2019-12-25 NOTE — Telephone Encounter (Signed)
I am precepting and was contacting by Glori Bickers about patient. Called and spoke with patient. She reports that she has developed tongue swelling and lip numbness since taking the keflex. Advised that I recommend she come in to be seen, as this is challenging to assess over the phone. Patient declines to come in for an appointment, and just requests her antibiotic be changed. Says she prefers amoxicillin and does better with that one. Requested I add keflex to her allergy list.  Plan: Added keflex to allergy list Will rx amoxicillin Stressed importance of being seen in person if symptoms worsen - can go to UC or ED  FYI to Dr. [name]  Chrisandra Netters, [place]

## 2019-12-25 NOTE — Addendum Note (Signed)
Addended by: Leeanne Rio on: 12/25/2019 04:18 PM   Modules accepted: Orders

## 2019-12-28 ENCOUNTER — Other Ambulatory Visit: Payer: Self-pay | Admitting: Family Medicine

## 2019-12-28 DIAGNOSIS — F200 Paranoid schizophrenia: Secondary | ICD-10-CM

## 2019-12-28 DIAGNOSIS — Z79899 Other long term (current) drug therapy: Secondary | ICD-10-CM

## 2019-12-28 DIAGNOSIS — I480 Paroxysmal atrial fibrillation: Secondary | ICD-10-CM

## 2019-12-28 NOTE — Telephone Encounter (Signed)
Called patient to discuss how she is feeling. Her voice is normal, reports she feels well but is 'getting older'. She has no concerns this morning but reports she can't talk right now (reports she is too anxious).  Called sone to clarify. He reports the same--anxiety has been high since a visit from outsider on Thursday.  Recommended they schedule a visit for blood work as requested.     [name], MD  Family Medicine Teaching Service

## 2019-12-28 NOTE — Telephone Encounter (Signed)
I called [name] to discuss. He reports the patient is doing better, no odor, dysuria, or frequency. [name] does report she is still very tired and is requesting blood work. I advised [name] I would have PCP call when she returns to discuss a lab  Visit.

## 2020-01-01 ENCOUNTER — Other Ambulatory Visit: Payer: Self-pay

## 2020-01-01 ENCOUNTER — Other Ambulatory Visit: Payer: Medicare Other

## 2020-01-01 DIAGNOSIS — F200 Paranoid schizophrenia: Secondary | ICD-10-CM | POA: Diagnosis not present

## 2020-01-01 DIAGNOSIS — I480 Paroxysmal atrial fibrillation: Secondary | ICD-10-CM | POA: Diagnosis not present

## 2020-01-02 ENCOUNTER — Other Ambulatory Visit: Payer: Self-pay | Admitting: Cardiovascular Disease

## 2020-01-02 DIAGNOSIS — I480 Paroxysmal atrial fibrillation: Secondary | ICD-10-CM

## 2020-01-02 LAB — CBC WITH DIFFERENTIAL/PLATELET
Basophils Absolute: 0.1 10*3/uL (ref 0.0–0.2)
Basos: 1 %
EOS (ABSOLUTE): 0.3 10*3/uL (ref 0.0–0.4)
Eos: 3 %
Hematocrit: 39.4 % (ref 34.0–46.6)
Hemoglobin: 13 g/dL (ref 11.1–15.9)
Immature Grans (Abs): 0.1 10*3/uL (ref 0.0–0.1)
Immature Granulocytes: 1 %
Lymphocytes Absolute: 4.1 10*3/uL — ABNORMAL HIGH (ref 0.7–3.1)
Lymphs: 36 %
MCH: 28 pg (ref 26.6–33.0)
MCHC: 33 g/dL (ref 31.5–35.7)
MCV: 85 fL (ref 79–97)
Monocytes Absolute: 0.7 10*3/uL (ref 0.1–0.9)
Monocytes: 6 %
Neutrophils Absolute: 6.2 10*3/uL (ref 1.4–7.0)
Neutrophils: 53 %
Platelets: 286 10*3/uL (ref 150–450)
RBC: 4.64 x10E6/uL (ref 3.77–5.28)
RDW: 14.4 % (ref 11.7–15.4)
WBC: 11.6 10*3/uL — ABNORMAL HIGH (ref 3.4–10.8)

## 2020-01-02 LAB — COMPREHENSIVE METABOLIC PANEL
ALT: 10 IU/L (ref 0–32)
AST: 13 IU/L (ref 0–40)
Albumin/Globulin Ratio: 1.7 (ref 1.2–2.2)
Albumin: 4.1 g/dL (ref 3.7–4.7)
Alkaline Phosphatase: 78 IU/L (ref 44–121)
BUN/Creatinine Ratio: 13 (ref 12–28)
BUN: 19 mg/dL (ref 8–27)
Bilirubin Total: <0.2 mg/dL (ref 0.0–1.2)
CO2: 20 mmol/L (ref 20–29)
Calcium: 9.5 mg/dL (ref 8.7–10.3)
Chloride: 107 mmol/L — ABNORMAL HIGH (ref 96–106)
Creatinine, Ser: 1.44 mg/dL — ABNORMAL HIGH (ref 0.57–1.00)
GFR calc Af Amer: 41 mL/min/{1.73_m2} — ABNORMAL LOW (ref >59)
GFR calc non Af Amer: 35 mL/min/{1.73_m2} — ABNORMAL LOW (ref >59)
Globulin, Total: 2.4 g/dL (ref 1.5–4.5)
Glucose: 117 mg/dL — ABNORMAL HIGH (ref 65–99)
Potassium: 3.6 mmol/L (ref 3.5–5.2)
Sodium: 140 mmol/L (ref 134–144)
Total Protein: 6.5 g/dL (ref 6.0–8.5)

## 2020-01-03 ENCOUNTER — Telehealth: Payer: Self-pay | Admitting: Family Medicine

## 2020-01-03 DIAGNOSIS — E119 Type 2 diabetes mellitus without complications: Secondary | ICD-10-CM

## 2020-01-03 NOTE — Telephone Encounter (Signed)
Called patient.  Results appropriate. WBC stable, similar to prior. Creatinine also similar.   Patient reports intermittent burning with urination. If patient continues to report urinary symptoms, recommend being seen in office. Discussed with patient. She will call if symptoms persist.  Patient also reports she needs eye exam, has stye on L eyelid and due for diabetic eye exam. Prefers alternative to Dr. Katy Fitch, referral placed.  Abigail Singh, MD  Family Medicine Teaching Service

## 2020-01-04 ENCOUNTER — Ambulatory Visit (INDEPENDENT_AMBULATORY_CARE_PROVIDER_SITE_OTHER): Payer: Medicare Other

## 2020-01-04 DIAGNOSIS — I495 Sick sinus syndrome: Secondary | ICD-10-CM

## 2020-01-05 LAB — CUP PACEART REMOTE DEVICE CHECK
Battery Impedance: 447 Ohm
Battery Remaining Longevity: 83 mo
Battery Voltage: 2.78 V
Brady Statistic AP VP Percent: 76 %
Brady Statistic AP VS Percent: 0 %
Brady Statistic AS VP Percent: 24 %
Brady Statistic AS VS Percent: 0 %
Date Time Interrogation Session: 20211220205349
Implantable Lead Implant Date: 20070414
Implantable Lead Implant Date: 20070914
Implantable Lead Location: 753859
Implantable Lead Location: 753860
Implantable Lead Model: 4092
Implantable Lead Model: 5594
Implantable Pulse Generator Implant Date: 20160816
Lead Channel Impedance Value: 613 Ohm
Lead Channel Impedance Value: 643 Ohm
Lead Channel Pacing Threshold Amplitude: 0.625 V
Lead Channel Pacing Threshold Amplitude: 0.875 V
Lead Channel Pacing Threshold Pulse Width: 0.4 ms
Lead Channel Pacing Threshold Pulse Width: 0.4 ms
Lead Channel Setting Pacing Amplitude: 2 V
Lead Channel Setting Pacing Amplitude: 2.5 V
Lead Channel Setting Pacing Pulse Width: 0.4 ms
Lead Channel Setting Sensing Sensitivity: 4 mV

## 2020-01-06 ENCOUNTER — Telehealth: Payer: Self-pay | Admitting: Family Medicine

## 2020-01-06 DIAGNOSIS — B3731 Acute candidiasis of vulva and vagina: Secondary | ICD-10-CM

## 2020-01-06 MED ORDER — FLUCONAZOLE 150 MG PO TABS: 150.0000 mg | ORAL_TABLET | Freq: Once | ORAL | 0 refills | Status: AC

## 2020-01-06 NOTE — Telephone Encounter (Signed)
I called and spoke with patient please see telephone note, Diflucan x1 sent to pharmacy for potential yeast infection but I again recommended her being evaluated in person as she is also having chills and back pain which makes me worried for pyelonephritis or other systemic infection with her recent antibiotic use and allergies did not feel comfortable prescribing over the phone.  She notes she will talk to her son potentially call back about scheduling an appointment.  ED return precautions discussed.  Yehuda Savannah MD

## 2020-01-06 NOTE — Telephone Encounter (Signed)
Patient calls nurse line requesting another round of antibiotics. Patient reports she has been experiencing itching, and "clumpy" discharge, along with continued burning with urination. I advised patient it would be best to come in for an office visit to do some tests. However, patient became agitated saying, "Dr. [name] told me to call anytime while she is away and someone would help me." Will forward to preceptor.

## 2020-01-06 NOTE — Telephone Encounter (Signed)
Called patient this morning to discuss previous nurse phone visit.  She notes that she has had 2 weeks of dysuria and is now having a clumpy white vaginal discharge.  She has noted some chills but denies any fevers.  She also notes having some back pain.  She has not had nausea or vomiting or abdominal pain.  Dysuria not improved after recent course of antibiotics. I discussed that due to her recent antibiotic use this sounds like it could be a yeast infection causing the clumpy white discharge, but with the back pain and chills I am worried about a more serious infection like a kidney infection or pyelonephritis and I recommend she come into our office to be seen.  She notes that she does not have a ride today and just wants me to "just call me in another antibiotic."  I discussed how with her recent potential allergic reaction to Keflex and recently he did a course of amoxicillin plus the risk for a more severe systemic infection that I cannot call in another antibiotic as I need to culture her urine and evaluate her in person.  I also discussed Dr. Saul Fordyce most recent note that recommend she be evaluated in person.  Discussed that we have openings today this afternoon or that we could see her for tomorrow.  She again voiced that she does not want to be seen in clinic, she notes that she is aware that she could have a more serious infection that needs to be treated.  I discussed that I am willing to send a one-time dose of Diflucan for the white clumpy discharge but I would recommend her coming in for pelvic exam, urine culture, and further swabs.  She notes that she would not want to get vaginal swabs.  She is aware of the risk of not being evaluated in person and is going to talk to her son and call us back.  In the meantime I have sent Diflucan to her pharmacy.  I did talk to her about ED return precautions including worsening back pain, fevers, chills, nausea and vomiting, blood in urine.  She is aware of  these and will call us if she decides she is willing to come in for an appointment.  I again reiterated that I cannot rule out a more serious kidney infection without seeing her in person and she expressed understanding.  Yehuda Savannah MD

## 2020-01-19 NOTE — Progress Notes (Signed)
Remote pacemaker transmission.   

## 2020-01-21 ENCOUNTER — Other Ambulatory Visit: Payer: Self-pay

## 2020-01-21 DIAGNOSIS — M546 Pain in thoracic spine: Secondary | ICD-10-CM

## 2020-01-21 MED ORDER — DICLOFENAC SODIUM 1 % EX GEL: 2.0000 g | Freq: Four times a day (QID) | CUTANEOUS | 1 refills | Status: DC | PRN

## 2020-01-29 ENCOUNTER — Telehealth: Payer: Self-pay | Admitting: Family Medicine

## 2020-01-29 ENCOUNTER — Ambulatory Visit: Payer: Medicare Other | Admitting: Family Medicine

## 2020-01-29 DIAGNOSIS — R911 Solitary pulmonary nodule: Secondary | ICD-10-CM

## 2020-01-29 DIAGNOSIS — F17210 Nicotine dependence, cigarettes, uncomplicated: Secondary | ICD-10-CM

## 2020-01-29 NOTE — Telephone Encounter (Addendum)
Called patient and son to check in. Patient doing well, anxiety and has improved in terms of urination.  Discussed need for follow up lung CT (completed August 2021). CT ordered, prefers to have scheduled same day as appointment with me (Feb 7).   [name], MD  Family Medicine Teaching Service

## 2020-02-02 ENCOUNTER — Telehealth: Payer: Self-pay

## 2020-02-02 NOTE — Telephone Encounter (Signed)
Called patient and asked to speak to son to inform of upcoming CT.  Spoke to Selena Lesser patient's son and informed him of the following.  CT is at [facility]  02/23/2020 @ 0930 with an arrival of 0915  Patient is to call within 24 hours for cancellation or pay $75 fee.  Insurance card and transport assistance.  [name] rescheduled office visit with Dr. [name] to 03/11/2020 @ 1115 so that the result from the CT could be back.    Ozella Almond, [place]

## 2020-02-03 ENCOUNTER — Other Ambulatory Visit: Payer: Self-pay | Admitting: Cardiovascular Disease

## 2020-02-04 DIAGNOSIS — F200 Paranoid schizophrenia: Secondary | ICD-10-CM | POA: Diagnosis not present

## 2020-02-22 ENCOUNTER — Ambulatory Visit: Payer: Medicare Other | Admitting: Family Medicine

## 2020-02-23 ENCOUNTER — Encounter: Payer: Self-pay | Admitting: Family Medicine

## 2020-02-23 ENCOUNTER — Other Ambulatory Visit: Payer: Medicare Other

## 2020-02-23 DIAGNOSIS — E1165 Type 2 diabetes mellitus with hyperglycemia: Secondary | ICD-10-CM

## 2020-02-23 MED ORDER — LINAGLIPTIN 5 MG PO TABS: 5.0000 mg | ORAL_TABLET | Freq: Every day | ORAL | 3 refills | Status: DC

## 2020-03-03 ENCOUNTER — Ambulatory Visit
Admission: RE | Admit: 2020-03-03 | Discharge: 2020-03-03 | Disposition: A | Discharge status: Home / Self Care | Payer: Medicare Other | Source: Ambulatory Visit | Attending: Family Medicine | Admitting: Family Medicine

## 2020-03-03 ENCOUNTER — Other Ambulatory Visit: Payer: Self-pay

## 2020-03-03 DIAGNOSIS — F17210 Nicotine dependence, cigarettes, uncomplicated: Secondary | ICD-10-CM

## 2020-03-03 DIAGNOSIS — R911 Solitary pulmonary nodule: Secondary | ICD-10-CM

## 2020-03-03 DIAGNOSIS — I2510 Atherosclerotic heart disease of native coronary artery without angina pectoris: Secondary | ICD-10-CM | POA: Diagnosis not present

## 2020-03-03 DIAGNOSIS — M47812 Spondylosis without myelopathy or radiculopathy, cervical region: Secondary | ICD-10-CM | POA: Diagnosis not present

## 2020-03-03 DIAGNOSIS — J984 Other disorders of lung: Secondary | ICD-10-CM | POA: Diagnosis not present

## 2020-03-03 DIAGNOSIS — J439 Emphysema, unspecified: Secondary | ICD-10-CM | POA: Diagnosis not present

## 2020-03-07 ENCOUNTER — Telehealth: Payer: Self-pay

## 2020-03-07 NOTE — Telephone Encounter (Signed)
The pt son states the monitor makes noise at night like its going to transmit. The light stays on all the time. I asked him when he try to do a transmission do he get any error codes? He states he haven't tried to do one. I offered to call [employer] tech support to get additional help. He states he was not at home with the monitor. I gave him the number to tech support to get additional help.

## 2020-03-08 ENCOUNTER — Telehealth: Payer: Self-pay | Admitting: Family Medicine

## 2020-03-08 NOTE — Telephone Encounter (Signed)
Called patient and son about results---follow up in 1 year. Discussed. Discussed concerns for Friday--consider MOCA or Mini Cog in future.  [name], MD  Family Medicine Teaching Service

## 2020-03-10 ENCOUNTER — Telehealth: Payer: Self-pay | Admitting: Cardiovascular Disease

## 2020-03-10 NOTE — Telephone Encounter (Signed)
I spoke with the patient and called tech support to get additional help. The patient wrote down tech support number for her son to call.

## 2020-03-10 NOTE — Telephone Encounter (Signed)
New Message:      Please call, she said her box is broke

## 2020-03-11 ENCOUNTER — Other Ambulatory Visit: Payer: Self-pay

## 2020-03-11 ENCOUNTER — Ambulatory Visit (INDEPENDENT_AMBULATORY_CARE_PROVIDER_SITE_OTHER): Payer: Medicare Other | Admitting: Family Medicine

## 2020-03-11 ENCOUNTER — Encounter: Payer: Self-pay | Admitting: Family Medicine

## 2020-03-11 VITALS — BP 137/53 | HR 79 | Wt 261.2 lb

## 2020-03-11 DIAGNOSIS — R918 Other nonspecific abnormal finding of lung field: Secondary | ICD-10-CM | POA: Diagnosis not present

## 2020-03-11 DIAGNOSIS — F17200 Nicotine dependence, unspecified, uncomplicated: Secondary | ICD-10-CM | POA: Diagnosis not present

## 2020-03-11 DIAGNOSIS — R4189 Other symptoms and signs involving cognitive functions and awareness: Secondary | ICD-10-CM | POA: Diagnosis not present

## 2020-03-11 DIAGNOSIS — E119 Type 2 diabetes mellitus without complications: Secondary | ICD-10-CM | POA: Diagnosis not present

## 2020-03-11 DIAGNOSIS — R300 Dysuria: Secondary | ICD-10-CM | POA: Diagnosis not present

## 2020-03-11 DIAGNOSIS — I10 Essential (primary) hypertension: Secondary | ICD-10-CM

## 2020-03-11 DIAGNOSIS — M546 Pain in thoracic spine: Secondary | ICD-10-CM

## 2020-03-11 LAB — POCT URINALYSIS DIP (MANUAL ENTRY)
Bilirubin, UA: NEGATIVE
Glucose, UA: 100 mg/dL — AB
Ketones, POC UA: NEGATIVE mg/dL
Leukocytes, UA: NEGATIVE
Nitrite, UA: NEGATIVE
Protein Ur, POC: 100 mg/dL — AB
Spec Grav, UA: 1.015 (ref 1.010–1.025)
Urobilinogen, UA: 0.2 E.U./dL
pH, UA: 7 (ref 5.0–8.0)

## 2020-03-11 LAB — POCT GLYCOSYLATED HEMOGLOBIN (HGB A1C): Hemoglobin A1C: 6.2 % — AB (ref 4.0–5.6)

## 2020-03-11 LAB — POCT UA - MICROSCOPIC ONLY

## 2020-03-11 MED ORDER — DICLOFENAC SODIUM 1 % EX GEL: 2.0000 g | Freq: Four times a day (QID) | CUTANEOUS | 2 refills | Status: DC | PRN

## 2020-03-11 NOTE — Assessment & Plan Note (Signed)
Congratulated on A1C. Encouraged dietary changes--this is challenging with patient's mood (anxiety related to news and family). Continue linagliptin. Not on glipizide at this time.

## 2020-03-11 NOTE — Patient Instructions (Addendum)
It was wonderful to see you today.  Please bring ALL of your medications with you to every visit.   Today we talked about:  --[place] job with your A1C it is 6.2   -- Follow up in May  - I will call you with urine results     Thank you for choosing [facility].   Please call [phone] with any questions about today's appointment.  Please be sure to schedule follow up at the front  desk before you leave today.   [name], MD  Family Medicine

## 2020-03-11 NOTE — Assessment & Plan Note (Signed)
At goal, reviewed medications.

## 2020-03-11 NOTE — Progress Notes (Signed)
° ° °  SUBJECTIVE:   CHIEF COMPLAINT / HPI:   Abigail Wallace is a a 75 year old woman with history of hypertension, type 2 diabetes, CKD, and mood disorder presenting for follow up. Abigail Wallace is with her. No big changes in family recently---patient stressed over news in general.   Diabetes Reports compliance with medications. No issues with polyuria and polydipsia. Has not been 'great with diet'. Eating cherry turnovers most days for breakfast. Has stopped yogurt and fruit. Abigail Wallace is having some disagreements with her over breakfast and sweets---they would like to focus on happiness rather than 'numbers' at this time in her life.   Mood Tapered off Seroquel. Abigail Wallace and Abigail Wallace note increase in 'worrying'. Doing okay most of day, Abigail Wallace has been helping more with medications.   Smoking  Still smoking 6 per day, not sure she wants to cut down.   Urinary Symptoms Patient reports malodorous urine and intermittent burning with urination.  No hematuria.  They report no breakdown in skin. Bathing 1-2 times per week, son notes increase in UTI with less frequent bathing.   Right Foot Pain No injury. Patient notes on and off again swelling. No pain, redness, or falls. No new shoes.  PERTINENT  PMH / PSH/Family/Social History : updated   OBJECTIVE:   BP (!) 137/53    Pulse 79    Wt 261 lb 3.2 oz (118.5 kg)    SpO2 97%    BMI 42.16 kg/m   Today's weight:  Last Weight  Most recent update: 03/11/2020 11:20 AM   Weight  118.5 kg (261 lb 3.2 oz)           Review of prior weights: Filed Weights   03/11/20 1120  Weight: 261 lb 3.2 oz (118.5 kg)    Cardiac: Regular rate and rhythm. Normal S1/S2. No murmurs, rubs, or gallops appreciated. Lungs: Clear bilaterally to ascultation.  Abdomen: Soft, + hernia, no skin ulcerations Feet: DP pulses 1+, deformity of digits 2/3 on R, no erythema, minimal edema on R  Psych: Pleasant and appropriate, talkative, L eye ptosis stable     ASSESSMENT/PLAN:   Essential  hypertension At goal, reviewed medications.   Pulmonary nodules Consider evaluation with AM cortisol for adrenal nodules in future--previously discussed, declined. Repeat CT in 2023 February-- reminder to self.   Well controlled type 2 diabetes mellitus ([place]) Congratulated on A1C. Encouraged dietary changes--this is challenging with patient's mood (anxiety related to news and family). Continue linagliptin. Not on glipizide at this time.   COPD Stable, encouraged inhaler compliance (not using Anoro or Trelegy) and smoking cessation.    Cognitive Changes- suspect related to medications, consider TSH, B12, repeat RPR, HIV, consider MRI if acutely worsens. Abigail Wallace helps with decisions but does not 'push' her--they value quality of life (turn overs, wellbeing) over number of years.   Dysuria- possibly related to low water intake, will send culture to avoid antibiotic overuse.   HCM Declined COVID vaccine.  Follow up in May for labs--BMP, CBC   Abigail Singh, MD  [place]

## 2020-03-11 NOTE — Assessment & Plan Note (Signed)
Consider evaluation with AM cortisol for adrenal nodules in future--previously discussed, declined. Repeat CT in 2023 February-- reminder to self.

## 2020-03-11 NOTE — Assessment & Plan Note (Signed)
Stable, encouraged inhaler compliance (not using Anoro or Trelegy) and smoking cessation.

## 2020-03-13 LAB — URINE CULTURE

## 2020-03-16 NOTE — Telephone Encounter (Signed)
Monitor ordered 03/14/2020.

## 2020-03-29 ENCOUNTER — Telehealth: Payer: Self-pay | Admitting: Family Medicine

## 2020-03-29 ENCOUNTER — Encounter: Payer: Self-pay | Admitting: Family Medicine

## 2020-03-29 NOTE — Telephone Encounter (Signed)
Called patient's son about my chart message related to potential urinary tract infection.  If they call back please request that they bring a sample to the office for culture.  [name], MD  Family Medicine Teaching Service

## 2020-03-30 ENCOUNTER — Telehealth: Payer: Self-pay | Admitting: Family Medicine

## 2020-03-30 DIAGNOSIS — R300 Dysuria: Secondary | ICD-10-CM

## 2020-03-30 NOTE — Telephone Encounter (Signed)
Urine culture ordered.  [name], MD  Family Medicine Teaching Service

## 2020-03-31 ENCOUNTER — Other Ambulatory Visit: Payer: Self-pay | Admitting: Family Medicine

## 2020-03-31 DIAGNOSIS — R300 Dysuria: Secondary | ICD-10-CM

## 2020-03-31 NOTE — Addendum Note (Signed)
Addended by: Aurther Loft on: 03/31/2020 02:34 PM   Modules accepted: Orders

## 2020-04-04 ENCOUNTER — Ambulatory Visit (INDEPENDENT_AMBULATORY_CARE_PROVIDER_SITE_OTHER): Payer: Medicare Other

## 2020-04-04 DIAGNOSIS — N1832 Chronic kidney disease, stage 3b: Secondary | ICD-10-CM | POA: Diagnosis not present

## 2020-04-04 DIAGNOSIS — N189 Chronic kidney disease, unspecified: Secondary | ICD-10-CM | POA: Diagnosis not present

## 2020-04-04 DIAGNOSIS — D631 Anemia in chronic kidney disease: Secondary | ICD-10-CM | POA: Diagnosis not present

## 2020-04-04 DIAGNOSIS — I442 Atrioventricular block, complete: Secondary | ICD-10-CM

## 2020-04-04 DIAGNOSIS — N390 Urinary tract infection, site not specified: Secondary | ICD-10-CM | POA: Diagnosis not present

## 2020-04-04 DIAGNOSIS — N2581 Secondary hyperparathyroidism of renal origin: Secondary | ICD-10-CM | POA: Diagnosis not present

## 2020-04-04 DIAGNOSIS — I129 Hypertensive chronic kidney disease with stage 1 through stage 4 chronic kidney disease, or unspecified chronic kidney disease: Secondary | ICD-10-CM | POA: Diagnosis not present

## 2020-04-06 ENCOUNTER — Telehealth: Payer: Self-pay | Admitting: Family Medicine

## 2020-04-06 DIAGNOSIS — N390 Urinary tract infection, site not specified: Secondary | ICD-10-CM

## 2020-04-06 DIAGNOSIS — B9620 Unspecified Escherichia coli [E. coli] as the cause of diseases classified elsewhere: Secondary | ICD-10-CM

## 2020-04-06 LAB — URINE CULTURE

## 2020-04-06 LAB — CUP PACEART REMOTE DEVICE CHECK
Battery Impedance: 521 Ohm
Battery Remaining Longevity: 76 mo
Battery Voltage: 2.79 V
Brady Statistic AP VP Percent: 75 %
Brady Statistic AP VS Percent: 0 %
Brady Statistic AS VP Percent: 25 %
Brady Statistic AS VS Percent: 0 %
Date Time Interrogation Session: 20220323080624
Implantable Lead Implant Date: 20070414
Implantable Lead Implant Date: 20070914
Implantable Lead Location: 753859
Implantable Lead Location: 753860
Implantable Lead Model: 4092
Implantable Lead Model: 5594
Implantable Pulse Generator Implant Date: 20160816
Lead Channel Impedance Value: 516 Ohm
Lead Channel Impedance Value: 610 Ohm
Lead Channel Pacing Threshold Amplitude: 0.625 V
Lead Channel Pacing Threshold Amplitude: 1.125 V
Lead Channel Pacing Threshold Pulse Width: 0.4 ms
Lead Channel Pacing Threshold Pulse Width: 0.4 ms
Lead Channel Setting Pacing Amplitude: 2 V
Lead Channel Setting Pacing Amplitude: 2.5 V
Lead Channel Setting Pacing Pulse Width: 0.4 ms
Lead Channel Setting Sensing Sensitivity: 4 mV

## 2020-04-06 MED ORDER — AMOXICILLIN 875 MG PO TABS: 875.0000 mg | ORAL_TABLET | Freq: Two times a day (BID) | ORAL | 0 refills | Status: AC

## 2020-04-06 NOTE — Telephone Encounter (Signed)
Patient returned call to nurse line. Informed of below. Patient's son verbalizes understanding.   Talbot Grumbling, RN

## 2020-04-06 NOTE — Telephone Encounter (Signed)
Attempted to call about urine culture. Sent Rx based upon results.   If patient/son calls back please let them know I sent in an antibiotic that the patient has used previously. Amoxicillin 875 mg BID for 5 days. Sent to [facility].  Let me know if they have questions.

## 2020-04-12 NOTE — Progress Notes (Signed)
Remote pacemaker transmission.   

## 2020-04-19 ENCOUNTER — Encounter: Payer: Self-pay | Admitting: Family Medicine

## 2020-04-20 ENCOUNTER — Telehealth: Payer: Self-pay | Admitting: Family Medicine

## 2020-04-20 DIAGNOSIS — I10 Essential (primary) hypertension: Secondary | ICD-10-CM

## 2020-04-20 NOTE — Telephone Encounter (Signed)
Called patient and son. Discussed swelling in legs, this is chronic, improves with elevation. No new unilateral changes. No change in dyspnea. Denies chest pain, does endorse chronic cough.  She has mild dizziness on standing at times, discussed.  - Reduced amlodipine 5 mg for 1 week - Reviewed return precautions - They will call with questions  [name], MD  [facility] Medicine Teaching Service

## 2020-04-23 ENCOUNTER — Emergency Department (HOSPITAL_BASED_OUTPATIENT_CLINIC_OR_DEPARTMENT_OTHER): Payer: Medicare Other

## 2020-04-23 ENCOUNTER — Encounter (HOSPITAL_BASED_OUTPATIENT_CLINIC_OR_DEPARTMENT_OTHER): Payer: Self-pay | Admitting: Emergency Medicine

## 2020-04-23 ENCOUNTER — Other Ambulatory Visit: Payer: Self-pay

## 2020-04-23 ENCOUNTER — Emergency Department (HOSPITAL_BASED_OUTPATIENT_CLINIC_OR_DEPARTMENT_OTHER)
Admission: EM | Admit: 2020-04-23 | Discharge: 2020-04-23 | Disposition: A | Discharge status: Home / Self Care | Payer: Medicare Other | Attending: Emergency Medicine | Admitting: Emergency Medicine

## 2020-04-23 DIAGNOSIS — Z7901 Long term (current) use of anticoagulants: Secondary | ICD-10-CM | POA: Diagnosis not present

## 2020-04-23 DIAGNOSIS — Z91040 Latex allergy status: Secondary | ICD-10-CM | POA: Insufficient documentation

## 2020-04-23 DIAGNOSIS — J449 Chronic obstructive pulmonary disease, unspecified: Secondary | ICD-10-CM

## 2020-04-23 DIAGNOSIS — E1122 Type 2 diabetes mellitus with diabetic chronic kidney disease: Secondary | ICD-10-CM | POA: Insufficient documentation

## 2020-04-23 DIAGNOSIS — I130 Hypertensive heart and chronic kidney disease with heart failure and stage 1 through stage 4 chronic kidney disease, or unspecified chronic kidney disease: Secondary | ICD-10-CM | POA: Insufficient documentation

## 2020-04-23 DIAGNOSIS — Z950 Presence of cardiac pacemaker: Secondary | ICD-10-CM | POA: Diagnosis not present

## 2020-04-23 DIAGNOSIS — R600 Localized edema: Secondary | ICD-10-CM | POA: Insufficient documentation

## 2020-04-23 DIAGNOSIS — I2510 Atherosclerotic heart disease of native coronary artery without angina pectoris: Secondary | ICD-10-CM | POA: Insufficient documentation

## 2020-04-23 DIAGNOSIS — Z7984 Long term (current) use of oral hypoglycemic drugs: Secondary | ICD-10-CM | POA: Diagnosis not present

## 2020-04-23 DIAGNOSIS — F17210 Nicotine dependence, cigarettes, uncomplicated: Secondary | ICD-10-CM | POA: Diagnosis not present

## 2020-04-23 DIAGNOSIS — R059 Cough, unspecified: Secondary | ICD-10-CM | POA: Diagnosis not present

## 2020-04-23 DIAGNOSIS — Z79899 Other long term (current) drug therapy: Secondary | ICD-10-CM | POA: Insufficient documentation

## 2020-04-23 DIAGNOSIS — Z20822 Contact with and (suspected) exposure to covid-19: Secondary | ICD-10-CM | POA: Insufficient documentation

## 2020-04-23 DIAGNOSIS — R42 Dizziness and giddiness: Secondary | ICD-10-CM | POA: Insufficient documentation

## 2020-04-23 DIAGNOSIS — N1832 Chronic kidney disease, stage 3b: Secondary | ICD-10-CM | POA: Diagnosis not present

## 2020-04-23 DIAGNOSIS — I517 Cardiomegaly: Secondary | ICD-10-CM | POA: Diagnosis not present

## 2020-04-23 DIAGNOSIS — I5032 Chronic diastolic (congestive) heart failure: Secondary | ICD-10-CM | POA: Insufficient documentation

## 2020-04-23 HISTORY — DX: Heart failure, unspecified: I50.9

## 2020-04-23 LAB — CBC WITH DIFFERENTIAL/PLATELET
Abs Immature Granulocytes: 0.12 10*3/uL — ABNORMAL HIGH (ref 0.00–0.07)
Basophils Absolute: 0.1 10*3/uL (ref 0.0–0.1)
Basophils Relative: 1 %
Eosinophils Absolute: 0.5 10*3/uL (ref 0.0–0.5)
Eosinophils Relative: 4 %
HCT: 41.6 % (ref 36.0–46.0)
Hemoglobin: 13.5 g/dL (ref 12.0–15.0)
Immature Granulocytes: 1 %
Lymphocytes Relative: 32 %
Lymphs Abs: 3.8 10*3/uL (ref 0.7–4.0)
MCH: 28.1 pg (ref 26.0–34.0)
MCHC: 32.5 g/dL (ref 30.0–36.0)
MCV: 86.7 fL (ref 80.0–100.0)
Monocytes Absolute: 0.6 10*3/uL (ref 0.1–1.0)
Monocytes Relative: 5 %
Neutro Abs: 6.8 10*3/uL (ref 1.7–7.7)
Neutrophils Relative %: 57 %
Platelets: 280 10*3/uL (ref 150–400)
RBC: 4.8 MIL/uL (ref 3.87–5.11)
RDW: 14.8 % (ref 11.5–15.5)
WBC: 11.8 10*3/uL — ABNORMAL HIGH (ref 4.0–10.5)
nRBC: 0 % (ref 0.0–0.2)

## 2020-04-23 LAB — URINALYSIS, ROUTINE W REFLEX MICROSCOPIC
Bilirubin Urine: NEGATIVE
Glucose, UA: 250 mg/dL — AB
Ketones, ur: NEGATIVE mg/dL
Leukocytes,Ua: NEGATIVE
Nitrite: NEGATIVE
Protein, ur: 100 mg/dL — AB
Specific Gravity, Urine: 1.01 (ref 1.005–1.030)
pH: 6.5 (ref 5.0–8.0)

## 2020-04-23 LAB — COMPREHENSIVE METABOLIC PANEL
ALT: 10 U/L (ref 0–44)
AST: 22 U/L (ref 15–41)
Albumin: 3.7 g/dL (ref 3.5–5.0)
Alkaline Phosphatase: 65 U/L (ref 38–126)
Anion gap: 10 (ref 5–15)
BUN: 24 mg/dL — ABNORMAL HIGH (ref 8–23)
CO2: 22 mmol/L (ref 22–32)
Calcium: 9.9 mg/dL (ref 8.9–10.3)
Chloride: 105 mmol/L (ref 98–111)
Creatinine, Ser: 1.52 mg/dL — ABNORMAL HIGH (ref 0.44–1.00)
GFR, Estimated: 35 mL/min — ABNORMAL LOW (ref >60)
Glucose, Bld: 180 mg/dL — ABNORMAL HIGH (ref 70–99)
Potassium: 3.6 mmol/L (ref 3.5–5.1)
Sodium: 137 mmol/L (ref 135–145)
Total Bilirubin: 0.2 mg/dL — ABNORMAL LOW (ref 0.3–1.2)
Total Protein: 7 g/dL (ref 6.5–8.1)

## 2020-04-23 LAB — URINALYSIS, MICROSCOPIC (REFLEX): Bacteria, UA: NONE SEEN

## 2020-04-23 MED ORDER — IPRATROPIUM BROMIDE 0.02 % IN SOLN
0.5000 mg | Freq: Once | RESPIRATORY_TRACT | Status: AC
  Administered 2020-04-23: 0.5 mg via RESPIRATORY_TRACT
  Filled 2020-04-23: qty 2.5

## 2020-04-23 MED ORDER — ALBUTEROL SULFATE (2.5 MG/3ML) 0.083% IN NEBU
5.0000 mg | INHALATION_SOLUTION | Freq: Once | RESPIRATORY_TRACT | Status: AC
  Administered 2020-04-23: 5 mg via RESPIRATORY_TRACT
  Filled 2020-04-23: qty 6

## 2020-04-23 NOTE — ED Provider Notes (Signed)
[facility] EMERGENCY DEPARTMENT Provider Note   CSN: [id number] Arrival date & time: 04/23/20  0947     History Chief Complaint  Patient presents with  . Dizziness    Abigail Wallace is a 75 y.o. female.  Patient with history of CHF, CKD, schizophrenia, atrial fibrillation, COPD, DM, HTN, CAD, lives at home under the care of her son who is at bedside, presents for evaluation of cough, sob and congestion for the past 2 weeks. No known fever. She hasn't complained of pain. Per her son, she has had a lot of wheezing that is not as controlled as usual with her nebulizer. She reports dizziness also for 2 weeks, and increased dependent swelling. No vomiting, diarrhea or urinary symptoms.   The history is provided by the patient and a relative. No language interpreter was used.       Past Medical History:  Diagnosis Date  . Abdominal wall hernia 01/2017  . Atrial fibrillation ([place])   . CKD (chronic kidney disease)   . COPD (chronic obstructive pulmonary disease) ([place])   . Coronary artery disease    NON-CRITICAL  . Ectasis aorta ([place])   . GERD (gastroesophageal reflux disease)   . Gout   . Hypertension   . Memory difficulties 12/02/2013  . Psychosis ([place])   . Tardive dyskinesia 12/02/2013  . Type 2 diabetes mellitus [facility])     Patient Active Problem List   Diagnosis Date Noted  . Chronic back pain 11/14/2018  . Pulmonary nodules 03/03/2018  . Morbid obesity ([place]) 02/07/2018  . Paroxysmal atrial fibrillation ([place]) 01/28/2018  . Dependence on wheelchair 05/17/2017  . Abdominal aortic ectasia ([place]) 01/16/2017  . Renal cyst 01/16/2017  . Long-term use of high-risk medication 10/11/2016  . CKD stage IIIb 09/12/2016  . Tobacco use disorder 05/10/2014  . Essential hypertension 04/22/2013  . Well controlled type 2 diabetes mellitus ([place]) 03/20/2012  . Gout 09/26/2011  . Pacemaker 03/30/2010  . Vaginal prolapse 03/27/2010  . Sleep apnea 03/27/2010  . Osteoarthritis  of right knee 03/27/2010  . Resting tremor 12/15/2009  . Chronic diastolic heart failure ([place]) 09/30/2009  . Sick sinus syndrome ([place]) 04/24/2006  . Mixed hyperlipidemia 03/14/2006  . Paranoid schizophrenia ([place]) 03/14/2006  . COPD 03/14/2006    Past Surgical History:  Procedure Laterality Date  . CARDIAC CATHETERIZATION  2007   Non-critical.   . CHOLECYSTECTOMY    . ELBOW SURGERY    . EP IMPLANTABLE DEVICE N/A 08/31/2014   Procedure: PPM Generator Changeout;  Surgeon: Sanda Klein, MD;  Location: [facility];  Service: Cardiovascular;  Laterality: N/A;  . INSERT / REPLACE / REMOVE PACEMAKER  2007   [place]/SYMPTOMATIC HEART BLOCK  . PACEMAKER PLACEMENT  09/28/2005   2/2 SSS?  . Persantine stress test  05/01/2010   EF 66%. Normal LV sys fx. Unchanged from previous studies.   . TRANSTHORACIC ECHOCARDIOGRAM  09/08/10   SEVERE CONCENTRIC HYPERTROPHY.LV FUNCTION WAS VIGOROUS.EF 65%-70%.VENTRICULAR SEPTUM-INCOORDINATE MOTION.LEFT ATRIUM-MILDLY DILATED.TRIVIAL TR.  . TUBAL LIGATION       OB History   No obstetric history on file.     Family History  Problem Relation Age of Onset  . Heart disease Mother   . Heart disease Father   . Stroke Sister   . Diabetes type II Son   . Colon cancer Neg Hx     Social History   Tobacco Use  . Smoking status: Current Every Day Smoker    Packs/day: 0.25  Types: Cigarettes  . Smokeless tobacco: Never Used  . Tobacco comment: 6 cigs a day  Vaping Use  . Vaping Use: Some days  Substance Use Topics  . Alcohol use: No    Alcohol/week: 0.0 standard drinks  . Drug use: No    Home Medications Prior to Admission medications   Medication Sig Start Date End Date Taking? Authorizing Provider  acetaminophen (TYLENOL) 500 MG tablet Take 1,000 mg by mouth every 6 (six) hours as needed for moderate pain.     [provider]  albuterol (PROVENTIL) (2.5 MG/3ML) 0.083% nebulizer solution Take 3 mLs (2.5 mg total) by  nebulization every 6 (six) hours as needed for wheezing or shortness of breath. 05/13/19   Martyn Malay, MD  albuterol (VENTOLIN HFA) 108 (90 Base) MCG/ACT inhaler Inhale 2 puffs into the lungs every 6 (six) hours as needed for wheezing or shortness of breath. 10/08/19   Martyn Malay, MD  allopurinol (ZYLOPRIM) 100 MG tablet Take 1 tablet (100 mg total) by mouth daily. 05/29/19   Martyn Malay, MD  amLODipine (NORVASC) 10 MG tablet Take 1 tablet (10 mg total) by mouth daily. 05/29/19   Martyn Malay, MD  Blood Glucose Monitoring Suppl [facility] VERIO) w/Device KIT Check blood sugar once daily 07/06/19   Martyn Malay, MD  cholecalciferol (VITAMIN D3) 25 MCG (1000 UNIT) tablet Take 1,000 Units by mouth daily.    [provider]  diclofenac Sodium (VOLTAREN) 1 % GEL Apply 2 g topically 4 (four) times daily as needed (pain). 03/11/20   Martyn Malay, MD  divalproex (DEPAKOTE ER) 250 MG 24 hr tablet Take 250 mg by mouth at bedtime.  02/11/18   [provider]  ELIQUIS 5 MG TABS tablet TAKE ONE TABLET BY MOUTH TWICE A DAY 01/04/20   Croitoru, Mihai, MD  Fluticasone-Umeclidin-Vilant (TRELEGY ELLIPTA) 100-62.5-25 MCG/INH AEPB One inhalation daily Patient not taking: Reported on 03/11/2020 12/09/19   Martyn Malay, MD  glucose blood [facility] VERIO) test strip Use as instructed 06/30/19   Martyn Malay, MD  glucose blood test strip Test each morning before breakfast 07/15/19   Martyn Malay, MD  Incontinence Supplies MISC 1 Units by Does not apply route as needed. 12/13/15   McKeag, Marylynn Pearson, MD  Lancet Device MISC 1 Device by Does not apply route 3 (three) times a week. 07/15/19   Martyn Malay, MD  linagliptin (TRADJENTA) 5 MG TABS tablet Take 1 tablet (5 mg total) by mouth daily. 02/23/20   Martyn Malay, MD  metoprolol tartrate (LOPRESSOR) 50 MG tablet Take 2 tablets (100 mg total) by mouth 2 (two) times daily. 09/25/19   Martyn Malay, MD  mupirocin ointment (BACTROBAN) 2 %  Apply 1 application topically 2 (two) times daily. To affected area Patient not taking: Reported on 03/11/2020 07/14/19   Martyn Malay, MD  nitroGLYCERIN (NITROSTAT) 0.4 MG SL tablet PLACE 1 TABLET (0.4 MG TOTAL) UNDER THE TONGUE EVERY 5 MINUTES FOR THREE DOSES AS NEEDED. FOR CHEST PAIN CALL 911 AFTER THAT Patient not taking: Reported on 03/11/2020 11/11/18   Croitoru, Mihai, MD  nystatin cream (MYCOSTATIN) Apply 1 application topically 2 (two) times daily as needed for dry skin. Use in skin folds for rash 07/14/19   Martyn Malay, MD  OLANZapine (ZYPREXA) 10 MG tablet Take 10 mg by mouth at bedtime. 06/08/14   [provider]  OneTouch Delica Lancets 41C MISC Use to test once daily.  DX code:E11.9 06/30/19   Martyn Malay, MD  pravastatin (PRAVACHOL) 40 MG tablet TAKE ONE TABLET BY MOUTH EVERY EVENING 02/03/20   Croitoru, Dani Gobble, MD  senna (SENOKOT) 8.6 MG TABS tablet Take 1 tablet (8.6 mg total) by mouth daily as needed for mild constipation. Patient not taking: Reported on 03/11/2020 11/17/19   Martyn Malay, MD  sodium bicarbonate 650 MG tablet Take 1 tablet (650 mg total) by mouth 2 (two) times daily. 07/09/19   Martyn Malay, MD    Allergies    Abilify [aripiprazole], Clozapine, Benadryl [diphenhydramine hcl], Cogentin [benztropine], Haloperidol lactate, Latuda [lurasidone hcl], Remeron [mirtazapine], Keflex [cephalexin], Codeine, and Latex  Review of Systems   Review of Systems  Constitutional: Positive for chills. Negative for fever.  HENT: Positive for congestion.   Respiratory: Positive for cough, shortness of breath and wheezing.   Cardiovascular: Positive for leg swelling. Negative for chest pain.  Gastrointestinal: Negative.  Negative for abdominal pain, diarrhea and vomiting.  Genitourinary: Negative.  Negative for dysuria.  Musculoskeletal: Negative.  Negative for myalgias.  Skin: Negative.  Negative for color change and rash.  Neurological: Positive for dizziness.  Negative for syncope, weakness and headaches.  Psychiatric/Behavioral: Negative for confusion.    Physical Exam Updated Vital Signs BP (!) 155/74 (BP Location: Right Arm)   Pulse 72   Temp 98.3 F (36.8 C) (Oral)   Resp 20   SpO2 99%   Physical Exam Vitals and nursing note reviewed.  Constitutional:      General: She is not in acute distress.    Appearance: She is well-developed. She is obese.  HENT:     Head: Normocephalic.     Nose: Nose normal.     Mouth/Throat:     Mouth: Mucous membranes are moist.  Eyes:     Conjunctiva/sclera: Conjunctivae normal.  Cardiovascular:     Rate and Rhythm: Normal rate and regular rhythm.     Heart sounds: No murmur heard.   Pulmonary:     Effort: Pulmonary effort is normal. No respiratory distress.     Breath sounds: Wheezing and rales present.  Chest:     Chest wall: No tenderness.  Abdominal:     General: Bowel sounds are normal.     Palpations: Abdomen is soft.     Tenderness: There is no abdominal tenderness. There is no guarding or rebound.  Musculoskeletal:        General: Normal range of motion.     Cervical back: Normal range of motion and neck supple.     Right lower leg: Edema present.     Left lower leg: Edema present.  Skin:    General: Skin is warm and dry.     Findings: No rash.  Neurological:     Mental Status: She is alert and oriented to person, place, and time.     ED Results / Procedures / Treatments   Labs (all labs ordered are listed, but only abnormal results are displayed) Labs Reviewed - No data to display  EKG None  Radiology No results found.  Procedures Procedures   Medications Ordered in ED Medications - No data to display  ED Course  I have reviewed the triage vital signs and the nursing notes.  Pertinent labs & imaging results that were available during my care of the patient were reviewed by me and considered in my medical decision making (see chart for details).    MDM  Rules/Calculators/A&P  Patient to ED with caregiver (son) with ss/sxs as per HPI.   The patient is nontoxic in appearance. VSS. Has active cough and wheezing on exam. No hypoxia.   CXR without edema or infiltrates. Labs are essentially baseline. No evidence infection, acute fluid overload, respiratory compromise. Nebulized treatment with Albuterol and Atrovent provided with improvement in cough and wheezing. COVID/influenza pending.   She is felt appropriate for discharge home. She has been seen by Dr. Tyrone Nine. Patient and family provided results of labs and imaging and are comfortable with plan of discharge.  Final Clinical Impression(s) / ED Diagnoses Final diagnoses:  None   1. COPD  Rx / DC Orders ED Discharge Orders    None       Charlann Lange, PA-C 04/23/20 Snyder, [place], DO 04/23/20 1429

## 2020-04-23 NOTE — Discharge Instructions (Addendum)
Continue your regular medications, including regular nebulized Albuterol for cough and wheezing.   Follow up with your doctor for recheck in 2-3 days. If symptoms change or worsen, please return to the emergency department for further evaluation.

## 2020-04-23 NOTE — ED Triage Notes (Signed)
Pt has had cough/congestion for a week, also reports dizziness. Lower extremeties more swollen today. Pt reports short of breath.

## 2020-04-24 ENCOUNTER — Encounter: Payer: Self-pay | Admitting: Family Medicine

## 2020-04-24 LAB — URINE CULTURE

## 2020-04-24 LAB — SARS CORONAVIRUS 2 (TAT 6-24 HRS): SARS Coronavirus 2: NEGATIVE

## 2020-04-25 ENCOUNTER — Ambulatory Visit: Payer: Medicare Other | Admitting: Family Medicine

## 2020-04-25 ENCOUNTER — Other Ambulatory Visit: Payer: Self-pay

## 2020-04-25 DIAGNOSIS — J42 Unspecified chronic bronchitis: Secondary | ICD-10-CM

## 2020-04-25 MED ORDER — ALBUTEROL SULFATE (2.5 MG/3ML) 0.083% IN NEBU: 2.5000 mg | INHALATION_SOLUTION | Freq: Four times a day (QID) | RESPIRATORY_TRACT | 6 refills | Status: DC | PRN

## 2020-04-25 NOTE — Telephone Encounter (Signed)
Patient's son calls nurse line regarding request for albuterol solution. Son also is requesting that new order is placed for DME nebulizer supplies (tubing and mouth piece).   To PCP  Talbot Grumbling, RN

## 2020-04-26 ENCOUNTER — Telehealth: Payer: Self-pay | Admitting: Family Medicine

## 2020-04-26 NOTE — Telephone Encounter (Signed)
Left generic voicemail for son, [name], to check in after ED visit.  [name], MD  Family Medicine Teaching Service

## 2020-04-27 NOTE — Telephone Encounter (Signed)
Called and spoke with patient. She is doing okay--breathing is 'usual' and denies other symptoms. Reviewed reasons to call and return to care.  [name], MD  Family Medicine Teaching Service

## 2020-05-03 ENCOUNTER — Telehealth: Payer: Self-pay | Admitting: Family Medicine

## 2020-05-03 ENCOUNTER — Encounter: Payer: Self-pay | Admitting: Family Medicine

## 2020-05-03 NOTE — Telephone Encounter (Signed)
Called and spoke with patient--asked her to elevate legs, can do routine, will call back if symptoms change. Will need appointment if true concern for cellulitis/symptoms change(see mychart documentation).   [name], MD  Family Medicine Teaching Service

## 2020-05-03 NOTE — Telephone Encounter (Signed)
Patient is calling and would like to speak with Dr. [name]. She called and asked if she could have medication sent in for her foot that is swelling and possibly infected. I informed patient she would need an appointment before being prescribed medications.   The patients son said in the background "there is nothing wrong with her foot it is just swollen" the patient then said she would not have a ride and asked that Dr. [name] call her to discuss.   The best call back is [phone].

## 2020-05-23 ENCOUNTER — Ambulatory Visit (HOSPITAL_COMMUNITY)
Admission: RE | Admit: 2020-05-23 | Discharge: 2020-05-23 | Disposition: A | Discharge status: Home / Self Care | Payer: Medicare Other | Source: Ambulatory Visit | Attending: Internal Medicine | Admitting: Internal Medicine

## 2020-05-23 ENCOUNTER — Other Ambulatory Visit: Payer: Self-pay

## 2020-05-23 ENCOUNTER — Ambulatory Visit (INDEPENDENT_AMBULATORY_CARE_PROVIDER_SITE_OTHER): Payer: Medicare Other | Admitting: Cardiovascular Disease

## 2020-05-23 ENCOUNTER — Other Ambulatory Visit (HOSPITAL_COMMUNITY): Payer: Self-pay | Admitting: Cardiovascular Disease

## 2020-05-23 ENCOUNTER — Encounter: Payer: Self-pay | Admitting: Cardiovascular Disease

## 2020-05-23 VITALS — BP 151/81 | HR 72 | Ht 66.0 in | Wt 259.0 lb

## 2020-05-23 DIAGNOSIS — I480 Paroxysmal atrial fibrillation: Secondary | ICD-10-CM

## 2020-05-23 DIAGNOSIS — I714 Abdominal aortic aneurysm, without rupture, unspecified: Secondary | ICD-10-CM

## 2020-05-23 DIAGNOSIS — E119 Type 2 diabetes mellitus without complications: Secondary | ICD-10-CM

## 2020-05-23 DIAGNOSIS — G4733 Obstructive sleep apnea (adult) (pediatric): Secondary | ICD-10-CM | POA: Diagnosis not present

## 2020-05-23 DIAGNOSIS — I495 Sick sinus syndrome: Secondary | ICD-10-CM

## 2020-05-23 DIAGNOSIS — Z950 Presence of cardiac pacemaker: Secondary | ICD-10-CM | POA: Diagnosis not present

## 2020-05-23 DIAGNOSIS — E782 Mixed hyperlipidemia: Secondary | ICD-10-CM | POA: Diagnosis not present

## 2020-05-23 DIAGNOSIS — I5032 Chronic diastolic (congestive) heart failure: Secondary | ICD-10-CM

## 2020-05-23 DIAGNOSIS — I442 Atrioventricular block, complete: Secondary | ICD-10-CM

## 2020-05-23 DIAGNOSIS — I10 Essential (primary) hypertension: Secondary | ICD-10-CM

## 2020-05-23 NOTE — Progress Notes (Signed)
Patient ID: Abigail Wallace, female   DOB: [birth year], 75 y.o.   MRN: [id number]    Cardiology Office Note    Date:  05/23/2020   ID:  [place], [place] [birth year], MRN [id number]  PCP:  Martyn Malay, MD  Cardiologist:   Sanda Klein, MD   Chief Complaint  Patient presents with  . Pacemaker Check    History of Present Illness:  Abigail Wallace is a 75 y.o. female with complete heart block and sinus node dysfunction who presents for atrial fibrillation and a pacemaker check. As always, her son Abigail Wallace accompanies her today.  Other significant medical problems include obstructive sleep apnea, morbid obesity, AAA, mixed hyperlipidemia and history of schizophrenia.  She is extremely sedentary.  No serious complaints.  Biggest issue is lower extremity edema that worsens because she sits in a chair with the legs dangling all the time.  She cannot keep the legs elevated due to hip pain.  Denies angina at rest or with light activity, no problems with dyspnea or wheezing.  Has furosemide "as needed" for periods of severe edema.  Son is careful to cook without added salt.  Palpitations, dizziness or syncope.  She had a follow-up appointment with Dr. Posey Pronto on 04/04/2020.  Her blood pressure then was 142/70 and a creatinine was 1.38.  Due to her issues with orthostatic hypotension he decided not to make any further adjustments in her antihypertensive medications.  She came back today for a AAA duplex ultrasound.  The aneurysm is stable at approximately 3.4 cm in diameter.   She has a small infrarenal abdominal aortic aneurysm that was measured with a diameter of 3.4 cm.  This area of the aorta measured 2.9 cm on CT of the abdomen.  Able to fully quit smoking, keeps it less than 5 cigarettes a day.  She remains morbidly obese with a BMI of 42.  Her most recent hemoglobin A1c was not bad at 6.2% and her LDL was 80.  Triglycerides also elevated.  She does not have known CAD or PAD.  Her pacemaker is a  dual-chamber Medtronic Adapta device implanted in August 2016 as a generator change out, leads from 2007. She is pacemaker dependent due to complete heart block (she has an idioventricular escape rhythm around 30 bpm). She also has significant sinus bradycardia.  There is 74% atrial pacing (with good heart rate histograms) and 100% ventricular pacing.  There have been only 2 episodes of atrial fibrillation since her last office visit each 1 lasting about a minute on August 2 and April 9.  She has had her usual pattern of 1 or 2 episodes of nonsustained VT monthly.  There was a particularly long run in December lasting for 12 minutes, more recently the episodes have been 8-10 beats long.  They are consistently asymptomatic.  She initially received a dual-chamber permanent pacemaker in 2007 for symptomatic AV block. She has minimal coronary atherosclerosis by cardiac catheterization performed in 2007 and no evidence of insufficiency by nuclear perfusion testing in 2012. By echocardiography she has normal left ventricular size and systolic function and no major structural cardiac abnormalities.    Past Medical History:  Diagnosis Date  . Abdominal wall hernia 01/2017  . Atrial fibrillation ([place])   . CHF (congestive heart failure) ([place])   . CKD (chronic kidney disease)   . COPD (chronic obstructive pulmonary disease) ([place])   . Coronary artery disease    NON-CRITICAL  . Ectasis aorta ([place])   .  GERD (gastroesophageal reflux disease)   . Gout   . Hypertension   . Memory difficulties 12/02/2013  . Psychosis ([place])   . Tardive dyskinesia 12/02/2013  . Type 2 diabetes mellitus ([place])     Past Surgical History:  Procedure Laterality Date  . CARDIAC CATHETERIZATION  2007   Non-critical.   . CHOLECYSTECTOMY    . ELBOW SURGERY    . EP IMPLANTABLE DEVICE N/A 08/31/2014   Procedure: PPM Generator Changeout;  Surgeon: Sanda Klein, MD;  Location: [facility];  Service: Cardiovascular;   Laterality: N/A;  . INSERT / REPLACE / REMOVE PACEMAKER  2007   [place]/SYMPTOMATIC HEART BLOCK  . PACEMAKER PLACEMENT  09/28/2005   2/2 SSS?  . Persantine stress test  05/01/2010   EF 66%. Normal LV sys fx. Unchanged from previous studies.   . TRANSTHORACIC ECHOCARDIOGRAM  09/08/10   SEVERE CONCENTRIC HYPERTROPHY.LV FUNCTION WAS VIGOROUS.EF 65%-70%.VENTRICULAR SEPTUM-INCOORDINATE MOTION.LEFT ATRIUM-MILDLY DILATED.TRIVIAL TR.  . TUBAL LIGATION      Outpatient Medications Prior to Visit  Medication Sig Dispense Refill  . acetaminophen (TYLENOL) 500 MG tablet Take 1,000 mg by mouth every 6 (six) hours as needed for moderate pain.     Marland Kitchen albuterol (PROVENTIL) (2.5 MG/3ML) 0.083% nebulizer solution Take 3 mLs (2.5 mg total) by nebulization every 6 (six) hours as needed for wheezing or shortness of breath. 150 mL 6  . albuterol (VENTOLIN HFA) 108 (90 Base) MCG/ACT inhaler Inhale 2 puffs into the lungs every 6 (six) hours as needed for wheezing or shortness of breath. 18 g 3  . allopurinol (ZYLOPRIM) 100 MG tablet Take 1 tablet (100 mg total) by mouth daily. 90 tablet 3  . amLODipine (NORVASC) 10 MG tablet Take 1 tablet (10 mg total) by mouth daily. 90 tablet 3  . Blood Glucose Monitoring Suppl (ONETOUCH VERIO) w/Device KIT Check blood sugar once daily 1 kit 3  . cholecalciferol (VITAMIN D3) 25 MCG (1000 UNIT) tablet Take 1,000 Units by mouth daily.    . diclofenac Sodium (VOLTAREN) 1 % GEL Apply 2 g topically 4 (four) times daily as needed (pain). 350 g 2  . divalproex (DEPAKOTE ER) 250 MG 24 hr tablet Take 250 mg by mouth at bedtime.     Marland Kitchen ELIQUIS 5 MG TABS tablet TAKE ONE TABLET BY MOUTH TWICE A DAY 180 tablet 1  . glucose blood (ONETOUCH VERIO) test strip Use as instructed 100 each 12  . glucose blood test strip Test each morning before breakfast 100 each 12  . Incontinence Supplies MISC 1 Units by Does not apply route as needed. 100 each prn  . Lancet Device MISC 1 Device by Does not apply  route 3 (three) times a week. 1 each 11  . linagliptin (TRADJENTA) 5 MG TABS tablet Take 1 tablet (5 mg total) by mouth daily. 90 tablet 3  . metoprolol tartrate (LOPRESSOR) 50 MG tablet Take 2 tablets (100 mg total) by mouth 2 (two) times daily. 360 tablet 2  . mupirocin ointment (BACTROBAN) 2 % Apply 1 application topically 2 (two) times daily. To affected area 22 g 0  . nitroGLYCERIN (NITROSTAT) 0.4 MG SL tablet PLACE 1 TABLET (0.4 MG TOTAL) UNDER THE TONGUE EVERY 5 MINUTES FOR THREE DOSES AS NEEDED. FOR CHEST PAIN CALL 911 AFTER THAT 25 tablet 0  . nystatin cream (MYCOSTATIN) Apply 1 application topically 2 (two) times daily as needed for dry skin. Use in skin folds for rash 30 g 3  . OLANZapine (ZYPREXA)  10 MG tablet Take 10 mg by mouth at bedtime.  0  . OneTouch Delica Lancets 63F MISC Use to test once daily.  DX code:E11.9 100 each 3  . pravastatin (PRAVACHOL) 40 MG tablet TAKE ONE TABLET BY MOUTH EVERY EVENING 90 tablet 0  . senna (SENOKOT) 8.6 MG TABS tablet Take 1 tablet (8.6 mg total) by mouth daily as needed for mild constipation. 30 tablet 0  . sodium bicarbonate 650 MG tablet Take 1 tablet (650 mg total) by mouth 2 (two) times daily. 180 tablet 3  . Fluticasone-Umeclidin-Vilant (TRELEGY ELLIPTA) 100-62.5-25 MCG/INH AEPB One inhalation daily (Patient not taking: Reported on 03/11/2020) 28 each 3   No facility-administered medications prior to visit.     Allergies:   Abilify [aripiprazole], Clozapine, Benadryl [diphenhydramine hcl], Cogentin [benztropine], Haloperidol lactate, Latuda [lurasidone hcl], Remeron [mirtazapine], Keflex [cephalexin], Codeine, and Latex   Social History   Socioeconomic History  . Marital status: Divorced    Spouse name: Not on file  . Number of children: Not on file  . Years of education: Not on file  . Highest education level: Not on file  Occupational History  . Not on file  Tobacco Use  . Smoking status: Current Every Day Smoker    Packs/day:  0.25    Types: Cigarettes  . Smokeless tobacco: Never Used  . Tobacco comment: 6 cigs a day  Vaping Use  . Vaping Use: Some days  Substance and Sexual Activity  . Alcohol use: No    Alcohol/week: 0.0 standard drinks  . Drug use: No  . Sexual activity: Not on file  Other Topics Concern  . Not on file  Social History Narrative   Lives alone. Son brings to appointments ([name])    Children: 2 daughters, 1 son; 32 GC, 5 GGC.    Occupation: disabled.    Caffeine:   Tobacco: 5-6/day   Denies alcohol.    Social Determinants of Health   Financial Resource Strain: Not on file  Food Insecurity: Not on file  Transportation Needs: Not on file  Physical Activity: Not on file  Stress: Not on file  Social Connections: Not on file     Family History:  The patient's family history includes Diabetes type II in her son; Heart disease in her father and mother; Stroke in her sister.   ROS:   Please see the history of present illness.    All other systems reviewed and are negative.   PHYSICAL EXAM:   VS:  BP (!) 151/81   Pulse 72   Ht _0  (1.676 m)   Wt 259 lb (117.5 kg)   SpO2 97%   BMI 41.80 kg/m     General: Alert, oriented x3, no distress, morbidly obese.  Healthy pacemaker site. Head: no evidence of trauma, PERRL, EOMI, no exophtalmos or lid lag, no myxedema, no xanthelasma; normal ears, nose and oropharynx Neck: normal jugular venous pulsations and no hepatojugular reflux; brisk carotid pulses without delay and no carotid bruits Chest: clear to auscultation, no signs of consolidation by percussion or palpation, normal fremitus, symmetrical and full respiratory excursions Cardiovascular: normal position and quality of the apical impulse, regular rhythm, normal first and second heart sounds, no murmurs, rubs or gallops Abdomen: no tenderness or distention, no masses by palpation, no abnormal pulsatility or arterial bruits, normal bowel sounds, no hepatosplenomegaly Extremities: no  clubbing, cyanosis or edema; 2+ radial, ulnar and brachial pulses bilaterally; 2+ right femoral, posterior tibial and dorsalis pedis pulses; 2+  left femoral, posterior tibial and dorsalis pedis pulses; no subclavian or femoral bruits Neurological: grossly nonfocal Psych: Normal mood and affect  Wt Readings from Last 3 Encounters:  05/23/20 259 lb (117.5 kg)  04/23/20 264 lb (119.7 kg)  03/11/20 261 lb 3.2 oz (118.5 kg)    Studies/Labs Reviewed:   EKG:  EKG is ordered today.  It shows AV sequential pacing with a prolonged AV delay at 210 ms  Recent Labs: 06/25/2019: B Natriuretic Peptide 147.6 04/23/2020: ALT 10; BUN 24; Creatinine, Ser 1.52; Hemoglobin 13.5; Platelets 280; Potassium 3.6; Sodium 137  Lipid Panel     Component Value Date/Time   CHOL 170 04/20/2019 1209   TRIG 320 (H) 04/20/2019 1209   HDL 38 (L) 04/20/2019 1209   CHOLHDL 4.5 (H) 04/20/2019 1209   CHOLHDL 2.7 11/19/2010 0605   VLDL 15 11/19/2010 0605   LDLCALC 80 04/20/2019 1209   LDLDIRECT 80 03/20/2012 1510     ASSESSMENT:    1. Paroxysmal atrial fibrillation (HCC)   2. Chronic diastolic heart failure ([place])   3. Essential hypertension   4. Sick sinus syndrome ([place])   5. CHB (complete heart block) (HCC)   6. Pacemaker   7. Mixed hyperlipidemia   8. AAA (abdominal aortic aneurysm) without rupture ([place])   9. Morbid obesity ([place])   10. Well controlled type 2 diabetes mellitus ([place])   11. OSA (obstructive sleep apnea)      PLAN:  In order of problems listed above:  1. Afib: The overall burden of atrial fibrillation remains extremely low and recently the episodes have been extremely brief, but in the past she has had lengthy episodes lasting for hours.  Anticoagulation remains indicated.  CHADSVasc 6-7 (age 40, gender, DM, HTN, HF, +/-PAD). 2. CHF: Clinically euvolemic today; functional status is very difficult to assess since she is extremely sedentary.  Avoid excessive diuresis due to renal  dysfunction. 3. HTN: On maximum dose amlodipine and we have had to decrease her beta-blocker dose due to side effects.  History of intolerance to ARB.  Avoiding diuretics due to renal dysfunction.  Not sure have achieved respond to clonidine considering her multiple psychiatric issues.  We decided not to make any changes to her medications today. 4. SSS: Heart rate histograms appropriate with current sensor settings, especially considering her sedentary lifestyle. 5. CHB: Although she frequently has a slow idioventricular escape rhythm, this cannot be reliable and she should be considered pacemaker dependent. 6. PPM: Continue remote downloads every 3 months 7. HLP: Ideally would like to have her LDL less than 70 since she does have a AAA, although she does not have any other features of PAD or CAD.  Due for repeat lipid profile.  Not fasting today. 8. AAA: Stable on yearly ultrasound.  We will repeat just before her next appointment in a year 9. Obesity: She's lost 5 more pounds since last year, but remains in the morbidly obese range. 10. DM: Well-controlled 11. OSA: Reports compliance with CPAP and denies daytime hypersomnolence.   Medication Adjustments/Labs and Tests Ordered: Current medicines are reviewed at length with the patient today.  Concerns regarding medicines are outlined above.  Medication changes, Labs and Tests ordered today are listed in the Patient Instructions below. Patient Instructions  Medication Instructions:  No changes *If you need a refill on your cardiac medications before your next appointment, please call your pharmacy*   Lab Work: None ordered If you have labs (blood work) drawn today and your tests are  completely normal, you will receive your results only by: Marland Kitchen MyChart Message (if you have MyChart) OR . A paper copy in the mail If you have any lab test that is abnormal or we need to change your treatment, we will call you to review the  results.   Testing/Procedures: None ordered   Follow-Up: At [facility], you and your health needs are our priority.  As part of our continuing mission to provide you with exceptional heart care, we have created designated Provider Care Teams.  These Care Teams include your primary Cardiologist (physician) and Advanced Practice Providers (APPs -  Physician Assistants and Nurse Practitioners) who all work together to provide you with the care you need, when you need it.  We recommend signing up for the patient portal called "MyChart".  Sign up information is provided on this After Visit Summary.  MyChart is used to connect with patients for Virtual Visits (Telemedicine).  Patients are able to view lab/test results, encounter notes, upcoming appointments, etc.  Non-urgent messages can be sent to your provider as well.   To learn more about what you can do with MyChart, go to [email or web address].    Your next appointment:   12 month(s)  The format for your next appointment:   In Person  Provider:   Sanda Klein, MD          Signed, Sanda Klein, MD  05/23/2020 2:11 PM    [address], [place], [place]  [address] Phone: [phone]; Fax: [phone]

## 2020-05-23 NOTE — Addendum Note (Signed)
Addended by: Sanda Klein on: 05/23/2020 02:22 PM   Modules accepted: Orders

## 2020-05-23 NOTE — Patient Instructions (Signed)

## 2020-05-30 ENCOUNTER — Ambulatory Visit (INDEPENDENT_AMBULATORY_CARE_PROVIDER_SITE_OTHER): Payer: Medicare Other | Admitting: Family Medicine

## 2020-05-30 ENCOUNTER — Other Ambulatory Visit: Payer: Self-pay

## 2020-05-30 ENCOUNTER — Encounter: Payer: Self-pay | Admitting: Family Medicine

## 2020-05-30 VITALS — BP 145/70 | HR 72

## 2020-05-30 DIAGNOSIS — Z79899 Other long term (current) drug therapy: Secondary | ICD-10-CM

## 2020-05-30 DIAGNOSIS — E119 Type 2 diabetes mellitus without complications: Secondary | ICD-10-CM

## 2020-05-30 DIAGNOSIS — M79671 Pain in right foot: Secondary | ICD-10-CM | POA: Diagnosis not present

## 2020-05-30 DIAGNOSIS — D72829 Elevated white blood cell count, unspecified: Secondary | ICD-10-CM | POA: Diagnosis not present

## 2020-05-30 DIAGNOSIS — N1832 Chronic kidney disease, stage 3b: Secondary | ICD-10-CM | POA: Diagnosis not present

## 2020-05-30 DIAGNOSIS — J42 Unspecified chronic bronchitis: Secondary | ICD-10-CM | POA: Diagnosis not present

## 2020-05-30 DIAGNOSIS — R053 Chronic cough: Secondary | ICD-10-CM | POA: Diagnosis not present

## 2020-05-30 DIAGNOSIS — I10 Essential (primary) hypertension: Secondary | ICD-10-CM

## 2020-05-30 DIAGNOSIS — F200 Paranoid schizophrenia: Secondary | ICD-10-CM

## 2020-05-30 LAB — POCT GLYCOSYLATED HEMOGLOBIN (HGB A1C): HbA1c, POC (controlled diabetic range): 6.9 % (ref 0.0–7.0)

## 2020-05-30 MED ORDER — ALBUTEROL SULFATE (2.5 MG/3ML) 0.083% IN NEBU: 2.5000 mg | INHALATION_SOLUTION | Freq: Four times a day (QID) | RESPIRATORY_TRACT | 6 refills | Status: DC | PRN

## 2020-05-30 NOTE — Progress Notes (Addendum)
SUBJECTIVE:   CHIEF COMPLAINT / HPI:   Pilar Westergaard is a very pleasant 75 year old woman with history significant for mood disorder, type 2 diabetes, hypertension, atrial fibrillation on Eliquis therapy and tobacco abuse presenting today for routine follow-up.  She is joined by her son [name].  The patient overall has been doing better the last few weeks.  She has come off her Seroquel in the last year and has had a slight increase in her anxiety.  This increases her smoking.  She is smoking closer to 7 to 8 cigarettes/day.  She is precontemplative about quitting.  The patient and her son also reports some dietary indiscretions.  She denies polyuria or polydipsia.  She would like to get back to a healthier diet and her son agrees with this.  She is taking her medications as prescribed.  The patient uses a wheelchair to ambulate.This wheelchair no longer works well and is causing pressure on her lateral thighs.   The patient recently saw Dr. Loletha Grayer, whose visit they very much appreciated.  They did lengthy discussion about aortic aneurysms and her atrial fibrillation.  Her son would like her to reduce her cigarette use but wants to work in the context of the patient's wishes and believes to do so.  Patient does not want to quit entirely but is amenable to cutting down. Patient amenable to trying behavioral interventions to reduce smoking.  Discussed DEXA scan, the patient and [name] would like to consider and focus on increasing dietary calcium and vitamin D.   The patient reports a worsening cough and congestion for the past 4 to 5 weeks.  She reports since her emergency department visit she has felt congested.  She denies fevers, worsening dyspnea on exertion, chest pain lower extremity edema or other symptoms.  She had a chest x-ray in the emergency department was read as negative and prescribed albuterol.  She is only using the inhaler at this point and is not using her Trelegy.  PERTINENT  PMH /  PSH/Family/Social History : Updated and reviewed.   OBJECTIVE:   BP (!) 145/70   Pulse 72   SpO2 95%   Today's weight:  Review of prior weights:Not taken today  Cardiac: Regular rate and rhythm. Normal S1/S2. No murmurs, rubs, or gallops appreciated. Lungs: Coarse bilateral breath sounds, wheezing in L upper field .  Abdomen:  Umbilicus without discharge or lesion.  Midline hernias soft, no tenderness on palpation Psych: Pleasant and appropriate  Bilateral feet examined she has tenderness to palpation over her right calcaneus on the plantar aspect, mild pes cavus deformity on right as compared to left.  No lesions or ulcers.  ASSESSMENT/PLAN:   Essential hypertension At goal today, discussed dietary changes  COPD Given persistent cough and mild wheezing on exam will obtain chest x-ray, this is been present over a month, lower suspicion for pneumonia although not entirely ruled out.  Will obtain labs and chest x-ray.  Long-term use of high-risk medication Monitoring labs today.  Well controlled type 2 diabetes mellitus ([place]) Still well below goal discussed dietary changes interventions.  Encourage smoking cessation which would be the best thing to reduce her risk of adverse cardiovascular event.   Tobacco use-discussed motivational interviewing techniques used to encourage cessation, I agree that she would reduce to 6 cigarettes/day.  We will plan for follow-up.  Discussed benefits of quitting at length.  Possible adrenal adenomas- discuss at follow up if would like evaluation  Leukocytosis- had  immature cells on April labs--repeat today, if persistent, discuss Hematology referral again (declined in past)--in context of patient's preferences, she may not want this evaluated but will check today to ensure not markedly higher.  Pez Cavus Deformity with plantar fasciitis patient has symptoms of symptoms of plantar fasciitis, I do have concerns for neuropathy and possibly development of  Charcot arthropathy in the future given the change in her right foot, referral to podiatry.  Chronic Back Pain and post polio syndrome Patient suffers from degenerative disc disease and postpolio syndrome which impairs their ability to perform daily activities like bathing, dressing, grooming and toileting in the home.  A cane, crutch or walker will not resolve issue with performing activities of daily living. A wheelchair will allow patient to safely perform daily activities. Patient can safely propel the wheelchair in the home or has a caregiver who can provide assistance. Length of need Lifetime.   HCM Discussed DEXA     [name], MD  [place]

## 2020-05-30 NOTE — Assessment & Plan Note (Signed)
Monitoring labs today

## 2020-05-30 NOTE — Patient Instructions (Addendum)
It was wonderful to see you today.  Please bring ALL of your medications with you to every visit.   Today we talked about:  - Nebulizer refills are at your [place] [facility] 6 per day  When you have a craving, consider - Reading a bible verse - listening to music  Your A1C is 6.9--we will repeat in 3 months  Please get an x-ray next week   My schedule for August is not out just yet--I will call you about an appointment    Thank you for choosing [name].   Please call [phone] with any questions about today's appointment.  Please be sure to schedule follow up at the front  desk before you leave today.   [name], MD  Family Medicine

## 2020-05-30 NOTE — Assessment & Plan Note (Signed)
Still well below goal discussed dietary changes interventions.  Encourage smoking cessation which would be the best thing to reduce her risk of adverse cardiovascular event.

## 2020-05-30 NOTE — Assessment & Plan Note (Signed)
Given persistent cough and mild wheezing on exam will obtain chest x-ray, this is been present over a month, lower suspicion for pneumonia although not entirely ruled out.  Will obtain labs and chest x-ray.

## 2020-05-30 NOTE — Assessment & Plan Note (Signed)
At goal today, discussed dietary changes

## 2020-05-31 LAB — CBC WITH DIFFERENTIAL/PLATELET
Basophils Absolute: 0.1 10*3/uL (ref 0.0–0.2)
Basos: 1 %
EOS (ABSOLUTE): 0.6 10*3/uL — ABNORMAL HIGH (ref 0.0–0.4)
Eos: 5 %
Hematocrit: 39.8 % (ref 34.0–46.6)
Hemoglobin: 13 g/dL (ref 11.1–15.9)
Immature Grans (Abs): 0.1 10*3/uL (ref 0.0–0.1)
Immature Granulocytes: 1 %
Lymphocytes Absolute: 3.4 10*3/uL — ABNORMAL HIGH (ref 0.7–3.1)
Lymphs: 26 %
MCH: 27.9 pg (ref 26.6–33.0)
MCHC: 32.7 g/dL (ref 31.5–35.7)
MCV: 85 fL (ref 79–97)
Monocytes Absolute: 0.8 10*3/uL (ref 0.1–0.9)
Monocytes: 6 %
Neutrophils Absolute: 8 10*3/uL — ABNORMAL HIGH (ref 1.4–7.0)
Neutrophils: 61 %
Platelets: 280 10*3/uL (ref 150–450)
RBC: 4.66 x10E6/uL (ref 3.77–5.28)
RDW: 13.8 % (ref 11.7–15.4)
WBC: 13 10*3/uL — ABNORMAL HIGH (ref 3.4–10.8)

## 2020-05-31 LAB — RENAL FUNCTION PANEL
Albumin: 4.1 g/dL (ref 3.7–4.7)
BUN/Creatinine Ratio: 17 (ref 12–28)
BUN: 25 mg/dL (ref 8–27)
CO2: 17 mmol/L — ABNORMAL LOW (ref 20–29)
Calcium: 9.2 mg/dL (ref 8.7–10.3)
Chloride: 104 mmol/L (ref 96–106)
Creatinine, Ser: 1.5 mg/dL — ABNORMAL HIGH (ref 0.57–1.00)
Glucose: 113 mg/dL — ABNORMAL HIGH (ref 65–99)
Phosphorus: 3.2 mg/dL (ref 3.0–4.3)
Potassium: 4.1 mmol/L (ref 3.5–5.2)
Sodium: 138 mmol/L (ref 134–144)
eGFR: 36 mL/min/{1.73_m2} — ABNORMAL LOW (ref >59)

## 2020-05-31 LAB — LIPID PANEL
Chol/HDL Ratio: 3.7 ratio (ref 0.0–4.4)
Cholesterol, Total: 155 mg/dL (ref 100–199)
HDL: 42 mg/dL (ref >39)
LDL Chol Calc (NIH): 77 mg/dL (ref 0–99)
Triglycerides: 218 mg/dL — ABNORMAL HIGH (ref 0–149)
VLDL Cholesterol Cal: 36 mg/dL (ref 5–40)

## 2020-06-01 LAB — PATHOLOGIST SMEAR REVIEW
Basophils Absolute: 0.1 10*3/uL (ref 0.0–0.2)
Basos: 1 %
EOS (ABSOLUTE): 0.6 10*3/uL — ABNORMAL HIGH (ref 0.0–0.4)
Eos: 5 %
Hematocrit: 40.2 % (ref 34.0–46.6)
Hemoglobin: 13.1 g/dL (ref 11.1–15.9)
Immature Grans (Abs): 0.1 10*3/uL (ref 0.0–0.1)
Immature Granulocytes: 1 %
Lymphocytes Absolute: 3.3 10*3/uL — ABNORMAL HIGH (ref 0.7–3.1)
Lymphs: 26 %
MCH: 27.9 pg (ref 26.6–33.0)
MCHC: 32.6 g/dL (ref 31.5–35.7)
MCV: 86 fL (ref 79–97)
Monocytes Absolute: 0.7 10*3/uL (ref 0.1–0.9)
Monocytes: 5 %
Neutrophils Absolute: 7.9 10*3/uL — ABNORMAL HIGH (ref 1.4–7.0)
Neutrophils: 62 %
Platelets: 285 10*3/uL (ref 150–450)
RBC: 4.7 x10E6/uL (ref 3.77–5.28)
RDW: 13.7 % (ref 11.7–15.4)
WBC: 12.6 10*3/uL — ABNORMAL HIGH (ref 3.4–10.8)

## 2020-06-02 LAB — HEPATIC FUNCTION PANEL
ALT: 9 IU/L (ref 0–32)
AST: 9 IU/L (ref 0–40)
Albumin: 4.1 g/dL (ref 3.7–4.7)
Alkaline Phosphatase: 75 IU/L (ref 44–121)
Bilirubin Total: <0.2 mg/dL (ref 0.0–1.2)
Bilirubin, Direct: <0.1 mg/dL (ref 0.00–0.40)
Total Protein: 6.5 g/dL (ref 6.0–8.5)

## 2020-06-02 LAB — SPECIMEN STATUS REPORT

## 2020-06-04 ENCOUNTER — Telehealth: Payer: Self-pay | Admitting: Family Medicine

## 2020-06-04 DIAGNOSIS — D729 Disorder of white blood cells, unspecified: Secondary | ICD-10-CM

## 2020-06-04 NOTE — Telephone Encounter (Signed)
Called with results, discussed. Given ongoing leukocytosis and eosinophilia, referral to Hematology.  [name], MD  Family Medicine Teaching Service

## 2020-06-06 ENCOUNTER — Telehealth: Payer: Self-pay | Admitting: Internal Medicine

## 2020-06-06 ENCOUNTER — Other Ambulatory Visit: Payer: Self-pay

## 2020-06-06 NOTE — Telephone Encounter (Signed)
Received a new hem referral from Dr. [name] for neutrophilia. [name] has been cld and scheduled to see Dr. Julien Nordmann on 6/13 at 11:45am w/labs at 11:115am. Pt aware to arrive 15 minutes early. Pt also preferred appts in June.

## 2020-06-07 ENCOUNTER — Encounter: Payer: Self-pay | Admitting: Family Medicine

## 2020-06-07 ENCOUNTER — Ambulatory Visit (INDEPENDENT_AMBULATORY_CARE_PROVIDER_SITE_OTHER): Payer: Medicare Other

## 2020-06-07 ENCOUNTER — Other Ambulatory Visit: Payer: Self-pay | Admitting: Podiatry

## 2020-06-07 ENCOUNTER — Ambulatory Visit
Admission: RE | Admit: 2020-06-07 | Discharge: 2020-06-07 | Disposition: A | Discharge status: Home / Self Care | Payer: Medicare Other | Source: Ambulatory Visit | Attending: Family Medicine | Admitting: Family Medicine

## 2020-06-07 ENCOUNTER — Ambulatory Visit (INDEPENDENT_AMBULATORY_CARE_PROVIDER_SITE_OTHER): Payer: Medicare Other | Admitting: Podiatry

## 2020-06-07 ENCOUNTER — Other Ambulatory Visit: Payer: Self-pay

## 2020-06-07 DIAGNOSIS — E1142 Type 2 diabetes mellitus with diabetic polyneuropathy: Secondary | ICD-10-CM

## 2020-06-07 DIAGNOSIS — R059 Cough, unspecified: Secondary | ICD-10-CM | POA: Diagnosis not present

## 2020-06-07 DIAGNOSIS — B351 Tinea unguium: Secondary | ICD-10-CM

## 2020-06-07 DIAGNOSIS — M7731 Calcaneal spur, right foot: Secondary | ICD-10-CM

## 2020-06-07 DIAGNOSIS — R053 Chronic cough: Secondary | ICD-10-CM

## 2020-06-07 DIAGNOSIS — E1169 Type 2 diabetes mellitus with other specified complication: Secondary | ICD-10-CM

## 2020-06-07 DIAGNOSIS — M79671 Pain in right foot: Secondary | ICD-10-CM

## 2020-06-07 DIAGNOSIS — I10 Essential (primary) hypertension: Secondary | ICD-10-CM

## 2020-06-07 MED ORDER — AMLODIPINE BESYLATE 10 MG PO TABS: 10.0000 mg | ORAL_TABLET | Freq: Every day | ORAL | 3 refills | Status: DC

## 2020-06-07 NOTE — Progress Notes (Signed)
  Subjective:  Patient ID: Abigail Wallace, female    DOB: [birth year],  MRN: [id number]  Chief Complaint  Patient presents with  . Diabetes    6.9  . Foot Problem    Heel pain x1-2 months now constant stinging pains.   . Nail Problem    Thick/painful toenails    75 y.o. female presents with the above complaint. History confirmed with patient.   Objective:  Physical Exam: warm, good capillary refill, nail exam onychomycosis of the toenails, no trophic changes or ulcerative lesions. DP pulses palpable, PT pulses palpable and protective sensation absent. Non-palp PTs. HPK medial arch right  No images are attached to the encounter.  Assessment:   1. Heel spur, right   2. Onychomycosis of multiple toenails with type 2 diabetes mellitus and peripheral neuropathy ([place])    Plan:  Patient was evaluated and treated and all questions answered.  Onychomycosis, Diabetes and DPN -Patient is diabetic with a qualifying condition for at risk foot care.  Procedure: Nail Debridement Type of Debridement: manual, sharp debridement. Instrumentation: Nail nipper, rotary burr. Number of Nails: 10  Procedure: Paring of Lesion Rationale: painful hyperkeratotic lesion Type of Debridement: manual, sharp debridement. Instrumentation: 312 blade Number of Lesions: 1  Return in about 3 months (around 09/07/2020) for Diabetic Foot Care.

## 2020-06-08 ENCOUNTER — Telehealth: Payer: Self-pay | Admitting: Family Medicine

## 2020-06-08 NOTE — Telephone Encounter (Signed)
Called with normal chest x-ray results.  No pneumonia.  Encouraged smoking cessation.   All questions answered.  [name], MD  Family Medicine Teaching Service

## 2020-06-14 ENCOUNTER — Telehealth: Payer: Self-pay | Admitting: Family Medicine

## 2020-06-14 NOTE — Telephone Encounter (Signed)
Patient is calling and would like for Dr. [name] to call her when she gets a chance.   The bets call back is [phone]

## 2020-06-15 NOTE — Telephone Encounter (Signed)
Called patient to check in. She reports some nighttime cough. No dyspnea, chest pain, edema. Eating well. Instructed to use albuterol 2 puffs before bed.  Reviewed reasons to call back.  [name], MD  Family Medicine Teaching Service

## 2020-06-20 ENCOUNTER — Other Ambulatory Visit: Payer: Self-pay | Admitting: *Deleted

## 2020-06-20 ENCOUNTER — Encounter: Payer: Self-pay | Admitting: Family Medicine

## 2020-06-20 DIAGNOSIS — G8929 Other chronic pain: Secondary | ICD-10-CM

## 2020-06-20 DIAGNOSIS — M1711 Unilateral primary osteoarthritis, right knee: Secondary | ICD-10-CM

## 2020-06-20 DIAGNOSIS — M5440 Lumbago with sciatica, unspecified side: Secondary | ICD-10-CM

## 2020-06-20 DIAGNOSIS — J449 Chronic obstructive pulmonary disease, unspecified: Secondary | ICD-10-CM

## 2020-06-20 MED ORDER — ANORO ELLIPTA 62.5-25 MCG/INH IN AEPB: 1.0000 | INHALATION_SPRAY | Freq: Every day | RESPIRATORY_TRACT | 3 refills | Status: DC

## 2020-06-20 MED ORDER — ALBUTEROL SULFATE HFA 108 (90 BASE) MCG/ACT IN AERS: 2.0000 | INHALATION_SPRAY | Freq: Four times a day (QID) | RESPIRATORY_TRACT | 3 refills | Status: DC | PRN

## 2020-06-23 ENCOUNTER — Telehealth: Payer: Self-pay

## 2020-06-23 DIAGNOSIS — M1711 Unilateral primary osteoarthritis, right knee: Secondary | ICD-10-CM

## 2020-06-23 DIAGNOSIS — G8929 Other chronic pain: Secondary | ICD-10-CM

## 2020-06-23 DIAGNOSIS — G14 Postpolio syndrome: Secondary | ICD-10-CM

## 2020-06-23 NOTE — Telephone Encounter (Signed)
Community message sent to [employer]. Will await response.   Kymber Kosar C Alesi Zachery, RN  

## 2020-06-23 NOTE — Telephone Encounter (Signed)
Community message has been sent to [employer]. Will await response.   Talbot Grumbling, RN

## 2020-06-23 NOTE — Telephone Encounter (Signed)
Please see below message from [employer].   received, thanks   Talbot Grumbling, RN

## 2020-06-24 ENCOUNTER — Other Ambulatory Visit: Payer: Self-pay

## 2020-06-24 DIAGNOSIS — D72829 Elevated white blood cell count, unspecified: Secondary | ICD-10-CM

## 2020-06-27 ENCOUNTER — Inpatient Hospital Stay: Payer: Medicare Other

## 2020-06-27 ENCOUNTER — Inpatient Hospital Stay: Payer: Medicare Other | Attending: Internal Medicine | Admitting: Internal Medicine

## 2020-06-27 NOTE — Telephone Encounter (Signed)
Patients son calls nurse line requesting DME wheelchair order sent to [employer]. DME order printed and signed by PCP. Order and demographics faxed to [phone].

## 2020-06-29 ENCOUNTER — Encounter: Payer: Self-pay | Admitting: Family Medicine

## 2020-06-29 DIAGNOSIS — G8929 Other chronic pain: Secondary | ICD-10-CM

## 2020-06-29 DIAGNOSIS — U071 COVID-19: Secondary | ICD-10-CM

## 2020-06-29 DIAGNOSIS — I480 Paroxysmal atrial fibrillation: Secondary | ICD-10-CM

## 2020-06-29 DIAGNOSIS — E1165 Type 2 diabetes mellitus with hyperglycemia: Secondary | ICD-10-CM

## 2020-06-29 DIAGNOSIS — N1832 Chronic kidney disease, stage 3b: Secondary | ICD-10-CM

## 2020-06-30 ENCOUNTER — Other Ambulatory Visit: Payer: Self-pay | Admitting: Family Medicine

## 2020-06-30 DIAGNOSIS — J42 Unspecified chronic bronchitis: Secondary | ICD-10-CM

## 2020-06-30 MED ORDER — ALBUTEROL SULFATE (2.5 MG/3ML) 0.083% IN NEBU: 2.5000 mg | INHALATION_SOLUTION | Freq: Four times a day (QID) | RESPIRATORY_TRACT | 6 refills | Status: DC | PRN

## 2020-06-30 NOTE — Telephone Encounter (Signed)
Called to discuss symptoms and recommendations. Patients O2 saturations appropriate, doing well, eating, and drinking. Has mild cold symptoms and aches. Recommended oral antiviral (cannot get Paxlovid as on apixaban). Discussed alternatives  (molnupiravir or MAB infusion at Pulmonary). Patient and son counseled about EUA, potential benefits. They would like to pursue conservative care, no antivirals/MABS at this time.   Nursing-  patient needs new tubing/mask for nebulizer. Ordered as DME. Can you please help patient obtain new tubing/mask?  Thanks! CB

## 2020-06-30 NOTE — Telephone Encounter (Signed)
Called patient to check in. She is doing well, breathing okay, and speaking in full sentences. Feeling fatigued but eating and drinking well.  Left message for son to call back.  If son calls back, please ask good time to call. Will try again as able (precepting all day).  [name], MD  Family Medicine Teaching Service

## 2020-06-30 NOTE — Telephone Encounter (Signed)
Patient's son returns call to nurse line. Reports normal breathing with Sp02 measuring between 93-97%. States that patient is eating and drinking normally.   Son requests returned phone call to discuss medication further.   ED precautions given.   Talbot Grumbling, RN

## 2020-07-01 ENCOUNTER — Telehealth: Payer: Self-pay

## 2020-07-01 MED ORDER — MOLNUPIRAVIR EUA 200MG CAPSULE: 4.0000 | ORAL_CAPSULE | Freq: Two times a day (BID) | ORAL | 0 refills | Status: AC

## 2020-07-01 NOTE — Telephone Encounter (Signed)
Community message sent to [employer] for nebulizer supplies. Will await response.  Talbot Grumbling, RN

## 2020-07-02 MED ORDER — SODIUM BICARBONATE 650 MG PO TABS: 650.0000 mg | ORAL_TABLET | Freq: Two times a day (BID) | ORAL | 3 refills | Status: DC

## 2020-07-02 MED ORDER — ALLOPURINOL 100 MG PO TABS: 100.0000 mg | ORAL_TABLET | Freq: Every day | ORAL | 3 refills | Status: DC

## 2020-07-02 MED ORDER — VITAMIN D 25 MCG (1000 UNIT) PO TABS: 1000.0000 [IU] | ORAL_TABLET | Freq: Every day | ORAL | 3 refills | Status: DC

## 2020-07-02 MED ORDER — ELIQUIS 5 MG PO TABS: 1.0000 | ORAL_TABLET | Freq: Two times a day (BID) | ORAL | 3 refills | Status: DC

## 2020-07-02 NOTE — Addendum Note (Signed)
Addended by: [name], Chirsty Armistead on: 07/02/2020 12:06 PM   Modules accepted: Orders

## 2020-07-04 ENCOUNTER — Ambulatory Visit (INDEPENDENT_AMBULATORY_CARE_PROVIDER_SITE_OTHER): Payer: Medicare Other

## 2020-07-04 DIAGNOSIS — I442 Atrioventricular block, complete: Secondary | ICD-10-CM | POA: Diagnosis not present

## 2020-07-06 LAB — CUP PACEART REMOTE DEVICE CHECK
Battery Impedance: 596 Ohm
Battery Remaining Longevity: 73 mo
Battery Voltage: 2.78 V
Brady Statistic AP VP Percent: 67 %
Brady Statistic AP VS Percent: 0 %
Brady Statistic AS VP Percent: 33 %
Brady Statistic AS VS Percent: 0 %
Date Time Interrogation Session: 20220621201531
Implantable Lead Implant Date: 20070414
Implantable Lead Implant Date: 20070914
Implantable Lead Location: 753859
Implantable Lead Location: 753860
Implantable Lead Model: 4092
Implantable Lead Model: 5594
Implantable Pulse Generator Implant Date: 20160816
Lead Channel Impedance Value: 584 Ohm
Lead Channel Impedance Value: 620 Ohm
Lead Channel Pacing Threshold Amplitude: 0.625 V
Lead Channel Pacing Threshold Amplitude: 1.125 V
Lead Channel Pacing Threshold Pulse Width: 0.4 ms
Lead Channel Pacing Threshold Pulse Width: 0.4 ms
Lead Channel Setting Pacing Amplitude: 2 V
Lead Channel Setting Pacing Amplitude: 2.5 V
Lead Channel Setting Pacing Pulse Width: 0.4 ms
Lead Channel Setting Sensing Sensitivity: 4 mV

## 2020-07-11 NOTE — Addendum Note (Signed)
Addended by: [name], Cary Lothrop on: 07/11/2020 03:26 PM   Modules accepted: Orders

## 2020-07-11 NOTE — Telephone Encounter (Signed)
[name] calls nurse line requesting an update on wheelchair. I advised him the information was faxed on 6/13 and since we did not send to [employer] I am unsure of updates. [name] advised to call the company to check the status.   [name] is always requesting a DME for bedside assist rails. He would like this faxed to preferred company as well. Please let me know when order has been placed and I will fax to preferred company.

## 2020-07-13 ENCOUNTER — Other Ambulatory Visit: Payer: Self-pay

## 2020-07-13 DIAGNOSIS — E1165 Type 2 diabetes mellitus with hyperglycemia: Secondary | ICD-10-CM

## 2020-07-13 MED ORDER — ONETOUCH VERIO VI STRP: ORAL_STRIP | 12 refills | Status: DC

## 2020-07-13 MED ORDER — ONETOUCH DELICA LANCETS 33G MISC: 3 refills | Status: DC

## 2020-07-14 MED ORDER — ONETOUCH DELICA LANCETS 33G MISC: 3 refills | Status: DC

## 2020-07-14 NOTE — Addendum Note (Signed)
Addended by: [name], Krystiana Fornes on: 07/14/2020 02:31 PM   Modules accepted: Orders

## 2020-07-14 NOTE — Telephone Encounter (Signed)
DME order for rails faxed to [phone].

## 2020-07-15 MED ORDER — ONETOUCH DELICA LANCETS 33G MISC: 3 refills | Status: DC

## 2020-07-15 MED ORDER — ONETOUCH VERIO VI STRP: ORAL_STRIP | 12 refills | Status: DC

## 2020-07-15 MED ORDER — ONETOUCH VERIO VI STRP
ORAL_STRIP | 12 refills | Status: DC
Start: 2020-07-15 — End: 2020-07-15

## 2020-07-15 NOTE — Addendum Note (Signed)
Addended by: Talbot Grumbling on: 07/15/2020 10:57 AM   Modules accepted: Orders

## 2020-07-15 NOTE — Telephone Encounter (Signed)
Patient calls nurse line regarding issues with glucometer supplies.   Called pharmacist. Per pharmacist, they are unable to fill prescriptions due to patient having Medicare part D. Advised that rx be resent to local chain pharmacy. Called patient, requesting that rx be sent to [facility] in [place]. Prescriptions canceled at [facility] and resent to [facility] in [place].   Talbot Grumbling, RN

## 2020-07-19 ENCOUNTER — Telehealth: Payer: Self-pay | Admitting: Family Medicine

## 2020-07-19 NOTE — Telephone Encounter (Signed)
Patient is calling and would like to have her Senna refilled.

## 2020-07-20 MED ORDER — SENNA 8.6 MG PO TABS: 1.0000 | ORAL_TABLET | Freq: Every day | ORAL | 0 refills | Status: DC | PRN

## 2020-07-20 NOTE — Addendum Note (Signed)
Addended by: [name], Doye Montilla on: 07/20/2020 12:23 PM   Modules accepted: Orders

## 2020-07-20 NOTE — Telephone Encounter (Signed)
Informed patient of RX.  .Darragh Nay R Iridiana Fonner, CMA  

## 2020-07-20 NOTE — Telephone Encounter (Signed)
A refill was sent to [facility]. Please let patient know.  [name], MD  Family Medicine Teaching Service

## 2020-07-20 NOTE — Addendum Note (Signed)
Addended by: [name], Soua Caltagirone on: 07/20/2020 04:46 PM   Modules accepted: Orders

## 2020-07-21 ENCOUNTER — Telehealth: Payer: Self-pay

## 2020-07-21 NOTE — Telephone Encounter (Signed)
Please see May office note.

## 2020-07-21 NOTE — Telephone Encounter (Signed)
Received phone call from [facility] regarding issues with [employer] and getting wheelchair. Called and spoke with representative at [employer]. Representative states that they need face to face OV note with the following wording:  Patient suffers from degenerative disc disease and postpolio syndrome which impairs their ability to perform daily activities like bathing, dressing, grooming and toileting in the home.  A cane, crutch or walker will not resolve issue with performing activities of daily living. A wheelchair will allow patient to safely perform daily activities. Patient can safely propel the wheelchair in the home or has a caregiver who can provide assistance. Length of need Lifetime.   Forwarding to PCP.   Talbot Grumbling, RN

## 2020-07-21 NOTE — Telephone Encounter (Signed)
Community message sent to [employer] for bedside assistance bar.   Will await response.   Talbot Grumbling, RN

## 2020-07-21 NOTE — Telephone Encounter (Signed)
See below message from [employer].   Gm! l Will get this out to the patient! Thank you!   Talbot Grumbling, RN

## 2020-07-22 NOTE — Progress Notes (Signed)
Remote pacemaker transmission.   

## 2020-07-22 NOTE — Telephone Encounter (Signed)
Faxed addended OV note to [employer].   Talbot Grumbling, RN

## 2020-07-25 ENCOUNTER — Other Ambulatory Visit: Payer: Self-pay

## 2020-07-25 ENCOUNTER — Other Ambulatory Visit: Payer: Self-pay | Admitting: Cardiovascular Disease

## 2020-07-25 MED ORDER — PRAVASTATIN SODIUM 40 MG PO TABS: 40.0000 mg | ORAL_TABLET | Freq: Every evening | ORAL | 0 refills | Status: DC

## 2020-07-26 ENCOUNTER — Other Ambulatory Visit: Payer: Self-pay

## 2020-07-26 DIAGNOSIS — I5032 Chronic diastolic (congestive) heart failure: Secondary | ICD-10-CM

## 2020-07-26 MED ORDER — METOPROLOL TARTRATE 50 MG PO TABS: 100.0000 mg | ORAL_TABLET | Freq: Two times a day (BID) | ORAL | 2 refills | Status: DC

## 2020-08-01 DIAGNOSIS — N1832 Chronic kidney disease, stage 3b: Secondary | ICD-10-CM | POA: Diagnosis not present

## 2020-08-09 ENCOUNTER — Telehealth: Payer: Self-pay | Admitting: Gastroenterology

## 2020-08-09 ENCOUNTER — Telehealth: Payer: Self-pay

## 2020-08-09 DIAGNOSIS — K5900 Constipation, unspecified: Secondary | ICD-10-CM

## 2020-08-09 MED ORDER — DOCUSATE SODIUM 100 MG PO CAPS: 100.0000 mg | ORAL_CAPSULE | Freq: Two times a day (BID) | ORAL | 0 refills | Status: DC

## 2020-08-09 NOTE — Telephone Encounter (Signed)
The pt was last seen in Dec of 2020 for anemia.  She is calling today with complaints of constipation. I have advised her that she needs an appt to discuss prescription meds for constipation.  She has asked for something in September.  I have her scheduled and advised her to call her PCP in the meantime. The pt has been advised of the information and verbalized understanding.

## 2020-08-09 NOTE — Telephone Encounter (Signed)
Pt states that she has been constipated. She has tried OTC meds but nothing works. She would like something prescribed.

## 2020-08-09 NOTE — Telephone Encounter (Signed)
Patient calls nurse line requesting medication for constipation. Patient reports having hard bowel movement earlier today, however, she still feels like she has to have a bowel movement but cannot not. Reports taking Senna one day last week and one day this week. She has also tried an equate version of Miralax BID.   Denies abdominal pain, vomiting, or distention.   Patient also reports falling today. Patient states that she fell out of bed. States that she had to call 911 to help assist her up. Denies head injury or LOC.   Patient is requesting returned phone call from Dr. [name] to discuss additional medication for constipation.   Talbot Grumbling, RN

## 2020-08-09 NOTE — Telephone Encounter (Signed)
Called patient to discuss constipation.  She is taking senna twice a week and MiraLAX twice daily.  Rx for Colace to pharmacy.  She had 2 bowel movements today.  Today she also fell on her right buttock.  She was trying to sit down and missed the chair she reports.  She denies hitting her head.  She was evaluated by EMS.  Recommended evaluation and possible x-rays.  She declined at this time.  She will call back if her pain worsens.  We reviewed return precautions.

## 2020-08-10 DIAGNOSIS — I129 Hypertensive chronic kidney disease with stage 1 through stage 4 chronic kidney disease, or unspecified chronic kidney disease: Secondary | ICD-10-CM | POA: Diagnosis not present

## 2020-08-10 DIAGNOSIS — N1832 Chronic kidney disease, stage 3b: Secondary | ICD-10-CM | POA: Diagnosis not present

## 2020-08-10 DIAGNOSIS — D631 Anemia in chronic kidney disease: Secondary | ICD-10-CM | POA: Diagnosis not present

## 2020-08-10 DIAGNOSIS — R399 Unspecified symptoms and signs involving the genitourinary system: Secondary | ICD-10-CM | POA: Diagnosis not present

## 2020-08-10 DIAGNOSIS — N2581 Secondary hyperparathyroidism of renal origin: Secondary | ICD-10-CM | POA: Diagnosis not present

## 2020-08-11 DIAGNOSIS — F200 Paranoid schizophrenia: Secondary | ICD-10-CM | POA: Diagnosis not present

## 2020-09-05 ENCOUNTER — Other Ambulatory Visit: Payer: Self-pay

## 2020-09-05 ENCOUNTER — Encounter: Payer: Self-pay | Admitting: Family Medicine

## 2020-09-05 ENCOUNTER — Ambulatory Visit (INDEPENDENT_AMBULATORY_CARE_PROVIDER_SITE_OTHER): Payer: Medicare Other | Admitting: Family Medicine

## 2020-09-05 VITALS — BP 118/70 | HR 70 | Wt 270.8 lb

## 2020-09-05 DIAGNOSIS — Z993 Dependence on wheelchair: Secondary | ICD-10-CM

## 2020-09-05 DIAGNOSIS — Z23 Encounter for immunization: Secondary | ICD-10-CM | POA: Diagnosis not present

## 2020-09-05 DIAGNOSIS — E119 Type 2 diabetes mellitus without complications: Secondary | ICD-10-CM | POA: Diagnosis not present

## 2020-09-05 DIAGNOSIS — R200 Anesthesia of skin: Secondary | ICD-10-CM

## 2020-09-05 DIAGNOSIS — F17200 Nicotine dependence, unspecified, uncomplicated: Secondary | ICD-10-CM

## 2020-09-05 DIAGNOSIS — N1832 Chronic kidney disease, stage 3b: Secondary | ICD-10-CM | POA: Diagnosis not present

## 2020-09-05 LAB — POCT GLYCOSYLATED HEMOGLOBIN (HGB A1C): HbA1c, POC (prediabetic range): 6.3 % (ref 5.7–6.4)

## 2020-09-05 MED ORDER — TETANUS-DIPHTH-ACELL PERTUSSIS 5-2.5-18.5 LF-MCG/0.5 IM SUSY: 0.5000 mL | PREFILLED_SYRINGE | Freq: Once | INTRAMUSCULAR | 0 refills | Status: AC

## 2020-09-05 NOTE — Assessment & Plan Note (Signed)
Currently smoking 6 cigarettes/day.  We discussed cutting down.  Recommended cessation.  Her brother recently quit which could be a motivating factor for her in the future.

## 2020-09-05 NOTE — Patient Instructions (Signed)
It was wonderful to see you today.  Please bring ALL of your medications with you to every visit.   Today we talked about:   CONGRATULATIONS ON YOUR A1C  You can take melatonin safely   I will call you with results   I sent a prescription for your tetanus vaccine to your pharmacy   Follow up in 3 months    Thank you for choosing [facility].   Please call [phone] with any questions about today's appointment.  Please be sure to schedule follow up at the front  desk before you leave today.   [name], MD  Family Medicine

## 2020-09-05 NOTE — Assessment & Plan Note (Signed)
Discussed and encouraged ambulation.

## 2020-09-05 NOTE — Assessment & Plan Note (Signed)
Congratulated on A1c.  We discussed dietary changes and potentially increasing activity again to reduce sedentary lifestyle.  She has made marked improvement in dietary changes.  A1c is at goal.  Continue linagliptin.  Started on an ARB by nephrology for renal protection.  Repeat renal function panel today.

## 2020-09-05 NOTE — Progress Notes (Signed)
SUBJECTIVE:   CHIEF COMPLAINT: diabetes check  HPI:   Abigail Wallace is a 75 y.o. yo with history notable for type 2 diabetes, CKD IIIb, tobacco abuse, atrial fibrillation on apixaban,  mood disorder, and tobacco use presenting for diabetes check.   The patient is joined by her son today, Abigail Wallace.  He reports overall she is doing okay.  She has slipped out of bed since her last visit she was trying to move from her bed to the wheelchair and slipped out of the bed.  She does not have a medical alert bracelet at home.  This is been out of her price range for some time.   She has a history of OA of the hip and back as well as post polio syndrome. She uses a wheelchair for mobility.  She has been having more issues with transferring recently.  She has not been ambulating as much recently.  She does not endorse some bilateral leg numbness which is chronic and ongoing.  Her pain is controlled with Tylenol at appropriate doses and intermittent use of small amounts of Voltaren gel.  The patient reports that she is back to yogurt in the morning and limiting her cherry turnover's.  Her A1c is improved.  The patient was recently started on olmesartan by nephrology.  Her blood pressure today is markedly improved.  Her son is watching for lobe measurements.  The patient continues to follow with psychiatry, Joe.  She was recently taken off her Seroquel several months ago and is using melatonin at night now.  She does continue to have issues with sleep.  She has some anxiety as her son has advanced chronic kidney disease and a number of other stressors within the family.  Medication list is updated and reviewed today.  She is not taking her Norco.  She typically takes Zyprexa at night although intermittently does still take her Seroquel.  PERTINENT  PMH / PSH/Family/Social History : The patient is due for CT scan to follow-up on pulmonary nodules in February.  She was recently referred to hematology for  ongoing leukocytosis, she would not like to see them at this time we discussed this again today.  OBJECTIVE:   BP 118/70   Pulse 70   Wt 270 lb 12.8 oz (122.8 kg)   SpO2 99%   BMI 43.71 kg/m   Today's weight:  Last Weight  Most recent update: 09/05/2020 11:09 AM    Weight  122.8 kg (270 lb 12.8 oz)            Review of prior weights: Filed Weights   09/05/20 1109  Weight: 270 lb 12.8 oz (122.8 kg)   Pleasant appearing woman in no distress.  Scalp closely examined no scaling or patches of hair that are missing.  She has some mild tremors of her mandible.  Lungs with intermittent wheezing throughout the right lung base that clears when she coughs.  Cardiac exam regular rate and rhythm.  Abdominal exam nontender there is no drainage from prior site of sebaceous cyst.  Intertrigo well-controlled today.  Feet examined no wounds, skin breakdown or ulcers.  Sensate to physical exam.  She is wearing sneakers today.  ASSESSMENT/PLAN:   Well controlled type 2 diabetes mellitus ([place]) Congratulated on A1c.  We discussed dietary changes and potentially increasing activity again to reduce sedentary lifestyle.  She has made marked improvement in dietary changes.  A1c is at goal.  Continue linagliptin.  Started on an ARB by  nephrology for renal protection.  Repeat renal function panel today.  Dependence on wheelchair Discussed and encouraged ambulation.  Tobacco use disorder Currently smoking 6 cigarettes/day.  We discussed cutting down.  Recommended cessation.  Her brother recently quit which could be a motivating factor for her in the future.   At high risk for falls personally called [facility] and was asked to call back when their secretary return tomorrow.  They have been waiting on a new wheelchair.  The brakes on her current one did not work well and thus predisposed her to falling.  She would also benefit from a bed rail at the end of her bed to limit her sliding from bed.  We  will message social work to see if medical alert bracelet's are affordable. HCM Discussed advanced directives again at follow-up. Will continue to discuss COVID Recommended Tdap- Rx to pharmacy  PCV 20 at follow up   Patient requesting pelvic--will continue to address. No discharge, vaginal bleeding today.     [name], [place]

## 2020-09-06 ENCOUNTER — Telehealth: Payer: Self-pay | Admitting: Family Medicine

## 2020-09-06 LAB — RENAL FUNCTION PANEL
Albumin: 4.4 g/dL (ref 3.7–4.7)
BUN/Creatinine Ratio: 16 (ref 12–28)
BUN: 29 mg/dL — ABNORMAL HIGH (ref 8–27)
CO2: 17 mmol/L — ABNORMAL LOW (ref 20–29)
Calcium: 10.2 mg/dL (ref 8.7–10.3)
Chloride: 103 mmol/L (ref 96–106)
Creatinine, Ser: 1.81 mg/dL — ABNORMAL HIGH (ref 0.57–1.00)
Glucose: 92 mg/dL (ref 65–99)
Phosphorus: 4.2 mg/dL (ref 3.0–4.3)
Potassium: 4.8 mmol/L (ref 3.5–5.2)
Sodium: 138 mmol/L (ref 134–144)
eGFR: 28 mL/min/{1.73_m2} — ABNORMAL LOW (ref >59)

## 2020-09-06 LAB — VITAMIN B12: Vitamin B-12: 428 pg/mL (ref 232–1245)

## 2020-09-06 NOTE — Telephone Encounter (Signed)
[name] calls nurse line requesting lab results from yesterday. [name] updated on recent labs and the need for a repeat on 9/2. [name] will call the day of to let us know when he plans to bring patient, either before or after the podiatry apt.

## 2020-09-06 NOTE — Telephone Encounter (Signed)
Called patient with results--expected results with ARB. B12 normal. Will send letter.  [name], MD  Family Medicine Teaching Service

## 2020-09-09 ENCOUNTER — Ambulatory Visit: Payer: Medicare Other | Admitting: Podiatry

## 2020-09-12 ENCOUNTER — Telehealth: Payer: Self-pay | Admitting: Cardiovascular Disease

## 2020-09-12 DIAGNOSIS — R200 Anesthesia of skin: Secondary | ICD-10-CM

## 2020-09-12 NOTE — Telephone Encounter (Signed)
Pt has been experiencing Numbness in her arms, legs and mouth for 2-3 months. Pt states she called her PCP but was advised to call Heart care because it could be coming from her heart.

## 2020-09-13 NOTE — Telephone Encounter (Signed)
Left a message for the patient to call back.  

## 2020-09-13 NOTE — Telephone Encounter (Signed)
Patient is returning a call from our office.

## 2020-09-13 NOTE — Telephone Encounter (Signed)
Returned call to patient of Dr. C who reports 2-3 months of arm, leg, mouth numbness. She said her PCP told her it could be neuropathy. She hs no reported cardiac concerns. She reports she saw a neurologist about 30 years ago  Patient reports she can eat/swallow, despite mouth numbness She reports she does not have a good appetite  Advised will send a message to Dr. Loletha Grayer to review in case he has a recommendation for who she could follow up with

## 2020-09-13 NOTE — Telephone Encounter (Signed)
Left message to call back  

## 2020-09-13 NOTE — Telephone Encounter (Signed)
Abigail Wallace is returning Abigail Wallace's call. Please advise.

## 2020-09-13 NOTE — Telephone Encounter (Signed)
Croitoru, Abigail Gobble, MD  Fidel Levy, RN Caller: Unspecified (Yesterday, 10:59 AM) No particular Neurologist that I can think of - anyone at [facility] or [facility]. OK to make referral if she requests it.          Spoke with patient - referral to [facility] placed. Advised she call their practice if no call received by early next week

## 2020-09-13 NOTE — Telephone Encounter (Signed)
Patient is returning phone call.  °

## 2020-09-14 ENCOUNTER — Encounter: Payer: Self-pay | Admitting: Neurology

## 2020-09-16 ENCOUNTER — Telehealth: Payer: Self-pay

## 2020-09-16 ENCOUNTER — Other Ambulatory Visit: Payer: Self-pay

## 2020-09-16 ENCOUNTER — Other Ambulatory Visit: Payer: Medicare Other

## 2020-09-16 ENCOUNTER — Ambulatory Visit (INDEPENDENT_AMBULATORY_CARE_PROVIDER_SITE_OTHER): Payer: Medicare Other | Admitting: Podiatry

## 2020-09-16 ENCOUNTER — Other Ambulatory Visit: Payer: Self-pay | Admitting: Family Medicine

## 2020-09-16 DIAGNOSIS — E1169 Type 2 diabetes mellitus with other specified complication: Secondary | ICD-10-CM | POA: Diagnosis not present

## 2020-09-16 DIAGNOSIS — N1832 Chronic kidney disease, stage 3b: Secondary | ICD-10-CM | POA: Diagnosis not present

## 2020-09-16 DIAGNOSIS — B351 Tinea unguium: Secondary | ICD-10-CM | POA: Diagnosis not present

## 2020-09-16 DIAGNOSIS — E1142 Type 2 diabetes mellitus with diabetic polyneuropathy: Secondary | ICD-10-CM

## 2020-09-16 NOTE — Telephone Encounter (Signed)
[name] calls nurse line stating he will bring his mother by this afternoon for repeat labs.   I have added her to the lab schedule. Please place order.

## 2020-09-16 NOTE — Telephone Encounter (Signed)
BMP order placed as future.   [name], MD  Family Medicine Teaching Service

## 2020-09-17 LAB — BASIC METABOLIC PANEL
BUN/Creatinine Ratio: 17 (ref 12–28)
BUN: 28 mg/dL — ABNORMAL HIGH (ref 8–27)
CO2: 18 mmol/L — ABNORMAL LOW (ref 20–29)
Calcium: 9.4 mg/dL (ref 8.7–10.3)
Chloride: 105 mmol/L (ref 96–106)
Creatinine, Ser: 1.63 mg/dL — ABNORMAL HIGH (ref 0.57–1.00)
Glucose: 106 mg/dL — ABNORMAL HIGH (ref 65–99)
Potassium: 4.8 mmol/L (ref 3.5–5.2)
Sodium: 137 mmol/L (ref 134–144)
eGFR: 32 mL/min/{1.73_m2} — ABNORMAL LOW (ref >59)

## 2020-09-20 ENCOUNTER — Telehealth: Payer: Self-pay | Admitting: Family Medicine

## 2020-09-20 ENCOUNTER — Other Ambulatory Visit: Payer: Self-pay | Admitting: Family Medicine

## 2020-09-20 DIAGNOSIS — E119 Type 2 diabetes mellitus without complications: Secondary | ICD-10-CM

## 2020-09-20 NOTE — Progress Notes (Signed)
Patient has a history of type 2 diabetes with neuropathy as well as post polio syndrome.  Patient and son requested referral to a new podiatrist.  Referral placed.

## 2020-09-20 NOTE — Telephone Encounter (Signed)
Called with results--- repeat in 3 months. All questions answered.

## 2020-09-26 NOTE — Progress Notes (Signed)
  Subjective:  Patient ID: Abigail Wallace, female    DOB: 03/17/1945,  MRN: [id number]  Chief Complaint  Patient presents with   Nail Problem    Thick painful toenails, 3 month follow up   Diabetes   75 y.o. female presents with the above complaint. History confirmed with patient.   Objective:  Physical Exam: warm, good capillary refill, nail exam onychomycosis of the toenails, no trophic changes or ulcerative lesions. DP pulses palpable, PT pulses palpable and protective sensation absent. Non-palp PTs. HPK medial arch right  No images are attached to the encounter.  Assessment:   1. Onychomycosis of multiple toenails with type 2 diabetes mellitus and peripheral neuropathy ([place])    Plan:  Patient was evaluated and treated and all questions answered.  Onychomycosis, Diabetes and DPN -Patient is diabetic with a qualifying condition for at risk foot care.  Procedure: Nail Debridement Type of Debridement: manual, sharp debridement. Instrumentation: Nail nipper, rotary burr. Number of Nails: 10  No follow-ups on file.

## 2020-09-27 ENCOUNTER — Ambulatory Visit: Payer: Medicare Other | Admitting: Gastroenterology

## 2020-10-02 ENCOUNTER — Encounter: Payer: Self-pay | Admitting: Family Medicine

## 2020-10-03 ENCOUNTER — Ambulatory Visit (INDEPENDENT_AMBULATORY_CARE_PROVIDER_SITE_OTHER): Payer: Medicare Other

## 2020-10-03 ENCOUNTER — Telehealth: Payer: Self-pay | Admitting: Family Medicine

## 2020-10-03 DIAGNOSIS — I442 Atrioventricular block, complete: Secondary | ICD-10-CM | POA: Diagnosis not present

## 2020-10-03 NOTE — Telephone Encounter (Signed)
Reviewed form and placed in PCP's box for completion.  .Kariel Skillman R Gurpreet Mariani, CMA  

## 2020-10-03 NOTE — Telephone Encounter (Signed)
Reviewed, completed, and signed form.  Note routed to RN team inbasket and placed completed form in Clinic RN's office (wall pocket above desk).  Latorya Bautch M Addison Freimuth, MD   

## 2020-10-03 NOTE — Telephone Encounter (Signed)
HealthCare Provider Verification form dropped off for at front desk for completion.  Verified that patient section of form has been completed.  Last DOS/WCC with PCP was 09/05/20.  Placed form in team folder to be completed by clinical staff.  Creig Hines

## 2020-10-04 ENCOUNTER — Telehealth: Payer: Self-pay | Admitting: Family Medicine

## 2020-10-04 DIAGNOSIS — N3000 Acute cystitis without hematuria: Secondary | ICD-10-CM

## 2020-10-04 MED ORDER — AMOXICILLIN 500 MG PO CAPS: 500.0000 mg | ORAL_CAPSULE | Freq: Two times a day (BID) | ORAL | 0 refills | Status: DC

## 2020-10-04 NOTE — Telephone Encounter (Signed)
Called patient and son Abigail Wallace). Discussed trazodone, trial melatonin instead. BP and BG have been high--Abigail Wallace reports many stressors at home.  He reports most concerning his mom's been having burning with urination and urinary frequency for the past few days.  He did Azo strips at home which were positive.  Discussed dropping off sample versus given classic symptoms empiric treatment.  Given challenges with family will empirically treat.  We reviewed the risks.  The patient has had an adverse reaction to cephalexin in the past.  She has tolerated amoxicillin in the past and her prior culture was sensitive to this.  Thus we will treat with amoxicillin.  500 mg twice daily for renal dosing sent to pharmacy.  All questions answered and reasons to call and return to care were discussed.  For blood pressure discussed increasing metoprolol back to usual dose in the future.  For her blood glucoses Abigail Wallace  to keep an eye on these and decrease the sugary treats at home.  He will call with updates.

## 2020-10-05 ENCOUNTER — Telehealth: Payer: Self-pay | Admitting: Cardiovascular Disease

## 2020-10-05 LAB — CUP PACEART REMOTE DEVICE CHECK
Battery Impedance: 670 Ohm
Battery Remaining Longevity: 67 mo
Battery Voltage: 2.78 V
Brady Statistic AP VP Percent: 72 %
Brady Statistic AP VS Percent: 0 %
Brady Statistic AS VP Percent: 28 %
Brady Statistic AS VS Percent: 0 %
Date Time Interrogation Session: 20220919082925
Implantable Lead Implant Date: 20070414
Implantable Lead Implant Date: 20070914
Implantable Lead Location: 753859
Implantable Lead Location: 753860
Implantable Lead Model: 4092
Implantable Lead Model: 5594
Implantable Pulse Generator Implant Date: 20160816
Lead Channel Impedance Value: 459 Ohm
Lead Channel Impedance Value: 602 Ohm
Lead Channel Pacing Threshold Amplitude: 0.625 V
Lead Channel Pacing Threshold Amplitude: 0.875 V
Lead Channel Pacing Threshold Pulse Width: 0.4 ms
Lead Channel Pacing Threshold Pulse Width: 0.4 ms
Lead Channel Setting Pacing Amplitude: 2 V
Lead Channel Setting Pacing Amplitude: 2.5 V
Lead Channel Setting Pacing Pulse Width: 0.4 ms
Lead Channel Setting Sensing Sensitivity: 4 mV

## 2020-10-05 NOTE — Telephone Encounter (Signed)
New message:     Patient calling to get results for her Device

## 2020-10-05 NOTE — Telephone Encounter (Signed)
Called patient to advise report was sent to Dr. Sallyanne Kuster and once he reviewed, we will call back.

## 2020-10-05 NOTE — Telephone Encounter (Signed)
pts son says that remote pace checker was done Monday and his mom has been in [place] since July 9th.. pts son would like to speak to a nurse in regards to this.. please advise

## 2020-10-05 NOTE — Telephone Encounter (Signed)
Spoke with pt's son Georgena Spurling (ok per [facility]) regarding results from remote pacer check that was done on Monday 9/19. Per chart it looks like the pt called for results yesterday, however results have not been reviewed by physician and pt was told that once Dr. Sallyanne Kuster has reviewed the results that someone would be calling. Pt's son would like for the results to be called to him so that he can explain them to his mom. Son states that pt believes that she has been in afib since July 9th and is concerned that there might be something serious going on and she is not being given the results. Son verbalizes understanding.

## 2020-10-05 NOTE — Telephone Encounter (Signed)
Form faxed and a copy was made for batch scanning.  ?

## 2020-10-06 NOTE — Telephone Encounter (Signed)
Pt son Jori Moll (dpr) wanting to speak to someone regarding 10-03-2020 transmission.

## 2020-10-06 NOTE — Telephone Encounter (Signed)
Patient's son calling indicated concern about the fact that the patient has been in AF.  He states patient was told she was in AF since July 9th, this has made patient very anxious and concerned.  Pt son has called multiple times wanting additional info and more specifically a recommendation on what needs to be done.   Advised that the original report was sent to Dr. Sallyanne Kuster and as of yet, we do not have a recommendation.    Asked patient's son for additional information on pt symptoms.  He states that she is always dizzy and complaining of nausea, these symptoms were present prior to AF.    Advised pt son of concerns with AF ranging from symptomatic, to low BP (he reports pt BP has been up not down).  Pt is on Eliquis.    Pt son requests that he (not the patient) receive a callback from Dr. Sallyanne Kuster to discuss this and what needs to be done.  I advised that I would forward his request.

## 2020-10-07 NOTE — Telephone Encounter (Signed)
The patient's son has been made aware of results. Appointment made for 9/26 with Dr. Sallyanne Kuster.

## 2020-10-10 ENCOUNTER — Other Ambulatory Visit: Payer: Self-pay

## 2020-10-10 ENCOUNTER — Encounter: Payer: Self-pay | Admitting: Cardiovascular Disease

## 2020-10-10 ENCOUNTER — Encounter: Payer: Medicare Other | Admitting: Cardiovascular Disease

## 2020-10-10 ENCOUNTER — Ambulatory Visit (INDEPENDENT_AMBULATORY_CARE_PROVIDER_SITE_OTHER): Payer: Medicare Other | Admitting: Cardiovascular Disease

## 2020-10-10 VITALS — BP 112/70 | HR 70 | Ht 66.0 in | Wt 268.0 lb

## 2020-10-10 DIAGNOSIS — I480 Paroxysmal atrial fibrillation: Secondary | ICD-10-CM | POA: Diagnosis not present

## 2020-10-10 DIAGNOSIS — I495 Sick sinus syndrome: Secondary | ICD-10-CM | POA: Diagnosis not present

## 2020-10-10 DIAGNOSIS — I10 Essential (primary) hypertension: Secondary | ICD-10-CM

## 2020-10-10 DIAGNOSIS — Z950 Presence of cardiac pacemaker: Secondary | ICD-10-CM | POA: Diagnosis not present

## 2020-10-10 MED ORDER — AMLODIPINE BESYLATE 5 MG PO TABS: 5.0000 mg | ORAL_TABLET | Freq: Every day | ORAL | 3 refills | Status: DC

## 2020-10-10 NOTE — Progress Notes (Signed)
Remote pacemaker transmission.   

## 2020-10-10 NOTE — Patient Instructions (Signed)
Medication Instructions:  REDUCE: AMLODIPINE to 5mg  DAILY- NEW PRESCRIPTION SENT TO PHARMACY  *If you need a refill on your cardiac medications before your next appointment, please call your pharmacy*  Testing/Procedures: Your physician has requested that you have an echocardiogram. Echocardiography is a painless test that uses sound waves to create images of your heart. It provides your doctor with information about the size and shape of your heart and how well your heart's chambers and valves are working. You may receive an ultrasound enhancing agent through an IV if needed to better visualize your heart during the echo.This procedure takes approximately one hour. There are no restrictions for this procedure. This will take place at the [address].   Follow-Up: At [facility], you and your health needs are our priority.  As part of our continuing mission to provide you with exceptional heart care, we have created designated Provider Care Teams.  These Care Teams include your primary Cardiologist (physician) and Advanced Practice Providers (APPs -  Physician Assistants and Nurse Practitioners) who all work together to provide you with the care you need, when you need it.  Your next appointment:   6 month(s)  The format for your next appointment:   In Person  Provider:   Sanda Klein, MD

## 2020-10-10 NOTE — Progress Notes (Signed)
Cardiology Office Note:   Date:  10/10/2020  NAME:  Undine Nealis    MRN: [id number] DOB:  [birth year]   PCP:  Martyn Malay, MD  Cardiologist:  Sanda Klein, MD  Electrophysiologist:  None   Referring MD: Martyn Malay, MD   Chief Complaint  Patient presents with   Follow-up    History of Present Illness:   Diva Lemberger is a 75 y.o. female with a hx of paroxysmal atrial fibrillation, sick sinus syndrome status post pacemaker implantation, obesity, sleep apnea, tobacco abuse who presents for follow-up.  She presents with her son.  He has concerns that she has been in A. fib.  I did review her recent pacemaker interrogation.  Shows that she has been in A. fib.  She is in complete heart block and appears to be paced 100% of the time.  EKG in office demonstrates A. fib with V paced rhythm heart rate in the 70s.  He reports that they recently added on losartan to her blood pressure regimen.  BPs are low between 834-196 systolically.  He reports she is a little sluggish.  He also reports that she has dry mouth and smacks her lips a lot.  She is on Zyprexa as well as Depakote.  I suspect that is related to this.  She has no focal deficits on my examination today.  She has no significant shortness of breath.  She is currently bedridden.  She is still smoking cigarettes daily.  She uses her wheelchair to be able to smoke cigarettes.  Her son is quite concerned about her A. fib.  I did inform him that she is protected from stroke on Eliquis.  We have plans to repeat an echocardiogram to make sure nothing is changed but she also appears stable on exam today.  Denies any significant chest pain symptoms.  Problem List SSS s/p ppm Paroxysmal Afib HLD AAA OSA Tobacco abuse  DM -A1c 6.3  Past Medical History: Past Medical History:  Diagnosis Date   Abdominal wall hernia 01/2017   Atrial fibrillation (HCC)    CHF (congestive heart failure) (HCC)    CKD (chronic kidney disease)    COPD  (chronic obstructive pulmonary disease) (HCC)    Coronary artery disease    NON-CRITICAL   Ectasis aorta (HCC)    GERD (gastroesophageal reflux disease)    Gout    Hypertension    Memory difficulties 12/02/2013   Psychosis ([place])    Tardive dyskinesia 12/02/2013   Type 2 diabetes mellitus ([place])     Past Surgical History: Past Surgical History:  Procedure Laterality Date   CARDIAC CATHETERIZATION  2007   Non-critical.    CHOLECYSTECTOMY     ELBOW SURGERY     EP IMPLANTABLE DEVICE N/A 08/31/2014   Procedure: PPM Generator Changeout;  Surgeon: Sanda Klein, MD;  Location: [facility];  Service: Cardiovascular;  Laterality: N/A;   INSERT / REPLACE / REMOVE PACEMAKER  2007   [place]/SYMPTOMATIC HEART BLOCK   PACEMAKER PLACEMENT  09/28/2005   2/2 SSS?   Persantine stress test  05/01/2010   EF 66%. Normal LV sys fx. Unchanged from previous studies.    TRANSTHORACIC ECHOCARDIOGRAM  09/08/10   SEVERE CONCENTRIC HYPERTROPHY.LV FUNCTION WAS VIGOROUS.EF 65%-70%.VENTRICULAR SEPTUM-INCOORDINATE MOTION.LEFT ATRIUM-MILDLY DILATED.TRIVIAL TR.   TUBAL LIGATION      Current Medications: Current Meds  Medication Sig   acetaminophen (TYLENOL) 500 MG tablet Take 1,000 mg by mouth every 6 (six) hours as needed  for moderate pain.    albuterol (PROVENTIL) (2.5 MG/3ML) 0.083% nebulizer solution Take 3 mLs (2.5 mg total) by nebulization every 6 (six) hours as needed for wheezing or shortness of breath.   albuterol (VENTOLIN HFA) 108 (90 Base) MCG/ACT inhaler Inhale 2 puffs into the lungs every 6 (six) hours as needed for wheezing or shortness of breath.   allopurinol (ZYLOPRIM) 100 MG tablet Take 1 tablet (100 mg total) by mouth daily.   amoxicillin (AMOXIL) 500 MG capsule Take 1 capsule (500 mg total) by mouth 2 (two) times daily.   ANORO ELLIPTA 62.5-25 MCG/INH AEPB Inhale 1 puff into the lungs daily.   apixaban (ELIQUIS) 5 MG TABS tablet Take 1 tablet (5 mg total) by mouth 2 (two) times  daily.   Blood Glucose Monitoring Suppl (ONETOUCH VERIO) w/Device KIT Check blood sugar once daily   cholecalciferol (VITAMIN D3) 25 MCG (1000 UNIT) tablet Take 1 tablet (1,000 Units total) by mouth daily.   diclofenac Sodium (VOLTAREN) 1 % GEL Apply 2 g topically 4 (four) times daily as needed (pain).   divalproex (DEPAKOTE ER) 250 MG 24 hr tablet Take 250 mg by mouth at bedtime.    docusate sodium (COLACE) 100 MG capsule Take 1 capsule (100 mg total) by mouth 2 (two) times daily.   glucose blood (ONETOUCH VERIO) test strip Please use to check blood sugar once daily. E11.9   glucose blood test strip Test each morning before breakfast   Incontinence Supplies MISC 1 Units by Does not apply route as needed.   Lancet Device MISC 1 Device by Does not apply route 3 (three) times a week.   linagliptin (TRADJENTA) 5 MG TABS tablet Take 1 tablet (5 mg total) by mouth daily.   metoprolol tartrate (LOPRESSOR) 50 MG tablet Take 2 tablets (100 mg total) by mouth 2 (two) times daily.   mupirocin ointment (BACTROBAN) 2 % Apply 1 application topically 2 (two) times daily. To affected area   nitroGLYCERIN (NITROSTAT) 0.4 MG SL tablet PLACE 1 TABLET (0.4 MG TOTAL) UNDER THE TONGUE EVERY 5 MINUTES FOR THREE DOSES AS NEEDED. FOR CHEST PAIN CALL 911 AFTER THAT   nystatin cream (MYCOSTATIN) Apply 1 application topically 2 (two) times daily as needed for dry skin. Use in skin folds for rash   OLANZapine (ZYPREXA) 10 MG tablet Take 10 mg by mouth at bedtime.   olmesartan (BENICAR) 20 MG tablet Take 20 mg by mouth daily.   OneTouch Delica Lancets 59D MISC Use to test once daily.  DX code:E11.9   pravastatin (PRAVACHOL) 40 MG tablet Take 1 tablet (40 mg total) by mouth every evening.   senna (SENOKOT) 8.6 MG TABS tablet Take 1 tablet (8.6 mg total) by mouth daily as needed for mild constipation.   sodium bicarbonate 650 MG tablet Take 1 tablet (650 mg total) by mouth 2 (two) times daily.   [DISCONTINUED] amLODipine  (NORVASC) 10 MG tablet Take 1 tablet (10 mg total) by mouth daily.     Allergies:    Abilify [aripiprazole], Clozapine, Benadryl [diphenhydramine hcl], Cogentin [benztropine], Haloperidol lactate, Latuda [lurasidone hcl], Lorazepam, Remeron [mirtazapine], Keflex [cephalexin], Codeine, and Latex   Social History: Social History   Socioeconomic History   Marital status: Divorced    Spouse name: Not on file   Number of children: Not on file   Years of education: Not on file   Highest education level: Not on file  Occupational History   Not on file  Tobacco Use   Smoking  status: Every Day    Packs/day: 0.25    Types: Cigarettes   Smokeless tobacco: Never   Tobacco comments:    6 cigs a day  Vaping Use   Vaping Use: Some days  Substance and Sexual Activity   Alcohol use: No    Alcohol/week: 0.0 standard drinks   Drug use: No   Sexual activity: Not on file  Other Topics Concern   Not on file  Social History Narrative   Lives alone. Son brings to appointments ([name])    Children: 2 daughters, 1 son; 26 GC, 5 GGC.    Occupation: disabled.    Caffeine:   Tobacco: 5-6/day   Denies alcohol.    Social Determinants of Health   Financial Resource Strain: Not on file  Food Insecurity: Not on file  Transportation Needs: Not on file  Physical Activity: Not on file  Stress: Not on file  Social Connections: Not on file     Family History: The patient's family history includes Diabetes type II in her son; Heart disease in her father and mother; Stroke in her sister. There is no history of Colon cancer.  ROS:   All other ROS reviewed and negative. Pertinent positives noted in the HPI.     EKGs/Labs/Other Studies Reviewed:   The following studies were personally reviewed by me today:  EKG:  EKG is  ordered today.  The ekg ordered today demonstrates A. fib with V paced rhythm heart rate 70, and was personally reviewed by me.   Recent Labs: 05/30/2020: ALT 9; Hemoglobin 13.0;  Platelets 280 09/16/2020: BUN 28; Creatinine, Ser 1.63; Potassium 4.8; Sodium 137   Recent Lipid Panel    Component Value Date/Time   CHOL 155 05/30/2020 1401   TRIG 218 (H) 05/30/2020 1401   HDL 42 05/30/2020 1401   CHOLHDL 3.7 05/30/2020 1401   CHOLHDL 2.7 11/19/2010 0605   VLDL 15 11/19/2010 0605   LDLCALC 77 05/30/2020 1401   LDLDIRECT 80 03/20/2012 1510    Physical Exam:   VS:  BP 112/70   Pulse 70   Ht '5\' 6"'  (1.676 m)   Wt 268 lb (121.6 kg)   SpO2 98%   BMI 43.26 kg/m    Wt Readings from Last 3 Encounters:  10/10/20 268 lb (121.6 kg)  09/05/20 270 lb 12.8 oz (122.8 kg)  05/23/20 259 lb (117.5 kg)    General: Well nourished, well developed, in no acute distress Head: Atraumatic, normal size  Eyes: PEERLA, EOMI  Neck: Supple, no JVD Endocrine: No thryomegaly Cardiac: Normal S1, S2; RRR; no murmurs, rubs, or gallops Lungs: Clear to auscultation bilaterally, no wheezing, rhonchi or rales  Abd: Soft, nontender, no hepatomegaly  Ext: No edema, pulses 2+ Musculoskeletal: No deformities, BUE and BLE strength normal and equal Skin: Warm and dry, no rashes   Neuro: Alert and oriented to person, place, time, and situation, CNII-XII grossly intact, no focal deficits  Psych: Normal mood and affect   ASSESSMENT:   Lavida Patch is a 75 y.o. female who presents for the following: 1. Paroxysmal atrial fibrillation (HCC)   2. Sick sinus syndrome ([place])   3. Pacemaker   4. Primary hypertension     PLAN:   1. Paroxysmal atrial fibrillation (HCC) 2. Sick sinus syndrome ([place]) 3. Pacemaker -Presents with a evaluation with concerns of being in A. fib.  She has been in A. fib for several months.  She is V paced.  She is in complete heart block.  She is dependent on her pacemaker.  I did counsel the patient and her son on this.  There is no harm to remain in A. fib if she is paced.  Due to complete heart block it should not cause her any issues.  She is on Eliquis and tolerating  this well.  No significant bleeding.  I do not believe her symptoms are related to this.  From a cardiovascular standpoint she appears stable.  We will repeat an echocardiogram just to make sure nothing is changed.  We will forward the results to Dr. Sallyanne Kuster.  4. Primary hypertension -He does report some sluggishness as well as sleepiness during the day.  They have recently added Benicar to her blood pressure regimen.  BP is likely a bit too low.  We will reduce amlodipine by 5 mg daily.  Hopefully this will help.  I have also asked him to have his mother reevaluated by psychiatry.  She does have Zyprexa as well as Depakote on her medication list.  I wonder if these could be contributing.  We will see how she does with lowering her blood pressure medication.  Disposition: Return in about 6 months (around 04/09/2021).  Medication Adjustments/Labs and Tests Ordered: Current medicines are reviewed at length with the patient today.  Concerns regarding medicines are outlined above.  Orders Placed This Encounter  Procedures   EKG 12-Lead   ECHOCARDIOGRAM COMPLETE   Meds ordered this encounter  Medications   amLODipine (NORVASC) 5 MG tablet    Sig: Take 1 tablet (5 mg total) by mouth daily.    Dispense:  90 tablet    Refill:  3    Patient Instructions  Medication Instructions:  REDUCE: AMLODIPINE to 63m DAILY- NEW PRESCRIPTION SENT TO PHARMACY  *If you need a refill on your cardiac medications before your next appointment, please call your pharmacy*  Testing/Procedures: Your physician has requested that you have an echocardiogram. Echocardiography is a painless test that uses sound waves to create images of your heart. It provides your doctor with information about the size and shape of your heart and how well your heart's chambers and valves are working. You may receive an ultrasound enhancing agent through an IV if needed to better visualize your heart during the echo.This procedure takes  approximately one hour. There are no restrictions for this procedure. This will take place at the [address].   Follow-Up: [facility] you and your health needs are our priority.  As part of our continuing mission to provide you with exceptional heart care, we have created designated Provider Care Teams.  These Care Teams include your primary Cardiologist (physician) and Advanced Practice Providers (APPs -  Physician Assistants and Nurse Practitioners) who all work together to provide you with the care you need, when you need it.  Your next appointment:   6 month(s)  The format for your next appointment:   In Person  Provider:   MSanda Klein MD   Time Spent with Patient: I have spent a total of 35 minutes with patient reviewing hospital notes, telemetry, EKGs, labs and examining the patient as well as establishing an assessment and plan that was discussed with the patient.  > 50% of time was spent in direct patient care.  Signed, WAddison Naegeli OAudie Box MD, [place] [address] [place] [phone] 10/10/2020 12:03 PM

## 2020-10-17 ENCOUNTER — Other Ambulatory Visit: Payer: Self-pay | Admitting: Cardiovascular Disease

## 2020-10-19 ENCOUNTER — Telehealth: Payer: Self-pay | Admitting: Family Medicine

## 2020-10-19 NOTE — Telephone Encounter (Signed)
Spoke with son. He had some questions about mother's pacer and coronary disease. Discussed reason for pacemaker vs. Treatment of CAD. He will schedule echocardiogram. PCP visit scheduled.   [name], MD  Family Medicine Teaching Service

## 2020-10-19 NOTE — Telephone Encounter (Signed)
Called patient to schedule--will try son to schedule. Front to reschedule with son.  [name], MD  Family Medicine Teaching Service

## 2020-11-03 ENCOUNTER — Encounter: Payer: Self-pay | Admitting: Family Medicine

## 2020-11-05 ENCOUNTER — Other Ambulatory Visit: Payer: Self-pay | Admitting: Family Medicine

## 2020-11-05 ENCOUNTER — Telehealth: Payer: Self-pay | Admitting: Family Medicine

## 2020-11-05 ENCOUNTER — Other Ambulatory Visit: Payer: Self-pay | Admitting: Cardiovascular Disease

## 2020-11-05 ENCOUNTER — Other Ambulatory Visit: Payer: Self-pay

## 2020-11-05 DIAGNOSIS — N1832 Chronic kidney disease, stage 3b: Secondary | ICD-10-CM

## 2020-11-05 NOTE — Telephone Encounter (Signed)
Called son about change in mouth quivering, now improved. Discussed that with renal impairment, could be change in function. CMP ordered as future, lab visit Wednesday PM.  [name], MD  Family Medicine Teaching Service

## 2020-11-07 ENCOUNTER — Ambulatory Visit (HOSPITAL_COMMUNITY): Payer: Medicare Other

## 2020-11-09 ENCOUNTER — Other Ambulatory Visit: Payer: Medicare Other

## 2020-11-10 ENCOUNTER — Telehealth: Payer: Self-pay | Admitting: Family Medicine

## 2020-11-10 NOTE — Telephone Encounter (Signed)
Called patient and she will need to be out of appointment within an hour.  She has an Echo scheduled for 1301 on the same day.  Abigail Wallace, [place]

## 2020-11-10 NOTE — Telephone Encounter (Signed)
Please let patient know it is fine to wait unless symptoms (mouth twitching) changes.  Thank you, [name], MD  [facility] Medicine Teaching Service

## 2020-11-10 NOTE — Telephone Encounter (Signed)
Patient is calling asking if she can just wait to do the lab work on the day of her appointment because it works best for her.

## 2020-11-21 ENCOUNTER — Encounter: Payer: Self-pay | Admitting: Neurology

## 2020-11-21 ENCOUNTER — Other Ambulatory Visit: Payer: Self-pay

## 2020-11-21 ENCOUNTER — Other Ambulatory Visit (INDEPENDENT_AMBULATORY_CARE_PROVIDER_SITE_OTHER): Payer: Medicare Other

## 2020-11-21 ENCOUNTER — Ambulatory Visit (INDEPENDENT_AMBULATORY_CARE_PROVIDER_SITE_OTHER): Payer: Medicare Other | Admitting: Neurology

## 2020-11-21 VITALS — BP 144/80 | HR 73 | Ht 66.0 in | Wt 265.0 lb

## 2020-11-21 DIAGNOSIS — G5623 Lesion of ulnar nerve, bilateral upper limbs: Secondary | ICD-10-CM

## 2020-11-21 DIAGNOSIS — R202 Paresthesia of skin: Secondary | ICD-10-CM

## 2020-11-21 DIAGNOSIS — E1142 Type 2 diabetes mellitus with diabetic polyneuropathy: Secondary | ICD-10-CM

## 2020-11-21 LAB — FOLATE: Folate: 10.4 ng/mL (ref >5.9)

## 2020-11-21 NOTE — Progress Notes (Signed)
[facility] Clinic Note - Initial Visit   Date: 11/21/20  Abigail Wallace MRN: [id number] DOB: [birth year]   Dear Dr. Sallyanne Kuster:  Thank you for your kind referral of [facility] for consultation of numbness/tingling. Although her history is well known to you, please allow [place] to reiterate it for the purpose of our medical record. The patient was accompanied to the clinic by son who also provides collateral information.     History of Present Illness: Abigail Wallace is a 75 y.o. right-handed female with well-controlled diabetes mellitus, atrial fibrllation, CHF, COPD, hypertension, GERD, bipolar disease, and tardive dyskinesia presenting for evaluation of numbness/tingling of the arms and feet.  For the past 2 years, she has tingling and numbness of the feet. Symptoms are constant, no exacerbating or alleviating factors.  She is very sedentary and essentially wheelchair-bound and dependent on ADLs such as bathing, dressing, and transfer. Her son is primary caregiver. She has history of polio which resulted in leg length discrepancy and chronic hip pain, because of this, she has fear of walking and prefers to stay in her chair.  She also complains of tingling in her forearm and hands.  She denies tongue numbness and reports mores of tongue movements. She has known history of tardive dyskinesia.  Out-side paper records, electronic medical record, and images have been reviewed where available and summarized as:  Lab Results  Component Value Date   HGBA1C 6.3 09/05/2020   Lab Results  Component Value Date   YIFOYDXA12 878 09/05/2020   Lab Results  Component Value Date   TSH 2.500 01/26/2019   Lab Results  Component Value Date   ESRSEDRATE 7 02/17/2010   POCTSEDRATE 10 09/26/2011    Past Medical History:  Diagnosis Date   Abdominal wall hernia 01/2017   Atrial fibrillation (HCC)    CHF (congestive heart failure) ([place])    CKD (chronic kidney disease)     COPD (chronic obstructive pulmonary disease) ([place])    Coronary artery disease    NON-CRITICAL   Ectasis aorta ([place])    GERD (gastroesophageal reflux disease)    Gout    Hypertension    Memory difficulties 12/02/2013   Psychosis ([place])    Tardive dyskinesia 12/02/2013   Type 2 diabetes mellitus ([place])     Past Surgical History:  Procedure Laterality Date   CARDIAC CATHETERIZATION  2007   Non-critical.    CHOLECYSTECTOMY     ELBOW SURGERY     EP IMPLANTABLE DEVICE N/A 08/31/2014   Procedure: PPM Generator Changeout;  Surgeon: Sanda Klein, MD;  Location: [place] [facility];  Service: Cardiovascular;  Laterality: N/A;   INSERT / REPLACE / REMOVE PACEMAKER  2007   [place]/SYMPTOMATIC HEART BLOCK   PACEMAKER PLACEMENT  09/28/2005   2/2 SSS?   Persantine stress test  05/01/2010   EF 66%. Normal LV sys fx. Unchanged from previous studies.    TRANSTHORACIC ECHOCARDIOGRAM  09/08/10   SEVERE CONCENTRIC HYPERTROPHY.LV FUNCTION WAS VIGOROUS.EF 65%-70%.VENTRICULAR SEPTUM-INCOORDINATE MOTION.LEFT ATRIUM-MILDLY DILATED.TRIVIAL TR.   TUBAL LIGATION       Medications:  Outpatient Encounter Medications as of 11/21/2020  Medication Sig   acetaminophen (TYLENOL) 500 MG tablet Take 1,000 mg by mouth every 6 (six) hours as needed for moderate pain.    albuterol (PROVENTIL) (2.5 MG/3ML) 0.083% nebulizer solution Take 3 mLs (2.5 mg total) by nebulization every 6 (six) hours as needed for wheezing or shortness of breath.   albuterol (VENTOLIN HFA) 108 (90  Base) MCG/ACT inhaler Inhale 2 puffs into the lungs every 6 (six) hours as needed for wheezing or shortness of breath.   allopurinol (ZYLOPRIM) 100 MG tablet Take 1 tablet (100 mg total) by mouth daily.   amLODipine (NORVASC) 5 MG tablet Take 1 tablet (5 mg total) by mouth daily.   apixaban (ELIQUIS) 5 MG TABS tablet Take 1 tablet (5 mg total) by mouth 2 (two) times daily.   Blood Glucose Monitoring Suppl (ONETOUCH VERIO) w/Device KIT Check  blood sugar once daily   cholecalciferol (VITAMIN D3) 25 MCG (1000 UNIT) tablet Take 1 tablet (1,000 Units total) by mouth daily.   diclofenac Sodium (VOLTAREN) 1 % GEL Apply 2 g topically 4 (four) times daily as needed (pain).   divalproex (DEPAKOTE ER) 250 MG 24 hr tablet Take 250 mg by mouth at bedtime.    docusate sodium (COLACE) 100 MG capsule Take 1 capsule (100 mg total) by mouth 2 (two) times daily.   glucose blood (ONETOUCH VERIO) test strip Please use to check blood sugar once daily. E11.9   glucose blood test strip Test each morning before breakfast   Incontinence Supplies MISC 1 Units by Does not apply route as needed.   Lancet Device MISC 1 Device by Does not apply route 3 (three) times a week.   linagliptin (TRADJENTA) 5 MG TABS tablet Take 1 tablet (5 mg total) by mouth daily.   metoprolol tartrate (LOPRESSOR) 50 MG tablet Take 2 tablets (100 mg total) by mouth 2 (two) times daily. (Patient taking differently: Take 100 mg by mouth. Take 1 tablet in the morning and 2 tablets at bedtime)   mupirocin ointment (BACTROBAN) 2 % Apply 1 application topically 2 (two) times daily. To affected area   nitroGLYCERIN (NITROSTAT) 0.4 MG SL tablet PLACE ONE TABLET UNDER THE TONGUE EVERY FIVE MINUTES FOR THREE DOSES AS NEEDED FOR CHEST PAIN. CALL 911 AFTER THAT   nystatin cream (MYCOSTATIN) Apply 1 application topically 2 (two) times daily as needed for dry skin. Use in skin folds for rash   OLANZapine (ZYPREXA) 10 MG tablet Take 10 mg by mouth at bedtime.   olmesartan (BENICAR) 20 MG tablet Take 20 mg by mouth daily.   OneTouch Delica Lancets 53G MISC Use to test once daily.  DX code:E11.9   pravastatin (PRAVACHOL) 40 MG tablet TAKE ONE TABLET BY MOUTH EVERY EVENING   senna (SENOKOT) 8.6 MG TABS tablet Take 1 tablet (8.6 mg total) by mouth daily as needed for mild constipation.   sodium bicarbonate 650 MG tablet Take 1 tablet (650 mg total) by mouth 2 (two) times daily.   [DISCONTINUED]  amoxicillin (AMOXIL) 500 MG capsule Take 1 capsule (500 mg total) by mouth 2 (two) times daily. (Patient not taking: Reported on 11/21/2020)   [DISCONTINUED] ANORO ELLIPTA 62.5-25 MCG/INH AEPB Inhale 1 puff into the lungs daily. (Patient not taking: Reported on 11/21/2020)   No facility-administered encounter medications on file as of 11/21/2020.    Allergies:  Allergies  Allergen Reactions   Abilify [Aripiprazole] Anaphylaxis, Shortness Of Breath and Other (See Comments)    Tremors   Clozapine Anaphylaxis   Benadryl [Diphenhydramine Hcl] Other (See Comments)    States affects her vision   Cogentin [Benztropine] Other (See Comments)    Affects mobility   Haloperidol Lactate Other (See Comments)    Vision changes, Shaking, Drooling   Latuda [Lurasidone Hcl] Other (See Comments)    "Tremors and shakes."    Lorazepam     Other  reaction(s): Unknown   Remeron [Mirtazapine] Other (See Comments)    Tremors and shakes   Keflex [Cephalexin]     Per patient, has tongue swelling with keflex but can tolerate amoxicillin   Codeine Other (See Comments)    Unknown    Latex Rash    Family History: Family History  Problem Relation Age of Onset   Heart disease Mother    Heart disease Father    Stroke Sister    Diabetes type II Son    Colon cancer Neg Hx     Social History: Social History   Tobacco Use   Smoking status: Every Day    Packs/day: 0.25    Types: Cigarettes   Smokeless tobacco: Never   Tobacco comments:    6 cigs a day  Vaping Use   Vaping Use: Some days  Substance Use Topics   Alcohol use: No    Alcohol/week: 0.0 standard drinks   Drug use: No   Social History   Social History Narrative   Lives alone. Son brings to appointments ([name])    Children: 2 daughters, 1 son; 82 GC, 5 GGC.    Occupation: disabled.    Caffeine:   Tobacco: 5-6/day   Denies alcohol.       Lives in a one story home in apartment complex   Right Handed     Vital Signs:  BP (!) 144/80    Pulse 73   Ht _0  (1.676 m)   Wt 265 lb (120.2 kg)   SpO2 95%   BMI 42.77 kg/m    Neurological Exam: MENTAL STATUS including orientation to time, place, person, recent and remote memory, attention span and concentration, language, and fund of knowledge is fair.  Speech is mildly dysarthric, edenulous.  CRANIAL NERVES: II:  No visual field defects.    III-IV-VI: Pupils equal round and reactive to light.  Normal conjugate, extra-ocular eye movements in all directions of gaze.  No nystagmus.  No ptosis.   V:  Normal facial sensation.    VII:  Normal facial symmetry.Involuntary oral movements (tardive dyskinesia) VIII:  Normal hearing and vestibular function.   IX-X:  Normal palatal movement.   XI:  Normal shoulder shrug and head rotation.   XII:  Normal tongue strength and range of motion, no deviation or fasciculation.  MOTOR:  No atrophy, fasciculations or abnormal movements.  No pronator drift.   Upper Extremity:  Right  Left  Deltoid  5/5   5/5   Biceps  5/5   5/5   Triceps  5/5   5/5   Infraspinatus 5/5  5/5  Medial pectoralis 5/5  5/5  Wrist extensors  5/5   5/5   Wrist flexors  5/5   5/5   Finger extensors  5/5   5/5   Finger flexors  5/5   5/5   Dorsal interossei  4/5   4/5   Abductor pollicis  5/5   5/5   Tone (Ashworth scale)  0  0   Lower Extremity:  Right  Left  Hip flexors  5/5   5/5   Hip extensors  5/5   5/5   Adductor 5/5  5/5  Abductor 5/5  5/5  Knee flexors  5/5   5/5   Knee extensors  5/5   5/5   Dorsiflexors  5/5   5/5   Plantarflexors  5/5   5/5   Toe extensors  5/5   5/5   Toe flexors  5/5   5/5   Tone (Ashworth scale)  0  0   MSRs:  Right        Left                  brachioradialis 1+  1+  biceps 1+  1+  triceps 1+  1+  patellar 1+  1+  ankle jerk 0  0  Hoffman no  no  plantar response down  down   SENSORY:  Diminished vibration, temperature, and pinprick in the feet. Sensation intact in the arms.  COORDINATION/GAIT: Normal  finger-to- nose-finger.  Finger tapping intact. Patient in wheelchair and does not walk, so gait not tested.   IMPRESSION: Peripheral neuropathy affecting the feet Probable bilateral ulnar neuropathy at the elbow Wheelchair bound due to hip pain and fear of walking. Lower body strength is intact   PLAN/RECOMMENDATIONS:  Check folate, copper, SPEP with IFE NCS/EMG declined Avoid leaning on elbows Encouraged to do home exercises and try to stay active.   Further recommendations pending results.   Thank you for allowing me to participate in patient's care.  If I can answer any additional questions, I would be pleased to do so.    Sincerely,    Treniece Holsclaw K. Posey Pronto, DO

## 2020-11-21 NOTE — Patient Instructions (Addendum)
Avoid leaning your elbows  Encouraged to do home exercises  Check labs

## 2020-11-22 ENCOUNTER — Telehealth: Payer: Self-pay

## 2020-11-22 DIAGNOSIS — J449 Chronic obstructive pulmonary disease, unspecified: Secondary | ICD-10-CM

## 2020-11-22 NOTE — Telephone Encounter (Signed)
See below message from [employer].   I'll get on this! Thank you!  Talbot Grumbling, RN

## 2020-11-22 NOTE — Telephone Encounter (Signed)
Community message sent to [employer]. Will await response.   Jesyka Slaght C Shyteria Lewis, RN  

## 2020-11-22 NOTE — Telephone Encounter (Signed)
Patient calls nurse line requesting DME orders for nebulizer tubing and face mask.   Will forward to PCP. Please route back to RN team once completed.   Thanks.   Talbot Grumbling, RN

## 2020-11-22 NOTE — Telephone Encounter (Signed)
DME order placed.  Routing to [employer] team.

## 2020-11-25 LAB — PROTEIN ELECTROPHORESIS, SERUM
Albumin ELP: 3.8 g/dL (ref 3.8–4.8)
Alpha 1: 0.3 g/dL (ref 0.2–0.3)
Alpha 2: 0.8 g/dL (ref 0.5–0.9)
Beta 2: 0.3 g/dL (ref 0.2–0.5)
Beta Globulin: 0.5 g/dL (ref 0.4–0.6)
Gamma Globulin: 0.9 g/dL (ref 0.8–1.7)
Total Protein: 6.6 g/dL (ref 6.1–8.1)

## 2020-11-25 LAB — COPPER, SERUM: Copper: 97 ug/dL (ref 70–175)

## 2020-11-25 LAB — IMMUNOFIXATION ELECTROPHORESIS
IgG (Immunoglobin G), Serum: 982 mg/dL (ref 600–1540)
IgM, Serum: 98 mg/dL (ref 50–300)
Immunofix Electr Int: NOT DETECTED
Immunoglobulin A: 128 mg/dL (ref 70–320)

## 2020-11-28 ENCOUNTER — Ambulatory Visit (HOSPITAL_COMMUNITY): Payer: Medicare Other | Attending: Internal Medicine

## 2020-11-28 ENCOUNTER — Ambulatory Visit (INDEPENDENT_AMBULATORY_CARE_PROVIDER_SITE_OTHER): Payer: Medicare Other | Admitting: Family Medicine

## 2020-11-28 ENCOUNTER — Encounter: Payer: Self-pay | Admitting: Family Medicine

## 2020-11-28 ENCOUNTER — Other Ambulatory Visit: Payer: Self-pay

## 2020-11-28 VITALS — BP 123/62 | HR 70

## 2020-11-28 DIAGNOSIS — I10 Essential (primary) hypertension: Secondary | ICD-10-CM | POA: Diagnosis not present

## 2020-11-28 DIAGNOSIS — L304 Erythema intertrigo: Secondary | ICD-10-CM | POA: Diagnosis not present

## 2020-11-28 DIAGNOSIS — R300 Dysuria: Secondary | ICD-10-CM | POA: Diagnosis not present

## 2020-11-28 DIAGNOSIS — E119 Type 2 diabetes mellitus without complications: Secondary | ICD-10-CM

## 2020-11-28 DIAGNOSIS — I480 Paroxysmal atrial fibrillation: Secondary | ICD-10-CM | POA: Diagnosis not present

## 2020-11-28 DIAGNOSIS — N1832 Chronic kidney disease, stage 3b: Secondary | ICD-10-CM

## 2020-11-28 DIAGNOSIS — F17200 Nicotine dependence, unspecified, uncomplicated: Secondary | ICD-10-CM | POA: Diagnosis not present

## 2020-11-28 DIAGNOSIS — F200 Paranoid schizophrenia: Secondary | ICD-10-CM | POA: Diagnosis not present

## 2020-11-28 LAB — POCT GLYCOSYLATED HEMOGLOBIN (HGB A1C): HbA1c, POC (prediabetic range): 6.2 % (ref 5.7–6.4)

## 2020-11-28 LAB — POCT URINALYSIS DIP (MANUAL ENTRY)
Bilirubin, UA: NEGATIVE
Blood, UA: NEGATIVE
Glucose, UA: 100 mg/dL — AB
Ketones, POC UA: NEGATIVE mg/dL
Leukocytes, UA: NEGATIVE
Nitrite, UA: NEGATIVE
Protein Ur, POC: >=300 mg/dL — AB
Spec Grav, UA: 1.02 (ref 1.010–1.025)
Urobilinogen, UA: 0.2 E.U./dL
pH, UA: 6 (ref 5.0–8.0)

## 2020-11-28 LAB — ECHOCARDIOGRAM COMPLETE: S' Lateral: 2.9 cm

## 2020-11-28 MED ORDER — NYSTATIN 100000 UNIT/GM EX CREA: 1.0000 "application " | TOPICAL_CREAM | Freq: Two times a day (BID) | CUTANEOUS | 0 refills | Status: DC

## 2020-11-28 MED ORDER — HYDROCORTISONE 0.5 % EX CREA: 1.0000 "application " | TOPICAL_CREAM | Freq: Two times a day (BID) | CUTANEOUS | 0 refills | Status: DC

## 2020-11-28 MED ORDER — PERFLUTREN LIPID MICROSPHERE
1.0000 mL | INTRAVENOUS | Status: AC | PRN
  Administered 2020-11-28: 2 mL via INTRAVENOUS

## 2020-11-28 NOTE — Progress Notes (Signed)
SUBJECTIVE:   CHIEF COMPLAINT: rash under left breast  HPI:   Abigail Wallace is a 75 y.o. yo with history notable for chronic kidney disease stage IIIb, hypertension, atrial fibrillation with permanent pacemaker for complete heart block and very well controlled type II by diabetes presenting for routine follow-up.  She is joined by her caregiver and son [name].  He was away camping this weekend and the patient did okay without him at home.  She does feel little bit about anxiety and did not sleep as well but they both recognize that given the amount of caregiving he is doing he needed this trip.  Patient reports overall she is doing okay.  Her main concern is a rash on her left breast.  She did not shower this weekend.  She is tried nothing for this.  It is mildly pruritic.  She has no other rashes.  The patient is still smoking 5 to 6 cigarettes a day.  She reports this did increase while her son is gone but she is thinking about "quitting again.  No fevers, chills or difficulty breathing.   The patient reports her blood sugars have ranged in the 130s to 140s.  Her son checks these regularly.  She is watching her diet.  She is very pleased with her A1c today.  Medications reviewed.  She is taking metoprolol 50 mg in the morning 100 mg at night.  This was altered as the patient is feeling slightly sleepy during the day.  They have not noticed an improvement but her blood pressures have been within goal with this change.  At home sometimes her readings are higher. (973Z systolic with 32D diastolic).   PERTINENT  PMH / PSH/Family/Social History : updated and reviewed  OBJECTIVE:   BP 123/62   Pulse 70   SpO2 96%   Today's weight:  Review of prior weights: [employer]   Very pleasant elderly appearing woman in no distress.  Regular rate and rhythm no murmurs rubs or gallops.  Lungs clear bilaterally to auscultation.  Abdomen is examined and really an inframammary fold there is an area of  erythema on the left side with scattered papules.  There are no pustules.  There are no vesicles. Umbilical site is healing and well cared for. Feet are examined she has good dorsalis pedis pulses her right fourth digit on the foot has a small ecchymotic appearing blister.   ASSESSMENT/PLAN:   Essential hypertension At goal currently.  Metoprolol dose corrected in epic.  CKD stage IIIb Repeat metabolic panel today.  Encouraged continued reduction of salt.  She will continue antihypertensive therapy.  Tobacco use disorder Discussed reduction and encourage complete cessation.  Motivational interviewing and supportive listening provided today.  Paranoid schizophrenia ([place]) CBC and hepatic function panel for monitoring today.  She is had no side effects and has follow-up with her psychiatrist in January.  Type 2 diabetes, A1c well below goal, given well-controlled status will consider discontinuation of linagliptin in the future.  SGLT2 inhibitor was not added given her history of urinary symptoms and predisposition to Candida  Left breast intertrigo, inframammary fold most consistent with Candida infection, no overlying signs of shingles and she has no pain with this.  We will treat with combination nystatin and topical steroid given inflammation with this.  Healthcare maintenance declined influenza vaccine today. At follow up--discuss low dose CT (due for follow up February for this)      [name], MD  [facility] Medicine  [place]

## 2020-11-28 NOTE — Assessment & Plan Note (Signed)
Discussed reduction and encourage complete cessation.  Motivational interviewing and supportive listening provided today.

## 2020-11-28 NOTE — Assessment & Plan Note (Signed)
At goal currently.  Metoprolol dose corrected in epic.

## 2020-11-28 NOTE — Assessment & Plan Note (Signed)
CBC and hepatic function panel for monitoring today.  She is had no side effects and has follow-up with her psychiatrist in January.

## 2020-11-28 NOTE — Assessment & Plan Note (Signed)
Repeat metabolic panel today.  Encouraged continued reduction of salt.  She will continue antihypertensive therapy.

## 2020-11-28 NOTE — Patient Instructions (Addendum)
It was wonderful to see you today.  Please bring ALL of your medications with you to every visit.   Today we talked about:  - Going to the lab---we will check your liver and kidneys and white blood cells   Two creams for your rash to use TOGETHER - Nystatin - A weak steroid cream (this does not affect blood sugar)  Follow up in 3 months  HAPPY BIRTHDAY if I don't see you    Thank you for choosing [facility].   Please call [phone] with any questions about today's appointment.  Please be sure to schedule follow up at the front  desk before you leave today.   [name], MD  Family Medicine

## 2020-11-29 ENCOUNTER — Telehealth: Payer: Self-pay | Admitting: Family Medicine

## 2020-11-29 ENCOUNTER — Encounter: Payer: Self-pay | Admitting: Family Medicine

## 2020-11-29 LAB — COMPREHENSIVE METABOLIC PANEL
ALT: 6 IU/L (ref 0–32)
AST: 11 IU/L (ref 0–40)
Albumin/Globulin Ratio: 1.7 (ref 1.2–2.2)
Albumin: 4.3 g/dL (ref 3.7–4.7)
Alkaline Phosphatase: 77 IU/L (ref 44–121)
BUN/Creatinine Ratio: 15 (ref 12–28)
BUN: 24 mg/dL (ref 8–27)
Bilirubin Total: 0.3 mg/dL (ref 0.0–1.2)
CO2: 19 mmol/L — ABNORMAL LOW (ref 20–29)
Calcium: 10.1 mg/dL (ref 8.7–10.3)
Chloride: 106 mmol/L (ref 96–106)
Creatinine, Ser: 1.64 mg/dL — ABNORMAL HIGH (ref 0.57–1.00)
Globulin, Total: 2.6 g/dL (ref 1.5–4.5)
Glucose: 105 mg/dL — ABNORMAL HIGH (ref 70–99)
Potassium: 4.6 mmol/L (ref 3.5–5.2)
Sodium: 140 mmol/L (ref 134–144)
Total Protein: 6.9 g/dL (ref 6.0–8.5)
eGFR: 32 mL/min/{1.73_m2} — ABNORMAL LOW (ref >59)

## 2020-11-29 LAB — CBC WITH DIFFERENTIAL/PLATELET
Basophils Absolute: 0.1 10*3/uL (ref 0.0–0.2)
Basos: 1 %
EOS (ABSOLUTE): 0.3 10*3/uL (ref 0.0–0.4)
Eos: 2 %
Hematocrit: 42.4 % (ref 34.0–46.6)
Hemoglobin: 13.9 g/dL (ref 11.1–15.9)
Immature Grans (Abs): 0.1 10*3/uL (ref 0.0–0.1)
Immature Granulocytes: 1 %
Lymphocytes Absolute: 3.8 10*3/uL — ABNORMAL HIGH (ref 0.7–3.1)
Lymphs: 28 %
MCH: 27.5 pg (ref 26.6–33.0)
MCHC: 32.8 g/dL (ref 31.5–35.7)
MCV: 84 fL (ref 79–97)
Monocytes Absolute: 0.8 10*3/uL (ref 0.1–0.9)
Monocytes: 6 %
Neutrophils Absolute: 8.5 10*3/uL — ABNORMAL HIGH (ref 1.4–7.0)
Neutrophils: 62 %
Platelets: 296 10*3/uL (ref 150–450)
RBC: 5.05 x10E6/uL (ref 3.77–5.28)
RDW: 14.1 % (ref 11.7–15.4)
WBC: 13.6 10*3/uL — ABNORMAL HIGH (ref 3.4–10.8)

## 2020-11-29 NOTE — Telephone Encounter (Signed)
Called and left generic voicemail for patient to call back re: results.

## 2020-11-29 NOTE — Telephone Encounter (Signed)
Patient is returning Dr. Saul Fordyce call. She would like to her call her back when she gets a chance.

## 2020-11-29 NOTE — Telephone Encounter (Signed)
See note--already called patient.   [name], MD  Family Medicine Teaching Service

## 2020-11-29 NOTE — Telephone Encounter (Signed)
Called and spoke with patient. Reviewed UA, CBC, and CMP results. UA negative for infection. No indication for ABX at this time. CBC and CMP reviewed--persistent WBC elevation, will discuss Hematology again with son. Continue sodium bicarb. Renal function appropriate.  [name], MD  Family Medicine Teaching Service

## 2020-11-30 ENCOUNTER — Telehealth: Payer: Self-pay | Admitting: Family Medicine

## 2020-11-30 NOTE — Telephone Encounter (Signed)
Called patient son to discuss results.  Reviewed basic findings of echocardiogram as he had a few questions.  Let him know that cardiology be reaching out.  White blood cell count remains mildly elevated, suspect due to tobacco abuse but she does have elevated leukocytes and lymphocytes.  She had a recent SPEP which was normal.  Discussed and offered referral to hematology.  Patient's on will discuss with patient more and call back.

## 2020-12-05 ENCOUNTER — Telehealth: Payer: Self-pay

## 2020-12-05 NOTE — Telephone Encounter (Signed)
LVM for patient to send manual transmission direct number to device clinic if patient needs help sending transmission

## 2020-12-05 NOTE — Telephone Encounter (Signed)
-----   Message from Sanda Klein, MD sent at 12/04/2020  3:44 PM EST ----- Can we please do an extra download this week on her to see if still in AFib? ----- Message ----- From: Geralynn Rile, MD Sent: 11/30/2020   7:45 AM EST To: Sanda Klein, MD, Caprice Beaver, LPN  Seen for Afib as DOD appointment. Echo shows reduced ejection fraction. She is 100% paced so maybe this is the issue. I have cc'ed Mihai to follow-up on this.   -Wes

## 2020-12-06 ENCOUNTER — Telehealth: Payer: Self-pay

## 2020-12-06 NOTE — Telephone Encounter (Signed)
-----   Message from Sanda Klein, MD sent at 12/04/2020  3:44 PM EST ----- Can we please do an extra download this week on her to see if still in AFib? ----- Message ----- From: Geralynn Rile, MD Sent: 11/30/2020   7:45 AM EST To: Sanda Klein, MD, Caprice Beaver, LPN  Seen for Afib as DOD appointment. Echo shows reduced ejection fraction. She is 100% paced so maybe this is the issue. I have cc'ed Mihai to follow-up on this.   -Wes

## 2020-12-06 NOTE — Telephone Encounter (Signed)
Spoke with patient, advised Dr. Sallyanne Kuster would like follow-up transmission.  She needs assistance from her son to send manual transmission.  She will have him help her either tonight or first thing tomorrow morning.

## 2020-12-12 ENCOUNTER — Telehealth: Payer: Self-pay | Admitting: Physician Assistant

## 2020-12-12 NOTE — Telephone Encounter (Signed)
Spoke with the patient. She stated that she was asymptomatic. She is unable to come tomorrow. Appointment made for 12/6. She will call back if anything changes.

## 2020-12-12 NOTE — Telephone Encounter (Signed)
Left a message for the patient to call back for an appointment with Dr. Sallyanne Kuster tomorrow at 3:20 pm. Abigail Wallace has been held.

## 2020-12-12 NOTE — Telephone Encounter (Signed)
Manual transmission received 12/06/20.   Pt was still in Af at that time.    Forwarding to MD for review.

## 2020-12-12 NOTE — Telephone Encounter (Signed)
Received call from patient's son requesting information regarding her mother's Vfib. I reviewed her recent download and reassured the patient is in Afib. The patient thought she was told she was in [place].  She is asymptomatic, BP "stable."   He would like to note that he should be contacted in the future for communication from our office.

## 2020-12-13 ENCOUNTER — Telehealth: Payer: Self-pay | Admitting: Cardiovascular Disease

## 2020-12-13 NOTE — Telephone Encounter (Signed)
Patient's son would like it added to the patient's chart to contact him when the patient is contacted, because she gets very confused. He says she was confused and told him she had v fib once and he was extremely concerned.

## 2020-12-13 NOTE — Telephone Encounter (Signed)
Notes placed in chart to contact Georgena Spurling first.

## 2020-12-14 ENCOUNTER — Encounter: Payer: Self-pay | Admitting: Family Medicine

## 2020-12-14 ENCOUNTER — Telehealth: Payer: Self-pay

## 2020-12-14 DIAGNOSIS — J42 Unspecified chronic bronchitis: Secondary | ICD-10-CM

## 2020-12-14 DIAGNOSIS — J4520 Mild intermittent asthma, uncomplicated: Secondary | ICD-10-CM

## 2020-12-14 NOTE — Telephone Encounter (Signed)
Community message sent to [employer]. Will await response.   Isaia Hassell C Yoona Ishii, RN  

## 2020-12-16 ENCOUNTER — Ambulatory Visit: Payer: Medicare Other | Admitting: Podiatry

## 2020-12-18 ENCOUNTER — Encounter: Payer: Self-pay | Admitting: Cardiovascular Disease

## 2020-12-19 ENCOUNTER — Telehealth: Payer: Self-pay | Admitting: Cardiovascular Disease

## 2020-12-19 NOTE — Telephone Encounter (Signed)
Spoke with son - scheduled for 12/8/ @ 11:20am per MyChart message that Lattie Haw RN had sent to [employer]

## 2020-12-19 NOTE — Telephone Encounter (Signed)
Patient's son states the patient has no transportation for her appointment tomorrow and cannot take transportation services because of her mental health issues. He would like to know if she can be seen another day this week or if the patient's appointment can wait until January, which was the next available appointment.

## 2020-12-20 ENCOUNTER — Other Ambulatory Visit: Payer: Self-pay

## 2020-12-20 ENCOUNTER — Encounter: Payer: Medicare Other | Admitting: Cardiovascular Disease

## 2020-12-20 MED ORDER — SENNA 8.6 MG PO TABS: 1.0000 | ORAL_TABLET | Freq: Every day | ORAL | 0 refills | Status: DC | PRN

## 2020-12-22 ENCOUNTER — Encounter: Payer: Self-pay | Admitting: Cardiovascular Disease

## 2020-12-22 ENCOUNTER — Other Ambulatory Visit: Payer: Self-pay

## 2020-12-22 ENCOUNTER — Ambulatory Visit (INDEPENDENT_AMBULATORY_CARE_PROVIDER_SITE_OTHER): Payer: Medicare Other | Admitting: Cardiovascular Disease

## 2020-12-22 ENCOUNTER — Ambulatory Visit: Payer: Medicare Other

## 2020-12-22 VITALS — BP 128/60 | HR 70 | Resp 20 | Ht 66.0 in | Wt 261.0 lb

## 2020-12-22 DIAGNOSIS — I7143 Infrarenal abdominal aortic aneurysm, without rupture: Secondary | ICD-10-CM

## 2020-12-22 DIAGNOSIS — I10 Essential (primary) hypertension: Secondary | ICD-10-CM | POA: Diagnosis not present

## 2020-12-22 DIAGNOSIS — I5042 Chronic combined systolic (congestive) and diastolic (congestive) heart failure: Secondary | ICD-10-CM

## 2020-12-22 DIAGNOSIS — I4819 Other persistent atrial fibrillation: Secondary | ICD-10-CM | POA: Diagnosis not present

## 2020-12-22 DIAGNOSIS — G4733 Obstructive sleep apnea (adult) (pediatric): Secondary | ICD-10-CM

## 2020-12-22 DIAGNOSIS — E782 Mixed hyperlipidemia: Secondary | ICD-10-CM

## 2020-12-22 DIAGNOSIS — Z950 Presence of cardiac pacemaker: Secondary | ICD-10-CM

## 2020-12-22 DIAGNOSIS — E119 Type 2 diabetes mellitus without complications: Secondary | ICD-10-CM

## 2020-12-22 DIAGNOSIS — I495 Sick sinus syndrome: Secondary | ICD-10-CM | POA: Diagnosis not present

## 2020-12-22 DIAGNOSIS — I442 Atrioventricular block, complete: Secondary | ICD-10-CM

## 2020-12-22 NOTE — Patient Instructions (Signed)
Medication Instructions:  No changes *If you need a refill on your cardiac medications before your next appointment, please call your pharmacy*  Follow-Up: At [facility], you and your health needs are our priority.  As part of our continuing mission to provide you with exceptional heart care, we have created designated Provider Care Teams.  These Care Teams include your primary Cardiologist (physician) and Advanced Practice Providers (APPs -  Physician Assistants and Nurse Practitioners) who all work together to provide you with the care you need, when you need it.  We recommend signing up for the patient portal called "MyChart".  Sign up information is provided on this After Visit Summary.  MyChart is used to connect with patients for Virtual Visits (Telemedicine).  Patients are able to view lab/test results, encounter notes, upcoming appointments, etc.  Non-urgent messages can be sent to your provider as well.   To learn more about what you can do with MyChart, go to [email or web address].    Your next appointment:   3 week(s) after your cardioversion on 01/27/21 (on a device day)  The format for your next appointment:   In Person  Provider:   Sanda Klein, MD     Other Instructions  You are scheduled for a Cardioversion on 01/27/2021 with Dr. Audie Box.  Please arrive at the [facility] (Main Entrance A) at [facility]: [place], [place] [address] at 2:30 pm. (1 hour prior to procedure)  DIET: Nothing to eat or drink after midnight except a sip of water with medications (see medication instructions below)  FYI: For your safety, and to allow [place] to monitor your vital signs accurately during the surgery/procedure we request that   if you have artificial nails, gel coating, SNS etc. Please have those removed prior to your surgery/procedure. Not having the nail coverings /polish removed may result in cancellation or delay of your surgery/procedure.   Medication  Instructions: Hold: Nothing to hold  Continue your anticoagulant: Eliquis You will need to continue your anticoagulant after your procedure until you  are told by your provider that it is safe to stop   Labs: Your provider would like for you to return 01/21/20 to have the following labs drawn: CBC and BMET. You do not need an appointment for the lab. Once in our office lobby there is a podium where you can sign in and ring the doorbell to alert [place] that you are here. The lab is open from 8:00 am to 4:30 pm; closed for lunch from 12:45pm-1:45pm.   You must have a responsible person to drive you home and stay in the waiting area during your procedure. Failure to do so could result in cancellation.  Bring your insurance cards.  *Special Note: Every effort is made to have your procedure done on time. Occasionally there are emergencies that occur at the hospital that may cause delays. Please be patient if a delay does occur.

## 2020-12-22 NOTE — Progress Notes (Signed)
Patient ID: Abigail Wallace, female   DOB: 06-08-1945, 75 y.o.   MRN: [id number]    Cardiology Office Note    Date:  12/22/2020   ID:  [address] [place], [place] 1945/05/08, MRN [id number]  PCP:  Martyn Malay, MD  Cardiologist:   Sanda Klein, MD   No chief complaint on file.   History of Present Illness:  Abigail Wallace is a 75 y.o. female with complete heart block and sinus node dysfunction who presents for atrial fibrillation and a pacemaker check. As always, her son [name] accompanies her today.  Other significant medical problems include obstructive sleep apnea, morbid obesity, AAA, mixed hyperlipidemia and history of schizophrenia.  Asked her to come here today since she has been in persistent atrial fibrillation since late July.  Only became aware of it on her every 66-monthdownload and then she had hard time coming in for an appointment.  She has now been on this abnormal rhythm for about 4 months.  During this time she has had a decrease in her level of energy and is quite frustrated with her poor physical health.  She does not have orthopnea or PND and does not have any change in her lower extremity edema.  In fact, she weighs about 4 pounds less than she did at her last appointment.  She has 100% paced rhythm and not surprisingly does not have any palpitations.  She denies syncope or chest pain.  She has not had any falls or bleeding and is compliant with anticoagulation with Eliquis.  She has not had recent problems with orthostatic hypotension.  Her most recent creatinine was a little higher at 1.64 on November 14.  She has a small infrarenal abdominal aortic aneurysm that was measured with a diameter of 3.4 cm on ultrasound.  This area of the aorta measured 2.9 cm on CT of the abdomen.  She has not been able to fully quit smoking, keeps it less than 5 cigarettes a day.  She remains morbidly obese with a BMI of 42.  Her most recent hemoglobin A1c was not bad at 6.2% and her LDL was 77.   Triglycerides also mildly elevated at 218.  She does not have known CAD or PAD, other than the infrarenal AAA.  Her pacemaker is a dual-chamber Medtronic Adapta device implanted in August 2016 as a generator change out, leads from 2007. She is pacemaker dependent due to complete heart block (she has an idioventricular escape rhythm around 30 bpm, not always present including none today). She also has significant sinus bradycardia.  Prior to the onset of atrial fibrillation she has 74% atrial pacing with good heart rate histograms and she always has 100% ventricular pacing.  In the past, she has had very brief nonsustained VT once or twice a month, but none recently there was a particularly long run in December 2021.  She initially received a dual-chamber permanent pacemaker in 2007 for symptomatic AV block. She has minimal coronary atherosclerosis by cardiac catheterization performed in 2007 and no evidence of insufficiency by nuclear perfusion testing in 2012. By echocardiography she has normal left ventricular size and systolic function and no major structural cardiac abnormalities.    Past Medical History:  Diagnosis Date   Abdominal wall hernia 01/2017   Atrial fibrillation (HCC)    CHF (congestive heart failure) (HCC)    CKD (chronic kidney disease)    COPD (chronic obstructive pulmonary disease) (HCC)    Coronary artery disease  NON-CRITICAL   Ectasis aorta (HCC)    GERD (gastroesophageal reflux disease)    Gout    Hypertension    Memory difficulties 12/02/2013   Pacemaker    Psychosis ([place])    Tardive dyskinesia 12/02/2013   Type 2 diabetes mellitus [facility])     Past Surgical History:  Procedure Laterality Date   CARDIAC CATHETERIZATION  2007   Non-critical.    CHOLECYSTECTOMY     ELBOW SURGERY     EP IMPLANTABLE DEVICE N/A 08/31/2014   Procedure: PPM Generator Changeout;  Surgeon: Sanda Klein, MD;  Location: [facility];  Service: Cardiovascular;  Laterality: N/A;    INSERT / REPLACE / REMOVE PACEMAKER  2007   [place]/SYMPTOMATIC HEART BLOCK   PACEMAKER PLACEMENT  09/28/2005   2/2 SSS?   Persantine stress test  05/01/2010   EF 66%. Normal LV sys fx. Unchanged from previous studies.    TRANSTHORACIC ECHOCARDIOGRAM  09/08/10   SEVERE CONCENTRIC HYPERTROPHY.LV FUNCTION WAS VIGOROUS.EF 65%-70%.VENTRICULAR SEPTUM-INCOORDINATE MOTION.LEFT ATRIUM-MILDLY DILATED.TRIVIAL TR.   TUBAL LIGATION      Outpatient Medications Prior to Visit  Medication Sig Dispense Refill   acetaminophen (TYLENOL) 500 MG tablet Take 1,000 mg by mouth every 6 (six) hours as needed for moderate pain.      albuterol (PROVENTIL) (2.5 MG/3ML) 0.083% nebulizer solution Take 3 mLs (2.5 mg total) by nebulization every 6 (six) hours as needed for wheezing or shortness of breath. 150 mL 6   albuterol (VENTOLIN HFA) 108 (90 Base) MCG/ACT inhaler Inhale 2 puffs into the lungs every 6 (six) hours as needed for wheezing or shortness of breath. 18 g 3   allopurinol (ZYLOPRIM) 100 MG tablet Take 1 tablet (100 mg total) by mouth daily. 90 tablet 3   amLODipine (NORVASC) 5 MG tablet Take 1 tablet (5 mg total) by mouth daily. 90 tablet 3   apixaban (ELIQUIS) 5 MG TABS tablet Take 1 tablet (5 mg total) by mouth 2 (two) times daily. 180 tablet 3   Blood Glucose Monitoring Suppl (ONETOUCH VERIO) w/Device KIT Check blood sugar once daily 1 kit 3   cholecalciferol (VITAMIN D3) 25 MCG (1000 UNIT) tablet Take 1 tablet (1,000 Units total) by mouth daily. 90 tablet 3   diclofenac Sodium (VOLTAREN) 1 % GEL Apply 2 g topically 4 (four) times daily as needed (pain). 350 g 2   divalproex (DEPAKOTE ER) 250 MG 24 hr tablet Take 250 mg by mouth at bedtime.      docusate sodium (COLACE) 100 MG capsule Take 1 capsule (100 mg total) by mouth 2 (two) times daily. 10 capsule 0   glucose blood (ONETOUCH VERIO) test strip Please use to check blood sugar once daily. E11.9 100 each 12   glucose blood test strip Test each  morning before breakfast 100 each 12   hydrocortisone cream 0.5 % Apply 1 application topically 2 (two) times daily. Apply WITH Nystatin under left breast 30 g 0   Incontinence Supplies MISC 1 Units by Does not apply route as needed. 100 each prn   Lancet Device MISC 1 Device by Does not apply route 3 (three) times a week. 1 each 11   linagliptin (TRADJENTA) 5 MG TABS tablet Take 1 tablet (5 mg total) by mouth daily. 90 tablet 3   metoprolol tartrate (LOPRESSOR) 50 MG tablet Take 2 tablets (100 mg total) by mouth 2 (two) times daily. (Patient taking differently: Take 100 mg by mouth. Take 1 tablet in the morning and 2  tablets at bedtime) 360 tablet 2   mupirocin ointment (BACTROBAN) 2 % Apply 1 application topically 2 (two) times daily. To affected area 22 g 0   nitroGLYCERIN (NITROSTAT) 0.4 MG SL tablet PLACE ONE TABLET UNDER THE TONGUE EVERY FIVE MINUTES FOR THREE DOSES AS NEEDED FOR CHEST PAIN. CALL 911 AFTER THAT 25 tablet 6   nystatin cream (MYCOSTATIN) Apply 1 application topically 2 (two) times daily. 30 g 0   OLANZapine (ZYPREXA) 10 MG tablet Take 10 mg by mouth at bedtime.  0   olmesartan (BENICAR) 20 MG tablet Take 20 mg by mouth daily.     OneTouch Delica Lancets 11S MISC Use to test once daily.  DX code:E11.9 100 each 3   pravastatin (PRAVACHOL) 40 MG tablet TAKE ONE TABLET BY MOUTH EVERY EVENING 90 tablet 0   sodium bicarbonate 650 MG tablet Take 1 tablet (650 mg total) by mouth 2 (two) times daily. 180 tablet 3   senna (SENOKOT) 8.6 MG TABS tablet Take 1 tablet (8.6 mg total) by mouth daily as needed for mild constipation. (Patient not taking: Reported on 12/22/2020) 30 tablet 0   No facility-administered medications prior to visit.     Allergies:   Abilify [aripiprazole], Clozapine, Benadryl [diphenhydramine hcl], Cogentin [benztropine], Haloperidol lactate, Latuda [lurasidone hcl], Lorazepam, Remeron [mirtazapine], Keflex [cephalexin], Codeine, and Latex   Social History    Socioeconomic History   Marital status: Divorced    Spouse name: Not on file   Number of children: Not on file   Years of education: Not on file   Highest education level: Not on file  Occupational History   Not on file  Tobacco Use   Smoking status: Every Day    Packs/day: 0.25    Types: Cigarettes   Smokeless tobacco: Never   Tobacco comments:    6 cigs a day  Vaping Use   Vaping Use: Some days  Substance and Sexual Activity   Alcohol use: No    Alcohol/week: 0.0 standard drinks   Drug use: No   Sexual activity: Not on file  Other Topics Concern   Not on file  Social History Narrative   Lives alone. Son brings to appointments ([name])    Children: 2 daughters, 1 son; 91 GC, 5 GGC.    Occupation: disabled.    Caffeine:   Tobacco: 5-6/day   Denies alcohol.       Lives in a one story home in apartment complex   Right Handed    Social Determinants of Health   Financial Resource Strain: Not on file  Food Insecurity: Not on file  Transportation Needs: Not on file  Physical Activity: Not on file  Stress: Not on file  Social Connections: Not on file     Family History:  The patient's family history includes Diabetes type II in her son; Heart disease in her father and mother; Stroke in her sister.   ROS:   Please see the history of present illness.    All other systems reviewed and are negative.   PHYSICAL EXAM:   VS:  BP 128/60 (BP Location: Right Arm, Patient Position: Sitting, Cuff Size: Normal)   Pulse 70   Resp 20   Ht _0  (1.676 m)   Wt 261 lb (118.4 kg)   SpO2 97%   BMI 42.13 kg/m      General: Alert, oriented x3, no distress, morbidly obese.  Healthy pacemaker site. Head: no evidence of trauma, PERRL, EOMI, no exophtalmos or  lid lag, no myxedema, no xanthelasma; normal ears, nose and oropharynx Neck: normal jugular venous pulsations and no hepatojugular reflux; brisk carotid pulses without delay and no carotid bruits Chest: clear to auscultation,  no signs of consolidation by percussion or palpation, normal fremitus, symmetrical and full respiratory excursions Cardiovascular: normal position and quality of the apical impulse, regular rhythm, normal first and second heart sounds, no murmurs, rubs or gallops Abdomen: no tenderness or distention, no masses by palpation, no abnormal pulsatility or arterial bruits, normal bowel sounds, no hepatosplenomegaly Extremities: no clubbing, cyanosis or edema; 2+ radial, ulnar and brachial pulses bilaterally; 2+ right femoral, posterior tibial and dorsalis pedis pulses; 2+ left femoral, posterior tibial and dorsalis pedis pulses; no subclavian or femoral bruits Neurological: grossly nonfocal except for tardive dyskinesia Psych: Normal mood and affect   Wt Readings from Last 3 Encounters:  12/22/20 261 lb (118.4 kg)  11/21/20 265 lb (120.2 kg)  10/10/20 268 lb (121.6 kg)    Studies/Labs Reviewed:   ECHO 11/28/2020   1. Left ventricular ejection fraction, by estimation, is 40 to 45%. The  left ventricle has mildly decreased function. The left ventricle has no  regional wall motion abnormalities. There is severe asymmetric left  ventricular hypertrophy of the  basal-septal segment. Left ventricular diastolic function could not be  evaluated. There is severe akinesis of the left ventricular, mid-apical  inferior wall, apical segment and septal wall.   2. Right ventricular systolic function is mildly reduced. The right  ventricular size is normal.   3. The mitral valve is grossly normal. Trivial mitral valve  regurgitation.   4. The aortic valve is tricuspid. Aortic valve regurgitation is not  visualized. Aortic valve sclerosis is present, with no evidence of aortic  valve stenosis.   5. The inferior vena cava is normal in size with greater than 50%  respiratory variability, suggesting right atrial pressure of 3 mmHg.   Comparison(s): Prior images unable to be directly viewed, comparison made   by report only. Changes from prior study are noted. 05/04/2015: LVEF  55-60%.  EKG:  EKG is ordered today.  Personally reviewed, it shows atrial fibrillation and 100% ventricular paced rhythm.  Broad QRS (paced) only 42 ms, QTC 477 ms  Recent Labs: 11/28/2020: ALT 6; BUN 24; Creatinine, Ser 1.64; Hemoglobin 13.9; Platelets 296; Potassium 4.6; Sodium 140  Lipid Panel     Component Value Date/Time   CHOL 155 05/30/2020 1401   TRIG 218 (H) 05/30/2020 1401   HDL 42 05/30/2020 1401   CHOLHDL 3.7 05/30/2020 1401   CHOLHDL 2.7 11/19/2010 0605   VLDL 15 11/19/2010 0605   LDLCALC 77 05/30/2020 1401   LDLDIRECT 80 03/20/2012 1510     ASSESSMENT:    1. Persistent atrial fibrillation ([place])   2. Chronic diastolic heart failure ([place])   3. Essential hypertension   4. SSS (sick sinus syndrome) ([place])   5. CHB (complete heart block) (HCC)   6. Pacemaker   7. Mixed hyperlipidemia   8. Infrarenal abdominal aortic aneurysm (AAA) without rupture   9. Morbid obesity ([place])   10. Well controlled type 2 diabetes mellitus ([place])   11. OSA (obstructive sleep apnea)       PLAN:  In order of problems listed above:  Afib: In the past, her atrial fibrillation used to be paroxysmal and infrequent, with episodes lasting for only a few hours at a time.  She has not been in persistent atrial fibrillation for about 4 months and there  has been a reduction in her quality of life.  We will try cardioversion.  We discussed the purpose of cardioversion, risks and benefits and possible complications of the shock and the necessary sedation.  Patient would like to give it a try.  We will schedule on a day when her son can be there with her.  Very important not to interrupt anticoagulation between now and then.  CHADSVasc 6-7 (age 23, gender, DM, HTN, HF, +/-PAD). CHF: Her LVEF has decreased and this cannot be attributed to atrial fibrillation or RV pacing since she has longstanding complete heart block with 100%  ventricular pacing.  On my review of the images I do not see any wall motion abnormalities that would fit with expected coronary insufficiency distribution, but rather I believe there is profound RV pacing-related apex-to-base and septal-lateral dyssynchrony.  As far as I can tell she is clinically euvolemic, her weight is slightly lower than in the past.  Hard to assess functional status due to sedentary lifestyle, which she feels that she is more tired and her energy level is substantially lower over the last 3 months or so. HTN: Well-controlled.  On maximum dose amlodipine and we have had to decrease her beta-blocker dose due to side effects.  History of intolerance to ARB.  Avoiding diuretics due to renal dysfunction. SSS: In the past, when not in atrial fibrillation, she did have some need for atrial pacing. CHB: Should be considered pacemaker dependent.  No escape rhythm detected today.  Option for CRT-P upgrade, but she has had RV pacing for years without heart failure, decompensation appears to be secondary to atrial fibrillation. PPM: After cardioversion, we will schedule for remote download in a month and then back to her every 44-monthschedule. HLP: Ideally would like to have her LDL less than 70 since she does have a AAA, although she does not have any other features of PAD or CAD.  Most recent LDL cholesterol 77 which is not ideal, but I think is acceptable. AAA: Relatively small at 3.4 cm diameter, stable on yearly ultrasound.  Will be due for another ultrasound next May. Obesity: Has lost a few more pounds, remains morbidly obese. DM: Well-controlled, most recent hemoglobin A1c 6.2% last month. OSA: Compliant with CPAP and denies daytime hypersomnolence.  The cardioversion with sedation procedure has been fully reviewed with the patient and her family and informed consent has been obtained.    Medication Adjustments/Labs and Tests Ordered: Current medicines are reviewed at length with  the patient today.  Concerns regarding medicines are outlined above.  Medication changes, Labs and Tests ordered today are listed in the Patient Instructions below. Patient Instructions  Medication Instructions:  No changes *If you need a refill on your cardiac medications before your next appointment, please call your pharmacy*  Follow-Up: [facility] and your health needs are our priority.  As part of our continuing mission to provide you with exceptional heart care, we have created designated Provider Care Teams.  These Care Teams include your primary Cardiologist (physician) and Advanced Practice Providers (APPs -  Physician Assistants and Nurse Practitioners) who all work together to provide you with the care you need, when you need it.  We recommend signing up for the patient portal called "MyChart".  Sign up information is provided on this After Visit Summary.  MyChart is used to connect with patients for Virtual Visits (Telemedicine).  Patients are able to view lab/test results, encounter notes, upcoming appointments, etc.  Non-urgent  messages can be sent to your provider as well.   To learn more about what you can do with MyChart, go to [email or web address].    Your next appointment:   3 week(s) after your cardioversion on 01/27/21 (on a device day)  The format for your next appointment:   In Person  Provider:   Sanda Klein, MD     Other Instructions  You are scheduled for a Cardioversion on 01/27/2021 with Dr. Audie Box.  Please arrive at the [facility] (Main Entrance A) at [facility]: [place], [place] [address] at 2:30 pm. (1 hour prior to procedure)  DIET: Nothing to eat or drink after midnight except a sip of water with medications (see medication instructions below)  FYI: For your safety, and to allow [place] to monitor your vital signs accurately during the surgery/procedure we request that   if you have artificial nails, gel coating, SNS  etc. Please have those removed prior to your surgery/procedure. Not having the nail coverings /polish removed may result in cancellation or delay of your surgery/procedure.   Medication Instructions: Hold: Nothing to hold  Continue your anticoagulant: Eliquis You will need to continue your anticoagulant after your procedure until you  are told by your provider that it is safe to stop   Labs: Your provider would like for you to return 01/21/20 to have the following labs drawn: CBC and BMET. You do not need an appointment for the lab. Once in our office lobby there is a podium where you can sign in and ring the doorbell to alert [place] that you are here. The lab is open from 8:00 am to 4:30 pm; closed for lunch from 12:45pm-1:45pm.   You must have a responsible person to drive you home and stay in the waiting area during your procedure. Failure to do so could result in cancellation.  Bring your insurance cards.  *Special Note: Every effort is made to have your procedure done on time. Occasionally there are emergencies that occur at the hospital that may cause delays. Please be patient if a delay does occur.       Signed, Sanda Klein, MD  12/22/2020 12:02 PM    [address], [place], [place]  [address] Phone: [phone]; Fax: [phone]

## 2020-12-27 ENCOUNTER — Ambulatory Visit (INDEPENDENT_AMBULATORY_CARE_PROVIDER_SITE_OTHER): Payer: Medicare Other

## 2020-12-27 DIAGNOSIS — Z0000 Encounter for general adult medical examination without abnormal findings: Secondary | ICD-10-CM | POA: Diagnosis not present

## 2020-12-27 NOTE — Patient Instructions (Addendum)
You spoke to Abigail Wallace, [place] over the phone for your annual wellness visit.  We discussed goals:   Goals       Acknowledge receipt of Advanced Directive package     Patient would like packet mailed to her.  Education provided on completing packet.       Quit Smoking     Patient reports she smokes ~6 cigs per day.  Patient reports she is under a lot of stress with family illness.          We also discussed recommended health maintenance. As discussed, you are due for: Health Maintenance  Topic Date Due   COVID-19 Vaccine (1) Never done   Zoster Vaccines- Shingrix (1 of 2) Never done   Pneumonia Vaccine 61+ Years old (2 - PCV) 08/16/1998   TETANUS/TDAP  10/15/2012   OPHTHALMOLOGY EXAM  06/23/2019   FOOT EXAM  01/26/2020   INFLUENZA VACCINE  04/14/2021 (Originally 08/15/2020)   DEXA SCAN  05/30/2021 (Originally 02/09/2008)   HEMOGLOBIN A1C  05/28/2021   Hepatitis C Screening  Completed   HPV VACCINES  Aged Out   Good luck with upcoming procedure!  PCP apt needs to be scheduled for February.  Call after your surgery to make this.  I will mail you an advance directive per discussion.   Preventive Care 39 Years and Older, Female Preventive care refers to lifestyle choices and visits with your health care provider that can promote health and wellness. Preventive care visits are also called wellness exams. What can I expect for my preventive care visit? Counseling Your health care provider may ask you questions about your: Medical history, including: Past medical problems. Family medical history. Pregnancy and menstrual history. History of falls. Current health, including: Memory and ability to understand (cognition). Emotional well-being. Home life and relationship well-being. Sexual activity and sexual health. Lifestyle, including: Alcohol, nicotine or tobacco, and drug use. Access to firearms. Diet, exercise, and sleep habits. Work and work Statistician. Sunscreen  use. Safety issues such as seatbelt and bike helmet use. Physical exam Your health care provider will check your: Height and weight. These may be used to calculate your BMI (body mass index). BMI is a measurement that tells if you are at a healthy weight. Waist circumference. This measures the distance around your waistline. This measurement also tells if you are at a healthy weight and may help predict your risk of certain diseases, such as type 2 diabetes and high blood pressure. Heart rate and blood pressure. Body temperature. Skin for abnormal spots. What immunizations do I need? Vaccines are usually given at various ages, according to a schedule. Your health care provider will recommend vaccines for you based on your age, medical history, and lifestyle or other factors, such as travel or where you work. What tests do I need? Screening Your health care provider may recommend screening tests for certain conditions. This may include: Lipid and cholesterol levels. Hepatitis C test. Hepatitis B test. HIV (human immunodeficiency virus) test. STI (sexually transmitted infection) testing, if you are at risk. Lung cancer screening. Colorectal cancer screening. Diabetes screening. This is done by checking your blood sugar (glucose) after you have not eaten for a while (fasting). Mammogram. Talk with your health care provider about how often you should have regular mammograms. BRCA-related cancer screening. This may be done if you have a family history of breast, ovarian, tubal, or peritoneal cancers. Bone density scan. This is done to screen for osteoporosis. Talk with your health  care provider about your test results, treatment options, and if necessary, the need for more tests. Follow these instructions at home: Eating and drinking  Eat a diet that includes fresh fruits and vegetables, whole grains, lean protein, and low-fat dairy products. Limit your intake of foods with high amounts of  sugar, saturated fats, and salt. Take vitamin and mineral supplements as recommended by your health care provider. Do not drink alcohol if your health care provider tells you not to drink. If you drink alcohol: Limit how much you have to 0-1 drink a day. Know how much alcohol is in your drink. In the [place] one drink equals one 12 oz bottle of beer (355 mL), one 5 oz glass of wine (148 mL), or one 1 oz glass of hard liquor (44 mL). Lifestyle Brush your teeth every morning and night with fluoride toothpaste. Floss one time each day. Exercise for at least 30 minutes 5 or more days each week. Do not use any products that contain nicotine or tobacco. These products include cigarettes, chewing tobacco, and vaping devices, such as e-cigarettes. If you need help quitting, ask your health care provider. Do not use drugs. If you are sexually active, practice safe sex. Use a condom or other form of protection in order to prevent STIs. Take aspirin only as told by your health care provider. Make sure that you understand how much to take and what form to take. Work with your health care provider to find out whether it is safe and beneficial for you to take aspirin daily. Ask your health care provider if you need to take a cholesterol-lowering medicine (statin). Find healthy ways to manage stress, such as: Meditation, yoga, or listening to music. Journaling. Talking to a trusted person. Spending time with friends and family. Minimize exposure to UV radiation to reduce your risk of skin cancer. Safety Always wear your seat belt while driving or riding in a vehicle. Do not drive: If you have been drinking alcohol. Do not ride with someone who has been drinking. When you are tired or distracted. While texting. If you have been using any mind-altering substances or drugs. Wear a helmet and other protective equipment during sports activities. If you have firearms in your house, make sure you follow all gun  safety procedures. What's next? Visit your health care provider once a year for an annual wellness visit. Ask your health care provider how often you should have your eyes and teeth checked. Stay up to date on all vaccines. This information is not intended to replace advice given to you by your health care provider. Make sure you discuss any questions you have with your health care provider. Document Revised: 06/29/2020 Document Reviewed: 06/29/2020 Elsevier Patient Education  2022 [employer] Prevention in the Home, Adult Falls can cause injuries and can happen to people of all ages. There are many things you can do to make your home safe and to help prevent falls. Ask for help when making these changes. What actions can I take to prevent falls? General Instructions Use good lighting in all rooms. Replace any light bulbs that burn out. Turn on the lights in dark areas. Use night-lights. Keep items that you use often in easy-to-reach places. Lower the shelves around your home if needed. Set up your furniture so you have a clear path. Avoid moving your furniture around. Do not have throw rugs or other things on the floor that can make you trip. Avoid walking on wet  floors. If any of your floors are uneven, fix them. Add color or contrast paint or tape to clearly mark and help you see: Grab bars or handrails. First and last steps of staircases. Where the edge of each step is. If you use a stepladder: Make sure that it is fully opened. Do not climb a closed stepladder. Make sure the sides of the stepladder are locked in place. Ask someone to hold the stepladder while you use it. Know where your pets are when moving through your home. What can I do in the bathroom?   Keep the floor dry. Clean up any water on the floor right away. Remove soap buildup in the tub or shower. Use nonskid mats or decals on the floor of the tub or shower. Attach bath mats securely with double-sided,  nonslip rug tape. If you need to sit down in the shower, use a plastic, nonslip stool. Install grab bars by the toilet and in the tub and shower. Do not use towel bars as grab bars. What can I do in the bedroom? Make sure that you have a light by your bed that is easy to reach. Do not use any sheets or blankets for your bed that hang to the floor. Have a firm chair with side arms that you can use for support when you get dressed. What can I do in the kitchen? Clean up any spills right away. If you need to reach something above you, use a step stool with a grab bar. Keep electrical cords out of the way. Do not use floor polish or wax that makes floors slippery. What can I do with my stairs? Do not leave any items on the stairs. Make sure that you have a light switch at the top and the bottom of the stairs. Make sure that there are handrails on both sides of the stairs. Fix handrails that are broken or loose. Install nonslip stair treads on all your stairs. Avoid having throw rugs at the top or bottom of the stairs. Choose a carpet that does not hide the edge of the steps on the stairs. Check carpeting to make sure that it is firmly attached to the stairs. Fix carpet that is loose or worn. What can I do on the outside of my home? Use bright outdoor lighting. Fix the edges of walkways and driveways and fix any cracks. Remove anything that might make you trip as you walk through a door, such as a raised step or threshold. Trim any bushes or trees on paths to your home. Check to see if handrails are loose or broken and that both sides of all steps have handrails. Install guardrails along the edges of any raised decks and porches. Clear paths of anything that can make you trip, such as tools or rocks. Have leaves, snow, or ice cleared regularly. Use sand or salt on paths during winter. Clean up any spills in your garage right away. This includes grease or oil spills. What other actions can I  take? Wear shoes that: Have a low heel. Do not wear high heels. Have rubber bottoms. Feel good on your feet and fit well. Are closed at the toe. Do not wear open-toe sandals. Use tools that help you move around if needed. These include: Canes. Walkers. Scooters. Crutches. Review your medicines with your doctor. Some medicines can make you feel dizzy. This can increase your chance of falling. Ask your doctor what else you can do to help prevent falls. Where  to find more information Centers for Disease Control and Prevention, [employer]: http://www.wolf.info/ National Institute on Aging: http://kim-miller.com/ Contact a doctor if: You are afraid of falling at home. You feel weak, drowsy, or dizzy at home. You fall at home. Summary There are many simple things that you can do to make your home safe and to help prevent falls. Ways to make your home safe include removing things that can make you trip and installing grab bars in the bathroom. Ask for help when making these changes in your home. This information is not intended to replace advice given to you by your health care provider. Make sure you discuss any questions you have with your health care provider. Document Revised: 08/05/2019 Document Reviewed: 08/05/2019 Elsevier Patient Education  2022 [employer].   Our clinic's number is [phone]. Please call with questions or concerns about what we discussed today.

## 2020-12-27 NOTE — Progress Notes (Signed)
I have reviewed this visit and agree with the documentation.  Brayan Votaw, MD  Family Medicine Teaching Service   

## 2020-12-27 NOTE — Progress Notes (Signed)
Subjective:   Abigail Wallace is a 75 y.o. female who presents for Medicare Annual (Subsequent) preventive examination.  Patient consented to have virtual visit and was identified by name and date of birth. Method of visit: Telephone  Encounter participants: Patient: Abigail Wallace - located at Home Nurse/Provider: Dorna Bloom - located at [facility] Others (if applicable): NA  Review of Systems: Defer to PCP.  Cardiac Risk Factors include: sedentary lifestyle;smoking/ tobacco exposure;diabetes mellitus  Objective:   Vitals: There were no vitals taken for this visit.  There is no height or weight on file to calculate BMI.  Advanced Directives 12/27/2020 11/21/2020 09/05/2020 04/23/2020 03/11/2020 12/07/2019 08/07/2019  Does Patient Have a Medical Advance Directive? _0  No No  Would patient like information on creating a medical advance directive? Yes (MAU/Ambulatory/Procedural Areas - Information given) - No - Patient declined No - Guardian declined No - Patient declined No - Patient declined No - Patient declined   Tobacco Social History   Tobacco Use  Smoking Status Every Day   Packs/day: 0.25   Types: Cigarettes  Smokeless Tobacco Never  Tobacco Comments   6 cigs a day     Ready to quit: Yes Counseling given: Yes Tobacco comments: 6 cigs a day. Patient reports she is under a lot of stress with family illness.  Clinical Intake:  Pre-visit preparation completed: Yes  How often do you need to have someone help you when you read instructions, pamphlets, or other written materials from your doctor or pharmacy?: 1 - Never What is the last grade level you completed in school?: [place]?: No  Past Medical History:  Diagnosis Date   Abdominal wall hernia 01/2017   Atrial fibrillation (HCC)    CHF (congestive heart failure) ([place])    CKD (chronic kidney disease)    COPD (chronic obstructive pulmonary disease) ([place])    Coronary artery disease     NON-CRITICAL   Ectasis aorta ([place])    GERD (gastroesophageal reflux disease)    Gout    Hypertension    Memory difficulties 12/02/2013   Pacemaker    Psychosis ([place])    Tardive dyskinesia 12/02/2013   Type 2 diabetes mellitus ([place])    Past Surgical History:  Procedure Laterality Date   CARDIAC CATHETERIZATION  2007   Non-critical.    CHOLECYSTECTOMY     ELBOW SURGERY     EP IMPLANTABLE DEVICE N/A 08/31/2014   Procedure: PPM Generator Changeout;  Surgeon: Sanda Klein, MD;  Location: [facility];  Service: Cardiovascular;  Laterality: N/A;   INSERT / REPLACE / REMOVE PACEMAKER  2007   [place]/SYMPTOMATIC HEART BLOCK   PACEMAKER PLACEMENT  09/28/2005   2/2 SSS?   Persantine stress test  05/01/2010   EF 66%. Normal LV sys fx. Unchanged from previous studies.    TRANSTHORACIC ECHOCARDIOGRAM  09/08/10   SEVERE CONCENTRIC HYPERTROPHY.LV FUNCTION WAS VIGOROUS.EF 65%-70%.VENTRICULAR SEPTUM-INCOORDINATE MOTION.LEFT ATRIUM-MILDLY DILATED.TRIVIAL TR.   TUBAL LIGATION     Family History  Problem Relation Age of Onset   Heart disease Mother    Heart disease Father    Stroke Sister    Diabetes type II Son    Colon cancer Neg Hx    Social History   Socioeconomic History   Marital status: Divorced    Spouse name: Not on file   Number of children: 3   Years of education: 14   Highest education level: Associate degree: academic program  Occupational History   Not on file  Tobacco Use   Smoking status: Every Day    Packs/day: 0.25    Types: Cigarettes   Smokeless tobacco: Never   Tobacco comments:    6 cigs a day  Vaping Use   Vaping Use: Some days  Substance and Sexual Activity   Alcohol use: No    Alcohol/week: 0.0 standard drinks   Drug use: No   Sexual activity: Not Currently    Birth control/protection: Surgical  Other Topics Concern   Not on file  Social History Narrative   Patient lives with her son in [place] in a one story apartment.    Son is  patients caregiver ([name].)    Patient is wheelchair bound, except for bedtime.    Patient enjoys listing to music and visiting family.    Social Determinants of Health   Financial Resource Strain: Low Risk    Difficulty of Paying Living Expenses: Not hard at all  Food Insecurity: No Food Insecurity   Worried About Charity fundraiser in the Last Year: Never true   [place] in the Last Year: Never true  Transportation Needs: No Transportation Needs   Lack of Transportation (Medical): No   Lack of Transportation (Non-Medical): No  Physical Activity: Inactive   Days of Exercise per Week: 0 days   Minutes of Exercise per Session: 0 min  Stress: No Stress Concern Present   Feeling of Stress : Only a little  Social Connections: Socially Isolated   Frequency of Communication with Friends and Family: More than three times a week   Frequency of Social Gatherings with Friends and Family: More than three times a week   Attends Religious Services: Never   Marine scientist or Organizations: No   Attends Archivist Meetings: Never   Marital Status: Divorced   Outpatient Encounter Medications as of 12/27/2020  Medication Sig   acetaminophen (TYLENOL) 500 MG tablet Take 1,000 mg by mouth every 6 (six) hours as needed for moderate pain.    albuterol (PROVENTIL) (2.5 MG/3ML) 0.083% nebulizer solution Take 3 mLs (2.5 mg total) by nebulization every 6 (six) hours as needed for wheezing or shortness of breath.   albuterol (VENTOLIN HFA) 108 (90 Base) MCG/ACT inhaler Inhale 2 puffs into the lungs every 6 (six) hours as needed for wheezing or shortness of breath.   allopurinol (ZYLOPRIM) 100 MG tablet Take 1 tablet (100 mg total) by mouth daily.   amLODipine (NORVASC) 5 MG tablet Take 1 tablet (5 mg total) by mouth daily.   apixaban (ELIQUIS) 5 MG TABS tablet Take 1 tablet (5 mg total) by mouth 2 (two) times daily.   Blood Glucose Monitoring Suppl (ONETOUCH VERIO) w/Device KIT Check  blood sugar once daily   cholecalciferol (VITAMIN D3) 25 MCG (1000 UNIT) tablet Take 1 tablet (1,000 Units total) by mouth daily.   diclofenac Sodium (VOLTAREN) 1 % GEL Apply 2 g topically 4 (four) times daily as needed (pain).   divalproex (DEPAKOTE ER) 250 MG 24 hr tablet Take 250 mg by mouth at bedtime.    docusate sodium (COLACE) 100 MG capsule Take 1 capsule (100 mg total) by mouth 2 (two) times daily.   glucose blood (ONETOUCH VERIO) test strip Please use to check blood sugar once daily. E11.9   hydrocortisone cream 0.5 % Apply 1 application topically 2 (two) times daily. Apply WITH Nystatin under left breast   Incontinence Supplies MISC 1 Units by  Does not apply route as needed.   linagliptin (TRADJENTA) 5 MG TABS tablet Take 1 tablet (5 mg total) by mouth daily.   metoprolol tartrate (LOPRESSOR) 50 MG tablet Take 2 tablets (100 mg total) by mouth 2 (two) times daily. (Patient taking differently: Take 100 mg by mouth. Take 1 tablet in the morning and 2 tablets at bedtime)   mupirocin ointment (BACTROBAN) 2 % Apply 1 application topically 2 (two) times daily. To affected area   nitroGLYCERIN (NITROSTAT) 0.4 MG SL tablet PLACE ONE TABLET UNDER THE TONGUE EVERY FIVE MINUTES FOR THREE DOSES AS NEEDED FOR CHEST PAIN. CALL 911 AFTER THAT   nystatin cream (MYCOSTATIN) Apply 1 application topically 2 (two) times daily.   OLANZapine (ZYPREXA) 10 MG tablet Take 10 mg by mouth at bedtime.   olmesartan (BENICAR) 20 MG tablet Take 20 mg by mouth daily.   OneTouch Delica Lancets 93G MISC Use to test once daily.  DX code:E11.9   pravastatin (PRAVACHOL) 40 MG tablet TAKE ONE TABLET BY MOUTH EVERY EVENING   sodium bicarbonate 650 MG tablet Take 1 tablet (650 mg total) by mouth 2 (two) times daily.   glucose blood test strip Test each morning before breakfast (Patient not taking: Reported on 12/27/2020)   Lancet Device MISC 1 Device by Does not apply route 3 (three) times a week. (Patient not taking:  Reported on 12/27/2020)   No facility-administered encounter medications on file as of 12/27/2020.   Activities of Daily Living In your present state of health, do you have any difficulty performing the following activities: 12/27/2020  Hearing? N  Vision? N  Difficulty concentrating or making decisions? Y  Walking or climbing stairs? Y  Dressing or bathing? Y  Doing errands, shopping? Y  Preparing Food and eating ? Y  Using the Toilet? Y  In the past six months, have you accidently leaked urine? Y  Do you have problems with loss of bowel control? Y  Managing your Medications? Y  Managing your Finances? Y  Housekeeping or managing your Housekeeping? Y  Some recent data might be hidden   Patient Care Team: Martyn Malay, MD as PCP - General (Family Medicine) Sanda Klein, MD as PCP - Cardiology (Cardiology) Alan Ripper as Referring Physician (Physician Assistant) Alda Berthold, DO as Consulting Physician (Neurology)    Assessment:   This is a routine wellness examination for Abigail Wallace.  Exercise Activities and Dietary recommendations Current Exercise Habits: The patient does not participate in regular exercise at present, Exercise limited by: psychological condition(s);orthopedic condition(s)   Goals       Acknowledge receipt of Advanced Directive package     Patient would like packet mailed to her.  Education provided on completing packet.       Quit Smoking     Patient reports she smokes ~6 cigs per day.  Patient reports she is under a lot of stress with family illness.          Fall Risk Fall Risk  12/27/2020 11/21/2020 09/05/2020 03/11/2020 12/07/2019  Falls in the past year? 1 1 0 0 0  Number falls in past yr: 1 0 0 0 0  Injury with Fall? 0 0 0 0 0  Risk for fall due to : Impaired mobility;History of fall(s) - - - -  Follow up Falls prevention discussed - - - -   Patient is wheelchair bound except for sleeping.   Is the patient's home free of loose  throw rugs in walkways,  pet beds, electrical cords, etc?   yes      Grab bars in the bathroom? yes      Handrails on the stairs?   yes      Adequate lighting?   yes  Depression Screen PHQ 2/9 Scores 12/27/2020 12/27/2020 03/11/2020 12/07/2019  PHQ - 2 Score 0 0 0 0  PHQ- 9 Score 0 - 0 2    Cognitive Function MMSE - Mini Mental State Exam 12/02/2013  Orientation to time 4  Orientation to Place 5  Registration 3  Attention/ Calculation 4  Recall 2  Language- name 2 objects 2  Language- repeat 0  Language- follow 3 step command 3  Language- read & follow direction 1  Write a sentence 1  Copy design 0  Total score 25   6CIT Screen 12/27/2020  What Year? 0 points  What month? 0 points  What time? 0 points  Count back from 20 2 points  Months in reverse 2 points  Repeat phrase 2 points  Total Score 6   Immunization History  Administered Date(s) Administered   Influenza Whole 12/05/2006, 10/10/2007, 11/22/2009   Pneumococcal Polysaccharide-23 08/15/1997   Td 10/16/2002   Qualifies for Shingles Vaccine? Yes   Zostavax completed No   Shingrix Completed?: No. Education has been provided regarding the importance of this vaccine. Patient has been advised to call insurance company to determine out of pocket expense if they have not yet received this vaccine. Advised may also receive vaccine at local pharmacy or Health Dept. Verbalized acceptance and understanding.  Covid Vaccine Series Completed: No  Education has been provided regarding the importance of this vaccine. Advised may receive vaccine at local pharmacy or Health Dept or at [facility]. Patient verbalized acceptance and understanding.  Screening Tests Health Maintenance  Topic Date Due   COVID-19 Vaccine (1) Never done   Zoster Vaccines- Shingrix (1 of 2) Never done   Pneumonia Vaccine 58+ Years old (2 - PCV) 08/16/1998   TETANUS/TDAP  10/15/2012   OPHTHALMOLOGY EXAM  06/23/2019   FOOT EXAM  01/26/2020   INFLUENZA  VACCINE  04/14/2021 (Originally 08/15/2020)   DEXA SCAN  05/30/2021 (Originally 02/09/2008)   HEMOGLOBIN A1C  05/28/2021   Hepatitis C Screening  Completed   HPV VACCINES  Aged Out   Cancer Screenings: Lung: Low Dose CT Chest recommended if Age 31-80 years, 30 pack-year currently smoking OR have quit w/in 15years. Patient does qualify. 03/03/2020- FU in one year Breast: Up to date on Mammogram? 2004   Up to date of Bone Density/Dexa? Postponed until 2023. Colorectal: Declined  Additional Screenings: Hepatitis C Screening: Completed  HIV Screening: Completed   Plan:  Good luck with upcoming procedure!  PCP apt needs to be scheduled for February.  Call after your surgery to make this.  I will mail you an advance directive per discussion.   I have personally reviewed and noted the following in the patients chart:   Medical and social history Use of alcohol, tobacco or illicit drugs  Current medications and supplements Functional ability and status Nutritional status Physical activity Advanced directives List of other physicians Hospitalizations, surgeries, and ER visits in previous 12 months Vitals Screenings to include cognitive, depression, and falls Referrals and appointments  In addition, I have reviewed and discussed with patient certain preventive protocols, quality metrics, and best practice recommendations. A written personalized care plan for preventive services as well as general preventive health recommendations were provided to patient.  This visit was conducted  virtually in the setting of the [place] pandemic.    Dorna Bloom, [place]  12/27/2020

## 2020-12-28 ENCOUNTER — Telehealth: Payer: Self-pay

## 2020-12-28 NOTE — Telephone Encounter (Signed)
Call son in regards to AWV.

## 2020-12-28 NOTE — Telephone Encounter (Signed)
Attempted to call about questions. Left generic voicemail.  [name], MD  Family Medicine Teaching Service

## 2020-12-28 NOTE — Telephone Encounter (Signed)
Called patient, answered a few questions about upcoming visits.

## 2020-12-29 ENCOUNTER — Encounter: Payer: Self-pay | Admitting: Family Medicine

## 2020-12-29 NOTE — Telephone Encounter (Signed)
[name] (son) called back and said he was returning call to Dr. [name]. Christen Bame, CMA

## 2020-12-29 NOTE — Telephone Encounter (Signed)
Called son to follow up. Requested information from Medicare about AWV. Sent online link from CMS.  All questions answered.  [name], MD  Family Medicine Teaching Service

## 2021-01-02 ENCOUNTER — Ambulatory Visit (INDEPENDENT_AMBULATORY_CARE_PROVIDER_SITE_OTHER): Payer: Medicare Other

## 2021-01-02 DIAGNOSIS — I495 Sick sinus syndrome: Secondary | ICD-10-CM | POA: Diagnosis not present

## 2021-01-03 ENCOUNTER — Other Ambulatory Visit: Payer: Self-pay | Admitting: *Deleted

## 2021-01-03 DIAGNOSIS — J449 Chronic obstructive pulmonary disease, unspecified: Secondary | ICD-10-CM

## 2021-01-03 MED ORDER — ALBUTEROL SULFATE HFA 108 (90 BASE) MCG/ACT IN AERS: 2.0000 | INHALATION_SPRAY | Freq: Four times a day (QID) | RESPIRATORY_TRACT | 3 refills | Status: DC | PRN

## 2021-01-04 LAB — CUP PACEART REMOTE DEVICE CHECK
Battery Impedance: 747 Ohm
Battery Remaining Longevity: 65 mo
Battery Voltage: 2.78 V
Brady Statistic AP VP Percent: 71 %
Brady Statistic AP VS Percent: 0 %
Brady Statistic AS VP Percent: 26 %
Brady Statistic AS VS Percent: 4 %
Date Time Interrogation Session: 20221220154622
Implantable Lead Implant Date: 20070414
Implantable Lead Implant Date: 20070914
Implantable Lead Location: 753859
Implantable Lead Location: 753860
Implantable Lead Model: 4092
Implantable Lead Model: 5594
Implantable Pulse Generator Implant Date: 20160816
Lead Channel Impedance Value: 516 Ohm
Lead Channel Impedance Value: 614 Ohm
Lead Channel Pacing Threshold Amplitude: 0.625 V
Lead Channel Pacing Threshold Amplitude: 0.875 V
Lead Channel Pacing Threshold Pulse Width: 0.4 ms
Lead Channel Pacing Threshold Pulse Width: 0.4 ms
Lead Channel Setting Pacing Amplitude: 2 V
Lead Channel Setting Pacing Amplitude: 2.5 V
Lead Channel Setting Pacing Pulse Width: 0.4 ms
Lead Channel Setting Sensing Sensitivity: 4 mV

## 2021-01-12 NOTE — Progress Notes (Signed)
Remote pacemaker transmission.   

## 2021-01-13 ENCOUNTER — Encounter: Payer: Self-pay | Admitting: Family Medicine

## 2021-01-13 DIAGNOSIS — N189 Chronic kidney disease, unspecified: Secondary | ICD-10-CM | POA: Diagnosis not present

## 2021-01-13 DIAGNOSIS — E1122 Type 2 diabetes mellitus with diabetic chronic kidney disease: Secondary | ICD-10-CM | POA: Diagnosis not present

## 2021-01-13 DIAGNOSIS — I129 Hypertensive chronic kidney disease with stage 1 through stage 4 chronic kidney disease, or unspecified chronic kidney disease: Secondary | ICD-10-CM | POA: Diagnosis not present

## 2021-01-13 DIAGNOSIS — N2581 Secondary hyperparathyroidism of renal origin: Secondary | ICD-10-CM | POA: Diagnosis not present

## 2021-01-13 DIAGNOSIS — N1832 Chronic kidney disease, stage 3b: Secondary | ICD-10-CM | POA: Diagnosis not present

## 2021-01-13 DIAGNOSIS — D631 Anemia in chronic kidney disease: Secondary | ICD-10-CM | POA: Diagnosis not present

## 2021-01-16 DIAGNOSIS — M1990 Unspecified osteoarthritis, unspecified site: Secondary | ICD-10-CM | POA: Diagnosis not present

## 2021-01-18 ENCOUNTER — Encounter (HOSPITAL_COMMUNITY): Payer: Self-pay | Admitting: Cardiovascular Disease

## 2021-01-19 ENCOUNTER — Telehealth: Payer: Self-pay

## 2021-01-19 ENCOUNTER — Telehealth: Payer: Self-pay | Admitting: Cardiovascular Disease

## 2021-01-19 NOTE — Telephone Encounter (Signed)
Called pt and let her know Cardioversions are preformed all the time at the hospital. She was satisfied with my answer and wanted to know if her pacer was still "defibbing" I was unable to answer that questions. I will get this message over to Dr. Sallyanne Kuster and his nurse for advise.

## 2021-01-19 NOTE — Telephone Encounter (Signed)
[name] is calling requesting to speak with Abigail Wallace about her chances of making it through the Cardioversion that is scheduled for 01/27/21 to be done by Dr. Audie Box.

## 2021-01-19 NOTE — Telephone Encounter (Signed)
Patient calls nurse line asking to speak with PCP. Patient reports chest congestion and mild wheezing for the past "few" days. Patient denies any fevers, body aches or SOB. Patient is speaking in full sentences. Patient reports she saw Dr. Posey Pronto and mentioned symptoms and he suggested Mucinex. Patient reports she bought this, however threw the box away and can not give any additional information. Patient would like to know from PCP how often she should take this. How many times a day and for how long?  Conservative measures given to patient for symptom management. Red flags discussed with patient.   Will forward to PCP.

## 2021-01-20 ENCOUNTER — Telehealth: Payer: Self-pay | Admitting: Family Medicine

## 2021-01-20 NOTE — Telephone Encounter (Signed)
Covering for Dr. [name], called patient back after message left.  She notes that for the past 2 weeks she has had some congestion in her nose that she feels like drips down to her chest.  She denies chest pain, shortness of breath, fevers, body aches, chills.  She is speaking in complete sentences.  She notes she saw Dr Posey Pronto (nephrology) and he recommended some Mucinex which she has been taking 1 tab once a day and feels this is helping.  Overall she feels like her symptoms are improving and not getting worse.  She is morning if it is okay to continue the Mucinex.  I offered her a clinic visit to be seen, she declined.  I said that it is okay to continue taking the Mucinex once a day.  Discussed warning signs and symptoms including worsening shortness of breath, fevers, sputum production, chills, chest pain, heart racing, leg swelling.  She denies any of these notes she will be on the look out for them.  No sick contacts.  All questions answered and addressed. Yehuda Savannah MD.

## 2021-01-23 ENCOUNTER — Encounter: Payer: Self-pay | Admitting: Family Medicine

## 2021-01-23 ENCOUNTER — Telehealth: Payer: Self-pay | Admitting: Family Medicine

## 2021-01-23 ENCOUNTER — Telehealth: Payer: Self-pay | Admitting: Cardiovascular Disease

## 2021-01-23 DIAGNOSIS — I480 Paroxysmal atrial fibrillation: Secondary | ICD-10-CM | POA: Diagnosis not present

## 2021-01-23 DIAGNOSIS — W19XXXA Unspecified fall, initial encounter: Secondary | ICD-10-CM

## 2021-01-23 LAB — CBC
Hematocrit: 39.3 % (ref 34.0–46.6)
Hemoglobin: 13.3 g/dL (ref 11.1–15.9)
MCH: 28.2 pg (ref 26.6–33.0)
MCHC: 33.8 g/dL (ref 31.5–35.7)
MCV: 83 fL (ref 79–97)
Platelets: 303 10*3/uL (ref 150–450)
RBC: 4.71 x10E6/uL (ref 3.77–5.28)
RDW: 14.2 % (ref 11.7–15.4)
WBC: 13.3 10*3/uL — ABNORMAL HIGH (ref 3.4–10.8)

## 2021-01-23 LAB — BASIC METABOLIC PANEL
BUN/Creatinine Ratio: 18 (ref 12–28)
BUN: 30 mg/dL — ABNORMAL HIGH (ref 8–27)
CO2: 18 mmol/L — ABNORMAL LOW (ref 20–29)
Calcium: 9.9 mg/dL (ref 8.7–10.3)
Chloride: 102 mmol/L (ref 96–106)
Creatinine, Ser: 1.66 mg/dL — ABNORMAL HIGH (ref 0.57–1.00)
Glucose: 120 mg/dL — ABNORMAL HIGH (ref 70–99)
Potassium: 5.3 mmol/L — ABNORMAL HIGH (ref 3.5–5.2)
Sodium: 135 mmol/L (ref 134–144)
eGFR: 32 mL/min/{1.73_m2} — ABNORMAL LOW (ref >59)

## 2021-01-23 NOTE — Telephone Encounter (Signed)
Patient's son calling for instructions for the procedure and with questions on lab work needed.

## 2021-01-23 NOTE — Telephone Encounter (Signed)
Attempted to call patient about mychart message.Left very generic voicemail to call back.    Mychart was sent 1/8 but received today---I do still recommend non-contrast head CT as soon as possible--especially since having procedure on Friday. If son calls back, please let him know I still recommend. I have ordered as stat. Will also call later today.  [name], MD  Family Medicine Teaching Service

## 2021-01-23 NOTE — Telephone Encounter (Signed)
Called patient and son to discuss head CT.  The patient is on apixaban and had a fall on Christmas Day.  Recommended CT imaging is soon as possible.  Patient has no symptoms currently.  CT has been ordered.  Nursing patient and family would be able to do CT scan between 530 and 6:30 PM or another evening hours on ideally Wednesday or Thursday. Can you please help schedule?    Strict return precautions were given to the patient and her son.  Thanks [name], MD  Family Medicine Teaching Service

## 2021-01-23 NOTE — Telephone Encounter (Signed)
Son of patient called. He would like another copy of the patient's pre-procedure instructions sent to him via MyChart.

## 2021-01-23 NOTE — Telephone Encounter (Signed)
Called and schedule upcoming CT.  [facility] 01/26/2021 1730  Notified patient's son Jori Moll.  Ozella Almond, [place]

## 2021-01-23 NOTE — Telephone Encounter (Signed)
Left message to call back  

## 2021-01-23 NOTE — Telephone Encounter (Signed)
Abigail Wallace DoB: Oct 10, 1945 12/22/2020 11:20 AM [facility] [phone] Abigail Wallace (MRN: [id number])  Printed at 12/22/2020 11:49 AM Page 1 of 7 [place] AFTER VISIT SUMMARY Medication Instructions: No changes *If you need a refill on your cardiac medications before your next appointment, please call your pharmacy* Follow-Up: At [facility], you and your health needs are our priority. As part of our continuing mission to provide you with exceptional heart care, we have created designated Provider Care Teams. These Care Teams include your primary Cardiologist (physician) and Advanced Practice Providers (APPs - Physician Assistants and Nurse Practitioners) who all work together to provide you with the care you need, when you need it. We recommend signing up for the patient portal called "MyChart". Sign up information is provided on this After Visit Summary. MyChart is used to connect with patients for Virtual Visits (Telemedicine). Patients are able to view lab/test results, encounter notes, upcoming appointments, etc. Non-urgent messages can be sent to your provider as well. To learn more about what you can do with MyChart, go to [email or web address]. Your next appointment: 3 week(s) after your cardioversion on 01/27/21 (on a device day) The format for your next appointment: In Person Provider: Sanda Klein, MD Other Instructions You are scheduled for a Cardioversion on 01/27/2021 with Dr. Audie Box. Please arrive at the [facility] (Main Entrance A) at [facility]: [place], [place] [address] at 2:30 pm. (1 hour prior to procedure) DIET: Nothing to eat or drink after midnight except a sip of water with medications (see medication instructions below) Instructions from Sanda Klein, MD You saw Sanda Klein, MD on Thursday December 22, 2020. The following issue was addressed: Paroxysmal atrial fibrillation ([place]). Basic  metabolic panel for Paroxysmal atrial fibrillation (HCC) CBC for Paroxysmal atrial fibrillation (HCC) EKG 12-Lead for Paroxysmal atrial fibrillation (HCC) 128/60 Blood Pressure 42.13 BMI 261 lb Weight '5\' 6"'  Height 70 Pulse 20 Respiration 97% Oxygen Saturation Done Today We now offer e-Visits for anyone 46 and older to request care online for nonurgent symptoms. For details visit [email or web address]. Also download the MyChart app! Go to the app store, search "MyChart", open the app, select [place], and log in with your MyChart username and password. Abigail Wallace (MRN: [id number])  Printed at 12/22/2020 11:49 AM Page 2 of 7 [place] [facility] Instructions (continued) from Sanda Klein, MD FYI: For your safety, and to allow [place] to monitor your vital signs accurately during the surgery/procedure we request that if you have artificial nails, gel coating, SNS etc. Please have those removed prior to your surgery/procedure. Not having the nail coverings /polish removed may result in cancellation or delay of your surgery/procedure. Medication Instructions: Hold: Nothing to hold Continue your anticoagulant: Eliquis You will need to continue your anticoagulant after your procedure until you are told by your provider that it is safe to stop Labs: Your provider would like for you to return 01/21/20 to have the following labs drawn: CBC and BMET. You do not need an appointment for the lab. Once in our office lobby there is a podium where you can sign in and ring the doorbell to alert [place] that you are here. The lab is open from 8:00 am to 4:30 pm; closed for lunch from 12:45pm-1:45pm. You must have a responsible person to drive you home and stay in the waiting area during your procedure. Failure to do so could result in cancellation. Bring your insurance cards. *Special Note: Every  effort is made to have your procedure done on time. Occasionally there are  emergencies that occur at the hospital that may cause delays. Please be patient if a delay does occur. See your updated medication list for details. Dec 27 2020 Medicare Annual Wellness Visit, Initial with CMA Shon Millet, [facility] Tuesday December 13 10:00 AM [place] [address] [id number] [place] [address] [phone] [place] [phone] 2022 REMOTE PACER CHECK Monday December 19 11:15 AM [facility] [address], [address] [place] [address] [phone] CARDIOVERSION with Evalina Field, MD [facility] ENDOSCOPY Your medications have changed today What's Next Abigail Wallace (MRN: [id number])  Printed at 12/22/2020 11:49 AM Page 3 of 7 [facility] Next (continued) [facility] 20 2023 REMOTE PACER CHECK Monday March 20 11:15 AM [facility] [address] [place] [address] [phone] Showing your appointments through April 03, 2021. You have more appointments scheduled after these. Martyn Malay, MD Abilify [aripiprazole] Anaphylaxis, Shortness Of Breath, Other (See Comments) Tremors Clozapine Anaphylaxis Benadryl [diphenhydramine Hcl] Other (See Comments) States affects her vision Cogentin [benztropine] Other (See Comments) Affects mobility Haloperidol Lactate Other (See Comments) Vision changes, Shaking, Drooling Latuda [lurasidone Hcl] Other (See Comments) "Tremors and shakes." Lorazepam Other reaction(s): Unknown Remeron [mirtazapine] Other (See Comments) Tremors and shakes Keflex [cephalexin] Per patient, has tongue swelling with keflex but can tolerate amoxicillin Codeine Other (See Comments) Unknown Latex Rash [facility] [place], [place] Visit Pharmacy Allergies Your Primary Care Provider Abigail Wallace (MRN: [id number])  Printed at 12/22/2020 11:49 AM Page 4 of 7 [place] For facts/information regarding the  Covid-19 vaccines, to schedule an appointment, or find walk-in vaccine clinic locations, visit [email or web address] or use the following link: Get the Facts: Covid-19 Vaccines. Covid-19 Vaccination Get Care Now Abigail Wallace (MRN: [id number])  Printed at 12/22/2020 11:49 AM Page 5 of 7 [place] care planning is a way to make decisions about medical care that fits your values in case you are ever unable to make these decisions for yourself. Login to MyChart for advance care planning support such as additional information on advance care planning, view your advance care planning documents on file in your medical record, get resources on having advance care planning conversations, download forms or schedule an appointment with your healthcare provider. Suicide Resources Who to Call Call [place] at [phone]; [phone] More Resources Suicide Awareness Voices of Education 701-249-5948 www.save.[place] on Mental Illness(NAMI) (800) 950-NAMI www.nami.org American Association of Suicidology 305-722-3723 [email or web address].Abigail Wallace (MRN: [id number])  Printed at 12/22/2020 11:49 AM Page 6 of 7 [place] suggestion Always use your most recent med list. acetaminophen 500 MG tablet Commonly known as: TYLENOL Take 1,000 mg by mouth every 6 (six) hours as needed for moderate pain. * albuterol 108 (90 Base) MCG/ACT inhaler Commonly known as: VENTOLIN HFA Inhale 2 puffs into the lungs every 6 (six) hours as needed for wheezing or shortness of breath. * albuterol (2.5 MG/3ML) 0.083% nebulizer solution Commonly known as: PROVENTIL Take 3 mLs (2.5 mg total) by nebulization every 6 (six) hours as needed for wheezing or shortness of breath. allopurinol 100 MG tablet Commonly known as: ZYLOPRIM Take 1 tablet  (100 mg total)  by mouth daily. amLODipine 5 MG tablet Commonly known as: NORVASC Take 1 tablet (5 mg total) by mouth daily. cholecalciferol 25 MCG (1000 UNIT) tablet Commonly known as: VITAMIN D3 Take 1 tablet (1,000 Units total) by mouth daily. diclofenac Sodium 1 % Gel Commonly known as: Voltaren Apply 2 g topically 4 (four) times daily as needed (pain). divalproex 250 MG 24 hr tablet Commonly known as: DEPAKOTE ER Take 250 mg by mouth at bedtime. docusate sodium 100 MG capsule Commonly known as: Colace Take 1 capsule (100 mg total) by mouth 2 (two) times daily. Eliquis 5 MG Tabs tablet Generic drug: apixaban Take 1 tablet (5 mg total) by mouth 2 (two) times daily. * glucose blood test strip Test each morning before breakfast * OneTouch Verio test strip Generic drug: glucose blood Please use to check blood sugar once daily. E11.9 hydrocortisone cream 0.5 % Apply 1 application topically 2 (two) times daily. Apply WITH Nystatin under left breast Incontinence Supplies Misc 1 Units by Does not apply route as needed. Lancet Device Misc 1 Device by Does not apply route 3 (three) times a week. linagliptin 5 MG Tabs tablet Commonly known as: TRADJENTA Take 1 tablet (5 mg total) by mouth daily. metoprolol tartrate 50 MG tablet Commonly known as: LOPRESSOR Take 2 tablets (100 mg total) by mouth 2 (two) times daily. According to our records, you may have been taking this medication differently. Your Medication List Your Medication List as of December 22, 2020 11:49 AM Abigail Wallace (MRN: [id number])  Printed at 12/22/2020 11:49 AM Page 7 of 7 [place] Your Medication List (continued) as of December 22, 2020 11:49 AM mupirocin ointment 2 % Commonly known as: Bactroban Apply 1 application topically 2 (two) times daily. To affected area nitroGLYCERIN 0.4 MG SL tablet Commonly known as: NITROSTAT PLACE ONE TABLET UNDER THE TONGUE EVERY FIVE MINUTES FOR THREE DOSES AS NEEDED  FOR CHEST PAIN. CALL 911 AFTER THAT nystatin cream Commonly known as: MYCOSTATIN Apply 1 application topically 2 (two) times daily. OLANZapine 10 MG tablet Commonly known as: ZYPREXA Take 10 mg by mouth at bedtime. olmesartan 20 MG tablet Commonly known as: BENICAR Take 20 mg by mouth daily. OneTouch Delica Lancets 12X Misc Use to test once daily. DX code:E11.9 OneTouch Verio w/Device Kit Check blood sugar once daily pravastatin 40 MG tablet Commonly known as: PRAVACHOL TAKE ONE TABLET BY MOUTH EVERY EVENING sodium bicarbonate 650 MG tablet Take 1 tablet (650 mg total) by mouth 2 (two) times daily. very important * This list has 4 medication(s) that are the same as other medications prescribed for you. Read the directions carefully, and ask your doctor or other care provider to review them with you.

## 2021-01-24 ENCOUNTER — Other Ambulatory Visit: Payer: Self-pay | Admitting: *Deleted

## 2021-01-24 ENCOUNTER — Encounter: Payer: Self-pay | Admitting: *Deleted

## 2021-01-24 DIAGNOSIS — I4819 Other persistent atrial fibrillation: Secondary | ICD-10-CM

## 2021-01-24 NOTE — Telephone Encounter (Signed)
Spoke to the son, per dpr. Instructions have also been sent to the patient's MyChart.

## 2021-01-26 ENCOUNTER — Other Ambulatory Visit: Payer: Self-pay

## 2021-01-26 ENCOUNTER — Ambulatory Visit (HOSPITAL_COMMUNITY)
Admission: RE | Admit: 2021-01-26 | Discharge: 2021-01-26 | Disposition: A | Discharge status: Home / Self Care | Payer: Medicare Other | Source: Ambulatory Visit | Attending: Family Medicine | Admitting: Family Medicine

## 2021-01-26 ENCOUNTER — Telehealth: Payer: Self-pay | Admitting: *Deleted

## 2021-01-26 DIAGNOSIS — W19XXXA Unspecified fall, initial encounter: Secondary | ICD-10-CM | POA: Diagnosis not present

## 2021-01-26 DIAGNOSIS — Z7901 Long term (current) use of anticoagulants: Secondary | ICD-10-CM | POA: Diagnosis not present

## 2021-01-26 DIAGNOSIS — I6381 Other cerebral infarction due to occlusion or stenosis of small artery: Secondary | ICD-10-CM | POA: Diagnosis not present

## 2021-01-26 DIAGNOSIS — S098XXA Other specified injuries of head, initial encounter: Secondary | ICD-10-CM | POA: Insufficient documentation

## 2021-01-26 NOTE — Telephone Encounter (Signed)
Pt called in wanting to know if dr brown would be able to sit in during her conversion. I let her know she had clinic. Rainie Crenshaw Kennon Holter, CMA

## 2021-01-26 NOTE — Telephone Encounter (Signed)
Called patient and son---CT without acute change. She is prepared for procedure tomorrow.   [name], MD  Family Medicine Teaching Service

## 2021-01-27 ENCOUNTER — Ambulatory Visit (HOSPITAL_COMMUNITY)
Admission: RE | Admit: 2021-01-27 | Discharge: 2021-01-27 | Disposition: A | Discharge status: Home / Self Care | Payer: Medicare Other | Attending: Cardiovascular Disease | Admitting: Cardiovascular Disease

## 2021-01-27 ENCOUNTER — Encounter (HOSPITAL_COMMUNITY)
Admission: RE | Disposition: A | Discharge status: Home / Self Care | Payer: Self-pay | Source: Home / Self Care | Attending: Cardiovascular Disease

## 2021-01-27 ENCOUNTER — Ambulatory Visit (HOSPITAL_COMMUNITY): Payer: Medicare Other | Admitting: Anesthesiology

## 2021-01-27 ENCOUNTER — Encounter (HOSPITAL_COMMUNITY): Payer: Self-pay | Admitting: Cardiovascular Disease

## 2021-01-27 ENCOUNTER — Other Ambulatory Visit: Payer: Self-pay

## 2021-01-27 DIAGNOSIS — Z6841 Body Mass Index (BMI) 40.0 and over, adult: Secondary | ICD-10-CM | POA: Diagnosis not present

## 2021-01-27 DIAGNOSIS — Z79899 Other long term (current) drug therapy: Secondary | ICD-10-CM | POA: Diagnosis not present

## 2021-01-27 DIAGNOSIS — Z8249 Family history of ischemic heart disease and other diseases of the circulatory system: Secondary | ICD-10-CM | POA: Insufficient documentation

## 2021-01-27 DIAGNOSIS — I2510 Atherosclerotic heart disease of native coronary artery without angina pectoris: Secondary | ICD-10-CM | POA: Diagnosis not present

## 2021-01-27 DIAGNOSIS — Z7901 Long term (current) use of anticoagulants: Secondary | ICD-10-CM | POA: Diagnosis not present

## 2021-01-27 DIAGNOSIS — E785 Hyperlipidemia, unspecified: Secondary | ICD-10-CM | POA: Diagnosis not present

## 2021-01-27 DIAGNOSIS — I110 Hypertensive heart disease with heart failure: Secondary | ICD-10-CM | POA: Diagnosis not present

## 2021-01-27 DIAGNOSIS — G4730 Sleep apnea, unspecified: Secondary | ICD-10-CM | POA: Insufficient documentation

## 2021-01-27 DIAGNOSIS — I509 Heart failure, unspecified: Secondary | ICD-10-CM | POA: Diagnosis not present

## 2021-01-27 DIAGNOSIS — K219 Gastro-esophageal reflux disease without esophagitis: Secondary | ICD-10-CM | POA: Diagnosis not present

## 2021-01-27 DIAGNOSIS — Z993 Dependence on wheelchair: Secondary | ICD-10-CM | POA: Diagnosis not present

## 2021-01-27 DIAGNOSIS — F17210 Nicotine dependence, cigarettes, uncomplicated: Secondary | ICD-10-CM | POA: Insufficient documentation

## 2021-01-27 DIAGNOSIS — N189 Chronic kidney disease, unspecified: Secondary | ICD-10-CM | POA: Diagnosis not present

## 2021-01-27 DIAGNOSIS — Z950 Presence of cardiac pacemaker: Secondary | ICD-10-CM | POA: Insufficient documentation

## 2021-01-27 DIAGNOSIS — E1122 Type 2 diabetes mellitus with diabetic chronic kidney disease: Secondary | ICD-10-CM | POA: Diagnosis not present

## 2021-01-27 DIAGNOSIS — J449 Chronic obstructive pulmonary disease, unspecified: Secondary | ICD-10-CM | POA: Diagnosis not present

## 2021-01-27 DIAGNOSIS — I130 Hypertensive heart and chronic kidney disease with heart failure and stage 1 through stage 4 chronic kidney disease, or unspecified chronic kidney disease: Secondary | ICD-10-CM | POA: Diagnosis not present

## 2021-01-27 DIAGNOSIS — I4891 Unspecified atrial fibrillation: Secondary | ICD-10-CM | POA: Diagnosis not present

## 2021-01-27 DIAGNOSIS — I4819 Other persistent atrial fibrillation: Secondary | ICD-10-CM

## 2021-01-27 DIAGNOSIS — Z7984 Long term (current) use of oral hypoglycemic drugs: Secondary | ICD-10-CM | POA: Insufficient documentation

## 2021-01-27 HISTORY — PX: CARDIOVERSION: SHX1299

## 2021-01-27 LAB — GLUCOSE, CAPILLARY: Glucose-Capillary: 109 mg/dL — ABNORMAL HIGH (ref 70–99)

## 2021-01-27 SURGERY — CARDIOVERSION
Anesthesia: General

## 2021-01-27 MED ORDER — SODIUM CHLORIDE 0.9 % IV SOLN: INTRAVENOUS | Status: DC

## 2021-01-27 MED ORDER — LIDOCAINE 2% (20 MG/ML) 5 ML SYRINGE
INTRAMUSCULAR | Status: DC | PRN
Start: 2021-01-27 — End: 2021-01-27
  Administered 2021-01-27: 60 mg via INTRAVENOUS

## 2021-01-27 MED ORDER — PROPOFOL 10 MG/ML IV BOLUS
INTRAVENOUS | Status: DC | PRN
Start: 2021-01-27 — End: 2021-01-27
  Administered 2021-01-27: 60 mg via INTRAVENOUS
  Administered 2021-01-27: 20 mg via INTRAVENOUS

## 2021-01-27 NOTE — Discharge Instructions (Signed)
Electrical Cardioversion  Electrical cardioversion is the delivery of a jolt of electricity to restore a normal rhythm to the heart. A rhythm that is too fast or is not regular keeps the heart from pumping well. In this procedure, sticky patches or metal paddles are placed on the chest to deliver electricity to the heart from a device.  What can I expect after the procedure?  Your blood pressure, heart rate, breathing rate, and blood oxygen level will be monitored until you leave the hospital or clinic.  Your heart rhythm will be watched to make sure it does not change.  You may have some redness on the skin where the shocks were given.If this occurs, can use hydrocortisone cream or Aloe vera.  Follow these instructions at home:  Do not drive for 24 hours if you were given a sedative during your procedure.  Take over-the-counter and prescription medicines only as told by your health care provider.  Ask your health care provider how to check your pulse. Check it often.  Rest for 48 hours after the procedure or as told by your health care provider.  Avoid or limit your caffeine use as told by your health care provider.  Keep all follow-up visits as told by your health care provider. This is important.  Contact a health care provider if:  You feel like your heart is beating too quickly or your pulse is not regular.  You have a serious muscle cramp that does not go away.  Get help right away if:  You have discomfort in your chest.  You are dizzy or you feel faint.  You have trouble breathing or you are short of breath.  Your speech is slurred.  You have trouble moving an arm or leg on one side of your body.  Your fingers or toes turn cold or blue.  Summary  Electrical cardioversion is the delivery of a jolt of electricity to restore a normal rhythm to the heart.  This procedure may be done right away in an emergency or may be a scheduled procedure if the condition is not  an emergency.  Generally, this is a safe procedure.  After the procedure, check your pulse often as told by your health care provider.  This information is not intended to replace advice given to you by your health care provider. Make sure you discuss any questions you have with your health care provider. Document Revised: 08/04/2018 Document Reviewed: 08/04/2018 Elsevier Patient Education  2021 [employer]  

## 2021-01-27 NOTE — H&P (Signed)
Cardiology Admission History and Physical:  Patient ID: Abigail Wallace MRN: [id number] DOB: 21-Dec-1945  Admit date: 01/27/2021  Primary Care Provider: Martyn Malay, MD Primary Cardiologist: Sanda Klein, MD  Primary Electrophysiologist:  None   Chief Complaint: Atrial fibrillation  Patient Profile:  Abigail Wallace is a 76 y.o. female with history of complete heart block status post pacemaker implantation, sleep apnea, morbid obesity, hyperlipidemia, schizophrenia who presents for elective cardioversion.  History of Present Illness:  Abigail Wallace follows with Dr. Carson Myrtle and was found to be in atrial fibrillation.  She takes Eliquis 5 mg twice daily.  No missed doses in the last 3 weeks.  She reports overall she is doing well.  Plans for elective cardioversion today.  Heart Pathway Score:       Past Medical History: Past Medical History:  Diagnosis Date   Abdominal wall hernia 01/2017   Atrial fibrillation (HCC)    CHF (congestive heart failure) (HCC)    CKD (chronic kidney disease)    COPD (chronic obstructive pulmonary disease) (HCC)    Coronary artery disease    NON-CRITICAL   Ectasis aorta (HCC)    GERD (gastroesophageal reflux disease)    Gout    Hypertension    Memory difficulties 12/02/2013   Pacemaker    Psychosis ([place])    Tardive dyskinesia 12/02/2013   Type 2 diabetes mellitus ([place])     Past Surgical History: Past Surgical History:  Procedure Laterality Date   CARDIAC CATHETERIZATION  2007   Non-critical.    CHOLECYSTECTOMY     ELBOW SURGERY     EP IMPLANTABLE DEVICE N/A 08/31/2014   Procedure: PPM Generator Changeout;  Surgeon: Sanda Klein, MD;  Location: [facility];  Service: Cardiovascular;  Laterality: N/A;   INSERT / REPLACE / REMOVE PACEMAKER  2007   [place]/SYMPTOMATIC HEART BLOCK   PACEMAKER PLACEMENT  09/28/2005   2/2 SSS?   Persantine stress test  05/01/2010   EF 66%. Normal LV sys fx. Unchanged from previous studies.     TRANSTHORACIC ECHOCARDIOGRAM  09/08/10   SEVERE CONCENTRIC HYPERTROPHY.LV FUNCTION WAS VIGOROUS.EF 65%-70%.VENTRICULAR SEPTUM-INCOORDINATE MOTION.LEFT ATRIUM-MILDLY DILATED.TRIVIAL TR.   TUBAL LIGATION       Medications Prior to Admission: Prior to Admission medications   Medication Sig Start Date End Date Taking? Authorizing Provider  acetaminophen (TYLENOL) 500 MG tablet Take 1,000 mg by mouth every 6 (six) hours as needed for moderate pain.    Yes [provider]  albuterol (PROVENTIL) (2.5 MG/3ML) 0.083% nebulizer solution Take 3 mLs (2.5 mg total) by nebulization every 6 (six) hours as needed for wheezing or shortness of breath. 06/30/20  Yes Martyn Malay, MD  albuterol (VENTOLIN HFA) 108 (90 Base) MCG/ACT inhaler Inhale 2 puffs into the lungs every 6 (six) hours as needed for wheezing or shortness of breath. 01/03/21  Yes Martyn Malay, MD  allopurinol (ZYLOPRIM) 100 MG tablet Take 1 tablet (100 mg total) by mouth daily. 07/02/20  Yes Martyn Malay, MD  amLODipine (NORVASC) 5 MG tablet Take 1 tablet (5 mg total) by mouth daily. Patient taking differently: Take 10 mg by mouth daily. 10/10/20  Yes O'Neal, Cassie Freer, MD  apixaban (ELIQUIS) 5 MG TABS tablet Take 1 tablet (5 mg total) by mouth 2 (two) times daily. 07/02/20 07/02/21 Yes Martyn Malay, MD  cholecalciferol (VITAMIN D3) 25 MCG (1000 UNIT) tablet Take 1 tablet (1,000 Units total) by mouth daily. 07/02/20  Yes Martyn Malay, MD  diclofenac Sodium (VOLTAREN) 1 % GEL Apply 2 g topically 4 (four) times daily as needed (pain). 03/11/20  Yes Martyn Malay, MD  divalproex (DEPAKOTE ER) 250 MG 24 hr tablet Take 250 mg by mouth at bedtime.  02/11/18  Yes [provider]  docusate sodium (COLACE) 100 MG capsule Take 1 capsule (100 mg total) by mouth 2 (two) times daily. Patient taking differently: Take 100 mg by mouth daily as needed for mild constipation. 08/09/20  Yes Martyn Malay, MD  hydrocortisone cream 0.5 %  Apply 1 application topically 2 (two) times daily. Apply WITH Nystatin under left breast Patient taking differently: Apply 1 application topically 2 (two) times daily as needed for itching. Apply WITH Nystatin under left breast 11/28/20  Yes Martyn Malay, MD  linagliptin (TRADJENTA) 5 MG TABS tablet Take 1 tablet (5 mg total) by mouth daily. 02/23/20  Yes Martyn Malay, MD  metoprolol tartrate (LOPRESSOR) 50 MG tablet Take 2 tablets (100 mg total) by mouth 2 (two) times daily. Patient taking differently: Take 50-100 mg by mouth See admin instructions. Take 1 table 50 mg in the morning and 2 tablets  ( 100 mg) at bedtime 07/26/20  Yes Martyn Malay, MD  nitroGLYCERIN (NITROSTAT) 0.4 MG SL tablet PLACE ONE TABLET UNDER THE TONGUE EVERY FIVE MINUTES FOR THREE DOSES AS NEEDED FOR CHEST PAIN. CALL 911 AFTER THAT Patient taking differently: 0.4 mg every 5 (five) minutes as needed for chest pain. 10/17/20  Yes Croitoru, Mihai, MD  nystatin cream (MYCOSTATIN) Apply 1 application topically 2 (two) times daily. 11/28/20  Yes Martyn Malay, MD  OLANZapine (ZYPREXA) 10 MG tablet Take 10 mg by mouth at bedtime. 06/08/14  Yes [provider]  olmesartan (BENICAR) 5 MG tablet Take 5 mg by mouth daily.   Yes [provider]  pravastatin (PRAVACHOL) 40 MG tablet TAKE ONE TABLET BY MOUTH EVERY EVENING Patient taking differently: Take 40 mg by mouth every evening. 11/07/20  Yes Croitoru, Mihai, MD  sodium bicarbonate 650 MG tablet Take 1 tablet (650 mg total) by mouth 2 (two) times daily. 07/02/20  Yes Martyn Malay, MD  umeclidinium-vilanterol [facility] ELLIPTA) 62.5-25 MCG/ACT AEPB Inhale 1 puff into the lungs daily.   Yes [provider]  Blood Glucose Monitoring Suppl (ONETOUCH VERIO) w/Device KIT Check blood sugar once daily 07/06/19   Martyn Malay, MD  glucose blood [facility] VERIO) test strip Please use to check blood sugar once daily. E11.9 07/15/20   Martyn Malay, MD  glucose  blood test strip Test each morning before breakfast Patient not taking: Reported on 12/27/2020 07/15/19   Martyn Malay, MD  Incontinence Supplies MISC 1 Units by Does not apply route as needed. 12/13/15   McKeag, Marylynn Pearson, MD  Lancet Device MISC 1 Device by Does not apply route 3 (three) times a week. Patient not taking: Reported on 12/27/2020 07/15/19   Martyn Malay, MD  OneTouch Delica Lancets 74B MISC Use to test once daily.  DX code:E11.9 07/15/20   Martyn Malay, MD     Allergies:    Allergies  Allergen Reactions   Abilify [Aripiprazole] Anaphylaxis, Shortness Of Breath and Other (See Comments)    Tremors   Clozapine Anaphylaxis   Benadryl [Diphenhydramine Hcl] Other (See Comments)    States affects her vision   Cogentin [Benztropine] Other (See Comments)    Affects mobility   Haloperidol Lactate Other (See Comments)    Vision changes, Shaking,  Drooling   Latuda [Lurasidone Hcl] Other (See Comments)    "Tremors and shakes."    Lorazepam     Other reaction(s): Unknown   Remeron [Mirtazapine] Other (See Comments)    Tremors and shakes   Keflex [Cephalexin]     Per patient, has tongue swelling with keflex but can tolerate amoxicillin   Codeine Other (See Comments)    Unknown    Latex Rash    Social History:   Social History   Socioeconomic History   Marital status: Divorced    Spouse name: Not on file   Number of children: 3   Years of education: 14   Highest education level: Associate degree: academic program  Occupational History   Not on file  Tobacco Use   Smoking status: Every Day    Packs/day: 0.25    Types: Cigarettes   Smokeless tobacco: Never   Tobacco comments:    6 cigs a day  Vaping Use   Vaping Use: Some days  Substance and Sexual Activity   Alcohol use: No    Alcohol/week: 0.0 standard drinks   Drug use: No   Sexual activity: Not Currently    Birth control/protection: Surgical  Other Topics Concern   Not on file  Social History Narrative    Patient lives with her son in [place] in a one story apartment.    Son is patients caregiver ([name].)    Patient is wheelchair bound, except for bedtime.    Patient enjoys listing to music and visiting family.    Social Determinants of Health   Financial Resource Strain: Low Risk    Difficulty of Paying Living Expenses: Not hard at all  Food Insecurity: No Food Insecurity   Worried About Charity fundraiser in the Last Year: Never true   [place] in the Last Year: Never true  Transportation Needs: No Transportation Needs   Lack of Transportation (Medical): No   Lack of Transportation (Non-Medical): No  Physical Activity: Inactive   Days of Exercise per Week: 0 days   Minutes of Exercise per Session: 0 min  Stress: No Stress Concern Present   Feeling of Stress : Only a little  Social Connections: Socially Isolated   Frequency of Communication with Friends and Family: More than three times a week   Frequency of Social Gatherings with Friends and Family: More than three times a week   Attends Religious Services: Never   Marine scientist or Organizations: No   Attends Music therapist: Never   Marital Status: Divorced  Human resources officer Violence: Not At Risk   Fear of Current or Ex-Partner: No   Emotionally Abused: No   Physically Abused: No   Sexually Abused: No     Family History:   The patient's family history includes Diabetes type II in her son; Heart disease in her father and mother; Stroke in her sister. There is no history of Colon cancer.    ROS:  All other ROS reviewed and negative. Pertinent positives noted in the HPI.     Physical Exam/Data:   Vitals:   01/27/21 1348  BP: (!) 175/96  Pulse: 72  Resp: 18  Temp: 98.2 F (36.8 C)  TempSrc: Temporal  SpO2: 98%  Weight: 118.4 kg  Height: _0  (1.676 m)   No intake or output data in the 24 hours ending 01/27/21 1351  Last 3 Weights 01/27/2021 12/22/2020 11/28/2020  Weight (lbs) 261  lb 0.4 oz  261 lb (No Data)  Weight (kg) 118.4 kg 118.389 kg (No Data)    Body mass index is 42.13 kg/m.  General: Well nourished, well developed, in no acute distress Head: Atraumatic, normal size  Eyes: PEERLA, EOMI  Neck: Supple, no JVD Endocrine: No thryomegaly Cardiac: Normal S1, S2; irregular rhythm, no murmurs Lungs: Clear to auscultation bilaterally, no wheezing, rhonchi or rales  Abd: Soft, nontender, no hepatomegaly  Ext: No edema, pulses 2+ Musculoskeletal: No deformities, BUE and BLE strength normal and equal Skin: Warm and dry, no rashes   Neuro: Alert and oriented to person, place, time, and situation, CNII-XII grossly intact, no focal deficits  Psych: Normal mood and affect    Relevant CV Studies: Device interrogation performed.  She is in atrial fibrillation.  Laboratory Data: High Sensitivity Troponin:  No results for input(s): TROPONINIHS in the last 720 hours.    Cardiac EnzymesNo results for input(s): TROPONINI in the last 168 hours. No results for input(s): TROPIPOC in the last 168 hours.  Chemistry Recent Labs  Lab 01/23/21 1135  NA 135  K 5.3*  CL 102  CO2 18*  GLUCOSE 120*  BUN 30*  CREATININE 1.66*  CALCIUM 9.9    No results for input(s): PROT, ALBUMIN, AST, ALT, ALKPHOS, BILITOT in the last 168 hours. Hematology Recent Labs  Lab 01/23/21 1135  WBC 13.3*  RBC 4.71  HGB 13.3  HCT 39.3  MCV 83  MCH 28.2  MCHC 33.8  RDW 14.2  PLT 303   BNPNo results for input(s): BNP, PROBNP in the last 168 hours.  DDimer No results for input(s): DDIMER in the last 168 hours.  Radiology/Studies:  CT HEAD WO CONTRAST (5MM)  Result Date: 01/26/2021 CLINICAL DATA:  [place] and hit head.  On apixaban. EXAM: CT HEAD WITHOUT CONTRAST TECHNIQUE: Contiguous axial images were obtained from the base of the skull through the vertex without intravenous contrast. RADIATION DOSE REDUCTION: This exam was performed according to the departmental dose-optimization program  which includes automated exposure control, adjustment of the mA and/or kV according to patient size and/or use of iterative reconstruction technique. COMPARISON:  CT head dated November 19, 2010. FINDINGS: Brain: No evidence of acute infarction, hemorrhage, hydrocephalus, extra-axial collection or mass lesion/mass effect. Unchanged small chronic lacunar infarct in the left thalamus. Vascular: Atherosclerotic vascular calcification of the carotid siphons. No hyperdense vessel. Skull: Normal. Negative for fracture or focal lesion. Sinuses/Orbits: No acute finding. Other: None. IMPRESSION: 1. No acute intracranial abnormality. Electronically Signed   By: Titus Dubin M.D.   On: 01/26/2021 17:26    Assessment and Plan:  Atrial fibrillation -She presents for elective cardioversion.  On Eliquis 5 mg twice daily.  No missed doses in the last 3 weeks.  She is NPO.  Risk and benefits explained.  She is willing to proceed.  Shared Decision Making/Informed Consent The risks (stroke, cardiac arrhythmias rarely resulting in the need for a temporary or permanent pacemaker, skin irritation or burns and complications associated with conscious sedation including aspiration, arrhythmia, respiratory failure and death), benefits (restoration of normal sinus rhythm) and alternatives of a direct current cardioversion were explained in detail to Abigail Wallace and she agrees to proceed.    Signed, Addison Naegeli. Audie Box, MD, [place]  01/27/2021 1:51 PM

## 2021-01-27 NOTE — CV Procedure (Signed)
° °  DIRECT CURRENT CARDIOVERSION  NAME:  Abigail Wallace    MRN: [id number] DOB:  05/10/1945    ADMIT DATE: 01/27/2021  Indication:  Symptomatic atrial fibrillation   Procedure Note:  The patient signed informed consent.  They have had had therapeutic anticoagulation with eliquis greater than 3 weeks.  Anesthesia was administered by Dr. Annye Asa.  Adequate airway was maintained throughout and vital followed per protocol.  They were cardioverted x 1 with 200J of biphasic synchronized energy.  They converted to NSR.  There were no apparent complications.  The patient had normal neuro status and respiratory status post procedure with vitals stable as recorded elsewhere.    Device interrogation performed by me after confirmed NSR.   Follow up: They will continue on current medical therapy and follow up with cardiology as scheduled.  [place] T. Audie Box, MD, [place]  [address], [place], [place] [address] [phone]  2:59 PM

## 2021-01-27 NOTE — Anesthesia Preprocedure Evaluation (Signed)
Anesthesia Evaluation  Patient identified by MRN, date of birth, ID band Patient awake    Reviewed: Allergy & Precautions, H&P , NPO status , Patient's Chart, lab work & pertinent test results  Airway Mallampati: II   Neck ROM: full    Dental   Pulmonary sleep apnea , COPD, Current Smoker,    breath sounds clear to auscultation       Cardiovascular hypertension, + CAD and +CHF  + dysrhythmias Atrial Fibrillation + pacemaker  Rhythm:irregular Rate:Normal     Neuro/Psych    GI/Hepatic GERD  ,  Endo/Other  diabetes, Type 2  Renal/GU Renal InsufficiencyRenal disease     Musculoskeletal  (+) Arthritis ,   Abdominal   Peds  Hematology   Anesthesia Other Findings   Reproductive/Obstetrics                             Anesthesia Physical Anesthesia Plan  ASA: 3  Anesthesia Plan: General   Post-op Pain Management:    Induction: Intravenous  PONV Risk Score and Plan: 2 and Propofol infusion and Treatment may vary due to age or medical condition  Airway Management Planned: Mask  Additional Equipment:   Intra-op Plan:   Post-operative Plan:   Informed Consent: I have reviewed the patients History and Physical, chart, labs and discussed the procedure including the risks, benefits and alternatives for the proposed anesthesia with the patient or authorized representative who has indicated his/her understanding and acceptance.     Dental advisory given  Plan Discussed with: CRNA, Anesthesiologist and Surgeon  Anesthesia Plan Comments:         Anesthesia Quick Evaluation

## 2021-01-27 NOTE — Anesthesia Postprocedure Evaluation (Signed)
Anesthesia Post Note  Patient: [facility]  Procedure(s) Performed: CARDIOVERSION     Patient location during evaluation: Endoscopy Anesthesia Type: General Level of consciousness: awake and alert Pain management: pain level controlled Vital Signs Assessment: post-procedure vital signs reviewed and stable Respiratory status: spontaneous breathing, nonlabored ventilation, respiratory function stable and patient connected to nasal cannula oxygen Cardiovascular status: blood pressure returned to baseline and stable Postop Assessment: no apparent nausea or vomiting Anesthetic complications: no   No notable events documented.  Last Vitals:  Vitals:   01/27/21 1459 01/27/21 1502  BP: (!) 185/88 (!) 168/85  Pulse: 71 71  Resp: 19 18  Temp:    SpO2: 96% 93%    Last Pain:  Vitals:   01/27/21 1459  TempSrc:   PainSc: 0-No pain                 Rewa Weissberg S

## 2021-01-27 NOTE — Transfer of Care (Signed)
Immediate Anesthesia Transfer of Care Note  Patient: Abigail Wallace  Procedure(s) Performed: CARDIOVERSION  Patient Location: PACU  Anesthesia Type:MAC  Level of Consciousness: drowsy  Airway & Oxygen Therapy: Patient Spontanous Breathing  Post-op Assessment: Report given to RN and Post -op Vital signs reviewed and stable  Post vital signs: Reviewed and stable  Last Vitals:  Vitals Value Taken Time  BP    Temp    Pulse    Resp    SpO2      Last Pain:  Vitals:   01/27/21 1348  TempSrc: Temporal  PainSc: 0-No pain         Complications: No notable events documented.

## 2021-01-30 ENCOUNTER — Encounter (HOSPITAL_COMMUNITY): Payer: Self-pay | Admitting: Cardiovascular Disease

## 2021-02-06 ENCOUNTER — Other Ambulatory Visit: Payer: Self-pay | Admitting: Cardiovascular Disease

## 2021-02-13 ENCOUNTER — Other Ambulatory Visit: Payer: Self-pay | Admitting: *Deleted

## 2021-02-13 ENCOUNTER — Encounter: Payer: Self-pay | Admitting: Family Medicine

## 2021-02-13 ENCOUNTER — Other Ambulatory Visit: Payer: Self-pay

## 2021-02-13 ENCOUNTER — Ambulatory Visit (INDEPENDENT_AMBULATORY_CARE_PROVIDER_SITE_OTHER): Payer: Medicare Other | Admitting: Family Medicine

## 2021-02-13 VITALS — BP 154/68 | HR 75 | Resp 18

## 2021-02-13 DIAGNOSIS — R300 Dysuria: Secondary | ICD-10-CM

## 2021-02-13 DIAGNOSIS — J441 Chronic obstructive pulmonary disease with (acute) exacerbation: Secondary | ICD-10-CM | POA: Diagnosis not present

## 2021-02-13 DIAGNOSIS — R3129 Other microscopic hematuria: Secondary | ICD-10-CM

## 2021-02-13 DIAGNOSIS — E1165 Type 2 diabetes mellitus with hyperglycemia: Secondary | ICD-10-CM

## 2021-02-13 LAB — POCT URINALYSIS DIP (MANUAL ENTRY)
Bilirubin, UA: NEGATIVE
Glucose, UA: 100 mg/dL — AB
Ketones, POC UA: NEGATIVE mg/dL
Leukocytes, UA: NEGATIVE
Nitrite, UA: NEGATIVE
Protein Ur, POC: >=300 mg/dL — AB
Spec Grav, UA: 1.01 (ref 1.010–1.025)
Urobilinogen, UA: 0.2 E.U./dL
pH, UA: 6 (ref 5.0–8.0)

## 2021-02-13 LAB — POCT UA - MICROSCOPIC ONLY

## 2021-02-13 MED ORDER — LINAGLIPTIN 5 MG PO TABS: 5.0000 mg | ORAL_TABLET | Freq: Every day | ORAL | 3 refills | Status: DC

## 2021-02-13 MED ORDER — DOXYCYCLINE HYCLATE 100 MG PO TABS: 100.0000 mg | ORAL_TABLET | Freq: Two times a day (BID) | ORAL | 0 refills | Status: DC

## 2021-02-13 MED ORDER — PREDNISONE 20 MG PO TABS: 40.0000 mg | ORAL_TABLET | Freq: Every day | ORAL | 0 refills | Status: AC

## 2021-02-13 NOTE — Progress Notes (Signed)
° °  SUBJECTIVE:   CHIEF COMPLAINT / HPI:    Abigail Wallace is a 76 y.o. female here for ongoing nasal congestion and cough for weeks.  History provided by son and patient.  Reports productive cough with increased sputum production for the past few weeks.  Has history of COPD and continues to smoke.  Smokes about quarter to half a pack per day.  Home COVID test was negative.  Pt reports dysuria for the past 2 to 3 weeks.  Son notes that she has no blood when he wipes her.  Denies vaginal bleeding or history of hysterectomy.  Patient concerned that she is having another UTI.   PERTINENT  PMH / PSH: reviewed and updated as appropriate   OBJECTIVE:   BP (!) 154/68    Pulse 75    Resp 18    SpO2 96%   GEN: Chronically ill-appearing elderly female, in no acute distress  CV: regular rate and rhythm RESP: no increased work of breathing, clear to ascultation bilaterally ABD: Bowel sounds present. Soft, non-tender, non-distended.  MSK: seated in wheelchair  SKIN: warm, dry  ASSESSMENT/PLAN:   COPD Patient is a 76 year old female with history of COPD who presents with persistent cough with increased sputum production for the past few weeks.  She continues to smoke 1/4 pk/day though this has decreased from 2 packs/day.  Recommended smoking sensation.  Treat for acute COPD exacerbation with doxycycline and prednisone for 5 days.  Son advised to call the office if blood sugars are elevated for additional instructions.     Dysuria  Urine not concerning for acute cystitis.  Obtain urine culture.  Hematuria  Moderate amount of blood with 1-5 RBCs seen on microscopy.  No reports of bleeding.  On chart review there is question of whether she had hysterectomy or not as it is noted in the chart that she had a vaginal hysterectomy in August 2012.  Son and patient are unaware of a history of hysterectomy. D&C/hysteroscopy that was performed in August 2012.  Given her smoking history do not want to missed  bladder cancer. Son would like to discuss with PCP Dr [name].  Follows with [facility].    Lyndee Hensen, DO PGY-3, [facility] 02/16/2021

## 2021-02-13 NOTE — Patient Instructions (Addendum)
Stop by the pharmacy to pick up your antibiotics and steroids.  If your blood sugar goes over 300, please call the office for instructions.  Follow-up with Dr. [name]

## 2021-02-13 NOTE — Telephone Encounter (Signed)
Sent to pharmacy in separate request.

## 2021-02-15 DIAGNOSIS — M1990 Unspecified osteoarthritis, unspecified site: Secondary | ICD-10-CM | POA: Diagnosis not present

## 2021-02-15 DIAGNOSIS — E119 Type 2 diabetes mellitus without complications: Secondary | ICD-10-CM | POA: Diagnosis not present

## 2021-02-15 LAB — URINE CULTURE

## 2021-02-16 ENCOUNTER — Encounter: Payer: Self-pay | Admitting: Family Medicine

## 2021-02-16 NOTE — Assessment & Plan Note (Signed)
Patient is a 76 year old female with history of COPD who presents with persistent cough with increased sputum production for the past few weeks.  She continues to smoke 1/4 pk/day though this has decreased from 2 packs/day.  Recommended smoking sensation.  Treat for acute COPD exacerbation with doxycycline and prednisone for 5 days.  Son advised to call the office if blood sugars are elevated for additional instructions.

## 2021-02-17 ENCOUNTER — Ambulatory Visit (INDEPENDENT_AMBULATORY_CARE_PROVIDER_SITE_OTHER): Payer: Commercial Managed Care - HMO

## 2021-02-17 ENCOUNTER — Telehealth: Payer: Self-pay

## 2021-02-17 DIAGNOSIS — I495 Sick sinus syndrome: Secondary | ICD-10-CM

## 2021-02-17 LAB — CUP PACEART REMOTE DEVICE CHECK
Battery Impedance: 772 Ohm
Battery Remaining Longevity: 63 mo
Battery Voltage: 2.78 V
Brady Statistic AP VP Percent: 80 %
Brady Statistic AP VS Percent: 0 %
Brady Statistic AS VP Percent: 20 %
Brady Statistic AS VS Percent: 0 %
Date Time Interrogation Session: 20230203070350
Implantable Lead Implant Date: 20070414
Implantable Lead Implant Date: 20070914
Implantable Lead Location: 753859
Implantable Lead Location: 753860
Implantable Lead Model: 4092
Implantable Lead Model: 5594
Implantable Pulse Generator Implant Date: 20160816
Lead Channel Impedance Value: 565 Ohm
Lead Channel Impedance Value: 594 Ohm
Lead Channel Pacing Threshold Amplitude: 0.75 V
Lead Channel Pacing Threshold Amplitude: 0.75 V
Lead Channel Pacing Threshold Pulse Width: 0.4 ms
Lead Channel Pacing Threshold Pulse Width: 0.4 ms
Lead Channel Setting Pacing Amplitude: 2 V
Lead Channel Setting Pacing Amplitude: 2.5 V
Lead Channel Setting Pacing Pulse Width: 0.4 ms
Lead Channel Setting Sensing Sensitivity: 4 mV

## 2021-02-17 NOTE — Telephone Encounter (Signed)
-----   Message from Sanda Klein, MD sent at 02/06/2021  5:19 PM EST ----- Please have her do an extra pacemaker download 3 weeks after the cardioversion (which will be roughly February 1 or so.  Then see me in June if she is still in normal rhythm.  If she is back in atrial fibrillation we will have to squeeze her in sooner. ----- Message ----- From: Ricci Barker, RN Sent: 02/06/2021   4:57 PM EST To: Sanda Klein, MD  When do you want a follow up with her? Cardioversion 1/13

## 2021-02-17 NOTE — Telephone Encounter (Signed)
Transmission received and reviewed. Presenting rhythm AP/VP 72. Routing to Scheduling/MC/ and [employer], Therapist, sports.

## 2021-02-21 ENCOUNTER — Encounter: Payer: Self-pay | Admitting: Family Medicine

## 2021-02-22 NOTE — Progress Notes (Signed)
Remote pacemaker transmission.   

## 2021-02-24 ENCOUNTER — Telehealth: Payer: Self-pay

## 2021-02-24 NOTE — Telephone Encounter (Signed)
Attempted to call son. If calls back - Monitor temperature and pulse ox once a day over weekend. Call on call line if >100.4 F. If oxygen <90% please have him call. If <88% should go to ED.  - Please have him send photo of hand (updated).  [name], MD  Family Medicine Teaching Service

## 2021-02-24 NOTE — Telephone Encounter (Signed)
Patient's son calls nurse line in regards to mother having swelling and low grade fever. (Tmax 100.4). Son does report that he has been sick this week. States that patient is not having any other sick symptoms at this time.   Recently finished antibiotic course and steroids.   Swelling started on Monday in hands. Reports that swelling is intermittent. Notices more when she places her hands down on the table. Denies pain.   Denies new swelling in feet and legs. No difficulty breathing, SHOB. Reports normal vital signs. Oxygen levels between 95-97% on room air.   Negative home COVID tests.   Mother does not want to come in for office visit. Son is requesting virtual visit. Advised of ED precautions over the weekend. Please advise of additional recommendations.   Talbot Grumbling, RN

## 2021-02-24 NOTE — Telephone Encounter (Signed)
Patient's son LVM on nurse line returning phone call to Dr. [name].   Please advise.   Talbot Grumbling, RN

## 2021-02-24 NOTE — Telephone Encounter (Signed)
Attempted to call patient. Reached voicemail, left generic voicemail to call back.   Marlo Goodrich, MD  Family Medicine Teaching Service   

## 2021-03-01 NOTE — Telephone Encounter (Signed)
Called patient's son to schedule office visit. Son is adamant that mother will not come into the office. Reports that last time she was seen here she "caught something."  I attempted to explain the importance of in person evaluation, however, son is still requesting to be evaluated virtually.   Forwarding to PCP.   Talbot Grumbling, RN

## 2021-03-01 NOTE — Telephone Encounter (Signed)
Called patient spoke with her.  She denies chest pain or dyspnea.  She reports her swelling is actually gotten better its mostly at the end of the day if she is had her legs down.  I then called her son.  He reports she rolled her ankle when she was try to get out of her wheelchair.  He has been trying to apply ice but she will let him.  This is improving.  He will try topical therapies and let me know how it progresses.  There is no calf swelling, calf tenderness chest pain or dyspnea he reports.  Reviewed return precautions.  All questions answered.

## 2021-03-10 ENCOUNTER — Telehealth: Payer: Self-pay

## 2021-03-10 NOTE — Telephone Encounter (Signed)
Called and spoke with patient--agree with assessment at [facility].   [name], MD  Family Medicine Teaching Service

## 2021-03-10 NOTE — Telephone Encounter (Signed)
Patient Abigail Wallace on nurse line requesting a call back to discuss foot coloration.   Spoke with patient. Patient reports her right foot has been "black and red" since Monday. Patient reports its very swollen and a "little" painful, however she does not remember any trauma to the area. Patient reports she has had a low grade fever a "few" times this week ~99.9 tmax. Patient denies chills or nausea vomiting.   Patient reports her son is going to take her to an urgent care tomorrow morning. Patient advised she should be seen as soon as possible and apt offered today. Patient reports her son is out today and can not bring her.   Red flags discussed with patient while she is home alone.   Will forward to PCP.

## 2021-03-11 ENCOUNTER — Other Ambulatory Visit: Payer: Self-pay

## 2021-03-11 ENCOUNTER — Emergency Department (HOSPITAL_BASED_OUTPATIENT_CLINIC_OR_DEPARTMENT_OTHER): Payer: Medicare Other

## 2021-03-11 ENCOUNTER — Emergency Department (HOSPITAL_BASED_OUTPATIENT_CLINIC_OR_DEPARTMENT_OTHER)
Admission: EM | Admit: 2021-03-11 | Discharge: 2021-03-11 | Disposition: A | Discharge status: Home / Self Care | Payer: Medicare Other | Attending: Student | Admitting: Student

## 2021-03-11 ENCOUNTER — Encounter (HOSPITAL_BASED_OUTPATIENT_CLINIC_OR_DEPARTMENT_OTHER): Payer: Self-pay | Admitting: Emergency Medicine

## 2021-03-11 DIAGNOSIS — X58XXXA Exposure to other specified factors, initial encounter: Secondary | ICD-10-CM | POA: Insufficient documentation

## 2021-03-11 DIAGNOSIS — I130 Hypertensive heart and chronic kidney disease with heart failure and stage 1 through stage 4 chronic kidney disease, or unspecified chronic kidney disease: Secondary | ICD-10-CM | POA: Insufficient documentation

## 2021-03-11 DIAGNOSIS — M7989 Other specified soft tissue disorders: Secondary | ICD-10-CM | POA: Diagnosis not present

## 2021-03-11 DIAGNOSIS — Z7984 Long term (current) use of oral hypoglycemic drugs: Secondary | ICD-10-CM | POA: Insufficient documentation

## 2021-03-11 DIAGNOSIS — T148XXA Other injury of unspecified body region, initial encounter: Secondary | ICD-10-CM

## 2021-03-11 DIAGNOSIS — Z79899 Other long term (current) drug therapy: Secondary | ICD-10-CM | POA: Diagnosis not present

## 2021-03-11 DIAGNOSIS — N1832 Chronic kidney disease, stage 3b: Secondary | ICD-10-CM | POA: Diagnosis not present

## 2021-03-11 DIAGNOSIS — I2510 Atherosclerotic heart disease of native coronary artery without angina pectoris: Secondary | ICD-10-CM | POA: Insufficient documentation

## 2021-03-11 DIAGNOSIS — Z91040 Latex allergy status: Secondary | ICD-10-CM | POA: Diagnosis not present

## 2021-03-11 DIAGNOSIS — S99921A Unspecified injury of right foot, initial encounter: Secondary | ICD-10-CM | POA: Diagnosis present

## 2021-03-11 DIAGNOSIS — L0390 Cellulitis, unspecified: Secondary | ICD-10-CM

## 2021-03-11 DIAGNOSIS — Z7901 Long term (current) use of anticoagulants: Secondary | ICD-10-CM | POA: Insufficient documentation

## 2021-03-11 DIAGNOSIS — Z950 Presence of cardiac pacemaker: Secondary | ICD-10-CM | POA: Insufficient documentation

## 2021-03-11 DIAGNOSIS — I480 Paroxysmal atrial fibrillation: Secondary | ICD-10-CM | POA: Diagnosis not present

## 2021-03-11 DIAGNOSIS — Z7951 Long term (current) use of inhaled steroids: Secondary | ICD-10-CM | POA: Diagnosis not present

## 2021-03-11 DIAGNOSIS — S9031XA Contusion of right foot, initial encounter: Secondary | ICD-10-CM | POA: Insufficient documentation

## 2021-03-11 DIAGNOSIS — F17210 Nicotine dependence, cigarettes, uncomplicated: Secondary | ICD-10-CM | POA: Diagnosis not present

## 2021-03-11 DIAGNOSIS — R7989 Other specified abnormal findings of blood chemistry: Secondary | ICD-10-CM | POA: Insufficient documentation

## 2021-03-11 DIAGNOSIS — D72829 Elevated white blood cell count, unspecified: Secondary | ICD-10-CM | POA: Insufficient documentation

## 2021-03-11 DIAGNOSIS — I5042 Chronic combined systolic (congestive) and diastolic (congestive) heart failure: Secondary | ICD-10-CM | POA: Insufficient documentation

## 2021-03-11 DIAGNOSIS — J449 Chronic obstructive pulmonary disease, unspecified: Secondary | ICD-10-CM | POA: Diagnosis not present

## 2021-03-11 DIAGNOSIS — L03115 Cellulitis of right lower limb: Secondary | ICD-10-CM | POA: Diagnosis not present

## 2021-03-11 LAB — CBC WITH DIFFERENTIAL/PLATELET
Abs Immature Granulocytes: 0.25 10*3/uL — ABNORMAL HIGH (ref 0.00–0.07)
Basophils Absolute: 0.1 10*3/uL (ref 0.0–0.1)
Basophils Relative: 1 %
Eosinophils Absolute: 0.4 10*3/uL (ref 0.0–0.5)
Eosinophils Relative: 3 %
HCT: 41.4 % (ref 36.0–46.0)
Hemoglobin: 13.3 g/dL (ref 12.0–15.0)
Immature Granulocytes: 2 %
Lymphocytes Relative: 26 %
Lymphs Abs: 3.4 10*3/uL (ref 0.7–4.0)
MCH: 28.3 pg (ref 26.0–34.0)
MCHC: 32.1 g/dL (ref 30.0–36.0)
MCV: 88.1 fL (ref 80.0–100.0)
Monocytes Absolute: 0.7 10*3/uL (ref 0.1–1.0)
Monocytes Relative: 5 %
Neutro Abs: 8.5 10*3/uL — ABNORMAL HIGH (ref 1.7–7.7)
Neutrophils Relative %: 63 %
Platelets: 324 10*3/uL (ref 150–400)
RBC: 4.7 MIL/uL (ref 3.87–5.11)
RDW: 15.4 % (ref 11.5–15.5)
WBC: 13.3 10*3/uL — ABNORMAL HIGH (ref 4.0–10.5)
nRBC: 0 % (ref 0.0–0.2)

## 2021-03-11 LAB — COMPREHENSIVE METABOLIC PANEL
ALT: 13 U/L (ref 0–44)
AST: 16 U/L (ref 15–41)
Albumin: 3.7 g/dL (ref 3.5–5.0)
Alkaline Phosphatase: 79 U/L (ref 38–126)
Anion gap: 7 (ref 5–15)
BUN: 24 mg/dL — ABNORMAL HIGH (ref 8–23)
CO2: 20 mmol/L — ABNORMAL LOW (ref 22–32)
Calcium: 9.3 mg/dL (ref 8.9–10.3)
Chloride: 106 mmol/L (ref 98–111)
Creatinine, Ser: 1.61 mg/dL — ABNORMAL HIGH (ref 0.44–1.00)
GFR, Estimated: 33 mL/min — ABNORMAL LOW (ref >60)
Glucose, Bld: 161 mg/dL — ABNORMAL HIGH (ref 70–99)
Potassium: 4.8 mmol/L (ref 3.5–5.1)
Sodium: 133 mmol/L — ABNORMAL LOW (ref 135–145)
Total Bilirubin: 0.5 mg/dL (ref 0.3–1.2)
Total Protein: 7 g/dL (ref 6.5–8.1)

## 2021-03-11 LAB — SEDIMENTATION RATE: Sed Rate: 26 mm/hr — ABNORMAL HIGH (ref 0–22)

## 2021-03-11 LAB — C-REACTIVE PROTEIN: CRP: 1.2 mg/dL — ABNORMAL HIGH (ref <1.0)

## 2021-03-11 MED ORDER — DOXYCYCLINE HYCLATE 100 MG PO TABS
100.0000 mg | ORAL_TABLET | Freq: Once | ORAL | Status: AC
Start: 2021-03-11 — End: 2021-03-11
  Administered 2021-03-11: 100 mg via ORAL
  Filled 2021-03-11: qty 1

## 2021-03-11 MED ORDER — DOXYCYCLINE HYCLATE 100 MG PO CAPS: 100.0000 mg | ORAL_CAPSULE | Freq: Two times a day (BID) | ORAL | 0 refills | Status: DC

## 2021-03-11 NOTE — ED Provider Notes (Signed)
[facility] Provider Note  CSN: [id number] Arrival date & time: 03/11/21 7829  Chief Complaint(s) Foot Pain  HPI Abigail Wallace is a 76 y.o. female with PMH T2DM with significant bilateral lower extremity neuropathy, paroxysmal A-fib on Eliquis, COPD, HTN who presents emergency department for evaluation of right foot discoloration.  She states that the foot has had varying degrees of discoloration over the last 1 week and was most purple 6 days ago and has been getting progressively better.  She spoke with her primary care physician who requested that she come to the emergency department for evaluation.  Patient has minimal feeling in her feet secondary to her diabetic neuropathy and does not remember any trauma to the foot.  Denies fever, chest pain, shortness of breath, abdominal pain, nausea, vomiting or other systemic symptoms.   Foot Pain   Past Medical History Past Medical History:  Diagnosis Date   Abdominal wall hernia 01/2017   Atrial fibrillation (HCC)    CHF (congestive heart failure) (HCC)    CKD (chronic kidney disease)    COPD (chronic obstructive pulmonary disease) (HCC)    Coronary artery disease    NON-CRITICAL   Ectasis aorta (HCC)    GERD (gastroesophageal reflux disease)    Gout    Hypertension    Memory difficulties 12/02/2013   Pacemaker    Psychosis ([place])    Tardive dyskinesia 12/02/2013   Type 2 diabetes mellitus [facility])    Patient Active Problem List   Diagnosis Date Noted   Chronic back pain 11/14/2018   Pulmonary nodules 03/03/2018   Paroxysmal atrial fibrillation ([place]) s/p cardioversion  01/28/2018   Dependence on wheelchair 05/17/2017   Abdominal aortic ectasia ([place]) 01/16/2017   Renal cyst 01/16/2017   Long-term use of high-risk medication 10/11/2016   CKD stage IIIb 09/12/2016   Tobacco use disorder 05/10/2014   Essential hypertension 04/22/2013   Well controlled type 2 diabetes mellitus ([place]) 03/20/2012    Gout 09/26/2011   Pacemaker 03/30/2010   Vaginal prolapse 03/27/2010   Sleep apnea 03/27/2010   Osteoarthritis of right knee 03/27/2010   Resting tremor 12/15/2009   Chronic combined systolic and diastolic heart failure ([place]) 09/30/2009   Sick sinus syndrome ([place]) 04/24/2006   Mixed hyperlipidemia 03/14/2006   Paranoid schizophrenia ([place]) 03/14/2006   COPD 03/14/2006   Home Medication(s) Prior to Admission medications   Medication Sig Start Date End Date Taking? Authorizing Provider  doxycycline (VIBRAMYCIN) 100 MG capsule Take 1 capsule (100 mg total) by mouth 2 (two) times daily. 03/11/21  Yes Shanon Becvar, MD  acetaminophen (TYLENOL) 500 MG tablet Take 1,000 mg by mouth every 6 (six) hours as needed for moderate pain.     [provider]  albuterol (PROVENTIL) (2.5 MG/3ML) 0.083% nebulizer solution Take 3 mLs (2.5 mg total) by nebulization every 6 (six) hours as needed for wheezing or shortness of breath. 06/30/20   Martyn Malay, MD  albuterol (VENTOLIN HFA) 108 (90 Base) MCG/ACT inhaler Inhale 2 puffs into the lungs every 6 (six) hours as needed for wheezing or shortness of breath. 01/03/21   Martyn Malay, MD  allopurinol (ZYLOPRIM) 100 MG tablet Take 1 tablet (100 mg total) by mouth daily. 07/02/20   Martyn Malay, MD  amLODipine (NORVASC) 5 MG tablet Take 1 tablet (5 mg total) by mouth daily. Patient taking differently: Take 10 mg by mouth daily. 10/10/20   O'Neal, Cassie Freer, MD  apixaban (ELIQUIS) 5 MG TABS tablet Take  1 tablet (5 mg total) by mouth 2 (two) times daily. 07/02/20 07/02/21  Martyn Malay, MD  Blood Glucose Monitoring Suppl [facility] VERIO) w/Device KIT Check blood sugar once daily 07/06/19   Martyn Malay, MD  cholecalciferol (VITAMIN D3) 25 MCG (1000 UNIT) tablet Take 1 tablet (1,000 Units total) by mouth daily. 07/02/20   Martyn Malay, MD  diclofenac Sodium (VOLTAREN) 1 % GEL Apply 2 g topically 4 (four) times daily as needed (pain). 03/11/20    Martyn Malay, MD  divalproex (DEPAKOTE ER) 250 MG 24 hr tablet Take 250 mg by mouth at bedtime.  02/11/18   [provider]  docusate sodium (COLACE) 100 MG capsule Take 1 capsule (100 mg total) by mouth 2 (two) times daily. Patient taking differently: Take 100 mg by mouth daily as needed for mild constipation. 08/09/20   Martyn Malay, MD  glucose blood [facility] VERIO) test strip Please use to check blood sugar once daily. E11.9 07/15/20   Martyn Malay, MD  glucose blood test strip Test each morning before breakfast Patient not taking: Reported on 12/27/2020 07/15/19   Martyn Malay, MD  hydrocortisone cream 0.5 % Apply 1 application topically 2 (two) times daily. Apply WITH Nystatin under left breast Patient taking differently: Apply 1 application topically 2 (two) times daily as needed for itching. Apply WITH Nystatin under left breast 11/28/20   Martyn Malay, MD  Incontinence Supplies MISC 1 Units by Does not apply route as needed. 12/13/15   McKeag, Marylynn Pearson, MD  Lancet Device MISC 1 Device by Does not apply route 3 (three) times a week. Patient not taking: Reported on 12/27/2020 07/15/19   Martyn Malay, MD  linagliptin (TRADJENTA) 5 MG TABS tablet Take 1 tablet (5 mg total) by mouth daily. 02/13/21   Martyn Malay, MD  metoprolol tartrate (LOPRESSOR) 50 MG tablet Take 2 tablets (100 mg total) by mouth 2 (two) times daily. Patient taking differently: Take 50-100 mg by mouth See admin instructions. Take 1 table 50 mg in the morning and 2 tablets  ( 100 mg) at bedtime 07/26/20   Martyn Malay, MD  nitroGLYCERIN (NITROSTAT) 0.4 MG SL tablet PLACE ONE TABLET UNDER THE TONGUE EVERY FIVE MINUTES FOR THREE DOSES AS NEEDED FOR CHEST PAIN. CALL 911 AFTER THAT Patient taking differently: 0.4 mg every 5 (five) minutes as needed for chest pain. 10/17/20   Croitoru, Mihai, MD  nystatin cream (MYCOSTATIN) Apply 1 application topically 2 (two) times daily. 11/28/20   Martyn Malay, MD   OLANZapine (ZYPREXA) 10 MG tablet Take 10 mg by mouth at bedtime. 06/08/14   [provider]  olmesartan (BENICAR) 5 MG tablet Take 5 mg by mouth daily.    [provider]  OneTouch Delica Lancets 85T MISC Use to test once daily.  DX code:E11.9 07/15/20   Martyn Malay, MD  pravastatin (PRAVACHOL) 40 MG tablet TAKE ONE TABLET BY MOUTH EVERY EVENING 02/07/21   Croitoru, Dani Gobble, MD  sodium bicarbonate 650 MG tablet Take 1 tablet (650 mg total) by mouth 2 (two) times daily. 07/02/20   Martyn Malay, MD  umeclidinium-vilanterol [facility] ELLIPTA) 62.5-25 MCG/ACT AEPB Inhale 1 puff into the lungs daily.    [provider]  Past Surgical History Past Surgical History:  Procedure Laterality Date   CARDIAC CATHETERIZATION  2007   Non-critical.    CARDIOVERSION N/A 01/27/2021   Procedure: CARDIOVERSION;  Surgeon: Geralynn Rile, MD;  Location: [facility];  Service: Cardiovascular;  Laterality: N/A;   CHOLECYSTECTOMY     ELBOW SURGERY     EP IMPLANTABLE DEVICE N/A 08/31/2014   Procedure: PPM Generator Changeout;  Surgeon: Sanda Klein, MD;  Location: [place] [facility];  Service: Cardiovascular;  Laterality: N/A;   INSERT / REPLACE / REMOVE PACEMAKER  2007   [place]/SYMPTOMATIC HEART BLOCK   PACEMAKER PLACEMENT  09/28/2005   2/2 SSS?   Persantine stress test  05/01/2010   EF 66%. Normal LV sys fx. Unchanged from previous studies.    TRANSTHORACIC ECHOCARDIOGRAM  09/08/10   SEVERE CONCENTRIC HYPERTROPHY.LV FUNCTION WAS VIGOROUS.EF 65%-70%.VENTRICULAR SEPTUM-INCOORDINATE MOTION.LEFT ATRIUM-MILDLY DILATED.TRIVIAL TR.   TUBAL LIGATION     Family History Family History  Problem Relation Age of Onset   Heart disease Mother    Heart disease Father    Stroke Sister    Diabetes type II Son    Colon cancer Neg Hx     Social  History Social History   Tobacco Use   Smoking status: Every Day    Packs/day: 0.50    Types: Cigarettes   Smokeless tobacco: Never   Tobacco comments:    6 cigs a day  Vaping Use   Vaping Use: Former  Substance Use Topics   Alcohol use: No    Alcohol/week: 0.0 standard drinks   Drug use: No   Allergies Abilify [aripiprazole], Clozapine, Benadryl [diphenhydramine hcl], Cogentin [benztropine], Haloperidol lactate, Latuda [lurasidone hcl], Lorazepam, Remeron [mirtazapine], Keflex [cephalexin], Codeine, and Latex  Review of Systems Review of Systems  Musculoskeletal:  Positive for joint swelling.  Skin:  Positive for color change.   Physical Exam Vital Signs  I have reviewed the triage vital signs BP (!) 141/75    Pulse 70    Temp 97.6 F (36.4 C) (Oral)    Resp 20    Ht '5\' 6"'  (1.676 m)    Wt 120.2 kg    SpO2 96%    BMI 42.77 kg/m   Physical Exam Vitals and nursing note reviewed.  Constitutional:      General: She is not in acute distress.    Appearance: She is well-developed.  HENT:     Head: Normocephalic and atraumatic.  Eyes:     Conjunctiva/sclera: Conjunctivae normal.  Cardiovascular:     Rate and Rhythm: Normal rate and regular rhythm.     Heart sounds: No murmur heard. Pulmonary:     Effort: Pulmonary effort is normal. No respiratory distress.     Breath sounds: Normal breath sounds.  Abdominal:     Palpations: Abdomen is soft.     Tenderness: There is no abdominal tenderness.  Musculoskeletal:        General: Swelling and tenderness present.     Cervical back: Neck supple.  Skin:    General: Skin is warm and dry.     Capillary Refill: Capillary refill takes less than 2 seconds.     Findings: Bruising (Multiple digits on right foot) present.  Neurological:     Mental Status: She is alert.  Psychiatric:        Mood and Affect: Mood normal.    ED Results and Treatments Labs (all labs ordered are listed, but only abnormal results are displayed) Labs  Reviewed  COMPREHENSIVE METABOLIC PANEL - Abnormal; Notable for the following components:      Result Value   Sodium 133 (*)    CO2 20 (*)    Glucose, Bld 161 (*)    BUN 24 (*)    Creatinine, Ser 1.61 (*)    GFR, Estimated 33 (*)    All other components within normal limits  CBC WITH DIFFERENTIAL/PLATELET - Abnormal; Notable for the following components:   WBC 13.3 (*)    Neutro Abs 8.5 (*)    Abs Immature Granulocytes 0.25 (*)    All other components within normal limits  SEDIMENTATION RATE  C-REACTIVE PROTEIN                                                                                                                          Radiology DG Ankle Complete Right  Result Date: 03/11/2021 CLINICAL DATA:  76 year old female with history of bruising and swelling in the right foot and ankle. EXAM: RIGHT FOOT COMPLETE - 3+ VIEW; RIGHT ANKLE - COMPLETE 3+ VIEW COMPARISON:  No priors. FINDINGS: Multiple views of the right foot and right ankle demonstrate no acute displaced fracture or dislocation. Mild osteopenia. There is multifocal joint space narrowing, subchondral sclerosis and osteophyte formation, most pronounced at the first MTP joint, indicative of osteoarthritis. Soft tissues are diffusely swollen. IMPRESSION: 1. Diffuse soft tissue swelling around the right foot and ankle. 2. No acute displaced fracture. 3. Degenerative changes of osteoarthritis, most severe at the first MTP joint. Electronically Signed   By: Vinnie Langton M.D.   On: 03/11/2021 07:45   DG Foot Complete Right  Result Date: 03/11/2021 CLINICAL DATA:  76 year old female with history of bruising and swelling in the right foot and ankle. EXAM: RIGHT FOOT COMPLETE - 3+ VIEW; RIGHT ANKLE - COMPLETE 3+ VIEW COMPARISON:  No priors. FINDINGS: Multiple views of the right foot and right ankle demonstrate no acute displaced fracture or dislocation. Mild osteopenia. There is multifocal joint space narrowing, subchondral sclerosis  and osteophyte formation, most pronounced at the first MTP joint, indicative of osteoarthritis. Soft tissues are diffusely swollen. IMPRESSION: 1. Diffuse soft tissue swelling around the right foot and ankle. 2. No acute displaced fracture. 3. Degenerative changes of osteoarthritis, most severe at the first MTP joint. Electronically Signed   By: Vinnie Langton M.D.   On: 03/11/2021 07:45    Pertinent labs & imaging results that were available during my care of the patient were reviewed by me and considered in my medical decision making (see MDM for details).  Medications Ordered in ED Medications  doxycycline (VIBRA-TABS) tablet 100 mg (has no administration in time range)  Procedures Procedures  (including critical care time)  Medical Decision Making / ED Course   This patient presents to the ED for concern of foot discoloration and swelling, this involves an extensive number of treatment options, and is a complaint that carries with it a high risk of complications and morbidity.  The differential diagnosis includes fracture, hematoma, cellulitis, diabetic foot infection, osteomyelitis, ischemic limb  MDM: Patient seen emergency [facility] for evaluation of foot discoloration and swelling.  Physical exam reveals right foot swelling up into the ankle with a deep purple discoloration on digits 2 through 4.  There is mild pain to palpation but the patient's underlying neuropathy makes this examination difficult.  Pulses confirmed with ultrasound.  X-ray with no fracture.  Laboratory evaluation with leukocytosis to 13.3, BUN 24, creatinine 1.61 which is the patient's baseline.  CRP pending.  Patient presentation almost certainly consistent with localized swelling and bruising worsened by the patient's Eliquis use as the discoloration appears to be improving over the  course of this week prior to evaluation.  However, there is some erythema that extends up into the dorsum of the foot and in the setting of her diabetes, we will cover with doxycycline and opt for close outpatient follow-up.  Patient states she will call her primary care physician for follow-up early next week and she has a podiatry follow-up on 03/15/2020.  Antibiotics can be discontinued per PCP evaluation if deemed necessary.  Low suspicion for underlying osteomyelitis in the setting of normal vital signs and normal x-ray, but if ESR CRP significantly elevated MRI could be considered in the outpatient setting.   Additional history obtained: -Additional history obtained from son -External records from outside source obtained and reviewed including: Chart review including previous notes, labs, imaging, consultation notes   Lab Tests: -I ordered, reviewed, and interpreted labs.   The pertinent results include:   Labs Reviewed  COMPREHENSIVE METABOLIC PANEL - Abnormal; Notable for the following components:      Result Value   Sodium 133 (*)    CO2 20 (*)    Glucose, Bld 161 (*)    BUN 24 (*)    Creatinine, Ser 1.61 (*)    GFR, Estimated 33 (*)    All other components within normal limits  CBC WITH DIFFERENTIAL/PLATELET - Abnormal; Notable for the following components:   WBC 13.3 (*)    Neutro Abs 8.5 (*)    Abs Immature Granulocytes 0.25 (*)    All other components within normal limits  SEDIMENTATION RATE  C-REACTIVE PROTEIN      Imaging Studies ordered: I ordered imaging studies including Foot and ankle XR I independently visualized and interpreted imaging. I agree with the radiologist interpretation   Medicines ordered and prescription drug management: Meds ordered this encounter  Medications   doxycycline (VIBRAMYCIN) 100 MG capsule    Sig: Take 1 capsule (100 mg total) by mouth 2 (two) times daily.    Dispense:  20 capsule    Refill:  0   doxycycline (VIBRA-TABS) tablet  100 mg    -I have reviewed the patients home medicines and have made adjustments as needed  Critical interventions none   Cardiac Monitoring: The patient was maintained on a cardiac monitor.  I personally viewed and interpreted the cardiac monitored which showed an underlying rhythm of: NSR  Social Determinants of Health:  Factors impacting patients care include: mobility difficulties    Reevaluation: After the interventions noted above, I reevaluated the patient and found that they  have :improved  Co morbidities that complicate the patient evaluation  Past Medical History:  Diagnosis Date   Abdominal wall hernia 01/2017   Atrial fibrillation (HCC)    CHF (congestive heart failure) (HCC)    CKD (chronic kidney disease)    COPD (chronic obstructive pulmonary disease) (HCC)    Coronary artery disease    NON-CRITICAL   Ectasis aorta (HCC)    GERD (gastroesophageal reflux disease)    Gout    Hypertension    Memory difficulties 12/02/2013   Pacemaker    Psychosis ([place])    Tardive dyskinesia 12/02/2013   Type 2 diabetes mellitus ([place])       Dispostion: I considered admission for this patient, but patient's presentation can be addressed with outpatient antibiotics and in the setting of stable vital signs, she does not meet admission criteria at this time     Final Clinical Impression(s) / ED Diagnoses Final diagnoses:  Cellulitis, unspecified cellulitis site  Hematoma     '@PCDICTATION' @    Teressa Lower, MD 03/11/21 [phone]

## 2021-03-11 NOTE — ED Triage Notes (Signed)
Pt is c/o right foot pain  Pt states she does not know what she did but her foot is bruised and swollen  Pt is diabetic   Pt also is c/o left hand pain and swelling

## 2021-03-11 NOTE — ED Notes (Signed)
Pt discharged to home. Discharge instructions have been discussed with patient and/or family members. Pt verbally acknowledges understanding d/c instructions, and endorses comprehension to checkout at registration before leaving.  °

## 2021-03-11 NOTE — ED Notes (Signed)
Pt to restroom in wheelchair, taken by son

## 2021-03-13 ENCOUNTER — Encounter: Payer: Self-pay | Admitting: Family Medicine

## 2021-03-13 ENCOUNTER — Telehealth: Payer: Self-pay

## 2021-03-13 DIAGNOSIS — K5904 Chronic idiopathic constipation: Secondary | ICD-10-CM

## 2021-03-13 MED ORDER — SENNA 8.6 MG PO TABS: 1.0000 | ORAL_TABLET | Freq: Every day | ORAL | 0 refills | Status: DC | PRN

## 2021-03-13 NOTE — Telephone Encounter (Signed)
Rx to pharmacy. Please let patient know.  Yianna [name], MD  Family Medicine Teaching Service   

## 2021-03-13 NOTE — Telephone Encounter (Signed)
Called patient and son. Discussed symptoms, improving. Also has mild cold (?allergies). Reviewed returned precautions.  [name], MD  Family Medicine Teaching Service

## 2021-03-13 NOTE — Telephone Encounter (Signed)
Patient LVM on nurse line requesting fa refill on Senokot. I do not see this on current medication list.   Patient also is requesting a call from PCP to discuss her foot.   I attempted to call patient back, however no answer.

## 2021-03-13 NOTE — Telephone Encounter (Signed)
Pt informed. Wants dr brown to call her about her foot. Chrishawn Kring Kennon Holter, CMA

## 2021-03-15 ENCOUNTER — Inpatient Hospital Stay (HOSPITAL_BASED_OUTPATIENT_CLINIC_OR_DEPARTMENT_OTHER)
Admission: EM | Admit: 2021-03-15 | Discharge: 2021-03-18 | DRG: 291 | Disposition: A | Discharge status: Home / Self Care | Payer: Medicare Other | Attending: Family Medicine | Admitting: Family Medicine

## 2021-03-15 ENCOUNTER — Emergency Department (HOSPITAL_BASED_OUTPATIENT_CLINIC_OR_DEPARTMENT_OTHER): Payer: Medicare Other

## 2021-03-15 ENCOUNTER — Telehealth: Payer: Self-pay | Admitting: Family Medicine

## 2021-03-15 ENCOUNTER — Other Ambulatory Visit: Payer: Self-pay

## 2021-03-15 ENCOUNTER — Encounter (HOSPITAL_BASED_OUTPATIENT_CLINIC_OR_DEPARTMENT_OTHER): Payer: Self-pay | Admitting: Emergency Medicine

## 2021-03-15 DIAGNOSIS — Z20822 Contact with and (suspected) exposure to covid-19: Secondary | ICD-10-CM | POA: Diagnosis present

## 2021-03-15 DIAGNOSIS — Z833 Family history of diabetes mellitus: Secondary | ICD-10-CM

## 2021-03-15 DIAGNOSIS — Z888 Allergy status to other drugs, medicaments and biological substances status: Secondary | ICD-10-CM

## 2021-03-15 DIAGNOSIS — M109 Gout, unspecified: Secondary | ICD-10-CM | POA: Diagnosis not present

## 2021-03-15 DIAGNOSIS — E1122 Type 2 diabetes mellitus with diabetic chronic kidney disease: Secondary | ICD-10-CM | POA: Diagnosis present

## 2021-03-15 DIAGNOSIS — I4891 Unspecified atrial fibrillation: Secondary | ICD-10-CM | POA: Diagnosis not present

## 2021-03-15 DIAGNOSIS — I2510 Atherosclerotic heart disease of native coronary artery without angina pectoris: Secondary | ICD-10-CM | POA: Diagnosis not present

## 2021-03-15 DIAGNOSIS — K5900 Constipation, unspecified: Secondary | ICD-10-CM | POA: Diagnosis present

## 2021-03-15 DIAGNOSIS — Z881 Allergy status to other antibiotic agents status: Secondary | ICD-10-CM | POA: Diagnosis not present

## 2021-03-15 DIAGNOSIS — J9601 Acute respiratory failure with hypoxia: Secondary | ICD-10-CM | POA: Diagnosis not present

## 2021-03-15 DIAGNOSIS — I5023 Acute on chronic systolic (congestive) heart failure: Secondary | ICD-10-CM | POA: Diagnosis present

## 2021-03-15 DIAGNOSIS — D72829 Elevated white blood cell count, unspecified: Secondary | ICD-10-CM | POA: Diagnosis present

## 2021-03-15 DIAGNOSIS — T380X5A Adverse effect of glucocorticoids and synthetic analogues, initial encounter: Secondary | ICD-10-CM | POA: Diagnosis not present

## 2021-03-15 DIAGNOSIS — F17210 Nicotine dependence, cigarettes, uncomplicated: Secondary | ICD-10-CM | POA: Diagnosis present

## 2021-03-15 DIAGNOSIS — Z950 Presence of cardiac pacemaker: Secondary | ICD-10-CM | POA: Diagnosis not present

## 2021-03-15 DIAGNOSIS — Z79899 Other long term (current) drug therapy: Secondary | ICD-10-CM

## 2021-03-15 DIAGNOSIS — Z91040 Latex allergy status: Secondary | ICD-10-CM | POA: Diagnosis not present

## 2021-03-15 DIAGNOSIS — R0989 Other specified symptoms and signs involving the circulatory and respiratory systems: Secondary | ICD-10-CM

## 2021-03-15 DIAGNOSIS — Z885 Allergy status to narcotic agent status: Secondary | ICD-10-CM | POA: Diagnosis not present

## 2021-03-15 DIAGNOSIS — R0902 Hypoxemia: Secondary | ICD-10-CM | POA: Diagnosis not present

## 2021-03-15 DIAGNOSIS — Z7901 Long term (current) use of anticoagulants: Secondary | ICD-10-CM

## 2021-03-15 DIAGNOSIS — R0602 Shortness of breath: Secondary | ICD-10-CM | POA: Diagnosis not present

## 2021-03-15 DIAGNOSIS — J441 Chronic obstructive pulmonary disease with (acute) exacerbation: Secondary | ICD-10-CM | POA: Diagnosis present

## 2021-03-15 DIAGNOSIS — R600 Localized edema: Secondary | ICD-10-CM | POA: Diagnosis not present

## 2021-03-15 DIAGNOSIS — L0390 Cellulitis, unspecified: Secondary | ICD-10-CM | POA: Diagnosis present

## 2021-03-15 DIAGNOSIS — Z8249 Family history of ischemic heart disease and other diseases of the circulatory system: Secondary | ICD-10-CM | POA: Diagnosis not present

## 2021-03-15 DIAGNOSIS — I442 Atrioventricular block, complete: Secondary | ICD-10-CM | POA: Diagnosis present

## 2021-03-15 DIAGNOSIS — Z7951 Long term (current) use of inhaled steroids: Secondary | ICD-10-CM

## 2021-03-15 DIAGNOSIS — N1832 Chronic kidney disease, stage 3b: Secondary | ICD-10-CM | POA: Diagnosis not present

## 2021-03-15 DIAGNOSIS — K219 Gastro-esophageal reflux disease without esophagitis: Secondary | ICD-10-CM | POA: Diagnosis present

## 2021-03-15 DIAGNOSIS — I509 Heart failure, unspecified: Secondary | ICD-10-CM

## 2021-03-15 DIAGNOSIS — J811 Chronic pulmonary edema: Secondary | ICD-10-CM | POA: Diagnosis not present

## 2021-03-15 DIAGNOSIS — I130 Hypertensive heart and chronic kidney disease with heart failure and stage 1 through stage 4 chronic kidney disease, or unspecified chronic kidney disease: Principal | ICD-10-CM | POA: Diagnosis present

## 2021-03-15 DIAGNOSIS — F419 Anxiety disorder, unspecified: Secondary | ICD-10-CM | POA: Diagnosis present

## 2021-03-15 DIAGNOSIS — Z7984 Long term (current) use of oral hypoglycemic drugs: Secondary | ICD-10-CM

## 2021-03-15 DIAGNOSIS — M1990 Unspecified osteoarthritis, unspecified site: Secondary | ICD-10-CM | POA: Diagnosis not present

## 2021-03-15 LAB — CBC WITH DIFFERENTIAL/PLATELET
Abs Immature Granulocytes: 0.23 10*3/uL — ABNORMAL HIGH (ref 0.00–0.07)
Basophils Absolute: 0.1 10*3/uL (ref 0.0–0.1)
Basophils Relative: 1 %
Eosinophils Absolute: 0.4 10*3/uL (ref 0.0–0.5)
Eosinophils Relative: 3 %
HCT: 39.5 % (ref 36.0–46.0)
Hemoglobin: 12.8 g/dL (ref 12.0–15.0)
Immature Granulocytes: 2 %
Lymphocytes Relative: 23 %
Lymphs Abs: 3.4 10*3/uL (ref 0.7–4.0)
MCH: 28.7 pg (ref 26.0–34.0)
MCHC: 32.4 g/dL (ref 30.0–36.0)
MCV: 88.6 fL (ref 80.0–100.0)
Monocytes Absolute: 0.9 10*3/uL (ref 0.1–1.0)
Monocytes Relative: 6 %
Neutro Abs: 9.4 10*3/uL — ABNORMAL HIGH (ref 1.7–7.7)
Neutrophils Relative %: 65 %
Platelets: 283 10*3/uL (ref 150–400)
RBC: 4.46 MIL/uL (ref 3.87–5.11)
RDW: 15.5 % (ref 11.5–15.5)
WBC: 14.4 10*3/uL — ABNORMAL HIGH (ref 4.0–10.5)
nRBC: 0 % (ref 0.0–0.2)

## 2021-03-15 LAB — I-STAT VENOUS BLOOD GAS, ED
Acid-base deficit: 5 mmol/L — ABNORMAL HIGH (ref 0.0–2.0)
Bicarbonate: 22.1 mmol/L (ref 20.0–28.0)
Calcium, Ion: 1.36 mmol/L (ref 1.15–1.40)
HCT: 38 % (ref 36.0–46.0)
Hemoglobin: 12.9 g/dL (ref 12.0–15.0)
O2 Saturation: 60 %
Patient temperature: 98.6
Potassium: 4.4 mmol/L (ref 3.5–5.1)
Sodium: 137 mmol/L (ref 135–145)
TCO2: 24 mmol/L (ref 22–32)
pCO2, Ven: 46.3 mmHg (ref 44–60)
pH, Ven: 7.287 (ref 7.25–7.43)
pO2, Ven: 35 mmHg (ref 32–45)

## 2021-03-15 LAB — VALPROIC ACID LEVEL: Valproic Acid Lvl: 11 ug/mL — ABNORMAL LOW (ref 50.0–100.0)

## 2021-03-15 LAB — COMPREHENSIVE METABOLIC PANEL
ALT: 11 U/L (ref 0–44)
AST: 17 U/L (ref 15–41)
Albumin: 3.5 g/dL (ref 3.5–5.0)
Alkaline Phosphatase: 81 U/L (ref 38–126)
Anion gap: 10 (ref 5–15)
BUN: 31 mg/dL — ABNORMAL HIGH (ref 8–23)
CO2: 20 mmol/L — ABNORMAL LOW (ref 22–32)
Calcium: 9.3 mg/dL (ref 8.9–10.3)
Chloride: 106 mmol/L (ref 98–111)
Creatinine, Ser: 1.65 mg/dL — ABNORMAL HIGH (ref 0.44–1.00)
GFR, Estimated: 32 mL/min — ABNORMAL LOW (ref >60)
Glucose, Bld: 197 mg/dL — ABNORMAL HIGH (ref 70–99)
Potassium: 4.3 mmol/L (ref 3.5–5.1)
Sodium: 136 mmol/L (ref 135–145)
Total Bilirubin: 0.6 mg/dL (ref 0.3–1.2)
Total Protein: 7 g/dL (ref 6.5–8.1)

## 2021-03-15 LAB — HEMOGLOBIN A1C
Hgb A1c MFr Bld: 6.1 % — ABNORMAL HIGH (ref 4.8–5.6)
Mean Plasma Glucose: 128.37 mg/dL

## 2021-03-15 LAB — TROPONIN I (HIGH SENSITIVITY): Troponin I (High Sensitivity): 12 ng/L (ref <18)

## 2021-03-15 LAB — GLUCOSE, CAPILLARY
Glucose-Capillary: 244 mg/dL — ABNORMAL HIGH (ref 70–99)
Glucose-Capillary: 197 mg/dL — ABNORMAL HIGH (ref 70–99)

## 2021-03-15 LAB — RESP PANEL BY RT-PCR (FLU A&B, COVID) ARPGX2
Influenza A by PCR: NEGATIVE
Influenza B by PCR: NEGATIVE
SARS Coronavirus 2 by RT PCR: NEGATIVE

## 2021-03-15 LAB — BRAIN NATRIURETIC PEPTIDE: B Natriuretic Peptide: 196 pg/mL — ABNORMAL HIGH (ref 0.0–100.0)

## 2021-03-15 MED ORDER — ALBUTEROL SULFATE (2.5 MG/3ML) 0.083% IN NEBU
5.0000 mg | INHALATION_SOLUTION | Freq: Once | RESPIRATORY_TRACT | Status: AC
  Administered 2021-03-15: 5 mg via RESPIRATORY_TRACT
  Filled 2021-03-15: qty 6

## 2021-03-15 MED ORDER — APIXABAN 5 MG PO TABS
5.0000 mg | ORAL_TABLET | Freq: Two times a day (BID) | ORAL | Status: DC
  Administered 2021-03-15 – 2021-03-18 (×7): 5 mg via ORAL
  Filled 2021-03-15 (×4): qty 1
  Filled 2021-03-15: qty 2
  Filled 2021-03-15 (×2): qty 1

## 2021-03-15 MED ORDER — SODIUM CHLORIDE 0.9% FLUSH: 3.0000 mL | INTRAVENOUS | Status: DC | PRN

## 2021-03-15 MED ORDER — INSULIN ASPART 100 UNIT/ML IJ SOLN
0.0000 [IU] | Freq: Three times a day (TID) | INTRAMUSCULAR | Status: DC
  Administered 2021-03-16: 3 [IU] via SUBCUTANEOUS
  Administered 2021-03-16 – 2021-03-17 (×3): 2 [IU] via SUBCUTANEOUS
  Administered 2021-03-17 – 2021-03-18 (×4): 3 [IU] via SUBCUTANEOUS

## 2021-03-15 MED ORDER — HYDRALAZINE HCL 25 MG PO TABS: 25.0000 mg | ORAL_TABLET | Freq: Four times a day (QID) | ORAL | Status: DC | PRN

## 2021-03-15 MED ORDER — METHYLPREDNISOLONE SODIUM SUCC 40 MG IJ SOLR
40.0000 mg | Freq: Two times a day (BID) | INTRAMUSCULAR | Status: AC
  Administered 2021-03-15 – 2021-03-17 (×5): 40 mg via INTRAVENOUS
  Filled 2021-03-15 (×5): qty 1

## 2021-03-15 MED ORDER — ONDANSETRON HCL 4 MG/2ML IJ SOLN: 4.0000 mg | Freq: Four times a day (QID) | INTRAMUSCULAR | Status: DC | PRN

## 2021-03-15 MED ORDER — FUROSEMIDE 10 MG/ML IJ SOLN
20.0000 mg | Freq: Once | INTRAMUSCULAR | Status: AC
Start: 2021-03-15 — End: 2021-03-15
  Administered 2021-03-15: 20 mg via INTRAVENOUS
  Filled 2021-03-15: qty 2

## 2021-03-15 MED ORDER — ACETAMINOPHEN 325 MG PO TABS
650.0000 mg | ORAL_TABLET | Freq: Four times a day (QID) | ORAL | Status: DC | PRN
  Administered 2021-03-15 – 2021-03-18 (×5): 650 mg via ORAL
  Filled 2021-03-15 (×5): qty 2

## 2021-03-15 MED ORDER — IPRATROPIUM BROMIDE 0.02 % IN SOLN
RESPIRATORY_TRACT | Status: AC
  Administered 2021-03-15: 0.5 mg via RESPIRATORY_TRACT
  Filled 2021-03-15: qty 2.5

## 2021-03-15 MED ORDER — ACETAMINOPHEN 500 MG PO TABS
1000.0000 mg | ORAL_TABLET | Freq: Once | ORAL | Status: AC
Start: 2021-03-15 — End: 2021-03-15
  Administered 2021-03-15: 1000 mg via ORAL
  Filled 2021-03-15: qty 2

## 2021-03-15 MED ORDER — OLANZAPINE 5 MG PO TABS
10.0000 mg | ORAL_TABLET | Freq: Every day | ORAL | Status: DC
  Administered 2021-03-15 – 2021-03-17 (×3): 10 mg via ORAL
  Filled 2021-03-15 (×3): qty 2

## 2021-03-15 MED ORDER — DOXYCYCLINE HYCLATE 100 MG PO TABS
100.0000 mg | ORAL_TABLET | Freq: Once | ORAL | Status: AC
  Administered 2021-03-15: 100 mg via ORAL
  Filled 2021-03-15: qty 1

## 2021-03-15 MED ORDER — IPRATROPIUM-ALBUTEROL 0.5-2.5 (3) MG/3ML IN SOLN
3.0000 mL | Freq: Four times a day (QID) | RESPIRATORY_TRACT | Status: DC
Start: 2021-03-15 — End: 2021-03-15

## 2021-03-15 MED ORDER — METHYLPREDNISOLONE SODIUM SUCC 125 MG IJ SOLR
125.0000 mg | Freq: Once | INTRAMUSCULAR | Status: AC
  Administered 2021-03-15: 125 mg via INTRAVENOUS
  Filled 2021-03-15: qty 2

## 2021-03-15 MED ORDER — VITAMIN D 25 MCG (1000 UNIT) PO TABS
1000.0000 [IU] | ORAL_TABLET | Freq: Every day | ORAL | Status: DC
  Administered 2021-03-15 – 2021-03-18 (×4): 1000 [IU] via ORAL
  Filled 2021-03-15 (×4): qty 1

## 2021-03-15 MED ORDER — IPRATROPIUM BROMIDE 0.02 % IN SOLN: 0.5000 mg | Freq: Four times a day (QID) | RESPIRATORY_TRACT | Status: DC

## 2021-03-15 MED ORDER — IRBESARTAN 75 MG PO TABS
37.5000 mg | ORAL_TABLET | Freq: Every day | ORAL | Status: DC
Start: 2021-03-15 — End: 2021-03-18
  Administered 2021-03-15 – 2021-03-18 (×4): 37.5 mg via ORAL
  Filled 2021-03-15 (×4): qty 1

## 2021-03-15 MED ORDER — SODIUM BICARBONATE 650 MG PO TABS
650.0000 mg | ORAL_TABLET | Freq: Two times a day (BID) | ORAL | Status: AC
  Administered 2021-03-15 – 2021-03-16 (×2): 650 mg via ORAL
  Filled 2021-03-15 (×2): qty 1

## 2021-03-15 MED ORDER — ALBUTEROL SULFATE (2.5 MG/3ML) 0.083% IN NEBU
2.5000 mg | INHALATION_SOLUTION | Freq: Four times a day (QID) | RESPIRATORY_TRACT | Status: DC
  Administered 2021-03-15: 2.5 mg via RESPIRATORY_TRACT
  Filled 2021-03-15: qty 3

## 2021-03-15 MED ORDER — IPRATROPIUM BROMIDE 0.02 % IN SOLN
0.5000 mg | Freq: Once | RESPIRATORY_TRACT | Status: AC
  Filled 2021-03-15: qty 2.5

## 2021-03-15 MED ORDER — ONDANSETRON HCL 4 MG PO TABS: 4.0000 mg | ORAL_TABLET | Freq: Four times a day (QID) | ORAL | Status: DC | PRN

## 2021-03-15 MED ORDER — AMLODIPINE BESYLATE 10 MG PO TABS
10.0000 mg | ORAL_TABLET | Freq: Every day | ORAL | Status: DC
Start: 2021-03-15 — End: 2021-03-16
  Administered 2021-03-15: 10 mg via ORAL
  Filled 2021-03-15 (×2): qty 1

## 2021-03-15 MED ORDER — LINAGLIPTIN 5 MG PO TABS: 5.0000 mg | ORAL_TABLET | Freq: Every day | ORAL | Status: DC

## 2021-03-15 MED ORDER — ALBUTEROL SULFATE (2.5 MG/3ML) 0.083% IN NEBU
2.5000 mg | INHALATION_SOLUTION | RESPIRATORY_TRACT | Status: DC | PRN
  Administered 2021-03-16 (×2): 2.5 mg via RESPIRATORY_TRACT
  Filled 2021-03-15 (×2): qty 3

## 2021-03-15 MED ORDER — POLYETHYLENE GLYCOL 3350 17 G PO PACK
17.0000 g | PACK | Freq: Two times a day (BID) | ORAL | Status: DC
  Administered 2021-03-15 – 2021-03-18 (×6): 17 g via ORAL
  Filled 2021-03-15 (×6): qty 1

## 2021-03-15 MED ORDER — LINAGLIPTIN 5 MG PO TABS
5.0000 mg | ORAL_TABLET | Freq: Every day | ORAL | Status: DC
  Administered 2021-03-15 – 2021-03-18 (×4): 5 mg via ORAL
  Filled 2021-03-15 (×4): qty 1

## 2021-03-15 MED ORDER — SODIUM CHLORIDE 0.9% FLUSH
3.0000 mL | Freq: Two times a day (BID) | INTRAVENOUS | Status: DC
  Administered 2021-03-15 – 2021-03-18 (×6): 3 mL via INTRAVENOUS

## 2021-03-15 MED ORDER — ALLOPURINOL 100 MG PO TABS
100.0000 mg | ORAL_TABLET | Freq: Every day | ORAL | Status: DC
  Administered 2021-03-15 – 2021-03-18 (×4): 100 mg via ORAL
  Filled 2021-03-15 (×4): qty 1

## 2021-03-15 MED ORDER — ALBUTEROL SULFATE (2.5 MG/3ML) 0.083% IN NEBU: 2.5000 mg | INHALATION_SOLUTION | Freq: Four times a day (QID) | RESPIRATORY_TRACT | Status: DC

## 2021-03-15 MED ORDER — IPRATROPIUM-ALBUTEROL 0.5-2.5 (3) MG/3ML IN SOLN
3.0000 mL | RESPIRATORY_TRACT | Status: DC | PRN
  Administered 2021-03-15: 3 mL via RESPIRATORY_TRACT
  Filled 2021-03-15: qty 3

## 2021-03-15 MED ORDER — DOXYCYCLINE HYCLATE 100 MG PO TABS
100.0000 mg | ORAL_TABLET | Freq: Two times a day (BID) | ORAL | Status: DC
  Administered 2021-03-15 – 2021-03-18 (×6): 100 mg via ORAL
  Filled 2021-03-15 (×6): qty 1

## 2021-03-15 MED ORDER — METOPROLOL TARTRATE 50 MG PO TABS
100.0000 mg | ORAL_TABLET | Freq: Two times a day (BID) | ORAL | Status: DC
  Administered 2021-03-15 – 2021-03-18 (×7): 100 mg via ORAL
  Filled 2021-03-15 (×2): qty 2
  Filled 2021-03-15: qty 4
  Filled 2021-03-15 (×4): qty 2

## 2021-03-15 MED ORDER — ACETAMINOPHEN 500 MG PO TABS
1000.0000 mg | ORAL_TABLET | Freq: Once | ORAL | Status: AC
  Administered 2021-03-15: 1000 mg via ORAL
  Filled 2021-03-15: qty 2

## 2021-03-15 MED ORDER — UMECLIDINIUM-VILANTEROL 62.5-25 MCG/ACT IN AEPB: 1.0000 | INHALATION_SPRAY | Freq: Every day | RESPIRATORY_TRACT | Status: DC

## 2021-03-15 MED ORDER — SODIUM CHLORIDE 0.9 % IV SOLN: 250.0000 mL | INTRAVENOUS | Status: DC | PRN

## 2021-03-15 MED ORDER — DIVALPROEX SODIUM ER 250 MG PO TB24
250.0000 mg | ORAL_TABLET | Freq: Every day | ORAL | Status: DC
  Administered 2021-03-15 – 2021-03-17 (×3): 250 mg via ORAL
  Filled 2021-03-15 (×4): qty 1

## 2021-03-15 NOTE — ED Provider Notes (Signed)
[facility] Provider Note   CSN: [id number] Arrival date & time: 03/15/21  0447     History  Chief Complaint  Patient presents with   Shortness of Breath    Abigail Wallace is a 76 y.o. female.  The history is provided by the patient, a relative and medical records.  Shortness of Breath Abigail Wallace is a 76 y.o. female who presents to the Emergency Department complaining of sob and congestion.  She presents to the ED accompanied by her son for evaluation of sob and congestion that have been present for the last three days.  Has associated wheezing.  He check her home pulse ox and it at times has been in the mid 80s.  Temp to 99 at home.  Has associated nausea.  No chest pain, abdominal pain, diarrhea.  Recently seen for lower extremity swelling and dx with cellulitis, started on doxy.  Overall lower extremity swelling is improving.  Son recently sick with URI sxs.  Three weeks ago pt was treated with prednisone burst for COPD exacerbation.     She uses inhalers and nebulizers at home.  She is not on supplemental oxygen.  She does take Eliquis for history of A-fib.  She has a pacemaker. Home Medications Prior to Admission medications   Medication Sig Start Date End Date Taking? Authorizing Provider  acetaminophen (TYLENOL) 500 MG tablet Take 1,000 mg by mouth every 6 (six) hours as needed for moderate pain.     [provider]  albuterol (PROVENTIL) (2.5 MG/3ML) 0.083% nebulizer solution Take 3 mLs (2.5 mg total) by nebulization every 6 (six) hours as needed for wheezing or shortness of breath. 06/30/20   Martyn Malay, MD  albuterol (VENTOLIN HFA) 108 (90 Base) MCG/ACT inhaler Inhale 2 puffs into the lungs every 6 (six) hours as needed for wheezing or shortness of breath. 01/03/21   Martyn Malay, MD  allopurinol (ZYLOPRIM) 100 MG tablet Take 1 tablet (100 mg total) by mouth daily. 07/02/20   Martyn Malay, MD  amLODipine (NORVASC) 5 MG  tablet Take 1 tablet (5 mg total) by mouth daily. Patient taking differently: Take 10 mg by mouth daily. 10/10/20   O'Neal, Cassie Freer, MD  apixaban (ELIQUIS) 5 MG TABS tablet Take 1 tablet (5 mg total) by mouth 2 (two) times daily. 07/02/20 07/02/21  Martyn Malay, MD  Blood Glucose Monitoring Suppl [facility] VERIO) w/Device KIT Check blood sugar once daily 07/06/19   Martyn Malay, MD  cholecalciferol (VITAMIN D3) 25 MCG (1000 UNIT) tablet Take 1 tablet (1,000 Units total) by mouth daily. 07/02/20   Martyn Malay, MD  diclofenac Sodium (VOLTAREN) 1 % GEL Apply 2 g topically 4 (four) times daily as needed (pain). 03/11/20   Martyn Malay, MD  divalproex (DEPAKOTE ER) 250 MG 24 hr tablet Take 250 mg by mouth at bedtime.  02/11/18   [provider]  docusate sodium (COLACE) 100 MG capsule Take 1 capsule (100 mg total) by mouth 2 (two) times daily. Patient taking differently: Take 100 mg by mouth daily as needed for mild constipation. 08/09/20   Martyn Malay, MD  doxycycline (VIBRAMYCIN) 100 MG capsule Take 1 capsule (100 mg total) by mouth 2 (two) times daily. 03/11/21   Kommor, Madison, MD  glucose blood (ONETOUCH VERIO) test strip Please use to check blood sugar once daily. E11.9 07/15/20   Martyn Malay, MD  glucose blood test strip Test each morning  before breakfast Patient not taking: Reported on 12/27/2020 07/15/19   Martyn Malay, MD  hydrocortisone cream 0.5 % Apply 1 application topically 2 (two) times daily. Apply WITH Nystatin under left breast Patient taking differently: Apply 1 application topically 2 (two) times daily as needed for itching. Apply WITH Nystatin under left breast 11/28/20   Martyn Malay, MD  Incontinence Supplies MISC 1 Units by Does not apply route as needed. 12/13/15   McKeag, Marylynn Pearson, MD  Lancet Device MISC 1 Device by Does not apply route 3 (three) times a week. Patient not taking: Reported on 12/27/2020 07/15/19   Martyn Malay, MD  linagliptin  (TRADJENTA) 5 MG TABS tablet Take 1 tablet (5 mg total) by mouth daily. 02/13/21   Martyn Malay, MD  metoprolol tartrate (LOPRESSOR) 50 MG tablet Take 2 tablets (100 mg total) by mouth 2 (two) times daily. Patient taking differently: Take 50-100 mg by mouth See admin instructions. Take 1 table 50 mg in the morning and 2 tablets  ( 100 mg) at bedtime 07/26/20   Martyn Malay, MD  nitroGLYCERIN (NITROSTAT) 0.4 MG SL tablet PLACE ONE TABLET UNDER THE TONGUE EVERY FIVE MINUTES FOR THREE DOSES AS NEEDED FOR CHEST PAIN. CALL 911 AFTER THAT Patient taking differently: 0.4 mg every 5 (five) minutes as needed for chest pain. 10/17/20   Croitoru, Mihai, MD  nystatin cream (MYCOSTATIN) Apply 1 application topically 2 (two) times daily. 11/28/20   Martyn Malay, MD  OLANZapine (ZYPREXA) 10 MG tablet Take 10 mg by mouth at bedtime. 06/08/14   [provider]  olmesartan (BENICAR) 5 MG tablet Take 5 mg by mouth daily.    [provider]  OneTouch Delica Lancets 24M MISC Use to test once daily.  DX code:E11.9 07/15/20   Martyn Malay, MD  pravastatin (PRAVACHOL) 40 MG tablet TAKE ONE TABLET BY MOUTH EVERY EVENING 02/07/21   Croitoru, Dani Gobble, MD  senna (SENOKOT) 8.6 MG TABS tablet Take 1 tablet (8.6 mg total) by mouth daily as needed for mild constipation. 03/13/21   Martyn Malay, MD  sodium bicarbonate 650 MG tablet Take 1 tablet (650 mg total) by mouth 2 (two) times daily. 07/02/20   Martyn Malay, MD  umeclidinium-vilanterol [facility] ELLIPTA) 62.5-25 MCG/ACT AEPB Inhale 1 puff into the lungs daily.    [provider]      Allergies    Abilify [aripiprazole], Clozapine, Benadryl [diphenhydramine hcl], Cogentin [benztropine], Haloperidol lactate, Latuda [lurasidone hcl], Lorazepam, Remeron [mirtazapine], Keflex [cephalexin], Codeine, and Latex    Review of Systems   Review of Systems  Respiratory:  Positive for shortness of breath.   All other systems reviewed and are  negative.  Physical Exam Updated Vital Signs BP (!) 158/72    Pulse 65    Temp 98.6 F (37 C) (Oral)    Resp 10    Wt 123 kg    SpO2 95%    BMI 43.77 kg/m  Physical Exam Vitals and nursing note reviewed.  Constitutional:      Appearance: She is well-developed.  HENT:     Head: Normocephalic and atraumatic.  Cardiovascular:     Rate and Rhythm: Normal rate and regular rhythm.     Heart sounds: No murmur heard. Pulmonary:     Comments: Tachypnea.  Inspiratory and expiratory wheezes bilaterally Abdominal:     Palpations: Abdomen is soft.     Tenderness: There is no abdominal tenderness. There is no guarding or rebound.  Musculoskeletal:     Comments: 2+ pitting edema to the right lower extremity, 1+ pitting edema to the left lower extremity.  Skin:    General: Skin is warm and dry.  Neurological:     Mental Status: She is alert and oriented to person, place, and time.  Psychiatric:        Behavior: Behavior normal.    ED Results / Procedures / Treatments   Labs (all labs ordered are listed, but only abnormal results are displayed) Labs Reviewed  COMPREHENSIVE METABOLIC PANEL - Abnormal; Notable for the following components:      Result Value   CO2 20 (*)    Glucose, Bld 197 (*)    BUN 31 (*)    Creatinine, Ser 1.65 (*)    GFR, Estimated 32 (*)    All other components within normal limits  CBC WITH DIFFERENTIAL/PLATELET - Abnormal; Notable for the following components:   WBC 14.4 (*)    Neutro Abs 9.4 (*)    Abs Immature Granulocytes 0.23 (*)    All other components within normal limits  BRAIN NATRIURETIC PEPTIDE - Abnormal; Notable for the following components:   B Natriuretic Peptide 196.0 (*)    All other components within normal limits  I-STAT VENOUS BLOOD GAS, ED - Abnormal; Notable for the following components:   Acid-base deficit 5.0 (*)    All other components within normal limits  RESP PANEL BY RT-PCR (FLU A&B, COVID) ARPGX2  TROPONIN I (HIGH SENSITIVITY)     EKG None  Radiology No results found.  Procedures Procedures    Medications Ordered in ED Medications  methylPREDNISolone sodium succinate (SOLU-MEDROL) 125 mg/2 mL injection 125 mg (has no administration in time range)  albuterol (PROVENTIL) (2.5 MG/3ML) 0.083% nebulizer solution 5 mg (5 mg Nebulization Given 03/15/21 0537)  ipratropium (ATROVENT) nebulizer solution 0.5 mg (0.5 mg Nebulization Given 03/15/21 0536)    ED Course/ Medical Decision Making/ A&P                           Medical Decision Making Amount and/or Complexity of Data Reviewed Labs: ordered. Radiology: ordered. ECG/medicine tests: ordered.  Risk OTC drugs. Prescription drug management. Decision regarding hospitalization.   Pt with hx/o COPD, CHF, afib, ckd here for evaluation of SOB.  Pt with wheezing on initial exam, tachypnea.  She was hypoxic on ED presentation.  She was treated with steroids, neb with partial improvement in sxs but has persistent wheezing.  CXR c/w CHF - will treat with lasix for diuresis.  Pt care transferred pending vas [place] to r/o DVT, re-assessment after treatment.         Final Clinical Impression(s) / ED Diagnoses Final diagnoses:  None    Rx / DC Orders ED Discharge Orders     None         Quintella Reichert, MD 03/15/21 2254

## 2021-03-15 NOTE — Telephone Encounter (Signed)
Family Medicine PCP Note ?Patient noted to be in hospital at [facility]. Received mychart from son about declining insulin. Called, discussed. Patient amenable to insulin and additional therapies.  ? ?Very appreciative of [facility] care of this patient while she is admitted.  ? ?Will ensure outpatient follow up arranged. ? ?[name], MD  ?Family Medicine ?

## 2021-03-15 NOTE — ED Notes (Signed)
Turned on 2L [place] per pt comfort. Pt respiration rate 20 and SpO2 93% prior to turning on O2. ?

## 2021-03-15 NOTE — ED Notes (Signed)
ED Provider at bedside. 

## 2021-03-15 NOTE — ED Notes (Signed)
Initial O2 sat of 85%  Oxygen applied at 4 liters/min via [place]  Sats up to 96% ?

## 2021-03-15 NOTE — H&P (Signed)
TRH H&P    Patient Demographics:    Abigail Wallace, is a 76 y.o. female  MRN: [id number]  DOB - 1945/12/13  Admit Date - 03/15/2021  Referring MD/NP/PA:   Outpatient Primary MD for the patient is Martyn Malay, MD  Patient coming from: Dr. Vallery Ridge  Chief complaint-shortness of breath   HPI:    Abigail Wallace  is a 76 y.o. female,With history of CKD stage IIIb, hypertension, atrial fibrillation, on chronic anticoagulation with Eliquis, permanent pacemaker for complete heart block, diabetes mellitus type 2 presented to ED for worsening shortness of breath.  Patient said that she has been getting short of breath since Sunday. Denies any chest pain.  In the ED she was found to be hypoxemic, she was started on 2 L of oxygen via nasal cannula.  Chest x-ray showed possible CHF.  Patient was given 1 dose of Lasix 20 mg IV.  She also received 1 dose of Solu-Medrol 125 mg IV. she also has been taking doxycycline which was prescribed as outpatient. She denies nausea vomiting or diarrhea Complains of constipation Denies fever or chills. Denies abdominal pain or dysuria     Review of systems:    In addition to the HPI above,   All other systems reviewed and are negative.    Past History of the following :    Past Medical History:  Diagnosis Date   Abdominal wall hernia 01/2017   Atrial fibrillation (HCC)    CHF (congestive heart failure) (HCC)    CKD (chronic kidney disease)    COPD (chronic obstructive pulmonary disease) (HCC)    Coronary artery disease    NON-CRITICAL   Ectasis aorta (HCC)    GERD (gastroesophageal reflux disease)    Gout    Hypertension    Memory difficulties 12/02/2013   Pacemaker    Psychosis ([place])    Tardive dyskinesia 12/02/2013   Type 2 diabetes mellitus ([place])       Past Surgical History:  Procedure Laterality Date   CARDIAC CATHETERIZATION  2007   Non-critical.     CARDIOVERSION N/A 01/27/2021   Procedure: CARDIOVERSION;  Surgeon: Geralynn Rile, MD;  Location: [place];  Service: Cardiovascular;  Laterality: N/A;   CHOLECYSTECTOMY     ELBOW SURGERY     EP IMPLANTABLE DEVICE N/A 08/31/2014   Procedure: PPM Generator Changeout;  Surgeon: Sanda Klein, MD;  Location: [place] [facility];  Service: Cardiovascular;  Laterality: N/A;   INSERT / REPLACE / REMOVE PACEMAKER  2007   [place]/SYMPTOMATIC HEART BLOCK   PACEMAKER PLACEMENT  09/28/2005   2/2 SSS?   Persantine stress test  05/01/2010   EF 66%. Normal LV sys fx. Unchanged from previous studies.    TRANSTHORACIC ECHOCARDIOGRAM  09/08/10   SEVERE CONCENTRIC HYPERTROPHY.LV FUNCTION WAS VIGOROUS.EF 65%-70%.VENTRICULAR SEPTUM-INCOORDINATE MOTION.LEFT ATRIUM-MILDLY DILATED.TRIVIAL TR.   TUBAL LIGATION        Social History:      Social History   Tobacco Use   Smoking status: Every Day    Packs/day: 0.50  Types: Cigarettes   Smokeless tobacco: Never   Tobacco comments:    6 cigs a day  Substance Use Topics   Alcohol use: No    Alcohol/week: 0.0 standard drinks       Family History :     Family History  Problem Relation Age of Onset   Heart disease Mother    Heart disease Father    Stroke Sister    Diabetes type II Son    Colon cancer Neg Hx       Home Medications:   Prior to Admission medications   Medication Sig Start Date End Date Taking? Authorizing Provider  acetaminophen (TYLENOL) 500 MG tablet Take 1,000 mg by mouth every 6 (six) hours as needed for moderate pain.     [provider]  albuterol (PROVENTIL) (2.5 MG/3ML) 0.083% nebulizer solution Take 3 mLs (2.5 mg total) by nebulization every 6 (six) hours as needed for wheezing or shortness of breath. 06/30/20   Martyn Malay, MD  albuterol (VENTOLIN HFA) 108 (90 Base) MCG/ACT inhaler Inhale 2 puffs into the lungs every 6 (six) hours as needed for wheezing or shortness of breath. 01/03/21   Martyn Malay, MD  allopurinol (ZYLOPRIM) 100 MG tablet Take 1 tablet (100 mg total) by mouth daily. 07/02/20   Martyn Malay, MD  amLODipine (NORVASC) 5 MG tablet Take 1 tablet (5 mg total) by mouth daily. Patient taking differently: Take 10 mg by mouth daily. 10/10/20   O'Neal, Cassie Freer, MD  apixaban (ELIQUIS) 5 MG TABS tablet Take 1 tablet (5 mg total) by mouth 2 (two) times daily. 07/02/20 07/02/21  Martyn Malay, MD  Blood Glucose Monitoring Suppl [facility] VERIO) w/Device KIT Check blood sugar once daily 07/06/19   Martyn Malay, MD  cholecalciferol (VITAMIN D3) 25 MCG (1000 UNIT) tablet Take 1 tablet (1,000 Units total) by mouth daily. 07/02/20   Martyn Malay, MD  diclofenac Sodium (VOLTAREN) 1 % GEL Apply 2 g topically 4 (four) times daily as needed (pain). 03/11/20   Martyn Malay, MD  divalproex (DEPAKOTE ER) 250 MG 24 hr tablet Take 250 mg by mouth at bedtime.  02/11/18   [provider]  docusate sodium (COLACE) 100 MG capsule Take 1 capsule (100 mg total) by mouth 2 (two) times daily. Patient taking differently: Take 100 mg by mouth daily as needed for mild constipation. 08/09/20   Martyn Malay, MD  doxycycline (VIBRAMYCIN) 100 MG capsule Take 1 capsule (100 mg total) by mouth 2 (two) times daily. 03/11/21   Kommor, Madison, MD  glucose blood (ONETOUCH VERIO) test strip Please use to check blood sugar once daily. E11.9 07/15/20   Martyn Malay, MD  glucose blood test strip Test each morning before breakfast Patient not taking: Reported on 12/27/2020 07/15/19   Martyn Malay, MD  hydrocortisone cream 0.5 % Apply 1 application topically 2 (two) times daily. Apply WITH Nystatin under left breast Patient taking differently: Apply 1 application topically 2 (two) times daily as needed for itching. Apply WITH Nystatin under left breast 11/28/20   Martyn Malay, MD  Incontinence Supplies MISC 1 Units by Does not apply route as needed. 12/13/15   McKeag, Marylynn Pearson, MD  Lancet Device  MISC 1 Device by Does not apply route 3 (three) times a week. Patient not taking: Reported on 12/27/2020 07/15/19   Martyn Malay, MD  linagliptin (TRADJENTA) 5 MG TABS tablet Take 1 tablet (5 mg total)  by mouth daily. 02/13/21   Martyn Malay, MD  metoprolol tartrate (LOPRESSOR) 50 MG tablet Take 2 tablets (100 mg total) by mouth 2 (two) times daily. Patient taking differently: Take 50-100 mg by mouth See admin instructions. Take 1 table 50 mg in the morning and 2 tablets  ( 100 mg) at bedtime 07/26/20   Martyn Malay, MD  nitroGLYCERIN (NITROSTAT) 0.4 MG SL tablet PLACE ONE TABLET UNDER THE TONGUE EVERY FIVE MINUTES FOR THREE DOSES AS NEEDED FOR CHEST PAIN. CALL 911 AFTER THAT Patient taking differently: 0.4 mg every 5 (five) minutes as needed for chest pain. 10/17/20   Croitoru, Mihai, MD  nystatin cream (MYCOSTATIN) Apply 1 application topically 2 (two) times daily. 11/28/20   Martyn Malay, MD  OLANZapine (ZYPREXA) 10 MG tablet Take 10 mg by mouth at bedtime. 06/08/14   [provider]  olmesartan (BENICAR) 5 MG tablet Take 5 mg by mouth daily.    [provider]  OneTouch Delica Lancets 01X MISC Use to test once daily.  DX code:E11.9 07/15/20   Martyn Malay, MD  pravastatin (PRAVACHOL) 40 MG tablet TAKE ONE TABLET BY MOUTH EVERY EVENING 02/07/21   Croitoru, Dani Gobble, MD  senna (SENOKOT) 8.6 MG TABS tablet Take 1 tablet (8.6 mg total) by mouth daily as needed for mild constipation. 03/13/21   Martyn Malay, MD  sodium bicarbonate 650 MG tablet Take 1 tablet (650 mg total) by mouth 2 (two) times daily. 07/02/20   Martyn Malay, MD  umeclidinium-vilanterol [facility] ELLIPTA) 62.5-25 MCG/ACT AEPB Inhale 1 puff into the lungs daily.    [provider]     Allergies:     Allergies  Allergen Reactions   Abilify [Aripiprazole] Anaphylaxis, Shortness Of Breath and Other (See Comments)    Tremors   Clozapine Anaphylaxis   Benadryl [Diphenhydramine Hcl] Other (See  Comments)    States affects her vision   Cogentin [Benztropine] Other (See Comments)    Affects mobility   Haloperidol Lactate Other (See Comments)    Vision changes, Shaking, Drooling   Latuda [Lurasidone Hcl] Other (See Comments)    "Tremors and shakes."    Lorazepam     Other reaction(s): Unknown   Remeron [Mirtazapine] Other (See Comments)    Tremors and shakes   Keflex [Cephalexin]     Per patient, has tongue swelling with keflex but can tolerate amoxicillin   Codeine Other (See Comments)    Unknown    Latex Rash     Physical Exam:   Vitals  Blood pressure (!) 180/75, pulse 76, temperature 99 F (37.2 C), temperature source Oral, resp. rate 18, weight 123 kg, SpO2 97 %.  1.  General: Appears in no acute distress  2. Psychiatric: Alert, oriented x4, intact insight and judgment  3. Neurologic: Cranial nerves II through XII grossly intact, no focal deficit noted, motor strength 5/5 in all extremities  4. HEENMT:  Atraumatic normocephalic, extraocular muscles are intact  5. Respiratory : Bilateral rhonchi auscultated  6. Cardiovascular : S1-S2, regular, no murmur auscultated, trace edema in the lower extremities  7. Gastrointestinal:  Abdomen is soft, nontender, no organomegaly    8. Skin:  No rashes noted     Data Review:    CBC Recent Labs  Lab 03/11/21 0810 03/15/21 0525 03/15/21 0547  WBC 13.3* 14.4*  --   HGB 13.3 12.8 12.9  HCT 41.4 39.5 38.0  PLT 324 283  --   MCV 88.1 88.6  --  MCH 28.3 28.7  --   MCHC 32.1 32.4  --   RDW 15.4 15.5  --   LYMPHSABS 3.4 3.4  --   MONOABS 0.7 0.9  --   EOSABS 0.4 0.4  --   BASOSABS 0.1 0.1  --    ------------------------------------------------------------------------------------------------------------------  Results for orders placed or performed during the hospital encounter of 03/15/21 (from the past 48 hour(s))  Comprehensive metabolic panel     Status: Abnormal   Collection Time: 03/15/21   5:25 AM  Result Value Ref Range   Sodium 136 135 - 145 mmol/L   Potassium 4.3 3.5 - 5.1 mmol/L   Chloride 106 98 - 111 mmol/L   CO2 20 (L) 22 - 32 mmol/L   Glucose, Bld 197 (H) 70 - 99 mg/dL    Comment: Glucose reference range applies only to samples taken after fasting for at least 8 hours.   BUN 31 (H) 8 - 23 mg/dL   Creatinine, Ser 1.65 (H) 0.44 - 1.00 mg/dL   Calcium 9.3 8.9 - 10.3 mg/dL   Total Protein 7.0 6.5 - 8.1 g/dL   Albumin 3.5 3.5 - 5.0 g/dL   AST 17 15 - 41 U/L   ALT 11 0 - 44 U/L   Alkaline Phosphatase 81 38 - 126 U/L   Total Bilirubin 0.6 0.3 - 1.2 mg/dL   GFR, Estimated 32 (L) >60 mL/min    Comment: (NOTE) Calculated using the CKD-EPI Creatinine Equation (2021)    Anion gap 10 5 - 15    Comment: Performed at [facility], [address] [place], [place] [address]  CBC with Differential     Status: Abnormal   Collection Time: 03/15/21  5:25 AM  Result Value Ref Range   WBC 14.4 (H) 4.0 - 10.5 K/uL   RBC 4.46 3.87 - 5.11 MIL/uL   Hemoglobin 12.8 12.0 - 15.0 g/dL   HCT 39.5 36.0 - 46.0 %   MCV 88.6 80.0 - 100.0 fL   MCH 28.7 26.0 - 34.0 pg   MCHC 32.4 30.0 - 36.0 g/dL   RDW 15.5 11.5 - 15.5 %   Platelets 283 150 - 400 K/uL   nRBC 0.0 0.0 - 0.2 %   Neutrophils Relative % 65 %   Neutro Abs 9.4 (H) 1.7 - 7.7 K/uL   Lymphocytes Relative 23 %   Lymphs Abs 3.4 0.7 - 4.0 K/uL   Monocytes Relative 6 %   Monocytes Absolute 0.9 0.1 - 1.0 K/uL   Eosinophils Relative 3 %   Eosinophils Absolute 0.4 0.0 - 0.5 K/uL   Basophils Relative 1 %   Basophils Absolute 0.1 0.0 - 0.1 K/uL   Immature Granulocytes 2 %   Abs Immature Granulocytes 0.23 (H) 0.00 - 0.07 K/uL    Comment: Performed at [facility], [address] [place], [place] [address]  Brain natriuretic peptide     Status: Abnormal   Collection Time: 03/15/21  5:25 AM  Result Value Ref Range   B Natriuretic Peptide 196.0 (H) 0.0 - 100.0 pg/mL    Comment: Performed at [facility], [address] [place], [place] [address]  Troponin I (High Sensitivity)     Status: None   Collection Time: 03/15/21  5:25 AM  Result Value Ref Range   Troponin I (High Sensitivity) 12 <18 ng/L    Comment: (NOTE) Elevated high sensitivity troponin I (hsTnI) values and significant  changes across serial  measurements may suggest ACS but many other  chronic and acute conditions are known to elevate hsTnI results.  Refer to the "Links" section for chest pain algorithms and additional  guidance. Performed at [facility], [address] [place], [place] [address]   Resp Panel by RT-PCR (Flu A&B, Covid) Nasopharyngeal Swab     Status: None   Collection Time: 03/15/21  5:36 AM   Specimen: Nasopharyngeal Swab; Nasopharyngeal(NP) swabs in vial transport medium  Result Value Ref Range   SARS Coronavirus 2 by RT PCR NEGATIVE NEGATIVE    Comment: (NOTE) SARS-CoV-2 target nucleic acids are NOT DETECTED.  The SARS-CoV-2 RNA is generally detectable in upper respiratory specimens during the acute phase of infection. The lowest concentration of SARS-CoV-2 viral copies this assay can detect is 138 copies/mL. A negative result does not preclude SARS-Cov-2 infection and should not be used as the sole basis for treatment or other patient management decisions. A negative result may occur with  improper specimen collection/handling, submission of specimen other than nasopharyngeal swab, presence of viral mutation(s) within the areas targeted by this assay, and inadequate number of viral copies(<138 copies/mL). A negative result must be combined with clinical observations, patient history, and epidemiological information. The expected result is Negative.  Fact Sheet for Patients:  EntrepreneurPulse.com.au  Fact Sheet for Healthcare Providers:  IncredibleEmployment.be  This test is no t yet approved or cleared by the [place] FDA and   has been authorized for detection and/or diagnosis of SARS-CoV-2 by FDA under an Emergency Use Authorization (EUA). This EUA will remain  in effect (meaning this test can be used) for the duration of the COVID-19 declaration under Section 564(b)(1) of the Act, 21 U.S.C.section 360bbb-3(b)(1), unless the authorization is terminated  or revoked sooner.       Influenza A by PCR NEGATIVE NEGATIVE   Influenza B by PCR NEGATIVE NEGATIVE    Comment: (NOTE) The Xpert Xpress SARS-CoV-2/FLU/RSV plus assay is intended as an aid in the diagnosis of influenza from Nasopharyngeal swab specimens and should not be used as a sole basis for treatment. Nasal washings and aspirates are unacceptable for Xpert Xpress SARS-CoV-2/FLU/RSV testing.  Fact Sheet for Patients: EntrepreneurPulse.com.au  Fact Sheet for Healthcare Providers: IncredibleEmployment.be  This test is not yet approved or cleared by the [place] FDA and has been authorized for detection and/or diagnosis of SARS-CoV-2 by FDA under an Emergency Use Authorization (EUA). This EUA will remain in effect (meaning this test can be used) for the duration of the COVID-19 declaration under Section 564(b)(1) of the Act, 21 U.S.C. section 360bbb-3(b)(1), unless the authorization is terminated or revoked.  Performed at [facility], [address] [place], [place] [address]   I-Stat venous blood gas, ED     Status: Abnormal   Collection Time: 03/15/21  5:47 AM  Result Value Ref Range   pH, Ven 7.287 7.25 - 7.43   pCO2, Ven 46.3 44 - 60 mmHg   pO2, Ven 35 32 - 45 mmHg   Bicarbonate 22.1 20.0 - 28.0 mmol/L   TCO2 24 22 - 32 mmol/L   O2 Saturation 60 %   Acid-base deficit 5.0 (H) 0.0 - 2.0 mmol/L   Sodium 137 135 - 145 mmol/L   Potassium 4.4 3.5 - 5.1 mmol/L   Calcium, Ion 1.36 1.15 - 1.40 mmol/L   HCT 38.0 36.0 - 46.0 %   Hemoglobin 12.9 12.0 - 15.0 g/dL   Patient temperature 98.6 F  Sample type VENOUS   Valproic acid level     Status: Abnormal   Collection Time: 03/15/21 10:15 AM  Result Value Ref Range   Valproic Acid Lvl 11 (L) 50.0 - 100.0 ug/mL    Comment: Performed at [facility], [address] [place], [place] [address]   *Note: Due to a large number of results and/or encounters for the requested time period, some results have not been displayed. A complete set of results can be found in Results Review.    Chemistries  Recent Labs  Lab 03/11/21 0810 03/15/21 0525 03/15/21 0547  NA 133* 136 137  K 4.8 4.3 4.4  CL 106 106  --   CO2 20* 20*  --   GLUCOSE 161* 197*  --   BUN 24* 31*  --   CREATININE 1.61* 1.65*  --   CALCIUM 9.3 9.3  --   AST 16 17  --   ALT 13 11  --   ALKPHOS 79 81  --   BILITOT 0.5 0.6  --    ------------------------------------------------------------------------------------------------------------------  ------------------------------------------------------------------------------------------------------------------ GFR: Estimated Creatinine Clearance: 37.6 mL/min (A) (by C-G formula based on SCr of 1.65 mg/dL (H)). Liver Function Tests: Recent Labs  Lab 03/11/21 0810 03/15/21 0525  AST 16 17  ALT 13 11  ALKPHOS 79 81  BILITOT 0.5 0.6  PROT 7.0 7.0  ALBUMIN 3.7 3.5     --------------------------------------------------------------------------------------------------------------- Urine analysis:    Component Value Date/Time   COLORURINE STRAW (A) 04/23/2020 1135   APPEARANCEUR CLEAR 04/23/2020 1135   LABSPEC 1.010 04/23/2020 1135   PHURINE 6.5 04/23/2020 1135   GLUCOSEU 250 (A) 04/23/2020 1135   HGBUR TRACE (A) 04/23/2020 1135   HGBUR moderate 02/17/2010 1052   BILIRUBINUR negative 02/13/2021 1500   BILIRUBINUR NEG 02/22/2015 1401   KETONESUR negative 02/13/2021 1500   KETONESUR NEGATIVE 04/23/2020 1135   PROTEINUR >=300 (A) 02/13/2021 1500   PROTEINUR 100 (A) 04/23/2020 1135   UROBILINOGEN  0.2 02/13/2021 1500   UROBILINOGEN 0.2 11/23/2010 1153   NITRITE Negative 02/13/2021 1500   NITRITE NEGATIVE 04/23/2020 1135   LEUKOCYTESUR Negative 02/13/2021 1500   LEUKOCYTESUR NEGATIVE 04/23/2020 1135      Imaging Results:    US Venous Img Lower Bilateral  Result Date: 03/15/2021 CLINICAL DATA:  Bilateral lower extremity edema. Shortness of breath. Evaluate for DVT. EXAM: BILATERAL LOWER EXTREMITY VENOUS DOPPLER ULTRASOUND TECHNIQUE: Gray-scale sonography with graded compression, as well as color Doppler and duplex ultrasound were performed to evaluate the lower extremity deep venous systems from the level of the common femoral vein and including the common femoral, femoral, profunda femoral, popliteal and calf veins including the posterior tibial, peroneal and gastrocnemius veins when visible. The superficial great saphenous vein was also interrogated. Spectral Doppler was utilized to evaluate flow at rest and with distal augmentation maneuvers in the common femoral, femoral and popliteal veins. COMPARISON:  None. FINDINGS: Examination degraded due to patient body habitus and poor sonographic window. RIGHT LOWER EXTREMITY Common Femoral Vein: No evidence of thrombus. Normal compressibility, respiratory phasicity and response to augmentation. Saphenofemoral Junction: No evidence of thrombus. Normal compressibility and flow on color Doppler imaging. Profunda Femoral Vein: No evidence of thrombus. Normal compressibility and flow on color Doppler imaging. Femoral Vein: No evidence of thrombus. Normal compressibility, respiratory phasicity and response to augmentation. Popliteal Vein: No evidence of thrombus. Normal compressibility, respiratory phasicity and response to augmentation. Calf Veins: Suboptimally visualized. Superficial Great Saphenous Vein: No evidence of thrombus. Normal  compressibility and flow on color Doppler imaging Other Findings:  None. LEFT LOWER EXTREMITY Common Femoral Vein: No  evidence of thrombus. Normal compressibility, respiratory phasicity and response to augmentation. Saphenofemoral Junction: No evidence of thrombus. Normal compressibility and flow on color Doppler imaging. Profunda Femoral Vein: No evidence of thrombus. Normal compressibility and flow on color Doppler imaging. Femoral Vein: No evidence of thrombus. Normal compressibility, respiratory phasicity and response to augmentation. Popliteal Vein: No evidence of thrombus. Normal compressibility, respiratory phasicity and response to augmentation. Calf Veins: Suboptimally visualized Superficial Great Saphenous Vein: No evidence of thrombus. Normal compressibility. Other Findings:  None. IMPRESSION: No evidence of DVT within either lower extremity. Electronically Signed   By: Sandi Mariscal M.D.   On: 03/15/2021 08:43   DG Chest Port 1 View  Result Date: 03/15/2021 CLINICAL DATA:  Shortness of breath. EXAM: PORTABLE CHEST 1 VIEW COMPARISON:  06/07/2020. FINDINGS: 0544 hours. The cardio pericardial silhouette is enlarged. There is pulmonary vascular congestion without overt pulmonary edema. Diffuse interstitial opacity suggests edema. No focal airspace consolidation. No substantial pleural effusion. Bones are diffusely demineralized. IMPRESSION: Findings compatible with CHF. Electronically Signed   By: Misty Stanley M.D.   On: 03/15/2021 07:03    My personal review of EKG: Rhythm -paced rhythm   Assessment & Plan:    Principal Problem:   Acute exacerbation of CHF (congestive heart failure) (HCC) Active Problems:   COPD exacerbation (HCC)   COPD exacerbation-patient presented with worsening shortness of breath, has bilateral rhonchi.  Will start DuoNebs every 6 hours scheduled, Solu-Medrol 40 mg IV every 12 hours, doxycycline 100 mg every 12 hours. Acute hypoxemic respiratory failure-secondary to COPD exacerbation and possibly some element of CHF.  Patient is currently requiring 2 L/min of oxygen.  Started on  treatment as above.  We will try to wean off oxygen when patient is more stable. ?  Acute on chronic systolic CHF-chest x-ray shows CHF, she received Lasix 20 mg IV in the ED.  Will nongranular Lasix and monitor patient's progress.  Follow renal function in a.m. CKD stage IIIb-creatinine at baseline History of atrial fibrillation-heart rate controlled, continue metoprolol, anticoagulation with Eliquis. Hypertension-continue home medication including amlodipine, Benicar, metoprolol Constipation-start MiraLAX 17 g p.o. twice daily Diabetes mellitus type 2-start sliding scale insulin NovoLog.  Check CBG 4 times a day.    DVT Prophylaxis-   Lovenox   AM Labs Ordered, also please review Full Orders  Family Communication: Admission, patients condition and plan of care including tests being ordered have been discussed with the patient who indicate understanding and agree with the plan and Code Status.  Code Status: Full code  Admission status: Observation  Time spent in minutes : 60 minutes   Antonius Hartlage S Colleene Swarthout M.D

## 2021-03-15 NOTE — ED Triage Notes (Signed)
Pt is c./o shortness of breath since Sunday  Pt's son states her breathing has been getting worse since Sunday  At home sats have been in the 80s with resp rate in the 30s  Pt has some wheezing noted  Pt has hx of COPD ?

## 2021-03-15 NOTE — ED Notes (Signed)
Report given to [facility]. ETA 20 minutes  ?

## 2021-03-15 NOTE — ED Notes (Signed)
Ultrasound at bedside

## 2021-03-15 NOTE — ED Notes (Signed)
Attempted report to 4W. Left number for RN to call back.  ?

## 2021-03-15 NOTE — ED Notes (Signed)
Report given to charge nurse on 4W. [facility] arrived to bedside ?

## 2021-03-15 NOTE — ED Provider Notes (Signed)
Hypoxia and cough, already on outpt doxycycline. Hypoxia improved with nebulizer tx. Lasix given. F/U DVT study. Probable admission. ?Physical Exam  ?BP (!) 165/78   Pulse 73   Temp 98.6 ?F (37 ?C) (Oral)   Resp (!) 21   Wt 123 kg   SpO2 93%   BMI 43.77 kg/m?  ? ?Physical Exam ? ?Procedures  ?Procedures ? ?ED Course / MDM  ?  ?Medical Decision Making ?Amount and/or Complexity of Data Reviewed ?Labs: ordered. ?Radiology: ordered. ?ECG/medicine tests: ordered. ? ?Risk ?OTC drugs. ?Prescription drug management. ? ?On reassessment, patient continues to appear mildly to moderate dyspneic at rest.  2 L of oxygen turned off.  On 2 L patient was at 96% off of oxygen she ranged from 90 to 93%.  Patient does not have baseline oxygen at home.  Repeat physical exam showed patient has some persistent crackle and expiratory wheeze.  At this time clinically does not appear appropriate for discharge.  I have ordered as needed DuoNebs.  Patient had a dose of Lasix and is actively diuresing.  I have transitioned her to 1 L nasal cannula oxygen.  On 1 L she is at about 94%.  Patient's mental status is clear.  He does not have signs of impending respiratory failure.  Appropriate for admission.  I have reordered some of patient's home medications.  Patient had a dose of her doxycycline as was prescribed on outpatient basis ? ?Consult: Reviewed with Dr. Reesa Chew [facility] hospitalist for admission. ? ? ? ? ?  ?Charlesetta Shanks, MD ?03/15/21 [phone] ? ?

## 2021-03-15 NOTE — Plan of Care (Signed)
  Problem: Clinical Measurements: Goal: Will remain free from infection Outcome: Progressing Goal: Respiratory complications will improve Outcome: Progressing Goal: Cardiovascular complication will be avoided Outcome: Progressing   

## 2021-03-16 ENCOUNTER — Observation Stay (HOSPITAL_COMMUNITY): Payer: Medicare Other

## 2021-03-16 DIAGNOSIS — K5900 Constipation, unspecified: Secondary | ICD-10-CM | POA: Diagnosis present

## 2021-03-16 DIAGNOSIS — I130 Hypertensive heart and chronic kidney disease with heart failure and stage 1 through stage 4 chronic kidney disease, or unspecified chronic kidney disease: Secondary | ICD-10-CM | POA: Diagnosis present

## 2021-03-16 DIAGNOSIS — K219 Gastro-esophageal reflux disease without esophagitis: Secondary | ICD-10-CM | POA: Diagnosis present

## 2021-03-16 DIAGNOSIS — N1832 Chronic kidney disease, stage 3b: Secondary | ICD-10-CM | POA: Diagnosis present

## 2021-03-16 DIAGNOSIS — I5023 Acute on chronic systolic (congestive) heart failure: Secondary | ICD-10-CM | POA: Diagnosis not present

## 2021-03-16 DIAGNOSIS — J9601 Acute respiratory failure with hypoxia: Secondary | ICD-10-CM | POA: Diagnosis present

## 2021-03-16 DIAGNOSIS — D72829 Elevated white blood cell count, unspecified: Secondary | ICD-10-CM | POA: Diagnosis present

## 2021-03-16 DIAGNOSIS — J441 Chronic obstructive pulmonary disease with (acute) exacerbation: Secondary | ICD-10-CM | POA: Diagnosis not present

## 2021-03-16 DIAGNOSIS — T380X5A Adverse effect of glucocorticoids and synthetic analogues, initial encounter: Secondary | ICD-10-CM | POA: Diagnosis present

## 2021-03-16 DIAGNOSIS — Z885 Allergy status to narcotic agent status: Secondary | ICD-10-CM | POA: Diagnosis not present

## 2021-03-16 DIAGNOSIS — Z888 Allergy status to other drugs, medicaments and biological substances status: Secondary | ICD-10-CM | POA: Diagnosis not present

## 2021-03-16 DIAGNOSIS — R0602 Shortness of breath: Secondary | ICD-10-CM | POA: Diagnosis not present

## 2021-03-16 DIAGNOSIS — Z950 Presence of cardiac pacemaker: Secondary | ICD-10-CM | POA: Diagnosis not present

## 2021-03-16 DIAGNOSIS — M109 Gout, unspecified: Secondary | ICD-10-CM | POA: Diagnosis present

## 2021-03-16 DIAGNOSIS — R0989 Other specified symptoms and signs involving the circulatory and respiratory systems: Secondary | ICD-10-CM

## 2021-03-16 DIAGNOSIS — Z91040 Latex allergy status: Secondary | ICD-10-CM | POA: Diagnosis not present

## 2021-03-16 DIAGNOSIS — Z20822 Contact with and (suspected) exposure to covid-19: Secondary | ICD-10-CM | POA: Diagnosis present

## 2021-03-16 DIAGNOSIS — E1122 Type 2 diabetes mellitus with diabetic chronic kidney disease: Secondary | ICD-10-CM | POA: Diagnosis present

## 2021-03-16 DIAGNOSIS — I442 Atrioventricular block, complete: Secondary | ICD-10-CM | POA: Diagnosis present

## 2021-03-16 DIAGNOSIS — Z8249 Family history of ischemic heart disease and other diseases of the circulatory system: Secondary | ICD-10-CM | POA: Diagnosis not present

## 2021-03-16 DIAGNOSIS — F17210 Nicotine dependence, cigarettes, uncomplicated: Secondary | ICD-10-CM | POA: Diagnosis present

## 2021-03-16 DIAGNOSIS — Z833 Family history of diabetes mellitus: Secondary | ICD-10-CM | POA: Diagnosis not present

## 2021-03-16 DIAGNOSIS — Z881 Allergy status to other antibiotic agents status: Secondary | ICD-10-CM | POA: Diagnosis not present

## 2021-03-16 DIAGNOSIS — I2510 Atherosclerotic heart disease of native coronary artery without angina pectoris: Secondary | ICD-10-CM | POA: Diagnosis present

## 2021-03-16 DIAGNOSIS — I517 Cardiomegaly: Secondary | ICD-10-CM | POA: Diagnosis not present

## 2021-03-16 DIAGNOSIS — L0390 Cellulitis, unspecified: Secondary | ICD-10-CM | POA: Diagnosis present

## 2021-03-16 DIAGNOSIS — I4891 Unspecified atrial fibrillation: Secondary | ICD-10-CM | POA: Diagnosis present

## 2021-03-16 LAB — COMPREHENSIVE METABOLIC PANEL
ALT: 13 U/L (ref 0–44)
AST: 17 U/L (ref 15–41)
Albumin: 3.5 g/dL (ref 3.5–5.0)
Alkaline Phosphatase: 77 U/L (ref 38–126)
Anion gap: 8 (ref 5–15)
BUN: 36 mg/dL — ABNORMAL HIGH (ref 8–23)
CO2: 22 mmol/L (ref 22–32)
Calcium: 9.6 mg/dL (ref 8.9–10.3)
Chloride: 105 mmol/L (ref 98–111)
Creatinine, Ser: 1.64 mg/dL — ABNORMAL HIGH (ref 0.44–1.00)
GFR, Estimated: 32 mL/min — ABNORMAL LOW (ref >60)
Glucose, Bld: 241 mg/dL — ABNORMAL HIGH (ref 70–99)
Potassium: 4.2 mmol/L (ref 3.5–5.1)
Sodium: 135 mmol/L (ref 135–145)
Total Bilirubin: 0.3 mg/dL (ref 0.3–1.2)
Total Protein: 6.7 g/dL (ref 6.5–8.1)

## 2021-03-16 LAB — GLUCOSE, CAPILLARY
Glucose-Capillary: 213 mg/dL — ABNORMAL HIGH (ref 70–99)
Glucose-Capillary: 194 mg/dL — ABNORMAL HIGH (ref 70–99)
Glucose-Capillary: 156 mg/dL — ABNORMAL HIGH (ref 70–99)
Glucose-Capillary: 212 mg/dL — ABNORMAL HIGH (ref 70–99)

## 2021-03-16 LAB — CBC
HCT: 40.8 % (ref 36.0–46.0)
Hemoglobin: 12.9 g/dL (ref 12.0–15.0)
MCH: 28.3 pg (ref 26.0–34.0)
MCHC: 31.6 g/dL (ref 30.0–36.0)
MCV: 89.5 fL (ref 80.0–100.0)
Platelets: 271 10*3/uL (ref 150–400)
RBC: 4.56 MIL/uL (ref 3.87–5.11)
RDW: 15.4 % (ref 11.5–15.5)
WBC: 13.5 10*3/uL — ABNORMAL HIGH (ref 4.0–10.5)
nRBC: 0 % (ref 0.0–0.2)

## 2021-03-16 MED ORDER — IPRATROPIUM BROMIDE 0.02 % IN SOLN: 0.5000 mg | RESPIRATORY_TRACT | Status: DC

## 2021-03-16 MED ORDER — AMLODIPINE BESYLATE 10 MG PO TABS
10.0000 mg | ORAL_TABLET | Freq: Every day | ORAL | Status: DC
Start: 2021-03-16 — End: 2021-03-18
  Administered 2021-03-16 – 2021-03-17 (×2): 10 mg via ORAL
  Filled 2021-03-16: qty 1

## 2021-03-16 MED ORDER — INSULIN GLARGINE-YFGN 100 UNIT/ML ~~LOC~~ SOLN
10.0000 [IU] | Freq: Every day | SUBCUTANEOUS | Status: DC
  Administered 2021-03-16 – 2021-03-17 (×2): 10 [IU] via SUBCUTANEOUS
  Filled 2021-03-16 (×4): qty 0.1

## 2021-03-16 MED ORDER — ALBUTEROL SULFATE (2.5 MG/3ML) 0.083% IN NEBU: 2.5000 mg | INHALATION_SOLUTION | RESPIRATORY_TRACT | Status: DC

## 2021-03-16 MED ORDER — IPRATROPIUM-ALBUTEROL 0.5-2.5 (3) MG/3ML IN SOLN
3.0000 mL | RESPIRATORY_TRACT | Status: DC
  Filled 2021-03-16: qty 3

## 2021-03-16 MED ORDER — ALBUTEROL SULFATE (2.5 MG/3ML) 0.083% IN NEBU: 2.5000 mg | INHALATION_SOLUTION | RESPIRATORY_TRACT | Status: DC | PRN

## 2021-03-16 MED ORDER — IPRATROPIUM BROMIDE 0.02 % IN SOLN: 0.5000 mg | Freq: Four times a day (QID) | RESPIRATORY_TRACT | Status: DC

## 2021-03-16 MED ORDER — HYDROXYZINE HCL 25 MG PO TABS
25.0000 mg | ORAL_TABLET | Freq: Once | ORAL | Status: AC
  Administered 2021-03-16: 25 mg via ORAL
  Filled 2021-03-16: qty 1

## 2021-03-16 MED ORDER — ALBUTEROL SULFATE (2.5 MG/3ML) 0.083% IN NEBU: 2.5000 mg | INHALATION_SOLUTION | Freq: Four times a day (QID) | RESPIRATORY_TRACT | Status: DC

## 2021-03-16 MED ORDER — IPRATROPIUM-ALBUTEROL 0.5-2.5 (3) MG/3ML IN SOLN
3.0000 mL | Freq: Four times a day (QID) | RESPIRATORY_TRACT | Status: DC
  Administered 2021-03-16 – 2021-03-17 (×6): 3 mL via RESPIRATORY_TRACT
  Filled 2021-03-16 (×5): qty 3

## 2021-03-16 MED ORDER — FUROSEMIDE 10 MG/ML IJ SOLN
20.0000 mg | Freq: Once | INTRAMUSCULAR | Status: AC
  Administered 2021-03-16: 20 mg via INTRAVENOUS
  Filled 2021-03-16: qty 2

## 2021-03-16 MED ORDER — GUAIFENESIN ER 600 MG PO TB12
1200.0000 mg | ORAL_TABLET | Freq: Two times a day (BID) | ORAL | Status: DC
  Administered 2021-03-16 – 2021-03-18 (×5): 1200 mg via ORAL
  Filled 2021-03-16 (×5): qty 2

## 2021-03-16 NOTE — Progress Notes (Signed)
?   03/16/21 0158  ?Vitals  ?Temp 98.5 ?F (36.9 ?C)  ?Temp Source Oral  ?BP (!) 184/96  ?MAP (mmHg) 121  ?BP Location Left Arm  ?BP Method Automatic  ?Patient Position (if appropriate) Lying  ?Pulse Rate 73  ?Pulse Rate Source [place]amap  ?Resp (!) 30  ?Level of Consciousness  ?Level of Consciousness Alert  ?MEWS COLOR  ?MEWS Score Color Yellow  ?Oxygen Therapy  ?SpO2 97 %  ?O2 Device Nasal Cannula  ?O2 Flow Rate (L/min) 2 L/min  ? ?Patient had audible wheezes, labored breathing. Referred to Gershon Cull NP. Will be giving her due albuterol. Will observe.  ?

## 2021-03-16 NOTE — Progress Notes (Signed)
[facility] ? ?PROGRESS NOTE ? ?Abigail Wallace TSV:[id number] DOB: [birth year] DOA: 03/15/2021 ?PCP: Martyn Malay, MD ? ? ?Brief HPI:   ? Abigail Wallace  is a 76 y.o. female,With history of CKD stage IIIb, hypertension, atrial fibrillation, on chronic anticoagulation with Eliquis, permanent pacemaker for complete heart block, diabetes mellitus type 2 presented to ED for worsening shortness of breath.  Patient said that she has been getting short of breath since Sunday. Denies any chest pain.  In the ED she was found to be hypoxemic, she was started on 2 L of oxygen via nasal cannula.  Chest x-ray showed possible CHF.  Patient was given 1 dose of Lasix 20 mg IV.  She also received 1 dose of Solu-Medrol 125 mg IV. she also has been taking doxycycline which was prescribed as outpatient. ? ? ? ?Subjective  ? ?Patient complains of anxiety this morning.  She is coughing up clear phlegm. ? ? Assessment/Plan:  ? ?COPD exacerbation ?-Presented with worsening shortness of breath ?-Started on Solu-Medrol 40 mg IV every 12 hours, doxycycline 100 mg every 12 hours, DuoNeb scheduled every 6 hours ?-Still has rhonchi bilaterally; start flutter valve every 4 hours ?-Start Mucinex 1200 mg p.o. twice daily ? ?Acute hypoxemic respiratory failure ?-Secondary to COPD exacerbation ?-Patient is currently requiring 3 times per minute of oxygen via nasal cannula ?-Treatment as above ? ?Acute on chronic systolic CHF ?-Patient received Lasix 20 mg IV this morning ?-Repeat chest x-ray shows improvement in CHF ?-Creatinine stable at 1.64, BUN up to 36 ?-We will hold further Lasix and monitor patient response to above  ? ?CKD stage IIIb ?-Creatinine at baseline ? ?History of atrial fibrillation ?-Heart rate is controlled ?-Continue metoprolol, continue anticoagulation with Eliquis ? ?Hypertension ?-Blood pressure is mildly elevated ?-Continue amlodipine, Benicar, metoprolol ? ?Constipation ?-Started on MiraLAX twice a day ? ?Diabetes  mellitus type 2 ?-CBG elevated due to steroids ?-Continue Tradjenta, sliding scale insulin with NovoLog ?-Start Lantus 10 units subcu daily ? ? ? ? ? ?Medications ? ?  ? allopurinol  100 mg Oral Daily  ? amLODipine  10 mg Oral q1800  ? apixaban  5 mg Oral BID  ? cholecalciferol  1,000 Units Oral Daily  ? divalproex  250 mg Oral QHS  ? doxycycline  100 mg Oral BID  ? guaiFENesin  1,200 mg Oral BID  ? insulin aspart  0-9 Units Subcutaneous TID WC  ? insulin glargine-yfgn  10 Units Subcutaneous QHS  ? ipratropium-albuterol  3 mL Nebulization Q6H  ? irbesartan  37.5 mg Oral Daily  ? linagliptin  5 mg Oral Daily  ? methylPREDNISolone (SOLU-MEDROL) injection  40 mg Intravenous Q12H  ? metoprolol tartrate  100 mg Oral BID  ? OLANZapine  10 mg Oral QHS  ? polyethylene glycol  17 g Oral BID  ? sodium chloride flush  3 mL Intravenous Q12H  ? ? ? Data Reviewed:  ? ?CBG: ? ?Recent Labs  ?Lab 03/15/21 ?1800 03/15/21 ?2148 03/16/21 ?9233 03/16/21 ?1122 03/16/21 ?1613  ?GLUCAP 244* 197* 213* 194* 156*  ? ? ?SpO2: 98 % ?O2 Flow Rate (L/min): 3 L/min ?FiO2 (%): 98 %  ? ? ?Vitals:  ? 03/16/21 0735 03/16/21 1125 03/16/21 1327 03/16/21 1412  ?BP: (!) 156/103 (!) 166/71  (!) 159/62  ?Pulse: 76 70  70  ?Resp: (!) 22 (!) 22  (!) 22  ?Temp: 98.7 ?F (37.1 ?C) 98.4 ?F (36.9 ?C)  98.7 ?F (37.1 ?C)  ?TempSrc: Oral  Oral  Oral  ?SpO2: 96% 97% 96% 98%  ?Weight:      ?Height:      ? ? ? ? ?Data Reviewed: ? ?Basic Metabolic Panel: ?Recent Labs  ?Lab 03/11/21 ?7939 03/15/21 ?0300 03/15/21 ?9233 03/16/21 ?0076  ?NA 133* 136 137 135  ?K 4.8 4.3 4.4 4.2  ?CL 106 106  --  105  ?CO2 20* 20*  --  22  ?GLUCOSE 161* 197*  --  241*  ?BUN 24* 31*  --  36*  ?CREATININE 1.61* 1.65*  --  1.64*  ?CALCIUM 9.3 9.3  --  9.6  ? ? ?CBC: ?Recent Labs  ?Lab 03/11/21 ?2263 03/15/21 ?3354 03/15/21 ?5625 03/16/21 ?6389  ?WBC 13.3* 14.4*  --  13.5*  ?NEUTROABS 8.5* 9.4*  --   --   ?HGB 13.3 12.8 12.9 12.9  ?HCT 41.4 39.5 38.0 40.8  ?MCV 88.1 88.6  --  89.5  ?PLT 324 283  --   271  ? ? ?LFT ?Recent Labs  ?Lab 03/11/21 ?3734 03/15/21 ?2876 03/16/21 ?8115  ?AST 16 17 17   ?ALT 13 11 13   ?ALKPHOS 79 81 77  ?BILITOT 0.5 0.6 0.3  ?PROT 7.0 7.0 6.7  ?ALBUMIN 3.7 3.5 3.5  ? ?  ?Antibiotics: ?Anti-infectives (From admission, onward)  ? ? Start     Dose/Rate Route Frequency Ordered Stop  ? 03/15/21 1633  doxycycline (VIBRA-TABS) tablet 100 mg       ? 100 mg Oral 2 times daily 03/15/21 1613    ? 03/15/21 0615  doxycycline (VIBRA-TABS) tablet 100 mg       ? 100 mg Oral  Once 03/15/21 7262 03/15/21 0616  ? ?  ? ? ? ?DVT prophylaxis: Apixaban ? ?Code Status: Full code ? ?Family Communication: Discussed with patient's son at bedside ? ? ?CONSULTS  ? ? ?Objective  ? ? ?Physical Examination: ? ? ?General-appears in no acute distress ?Heart-S1-S2, regular, no murmur auscultated ?Lungs-bilateral rhonchi auscultated ?Abdomen-soft, nontender, no organomegaly ?Extremities-no edema in the lower extremities ?Neuro-alert, oriented x3, no focal deficit noted ? ?Status is: Inpatient:   ? ? ? ?  ? ? ? ? ? ? ? ?Oswald Hillock ?  ?[facility] ?If 7PM-7AM, please contact night-coverage at [email or web address], ?Office  [phone] ? ? ?03/16/2021, 6:21 PM  LOS: 0 days  ? ? ? ? ? ? ? ? ? ? ?  ?

## 2021-03-16 NOTE — Progress Notes (Signed)
Inpatient Diabetes Program Recommendations ? ?AACE/ADA: New Consensus Statement on Inpatient Glycemic Control (2015) ? ?Target Ranges:  Prepandial:   less than 140 mg/dL ?     Peak postprandial:   less than 180 mg/dL (1-2 hours) ?     Critically ill patients:  140 - 180 mg/dL  ? ?Lab Results  ?Component Value Date  ? GLUCAP 213 (H) 03/16/2021  ? HGBA1C 6.1 (H) 03/15/2021  ? ? ?Review of Glycemic Control ? Latest Reference Range & Units 03/15/21 18:00 03/15/21 21:48 03/16/21 07:27  ?Glucose-Capillary 70 - 99 mg/dL 244 (H) 197 (H) 213 (H)  ? ?Diabetes history: DM 2 ?Outpatient Diabetes medications ?Tradjenta 5 mg daily ?Current orders for Inpatient glycemic control:  ?Novolog sensitive tid with meals ?Tradjenta 5 mg daily ?Solumedrol 40 mg q 12 hours ?Inpatient Diabetes Program Recommendations:   ? ?While in the hospital, consider adding Levemir 6 units bid.  ? ?Thanks,  ?Adah Perl, RN, BC-ADM ?Inpatient Diabetes Coordinator ?Pager [phone]  (8a-5p) ? ? ?

## 2021-03-16 NOTE — Hospital Course (Signed)
Abigail Wallace  is a 76 y.o. female,With history of CKD stage IIIb, hypertension, atrial fibrillation, on chronic anticoagulation with Eliquis, permanent pacemaker for complete heart block, diabetes mellitus type 2 presented to ED for worsening shortness of breath.  Patient said that she has been getting short of breath since Sunday. Denies any chest pain.  In the ED she was found to be hypoxemic, she was started on 2 L of oxygen via nasal cannula.  Chest x-ray showed possible CHF.  Patient was given 1 dose of Lasix 20 mg IV.  She also received 1 dose of Solu-Medrol 125 mg IV. she also has been taking doxycycline which was prescribed as outpatient. ?

## 2021-03-16 NOTE — Progress Notes (Signed)
Patient continues to have increased SOB and calls for prn BD. She tends to have little response to nebs, has increased RR and hypertensive. Contacted NP for CXR and possible BiPAP. Increased nebs to Q4 hours. RN aware and at bedside.  ?

## 2021-03-17 LAB — CBC
HCT: 42.7 % (ref 36.0–46.0)
Hemoglobin: 13.3 g/dL (ref 12.0–15.0)
MCH: 28.2 pg (ref 26.0–34.0)
MCHC: 31.1 g/dL (ref 30.0–36.0)
MCV: 90.5 fL (ref 80.0–100.0)
Platelets: 306 10*3/uL (ref 150–400)
RBC: 4.72 MIL/uL (ref 3.87–5.11)
RDW: 15.6 % — ABNORMAL HIGH (ref 11.5–15.5)
WBC: 19.3 10*3/uL — ABNORMAL HIGH (ref 4.0–10.5)
nRBC: 0 % (ref 0.0–0.2)

## 2021-03-17 LAB — GLUCOSE, CAPILLARY
Glucose-Capillary: 202 mg/dL — ABNORMAL HIGH (ref 70–99)
Glucose-Capillary: 211 mg/dL — ABNORMAL HIGH (ref 70–99)
Glucose-Capillary: 212 mg/dL — ABNORMAL HIGH (ref 70–99)
Glucose-Capillary: 229 mg/dL — ABNORMAL HIGH (ref 70–99)

## 2021-03-17 LAB — BASIC METABOLIC PANEL
Anion gap: 10 (ref 5–15)
BUN: 53 mg/dL — ABNORMAL HIGH (ref 8–23)
CO2: 21 mmol/L — ABNORMAL LOW (ref 22–32)
Calcium: 9.5 mg/dL (ref 8.9–10.3)
Chloride: 104 mmol/L (ref 98–111)
Creatinine, Ser: 1.75 mg/dL — ABNORMAL HIGH (ref 0.44–1.00)
GFR, Estimated: 29 mL/min — ABNORMAL LOW (ref >60)
Glucose, Bld: 203 mg/dL — ABNORMAL HIGH (ref 70–99)
Potassium: 4.8 mmol/L (ref 3.5–5.1)
Sodium: 135 mmol/L (ref 135–145)

## 2021-03-17 MED ORDER — MELATONIN 5 MG PO TABS
5.0000 mg | ORAL_TABLET | Freq: Once | ORAL | Status: DC
  Filled 2021-03-17: qty 1

## 2021-03-17 MED ORDER — IPRATROPIUM-ALBUTEROL 0.5-2.5 (3) MG/3ML IN SOLN
3.0000 mL | Freq: Three times a day (TID) | RESPIRATORY_TRACT | Status: DC
  Administered 2021-03-18: 3 mL via RESPIRATORY_TRACT
  Filled 2021-03-17: qty 3

## 2021-03-17 MED ORDER — HYDROXYZINE HCL 25 MG PO TABS
25.0000 mg | ORAL_TABLET | Freq: Once | ORAL | Status: AC
  Administered 2021-03-17: 25 mg via ORAL
  Filled 2021-03-17: qty 1

## 2021-03-17 MED ORDER — DICLOFENAC SODIUM 1 % EX GEL
2.0000 g | Freq: Four times a day (QID) | CUTANEOUS | Status: DC | PRN
  Filled 2021-03-17: qty 100

## 2021-03-17 MED ORDER — ALBUTEROL SULFATE (2.5 MG/3ML) 0.083% IN NEBU: 2.5000 mg | INHALATION_SOLUTION | Freq: Four times a day (QID) | RESPIRATORY_TRACT | Status: DC | PRN

## 2021-03-17 MED ORDER — PREDNISONE 20 MG PO TABS
40.0000 mg | ORAL_TABLET | Freq: Every day | ORAL | Status: DC
  Administered 2021-03-18: 40 mg via ORAL
  Filled 2021-03-17: qty 2

## 2021-03-17 NOTE — Plan of Care (Signed)
  Problem: Clinical Measurements: Goal: Will remain free from infection Outcome: Progressing Goal: Diagnostic test results will improve Outcome: Progressing   Problem: Safety: Goal: Ability to remain free from injury will improve Outcome: Progressing   

## 2021-03-17 NOTE — Progress Notes (Signed)
[facility] ? ?PROGRESS NOTE ? ?Abigail Wallace QQV:[id number] DOB: Jan 16, 1946 DOA: 03/15/2021 ?PCP: Martyn Malay, MD ? ? ?Brief HPI:   ? Abigail Wallace  is a 76 y.o. female,With history of CKD stage IIIb, hypertension, atrial fibrillation, on chronic anticoagulation with Eliquis, permanent pacemaker for complete heart block, diabetes mellitus type 2 presented to ED for worsening shortness of breath.  Patient said that she has been getting short of breath since Sunday. Denies any chest pain.  In the ED she was found to be hypoxemic, she was started on 2 L of oxygen via nasal cannula.  Chest x-ray showed possible CHF.  Patient was given 1 dose of Lasix 20 mg IV.  She also received 1 dose of Solu-Medrol 125 mg IV. she also has been taking doxycycline which was prescribed as outpatient. ? ? ?Subjective  ? ?Patient seen and examined, breathing has improved today.  Wants to go home.  Still requiring oxygen 2 L/min via nasal cannula. ? ? Assessment/Plan:  ? ?COPD exacerbation ?-Presented with worsening shortness of breath ?-Improved ?-Started on Solu-Medrol 40 mg IV every 12 hours, doxycycline 100 mg every 12 hours, DuoNeb scheduled every 6 hours ?-We will discontinue Solu-Medrol after tonight's dose and start prednisone 40 mg daily from tomorrow morning ?-Continue flutter valve every 4 hours ?-Start Mucinex 1200 mg p.o. twice daily ? ?Acute hypoxemic respiratory failure ?-Secondary to COPD exacerbation ?-Patient is currently requiring 2 L/min of oxygen ?-Wean oxygen as able ? ? ?Acute on chronic systolic CHF ?-Patient received Lasix 20 mg IV this morning ?-Repeat chest x-ray shows improvement in CHF ?-Creatinine is up to 1.75, hold further doses of Lasix ?Follow BMP in am ? ?CKD stage IIIb ?-Creatinine at baseline ? ?History of atrial fibrillation ?-Heart rate is controlled ?-Continue metoprolol,  ?-continue anticoagulation with Eliquis ? ?Hypertension ?-Blood pressure is mildly elevated ?-Continue amlodipine,  Benicar, metoprolol ? ?Constipation ?-Started on MiraLAX twice a day ? ?Diabetes mellitus type 2 ?-CBG elevated due to steroids ?-Continue Tradjenta, sliding scale insulin with NovoLog ?-Started on  Lantus 10 units subcu daily ?-IV Solu-Medrol will be discontinued after tonight's dose and prednisone will be started as above ? ? ?Medications ? ?  ? allopurinol  100 mg Oral Daily  ? amLODipine  10 mg Oral q1800  ? apixaban  5 mg Oral BID  ? cholecalciferol  1,000 Units Oral Daily  ? divalproex  250 mg Oral QHS  ? doxycycline  100 mg Oral BID  ? guaiFENesin  1,200 mg Oral BID  ? insulin aspart  0-9 Units Subcutaneous TID WC  ? insulin glargine-yfgn  10 Units Subcutaneous QHS  ? ipratropium-albuterol  3 mL Nebulization Q6H  ? irbesartan  37.5 mg Oral Daily  ? linagliptin  5 mg Oral Daily  ? methylPREDNISolone (SOLU-MEDROL) injection  40 mg Intravenous Q12H  ? metoprolol tartrate  100 mg Oral BID  ? OLANZapine  10 mg Oral QHS  ? polyethylene glycol  17 g Oral BID  ? [START ON 03/18/2021] predniSONE  40 mg Oral Q breakfast  ? sodium chloride flush  3 mL Intravenous Q12H  ? ? ? Data Reviewed:  ? ?CBG: ? ?Recent Labs  ?Lab 03/16/21 ?1122 03/16/21 ?1613 03/16/21 ?2009 03/17/21 ?0719 03/17/21 ?1120  ?GLUCAP 194* 156* 212* 202* 211*  ? ? ?SpO2: 94 % ?O2 Flow Rate (L/min): 2 L/min ?FiO2 (%): 98 %  ? ? ?Vitals:  ? 03/16/21 2015 03/17/21 3329 03/17/21 5188 03/17/21 0943  ?BP: 119/83 Marland Kitchen)  164/74  (!) 167/63  ?Pulse: 70 60  71  ?Resp: (!) 24 (!) 22    ?Temp: 99 ?F (37.2 ?C) 98.7 ?F (37.1 ?C)    ?TempSrc:  Oral    ?SpO2: 95% 97% 94%   ?Weight:      ?Height:      ? ? ? ? ?Data Reviewed: ? ?Basic Metabolic Panel: ?Recent Labs  ?Lab 03/11/21 ?4462 03/15/21 ?8638 03/15/21 ?1771 03/16/21 ?1657 03/17/21 ?0755  ?NA 133* 136 137 135 135  ?K 4.8 4.3 4.4 4.2 4.8  ?CL 106 106  --  105 104  ?CO2 20* 20*  --  22 21*  ?GLUCOSE 161* 197*  --  241* 203*  ?BUN 24* 31*  --  36* 53*  ?CREATININE 1.61* 1.65*  --  1.64* 1.75*  ?CALCIUM 9.3 9.3  --  9.6 9.5   ? ? ?CBC: ?Recent Labs  ?Lab 03/11/21 ?9038 03/15/21 ?3338 03/15/21 ?3291 03/16/21 ?9166 03/17/21 ?0755  ?WBC 13.3* 14.4*  --  13.5* 19.3*  ?NEUTROABS 8.5* 9.4*  --   --   --   ?HGB 13.3 12.8 12.9 12.9 13.3  ?HCT 41.4 39.5 38.0 40.8 42.7  ?MCV 88.1 88.6  --  89.5 90.5  ?PLT 324 283  --  271 306  ? ? ?LFT ?Recent Labs  ?Lab 03/11/21 ?0600 03/15/21 ?4599 03/16/21 ?7741  ?AST 16 17 17   ?ALT 13 11 13   ?ALKPHOS 79 81 77  ?BILITOT 0.5 0.6 0.3  ?PROT 7.0 7.0 6.7  ?ALBUMIN 3.7 3.5 3.5  ? ?  ?Antibiotics: ?Anti-infectives (From admission, onward)  ? ? Start     Dose/Rate Route Frequency Ordered Stop  ? 03/15/21 1633  doxycycline (VIBRA-TABS) tablet 100 mg       ? 100 mg Oral 2 times daily 03/15/21 1613    ? 03/15/21 0615  doxycycline (VIBRA-TABS) tablet 100 mg       ? 100 mg Oral  Once 03/15/21 4239 03/15/21 0616  ? ?  ? ? ? ?DVT prophylaxis: Apixaban ? ?Code Status: Full code ? ?Family Communication: Discussed with patient's son at bedside ? ? ?CONSULTS  ? ? ?Objective  ? ? ?Physical Examination: ? ? ?General-appears in no acute distress ?Heart-S1-S2, regular, no murmur auscultated ?Lungs-bilateral wheezing auscultated ?Abdomen-soft, nontender, no organomegaly ?Extremities-no edema in the lower extremities ?Neuro-alert, oriented x3, no focal deficit noted ? ?Status is: Inpatient:   ? ? ? ?  ? ? ?Oswald Hillock ?  ?[facility] ?If 7PM-7AM, please contact night-coverage at [email or web address], ?Office  [phone] ? ? ?03/17/2021, 11:52 AM  LOS: 1 day  ? ? ? ? ? ? ? ? ? ? ?  ?

## 2021-03-18 LAB — BASIC METABOLIC PANEL
Anion gap: 10 (ref 5–15)
BUN: 63 mg/dL — ABNORMAL HIGH (ref 8–23)
CO2: 20 mmol/L — ABNORMAL LOW (ref 22–32)
Calcium: 9.3 mg/dL (ref 8.9–10.3)
Chloride: 102 mmol/L (ref 98–111)
Creatinine, Ser: 1.65 mg/dL — ABNORMAL HIGH (ref 0.44–1.00)
GFR, Estimated: 32 mL/min — ABNORMAL LOW (ref >60)
Glucose, Bld: 210 mg/dL — ABNORMAL HIGH (ref 70–99)
Potassium: 4.5 mmol/L (ref 3.5–5.1)
Sodium: 132 mmol/L — ABNORMAL LOW (ref 135–145)

## 2021-03-18 LAB — CBC
HCT: 42.9 % (ref 36.0–46.0)
Hemoglobin: 13.4 g/dL (ref 12.0–15.0)
MCH: 28.1 pg (ref 26.0–34.0)
MCHC: 31.2 g/dL (ref 30.0–36.0)
MCV: 89.9 fL (ref 80.0–100.0)
Platelets: 319 10*3/uL (ref 150–400)
RBC: 4.77 MIL/uL (ref 3.87–5.11)
RDW: 15.5 % (ref 11.5–15.5)
WBC: 19.6 10*3/uL — ABNORMAL HIGH (ref 4.0–10.5)
nRBC: 0 % (ref 0.0–0.2)

## 2021-03-18 LAB — GLUCOSE, CAPILLARY
Glucose-Capillary: 219 mg/dL — ABNORMAL HIGH (ref 70–99)
Glucose-Capillary: 243 mg/dL — ABNORMAL HIGH (ref 70–99)

## 2021-03-18 MED ORDER — IPRATROPIUM-ALBUTEROL 0.5-2.5 (3) MG/3ML IN SOLN: 3.0000 mL | Freq: Four times a day (QID) | RESPIRATORY_TRACT | 1 refills | Status: DC | PRN

## 2021-03-18 MED ORDER — GUAIFENESIN ER 600 MG PO TB12: 1200.0000 mg | ORAL_TABLET | Freq: Two times a day (BID) | ORAL | 0 refills | Status: DC

## 2021-03-18 MED ORDER — PREDNISONE 10 MG PO TABS: ORAL_TABLET | ORAL | 0 refills | Status: DC

## 2021-03-18 MED ORDER — ORAL CARE MOUTH RINSE
15.0000 mL | Freq: Two times a day (BID) | OROMUCOSAL | Status: DC
  Administered 2021-03-18: 15 mL via OROMUCOSAL

## 2021-03-18 MED ORDER — IPRATROPIUM-ALBUTEROL 0.5-2.5 (3) MG/3ML IN SOLN: 3.0000 mL | Freq: Two times a day (BID) | RESPIRATORY_TRACT | Status: DC

## 2021-03-18 NOTE — Plan of Care (Signed)

## 2021-03-18 NOTE — Plan of Care (Signed)
?  Problem: Clinical Measurements: ?Goal: Will remain free from infection ?Outcome: Progressing ?Goal: Diagnostic test results will improve ?Outcome: Progressing ?Goal: Respiratory complications will improve ?Outcome: Progressing ?  ?Problem: Coping: ?Goal: Level of anxiety will decrease ?Outcome: Progressing ?  ?

## 2021-03-18 NOTE — Progress Notes (Signed)
Made on call provider aware that patient's BP this morning was 170/83 after rechecking the BP from the 161/80. Also, notified on call provider to get clarification from the PRN hydralazine order, but did not get a response. Passed along to dayshift nurse.  ?

## 2021-03-18 NOTE — Progress Notes (Signed)
Notified on call provider about patient needing something to help the patient sleep. On call provider gave a one time order for hydroxyzine 25 mg tab oral. On call provider had also given a one time dose for melatonin 5 mg tab oral, but patient's caregiver told nurse that melatonin does not work for the patient. ?

## 2021-03-18 NOTE — Discharge Summary (Signed)
Physician Discharge Summary   Patient: Abigail Wallace MRN: [id number] DOB: April 06, 1945  Admit date:     03/15/2021  Discharge date: 03/18/21  Discharge Physician: Oswald Hillock   PCP: Martyn Malay, MD   Recommendations at discharge:   Follow-up PCP in 2 weeks  Discharge Diagnoses: Principal Problem:   Acute exacerbation of CHF (congestive heart failure) (HCC) Active Problems:   COPD exacerbation (HCC)  Resolved Problems:   * No resolved hospital problems. *   Hospital Course:  Abigail Wallace  is a 76 y.o. female,With history of CKD stage IIIb, hypertension, atrial fibrillation, on chronic anticoagulation with Eliquis, permanent pacemaker for complete heart block, diabetes mellitus type 2 presented to ED for worsening shortness of breath.  Patient said that she has been getting short of breath since Sunday. Denies any chest pain.  In the ED she was found to be hypoxemic, she was started on 2 L of oxygen via nasal cannula.  Chest x-ray showed possible CHF.  Patient was given 1 dose of Lasix 20 mg IV.  She also received 1 dose of Solu-Medrol 125 mg IV. she also has been taking doxycycline which was prescribed as outpatient.  Assessment and Plan:  COPD exacerbation -Presented with worsening shortness of breath -Improved -Started on Solu-Medrol 40 mg IV every 12 hours, doxycycline 100 mg every 12 hours, DuoNeb scheduled every 6 hours -Solu-Medrol was discontinued and patient started on his own 40 mg daily -We will discharge patient on prednisone taper for 5 days -Continue Mucinex 1200 mg p.o. twice daily for 4 more days    Acute hypoxemic respiratory failure -Secondary to COPD exacerbation -Patient required 2 L/min of oxygen ; oxygen is currently weaned off       Acute on chronic systolic CHF -Euvolemic, Lasix discontinued   CKD stage IIIb -Creatinine at baseline   History of atrial fibrillation -Heart rate is controlled -Continue metoprolol,  -continue anticoagulation  with Eliquis   Hypertension -Blood pressure is mildly elevated -Continue amlodipine, Benicar, metoprolol   Constipation -Resolved   Diabetes mellitus type 2 -Continue home regimen   Leukocytosis -WBC 19,000 -Secondary to steroids          Consultants:  Procedures performed:  Disposition: Home Diet recommendation:  Carb modified diet  DISCHARGE MEDICATION: Allergies as of 03/18/2021       Reactions   Abilify [aripiprazole] Anaphylaxis, Shortness Of Breath, Other (See Comments)   Tremors   Clozapine Anaphylaxis   Benadryl [diphenhydramine Hcl] Other (See Comments)   States affects her vision   Cogentin [benztropine] Other (See Comments)   Affects mobility   Haloperidol Lactate Other (See Comments)   Vision changes, Shaking, Drooling   Latuda [lurasidone Hcl] Other (See Comments)   "Tremors and shakes."    Lorazepam    Other reaction(s): Unknown   Remeron [mirtazapine] Other (See Comments)   Tremors and shakes   Keflex [cephalexin]    Per patient, has tongue swelling with keflex but can tolerate amoxicillin   Codeine Other (See Comments)   Unknown    Latex Rash        Medication List     STOP taking these medications    doxycycline 100 MG capsule Commonly known as: VIBRAMYCIN       TAKE these medications    acetaminophen 500 MG tablet Commonly known as: TYLENOL Take 1,000 mg by mouth every 6 (six) hours as needed for moderate pain.   albuterol 108 (90 Base) MCG/ACT inhaler Commonly known as:  VENTOLIN HFA Inhale 2 puffs into the lungs every 6 (six) hours as needed for wheezing or shortness of breath. What changed: Another medication with the same name was removed. Continue taking this medication, and follow the directions you see here.   allopurinol 100 MG tablet Commonly known as: ZYLOPRIM Take 1 tablet (100 mg total) by mouth daily.   amLODipine 10 MG tablet Commonly known as: NORVASC Take 10 mg by mouth daily. What changed: Another  medication with the same name was removed. Continue taking this medication, and follow the directions you see here.   Anoro Ellipta 62.5-25 MCG/ACT Aepb Generic drug: umeclidinium-vilanterol Inhale 1 puff into the lungs daily.   cholecalciferol 25 MCG (1000 UNIT) tablet Commonly known as: VITAMIN D3 Take 1 tablet (1,000 Units total) by mouth daily.   diclofenac Sodium 1 % Gel Commonly known as: Voltaren Apply 2 g topically 4 (four) times daily as needed (pain).   divalproex 250 MG 24 hr tablet Commonly known as: DEPAKOTE ER Take 250 mg by mouth at bedtime.   docusate sodium 100 MG capsule Commonly known as: Colace Take 1 capsule (100 mg total) by mouth 2 (two) times daily. What changed:  when to take this reasons to take this   Eliquis 5 MG Tabs tablet Generic drug: apixaban Take 1 tablet (5 mg total) by mouth 2 (two) times daily.   glucose blood test strip Test each morning before breakfast   OneTouch Verio test strip Generic drug: glucose blood Please use to check blood sugar once daily. E11.9   guaiFENesin 600 MG 12 hr tablet Commonly known as: MUCINEX Take 2 tablets (1,200 mg total) by mouth 2 (two) times daily.   hydrocortisone cream 0.5 % Apply 1 application topically 2 (two) times daily. Apply WITH Nystatin under left breast What changed:  when to take this reasons to take this   Incontinence Supplies Misc 1 Units by Does not apply route as needed.   ipratropium-albuterol 0.5-2.5 (3) MG/3ML Soln Commonly known as: DUONEB Take 3 mLs by nebulization every 6 (six) hours as needed.   Lancet Device Misc 1 Device by Does not apply route 3 (three) times a week.   linagliptin 5 MG Tabs tablet Commonly known as: TRADJENTA Take 1 tablet (5 mg total) by mouth daily.   metoprolol tartrate 50 MG tablet Commonly known as: LOPRESSOR Take 2 tablets (100 mg total) by mouth 2 (two) times daily. What changed:  how much to take when to take this additional  instructions   nitroGLYCERIN 0.4 MG SL tablet Commonly known as: NITROSTAT PLACE ONE TABLET UNDER THE TONGUE EVERY FIVE MINUTES FOR THREE DOSES AS NEEDED FOR CHEST PAIN. CALL 911 AFTER THAT What changed: See the new instructions.   nystatin cream Commonly known as: MYCOSTATIN Apply 1 application topically 2 (two) times daily. What changed:  when to take this reasons to take this   OLANZapine 10 MG tablet Commonly known as: ZYPREXA Take 10 mg by mouth at bedtime.   olmesartan 5 MG tablet Commonly known as: BENICAR Take 5 mg by mouth every evening.   OneTouch Delica Lancets 45Y Misc Use to test once daily.  DX code:E11.9   OneTouch Verio w/Device Kit Check blood sugar once daily   pravastatin 40 MG tablet Commonly known as: PRAVACHOL TAKE ONE TABLET BY MOUTH EVERY EVENING   predniSONE 10 MG tablet Commonly known as: DELTASONE Prednisone 40 mg po daily x 1 day then Prednisone 30 mg po daily x 1 day then  Prednisone 20 mg po daily x 1 day then Prednisone 10 mg daily x 1 day then stop...   senna 8.6 MG Tabs tablet Commonly known as: SENOKOT Take 1 tablet (8.6 mg total) by mouth daily as needed for mild constipation.   sodium bicarbonate 650 MG tablet Take 1 tablet (650 mg total) by mouth 2 (two) times daily.         Discharge Exam: Filed Weights   03/15/21 0505 03/15/21 1542  Weight: 123 kg 123 kg   General-appears in no acute distress Heart-S1-S2, regular, no murmur auscultated Lungs-clear to auscultation bilaterally, no wheezing or crackles auscultated Abdomen-soft, nontender, no organomegaly Extremities-no edema in the lower extremities Neuro-alert, oriented x3, no focal deficit noted  Condition at discharge: good  The results of significant diagnostics from this hospitalization (including imaging, microbiology, ancillary and laboratory) are listed below for reference.   Imaging Studies: DG Ankle Complete Right  Result Date: 03/11/2021 CLINICAL DATA:   76 year old female with history of bruising and swelling in the right foot and ankle. EXAM: RIGHT FOOT COMPLETE - 3+ VIEW; RIGHT ANKLE - COMPLETE 3+ VIEW COMPARISON:  No priors. FINDINGS: Multiple views of the right foot and right ankle demonstrate no acute displaced fracture or dislocation. Mild osteopenia. There is multifocal joint space narrowing, subchondral sclerosis and osteophyte formation, most pronounced at the first MTP joint, indicative of osteoarthritis. Soft tissues are diffusely swollen. IMPRESSION: 1. Diffuse soft tissue swelling around the right foot and ankle. 2. No acute displaced fracture. 3. Degenerative changes of osteoarthritis, most severe at the first MTP joint. Electronically Signed   By: Vinnie Langton M.D.   On: 03/11/2021 07:45   US Venous Img Lower Bilateral  Result Date: 03/15/2021 CLINICAL DATA:  Bilateral lower extremity edema. Shortness of breath. Evaluate for DVT. EXAM: BILATERAL LOWER EXTREMITY VENOUS DOPPLER ULTRASOUND TECHNIQUE: Gray-scale sonography with graded compression, as well as color Doppler and duplex ultrasound were performed to evaluate the lower extremity deep venous systems from the level of the common femoral vein and including the common femoral, femoral, profunda femoral, popliteal and calf veins including the posterior tibial, peroneal and gastrocnemius veins when visible. The superficial great saphenous vein was also interrogated. Spectral Doppler was utilized to evaluate flow at rest and with distal augmentation maneuvers in the common femoral, femoral and popliteal veins. COMPARISON:  None. FINDINGS: Examination degraded due to patient body habitus and poor sonographic window. RIGHT LOWER EXTREMITY Common Femoral Vein: No evidence of thrombus. Normal compressibility, respiratory phasicity and response to augmentation. Saphenofemoral Junction: No evidence of thrombus. Normal compressibility and flow on color Doppler imaging. Profunda Femoral Vein: No  evidence of thrombus. Normal compressibility and flow on color Doppler imaging. Femoral Vein: No evidence of thrombus. Normal compressibility, respiratory phasicity and response to augmentation. Popliteal Vein: No evidence of thrombus. Normal compressibility, respiratory phasicity and response to augmentation. Calf Veins: Suboptimally visualized. Superficial Great Saphenous Vein: No evidence of thrombus. Normal compressibility and flow on color Doppler imaging Other Findings:  None. LEFT LOWER EXTREMITY Common Femoral Vein: No evidence of thrombus. Normal compressibility, respiratory phasicity and response to augmentation. Saphenofemoral Junction: No evidence of thrombus. Normal compressibility and flow on color Doppler imaging. Profunda Femoral Vein: No evidence of thrombus. Normal compressibility and flow on color Doppler imaging. Femoral Vein: No evidence of thrombus. Normal compressibility, respiratory phasicity and response to augmentation. Popliteal Vein: No evidence of thrombus. Normal compressibility, respiratory phasicity and response to augmentation. Calf Veins: Suboptimally visualized Superficial Great Saphenous Vein: No evidence of  thrombus. Normal compressibility. Other Findings:  None. IMPRESSION: No evidence of DVT within either lower extremity. Electronically Signed   By: Sandi Mariscal M.D.   On: 03/15/2021 08:43   DG CHEST PORT 1 VIEW  Result Date: 03/16/2021 CLINICAL DATA:  76 year old female with shortness of breath. EXAM: PORTABLE CHEST 1 VIEW COMPARISON:  Portable chest 03/15/2021 and earlier. FINDINGS: Portable AP semi upright view at 0640 hours. Improved lung volumes and bibasilar ventilation since yesterday. Stable cardiomegaly and mediastinal contours. Stable left chest cardiac pacemaker. No pneumothorax, pleural effusion or consolidation. Pulmonary vascular congestion without overt edema. Visualized tracheal air column is within normal limits. No acute osseous abnormality identified.  IMPRESSION: 1. Improved lung volumes and ventilation since yesterday. 2. Cardiomegaly and vascular congestion without overt edema. Electronically Signed   By: Genevie Ann M.D.   On: 03/16/2021 07:14   DG Chest Port 1 View  Result Date: 03/15/2021 CLINICAL DATA:  Shortness of breath. EXAM: PORTABLE CHEST 1 VIEW COMPARISON:  06/07/2020. FINDINGS: 0544 hours. The cardio pericardial silhouette is enlarged. There is pulmonary vascular congestion without overt pulmonary edema. Diffuse interstitial opacity suggests edema. No focal airspace consolidation. No substantial pleural effusion. Bones are diffusely demineralized. IMPRESSION: Findings compatible with CHF. Electronically Signed   By: Misty Stanley M.D.   On: 03/15/2021 07:03   DG Foot Complete Right  Result Date: 03/11/2021 CLINICAL DATA:  76 year old female with history of bruising and swelling in the right foot and ankle. EXAM: RIGHT FOOT COMPLETE - 3+ VIEW; RIGHT ANKLE - COMPLETE 3+ VIEW COMPARISON:  No priors. FINDINGS: Multiple views of the right foot and right ankle demonstrate no acute displaced fracture or dislocation. Mild osteopenia. There is multifocal joint space narrowing, subchondral sclerosis and osteophyte formation, most pronounced at the first MTP joint, indicative of osteoarthritis. Soft tissues are diffusely swollen. IMPRESSION: 1. Diffuse soft tissue swelling around the right foot and ankle. 2. No acute displaced fracture. 3. Degenerative changes of osteoarthritis, most severe at the first MTP joint. Electronically Signed   By: Vinnie Langton M.D.   On: 03/11/2021 07:45   CUP PACEART REMOTE DEVICE CHECK  Result Date: 02/17/2021 Scheduled remote reviewed. Normal device function.  Next remote 91 days. [place]   Microbiology: Results for orders placed or performed during the hospital encounter of 03/15/21  Resp Panel by RT-PCR (Flu A&B, Covid) Nasopharyngeal Swab     Status: None   Collection Time: 03/15/21  5:36 AM   Specimen:  Nasopharyngeal Swab; Nasopharyngeal(NP) swabs in vial transport medium  Result Value Ref Range Status   SARS Coronavirus 2 by RT PCR NEGATIVE NEGATIVE Final    Comment: (NOTE) SARS-CoV-2 target nucleic acids are NOT DETECTED.  The SARS-CoV-2 RNA is generally detectable in upper respiratory specimens during the acute phase of infection. The lowest concentration of SARS-CoV-2 viral copies this assay can detect is 138 copies/mL. A negative result does not preclude SARS-Cov-2 infection and should not be used as the sole basis for treatment or other patient management decisions. A negative result may occur with  improper specimen collection/handling, submission of specimen other than nasopharyngeal swab, presence of viral mutation(s) within the areas targeted by this assay, and inadequate number of viral copies(<138 copies/mL). A negative result must be combined with clinical observations, patient history, and epidemiological information. The expected result is Negative.  Fact Sheet for Patients:  EntrepreneurPulse.com.au  Fact Sheet for Healthcare Providers:  IncredibleEmployment.be  This test is no t yet approved or cleared by the [place] and  has been authorized for detection and/or diagnosis of SARS-CoV-2 by FDA under an Emergency Use Authorization (EUA). This EUA will remain  in effect (meaning this test can be used) for the duration of the COVID-19 declaration under Section 564(b)(1) of the Act, 21 U.S.C.section 360bbb-3(b)(1), unless the authorization is terminated  or revoked sooner.       Influenza A by PCR NEGATIVE NEGATIVE Final   Influenza B by PCR NEGATIVE NEGATIVE Final    Comment: (NOTE) The Xpert Xpress SARS-CoV-2/FLU/RSV plus assay is intended as an aid in the diagnosis of influenza from Nasopharyngeal swab specimens and should not be used as a sole basis for treatment. Nasal washings and aspirates are unacceptable for  Xpert Xpress SARS-CoV-2/FLU/RSV testing.  Fact Sheet for Patients: EntrepreneurPulse.com.au  Fact Sheet for Healthcare Providers: IncredibleEmployment.be  This test is not yet approved or cleared by the [place] FDA and has been authorized for detection and/or diagnosis of SARS-CoV-2 by FDA under an Emergency Use Authorization (EUA). This EUA will remain in effect (meaning this test can be used) for the duration of the COVID-19 declaration under Section 564(b)(1) of the Act, 21 U.S.C. section 360bbb-3(b)(1), unless the authorization is terminated or revoked.  Performed at [facility], [address] [place], [place] [address]    *Note: Due to a large number of results and/or encounters for the requested time period, some results have not been displayed. A complete set of results can be found in Results Review.    Labs: CBC: Recent Labs  Lab 03/15/21 0525 03/15/21 0547 03/16/21 0348 03/17/21 0755 03/18/21 0332  WBC 14.4*  --  13.5* 19.3* 19.6*  NEUTROABS 9.4*  --   --   --   --   HGB 12.8 12.9 12.9 13.3 13.4  HCT 39.5 38.0 40.8 42.7 42.9  MCV 88.6  --  89.5 90.5 89.9  PLT 283  --  271 306 025   Basic Metabolic Panel: Recent Labs  Lab 03/15/21 0525 03/15/21 0547 03/16/21 0348 03/17/21 0755 03/18/21 0332  NA 136 137 135 135 132*  K 4.3 4.4 4.2 4.8 4.5  CL 106  --  105 104 102  CO2 20*  --  22 21* 20*  GLUCOSE 197*  --  241* 203* 210*  BUN 31*  --  36* 53* 63*  CREATININE 1.65*  --  1.64* 1.75* 1.65*  CALCIUM 9.3  --  9.6 9.5 9.3   Liver Function Tests: Recent Labs  Lab 03/15/21 0525 03/16/21 0348  AST 17 17  ALT 11 13  ALKPHOS 81 77  BILITOT 0.6 0.3  PROT 7.0 6.7  ALBUMIN 3.5 3.5   CBG: Recent Labs  Lab 03/17/21 1120 03/17/21 1613 03/17/21 2210 03/18/21 0719 03/18/21 1111  GLUCAP 211* 212* 229* 219* 243*    Discharge time spent: greater than 30 minutes.  Signed: Oswald Hillock, MD [facility] 03/18/2021

## 2021-03-20 ENCOUNTER — Ambulatory Visit: Payer: Medicare Other | Admitting: Podiatry

## 2021-03-20 ENCOUNTER — Telehealth: Payer: Self-pay | Admitting: Family Medicine

## 2021-03-20 DIAGNOSIS — F17200 Nicotine dependence, unspecified, uncomplicated: Secondary | ICD-10-CM

## 2021-03-20 MED ORDER — NICOTINE POLACRILEX 2 MG MT LOZG: 2.0000 mg | LOZENGE | OROMUCOSAL | 0 refills | Status: DC | PRN

## 2021-03-20 NOTE — Telephone Encounter (Signed)
Called patient to discuss hospital stay. Blood pressure is 120s to 130s.  Morning blood sugars 1 11-1 20.  She did get up as high as the high 200s to low 300s once after eating a sweet.  Son is cut out all sweets while she is on the steroid.  Patient is also given up cigarettes.  She continues to have some anxiety and cravings for cigarettes.  Lozenge sent to pharmacy. ? ?All questions answered.  Reviewed reasons to call and return to care. ? ?[name], MD  ?Family Medicine Teaching Service  ? ?

## 2021-03-24 ENCOUNTER — Other Ambulatory Visit: Payer: Self-pay

## 2021-03-24 DIAGNOSIS — L304 Erythema intertrigo: Secondary | ICD-10-CM

## 2021-03-24 DIAGNOSIS — L739 Follicular disorder, unspecified: Secondary | ICD-10-CM

## 2021-03-24 MED ORDER — MUPIROCIN CALCIUM 2 % EX CREA: 1.0000 "application " | TOPICAL_CREAM | Freq: Two times a day (BID) | CUTANEOUS | 3 refills | Status: DC

## 2021-03-24 MED ORDER — NYSTATIN 100000 UNIT/GM EX CREA: 1.0000 "application " | TOPICAL_CREAM | Freq: Two times a day (BID) | CUTANEOUS | 0 refills | Status: DC

## 2021-03-24 NOTE — Telephone Encounter (Signed)
Refilled both requested.  ?

## 2021-03-24 NOTE — Telephone Encounter (Signed)
Patient calls nurse line requesting refills on nystatin cream and mupirocin ointment. Mupirocin ointment is not on current medication list. Please advise.  ? ?Talbot Grumbling, RN ? ?

## 2021-03-27 ENCOUNTER — Telehealth: Payer: Self-pay | Admitting: Cardiovascular Disease

## 2021-03-27 ENCOUNTER — Other Ambulatory Visit: Payer: Self-pay

## 2021-03-27 DIAGNOSIS — I10 Essential (primary) hypertension: Secondary | ICD-10-CM

## 2021-03-27 MED ORDER — METOPROLOL TARTRATE 50 MG PO TABS: 50.0000 mg | ORAL_TABLET | Freq: Two times a day (BID) | ORAL | 3 refills | Status: DC

## 2021-03-27 NOTE — Telephone Encounter (Signed)
When I called son to inform him of the amlodipine order, he said, "I forgot to tell you, She is not taking amlodipine." Also, son stated that pt was not discharged from hospital on furosemide. ?

## 2021-03-27 NOTE — Telephone Encounter (Signed)
Okay, then.  Lets cut back the metoprolol.  If our notes are accurate she is taking 50 mg in the morning and 100 mg at night (I saw your notes reported that she is taking 200 mg daily).  Please cut down to metoprolol tartrate 50 mg twice daily. ?

## 2021-03-27 NOTE — Telephone Encounter (Signed)
Pt c/o BP issue: STAT if pt c/o blurred vision, one-sided weakness or slurred speech ? ?1. What are your last 5 BP readings?  ?03/27/21: 120/50 ?03/26/21: 123/62, 120/49, 103/48, 144/64 ?03/25/21: 121/50 ? ?2. Are you having any other symptoms (ex. Dizziness, headache, blurred vision, passed out)? Weak following hospital d/c  ? ?3. What is your BP issue? Son is concerned about BP readings.  ? ?Son is using wrist monitor  ?

## 2021-03-27 NOTE — Telephone Encounter (Signed)
Spoke with son and informed him that patient needs to take metoprolol tartrate 50 mg twice daily. Prescription sent to pharmacy. ?

## 2021-03-27 NOTE — Telephone Encounter (Signed)
Please reduce the amlodipine to 5 mg daily (half of the 10 mg tablet once daily).  It may take a few days for the change to have its full impact. ?Can you please ask what dose of furosemide she was discharged on from the hospital? ?

## 2021-03-27 NOTE — Telephone Encounter (Signed)
Son is concerned because diastolic BP is in the 66Q - 50s and th patient is "sluggish since getting out of hospital recently." PCP referred pt to cardiology. Son has not spoken with nephrologist yet. Patient on amlodipine 10 mg daily and metoprolol tartrate 200 daily. Finger BS this am 168. Please advise. ?

## 2021-03-30 ENCOUNTER — Telehealth: Payer: Self-pay | Admitting: Cardiovascular Disease

## 2021-03-30 NOTE — Telephone Encounter (Signed)
Spoke with patient and she wanted to know if sooner appointment than 4/3  ?Advised patient Dr Sallyanne Kuster not in office next week ?Patient verbalized understanding  ? ?

## 2021-03-30 NOTE — Telephone Encounter (Signed)
Patient is requesting to speak with Lattie Haw, RN. She declined going into detail with me - states she will discuss further when she speaks with the nurse. ?

## 2021-04-03 ENCOUNTER — Ambulatory Visit (INDEPENDENT_AMBULATORY_CARE_PROVIDER_SITE_OTHER): Payer: Medicare Other

## 2021-04-03 DIAGNOSIS — I495 Sick sinus syndrome: Secondary | ICD-10-CM | POA: Diagnosis not present

## 2021-04-03 DIAGNOSIS — M1990 Unspecified osteoarthritis, unspecified site: Secondary | ICD-10-CM | POA: Diagnosis not present

## 2021-04-04 ENCOUNTER — Encounter: Payer: Self-pay | Admitting: Family Medicine

## 2021-04-05 LAB — CUP PACEART REMOTE DEVICE CHECK
Battery Impedance: 823 Ohm
Battery Remaining Longevity: 61 mo
Battery Voltage: 2.77 V
Brady Statistic AP VP Percent: 82 %
Brady Statistic AP VS Percent: 0 %
Brady Statistic AS VP Percent: 18 %
Brady Statistic AS VS Percent: 0 %
Date Time Interrogation Session: 20230321122513
Implantable Lead Implant Date: 20070414
Implantable Lead Implant Date: 20070914
Implantable Lead Location: 753859
Implantable Lead Location: 753860
Implantable Lead Model: 4092
Implantable Lead Model: 5594
Implantable Pulse Generator Implant Date: 20160816
Lead Channel Impedance Value: 593 Ohm
Lead Channel Impedance Value: 596 Ohm
Lead Channel Pacing Threshold Amplitude: 0.625 V
Lead Channel Pacing Threshold Amplitude: 1.125 V
Lead Channel Pacing Threshold Pulse Width: 0.4 ms
Lead Channel Pacing Threshold Pulse Width: 0.4 ms
Lead Channel Setting Pacing Amplitude: 2 V
Lead Channel Setting Pacing Amplitude: 2.5 V
Lead Channel Setting Pacing Pulse Width: 0.4 ms
Lead Channel Setting Sensing Sensitivity: 4 mV

## 2021-04-10 ENCOUNTER — Ambulatory Visit (INDEPENDENT_AMBULATORY_CARE_PROVIDER_SITE_OTHER): Payer: Medicare Other | Admitting: Family Medicine

## 2021-04-10 ENCOUNTER — Encounter: Payer: Self-pay | Admitting: Family Medicine

## 2021-04-10 ENCOUNTER — Other Ambulatory Visit: Payer: Self-pay

## 2021-04-10 ENCOUNTER — Telehealth: Payer: Self-pay

## 2021-04-10 ENCOUNTER — Encounter: Payer: Self-pay | Admitting: Podiatry

## 2021-04-10 ENCOUNTER — Ambulatory Visit (INDEPENDENT_AMBULATORY_CARE_PROVIDER_SITE_OTHER): Payer: Medicare Other | Admitting: Podiatry

## 2021-04-10 VITALS — BP 152/124 | HR 76

## 2021-04-10 DIAGNOSIS — E119 Type 2 diabetes mellitus without complications: Secondary | ICD-10-CM | POA: Diagnosis not present

## 2021-04-10 DIAGNOSIS — R918 Other nonspecific abnormal finding of lung field: Secondary | ICD-10-CM

## 2021-04-10 DIAGNOSIS — J42 Unspecified chronic bronchitis: Secondary | ICD-10-CM

## 2021-04-10 DIAGNOSIS — I10 Essential (primary) hypertension: Secondary | ICD-10-CM | POA: Diagnosis not present

## 2021-04-10 DIAGNOSIS — B351 Tinea unguium: Secondary | ICD-10-CM

## 2021-04-10 DIAGNOSIS — F17200 Nicotine dependence, unspecified, uncomplicated: Secondary | ICD-10-CM | POA: Diagnosis not present

## 2021-04-10 DIAGNOSIS — M79674 Pain in right toe(s): Secondary | ICD-10-CM | POA: Diagnosis not present

## 2021-04-10 DIAGNOSIS — B354 Tinea corporis: Secondary | ICD-10-CM | POA: Diagnosis not present

## 2021-04-10 DIAGNOSIS — M79675 Pain in left toe(s): Secondary | ICD-10-CM

## 2021-04-10 DIAGNOSIS — E1169 Type 2 diabetes mellitus with other specified complication: Secondary | ICD-10-CM

## 2021-04-10 DIAGNOSIS — N1832 Chronic kidney disease, stage 3b: Secondary | ICD-10-CM

## 2021-04-10 MED ORDER — KETOCONAZOLE 2 % EX CREA: 1.0000 "application " | TOPICAL_CREAM | Freq: Every day | CUTANEOUS | 1 refills | Status: DC

## 2021-04-10 NOTE — Assessment & Plan Note (Signed)
Restarted amlodipine.  Reminder to self to call in 2 weeks time.  Son to monitor blood pressures if continues to be elevated increase start beta-blocker ?

## 2021-04-10 NOTE — Progress Notes (Signed)
This patient returns to my office for at risk foot care.  This patient requires this care by a professional since this patient will be at risk due to having  CKD.  This patient is unable to cut nails herself since the patient cannot reach her nails.These nails are painful walking and wearing shoes.  This patient presents for at risk foot care today. ? ?General Appearance  Alert, conversant and in no acute stress. ? ?Vascular  Dorsalis pedis and posterior tibial  pulses are palpable  bilaterally.  Capillary return is within normal limits  bilaterally. Temperature is within normal limits  bilaterally. ? ?Neurologic  Senn-Weinstein monofilament wire test absent  bilaterally. Muscle power within normal limits bilaterally. ? ?Nails Thick disfigured discolored nails with subungual debris  from hallux to fifth toes bilaterally. No evidence of bacterial infection or drainage bilaterally. Pincer right hallux nail. ? ?Orthopedic  No limitations of motion  feet .  No crepitus or effusions noted.  No bony pathology or digital deformities noted. ? ?Skin  normotropic skin with no porokeratosis noted bilaterally.  No signs of infections or ulcers noted.    ? ?Onychomycosis  Pain in right toes  Pain in left toes ? ?Consent was obtained for treatment procedures.   Mechanical debridement of nails 1-5  bilaterally performed with a nail nipper.  Filed with dremel without incident.  ? ? ?Return office visit     prn                Told patient to return for periodic foot care and evaluation due to potential at risk complications. ? ? ?Gardiner Barefoot DPM   ?

## 2021-04-10 NOTE — Progress Notes (Signed)
? ? ?SUBJECTIVE:  ? ?CHIEF COMPLAINT: hospital follow up  ?HPI:  ? ?Abigail Wallace is a 76 y.o.  with history notable for anxiety, atrial fibrillation on Eliquis, chronic kidney disease, type 2 diabetes that is well controlled and hypertension presenting for skin concerns and a number of other items after a hospital stay for COPD exacerbation.  Patient is joined by her son today who provides most of her care. ? ?The patient was recently admitted for acute hypoxemic respiratory failure related to COPD exacerbation.  During her hospital stay she was initially treated with IV Solu-Medrol followed by prednisone.  She completed a course of doxycycline.  She was initially on oxygen and weaned to room air.  Her breathing sounds markedly improved today.  Her cough still lingers to some degree.  She denies chest pressure or difficulty breathing.  She has not smoked since 1 March. ? ?The patient reports several skin conditions.  Her son has been putting mupirocin on an area of skin breakdown on her bottom.  She also has a new lesion on her right shoulder.  She has some erythema under her breasts as well.  The patient does not like to use lotion.  She will take a bath and then dry her skin completely generally wears cotton. ? ?The patient reports compliance with her medications.  Her blood pressure had been running in the low 50s to 48N and the diastolics after her hospital stay.  Her amlodipine was discontinued.  She denies headaches or chest pain today. ? ?The patient reports compliance with her Tradjenta.  Her blood sugars have been averaging in the 170s in the mornings.  She is very worried about her blood sugars.  She is not interested in an injectable. ?Last A1c was 6.2 which is improved from prior ?PERTINENT  PMH / PSH/Family/Social History : Updated and reviewed as appropriate.  She is a prior pulmonary nodule and needed a repeat CT in a year.  CT has been ordered. ? ?OBJECTIVE:  ? ?BP (!) 152/124   Pulse 76   SpO2  98%   ?Today's weight:  ?Review of prior weights: ?Filed Weights  ?Pleasant appearing woman in no distress.  She is somewhat anxious today and does have a baseline tremor.  Regular rate and rhythm on my exam today.  Lungs are improved with good air entry today and no wheezing on expiration.  Skin is closely examined.  On her bottom there is a healing small erythematous macule that is blanching.  On her upper right shoulder (posterior over scapula) there is a annular scaling lesion with a somewhat raised border.  Intertriginous area has a very mild erythema. ? ?ASSESSMENT/PLAN:  ? ?CKD stage IIIb ?Had slight elevation creatinine in hospital.  Repeat BMP today.  Avoid nephrotoxic agents. ? ?Tobacco use disorder ?Patient quit 3 weeks ago.  Congratulated on quitting.  Chart updated.  Discussed that some anxiety and symptoms of excess hunger can last for period of time.  Supportive listening provided. ? ?Essential hypertension ?Restarted amlodipine.  Reminder to self to call in 2 weeks time.  Son to monitor blood pressures if continues to be elevated increase start beta-blocker ? ?Well controlled type 2 diabetes mellitus ([place]) ?Fasting blood sugars remain largely less than 180.  Average is 173.  Discussed with son and patient at length.  Patient is very worried about her diabetes.  We discussed options for other diabetes medications.  Given this averages to an A1c of 7.0 I am hesitant to  add on additional therapy given her age and risk for hypoglycemia.  Monitor over next 2 to 3 weeks.  ? ?Tinea corporis, ketoconazole prescribed for right shoulder  ? ?Intertrigo, recommended nystatin.  Son doing excellent and exceptional skin care in this area. ? ?Steroid side effects/Anxiety-this seems to be worsening over the last month.  I suspect this is a lingering side effect due to steroids as well as tobacco cessation.  Discussed at length.  She has not been sleeping well.  We discussed lingering effects of not only  hospitalization but steroids as well.  Discussed options for sleep none of which are optimal given her comorbidities and age.  After shared decision-making we agreed to monitor closely and discussed with her psychiatrist should additional medications be warranted. She is worried about her blood sugars, family, and number of other items---monitor, may need additional therapy to prevent her from returning to smoking.  ? ?History of lung nodule, annual CT ordered. ? ?Healthcare maintenance declined pneumonia vaccine . ? ? ?[name], MD  ?Family Medicine Teaching Service  ?[place]  ? ? ?

## 2021-04-10 NOTE — Assessment & Plan Note (Signed)
Patient quit 3 weeks ago.  Congratulated on quitting.  Chart updated.  Discussed that some anxiety and symptoms of excess hunger can last for period of time.  Supportive listening provided. ?

## 2021-04-10 NOTE — Telephone Encounter (Signed)
[employer] calls nurse line reporting they are still waiting on CMN form for patients incontinence supplies.  ? ?[employer] reports the spoke with Jarrett Soho on 3/20 and she was placing form in providers box. ? ?I do not see any notes.  ? ?Will forward to PCP for an update.  ?

## 2021-04-10 NOTE — Patient Instructions (Signed)
It was wonderful to see you today. ? ?Please bring ALL of your medications with you to every visit.  ? ?Today we talked about: ? ?-Restarting Norvasc (amlodipine 10 mg) ? ?- Message me with blood pressures (the medication will take at least 3 days to work) ? ?Call if blood sugars over 180 in the morning ? ?I will call you with blood work ? ?Please let me know about your sleep  ? ? ?Thank you for choosing [facility].  ? ?Please call [phone] with any questions about today's appointment. ? ?Please be sure to schedule follow up at the front  desk before you leave today.  ? ?[name], MD  ?Family Medicine  ? ?

## 2021-04-10 NOTE — Assessment & Plan Note (Signed)
Had slight elevation creatinine in hospital.  Repeat BMP today.  Avoid nephrotoxic agents. ?

## 2021-04-10 NOTE — Telephone Encounter (Signed)
Form was signed and placed in to fax box this AM at 755. ?[name], MD  ?Family Medicine Teaching Service  ? ?

## 2021-04-10 NOTE — Assessment & Plan Note (Signed)
Fasting blood sugars remain largely less than 180.  Average is 173.  Discussed with son and patient at length.  Patient is very worried about her diabetes.  We discussed options for other diabetes medications.  Given this averages to an A1c of 7.0 I am hesitant to add on additional therapy given her age and risk for hypoglycemia.  Monitor over next 2 to 3 weeks. ?

## 2021-04-11 ENCOUNTER — Other Ambulatory Visit: Payer: Self-pay

## 2021-04-11 DIAGNOSIS — K5904 Chronic idiopathic constipation: Secondary | ICD-10-CM

## 2021-04-11 LAB — CBC
Hematocrit: 35.4 % (ref 34.0–46.6)
Hemoglobin: 11.5 g/dL (ref 11.1–15.9)
MCH: 28.2 pg (ref 26.6–33.0)
MCHC: 32.5 g/dL (ref 31.5–35.7)
MCV: 87 fL (ref 79–97)
Platelets: 333 10*3/uL (ref 150–450)
RBC: 4.08 x10E6/uL (ref 3.77–5.28)
RDW: 14.6 % (ref 11.7–15.4)
WBC: 11.4 10*3/uL — ABNORMAL HIGH (ref 3.4–10.8)

## 2021-04-11 LAB — COMPREHENSIVE METABOLIC PANEL
ALT: 10 IU/L (ref 0–32)
AST: 11 IU/L (ref 0–40)
Albumin/Globulin Ratio: 1.9 (ref 1.2–2.2)
Albumin: 3.8 g/dL (ref 3.7–4.7)
Alkaline Phosphatase: 61 IU/L (ref 44–121)
BUN/Creatinine Ratio: 14 (ref 12–28)
BUN: 22 mg/dL (ref 8–27)
Bilirubin Total: <0.2 mg/dL (ref 0.0–1.2)
CO2: 19 mmol/L — ABNORMAL LOW (ref 20–29)
Calcium: 9.7 mg/dL (ref 8.7–10.3)
Chloride: 107 mmol/L — ABNORMAL HIGH (ref 96–106)
Creatinine, Ser: 1.53 mg/dL — ABNORMAL HIGH (ref 0.57–1.00)
Globulin, Total: 2 g/dL (ref 1.5–4.5)
Glucose: 159 mg/dL — ABNORMAL HIGH (ref 70–99)
Potassium: 4.1 mmol/L (ref 3.5–5.2)
Sodium: 141 mmol/L (ref 134–144)
Total Protein: 5.8 g/dL — ABNORMAL LOW (ref 6.0–8.5)
eGFR: 35 mL/min/{1.73_m2} — ABNORMAL LOW (ref >59)

## 2021-04-11 MED ORDER — SENNA 8.6 MG PO TABS: 1.0000 | ORAL_TABLET | Freq: Every day | ORAL | 0 refills | Status: DC | PRN

## 2021-04-12 ENCOUNTER — Telehealth: Payer: Self-pay | Admitting: Family Medicine

## 2021-04-12 DIAGNOSIS — I951 Orthostatic hypotension: Secondary | ICD-10-CM | POA: Diagnosis not present

## 2021-04-12 DIAGNOSIS — J449 Chronic obstructive pulmonary disease, unspecified: Secondary | ICD-10-CM | POA: Diagnosis not present

## 2021-04-12 DIAGNOSIS — M179 Osteoarthritis of knee, unspecified: Secondary | ICD-10-CM | POA: Diagnosis not present

## 2021-04-12 NOTE — Telephone Encounter (Signed)
Called Ms. Turney with results--creatinine improved, white cells improved.  Also called son with results. All questions answered.  ? ?[name], MD  ?Family Medicine Teaching Service  ? ?

## 2021-04-17 ENCOUNTER — Ambulatory Visit (INDEPENDENT_AMBULATORY_CARE_PROVIDER_SITE_OTHER): Payer: Medicare Other | Admitting: Cardiovascular Disease

## 2021-04-17 ENCOUNTER — Encounter: Payer: Self-pay | Admitting: Cardiovascular Disease

## 2021-04-17 VITALS — BP 138/62 | HR 63 | Ht 66.0 in | Wt 264.0 lb

## 2021-04-17 DIAGNOSIS — E782 Mixed hyperlipidemia: Secondary | ICD-10-CM | POA: Diagnosis not present

## 2021-04-17 DIAGNOSIS — I7143 Infrarenal abdominal aortic aneurysm, without rupture: Secondary | ICD-10-CM

## 2021-04-17 DIAGNOSIS — D6869 Other thrombophilia: Secondary | ICD-10-CM

## 2021-04-17 DIAGNOSIS — Z950 Presence of cardiac pacemaker: Secondary | ICD-10-CM | POA: Diagnosis not present

## 2021-04-17 DIAGNOSIS — E119 Type 2 diabetes mellitus without complications: Secondary | ICD-10-CM

## 2021-04-17 DIAGNOSIS — J441 Chronic obstructive pulmonary disease with (acute) exacerbation: Secondary | ICD-10-CM

## 2021-04-17 DIAGNOSIS — I10 Essential (primary) hypertension: Secondary | ICD-10-CM | POA: Diagnosis not present

## 2021-04-17 DIAGNOSIS — I5033 Acute on chronic diastolic (congestive) heart failure: Secondary | ICD-10-CM | POA: Diagnosis not present

## 2021-04-17 DIAGNOSIS — I495 Sick sinus syndrome: Secondary | ICD-10-CM

## 2021-04-17 DIAGNOSIS — I442 Atrioventricular block, complete: Secondary | ICD-10-CM

## 2021-04-17 DIAGNOSIS — I4819 Other persistent atrial fibrillation: Secondary | ICD-10-CM | POA: Diagnosis not present

## 2021-04-17 DIAGNOSIS — G4733 Obstructive sleep apnea (adult) (pediatric): Secondary | ICD-10-CM

## 2021-04-17 NOTE — Patient Instructions (Signed)

## 2021-04-18 NOTE — Progress Notes (Signed)
Remote pacemaker transmission.   

## 2021-04-20 NOTE — Progress Notes (Signed)
Patient ID: Abigail Wallace, female   DOB: 01/02/1946, 76 y.o.   MRN: [id number] ?  ? ?Cardiology Office Note   ? ?Date:  04/20/2021  ? ?ID:  [place], [place] 17-Jun-1945, MRN [id number] ? ?PCP:  Martyn Malay, MD  ?Cardiologist:   Sanda Klein, MD  ? ?Chief Complaint  ?Patient presents with  ? Atrial Fibrillation  ? Pacemaker Check  ? ? ? ?History of Present Illness:  ?Abigail Wallace is a 76 y.o. female with complete heart block and sinus node dysfunction who presents for atrial fibrillation and a pacemaker check (initial pacemaker and current leads implanted in 2007, most recent generator change Medtronic Adapta 2016). As always, her son [name] accompanies her today.  Other significant medical problems include obstructive sleep apnea, morbid obesity, AAA, mixed hyperlipidemia and history of schizophrenia. ? ?She was hospitalized about a month ago with acute respiratory failure with hypoxia.  Clear whether she had heart failure exacerbation or COPD exacerbation.  She was treated both with loop diuretics as well as intravenous steroids and antibiotics.  Since she only received a single 20 mg doses of furosemide and then improved gradually over the next several days, it appears most likely that this was COPD exacerbation.  PE was minimally elevated at 196 (previously 147.6 in June 2021).  There was no atrial fibrillation during that hospital stay has not had atrial fibrillation since her successful cardioversion in January 2023.  She did have markedly elevated blood glucose level following treatment with steroids and these are just now starting to improve. ? ?One positive result of her recent hospitalization is that she has completely quit smoking. ? ?Following her hospital stay she has had some problems with low blood pressure and we have reduced the dose of her amlodipine and her metoprolol.  Today her blood pressure is adequate at 138/62.  She has been compliant with Eliquis anticoagulation and has not had bleeding  or falls. ? ?Pacemaker interrogation shows 78% atrial pacing and 100% ventricular pacing.  During her hospital stay she had a single 9 beat run of nonsustained VT.  As mentioned no atrial fibrillation.  Device function is normal.  Her leads were implanted in 2007 but still have excellent parameters.  Her pacemaker generator is a Medtronic Adapta implanted in 2016 and has an estimated 5 years of remaining longevity.  She occasionally has idioventricular escape rhythm at about 30 bpm, but this is not consistently present. ? ?Remains morbidly obese with a BMI of 42.  Hemoglobin A1c before her hospitalization was excellent at 6.1%.  Most recent LDL cholesterol was 77.  Most recent creatinine was 1.53 on 04/10/2021. ? ?She has a small infrarenal abdominal aortic aneurysm that was measured with a diameter of 3.4 cm on ultrasound.  This area of the aorta measured 2.9 cm on CT of the abdomen.  She does not have known CAD or PAD, other than the infrarenal AAA. ? ? ?She initially received a dual-chamber permanent pacemaker in 2007 for symptomatic AV block. She has minimal coronary atherosclerosis by cardiac catheterization performed in 2007 and no evidence of insufficiency by nuclear perfusion testing in 2012. By echocardiography she has normal left ventricular size and systolic function and no major structural cardiac abnormalities.  ? ? ?Past Medical History:  ?Diagnosis Date  ? Abdominal wall hernia 01/2017  ? Atrial fibrillation ([place])   ? CHF (congestive heart failure) ([place])   ? CKD (chronic kidney disease)   ? COPD (chronic obstructive pulmonary disease) (  [place])   ? Coronary artery disease   ? NON-CRITICAL  ? Ectasis aorta ([facility])   ? GERD (gastroesophageal reflux disease)   ? Gout   ? Hypertension   ? Memory difficulties 12/02/2013  ? Pacemaker   ? Psychosis ([place])   ? Tardive dyskinesia 12/02/2013  ? Type 2 diabetes mellitus ([place])   ? ? ?Past Surgical History:  ?Procedure Laterality Date  ? CARDIAC CATHETERIZATION  2007  ?  Non-critical.   ? CARDIOVERSION N/A 01/27/2021  ? Procedure: CARDIOVERSION;  Surgeon: Geralynn Rile, MD;  Location: [facility];  Service: Cardiovascular;  Laterality: N/A;  ? CHOLECYSTECTOMY    ? ELBOW SURGERY    ? EP IMPLANTABLE DEVICE N/A 08/31/2014  ? Procedure: PPM Generator Changeout;  Surgeon: Sanda Klein, MD;  Location: [facility];  Service: Cardiovascular;  Laterality: N/A;  ? INSERT / REPLACE / REMOVE PACEMAKER  2007  ? [place]/SYMPTOMATIC HEART BLOCK  ? PACEMAKER PLACEMENT  09/28/2005  ? 2/2 SSS?  ? Persantine stress test  05/01/2010  ? EF 66%. Normal LV sys fx. Unchanged from previous studies.   ? TRANSTHORACIC ECHOCARDIOGRAM  09/08/10  ? SEVERE CONCENTRIC HYPERTROPHY.LV FUNCTION WAS VIGOROUS.EF 65%-70%.VENTRICULAR SEPTUM-INCOORDINATE MOTION.LEFT ATRIUM-MILDLY DILATED.TRIVIAL TR.  ? TUBAL LIGATION    ? ? ?Outpatient Medications Prior to Visit  ?Medication Sig Dispense Refill  ? acetaminophen (TYLENOL) 500 MG tablet Take 1,000 mg by mouth every 6 (six) hours as needed for moderate pain.     ? allopurinol (ZYLOPRIM) 100 MG tablet Take 1 tablet (100 mg total) by mouth daily. 90 tablet 3  ? amLODipine (NORVASC) 10 MG tablet Take 10 mg by mouth daily.    ? apixaban (ELIQUIS) 5 MG TABS tablet Take 1 tablet (5 mg total) by mouth 2 (two) times daily. 180 tablet 3  ? Blood Glucose Monitoring Suppl (ONETOUCH VERIO) w/Device KIT Check blood sugar once daily 1 kit 3  ? cholecalciferol (VITAMIN D3) 25 MCG (1000 UNIT) tablet Take 1 tablet (1,000 Units total) by mouth daily. 90 tablet 3  ? diclofenac Sodium (VOLTAREN) 1 % GEL Apply 2 g topically 4 (four) times daily as needed (pain). 350 g 2  ? divalproex (DEPAKOTE ER) 250 MG 24 hr tablet Take 250 mg by mouth at bedtime.     ? docusate sodium (COLACE) 100 MG capsule Take 1 capsule (100 mg total) by mouth 2 (two) times daily. (Patient taking differently: Take 100 mg by mouth daily as needed for mild constipation.) 10 capsule 0  ? glucose blood  (ONETOUCH VERIO) test strip Please use to check blood sugar once daily. E11.9 100 each 12  ? glucose blood test strip Test each morning before breakfast 100 each 12  ? Incontinence Supplies MISC 1 Units by Does not apply route as needed. 100 each prn  ? ketoconazole (NIZORAL) 2 % cream Apply 1 application. topically daily. 15 g 1  ? Lancet Device MISC 1 Device by Does not apply route 3 (three) times a week. 1 each 11  ? linagliptin (TRADJENTA) 5 MG TABS tablet Take 1 tablet (5 mg total) by mouth daily. 90 tablet 3  ? metoprolol tartrate (LOPRESSOR) 50 MG tablet Take 1 tablet (50 mg total) by mouth 2 (two) times daily. 180 tablet 3  ? mupirocin cream (BACTROBAN) 2 % Apply 1 application. topically 2 (two) times daily. 15 g 3  ? nystatin cream (MYCOSTATIN) Apply 1 application. topically 2 (two) times daily. 30 g 0  ? OLANZapine (ZYPREXA) 10  MG tablet Take 10 mg by mouth at bedtime.  0  ? olmesartan (BENICAR) 5 MG tablet Take 5 mg by mouth every evening.    ? OneTouch Delica Lancets 14C MISC Use to test once daily.  DX code:E11.9 100 each 3  ? pravastatin (PRAVACHOL) 40 MG tablet TAKE ONE TABLET BY MOUTH EVERY EVENING 90 tablet 0  ? sodium bicarbonate 650 MG tablet Take 1 tablet (650 mg total) by mouth 2 (two) times daily. 180 tablet 3  ? umeclidinium-vilanterol (ANORO ELLIPTA) 62.5-25 MCG/ACT AEPB Inhale 1 puff into the lungs daily.    ? albuterol (VENTOLIN HFA) 108 (90 Base) MCG/ACT inhaler Inhale 2 puffs into the lungs every 6 (six) hours as needed for wheezing or shortness of breath. (Patient not taking: Reported on 04/17/2021) 18 g 3  ? hydrocortisone cream 0.5 % Apply 1 application topically 2 (two) times daily. Apply WITH Nystatin under left breast (Patient not taking: Reported on 04/17/2021) 30 g 0  ? ipratropium-albuterol (DUONEB) 0.5-2.5 (3) MG/3ML SOLN Take 3 mLs by nebulization every 6 (six) hours as needed. (Patient not taking: Reported on 04/17/2021) 360 mL 1  ? nicotine polacrilex (NICOTINE MINI) 2 MG lozenge  Take 1 lozenge (2 mg total) by mouth as needed for smoking cessation. (Patient not taking: Reported on 04/17/2021) 100 tablet 0  ? nitroGLYCERIN (NITROSTAT) 0.4 MG SL tablet PLACE ONE TABLET UNDER THE TONGUE

## 2021-04-24 ENCOUNTER — Encounter: Payer: Self-pay | Admitting: Family Medicine

## 2021-04-24 ENCOUNTER — Telehealth: Payer: Self-pay | Admitting: Family Medicine

## 2021-04-24 DIAGNOSIS — M25512 Pain in left shoulder: Secondary | ICD-10-CM

## 2021-04-24 DIAGNOSIS — R300 Dysuria: Secondary | ICD-10-CM

## 2021-04-24 NOTE — Telephone Encounter (Signed)
Called and addressed at 1145. See separate note.  ? ?[name], MD  ?Family Medicine Teaching Service  ? ?

## 2021-04-24 NOTE — Telephone Encounter (Signed)
Called patient to check in---doing well. Will message son about sugar and BP.  ? ?[name], MD  ?Family Medicine Teaching Service  ? ? ?

## 2021-04-24 NOTE — Telephone Encounter (Signed)
Patient is calling and would like for Dr. [name] to call her concerning her scans and follow up appointments at [facility].  ? ?The best call back is [phone] ? ?

## 2021-04-25 ENCOUNTER — Other Ambulatory Visit: Payer: Self-pay

## 2021-04-25 DIAGNOSIS — M546 Pain in thoracic spine: Secondary | ICD-10-CM

## 2021-04-25 MED ORDER — DICLOFENAC SODIUM 1 % EX GEL: 2.0000 g | Freq: Four times a day (QID) | CUTANEOUS | 2 refills | Status: DC | PRN

## 2021-05-01 ENCOUNTER — Telehealth: Payer: Self-pay | Admitting: Cardiovascular Disease

## 2021-05-01 DIAGNOSIS — I714 Abdominal aortic aneurysm, without rupture, unspecified: Secondary | ICD-10-CM

## 2021-05-01 NOTE — Telephone Encounter (Signed)
Returned the call to the patient. She was returning a phone call to get her AAA duplex scheduled. Message sent to scheduling.  ?

## 2021-05-01 NOTE — Telephone Encounter (Signed)
Patient calling to see if she can go ahead get xray schd. There is no order for this. Please advise ?

## 2021-05-02 ENCOUNTER — Telehealth: Payer: Self-pay | Admitting: Cardiovascular Disease

## 2021-05-02 NOTE — Telephone Encounter (Signed)
LMTCB

## 2021-05-02 NOTE — Telephone Encounter (Signed)
Patient AAA Duplex order needs to be extended past the May 9th date. She can't come until July, she has too many appts right now.  ?

## 2021-05-03 ENCOUNTER — Ambulatory Visit (HOSPITAL_COMMUNITY): Payer: Medicare Other

## 2021-05-05 ENCOUNTER — Telehealth: Payer: Self-pay | Admitting: Cardiovascular Disease

## 2021-05-05 ENCOUNTER — Ambulatory Visit (HOSPITAL_COMMUNITY)
Admission: RE | Admit: 2021-05-05 | Discharge: 2021-05-05 | Disposition: A | Discharge status: Home / Self Care | Payer: Medicare Other | Source: Ambulatory Visit | Attending: Family Medicine | Admitting: Family Medicine

## 2021-05-05 DIAGNOSIS — R918 Other nonspecific abnormal finding of lung field: Secondary | ICD-10-CM | POA: Diagnosis not present

## 2021-05-05 DIAGNOSIS — J439 Emphysema, unspecified: Secondary | ICD-10-CM | POA: Diagnosis not present

## 2021-05-05 NOTE — Telephone Encounter (Signed)
Duplicate

## 2021-05-05 NOTE — Telephone Encounter (Signed)
Patient returning call, she cannot do her AAA Duplex until July and needs the dates extended. Please advise.  ?

## 2021-05-05 NOTE — Telephone Encounter (Signed)
New doppler ordered. See other messages.  ?

## 2021-05-05 NOTE — Telephone Encounter (Signed)
There is no urgency to perform that ultrasound.  Whenever it suits her. ?

## 2021-05-05 NOTE — Telephone Encounter (Signed)
Patient's son called in to schedule AAA Duplex. Order expires on 05/09, and the test was scheduled for 05/10. Needing order to be placed and attached to appointment. Please advise.  ?

## 2021-05-08 ENCOUNTER — Other Ambulatory Visit (HOSPITAL_COMMUNITY): Payer: Self-pay

## 2021-05-08 ENCOUNTER — Other Ambulatory Visit: Payer: Self-pay | Admitting: Cardiovascular Disease

## 2021-05-09 ENCOUNTER — Telehealth: Payer: Self-pay | Admitting: Family Medicine

## 2021-05-09 ENCOUNTER — Other Ambulatory Visit: Payer: Self-pay

## 2021-05-09 DIAGNOSIS — I10 Essential (primary) hypertension: Secondary | ICD-10-CM

## 2021-05-09 DIAGNOSIS — B354 Tinea corporis: Secondary | ICD-10-CM

## 2021-05-09 MED ORDER — METOPROLOL TARTRATE 50 MG PO TABS: 50.0000 mg | ORAL_TABLET | Freq: Two times a day (BID) | ORAL | 3 refills | Status: DC

## 2021-05-09 MED ORDER — VITAMIN D 25 MCG (1000 UNIT) PO TABS: 1000.0000 [IU] | ORAL_TABLET | Freq: Every day | ORAL | 3 refills | Status: DC

## 2021-05-09 MED ORDER — KETOCONAZOLE 2 % EX CREA: 1.0000 "application " | TOPICAL_CREAM | Freq: Every day | CUTANEOUS | 1 refills | Status: DC

## 2021-05-09 NOTE — Telephone Encounter (Signed)
Called with CT results. Repeat in 1 year. ? ?[name], MD  ?Family Medicine Teaching Service  ? ?

## 2021-05-15 ENCOUNTER — Encounter: Payer: Medicare Other | Admitting: Cardiovascular Disease

## 2021-05-16 DIAGNOSIS — M1990 Unspecified osteoarthritis, unspecified site: Secondary | ICD-10-CM | POA: Diagnosis not present

## 2021-05-22 NOTE — Progress Notes (Signed)
Telephone Note ?Called patient's son. Mr. Abigail Wallace ?Left shoulder pain ?Started 1-2 weeks ago. Occurs when she lays on it or uses L hand to push off chair (wheelchair). No CP, Dyspnea. No falls. Patient reports it hurts when lifting up arm. Likely rotator cuff tear, will obtain CXR for OA. Recommended follow up visit to assess.  ? ?Urinary symptoms  ?Son has noticed cloudy urine and malodorous. Has been showering less (doesn't like showers, fears falling). Uses sliding care. Patient has noticed urinary frequency, urgency, and some burning. Discussed return precautions, son very aware. Urine culture ordered as future for collection on Wednesday May 10.  ? ?All questions answered. [name], MD  ?Family Medicine Teaching Service  ? ?

## 2021-05-24 ENCOUNTER — Other Ambulatory Visit (INDEPENDENT_AMBULATORY_CARE_PROVIDER_SITE_OTHER): Payer: Medicare Other

## 2021-05-24 ENCOUNTER — Ambulatory Visit (HOSPITAL_COMMUNITY)
Admission: RE | Admit: 2021-05-24 | Discharge: 2021-05-24 | Disposition: A | Discharge status: Home / Self Care | Payer: Medicare Other | Source: Ambulatory Visit | Attending: Cardiovascular Disease | Admitting: Cardiovascular Disease

## 2021-05-24 ENCOUNTER — Ambulatory Visit
Admission: RE | Admit: 2021-05-24 | Discharge: 2021-05-24 | Disposition: A | Discharge status: Home / Self Care | Payer: Medicare Other | Source: Ambulatory Visit | Attending: Family Medicine | Admitting: Family Medicine

## 2021-05-24 ENCOUNTER — Other Ambulatory Visit: Payer: Self-pay | Admitting: Family Medicine

## 2021-05-24 DIAGNOSIS — I714 Abdominal aortic aneurysm, without rupture, unspecified: Secondary | ICD-10-CM | POA: Insufficient documentation

## 2021-05-24 DIAGNOSIS — M79602 Pain in left arm: Secondary | ICD-10-CM

## 2021-05-24 DIAGNOSIS — R300 Dysuria: Secondary | ICD-10-CM

## 2021-05-24 DIAGNOSIS — M25512 Pain in left shoulder: Secondary | ICD-10-CM

## 2021-05-24 DIAGNOSIS — I7143 Infrarenal abdominal aortic aneurysm, without rupture: Secondary | ICD-10-CM

## 2021-05-24 DIAGNOSIS — R600 Localized edema: Secondary | ICD-10-CM | POA: Diagnosis not present

## 2021-05-24 LAB — POCT URINALYSIS DIP (MANUAL ENTRY)
Bilirubin, UA: NEGATIVE
Glucose, UA: 100 mg/dL — AB
Ketones, POC UA: NEGATIVE mg/dL
Leukocytes, UA: NEGATIVE
Nitrite, UA: NEGATIVE
Protein Ur, POC: 100 mg/dL — AB
Spec Grav, UA: 1.01 (ref 1.010–1.025)
Urobilinogen, UA: 0.2 E.U./dL
pH, UA: 6.5 (ref 5.0–8.0)

## 2021-05-24 LAB — POCT UA - MICROSCOPIC ONLY: WBC, Ur, HPF, POC: NONE SEEN (ref 0–5)

## 2021-05-26 LAB — URINE CULTURE

## 2021-05-29 ENCOUNTER — Telehealth: Payer: Self-pay

## 2021-05-29 NOTE — Telephone Encounter (Signed)
Patient calls nurse line reporting continued UTI symptoms.  ? ?Patient reports dysuria, urinary frequency, and "cold chills." ? ?Patient denies and fevers, back pain, flank pain or abdominal pain.  ? ?Patient is requesting an antibiotic.  ? ?Will forward to PCP.  ?

## 2021-05-29 NOTE — Telephone Encounter (Signed)
Called patient to discuss. Discussed trace blood on dipstick, negative micro (no microscopic hematuria)--patient worried about this. Discussed recommendation to be see for blood, as this could be from elsewhere. Given negative culture, recommend being seen for this. Patient going to discuss with son, [name].  ?All questions answered ?[name], MD  ?Family Medicine Teaching Service  ? ?

## 2021-05-31 ENCOUNTER — Other Ambulatory Visit: Payer: Self-pay | Admitting: *Deleted

## 2021-05-31 DIAGNOSIS — I714 Abdominal aortic aneurysm, without rupture, unspecified: Secondary | ICD-10-CM

## 2021-06-13 ENCOUNTER — Encounter: Payer: Self-pay | Admitting: Family Medicine

## 2021-06-15 DIAGNOSIS — M1990 Unspecified osteoarthritis, unspecified site: Secondary | ICD-10-CM | POA: Diagnosis not present

## 2021-06-16 ENCOUNTER — Ambulatory Visit (INDEPENDENT_AMBULATORY_CARE_PROVIDER_SITE_OTHER): Payer: Medicare Other | Admitting: Family Medicine

## 2021-06-16 ENCOUNTER — Encounter: Payer: Self-pay | Admitting: Family Medicine

## 2021-06-16 VITALS — BP 141/86 | HR 81

## 2021-06-16 DIAGNOSIS — F39 Unspecified mood [affective] disorder: Secondary | ICD-10-CM | POA: Diagnosis not present

## 2021-06-16 DIAGNOSIS — N1832 Chronic kidney disease, stage 3b: Secondary | ICD-10-CM

## 2021-06-16 DIAGNOSIS — M25512 Pain in left shoulder: Secondary | ICD-10-CM

## 2021-06-16 DIAGNOSIS — G8929 Other chronic pain: Secondary | ICD-10-CM | POA: Diagnosis not present

## 2021-06-16 DIAGNOSIS — Z780 Asymptomatic menopausal state: Secondary | ICD-10-CM

## 2021-06-16 DIAGNOSIS — I10 Essential (primary) hypertension: Secondary | ICD-10-CM

## 2021-06-16 DIAGNOSIS — E119 Type 2 diabetes mellitus without complications: Secondary | ICD-10-CM

## 2021-06-16 LAB — POCT GLYCOSYLATED HEMOGLOBIN (HGB A1C): HbA1c, POC (controlled diabetic range): 6.9 % (ref 0.0–7.0)

## 2021-06-16 LAB — POCT URINALYSIS DIP (MANUAL ENTRY)
Bilirubin, UA: NEGATIVE
Glucose, UA: 100 mg/dL — AB
Ketones, POC UA: NEGATIVE mg/dL
Leukocytes, UA: NEGATIVE
Nitrite, UA: NEGATIVE
Protein Ur, POC: >=300 mg/dL — AB
Spec Grav, UA: 1.02 (ref 1.010–1.025)
Urobilinogen, UA: 0.2 E.U./dL
pH, UA: 5.5 (ref 5.0–8.0)

## 2021-06-16 LAB — POCT UA - MICROSCOPIC ONLY
Epithelial cells, urine per micros: >20
RBC, Urine, Miroscopic: NONE SEEN (ref 0–2)
WBC, Ur, HPF, POC: NONE SEEN (ref 0–5)

## 2021-06-16 MED ORDER — RAMELTEON 8 MG PO TABS: 8.0000 mg | ORAL_TABLET | Freq: Every day | ORAL | 1 refills | Status: DC

## 2021-06-16 NOTE — Progress Notes (Signed)
SUBJECTIVE:   CHIEF COMPLAINT: urinary symptoms, shoulder  HPI:   Abigail Wallace is a 76 y.o.  with history notable for CKD III, type 2 diabetes not on insulin, and atrial fibrillation with pacemaker on apixaban presenting for follow up. She is joined by [name], who is attentive to her needs.   The patient reports ongoing dizziness.  She has had intermittent dizziness in the past.  This is intermittent.  She has no associated chest pain dyspnea weakness numbness.  This is worse when she is anxious.  She has tried nothing for this.  She has no associated tinnitus or ear symptoms.  She is taking her medications as prescribed with the exception of when her inhalers.  The patient has had intermittent mucus production at night.  She is not using her inhalers.  She is no longer smoking.  She is not sleeping well due to this.  The patient reports ongoing left shoulder pain.  She is using Tylenol.  She stopped using Voltaren gel as she thought this was contributing to her dizziness.  Denies neck pain weakness or numbness in the left arm.  The patient has not been applying an ointment to irregularly shaped patch on her back.  This is not improved.  PERTINENT  PMH / PSH/Family/Social History : Updated and reviewed as appropriate.  OBJECTIVE:   BP (!) 141/86   Pulse 81   SpO2 98%   Today's weight:  Review of prior weights: There were no vitals filed for this visit.  On the right scapular area there is a 1.5 cm x 1 cm hyperpigmented slightly raised patch that is also partially erythematous with telangiectasias. Regular rate and rhythm no murmurs rubs or gallops lungs are clear bilaterally feet examined there are no lesions or ulcers.  Kyphosis of neck noted.  Some tenderness to palpation along left AC joint and left glenohumeral joint.  With passive range of motion to 120 she has a positive painful arc on L.  With active range of motion she is able to get just above 90. + [place]    ASSESSMENT/PLAN:   Well controlled type 2 diabetes mellitus ([place]) Fasting blood sugars have been 140s to 160s.  A1c is elevated today.  We discussed at length.  I suspect this is due to immobility and increased consumption of sweets.  Discussed at length.  Patient willing to try increased mobility.  She is not interested in additional medication.  Essential hypertension Blood pressure just above goal today.  Home blood pressures have been very well controlled and thus will not adjust regimen today in light of her dizziness.   Intermittent dizziness unclear cause.  No new medications.  Blood pressure is appropriate and actually higher than usual thus I do not think this is contributing.  No cardiac symptoms suggestive of this is the cause.  No new medication changes other than her medications for her anxiety.  I suspect this is in part due to her poor sleep and overwhelming anxiety.  Other differentials considered but are less likely including cerebrovascular event given this is intermittent.  Will obtain CBC and CMP today.  Prescribed ramelteon for sleep we will see if this is covered.  They are to follow-up this does not improve could consider neuroimaging. Reviewed strict return precautions.   Deconditioning and immobility most concerning to me is that since the patient's hospital stay she has not been very mobile.  She fears falling.  Discussed physical therapy at length.  Her  anxiety precludes her from being mobile.  Her left shoulder is bothering her as below and thus we will plan to treat that and see if we can get her back to some degree of mobility this is going to be incredibly important for her longevity.  Left shoulder pain suspect rotator cuff tear referral to sports medicine for ultrasound and consideration of injection also considered referred neck pain but I think this is less likely.  No cardiac symptoms suggestive of cardiac pathology and referred pain from this area.  Discussed  range of motion exercises.  Pigmented skin lesion, will connect with Dermatology clinic. Concern basal cell or other pathology.   HCM  DEXA ordered.    [name], [place]

## 2021-06-16 NOTE — Assessment & Plan Note (Signed)
Fasting blood sugars have been 140s to 160s.  A1c is elevated today.  We discussed at length.  I suspect this is due to immobility and increased consumption of sweets.  Discussed at length.  Patient willing to try increased mobility.  She is not interested in additional medication.

## 2021-06-16 NOTE — Assessment & Plan Note (Signed)
Blood pressure just above goal today.  Home blood pressures have been very well controlled and thus will not adjust regimen today in light of her dizziness.

## 2021-06-16 NOTE — Patient Instructions (Addendum)
It was wonderful to see you today.  Please bring ALL of your medications with you to every visit.   Today we talked about:  -- Nystatin- use under breasts and under skin folds  For shoulder pain--tylenol is okay  Try to walk a few steps a day   Cut back on sweets    For the  spot on your Right back--I think we need to biopsy--you will be called by our office  For your blood work--I will call you with results  I sent in a medicine for your sleep  I recommend you undergo a DEXA bone density test.   You can call to schedule an appointment by calling [phone].   Directions [address], [place] [address]  Please let me know if you have questions. I will send you a letter or call you with results.     Thank you for choosing [facility].   Please call [phone] with any questions about today's appointment.  Please be sure to schedule follow up at the front  desk before you leave today.   [name], MD  Family Medicine

## 2021-06-17 LAB — LIPID PANEL
Chol/HDL Ratio: 3.8 ratio (ref 0.0–4.4)
Cholesterol, Total: 153 mg/dL (ref 100–199)
HDL: 40 mg/dL (ref >39)
LDL Chol Calc (NIH): 73 mg/dL (ref 0–99)
Triglycerides: 243 mg/dL — ABNORMAL HIGH (ref 0–149)
VLDL Cholesterol Cal: 40 mg/dL (ref 5–40)

## 2021-06-17 LAB — CBC WITH DIFFERENTIAL/PLATELET
Basophils Absolute: 0.1 10*3/uL (ref 0.0–0.2)
Basos: 1 %
EOS (ABSOLUTE): 0.2 10*3/uL (ref 0.0–0.4)
Eos: 1 %
Hematocrit: 36 % (ref 34.0–46.6)
Hemoglobin: 11.8 g/dL (ref 11.1–15.9)
Immature Grans (Abs): 0.1 10*3/uL (ref 0.0–0.1)
Immature Granulocytes: 1 %
Lymphocytes Absolute: 3.9 10*3/uL — ABNORMAL HIGH (ref 0.7–3.1)
Lymphs: 26 %
MCH: 28.4 pg (ref 26.6–33.0)
MCHC: 32.8 g/dL (ref 31.5–35.7)
MCV: 87 fL (ref 79–97)
Monocytes Absolute: 0.8 10*3/uL (ref 0.1–0.9)
Monocytes: 5 %
Neutrophils Absolute: 9.9 10*3/uL — ABNORMAL HIGH (ref 1.4–7.0)
Neutrophils: 66 %
Platelets: 302 10*3/uL (ref 150–450)
RBC: 4.16 x10E6/uL (ref 3.77–5.28)
RDW: 13.4 % (ref 11.7–15.4)
WBC: 15 10*3/uL — ABNORMAL HIGH (ref 3.4–10.8)

## 2021-06-17 LAB — COMPREHENSIVE METABOLIC PANEL
ALT: 7 IU/L (ref 0–32)
AST: 9 IU/L (ref 0–40)
Albumin/Globulin Ratio: 1.8 (ref 1.2–2.2)
Albumin: 4.4 g/dL (ref 3.7–4.7)
Alkaline Phosphatase: 62 IU/L (ref 44–121)
BUN/Creatinine Ratio: 27 (ref 12–28)
BUN: 42 mg/dL — ABNORMAL HIGH (ref 8–27)
Bilirubin Total: <0.2 mg/dL (ref 0.0–1.2)
CO2: 18 mmol/L — ABNORMAL LOW (ref 20–29)
Calcium: 10.2 mg/dL (ref 8.7–10.3)
Chloride: 106 mmol/L (ref 96–106)
Creatinine, Ser: 1.58 mg/dL — ABNORMAL HIGH (ref 0.57–1.00)
Globulin, Total: 2.5 g/dL (ref 1.5–4.5)
Glucose: 139 mg/dL — ABNORMAL HIGH (ref 70–99)
Potassium: 4.9 mmol/L (ref 3.5–5.2)
Sodium: 138 mmol/L (ref 134–144)
Total Protein: 6.9 g/dL (ref 6.0–8.5)
eGFR: 33 mL/min/{1.73_m2} — ABNORMAL LOW (ref >59)

## 2021-06-18 ENCOUNTER — Encounter: Payer: Self-pay | Admitting: Family Medicine

## 2021-06-18 DIAGNOSIS — L304 Erythema intertrigo: Secondary | ICD-10-CM

## 2021-06-18 LAB — URINE CULTURE

## 2021-06-19 ENCOUNTER — Telehealth: Payer: Self-pay | Admitting: Family Medicine

## 2021-06-19 ENCOUNTER — Other Ambulatory Visit: Payer: Self-pay | Admitting: Family Medicine

## 2021-06-19 DIAGNOSIS — D72829 Elevated white blood cell count, unspecified: Secondary | ICD-10-CM

## 2021-06-19 NOTE — Telephone Encounter (Signed)
Called patient's son.  Relayed results.  Discussed white blood cell count at length.  Urine culture negative.  Given her cough will obtain chest x-ray to evaluate for pneumonia.  Gave strict return precautions for this week.  If persistently elevated will evaluate with hematology consultation.  All questions answered.  CBC with differential and smear ordered for Friday.

## 2021-06-20 ENCOUNTER — Encounter: Payer: Self-pay | Admitting: *Deleted

## 2021-06-21 ENCOUNTER — Telehealth: Payer: Self-pay

## 2021-06-21 ENCOUNTER — Other Ambulatory Visit: Payer: Medicare Other

## 2021-06-21 ENCOUNTER — Ambulatory Visit
Admission: RE | Admit: 2021-06-21 | Discharge: 2021-06-21 | Disposition: A | Discharge status: Home / Self Care | Payer: Medicare Other | Source: Ambulatory Visit | Attending: Family Medicine | Admitting: Family Medicine

## 2021-06-21 DIAGNOSIS — D72829 Elevated white blood cell count, unspecified: Secondary | ICD-10-CM

## 2021-06-21 DIAGNOSIS — R059 Cough, unspecified: Secondary | ICD-10-CM | POA: Diagnosis not present

## 2021-06-21 DIAGNOSIS — Z780 Asymptomatic menopausal state: Secondary | ICD-10-CM

## 2021-06-21 DIAGNOSIS — Z950 Presence of cardiac pacemaker: Secondary | ICD-10-CM | POA: Diagnosis not present

## 2021-06-21 DIAGNOSIS — I517 Cardiomegaly: Secondary | ICD-10-CM | POA: Diagnosis not present

## 2021-06-21 NOTE — Telephone Encounter (Signed)
Patients son calls nurse line for (2) reasons.   Son reports patient is still having episodes of SOB. Son reports all of her vitals are WNL including pulse ox. Son states, "with her mental health declining its hard to know what is going on."   Son reports he is taking her to have chest xray done today and [facility] for lab work.   Son reports he called [facility] for DEXA scan and they have no availability until November.   Son would like order to go to [place].  Will forward to PCP and Referral Coordinator.

## 2021-06-21 NOTE — Telephone Encounter (Signed)
DEXA order changed.   [name], MD  Family Medicine Teaching Service

## 2021-06-21 NOTE — Telephone Encounter (Signed)
Please change bone density location to external.  The order will print, please sign and place on my desk.  I will fax it off this week to [facility].    Thanks [employer]

## 2021-06-22 ENCOUNTER — Telehealth: Payer: Self-pay | Admitting: Family Medicine

## 2021-06-22 LAB — CBC WITH DIFFERENTIAL/PLATELET
Basophils Absolute: 0.1 10*3/uL (ref 0.0–0.2)
Basos: 1 %
EOS (ABSOLUTE): 0.2 10*3/uL (ref 0.0–0.4)
Eos: 2 %
Hematocrit: 33.6 % — ABNORMAL LOW (ref 34.0–46.6)
Hemoglobin: 11.1 g/dL (ref 11.1–15.9)
Immature Grans (Abs): 0.1 10*3/uL (ref 0.0–0.1)
Immature Granulocytes: 1 %
Lymphocytes Absolute: 3.5 10*3/uL — ABNORMAL HIGH (ref 0.7–3.1)
Lymphs: 29 %
MCH: 28 pg (ref 26.6–33.0)
MCHC: 33 g/dL (ref 31.5–35.7)
MCV: 85 fL (ref 79–97)
Monocytes Absolute: 0.6 10*3/uL (ref 0.1–0.9)
Monocytes: 5 %
Neutrophils Absolute: 7.6 10*3/uL — ABNORMAL HIGH (ref 1.4–7.0)
Neutrophils: 62 %
Platelets: 277 10*3/uL (ref 150–450)
RBC: 3.97 x10E6/uL (ref 3.77–5.28)
RDW: 13.2 % (ref 11.7–15.4)
WBC: 12 10*3/uL — ABNORMAL HIGH (ref 3.4–10.8)

## 2021-06-22 MED ORDER — FUROSEMIDE 20 MG PO TABS: ORAL_TABLET | ORAL | 0 refills | Status: DC

## 2021-06-22 NOTE — Telephone Encounter (Addendum)
Called son and patient with results. Has possible very small pleural effusion. Suspect due to CHF. Was in hospital a few months ago, has been laying on L side. Will give 2 days of Lasix, discussed how to take. Will monitor and follow up. Reviewed reasons to call and NOT to take Lasix regularly.  [name], MD  Family Medicine Teaching Service

## 2021-06-23 ENCOUNTER — Other Ambulatory Visit: Payer: Self-pay

## 2021-06-23 DIAGNOSIS — L304 Erythema intertrigo: Secondary | ICD-10-CM

## 2021-06-23 MED ORDER — NYSTATIN 100000 UNIT/GM EX CREA: 1.0000 "application " | TOPICAL_CREAM | Freq: Two times a day (BID) | CUTANEOUS | 0 refills | Status: DC

## 2021-07-03 ENCOUNTER — Ambulatory Visit (INDEPENDENT_AMBULATORY_CARE_PROVIDER_SITE_OTHER): Payer: Medicare Other

## 2021-07-03 DIAGNOSIS — I951 Orthostatic hypotension: Secondary | ICD-10-CM | POA: Diagnosis not present

## 2021-07-03 DIAGNOSIS — J449 Chronic obstructive pulmonary disease, unspecified: Secondary | ICD-10-CM | POA: Diagnosis not present

## 2021-07-03 DIAGNOSIS — I4819 Other persistent atrial fibrillation: Secondary | ICD-10-CM | POA: Diagnosis not present

## 2021-07-03 DIAGNOSIS — M179 Osteoarthritis of knee, unspecified: Secondary | ICD-10-CM | POA: Diagnosis not present

## 2021-07-03 MED ORDER — NYSTATIN 100000 UNIT/GM EX POWD: 1.0000 | Freq: Three times a day (TID) | CUTANEOUS | 1 refills | Status: DC

## 2021-07-05 LAB — CUP PACEART REMOTE DEVICE CHECK
Battery Impedance: 927 Ohm
Battery Remaining Longevity: 57 mo
Battery Voltage: 2.77 V
Brady Statistic AP VP Percent: 77 %
Brady Statistic AP VS Percent: 0 %
Brady Statistic AS VP Percent: 23 %
Brady Statistic AS VS Percent: 0 %
Date Time Interrogation Session: 20230619063200
Implantable Lead Implant Date: 20070414
Implantable Lead Implant Date: 20070914
Implantable Lead Location: 753859
Implantable Lead Location: 753860
Implantable Lead Model: 4092
Implantable Lead Model: 5594
Implantable Pulse Generator Implant Date: 20160816
Lead Channel Impedance Value: 540 Ohm
Lead Channel Impedance Value: 604 Ohm
Lead Channel Pacing Threshold Amplitude: 0.625 V
Lead Channel Pacing Threshold Amplitude: 0.75 V
Lead Channel Pacing Threshold Pulse Width: 0.4 ms
Lead Channel Pacing Threshold Pulse Width: 0.4 ms
Lead Channel Setting Pacing Amplitude: 2 V
Lead Channel Setting Pacing Amplitude: 2.5 V
Lead Channel Setting Pacing Pulse Width: 0.4 ms
Lead Channel Setting Sensing Sensitivity: 4 mV

## 2021-07-10 ENCOUNTER — Encounter: Payer: Self-pay | Admitting: Family Medicine

## 2021-07-10 LAB — PATHOLOGIST SMEAR REVIEW
Basophils Absolute: 0.1 10*3/uL (ref 0.0–0.2)
Basos: 1 %
EOS (ABSOLUTE): 0.2 10*3/uL (ref 0.0–0.4)
Eos: 2 %
Hematocrit: 34.4 % (ref 34.0–46.6)
Hemoglobin: 11.1 g/dL (ref 11.1–15.9)
Immature Grans (Abs): 0.1 10*3/uL (ref 0.0–0.1)
Immature Granulocytes: 1 %
Lymphocytes Absolute: 3.7 10*3/uL — ABNORMAL HIGH (ref 0.7–3.1)
Lymphs: 30 %
MCH: 27.6 pg (ref 26.6–33.0)
MCHC: 32.3 g/dL (ref 31.5–35.7)
MCV: 86 fL (ref 79–97)
Monocytes Absolute: 0.6 10*3/uL (ref 0.1–0.9)
Monocytes: 5 %
Neutrophils Absolute: 7.7 10*3/uL — ABNORMAL HIGH (ref 1.4–7.0)
Neutrophils: 61 %
Path Rev PLTs: NORMAL
Path Rev RBC: NORMAL
Platelets: 288 10*3/uL (ref 150–450)
RBC: 4.02 x10E6/uL (ref 3.77–5.28)
RDW: 13.3 % (ref 11.7–15.4)
WBC: 12.4 10*3/uL — ABNORMAL HIGH (ref 3.4–10.8)

## 2021-07-12 ENCOUNTER — Other Ambulatory Visit: Payer: Self-pay

## 2021-07-12 DIAGNOSIS — N1832 Chronic kidney disease, stage 3b: Secondary | ICD-10-CM

## 2021-07-12 DIAGNOSIS — I480 Paroxysmal atrial fibrillation: Secondary | ICD-10-CM

## 2021-07-12 DIAGNOSIS — J449 Chronic obstructive pulmonary disease, unspecified: Secondary | ICD-10-CM

## 2021-07-12 MED ORDER — ALLOPURINOL 100 MG PO TABS: 100.0000 mg | ORAL_TABLET | Freq: Every day | ORAL | 3 refills | Status: DC

## 2021-07-12 MED ORDER — SODIUM BICARBONATE 650 MG PO TABS: 650.0000 mg | ORAL_TABLET | Freq: Two times a day (BID) | ORAL | 3 refills | Status: DC

## 2021-07-12 MED ORDER — APIXABAN 5 MG PO TABS: 5.0000 mg | ORAL_TABLET | Freq: Two times a day (BID) | ORAL | 3 refills | Status: DC

## 2021-07-12 MED ORDER — ALBUTEROL SULFATE HFA 108 (90 BASE) MCG/ACT IN AERS: 2.0000 | INHALATION_SPRAY | Freq: Four times a day (QID) | RESPIRATORY_TRACT | 3 refills | Status: DC | PRN

## 2021-07-17 DIAGNOSIS — M1990 Unspecified osteoarthritis, unspecified site: Secondary | ICD-10-CM | POA: Diagnosis not present

## 2021-07-19 ENCOUNTER — Ambulatory Visit (INDEPENDENT_AMBULATORY_CARE_PROVIDER_SITE_OTHER): Payer: Medicare Other | Admitting: Family Medicine

## 2021-07-19 ENCOUNTER — Ambulatory Visit: Payer: Self-pay

## 2021-07-19 VITALS — BP 137/58 | Ht 66.0 in | Wt 270.0 lb

## 2021-07-19 DIAGNOSIS — G8929 Other chronic pain: Secondary | ICD-10-CM

## 2021-07-19 DIAGNOSIS — M25512 Pain in left shoulder: Secondary | ICD-10-CM | POA: Diagnosis not present

## 2021-07-19 MED ORDER — METHYLPREDNISOLONE ACETATE 40 MG/ML IJ SUSP
40.0000 mg | Freq: Once | INTRAMUSCULAR | Status: AC
  Administered 2021-07-19: 40 mg via INTRA_ARTICULAR

## 2021-07-19 NOTE — Progress Notes (Unsigned)
   New Patient Office Visit  Subjective   Patient ID: Abigail Wallace, female    DOB: 1945-11-12  Age: 76 y.o. MRN: [id number]  No chief complaint on file.   Patient is brought to clinic today by her son for evaluation of left shoulder pain. Her left shoulder has been causing her pain for the last 5 to 6 years. The pain is sharp and mostly located on the posterior and lateral aspects of the left shoulder.  She says it is especially painful at night when she sleeps because she lays on her left side.  She does take Tylenol but gets minimal pain relief.  She is limited by her comorbid conditions and is unable to take NSAIDs.  Her son provides more insight into history.  He states that she is mostly sedentary and does not move her shoulders much at all.  She is right-handed.  She does not ambulate much and is mostly wheelchair-bound.  She is not interested in any physical therapy but the son wants to provide her with the highest quality of life/most pain relief possible.  We discussed risk/benefits of subacromial steroid injection today and patient wished to proceed with the injection.    {History (Optional):23778}  ROS    Objective:     BP (!) 137/58   Ht '5\' 6"'$  (1.676 m)   Wt 270 lb (122.5 kg)   BMI 43.58 kg/m  {Vitals History (Optional):23777}  Physical Exam   No results found for any visits on 07/19/21.  {Labs (Optional):23779}  The 10-year ASCVD risk score (Arnett DK, et al., 2019) is: 62.4%    Assessment & Plan:   Problem List Items Addressed This Visit   None Visit Diagnoses     Chronic left shoulder pain    -  Primary   Relevant Medications   methylPREDNISolone acetate (DEPO-MEDROL) injection 40 mg (Completed)   Other Relevant Orders   [place] COMPLETE JOINT SPACE STRUCTURE UP LEFT       No follow-ups on file.    Karlton Lemon, MD

## 2021-07-20 ENCOUNTER — Encounter: Payer: Self-pay | Admitting: Family Medicine

## 2021-07-24 NOTE — Progress Notes (Signed)
Remote pacemaker transmission.   

## 2021-08-01 ENCOUNTER — Ambulatory Visit (INDEPENDENT_AMBULATORY_CARE_PROVIDER_SITE_OTHER): Payer: Medicare Other | Admitting: Podiatry

## 2021-08-01 ENCOUNTER — Encounter: Payer: Self-pay | Admitting: Podiatry

## 2021-08-01 DIAGNOSIS — B351 Tinea unguium: Secondary | ICD-10-CM | POA: Diagnosis not present

## 2021-08-01 DIAGNOSIS — E1142 Type 2 diabetes mellitus with diabetic polyneuropathy: Secondary | ICD-10-CM | POA: Diagnosis not present

## 2021-08-01 DIAGNOSIS — N1832 Chronic kidney disease, stage 3b: Secondary | ICD-10-CM

## 2021-08-01 DIAGNOSIS — E119 Type 2 diabetes mellitus without complications: Secondary | ICD-10-CM

## 2021-08-01 DIAGNOSIS — D631 Anemia in chronic kidney disease: Secondary | ICD-10-CM | POA: Diagnosis not present

## 2021-08-01 DIAGNOSIS — I129 Hypertensive chronic kidney disease with stage 1 through stage 4 chronic kidney disease, or unspecified chronic kidney disease: Secondary | ICD-10-CM | POA: Diagnosis not present

## 2021-08-01 DIAGNOSIS — E1169 Type 2 diabetes mellitus with other specified complication: Secondary | ICD-10-CM | POA: Diagnosis not present

## 2021-08-01 NOTE — Progress Notes (Signed)
This patient returns to my office for at risk foot care.  This patient requires this care by a professional since this patient will be at risk due to having  CKD.  This patient is unable to cut nails herself since the patient cannot reach her nails.These nails are painful walking and wearing shoes.  This patient presents for at risk foot care today.  General Appearance  Alert, conversant and in no acute stress.  Vascular  Dorsalis pedis and posterior tibial  pulses are palpable  bilaterally.  Capillary return is within normal limits  bilaterally. Temperature is within normal limits  bilaterally.  Neurologic  Senn-Weinstein monofilament wire test absent  bilaterally. Muscle power within normal limits bilaterally.  Nails Thick disfigured discolored nails with subungual debris  from hallux to fifth toes bilaterally. No evidence of bacterial infection or drainage bilaterally. Pincer right hallux nail.  Orthopedic  No limitations of motion  feet .  No crepitus or effusions noted.  No bony pathology or digital deformities noted.  Skin  normotropic skin with no porokeratosis noted bilaterally.  No signs of infections or ulcers noted.     Onychomycosis  Pain in right toes  Pain in left toes  Consent was obtained for treatment procedures.   Mechanical debridement of nails 1-5  bilaterally performed with a nail nipper.  Filed with dremel without incident.    Return office visit     4 months                Told patient to return for periodic foot care and evaluation due to potential at risk complications.   Gardiner Barefoot DPM

## 2021-08-11 ENCOUNTER — Other Ambulatory Visit: Payer: Self-pay

## 2021-08-11 DIAGNOSIS — F39 Unspecified mood [affective] disorder: Secondary | ICD-10-CM

## 2021-08-11 MED ORDER — RAMELTEON 8 MG PO TABS: 8.0000 mg | ORAL_TABLET | Freq: Every day | ORAL | 1 refills | Status: DC

## 2021-08-14 ENCOUNTER — Encounter (HOSPITAL_COMMUNITY): Payer: Self-pay | Admitting: Emergency Medicine

## 2021-08-14 ENCOUNTER — Emergency Department (HOSPITAL_COMMUNITY)
Admission: EM | Admit: 2021-08-14 | Discharge: 2021-08-15 | Disposition: A | Discharge status: Home / Self Care | Payer: Medicare Other | Attending: Emergency Medicine | Admitting: Emergency Medicine

## 2021-08-14 ENCOUNTER — Emergency Department (HOSPITAL_COMMUNITY): Payer: Medicare Other

## 2021-08-14 ENCOUNTER — Other Ambulatory Visit: Payer: Self-pay

## 2021-08-14 DIAGNOSIS — Z7901 Long term (current) use of anticoagulants: Secondary | ICD-10-CM | POA: Insufficient documentation

## 2021-08-14 DIAGNOSIS — Z8616 Personal history of COVID-19: Secondary | ICD-10-CM | POA: Insufficient documentation

## 2021-08-14 DIAGNOSIS — N189 Chronic kidney disease, unspecified: Secondary | ICD-10-CM | POA: Insufficient documentation

## 2021-08-14 DIAGNOSIS — Z20822 Contact with and (suspected) exposure to covid-19: Secondary | ICD-10-CM | POA: Diagnosis not present

## 2021-08-14 DIAGNOSIS — R0902 Hypoxemia: Secondary | ICD-10-CM | POA: Diagnosis not present

## 2021-08-14 DIAGNOSIS — I130 Hypertensive heart and chronic kidney disease with heart failure and stage 1 through stage 4 chronic kidney disease, or unspecified chronic kidney disease: Secondary | ICD-10-CM | POA: Insufficient documentation

## 2021-08-14 DIAGNOSIS — J440 Chronic obstructive pulmonary disease with acute lower respiratory infection: Secondary | ICD-10-CM | POA: Diagnosis not present

## 2021-08-14 DIAGNOSIS — E1165 Type 2 diabetes mellitus with hyperglycemia: Secondary | ICD-10-CM | POA: Diagnosis not present

## 2021-08-14 DIAGNOSIS — R0602 Shortness of breath: Secondary | ICD-10-CM | POA: Diagnosis not present

## 2021-08-14 DIAGNOSIS — Z7984 Long term (current) use of oral hypoglycemic drugs: Secondary | ICD-10-CM | POA: Diagnosis not present

## 2021-08-14 DIAGNOSIS — Z91040 Latex allergy status: Secondary | ICD-10-CM | POA: Insufficient documentation

## 2021-08-14 DIAGNOSIS — J449 Chronic obstructive pulmonary disease, unspecified: Secondary | ICD-10-CM | POA: Insufficient documentation

## 2021-08-14 DIAGNOSIS — Z7951 Long term (current) use of inhaled steroids: Secondary | ICD-10-CM | POA: Insufficient documentation

## 2021-08-14 DIAGNOSIS — E1122 Type 2 diabetes mellitus with diabetic chronic kidney disease: Secondary | ICD-10-CM | POA: Diagnosis not present

## 2021-08-14 DIAGNOSIS — Z79899 Other long term (current) drug therapy: Secondary | ICD-10-CM | POA: Insufficient documentation

## 2021-08-14 DIAGNOSIS — Z28310 Unvaccinated for covid-19: Secondary | ICD-10-CM | POA: Insufficient documentation

## 2021-08-14 DIAGNOSIS — I509 Heart failure, unspecified: Secondary | ICD-10-CM | POA: Diagnosis not present

## 2021-08-14 LAB — CBC WITH DIFFERENTIAL/PLATELET
Abs Immature Granulocytes: 0.13 10*3/uL — ABNORMAL HIGH (ref 0.00–0.07)
Basophils Absolute: 0.1 10*3/uL (ref 0.0–0.1)
Basophils Relative: 1 %
Eosinophils Absolute: 0.3 10*3/uL (ref 0.0–0.5)
Eosinophils Relative: 2 %
HCT: 35.7 % — ABNORMAL LOW (ref 36.0–46.0)
Hemoglobin: 11 g/dL — ABNORMAL LOW (ref 12.0–15.0)
Immature Granulocytes: 1 %
Lymphocytes Relative: 17 %
Lymphs Abs: 2.2 10*3/uL (ref 0.7–4.0)
MCH: 27.7 pg (ref 26.0–34.0)
MCHC: 30.8 g/dL (ref 30.0–36.0)
MCV: 89.9 fL (ref 80.0–100.0)
Monocytes Absolute: 0.6 10*3/uL (ref 0.1–1.0)
Monocytes Relative: 5 %
Neutro Abs: 9.5 10*3/uL — ABNORMAL HIGH (ref 1.7–7.7)
Neutrophils Relative %: 74 %
Platelets: 256 10*3/uL (ref 150–400)
RBC: 3.97 MIL/uL (ref 3.87–5.11)
RDW: 14.1 % (ref 11.5–15.5)
WBC: 12.8 10*3/uL — ABNORMAL HIGH (ref 4.0–10.5)
nRBC: 0 % (ref 0.0–0.2)

## 2021-08-14 LAB — COMPREHENSIVE METABOLIC PANEL
ALT: 13 U/L (ref 0–44)
AST: 16 U/L (ref 15–41)
Albumin: 3.6 g/dL (ref 3.5–5.0)
Alkaline Phosphatase: 66 U/L (ref 38–126)
Anion gap: 10 (ref 5–15)
BUN: 31 mg/dL — ABNORMAL HIGH (ref 8–23)
CO2: 18 mmol/L — ABNORMAL LOW (ref 22–32)
Calcium: 9.2 mg/dL (ref 8.9–10.3)
Chloride: 106 mmol/L (ref 98–111)
Creatinine, Ser: 1.64 mg/dL — ABNORMAL HIGH (ref 0.44–1.00)
GFR, Estimated: 32 mL/min — ABNORMAL LOW (ref >60)
Glucose, Bld: 259 mg/dL — ABNORMAL HIGH (ref 70–99)
Potassium: 3.8 mmol/L (ref 3.5–5.1)
Sodium: 134 mmol/L — ABNORMAL LOW (ref 135–145)
Total Bilirubin: 0.3 mg/dL (ref 0.3–1.2)
Total Protein: 7 g/dL (ref 6.5–8.1)

## 2021-08-14 LAB — TROPONIN I (HIGH SENSITIVITY): Troponin I (High Sensitivity): 9 ng/L (ref <18)

## 2021-08-14 NOTE — ED Triage Notes (Signed)
Patient's family member reports they were in contact with someone who has had covid and has been around patient, and now patient has symptoms x 3 days.  Patient seen by UC and sent here for hypoxemia. Patient's family member endorses patient having congestion, sore throat, headache and cough.  Patient in NAD in triage.

## 2021-08-14 NOTE — ED Provider Triage Note (Signed)
Emergency Medicine Provider Triage Evaluation Note  [facility] , a 76 y.o. female  was evaluated in triage.  Pt complains of shortness of breath for 3 days.  Family member who she lives with had a close [place] contact.  Patient has had symptoms for 3 days.  She has been increasingly short of breath with headache. She chose not to get vaccinated against COVID.  She had COVID once about 15 months ago per family   Physical Exam  BP (!) 120/104 (BP Location: Right Arm)   Pulse 96   Temp 98.9 F (37.2 C) (Oral)   Resp (!) 22   Ht '5\' 6"'$  (1.676 m)   Wt 126.1 kg   SpO2 93%   BMI 44.87 kg/m  Gen:   Awake, no distress   Resp:  Normal effort  MSK:   Moves extremities without difficulty  Other:  Patient in no distress.   Medical Decision Making  Medically screening exam initiated at 8:29 PM.  Appropriate orders placed.  [facility] was informed that the remainder of the evaluation will be completed by another provider, this initial triage assessment does not replace that evaluation, and the importance of remaining in the ED until their evaluation is complete.     Lorin Glass, [place] 08/14/21 2031

## 2021-08-15 ENCOUNTER — Telehealth: Payer: Self-pay | Admitting: Cardiovascular Disease

## 2021-08-15 ENCOUNTER — Telehealth: Payer: Self-pay | Admitting: Family Medicine

## 2021-08-15 DIAGNOSIS — R0602 Shortness of breath: Secondary | ICD-10-CM | POA: Diagnosis not present

## 2021-08-15 LAB — RESP PANEL BY RT-PCR (FLU A&B, COVID) ARPGX2
Influenza A by PCR: NEGATIVE
Influenza B by PCR: NEGATIVE
SARS Coronavirus 2 by RT PCR: NEGATIVE

## 2021-08-15 LAB — TROPONIN I (HIGH SENSITIVITY): Troponin I (High Sensitivity): 9 ng/L (ref <18)

## 2021-08-15 MED ORDER — DIVALPROEX SODIUM ER 250 MG PO TB24
250.0000 mg | ORAL_TABLET | Freq: Every day | ORAL | Status: DC
  Administered 2021-08-15: 250 mg via ORAL
  Filled 2021-08-15: qty 1

## 2021-08-15 MED ORDER — DOXYCYCLINE HYCLATE 100 MG PO CAPS
100.0000 mg | ORAL_CAPSULE | Freq: Two times a day (BID) | ORAL | 0 refills | Status: AC
Start: 2021-08-15 — End: 2021-08-22

## 2021-08-15 MED ORDER — OLANZAPINE 10 MG PO TABS
10.0000 mg | ORAL_TABLET | Freq: Every day | ORAL | Status: DC
  Administered 2021-08-15: 10 mg via ORAL
  Filled 2021-08-15: qty 1

## 2021-08-15 MED ORDER — IPRATROPIUM BROMIDE 0.02 % IN SOLN
0.5000 mg | Freq: Once | RESPIRATORY_TRACT | Status: AC
  Administered 2021-08-15: 0.5 mg via RESPIRATORY_TRACT
  Filled 2021-08-15: qty 2.5

## 2021-08-15 MED ORDER — ALBUTEROL SULFATE (2.5 MG/3ML) 0.083% IN NEBU
5.0000 mg | INHALATION_SOLUTION | Freq: Once | RESPIRATORY_TRACT | Status: AC
  Administered 2021-08-15: 5 mg via RESPIRATORY_TRACT
  Filled 2021-08-15: qty 6

## 2021-08-15 MED ORDER — APIXABAN 5 MG PO TABS
5.0000 mg | ORAL_TABLET | Freq: Two times a day (BID) | ORAL | Status: DC
Start: 2021-08-15 — End: 2021-08-15
  Administered 2021-08-15: 5 mg via ORAL
  Filled 2021-08-15: qty 1

## 2021-08-15 MED ORDER — METOPROLOL TARTRATE 25 MG PO TABS
50.0000 mg | ORAL_TABLET | Freq: Two times a day (BID) | ORAL | Status: DC
  Administered 2021-08-15: 50 mg via ORAL
  Filled 2021-08-15: qty 2

## 2021-08-15 MED ORDER — SODIUM BICARBONATE 650 MG PO TABS
650.0000 mg | ORAL_TABLET | Freq: Two times a day (BID) | ORAL | Status: DC
Start: 2021-08-15 — End: 2021-08-15
  Administered 2021-08-15: 650 mg via ORAL
  Filled 2021-08-15 (×2): qty 1

## 2021-08-15 MED ORDER — ACETAMINOPHEN 500 MG PO TABS
1000.0000 mg | ORAL_TABLET | Freq: Once | ORAL | Status: AC
  Administered 2021-08-15: 1000 mg via ORAL
  Filled 2021-08-15: qty 2

## 2021-08-15 NOTE — Telephone Encounter (Signed)
Pt's son would like a callback regarding pt being rushed to the hospital yesterday. Son states that one of the test that pt had done revealed that her heart is enlarged. Please advise

## 2021-08-15 NOTE — Telephone Encounter (Signed)
Cardiomegaly (enlarged heart on chest xray) is not a new finding for Abigail Wallace. No surprise and no reason for additional workup or change in meds.  Please tell them I hope she feels better soon.

## 2021-08-15 NOTE — Telephone Encounter (Signed)
Son reports patient was at ED yesterday for sob. She was diagnose with URI and placed on doxycycline. While there, CXR showed mild cardiomegaly. This concerns son and wants to know if patient needs to be seen by Dr. Sallyanne Kuster.

## 2021-08-15 NOTE — ED Provider Notes (Signed)
[facility] DEPT Provider Note   CSN: [id number] Arrival date & time: 08/14/21  1950     History  Chief Complaint  Patient presents with   Shortness of Breath    Abigail Wallace is a 76 y.o. female.  76 y/o female with hx of COPD, HTN, CHB s/p PPM, CHF (EF 40-45%), DM, CKD, psychosis, and tardive dyskinesia presents to the emergency department for upper respiratory symptoms and shortness of breath x3 days.  Patient lives with her son who contracted symptoms similar to COVID 1 week ago.  Patient developed sore throat as well as cough productive of clear phlegm and shortness of breath with headache over the weekend.  She has remained compliant with her rescue inhaler use, but has not been utilizing her nebulizers.  No associated fevers, vomiting, diarrhea, chest pain.  She has not been vaccinated for COVID.  Was last COVID-positive 15 months ago, per family.  The history is provided by the patient and a relative (Son at bedside). No language interpreter was used.  Shortness of Breath      Home Medications Prior to Admission medications   Medication Sig Start Date End Date Taking? Authorizing Provider  doxycycline (VIBRAMYCIN) 100 MG capsule Take 1 capsule (100 mg total) by mouth 2 (two) times daily for 7 days. 08/15/21 08/22/21 Yes Antonietta Breach, PA-C  acetaminophen (TYLENOL) 500 MG tablet Take 1,000 mg by mouth every 6 (six) hours as needed for moderate pain.     [provider]  albuterol (VENTOLIN HFA) 108 (90 Base) MCG/ACT inhaler Inhale 2 puffs into the lungs every 6 (six) hours as needed for wheezing or shortness of breath. 07/12/21   Martyn Malay, MD  allopurinol (ZYLOPRIM) 100 MG tablet Take 1 tablet (100 mg total) by mouth daily. 07/12/21   Martyn Malay, MD  amLODipine (NORVASC) 10 MG tablet Take 10 mg by mouth daily. 12/05/20   [provider]  apixaban (ELIQUIS) 5 MG TABS tablet Take 1 tablet (5 mg total) by mouth 2 (two) times  daily. 07/12/21 07/12/22  Martyn Malay, MD  Blood Glucose Monitoring Suppl [facility] VERIO) w/Device KIT Check blood sugar once daily 07/06/19   Martyn Malay, MD  cholecalciferol (VITAMIN D3) 25 MCG (1000 UNIT) tablet Take 1 tablet (1,000 Units total) by mouth daily. 05/09/21   Martyn Malay, MD  diclofenac Sodium (VOLTAREN) 1 % GEL Apply 2 g topically 4 (four) times daily as needed (pain). 04/25/21   Martyn Malay, MD  divalproex (DEPAKOTE ER) 250 MG 24 hr tablet Take 250 mg by mouth at bedtime.  02/11/18   [provider]  furosemide (LASIX) 20 MG tablet Take 1 tablet for 2 days 06/22/21   Martyn Malay, MD  glucose blood [facility] VERIO) test strip Please use to check blood sugar once daily. E11.9 07/15/20   Martyn Malay, MD  glucose blood test strip Test each morning before breakfast 07/15/19   Martyn Malay, MD  hydrocortisone cream 0.5 % Apply 1 application topically 2 (two) times daily. Apply WITH Nystatin under left breast Patient not taking: Reported on 04/17/2021 11/28/20   Martyn Malay, MD  Incontinence Supplies MISC 1 Units by Does not apply route as needed. 12/13/15   McKeag, Marylynn Pearson, MD  ipratropium-albuterol (DUONEB) 0.5-2.5 (3) MG/3ML SOLN Take 3 mLs by nebulization every 6 (six) hours as needed. Patient not taking: Reported on 04/17/2021 03/18/21   Oswald Hillock, MD  ketoconazole ([place])  2 % cream Apply 1 application. topically daily. 05/09/21   Martyn Malay, MD  Lancet Device MISC 1 Device by Does not apply route 3 (three) times a week. 07/15/19   Martyn Malay, MD  linagliptin (TRADJENTA) 5 MG TABS tablet Take 1 tablet (5 mg total) by mouth daily. 02/13/21   Martyn Malay, MD  metoprolol tartrate (LOPRESSOR) 50 MG tablet Take 1 tablet (50 mg total) by mouth 2 (two) times daily. 05/09/21 05/04/22  Martyn Malay, MD  mupirocin cream (BACTROBAN) 2 % Apply 1 application. topically 2 (two) times daily. 03/24/21   Martyn Malay, MD  nitroGLYCERIN (NITROSTAT) 0.4 MG SL  tablet PLACE ONE TABLET UNDER THE TONGUE EVERY FIVE MINUTES FOR THREE DOSES AS NEEDED FOR CHEST PAIN. CALL 911 AFTER THAT Patient not taking: Reported on 04/17/2021 10/17/20   Croitoru, Mihai, MD  nystatin (MYCOSTATIN/NYSTOP) powder Apply 1 Application topically 3 (three) times daily. 07/03/21   Martyn Malay, MD  nystatin cream (MYCOSTATIN) Apply 1 application  topically 2 (two) times daily. 06/23/21   Martyn Malay, MD  OLANZapine (ZYPREXA) 10 MG tablet Take 10 mg by mouth at bedtime. 06/08/14   [provider]  olmesartan (BENICAR) 5 MG tablet Take 5 mg by mouth every evening.    [provider]  OneTouch Delica Lancets 75O MISC Use to test once daily.  DX code:E11.9 07/15/20   Martyn Malay, MD  pravastatin (PRAVACHOL) 40 MG tablet TAKE ONE TABLET BY MOUTH EVERY EVENING 05/08/21   Croitoru, Mihai, MD  ramelteon (ROZEREM) 8 MG tablet Take 1 tablet (8 mg total) by mouth at bedtime. 08/11/21   Lenoria Chime, MD  senna (SENOKOT) 8.6 MG TABS tablet Take 1 tablet (8.6 mg total) by mouth daily as needed for mild constipation. Patient not taking: Reported on 04/17/2021 04/11/21   Martyn Malay, MD  sodium bicarbonate 650 MG tablet Take 1 tablet (650 mg total) by mouth 2 (two) times daily. 07/12/21   Martyn Malay, MD  umeclidinium-vilanterol [facility] ELLIPTA) 62.5-25 MCG/ACT AEPB Inhale 1 puff into the lungs daily.    [provider]      Allergies    Abilify [aripiprazole], Clozapine, Benadryl [diphenhydramine hcl], Cogentin [benztropine], Haloperidol lactate, Latuda [lurasidone hcl], Lorazepam, Remeron [mirtazapine], Keflex [cephalexin], Codeine, and Latex    Review of Systems   Review of Systems  Respiratory:  Positive for shortness of breath.   Ten systems reviewed and are negative for acute change, except as noted in the HPI.    Physical Exam Updated Vital Signs BP (!) 154/58   Pulse 67   Temp 98.4 F (36.9 C) (Oral)   Resp 20   Ht '5\' 6"'  (1.676 m)   Wt 126.1 kg    SpO2 94%   BMI 44.87 kg/m   Physical Exam Vitals and nursing note reviewed.  Constitutional:      General: She is not in acute distress.    Appearance: She is well-developed. She is not diaphoretic.     Comments: Obese patient.   HENT:     Head: Normocephalic and atraumatic.  Eyes:     General: No scleral icterus.    Conjunctiva/sclera: Conjunctivae normal.  Cardiovascular:     Rate and Rhythm: Normal rate and regular rhythm.     Pulses: Normal pulses.  Pulmonary:     Effort: Pulmonary effort is normal. No respiratory distress.     Comments: Dyspneic without tachypnea.  Sats of 92 to 94%  at rest on room air.  Mild expiratory wheeze scattered throughout both lung fields.  No rales or rhonchi noted. Musculoskeletal:        General: Normal range of motion.     Cervical back: Normal range of motion.  Skin:    General: Skin is warm and dry.     Coloration: Skin is not pale.     Findings: No erythema or rash.  Neurological:     Mental Status: She is alert and oriented to person, place, and time.     Coordination: Coordination normal.  Psychiatric:        Mood and Affect: Mood is anxious.        Behavior: Behavior normal.     ED Results / Procedures / Treatments   Labs (all labs ordered are listed, but only abnormal results are displayed) Labs Reviewed  COMPREHENSIVE METABOLIC PANEL - Abnormal; Notable for the following components:      Result Value   Sodium 134 (*)    CO2 18 (*)    Glucose, Bld 259 (*)    BUN 31 (*)    Creatinine, Ser 1.64 (*)    GFR, Estimated 32 (*)    All other components within normal limits  CBC WITH DIFFERENTIAL/PLATELET - Abnormal; Notable for the following components:   WBC 12.8 (*)    Hemoglobin 11.0 (*)    HCT 35.7 (*)    Neutro Abs 9.5 (*)    Abs Immature Granulocytes 0.13 (*)    All other components within normal limits  RESP PANEL BY RT-PCR (FLU A&B, COVID) ARPGX2  TROPONIN I (HIGH SENSITIVITY)  TROPONIN I (HIGH SENSITIVITY)     EKG EKG Interpretation  Date/Time:  Tuesday August 15 2021 03:12:57 EDT Ventricular Rate:  64 PR Interval:  207 QRS Duration: 186 QT Interval:  507 QTC Calculation: 524 R Axis:   -75 Text Interpretation: Sinus rhythm Nonspecific IVCD with LAD LVH with secondary repolarization abnormality I Confirmed by Randal Buba, April ([id number]) on 08/15/2021 3:51:41 AM  Radiology DG Chest Portable 1 View  Result Date: 08/14/2021 CLINICAL DATA:  Shortness of breath EXAM: PORTABLE CHEST 1 VIEW COMPARISON:  06/21/2021 FINDINGS: Mild cardiomegaly. Unchanged position of pacemaker leads. No focal airspace consolidation or pulmonary edema. IMPRESSION: Mild cardiomegaly without focal airspace disease. Electronically Signed   By: Ulyses Jarred M.D.   On: 08/14/2021 21:08      Procedures Procedures    Medications Ordered in ED Medications  acetaminophen (TYLENOL) tablet 1,000 mg (1,000 mg Oral Given 08/15/21 0141)  albuterol (PROVENTIL) (2.5 MG/3ML) 0.083% nebulizer solution 5 mg (5 mg Nebulization Given 08/15/21 0150)  ipratropium (ATROVENT) nebulizer solution 0.5 mg (0.5 mg Nebulization Given 08/15/21 0150)    ED Course/ Medical Decision Making/ A&P                           Medical Decision Making Risk OTC drugs. Prescription drug management.   This patient presents to the ED for concern of SOB, this involves an extensive number of treatment options, and is a complaint that carries with it a high risk of complications and morbidity.  The differential diagnosis includes PTX vs bronchitis vs PNA vs COVID vs anxiety vs atypical ACS   Co morbidities that complicate the patient evaluation  COPD CHF CHB s/p PPM   Additional history obtained:  Additional history obtained from son at bedside   Lab Tests:  I Ordered, and personally interpreted labs.  The  pertinent results include:  stable CBC and CMP compared to prior. Negative troponin x2. Negative COVID.   Imaging Studies ordered:  I ordered  imaging studies including CXR  I independently visualized and interpreted imaging which showed no focal consolidation or other acute abnormality  I agree with the radiologist interpretation   Cardiac Monitoring:  The patient was maintained on a cardiac monitor.  I personally viewed and interpreted the cardiac monitored which showed an underlying rhythm of: NSR   Medicines ordered and prescription drug management:  I ordered medication including Tylenol for headache and Duoneb for wheezing/COPD Reevaluation of the patient after these medicines showed that the patient improved I have reviewed the patients home medicines and have made adjustments as needed   Test Considered:  BNP   Reevaluation:  After the interventions noted above, I reevaluated the patient and found that they have :improved   Social Determinants of Health:  Good social support, lives with son   Dispostion:  After consideration of the diagnostic results and the patients response to treatment, I feel that the patent would benefit from a course of doxycycline given her history of COPD, acute upper respiratory symptoms. Otherwise stable on room air. Appropriate for PCP follow up. Return precautions discussed and provided. Patient discharged in stable condition with no unaddressed concerns.         Final Clinical Impression(s) / ED Diagnoses Final diagnoses:  SOB (shortness of breath)    Rx / DC Orders ED Discharge Orders          Ordered    doxycycline (VIBRAMYCIN) 100 MG capsule  2 times daily        08/15/21 0420              Antonietta Breach, PA-C 08/22/21 Duchesne, April, MD 08/22/21 [phone]

## 2021-08-15 NOTE — Telephone Encounter (Signed)
Called son about questions. Mom is doing well with breathing--reviewed cardiomegaly, has been present, do over pulmonary edema, reviewed limitations of 1 view CXR. All questions answered. Will message in AM.  [name], MD  [facility] Medicine Teaching Service

## 2021-08-15 NOTE — Discharge Instructions (Addendum)
Take doxycycline as prescribed until finished.  We recommend use of albuterol nebulizers every 6 hours for management of shortness of breath.  Continue your other daily prescribed medications.  Follow-up with your primary care doctor by the end of the week.

## 2021-08-15 NOTE — Telephone Encounter (Signed)
LMTCB regarding reply from Dr. Sallyanne Kuster.

## 2021-08-15 NOTE — ED Notes (Signed)
EKG from arrival not showing in chart - new one obtained and exported.

## 2021-08-15 NOTE — ED Notes (Signed)
Patient's oxygen maintained at 94% on room air during transfer to wheelchair.

## 2021-08-16 NOTE — Telephone Encounter (Signed)
Left message to call back if anything further was needed.

## 2021-08-29 ENCOUNTER — Other Ambulatory Visit: Payer: Self-pay

## 2021-08-29 DIAGNOSIS — E1165 Type 2 diabetes mellitus with hyperglycemia: Secondary | ICD-10-CM

## 2021-09-04 ENCOUNTER — Other Ambulatory Visit: Payer: Self-pay

## 2021-09-04 DIAGNOSIS — E1165 Type 2 diabetes mellitus with hyperglycemia: Secondary | ICD-10-CM

## 2021-09-04 MED ORDER — ONETOUCH VERIO VI STRP: ORAL_STRIP | 12 refills | Status: DC

## 2021-09-05 MED ORDER — ONETOUCH VERIO VI STRP: ORAL_STRIP | 12 refills | Status: DC

## 2021-09-05 NOTE — Telephone Encounter (Signed)
Patient calls nurse line in regards to testing strips.   [place] does not carry her strips.   Prescription resent to [facility] in [place] per patient request.

## 2021-09-05 NOTE — Addendum Note (Signed)
Addended by: Dorna Bloom on: 09/05/2021 03:37 PM   Modules accepted: Orders

## 2021-09-14 DIAGNOSIS — I951 Orthostatic hypotension: Secondary | ICD-10-CM | POA: Diagnosis not present

## 2021-09-14 DIAGNOSIS — M179 Osteoarthritis of knee, unspecified: Secondary | ICD-10-CM | POA: Diagnosis not present

## 2021-09-14 DIAGNOSIS — J449 Chronic obstructive pulmonary disease, unspecified: Secondary | ICD-10-CM | POA: Diagnosis not present

## 2021-09-14 MED ORDER — IPRATROPIUM-ALBUTEROL 0.5-2.5 (3) MG/3ML IN SOLN: 3.0000 mL | Freq: Four times a day (QID) | RESPIRATORY_TRACT | 3 refills | Status: DC | PRN

## 2021-09-14 NOTE — Addendum Note (Signed)
Addended by: [name], [place] on: 09/14/2021 08:54 AM   Modules accepted: Orders

## 2021-09-20 ENCOUNTER — Telehealth: Payer: Self-pay

## 2021-09-20 DIAGNOSIS — N3000 Acute cystitis without hematuria: Secondary | ICD-10-CM

## 2021-09-20 MED ORDER — AMOXICILLIN 500 MG PO CAPS: 500.0000 mg | ORAL_CAPSULE | Freq: Two times a day (BID) | ORAL | 0 refills | Status: DC

## 2021-09-20 NOTE — Telephone Encounter (Signed)
Called son and patient. Patient has had frequency and burning. Last treatment for UTI >8 months ago. Allergic to cephalexin, reviewed urine culture, sensitive to amoxicillin. Rx for amoxicillin.  Discussed behaviors of patient at length--numerous family stressors ongoing. BP and BG have been good. They have contacted Psychiatry about her mood changes.  All questions answered. They will call if she does not improve.  [name], MD  Family Medicine Teaching Service

## 2021-09-20 NOTE — Telephone Encounter (Signed)
Patient LVM on nurse line reporting "salt" in nebulizer solution and asking for a call back.   Patient phone call returned, however spoke with [name].   [name] reports she is convinced the solution is "filled with nothing but salt." [name] reports her moods have changed over the last few days. [name] reports he feels this is due to the UTI she has.   Reports abnormal color and odor for the last few days. He performed an Azo test and result was positive yesterday and this morning. Reports increased frequency. Denies fevers.   Patient has an apt on 9/15 with PCP. [name] is asking for treatment in the meantime and recheck urine at apt.   ED precautions given.   Will forward to PCP.

## 2021-09-21 DIAGNOSIS — H2589 Other age-related cataract: Secondary | ICD-10-CM | POA: Diagnosis not present

## 2021-09-21 DIAGNOSIS — H2513 Age-related nuclear cataract, bilateral: Secondary | ICD-10-CM | POA: Diagnosis not present

## 2021-09-21 DIAGNOSIS — E1136 Type 2 diabetes mellitus with diabetic cataract: Secondary | ICD-10-CM | POA: Diagnosis not present

## 2021-09-28 ENCOUNTER — Encounter: Payer: Self-pay | Admitting: Family Medicine

## 2021-09-28 LAB — HM DIABETES EYE EXAM

## 2021-09-29 ENCOUNTER — Ambulatory Visit (INDEPENDENT_AMBULATORY_CARE_PROVIDER_SITE_OTHER): Payer: Medicare Other | Admitting: Family Medicine

## 2021-09-29 ENCOUNTER — Encounter: Payer: Self-pay | Admitting: Family Medicine

## 2021-09-29 ENCOUNTER — Other Ambulatory Visit: Payer: Self-pay

## 2021-09-29 VITALS — BP 130/70 | HR 72

## 2021-09-29 DIAGNOSIS — I480 Paroxysmal atrial fibrillation: Secondary | ICD-10-CM

## 2021-09-29 DIAGNOSIS — N8110 Cystocele, unspecified: Secondary | ICD-10-CM | POA: Diagnosis not present

## 2021-09-29 DIAGNOSIS — H269 Unspecified cataract: Secondary | ICD-10-CM | POA: Diagnosis not present

## 2021-09-29 DIAGNOSIS — Z7189 Other specified counseling: Secondary | ICD-10-CM

## 2021-09-29 DIAGNOSIS — I5042 Chronic combined systolic (congestive) and diastolic (congestive) heart failure: Secondary | ICD-10-CM

## 2021-09-29 DIAGNOSIS — E119 Type 2 diabetes mellitus without complications: Secondary | ICD-10-CM

## 2021-09-29 DIAGNOSIS — R300 Dysuria: Secondary | ICD-10-CM

## 2021-09-29 DIAGNOSIS — F200 Paranoid schizophrenia: Secondary | ICD-10-CM

## 2021-09-29 DIAGNOSIS — H60312 Diffuse otitis externa, left ear: Secondary | ICD-10-CM | POA: Diagnosis not present

## 2021-09-29 HISTORY — DX: Other specified counseling: Z71.89

## 2021-09-29 LAB — POCT URINALYSIS DIP (MANUAL ENTRY)
Bilirubin, UA: NEGATIVE
Glucose, UA: 100 mg/dL — AB
Ketones, POC UA: NEGATIVE mg/dL
Leukocytes, UA: NEGATIVE
Nitrite, UA: NEGATIVE
Protein Ur, POC: >=300 mg/dL — AB
Spec Grav, UA: 1.015 (ref 1.010–1.025)
Urobilinogen, UA: 0.2 E.U./dL
pH, UA: 6 (ref 5.0–8.0)

## 2021-09-29 LAB — POCT UA - MICROSCOPIC ONLY
RBC, Urine, Miroscopic: NONE SEEN (ref 0–2)
WBC, Ur, HPF, POC: NONE SEEN (ref 0–5)

## 2021-09-29 LAB — POCT GLYCOSYLATED HEMOGLOBIN (HGB A1C): HbA1c, POC (controlled diabetic range): 6.4 % (ref 0.0–7.0)

## 2021-09-29 LAB — POCT URINE PREGNANCY: Preg Test, Ur: NEGATIVE

## 2021-09-29 MED ORDER — CIPROFLOXACIN-DEXAMETHASONE 0.3-0.1 % OT SUSP: 4.0000 [drp] | Freq: Two times a day (BID) | OTIC | 0 refills | Status: AC

## 2021-09-29 NOTE — Patient Instructions (Addendum)
It was wonderful to see you today.  Please bring ALL of your medications with you to every visit.   Today we talked about:  For your ears  For your urine/sensation in your body - We will recheck your urine  I recommend taking ramelteon each night   For your breathing - NO smoking - We will check your heart marker  Please follow up in 2 months   Thank you for choosing [facility].   Please call [phone] with any questions about today's appointment.  Please be sure to schedule follow up at the front  desk before you leave today.   [name], MD  Family Medicine

## 2021-09-29 NOTE — Assessment & Plan Note (Signed)
Patient has bilateral cataracts and has been recommended undergo surgery.  This is a low risk surgery.  However given the patient's comorbidities she is at elevated but appropriate risk (RCRI risk >10%).  If she elects to pursue surgical intervention I recommended that we discuss with her cardiologist specific recommendations for her apixaban which she will need to hold 24 to 48 hours prior to cataract surgery. - Will need EKG prior to OR  - Continue to address and if elects to proceed will discuss with Dr. Loletha Grayer.

## 2021-09-29 NOTE — Assessment & Plan Note (Addendum)
Has known vaginal prolapse,, suspect this may be the cause of the sensation she has when she walks.  Encouraged continued ambulation with a walker.

## 2021-09-29 NOTE — Assessment & Plan Note (Signed)
Lung exam is reassuring today.  No signs of volume overload.  We will check CMP and BNP.  Continue current therapies. Recommendations for continuing inhalers if she does not wish to use the nebulizers.

## 2021-09-29 NOTE — Assessment & Plan Note (Addendum)
Worsening, she has follow up psychiatry next month.  Discussed that it is impossible the pure wick is in her abdomen.  Also reassured her with negative pregnancy test today.  Patient may benefit from as needed medication.  Reminder to self to call her psychiatrist when they are open next week to discuss.

## 2021-09-29 NOTE — Progress Notes (Signed)
SUBJECTIVE:   CHIEF COMPLAINT: breathing and sensation in pelvis HPI:   Abigail Wallace is a 76 y.o.  with history notable for type 2 diabetes, atrial fibrillation on apixaban, obstructive lung disease, tobacco abuse (resolved) and mood disorder presenting for follow-up.  She is joined by her son.  She is several concerns today.  The patient brings with her a log of her blood sugars most values range from 140-1 60 in the morning.  A 30-day average is 143.  Her A1c today is improved from prior.  She denies polyuria polydipsia.  She is compliant with her linagliptin.  The patient reports compliance with her antihypertensive pills.  Her blood pressures at home are variable and ranged from 130s over 60s to 180s over 80s.  She denies headaches, chest pain or vision changes.  Her blood pressure today is at goal and she is compliant with her medications.  The patient's major concern today is her left ear.  She reports she has foul-smelling discharge.  She sometimes reports a change in her hearing.  She denies pain in her ear.  She reports her right ear is okay.  She uses Q-tips regularly.  No fevers or neck pain.  The patient has been concerned for sometimes that there is "something up in her".  She relates this to use of a pure wick while she was in the hospital.  In delving into this more the patient actually is concerned that she has a fetus inside of her and if she walks too much the cord will prolapse.  The patient reports intermittent dyspnea.  This happens when she is worried.  She denies chest pain, wheezing, cough.  She is no longer smoking.  She does not like to use her nebulizers due to concerns for the salt in them.  The patient lives at home at home with her son who is her caregiver.  She requires assistance with all activities of daily living specifically transferring, bathing, showering, toileting and meals.  Her son lives with her 24/7.  He is feeling somewhat overwhelmed and would  like to be able to go back to work part-time.  They are interested in personal care services.  Briefly discussed advance care planning with patient and her son today.   PERTINENT  PMH / PSH/Family/Social History : updated and reviewed   OBJECTIVE:   BP 130/70   Pulse 72   SpO2 99%   Today's weight:  Review of prior weights: There were no vitals filed for this visit.  LEAC with + cerumen and white foul smelling discharge R EAC clear without cerumen  Cardiac: Regular rate and rhythm. Normal S1/S2. No murmurs, rubs, or gallops appreciated. Lungs: Clear bilaterally to ascultation.  Psych: tangential today    ASSESSMENT/PLAN:   Vaginal prolapse Has known vaginal prolapse,, suspect this may be the cause of the sensation she has when she walks.  Encouraged continued ambulation with a walker.  Cataract Patient has bilateral cataracts and has been recommended undergo surgery.  This is a low risk surgery.  However given the patient's comorbidities she is at elevated but appropriate risk (RCRI risk >10%).  If she elects to pursue surgical intervention I recommended that we discuss with her cardiologist specific recommendations for her apixaban which she will need to hold 24 to 48 hours prior to cataract surgery. - Will need EKG prior to OR  - Continue to address and if elects to proceed will discuss with Dr. Loletha Grayer.   Paranoid  schizophrenia ([place]) Worsening, she has follow up psychiatry next month.  Discussed that it is impossible the pure wick is in her abdomen.  Also reassured her with negative pregnancy test today.  Patient may benefit from as needed medication.  Reminder to self to call her psychiatrist when they are open next week to discuss.  Well controlled type 2 diabetes mellitus ([place]) Blood glucose is improved now that she is off the steroids and doing better with her eating.  Continue current medications.UACR today.   Advanced care planning/counseling discussion Discussed with  patient and son today.  Advanced directive packet given to patient.  From previous discussions the patient has made it clear that she would want everything done including feeding tube ventilation and aggressive interventions to keep her alive.  She thinks "it is wrong to disconnect someone from the tube".  She would like her son Abigail Wallace to be her decision-maker should she be unable to make decisions for herself.  Chronic combined systolic and diastolic heart failure ([place]) Lung exam is reassuring today.  No signs of volume overload.  We will check CMP and BNP.  Continue current therapies. Recommendations for continuing inhalers if she does not wish to use the nebulizers.   Home Safety  History of atrial fibrillation, polio, heart failure and COPD all contributing to patient's overall physical status and dependence on wheelchair. I significant concerns for safety if she is left alone as she is a very high fall risk.  She cannot do transfers easily by herself. Discussed referral to chronic care management team with the patient and her son.  They agreed to such referral.  I let them know that some will be reaching out to schedule.        [name], [place]

## 2021-09-29 NOTE — Assessment & Plan Note (Signed)
Blood glucose is improved now that she is off the steroids and doing better with her eating.  Continue current medications.UACR today.

## 2021-09-29 NOTE — Assessment & Plan Note (Signed)
Discussed with patient and son today.  Advanced directive packet given to patient.  From previous discussions the patient has made it clear that she would want everything done including feeding tube ventilation and aggressive interventions to keep her alive.  She thinks "it is wrong to disconnect someone from the tube".  She would like her son Abigail Wallace to be her decision-maker should she be unable to make decisions for herself.

## 2021-10-01 LAB — COMPREHENSIVE METABOLIC PANEL
ALT: 10 IU/L (ref 0–32)
AST: 14 IU/L (ref 0–40)
Albumin/Globulin Ratio: 1.6 (ref 1.2–2.2)
Albumin: 4.2 g/dL (ref 3.8–4.8)
Alkaline Phosphatase: 85 IU/L (ref 44–121)
BUN/Creatinine Ratio: 26 (ref 12–28)
BUN: 38 mg/dL — ABNORMAL HIGH (ref 8–27)
Bilirubin Total: <0.2 mg/dL (ref 0.0–1.2)
CO2: 16 mmol/L — ABNORMAL LOW (ref 20–29)
Calcium: 9.6 mg/dL (ref 8.7–10.3)
Chloride: 104 mmol/L (ref 96–106)
Creatinine, Ser: 1.46 mg/dL — ABNORMAL HIGH (ref 0.57–1.00)
Globulin, Total: 2.6 g/dL (ref 1.5–4.5)
Glucose: 123 mg/dL — ABNORMAL HIGH (ref 70–99)
Potassium: 5 mmol/L (ref 3.5–5.2)
Sodium: 138 mmol/L (ref 134–144)
Total Protein: 6.8 g/dL (ref 6.0–8.5)
eGFR: 37 mL/min/{1.73_m2} — ABNORMAL LOW (ref >59)

## 2021-10-01 LAB — MICROALBUMIN / CREATININE URINE RATIO
Creatinine, Urine: 31.3 mg/dL
Microalb/Creat Ratio: 2574 mg/g creat — ABNORMAL HIGH (ref 0–29)
Microalbumin, Urine: 805.7 ug/mL

## 2021-10-01 LAB — BRAIN NATRIURETIC PEPTIDE: BNP: 128 pg/mL — ABNORMAL HIGH (ref 0.0–100.0)

## 2021-10-02 ENCOUNTER — Ambulatory Visit (INDEPENDENT_AMBULATORY_CARE_PROVIDER_SITE_OTHER): Payer: Medicare Other

## 2021-10-02 ENCOUNTER — Telehealth: Payer: Self-pay | Admitting: Family Medicine

## 2021-10-02 ENCOUNTER — Telehealth: Payer: Self-pay

## 2021-10-02 DIAGNOSIS — I4819 Other persistent atrial fibrillation: Secondary | ICD-10-CM

## 2021-10-02 NOTE — Telephone Encounter (Signed)
Called and spoke with son Selena Lesser. BNP similar to prior, UAC similar to prior. BMP appropriate. Patient has had reduced anxiety and improved symptoms over weekend. Will discuss medication adjustments with [name] NP. Discussed Geriatric clinic with son. All questions answered related to labs. Reviewed return precautions. Awaiting call from NP hughes.   [name], MD  Family Medicine Teaching Service

## 2021-10-02 NOTE — Telephone Encounter (Signed)
Attempted to call [facility] [name] re: concerns about significant anxiety/paranoia. Left voicemail to call back to discuss. If she calls back Please identify the best number to reach her Please ask the best times of day to reach her Abigail Singh, MD  [facility] Medicine Teaching Service

## 2021-10-02 NOTE — Telephone Encounter (Signed)
Patient LVM on nurse line reporting the ciprodex that was called in is leaving a "salt taste" in her mouth and is requesting something orally.  I attempted to call her back, however spoke with [name].   [name] reports like last time she is convinced everything has salt in it.   [name] stated to disregard her request for oral antibiotics. And states it will be a waste of money and resources.   [name] will continue to monitor and call with any questions or concerns.   Will forward to PCP.

## 2021-10-02 NOTE — Telephone Encounter (Signed)
Agree can continue topical antibiotics.  [name], MD  Family Medicine Teaching Service

## 2021-10-03 ENCOUNTER — Telehealth: Payer: Self-pay | Admitting: *Deleted

## 2021-10-03 LAB — CUP PACEART REMOTE DEVICE CHECK
Battery Impedance: 1056 Ohm
Battery Remaining Longevity: 52 mo
Battery Voltage: 2.77 V
Brady Statistic AP VP Percent: 71 %
Brady Statistic AP VS Percent: 0 %
Brady Statistic AS VP Percent: 29 %
Brady Statistic AS VS Percent: 0 %
Date Time Interrogation Session: 20230918064540
Implantable Lead Implant Date: 20070414
Implantable Lead Implant Date: 20070914
Implantable Lead Location: 753859
Implantable Lead Location: 753860
Implantable Lead Model: 4092
Implantable Lead Model: 5594
Implantable Pulse Generator Implant Date: 20160816
Lead Channel Impedance Value: 480 Ohm
Lead Channel Impedance Value: 602 Ohm
Lead Channel Pacing Threshold Amplitude: 0.75 V
Lead Channel Pacing Threshold Amplitude: 0.875 V
Lead Channel Pacing Threshold Pulse Width: 0.4 ms
Lead Channel Pacing Threshold Pulse Width: 0.4 ms
Lead Channel Setting Pacing Amplitude: 2 V
Lead Channel Setting Pacing Amplitude: 2.5 V
Lead Channel Setting Pacing Pulse Width: 0.4 ms
Lead Channel Setting Sensing Sensitivity: 4 mV

## 2021-10-03 LAB — URINE CULTURE

## 2021-10-03 NOTE — Chronic Care Management (AMB) (Signed)
  Care Coordination   Note   10/03/2021 Name: Abigail Wallace MRN: [id number] DOB: 02/18/1945  Abigail Wallace is a 76 y.o. year old female who sees Martyn Malay, MD for primary care. I reached out to [facility] by phone today to offer care coordination services.  Abigail Wallace was given information about Care Coordination services today including:   The Care Coordination services include support from the care team which includes your Nurse Coordinator, Clinical Social Worker, or Pharmacist.  The Care Coordination team is here to help remove barriers to the health concerns and goals most important to you. Care Coordination services are voluntary, and the patient may decline or stop services at any time by request to their care team member.   Care Coordination Consent Status: Patient agreed to services and verbal consent obtained.   Follow up plan:  Telephone appointment with care coordination team member scheduled for:  SW 10/06/21 and [facility] 10/13/21  Encounter Outcome:  Pt. Scheduled  [place]  Direct Dial: [phone]

## 2021-10-06 ENCOUNTER — Ambulatory Visit: Payer: Self-pay | Admitting: Licensed Clinical Social Worker

## 2021-10-06 ENCOUNTER — Telehealth: Payer: Self-pay | Admitting: Family Medicine

## 2021-10-06 NOTE — Patient Outreach (Signed)
  Care Coordination   Initial Visit Note   10/06/2021 Name: Abigail Wallace MRN: [id number] DOB: 20-Oct-1945  Abigail Wallace is a 76 y.o. year old female who sees Martyn Malay, MD for primary care. I spoke with  [facility] by phone today.  What matters to the patients health and wellness today?  Housing     Goals Addressed               This Visit's Progress     Care Coordination Activities (pt-stated)        Patient concerned of losing HUD benefits if patients care giver starts working and receives [employer] service.  Patient has reached out to HUD and awaiting a response.   Care Coordination Interventions: Provided education to patient re: Housing and HUD  Solution-Focused Strategies employed:  Emotional Support Provided Problem [place] strategies reviewed         SDOH assessments and interventions completed:  Yes     Care Coordination Interventions Activated:  Yes  Care Coordination Interventions:  Yes, provided   Follow up plan: No further intervention required.   Encounter Outcome:  Pt. Visit Completed   Lenor Derrick , MSW Social Worker [facility] Care Management  [phone]

## 2021-10-06 NOTE — Telephone Encounter (Signed)
Patient's son dropped off home healthcare form to be completed. Last DOS was 09/29/21. Placed in [employer].

## 2021-10-09 NOTE — Telephone Encounter (Signed)
Reviewed form and placed in PCP's box for completion.  .Fidencia Mccloud R Athalee Esterline, CMA  

## 2021-10-09 NOTE — Telephone Encounter (Signed)
Reviewed, completed, and signed form.  Note routed to RN team inbasket and placed completed form in RN Wall pocket in the front office.  Lalana Wachter M Samirah Scarpati, MD  

## 2021-10-10 NOTE — Telephone Encounter (Signed)
Patient's son completed ROI to fax and mail to property management.   Faxed to provided number and placed copy in outgoing mail. Copy made and placed in batch scanning.   Made son aware that paperwork has been completed.   Talbot Grumbling, RN

## 2021-10-13 ENCOUNTER — Telehealth: Payer: Self-pay | Admitting: *Deleted

## 2021-10-13 NOTE — Chronic Care Management (AMB) (Signed)
  Care Coordination   Note   10/13/2021 Name: Abigail Wallace MRN: [id number] DOB: 10-10-1945  Abigail Wallace is a 76 y.o. year old female who sees Martyn Malay, MD for primary care. I reached out to [facility] by phone today to reschedule care coordination services.   Care Coordination Consent Status: Patient son Abigail Wallace did not agree to continue care coordination services at this time.    Encounter Outcome:  Pt. Refused  [place]  Direct Dial: [phone]

## 2021-10-13 NOTE — Chronic Care Management (AMB) (Signed)
  Care Coordination   Note   10/13/2021 Name: Abigail Wallace MRN: [id number] DOB: 1945-05-23  Abigail Wallace is a 76 y.o. year old female who sees Martyn Malay, MD for primary care. I reached out to [facility] by phone today to reschedule for  care coordination services.   Follow up plan:  Unsuccessful telephone outreach attempt made. A HIPAA compliant phone message was left for the patient providing contact information and requesting a return call.  Encounter Outcome:  No Answer  [place]  Direct Dial: [phone]

## 2021-10-17 NOTE — Progress Notes (Signed)
Remote pacemaker transmission.   

## 2021-10-19 ENCOUNTER — Ambulatory Visit: Payer: Medicare Other

## 2021-10-19 ENCOUNTER — Telehealth: Payer: Self-pay

## 2021-10-19 NOTE — Telephone Encounter (Signed)
Called son and discussed. Will reach out to NP [facility] again regarding medications.  [name], MD  Family Medicine Teaching Service

## 2021-10-19 NOTE — Telephone Encounter (Signed)
Patient calls nurse line reporting she has not slept in 5 days.   Patient wanted me to reach out to PCP to ask about Lorazepam. She reports her sister takes '2mg'$  at bedtime to help with insomnia.   Will forward to PCP.

## 2021-10-19 NOTE — Telephone Encounter (Addendum)
Patients son calls nurse line in regards to declining behavioral health.   He reports at last visit PCP was going to reach out to behavioral health provider to discuss.   He reports the call was to discuss medications and ultimately something to help her sleep.   He reports she is under the care of [name] at [place].   Will forward to PCP.

## 2021-10-20 NOTE — Telephone Encounter (Signed)
Called, discussed risks of this medication. Direct number for Abigail Wallace [phone] Ext [phone]. Attempted this number, unable to reach anyone.  Abigail Singh, MD  Family Medicine Teaching Service

## 2021-10-20 NOTE — Telephone Encounter (Signed)
Called number provided for Abigail Wallace, left message.  [name], MD  Family Medicine Teaching Service

## 2021-11-09 ENCOUNTER — Other Ambulatory Visit: Payer: Self-pay | Admitting: Family Medicine

## 2021-11-09 ENCOUNTER — Encounter: Payer: Self-pay | Admitting: Family Medicine

## 2021-11-09 DIAGNOSIS — N950 Postmenopausal bleeding: Secondary | ICD-10-CM

## 2021-11-09 DIAGNOSIS — R300 Dysuria: Secondary | ICD-10-CM

## 2021-11-10 ENCOUNTER — Telehealth: Payer: Self-pay

## 2021-11-10 ENCOUNTER — Ambulatory Visit: Payer: Medicare Other | Admitting: Podiatry

## 2021-11-10 ENCOUNTER — Telehealth: Payer: Self-pay | Admitting: Cardiovascular Disease

## 2021-11-10 MED ORDER — DULOXETINE HCL 20 MG PO CPEP: 20.0000 mg | ORAL_CAPSULE | Freq: Every day | ORAL | 3 refills | Status: DC

## 2021-11-10 NOTE — Telephone Encounter (Signed)
Should be fine. Cymbalta can have a minor interaction with metoprolol (boosting the effects of metoprolol). Let us know if BP is lower.

## 2021-11-10 NOTE — Telephone Encounter (Signed)
Returned call to patients son (okay per DPR) and advised him of Dr. Lurline Del recommendations. Patients son verbalized understanding.

## 2021-11-10 NOTE — Telephone Encounter (Signed)
Pt c/o medication issue:  1. Name of Medication: Cymbalta   2. How are you currently taking this medication (dosage and times per day)? Not sure   3. Are you having a reaction (difficulty breathing--STAT)? no  4. What is your medication issue? Pt son states that pt psychiatrist wants to giver her Cymbalta and he wants to make sure this is safe to take with her other medication

## 2021-11-10 NOTE — Telephone Encounter (Signed)
Patients son calls nurse line in regards to medication changes.   [name] reports her Psychiatrist took her off Olanzapine and put her on Cymbalta.   [name] would like to make PCP aware and requests her opinion on change.   Will forward to PCP.

## 2021-11-10 NOTE — Telephone Encounter (Signed)
Noted and medication list updated  [name], MD  [facility] Medicine Teaching Service

## 2021-11-10 NOTE — Telephone Encounter (Signed)
Returned call to patients son (okay per DPR) who states that patient is being switched from Olanzapine to Cymbalta and wanted to check with Dr. Loletha Grayer to ensure there are no contraindications or issues cardiac/cardiac med wise. Patient's son would like to check with Dr. Loletha Grayer only. Advised him that I will forward message over to him for him to review and advise. Patients son verbalized understanding.

## 2021-11-13 ENCOUNTER — Other Ambulatory Visit: Payer: Self-pay | Admitting: *Deleted

## 2021-11-13 MED ORDER — AMLODIPINE BESYLATE 10 MG PO TABS: 10.0000 mg | ORAL_TABLET | Freq: Every day | ORAL | 3 refills | Status: DC

## 2021-11-14 ENCOUNTER — Ambulatory Visit: Payer: Medicare Other | Admitting: Podiatry

## 2021-11-23 ENCOUNTER — Telehealth: Payer: Self-pay

## 2021-11-23 NOTE — Telephone Encounter (Signed)
LVM for patient's son Georgena Spurling relaying message from Dr. [name].  Ozella Almond, [place]

## 2021-11-23 NOTE — Telephone Encounter (Signed)
Please advise family that completely fine to defer family medicine appointment--[name] is due for labs in December/January.  [name], MD  Family Medicine Teaching Service

## 2021-11-23 NOTE — Telephone Encounter (Signed)
Abigail Wallace on nurse line in regards to PCP apt next week.   He reports she has an apt with PCP scheduled for 11/13 and GYN apt scheduled for 11/15.  He does not want to take her out twice. He is requesting to cancel apt with PCP as he feels GYN is more important right now. He would like to bring her by after GYN apt to have any necessary labs drawn here at [facility].  Will forward to PCP.

## 2021-11-27 ENCOUNTER — Ambulatory Visit: Payer: Medicare Other | Admitting: Family Medicine

## 2021-11-29 ENCOUNTER — Encounter: Payer: Self-pay | Admitting: Obstetrics & Gynecology

## 2021-11-29 ENCOUNTER — Ambulatory Visit (INDEPENDENT_AMBULATORY_CARE_PROVIDER_SITE_OTHER): Payer: Medicare Other | Admitting: Obstetrics & Gynecology

## 2021-11-29 VITALS — BP 120/78 | HR 73

## 2021-11-29 DIAGNOSIS — R58 Hemorrhage, not elsewhere classified: Secondary | ICD-10-CM | POA: Diagnosis not present

## 2021-11-29 DIAGNOSIS — R809 Proteinuria, unspecified: Secondary | ICD-10-CM | POA: Diagnosis not present

## 2021-11-29 DIAGNOSIS — N952 Postmenopausal atrophic vaginitis: Secondary | ICD-10-CM

## 2021-11-29 DIAGNOSIS — N939 Abnormal uterine and vaginal bleeding, unspecified: Secondary | ICD-10-CM

## 2021-11-29 LAB — URINALYSIS, COMPLETE W/RFL CULTURE
Bacteria, UA: NONE SEEN /HPF
Bilirubin Urine: NEGATIVE
Casts: NONE SEEN /LPF
Crystals: NONE SEEN /HPF
Glucose, UA: NEGATIVE
Hgb urine dipstick: NEGATIVE
Hyaline Cast: NONE SEEN /LPF
Ketones, ur: NEGATIVE
Leukocyte Esterase: NEGATIVE
Nitrites, Initial: NEGATIVE
RBC / HPF: NONE SEEN /HPF (ref 0–2)
Specific Gravity, Urine: 1.012 (ref 1.001–1.035)
WBC, UA: NONE SEEN /HPF (ref 0–5)
Yeast: NONE SEEN /HPF
pH: 6 (ref 5.0–8.0)

## 2021-11-29 LAB — NO CULTURE INDICATED

## 2021-11-29 NOTE — Progress Notes (Signed)
    [place] 1945/09/09 [id number]        76 y.o.  G4P4L3  Accompanied by her son who is her care giver.  RP: Brownish/pink Vaginal discharge  HPI: Postmenopause, well on no HRT.  Patient's son and care giver noticed that her diaper was tinged with a brownish/pink discharge after shower recently.  Using a cloth to clean the vulva.  No UTI Sx.  BMs wnl.   OB History  Gravida Para Term Preterm AB Living  '4 4 4     3  '$ SAB IAB Ectopic Multiple Live Births          4    # Outcome Date GA Lbr Len/2nd Weight Sex Delivery Anes PTL Lv  4 Term           3 Term           2 Term           1 Term             Past medical history,surgical history, problem list, medications, allergies, family history and social history were all reviewed and documented in the EPIC chart.   Directed ROS with pertinent positives and negatives documented in the history of present illness/assessment and plan.  Exam:  Vitals:   11/29/21 1553  BP: 120/78  Pulse: 73  SpO2: 98%   General appearance:  Normal  Abdomen: Large Rt lower abdominal hernia.  Gynecologic exam: Vulva with atrophy of menopause, very thin with small veins visible.  No active bleeding.  No discrete lesion.  No acute vulvitis.  Speculum:  Cervix/Vagina normal.  No blood or discharge.  Bimanual exam:  Uterus AV, mobile, normal.  No adnexal mass, NT.  U/A: Cloudy, Protein 3+, otherwise completely Negative   Assessment/Plan:  76 y.o. G4P4003   1. Vaginal bleeding Postmenopause, well on no HRT.  Patient's son and care giver noticed that her diaper was tinged with a brownish/pink discharge after shower recently.  Using a cloth to clean the vulva.  No UTI Sx.  BMs wnl.  Thin skin at the vulva, but no current lesion/inflammation.  Gyn exam otherwise Negative.  Counseling done on vulvar care.  Will stop using a wash cloth and just rinse with warm water using the shower head.   - Urinalysis,Complete w/RFL Culture  2. Postmenopausal  atrophic vaginitis Very gentle care of the vulva recommended.  May take Probiotics or use Boric Acid to restore the vaginal PH.  3. Proteinuria, unspecified type U/A Neg except Proteinuria at 3+.  CKD stage 3.  Other orders - OLANZAPINE PO; Take by mouth.   [name] MD, 4:06 PM 11/29/2021

## 2021-12-11 ENCOUNTER — Telehealth: Payer: Self-pay

## 2021-12-11 ENCOUNTER — Telehealth: Payer: Self-pay | Admitting: Family Medicine

## 2021-12-11 ENCOUNTER — Encounter: Payer: Self-pay | Admitting: Family Medicine

## 2021-12-11 NOTE — Telephone Encounter (Signed)
Called patient's Psychiatrist, [name] NP. Left voicemail (for pharmacy coordinator?) to discuss concerns. Left generic voicemail to return call.  [name], MD  Family Medicine Teaching Service

## 2021-12-11 NOTE — Telephone Encounter (Signed)
Called son. Discussed options. Mother is eating, drinking, taking cardiac medications. Discussed going to [facility]. Reviewed reasons to call [employer] for patient's welfare.  [name] also requests new Advance Directive be mailed to home. Routing to [employer] to Brewing technologist.   [name], MD  Family Medicine Teaching Service

## 2021-12-11 NOTE — Telephone Encounter (Signed)
Patient's son calls nurse line requesting to speak with Dr. [name] regarding patient's mental health.   He reports that she has not taken her psychiatric medication since Saturday evening and is refusing to take them at this time. He reports that she is having increased episodes of paranoia and decreased appetite. He has attempted to reach psychiatrist Jobie Quaker), however, has not been able to speak with anyone.   He states that he has someone with her at all times and that she has not voiced thoughts of self harm.   He is asking provider advice for next steps. Provided with ED/ [employer].   Requesting returned call from Dr. [name] at [phone].  Talbot Grumbling, RN

## 2021-12-12 DIAGNOSIS — J449 Chronic obstructive pulmonary disease, unspecified: Secondary | ICD-10-CM | POA: Diagnosis not present

## 2021-12-12 DIAGNOSIS — I951 Orthostatic hypotension: Secondary | ICD-10-CM | POA: Diagnosis not present

## 2021-12-12 DIAGNOSIS — M179 Osteoarthritis of knee, unspecified: Secondary | ICD-10-CM | POA: Diagnosis not present

## 2021-12-14 ENCOUNTER — Telehealth: Payer: Self-pay | Admitting: Family Medicine

## 2021-12-14 NOTE — Telephone Encounter (Signed)
Patient got summoned for Jury Duty and patient is calling requesting a doctors note stating she cannot attend. Please let patient know when letter is ready.

## 2021-12-15 ENCOUNTER — Telehealth: Payer: Self-pay

## 2021-12-15 NOTE — Telephone Encounter (Signed)
Patient's son calls nurse line. Advised that letter was ready for pick up. Placed at front desk.   Talbot Grumbling, RN

## 2021-12-15 NOTE — Telephone Encounter (Signed)
Reviewed, completed, and signed form.  Note routed to RN team inbasket and placed completed form in RN Wall pocket in the front office.  Martyn Malay, MD

## 2021-12-15 NOTE — Telephone Encounter (Signed)
Letter completed. Note routed to RN team inbasket and placed completed form in RN Wall pocket in the front office.  Martyn Malay, MD

## 2021-12-15 NOTE — Telephone Encounter (Signed)
Patient's son calls nurse line requesting personal care services or home health aide. Patient has Medicare and Medicaid. Printed off PCS form and placed in provider box for completion.   Talbot Grumbling, RN

## 2021-12-18 ENCOUNTER — Other Ambulatory Visit: Payer: Medicare Other

## 2021-12-19 ENCOUNTER — Telehealth: Payer: Self-pay

## 2021-12-19 NOTE — Telephone Encounter (Signed)
Form faxed.   Copy made for batch scanning.

## 2021-12-19 NOTE — Telephone Encounter (Signed)
Noted. Will plan to try to see 12/7  [name], MD  [facility] Medicine Teaching Service

## 2021-12-19 NOTE — Telephone Encounter (Signed)
Son calls nurse line requesting to reschedule apt on 12/8.  He reports his work is short handed and he will not be able to bring her.   Patient rescheduled for 12/7 with Markus Jarvis for UTI symptoms.   [name] asks PCP "stop in" if available.

## 2021-12-21 ENCOUNTER — Encounter: Payer: Self-pay | Admitting: Family Medicine

## 2021-12-21 ENCOUNTER — Ambulatory Visit (INDEPENDENT_AMBULATORY_CARE_PROVIDER_SITE_OTHER): Payer: Medicare Other | Admitting: Family Medicine

## 2021-12-21 ENCOUNTER — Other Ambulatory Visit: Payer: Self-pay

## 2021-12-21 VITALS — BP 133/55 | HR 73

## 2021-12-21 DIAGNOSIS — F3010 Manic episode without psychotic symptoms, unspecified: Secondary | ICD-10-CM | POA: Diagnosis not present

## 2021-12-21 DIAGNOSIS — F200 Paranoid schizophrenia: Secondary | ICD-10-CM

## 2021-12-21 DIAGNOSIS — N390 Urinary tract infection, site not specified: Secondary | ICD-10-CM | POA: Diagnosis not present

## 2021-12-21 DIAGNOSIS — H7192 Unspecified cholesteatoma, left ear: Secondary | ICD-10-CM | POA: Diagnosis not present

## 2021-12-21 DIAGNOSIS — R300 Dysuria: Secondary | ICD-10-CM

## 2021-12-21 LAB — POCT URINALYSIS DIP (MANUAL ENTRY)
Bilirubin, UA: NEGATIVE
Glucose, UA: 100 mg/dL — AB
Ketones, POC UA: NEGATIVE mg/dL
Leukocytes, UA: NEGATIVE
Nitrite, UA: NEGATIVE
Protein Ur, POC: >=300 mg/dL — AB
Spec Grav, UA: 1.015 (ref 1.010–1.025)
Urobilinogen, UA: 0.2 E.U./dL
pH, UA: 6.5 (ref 5.0–8.0)

## 2021-12-21 LAB — POCT UA - MICROSCOPIC ONLY
Epithelial cells, urine per micros: >20
WBC, Ur, HPF, POC: NONE SEEN (ref 0–5)

## 2021-12-21 MED ORDER — QUETIAPINE FUMARATE 25 MG PO TABS: 12.5000 mg | ORAL_TABLET | Freq: Every day | ORAL | 2 refills | Status: DC

## 2021-12-21 NOTE — Patient Instructions (Addendum)
Good to see you today - Thank you for coming in  Things we discussed today:  1) For your urinary symptoms, we will check your urine today. If there is an infection, I will reach out and send antibiotics. If not, I will update you on MyChart.   2) Take Seroquel 12.5 (half a tablet) at bedtime. This will help with sleep.   3) You can get your blood drawn at [facility].   Please always bring your medication bottles  Come back to see me in 1 month to check-up.

## 2021-12-21 NOTE — Progress Notes (Signed)
SUBJECTIVE:   CHIEF COMPLAINT / HPI:  RH is a 76yo F w/ hx of schozophrenia, CKD 3b, COPD, tobacco use  ds, HTN, OSA, CHF, PAF s/p cardioversion that presents w/ dysuria. Pt is accompanied by her son, who is her primary caretaker, he helps provide history.  Dysuria Pt reports 1.5 hx of burning and itching in her groin. Son reports she has a pungent odor in urine. She wears pull-ups, but primarily uses the bathroom in the toilet.   Schizophrenia  c/f Bipolar/Mania Pt abruptly stopped taking depakoate and olanzapine 11/24. She reports that a Neurologist had recommended her to stop (but son reports that this is not true). Saw neurologist 3-4 years ago per son, but pt says she has seen one more recently. Son feels like sundowning is getting worse. She has more decreased energy. Sometimes like "she's in a fog", acts confused. At baseline, she knows the day, knows "what is going on".   A month ago, had decreased sleep and was energized (worried this could be a manic episodes). This occurred before she stopped taking her meds.  L Ear Sensation Pt reports her L ear "feels weird" and wants it checked. Denies pain.   Interested in workup for potential UTI and checking on her psych symptoms since stopping meds.   PERTINENT  PMH / PSH: as above  OBJECTIVE:   BP (!) 133/55   Pulse 73   SpO2 100%   Gen: Pleasant, friendly older woman in wheelchair. NAD.  Psych: Alert. Mood and affect appropriate. Lip smacking. HEENT: White mass on L TM c/w cholesteatoma, no tenderness or erythema of ear canal. R TM pearly. NCAT. MMM. CV: RRR Resp:CTAB. Normal WOB on RA. Msk: No CVA tenderness  ASSESSMENT/PLAN:   Manic behavior ([place]) A month ago, pt had an episode of decreased sleep, being energized despite lack of sleep for a few days. Now back at baseline. Son reports that they have been worried about bipolar before, but never had a diagnosis. This sounds c/w a mania episode, which could point towards  schizoaffective or bipolar w/ psychosis. This episode occurred prior to her stopping depakoate and olanzapine. Will monitor for potential increased mania symptoms given that pt has stopped meds.  - Discussed mania symptoms to look out for. Son expressed understanding. - Caution with starting SSRI's and antidepressants in future  Dysuria Reports 1.5 wk hx of dysuria and itching. Has hx of dysuria w/o infection. Afebrile, no CVA tenderness. UA not c/f infxn.  - UA results discussed. Antibx not started. Pt and son are reassured that it is not a UTI.  Cholesteatoma Pt reports "weird" feeling in L ear, denies pain. Exam c/w cholesteatoma. Reassured pt and son that this is a benign condition.  Paranoid schizophrenia ([place]) Pt stopped taking depakoate and Olanzapine, reports that a neurologist told her that she should stop (Son reports this is not true). Son Banker caretaker) expressed that he is ok with her stopping her meds if that is her desire, as long as she is safe. Reports that pt has worsening decreased energy, worsening sundowning, feeling foggy/confused. She also had issues with sleeping and had a argument with son when she was unable to sleep. These symptoms are most likely multifactorial including worsening schizophrenia from medication cessation, progression of baseline schizophrenia, and underlying dementia (has vascular risk factors and advanced age).  - Shared decision making with pt and son, decided to hold depakoate and olanzapine for now. Will monitor for potential worsening psychosis, mood symptoms,  and agreeable to restarting if necessary.  - Start Seroquel 12.'5mg'$  nightly for sleep - Consider further workup for dementia(vascular vs alzheimer) in future if symptoms worsen.   Arlyce Dice, MD [place]

## 2021-12-22 ENCOUNTER — Ambulatory Visit: Payer: Medicare Other | Admitting: Family Medicine

## 2021-12-22 ENCOUNTER — Other Ambulatory Visit: Payer: Medicare Other

## 2021-12-22 DIAGNOSIS — R300 Dysuria: Secondary | ICD-10-CM | POA: Diagnosis not present

## 2021-12-22 DIAGNOSIS — F200 Paranoid schizophrenia: Secondary | ICD-10-CM

## 2021-12-23 LAB — BASIC METABOLIC PANEL
BUN/Creatinine Ratio: 21 (ref 12–28)
BUN: 33 mg/dL — ABNORMAL HIGH (ref 8–27)
CO2: 17 mmol/L — ABNORMAL LOW (ref 20–29)
Calcium: 9.5 mg/dL (ref 8.7–10.3)
Chloride: 100 mmol/L (ref 96–106)
Creatinine, Ser: 1.54 mg/dL — ABNORMAL HIGH (ref 0.57–1.00)
Glucose: 129 mg/dL — ABNORMAL HIGH (ref 70–99)
Potassium: 4.3 mmol/L (ref 3.5–5.2)
Sodium: 133 mmol/L — ABNORMAL LOW (ref 134–144)
eGFR: 34 mL/min/{1.73_m2} — ABNORMAL LOW (ref >59)

## 2021-12-24 DIAGNOSIS — F3010 Manic episode without psychotic symptoms, unspecified: Secondary | ICD-10-CM | POA: Insufficient documentation

## 2021-12-24 DIAGNOSIS — H7190 Unspecified cholesteatoma, unspecified ear: Secondary | ICD-10-CM | POA: Insufficient documentation

## 2021-12-24 NOTE — Assessment & Plan Note (Signed)
Pt reports "weird" feeling in L ear, denies pain. Exam c/w cholesteatoma. Reassured pt and son that this is a benign condition.

## 2021-12-24 NOTE — Assessment & Plan Note (Signed)
A month ago, pt had an episode of decreased sleep, being energized despite lack of sleep for a few days. Now back at baseline. Son reports that they have been worried about bipolar before, but never had a diagnosis. This sounds c/w a mania episode, which could point towards schizoaffective or bipolar w/ psychosis. This episode occurred prior to her stopping depakoate and olanzapine. Will monitor for potential increased mania symptoms given that pt has stopped meds.  - Discussed mania symptoms to look out for. Son expressed understanding. - Caution with starting SSRI's and antidepressants in future

## 2021-12-24 NOTE — Assessment & Plan Note (Signed)
Reports 1.5 wk hx of dysuria and itching. Has hx of dysuria w/o infection. Afebrile, no CVA tenderness. UA not c/f infxn.  - UA results discussed. Antibx not started. Pt and son are reassured that it is not a UTI.

## 2021-12-24 NOTE — Assessment & Plan Note (Addendum)
Pt stopped taking depakoate and Olanzapine, reports that a neurologist told her that she should stop (Son reports this is not true). Son Banker caretaker) expressed that he is ok with her stopping her meds if that is her desire, as long as she is safe. Reports that pt has worsening decreased energy, worsening sundowning, feeling foggy/confused. She also had issues with sleeping and had a argument with son when she was unable to sleep. These symptoms are most likely multifactorial including worsening schizophrenia from medication cessation, progression of baseline schizophrenia, and underlying dementia (has vascular risk factors and advanced age).  - Shared decision making with pt and son, decided to hold depakoate and olanzapine for now. Will monitor for potential worsening psychosis, mood symptoms, and agreeable to restarting if necessary.  - Start Seroquel 12.'5mg'$  nightly for sleep - Consider further workup for dementia(vascular vs alzheimer) in future if symptoms worsen.

## 2021-12-28 ENCOUNTER — Ambulatory Visit: Payer: Self-pay | Admitting: Licensed Clinical Social Worker

## 2021-12-28 NOTE — Patient Outreach (Signed)
SW removed self from [place], [place], MSW, [place]  Social Worker [facility]  [phone]

## 2021-12-29 ENCOUNTER — Other Ambulatory Visit: Payer: Self-pay

## 2021-12-29 ENCOUNTER — Emergency Department (HOSPITAL_COMMUNITY): Payer: Medicare Other

## 2021-12-29 ENCOUNTER — Encounter (HOSPITAL_COMMUNITY): Payer: Self-pay | Admitting: Emergency Medicine

## 2021-12-29 ENCOUNTER — Emergency Department (HOSPITAL_COMMUNITY)
Admission: EM | Admit: 2021-12-29 | Discharge: 2021-12-30 | Disposition: A | Discharge status: Home / Self Care | Payer: Medicare Other | Attending: Emergency Medicine | Admitting: Emergency Medicine

## 2021-12-29 DIAGNOSIS — R519 Headache, unspecified: Secondary | ICD-10-CM | POA: Insufficient documentation

## 2021-12-29 DIAGNOSIS — Z7982 Long term (current) use of aspirin: Secondary | ICD-10-CM | POA: Insufficient documentation

## 2021-12-29 DIAGNOSIS — I7143 Infrarenal abdominal aortic aneurysm, without rupture: Secondary | ICD-10-CM | POA: Insufficient documentation

## 2021-12-29 DIAGNOSIS — R109 Unspecified abdominal pain: Secondary | ICD-10-CM | POA: Diagnosis present

## 2021-12-29 DIAGNOSIS — Z7901 Long term (current) use of anticoagulants: Secondary | ICD-10-CM | POA: Insufficient documentation

## 2021-12-29 DIAGNOSIS — E871 Hypo-osmolality and hyponatremia: Secondary | ICD-10-CM | POA: Insufficient documentation

## 2021-12-29 DIAGNOSIS — R0602 Shortness of breath: Secondary | ICD-10-CM | POA: Diagnosis not present

## 2021-12-29 DIAGNOSIS — R102 Pelvic and perineal pain: Secondary | ICD-10-CM | POA: Diagnosis not present

## 2021-12-29 DIAGNOSIS — I714 Abdominal aortic aneurysm, without rupture, unspecified: Secondary | ICD-10-CM | POA: Diagnosis not present

## 2021-12-29 DIAGNOSIS — R0789 Other chest pain: Secondary | ICD-10-CM | POA: Diagnosis not present

## 2021-12-29 DIAGNOSIS — N289 Disorder of kidney and ureter, unspecified: Secondary | ICD-10-CM | POA: Diagnosis not present

## 2021-12-29 DIAGNOSIS — R079 Chest pain, unspecified: Secondary | ICD-10-CM | POA: Diagnosis not present

## 2021-12-29 DIAGNOSIS — Z79899 Other long term (current) drug therapy: Secondary | ICD-10-CM | POA: Diagnosis not present

## 2021-12-29 LAB — COMPREHENSIVE METABOLIC PANEL
ALT: 13 U/L (ref 0–44)
AST: 18 U/L (ref 15–41)
Albumin: 4 g/dL (ref 3.5–5.0)
Alkaline Phosphatase: 63 U/L (ref 38–126)
Anion gap: 8 (ref 5–15)
BUN: 38 mg/dL — ABNORMAL HIGH (ref 8–23)
CO2: 17 mmol/L — ABNORMAL LOW (ref 22–32)
Calcium: 9.3 mg/dL (ref 8.9–10.3)
Chloride: 103 mmol/L (ref 98–111)
Creatinine, Ser: 1.62 mg/dL — ABNORMAL HIGH (ref 0.44–1.00)
GFR, Estimated: 32 mL/min — ABNORMAL LOW (ref >60)
Glucose, Bld: 214 mg/dL — ABNORMAL HIGH (ref 70–99)
Potassium: 3.9 mmol/L (ref 3.5–5.1)
Sodium: 128 mmol/L — ABNORMAL LOW (ref 135–145)
Total Bilirubin: 0.4 mg/dL (ref 0.3–1.2)
Total Protein: 7.4 g/dL (ref 6.5–8.1)

## 2021-12-29 LAB — RAPID URINE DRUG SCREEN, HOSP PERFORMED
Amphetamines: NOT DETECTED
Barbiturates: NOT DETECTED
Benzodiazepines: NOT DETECTED
Cocaine: NOT DETECTED
Opiates: NOT DETECTED
Tetrahydrocannabinol: NOT DETECTED

## 2021-12-29 LAB — TROPONIN I (HIGH SENSITIVITY): Troponin I (High Sensitivity): 12 ng/L (ref <18)

## 2021-12-29 LAB — URINALYSIS, ROUTINE W REFLEX MICROSCOPIC
Bilirubin Urine: NEGATIVE
Glucose, UA: 150 mg/dL — AB
Hgb urine dipstick: NEGATIVE
Ketones, ur: NEGATIVE mg/dL
Leukocytes,Ua: NEGATIVE
Nitrite: NEGATIVE
Protein, ur: >=300 mg/dL — AB
Specific Gravity, Urine: 1.008 (ref 1.005–1.030)
pH: 6 (ref 5.0–8.0)

## 2021-12-29 LAB — CBC WITH DIFFERENTIAL/PLATELET
Abs Immature Granulocytes: 0.07 10*3/uL (ref 0.00–0.07)
Basophils Absolute: 0.1 10*3/uL (ref 0.0–0.1)
Basophils Relative: 1 %
Eosinophils Absolute: 0.2 10*3/uL (ref 0.0–0.5)
Eosinophils Relative: 2 %
HCT: 35.8 % — ABNORMAL LOW (ref 36.0–46.0)
Hemoglobin: 11.4 g/dL — ABNORMAL LOW (ref 12.0–15.0)
Immature Granulocytes: 1 %
Lymphocytes Relative: 27 %
Lymphs Abs: 3.4 10*3/uL (ref 0.7–4.0)
MCH: 27.5 pg (ref 26.0–34.0)
MCHC: 31.8 g/dL (ref 30.0–36.0)
MCV: 86.5 fL (ref 80.0–100.0)
Monocytes Absolute: 0.6 10*3/uL (ref 0.1–1.0)
Monocytes Relative: 5 %
Neutro Abs: 8.1 10*3/uL — ABNORMAL HIGH (ref 1.7–7.7)
Neutrophils Relative %: 64 %
Platelets: 255 10*3/uL (ref 150–400)
RBC: 4.14 MIL/uL (ref 3.87–5.11)
RDW: 15.5 % (ref 11.5–15.5)
WBC: 12.4 10*3/uL — ABNORMAL HIGH (ref 4.0–10.5)
nRBC: 0 % (ref 0.0–0.2)

## 2021-12-29 LAB — LIPASE, BLOOD: Lipase: 95 U/L — ABNORMAL HIGH (ref 11–51)

## 2021-12-29 LAB — BRAIN NATRIURETIC PEPTIDE: B Natriuretic Peptide: 356 pg/mL — ABNORMAL HIGH (ref 0.0–100.0)

## 2021-12-29 MED ORDER — ACETAMINOPHEN 325 MG PO TABS
650.0000 mg | ORAL_TABLET | Freq: Once | ORAL | Status: AC
  Administered 2021-12-29: 650 mg via ORAL
  Filled 2021-12-29: qty 2

## 2021-12-29 MED ORDER — FENTANYL CITRATE PF 50 MCG/ML IJ SOSY
25.0000 ug | PREFILLED_SYRINGE | Freq: Once | INTRAMUSCULAR | Status: DC
  Filled 2021-12-29: qty 1

## 2021-12-29 NOTE — ED Provider Triage Note (Signed)
Emergency Medicine Provider Triage Evaluation Note  [facility] , a 76 y.o. female  was evaluated in triage.  Pt complains of multiple complaints.  Been having chest pain, abdominal pain, UTI symptoms over the last 3 or so weeks.  Was seen by PCP and told not UTI.  Son states she has been more sleepy than normal.  She removed herself off of her medications from home over the last few weeks.  History of paranoid schizophrenia.  Patient has a difficult time describing her symptoms.  Chest pain is nonexertional, nonpleuritic in nature.  States she "sometimes" has shortness of breath however none currently.  Denies lower extremity swelling.  No changes in bowel movements.  Has dysuria as well as a "problem down there" that she cannot describe. No fever, cough, back pain, hematuria. No recent falls  Review of Systems  Positive: CP, ABD pain, UT sx Negative: Fever, cough, back pain  Physical Exam  There were no vitals taken for this visit. Gen:   Awake, no distress   Resp:  Normal effort  MSK:   Moves extremities without difficulty  Other:    Medical Decision Making  Medically screening exam initiated at 3:35 PM.  Appropriate orders placed.  [facility] was informed that the remainder of the evaluation will be completed by another provider, this initial triage assessment does not replace that evaluation, and the importance of remaining in the ED until their evaluation is complete.  CP, ABD Pain, UTI sx   Necia Kamm A, PA-C 12/29/21 1537

## 2021-12-29 NOTE — ED Triage Notes (Signed)
Pt reports abdominal pain, chest pain, urinary symptoms, and stopping all home meds. X 3 weeks.

## 2021-12-29 NOTE — ED Provider Notes (Signed)
[facility] DEPT Provider Note   CSN: [id number] Arrival date & time: 12/29/21  1515     History Chief Complaint  Patient presents with   Abdominal Pain    Abigail Wallace is a 76 y.o. female presents emerged department for evaluation of multiple complaints.  She does not mention any chest pain or shortness of breath to me however she mention this to triage staff.  She is currently only complaining of vaginal pain and headache and occasional abdominal pain.  No nausea or vomiting.  No diarrhea or constipation.  She has been less interested in her activities for the past week per son at bedside.  He has not noted any other changes.  He reports that she is not wanting to listen to her music or do the dishes.  She is also thinking that she is pregnant but this has been going on for the past few weeks and her psych team is aware of it.  She is also not been on her medications for the past 3 weeks as well as she reports her "neurologist told her not to".  She has not seen a neurologist in the past 4 to 5 years.  Abdominal Pain      Home Medications Prior to Admission medications   Medication Sig Start Date End Date Taking? Authorizing Provider  acetaminophen (TYLENOL) 500 MG tablet Take 1,000 mg by mouth every 6 (six) hours as needed for moderate pain.     [provider]  albuterol (VENTOLIN HFA) 108 (90 Base) MCG/ACT inhaler Inhale 2 puffs into the lungs every 6 (six) hours as needed for wheezing or shortness of breath. 07/12/21   Martyn Malay, MD  allopurinol (ZYLOPRIM) 100 MG tablet Take 1 tablet (100 mg total) by mouth daily. 07/12/21   Martyn Malay, MD  amLODipine (NORVASC) 10 MG tablet Take 1 tablet (10 mg total) by mouth daily. 11/13/21   Martyn Malay, MD  apixaban (ELIQUIS) 5 MG TABS tablet Take 1 tablet (5 mg total) by mouth 2 (two) times daily. 07/12/21 07/12/22  Martyn Malay, MD  Blood Glucose Monitoring Suppl [facility] VERIO)  w/Device KIT Check blood sugar once daily 07/06/19   Martyn Malay, MD  cholecalciferol (VITAMIN D3) 25 MCG (1000 UNIT) tablet Take 1 tablet (1,000 Units total) by mouth daily. 05/09/21   Martyn Malay, MD  diclofenac Sodium (VOLTAREN) 1 % GEL Apply 2 g topically 4 (four) times daily as needed (pain). 04/25/21   Martyn Malay, MD  divalproex (DEPAKOTE ER) 250 MG 24 hr tablet Take 250 mg by mouth at bedtime.  02/11/18   [provider]  DULoxetine (CYMBALTA) 20 MG capsule Take 1 capsule (20 mg total) by mouth daily. Patient not taking: Reported on 11/29/2021 11/10/21   Martyn Malay, MD  furosemide (LASIX) 20 MG tablet Take 1 tablet for 2 days Patient not taking: Reported on 11/29/2021 06/22/21   Martyn Malay, MD  glucose blood [facility] VERIO) test strip Please use to check blood sugar once daily. E11.9 09/05/21   Martyn Malay, MD  hydrocortisone cream 0.5 % Apply 1 application topically 2 (two) times daily. Apply WITH Nystatin under left breast 11/28/20   Martyn Malay, MD  Incontinence Supplies MISC 1 Units by Does not apply route as needed. 12/13/15   McKeag, Marylynn Pearson, MD  ipratropium-albuterol (DUONEB) 0.5-2.5 (3) MG/3ML SOLN Take 3 mLs by nebulization every 6 (six) hours as needed. Patient not  taking: Reported on 11/29/2021 09/14/21   Martyn Malay, MD  Lancet Device MISC 1 Device by Does not apply route 3 (three) times a week. 07/15/19   Martyn Malay, MD  linagliptin (TRADJENTA) 5 MG TABS tablet Take 1 tablet (5 mg total) by mouth daily. 02/13/21   Martyn Malay, MD  metoprolol tartrate (LOPRESSOR) 50 MG tablet Take 1 tablet (50 mg total) by mouth 2 (two) times daily. 05/09/21 05/04/22  Martyn Malay, MD  mupirocin cream (BACTROBAN) 2 % Apply 1 application. topically 2 (two) times daily. 03/24/21   Martyn Malay, MD  nitroGLYCERIN (NITROSTAT) 0.4 MG SL tablet PLACE ONE TABLET UNDER THE TONGUE EVERY FIVE MINUTES FOR THREE DOSES AS NEEDED FOR CHEST PAIN. CALL 911 AFTER  THAT Patient not taking: Reported on 04/17/2021 10/17/20   Croitoru, Mihai, MD  nystatin (MYCOSTATIN/NYSTOP) powder Apply 1 Application topically 3 (three) times daily. 07/03/21   Martyn Malay, MD  nystatin cream (MYCOSTATIN) Apply 1 application  topically 2 (two) times daily. 06/23/21   Martyn Malay, MD  OLANZAPINE PO Take by mouth.    [provider]  olmesartan (BENICAR) 5 MG tablet Take 5 mg by mouth every evening.    [provider]  OneTouch Delica Lancets 64P MISC Use to test once daily.  DX code:E11.9 07/15/20   Martyn Malay, MD  pravastatin (PRAVACHOL) 40 MG tablet TAKE ONE TABLET BY MOUTH EVERY EVENING 05/08/21   Croitoru, Dani Gobble, MD  QUEtiapine (SEROQUEL) 25 MG tablet Take 0.5 tablets (12.5 mg total) by mouth at bedtime. 12/21/21   Arlyce Dice, MD  ramelteon (ROZEREM) 8 MG tablet Take 1 tablet (8 mg total) by mouth at bedtime. 08/11/21   Lenoria Chime, MD  senna (SENOKOT) 8.6 MG TABS tablet Take 1 tablet (8.6 mg total) by mouth daily as needed for mild constipation. 04/11/21   Martyn Malay, MD  sodium bicarbonate 650 MG tablet Take 1 tablet (650 mg total) by mouth 2 (two) times daily. 07/12/21   Martyn Malay, MD  umeclidinium-vilanterol [facility] ELLIPTA) 62.5-25 MCG/ACT AEPB Inhale 1 puff into the lungs daily.    [provider]      Allergies    Abilify [aripiprazole], Clozapine, Benadryl [diphenhydramine hcl], Cogentin [benztropine], Haloperidol lactate, Latuda [lurasidone hcl], Lorazepam, Remeron [mirtazapine], Keflex [cephalexin], Codeine, and Latex    Review of Systems   Review of Systems  Unable to perform ROS: Psychiatric disorder  Gastrointestinal:  Positive for abdominal pain.    Physical Exam Updated Vital Signs BP (!) 158/64 (BP Location: Right Arm)   Pulse 70   Temp 98.6 F (37 C) (Oral)   Resp 16   SpO2 94%  Physical Exam Vitals and nursing note reviewed. Exam conducted with a chaperone present Mauricio Po, RN).  Constitutional:       Appearance: She is not toxic-appearing.     Comments: Chronically ill-appearing, pleasant female  HENT:     Mouth/Throat:     Mouth: Mucous membranes are moist.     Comments: Multiple missing teeth Cardiovascular:     Rate and Rhythm: Normal rate.  Pulmonary:     Effort: Pulmonary effort is normal. No respiratory distress.  Abdominal:     General: Bowel sounds are normal.     Palpations: Abdomen is soft.     Tenderness: There is generalized abdominal tenderness.     Hernia: A hernia is present.     Comments: Abdominal exam limited secondary to body habitus.  Patient has a very large hernia inferior to the umbilicus.  Unsure of what it looks like at baseline.  It is soft.  Diffuse tenderness palpation without any guarding or rebound.  Genitourinary:    Comments: External vaginal exam done.  Shows some white clumpy discharge near the clitoral hood.  Some thin white discharge present.  No rash seen. Skin:    General: Skin is warm and dry.  Neurological:     Mental Status: She is alert.     Comments: At baseline per son  Psychiatric:     Comments: Patient thinks she is pregnant.      ED Results / Procedures / Treatments   Labs (all labs ordered are listed, but only abnormal results are displayed) Labs Reviewed  CBC WITH DIFFERENTIAL/PLATELET - Abnormal; Notable for the following components:      Result Value   WBC 12.4 (*)    Hemoglobin 11.4 (*)    HCT 35.8 (*)    Neutro Abs 8.1 (*)    All other components within normal limits  URINALYSIS, ROUTINE W REFLEX MICROSCOPIC - Abnormal; Notable for the following components:   Color, Urine STRAW (*)    Glucose, UA 150 (*)    Protein, ur >=300 (*)    Bacteria, UA RARE (*)    All other components within normal limits  URINE CULTURE  RAPID URINE DRUG SCREEN, HOSP PERFORMED  COMPREHENSIVE METABOLIC PANEL  LIPASE, BLOOD  BRAIN NATRIURETIC PEPTIDE  TROPONIN I (HIGH SENSITIVITY)  TROPONIN I (HIGH SENSITIVITY)     EKG None  Radiology DG Chest 2 View  Result Date: 12/29/2021 CLINICAL DATA:  Chest pain. EXAM: CHEST - 2 VIEW COMPARISON:  August 14, 2021. FINDINGS: Stable cardiomegaly. Left-sided pacemaker is unchanged in position. Both lungs are clear. The visualized skeletal structures are unremarkable. IMPRESSION: No active cardiopulmonary disease. Electronically Signed   By: Marijo Conception M.D.   On: 12/29/2021 16:10    Procedures Procedures   Medications Ordered in ED Medications  fentaNYL (SUBLIMAZE) injection 25 mcg (has no administration in time range)    ED Course/ Medical Decision Making/ A&P                           Medical Decision Making Amount and/or Complexity of Data Reviewed Radiology: ordered.  Risk Prescription drug management.   76 year old female presents emerged department for evaluation of multiple complaints.  She does not mention any chest pain or shortness of breath to me however she mention this to triage staff.  She is currently only complaining of vaginal pain and headache and occasional abdominal pain.  No nausea or vomiting.  No diarrhea or constipation.  She has been less interested in her activities for the past week per son at bedside.  He has not noted any other changes.  He reports that she is not wanting to listen to her music or do the dishes.  She is also thinking that she is pregnant but this has been going on for the past few weeks and her psych team is aware of it.  She is also not been on her medications for the past 3 weeks as well as she reports her "neurologist told her not to".  She has not seen a neurologist in the past 4 to 5 years.  Given the patient's multiple plaints and poor historian, my attending did assess at bedside.  He agrees to the labs ordered and also agrees with  the CT head and CT abdomen pelvis with contrast.  I independently reviewed and interpreted the patient's labs.  CBC shows slight increase in white blood cell count of 12.4  with a left shift.  Patient's hemoglobin 11.4 appears to be around her baseline.  The patient's white blood cell count is also around her baseline appears that her normal is around 12.  Urinalysis shows straw-colored urine with 150 glucose greater than 300 protein with rare bacteria.  0-5 white blood cells, red blood cells, negative leukocytes seen.  Not consistent with any infection although we will process urine culture given the patient's chronic dysuria.  UDS negative.  BNP, troponin, lipase, CMP pending at this time.  Chest x-ray shows no acute cardiopulmonary process.  CT head and CT abdomen pelvis with contrast pending.  I discussed this case with my attending physician who cosigned this note including patient's presenting symptoms, physical exam, and planned diagnostics and interventions. Attending physician stated agreement with plan or made changes to plan which were implemented.   Attending physician assessed patient at bedside.   10:33 PM [facility]  transferred to PA Deno Etienne at the end of my shift as the patient will require reassessment once labs/imaging have resulted. Patient presentation, ED course, and plan of care discussed with review of all pertinent labs and imaging. Please see his/her note for further details regarding further ED course and disposition. Plan at time of handoff is follow up on labs and imaging. This may be altered or completely changed at the discretion of the oncoming team pending results of further workup.  Final Clinical Impression(s) / ED Diagnoses Final diagnoses:  None    Rx / DC Orders ED Discharge Orders     None         Sherrell Puller, PA-C 12/29/21 2246    Drenda Freeze, MD 12/30/21 [phone]

## 2021-12-30 ENCOUNTER — Encounter: Payer: Self-pay | Admitting: Family Medicine

## 2021-12-30 ENCOUNTER — Emergency Department (HOSPITAL_COMMUNITY): Payer: Medicare Other

## 2021-12-30 DIAGNOSIS — R109 Unspecified abdominal pain: Secondary | ICD-10-CM | POA: Diagnosis not present

## 2021-12-30 DIAGNOSIS — R519 Headache, unspecified: Secondary | ICD-10-CM | POA: Diagnosis not present

## 2021-12-30 DIAGNOSIS — N289 Disorder of kidney and ureter, unspecified: Secondary | ICD-10-CM | POA: Diagnosis not present

## 2021-12-30 DIAGNOSIS — I7143 Infrarenal abdominal aortic aneurysm, without rupture: Secondary | ICD-10-CM | POA: Diagnosis not present

## 2021-12-30 MED ORDER — SODIUM CHLORIDE 0.9 % IV BOLUS: 500.0000 mL | Freq: Once | INTRAVENOUS | Status: DC

## 2021-12-30 MED ORDER — IOHEXOL 300 MG/ML  SOLN
75.0000 mL | Freq: Once | INTRAMUSCULAR | Status: AC | PRN
  Administered 2021-12-30: 75 mL via INTRAVENOUS

## 2021-12-30 NOTE — Discharge Instructions (Addendum)
Low-sodium-likely from patient being slightly dehydrated, I recommend from patient both water as well as Pedialyte/Gatorade's to help replenish her electrolytes.  Please follow-up with her PCP for reassessment. Infrarenal aneurysm-measures 3.6, please follow with your PCP for further assessment.  Come back to the emergency department if you develop chest pain, shortness of breath, severe abdominal pain, uncontrolled nausea, vomiting, diarrhea.

## 2021-12-30 NOTE — ED Provider Notes (Signed)
She is that shifting from [facility] please see note for full detail  In short patient with medical history including GERD, hypertension, atrial fibrillation on Eliquis, COPD, type 2 diabetes, CHF, paranoia schizophrenia presents with multiple complaints, patient previously told provider that she was having headache, chest pain abdominal pain, on my examination she was having no complaints at this time, she states that she is just ready go home.  Son is at bedside and states that she is at her normal baseline, states that patient will complain of multiple things and feels that this is just from her psychiatric disorder.  States that she has been eating no difficulty with urination no foul-smelling smell, no falls.  Per previous provider follow-up on imaging and discharge accordingly. Physical Exam  BP (!) 196/96   Pulse 76   Temp 98.1 F (36.7 C) (Oral)   Resp 18   SpO2 99%   Physical Exam Vitals and nursing note reviewed.  Constitutional:      General: She is not in acute distress.    Appearance: She is not ill-appearing.  HENT:     Head: Normocephalic and atraumatic.     Comments: No deformity of the head present no raccoon eyes or Battle sign noted.    Nose: No congestion.     Mouth/Throat:     Comments: No trismus no torticollis no oral trauma. Eyes:     Extraocular Movements: Extraocular movements intact.     Conjunctiva/sclera: Conjunctivae normal.     Pupils: Pupils are equal, round, and reactive to light.  Cardiovascular:     Rate and Rhythm: Normal rate and regular rhythm.     Pulses: Normal pulses.  Pulmonary:     Effort: Pulmonary effort is normal.  Abdominal:     Palpations: Abdomen is soft.     Tenderness: There is no abdominal tenderness. There is no right CVA tenderness or left CVA tenderness.     Comments: Patient is a noted umbilical hernia, nontender during palpation, easily reducible, abdomen soft nontender nonsurgical abdomen.  Skin:    General: Skin is warm  and dry.  Neurological:     Mental Status: She is alert.     Comments: No facial asymmetry no difficulty with word finding fine two-step commands there is no unilateral weakness present.  Psychiatric:        Mood and Affect: Mood normal.     Procedures  Procedures  ED Course / MDM    Medical Decision Making Amount and/or Complexity of Data Reviewed Radiology: ordered.  Risk OTC drugs. Prescription drug management.      Lab Tests:  I Ordered, and personally interpreted labs.  The pertinent results include: CBC shows leukocytosis which is stable at 12.4, notes anemia hemoglobin 11.4 stable for patient, CMP shows slightly lower sodium of 128, CO2 of 17, glucose 214 BUN 38, creatinine 1.62, UA is unremarkable, lipase 95, negative delta troponins,   Imaging Studies ordered:  I ordered imaging studies including CT head, DG chest, CT abdomen pelvis I independently visualized and interpreted imaging which showed chest x-ray negative acute findings, CT head shows old left thalamic small vessel infarct, large CHF space arachnoid cyst unchanged I agree with the radiologist interpretation   Cardiac Monitoring:  The patient was maintained on a cardiac monitor.  I personally viewed and interpreted the cardiac monitored which showed an underlying rhythm of: Without signs of ischemia   Medicines ordered and prescription drug management:  I ordered medication including N/A she has  I have reviewed the patients home medicines and have made adjustments as needed  Critical Interventions:  N/A   Reevaluation:  Patient is reassessed resting comfortably she is having no complaints, unfortunately patient had ripped out her IV, I spoke with the son and explained that she looks slightly dry on her lab work I would recommend IV fluids but if she is tolerating p.o. he can certainly give her fluids orally, he states that he would rather do this since it was able to get IV here in the first  place.  I find this agreeable they are ready for discharge at this time.    Consultations Obtained:  N/a    Test Considered:  N/a    Rule out low suspicion for internal head bleed and or mass as CT imaging is negative for acute findings.  Low suspicion for CVA she has no focal deficit present my exam. I have low suspicion for liver or gallbladder abnormality as she has no right upper quadrant tenderness, liver enzymes, alk phos, T bili all within normal limits.  Low suspicion for pancreatitis as lipase is within normal limits.  Low suspicion for ruptured stomach ulcer as she has no peritoneal sign present on exam.  Suspicion for diverticulitis, bowel obstruction, incarcerated hernia, volvulus is low at this time CT imaging is negative.  I doubt DKA or HHS as she has no anion gap, no ketones noted in her urine, she does have slightly decreased CO2, I suspect this is likely secondary due to dehydration as she also had a slightly elevated creatinine and low sodium.  No the patient does have a low sodium but this is slightly lower than her baseline, she has known new mental changes she is at her baseline per her son do not feel patient needs to be admitted for sodium resuscitation.  I doubt UTI [place] or kidney stone as UA is negative for signs hematuria or infection.    Dispostion and problem list  After consideration of the diagnostic results and the patients response to treatment, I feel that the patent would benefit from discharge.  Infrarenal aortic aneurysm-patient's son was made aware of this, will have him follow-up with his PCP for further evaluation. Low sodium-son was made aware of this, will have him rehydrate orally and follow-up with PCP for further evaluation.           Marcello Fennel, PA-C 12/30/21 [place], [place], DO 12/30/21 [phone]

## 2021-12-31 LAB — URINE CULTURE: Culture: 10000 — AB

## 2022-01-01 ENCOUNTER — Telehealth: Payer: Self-pay | Admitting: Family Medicine

## 2022-01-01 ENCOUNTER — Ambulatory Visit (INDEPENDENT_AMBULATORY_CARE_PROVIDER_SITE_OTHER): Payer: Medicare Other

## 2022-01-01 DIAGNOSIS — I4819 Other persistent atrial fibrillation: Secondary | ICD-10-CM

## 2022-01-01 NOTE — Telephone Encounter (Signed)
Called patient. Reviewed ED course. Reviewed all incidental findings. Scheduled for lab follow up. She is doing well. Tolerating Seroquel.  [name], MD  Family Medicine Teaching Service

## 2022-01-02 ENCOUNTER — Telehealth: Payer: Self-pay

## 2022-01-02 LAB — CUP PACEART REMOTE DEVICE CHECK
Battery Impedance: 1189 Ohm
Battery Remaining Longevity: 48 mo
Battery Voltage: 2.77 V
Brady Statistic AP VP Percent: 69 %
Brady Statistic AP VS Percent: 0 %
Brady Statistic AS VP Percent: 31 %
Brady Statistic AS VS Percent: 0 %
Date Time Interrogation Session: 20231218062310
Implantable Lead Connection Status: 753985
Implantable Lead Connection Status: 753985
Implantable Lead Implant Date: 20070414
Implantable Lead Implant Date: 20070914
Implantable Lead Location: 753859
Implantable Lead Location: 753860
Implantable Lead Model: 4092
Implantable Lead Model: 5594
Implantable Pulse Generator Implant Date: 20160816
Lead Channel Impedance Value: 466 Ohm
Lead Channel Impedance Value: 644 Ohm
Lead Channel Pacing Threshold Amplitude: 0.5 V
Lead Channel Pacing Threshold Amplitude: 0.75 V
Lead Channel Pacing Threshold Pulse Width: 0.4 ms
Lead Channel Pacing Threshold Pulse Width: 0.4 ms
Lead Channel Setting Pacing Amplitude: 2 V
Lead Channel Setting Pacing Amplitude: 2.5 V
Lead Channel Setting Pacing Pulse Width: 0.4 ms
Lead Channel Setting Sensing Sensitivity: 4 mV
Zone Setting Status: 755011
Zone Setting Status: 755011

## 2022-01-02 NOTE — Telephone Encounter (Signed)
Abigail Wallace LVM on nurse line requesting to speak with PCP in regards to recent lab work.   I attempted to call Abigail Wallace back to gather more information, however no answer.   Will forward to PCP.

## 2022-01-02 NOTE — Telephone Encounter (Signed)
A lab visit on Wednesday is fine. Please call and schedule per patient preference.   Please let son know I will reach out to Psychiatry.  Thank you [name], MD  [facility] Medicine Teaching Service

## 2022-01-02 NOTE — Telephone Encounter (Signed)
Patient's son returns call to nurse line. He states that they have family coming in on Friday afternoon and wanted to know if it would be okay to reschedule lab visit for next Wednesday, 12/27.  [name] also states that he is having a hard time getting in touch with psychiatrist in order to start Cymbalta. He is asking if Dr. [name] has a way of contacting them.   Talbot Grumbling, RN

## 2022-01-02 NOTE — Telephone Encounter (Signed)
LVM for patient's son Georgena Spurling concerning LAB visit and Dr. Saul Fordyce message.  Ozella Almond, [place]

## 2022-01-04 ENCOUNTER — Telehealth: Payer: Self-pay

## 2022-01-04 NOTE — Telephone Encounter (Signed)
     Patient  visit on 12/16  at [facility]    Have you been able to follow up with your primary care physician? Yes it is scheduled  The patient was or was not able to obtain any needed medicine or equipment. Yes   Are there diet recommendations that you are having difficulty following? NA  Patient expresses understanding of discharge instructions and education provided has no other needs at this time. Yes     [place], [facility], Care Management  [phone] [address], [place], [place] [address] Phone: [phone] Email: [email or web address]

## 2022-01-05 ENCOUNTER — Other Ambulatory Visit: Payer: Self-pay

## 2022-01-10 ENCOUNTER — Other Ambulatory Visit: Payer: Medicare Other

## 2022-01-11 ENCOUNTER — Encounter: Payer: Self-pay | Admitting: Family Medicine

## 2022-01-11 ENCOUNTER — Other Ambulatory Visit: Payer: Medicare Other

## 2022-01-11 ENCOUNTER — Ambulatory Visit (INDEPENDENT_AMBULATORY_CARE_PROVIDER_SITE_OTHER): Payer: Medicare Other | Admitting: Family Medicine

## 2022-01-11 VITALS — BP 131/78 | HR 70 | Ht 66.0 in

## 2022-01-11 DIAGNOSIS — S81802A Unspecified open wound, left lower leg, initial encounter: Secondary | ICD-10-CM

## 2022-01-11 DIAGNOSIS — R300 Dysuria: Secondary | ICD-10-CM

## 2022-01-11 DIAGNOSIS — N1832 Chronic kidney disease, stage 3b: Secondary | ICD-10-CM

## 2022-01-11 LAB — POCT UA - MICROSCOPIC ONLY: WBC, Ur, HPF, POC: NONE SEEN (ref 0–5)

## 2022-01-11 LAB — POCT URINALYSIS DIP (MANUAL ENTRY)
Bilirubin, UA: NEGATIVE
Glucose, UA: 100 mg/dL — AB
Ketones, POC UA: NEGATIVE mg/dL
Leukocytes, UA: NEGATIVE
Nitrite, UA: NEGATIVE
Protein Ur, POC: >=300 mg/dL — AB
Spec Grav, UA: 1.015 (ref 1.010–1.025)
Urobilinogen, UA: 0.2 E.U./dL
pH, UA: 6 (ref 5.0–8.0)

## 2022-01-11 NOTE — Progress Notes (Addendum)
    SUBJECTIVE:   CHIEF COMPLAINT / HPI:   Sore on L Leg Has a small area that is weeping clear fluid.  No known injury or redness or streaks or pain. No fever. Not wearing compression  Follow up Hyponatremia from ER visit 12/15 Has been drinking water and gatorade regularly No vomiting  Dysuria Son feels she has been going more often and has strong smell and darker color and insists on checking a urine   OBJECTIVE:   BP 131/78   Pulse 70   Ht _0  (1.676 m)   SpO2 100%   BMI 44.87 kg/m   Alert conversant anxious.  Appears well hydrated  Sitting in [facility] Lower extremity - left has a small 2 mm open area on anterior leg below knee with clear fluid discharge. No purulence or surrounding erythema Diffuse pitting edema of both lower legs without redness or severe tenderness  ASSESSMENT/PLAN:   Dysuria -     POCT UA - Microscopic Only -     POCT urinalysis dipstick  Stage 3b chronic kidney disease (HCC) -     CMP14+EGFR -     POCT UA - Microscopic Only -     POCT urinalysis dipstick  Wound of left lower extremity, initial encounter   Hyponatremia Clinically appears hydrated Will check follow up lab If remains low son will need to cut back on amount of free water they are giving  Leg Wound - no signs of infection.  Just serous drainage Leg was elevated and bacitracin and telfa applied with coban compression wrap from toes to just below knee Patient tolerated well. Son expresses understanding of how to apply first thing in AM before she leaves her hospital bed  Son ordered compression stockings from [employer] while in room  Dysuria Ua ordered    Lind Covert, [place]

## 2022-01-11 NOTE — Patient Instructions (Signed)
Good to see you today - Thank you for coming in  Things we discussed today:  Leg - Keep elevated as much as you can - Use the compression coban when ever the leg is down - Keep the nonstick dressing on the sore - Use vaseline after 2 days - Use compression stockings before leave the bed in the AM

## 2022-01-12 ENCOUNTER — Telehealth: Payer: Self-pay

## 2022-01-12 DIAGNOSIS — I872 Venous insufficiency (chronic) (peripheral): Secondary | ICD-10-CM

## 2022-01-12 DIAGNOSIS — N1832 Chronic kidney disease, stage 3b: Secondary | ICD-10-CM

## 2022-01-12 DIAGNOSIS — E878 Other disorders of electrolyte and fluid balance, not elsewhere classified: Secondary | ICD-10-CM

## 2022-01-12 LAB — CMP14+EGFR
ALT: 9 IU/L (ref 0–32)
AST: 12 IU/L (ref 0–40)
Albumin/Globulin Ratio: 2 (ref 1.2–2.2)
Albumin: 4.3 g/dL (ref 3.8–4.8)
Alkaline Phosphatase: 78 IU/L (ref 44–121)
BUN/Creatinine Ratio: 18 (ref 12–28)
BUN: 30 mg/dL — ABNORMAL HIGH (ref 8–27)
Bilirubin Total: 0.2 mg/dL (ref 0.0–1.2)
CO2: 13 mmol/L — ABNORMAL LOW (ref 20–29)
Calcium: 9.6 mg/dL (ref 8.7–10.3)
Chloride: 102 mmol/L (ref 96–106)
Creatinine, Ser: 1.63 mg/dL — ABNORMAL HIGH (ref 0.57–1.00)
Globulin, Total: 2.1 g/dL (ref 1.5–4.5)
Glucose: 112 mg/dL — ABNORMAL HIGH (ref 70–99)
Potassium: 4.4 mmol/L (ref 3.5–5.2)
Sodium: 133 mmol/L — ABNORMAL LOW (ref 134–144)
Total Protein: 6.4 g/dL (ref 6.0–8.5)
eGFR: 32 mL/min/{1.73_m2} — ABNORMAL LOW (ref >59)

## 2022-01-12 NOTE — Telephone Encounter (Signed)
Son LVM on nurse line requesting a DME order for compression stockings.   He reports he went to [facility] today and they recommended a prescription stating the "firmness" of compression.   Once placed I will fax to [facility].

## 2022-01-12 NOTE — Assessment & Plan Note (Addendum)
this does not seem to be acutely symptomatic.  No signs of DKA, dehydration, No SGLT2 or exogenous substances. Most likely due to renal failure although GFR seems unchanged.  Discussed with son  If worsening mental status or breathing should go to ER for evaluation and measurement of pH  Increase bicarb to 2 tabs twice a day - watch for worsening edema or shortness of breath   Make an appointment with her nephrologist for her lab abnormalities  Repeat Bmet next week - if stable just come in for lab appointment if not improved then make an appointment

## 2022-01-12 NOTE — Telephone Encounter (Signed)
[name] calls nurse line requesting a call from provider in regards to lab results.   [name] has some concerns with patients recent CO2 levels. [name] reports she has been confused and he is wondering if this is the reason. He reports CO2 levels have never been this low before. He would like to know the cause of this and ways to improve.    He appreciates [employer] from [place], however would like for someone to call him and discuss in detail patients CO2 levels.   Will forward to PCP and provider who saw patient.

## 2022-01-12 NOTE — Telephone Encounter (Signed)
Spoke with son She is about the same as yesterday as far as alertness or sleepiness but he feels is worse compared to a few months ago. No vomiting or diarrhea or shortness of breath or labored breathing Leg is still leaking fluid - no pus or redness.    No asa or OTC medications other than tylenol and VitD Has not used CPAP for years,  Is taking Na bicarb 1 tab twice a day  Possible Acidosis  Low CO2 level - this does not seem to be acutely symptomatic.  No signs of DKA, dehydration, No SGLT2 or exogenous substances. Most likely due to renal failure although GFR seems unchanged.  Decided If worsening mental status or breathing should go to ER for evaluation and measurement of pH  Increase bicarb to 2 tabs twice a day - watch for worsening edema or shortness of breath   Continue leg elevation and compression and watch for infection signs   Make an appointment with her nephrologist for her lab abnormalities  Repeat Bmet next week - if stable just come in for lab appointment if not improved then make an appointment

## 2022-01-12 NOTE — Telephone Encounter (Signed)
DME order for compression hose, 20-30 mm Hg.   [name], MD  Family Medicine Teaching Service

## 2022-01-17 ENCOUNTER — Other Ambulatory Visit: Payer: Self-pay | Admitting: Family Medicine

## 2022-01-17 ENCOUNTER — Telehealth: Payer: Self-pay

## 2022-01-17 DIAGNOSIS — F200 Paranoid schizophrenia: Secondary | ICD-10-CM

## 2022-01-17 DIAGNOSIS — I83009 Varicose veins of unspecified lower extremity with ulcer of unspecified site: Secondary | ICD-10-CM

## 2022-01-17 DIAGNOSIS — I10 Essential (primary) hypertension: Secondary | ICD-10-CM

## 2022-01-17 MED ORDER — QUETIAPINE FUMARATE 25 MG PO TABS: 25.0000 mg | ORAL_TABLET | Freq: Two times a day (BID) | ORAL | 2 refills | Status: DC

## 2022-01-17 MED ORDER — ALLOPURINOL 100 MG PO TABS: 100.0000 mg | ORAL_TABLET | Freq: Every day | ORAL | 3 refills | Status: DC

## 2022-01-17 NOTE — Telephone Encounter (Signed)
Returned son's call.  Wounds: doing okay, wearing compression socks. No signs of infection. Okay with wound care referral  Memory/'Sundowning': doing okay with this. Would like to increase Seroquel. Psychiatry has recommend 25 mg BID. Refill to pharmacy. Acidosis: Repeat BMP and check legs on Friday, scheduled.  Reviewed return precautions.  [name], MD  Family Medicine Teaching Service

## 2022-01-17 NOTE — Telephone Encounter (Signed)
Patient calls nurse line requesting to speak with Dr. [name] regarding multiple concerns. He has concerns regarding low CO2 levels from recent labs.  Legs are still having slight drainage. He is wrapping legs with bactrim, gauze and coban. Wearing compression stockings, elevating legs.   She is no longer on olanzapine and Depakote. Psychiatrist recommended that she take Seroquel 25 mg BID. She will now be out of medication on Thursday. [name] is asking Dr. Saul Fordyce input on this change.   Please return call to [place] at [phone].  Abigail Grumbling, RN

## 2022-01-17 NOTE — Telephone Encounter (Signed)
Abigail Wallace reports they were able to purchase compression stockings over the weekend.   He reports regardless with prescription stockings were not going to be covered by insurance.   [name] Patent attorney of PCP help.

## 2022-01-19 ENCOUNTER — Encounter: Payer: Self-pay | Admitting: Family Medicine

## 2022-01-19 ENCOUNTER — Other Ambulatory Visit: Payer: Medicare Other

## 2022-01-19 DIAGNOSIS — I10 Essential (primary) hypertension: Secondary | ICD-10-CM | POA: Diagnosis not present

## 2022-01-19 NOTE — Progress Notes (Signed)
Patient seen during lab visit for venous stasis. LE look excellent in compression stockings. Will discontinue gauze and wrap underneath, can resume compression stockings.  [name], MD  Family Medicine Teaching Service

## 2022-01-20 LAB — BASIC METABOLIC PANEL
BUN/Creatinine Ratio: 16 (ref 12–28)
BUN: 26 mg/dL (ref 8–27)
CO2: 22 mmol/L (ref 20–29)
Calcium: 9.5 mg/dL (ref 8.7–10.3)
Chloride: 100 mmol/L (ref 96–106)
Creatinine, Ser: 1.6 mg/dL — ABNORMAL HIGH (ref 0.57–1.00)
Glucose: 111 mg/dL — ABNORMAL HIGH (ref 70–99)
Potassium: 4.5 mmol/L (ref 3.5–5.2)
Sodium: 135 mmol/L (ref 134–144)
eGFR: 33 mL/min/{1.73_m2} — ABNORMAL LOW (ref >59)

## 2022-01-22 ENCOUNTER — Telehealth: Payer: Self-pay | Admitting: Family Medicine

## 2022-01-22 NOTE — Telephone Encounter (Signed)
Called with results. All questions answered.  [name], MD  Family Medicine Teaching Service

## 2022-02-01 ENCOUNTER — Encounter: Payer: Self-pay | Admitting: Family Medicine

## 2022-02-01 DIAGNOSIS — R32 Unspecified urinary incontinence: Secondary | ICD-10-CM

## 2022-02-01 DIAGNOSIS — N1832 Chronic kidney disease, stage 3b: Secondary | ICD-10-CM | POA: Diagnosis not present

## 2022-02-08 NOTE — Progress Notes (Signed)
Remote pacemaker transmission.   

## 2022-02-13 DIAGNOSIS — R451 Restlessness and agitation: Secondary | ICD-10-CM | POA: Diagnosis not present

## 2022-02-13 DIAGNOSIS — G3184 Mild cognitive impairment, so stated: Secondary | ICD-10-CM | POA: Diagnosis not present

## 2022-02-14 ENCOUNTER — Ambulatory Visit: Payer: 59 | Admitting: Family Medicine

## 2022-02-14 DIAGNOSIS — N343 Urethral syndrome, unspecified: Secondary | ICD-10-CM | POA: Diagnosis not present

## 2022-02-14 DIAGNOSIS — D631 Anemia in chronic kidney disease: Secondary | ICD-10-CM | POA: Diagnosis not present

## 2022-02-14 DIAGNOSIS — I129 Hypertensive chronic kidney disease with stage 1 through stage 4 chronic kidney disease, or unspecified chronic kidney disease: Secondary | ICD-10-CM | POA: Diagnosis not present

## 2022-02-14 DIAGNOSIS — N1832 Chronic kidney disease, stage 3b: Secondary | ICD-10-CM | POA: Diagnosis not present

## 2022-02-15 MED ORDER — SODIUM BICARBONATE 650 MG PO TABS: 1300.0000 mg | ORAL_TABLET | Freq: Two times a day (BID) | ORAL | 1 refills | Status: DC

## 2022-02-15 MED ORDER — DIAPERS & SUPPLIES MISC: 1.0000 | Freq: Every day | 99 refills | Status: AC

## 2022-02-15 NOTE — Telephone Encounter (Signed)
Dr. McDiarmid- order needs to be placed as DME.   I have pended to this encounter. Please sign this and route back to me.   Thanks.   Talbot Grumbling, RN

## 2022-02-15 NOTE — Telephone Encounter (Signed)
Order for Diapers created (No Print) Order for Bicarb sent to pharmacy

## 2022-02-16 ENCOUNTER — Telehealth: Payer: Self-pay | Admitting: Family Medicine

## 2022-02-16 NOTE — Telephone Encounter (Signed)
Son dropped off form at front desk for [employer].  Verified that patient section of form has been completed.  Last DOS/WCC with PCP was 01/11/22.  Placed form in red team folder to be completed by clinical staff.  Creig Hines

## 2022-02-16 NOTE — Telephone Encounter (Signed)
I signed the DME order you set up.  Thank you.

## 2022-02-16 NOTE — Telephone Encounter (Signed)
Reviewed form and placed in PCP's box for completion.  .Ksean Vale R Yissel Habermehl, CMA  

## 2022-02-19 ENCOUNTER — Encounter: Payer: Self-pay | Admitting: Family Medicine

## 2022-02-19 DIAGNOSIS — B91 Sequelae of poliomyelitis: Secondary | ICD-10-CM | POA: Insufficient documentation

## 2022-02-19 NOTE — Telephone Encounter (Signed)
Form completed and placed in RN front office box for disposition.

## 2022-02-19 NOTE — Telephone Encounter (Signed)
Patient's son called and informed that forms are ready for pick up. Copy made and placed in batch scanning. Original placed at front desk for pick up.   Talbot Grumbling, RN

## 2022-02-20 ENCOUNTER — Ambulatory Visit: Payer: 59 | Admitting: Family Medicine

## 2022-02-20 ENCOUNTER — Other Ambulatory Visit: Payer: Self-pay

## 2022-02-20 DIAGNOSIS — E1165 Type 2 diabetes mellitus with hyperglycemia: Secondary | ICD-10-CM

## 2022-02-20 MED ORDER — LINAGLIPTIN 5 MG PO TABS: 5.0000 mg | ORAL_TABLET | Freq: Every day | ORAL | 0 refills | Status: DC

## 2022-02-22 ENCOUNTER — Ambulatory Visit (INDEPENDENT_AMBULATORY_CARE_PROVIDER_SITE_OTHER): Payer: 59 | Admitting: Family Medicine

## 2022-02-22 ENCOUNTER — Ambulatory Visit: Payer: 59 | Admitting: Family Medicine

## 2022-02-22 ENCOUNTER — Other Ambulatory Visit: Payer: Self-pay

## 2022-02-22 ENCOUNTER — Encounter: Payer: Self-pay | Admitting: Family Medicine

## 2022-02-22 ENCOUNTER — Telehealth: Payer: Self-pay

## 2022-02-22 VITALS — BP 132/73 | HR 69

## 2022-02-22 DIAGNOSIS — I10 Essential (primary) hypertension: Secondary | ICD-10-CM | POA: Diagnosis not present

## 2022-02-22 DIAGNOSIS — D649 Anemia, unspecified: Secondary | ICD-10-CM | POA: Diagnosis not present

## 2022-02-22 DIAGNOSIS — R600 Localized edema: Secondary | ICD-10-CM

## 2022-02-22 DIAGNOSIS — G3184 Mild cognitive impairment, so stated: Secondary | ICD-10-CM

## 2022-02-22 DIAGNOSIS — R1310 Dysphagia, unspecified: Secondary | ICD-10-CM

## 2022-02-22 DIAGNOSIS — F200 Paranoid schizophrenia: Secondary | ICD-10-CM

## 2022-02-22 DIAGNOSIS — N1832 Chronic kidney disease, stage 3b: Secondary | ICD-10-CM | POA: Diagnosis not present

## 2022-02-22 NOTE — Telephone Encounter (Signed)
Received the following mychart message from scheduling pool.   If possible can Abigail Wallace come in tomorrow (2/8) for her appointment with Dr Laymond Purser? The swelling in her extremities has gotten worse and she is having difficulty breathing and swallowing. She has been struggling with her mental health issues and I have been trying to get her to make her doctors appointment,I'm concerned about the swelling and her difficulties with swallowing as she has struggled with eating. My apologies for the constant changes with her appointments. Any questions please call me at [phone] Thank Abigail Wallace    I called the patient and son to discuss symptoms further. Patient states that they are already scheduled for this afternoon. Explained that I was following up on concerns for breathing and swallowing. She reports that she has had these swallowing issues "for a while now." She was able to speak in complete sentences at time of conversation.   Went over ED precautions. Son and patient both state that patient is not having emergency right now and that they can wait until this afternoon. Patient states, "I am fine now, I just really want someone to look at these spots on my arm."   Patient will be seen this afternoon at 1:50 with Dr. Nita Sells.   Talbot Grumbling, RN

## 2022-02-22 NOTE — Progress Notes (Signed)
SUBJECTIVE:   CHIEF COMPLAINT / HPI:   Abigail Wallace is a 77 y.o. female who presents to the [facility] clinic today accompanied by her son who is her caretaker to discuss the following concerns:   Bilateral Leg Swelling Overall better today but chronic problem. Was started on Lasix by her Nephrologist within the last 2 weeks to take daily. She recently completed a 10 day of cipro due to concern of urinary infection, finished last dose yesterday.  Patient is wheelchair dependent.  Son is her primary caretaker.  He reports difficulty in having patient elevate her legs she does not often listen.  He does typically have her wear compression socks. Last echo 04/2015 EF 55 to 60%  Difficulty Swallowing Chronic problem but seems to have gotten worse in the last few days. Son is unsure if this is related to cognitive decline. She is having difficulty taking her medications.   Cognitive Decline Son reports that they have been in contact with her Psychiatrist for this. She has tactile hallucinations and is rubbing her right arm "raw" per son as she feels there are insects crawling on her. She is "not sleeping at all". She takes about 3-4 naps that are 10-15 minutes in length.   Normocytic Anemia Last Hgb 11.4 in December. Son reports that when he bathes her and cleans her he intermittently notes blood in the toilet. He is not sure if the blood is coming from urine or stool. She is not UTD on colonoscopy but she also does not want to have one.   PERTINENT  PMH / PSH: Sick sinus syndrome, combined systolic and diastolic heart failure, PAF status post cardioversion, hypertension, COPD, type 2 diabetes, paranoid schizophrenia, CKD stage IIIb  OBJECTIVE:   BP 132/73   Pulse 69   SpO2 98%  Non-ambulatory, wheelchair bound, weight not obtained.   General: Awake, alert, oriented to person, time, situation but not place, pleasant, able to participate in exam Cardiac: RRR Respiratory: CTAB, normal effort,  No wheezes, rales or rhonchi Extremities: B/l legs with 2+ pitting edema to knees. Stasis dermatitis to distal feet  Skin: warm and dry, left upper extremity with xerosis, flaking skin.  Psych:  Paranoid, intermittent delusions, not agitated  ASSESSMENT/PLAN:   1. Essential hypertension Normotensive. Adherent to medications. Son does a great job managing her medications. Will not adjust medications today. - Basic Metabolic Panel  2. CKD stage IIIb Followed by Nephrology. Had recent labs with abnormalities per the son, unable to view those. Will recheck electrolytes given she is on Lasix.  - Basic Metabolic Panel  3. Bilateral edema of lower extremity Sedentary at baseline. Differential includes DVT (feel less likely given no other risk factor, b/l in nature and sedentary at baseline), venous stasis (feel this is most likely given dependent edema), HF exacerbation (feel less likely given lungs clear, no orthopnea, dyspnea- do not have weights to compare unfortunately. Last echo with adequate EF), anemia (has hx of normocytic anemia, not UTD on colorectal screenings, possible blood loss in toilet), thyroid abnormalities.  - Brain natriuretic peptide - CBC - TSH Rfx on Abnormal to Free T4 - Encouraged leg elevation when possible - Encouraged continued use of compression socks - Continue 20 mg Lasix, will check labs and adjust as necessary   4. Paranoid schizophrenia ([place]) Managed by Psychiatry. Started on Olanzapine. Not sleeping well, this is likely not helping her situation. Seems to be having some paranoia while in room, reported there was a bomb  in the room. Feels that her arm is poisoned. She also continue to state that she needed medication for her infections.  Would repeat that her son he is a doctor and that he is ill.  5. Mild cognitive impairment Son is her caretaker but she has no guardian (this is a tricky situation as she will not sign papers, appears to have capacity). Lack of  sleep is likely contributing to her worsened cognitive state.   6. Normocytic anemia Seems to be stable. Son reports some evidence of intermittent blood loss but unclear if from colon or bladder. She is current Media planner and expressed that she would not want a colonoscopy or scope of any kind. Will check labs to ensure not declining significantly and contributing to edema. Denies SOB. She is wheel-chair dependent.  - CBC  7. Dysphagia, unspecified type Edentulous. Protecting airway well. Speech is still comprehendible. No SOB or pain. She refuses EGD or scope. Has been a chronic and ongoing problem. No signs or symptoms to suggest aspiration PNA. Will monitor for now. Could consider swallow study if persists.   Sharion Settler, [place]

## 2022-02-22 NOTE — Patient Instructions (Signed)
It was wonderful to see you today.  Please bring ALL of your medications with you to every visit.   Today we talked about:  We are doing lab work today to check her hemoglobin, electrolytes, kidney function, thyroid. I will send you a MyChart message if you have MyChart. Otherwise, I will give you a call for abnormal results or send a letter if everything returned back normal. If you don't hear from me in 2 weeks, please call the office.    Depending on the lab work, we may need to adjust your Lasix.  Try to elevate legs as best as possible. I would encourage continued compression socks throughout the day.   Thank you for coming to your visit as scheduled. We have had a large "no-show" problem lately, and this significantly limits our ability to see and care for patients. As a friendly reminder- if you cannot make your appointment please call to cancel. We do have a no show policy for those who do not cancel within 24 hours. Our policy is that if you miss or fail to cancel an appointment within 24 hours, 3 times in a 65-monthperiod, you may be dismissed from our clinic.   Thank you for choosing [facility]   Please call [phone] any questions about today's appointment.  Please be sure to schedule follow up at the front  desk before you leave today.   ASharion Settler DO PGY-3 Family Medicine

## 2022-02-23 ENCOUNTER — Telehealth: Payer: Self-pay | Admitting: *Deleted

## 2022-02-23 ENCOUNTER — Telehealth: Payer: Self-pay

## 2022-02-23 ENCOUNTER — Other Ambulatory Visit: Payer: Self-pay | Admitting: Family Medicine

## 2022-02-23 ENCOUNTER — Telehealth: Payer: Self-pay | Admitting: Family Medicine

## 2022-02-23 DIAGNOSIS — I5042 Chronic combined systolic (congestive) and diastolic (congestive) heart failure: Secondary | ICD-10-CM

## 2022-02-23 LAB — CBC
Hematocrit: 33.1 % — ABNORMAL LOW (ref 34.0–46.6)
Hemoglobin: 10.6 g/dL — ABNORMAL LOW (ref 11.1–15.9)
MCH: 26.8 pg (ref 26.6–33.0)
MCHC: 32 g/dL (ref 31.5–35.7)
MCV: 84 fL (ref 79–97)
Platelets: 294 10*3/uL (ref 150–450)
RBC: 3.95 x10E6/uL (ref 3.77–5.28)
RDW: 15.4 % (ref 11.7–15.4)
WBC: 12.1 10*3/uL — ABNORMAL HIGH (ref 3.4–10.8)

## 2022-02-23 LAB — BASIC METABOLIC PANEL
BUN/Creatinine Ratio: 17 (ref 12–28)
BUN: 26 mg/dL (ref 8–27)
CO2: 19 mmol/L — ABNORMAL LOW (ref 20–29)
Calcium: 9 mg/dL (ref 8.7–10.3)
Chloride: 100 mmol/L (ref 96–106)
Creatinine, Ser: 1.52 mg/dL — ABNORMAL HIGH (ref 0.57–1.00)
Glucose: 112 mg/dL — ABNORMAL HIGH (ref 70–99)
Potassium: 3.9 mmol/L (ref 3.5–5.2)
Sodium: 136 mmol/L (ref 134–144)
eGFR: 35 mL/min/{1.73_m2} — ABNORMAL LOW (ref >59)

## 2022-02-23 LAB — BRAIN NATRIURETIC PEPTIDE: BNP: 489.1 pg/mL — ABNORMAL HIGH (ref 0.0–100.0)

## 2022-02-23 LAB — TSH RFX ON ABNORMAL TO FREE T4: TSH: 3.31 u[IU]/mL (ref 0.450–4.500)

## 2022-02-23 NOTE — Telephone Encounter (Signed)
-----   Message from Sharion Settler, DO sent at 02/23/2022  4:10 PM EST ----- Regarding: Please Schedule Echo Hi Team,  Could you please schedule this patients Echo and call her son with time/date for appointment. I discussed with him over the phone. He is aware of the plan and will be expecting phone call.  Thank you! Dawson Bills

## 2022-02-23 NOTE — Telephone Encounter (Signed)
Called son to discuss.

## 2022-02-23 NOTE — Telephone Encounter (Signed)
LMOVM informing son of echo appt for patient. Fread Kottke Kennon Holter, CMA

## 2022-02-23 NOTE — Telephone Encounter (Signed)
Patients son calls nurse line requesting recent lab results.   Will forward to ordering provider.

## 2022-02-23 NOTE — Telephone Encounter (Signed)
Called patient's son [name] to discuss his mother's recent lab results.  Discussed BNP is elevated, could suggest heart failure exacerbation.  He does endorse that his mother has intermittent orthopnea symptoms.  Currently without any shortness of breath.  Recommend that she double her Lasix dose for the next 3 days and we will order another echo to further assess.  She is scheduled to see me next week in the clinic for follow-up, we will plan to obtain labs at that time.  Provided him with strict ED precautions over the weekend should she develop worsening shortness of breath or should he have any other concerns regarding deterioration of her health. He was aware. All questions answered.

## 2022-02-25 ENCOUNTER — Telehealth: Payer: Self-pay | Admitting: Student

## 2022-02-25 ENCOUNTER — Other Ambulatory Visit: Payer: Self-pay

## 2022-02-25 ENCOUNTER — Inpatient Hospital Stay (HOSPITAL_COMMUNITY)
Admission: EM | Admit: 2022-02-25 | Discharge: 2022-03-05 | DRG: 291 | Disposition: A | Discharge status: Home Health | Payer: 59 | Attending: Family Medicine | Admitting: Family Medicine

## 2022-02-25 ENCOUNTER — Emergency Department (HOSPITAL_COMMUNITY): Payer: 59

## 2022-02-25 ENCOUNTER — Encounter (HOSPITAL_COMMUNITY): Payer: Self-pay

## 2022-02-25 DIAGNOSIS — F05 Delirium due to known physiological condition: Secondary | ICD-10-CM | POA: Diagnosis present

## 2022-02-25 DIAGNOSIS — J810 Acute pulmonary edema: Secondary | ICD-10-CM | POA: Diagnosis not present

## 2022-02-25 DIAGNOSIS — Z885 Allergy status to narcotic agent status: Secondary | ICD-10-CM

## 2022-02-25 DIAGNOSIS — Z833 Family history of diabetes mellitus: Secondary | ICD-10-CM

## 2022-02-25 DIAGNOSIS — I110 Hypertensive heart disease with heart failure: Secondary | ICD-10-CM | POA: Diagnosis not present

## 2022-02-25 DIAGNOSIS — I10 Essential (primary) hypertension: Secondary | ICD-10-CM

## 2022-02-25 DIAGNOSIS — D631 Anemia in chronic kidney disease: Secondary | ICD-10-CM | POA: Diagnosis present

## 2022-02-25 DIAGNOSIS — F03A18 Unspecified dementia, mild, with other behavioral disturbance: Secondary | ICD-10-CM | POA: Diagnosis not present

## 2022-02-25 DIAGNOSIS — Z91040 Latex allergy status: Secondary | ICD-10-CM

## 2022-02-25 DIAGNOSIS — M109 Gout, unspecified: Secondary | ICD-10-CM | POA: Diagnosis present

## 2022-02-25 DIAGNOSIS — E876 Hypokalemia: Secondary | ICD-10-CM | POA: Diagnosis not present

## 2022-02-25 DIAGNOSIS — R410 Disorientation, unspecified: Secondary | ICD-10-CM | POA: Diagnosis not present

## 2022-02-25 DIAGNOSIS — Z7189 Other specified counseling: Secondary | ICD-10-CM | POA: Diagnosis not present

## 2022-02-25 DIAGNOSIS — F201 Disorganized schizophrenia: Secondary | ICD-10-CM | POA: Diagnosis present

## 2022-02-25 DIAGNOSIS — Z8249 Family history of ischemic heart disease and other diseases of the circulatory system: Secondary | ICD-10-CM

## 2022-02-25 DIAGNOSIS — Z7901 Long term (current) use of anticoagulants: Secondary | ICD-10-CM

## 2022-02-25 DIAGNOSIS — Z743 Need for continuous supervision: Secondary | ICD-10-CM | POA: Diagnosis not present

## 2022-02-25 DIAGNOSIS — N8110 Cystocele, unspecified: Secondary | ICD-10-CM | POA: Diagnosis present

## 2022-02-25 DIAGNOSIS — I495 Sick sinus syndrome: Secondary | ICD-10-CM | POA: Diagnosis not present

## 2022-02-25 DIAGNOSIS — E785 Hyperlipidemia, unspecified: Secondary | ICD-10-CM | POA: Diagnosis present

## 2022-02-25 DIAGNOSIS — I7143 Infrarenal abdominal aortic aneurysm, without rupture: Secondary | ICD-10-CM | POA: Diagnosis present

## 2022-02-25 DIAGNOSIS — J449 Chronic obstructive pulmonary disease, unspecified: Secondary | ICD-10-CM

## 2022-02-25 DIAGNOSIS — Z1152 Encounter for screening for COVID-19: Secondary | ICD-10-CM

## 2022-02-25 DIAGNOSIS — F209 Schizophrenia, unspecified: Secondary | ICD-10-CM | POA: Diagnosis not present

## 2022-02-25 DIAGNOSIS — R4781 Slurred speech: Secondary | ICD-10-CM | POA: Diagnosis present

## 2022-02-25 DIAGNOSIS — I5031 Acute diastolic (congestive) heart failure: Secondary | ICD-10-CM | POA: Diagnosis not present

## 2022-02-25 DIAGNOSIS — I2510 Atherosclerotic heart disease of native coronary artery without angina pectoris: Secondary | ICD-10-CM | POA: Diagnosis present

## 2022-02-25 DIAGNOSIS — R339 Retention of urine, unspecified: Secondary | ICD-10-CM | POA: Diagnosis present

## 2022-02-25 DIAGNOSIS — Z950 Presence of cardiac pacemaker: Secondary | ICD-10-CM

## 2022-02-25 DIAGNOSIS — D329 Benign neoplasm of meninges, unspecified: Secondary | ICD-10-CM | POA: Diagnosis present

## 2022-02-25 DIAGNOSIS — R609 Edema, unspecified: Secondary | ICD-10-CM | POA: Diagnosis not present

## 2022-02-25 DIAGNOSIS — Z8679 Personal history of other diseases of the circulatory system: Secondary | ICD-10-CM

## 2022-02-25 DIAGNOSIS — R6889 Other general symptoms and signs: Secondary | ICD-10-CM | POA: Diagnosis not present

## 2022-02-25 DIAGNOSIS — Z515 Encounter for palliative care: Secondary | ICD-10-CM | POA: Diagnosis not present

## 2022-02-25 DIAGNOSIS — J90 Pleural effusion, not elsewhere classified: Secondary | ICD-10-CM | POA: Diagnosis not present

## 2022-02-25 DIAGNOSIS — Z6841 Body Mass Index (BMI) 40.0 and over, adult: Secondary | ICD-10-CM | POA: Diagnosis not present

## 2022-02-25 DIAGNOSIS — I5043 Acute on chronic combined systolic (congestive) and diastolic (congestive) heart failure: Secondary | ICD-10-CM | POA: Diagnosis present

## 2022-02-25 DIAGNOSIS — G928 Other toxic encephalopathy: Secondary | ICD-10-CM | POA: Diagnosis not present

## 2022-02-25 DIAGNOSIS — I509 Heart failure, unspecified: Secondary | ICD-10-CM

## 2022-02-25 DIAGNOSIS — I480 Paroxysmal atrial fibrillation: Secondary | ICD-10-CM | POA: Diagnosis not present

## 2022-02-25 DIAGNOSIS — F4310 Post-traumatic stress disorder, unspecified: Secondary | ICD-10-CM | POA: Diagnosis present

## 2022-02-25 DIAGNOSIS — I6381 Other cerebral infarction due to occlusion or stenosis of small artery: Secondary | ICD-10-CM | POA: Diagnosis not present

## 2022-02-25 DIAGNOSIS — Z993 Dependence on wheelchair: Secondary | ICD-10-CM

## 2022-02-25 DIAGNOSIS — Z91148 Patient's other noncompliance with medication regimen for other reason: Secondary | ICD-10-CM

## 2022-02-25 DIAGNOSIS — N390 Urinary tract infection, site not specified: Secondary | ICD-10-CM | POA: Diagnosis not present

## 2022-02-25 DIAGNOSIS — L89152 Pressure ulcer of sacral region, stage 2: Secondary | ICD-10-CM | POA: Diagnosis present

## 2022-02-25 DIAGNOSIS — R0602 Shortness of breath: Secondary | ICD-10-CM | POA: Diagnosis not present

## 2022-02-25 DIAGNOSIS — J811 Chronic pulmonary edema: Secondary | ICD-10-CM | POA: Diagnosis not present

## 2022-02-25 DIAGNOSIS — F17210 Nicotine dependence, cigarettes, uncomplicated: Secondary | ICD-10-CM | POA: Diagnosis not present

## 2022-02-25 DIAGNOSIS — Z823 Family history of stroke: Secondary | ICD-10-CM

## 2022-02-25 DIAGNOSIS — N179 Acute kidney failure, unspecified: Secondary | ICD-10-CM | POA: Diagnosis not present

## 2022-02-25 DIAGNOSIS — Z79899 Other long term (current) drug therapy: Secondary | ICD-10-CM

## 2022-02-25 DIAGNOSIS — I130 Hypertensive heart and chronic kidney disease with heart failure and stage 1 through stage 4 chronic kidney disease, or unspecified chronic kidney disease: Secondary | ICD-10-CM | POA: Diagnosis not present

## 2022-02-25 DIAGNOSIS — R1310 Dysphagia, unspecified: Secondary | ICD-10-CM | POA: Diagnosis present

## 2022-02-25 DIAGNOSIS — R062 Wheezing: Secondary | ICD-10-CM | POA: Diagnosis not present

## 2022-02-25 DIAGNOSIS — E1122 Type 2 diabetes mellitus with diabetic chronic kidney disease: Secondary | ICD-10-CM | POA: Diagnosis present

## 2022-02-25 DIAGNOSIS — K219 Gastro-esophageal reflux disease without esophagitis: Secondary | ICD-10-CM | POA: Diagnosis present

## 2022-02-25 DIAGNOSIS — Z7401 Bed confinement status: Secondary | ICD-10-CM | POA: Diagnosis not present

## 2022-02-25 DIAGNOSIS — R404 Transient alteration of awareness: Secondary | ICD-10-CM | POA: Diagnosis not present

## 2022-02-25 DIAGNOSIS — N184 Chronic kidney disease, stage 4 (severe): Secondary | ICD-10-CM | POA: Diagnosis not present

## 2022-02-25 DIAGNOSIS — N1832 Chronic kidney disease, stage 3b: Secondary | ICD-10-CM

## 2022-02-25 DIAGNOSIS — G4733 Obstructive sleep apnea (adult) (pediatric): Secondary | ICD-10-CM | POA: Diagnosis present

## 2022-02-25 DIAGNOSIS — Z888 Allergy status to other drugs, medicaments and biological substances status: Secondary | ICD-10-CM

## 2022-02-25 LAB — COMPREHENSIVE METABOLIC PANEL
ALT: 15 U/L (ref 0–44)
AST: 22 U/L (ref 15–41)
Albumin: 3.1 g/dL — ABNORMAL LOW (ref 3.5–5.0)
Alkaline Phosphatase: 46 U/L (ref 38–126)
Anion gap: 11 (ref 5–15)
BUN: 28 mg/dL — ABNORMAL HIGH (ref 8–23)
CO2: 21 mmol/L — ABNORMAL LOW (ref 22–32)
Calcium: 9.1 mg/dL (ref 8.9–10.3)
Chloride: 103 mmol/L (ref 98–111)
Creatinine, Ser: 1.47 mg/dL — ABNORMAL HIGH (ref 0.44–1.00)
GFR, Estimated: 36 mL/min — ABNORMAL LOW (ref >60)
Glucose, Bld: 198 mg/dL — ABNORMAL HIGH (ref 70–99)
Potassium: 3.6 mmol/L (ref 3.5–5.1)
Sodium: 135 mmol/L (ref 135–145)
Total Bilirubin: 0.5 mg/dL (ref 0.3–1.2)
Total Protein: 6.1 g/dL — ABNORMAL LOW (ref 6.5–8.1)

## 2022-02-25 LAB — CBC WITH DIFFERENTIAL/PLATELET
Abs Immature Granulocytes: 0.11 10*3/uL — ABNORMAL HIGH (ref 0.00–0.07)
Basophils Absolute: 0.1 10*3/uL (ref 0.0–0.1)
Basophils Relative: 1 %
Eosinophils Absolute: 0.2 10*3/uL (ref 0.0–0.5)
Eosinophils Relative: 2 %
HCT: 31.9 % — ABNORMAL LOW (ref 36.0–46.0)
Hemoglobin: 9.5 g/dL — ABNORMAL LOW (ref 12.0–15.0)
Immature Granulocytes: 1 %
Lymphocytes Relative: 17 %
Lymphs Abs: 2.1 10*3/uL (ref 0.7–4.0)
MCH: 26.5 pg (ref 26.0–34.0)
MCHC: 29.8 g/dL — ABNORMAL LOW (ref 30.0–36.0)
MCV: 89.1 fL (ref 80.0–100.0)
Monocytes Absolute: 0.9 10*3/uL (ref 0.1–1.0)
Monocytes Relative: 7 %
Neutro Abs: 8.8 10*3/uL — ABNORMAL HIGH (ref 1.7–7.7)
Neutrophils Relative %: 72 %
Platelets: 281 10*3/uL (ref 150–400)
RBC: 3.58 MIL/uL — ABNORMAL LOW (ref 3.87–5.11)
RDW: 15.8 % — ABNORMAL HIGH (ref 11.5–15.5)
WBC: 12.2 10*3/uL — ABNORMAL HIGH (ref 4.0–10.5)
nRBC: 0 % (ref 0.0–0.2)

## 2022-02-25 LAB — TROPONIN I (HIGH SENSITIVITY): Troponin I (High Sensitivity): 18 ng/L — ABNORMAL HIGH (ref <18)

## 2022-02-25 LAB — RESP PANEL BY RT-PCR (RSV, FLU A&B, COVID)  RVPGX2
Influenza A by PCR: NEGATIVE
Influenza B by PCR: NEGATIVE
Resp Syncytial Virus by PCR: NEGATIVE
SARS Coronavirus 2 by RT PCR: NEGATIVE

## 2022-02-25 LAB — GLUCOSE, CAPILLARY: Glucose-Capillary: 120 mg/dL — ABNORMAL HIGH (ref 70–99)

## 2022-02-25 LAB — BRAIN NATRIURETIC PEPTIDE: B Natriuretic Peptide: 690.8 pg/mL — ABNORMAL HIGH (ref 0.0–100.0)

## 2022-02-25 MED ORDER — ONDANSETRON HCL 4 MG/2ML IJ SOLN
4.0000 mg | Freq: Four times a day (QID) | INTRAMUSCULAR | Status: DC | PRN
  Administered 2022-02-27 – 2022-02-28 (×2): 4 mg via INTRAVENOUS
  Filled 2022-02-25 (×2): qty 2

## 2022-02-25 MED ORDER — UMECLIDINIUM-VILANTEROL 62.5-25 MCG/ACT IN AEPB
1.0000 | INHALATION_SPRAY | Freq: Every day | RESPIRATORY_TRACT | Status: DC
  Administered 2022-02-26 – 2022-03-05 (×8): 1 via RESPIRATORY_TRACT
  Filled 2022-02-25 (×2): qty 14

## 2022-02-25 MED ORDER — FUROSEMIDE 10 MG/ML IJ SOLN
80.0000 mg | Freq: Once | INTRAMUSCULAR | Status: AC
  Administered 2022-02-25: 80 mg via INTRAVENOUS
  Filled 2022-02-25: qty 8

## 2022-02-25 MED ORDER — HYDRALAZINE HCL 25 MG PO TABS
25.0000 mg | ORAL_TABLET | Freq: Three times a day (TID) | ORAL | Status: DC | PRN
  Administered 2022-02-26 – 2022-03-05 (×3): 25 mg via ORAL
  Filled 2022-02-25 (×4): qty 1

## 2022-02-25 MED ORDER — OLANZAPINE 5 MG PO TABS
7.5000 mg | ORAL_TABLET | Freq: Every day | ORAL | Status: DC
  Filled 2022-02-25: qty 2

## 2022-02-25 MED ORDER — PRAVASTATIN SODIUM 40 MG PO TABS
40.0000 mg | ORAL_TABLET | Freq: Every evening | ORAL | Status: DC
  Administered 2022-02-25 – 2022-03-04 (×8): 40 mg via ORAL
  Filled 2022-02-25 (×8): qty 1

## 2022-02-25 MED ORDER — DIVALPROEX SODIUM ER 250 MG PO TB24
250.0000 mg | ORAL_TABLET | Freq: Every day | ORAL | Status: DC
  Filled 2022-02-25 (×2): qty 1

## 2022-02-25 MED ORDER — AMLODIPINE BESYLATE 10 MG PO TABS
10.0000 mg | ORAL_TABLET | Freq: Every day | ORAL | Status: DC
  Administered 2022-02-25 – 2022-03-05 (×9): 10 mg via ORAL
  Filled 2022-02-25 (×9): qty 1

## 2022-02-25 MED ORDER — APIXABAN 5 MG PO TABS
5.0000 mg | ORAL_TABLET | Freq: Two times a day (BID) | ORAL | Status: DC
  Administered 2022-02-25 – 2022-03-05 (×16): 5 mg via ORAL
  Filled 2022-02-25 (×16): qty 1

## 2022-02-25 MED ORDER — ONDANSETRON HCL 4 MG PO TABS
4.0000 mg | ORAL_TABLET | Freq: Four times a day (QID) | ORAL | Status: DC | PRN
  Administered 2022-02-28: 4 mg via ORAL
  Filled 2022-02-25: qty 1

## 2022-02-25 MED ORDER — INSULIN ASPART 100 UNIT/ML IJ SOLN
0.0000 [IU] | Freq: Three times a day (TID) | INTRAMUSCULAR | Status: DC
  Administered 2022-02-26 – 2022-02-28 (×4): 1 [IU] via SUBCUTANEOUS
  Administered 2022-02-28: 2 [IU] via SUBCUTANEOUS
  Administered 2022-03-01: 3 [IU] via SUBCUTANEOUS
  Administered 2022-03-01 (×2): 1 [IU] via SUBCUTANEOUS
  Administered 2022-03-02 – 2022-03-03 (×5): 2 [IU] via SUBCUTANEOUS
  Administered 2022-03-03 – 2022-03-05 (×5): 1 [IU] via SUBCUTANEOUS
  Administered 2022-03-05: 2 [IU] via SUBCUTANEOUS

## 2022-02-25 MED ORDER — ACETAMINOPHEN 500 MG PO TABS
1000.0000 mg | ORAL_TABLET | Freq: Four times a day (QID) | ORAL | Status: DC | PRN
  Administered 2022-02-25 – 2022-03-05 (×16): 1000 mg via ORAL
  Filled 2022-02-25 (×18): qty 2

## 2022-02-25 MED ORDER — FUROSEMIDE 10 MG/ML IJ SOLN
40.0000 mg | Freq: Two times a day (BID) | INTRAMUSCULAR | Status: DC
  Administered 2022-02-26 – 2022-02-28 (×5): 40 mg via INTRAVENOUS
  Filled 2022-02-25 (×5): qty 4

## 2022-02-25 MED ORDER — SENNA 8.6 MG PO TABS
1.0000 | ORAL_TABLET | Freq: Every day | ORAL | Status: DC | PRN
  Administered 2022-02-26 – 2022-03-04 (×3): 8.6 mg via ORAL
  Filled 2022-02-25 (×3): qty 1

## 2022-02-25 MED ORDER — ALLOPURINOL 100 MG PO TABS
100.0000 mg | ORAL_TABLET | Freq: Every day | ORAL | Status: DC
  Administered 2022-02-25 – 2022-03-05 (×9): 100 mg via ORAL
  Filled 2022-02-25 (×9): qty 1

## 2022-02-25 MED ORDER — IPRATROPIUM-ALBUTEROL 0.5-2.5 (3) MG/3ML IN SOLN
3.0000 mL | Freq: Four times a day (QID) | RESPIRATORY_TRACT | Status: DC | PRN
  Administered 2022-02-26 – 2022-03-04 (×7): 3 mL via RESPIRATORY_TRACT
  Filled 2022-02-25 (×7): qty 3

## 2022-02-25 MED ORDER — SODIUM BICARBONATE 650 MG PO TABS
1300.0000 mg | ORAL_TABLET | Freq: Two times a day (BID) | ORAL | Status: DC
  Administered 2022-02-25 – 2022-03-03 (×12): 1300 mg via ORAL
  Filled 2022-02-25 (×12): qty 2

## 2022-02-25 NOTE — ED Notes (Signed)
Pt alert, NAD, calm, interactive, resps e/u, speaking clearly, confused, talking with son at [facility], tolerated IV by [place] well. Blood sent to lab.

## 2022-02-25 NOTE — Telephone Encounter (Signed)
**  After Hours/ Emergency Line Call**  Received a call to report that [facility].  Endorsing weakness over last week-unable to stand up anymore, slurred speech (this is the 3rd day she has had this-worse in the mornings and gets worse throughout day), choking a lot more than normal/strangled, low grade fevers. Son is wondering if she needs this echocardiogram earlier. Recommended that son call EMS (given she is non-ambulatory and son is unable to get her to car). Discussed she could be having heart failure exacerbation, likely needs labs or imaging to r/o infection and physical exam. Given slurred speech stroke or TIA possible however this has been ongoing for 3 days. Weakness throughout ongoing since clinic visit last.  Gerrit Heck  PGY-2, [facility] 02/25/2022 12:03 PM

## 2022-02-25 NOTE — ED Provider Notes (Signed)
[place] EMERGENCY DEPARTMENT AT [facility] Provider Note   CSN: [id number] Arrival date & time: 02/25/22  1340     History  Chief Complaint  Patient presents with   Shortness of Breath    Abigail Wallace is a 77 y.o. female.  Patient is a 77 year old female who presents with shortness of breath.  She has a history of schizophrenia and some recent cognitive decline, sick sinus syndrome status post pacemaker placement, COPD, CHF, diabetes, hypertension, paroxysmal atrial fibrillation on Eliquis.  Her son is at bedside who also helps provide story.  He states she has had some worsening shortness of breath over the last 4 days.  He saw his PCP at family medicine clinic and thought that patient was a little fluid overloaded.  She is on Lasix but has a hard time keeping her legs elevated.  He does note that she has had some increased leg swelling recently but today it looks little bit better than baseline.  She had a temperature of 99 yesterday but no other fevers.  She denies any chest pain.  She has had a congested cough.  He has noted some cognitive decline recently over the last month or so.  She does have some worsening hallucinations at times.         Home Medications Prior to Admission medications   Medication Sig Start Date End Date Taking? Authorizing Provider  acetaminophen (TYLENOL) 500 MG tablet Take 1,000 mg by mouth every 6 (six) hours as needed for moderate pain.     [provider]  albuterol (VENTOLIN HFA) 108 (90 Base) MCG/ACT inhaler Inhale 2 puffs into the lungs every 6 (six) hours as needed for wheezing or shortness of breath. 07/12/21   Martyn Malay, MD  allopurinol (ZYLOPRIM) 100 MG tablet Take 1 tablet (100 mg total) by mouth daily. 01/17/22   Martyn Malay, MD  amLODipine (NORVASC) 10 MG tablet Take 1 tablet (10 mg total) by mouth daily. 11/13/21   Martyn Malay, MD  apixaban (ELIQUIS) 5 MG TABS tablet Take 1 tablet (5 mg total) by mouth 2  (two) times daily. 07/12/21 07/12/22  Martyn Malay, MD  Blood Glucose Monitoring Suppl [facility] VERIO) w/Device KIT Check blood sugar once daily 07/06/19   Martyn Malay, MD  cholecalciferol (VITAMIN D3) 25 MCG (1000 UNIT) tablet Take 1 tablet (1,000 Units total) by mouth daily. 05/09/21   Martyn Malay, MD  Diapers & Supplies MISC 1 Product by Does not apply route 6 (six) times daily. 02/15/22 03/17/22  McDiarmid, Blane Ohara, MD  diclofenac Sodium (VOLTAREN) 1 % GEL Apply 2 g topically 4 (four) times daily as needed (pain). 04/25/21   Martyn Malay, MD  divalproex (DEPAKOTE ER) 250 MG 24 hr tablet Take 250 mg by mouth at bedtime.  02/11/18   [provider]  furosemide (LASIX) 20 MG tablet Take 20 mg by mouth daily.    [provider]  glucose blood (ONETOUCH VERIO) test strip Please use to check blood sugar once daily. E11.9 09/05/21   Martyn Malay, MD  hydrocortisone cream 0.5 % Apply 1 application topically 2 (two) times daily. Apply WITH Nystatin under left breast 11/28/20   Martyn Malay, MD  Incontinence Supplies MISC 1 Units by Does not apply route as needed. 12/13/15   McKeag, Marylynn Pearson, MD  ipratropium-albuterol (DUONEB) 0.5-2.5 (3) MG/3ML SOLN Take 3 mLs by nebulization every 6 (six) hours as needed. Patient not taking:  Reported on 11/29/2021 09/14/21   Martyn Malay, MD  Lancet Device MISC 1 Device by Does not apply route 3 (three) times a week. 07/15/19   Martyn Malay, MD  linagliptin (TRADJENTA) 5 MG TABS tablet Take 1 tablet (5 mg total) by mouth daily. 02/20/22   Dickie La, MD  metoprolol tartrate (LOPRESSOR) 50 MG tablet Take 1 tablet (50 mg total) by mouth 2 (two) times daily. 05/09/21 05/04/22  Martyn Malay, MD  mupirocin cream (BACTROBAN) 2 % Apply 1 application. topically 2 (two) times daily. 03/24/21   Martyn Malay, MD  nitroGLYCERIN (NITROSTAT) 0.4 MG SL tablet PLACE ONE TABLET UNDER THE TONGUE EVERY FIVE MINUTES FOR THREE DOSES AS NEEDED FOR CHEST PAIN.  CALL 911 AFTER THAT 10/17/20   Croitoru, Mihai, MD  nystatin (MYCOSTATIN/NYSTOP) powder Apply 1 Application topically 3 (three) times daily. 07/03/21   Martyn Malay, MD  nystatin cream (MYCOSTATIN) Apply 1 application  topically 2 (two) times daily. 06/23/21   Martyn Malay, MD  OLANZapine (ZYPREXA) 7.5 MG tablet Take 7.5 mg by mouth at bedtime.    [provider]  olmesartan (BENICAR) 5 MG tablet Take 5 mg by mouth every evening.    [provider]  OneTouch Delica Lancets 99991111 MISC Use to test once daily.  DX code:E11.9 07/15/20   Martyn Malay, MD  pravastatin (PRAVACHOL) 40 MG tablet TAKE ONE TABLET BY MOUTH EVERY EVENING 05/08/21   Croitoru, Dani Gobble, MD  senna (SENOKOT) 8.6 MG TABS tablet Take 1 tablet (8.6 mg total) by mouth daily as needed for mild constipation. 04/11/21   Martyn Malay, MD  sodium bicarbonate 650 MG tablet Take 2 tablets (1,300 mg total) by mouth 2 (two) times daily. 02/15/22   McDiarmid, Blane Ohara, MD  umeclidinium-vilanterol (ANORO ELLIPTA) 62.5-25 MCG/ACT AEPB Inhale 1 puff into the lungs daily. Patient not taking: Reported on 02/22/2022    [provider]      Allergies    Abilify [aripiprazole], Clozapine, Benadryl [diphenhydramine hcl], Cogentin [benztropine], Haloperidol lactate, Latuda [lurasidone hcl], Lorazepam, Remeron [mirtazapine], Keflex [cephalexin], Codeine, and Latex    Review of Systems   Review of Systems  Constitutional:  Negative for chills, diaphoresis, fatigue and fever.  HENT:  Negative for congestion, rhinorrhea and sneezing.   Eyes: Negative.   Respiratory:  Positive for cough and shortness of breath. Negative for chest tightness.   Cardiovascular:  Positive for leg swelling. Negative for chest pain.  Gastrointestinal:  Negative for abdominal pain, blood in stool, diarrhea, nausea and vomiting.  Genitourinary:  Negative for difficulty urinating, flank pain, frequency and hematuria.  Musculoskeletal:  Negative for  arthralgias and back pain.  Skin:  Negative for rash.  Neurological:  Negative for dizziness, speech difficulty, weakness, numbness and headaches.  Psychiatric/Behavioral:  Positive for agitation and hallucinations.     Physical Exam Updated Vital Signs BP (!) 152/114 (BP Location: Right Arm)   Pulse 69   Temp 97.9 F (36.6 C) (Oral)   Resp 19   Ht 5' 6"$  (1.676 m)   Wt 127 kg   SpO2 97%   BMI 45.19 kg/m  Physical Exam Constitutional:      Appearance: She is well-developed.  HENT:     Head: Normocephalic and atraumatic.  Eyes:     Pupils: Pupils are equal, round, and reactive to light.  Cardiovascular:     Rate and Rhythm: Normal rate and regular rhythm.     Heart sounds: Normal heart  sounds.  Pulmonary:     Effort: Pulmonary effort is normal. No respiratory distress.     Breath sounds: Rales present. No wheezing.  Chest:     Chest wall: No tenderness.  Abdominal:     General: Bowel sounds are normal.     Palpations: Abdomen is soft.     Tenderness: There is no abdominal tenderness. There is no guarding or rebound.  Musculoskeletal:        General: Normal range of motion.     Cervical back: Normal range of motion and neck supple.     Right lower leg: Edema present.     Left lower leg: Edema present.     Comments: 2+ pitting edema to lower extremities bilaterally  Lymphadenopathy:     Cervical: No cervical adenopathy.  Skin:    General: Skin is warm and dry.     Findings: No rash.  Neurological:     General: No focal deficit present.     Mental Status: She is alert and oriented to person, place, and time.     ED Results / Procedures / Treatments   Labs (all labs ordered are listed, but only abnormal results are displayed) Labs Reviewed  BRAIN NATRIURETIC PEPTIDE - Abnormal; Notable for the following components:      Result Value   B Natriuretic Peptide 690.8 (*)    All other components within normal limits  COMPREHENSIVE METABOLIC PANEL - Abnormal; Notable  for the following components:   CO2 21 (*)    Glucose, Bld 198 (*)    BUN 28 (*)    Creatinine, Ser 1.47 (*)    Total Protein 6.1 (*)    Albumin 3.1 (*)    GFR, Estimated 36 (*)    All other components within normal limits  CBC WITH DIFFERENTIAL/PLATELET - Abnormal; Notable for the following components:   WBC 12.2 (*)    RBC 3.58 (*)    Hemoglobin 9.5 (*)    HCT 31.9 (*)    MCHC 29.8 (*)    RDW 15.8 (*)    Neutro Abs 8.8 (*)    Abs Immature Granulocytes 0.11 (*)    All other components within normal limits  TROPONIN I (HIGH SENSITIVITY) - Abnormal; Notable for the following components:   Troponin I (High Sensitivity) 18 (*)    All other components within normal limits  RESP PANEL BY RT-PCR (RSV, FLU A&B, COVID)  [id number]  TROPONIN I (HIGH SENSITIVITY)    EKG EKG Interpretation  Date/Time:  Sunday February 25 2022 13:51:50 EST Ventricular Rate:  70 PR Interval:  168 QRS Duration: 191 QT Interval:  493 QTC Calculation: 533 R Axis:   -46 Text Interpretation: VENTRICULAR PACED RHYTHM Confirmed by Malvin Johns [phone]) on 02/25/2022 3:21:39 PM  Radiology DG Chest Port 1 View  Result Date: 02/25/2022 CLINICAL DATA:  Shortness of breath EXAM: PORTABLE CHEST 1 VIEW COMPARISON:  12/29/2021 FINDINGS: Left-sided implanted cardiac device. Cardiomegaly. Aortic atherosclerosis. Pulmonary vascular congestion. Mild interstitial prominence. No pleural effusion or pneumothorax. IMPRESSION: Findings suggestive of CHF with mild edema. Electronically Signed   By: Davina Poke D.O.   On: 02/25/2022 15:06    Procedures Procedures    Medications Ordered in ED Medications  furosemide (LASIX) injection 80 mg (80 mg Intravenous Given 02/25/22 1529)    ED Course/ Medical Decision Making/ A&P  Medical Decision Making Amount and/or Complexity of Data Reviewed Labs: ordered. Radiology: ordered.  Risk Prescription drug management. Decision regarding  hospitalization.   Patient is a 77 year old female who presents for shortness of breath and increased leg swelling.  She does not have any suggestions of pneumonia.  Chest x-ray which was interpreted by me and confirmed by the radiologist shows evidence of pulmonary edema.  Her BNP is elevated.  She was given some IV Lasix.  She is not requiring oxygen but she is tachypneic.  She is not febrile.  Her troponin is only elevated.  She is not reporting chest pain.  I suspect it is demand ischemia but will trend to make sure it is not increasing.  Her other labs are nonconcerning.  Her son said that he has noticed some slurred speech over the last few days, please for 3 days.  She is also had some difficulty swallowing for which she has seen her PCP recently.  I do not find any other focal deficits.  She may be had some questionable facial drooping but she has normal motor function and sensation in her extremities.  I had ordered a head CT but she is currently refusing.  She is oriented x 3 and does not feel like she had a stroke and so does not want a CT scan.  She is out of the window for any treatment for acute stroke if this was in fact a stroke.  Perhaps while she is in the hospital, she can be convinced to have imaging.  I do feel like she needs to be admitted for some ongoing diuresis and possibly an echo as I do not see that she has had 1 recently.  I discussed with with Dr. Karleen Hampshire who will admit the patient for further treatment.  Final Clinical Impression(s) / ED Diagnoses Final diagnoses:  Acute on chronic congestive heart failure, unspecified heart failure type ([place])  Slurred speech    Rx / DC Orders ED Discharge Orders     None         Malvin Johns, MD 02/25/22 1820

## 2022-02-25 NOTE — H&P (Addendum)
History and Physical    Patient: Abigail Wallace [id number] DOB: [birth year] DOA: 02/25/2022 DOS: the patient was seen and examined on 02/25/2022 PCP: Martyn Malay, MD  Patient coming from: Home  Chief Complaint:  Chief Complaint  Patient presents with   Shortness of Breath   HPI: Stoney Vanneste is a 77 y.o. female with medical history significant of CHF, hypertension. Stage 4 CKD, COPD, GERD, atrial fibrillation on eliquis, psychosis,  SSS, s/p PPM, type 2 DM, gout.was brought in by her son for increased confusion, sob and slurred speech since one week. Patient is a poor historian. Most of the history is obtained from the son and EDP.  She reports sob, denies any chest pain, worsening pedal edema,. She denies any fevers or chills, she denies any chest pain, dizziness or syncope. Son reports orthopnea, . She denies urinary symptoms. She denies any nausea, vomiting diarrhea and abdominal pain.  As per the son, she is having hallucinations intermittently, refusing breathing treatments at home.  ED work up.  Lab work done showing creatinine of 1.47, bicarb of 21, BNP of 690, troponin of 18, wbc of 12,200, hemoglobin of 9.5, platelets of 281, . Influenza pcr is negative. COVID 19 negative. CXR showed mild CHF.  EKG is paced.  She was given 80 mg of IV lasix without much urine output or symptomatic relief.  She was still sob and tachypneic.  CT head without contrast was ordered for evaluation of stroke but patient is refused the test.    She was referred to [facility] for admission for acute CHF.    Review of Systems: Unable to review all systems due to lack of cooperation from patient. Past Medical History:  Diagnosis Date   Abdominal wall hernia 01/2017   Advanced care planning/counseling discussion 09/29/2021   Atrial fibrillation (HCC)    CHF (congestive heart failure) ([place])    CKD (chronic kidney disease)    COPD (chronic obstructive pulmonary disease) ([place])    Coronary artery  disease    NON-CRITICAL   Ectasis aorta (HCC)    GERD (gastroesophageal reflux disease)    Gout    Hypertension    Memory difficulties 12/02/2013   Pacemaker    Psychosis ([place])    Tardive dyskinesia 12/02/2013   Type 2 diabetes mellitus ([place])    Past Surgical History:  Procedure Laterality Date   CARDIAC CATHETERIZATION  2007   Non-critical.    CARDIOVERSION N/A 01/27/2021   Procedure: CARDIOVERSION;  Surgeon: Geralynn Rile, MD;  Location: [employer];  Service: Cardiovascular;  Laterality: N/A;   CHOLECYSTECTOMY     ELBOW SURGERY     EP IMPLANTABLE DEVICE N/A 08/31/2014   Procedure: PPM Generator Changeout;  Surgeon: Sanda Klein, MD;  Location: [facility];  Service: Cardiovascular;  Laterality: N/A;   INSERT / REPLACE / REMOVE PACEMAKER  2007   [place]/SYMPTOMATIC HEART BLOCK   PACEMAKER PLACEMENT  09/28/2005   2/2 SSS?   Persantine stress test  05/01/2010   EF 66%. Normal LV sys fx. Unchanged from previous studies.    TRANSTHORACIC ECHOCARDIOGRAM  09/08/10   SEVERE CONCENTRIC HYPERTROPHY.LV FUNCTION WAS VIGOROUS.EF 65%-70%.VENTRICULAR SEPTUM-INCOORDINATE MOTION.LEFT ATRIUM-MILDLY DILATED.TRIVIAL TR.   TUBAL LIGATION     Social History:  reports that she has been smoking cigarettes. She has never used smokeless tobacco. She reports that she does not drink alcohol and does not use drugs.  Allergies  Allergen Reactions   Abilify [Aripiprazole] Anaphylaxis, Shortness Of Breath and Other (  See Comments)    Tremors   Clozapine Anaphylaxis   Benadryl [Diphenhydramine Hcl] Other (See Comments)    States affects her vision   Cogentin [Benztropine] Other (See Comments)    Affects mobility   Haloperidol Lactate Other (See Comments)    Vision changes, Shaking, Drooling   Latuda [Lurasidone Hcl] Other (See Comments)    "Tremors and shakes."    Lorazepam     Other reaction(s): Unknown   Remeron [Mirtazapine] Other (See Comments)    Tremors and shakes   Keflex  [Cephalexin]     Per patient, has tongue swelling with keflex but can tolerate amoxicillin   Codeine Other (See Comments)    Unknown    Latex Rash    Family History  Problem Relation Age of Onset   Heart disease Mother    Heart disease Father    Stroke Sister    Diabetes type II Son     Prior to Admission medications   Medication Sig Start Date End Date Taking? Authorizing Provider  acetaminophen (TYLENOL) 500 MG tablet Take 1,000 mg by mouth every 6 (six) hours as needed for moderate pain.     [provider]  albuterol (VENTOLIN HFA) 108 (90 Base) MCG/ACT inhaler Inhale 2 puffs into the lungs every 6 (six) hours as needed for wheezing or shortness of breath. 07/12/21   Martyn Malay, MD  allopurinol (ZYLOPRIM) 100 MG tablet Take 1 tablet (100 mg total) by mouth daily. 01/17/22   Martyn Malay, MD  amLODipine (NORVASC) 10 MG tablet Take 1 tablet (10 mg total) by mouth daily. 11/13/21   Martyn Malay, MD  apixaban (ELIQUIS) 5 MG TABS tablet Take 1 tablet (5 mg total) by mouth 2 (two) times daily. 07/12/21 07/12/22  Martyn Malay, MD  Blood Glucose Monitoring Suppl [facility] VERIO) w/Device KIT Check blood sugar once daily 07/06/19   Martyn Malay, MD  cholecalciferol (VITAMIN D3) 25 MCG (1000 UNIT) tablet Take 1 tablet (1,000 Units total) by mouth daily. 05/09/21   Martyn Malay, MD  Diapers & Supplies MISC 1 Product by Does not apply route 6 (six) times daily. 02/15/22 03/17/22  McDiarmid, Blane Ohara, MD  diclofenac Sodium (VOLTAREN) 1 % GEL Apply 2 g topically 4 (four) times daily as needed (pain). 04/25/21   Martyn Malay, MD  divalproex (DEPAKOTE ER) 250 MG 24 hr tablet Take 250 mg by mouth at bedtime.  02/11/18   [provider]  furosemide (LASIX) 20 MG tablet Take 20 mg by mouth daily.    [provider]  glucose blood (ONETOUCH VERIO) test strip Please use to check blood sugar once daily. E11.9 09/05/21   Martyn Malay, MD  hydrocortisone cream 0.5 %  Apply 1 application topically 2 (two) times daily. Apply WITH Nystatin under left breast 11/28/20   Martyn Malay, MD  Incontinence Supplies MISC 1 Units by Does not apply route as needed. 12/13/15   McKeag, Marylynn Pearson, MD  ipratropium-albuterol (DUONEB) 0.5-2.5 (3) MG/3ML SOLN Take 3 mLs by nebulization every 6 (six) hours as needed. Patient not taking: Reported on 11/29/2021 09/14/21   Martyn Malay, MD  Lancet Device MISC 1 Device by Does not apply route 3 (three) times a week. 07/15/19   Martyn Malay, MD  linagliptin (TRADJENTA) 5 MG TABS tablet Take 1 tablet (5 mg total) by mouth daily. 02/20/22   Dickie La, MD  metoprolol tartrate (LOPRESSOR) 50 MG tablet Take 1 tablet (  50 mg total) by mouth 2 (two) times daily. 05/09/21 05/04/22  Martyn Malay, MD  mupirocin cream (BACTROBAN) 2 % Apply 1 application. topically 2 (two) times daily. 03/24/21   Martyn Malay, MD  nitroGLYCERIN (NITROSTAT) 0.4 MG SL tablet PLACE ONE TABLET UNDER THE TONGUE EVERY FIVE MINUTES FOR THREE DOSES AS NEEDED FOR CHEST PAIN. CALL 911 AFTER THAT 10/17/20   Croitoru, Mihai, MD  nystatin (MYCOSTATIN/NYSTOP) powder Apply 1 Application topically 3 (three) times daily. 07/03/21   Martyn Malay, MD  nystatin cream (MYCOSTATIN) Apply 1 application  topically 2 (two) times daily. 06/23/21   Martyn Malay, MD  OLANZapine (ZYPREXA) 7.5 MG tablet Take 7.5 mg by mouth at bedtime.    [provider]  olmesartan (BENICAR) 5 MG tablet Take 5 mg by mouth every evening.    [provider]  OneTouch Delica Lancets 99991111 MISC Use to test once daily.  DX code:E11.9 07/15/20   Martyn Malay, MD  pravastatin (PRAVACHOL) 40 MG tablet TAKE ONE TABLET BY MOUTH EVERY EVENING 05/08/21   Croitoru, Dani Gobble, MD  senna (SENOKOT) 8.6 MG TABS tablet Take 1 tablet (8.6 mg total) by mouth daily as needed for mild constipation. 04/11/21   Martyn Malay, MD  sodium bicarbonate 650 MG tablet Take 2 tablets (1,300 mg total) by mouth 2 (two) times  daily. 02/15/22   McDiarmid, Blane Ohara, MD  umeclidinium-vilanterol (ANORO ELLIPTA) 62.5-25 MCG/ACT AEPB Inhale 1 puff into the lungs daily. Patient not taking: Reported on 02/22/2022    [provider]    Physical Exam: Vitals:   02/25/22 1350 02/25/22 1354 02/25/22 1357 02/25/22 1522  BP:  (!) 90/56  (!) 152/114  Pulse:  70  69  Resp:  (!) 25  19  Temp:  97.9 F (36.6 C)    TempSrc:  Oral    SpO2: 97% 97%  97%  Weight:   127 kg   Height:   5' 6"$  (1.676 m)    General exam:Elderly lady, not in distress.  Respiratory system: diminished air entry at bases, on RA.  Cardiovascular system: S1 & S2 heard, regular, JVD cannot be appreciated. Pedal edema present.  Gastrointestinal system: Abdomen is nondistended, soft and nontender.  Central nervous system: Alert and oriented oriented to person and place. Able to move all extremities. No facial droop.  Extremities: 2+ leg edema.  Skin: No rashes, Psychiatry: confused.   Data Reviewed: Results for orders placed or performed during the hospital encounter of 02/25/22 (from the past 24 hour(s))  Brain natriuretic peptide     Status: Abnormal   Collection Time: 02/25/22  2:20 PM  Result Value Ref Range   B Natriuretic Peptide 690.8 (H) 0.0 - 100.0 pg/mL  Comprehensive metabolic panel     Status: Abnormal   Collection Time: 02/25/22  2:20 PM  Result Value Ref Range   Sodium 135 135 - 145 mmol/L   Potassium 3.6 3.5 - 5.1 mmol/L   Chloride 103 98 - 111 mmol/L   CO2 21 (L) 22 - 32 mmol/L   Glucose, Bld 198 (H) 70 - 99 mg/dL   BUN 28 (H) 8 - 23 mg/dL   Creatinine, Ser 1.47 (H) 0.44 - 1.00 mg/dL   Calcium 9.1 8.9 - 10.3 mg/dL   Total Protein 6.1 (L) 6.5 - 8.1 g/dL   Albumin 3.1 (L) 3.5 - 5.0 g/dL   AST 22 15 - 41 U/L   ALT 15 0 - 44 U/L  Alkaline Phosphatase 46 38 - 126 U/L   Total Bilirubin 0.5 0.3 - 1.2 mg/dL   GFR, Estimated 36 (L) >60 mL/min   Anion gap 11 5 - 15  CBC with Differential     Status: Abnormal   Collection Time:  02/25/22  2:20 PM  Result Value Ref Range   WBC 12.2 (H) 4.0 - 10.5 K/uL   RBC 3.58 (L) 3.87 - 5.11 MIL/uL   Hemoglobin 9.5 (L) 12.0 - 15.0 g/dL   HCT 31.9 (L) 36.0 - 46.0 %   MCV 89.1 80.0 - 100.0 fL   MCH 26.5 26.0 - 34.0 pg   MCHC 29.8 (L) 30.0 - 36.0 g/dL   RDW 15.8 (H) 11.5 - 15.5 %   Platelets 281 150 - 400 K/uL   nRBC 0.0 0.0 - 0.2 %   Neutrophils Relative % 72 %   Neutro Abs 8.8 (H) 1.7 - 7.7 K/uL   Lymphocytes Relative 17 %   Lymphs Abs 2.1 0.7 - 4.0 K/uL   Monocytes Relative 7 %   Monocytes Absolute 0.9 0.1 - 1.0 K/uL   Eosinophils Relative 2 %   Eosinophils Absolute 0.2 0.0 - 0.5 K/uL   Basophils Relative 1 %   Basophils Absolute 0.1 0.0 - 0.1 K/uL   Immature Granulocytes 1 %   Abs Immature Granulocytes 0.11 (H) 0.00 - 0.07 K/uL  Troponin I (High Sensitivity)     Status: Abnormal   Collection Time: 02/25/22  2:20 PM  Result Value Ref Range   Troponin I (High Sensitivity) 18 (H) <18 ng/L  Resp panel by RT-PCR (RSV, Flu A&B, Covid) Anterior Nasal Swab     Status: None   Collection Time: 02/25/22  2:41 PM   Specimen: Anterior Nasal Swab  Result Value Ref Range   SARS Coronavirus 2 by RT PCR NEGATIVE NEGATIVE   Influenza A by PCR NEGATIVE NEGATIVE   Influenza B by PCR NEGATIVE NEGATIVE   Resp Syncytial Virus by PCR NEGATIVE NEGATIVE   *Note: Due to a large number of results and/or encounters for the requested time period, some results have not been displayed. A complete set of results can be found in Results Review.     Assessment and Plan:    Acute on chronic systolic and diastolic heart failure  Admit to progressive bed.  CXR showed mild CHF.  Started her on IV lasix 40 mg IV BID.  Continue with strict intake and output, daily weights.  Last echocardiogram in 2022 showed LVEF of 40 to 45%. Left diastolic function could not be evaluated.  Repeat echocardiogram ordered.  Elevated BNP 690 .    Slurred speech at home  the last 3 days: Currently speech is  back to baseline . She denies any weakness or sensory deficits.  CT head without contrast ordered by EDP,. Patient refused  to undergo CT or MRI Brain.  Son at bedside. Understands that she is refusing the test.  Patient is already on Eliquis.  Slp eval ordered.   Acute metabolic encephalopathy: from CHF? In the setting of schizophrenia, and medication non compliance.  She is refusing CT head.  Get ammonia level.  TSH wnl.  Patient on depakote and zyprexa, continue the same.     Elevated troponins ,  Mild ly elevated , flat.  EKG is paced.  She denies any chest pain.  Suspect from demand ischemia from CHF.   Gout:  On allopurinol.   COPD; No wheezing heard on exam.  Resume duonebs  prn if she is agreeable.    Paroxysmal Atrial fibrillation :  S/p cardioversion in January 2023.  Rate controlled. On eliquis for anti coagulation.    Hypertension:  Not well controlled.  Restarted home meds   Anemia of chronic disease:  Monitor.    Leukocytosis:  Unclear etiology.  Check UA.    Body mass index is 46.12 kg/m. Morbid obesity:   H/o schizophrenia and psychosis:  Resume home meds.   SS Dysfunction and  CHB s/p PPM Outpatient follow up .    Obstructive sleep apnea not on CPAP at home.   Infrarenal AAA:  Outpatient follow up with vascular surgery/ cardiology.   Type 2 DM, diet controlled.  Continue with SSI.    Hyperlipidemia; Continue with statin.     Advance Care Planning:   Code Status: Full Code   Consults: cardiology will be consulted in am.   Family Communication: discussed with SON at bedside.   Severity of Illness: The appropriate patient status for this patient is INPATIENT. Inpatient status is judged to be reasonable and necessary in order to provide the required intensity of service to ensure the patient's safety. The patient's presenting symptoms, physical exam findings, and initial radiographic and laboratory data in the context of their  chronic comorbidities is felt to place them at high risk for further clinical deterioration. Furthermore, it is not anticipated that the patient will be medically stable for discharge from the hospital within 2 midnights of admission.   * I certify that at the point of admission it is my clinical judgment that the patient will require inpatient hospital care spanning beyond 2 midnights from the point of admission due to high intensity of service, high risk for further deterioration and high frequency of surveillance required.*  Author: Hosie Poisson, MD 02/25/2022 6:37 PM  For on call review [email or web address].

## 2022-02-25 NOTE — ED Notes (Signed)
ED TO INPATIENT HANDOFF REPORT  ED Nurse Name and Phone #:  [facility] [place]  S Name/Age/Gender Abigail Wallace 77 y.o. female Room/Bed: WA15/WA15  Code Status   Code Status: Full Code  Home/SNF/Other TBD Patient oriented to: self, place, time, and situation Is this baseline? Yes   Triage Complete: Triage complete  Chief Complaint Pulmonary edema [J81.1]  Triage Note Patient brought in from home by EMS with c/o SOB and increased fluid retention. She has a HX of schizophrenia with increasing confusing x 1 month, recently seen by PCP and was told she had sun downers but has not been DX with dementia. She is A&Ox4, refused breathing TX by EMS.  165/70 97% RA 70 CBG: 178   Allergies Allergies  Allergen Reactions   Abilify [Aripiprazole] Anaphylaxis, Shortness Of Breath and Other (See Comments)    Tremors   Clozapine Anaphylaxis   Benadryl [Diphenhydramine Hcl] Other (See Comments)    States affects her vision   Cogentin [Benztropine] Other (See Comments)    Affects mobility   Haloperidol Lactate Other (See Comments)    Vision changes, Shaking, Drooling   Latuda [Lurasidone Hcl] Other (See Comments)    "Tremors and shakes."    Lorazepam     Other reaction(s): Unknown   Remeron [Mirtazapine] Other (See Comments)    Tremors and shakes   Keflex [Cephalexin]     Per patient, has tongue swelling with keflex but can tolerate amoxicillin   Codeine Other (See Comments)    Unknown    Latex Rash    Level of Care/Admitting Diagnosis ED Disposition     ED Disposition  Admit   Condition  --   [place]: [place] [[id number]]  Level of Care: Progressive [102]  Admit to Progressive based on following criteria: MULTISYSTEM THREATS such as stable sepsis, metabolic/electrolyte imbalance with or without encephalopathy that is responding to early treatment.  May admit patient to [facility] or [facility] if equivalent level of care is  available:: Yes  Covid Evaluation: Asymptomatic - no recent exposure (last 10 days) testing not required  Diagnosis: Pulmonary edema [id number]  Admitting Physician: Hosie Poisson [[id number]  Attending Physician: Hosie Poisson [id number]  Certification:: I certify this patient will need inpatient services for at least 2 midnights  Estimated Length of Stay: 3          B Medical/Surgery History Past Medical History:  Diagnosis Date   Abdominal wall hernia 01/2017   Advanced care planning/counseling discussion 09/29/2021   Atrial fibrillation ([place])    CHF (congestive heart failure) ([place])    CKD (chronic kidney disease)    COPD (chronic obstructive pulmonary disease) ([place])    Coronary artery disease    NON-CRITICAL   Ectasis aorta ([place])    GERD (gastroesophageal reflux disease)    Gout    Hypertension    Memory difficulties 12/02/2013   Pacemaker    Psychosis ([place])    Tardive dyskinesia 12/02/2013   Type 2 diabetes mellitus ([place])    Past Surgical History:  Procedure Laterality Date   CARDIAC CATHETERIZATION  2007   Non-critical.    CARDIOVERSION N/A 01/27/2021   Procedure: CARDIOVERSION;  Surgeon: Geralynn Rile, MD;  Location: [facility];  Service: Cardiovascular;  Laterality: N/A;   CHOLECYSTECTOMY     ELBOW SURGERY     EP IMPLANTABLE DEVICE N/A 08/31/2014   Procedure: PPM Generator Changeout;  Surgeon: Sanda Klein, MD;  Location: [facility];  Service:  Cardiovascular;  Laterality: N/A;   INSERT / REPLACE / REMOVE PACEMAKER  2007   [place]/SYMPTOMATIC HEART BLOCK   PACEMAKER PLACEMENT  09/28/2005   2/2 SSS?   Persantine stress test  05/01/2010   EF 66%. Normal LV sys fx. Unchanged from previous studies.    TRANSTHORACIC ECHOCARDIOGRAM  09/08/10   SEVERE CONCENTRIC HYPERTROPHY.LV FUNCTION WAS VIGOROUS.EF 65%-70%.VENTRICULAR SEPTUM-INCOORDINATE MOTION.LEFT ATRIUM-MILDLY DILATED.TRIVIAL TR.   TUBAL LIGATION       A IV Location/Drains/Wounds Patient  Lines/Drains/Airways Status     Active Line/Drains/Airways     Name Placement date Placement time Site Days   Peripheral IV 02/25/22 20 G 1.88" Anterior;Left Forearm 02/25/22  1436  Forearm  less than 1   Wound / Incision (Open or Dehisced) 03/15/21 (MASD) Moisture Associated Skin Damage Groin Right;Left Rash, redness 03/15/21  1530  Groin  347            Intake/Output Last 24 hours No intake or output data in the 24 hours ending 02/25/22 1843  Labs/Imaging Results for orders placed or performed during the hospital encounter of 02/25/22 (from the past 48 hour(s))  Brain natriuretic peptide     Status: Abnormal   Collection Time: 02/25/22  2:20 PM  Result Value Ref Range   B Natriuretic Peptide 690.8 (H) 0.0 - 100.0 pg/mL    Comment: Performed at [facility], [place] [address] [place], [place] [address]  Comprehensive metabolic panel     Status: Abnormal   Collection Time: 02/25/22  2:20 PM  Result Value Ref Range   Sodium 135 135 - 145 mmol/L   Potassium 3.6 3.5 - 5.1 mmol/L   Chloride 103 98 - 111 mmol/L   CO2 21 (L) 22 - 32 mmol/L   Glucose, Bld 198 (H) 70 - 99 mg/dL    Comment: Glucose reference range applies only to samples taken after fasting for at least 8 hours.   BUN 28 (H) 8 - 23 mg/dL   Creatinine, Ser 1.47 (H) 0.44 - 1.00 mg/dL   Calcium 9.1 8.9 - 10.3 mg/dL   Total Protein 6.1 (L) 6.5 - 8.1 g/dL   Albumin 3.1 (L) 3.5 - 5.0 g/dL   AST 22 15 - 41 U/L   ALT 15 0 - 44 U/L   Alkaline Phosphatase 46 38 - 126 U/L   Total Bilirubin 0.5 0.3 - 1.2 mg/dL   GFR, Estimated 36 (L) >60 mL/min    Comment: (NOTE) Calculated using the CKD-EPI Creatinine Equation (2021)    Anion gap 11 5 - 15    Comment: Performed at [facility], [place] [address], [place], [place] [address]  CBC with Differential     Status: Abnormal   Collection Time: 02/25/22  2:20 PM  Result Value Ref Range   WBC 12.2 (H) 4.0 - 10.5 K/uL   RBC 3.58 (L) 3.87 - 5.11  MIL/uL   Hemoglobin 9.5 (L) 12.0 - 15.0 g/dL   HCT 31.9 (L) 36.0 - 46.0 %   MCV 89.1 80.0 - 100.0 fL   MCH 26.5 26.0 - 34.0 pg   MCHC 29.8 (L) 30.0 - 36.0 g/dL   RDW 15.8 (H) 11.5 - 15.5 %   Platelets 281 150 - 400 K/uL   nRBC 0.0 0.0 - 0.2 %   Neutrophils Relative % 72 %   Neutro Abs 8.8 (H) 1.7 - 7.7 K/uL   Lymphocytes Relative 17 %   Lymphs Abs 2.1 0.7 - 4.0 K/uL  Monocytes Relative 7 %   Monocytes Absolute 0.9 0.1 - 1.0 K/uL   Eosinophils Relative 2 %   Eosinophils Absolute 0.2 0.0 - 0.5 K/uL   Basophils Relative 1 %   Basophils Absolute 0.1 0.0 - 0.1 K/uL   Immature Granulocytes 1 %   Abs Immature Granulocytes 0.11 (H) 0.00 - 0.07 K/uL    Comment: Performed at [facility], [place] [address] [place], [place] [address]  Troponin I (High Sensitivity)     Status: Abnormal   Collection Time: 02/25/22  2:20 PM  Result Value Ref Range   Troponin I (High Sensitivity) 18 (H) <18 ng/L    Comment: (NOTE) Elevated high sensitivity troponin I (hsTnI) values and significant  changes across serial measurements may suggest ACS but many other  chronic and acute conditions are known to elevate hsTnI results.  Refer to the "Links" section for chest pain algorithms and additional  guidance. Performed at [facility], [place] [address], [place],  [address]   Resp panel by RT-PCR (RSV, Flu A&B, Covid) Anterior Nasal Swab     Status: None   Collection Time: 02/25/22  2:41 PM   Specimen: Anterior Nasal Swab  Result Value Ref Range   SARS Coronavirus 2 by RT PCR NEGATIVE NEGATIVE    Comment: (NOTE) SARS-CoV-2 target nucleic acids are NOT DETECTED.  The SARS-CoV-2 RNA is generally detectable in upper respiratory specimens during the acute phase of infection. The lowest concentration of SARS-CoV-2 viral copies this assay can detect is 138 copies/mL. A negative result does not preclude SARS-Cov-2 infection and should not be used as the sole basis for  treatment or other patient management decisions. A negative result may occur with  improper specimen collection/handling, submission of specimen other than nasopharyngeal swab, presence of viral mutation(s) within the areas targeted by this assay, and inadequate number of viral copies(<138 copies/mL). A negative result must be combined with clinical observations, patient history, and epidemiological information. The expected result is Negative.  Fact Sheet for Patients:  EntrepreneurPulse.com.au  Fact Sheet for Healthcare Providers:  IncredibleEmployment.be  This test is no t yet approved or cleared by the [place] FDA and  has been authorized for detection and/or diagnosis of SARS-CoV-2 by FDA under an Emergency Use Authorization (EUA). This EUA will remain  in effect (meaning this test can be used) for the duration of the COVID-19 declaration under Section 564(b)(1) of the Act, 21 U.S.C.section 360bbb-3(b)(1), unless the authorization is terminated  or revoked sooner.       Influenza A by PCR NEGATIVE NEGATIVE   Influenza B by PCR NEGATIVE NEGATIVE    Comment: (NOTE) The Xpert Xpress SARS-CoV-2/FLU/RSV plus assay is intended as an aid in the diagnosis of influenza from Nasopharyngeal swab specimens and should not be used as a sole basis for treatment. Nasal washings and aspirates are unacceptable for Xpert Xpress SARS-CoV-2/FLU/RSV testing.  Fact Sheet for Patients: EntrepreneurPulse.com.au  Fact Sheet for Healthcare Providers: IncredibleEmployment.be  This test is not yet approved or cleared by the [place] FDA and has been authorized for detection and/or diagnosis of SARS-CoV-2 by FDA under an Emergency Use Authorization (EUA). This EUA will remain in effect (meaning this test can be used) for the duration of the COVID-19 declaration under Section 564(b)(1) of the Act, 21 U.S.C. section  360bbb-3(b)(1), unless the authorization is terminated or revoked.     Resp Syncytial Virus by PCR NEGATIVE NEGATIVE    Comment: (NOTE) Fact Sheet for Patients:  EntrepreneurPulse.com.au  Fact Sheet for Healthcare Providers: IncredibleEmployment.be  This test is not yet approved or cleared by the [place] FDA and has been authorized for detection and/or diagnosis of SARS-CoV-2 by FDA under an Emergency Use Authorization (EUA). This EUA will remain in effect (meaning this test can be used) for the duration of the COVID-19 declaration under Section 564(b)(1) of the Act, 21 U.S.C. section 360bbb-3(b)(1), unless the authorization is terminated or revoked.  Performed at [facility], [place] [address], [place], [place] [address]    *Note: Due to a large number of results and/or encounters for the requested time period, some results have not been displayed. A complete set of results can be found in Results Review.   DG Chest Port 1 View  Result Date: 02/25/2022 CLINICAL DATA:  Shortness of breath EXAM: PORTABLE CHEST 1 VIEW COMPARISON:  12/29/2021 FINDINGS: Left-sided implanted cardiac device. Cardiomegaly. Aortic atherosclerosis. Pulmonary vascular congestion. Mild interstitial prominence. No pleural effusion or pneumothorax. IMPRESSION: Findings suggestive of CHF with mild edema. Electronically Signed   By: Davina Poke D.O.   On: 02/25/2022 15:06    Pending Labs Unresulted Labs (From admission, onward)     Start     Ordered   Signed and Held  Basic metabolic panel  Daily,   R      Signed and Held   Signed and Held  CBC  Daily,   R      Signed and Held            Vitals/Pain Today's Vitals   02/25/22 1354 02/25/22 1356 02/25/22 1357 02/25/22 1522  BP: (!) 90/56   (!) 152/114  Pulse: 70   69  Resp: (!) 25   19  Temp: 97.9 F (36.6 C)     TempSrc: Oral     SpO2: 97%   97%  Weight:   127 kg   Height:   5' 6"$   (1.676 m)   PainSc:  0-No pain      Isolation Precautions Airborne and Contact precautions  Medications Medications  furosemide (LASIX) injection 80 mg (80 mg Intravenous Given 02/25/22 1529)    Mobility non-ambulatory     Focused Assessments Neuro Assessment Handoff:  Swallow screen pass?  N/A Cardiac Rhythm: Normal sinus rhythm       Neuro Assessment:   Neuro Checks:      Has TPA been given? No If patient is a Neuro Trauma and patient is going to OR before floor call report to [place] nurse: [phone] or [phone]   R Recommendations: See Admitting Provider Note  Report given to:   Additional Notes: N/A

## 2022-02-25 NOTE — ED Notes (Addendum)
Pt refused CT & second Troponin, will attempt later

## 2022-02-25 NOTE — ED Triage Notes (Signed)
Patient brought in from home by EMS with c/o SOB and increased fluid retention. She has a HX of schizophrenia with increasing confusing x 1 month, recently seen by PCP and was told she had sun downers but has not been DX with dementia. She is A&Ox4, refused breathing TX by EMS.  165/70 97% RA 70 CBG: 178

## 2022-02-26 ENCOUNTER — Inpatient Hospital Stay (HOSPITAL_COMMUNITY): Payer: 59

## 2022-02-26 ENCOUNTER — Encounter (HOSPITAL_COMMUNITY): Payer: Self-pay | Admitting: Internal Medicine

## 2022-02-26 DIAGNOSIS — R4781 Slurred speech: Secondary | ICD-10-CM | POA: Diagnosis not present

## 2022-02-26 DIAGNOSIS — I5031 Acute diastolic (congestive) heart failure: Secondary | ICD-10-CM | POA: Diagnosis not present

## 2022-02-26 DIAGNOSIS — F209 Schizophrenia, unspecified: Secondary | ICD-10-CM | POA: Diagnosis not present

## 2022-02-26 DIAGNOSIS — F201 Disorganized schizophrenia: Secondary | ICD-10-CM | POA: Diagnosis not present

## 2022-02-26 DIAGNOSIS — I509 Heart failure, unspecified: Secondary | ICD-10-CM | POA: Diagnosis not present

## 2022-02-26 DIAGNOSIS — I10 Essential (primary) hypertension: Secondary | ICD-10-CM | POA: Diagnosis not present

## 2022-02-26 DIAGNOSIS — J810 Acute pulmonary edema: Secondary | ICD-10-CM | POA: Diagnosis not present

## 2022-02-26 LAB — TROPONIN I (HIGH SENSITIVITY): Troponin I (High Sensitivity): 22 ng/L — ABNORMAL HIGH (ref <18)

## 2022-02-26 LAB — CBC
HCT: 33.6 % — ABNORMAL LOW (ref 36.0–46.0)
Hemoglobin: 10.2 g/dL — ABNORMAL LOW (ref 12.0–15.0)
MCH: 26.4 pg (ref 26.0–34.0)
MCHC: 30.4 g/dL (ref 30.0–36.0)
MCV: 86.8 fL (ref 80.0–100.0)
Platelets: 316 10*3/uL (ref 150–400)
RBC: 3.87 MIL/uL (ref 3.87–5.11)
RDW: 15.9 % — ABNORMAL HIGH (ref 11.5–15.5)
WBC: 11.3 10*3/uL — ABNORMAL HIGH (ref 4.0–10.5)
nRBC: 0 % (ref 0.0–0.2)

## 2022-02-26 LAB — BASIC METABOLIC PANEL
Anion gap: 8 (ref 5–15)
BUN: 30 mg/dL — ABNORMAL HIGH (ref 8–23)
CO2: 24 mmol/L (ref 22–32)
Calcium: 9.3 mg/dL (ref 8.9–10.3)
Chloride: 106 mmol/L (ref 98–111)
Creatinine, Ser: 1.49 mg/dL — ABNORMAL HIGH (ref 0.44–1.00)
GFR, Estimated: 36 mL/min — ABNORMAL LOW (ref >60)
Glucose, Bld: 138 mg/dL — ABNORMAL HIGH (ref 70–99)
Potassium: 3.2 mmol/L — ABNORMAL LOW (ref 3.5–5.1)
Sodium: 138 mmol/L (ref 135–145)

## 2022-02-26 LAB — ECHOCARDIOGRAM COMPLETE
Area-P 1/2: 3.31 cm2
Height: 66 in
S' Lateral: 2.9 cm
Weight: 4539.71 oz

## 2022-02-26 LAB — GLUCOSE, CAPILLARY
Glucose-Capillary: 115 mg/dL — ABNORMAL HIGH (ref 70–99)
Glucose-Capillary: 132 mg/dL — ABNORMAL HIGH (ref 70–99)
Glucose-Capillary: 145 mg/dL — ABNORMAL HIGH (ref 70–99)
Glucose-Capillary: 132 mg/dL — ABNORMAL HIGH (ref 70–99)

## 2022-02-26 LAB — VITAMIN B12: Vitamin B-12: 814 pg/mL (ref 180–914)

## 2022-02-26 LAB — HEMOGLOBIN A1C
Hgb A1c MFr Bld: 5.8 % — ABNORMAL HIGH (ref 4.8–5.6)
Mean Plasma Glucose: 119.76 mg/dL

## 2022-02-26 LAB — AMMONIA: Ammonia: 11 umol/L (ref 9–35)

## 2022-02-26 MED ORDER — ADULT MULTIVITAMIN W/MINERALS CH
1.0000 | ORAL_TABLET | Freq: Every day | ORAL | Status: DC
  Administered 2022-02-27 – 2022-03-05 (×7): 1 via ORAL
  Filled 2022-02-26 (×7): qty 1

## 2022-02-26 MED ORDER — POTASSIUM CHLORIDE CRYS ER 20 MEQ PO TBCR
40.0000 meq | EXTENDED_RELEASE_TABLET | Freq: Two times a day (BID) | ORAL | Status: AC
  Administered 2022-02-26 (×2): 40 meq via ORAL
  Filled 2022-02-26 (×2): qty 2

## 2022-02-26 MED ORDER — HALOPERIDOL LACTATE 5 MG/ML IJ SOLN
2.0000 mg | Freq: Four times a day (QID) | INTRAMUSCULAR | Status: DC | PRN
  Administered 2022-02-26: 2 mg via INTRAVENOUS
  Filled 2022-02-26: qty 1

## 2022-02-26 MED ORDER — DIVALPROEX SODIUM ER 250 MG PO TB24
250.0000 mg | ORAL_TABLET | Freq: Two times a day (BID) | ORAL | Status: DC
  Administered 2022-02-26: 250 mg via ORAL
  Filled 2022-02-26 (×2): qty 1

## 2022-02-26 NOTE — Progress Notes (Signed)
Made on call provider aware that patient is refusing labs.

## 2022-02-26 NOTE — Evaluation (Signed)
Physical Therapy Evaluation Patient Details Name: Abigail Wallace MRN: [id number] DOB: 07/16/1945 Today's Date: 02/26/2022  History of Present Illness  Patient is a 77 year old female who presented with slurred speech, shortness of breath and confusion. Patient was admitted with acute on chronic systolic and diastolic heart failure, acute metabolic encephalopathy, and elevated troponin. PMH: schizophrenia, CHF, HTN, CKD IV, COPD, A fib, DM II, infrarenal AAA, hernia  Clinical Impression  On eval, pt required Max A for bed mobility and Supv for static sitting balance at EOB. Multimodal repeated cueing and encouragement from therapist and son for participation. Pt put forth good effort with time and encouragement. She did follow 1 step commands. Pt presents with general weakness, decreased activity tolerance and impaired balance. PT recommendation is for ST SNF.        Recommendations for follow up therapy are one component of a multi-disciplinary discharge planning process, led by the attending physician.  Recommendations may be updated based on patient status, additional functional criteria and insurance authorization.  Follow Up Recommendations Skilled nursing-short term rehab (<3 hours/day) Can patient physically be transported by private vehicle: No    Assistance Recommended at Discharge Frequent or constant Supervision/Assistance  Patient can return home with the following  Direct supervision/assist for financial management;A lot of help with bathing/dressing/bathroom;Two people to help with walking and/or transfers;Assistance with cooking/housework;Assist for transportation;Help with stairs or ramp for entrance    Equipment Recommendations None recommended by PT  Recommendations for Other Services       Functional Status Assessment Patient has had a recent decline in their functional status and demonstrates the ability to make significant improvements in function in a reasonable and  predictable amount of time.     Precautions / Restrictions Precautions Precautions: Fall Restrictions Weight Bearing Restrictions: No      Mobility  Bed Mobility Overal bed mobility: Needs Assistance Bed Mobility: Rolling, Sidelying to Sit, Sit to Sidelying Rolling: Max assist Sidelying to sit: Max assist     Sit to sidelying: Min assist General bed mobility comments: Assist for trunk and bil LEs. HOB elevated ~50 degrees. Repeated multimodal cueing from therapist and son. Increased time. Sat EOB for ~5 minutes with Supv level assist.    Transfers                   General transfer comment: Deferred for safety reasons-only +1 assist available. Pt also stated " I can't stand" in an anxious manner    Ambulation/Gait                  Stairs            Wheelchair Mobility    Modified Rankin (Stroke Patients Only)       Balance Overall balance assessment: Needs assistance, History of Falls Sitting-balance support: Feet supported Sitting balance-Leahy Scale: Good                                       Pertinent Vitals/Pain Pain Assessment Pain Assessment: Faces Faces Pain Scale: Hurts little more Pain Location: back Pain Descriptors / Indicators: Sore, Discomfort Pain Intervention(s): Limited activity within patient's tolerance    Home Living Family/patient expects to be discharged to:: Unsure Living Arrangements: Children Available Help at Discharge: Family;Available PRN/intermittently Type of Home: Apartment Home Access: Level entry       Home Layout: One level Home Equipment: Shower seat;BSC/3in1;Tub  bench Additional Comments: wont use the tub bench that she has at home.    Prior Function Prior Level of Function : Needs assist             Mobility Comments: has not walked in 5 years. PLOF was porvided by son Via phone call (per OT note) ADLs Comments: was getting out of bed on her own to w/c holding bed rail  and getting to toilet. patients son helps with getting in and out of tub every other day.     Hand Dominance   Dominant Hand: Right    Extremity/Trunk Assessment   Upper Extremity Assessment Upper Extremity Assessment: Defer to OT evaluation    Lower Extremity Assessment Lower Extremity Assessment: Generalized weakness (unable to fully assess 2* cognition. she was able to extend lower legs against gravity so at least 3/5 quad strength)       Communication   Communication: No difficulties  Cognition Arousal/Alertness: Awake/alert Behavior During Therapy: Anxious, Flat affect Overall Cognitive Status: Difficult to assess                                 General Comments: pt will quickly state "I can't." son was present to encourage        General Comments      Exercises     Assessment/Plan    PT Assessment Patient needs continued PT services  PT Problem List Decreased strength;Decreased range of motion;Decreased activity tolerance;Decreased balance;Decreased mobility;Obesity;Decreased skin integrity       PT Treatment Interventions DME instruction;Gait training;Therapeutic exercise;Balance training;Therapeutic activities;Patient/family education;Functional mobility training    PT Goals (Current goals can be found in the Care Plan section)  Acute Rehab PT Goals Patient Stated Goal: none stated PT Goal Formulation: With patient Time For Goal Achievement: 03/12/22 Potential to Achieve Goals: Fair    Frequency Min 2X/week     Co-evaluation               AM-PAC PT "6 Clicks" Mobility  Outcome Measure Help needed turning from your back to your side while in a flat bed without using bedrails?: A Lot Help needed moving from lying on your back to sitting on the side of a flat bed without using bedrails?: A Lot Help needed moving to and from a bed to a chair (including a wheelchair)?: Total Help needed standing up from a chair using your arms  (e.g., wheelchair or bedside chair)?: Total Help needed to walk in hospital room?: Total Help needed climbing 3-5 steps with a railing? : Total 6 Click Score: 8    End of Session   Activity Tolerance: Patient tolerated treatment well Patient left: in bed;with call bell/phone within reach;with bed alarm set;with family/visitor present   PT Visit Diagnosis: History of falling (Z91.81);Muscle weakness (generalized) (M62.81);Other abnormalities of gait and mobility (R26.89)    Time: [id number] PT Time Calculation (min) (ACUTE ONLY): 16 min   Charges:   PT Evaluation $PT Eval Moderate Complexity: [place], PT Acute Rehabilitation  Office: [phone]

## 2022-02-26 NOTE — Consult Note (Signed)
[facility] Consult   Reason for Consult: History of schizophrenia, psychosis, refusing care and medications.  Referring Physician: Dr. Karleen Hampshire Patient Identification: Abigail Wallace MRN:  [id number] Principal Diagnosis: Pulmonary edema Diagnosis:  Principal Problem:   Pulmonary edema   Total Time spent with patient: 1 hour  Subjective:   Abigail Wallace is a 77 y.o. female patient admitted with slurred speech, shortness of breath, and confusion.  Patient does have past medical history of acute on chronic systolic and diastolic heart failure, acute metabolic encephalopathy, elevated troponin, hypertension, CKD stage IV, COPD, intrarenal AAA atrial fibrillation, and diabetes.  Chart review shows patient has history of schizophrenia, symptoms consistent with mild cognitive impairment that has been progressively getting worse over the past 15 years.  Patient denies most if not all of her psychiatric conditions, symptom presentation.  Son who is present is patient's primary caregiver.  Most of the evaluation was completed by family members, as patient did display some trust issues, forgetfulness, confusion at times with intermittent waxing and waning of impairment.  Patient's family does report history of hallucinations both auditory, visual, and tactile.  Patient's daughter-in-law Abigail Wallace, also reports increase in delusions and paranoia.  This was seen while provider at bedside, as patient wanted to witness the nurse providing a new cup of water.  Patient refused to drink previous cup of water despite recently filling for medication.  As noted above patient with history of worsening cognitive decline, recently mentioned by outpatient provider "sundowning", however patient declines having sundowning, in addition to diagnosis of schizophrenia.  Throughout the interview patient's cognitive impairment did wax and wane, as she was noted to have perfect memory recall and engage in her decision  making.  Patient states "they just completed a CT scan, I will not do another one.  I do not know why they need another 1."  When trying to explain differences between CT scan versus MRI, patient continued to deny expressing any interest in additional imaging.  Son was therefore encouraged to have mother reconsider.  There does appear to be good report between mother and son, throughout the evaluation patient did ask for her son to hold her hand several times.  Son also reports since symptoms have increased, patient has not been sleeping, and they have went about 7 days with no sleep.  Did discuss with son mental exhaustion and insomnia can increase psychosis in addition to her multiple comorbidities that are currently taking place.  Patient does deny suicidal ideations, homicidal ideations, and or auditory or visual hallucinations.  Complete psychiatric evaluation, a portion of this psychiatric evaluation has been deferred due to patient recently being medicated, restless, and not being open to treatment.  Son is interested in taking mother back home, however is interested in rehabilitation. Major neurocognitive disorder is evidenced by profound cognitive decline in one or more areas including, but not limited to, memory, attention, language, and executive function. Cognitive decline should be supported by clinical assessments and neuropsychological testing, and the decline should represent impairment in one's ability to perform activities of daily living. Memory impairment is a prominent early symptom and is required to make a diagnosis of dementia. Another symptom is a decreased ability to learn new information and/or forgetting previously learned material. Deterioration in language function (aphasia) is often seen when the patient has difficulty in providing names of items and people. Late stages of dementia are often characterized by echolalia (echoing what is heard).6  Behavioral Disturbances While  dementia is well  recognized as a disorder that progressively deteriorates, the associated behavioral disturbances may unnecessarily hasten a patient's loss of quality of life as well as lead to inpatient hospitalization and institutionalization (placement in a long-term care facility). These behavioral manifestations in dementia include agitation, aggression, psychosis, and purposeless wandering. Delusions and disruptive behaviors including aggression and screaming are reported to be among the most disturbing to caregivers. Often these behaviors worsen at night and have been termed sundowning, linking the worsening behaviors with the time the sun sets in the evening. Disinhibition also can be seen in this population, which can be worsened by drug therapy such as antihistamines and benzodiazepines. For patients experiencing early signs of dementia, mood changes and increased anxiety may actually be more prominent than memory loss, so healthcare providers and family members should be alerted to report and document such changes.    The above information was discussed with patient and family throughout psychiatric assessment.  Did discuss with family, concerns about psychotropic medication and her current heart.  Will reach out to cardiology for recommendations at this time.  HPI:    Past Psychiatric History: Patient denies, however family at bedside endorses history of schizophrenia.  Son states patient is under the care of Abigail Wallace.  She is currently being managed with Depakote 250 mg p.o. nightly.  She notably was taken olanzapine 7.5 mg p.o. nightly, however last filled history unknown does not appear to be compliant with such.  Risk to Self:  Denies Risk to Others:  Denies Prior Inpatient Therapy:  Denies Prior Outpatient Therapy:  Abigail Wallace per son  Past Medical History:  Past Medical History:  Diagnosis Date   Abdominal wall hernia 01/2017   Advanced care planning/counseling discussion  09/29/2021   Atrial fibrillation (HCC)    CHF (congestive heart failure) ([place])    CKD (chronic kidney disease)    COPD (chronic obstructive pulmonary disease) ([place])    Coronary artery disease    NON-CRITICAL   Ectasis aorta (HCC)    GERD (gastroesophageal reflux disease)    Gout    Hypertension    Memory difficulties 12/02/2013   Pacemaker    Psychosis ([place])    Tardive dyskinesia 12/02/2013   Type 2 diabetes mellitus ([place])     Past Surgical History:  Procedure Laterality Date   CARDIAC CATHETERIZATION  2007   Non-critical.    CARDIOVERSION N/A 01/27/2021   Procedure: CARDIOVERSION;  Surgeon: Geralynn Rile, MD;  Location: [facility];  Service: Cardiovascular;  Laterality: N/A;   CHOLECYSTECTOMY     ELBOW SURGERY     EP IMPLANTABLE DEVICE N/A 08/31/2014   Procedure: PPM Generator Changeout;  Surgeon: Sanda Klein, MD;  Location: [facility];  Service: Cardiovascular;  Laterality: N/A;   INSERT / REPLACE / REMOVE PACEMAKER  2007   [place]/SYMPTOMATIC HEART BLOCK   PACEMAKER PLACEMENT  09/28/2005   2/2 SSS?   Persantine stress test  05/01/2010   EF 66%. Normal LV sys fx. Unchanged from previous studies.    TRANSTHORACIC ECHOCARDIOGRAM  09/08/10   SEVERE CONCENTRIC HYPERTROPHY.LV FUNCTION WAS VIGOROUS.EF 65%-70%.VENTRICULAR SEPTUM-INCOORDINATE MOTION.LEFT ATRIUM-MILDLY DILATED.TRIVIAL TR.   TUBAL LIGATION     Family History:  Family History  Problem Relation Age of Onset   Heart disease Mother    Heart disease Father    Stroke Sister    Diabetes type II Son    Family Psychiatric  History: Unable to assess Social History:  Social History   Substance and Sexual Activity  Alcohol Use No   Alcohol/week: 0.0 standard drinks of alcohol     Social History   Substance and Sexual Activity  Drug Use No    Social History   Socioeconomic History   Marital status: Divorced    Spouse name: Not on file   Number of children: 3   Years of education: 14    Highest education level: Associate degree: academic program  Occupational History   Not on file  Tobacco Use   Smoking status: Every Day    Types: Cigarettes    Last attempt to quit: 03/15/2021    Years since quitting: 0.9   Smokeless tobacco: Never   Tobacco comments:    1cig a day  Vaping Use   Vaping Use: Former  Substance and Sexual Activity   Alcohol use: No    Alcohol/week: 0.0 standard drinks of alcohol   Drug use: No   Sexual activity: Not Currently    Partners: Male    Birth control/protection: Post-menopausal    Comment: BTL  Other Topics Concern   Not on file  Social History Narrative   Patient lives with her son in [place] in a one story apartment.    Son is patients caregiver ([name].)    Patient is wheelchair bound, except for bedtime.    Patient enjoys listing to music and visiting family.    Social Determinants of Health   Financial Resource Strain: Low Risk  (12/27/2020)   Overall Financial Resource Strain (CARDIA)    Difficulty of Paying Living Expenses: Not hard at all  Food Insecurity: No Food Insecurity (02/25/2022)   Hunger Vital Sign    Worried About Running Out of Food in the Last Year: Never true    Ran Out of Food in the Last Year: Never true  Transportation Needs: No Transportation Needs (02/25/2022)   PRAPARE - Hydrologist (Medical): No    Lack of Transportation (Non-Medical): No  Physical Activity: Inactive (12/27/2020)   Exercise Vital Sign    Days of Exercise per Week: 0 days    Minutes of Exercise per Session: 0 min  Stress: No Stress Concern Present (12/27/2020)   [place]    Feeling of Stress : Only a little  Social Connections: Socially Isolated (12/27/2020)   Social Connection and Isolation Panel [NHANES]    Frequency of Communication with Friends and Family: More than three times a week    Frequency of Social Gatherings with Friends and  Family: More than three times a week    Attends Religious Services: Never    Marine scientist or Organizations: No    Attends Archivist Meetings: Never    Marital Status: Divorced   Additional Social History:    Allergies:   Allergies  Allergen Reactions   Aripiprazole Anaphylaxis, Other (See Comments) and Shortness Of Breath    Tremors   Clozapine Anaphylaxis   Benadryl [Diphenhydramine Hcl] Other (See Comments)    States affects her vision   Benztropine Other (See Comments)    Affects mobility   Latuda [Lurasidone Hcl] Other (See Comments)    "Tremors and shakes."    Lorazepam Other (See Comments)    Unknown reaction??   Remeron [Mirtazapine] Other (See Comments)    Tremors and shakes   Haloperidol Lactate Other (See Comments)    Unknown reaction??   Keflex [Cephalexin] Swelling and Other (See Comments)    Per patient, has  tongue swelling with keflex but can tolerate amoxicillin   Codeine Other (See Comments)    Unknown reaction??   Latex Rash    Labs:  Results for orders placed or performed during the hospital encounter of 02/25/22 (from the past 48 hour(s))  Brain natriuretic peptide     Status: Abnormal   Collection Time: 02/25/22  2:20 PM  Result Value Ref Range   B Natriuretic Peptide 690.8 (H) 0.0 - 100.0 pg/mL    Comment: Performed at [facility], [place] [address] [place], [place] [address]  Comprehensive metabolic panel     Status: Abnormal   Collection Time: 02/25/22  2:20 PM  Result Value Ref Range   Sodium 135 135 - 145 mmol/L   Potassium 3.6 3.5 - 5.1 mmol/L   Chloride 103 98 - 111 mmol/L   CO2 21 (L) 22 - 32 mmol/L   Glucose, Bld 198 (H) 70 - 99 mg/dL    Comment: Glucose reference range applies only to samples taken after fasting for at least 8 hours.   BUN 28 (H) 8 - 23 mg/dL   Creatinine, Ser 1.47 (H) 0.44 - 1.00 mg/dL   Calcium 9.1 8.9 - 10.3 mg/dL   Total Protein 6.1 (L) 6.5 - 8.1 g/dL   Albumin 3.1 (L) 3.5 -  5.0 g/dL   AST 22 15 - 41 U/L   ALT 15 0 - 44 U/L   Alkaline Phosphatase 46 38 - 126 U/L   Total Bilirubin 0.5 0.3 - 1.2 mg/dL   GFR, Estimated 36 (L) >60 mL/min    Comment: (NOTE) Calculated using the CKD-EPI Creatinine Equation (2021)    Anion gap 11 5 - 15    Comment: Performed at [facility], [place] [address], [place], [place] [address]  CBC with Differential     Status: Abnormal   Collection Time: 02/25/22  2:20 PM  Result Value Ref Range   WBC 12.2 (H) 4.0 - 10.5 K/uL   RBC 3.58 (L) 3.87 - 5.11 MIL/uL   Hemoglobin 9.5 (L) 12.0 - 15.0 g/dL   HCT 31.9 (L) 36.0 - 46.0 %   MCV 89.1 80.0 - 100.0 fL   MCH 26.5 26.0 - 34.0 pg   MCHC 29.8 (L) 30.0 - 36.0 g/dL   RDW 15.8 (H) 11.5 - 15.5 %   Platelets 281 150 - 400 K/uL   nRBC 0.0 0.0 - 0.2 %   Neutrophils Relative % 72 %   Neutro Abs 8.8 (H) 1.7 - 7.7 K/uL   Lymphocytes Relative 17 %   Lymphs Abs 2.1 0.7 - 4.0 K/uL   Monocytes Relative 7 %   Monocytes Absolute 0.9 0.1 - 1.0 K/uL   Eosinophils Relative 2 %   Eosinophils Absolute 0.2 0.0 - 0.5 K/uL   Basophils Relative 1 %   Basophils Absolute 0.1 0.0 - 0.1 K/uL   Immature Granulocytes 1 %   Abs Immature Granulocytes 0.11 (H) 0.00 - 0.07 K/uL    Comment: Performed at [facility], [place] [address], [place],  [address]  Troponin I (High Sensitivity)     Status: Abnormal   Collection Time: 02/25/22  2:20 PM  Result Value Ref Range   Troponin I (High Sensitivity) 18 (H) <18 ng/L    Comment: (NOTE) Elevated high sensitivity troponin I (hsTnI) values and significant  changes across serial measurements may suggest ACS but many other  chronic and acute conditions are known to elevate hsTnI results.  Refer  to the "Links" section for chest pain algorithms and additional  guidance. Performed at [facility], [place] [address] [place], [place] [address]   Resp panel by RT-PCR (RSV, Flu A&B, Covid) Anterior Nasal Swab      Status: None   Collection Time: 02/25/22  2:41 PM   Specimen: Anterior Nasal Swab  Result Value Ref Range   SARS Coronavirus 2 by RT PCR NEGATIVE NEGATIVE    Comment: (NOTE) SARS-CoV-2 target nucleic acids are NOT DETECTED.  The SARS-CoV-2 RNA is generally detectable in upper respiratory specimens during the acute phase of infection. The lowest concentration of SARS-CoV-2 viral copies this assay can detect is 138 copies/mL. A negative result does not preclude SARS-Cov-2 infection and should not be used as the sole basis for treatment or other patient management decisions. A negative result may occur with  improper specimen collection/handling, submission of specimen other than nasopharyngeal swab, presence of viral mutation(s) within the areas targeted by this assay, and inadequate number of viral copies(<138 copies/mL). A negative result must be combined with clinical observations, patient history, and epidemiological information. The expected result is Negative.  Fact Sheet for Patients:  EntrepreneurPulse.com.au  Fact Sheet for Healthcare Providers:  IncredibleEmployment.be  This test is no t yet approved or cleared by the [place] FDA and  has been authorized for detection and/or diagnosis of SARS-CoV-2 by FDA under an Emergency Use Authorization (EUA). This EUA will remain  in effect (meaning this test can be used) for the duration of the COVID-19 declaration under Section 564(b)(1) of the Act, 21 U.S.C.section 360bbb-3(b)(1), unless the authorization is terminated  or revoked sooner.       Influenza A by PCR NEGATIVE NEGATIVE   Influenza B by PCR NEGATIVE NEGATIVE    Comment: (NOTE) The Xpert Xpress SARS-CoV-2/FLU/RSV plus assay is intended as an aid in the diagnosis of influenza from Nasopharyngeal swab specimens and should not be used as a sole basis for treatment. Nasal washings and aspirates are unacceptable for Xpert Xpress  SARS-CoV-2/FLU/RSV testing.  Fact Sheet for Patients: EntrepreneurPulse.com.au  Fact Sheet for Healthcare Providers: IncredibleEmployment.be  This test is not yet approved or cleared by the [place] FDA and has been authorized for detection and/or diagnosis of SARS-CoV-2 by FDA under an Emergency Use Authorization (EUA). This EUA will remain in effect (meaning this test can be used) for the duration of the COVID-19 declaration under Section 564(b)(1) of the Act, 21 U.S.C. section 360bbb-3(b)(1), unless the authorization is terminated or revoked.     Resp Syncytial Virus by PCR NEGATIVE NEGATIVE    Comment: (NOTE) Fact Sheet for Patients: EntrepreneurPulse.com.au  Fact Sheet for Healthcare Providers: IncredibleEmployment.be  This test is not yet approved or cleared by the [place] FDA and has been authorized for detection and/or diagnosis of SARS-CoV-2 by FDA under an Emergency Use Authorization (EUA). This EUA will remain in effect (meaning this test can be used) for the duration of the COVID-19 declaration under Section 564(b)(1) of the Act, 21 U.S.C. section 360bbb-3(b)(1), unless the authorization is terminated or revoked.  Performed at [facility], [place] [address], [place], [place] [address]   Glucose, capillary     Status: Abnormal   Collection Time: 02/25/22  9:39 PM  Result Value Ref Range   Glucose-Capillary 120 (H) 70 - 99 mg/dL    Comment: Glucose reference range applies only to samples taken after fasting for at least 8 hours.  Glucose, capillary     Status:  Abnormal   Collection Time: 02/26/22  7:52 AM  Result Value Ref Range   Glucose-Capillary 115 (H) 70 - 99 mg/dL    Comment: Glucose reference range applies only to samples taken after fasting for at least 8 hours.  Troponin I (High Sensitivity)     Status: Abnormal   Collection Time: 02/26/22 12:46 PM   Result Value Ref Range   Troponin I (High Sensitivity) 22 (H) <18 ng/L    Comment: (NOTE) Elevated high sensitivity troponin I (hsTnI) values and significant  changes across serial measurements may suggest ACS but many other  chronic and acute conditions are known to elevate hsTnI results.  Refer to the "Links" section for chest pain algorithms and additional  guidance. Performed at [facility], [place] [address] [place], [place] [id number]   Basic metabolic panel     Status: Abnormal   Collection Time: 02/26/22 12:46 PM  Result Value Ref Range   Sodium 138 135 - 145 mmol/L   Potassium 3.2 (L) 3.5 - 5.1 mmol/L   Chloride 106 98 - 111 mmol/L   CO2 24 22 - 32 mmol/L   Glucose, Bld 138 (H) 70 - 99 mg/dL    Comment: Glucose reference range applies only to samples taken after fasting for at least 8 hours.   BUN 30 (H) 8 - 23 mg/dL   Creatinine, Ser 1.49 (H) 0.44 - 1.00 mg/dL   Calcium 9.3 8.9 - 10.3 mg/dL   GFR, Estimated 36 (L) >60 mL/min    Comment: (NOTE) Calculated using the CKD-EPI Creatinine Equation (2021)    Anion gap 8 5 - 15    Comment: Performed at [facility], [place] [address] [place], [place] [address]  CBC     Status: Abnormal   Collection Time: 02/26/22 12:46 PM  Result Value Ref Range   WBC 11.3 (H) 4.0 - 10.5 K/uL   RBC 3.87 3.87 - 5.11 MIL/uL   Hemoglobin 10.2 (L) 12.0 - 15.0 g/dL   HCT 33.6 (L) 36.0 - 46.0 %   MCV 86.8 80.0 - 100.0 fL   MCH 26.4 26.0 - 34.0 pg   MCHC 30.4 30.0 - 36.0 g/dL   RDW 15.9 (H) 11.5 - 15.5 %   Platelets 316 150 - 400 K/uL   nRBC 0.0 0.0 - 0.2 %    Comment: Performed at [facility], [place] [address], [place], [place] [address]  Hemoglobin A1c     Status: Abnormal   Collection Time: 02/26/22 12:46 PM  Result Value Ref Range   Hgb A1c MFr Bld 5.8 (H) 4.8 - 5.6 %    Comment: (NOTE) Pre diabetes:          5.7%-6.4%  Diabetes:              >6.4%  Glycemic control for    <7.0% adults with diabetes    Mean Plasma Glucose 119.76 mg/dL    Comment: Performed at [facility] [address] [place], [place] [address]  Vitamin B12     Status: None   Collection Time: 02/26/22 12:46 PM  Result Value Ref Range   Vitamin B-12 814 180 - 914 pg/mL    Comment: (NOTE) This assay is not validated for testing neonatal or myeloproliferative syndrome specimens for Vitamin B12 levels. Performed at [facility], [place] [address], [place], [place] [address]   Ammonia     Status: None   Collection Time: 02/26/22 12:46 PM  Result Value Ref Range   Ammonia 11 9 - 35 umol/L    Comment: Performed at [facility], [place] [address] [place], [place] [address]  Glucose, capillary     Status: Abnormal   Collection Time: 02/26/22 12:49 PM  Result Value Ref Range   Glucose-Capillary 132 (H) 70 - 99 mg/dL    Comment: Glucose reference range applies only to samples taken after fasting for at least 8 hours.  Glucose, capillary     Status: Abnormal   Collection Time: 02/26/22  4:46 PM  Result Value Ref Range   Glucose-Capillary 145 (H) 70 - 99 mg/dL    Comment: Glucose reference range applies only to samples taken after fasting for at least 8 hours.   *Note: Due to a large number of results and/or encounters for the requested time period, some results have not been displayed. A complete set of results can be found in Results Review.    Current Facility-Administered Medications  Medication Dose Route Frequency Provider Last Rate Last Admin   acetaminophen (TYLENOL) tablet 1,000 mg  1,000 mg Oral Q6H PRN Hosie Poisson, MD   1,000 mg at 02/26/22 1630   allopurinol (ZYLOPRIM) tablet 100 mg  100 mg Oral Daily Hosie Poisson, MD   100 mg at 02/26/22 0948   amLODipine (NORVASC) tablet 10 mg  10 mg Oral Daily Hosie Poisson, MD   10 mg at 02/26/22 0948   apixaban (ELIQUIS) tablet 5 mg  5 mg Oral BID Hosie Poisson, MD   5 mg at 02/26/22 0948    divalproex (DEPAKOTE ER) 24 hr tablet 250 mg  250 mg Oral QHS Hosie Poisson, MD       furosemide (LASIX) injection 40 mg  40 mg Intravenous Q12H Hosie Poisson, MD   40 mg at 02/26/22 0709   haloperidol lactate (HALDOL) injection 2 mg  2 mg Intravenous Q6H PRN Hosie Poisson, MD   2 mg at 02/26/22 0948   hydrALAZINE (APRESOLINE) tablet 25 mg  25 mg Oral Q8H PRN Hosie Poisson, MD       insulin aspart (novoLOG) injection 0-9 Units  0-9 Units Subcutaneous TID WC Hosie Poisson, MD   1 Units at 02/26/22 1704   ipratropium-albuterol (DUONEB) 0.5-2.5 (3) MG/3ML nebulizer solution 3 mL  3 mL Nebulization Q6H PRN Hosie Poisson, MD       [START ON 02/27/2022] multivitamin with minerals tablet 1 tablet  1 tablet Oral Daily Hosie Poisson, MD       OLANZapine (ZYPREXA) tablet 7.5 mg  7.5 mg Oral QHS Hosie Poisson, MD       ondansetron (ZOFRAN) tablet 4 mg  4 mg Oral Q6H PRN Hosie Poisson, MD       Or   ondansetron (ZOFRAN) injection 4 mg  4 mg Intravenous Q6H PRN Hosie Poisson, MD       potassium chloride SA (KLOR-CON M) CR tablet 40 mEq  40 mEq Oral BID Hosie Poisson, MD   40 mEq at 02/26/22 1630   pravastatin (PRAVACHOL) tablet 40 mg  40 mg Oral QPM Hosie Poisson, MD   40 mg at 02/25/22 2215   senna (SENOKOT) tablet 8.6 mg  1 tablet Oral Daily PRN Hosie Poisson, MD   8.6 mg at 02/26/22 0043   sodium bicarbonate tablet 1,300 mg  1,300 mg Oral BID Hosie Poisson, MD   1,300 mg at 02/26/22 0948   umeclidinium-vilanterol (ANORO ELLIPTA) 62.5-25 MCG/ACT 1 puff  1 puff Inhalation Daily Hosie Poisson, MD  1 puff at 02/26/22 0718    Musculoskeletal: Strength & Muscle Tone: decreased Gait & Station:  Unable to assess patient lying in bed Patient leans: N/A            Psychiatric Specialty Exam:  Presentation  General Appearance:  Disheveled  Eye Contact: Fair  Speech: Clear and Coherent; Normal Rate  Speech Volume: Normal  Handedness: Right   Mood and Affect   Mood: Irritable  Affect: Blunt   Thought Process  Thought Processes: Coherent; Linear  Descriptions of Associations:Intact  Orientation:Full (Time, Place and Person)  Thought Content:Logical  History of Schizophrenia/Schizoaffective disorder:No data recorded Duration of Psychotic Symptoms:No data recorded Hallucinations:Hallucinations: -- (family endorses, patient denies)  Ideas of Reference:Delusions; Paranoia; Percusatory  Suicidal Thoughts:Suicidal Thoughts: No  Homicidal Thoughts:Homicidal Thoughts: No   Sensorium  Memory: Immediate Fair; Recent Fair; Remote Fair  Judgment: Fair  Insight: Fair   Materials engineer: Fair  Attention Span: Fair  Recall: [employer] of Knowledge: Fair  Language: Fair   Psychomotor Activity  Psychomotor Activity: Psychomotor Activity: Normal   Assets  Assets: Communication Skills; Financial Resources/Insurance; Resilience; Social Support   Sleep  Sleep: Sleep: Poor   Physical Exam: Physical Exam Vitals and nursing note reviewed.  Constitutional:      Appearance: She is well-developed and normal weight.  HENT:     Head: Normocephalic.  Skin:    Capillary Refill: Capillary refill takes less than 2 seconds.  Neurological:     General: No focal deficit present.     Mental Status: She is alert and oriented to person, place, and time.  Psychiatric:        Mood and Affect: Mood normal.        Behavior: Behavior normal.    Review of Systems  Psychiatric/Behavioral: Negative.    All other systems reviewed and are negative.  Blood pressure (!) 174/52, pulse 68, temperature 98.2 F (36.8 C), temperature source Oral, resp. rate 18, height 5' 6"$  (1.676 m), weight 128.7 kg, SpO2 96 %. Body mass index is 45.8 kg/m.  Treatment Plan Summary: Daily contact with patient to assess and evaluate symptoms and progress in treatment, Medication management, and Plan \Will hold antipsychotics at  this time due to prolonged QTc of 533.  Although patient does appear to be responsive to current regimen as evidenced by reduction in behaviors, reduction in delusions and paranoia, improvement in current clinical presentation and reports from family.  Family reports modest improvement since starting psychotropic medication, and would like to continue current medications at this time.  Did discuss with both son and daughter-in-law(registered nurse), current risk of multiple antipsychotics, and multiple complex co morbidities, in addition to heart conditions.  She currently has a prolonged QTc of 533, will need cardiac clearance prior to continuing antipsychotics.  Her current condition is likely stable however will see approval from cardiac team prior to resuming antipsychotics at this time.  Will increase Depakote 250 mg p.o. twice daily to further target agitation and psychosis.  Will obtain valproic acid level, not obtained on admission highly doubt it is therapeutic as patient is on a very low dose of 250 mg p.o. daily.  -Patient is encouraged to consider MRI, to further assess for dementia and worsening cognitive impairment.  However she continues to decline at this time.  There appears to be no evidence of acute psychosis and or condition that would impair her ability to make appropriate clinical decisions at this time.  Her son did  reference seeking legal guardianship at this time, and prefers to keep his mother at home.  We did briefly discuss worsening cognitive impairment in the setting of schizophrenia, and potential for decline as demented is a progressive condition.  Patient's son is able to verbalize understanding however states he would like to keep mom at home if there is anything he can do in his power.  -Consider TOC consult for skilled nursing facility for rehabilitation. -Will also need outpatient neurology consult.  If patient's symptoms continue to persist during hospitalization, may  benefit from mocha/slums once medically stable.  Disposition: No evidence of imminent risk to self or others at present.   Patient does not meet criteria for psychiatric inpatient admission. Supportive therapy provided about ongoing stressors. Discussed crisis plan, support from social network, calling 911, coming to the Emergency Department, and calling Suicide Hotline.  Suella Broad, FNP 02/26/2022 5:44 PM

## 2022-02-26 NOTE — Progress Notes (Signed)
  Echocardiogram 2D Echocardiogram has been performed.  Abigail Wallace M    Echocardiogram not complete due to personal care. Will re-attempt as the schedule permits.   [name] 02/26/2022, 2:19 PM

## 2022-02-26 NOTE — Evaluation (Signed)
Clinical/Bedside Swallow Evaluation Patient Details  Name: Abigail Wallace MRN: [id number] Date of Birth: 1945-08-04  Today's Date: 02/26/2022 Time: SLP Start Time (ACUTE ONLY): 1000 SLP Stop Time (ACUTE ONLY): [id number] SLP Time Calculation (min) (ACUTE ONLY): 15 min  Past Medical History:  Past Medical History:  Diagnosis Date   Abdominal wall hernia 01/2017   Advanced care planning/counseling discussion 09/29/2021   Atrial fibrillation (HCC)    CHF (congestive heart failure) ([place])    CKD (chronic kidney disease)    COPD (chronic obstructive pulmonary disease) ([place])    Coronary artery disease    NON-CRITICAL   Ectasis aorta (HCC)    GERD (gastroesophageal reflux disease)    Gout    Hypertension    Memory difficulties 12/02/2013   Pacemaker    Psychosis ([place])    Tardive dyskinesia 12/02/2013   Type 2 diabetes mellitus ([place])    Past Surgical History:  Past Surgical History:  Procedure Laterality Date   CARDIAC CATHETERIZATION  2007   Non-critical.    CARDIOVERSION N/A 01/27/2021   Procedure: CARDIOVERSION;  Surgeon: Geralynn Rile, MD;  Location: [facility];  Service: Cardiovascular;  Laterality: N/A;   CHOLECYSTECTOMY     ELBOW SURGERY     EP IMPLANTABLE DEVICE N/A 08/31/2014   Procedure: PPM Generator Changeout;  Surgeon: Sanda Klein, MD;  Location: [facility];  Service: Cardiovascular;  Laterality: N/A;   INSERT / REPLACE / REMOVE PACEMAKER  2007   /SYMPTOMATIC HEART BLOCK   PACEMAKER PLACEMENT  09/28/2005   2/2 SSS?   Persantine stress test  05/01/2010   EF 66%. Normal LV sys fx. Unchanged from previous studies.    TRANSTHORACIC ECHOCARDIOGRAM  09/08/10   SEVERE CONCENTRIC HYPERTROPHY.LV FUNCTION WAS VIGOROUS.EF 65%-70%.VENTRICULAR SEPTUM-INCOORDINATE MOTION.LEFT ATRIUM-MILDLY DILATED.TRIVIAL TR.   TUBAL LIGATION     HPI:  Patient is a 77 y.o. female with PMH: CHF, HTN, COPD stage 4, GERD, a-fib, psychosis, DM-2, gout. She presented to the  hospital on 02/25/22 by her son, with increased confusion, SOB, slurred speech. Per son's report in ED, patient has been having intermittent hallcucinations and refusing breathing treatments at home.  CT head ordered to evaluate for CVA but patient refused. CXR showed mild CHF.    Assessment / Plan / Recommendation  Clinical Impression  Patient is not currently exhibiting clinical s/s of dysphagia as per this bedside swallow evaluation, however it was limited to one sip of thin liquids (water) as patient refusing majority of PO's offered. She exhibited timely swallow initiation and no overt s/s aspiration or penetration. Breakfast tray in front of her but patient only ate orange wedge. Patient has h/o GERD however no documented h/o oral or pharyngeal dysphagia. SLP not recommending further skilled intervention and she would likely refuse any objective swallow assessments even if they were offered. SLP to s/o at this time. SLP Visit Diagnosis: Dysphagia, unspecified (R13.10)    Aspiration Risk  No limitations    Diet Recommendation Regular;Dysphagia 3 (Mech soft);Thin liquid   Liquid Administration via: Cup;Straw Medication Administration: Whole meds with liquid Supervision: Patient able to self feed Postural Changes: Seated upright at 90 degrees    Other  Recommendations Oral Care Recommendations: Oral care BID    Recommendations for follow up therapy are one component of a multi-disciplinary discharge planning process, led by the attending physician.  Recommendations may be updated based on patient status, additional functional criteria and insurance authorization.  Follow up Recommendations No SLP follow up  Assistance Recommended at Discharge    Functional Status Assessment Patient has not had a recent decline in their functional status  Frequency and Duration     N/A       Prognosis   N/A     Swallow Study   General Date of Onset: 02/25/22 HPI: Patient is a 77 y.o. female  with PMH: CHF, HTN, COPD stage 4, GERD, a-fib, psychosis, DM-2, gout. She presented to the hospital on 02/25/22 by her son, with increased confusion, SOB, slurred speech. Per son's report in ED, patient has been having intermittent hallcucinations and refusing breathing treatments at home.  CT head ordered to evaluate for CVA but patient refused. CXR showed mild CHF. Type of Study: Bedside Swallow Evaluation Previous Swallow Assessment: none found Diet Prior to this Study: Regular;Thin liquids (Level 0) Temperature Spikes Noted: No Respiratory Status: Room air History of Recent Intubation: No Behavior/Cognition: Alert;Confused;Requires cueing Oral Cavity Assessment: Within Functional Limits Oral Care Completed by SLP: No Oral Cavity - Dentition: Edentulous Vision: Functional for self-feeding Self-Feeding Abilities: Able to feed self Patient Positioning: Upright in bed Baseline Vocal Quality: Normal Volitional Cough: Cognitively unable to elicit Volitional Swallow: Unable to elicit    Oral/Motor/Sensory Function Overall Oral Motor/Sensory Function: Other (comment) (patient did not allow for evaluation)   Ice Chips     Thin Liquid Thin Liquid: Within functional limits Presentation: Straw;Self Fed    Nectar Thick     Honey Thick     Puree Puree: Not tested   Solid     Solid: Not tested     Sonia Baller, MA, CCC-SLP Speech Therapy

## 2022-02-26 NOTE — Progress Notes (Signed)
Made on call provider aware that patient has refused at least twice to let phlebotomy draw labs.

## 2022-02-26 NOTE — Progress Notes (Signed)
Initial Nutrition Assessment  DOCUMENTATION CODES:   Morbid obesity  INTERVENTION:   -Magic cup BID with meals, each supplement provides 290 kcal and 9 grams of protein   -Multivitamin with minerals daily  NUTRITION DIAGNOSIS:   Inadequate oral intake related to lethargy/confusion as evidenced by meal completion < 25%.  GOAL:   Patient will meet greater than or equal to 90% of their needs  MONITOR:   PO intake, Supplement acceptance, Labs, Weight trends, I & O's  REASON FOR ASSESSMENT:   Consult Assessment of nutrition requirement/status  ASSESSMENT:   77 y.o. female with medical history significant of CHF, hypertension. Stage 4 CKD, COPD, GERD, atrial fibrillation on eliquis, psychosis,  SSS, s/p PPM, type 2 DM, gout.was brought in by her son for increased confusion, sob and slurred speech since one week.  Patient in room, no family at bedside. Pt poor historian. Pt states she is not eating well as food doesn't taste right. Was unable to elaborate. Pt states she doesn't like Ensure/Boost drinks, unwilling to drink clear liquid version. Pt likes ice cream so will order Magic cups on lunch/dinner trays. Will also add daily MVI. Pt has been refusing care, food and medications.  Did not eat any of her breakfast this morning.  Per weight records, weight has been trending up.   Medications: Lasix, KLOR-CON  Labs reviewed: CBGs: 115-132 Low K   NUTRITION - FOCUSED PHYSICAL EXAM:  Flowsheet Row Most Recent Value  Orbital Region No depletion  Upper Arm Region Mild depletion  Thoracic and Lumbar Region No depletion  Buccal Region No depletion  Temple Region No depletion  Clavicle Bone Region No depletion  Clavicle and Acromion Bone Region No depletion  Scapular Bone Region No depletion  Dorsal Hand No depletion  Patellar Region No depletion  Anterior Thigh Region No depletion  Posterior Calf Region No depletion  Edema (RD Assessment) None  Hair Reviewed  [thinning]   Eyes Reviewed  Mouth Reviewed  [missing teeth]  Skin Reviewed       Diet Order:   Diet Order             Diet Carb Modified Fluid consistency: Thin; Room service appropriate? Yes  Diet effective now                   EDUCATION NEEDS:   Not appropriate for education at this time  Skin:  Skin Assessment: Skin Integrity Issues: Skin Integrity Issues:: Stage II Stage II: sacrum  Last BM:  2/12  Height:   Ht Readings from Last 1 Encounters:  02/25/22 5' 6"$  (1.676 m)    Weight:   Wt Readings from Last 1 Encounters:  02/26/22 128.7 kg    BMI:  Body mass index is 45.8 kg/m.  Estimated Nutritional Needs:   Kcal:  1500-1700  Protein:  70-85g  Fluid:  1.7L/day   Abigail Bibles, MS, RD, LDN Inpatient Clinical Dietitian Contact information available via Amion

## 2022-02-26 NOTE — TOC Initial Note (Addendum)
Transition of Care [facility]) - Initial/Assessment Note    Patient Details  Name: Abigail Wallace MRN: [id number] Date of Birth: 03-21-1945  [facility] of [facility] CM/SW Contact:    Dessa Phi, RN Phone Number: 02/26/2022, 11:42 AM  Clinical Narrative:Patient Ax1. Left vm w/Ronadl(Son)-to discuss d/c plans,noted referral for Guardianship. Await PT recc.  -12p-spoke to son Ronald-d/c plans [facility] SNF-he would like to pursue guardianship-has papers-I have left vm w/APS-CSW Anderson#[phone] w/Ronald call back# to asst w/process;also Jori Moll has started CAPS services process for case worker on [place] list-awaiting outcome;current.y has [facility] for PCS 3hrs dya M-F-he is requesting additional hours. Will ait PT recc. Jori Moll also mentioned psych hx, his concerns-informed that attending will manage medical care if psych eval needed or to follow-Ronald voiced understanding.Monitior for d/c plans.                 Expected Discharge Plan:  (TBD) Barriers to Discharge: Continued Medical Work up   Patient Goals and CMS Choice   CMS Medicare.gov Compare Post Acute Care list provided to:: Patient Represenative (must comment) (Ronald(Son)) Choice offered to / list presented to : Adult [place] ownership interest in [facility].provided to:: Adult Children    Expected Discharge Plan and Services   Discharge Planning Services: CM Consult   Living arrangements for the past 2 months: Single Family Home                                      Prior Living Arrangements/Services Living arrangements for the past 2 months: Single Family Home Lives with:: Adult Children              Current home services: DME (w.c/active caregivers-[facility])    Activities of Daily Living Home Assistive Devices/Equipment: Other (Comment), [facility] bed (gait belt) ADL Screening (condition at time of admission) Patient's cognitive ability adequate to safely complete daily  activities?: No Is the patient deaf or have difficulty hearing?: No Does the patient have difficulty seeing, even when wearing glasses/contacts?: Yes (has cataracts) Does the patient have difficulty concentrating, remembering, or making decisions?: Yes Patient able to express need for assistance with ADLs?: Yes Does the patient have difficulty dressing or bathing?: Yes Independently performs ADLs?: No Communication: Independent Dressing (OT): Needs assistance Is this a change from baseline?: Pre-admission baseline Grooming: Needs assistance Is this a change from baseline?: Pre-admission baseline Feeding: Independent, Needs assistance Is this a change from baseline?: Pre-admission baseline Bathing: Needs assistance Is this a change from baseline?: Pre-admission baseline Toileting: Dependent Is this a change from baseline?: Pre-admission baseline In/Out Bed: Dependent Is this a change from baseline?: Pre-admission baseline Walks in Home: Dependent Is this a change from baseline?: Pre-admission baseline Does the patient have difficulty walking or climbing stairs?: Yes Weakness of Legs: Both Weakness of Arms/Hands: Both  Permission Sought/Granted                  Emotional Assessment              Admission diagnosis:  Slurred speech [R47.81] Pulmonary edema [J81.1] Acute on chronic congestive heart failure, unspecified heart failure type ([place]) [I50.9] Patient Active Problem List   Diagnosis Date Noted   Pulmonary edema 02/25/2022   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   Mild cognitive impairment 02/13/2022   Restlessness and agitation 02/13/2022   Low bicarbonate 01/12/2022   Manic behavior ([place]) 12/24/2021  Cholesteatoma 12/24/2021   Cataract 09/29/2021   Advanced care planning/counseling discussion 09/29/2021   Chronic back pain 11/14/2018   Pulmonary nodules 03/03/2018   Paroxysmal atrial fibrillation ([place]) s/p cardioversion  01/28/2018   Dependence on  wheelchair 05/17/2017   Abdominal aortic ectasia ([place]) 01/16/2017   Renal cyst 01/16/2017   Long-term use of high-risk medication 10/11/2016   CKD stage IIIb 09/12/2016   Essential hypertension 04/22/2013   Well controlled type 2 diabetes mellitus ([place]) 03/20/2012   Gout 09/26/2011   Dysuria 06/21/2011   Pacemaker 03/30/2010   Vaginal prolapse 03/27/2010   Sleep apnea 03/27/2010   Osteoarthritis of right knee 03/27/2010   Resting tremor 12/15/2009   Chronic combined systolic and diastolic heart failure ([place]) 09/30/2009   Sick sinus syndrome ([place]) 04/24/2006   Mixed hyperlipidemia 03/14/2006   Paranoid schizophrenia ([place]) 03/14/2006   COPD 03/14/2006   PCP:  Martyn Malay, MD Pharmacy:   [facility] [address] [place] [address] Phone: [phone] Fax: [phone]     Social Determinants of Health (SDOH) Social History: SDOH Screenings   Food Insecurity: No Food Insecurity (02/25/2022)  Housing: Low Risk  (02/25/2022)  Transportation Needs: No Transportation Needs (02/25/2022)  Utilities: Not At Risk (02/25/2022)  Alcohol Screen: Low Risk  (12/27/2020)  Depression (PHQ2-9): High Risk (02/22/2022)  Financial Resource Strain: Low Risk  (12/27/2020)  Physical Activity: Inactive (12/27/2020)  Social Connections: Socially Isolated (12/27/2020)  Stress: No Stress Concern Present (12/27/2020)  Tobacco Use: High Risk (02/26/2022)   SDOH Interventions:     Readmission Risk Interventions     No data to display

## 2022-02-26 NOTE — Progress Notes (Signed)
Spoke with  patients son Jori Moll to get clarity if patient is allergic to haldol; pt is confused at this time; son states that he isn't sure if patient is allergic, but he doesn't think she is due to her history of saying she's allergic to things she is not; son gives permission to try haldol; doctor notified

## 2022-02-26 NOTE — Progress Notes (Signed)
PT refused CPAP.  

## 2022-02-26 NOTE — Progress Notes (Addendum)
Since patient is incontinent and non-ambulatory at this point, RN educated patient on the use of the purewick so staff can keep a measurement of the urinary output. However, patient kept refusing the [place]. Patient also kept refusing flat sheet, dark brown heavy blanket, and hospital gown and saying that she is allergic to the material. Patient is also having hallucinations and seeing things that are not there. Patient also thinks that the nurse is trying to poison her when giving meds. Patient refused both her nighttime Depakote and Zyprexa.

## 2022-02-26 NOTE — Evaluation (Signed)
Occupational Therapy Evaluation Patient Details Name: Abigail Wallace MRN: [id number] DOB: 02/23/1945 Today's Date: 02/26/2022   History of Present Illness Patient is a 77 year old female who presented with slurred speech, shortness of breath and confusion. Patient was admitted with acute on chronic systolic and diastolic heart failure, acute metabolic encephalopathy, and elevated troponin. PMH: schizophrenia, CHF, HTN, CKD IV, COPD, A fib, DM II, infrarenal AAA.   Clinical Impression   Patient is a 77 year old female who was admitted for above. Patient was living at home with son support in apartment at wheelchair level. Patient's son reported that patient was able to transfer from bed to wheelchair and wheelchair to commode using bed rail/grab bar. Currently, patient is +2 for rolling in bed with increased difficulty following commands and anxiousness about all tasks. Patient was unable to attempt sitting EOB at this time. Patients son does not have +2 supports available at home to care for patient at current level. Patient would continue to benefit from skilled OT services at this time while admitted and after d/c to address noted deficits in order to improve overall safety and independence in ADLs.       Recommendations for follow up therapy are one component of a multi-disciplinary discharge planning process, led by the attending physician.  Recommendations may be updated based on patient status, additional functional criteria and insurance authorization.   Follow Up Recommendations  Skilled nursing-short term rehab (<3 hours/day)     Assistance Recommended at Discharge Frequent or constant Supervision/Assistance  Patient can return home with the following Two people to help with walking and/or transfers;Assistance with cooking/housework;Direct supervision/assist for medications management;Assist for transportation;Help with stairs or ramp for entrance;Direct supervision/assist for financial  management;Two people to help with bathing/dressing/bathroom    Functional Status Assessment  Patient has had a recent decline in their functional status and demonstrates the ability to make significant improvements in function in a reasonable and predictable amount of time.  Equipment Recommendations  Other (comment) (defer to next venue)    Recommendations for Other Services       Precautions / Restrictions Precautions Precautions: Fall Restrictions Weight Bearing Restrictions: No      Mobility Bed Mobility Overal bed mobility: Needs Assistance Bed Mobility: Rolling Rolling: +2 for safety/equipment, +2 for physical assistance, Total assist         General bed mobility comments: patient had difficult time following directions.            ADL either performed or assessed with clinical judgement   ADL Overall ADL's : Needs assistance/impaired   Eating/Feeding Details (indicate cue type and reason): nothing in room to assess Grooming: Total assistance;Bed level   Upper Body Bathing: Bed level;Total assistance   Lower Body Bathing: Bed level;Total assistance   Upper Body Dressing : Bed level;Total assistance Upper Body Dressing Details (indicate cue type and reason): patient declined to wear hospital gown reporting she was allergic to the material. son was called to obtain PLOF with son reporting that patient does not have such allergy. son reported he would be present around lunch time and bring gowns from home. patient was provided with multiple blankets to remain covered during session. Lower Body Dressing: Bed level;Total assistance     Toilet Transfer Details (indicate cue type and reason): patient is +2 for movement in bed to change soiled linens. patient declined attempts to sit EOB on this date. patient reported " i dont want to go to the highway". Toileting- Water quality scientist  and Hygiene: Bed level;Total assistance Toileting - Clothing Manipulation  Details (indicate cue type and reason): to change linens with increased difficulty with patient not following commands to assist with rolling tasks or maintaining sidelying.       General ADL Comments: unable to progress to EOB due to safety with patient size and level of cooperation in tasks at bed level     Vision   Additional Comments: difficult to assess with patient's current cognitive deficits and paranoia like responses            Pertinent Vitals/Pain Pain Assessment Pain Assessment: Faces Faces Pain Scale: No hurt     Hand Dominance Right   Extremity/Trunk Assessment Upper Extremity Assessment Upper Extremity Assessment: Difficult to assess due to impaired cognition (patient declined to participate in PROM and AAROM with patient having difficult time following directions during session.)   Lower Extremity Assessment Lower Extremity Assessment: Defer to PT evaluation   Cervical / Trunk Assessment Cervical / Trunk Assessment:  (bariatric)   Communication Communication Communication: No difficulties   Cognition Arousal/Alertness: Awake/alert Behavior During Therapy: Anxious, Flat affect Overall Cognitive Status: Difficult to assess       General Comments: patient declined to participate in all questioning.                Home Living Family/patient expects to be discharged to:: Private residence Living Arrangements: Children Available Help at Discharge: Family;Available PRN/intermittently Type of Home: Apartment (senior living apartments) Home Access: Level entry     Home Layout: One level     Bathroom Shower/Tub: Teacher, early years/pre: Standard     Home Equipment: Shower seat;BSC/3in1;Tub bench   Additional Comments: wont use the tub bench that she has at home.      Prior Functioning/Environment Prior Level of Function : Needs assist             Mobility Comments: has not walked in 5 years. PLOF was porvided by son Via  phone call. patient reported " you dont need to know that" when asked about home set up. ADLs Comments: was getting out of bed on her own to w/c holding bed rail and getting to toilet. patients son helps with getting in and out of tub every other day.        OT Problem List: Decreased activity tolerance;Impaired balance (sitting and/or standing);Decreased coordination;Decreased safety awareness;Decreased knowledge of precautions;Obesity;Impaired UE functional use;Decreased knowledge of use of DME or AE      OT Treatment/Interventions: Self-care/ADL training;Energy conservation;Therapeutic exercise;DME and/or AE instruction;Therapeutic activities;Patient/family education;Balance training    OT Goals(Current goals can be found in the care plan section) Acute Rehab OT Goals Patient Stated Goal: to get up in bed OT Goal Formulation: With family Time For Goal Achievement: 03/12/22 Potential to Achieve Goals: Fair  OT Frequency: Min 2X/week       AM-PAC OT "6 Clicks" Daily Activity     Outcome Measure Help from another person eating meals?: A Lot Help from another person taking care of personal grooming?: A Lot Help from another person toileting, which includes using toliet, bedpan, or urinal?: Total Help from another person bathing (including washing, rinsing, drying)?: Total Help from another person to put on and taking off regular upper body clothing?: Total Help from another person to put on and taking off regular lower body clothing?: Total 6 Click Score: 8   End of Session Nurse Communication: Other (comment) (ok to participate in session)  Activity Tolerance: Other (comment);Patient  limited by fatigue (difficulty following directions) Patient left: in bed;with call bell/phone within reach;with bed alarm set;with nursing/sitter in room (NT)  OT Visit Diagnosis: Unsteadiness on feet (R26.81);Other abnormalities of gait and mobility (R26.89);Other symptoms and signs involving  cognitive function                Time: [id number] OT Time Calculation (min): 18 min Charges:  OT General Charges $OT Visit: 1 Visit OT Evaluation $OT Eval Moderate Complexity: 1 Mod  Kalid Ghan OTR/L, MS Acute Rehabilitation Department Office# [phone]   Willa Rough 02/26/2022, 9:08 AM

## 2022-02-26 NOTE — Progress Notes (Signed)
RT requested to PT room for Breathing treatment. PT told RT he was not allowed in her room. RT informed RN and gave RN medication for PT.

## 2022-02-26 NOTE — Plan of Care (Signed)

## 2022-02-26 NOTE — Progress Notes (Signed)
[facility]                                                                               [name], is a 77 y.o. female, DOB - 1945/06/13, [id number] Admit date - 02/25/2022    Outpatient Primary MD for the patient is Abigail Malay, MD  LOS - 1  days    Brief summary     Abigail Wallace is a 77 y.o. female with medical history significant of CHF, hypertension. Stage 4 CKD, COPD, GERD, atrial fibrillation on eliquis, psychosis,  SSS, s/p PPM, type 2 DM, gout.was brought in by her son for increased confusion, sob and slurred speech since one week . CXR showed mild CHF. CT head without contrast was ordered for evaluation of stroke but patient is refused the test.   Assessment & Plan    Assessment and Plan:  Acute on chronic systolic and diastolic heart failure  Admit to progressive bed.  CXR showed mild CHF.  Started her on IV lasix 40 mg IV BID.  Continue with strict intake and output, daily weights.  Last echocardiogram in 2022 showed LVEF of 40 to 45%. Left diastolic function could not be evaluated.  Repeat echocardiogram ordered PENDING.  Elevated BNP 690 .      Slurred speech at home  the last 3 days: Currently speech is back to baseline . She denies any weakness or sensory deficits.  CT head without contrast ordered by EDP,. Patient refused  to undergo CT or MRI Brain.  Son at bedside. Understands that she is refusing the test.  Patient is already on Eliquis.  Slp eval ordered recommending dysphagia 3 diet.    Acute metabolic encephalopathy: from CHF? In the setting of schizophrenia, and medication non compliance.  She is refusing CT head.  Ammonia is wnl.  TSH wnl.  Patient on depakote and zyprexa, continue the same.        Elevated troponins ,  Mildly elevated , flat.  EKG is paced.  She denies any chest pain.  Suspect from demand ischemia from CHF.    Gout:  On allopurinol.    COPD; No wheezing heard on exam.  Resume duonebs  prn if she is agreeable.      Paroxysmal Atrial fibrillation :  S/p cardioversion in January 2023.  Rate controlled. On eliquis for anti coagulation.      Hypertension:  Not well controlled.  Restarted home meds     Anemia of chronic disease:  Monitor.      Leukocytosis:  Improving.  Unclear etiology.  Check UA.      Body mass index is 46.12 kg/m. Morbid obesity:    H/o schizophrenia and psychosis:  Resume home meds. Psychiatry consulted for medications. Looks like she is refusing medications and care.     SS Dysfunction and  CHB s/p PPM Outpatient follow up .      Obstructive sleep apnea not on CPAP at home.    Infrarenal AAA:  Outpatient follow up with vascular surgery/ cardiology.    Type 2 DM, diet controlled.  Continue with SSI.      Hyperlipidemia; Continue with statin.  Hypokalemia  Replaced.    RN Pressure Injury Documentation: Pressure Injury 02/25/22 Sacrum Mid;Posterior Stage 2 -  Partial thickness loss of dermis presenting as a shallow open injury with a red, pink wound bed without slough. (Active)  02/25/22 2000  Location: Sacrum  Location Orientation: Mid;Posterior  Staging: Stage 2 -  Partial thickness loss of dermis presenting as a shallow open injury with a red, pink wound bed without slough.  Wound Description (Comments):   Present on Admission: Yes  Dressing Type Foam - Lift dressing to assess site every shift 02/25/22 2000   Foam dressing in place.      Estimated body mass index is 45.8 kg/m as calculated from the following:   Height as of this encounter: 5' 6"$  (1.676 m).   Weight as of this encounter: 128.7 kg.  Code Status: full code. DVT Prophylaxis:   apixaban (ELIQUIS) tablet 5 mg   Level of Care: Level of care: Progressive Family Communication: Updated patient's son over the phone.   Disposition Plan:     Remains inpatient appropriate:  IV lasix.  Procedures:  None.  Consultants:    None.  Antimicrobials:   Anti-infectives (From admission, onward)    None        Medications  Scheduled Meds:  allopurinol  100 mg Oral Daily   amLODipine  10 mg Oral Daily   apixaban  5 mg Oral BID   divalproex  250 mg Oral QHS   furosemide  40 mg Intravenous Q12H   insulin aspart  0-9 Units Subcutaneous TID WC   OLANZapine  7.5 mg Oral QHS   pravastatin  40 mg Oral QPM   sodium bicarbonate  1,300 mg Oral BID   umeclidinium-vilanterol  1 puff Inhalation Daily   Continuous Infusions: PRN Meds:.acetaminophen, haloperidol lactate, hydrALAZINE, ipratropium-albuterol, ondansetron **OR** ondansetron (ZOFRAN) IV, senna    Subjective:   [facility] was seen and examined today. Still confused.   Objective:   Vitals:   02/26/22 0621 02/26/22 0718 02/26/22 1004 02/26/22 1255  BP: (!) 154/49  (!) 171/83 (!) 174/52  Pulse: (!) 58  70 68  Resp: 20  20 18  $ Temp: 97.7 F (36.5 C)  97.8 F (36.6 C) 98.2 F (36.8 C)  TempSrc: Oral  Oral Oral  SpO2: 94% 95%  96%  Weight:      Height:        Intake/Output Summary (Last 24 hours) at 02/26/2022 1346 Last data filed at 02/26/2022 0626 Gross per 24 hour  Intake 720 ml  Output --  Net 720 ml   Filed Weights   02/25/22 1357 02/25/22 2000 02/26/22 0500  Weight: 127 kg 129.6 kg 128.7 kg     Exam General exam: Appears calm and comfortable  Respiratory system: Clear to auscultation. Respiratory effort normal. Cardiovascular system: S1 & S2 heard, RRR. No JVD, pedal edema . Gastrointestinal system: Abdomen is nondistended, soft and nontender.  Central nervous system: Alert and oriented to place , person, time. But slightly confused.  Extremities: Symmetric 5 x 5 power. Skin: No rashes,  Psychiatry: Mood & affect appropriate.     Data Reviewed:  I have personally reviewed following labs and imaging studies   CBC Lab Results  Component Value Date   WBC 11.3 (H) 02/26/2022   RBC 3.87 02/26/2022   HGB 10.2 (L)  02/26/2022   HCT 33.6 (L) 02/26/2022   MCV 86.8 02/26/2022   MCH 26.4 02/26/2022   PLT 316 02/26/2022   MCHC  30.4 02/26/2022   RDW 15.9 (H) 02/26/2022   LYMPHSABS 2.1 02/25/2022   MONOABS 0.9 02/25/2022   EOSABS 0.2 02/25/2022   BASOSABS 0.1 [id number]     Last metabolic panel Lab Results  Component Value Date   NA 138 02/26/2022   K 3.2 (L) 02/26/2022   CL 106 02/26/2022   CO2 24 02/26/2022   BUN 30 (H) 02/26/2022   CREATININE 1.49 (H) 02/26/2022   GLUCOSE 138 (H) 02/26/2022   GFRNONAA 36 (L) 02/26/2022   GFRAA 41 (L) 01/01/2020   CALCIUM 9.3 02/26/2022   PHOS 4.2 09/05/2020   PROT 6.1 (L) 02/25/2022   ALBUMIN 3.1 (L) 02/25/2022   LABGLOB 2.1 01/11/2022   AGRATIO 2.0 01/11/2022   BILITOT 0.5 02/25/2022   ALKPHOS 46 02/25/2022   AST 22 02/25/2022   ALT 15 02/25/2022   ANIONGAP 8 02/26/2022    CBG (last 3)  Recent Labs    02/25/22 2139 02/26/22 0752 02/26/22 1249  GLUCAP 120* 115* 132*      Coagulation Profile: No results for input(s): "INR", "PROTIME" in the last 168 hours.   Radiology Studies: DG Chest Port 1 View  Result Date: 02/25/2022 CLINICAL DATA:  Shortness of breath EXAM: PORTABLE CHEST 1 VIEW COMPARISON:  12/29/2021 FINDINGS: Left-sided implanted cardiac device. Cardiomegaly. Aortic atherosclerosis. Pulmonary vascular congestion. Mild interstitial prominence. No pleural effusion or pneumothorax. IMPRESSION: Findings suggestive of CHF with mild edema. Electronically Signed   By: Davina Poke D.O.   On: 02/25/2022 15:06       Hosie Poisson M.D. [facility] 02/26/2022, 1:46 PM  Available via Epic secure chat 7am-7pm After 7 pm, please refer to night coverage provider listed on amion.

## 2022-02-27 ENCOUNTER — Encounter: Payer: Self-pay | Admitting: Cardiovascular Disease

## 2022-02-27 DIAGNOSIS — I10 Essential (primary) hypertension: Secondary | ICD-10-CM | POA: Diagnosis not present

## 2022-02-27 DIAGNOSIS — I509 Heart failure, unspecified: Secondary | ICD-10-CM

## 2022-02-27 DIAGNOSIS — Z515 Encounter for palliative care: Secondary | ICD-10-CM | POA: Diagnosis not present

## 2022-02-27 DIAGNOSIS — F201 Disorganized schizophrenia: Secondary | ICD-10-CM

## 2022-02-27 DIAGNOSIS — J810 Acute pulmonary edema: Secondary | ICD-10-CM | POA: Diagnosis not present

## 2022-02-27 DIAGNOSIS — R4781 Slurred speech: Secondary | ICD-10-CM | POA: Diagnosis not present

## 2022-02-27 DIAGNOSIS — F209 Schizophrenia, unspecified: Secondary | ICD-10-CM | POA: Diagnosis not present

## 2022-02-27 DIAGNOSIS — Z7189 Other specified counseling: Secondary | ICD-10-CM

## 2022-02-27 LAB — BASIC METABOLIC PANEL
Anion gap: 8 (ref 5–15)
BUN: 30 mg/dL — ABNORMAL HIGH (ref 8–23)
CO2: 21 mmol/L — ABNORMAL LOW (ref 22–32)
Calcium: 8.9 mg/dL (ref 8.9–10.3)
Chloride: 108 mmol/L (ref 98–111)
Creatinine, Ser: 1.47 mg/dL — ABNORMAL HIGH (ref 0.44–1.00)
GFR, Estimated: 36 mL/min — ABNORMAL LOW (ref >60)
Glucose, Bld: 134 mg/dL — ABNORMAL HIGH (ref 70–99)
Potassium: 3.9 mmol/L (ref 3.5–5.1)
Sodium: 137 mmol/L (ref 135–145)

## 2022-02-27 LAB — CBC
HCT: 30.7 % — ABNORMAL LOW (ref 36.0–46.0)
Hemoglobin: 9.5 g/dL — ABNORMAL LOW (ref 12.0–15.0)
MCH: 26.5 pg (ref 26.0–34.0)
MCHC: 30.9 g/dL (ref 30.0–36.0)
MCV: 85.8 fL (ref 80.0–100.0)
Platelets: 291 10*3/uL (ref 150–400)
RBC: 3.58 MIL/uL — ABNORMAL LOW (ref 3.87–5.11)
RDW: 15.9 % — ABNORMAL HIGH (ref 11.5–15.5)
WBC: 12.4 10*3/uL — ABNORMAL HIGH (ref 4.0–10.5)
nRBC: 0 % (ref 0.0–0.2)

## 2022-02-27 LAB — GLUCOSE, CAPILLARY
Glucose-Capillary: 131 mg/dL — ABNORMAL HIGH (ref 70–99)
Glucose-Capillary: 115 mg/dL — ABNORMAL HIGH (ref 70–99)
Glucose-Capillary: 150 mg/dL — ABNORMAL HIGH (ref 70–99)
Glucose-Capillary: 126 mg/dL — ABNORMAL HIGH (ref 70–99)

## 2022-02-27 LAB — MAGNESIUM: Magnesium: 2 mg/dL (ref 1.7–2.4)

## 2022-02-27 LAB — VALPROIC ACID LEVEL: Valproic Acid Lvl: 14 ug/mL — ABNORMAL LOW (ref 50.0–100.0)

## 2022-02-27 MED ORDER — HALOPERIDOL 2 MG PO TABS
2.0000 mg | ORAL_TABLET | Freq: Two times a day (BID) | ORAL | Status: DC
  Administered 2022-03-01 – 2022-03-04 (×5): 2 mg via ORAL
  Filled 2022-02-27 (×12): qty 1

## 2022-02-27 MED ORDER — HALOPERIDOL LACTATE 5 MG/ML IJ SOLN
2.0000 mg | Freq: Two times a day (BID) | INTRAMUSCULAR | Status: DC
  Administered 2022-02-28 – 2022-03-05 (×5): 2 mg via INTRAVENOUS
  Filled 2022-02-27 (×7): qty 1

## 2022-02-27 MED ORDER — DIVALPROEX SODIUM 250 MG PO DR TAB
250.0000 mg | DELAYED_RELEASE_TABLET | Freq: Two times a day (BID) | ORAL | Status: DC
  Administered 2022-02-27 – 2022-03-01 (×5): 250 mg via ORAL
  Filled 2022-02-27 (×6): qty 1

## 2022-02-27 NOTE — Progress Notes (Signed)
Placed patient on CPAP via nasal mask. Immediately, she demanded it be removed and states "I can't breathe". She is not willing to try again at this time. Equipment remains at bedside. RT will continue to follow and encourage use.

## 2022-02-27 NOTE — Consult Note (Signed)
[facility] Consult   Reason for Consult: History of schizophrenia, psychosis, refusing care and medications.  Referring Physician: Dr. Karleen Hampshire Patient Identification: Abigail Wallace MRN:  [id number] Principal Diagnosis: Pulmonary edema Diagnosis:  Principal Problem:   Pulmonary edema Active Problems:   Slurred speech   Disorganized schizophrenia ([place])   Total Time spent with patient: 1 hour  Subjective:   Abigail Wallace is a 77 y.o. female patient admitted with slurred speech, shortness of breath, and confusion.  Patient does have past medical history of acute on chronic systolic and diastolic heart failure, acute metabolic encephalopathy, elevated troponin, hypertension, CKD stage IV, COPD, intrarenal AAA atrial fibrillation, and diabetes.  Chart review shows patient has history of schizophrenia, symptoms consistent with mild cognitive impairment that has been progressively getting worse over the past 15 years.    On evaluation patient is alert and oriented, calm and cooperative, very pleasant upon approach.  Patient does present with some obvious mild cognitive impairments, as she continues to be very repetitive, forgetful (unaware that he stated facts previously ), forgetting important events.  She is noted to be looking for her cell phone, when she appears to be frightened and in panic screaming for this provider to help her.  Patient states "I was bit by a snake this morning.  It is in my bed.  I do not know where it is now but it is in here somewhere.  Please help me, phone so I can call for help. " She reports poor sleep and poor appetite.  Unclear if her poor appetite is secondary to paranoia believing staff are trying to poison her, or lack of appetite.  At the present time she denies any suicidal ideations, homicidal ideations, and or nonsuicidal self-injurious behavior.  Patient denies any auditory and/or visual hallucinations, does appear to be responding to external  stimuli.  While patient is displaying some response to external stimuli, her thought process remains lucid and patient appears to answer all questions appropriately.  There appear to be multiple barriers to discharging home. Psychiatry will continue to follow at this time.       She continues to refuse any additional imaging for further evaluation on her brain to rule out for stroke, organic cause for hallucinations, and dementia.  Patient was previously responding well to Haldol.  Due to significance of current congestive heart failure this nurse practitioner did reach out to Dr. Jaquita Rector for cardiac clearance and review.  Briefly discussed goals to treat with Haldol, he agreed to review EKG after repeat after starting Haldol.  He does acknowledges patient had very long QT interval due to ventricular pacing with a very broad QRS, however after correction it is appropriate.  Attempted to contact son today x 2, however unsuccessful phone rings 1 time and goes to voicemail.  HPI:  Abigail Wallace is a 77 y.o. female with medical history significant of CHF, hypertension. Stage 4 CKD, COPD, GERD, atrial fibrillation on eliquis, psychosis,  SSS, s/p PPM, type 2 DM, gout.was brought in by her son for increased confusion, sob and slurred speech since one week . CXR showed mild CHF. CT head without contrast was ordered for evaluation of stroke but patient is refused the test. She continues to refuse medical care intermittently, refusing lab work, MRI of the brain to check for stroke and dementia.  She is currently getting IV lasix for her CHF.    Past Psychiatric History: Patient denies, however family at bedside endorses history of  schizophrenia.  Son states patient is under the care of [name].  She is currently being managed with Depakote 250 mg p.o. nightly.  She notably was taken olanzapine 7.5 mg p.o. nightly, however last filled history unknown does not appear to be compliant with such.  Risk to Self:   Denies Risk to Others:  Denies Prior Inpatient Therapy:  Denies Prior Outpatient Therapy:  [facility] Quaker per son  Past Medical History:  Past Medical History:  Diagnosis Date   Abdominal wall hernia 01/2017   Advanced care planning/counseling discussion 09/29/2021   Atrial fibrillation (HCC)    CHF (congestive heart failure) ([place])    CKD (chronic kidney disease)    COPD (chronic obstructive pulmonary disease) ([place])    Coronary artery disease    NON-CRITICAL   Ectasis aorta (HCC)    GERD (gastroesophageal reflux disease)    Gout    Hypertension    Memory difficulties 12/02/2013   Pacemaker    Psychosis ([place])    Tardive dyskinesia 12/02/2013   Type 2 diabetes mellitus ([place])     Past Surgical History:  Procedure Laterality Date   CARDIAC CATHETERIZATION  2007   Non-critical.    CARDIOVERSION N/A 01/27/2021   Procedure: CARDIOVERSION;  Surgeon: Geralynn Rile, MD;  Location: [facility];  Service: Cardiovascular;  Laterality: N/A;   CHOLECYSTECTOMY     ELBOW SURGERY     EP IMPLANTABLE DEVICE N/A 08/31/2014   Procedure: PPM Generator Changeout;  Surgeon: Sanda Klein, MD;  Location: [facility];  Service: Cardiovascular;  Laterality: N/A;   INSERT / REPLACE / REMOVE PACEMAKER  2007   [address]/SYMPTOMATIC HEART BLOCK   PACEMAKER PLACEMENT  09/28/2005   2/2 SSS?   Persantine stress test  05/01/2010   EF 66%. Normal LV sys fx. Unchanged from previous studies.    TRANSTHORACIC ECHOCARDIOGRAM  09/08/10   SEVERE CONCENTRIC HYPERTROPHY.LV FUNCTION WAS VIGOROUS.EF 65%-70%.VENTRICULAR SEPTUM-INCOORDINATE MOTION.LEFT ATRIUM-MILDLY DILATED.TRIVIAL TR.   TUBAL LIGATION     Family History:  Family History  Problem Relation Age of Onset   Heart disease Mother    Heart disease Father    Stroke Sister    Diabetes type II Son    Family Psychiatric  History: Unable to assess Social History:  Social History   Substance and Sexual Activity  Alcohol Use No    Alcohol/week: 0.0 standard drinks of alcohol     Social History   Substance and Sexual Activity  Drug Use No    Social History   Socioeconomic History   Marital status: Divorced    Spouse name: Not on file   Number of children: 3   Years of education: 14   Highest education level: Associate degree: academic program  Occupational History   Not on file  Tobacco Use   Smoking status: Every Day    Types: Cigarettes    Last attempt to quit: 03/15/2021    Years since quitting: 0.9   Smokeless tobacco: Never   Tobacco comments:    1cig a day  Vaping Use   Vaping Use: Former  Substance and Sexual Activity   Alcohol use: No    Alcohol/week: 0.0 standard drinks of alcohol   Drug use: No   Sexual activity: Not Currently    Partners: Male    Birth control/protection: Post-menopausal    Comment: BTL  Other Topics Concern   Not on file  Social History Narrative   Patient lives with her son in [place] in a  one story apartment.    Son is patients caregiver ([name].)    Patient is wheelchair bound, except for bedtime.    Patient enjoys listing to music and visiting family.    Social Determinants of Health   Financial Resource Strain: Low Risk  (12/27/2020)   Overall Financial Resource Strain (CARDIA)    Difficulty of Paying Living Expenses: Not hard at all  Food Insecurity: No Food Insecurity (02/25/2022)   Hunger Vital Sign    Worried About Running Out of Food in the Last Year: Never true    Ran Out of Food in the Last Year: Never true  Transportation Needs: No Transportation Needs (02/25/2022)   PRAPARE - Hydrologist (Medical): No    Lack of Transportation (Non-Medical): No  Physical Activity: Inactive (12/27/2020)   Exercise Vital Sign    Days of Exercise per Week: 0 days    Minutes of Exercise per Session: 0 min  Stress: No Stress Concern Present (12/27/2020)   [place]     Feeling of Stress : Only a little  Social Connections: Socially Isolated (12/27/2020)   Social Connection and Isolation Panel [NHANES]    Frequency of Communication with Friends and Family: More than three times a week    Frequency of Social Gatherings with Friends and Family: More than three times a week    Attends Religious Services: Never    Marine scientist or Organizations: No    Attends Archivist Meetings: Never    Marital Status: Divorced   Additional Social History:    Allergies:   Allergies  Allergen Reactions   Aripiprazole Anaphylaxis, Other (See Comments) and Shortness Of Breath    Tremors   Clozapine Anaphylaxis   Benadryl [Diphenhydramine Hcl] Other (See Comments)    States affects her vision   Benztropine Other (See Comments)    Affects mobility   Latuda [Lurasidone Hcl] Other (See Comments)    "Tremors and shakes."    Lorazepam Other (See Comments)    Unknown reaction??   Remeron [Mirtazapine] Other (See Comments)    Tremors and shakes   Haloperidol Lactate Other (See Comments)    Unknown reaction??   Keflex [Cephalexin] Swelling and Other (See Comments)    Per patient, has tongue swelling with keflex but can tolerate amoxicillin   Codeine Other (See Comments)    Unknown reaction??   Latex Rash    Labs:  Results for orders placed or performed during the hospital encounter of 02/25/22 (from the past 48 hour(s))  Glucose, capillary     Status: Abnormal   Collection Time: 02/25/22  9:39 PM  Result Value Ref Range   Glucose-Capillary 120 (H) 70 - 99 mg/dL    Comment: Glucose reference range applies only to samples taken after fasting for at least 8 hours.  Glucose, capillary     Status: Abnormal   Collection Time: 02/26/22  7:52 AM  Result Value Ref Range   Glucose-Capillary 115 (H) 70 - 99 mg/dL    Comment: Glucose reference range applies only to samples taken after fasting for at least 8 hours.  Troponin I (High Sensitivity)     Status:  Abnormal   Collection Time: 02/26/22 12:46 PM  Result Value Ref Range   Troponin I (High Sensitivity) 22 (H) <18 ng/L    Comment: (NOTE) Elevated high sensitivity troponin I (hsTnI) values and significant  changes across serial measurements may suggest ACS  but many other  chronic and acute conditions are known to elevate hsTnI results.  Refer to the "Links" section for chest pain algorithms and additional  guidance. Performed at [facility], [place] [address] [place], [place] [id number]   Basic metabolic panel     Status: Abnormal   Collection Time: 02/26/22 12:46 PM  Result Value Ref Range   Sodium 138 135 - 145 mmol/L   Potassium 3.2 (L) 3.5 - 5.1 mmol/L   Chloride 106 98 - 111 mmol/L   CO2 24 22 - 32 mmol/L   Glucose, Bld 138 (H) 70 - 99 mg/dL    Comment: Glucose reference range applies only to samples taken after fasting for at least 8 hours.   BUN 30 (H) 8 - 23 mg/dL   Creatinine, Ser 1.49 (H) 0.44 - 1.00 mg/dL   Calcium 9.3 8.9 - 10.3 mg/dL   GFR, Estimated 36 (L) >60 mL/min    Comment: (NOTE) Calculated using the CKD-EPI Creatinine Equation (2021)    Anion gap 8 5 - 15    Comment: Performed at [facility], [place] [address] [place], [place] [address]  CBC     Status: Abnormal   Collection Time: 02/26/22 12:46 PM  Result Value Ref Range   WBC 11.3 (H) 4.0 - 10.5 K/uL   RBC 3.87 3.87 - 5.11 MIL/uL   Hemoglobin 10.2 (L) 12.0 - 15.0 g/dL   HCT 33.6 (L) 36.0 - 46.0 %   MCV 86.8 80.0 - 100.0 fL   MCH 26.4 26.0 - 34.0 pg   MCHC 30.4 30.0 - 36.0 g/dL   RDW 15.9 (H) 11.5 - 15.5 %   Platelets 316 150 - 400 K/uL   nRBC 0.0 0.0 - 0.2 %    Comment: Performed at [facility], [place] [address], [place], [place] [address]  Hemoglobin A1c     Status: Abnormal   Collection Time: 02/26/22 12:46 PM  Result Value Ref Range   Hgb A1c MFr Bld 5.8 (H) 4.8 - 5.6 %    Comment: (NOTE) Pre diabetes:          5.7%-6.4%  Diabetes:               >6.4%  Glycemic control for   <7.0% adults with diabetes    Mean Plasma Glucose 119.76 mg/dL    Comment: Performed at [place] [address] [place], [place] [address]  Vitamin B12     Status: None   Collection Time: 02/26/22 12:46 PM  Result Value Ref Range   Vitamin B-12 814 180 - 914 pg/mL    Comment: (NOTE) This assay is not validated for testing neonatal or myeloproliferative syndrome specimens for Vitamin B12 levels. Performed at [facility], [place] [address] [place], [place] [address]   Ammonia     Status: None   Collection Time: 02/26/22 12:46 PM  Result Value Ref Range   Ammonia 11 9 - 35 umol/L    Comment: Performed at [facility], [place] [address] [place],  [address]  Glucose, capillary     Status: Abnormal   Collection Time: 02/26/22 12:49 PM  Result Value Ref Range   Glucose-Capillary 132 (H) 70 - 99 mg/dL    Comment: Glucose reference range applies only to samples taken after fasting for at least 8 hours.  Glucose, capillary     Status: Abnormal   Collection Time: 02/26/22  4:46 PM  Result  Value Ref Range   Glucose-Capillary 145 (H) 70 - 99 mg/dL    Comment: Glucose reference range applies only to samples taken after fasting for at least 8 hours.  Glucose, capillary     Status: Abnormal   Collection Time: 02/26/22  8:34 PM  Result Value Ref Range   Glucose-Capillary 132 (H) 70 - 99 mg/dL    Comment: Glucose reference range applies only to samples taken after fasting for at least 8 hours.  Basic metabolic panel     Status: Abnormal   Collection Time: 02/27/22  4:51 AM  Result Value Ref Range   Sodium 137 135 - 145 mmol/L   Potassium 3.9 3.5 - 5.1 mmol/L   Chloride 108 98 - 111 mmol/L   CO2 21 (L) 22 - 32 mmol/L   Glucose, Bld 134 (H) 70 - 99 mg/dL    Comment: Glucose reference range applies only to samples taken after fasting for at least 8 hours.   BUN 30 (H) 8 - 23 mg/dL   Creatinine, Ser  1.47 (H) 0.44 - 1.00 mg/dL   Calcium 8.9 8.9 - 10.3 mg/dL   GFR, Estimated 36 (L) >60 mL/min    Comment: (NOTE) Calculated using the CKD-EPI Creatinine Equation (2021)    Anion gap 8 5 - 15    Comment: Performed at [facility], [place] [address] [place], [place] [address]  CBC     Status: Abnormal   Collection Time: 02/27/22  4:51 AM  Result Value Ref Range   WBC 12.4 (H) 4.0 - 10.5 K/uL   RBC 3.58 (L) 3.87 - 5.11 MIL/uL   Hemoglobin 9.5 (L) 12.0 - 15.0 g/dL   HCT 30.7 (L) 36.0 - 46.0 %   MCV 85.8 80.0 - 100.0 fL   MCH 26.5 26.0 - 34.0 pg   MCHC 30.9 30.0 - 36.0 g/dL   RDW 15.9 (H) 11.5 - 15.5 %   Platelets 291 150 - 400 K/uL   nRBC 0.0 0.0 - 0.2 %    Comment: Performed at [facility], [place] [address] [place], [place] [address]  Valproic acid level     Status: Abnormal   Collection Time: 02/27/22  5:29 AM  Result Value Ref Range   Valproic Acid Lvl 14 (L) 50.0 - 100.0 ug/mL    Comment: Performed at [facility], [place] [address], [place], [place] [address]  Glucose, capillary     Status: Abnormal   Collection Time: 02/27/22  7:35 AM  Result Value Ref Range   Glucose-Capillary 131 (H) 70 - 99 mg/dL    Comment: Glucose reference range applies only to samples taken after fasting for at least 8 hours.  Glucose, capillary     Status: Abnormal   Collection Time: 02/27/22 12:05 PM  Result Value Ref Range   Glucose-Capillary 115 (H) 70 - 99 mg/dL    Comment: Glucose reference range applies only to samples taken after fasting for at least 8 hours.   *Note: Due to a large number of results and/or encounters for the requested time period, some results have not been displayed. A complete set of results can be found in Results Review.    Current Facility-Administered Medications  Medication Dose Route Frequency Provider Last Rate Last Admin   acetaminophen (TYLENOL) tablet 1,000 mg  1,000 mg Oral Q6H PRN Hosie Poisson, MD   1,000 mg  at 02/27/22 0650   allopurinol (ZYLOPRIM) tablet 100 mg  100 mg Oral Daily Ak[place],  Jeoffrey Massed, MD   100 mg at 02/27/22 0952   amLODipine (NORVASC) tablet 10 mg  10 mg Oral Daily Hosie Poisson, MD   10 mg at 02/27/22 0953   apixaban (ELIQUIS) tablet 5 mg  5 mg Oral BID Hosie Poisson, MD   5 mg at 02/27/22 0951   divalproex (DEPAKOTE) DR tablet 250 mg  250 mg Oral Q12H Hosie Poisson, MD   250 mg at 02/27/22 0951   furosemide (LASIX) injection 40 mg  40 mg Intravenous Q12H Hosie Poisson, MD   40 mg at 02/27/22 0551   hydrALAZINE (APRESOLINE) tablet 25 mg  25 mg Oral Q8H PRN Hosie Poisson, MD   25 mg at 02/26/22 2057   insulin aspart (novoLOG) injection 0-9 Units  0-9 Units Subcutaneous TID WC Hosie Poisson, MD   1 Units at 02/27/22 0800   ipratropium-albuterol (DUONEB) 0.5-2.5 (3) MG/3ML nebulizer solution 3 mL  3 mL Nebulization Q6H PRN Hosie Poisson, MD   3 mL at 02/26/22 2256   multivitamin with minerals tablet 1 tablet  1 tablet Oral Daily Hosie Poisson, MD   1 tablet at 02/27/22 0953   ondansetron (ZOFRAN) tablet 4 mg  4 mg Oral Q6H PRN Hosie Poisson, MD       Or   ondansetron (ZOFRAN) injection 4 mg  4 mg Intravenous Q6H PRN Hosie Poisson, MD   4 mg at 02/27/22 0954   pravastatin (PRAVACHOL) tablet 40 mg  40 mg Oral QPM Hosie Poisson, MD   40 mg at 02/26/22 1756   senna (SENOKOT) tablet 8.6 mg  1 tablet Oral Daily PRN Hosie Poisson, MD   8.6 mg at 02/26/22 0043   sodium bicarbonate tablet 1,300 mg  1,300 mg Oral BID Hosie Poisson, MD   1,300 mg at 02/27/22 0954   umeclidinium-vilanterol (ANORO ELLIPTA) 62.5-25 MCG/ACT 1 puff  1 puff Inhalation Daily Hosie Poisson, MD   1 puff at 02/27/22 0743    Musculoskeletal: Strength & Muscle Tone: decreased Gait & Station:  Unable to assess patient lying in bed Patient leans: N/A            Psychiatric Specialty Exam:  Presentation  General Appearance:  Disheveled  Eye Contact: Fair  Speech: Clear and Coherent; Normal Rate  Speech  Volume: Increased  Handedness: Right   Mood and Affect  Mood: Irritable  Affect: Blunt   Thought Process  Thought Processes: Coherent; Linear  Descriptions of Associations:Intact  Orientation:Full (Time, Place and Person)  Thought Content:Scattered; Delusions; Perseveration; Logical  History of Schizophrenia/Schizoaffective disorder:Yes  Duration of Psychotic Symptoms:Greater than six months  Hallucinations:Hallucinations: Visual Description of Visual Hallucinations: snakes in her bed and biting her  Ideas of Reference:Delusions; Paranoia; Percusatory  Suicidal Thoughts:Suicidal Thoughts: No  Homicidal Thoughts:Homicidal Thoughts: No   Sensorium  Memory: Immediate Fair; Recent Fair; Remote Fair  Judgment: Fair  Insight: Shallow   Executive Functions  Concentration: Fair  Attention Span: Fair  Recall: [place] of Knowledge: Good  Language: Fair   Psychomotor Activity  Psychomotor Activity: Psychomotor Activity: Normal   Assets  Assets: Financial Resources/Insurance; Communication Skills; Housing; Social Support   Sleep  Sleep: Sleep: Fair   Physical Exam: Physical Exam Vitals and nursing note reviewed.  Constitutional:      Appearance: She is well-developed and normal weight.  HENT:     Head: Normocephalic.  Skin:    Capillary Refill: Capillary refill takes less than 2 seconds.  Neurological:     General: No focal deficit present.  Mental Status: She is alert and oriented to person, place, and time.  Psychiatric:        Mood and Affect: Mood normal.        Behavior: Behavior normal.    Review of Systems  Psychiatric/Behavioral: Negative.    All other systems reviewed and are negative.  Blood pressure (!) 159/131, pulse 70, temperature 98.1 F (36.7 C), temperature source Oral, resp. rate 15, height 5' 6"$  (1.676 m), weight 128.5 kg, SpO2 90 %. Body mass index is 45.71 kg/m.  Treatment Plan Summary: Daily  contact with patient to assess and evaluate symptoms and progress in treatment, Medication management, and Plan \Will hold antipsychotics at this time due to prolonged QTc of 533.  Although patient does appear to be responsive to current regimen as evidenced by reduction in behaviors, reduction in delusions and paranoia, improvement in current clinical presentation and reports from family.  Family reports modest improvement since starting psychotropic medication, and would like to continue current medications at this time.  Will resume Haldol 2 mg p.o./IV twice daily for management of psychosis and new onset hallucinations.  -Will obtain baseline EKG, an EKG in 48 hours after starting Haldol scheduled.  There is a allergy/contraindication for Haldol, however patient has recently received Haldol during his hospital stay appeared to tolerate well.  There appears to be no evidence of anaphylaxis, shortness of breath, hives/urticaria, itching, muscle rigidity, dizziness and or worsening nausea and vomiting.  -Will continue Depakote 250 mg p.o. twice daily to further target agitation and psychosis.  Will place valproic acid level for Friday, February 16.  -Continue ongoing support for appropriate decision making by patient and son to include MRI to further assess for dementia, stroke, and worsening cognitive impairment.  There appears to be no evidence of acute psychosis and or condition that would impair her ability to make appropriate clinical decisions at this time.  Her son did reference seeking legal guardianship at this time, and prefers to keep his mother at home.   -Consider [facility] consult for skilled nursing facility for rehabilitation. -Will also need outpatient neurology consult.  If patient's symptoms continue to persist during hospitalization, may benefit from mocha/slums once medically stable.  Disposition: No evidence of imminent risk to self or others at present.   Patient does not meet  criteria for psychiatric inpatient admission. Supportive therapy provided about ongoing stressors. Discussed crisis plan, support from social network, calling 911, coming to the Emergency Department, and calling Suicide Hotline.  Suella Broad, FNP 02/27/2022 2:41 PM

## 2022-02-27 NOTE — Progress Notes (Signed)
[facility]                                                                               [name], is a 77 y.o. female, DOB - 1946/01/15, [id number] Admit date - 02/25/2022    Outpatient Primary MD for the patient is Martyn Malay, MD  LOS - 2  days    Brief summary     Abigail Wallace is a 77 y.o. female with medical history significant of CHF, hypertension. Stage 4 CKD, COPD, GERD, atrial fibrillation on eliquis, psychosis,  SSS, s/p PPM, type 2 DM, gout.was brought in by her son for increased confusion, sob and slurred speech since one week . CXR showed mild CHF. CT head without contrast was ordered for evaluation of stroke but patient is refused the test. She continues to refuse medical care intermittently, refusing lab work, MRI of the brain to check for stroke and dementia.  She is currently getting IV lasix for her CHF.    Assessment & Plan    Assessment and Plan:  Acute on chronic systolic and diastolic heart failure  Admit to progressive bed.  CXR showed mild CHF.  Started her on IV lasix 40 mg IV BID.  Continue with strict intake and output, daily weights.  Last echocardiogram in 2022 showed LVEF of 40 to 45%.  Repeat Echocardiogram shows improvement in the LVEF to 55%. Diastolic parameters couldn't be evaluated.  Urine output could not be measured.  Elevated BNP 690 .      Slurred speech / confusion.  Currently speech is back to baseline . She denies any  focal weakness or sensory deficits.  CT head without contrast ordered by EDP,. Patient refused  to undergo CT or MRI Brain.  Patient is already on Eliquis.  Slp eval ordered recommending dysphagia 3 diet.    Acute metabolic encephalopathy: from CHF? In the setting of schizophrenia, and medication non compliance.  She is refusing CT head or MRI brain for further evaluation. .  Ammonia is wnl.  TSH wnl.  Patient on depakote and zyprexa, continue the same.        Elevated troponins ,   Mildly elevated , flat.  EKG is paced.  She denies any chest pain.  Suspect from demand ischemia from CHF.    Gout:  On allopurinol.    COPD; No wheezing heard on exam.  Resume duonebs prn if she is agreeable.      Paroxysmal Atrial fibrillation :  S/p cardioversion in January 2023.  Rate controlled. On eliquis for anti coagulation.      Hypertension:  Not well controlled.  Restarted home meds     Anemia of chronic disease:  Hemoglobin around 9.5.      Leukocytosis:  Improving.  Unclear etiology.  Check UA.      Body mass index is 46.12 kg/m. Morbid obesity:    H/o schizophrenia  Psychiatry consulted for medications. Subtherapeutic valproic acid levels.      SS Dysfunction and  CHB s/p PPM Outpatient follow up with cardiology..      Obstructive sleep apnea not on CPAP at home.    Infrarenal AAA:  Outpatient follow up with vascular surgery/ cardiology.    Type 2 DM, diet controlled.  Continue with SSI.  CBG (last 3)  Recent Labs    02/26/22 2034 02/27/22 0735 02/27/22 1205  GLUCAP 132* 131* 115*        Hyperlipidemia; Continue with statin.     Prolonged QTc  KEEP MAG >2 AND K >4.  Check EKG in am.      Hypokalemia  Replaced.    RN Pressure Injury Documentation: Pressure Injury 02/25/22 Sacrum Mid;Posterior Stage 2 -  Partial thickness loss of dermis presenting as a shallow open injury with a red, pink wound bed without slough. (Active)  02/25/22 2000  Location: Sacrum  Location Orientation: Mid;Posterior  Staging: Stage 2 -  Partial thickness loss of dermis presenting as a shallow open injury with a red, pink wound bed without slough.  Wound Description (Comments):   Present on Admission: Yes  Dressing Type Foam - Lift dressing to assess site every shift 02/27/22 0743   Foam dressing in place.   Interventions: MVI, Magic cup  Estimated body mass index is 45.71 kg/m as calculated from the following:   Height as of this  encounter: 5' 6"$  (1.676 m).   Weight as of this encounter: 128.5 kg.  Code Status: full code. DVT Prophylaxis:   apixaban (ELIQUIS) tablet 5 mg   Level of Care: Level of care: Progressive Family Communication: Updated patient's son over the phone.   Disposition Plan:     Remains inpatient appropriate:  IV lasix.  Procedures:  Echocardiogram.   Consultants:   Psychiatry.   Antimicrobials:   Anti-infectives (From admission, onward)    None        Medications  Scheduled Meds:  allopurinol  100 mg Oral Daily   amLODipine  10 mg Oral Daily   apixaban  5 mg Oral BID   divalproex  250 mg Oral Q12H   furosemide  40 mg Intravenous Q12H   insulin aspart  0-9 Units Subcutaneous TID WC   multivitamin with minerals  1 tablet Oral Daily   pravastatin  40 mg Oral QPM   sodium bicarbonate  1,300 mg Oral BID   umeclidinium-vilanterol  1 puff Inhalation Daily   Continuous Infusions: PRN Meds:.acetaminophen, hydrALAZINE, ipratropium-albuterol, ondansetron **OR** ondansetron (ZOFRAN) IV, senna    Subjective:   [facility] was seen and examined today.  Denies any sob , but has abdominal breathing. On RA. Wants to speak to her son.   Objective:   Vitals:   02/26/22 2250 02/26/22 2351 02/27/22 0420 02/27/22 0743  BP: (!) 198/59 (!) 168/38 (!) 165/35   Pulse:   (!) 59   Resp:   20   Temp:   97.9 F (36.6 C)   TempSrc:   Axillary   SpO2:   98% 94%  Weight:   128.5 kg   Height:        Intake/Output Summary (Last 24 hours) at 02/27/2022 1424 Last data filed at 02/27/2022 0710 Gross per 24 hour  Intake 480 ml  Output 1000 ml  Net -520 ml    Filed Weights   02/25/22 2000 02/26/22 0500 02/27/22 0420  Weight: 129.6 kg 128.7 kg 128.5 kg     Exam General exam: elderly woman, not in distress.  Respiratory system: diminished air entry at bases. . Cardiovascular system: S1 & S2 heard, RRR. No JVD, murmurs, pedal edema present.  Gastrointestinal system: Abdomen is  nondistended, soft and nontender.  Central nervous system:  Alert and oriented to person and place . Grossly non focal.  Extremities: pedal edema present. . Skin: No rashes,  Psychiatry: anxious.     Data Reviewed:  I have personally reviewed following labs and imaging studies   CBC Lab Results  Component Value Date   WBC 12.4 (H) 02/27/2022   RBC 3.58 (L) 02/27/2022   HGB 9.5 (L) 02/27/2022   HCT 30.7 (L) 02/27/2022   MCV 85.8 02/27/2022   MCH 26.5 02/27/2022   PLT 291 02/27/2022   MCHC 30.9 02/27/2022   RDW 15.9 (H) 02/27/2022   LYMPHSABS 2.1 02/25/2022   MONOABS 0.9 02/25/2022   EOSABS 0.2 02/25/2022   BASOSABS 0.1 [id number]     Last metabolic panel Lab Results  Component Value Date   NA 137 02/27/2022   K 3.9 02/27/2022   CL 108 02/27/2022   CO2 21 (L) 02/27/2022   BUN 30 (H) 02/27/2022   CREATININE 1.47 (H) 02/27/2022   GLUCOSE 134 (H) 02/27/2022   GFRNONAA 36 (L) 02/27/2022   GFRAA 41 (L) 01/01/2020   CALCIUM 8.9 02/27/2022   PHOS 4.2 09/05/2020   PROT 6.1 (L) 02/25/2022   ALBUMIN 3.1 (L) 02/25/2022   LABGLOB 2.1 01/11/2022   AGRATIO 2.0 01/11/2022   BILITOT 0.5 02/25/2022   ALKPHOS 46 02/25/2022   AST 22 02/25/2022   ALT 15 02/25/2022   ANIONGAP 8 02/27/2022    CBG (last 3)  Recent Labs    02/26/22 2034 02/27/22 0735 02/27/22 1205  GLUCAP 132* 131* 115*       Coagulation Profile: No results for input(s): "INR", "PROTIME" in the last 168 hours.   Radiology Studies: ECHOCARDIOGRAM COMPLETE  Result Date: 02/26/2022    ECHOCARDIOGRAM REPORT   Patient Name:   Timothy [name] Date of Exam: 02/26/2022 Medical Rec #:  [id number]       Height:       66.0 in Accession #:    [id number]      Weight:       283.7 lb Date of Birth:  Dec 26, 1945       BSA:          2.320 m Patient Age:    41 years        BP:           174/52 mmHg Patient Gender: F               HR:           70 bpm. Exam Location:  Inpatient Procedure: 2D Echo, Cardiac Doppler and Color  Doppler Indications:    CHF-Acute Diastolic [id number]  History:        Patient has prior history of Echocardiogram examinations, most                 recent 11/28/2020. CAD, Pacemaker, COPD, Arrythmias:Atrial                 Fibrillation; Risk Factors:Diabetes. Chronic kidney disease.  Sonographer:    Darlina Sicilian RDCS Referring Phys: Theola Sequin  Sonographer Comments: Imaging difficult due to altered mental status. IMPRESSIONS  1. Left ventricular ejection fraction, by estimation, is 55 to 60%. The left ventricle has normal function. The left ventricle has no regional wall motion abnormalities. There is severe asymmetric left ventricular hypertrophy of the basal-septal segment. Left ventricular diastolic function could not be evaluated.  2. Right ventricular systolic function is normal. The right ventricular size is normal.  3. Left atrial size was  mildly dilated.  4. Right atrial size was mildly dilated.  5. The mitral valve is grossly normal. No evidence of mitral valve regurgitation. No evidence of mitral stenosis.  6. The aortic valve is grossly normal. Aortic valve regurgitation is not visualized. No aortic stenosis is present. Comparison(s): Prior images reviewed side by side. Conclusion(s)/Recommendation(s): Difficult images, but LVEF appears grossly normal. This is an improvement from prior EF of 40-45%. FINDINGS  Left Ventricle: Left ventricular ejection fraction, by estimation, is 55 to 60%. The left ventricle has normal function. The left ventricle has no regional wall motion abnormalities. The left ventricular internal cavity size was normal in size. There is  severe asymmetric left ventricular hypertrophy of the basal-septal segment. Left ventricular diastolic function could not be evaluated due to paced rhythm. Left ventricular diastolic function could not be evaluated. Right Ventricle: The right ventricular size is normal. Right vetricular wall thickness was not well visualized. Right  ventricular systolic function is normal. Left Atrium: Left atrial size was mildly dilated. Right Atrium: Right atrial size was mildly dilated. Pericardium: Trivial pericardial effusion is present. The pericardial effusion is circumferential. Presence of epicardial fat layer. Mitral Valve: The mitral valve is grossly normal. No evidence of mitral valve regurgitation. No evidence of mitral valve stenosis. Tricuspid Valve: The tricuspid valve is grossly normal. Tricuspid valve regurgitation is not demonstrated. No evidence of tricuspid stenosis. Aortic Valve: The aortic valve is grossly normal. Aortic valve regurgitation is not visualized. No aortic stenosis is present. Pulmonic Valve: The pulmonic valve was not well visualized. Pulmonic valve regurgitation is not visualized. No evidence of pulmonic stenosis. Aorta: The aortic root and ascending aorta are structurally normal, with no evidence of dilitation. Venous: The inferior vena cava was not well visualized. IAS/Shunts: The atrial septum is grossly normal. Additional Comments: A device lead is visualized.  LEFT VENTRICLE PLAX 2D LVIDd:         5.20 cm LVIDs:         2.90 cm LV PW:         1.00 cm LV IVS:        1.70 cm LVOT diam:     2.20 cm LV SV:         77 LV SV Index:   33 LVOT Area:     3.80 cm  RIGHT VENTRICLE RV S prime:     17.40 cm/s TAPSE (M-mode): 2.0 cm LEFT ATRIUM           Index        RIGHT ATRIUM           Index LA diam:      3.90 cm 1.68 cm/m   RA Area:     21.40 cm LA Vol (A2C): 62.4 ml 26.89 ml/m  RA Volume:   64.50 ml  27.80 ml/m LA Vol (A4C): 79.4 ml 34.22 ml/m  AORTIC VALVE LVOT Vmax:   117.00 cm/s LVOT Vmean:  68.000 cm/s LVOT VTI:    0.203 m  AORTA Ao Root diam: 3.00 cm Ao Asc diam:  3.00 cm MITRAL VALVE MV Area (PHT): 3.31 cm     SHUNTS MV Decel Time: 229 msec     Systemic VTI:  0.20 m MV E velocity: 133.00 cm/s  Systemic Diam: 2.20 cm Buford Dresser MD Electronically signed by Buford Dresser MD Signature Date/Time:  02/26/2022/4:07:59 PM    Final    DG Chest Port 1 View  Result Date: 02/25/2022 CLINICAL DATA:  Shortness of breath EXAM: PORTABLE CHEST  1 VIEW COMPARISON:  12/29/2021 FINDINGS: Left-sided implanted cardiac device. Cardiomegaly. Aortic atherosclerosis. Pulmonary vascular congestion. Mild interstitial prominence. No pleural effusion or pneumothorax. IMPRESSION: Findings suggestive of CHF with mild edema. Electronically Signed   By: Davina Poke D.O.   On: 02/25/2022 15:06       Hosie Poisson M.D. [facility] 02/27/2022, 2:24 PM  Available via Epic secure chat 7am-7pm After 7 pm, please refer to night coverage provider listed on amion.

## 2022-02-27 NOTE — TOC CM/SW Note (Signed)
Transition of Care (TOC) -30 day Note       Patient Details   Name: Abigail Wallace  [id number]  Date of Birth:[birth year]     Transition of Care [facility]) CM/SW Contact   Name:Denaly Gatling [facility]  Phone Number:[phone]  Date:02/27/22  Time:4:35p     MUST [id number]     To Whom it May Concern:     Please be advised that the above patient will require a short-term nursing home stay, anticipated 30 days or less rehabilitation and strengthening. The plan is for return home.

## 2022-02-27 NOTE — Progress Notes (Signed)
Heart Failure Navigator Progress Note  Assessed for Heart & Vascular TOC clinic readiness.  Patient EF 55-60%. Per Psych note, patient with dementia and worsening cognitive function. .   Navigator will sign off at this time. Earnestine Leys, BSN, RN Heart Failure Transport planner Only

## 2022-02-27 NOTE — TOC Progression Note (Signed)
Transition of Care [facility]) - Progression Note    Patient Details  Name: Abigail Wallace MRN: [id number] Date of Birth: 06-May-1945  Transition of Care [facility]) CM/SW Contact  Maiko Salais, Juliann Pulse, RN Phone Number: 02/27/2022, 4:38 PM  Clinical Narrative:  [facility] pasrr , & bed offers.     Expected Discharge Plan: [place] Barriers to Discharge: Continued Medical Work up  Expected Discharge Plan and Services   Discharge Planning Services: CM Consult   Living arrangements for the past 2 months: Single Family Home                                       Social Determinants of Health (SDOH) Interventions SDOH Screenings   Food Insecurity: No Food Insecurity (02/25/2022)  Housing: Low Risk  (02/25/2022)  Transportation Needs: No Transportation Needs (02/25/2022)  Utilities: Not At Risk (02/25/2022)  Alcohol Screen: Low Risk  (12/27/2020)  Depression (PHQ2-9): High Risk (02/22/2022)  Financial Resource Strain: Low Risk  (12/27/2020)  Physical Activity: Inactive (12/27/2020)  Social Connections: Socially Isolated (12/27/2020)  Stress: No Stress Concern Present (12/27/2020)  Tobacco Use: High Risk (02/26/2022)    Readmission Risk Interventions     No data to display

## 2022-02-27 NOTE — NC FL2 (Signed)
[place] LEVEL OF CARE FORM     IDENTIFICATION  Patient Name: Abigail Wallace Birthdate: 08-Jan-1946 Sex: female Admission Date (Current Location): 02/25/2022  [facility] and [place] Number:  Herbalist and Address:  [facility],  [address], [place]      Provider Number: [id number]  Attending Physician Name and Address:  Hosie Poisson, MD  Relative Name and Phone Number:   Abigail Wallace(son) [phone])    Current Level of Care: Hospital Recommended Level of Care: [place] Prior Approval Number:    Date Approved/Denied:   PASRR Number:    Discharge Plan: SNF    Current Diagnoses: Patient Active Problem List   Diagnosis Date Noted   Counseling and coordination of care 02/27/2022   Goals of care, counseling/discussion 02/27/2022   Acute on chronic congestive heart failure ([place]) 02/27/2022   Palliative care encounter 02/27/2022   Slurred speech 02/26/2022   Disorganized schizophrenia ([place]) 02/26/2022   Pulmonary edema 02/25/2022   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   Mild cognitive impairment 02/13/2022   Restlessness and agitation 02/13/2022   Low bicarbonate 01/12/2022   Manic behavior ([place]) 12/24/2021   Cholesteatoma 12/24/2021   Cataract 09/29/2021   Advanced care planning/counseling discussion 09/29/2021   Chronic back pain 11/14/2018   Pulmonary nodules 03/03/2018   Paroxysmal atrial fibrillation ([place]) s/p cardioversion  01/28/2018   Dependence on wheelchair 05/17/2017   Abdominal aortic ectasia ([place]) 01/16/2017   Renal cyst 01/16/2017   Long-term use of high-risk medication 10/11/2016   CKD stage IIIb 09/12/2016   Essential hypertension 04/22/2013   Well controlled type 2 diabetes mellitus ([place]) 03/20/2012   Gout 09/26/2011   Dysuria 06/21/2011   Pacemaker 03/30/2010   Vaginal prolapse 03/27/2010   Sleep apnea 03/27/2010   Osteoarthritis of right knee 03/27/2010   Resting tremor  12/15/2009   Chronic combined systolic and diastolic heart failure ([place]) 09/30/2009   Sick sinus syndrome ([place]) 04/24/2006   Mixed hyperlipidemia 03/14/2006   Paranoid schizophrenia ([place]) 03/14/2006   COPD 03/14/2006    Orientation RESPIRATION BLADDER Height & Weight     Self, Situation, Place  Normal Continent Weight: 128.5 kg Height:  5' 6"$  (167.6 cm)  BEHAVIORAL SYMPTOMS/MOOD NEUROLOGICAL BOWEL NUTRITION STATUS      Continent Diet (CHO MOD)  AMBULATORY STATUS COMMUNICATION OF NEEDS Skin   Limited Assist Verbally PU Stage and Appropriate Care (sacrum stage 2)   PU Stage 2 Dressing: BID                   Personal Care Assistance Level of Assistance  Bathing, Feeding, Dressing Bathing Assistance: Limited assistance Feeding assistance: Limited assistance Dressing Assistance: Limited assistance     Functional Limitations Info  Sight, Hearing, Speech Sight Info: Adequate Hearing Info: Adequate Speech Info: Impaired (Dentures top/bottom)    SPECIAL CARE FACTORS FREQUENCY  PT (By licensed PT), OT (By licensed OT)     PT Frequency:  (5x week) OT Frequency:  (5x week)            Contractures Contractures Info: Not present    Additional Factors Info  Code Status, Allergies, Psychotropic Code Status Info:  (Full) Allergies Info:  (Aripiprazole, Clozapine, Benadryl (Diphenhydramine Hcl), Benztropine, Latuda (Lurasidone Hcl), Lorazepam, Remeron (Mirtazapine), Haloperidol Lactate, Keflex (Cephalexin), Codeine, Latex) Psychotropic Info:  (Depakote 262m q12hrs)         Current Medications (02/27/2022):  This is the current hospital active medication list Current Facility-Administered  Medications  Medication Dose Route Frequency Provider Last Rate Last Admin   acetaminophen (TYLENOL) tablet 1,000 mg  1,000 mg Oral Q6H PRN Hosie Poisson, MD   1,000 mg at 02/27/22 0650   allopurinol (ZYLOPRIM) tablet 100 mg  100 mg Oral Daily Hosie Poisson, MD   100 mg at 02/27/22 0952    amLODipine (NORVASC) tablet 10 mg  10 mg Oral Daily Hosie Poisson, MD   10 mg at 02/27/22 0953   apixaban (ELIQUIS) tablet 5 mg  5 mg Oral BID Hosie Poisson, MD   5 mg at 02/27/22 0951   divalproex (DEPAKOTE) DR tablet 250 mg  250 mg Oral Q12H Hosie Poisson, MD   250 mg at 02/27/22 0951   furosemide (LASIX) injection 40 mg  40 mg Intravenous Q12H Hosie Poisson, MD   40 mg at 02/27/22 0551   hydrALAZINE (APRESOLINE) tablet 25 mg  25 mg Oral Q8H PRN Hosie Poisson, MD   25 mg at 02/26/22 2057   insulin aspart (novoLOG) injection 0-9 Units  0-9 Units Subcutaneous TID WC Hosie Poisson, MD   1 Units at 02/27/22 0800   ipratropium-albuterol (DUONEB) 0.5-2.5 (3) MG/3ML nebulizer solution 3 mL  3 mL Nebulization Q6H PRN Hosie Poisson, MD   3 mL at 02/26/22 2256   multivitamin with minerals tablet 1 tablet  1 tablet Oral Daily Hosie Poisson, MD   1 tablet at 02/27/22 0953   ondansetron (ZOFRAN) tablet 4 mg  4 mg Oral Q6H PRN Hosie Poisson, MD       Or   ondansetron (ZOFRAN) injection 4 mg  4 mg Intravenous Q6H PRN Hosie Poisson, MD   4 mg at 02/27/22 0954   pravastatin (PRAVACHOL) tablet 40 mg  40 mg Oral QPM Hosie Poisson, MD   40 mg at 02/26/22 1756   senna (SENOKOT) tablet 8.6 mg  1 tablet Oral Daily PRN Hosie Poisson, MD   8.6 mg at 02/26/22 0043   sodium bicarbonate tablet 1,300 mg  1,300 mg Oral BID Hosie Poisson, MD   1,300 mg at 02/27/22 0954   umeclidinium-vilanterol (ANORO ELLIPTA) 62.5-25 MCG/ACT 1 puff  1 puff Inhalation Daily Hosie Poisson, MD   1 puff at 02/27/22 [id number]     Discharge Medications: Please see discharge summary for a list of discharge medications.  Relevant Imaging Results:  Relevant Lab Results:   Additional Information  [id number])  Abigail Wallace, Abigail Pulse, RN

## 2022-02-27 NOTE — Plan of Care (Signed)
  Problem: Clinical Measurements: Goal: Will remain free from infection Outcome: Progressing   Problem: Nutrition: Goal: Adequate nutrition will be maintained Outcome: Progressing   Problem: Elimination: Goal: Will not experience complications related to bowel motility Outcome: Progressing Goal: Will not experience complications related to urinary retention Outcome: Progressing

## 2022-02-27 NOTE — Consult Note (Signed)
Consultation Note Date: 02/27/2022   Patient Name: Abigail Wallace  DOB: June 22, 1945  MRN: [id number]  Age / Sex: 77 y.o., female   PCP: Martyn Malay, MD Referring Physician: Hosie Poisson, MD  Reason for Consultation: Establishing goals of care     Chief Complaint/History of Present Illness:   Patient is 77 year old female with a past medical history of CHF, hypertension, stage IV CKD, COPD, GERD, atrial fibrillation on Eliquis, SSS s/p PPM, type 2 diabetes, gout, and schizophrenia who was admitted on 02/25/2022 for management of increased confusion, shortness of breath, and slurred speech x 1 week.  Since being admitted, patient has received medical support for acute on chronic systolic and diastolic heart failure.  Attempted workup for possible stroke though patient has refused to undergo repeat CT or MRI brain for appropriate workup.  Psychiatry has also been consulted due to patient's history of psychiatric illness with concern this is worsening current mentation.  There is also concern for worsening mild cognitive impairment; has not undergone workup for dementia.  Palliative medicine team consulted to assist with complex medical decision making.  Extensive review of EMR prior to seeing patient.  Presented to bedside and introduced myself to patient.  Patient laying comfortably in bed.  No family at bedside during visit.  Patient appears agitated and is easily distracted.  Though patient can focus on 1 thing such as needing to get lifted up in the bed, she is unable to discuss more complex matters with me at this time.  Patient became hyper focused on wanting to be adjusted in bed so went to inform bedside RN and tech as per patient's request.  Discussed care with bedside RN after visit.  Informed patient's mental status has continued to wax and wane.  Was informed patient's son, Jori Moll, will likely be coming later today.  Asked to be informed when family at bedside.  Primary Diagnoses   Present on Admission:  Pulmonary edema   Past Medical History:  Diagnosis Date   Abdominal wall hernia 01/2017   Advanced care planning/counseling discussion 09/29/2021   Atrial fibrillation (HCC)    CHF (congestive heart failure) (HCC)    CKD (chronic kidney disease)    COPD (chronic obstructive pulmonary disease) (HCC)    Coronary artery disease    NON-CRITICAL   Ectasis aorta (HCC)    GERD (gastroesophageal reflux disease)    Gout    Hypertension    Memory difficulties 12/02/2013   Pacemaker    Psychosis ([place])    Tardive dyskinesia 12/02/2013   Type 2 diabetes mellitus ([place])    Social History   Socioeconomic History   Marital status: Divorced    Spouse name: Not on file   Number of children: 3   Years of education: 14   Highest education level: Associate degree: academic program  Occupational History   Not on file  Tobacco Use   Smoking status: Every Day    Types: Cigarettes    Last attempt to quit: 03/15/2021    Years since quitting: 0.9   Smokeless tobacco: Never   Tobacco comments:    1cig a day  Vaping Use   Vaping Use: Former  Substance and Sexual Activity   Alcohol use: No    Alcohol/week: 0.0 standard drinks of alcohol   Drug use: No   Sexual activity: Not Currently    Partners: Male    Birth control/protection: Post-menopausal    Comment: BTL  Other Topics Concern   Not on file  Social History Narrative   Patient lives with her son in [place] in a one story apartment.    Son is patients caregiver ([name].)    Patient is wheelchair bound, except for bedtime.    Patient enjoys listing to music and visiting family.    Social Determinants of Health   Financial Resource Strain: Low Risk  (12/27/2020)   Overall Financial Resource Strain (CARDIA)    Difficulty of Paying Living Expenses: Not hard at all  Food Insecurity: No Food Insecurity (02/25/2022)   Hunger Vital Sign    Worried About Running Out of Food in the Last Year: Never true    Ran Out  of Food in the Last Year: Never true  Transportation Needs: No Transportation Needs (02/25/2022)   PRAPARE - Hydrologist (Medical): No    Lack of Transportation (Non-Medical): No  Physical Activity: Inactive (12/27/2020)   Exercise Vital Sign    Days of Exercise per Week: 0 days    Minutes of Exercise per Session: 0 min  Stress: No Stress Concern Present (12/27/2020)   [place]    Feeling of Stress : Only a little  Social Connections: Socially Isolated (12/27/2020)   Social Connection and Isolation Panel [NHANES]    Frequency of Communication with Friends and Family: More than three times a week    Frequency of Social Gatherings with Friends and Family: More than three times a week    Attends Religious Services: Never    Marine scientist or Organizations: No    Attends Music therapist: Never    Marital Status: Divorced   Family History  Problem Relation Age of Onset   Heart disease Mother    Heart disease Father    Stroke Sister    Diabetes type II Son    Scheduled Meds:  allopurinol  100 mg Oral Daily   amLODipine  10 mg Oral Daily   apixaban  5 mg Oral BID   divalproex  250 mg Oral Q12H   furosemide  40 mg Intravenous Q12H   insulin aspart  0-9 Units Subcutaneous TID WC   multivitamin with minerals  1 tablet Oral Daily   pravastatin  40 mg Oral QPM   sodium bicarbonate  1,300 mg Oral BID   umeclidinium-vilanterol  1 puff Inhalation Daily   Continuous Infusions: PRN Meds:.acetaminophen, hydrALAZINE, ipratropium-albuterol, ondansetron **OR** ondansetron (ZOFRAN) IV, senna Allergies  Allergen Reactions   Aripiprazole Anaphylaxis, Other (See Comments) and Shortness Of Breath    Tremors   Clozapine Anaphylaxis   Benadryl [Diphenhydramine Hcl] Other (See Comments)    States affects her vision   Benztropine Other (See Comments)    Affects mobility   Latuda  [Lurasidone Hcl] Other (See Comments)    "Tremors and shakes."    Lorazepam Other (See Comments)    Unknown reaction??   Remeron [Mirtazapine] Other (See Comments)    Tremors and shakes   Haloperidol Lactate Other (See Comments)    Unknown reaction??   Keflex [Cephalexin] Swelling and Other (See Comments)    Per patient, has tongue swelling with keflex but can tolerate amoxicillin   Codeine Other (See Comments)    Unknown reaction??   Latex Rash   CBC:    Component Value Date/Time   WBC 12.4 (H) 02/27/2022 0451   HGB 9.5 (L) 02/27/2022 0451   HGB 10.6 (L) 02/22/2022 1659   HCT 30.7 (L) 02/27/2022 0451  HCT 33.1 (L) 02/22/2022 1659   PLT 291 02/27/2022 0451   PLT 294 02/22/2022 1659   MCV 85.8 02/27/2022 0451   MCV 84 02/22/2022 1659   NEUTROABS 8.8 (H) 02/25/2022 1420   NEUTROABS 7.7 (H) 06/21/2021 1416   LYMPHSABS 2.1 02/25/2022 1420   LYMPHSABS 3.7 (H) 06/21/2021 1416   MONOABS 0.9 02/25/2022 1420   EOSABS 0.2 02/25/2022 1420   EOSABS 0.2 06/21/2021 1416   BASOSABS 0.1 02/25/2022 1420   BASOSABS 0.1 06/21/2021 1416   Comprehensive Metabolic Panel:    Component Value Date/Time   NA 137 02/27/2022 0451   NA 136 02/22/2022 1659   K 3.9 02/27/2022 0451   CL 108 02/27/2022 0451   CO2 21 (L) 02/27/2022 0451   BUN 30 (H) 02/27/2022 0451   BUN 26 02/22/2022 1659   CREATININE 1.47 (H) 02/27/2022 0451   CREATININE 1.03 (H) 04/15/2015 1444   GLUCOSE 134 (H) 02/27/2022 0451   CALCIUM 8.9 02/27/2022 0451   AST 22 02/25/2022 1420   ALT 15 02/25/2022 1420   ALKPHOS 46 02/25/2022 1420   BILITOT 0.5 02/25/2022 1420   BILITOT 0.2 01/11/2022 1700   PROT 6.1 (L) 02/25/2022 1420   PROT 6.4 01/11/2022 1700   ALBUMIN 3.1 (L) 02/25/2022 1420   ALBUMIN 4.3 01/11/2022 1700    Physical Exam: Vital Signs: BP (!) 165/35 (BP Location: Right Arm)   Pulse (!) 59   Temp 97.9 F (36.6 C) (Axillary)   Resp 20   Ht 5' 6"$  (1.676 m)   Wt 128.5 kg   SpO2 94%   BMI 45.71 kg/m   SpO2: SpO2: 94 % O2 Device: O2 Device: Room Air O2 Flow Rate:   Intake/output summary:  Intake/Output Summary (Last 24 hours) at 02/27/2022 [id number] Last data filed at 02/26/2022 2303 Gross per 24 hour  Intake 480 ml  Output 1000 ml  Net -520 ml   LBM: Last BM Date : 02/26/22 Baseline Weight: Weight: 127 kg Most recent weight: Weight: 128.5 kg  General: NAD, awake, laying in bed, chronically ill-appearing Eyes: No drainage noted HENT:  moist mucous membranes Cardiovascular: RRR, pedal edema b/l Respiratory: no increased work of breathing noted, not in respiratory distress Abdomen: not distended Skin: no rashes or lesions on visible skin Neuro: While able to carry on simple conversation, becomes hyper focused and unwilling to discuss other topics Psych: Appears agitated         Palliative Performance Scale: 40%               Additional Data Reviewed: Recent Labs    02/26/22 1246 02/27/22 0451  WBC 11.3* 12.4*  HGB 10.2* 9.5*  PLT 316 291  NA 138 137  BUN 30* 30*  CREATININE 1.49* 1.47*    Imaging: ECHOCARDIOGRAM COMPLETE    ECHOCARDIOGRAM REPORT       Patient Name:   [place] Date of Exam: 02/26/2022 Medical Rec #:  [id number]       Height:       66.0 in Accession #:    [id number]      Weight:       283.7 lb Date of Birth:  01-10-1946       BSA:          2.320 m Patient Age:    29 years        BP:           174/52 mmHg Patient Gender: F  HR:           70 bpm. Exam Location:  Inpatient  Procedure: 2D Echo, Cardiac Doppler and Color Doppler  Indications:    CHF-Acute Diastolic [id number]   History:        Patient has prior history of Echocardiogram examinations, most                 recent 11/28/2020. CAD, Pacemaker, COPD, Arrythmias:Atrial                 Fibrillation; Risk Factors:Diabetes. Chronic kidney disease.   Sonographer:    Darlina Sicilian RDCS Referring Phys: Theola Sequin    Sonographer Comments: Imaging difficult due to altered  mental status. IMPRESSIONS   1. Left ventricular ejection fraction, by estimation, is 55 to 60%. The left ventricle has normal function. The left ventricle has no regional wall motion abnormalities. There is severe asymmetric left ventricular hypertrophy of the basal-septal  segment. Left ventricular diastolic function could not be evaluated.  2. Right ventricular systolic function is normal. The right ventricular size is normal.  3. Left atrial size was mildly dilated.  4. Right atrial size was mildly dilated.  5. The mitral valve is grossly normal. No evidence of mitral valve regurgitation. No evidence of mitral stenosis.  6. The aortic valve is grossly normal. Aortic valve regurgitation is not visualized. No aortic stenosis is present.  Comparison(s): Prior images reviewed side by side.  Conclusion(s)/Recommendation(s): Difficult images, but LVEF appears grossly normal. This is an improvement from prior EF of 40-45%.  FINDINGS  Left Ventricle: Left ventricular ejection fraction, by estimation, is 55 to 60%. The left ventricle has normal function. The left ventricle has no regional wall motion abnormalities. The left ventricular internal cavity size was normal in size. There is  severe asymmetric left ventricular hypertrophy of the basal-septal segment. Left ventricular diastolic function could not be evaluated due to paced rhythm. Left ventricular diastolic function could not be evaluated.  Right Ventricle: The right ventricular size is normal. Right vetricular wall thickness was not well visualized. Right ventricular systolic function is normal.  Left Atrium: Left atrial size was mildly dilated.  Right Atrium: Right atrial size was mildly dilated.  Pericardium: Trivial pericardial effusion is present. The pericardial effusion is circumferential. Presence of epicardial fat layer.  Mitral Valve: The mitral valve is grossly normal. No evidence of mitral valve regurgitation. No evidence of  mitral valve stenosis.  Tricuspid Valve: The tricuspid valve is grossly normal. Tricuspid valve regurgitation is not demonstrated. No evidence of tricuspid stenosis.  Aortic Valve: The aortic valve is grossly normal. Aortic valve regurgitation is not visualized. No aortic stenosis is present.  Pulmonic Valve: The pulmonic valve was not well visualized. Pulmonic valve regurgitation is not visualized. No evidence of pulmonic stenosis.  Aorta: The aortic root and ascending aorta are structurally normal, with no evidence of dilitation.  Venous: The inferior vena cava was not well visualized.  IAS/Shunts: The atrial septum is grossly normal.  Additional Comments: A device lead is visualized.    LEFT VENTRICLE PLAX 2D LVIDd:         5.20 cm LVIDs:         2.90 cm LV PW:         1.00 cm LV IVS:        1.70 cm LVOT diam:     2.20 cm LV SV:         77 LV SV Index:   33 LVOT Area:  3.80 cm    RIGHT VENTRICLE RV S prime:     17.40 cm/s TAPSE (M-mode): 2.0 cm  LEFT ATRIUM           Index        RIGHT ATRIUM           Index LA diam:      3.90 cm 1.68 cm/m   RA Area:     21.40 cm LA Vol (A2C): 62.4 ml 26.89 ml/m  RA Volume:   64.50 ml  27.80 ml/m LA Vol (A4C): 79.4 ml 34.22 ml/m  AORTIC VALVE LVOT Vmax:   117.00 cm/s LVOT Vmean:  68.000 cm/s LVOT VTI:    0.203 m   AORTA Ao Root diam: 3.00 cm Ao Asc diam:  3.00 cm  MITRAL VALVE MV Area (PHT): 3.31 cm     SHUNTS MV Decel Time: 229 msec     Systemic VTI:  0.20 m MV E velocity: 133.00 cm/s  Systemic Diam: 2.20 cm  Buford Dresser MD Electronically signed by Buford Dresser MD Signature Date/Time: 02/26/2022/4:07:59 PM      Final      I personally reviewed recent imaging.   Palliative Care Assessment and Plan Summary of Established Goals of Care and Medical Treatment Preferences   Patient is 77 year old female with a past medical history of CHF, hypertension, stage IV CKD, COPD, GERD, atrial  fibrillation on Eliquis, SSS s/p PPM, type 2 diabetes, gout, and schizophrenia who was admitted on 02/25/2022 for management of increased confusion, shortness of breath, and slurred speech x 1 week.  Since being admitted, patient has received medical support for acute on chronic systolic and diastolic heart failure.  Attempted workup for possible stroke though patient has refused to undergo repeat CT or MRI brain for appropriate workup.  Psychiatry has also been consulted due to patient's history of psychiatric illness with concern this is worsening current mentation.  There is also concern for worsening mild cognitive impairment; has not undergone workup for dementia.  Palliative medicine team consulted to assist with complex medical decision making.  # Complex medical decision making/goals of care  -Unable to have complex medical discussion with patient during initial visit. Patient became hyper focused on movement in the bed and unwilling to discuss further topics.   -No family present at bedside during visit with patient. Asked staff to inform when son presented to bedside. Of note, patient has two sons listed in [facility]. Should patient be unable to participate in complex medical decision making and not have POA paperwork already completed and is not married, decision making would fall to the majority of reasonably available children over 18yo.   -Agree with continued appropraite adjustment of psychiatric medications as per Psych to determine if helps improve mentation. If improves enough, may be able to participate in conversations regarding goals for medical care. If mental status does not improve inpatient due to delirium, may need further conversations in the outpatient setting.  -  Code Status: Full Code   # Symptom management  -As per primary/Psych  # Psycho-social/Spiritual Support:  - Support System: two sons, sister  # Discharge Planning:  TBD  Thank you for allowing the palliative care team  to participate in the care [facility].  Chelsea Aus, DO Palliative Care Provider PMT # [phone]  If patient remains symptomatic despite maximum doses, please call PMT at [phone] between 0700 and 1900. Outside of these hours, please call attending, as PMT does not have night coverage.  This provider spent a total of 62 minutes providing patient's care.  Includes review of EMR, discussing care with other staff members involved in patient's medical care, obtaining relevant history and information from patient and/or patient's family, and personal review of imaging and lab work. Greater than 50% of the time was spent counseling and coordinating care related to the above assessment and plan.

## 2022-02-27 NOTE — Plan of Care (Signed)
  Problem: Tissue Perfusion: Goal: Adequacy of tissue perfusion will improve Outcome: Progressing   Problem: Coping: Goal: Level of anxiety will decrease Outcome: Not Progressing

## 2022-02-28 ENCOUNTER — Inpatient Hospital Stay (HOSPITAL_COMMUNITY): Payer: 59

## 2022-02-28 ENCOUNTER — Encounter (HOSPITAL_COMMUNITY): Payer: Self-pay | Admitting: Internal Medicine

## 2022-02-28 DIAGNOSIS — J810 Acute pulmonary edema: Secondary | ICD-10-CM | POA: Diagnosis not present

## 2022-02-28 LAB — CBC
HCT: 34.4 % — ABNORMAL LOW (ref 36.0–46.0)
Hemoglobin: 10.2 g/dL — ABNORMAL LOW (ref 12.0–15.0)
MCH: 26.5 pg (ref 26.0–34.0)
MCHC: 29.7 g/dL — ABNORMAL LOW (ref 30.0–36.0)
MCV: 89.4 fL (ref 80.0–100.0)
Platelets: 409 10*3/uL — ABNORMAL HIGH (ref 150–400)
RBC: 3.85 MIL/uL — ABNORMAL LOW (ref 3.87–5.11)
RDW: 16.2 % — ABNORMAL HIGH (ref 11.5–15.5)
WBC: 15.4 10*3/uL — ABNORMAL HIGH (ref 4.0–10.5)
nRBC: 0 % (ref 0.0–0.2)

## 2022-02-28 LAB — BASIC METABOLIC PANEL
Anion gap: 11 (ref 5–15)
BUN: 32 mg/dL — ABNORMAL HIGH (ref 8–23)
CO2: 25 mmol/L (ref 22–32)
Calcium: 9.2 mg/dL (ref 8.9–10.3)
Chloride: 104 mmol/L (ref 98–111)
Creatinine, Ser: 2.17 mg/dL — ABNORMAL HIGH (ref 0.44–1.00)
GFR, Estimated: 23 mL/min — ABNORMAL LOW (ref >60)
Glucose, Bld: 155 mg/dL — ABNORMAL HIGH (ref 70–99)
Potassium: 4.1 mmol/L (ref 3.5–5.1)
Sodium: 140 mmol/L (ref 135–145)

## 2022-02-28 LAB — GLUCOSE, CAPILLARY
Glucose-Capillary: 135 mg/dL — ABNORMAL HIGH (ref 70–99)
Glucose-Capillary: 142 mg/dL — ABNORMAL HIGH (ref 70–99)
Glucose-Capillary: 201 mg/dL — ABNORMAL HIGH (ref 70–99)
Glucose-Capillary: 126 mg/dL — ABNORMAL HIGH (ref 70–99)

## 2022-02-28 MED ORDER — FUROSEMIDE 10 MG/ML IJ SOLN: 40.0000 mg | Freq: Every day | INTRAMUSCULAR | Status: DC

## 2022-02-28 MED ORDER — POLYVINYL ALCOHOL 1.4 % OP SOLN
1.0000 [drp] | OPHTHALMIC | Status: DC | PRN
  Administered 2022-03-03: 1 [drp] via OPHTHALMIC
  Filled 2022-02-28: qty 15

## 2022-02-28 MED ORDER — FUROSEMIDE 20 MG PO TABS
20.0000 mg | ORAL_TABLET | Freq: Every day | ORAL | Status: DC
  Administered 2022-03-01 – 2022-03-03 (×3): 20 mg via ORAL
  Filled 2022-02-28 (×3): qty 1

## 2022-02-28 NOTE — Plan of Care (Signed)
  Problem: Education: Goal: Ability to demonstrate management of disease process will improve Outcome: Progressing Goal: Ability to verbalize understanding of medication therapies will improve Outcome: Progressing   Problem: Cardiac: Goal: Ability to achieve and maintain adequate cardiopulmonary perfusion will improve Outcome: Progressing   Problem: Health Behavior/Discharge Planning: Goal: Ability to manage health-related needs will improve Outcome: Progressing   Problem: Clinical Measurements: Goal: Ability to maintain clinical measurements within normal limits will improve Outcome: Progressing Goal: Will remain free from infection Outcome: Progressing Goal: Diagnostic test results will improve Outcome: Progressing Goal: Respiratory complications will improve Outcome: Progressing Goal: Cardiovascular complication will be avoided Outcome: Progressing   Problem: Activity: Goal: Risk for activity intolerance will decrease Outcome: Progressing   Problem: Coping: Goal: Level of anxiety will decrease Outcome: Progressing   Problem: Pain Managment: Goal: General experience of comfort will improve Outcome: Progressing   Problem: Safety: Goal: Ability to remain free from injury will improve Outcome: Progressing   Problem: Fluid Volume: Goal: Ability to maintain a balanced intake and output will improve Outcome: Progressing   Problem: Health Behavior/Discharge Planning: Goal: Ability to identify and utilize available resources and services will improve Outcome: Progressing Goal: Ability to manage health-related needs will improve Outcome: Progressing   Problem: Tissue Perfusion: Goal: Adequacy of tissue perfusion will improve Outcome: Progressing   Problem: Activity: Goal: Capacity to carry out activities will improve Outcome: Adequate for Discharge   Problem: Education: Goal: Knowledge of General Education information will improve Description: Including pain  rating scale, medication(s)/side effects and non-pharmacologic comfort measures Outcome: Adequate for Discharge   Problem: Nutrition: Goal: Adequate nutrition will be maintained Outcome: Adequate for Discharge   Problem: Elimination: Goal: Will not experience complications related to bowel motility Outcome: Adequate for Discharge Goal: Will not experience complications related to urinary retention Outcome: Adequate for Discharge   Problem: Skin Integrity: Goal: Risk for impaired skin integrity will decrease Outcome: Adequate for Discharge   Problem: Education: Goal: Ability to describe self-care measures that may prevent or decrease complications (Diabetes Survival Skills Education) will improve Outcome: Adequate for Discharge   Problem: Coping: Goal: Ability to adjust to condition or change in health will improve Outcome: Adequate for Discharge   Problem: Metabolic: Goal: Ability to maintain appropriate glucose levels will improve Outcome: Adequate for Discharge   Problem: Nutritional: Goal: Maintenance of adequate nutrition will improve Outcome: Adequate for Discharge Goal: Progress toward achieving an optimal weight will improve Outcome: Adequate for Discharge   Problem: Skin Integrity: Goal: Risk for impaired skin integrity will decrease Outcome: Adequate for Discharge   Problem: Education: Goal: Individualized Educational Video(s) Outcome: Not Applicable   Problem: Education: Goal: Individualized Educational Video(s) Outcome: Not Applicable

## 2022-02-28 NOTE — Consult Note (Signed)
  Receive message from primary team regarding son requesting update.  This nurse practitioner initiated a phone call with patient's son [name], who inquires about his mother's long-term plan.  He states he is concerned as he is pursuing legal guardianship and trying to get her enrolled in the caps program.  He further reports that his mother continues to fluctuate with cognitive impairment "good days and bad days", and she consented to an MRI today.  Did discuss with son, MRIs are generally scheduled as they can take several hours, unless emergency or life-threatening situation.  While he vocalizes understanding he continues to express frustration surrounding need for MRI today after mother consented.  He is also updated on current Haldol dose and cardiac clearance from her cardiologist.  This dose will be titrated slowly and evaluated on a day to day basis going forward.  He continues to express intent of his mother returning home despite her long-term prognosis.  He is advised psychiatric consult service will continue to follow and adjust medication as needed.  He is requesting updates on a frequent basis, and provided the follow-up contact number of [phone].

## 2022-02-28 NOTE — Progress Notes (Signed)
Patient agreeable to try CPAP. Placed on auto-titration 4-20cmH2O via nasal mask. Stayed with patient in attempt to alleviate anxiety. After approximately 2 minutes, patient became more short of breath and refuses to stay on the CPAP. She demands to be off and placed on supplemental oxygen. Remains on 2L nasal O2 at this time.

## 2022-02-28 NOTE — TOC Progression Note (Addendum)
Transition of Care [facility]) - Progression Note    Patient Details  Name: Abigail Wallace MRN: [id number] Date of Birth: 19-Sep-1945  Transition of Care [facility]) CM/SW Contact  Zelphia Glover, Juliann Pulse, RN Phone Number: 02/28/2022, 12:47 PM  Clinical Narrative: awaiting MD to sign pasrr. Left vm w/[name](son) for call back with bed offers. List in rm.await choice for ST SNF.   -2:29p-additional info sent for pasrr.awaiting pasrr, & choice for ST SNF. -3:56p-Pasrr added to [id number].    Expected Discharge Plan: [place] Barriers to Discharge: Continued Medical Work up  Expected Discharge Plan and Services   Discharge Planning Services: CM Consult   Living arrangements for the past 2 months: Single Family Home                                       Social Determinants of Health (SDOH) Interventions SDOH Screenings   Food Insecurity: No Food Insecurity (02/25/2022)  Housing: Low Risk  (02/25/2022)  Transportation Needs: No Transportation Needs (02/25/2022)  Utilities: Not At Risk (02/25/2022)  Alcohol Screen: Low Risk  (12/27/2020)  Depression (PHQ2-9): High Risk (02/22/2022)  Financial Resource Strain: Low Risk  (12/27/2020)  Physical Activity: Inactive (12/27/2020)  Social Connections: Socially Isolated (12/27/2020)  Stress: No Stress Concern Present (12/27/2020)  Tobacco Use: High Risk (02/26/2022)    Readmission Risk Interventions     No data to display

## 2022-02-28 NOTE — Progress Notes (Signed)
Patient noted to be sleeping, in NAD with stable VS. Awakened and offered CPAP. Patient adamantly refuses to try again tonight. Equipment remains at bedside. RT will continue to follow.

## 2022-02-28 NOTE — TOC PASRR Note (Signed)
[place] Note   Patient Details  Name: Abigail Wallace Date of Birth: 1945-01-19   Transition of Care [facility]) CM/SW Contact:    Dessa Phi, RN Phone Number: 02/28/2022, 12:36 PM  To Whom It May Concern:  Please be advised that this patient will require a short-term nursing home stay - anticipated 30 days or less for rehabilitation and strengthening.   The plan is for return home.

## 2022-02-28 NOTE — Hospital Course (Signed)
IMPRESSION: 1. No evidence of acute intracranial hemorrhage or acute infarct. 2. 5 mm dural-based mass overlying the mid-to-anterior left frontal lobe compatible with a small meningioma. No mass effect upon the underlying brain parenchyma. 3. Redemonstrated chronic lacunar infarct within the left thalamus.

## 2022-02-28 NOTE — Progress Notes (Signed)
PROGRESS NOTE   Abigail Wallace  [id number] DOB: 1945/05/01 DOA: 02/25/2022 PCP: Abigail Malay, MD this is a family medicine patient teaching service patient and should be admitted at [facility] primarily   Brief Narrative:   77 year old white female (wheelchair dependent) known schizophrenia with tactile hallucinations complicated by dysphagia (refuses scope) Complete heart block 2011 status post pacemaker, atrial fibrillation CHADVASC >4 on Eliquis Nonobstructive cath 08/2010 Prior tobacco with likely underlying COPD, HLD, DM TY 2 CKD 3B Psychosis status post hospitalizations at behavioral hospitals in [place] in the past Documentation from teaching service on 2/8 shows that she was started on Lasix by her nephrologist within the past month and had been treated with a 10-day course of Cipro Brought to emergency room secondary to lower extremity swelling unable to stand for several days choking a lot more than normal low-grade fevers  BNP 690 troponin 18 WBC 12,000 COVID-negative, ammonia level 11 CXR = CHF Refused CT head for workup of stroke 2/12 = echocardiogram = severe asymmetric LVH, 2/13 psychiatry consulted, palliative care consulted    Hospital-Problem based course  Decompensated HFrEF-->HFpEF 55% today -Drop bid--> Lasix 40 IV  x 1 and then switch to home dose of Lasix 20 in a.m. given acute rise in creatinine  - Metoprolol 50 twice daily held secondary to the acute decompensation and will need to resume in the next several days - Losartan 5 held on admission -Continues on amlodipine 10 hydralazine 25 every 8 as needed systolic blood pressure [id number]  Acute urinary retention - Retaining over 300 cc of urine, will need In-N-Out cath and indwelling if remains obstructed - Outpatient follow-up  Acute superimposed on toxic metabolic encephalopathy on admission -Seems coherent pleasant at this time and not confused, ammonia level was 11  Underlying  schizophrenia - Continue Depakote 250 twice daily, follow valproic acid level scheduled for 2/15 - Continue Haldol 2 mg twice daily  Sick sinus syndrome with pacemaker since 2011, CHADVASC >4 on Eliquis Prolonged QTc of 535 on 02/27/2022 -Metoprolol held as above -Watch QTc and keep on monitors -Continue Eliquis 5 twice daily  Presumed COPD with prior tobacco  DM TY 2 underlying CKD 3B -CBGs 120-150 - Sliding scale alone at this time, Tradjenta 5 daily on hold  Sacral decubitus ulcers -Not examined today, see below  ?  Dysphagia Leukocytosis -Leukocytosis seems chronic -Someone performed a sed rate in 2023---will investigate little further - Chest x-ray 02/28/2022 showed only cardiomegaly -May have?  Low-grade underlying Aspiration? Skin maceraiton  -Dysphagia 3 diet as per SLP eval 02/26/2022  Mild underlying dementia -See above   DVT prophylaxis: apixaban 5 bid Code Status: Full code Family Communication: None present at bedside Disposition:  Status is: Inpatient Remains inpatient appropriate because:   Needs adjustment of diuretics and monitoring of renal function in the setting of prolonged QTc Looks like will need skilled care placement     Subjective: Pleasant/pleasantly confused at times-not violent No distress "I cannot pee"  Objective: Vitals:   02/27/22 1429 02/27/22 1700 02/27/22 2125 02/28/22 0415  BP: (!) 159/131 (!) 162/54 (!) 161/57 (!) 158/48  Pulse: 70  63 60  Resp: 15 19 20 20  $ Temp: 98.1 F (36.7 C)  99.6 F (37.6 C) 98.2 F (36.8 C)  TempSrc: Oral  Oral Oral  SpO2: 90%  91% 90%  Weight:    128 kg  Height:        Intake/Output Summary (Last 24 hours) at 02/28/2022 [id number] Last data filed  at 02/27/2022 1250 Gross per 24 hour  Intake 0 ml  Output 500 ml  Net -500 ml   Filed Weights   02/26/22 0500 02/27/22 0420 02/28/22 0415  Weight: 128.7 kg 128.5 kg 128 kg    Examination:  EOMI NCAT no focal deficit neck soft supple no icterus  no pallor Mallampati 4 Chest is clear no added sound no wheeze no rales no rhonchi Abdomen is distended she does seem to have a little bit of tenderness Lower extremities are soft no ulceration no wound  Data Reviewed: personally reviewed   CBC    Component Value Date/Time   WBC 15.4 (H) 02/28/2022 0503   RBC 3.85 (L) 02/28/2022 0503   HGB 10.2 (L) 02/28/2022 0503   HGB 10.6 (L) 02/22/2022 1659   HCT 34.4 (L) 02/28/2022 0503   HCT 33.1 (L) 02/22/2022 1659   PLT 409 (H) 02/28/2022 0503   PLT 294 02/22/2022 1659   MCV 89.4 02/28/2022 0503   MCV 84 02/22/2022 1659   MCH 26.5 02/28/2022 0503   MCHC 29.7 (L) 02/28/2022 0503   RDW 16.2 (H) 02/28/2022 0503   RDW 15.4 02/22/2022 1659   LYMPHSABS 2.1 02/25/2022 1420   LYMPHSABS 3.7 (H) 06/21/2021 1416   MONOABS 0.9 02/25/2022 1420   EOSABS 0.2 02/25/2022 1420   EOSABS 0.2 06/21/2021 1416   BASOSABS 0.1 02/25/2022 1420   BASOSABS 0.1 06/21/2021 1416      Latest Ref Rng & Units 02/28/2022    5:03 AM 02/27/2022    4:51 AM 02/26/2022   12:46 PM  CMP  Glucose 70 - 99 mg/dL 155  134  138   BUN 8 - 23 mg/dL 32  30  30   Creatinine 0.44 - 1.00 mg/dL 2.17  1.47  1.49   Sodium 135 - 145 mmol/L 140  137  138   Potassium 3.5 - 5.1 mmol/L 4.1  3.9  3.2   Chloride 98 - 111 mmol/L 104  108  106   CO2 22 - 32 mmol/L 25  21  24   $ Calcium 8.9 - 10.3 mg/dL 9.2  8.9  9.3      Radiology Studies: ECHOCARDIOGRAM COMPLETE  Result Date: 02/26/2022    ECHOCARDIOGRAM REPORT   Patient Name:   Abigail Wallace Date of Exam: 02/26/2022 Medical Rec #:  [id number]       Height:       66.0 in Accession #:    [id number]      Weight:       283.7 lb Date of Birth:  1945-10-28       BSA:          2.320 m Patient Age:    51 years        BP:           174/52 mmHg Patient Gender: F               HR:           70 bpm. Exam Location:  Inpatient Procedure: 2D Echo, Cardiac Doppler and Color Doppler Indications:    CHF-Acute Diastolic [id number]  History:        Patient has  prior history of Echocardiogram examinations, most                 recent 11/28/2020. CAD, Pacemaker, COPD, Arrythmias:Atrial                 Fibrillation; Risk Factors:Diabetes. Chronic kidney disease.  Sonographer:  Darlina Sicilian RDCS Referring Phys: [id number] [facility]  Sonographer Comments: Imaging difficult due to altered mental status. IMPRESSIONS  1. Left ventricular ejection fraction, by estimation, is 55 to 60%. The left ventricle has normal function. The left ventricle has no regional wall motion abnormalities. There is severe asymmetric left ventricular hypertrophy of the basal-septal segment. Left ventricular diastolic function could not be evaluated.  2. Right ventricular systolic function is normal. The right ventricular size is normal.  3. Left atrial size was mildly dilated.  4. Right atrial size was mildly dilated.  5. The mitral valve is grossly normal. No evidence of mitral valve regurgitation. No evidence of mitral stenosis.  6. The aortic valve is grossly normal. Aortic valve regurgitation is not visualized. No aortic stenosis is present. Comparison(s): Prior images reviewed side by side. Conclusion(s)/Recommendation(s): Difficult images, but LVEF appears grossly normal. This is an improvement from prior EF of 40-45%. FINDINGS  Left Ventricle: Left ventricular ejection fraction, by estimation, is 55 to 60%. The left ventricle has normal function. The left ventricle has no regional wall motion abnormalities. The left ventricular internal cavity size was normal in size. There is  severe asymmetric left ventricular hypertrophy of the basal-septal segment. Left ventricular diastolic function could not be evaluated due to paced rhythm. Left ventricular diastolic function could not be evaluated. Right Ventricle: The right ventricular size is normal. Right vetricular wall thickness was not well visualized. Right ventricular systolic function is normal. Left Atrium: Left atrial size was mildly dilated.  Right Atrium: Right atrial size was mildly dilated. Pericardium: Trivial pericardial effusion is present. The pericardial effusion is circumferential. Presence of epicardial fat layer. Mitral Valve: The mitral valve is grossly normal. No evidence of mitral valve regurgitation. No evidence of mitral valve stenosis. Tricuspid Valve: The tricuspid valve is grossly normal. Tricuspid valve regurgitation is not demonstrated. No evidence of tricuspid stenosis. Aortic Valve: The aortic valve is grossly normal. Aortic valve regurgitation is not visualized. No aortic stenosis is present. Pulmonic Valve: The pulmonic valve was not well visualized. Pulmonic valve regurgitation is not visualized. No evidence of pulmonic stenosis. Aorta: The aortic root and ascending aorta are structurally normal, with no evidence of dilitation. Venous: The inferior vena cava was not well visualized. IAS/Shunts: The atrial septum is grossly normal. Additional Comments: A device lead is visualized.  LEFT VENTRICLE PLAX 2D LVIDd:         5.20 cm LVIDs:         2.90 cm LV PW:         1.00 cm LV IVS:        1.70 cm LVOT diam:     2.20 cm LV SV:         77 LV SV Index:   33 LVOT Area:     3.80 cm  RIGHT VENTRICLE RV S prime:     17.40 cm/s TAPSE (M-mode): 2.0 cm LEFT ATRIUM           Index        RIGHT ATRIUM           Index LA diam:      3.90 cm 1.68 cm/m   RA Area:     21.40 cm LA Vol (A2C): 62.4 ml 26.89 ml/m  RA Volume:   64.50 ml  27.80 ml/m LA Vol (A4C): 79.4 ml 34.22 ml/m  AORTIC VALVE LVOT Vmax:   117.00 cm/s LVOT Vmean:  68.000 cm/s LVOT VTI:    0.203 m  AORTA Ao  Root diam: 3.00 cm Ao Asc diam:  3.00 cm MITRAL VALVE MV Area (PHT): 3.31 cm     SHUNTS MV Decel Time: 229 msec     Systemic VTI:  0.20 m MV E velocity: 133.00 cm/s  Systemic Diam: 2.20 cm Buford Dresser MD Electronically signed by Buford Dresser MD Signature Date/Time: 02/26/2022/4:07:59 PM    Final      Scheduled Meds:  allopurinol  100 mg Oral Daily    amLODipine  10 mg Oral Daily   apixaban  5 mg Oral BID   divalproex  250 mg Oral Q12H   furosemide  40 mg Intravenous Q12H   haloperidol  2 mg Oral BID   Or   haloperidol lactate  2 mg Intravenous BID   insulin aspart  0-9 Units Subcutaneous TID WC   multivitamin with minerals  1 tablet Oral Daily   pravastatin  40 mg Oral QPM   sodium bicarbonate  1,300 mg Oral BID   umeclidinium-vilanterol  1 puff Inhalation Daily   Continuous Infusions:   LOS: 3 days   Time spent: [place], MD [facility] To contact the attending provider between 7A-7P or the covering provider during after hours 7P-7A, please log into the web site [email or web address] and access using universal [place] password for that web site. If you do not have the password, please call the hospital operator.  02/28/2022, 7:37 AM

## 2022-02-28 NOTE — Care Management Important Message (Signed)
Important Message  Patient Details IM Letter given. Name: Abigail Wallace MRN: [id number] Date of Birth: 04-Jan-1946   Medicare Important Message Given:  Yes     [name] 02/28/2022, 3:07 PM

## 2022-03-01 DIAGNOSIS — F201 Disorganized schizophrenia: Secondary | ICD-10-CM | POA: Diagnosis not present

## 2022-03-01 DIAGNOSIS — R4781 Slurred speech: Secondary | ICD-10-CM | POA: Diagnosis not present

## 2022-03-01 DIAGNOSIS — J810 Acute pulmonary edema: Secondary | ICD-10-CM | POA: Diagnosis not present

## 2022-03-01 LAB — CBC WITH DIFFERENTIAL/PLATELET
Abs Immature Granulocytes: 0.16 10*3/uL — ABNORMAL HIGH (ref 0.00–0.07)
Basophils Absolute: 0.1 10*3/uL (ref 0.0–0.1)
Basophils Relative: 1 %
Eosinophils Absolute: 0.4 10*3/uL (ref 0.0–0.5)
Eosinophils Relative: 3 %
HCT: 33.4 % — ABNORMAL LOW (ref 36.0–46.0)
Hemoglobin: 9.8 g/dL — ABNORMAL LOW (ref 12.0–15.0)
Immature Granulocytes: 1 %
Lymphocytes Relative: 17 %
Lymphs Abs: 2.3 10*3/uL (ref 0.7–4.0)
MCH: 26.5 pg (ref 26.0–34.0)
MCHC: 29.3 g/dL — ABNORMAL LOW (ref 30.0–36.0)
MCV: 90.3 fL (ref 80.0–100.0)
Monocytes Absolute: 1.1 10*3/uL — ABNORMAL HIGH (ref 0.1–1.0)
Monocytes Relative: 8 %
Neutro Abs: 9.4 10*3/uL — ABNORMAL HIGH (ref 1.7–7.7)
Neutrophils Relative %: 70 %
Platelets: 341 10*3/uL (ref 150–400)
RBC: 3.7 MIL/uL — ABNORMAL LOW (ref 3.87–5.11)
RDW: 16.2 % — ABNORMAL HIGH (ref 11.5–15.5)
WBC: 13.5 10*3/uL — ABNORMAL HIGH (ref 4.0–10.5)
nRBC: 0 % (ref 0.0–0.2)

## 2022-03-01 LAB — BASIC METABOLIC PANEL
Anion gap: 12 (ref 5–15)
BUN: 37 mg/dL — ABNORMAL HIGH (ref 8–23)
CO2: 24 mmol/L (ref 22–32)
Calcium: 8.9 mg/dL (ref 8.9–10.3)
Chloride: 103 mmol/L (ref 98–111)
Creatinine, Ser: 2.73 mg/dL — ABNORMAL HIGH (ref 0.44–1.00)
GFR, Estimated: 17 mL/min — ABNORMAL LOW (ref >60)
Glucose, Bld: 149 mg/dL — ABNORMAL HIGH (ref 70–99)
Potassium: 4 mmol/L (ref 3.5–5.1)
Sodium: 139 mmol/L (ref 135–145)

## 2022-03-01 LAB — GLUCOSE, CAPILLARY
Glucose-Capillary: 245 mg/dL — ABNORMAL HIGH (ref 70–99)
Glucose-Capillary: 129 mg/dL — ABNORMAL HIGH (ref 70–99)
Glucose-Capillary: 135 mg/dL — ABNORMAL HIGH (ref 70–99)
Glucose-Capillary: 121 mg/dL — ABNORMAL HIGH (ref 70–99)

## 2022-03-01 NOTE — TOC Progression Note (Signed)
Transition of Care [facility]) - Progression Note    Patient Details  Name: Devri Kingston MRN: [id number] Date of Birth: [birth year]  [facility] CM/SW [place], [place] Phone Number: 03/01/2022, 1:02 PM  Clinical Narrative:     CSW called and left message with pt son requesting return call. He called back promptly. CSW requested SNF choice from son. He states he is visiting one of the SNFs in [place] and that he would contact [place] with decision after visiting SNFs.  He discusses potential for pt to return home. She has PCS services through [place] and son is working with a CAPS caseworker to secure CAPS services. CSW explains hospital could work on arranging [facility] PT if they decided on her returning home. He plans to visits SNFs first before deciding.   Expected Discharge Plan: [place] Barriers to Discharge: Continued Medical Work up  Expected Discharge Plan and Services   Discharge Planning Services: CM Consult   Living arrangements for the past 2 months: Single Family Home                                       Social Determinants of Health (SDOH) Interventions SDOH Screenings   Food Insecurity: No Food Insecurity (02/25/2022)  Housing: Low Risk  (02/25/2022)  Transportation Needs: No Transportation Needs (02/25/2022)  Utilities: Not At Risk (02/25/2022)  Alcohol Screen: Low Risk  (12/27/2020)  Depression (PHQ2-9): High Risk (02/22/2022)  Financial Resource Strain: Low Risk  (12/27/2020)  Physical Activity: Inactive (12/27/2020)  Social Connections: Socially Isolated (12/27/2020)  Stress: No Stress Concern Present (12/27/2020)  Tobacco Use: High Risk (02/28/2022)    Readmission Risk Interventions     No data to display

## 2022-03-01 NOTE — Progress Notes (Signed)
Physical Therapy Treatment Patient Details Name: Abigail Wallace MRN: [id number] DOB: 02-18-1945 Today's Date: 03/01/2022   History of Present Illness Patient is a 77 year old female who presented with slurred speech, shortness of breath and confusion. Patient was admitted with acute on chronic systolic and diastolic heart failure, acute metabolic encephalopathy, and elevated troponin. PMH: schizophrenia, CHF, HTN, CKD IV, COPD, A fib, DM II, infrarenal AAA, hernia    PT Comments    Pt is slowly progressing toward acute PT goals with performance of transfers today. Pt currently requiring +2 assist for all mobility and anxious about mobility due to fear of falling. Pt will benefit from continued skilled PT to increase her independence and maximize safety with mobility.     Recommendations for follow up therapy are one component of a multi-disciplinary discharge planning process, led by the attending physician.  Recommendations may be updated based on patient status, additional functional criteria and insurance authorization.  Follow Up Recommendations  Skilled nursing-short term rehab (<3 hours/day) Can patient physically be transported by private vehicle: No   Assistance Recommended at Discharge Frequent or constant Supervision/Assistance  Patient can return home with the following Direct supervision/assist for financial management;A lot of help with bathing/dressing/bathroom;Two people to help with walking and/or transfers;Assistance with cooking/housework;Assist for transportation;Help with stairs or ramp for entrance   Equipment Recommendations  None recommended by PT    Recommendations for Other Services       Precautions / Restrictions Precautions Precautions: Fall Restrictions Weight Bearing Restrictions: No     Mobility  Bed Mobility Overal bed mobility: Needs Assistance Bed Mobility: Sit to Supine       Sit to supine: Max assist, +2 for physical assistance, +2 for  safety/equipment   General bed mobility comments: pt in recliner upon entry. Assist for B LEs onto EOb as pt with difficulty maintainign standing long enough to fully gets hips midline to bed prior to sitting and not fully on bed.    Transfers Overall transfer level: Needs assistance Equipment used: None Transfers: Sit to/from Stand, Bed to chair/wheelchair/BSC Sit to Stand: Mod assist, +2 physical assistance, +2 safety/equipment   Step pivot transfers: Min assist, +2 safety/equipment       General transfer comment: MOD A+2 for power up to stand x2. Use of handrail on BSC to go from recliner to [facility] and then use of bed rail for assist from [facility] to bed. Forward flexed posture and downward gaze throughout transfers cues for upright. RN present throughout to assist. Pt fatigued and politely deferring additional mobility today.    Ambulation/Gait                   Stairs             Wheelchair Mobility    Modified Rankin (Stroke Patients Only)       Balance Overall balance assessment: Needs assistance Sitting-balance support: No upper extremity supported, Feet supported Sitting balance-Leahy Scale: Fair     Standing balance support: Bilateral upper extremity supported, During functional activity Standing balance-Leahy Scale: Poor                              Cognition Arousal/Alertness: Awake/alert Behavior During Therapy: Anxious Overall Cognitive Status: Within Functional Limits for tasks assessed  General Comments: anxious about falling but pleasant and agreeble        Exercises      General Comments        Pertinent Vitals/Pain Pain Assessment Pain Assessment: No/denies pain    Home Living                          Prior Function            PT Goals (current goals can now be found in the care plan section) Acute Rehab PT Goals Patient Stated Goal: want to be more mobile  and independent PT Goal Formulation: With patient Time For Goal Achievement: 03/12/22 Potential to Achieve Goals: Fair Progress towards PT goals: Progressing toward goals    Frequency    Min 2X/week      PT Plan Current plan remains appropriate    Co-evaluation              AM-PAC PT "6 Clicks" Mobility   Outcome Measure  Help needed turning from your back to your side while in a flat bed without using bedrails?: A Lot Help needed moving from lying on your back to sitting on the side of a flat bed without using bedrails?: A Lot Help needed moving to and from a bed to a chair (including a wheelchair)?: Total Help needed standing up from a chair using your arms (e.g., wheelchair or bedside chair)?: Total Help needed to walk in hospital room?: Total Help needed climbing 3-5 steps with a railing? : Total 6 Click Score: 8    End of Session   Activity Tolerance: Patient tolerated treatment well Patient left: in bed;with call bell/phone within reach;with bed alarm set Nurse Communication: Mobility status PT Visit Diagnosis: History of falling (Z91.81);Muscle weakness (generalized) (M62.81);Other abnormalities of gait and mobility (R26.89)     Time: [id number] PT Time Calculation (min) (ACUTE ONLY): 19 min  Charges:  $Therapeutic Activity: 8-22 mins                     Festus Barren PT, DPT  Acute Rehabilitation Services  Office [phone]  03/01/2022, 4:15 PM

## 2022-03-01 NOTE — Consult Note (Addendum)
[facility] Consult   Reason for Consult: History of schizophrenia, psychosis, refusing care and medications.  Referring Physician: Dr. Karleen Hampshire Patient Identification: Abigail Wallace MRN:  [id number] Principal Diagnosis: Pulmonary edema Diagnosis:  Principal Problem:   Pulmonary edema Active Problems:   Slurred speech   Disorganized schizophrenia ([place])   Counseling and coordination of care   Goals of care, counseling/discussion   Acute on chronic congestive heart failure [facility])   Palliative care encounter   Total Time spent with patient: 1 hour  Subjective:   Abigail Wallace is a 77 y.o. female patient admitted with slurred speech, shortness of breath, and confusion.  Patient does have past medical history of acute on chronic systolic and diastolic heart failure, acute metabolic encephalopathy, elevated troponin, hypertension, CKD stage IV, COPD, intrarenal AAA atrial fibrillation, and diabetes.  Chart review shows patient has history of schizophrenia, symptoms consistent with mild cognitive impairment that has been progressively getting worse over the past 15 years.  On assessment today, patient denies any symptoms of depression or psychosis. Patient initially was feeling distressed by the intensity of her hallucinations and delusions, that was beginning to impact her care. She initially refused labs and procedures. She did not meet criteria for IVC at that time. Starting from yesterday evening, the intensity of her hallucinations were less and patient currently recognizes them as not real. Patient states that she feels comfortable with her son at home and confirmed this during two separate interactions with this provider. She denies any violent thoughts or active/passive homicidal thoughts. Denies any active or passive suicidal thoughts. Patient has not engaged in any violent or self harming behaviors. She declines hospitalization at this time and there is no indication for  involuntary hospitalization. While patient has chronic delusions, she has not demonstrated inability to care for herself (I.e. inability to eat, inability engage in hygiene practices) as a result of these delusions. Given this and her lack of suicidality/homicidality, she does not appear to be at imminent risk or others at this time and is appropriate for outpatient follow up.    HPI:  Abigail Wallace is a 77 y.o. female with medical history significant of CHF, hypertension. Stage 4 CKD, COPD, GERD, atrial fibrillation on eliquis, psychosis,  SSS, s/p PPM, type 2 DM, gout.was brought in by her son for increased confusion, sob and slurred speech since one week . CXR showed mild CHF. CT head without contrast was ordered for evaluation of stroke but patient is refused the test. She continues to refuse medical care intermittently, refusing lab work, MRI of the brain to check for stroke and dementia. She is currently getting IV lasix for her CHF.    Past Psychiatric History: Patient denies, however family at bedside endorses history of schizophrenia.  Son states patient is under the care of [name].  She is currently being managed with Depakote 250 mg p.o. nightly.  She notably was taken olanzapine 7.5 mg p.o. nightly, however last filled history unknown does not appear to be compliant with such.  Risk to Self:  Denies Risk to Others:  Denies Prior Inpatient Therapy:  Denies Prior Outpatient Therapy:  Jobie Quaker per son  Past Medical History:  Past Medical History:  Diagnosis Date   Abdominal wall hernia 01/2017   Advanced care planning/counseling discussion 09/29/2021   Atrial fibrillation (HCC)    CHF (congestive heart failure) ([place])    CKD (chronic kidney disease)    COPD (chronic obstructive pulmonary disease) ([place])  Coronary artery disease    NON-CRITICAL   Ectasis aorta (HCC)    GERD (gastroesophageal reflux disease)    Gout    Hypertension    Memory difficulties 12/02/2013    Pacemaker    Psychosis ([place])    Tardive dyskinesia 12/02/2013   Type 2 diabetes mellitus [facility])     Past Surgical History:  Procedure Laterality Date   CARDIAC CATHETERIZATION  2007   Non-critical.    CARDIOVERSION N/A 01/27/2021   Procedure: CARDIOVERSION;  Surgeon: Geralynn Rile, MD;  Location: [facility];  Service: Cardiovascular;  Laterality: N/A;   CHOLECYSTECTOMY     ELBOW SURGERY     EP IMPLANTABLE DEVICE N/A 08/31/2014   Procedure: PPM Generator Changeout;  Surgeon: Sanda Klein, MD;  Location: [facility];  Service: Cardiovascular;  Laterality: N/A;   INSERT / REPLACE / REMOVE PACEMAKER  2007   [place]/SYMPTOMATIC HEART BLOCK   PACEMAKER PLACEMENT  09/28/2005   2/2 SSS?   Persantine stress test  05/01/2010   EF 66%. Normal LV sys fx. Unchanged from previous studies.    TRANSTHORACIC ECHOCARDIOGRAM  09/08/10   SEVERE CONCENTRIC HYPERTROPHY.LV FUNCTION WAS VIGOROUS.EF 65%-70%.VENTRICULAR SEPTUM-INCOORDINATE MOTION.LEFT ATRIUM-MILDLY DILATED.TRIVIAL TR.   TUBAL LIGATION     Family History:  Family History  Problem Relation Age of Onset   Heart disease Mother    Heart disease Father    Stroke Sister    Diabetes type II Son    Family Psychiatric  History: Unable to assess Social History:  Social History   Substance and Sexual Activity  Alcohol Use No   Alcohol/week: 0.0 standard drinks of alcohol     Social History   Substance and Sexual Activity  Drug Use No    Social History   Socioeconomic History   Marital status: Divorced    Spouse name: Not on file   Number of children: 3   Years of education: 14   Highest education level: Associate degree: academic program  Occupational History   Not on file  Tobacco Use   Smoking status: Every Day    Types: Cigarettes    Last attempt to quit: 03/15/2021    Years since quitting: 0.9   Smokeless tobacco: Never   Tobacco comments:    1cig a day  Vaping Use   Vaping Use: Former  Substance and  Sexual Activity   Alcohol use: No    Alcohol/week: 0.0 standard drinks of alcohol   Drug use: No   Sexual activity: Not Currently    Partners: Male    Birth control/protection: Post-menopausal    Comment: BTL  Other Topics Concern   Not on file  Social History Narrative   Patient lives with her son in [place] in a one story apartment.    Son is patients caregiver ([name].)    Patient is wheelchair bound, except for bedtime.    Patient enjoys listing to music and visiting family.    Social Determinants of Health   Financial Resource Strain: Low Risk  (12/27/2020)   Overall Financial Resource Strain (CARDIA)    Difficulty of Paying Living Expenses: Not hard at all  Food Insecurity: No Food Insecurity (02/25/2022)   Hunger Vital Sign    Worried About Running Out of Food in the Last Year: Never true    Ran Out of Food in the Last Year: Never true  Transportation Needs: No Transportation Needs (02/25/2022)   PRAPARE - Hydrologist (Medical): No  Lack of Transportation (Non-Medical): No  Physical Activity: Inactive (12/27/2020)   Exercise Vital Sign    Days of Exercise per Week: 0 days    Minutes of Exercise per Session: 0 min  Stress: No Stress Concern Present (12/27/2020)   [place]    Feeling of Stress : Only a little  Social Connections: Socially Isolated (12/27/2020)   Social Connection and Isolation Panel [NHANES]    Frequency of Communication with Friends and Family: More than three times a week    Frequency of Social Gatherings with Friends and Family: More than three times a week    Attends Religious Services: Never    Marine scientist or Organizations: No    Attends Archivist Meetings: Never    Marital Status: Divorced   Additional Social History:    Allergies:   Allergies  Allergen Reactions   Aripiprazole Anaphylaxis, Other (See Comments) and  Shortness Of Breath    Tremors   Clozapine Anaphylaxis   Benadryl [Diphenhydramine Hcl] Other (See Comments)    States affects her vision   Benztropine Other (See Comments)    Affects mobility   Latuda [Lurasidone Hcl] Other (See Comments)    "Tremors and shakes."    Lorazepam Other (See Comments)    Unknown reaction??   Remeron [Mirtazapine] Other (See Comments)    Tremors and shakes   Haloperidol Lactate Other (See Comments)    Unknown reaction??   Keflex [Cephalexin] Swelling and Other (See Comments)    Per patient, has tongue swelling with keflex but can tolerate amoxicillin   Codeine Other (See Comments)    Unknown reaction??   Latex Rash    Labs:  Results for orders placed or performed during the hospital encounter of 02/25/22 (from the past 48 hour(s))  Magnesium     Status: None   Collection Time: 02/27/22  3:55 PM  Result Value Ref Range   Magnesium 2.0 1.7 - 2.4 mg/dL    Comment: Performed at [facility], [place] [address] [place], [place] [address]  Glucose, capillary     Status: Abnormal   Collection Time: 02/27/22  5:22 PM  Result Value Ref Range   Glucose-Capillary 150 (H) 70 - 99 mg/dL    Comment: Glucose reference range applies only to samples taken after fasting for at least 8 hours.  Glucose, capillary     Status: Abnormal   Collection Time: 02/27/22  9:27 PM  Result Value Ref Range   Glucose-Capillary 126 (H) 70 - 99 mg/dL    Comment: Glucose reference range applies only to samples taken after fasting for at least 8 hours.  Basic metabolic panel     Status: Abnormal   Collection Time: 02/28/22  5:03 AM  Result Value Ref Range   Sodium 140 135 - 145 mmol/L   Potassium 4.1 3.5 - 5.1 mmol/L   Chloride 104 98 - 111 mmol/L   CO2 25 22 - 32 mmol/L   Glucose, Bld 155 (H) 70 - 99 mg/dL    Comment: Glucose reference range applies only to samples taken after fasting for at least 8 hours.   BUN 32 (H) 8 - 23 mg/dL   Creatinine, Ser 2.17 (H)  0.44 - 1.00 mg/dL   Calcium 9.2 8.9 - 10.3 mg/dL   GFR, Estimated 23 (L) >60 mL/min    Comment: (NOTE) Calculated using the CKD-EPI Creatinine Equation (2021)    Anion gap 11 5 -  15    Comment: Performed at [facility], [place] [address], [place], [place] [address]  CBC     Status: Abnormal   Collection Time: 02/28/22  5:03 AM  Result Value Ref Range   WBC 15.4 (H) 4.0 - 10.5 K/uL   RBC 3.85 (L) 3.87 - 5.11 MIL/uL   Hemoglobin 10.2 (L) 12.0 - 15.0 g/dL   HCT 34.4 (L) 36.0 - 46.0 %   MCV 89.4 80.0 - 100.0 fL   MCH 26.5 26.0 - 34.0 pg   MCHC 29.7 (L) 30.0 - 36.0 g/dL   RDW 16.2 (H) 11.5 - 15.5 %   Platelets 409 (H) 150 - 400 K/uL   nRBC 0.0 0.0 - 0.2 %    Comment: Performed at [facility], [place] [address], [place], [place] [address]  Glucose, capillary     Status: Abnormal   Collection Time: 02/28/22  7:33 AM  Result Value Ref Range   Glucose-Capillary 135 (H) 70 - 99 mg/dL    Comment: Glucose reference range applies only to samples taken after fasting for at least 8 hours.  Glucose, capillary     Status: Abnormal   Collection Time: 02/28/22 11:52 AM  Result Value Ref Range   Glucose-Capillary 142 (H) 70 - 99 mg/dL    Comment: Glucose reference range applies only to samples taken after fasting for at least 8 hours.  Glucose, capillary     Status: Abnormal   Collection Time: 02/28/22  4:50 PM  Result Value Ref Range   Glucose-Capillary 201 (H) 70 - 99 mg/dL    Comment: Glucose reference range applies only to samples taken after fasting for at least 8 hours.  Glucose, capillary     Status: Abnormal   Collection Time: 02/28/22  9:51 PM  Result Value Ref Range   Glucose-Capillary 126 (H) 70 - 99 mg/dL    Comment: Glucose reference range applies only to samples taken after fasting for at least 8 hours.  Basic metabolic panel     Status: Abnormal   Collection Time: 03/01/22  4:54 AM  Result Value Ref Range   Sodium 139 135 - 145 mmol/L    Potassium 4.0 3.5 - 5.1 mmol/L   Chloride 103 98 - 111 mmol/L   CO2 24 22 - 32 mmol/L   Glucose, Bld 149 (H) 70 - 99 mg/dL    Comment: Glucose reference range applies only to samples taken after fasting for at least 8 hours.   BUN 37 (H) 8 - 23 mg/dL   Creatinine, Ser 2.73 (H) 0.44 - 1.00 mg/dL   Calcium 8.9 8.9 - 10.3 mg/dL   GFR, Estimated 17 (L) >60 mL/min    Comment: (NOTE) Calculated using the CKD-EPI Creatinine Equation (2021)    Anion gap 12 5 - 15    Comment: Performed at [facility], [place] [address], [place], [place] [address]  CBC with Differential/Platelet     Status: Abnormal   Collection Time: 03/01/22  4:54 AM  Result Value Ref Range   WBC 13.5 (H) 4.0 - 10.5 K/uL   RBC 3.70 (L) 3.87 - 5.11 MIL/uL   Hemoglobin 9.8 (L) 12.0 - 15.0 g/dL   HCT 33.4 (L) 36.0 - 46.0 %   MCV 90.3 80.0 - 100.0 fL   MCH 26.5 26.0 - 34.0 pg   MCHC 29.3 (L) 30.0 - 36.0 g/dL   RDW 16.2 (H) 11.5 - 15.5 %   Platelets 341 150 - 400  K/uL   nRBC 0.0 0.0 - 0.2 %   Neutrophils Relative % 70 %   Neutro Abs 9.4 (H) 1.7 - 7.7 K/uL   Lymphocytes Relative 17 %   Lymphs Abs 2.3 0.7 - 4.0 K/uL   Monocytes Relative 8 %   Monocytes Absolute 1.1 (H) 0.1 - 1.0 K/uL   Eosinophils Relative 3 %   Eosinophils Absolute 0.4 0.0 - 0.5 K/uL   Basophils Relative 1 %   Basophils Absolute 0.1 0.0 - 0.1 K/uL   Immature Granulocytes 1 %   Abs Immature Granulocytes 0.16 (H) 0.00 - 0.07 K/uL    Comment: Performed at [facility], [place] [address] [place], [place] [address]  Glucose, capillary     Status: Abnormal   Collection Time: 03/01/22  7:40 AM  Result Value Ref Range   Glucose-Capillary 245 (H) 70 - 99 mg/dL    Comment: Glucose reference range applies only to samples taken after fasting for at least 8 hours.  Glucose, capillary     Status: Abnormal   Collection Time: 03/01/22 12:14 PM  Result Value Ref Range   Glucose-Capillary 129 (H) 70 - 99 mg/dL    Comment: Glucose  reference range applies only to samples taken after fasting for at least 8 hours.   *Note: Due to a large number of results and/or encounters for the requested time period, some results have not been displayed. A complete set of results can be found in Results Review.    Current Facility-Administered Medications  Medication Dose Route Frequency Provider Last Rate Last Admin   acetaminophen (TYLENOL) tablet 1,000 mg  1,000 mg Oral Q6H PRN Hosie Poisson, MD   1,000 mg at 03/01/22 1352   allopurinol (ZYLOPRIM) tablet 100 mg  100 mg Oral Daily Hosie Poisson, MD   100 mg at 03/01/22 0912   amLODipine (NORVASC) tablet 10 mg  10 mg Oral Daily Hosie Poisson, MD   10 mg at 03/01/22 0912   apixaban (ELIQUIS) tablet 5 mg  5 mg Oral BID Hosie Poisson, MD   5 mg at 03/01/22 0912   divalproex (DEPAKOTE) DR tablet 250 mg  250 mg Oral Q12H Hosie Poisson, MD   250 mg at 03/01/22 0912   furosemide (LASIX) tablet 20 mg  20 mg Oral Daily Nita Sells, MD   20 mg at 03/01/22 [id number]   haloperidol (HALDOL) tablet 2 mg  2 mg Oral BID Suella Broad, FNP       Or   haloperidol lactate (HALDOL) injection 2 mg  2 mg Intravenous BID Suella Broad, FNP   2 mg at 02/28/22 2207   hydrALAZINE (APRESOLINE) tablet 25 mg  25 mg Oral Q8H PRN Hosie Poisson, MD   25 mg at 02/26/22 2057   insulin aspart (novoLOG) injection 0-9 Units  0-9 Units Subcutaneous TID WC Hosie Poisson, MD   1 Units at 03/01/22 1352   ipratropium-albuterol (DUONEB) 0.5-2.5 (3) MG/3ML nebulizer solution 3 mL  3 mL Nebulization Q6H PRN Hosie Poisson, MD   3 mL at 02/28/22 2253   multivitamin with minerals tablet 1 tablet  1 tablet Oral Daily Hosie Poisson, MD   1 tablet at 03/01/22 0911   polyvinyl alcohol (LIQUIFILM TEARS) 1.4 % ophthalmic solution 1 drop  1 drop Both Eyes PRN Nita Sells, MD       pravastatin (PRAVACHOL) tablet 40 mg  40 mg Oral QPM Hosie Poisson, MD   40 mg at 02/28/22 1730   senna ([place])  tablet 8.6 mg  1  tablet Oral Daily PRN Hosie Poisson, MD   8.6 mg at 02/26/22 0043   sodium bicarbonate tablet 1,300 mg  1,300 mg Oral BID Hosie Poisson, MD   1,300 mg at 03/01/22 0911   umeclidinium-vilanterol (ANORO ELLIPTA) 62.5-25 MCG/ACT 1 puff  1 puff Inhalation Daily Hosie Poisson, MD   1 puff at 03/01/22 0845    Musculoskeletal: Strength & Muscle Tone: decreased Gait & Station:  Unable to assess patient lying in bed Patient leans: N/A            Psychiatric Specialty Exam:  Presentation  General Appearance:  Appropriate for Environment  Eye Contact: Fair  Speech: Clear and Coherent; Normal Rate  Speech Volume: Normal  Handedness: Right   Mood and Affect  Mood: Euthymic; Euphoric  Affect: Appropriate; Blunt   Thought Process  Thought Processes: Coherent; Goal Directed  Descriptions of Associations:Intact  Orientation:Full (Time, Place and Person)  Thought Content:Logical  History of Schizophrenia/Schizoaffective disorder:Yes  Duration of Psychotic Symptoms:N/A  Hallucinations:Hallucinations: None  Ideas of Reference:None  Suicidal Thoughts:Suicidal Thoughts: No  Homicidal Thoughts:Homicidal Thoughts: No   Sensorium  Memory: Immediate Fair; Recent Good; Remote Fair  Judgment: Fair  Insight: Fair   Community education officer  Concentration: Fair  Attention Span: Fair  Recall: [employer] of Knowledge: Fair  Language: Fair   Psychomotor Activity  Psychomotor Activity: Psychomotor Activity: Normal   Assets  Assets: Desire for Improvement; Physical Health; Communication Skills; Social Support   Sleep  Sleep: Sleep: Fair   Physical Exam: Physical Exam Vitals and nursing note reviewed.  Constitutional:      Appearance: She is well-developed and normal weight.  HENT:     Head: Normocephalic.  Skin:    Capillary Refill: Capillary refill takes less than 2 seconds.  Neurological:     General: No focal deficit present.      Mental Status: She is alert and oriented to person, place, and time.  Psychiatric:        Mood and Affect: Mood normal.        Behavior: Behavior normal.    Review of Systems  Psychiatric/Behavioral: Negative.    All other systems reviewed and are negative.  Blood pressure (!) 156/54, pulse 69, temperature 98.7 F (37.1 C), temperature source Oral, resp. rate 20, height 5' 6"$  (1.676 m), weight 123.8 kg, SpO2 99 %. Body mass index is 44.05 kg/m.  Treatment Plan Summary: Daily contact with patient to assess and evaluate symptoms and progress in treatment, Medication management, and Plan \Will hold antipsychotics at this time due to prolonged QTc of 533.  Although patient does appear to be responsive to current regimen as evidenced by reduction in behaviors, reduction in delusions and paranoia, improvement in current clinical presentation and reports from family.  Family reports modest improvement since starting psychotropic medication, and would like to continue current medications at this time. Will continue Haldol 2 mg p.o. twice daily for management of psychosis and new onset hallucinations.  -Will obtain baseline EKG and compare to new EKG obtained today 2/15. There is a allergy/contraindication for Haldol, however patient has recently received Haldol during his hospital stay appeared to tolerate well.  There appears to be no evidence of anaphylaxis, shortness of breath, hives/urticaria, itching, muscle rigidity, dizziness and or worsening nausea and vomiting. Patient and son are both aware of risks, benefits, side effects profile and documented history of allergy, and agree to ongoing treatment.   -Will continue Depakote 250 mg  p.o. twice daily to further target agitation and psychosis.  Will place valproic acid level for Friday, February 16.  -Continue ongoing support for appropriate decision making by patient and son to include MRI to further assess for dementia, stroke, and worsening  cognitive impairment.  Son is concerned about recent CT scan results, this information has not been deferred to primary team.    -Consider TOC consult for skilled nursing facility for rehabilitation.  -Will also need outpatient neurology consult.  If patient's symptoms continue to persist during hospitalization, may benefit from mocha/slums once medically stable.  Disposition: No evidence of imminent risk to self or others at present.   Patient does not meet criteria for psychiatric inpatient admission. Supportive therapy provided about ongoing stressors. Discussed crisis plan, support from social network, calling 911, coming to the Emergency Department, and calling Suicide Hotline.  Suella Broad, FNP 03/01/2022 3:49 PM

## 2022-03-01 NOTE — Discharge Instructions (Addendum)
Dear Abigail Wallace,  Thank you for letting us participate in your care. You were hospitalized for ** and diagnosed with Pulmonary edema. You were treated with **.   POST-HOSPITAL & CARE INSTRUCTIONS You have an appointment with the [facility] on Mar 22 at 9:10 am Go to your follow up appointments (listed below)   DOCTOR'S APPOINTMENT   Future Appointments  Date Time Provider [place]  03/02/2022  3:50 PM Sharion Settler, DO [facility] [place]  03/15/2022  1:55 PM [place] [facility]  04/02/2022  7:10 AM [facility] DEVICE REMOTES [facility]  04/06/2022  9:10 AM Martyn Malay, MD [facility] [place]  06/01/2022  3:00 PM GI-BCG DX DEXA 1 GI-BCGDG [facility]  07/02/2022  7:05 AM [facility] DEVICE REMOTES [facility]  10/01/2022  7:05 AM [facility] DEVICE REMOTES [facility]  12/31/2022  7:05 AM [facility] DEVICE REMOTES [facility]  04/01/2023  7:05 AM [facility] DEVICE REMOTES [facility]  07/01/2023  7:05 AM [facility] DEVICE REMOTES [facility]     Take care and be well!  [facility]  [place], [place] [address] [phone]

## 2022-03-01 NOTE — Progress Notes (Signed)
PROGRESS NOTE   Abigail Wallace  [id number] DOB: 01-21-1945 DOA: 02/25/2022 PCP: Martyn Malay, MD this is a FMTS patient    Brief Narrative:   77 year old white female (wheelchair dependent) known schizophrenia with tactile hallucinations complicated by dysphagia (refuses scope) Complete heart block 2011 status post pacemaker, atrial fibrillation CHADVASC >4 on Eliquis Nonobstructive cath 08/2010 Prior tobacco with likely underlying COPD, HLD, DM TY 2 CKD 3B Psychosis status post hospitalizations at behavioral hospitals in [place] in the past Documentation from teaching service on 2/8 shows that she was started on Lasix by her nephrologist within the past month and had been treated with a 10-day course of Cipro Brought to emergency room secondary to lower extremity swelling unable to stand for several days choking a lot more than normal low-grade fevers  BNP 690 troponin 18 WBC 12,000 COVID-negative, ammonia level 11 CXR = CHF Refused CT head for workup of stroke 2/12 = echocardiogram = severe asymmetric LVH, 2/13 psychiatry consulted, palliative care consulted 2/14 CT head shows 5 mm Meningioma=--OP follow up needed with NS    Hospital-Problem based course  Decompensated HFrEF-->HFpEF 55% today -Drop bid--> Lasix 40 IV  x 1 and then switch to home dose of Lasix 20 in a.m. given acute rise in creatinine  - Metoprolol 50 twice daily held 2/2 acute decompensation, resuming if kidney function is improved - Weight went from 129-123. - Losartan 5 held on admission -Continues on amlodipine 10 --hydralazine 25 every 8 as needed systolic blood pressure [id number]  Acute urinary retention - Retaining over 300 cc of urine, worsening AKI again on 2/15 - Needs indwelling Foley will need to have this removed in the outpatient setting.  Rehab stay at urology office  Acute superimposed on toxic metabolic encephalopathy on admission - Intermittently confused likely worsened by  schizophrenia -She will need neurocognitive testing but I do think she has elements of dementia superimposed on her schizophrenia  Underlying schizophrenia - Continue Depakote 250 twice daily, follow valproic acid level scheduled for 2/15 - Continue Haldol 2 mg twice daily  Sick sinus syndrome with pacemaker since 2011, CHADVASC >4 on Eliquis Prolonged QTc of 535 on 02/27/2022 -Metoprolol held as above -Watch QTc  -Continue Eliquis 5 twice daily  Presumed COPD with prior tobacco  DM TY 2 underlying CKD 3B -CBGs 135-245 - Sliding scale alone at this time, Tradjenta 5 daily on hold  Sacral decubitus ulcers -Not examined today, see below  Meningioma 5 mm to brain -Leukocytosis seems chronic -Someone performed a sed rate in 2023---had lung nodules based on 05/05/21  [lung rads 2] -get CTA head to r/o CVA  Lung Rads-2 -needs Rpt CT chest ~04/2022 - Chest x-ray 02/28/2022 showed only cardiomegaly  Leukocytosis -?  Low-grade underlying Aspiration? Skin maceraiton  -Dysphagia 3 diet as per SLP eval 02/26/2022  Mild underlying dementia -See above   DVT prophylaxis: apixaban 5 bid Code Status: Full code Family Communication: None present at bedside Disposition:  Status is: Inpatient Remains inpatient appropriate because:   Likely if labs stable in am and no further issues with leukocytosis can d/c soon     Subjective:  Awake coherent--quite tangential today Son at the bedside Has foley now as seems to have been retaining this am No cp fever chills n/v  Objective: Vitals:   03/01/22 0500 03/01/22 0622 03/01/22 0845 03/01/22 1325  BP:  (!) 167/52  (!) 156/54  Pulse:  (!) 59  69  Resp:  20  20  Temp:  98.7 F (37.1 C)  98.7 F (37.1 C)  TempSrc:  Oral  Oral  SpO2:  93% 94% 99%  Weight: 123.8 kg     Height:        Intake/Output Summary (Last 24 hours) at 03/01/2022 1703 Last data filed at 03/01/2022 1356 Gross per 24 hour  Intake 240 ml  Output 500 ml  Net -260  ml    Filed Weights   02/27/22 0420 02/28/22 0415 03/01/22 0500  Weight: 128.5 kg 128 kg 123.8 kg    Examination:  EOMI NCAT no focal deficit neck soft supple no icterus no pallor Mallampati 4 no wheeze no rales no rhonchi Abdomen is distended she does seem to have a little bit of tenderness Foley in place Lower extremities are soft no ulceration no wound I did not examine sacrum  Data Reviewed: personally reviewed   CBC    Component Value Date/Time   WBC 13.5 (H) 03/01/2022 0454   RBC 3.70 (L) 03/01/2022 0454   HGB 9.8 (L) 03/01/2022 0454   HGB 10.6 (L) 02/22/2022 1659   HCT 33.4 (L) 03/01/2022 0454   HCT 33.1 (L) 02/22/2022 1659   PLT 341 03/01/2022 0454   PLT 294 02/22/2022 1659   MCV 90.3 03/01/2022 0454   MCV 84 02/22/2022 1659   MCH 26.5 03/01/2022 0454   MCHC 29.3 (L) 03/01/2022 0454   RDW 16.2 (H) 03/01/2022 0454   RDW 15.4 02/22/2022 1659   LYMPHSABS 2.3 03/01/2022 0454   LYMPHSABS 3.7 (H) 06/21/2021 1416   MONOABS 1.1 (H) 03/01/2022 0454   EOSABS 0.4 03/01/2022 0454   EOSABS 0.2 06/21/2021 1416   BASOSABS 0.1 03/01/2022 0454   BASOSABS 0.1 06/21/2021 1416      Latest Ref Rng & Units 03/01/2022    4:54 AM 02/28/2022    5:03 AM 02/27/2022    4:51 AM  CMP  Glucose 70 - 99 mg/dL 149  155  134   BUN 8 - 23 mg/dL 37  32  30   Creatinine 0.44 - 1.00 mg/dL 2.73  2.17  1.47   Sodium 135 - 145 mmol/L 139  140  137   Potassium 3.5 - 5.1 mmol/L 4.0  4.1  3.9   Chloride 98 - 111 mmol/L 103  104  108   CO2 22 - 32 mmol/L 24  25  21   $ Calcium 8.9 - 10.3 mg/dL 8.9  9.2  8.9      Radiology Studies: CT HEAD WO CONTRAST (5MM)  Result Date: 02/28/2022 CLINICAL DATA:  Provided history: Stroke, follow-up. EXAM: CT HEAD WITHOUT CONTRAST TECHNIQUE: Contiguous axial images were obtained from the base of the skull through the vertex without intravenous contrast. RADIATION DOSE REDUCTION: This exam was performed according to the departmental dose-optimization program which  includes automated exposure control, adjustment of the mA and/or kV according to patient size and/or use of iterative reconstruction technique. COMPARISON:  Head CT 12/30/2021. FINDINGS: Brain: No age advanced or lobar predominant parenchymal atrophy. Redemonstrated chronic lacunar infarct within the left thalamus. 5 mm dural-based mass overlying the mid-to-anterior left frontal lobe compatible with a small meningioma (for instance as seen on series 5, image 29). No mass effect upon the underlying brain parenchyma. There is no acute intracranial hemorrhage. No demarcated cortical infarct. No extra-axial fluid collection. No midline shift. Vascular: No hyperdense vessel. Atherosclerotic calcifications. Skull: No fracture or aggressive osseous lesion. Sinuses/Orbits: No mass or acute finding within the imaged orbits. No significant paranasal sinus disease. IMPRESSION:  1. No evidence of acute intracranial hemorrhage or acute infarct. 2. 5 mm dural-based mass overlying the mid-to-anterior left frontal lobe compatible with a small meningioma. No mass effect upon the underlying brain parenchyma. 3. Redemonstrated chronic lacunar infarct within the left thalamus. Electronically Signed   By: Kellie Simmering D.O.   On: 02/28/2022 15:15   DG CHEST PORT 1 VIEW  Result Date: 02/28/2022 CLINICAL DATA:  Pneumonia EXAM: PORTABLE CHEST 1 VIEW COMPARISON:  02/25/2022 FINDINGS: Cardiomegaly. No frank interstitial edema. No pleural effusion or pneumothorax. Left subclavian pacemaker. IMPRESSION: Cardiomegaly.  No frank interstitial edema. Electronically Signed   By: Julian Hy M.D.   On: 02/28/2022 08:23     Scheduled Meds:  allopurinol  100 mg Oral Daily   amLODipine  10 mg Oral Daily   apixaban  5 mg Oral BID   divalproex  250 mg Oral Q12H   furosemide  20 mg Oral Daily   haloperidol  2 mg Oral BID   Or   haloperidol lactate  2 mg Intravenous BID   insulin aspart  0-9 Units Subcutaneous TID WC   multivitamin  with minerals  1 tablet Oral Daily   pravastatin  40 mg Oral QPM   sodium bicarbonate  1,300 mg Oral BID   umeclidinium-vilanterol  1 puff Inhalation Daily   Continuous Infusions:   LOS: 4 days   Time spent: [place], MD [facility] To contact the attending provider between 7A-7P or the covering provider during after hours 7P-7A, please log into the web site [email or web address] and access using universal [place] password for that web site. If you do not have the password, please call the hospital operator.  03/01/2022, 5:03 PM

## 2022-03-01 NOTE — Progress Notes (Signed)
Occupational Therapy Treatment Patient Details Name: Abigail Wallace MRN: [id number] DOB: [birth year] Today's Date: 03/01/2022   History of present illness Patient is a 77 year old female who presented with slurred speech, shortness of breath and confusion. Patient was admitted with acute on chronic systolic and diastolic heart failure, acute metabolic encephalopathy, and elevated troponin. PMH: schizophrenia, CHF, HTN, CKD IV, COPD, A fib, DM II, infrarenal AAA, hernia   OT comments  Patient max assist for supine to sit and mod assist for stand with +2 assistance. Able to stand in order to get Depends pulled up while using two hand holds. Patient able to transfer to wheelchair. She is still fearful but pleasant and agreeable today.   Recommendations for follow up therapy are one component of a multi-disciplinary discharge planning process, led by the attending physician.  Recommendations may be updated based on patient status, additional functional criteria and insurance authorization.    Follow Up Recommendations  Skilled nursing-short term rehab (<3 hours/day)     Assistance Recommended at Discharge Frequent or constant Supervision/Assistance  Patient can return home with the following  A lot of help with walking and/or transfers;A lot of help with bathing/dressing/bathroom;Assistance with cooking/housework;Assist for transportation;Direct supervision/assist for financial management;Help with stairs or ramp for entrance   Equipment Recommendations  None recommended by OT    Recommendations for Other Services      Precautions / Restrictions Precautions Precautions: Fall Restrictions Weight Bearing Restrictions: No          Balance Overall balance assessment: Needs assistance Sitting-balance support: No upper extremity supported, Feet supported Sitting balance-Leahy Scale: Fair                                     ADL either performed or assessed with clinical  judgement   ADL Overall ADL's : Needs assistance/impaired                     Lower Body Dressing: Total assistance;Sit to/from stand;+2 for physical assistance Lower Body Dressing Details (indicate cue type and reason): required total assist to don Depends - two assist to manage donnign clothing item and steadying patient in standing. Assist on both sides for pulling depends up.               General ADL Comments: max assist to transfer into sitting at edge of bed - a lot o assist for trunk negotiation. Patient mod assist with +2 to stand from bed holding onto back of BSC. Able to stand to get Depends on. And then stand again and pivot to recliner with min assist.    Extremity/Trunk Assessment Upper Extremity Assessment Upper Extremity Assessment: Overall WFL for tasks assessed   Lower Extremity Assessment Lower Extremity Assessment: Defer to PT evaluation   Cervical / Trunk Assessment Cervical / Trunk Assessment:  (bariatric)    Vision Patient Visual Report: No change from baseline     Perception     Praxis      Cognition Arousal/Alertness: Awake/alert Behavior During Therapy: Anxious Overall Cognitive Status: Within Functional Limits for tasks assessed                                 General Comments: anxious about getting up and falling but pleasant and agreeble  Pertinent Vitals/ Pain       Pain Assessment Pain Assessment: No/denies pain   Frequency  Min 2X/week        Progress Toward Goals  OT Goals(current goals can now be found in the care plan section)  Progress towards OT goals: Progressing toward goals  Acute Rehab OT Goals Patient Stated Goal: to be able to go home eventually OT Goal Formulation: With family Time For Goal Achievement: 03/12/22 Potential to Achieve Goals: [place] Discharge plan remains appropriate    Co-evaluation                 AM-PAC OT "6 Clicks" Daily Activity      Outcome Measure   Help from another person eating meals?: A Little Help from another person taking care of personal grooming?: A Little Help from another person toileting, which includes using toliet, bedpan, or urinal?: A Lot Help from another person bathing (including washing, rinsing, drying)?: A Lot Help from another person to put on and taking off regular upper body clothing?: A Lot Help from another person to put on and taking off regular lower body clothing?: Total 6 Click Score: 13    End of Session    OT Visit Diagnosis: Unsteadiness on feet (R26.81);Other abnormalities of gait and mobility (R26.89);Other symptoms and signs involving cognitive function   Activity Tolerance Patient tolerated treatment well   Patient Left in bed;with call bell/phone within reach;with bed alarm set;with nursing/sitter in room   Nurse Communication Mobility status        Time: [id number] OT Time Calculation (min): 14 min  Charges: OT General Charges $OT Visit: 1 Visit OT Treatments $Self Care/Home Management : 8-22 mins  Gustavo Lah, OTR/L [place]  Office [phone]   Lenward Chancellor 03/01/2022, 12:55 PM

## 2022-03-01 NOTE — Progress Notes (Addendum)
PROGRESS NOTE   Abigail Wallace  [id number] DOB: 05-Jun-1945 DOA: 02/25/2022 PCP: Martyn Malay, MD this is a FMTS patient    Brief Narrative:   77 year old white female (wheelchair dependent) known schizophrenia with tactile hallucinations complicated by dysphagia (refuses scope) Complete heart block 2011 status post pacemaker, atrial fibrillation CHADVASC >4 on Eliquis Nonobstructive cath 08/2010 Prior tobacco with likely underlying COPD, HLD, DM TY 2 CKD 3B Psychosis status post hospitalizations at behavioral hospitals in [place] in the past Documentation from teaching service on 2/8 shows that she was started on Lasix by her nephrologist within the past month and had been treated with a 10-day course of Cipro Brought to emergency room secondary to lower extremity swelling unable to stand for several days choking a lot more than normal low-grade fevers  BNP 690 troponin 18 WBC 12,000 COVID-negative, ammonia level 11 CXR = CHF Refused CT head for workup of stroke 2/12 = echocardiogram = severe asymmetric LVH, 2/13 psychiatry consulted, palliative care consulted 2/14 CT head shows 5 mm Meningioma=--OP follow up    Hospital-Problem based course  Decompensated HFrEF-->HFpEF 55% today -Drop bid--> Lasix 40 IV  x 1 and then switch to home dose of Lasix 20 in a.m. given acute rise in creatinine  - Metoprolol 50 twice daily held 2/2 acute decompensation, resuming if kidney function is improved - Weight went from 1 29-1 23. - Losartan 5 held on admission -Continues on amlodipine 10 --hydralazine 25 every 8 as needed systolic blood pressure [id number]  Acute urinary retention - Retaining over 300 cc of urine, worsening AKI again on 2/15 - Needs indwelling Foley will need to have this removed in the outpatient setting.  Rehab stay at urology office  Acute superimposed on toxic metabolic encephalopathy on admission - Intermittently confused likely worsened by schizophrenia -She  will need neurocognitive testing but I do think she has elements of dementia superimposed on her schizophrenia  Underlying schizophrenia - Continue Depakote 250 twice daily, follow valproic acid level scheduled for 2/15 - Continue Haldol 2 mg twice daily  Sick sinus syndrome with pacemaker since 2011, CHADVASC >4 on Eliquis Prolonged QTc of 535 on 02/27/2022 -Metoprolol held as above -Watch QTc  -Continue Eliquis 5 twice daily  Presumed COPD with prior tobacco  DM TY 2 underlying CKD 3B -CBGs 120--254 - Sliding scale alone at this time, Tradjenta 5 daily on hold  Sacral decubitus ulcers -Not examined today, see below  Meningioma 5 mm to brain -Leukocytosis seems chronic -Someone performed a sed rate in 2023---had lung nodules based on 05/05/21  [lung rads 2] -think meningioma should be worked up as OP  Lung Rads-2 -needs Rpt CT chest ~04/2022 - Chest x-ray 02/28/2022 showed only cardiomegaly  Leukocytosis -?  Low-grade underlying Aspiration? Skin maceraiton  -Dysphagia 3 diet as per SLP eval 02/26/2022  Mild underlying dementia -See above   DVT prophylaxis: apixaban 5 bid Code Status: Full code Family Communication: None present at bedside Disposition:  Status is: Inpatient Remains inpatient appropriate because:   Likely if labs stable in am and no further issues with leukocytosis can d/c soon     Subjective:  Awake coherent--quite tangential today Son at the bedside Has foley now as seems to have been retaining this am No cp fever chills n/v  Objective: Vitals:   03/01/22 0500 03/01/22 0622 03/01/22 0845 03/01/22 1325  BP:  (!) 167/52  (!) 156/54  Pulse:  (!) 59  69  Resp:  20  20  Temp:  98.7 F (37.1 C)  98.7 F (37.1 C)  TempSrc:  Oral  Oral  SpO2:  93% 94% 99%  Weight: 123.8 kg     Height:        Intake/Output Summary (Last 24 hours) at 03/01/2022 1544 Last data filed at 03/01/2022 1356 Gross per 24 hour  Intake 240 ml  Output 500 ml  Net -260  ml    Filed Weights   02/27/22 0420 02/28/22 0415 03/01/22 0500  Weight: 128.5 kg 128 kg 123.8 kg    Examination:  EOMI NCAT no focal deficit neck soft supple no icterus no pallor Mallampati 4 no wheeze no rales no rhonchi Abdomen is distended she does seem to have a little bit of tenderness Foley in place Lower extremities are soft no ulceration no wound I did not examine sacrum  Data Reviewed: personally reviewed   CBC    Component Value Date/Time   WBC 13.5 (H) 03/01/2022 0454   RBC 3.70 (L) 03/01/2022 0454   HGB 9.8 (L) 03/01/2022 0454   HGB 10.6 (L) 02/22/2022 1659   HCT 33.4 (L) 03/01/2022 0454   HCT 33.1 (L) 02/22/2022 1659   PLT 341 03/01/2022 0454   PLT 294 02/22/2022 1659   MCV 90.3 03/01/2022 0454   MCV 84 02/22/2022 1659   MCH 26.5 03/01/2022 0454   MCHC 29.3 (L) 03/01/2022 0454   RDW 16.2 (H) 03/01/2022 0454   RDW 15.4 02/22/2022 1659   LYMPHSABS 2.3 03/01/2022 0454   LYMPHSABS 3.7 (H) 06/21/2021 1416   MONOABS 1.1 (H) 03/01/2022 0454   EOSABS 0.4 03/01/2022 0454   EOSABS 0.2 06/21/2021 1416   BASOSABS 0.1 03/01/2022 0454   BASOSABS 0.1 06/21/2021 1416      Latest Ref Rng & Units 03/01/2022    4:54 AM 02/28/2022    5:03 AM 02/27/2022    4:51 AM  CMP  Glucose 70 - 99 mg/dL 149  155  134   BUN 8 - 23 mg/dL 37  32  30   Creatinine 0.44 - 1.00 mg/dL 2.73  2.17  1.47   Sodium 135 - 145 mmol/L 139  140  137   Potassium 3.5 - 5.1 mmol/L 4.0  4.1  3.9   Chloride 98 - 111 mmol/L 103  104  108   CO2 22 - 32 mmol/L 24  25  21   $ Calcium 8.9 - 10.3 mg/dL 8.9  9.2  8.9      Radiology Studies: CT HEAD WO CONTRAST (5MM)  Result Date: 02/28/2022 CLINICAL DATA:  Provided history: Stroke, follow-up. EXAM: CT HEAD WITHOUT CONTRAST TECHNIQUE: Contiguous axial images were obtained from the base of the skull through the vertex without intravenous contrast. RADIATION DOSE REDUCTION: This exam was performed according to the departmental dose-optimization program which  includes automated exposure control, adjustment of the mA and/or kV according to patient size and/or use of iterative reconstruction technique. COMPARISON:  Head CT 12/30/2021. FINDINGS: Brain: No age advanced or lobar predominant parenchymal atrophy. Redemonstrated chronic lacunar infarct within the left thalamus. 5 mm dural-based mass overlying the mid-to-anterior left frontal lobe compatible with a small meningioma (for instance as seen on series 5, image 29). No mass effect upon the underlying brain parenchyma. There is no acute intracranial hemorrhage. No demarcated cortical infarct. No extra-axial fluid collection. No midline shift. Vascular: No hyperdense vessel. Atherosclerotic calcifications. Skull: No fracture or aggressive osseous lesion. Sinuses/Orbits: No mass or acute finding within the imaged orbits. No significant paranasal sinus disease. IMPRESSION:  1. No evidence of acute intracranial hemorrhage or acute infarct. 2. 5 mm dural-based mass overlying the mid-to-anterior left frontal lobe compatible with a small meningioma. No mass effect upon the underlying brain parenchyma. 3. Redemonstrated chronic lacunar infarct within the left thalamus. Electronically Signed   By: Kellie Simmering D.O.   On: 02/28/2022 15:15   DG CHEST PORT 1 VIEW  Result Date: 02/28/2022 CLINICAL DATA:  Pneumonia EXAM: PORTABLE CHEST 1 VIEW COMPARISON:  02/25/2022 FINDINGS: Cardiomegaly. No frank interstitial edema. No pleural effusion or pneumothorax. Left subclavian pacemaker. IMPRESSION: Cardiomegaly.  No frank interstitial edema. Electronically Signed   By: Julian Hy M.D.   On: 02/28/2022 08:23     Scheduled Meds:  allopurinol  100 mg Oral Daily   amLODipine  10 mg Oral Daily   apixaban  5 mg Oral BID   divalproex  250 mg Oral Q12H   furosemide  20 mg Oral Daily   haloperidol  2 mg Oral BID   Or   haloperidol lactate  2 mg Intravenous BID   insulin aspart  0-9 Units Subcutaneous TID WC   multivitamin  with minerals  1 tablet Oral Daily   pravastatin  40 mg Oral QPM   sodium bicarbonate  1,300 mg Oral BID   umeclidinium-vilanterol  1 puff Inhalation Daily   Continuous Infusions:   LOS: 4 days   Time spent: [place], MD [facility] To contact the attending provider between 7A-7P or the covering provider during after hours 7P-7A, please log into the web site [email or web address] and access using universal [place] password for that web site. If you do not have the password, please call the hospital operator.  03/01/2022, 3:44 PM

## 2022-03-01 NOTE — Progress Notes (Signed)
Stat MRI brain ordered on this pt. Looking through pt chart she has a Pacemaker. Upon research, original pacemaker system was implanted in 2007. However, pt had a generator change 08/2019 to a [employer] Adapta which is MRI UNSAFE. Secure chat to RN and ordering MD to alert them that MRI will not be able to be done. MD stated he understood and would change order to CT.

## 2022-03-01 NOTE — Progress Notes (Signed)
Patient refused CPAP qhs.  Patient encouraged to contact RT should she change her mind.

## 2022-03-02 ENCOUNTER — Encounter (HOSPITAL_COMMUNITY): Payer: Self-pay | Admitting: Internal Medicine

## 2022-03-02 ENCOUNTER — Ambulatory Visit: Payer: 59 | Admitting: Family Medicine

## 2022-03-02 DIAGNOSIS — J810 Acute pulmonary edema: Secondary | ICD-10-CM | POA: Diagnosis not present

## 2022-03-02 LAB — CBC WITH DIFFERENTIAL/PLATELET
Abs Immature Granulocytes: 0.17 10*3/uL — ABNORMAL HIGH (ref 0.00–0.07)
Basophils Absolute: 0.1 10*3/uL (ref 0.0–0.1)
Basophils Relative: 1 %
Eosinophils Absolute: 0.6 10*3/uL — ABNORMAL HIGH (ref 0.0–0.5)
Eosinophils Relative: 5 %
HCT: 32.8 % — ABNORMAL LOW (ref 36.0–46.0)
Hemoglobin: 9.7 g/dL — ABNORMAL LOW (ref 12.0–15.0)
Immature Granulocytes: 1 %
Lymphocytes Relative: 24 %
Lymphs Abs: 2.9 10*3/uL (ref 0.7–4.0)
MCH: 26.6 pg (ref 26.0–34.0)
MCHC: 29.6 g/dL — ABNORMAL LOW (ref 30.0–36.0)
MCV: 90.1 fL (ref 80.0–100.0)
Monocytes Absolute: 0.9 10*3/uL (ref 0.1–1.0)
Monocytes Relative: 7 %
Neutro Abs: 7.8 10*3/uL — ABNORMAL HIGH (ref 1.7–7.7)
Neutrophils Relative %: 62 %
Platelets: 366 10*3/uL (ref 150–400)
RBC: 3.64 MIL/uL — ABNORMAL LOW (ref 3.87–5.11)
RDW: 15.9 % — ABNORMAL HIGH (ref 11.5–15.5)
WBC: 12.3 10*3/uL — ABNORMAL HIGH (ref 4.0–10.5)
nRBC: 0 % (ref 0.0–0.2)

## 2022-03-02 LAB — BASIC METABOLIC PANEL
Anion gap: 10 (ref 5–15)
BUN: 39 mg/dL — ABNORMAL HIGH (ref 8–23)
CO2: 24 mmol/L (ref 22–32)
Calcium: 8.7 mg/dL — ABNORMAL LOW (ref 8.9–10.3)
Chloride: 102 mmol/L (ref 98–111)
Creatinine, Ser: 2.62 mg/dL — ABNORMAL HIGH (ref 0.44–1.00)
GFR, Estimated: 18 mL/min — ABNORMAL LOW (ref >60)
Glucose, Bld: 178 mg/dL — ABNORMAL HIGH (ref 70–99)
Potassium: 4.5 mmol/L (ref 3.5–5.1)
Sodium: 136 mmol/L (ref 135–145)

## 2022-03-02 LAB — VALPROIC ACID LEVEL: Valproic Acid Lvl: 29 ug/mL — ABNORMAL LOW (ref 50.0–100.0)

## 2022-03-02 LAB — GLUCOSE, CAPILLARY
Glucose-Capillary: 193 mg/dL — ABNORMAL HIGH (ref 70–99)
Glucose-Capillary: 167 mg/dL — ABNORMAL HIGH (ref 70–99)
Glucose-Capillary: 164 mg/dL — ABNORMAL HIGH (ref 70–99)
Glucose-Capillary: 131 mg/dL — ABNORMAL HIGH (ref 70–99)

## 2022-03-02 MED ORDER — CHLORHEXIDINE GLUCONATE CLOTH 2 % EX PADS
6.0000 | MEDICATED_PAD | Freq: Every day | CUTANEOUS | Status: DC
  Administered 2022-03-02 – 2022-03-05 (×4): 6 via TOPICAL

## 2022-03-02 MED ORDER — METOPROLOL TARTRATE 25 MG PO TABS
25.0000 mg | ORAL_TABLET | Freq: Two times a day (BID) | ORAL | Status: DC
  Administered 2022-03-02 – 2022-03-05 (×7): 25 mg via ORAL
  Filled 2022-03-02 (×7): qty 1

## 2022-03-02 MED ORDER — DIVALPROEX SODIUM 250 MG PO DR TAB
375.0000 mg | DELAYED_RELEASE_TABLET | Freq: Two times a day (BID) | ORAL | Status: DC
  Administered 2022-03-02: 375 mg via ORAL
  Filled 2022-03-02: qty 1

## 2022-03-02 MED ORDER — DIVALPROEX SODIUM 250 MG PO DR TAB
250.0000 mg | DELAYED_RELEASE_TABLET | Freq: Two times a day (BID) | ORAL | Status: DC
  Administered 2022-03-02 – 2022-03-05 (×6): 250 mg via ORAL
  Filled 2022-03-02 (×6): qty 1

## 2022-03-02 NOTE — Progress Notes (Deleted)
    SUBJECTIVE:   CHIEF COMPLAINT / HPI:   Abigail Wallace is a 77 y.o. female who presents to the [facility] clinic today accompanied by her son Abigail Wallace to discuss the following concerns:   Bleeding Mentioned briefly at prior visit with me on 2/8. Son noted seeing bleeding in toilet intermittently. Was not sure if bleeding was of vaginal or rectal origin. Patient is not up to date on colon cancer screening but declines colonoscopy for evaluation.    PERTINENT  PMH / PSH: ***  OBJECTIVE:   There were no vitals taken for this visit.   General: NAD, pleasant, able to participate in exam Cardiac: RRR, no murmurs. Respiratory: CTAB, normal effort, No wheezes, rales or rhonchi Abdomen: Bowel sounds present, nontender, nondistended, no hepatosplenomegaly. Extremities: no edema or cyanosis. Skin: warm and dry, no rashes noted Neuro: alert, no obvious focal deficits Psych: Normal affect and mood  ASSESSMENT/PLAN:   No problem-specific Assessment & Plan notes found for this encounter.     Sharion Settler, [place]

## 2022-03-02 NOTE — TOC Progression Note (Addendum)
Transition of Care [facility]) - Progression Note    Patient Details  Name: Abigail Wallace MRN: [id number] Date of Birth: 1945/04/23  Transition of Care [facility]) CM/SW [place],  Phone Number: 03/02/2022, 1:22 PM  Clinical Narrative:     CSW called pt's son for SNF choice. No answer; left voicemail requesting return call.   1513: Called pt son again; no answer.   Expected Discharge Plan: [place] Barriers to Discharge: Continued Medical Work up  Expected Discharge Plan and Services   Discharge Planning Services: CM Consult   Living arrangements for the past 2 months: Single Family Home                                       Social Determinants of Health (SDOH) Interventions SDOH Screenings   Food Insecurity: No Food Insecurity (02/25/2022)  Housing: Low Risk  (02/25/2022)  Transportation Needs: No Transportation Needs (02/25/2022)  Utilities: Not At Risk (02/25/2022)  Alcohol Screen: Low Risk  (12/27/2020)  Depression (PHQ2-9): High Risk (02/22/2022)  Financial Resource Strain: Low Risk  (12/27/2020)  Physical Activity: Inactive (12/27/2020)  Social Connections: Socially Isolated (12/27/2020)  Stress: No Stress Concern Present (12/27/2020)  Tobacco Use: High Risk (03/02/2022)    Readmission Risk Interventions     No data to display

## 2022-03-02 NOTE — Progress Notes (Signed)
Patient continues to refuse CPAP qhs.  Patient encouraged to contact RT should she change her mind.

## 2022-03-02 NOTE — Progress Notes (Signed)
PROGRESS NOTE   Abigail Wallace  [id number] DOB: 07/13/1945 DOA: 02/25/2022 PCP: Martyn Malay, MD this is a FMTS patient    Brief Narrative:   77 year old white female (wheelchair dependent) known schizophrenia with tactile hallucinations complicated by dysphagia (refuses scope) Complete heart block 2011 status post pacemaker, atrial fibrillation CHADVASC >4 on Eliquis Nonobstructive cath 08/2010 Prior tobacco with likely underlying COPD, HLD, DM TY 2 CKD 3B Psychosis status post hospitalizations at behavioral hospitals in [place] in the past Documentation from teaching service on 2/8 shows that she was started on Lasix by her nephrologist within the past month and had been treated with a 10-day course of Cipro Brought to emergency room secondary to lower extremity swelling unable to stand for several days choking a lot more than normal low-grade fevers  BNP 690 troponin 18 WBC 12,000 COVID-negative, ammonia level 11 CXR = CHF Refused CT head for workup of stroke 2/12 = echocardiogram = severe asymmetric LVH, 2/13 psychiatry consulted, palliative care consulted 2/14 CT head shows 5 mm Meningioma=--OP follow up needed with NS--- she refused CT angiogram [cannot get MRI as pacemaker incompatible ]    Hospital-Problem based course  Decompensated HFrEF-->HFpEF 55% today --IV Lasix discontinued continue oral Lasix 20 only which is home dose -Give metoprolol at lower dose of 25 twice daily (home dose 50), continue amlodipine 10, hydralazine 25 5 every 8 blood pressure >170 - Weight 129-->124, I/O -2.9 Liters urin of urine out todaye  Acute urinary retention causing AKI this hospitalization - Needs indwelling Foley  as 2 episodes of retention will need to have this removed in the outpatient setting. -I have given the number of [facility] to the son -Patient will need to have an improvement in her creatinine down to at least 2.0 before we can plan on discharging  Acute  superimposed on toxic metabolic encephalopathy on admission - Intermittently confused likely worsened by schizophrenia -She will need neurocognitive testing -I do believe she does have some underlying dementia superimposed on her schizophrenia  Underlying schizophrenia - Continue Depakote 250 twice daily-- Depakote level 29 - Continue Haldol 2 mg twice daily - Defer to psychiatry  Sick sinus syndrome with pacemaker since 2011, CHADVASC >4 on Eliquis Prolonged QTc of 535 on 02/27/2022 -Watch QTc , reinitiating metoprolol as above -Continue Eliquis 5 twice daily  Presumed COPD with prior tobacco  DM TY 2 underlying CKD 3B -CBGs 178-190 - Sliding scale alone at this time, Tradjenta 5 daily on hold  Sacral decubitus ulcers -Not examined today, see below  Meningioma 5 mm to brain -Leukocytosis seems chronic -Someone performed a sed rate in 2023---had lung nodules based on 05/05/21  [lung rads 2] -Patient refused CTA head on 2/15 and would not reattempt as she does not want to after I speak to her  Lung Rads-2 -needs Rpt CT chest ~04/2022 - Chest x-ray 02/28/2022 showed only cardiomegaly  Leukocytosis -?  Low-grade underlying Aspiration? Skin maceraiton  -Dysphagia 3 diet as per SLP eval 02/26/2022  Mild underlying dementia -See above   DVT prophylaxis: apixaban 5 bid Code Status: Full code Family Communication: None present at bedside Disposition:  Status is: Inpatient Remains inpatient appropriate because:   Still has AKI and need to ensure that this is resolving prior to discharge with 1 more lab     Subjective:  "I want to go home" Son at bedside we discussed patient's AKI and how to prevent - He is understandably torn and seems to have some caregiver  burnout taking care of her for 12 years  Objective: Vitals:   03/01/22 2007 03/02/22 0500 03/02/22 0607 03/02/22 0812  BP: (!) 142/60  (!) 158/45   Pulse: 70  (!) 59   Resp: (!) 22  20   Temp: 98.4 F (36.9 C)   98.9 F (37.2 C)   TempSrc: Oral  Oral   SpO2: 96%  95% 96%  Weight:  124.7 kg    Height:        Intake/Output Summary (Last 24 hours) at 03/02/2022 1121 Last data filed at 03/02/2022 0600 Gross per 24 hour  Intake 660 ml  Output 1000 ml  Net -340 ml    Filed Weights   02/28/22 0415 03/01/22 0500 03/02/22 0500  Weight: 128 kg 123.8 kg 124.7 kg    Examination:  EOMI NCAT no focal deficit neck soft supple no icterus no pallor Mallampati 4 no wheeze no rales no rhonchi Abdomen is soft nontender no rebound Foley in place Lower extremities are soft with slight edema I did not examine heels or sacrum She remains on oxygen   Data Reviewed: personally reviewed   CBC    Component Value Date/Time   WBC 12.3 (H) 03/02/2022 0518   RBC 3.64 (L) 03/02/2022 0518   HGB 9.7 (L) 03/02/2022 0518   HGB 10.6 (L) 02/22/2022 1659   HCT 32.8 (L) 03/02/2022 0518   HCT 33.1 (L) 02/22/2022 1659   PLT 366 03/02/2022 0518   PLT 294 02/22/2022 1659   MCV 90.1 03/02/2022 0518   MCV 84 02/22/2022 1659   MCH 26.6 03/02/2022 0518   MCHC 29.6 (L) 03/02/2022 0518   RDW 15.9 (H) 03/02/2022 0518   RDW 15.4 02/22/2022 1659   LYMPHSABS 2.9 03/02/2022 0518   LYMPHSABS 3.7 (H) 06/21/2021 1416   MONOABS 0.9 03/02/2022 0518   EOSABS 0.6 (H) 03/02/2022 0518   EOSABS 0.2 06/21/2021 1416   BASOSABS 0.1 03/02/2022 0518   BASOSABS 0.1 06/21/2021 1416      Latest Ref Rng & Units 03/02/2022    5:18 AM 03/01/2022    4:54 AM 02/28/2022    5:03 AM  CMP  Glucose 70 - 99 mg/dL 178  149  155   BUN 8 - 23 mg/dL 39  37  32   Creatinine 0.44 - 1.00 mg/dL 2.62  2.73  2.17   Sodium 135 - 145 mmol/L 136  139  140   Potassium 3.5 - 5.1 mmol/L 4.5  4.0  4.1   Chloride 98 - 111 mmol/L 102  103  104   CO2 22 - 32 mmol/L 24  24  25   $ Calcium 8.9 - 10.3 mg/dL 8.7  8.9  9.2      Radiology Studies: CT HEAD WO CONTRAST (5MM)  Result Date: 02/28/2022 CLINICAL DATA:  Provided history: Stroke, follow-up. EXAM: CT HEAD  WITHOUT CONTRAST TECHNIQUE: Contiguous axial images were obtained from the base of the skull through the vertex without intravenous contrast. RADIATION DOSE REDUCTION: This exam was performed according to the departmental dose-optimization program which includes automated exposure control, adjustment of the mA and/or kV according to patient size and/or use of iterative reconstruction technique. COMPARISON:  Head CT 12/30/2021. FINDINGS: Brain: No age advanced or lobar predominant parenchymal atrophy. Redemonstrated chronic lacunar infarct within the left thalamus. 5 mm dural-based mass overlying the mid-to-anterior left frontal lobe compatible with a small meningioma (for instance as seen on series 5, image 29). No mass effect upon the underlying brain parenchyma. There  is no acute intracranial hemorrhage. No demarcated cortical infarct. No extra-axial fluid collection. No midline shift. Vascular: No hyperdense vessel. Atherosclerotic calcifications. Skull: No fracture or aggressive osseous lesion. Sinuses/Orbits: No mass or acute finding within the imaged orbits. No significant paranasal sinus disease. IMPRESSION: 1. No evidence of acute intracranial hemorrhage or acute infarct. 2. 5 mm dural-based mass overlying the mid-to-anterior left frontal lobe compatible with a small meningioma. No mass effect upon the underlying brain parenchyma. 3. Redemonstrated chronic lacunar infarct within the left thalamus. Electronically Signed   By: Kellie Simmering D.O.   On: 02/28/2022 15:15     Scheduled Meds:  allopurinol  100 mg Oral Daily   amLODipine  10 mg Oral Daily   apixaban  5 mg Oral BID   Chlorhexidine Gluconate Cloth  6 each Topical Daily   divalproex  375 mg Oral Q12H   furosemide  20 mg Oral Daily   haloperidol  2 mg Oral BID   Or   haloperidol lactate  2 mg Intravenous BID   insulin aspart  0-9 Units Subcutaneous TID WC   multivitamin with minerals  1 tablet Oral Daily   pravastatin  40 mg Oral QPM    sodium bicarbonate  1,300 mg Oral BID   umeclidinium-vilanterol  1 puff Inhalation Daily   Continuous Infusions:   LOS: 5 days   Time spent: [place], MD [facility] To contact the attending provider between 7A-7P or the covering provider during after hours 7P-7A, please log into the web site [email or web address] and access using universal [place] password for that web site. If you do not have the password, please call the hospital operator.  03/02/2022, 11:21 AM

## 2022-03-02 NOTE — Patient Instructions (Incomplete)
It was wonderful to see you today.  Please bring ALL of your medications with you to every visit.   Today we talked about:  **  Thank you for coming to your visit as scheduled. We have had a large "no-show" problem lately, and this significantly limits our ability to see and care for patients. As a friendly reminder- if you cannot make your appointment please call to cancel. We do have a no show policy for those who do not cancel within 24 hours. Our policy is that if you miss or fail to cancel an appointment within 24 hours, 3 times in a 6-month period, you may be dismissed from our clinic.   Thank you for choosing [facility].   Please call [phone] with any questions about today's appointment.  Please be sure to schedule follow up at the front  desk before you leave today.   Rateel Beldin, DO PGY-3 Family Medicine   

## 2022-03-02 NOTE — Consult Note (Signed)
  Psychiatry was contacted by pharmacist regarding Depakote level and recent adjustment from 250 BID -->375 BID. Out of concerns for kidney disease and length of time to reach steady state. Depakote dosing will remain the same at this time. Current Valproic acid level of 29. No adjustments will be made. Psychiatry consult service will continue to follow. Patient to be seen on Monday. Continue current medication regimen.

## 2022-03-02 NOTE — Progress Notes (Signed)
SATURATION QUALIFICATIONS: (This note is used to comply with regulatory documentation for home oxygen)  Patient Saturations on Room Air at Rest = 90%  Patient Saturations on Room Air while Ambulating = 86%  Patient Saturations on 2 Liters of oxygen while Ambulating = 92%  Please briefly explain why patient needs home oxygen:  Patients O2 sat decreased to 86% on RA when transferring in room to [facility]. Sats increase with 2L O2 Tradewind[place] applied to 92%

## 2022-03-03 DIAGNOSIS — J810 Acute pulmonary edema: Secondary | ICD-10-CM | POA: Diagnosis not present

## 2022-03-03 LAB — CBC WITH DIFFERENTIAL/PLATELET
Abs Immature Granulocytes: 0.14 10*3/uL — ABNORMAL HIGH (ref 0.00–0.07)
Basophils Absolute: 0.1 10*3/uL (ref 0.0–0.1)
Basophils Relative: 1 %
Eosinophils Absolute: 0.5 10*3/uL (ref 0.0–0.5)
Eosinophils Relative: 4 %
HCT: 31.6 % — ABNORMAL LOW (ref 36.0–46.0)
Hemoglobin: 9.3 g/dL — ABNORMAL LOW (ref 12.0–15.0)
Immature Granulocytes: 1 %
Lymphocytes Relative: 22 %
Lymphs Abs: 2.9 10*3/uL (ref 0.7–4.0)
MCH: 26.4 pg (ref 26.0–34.0)
MCHC: 29.4 g/dL — ABNORMAL LOW (ref 30.0–36.0)
MCV: 89.8 fL (ref 80.0–100.0)
Monocytes Absolute: 1 10*3/uL (ref 0.1–1.0)
Monocytes Relative: 8 %
Neutro Abs: 8.2 10*3/uL — ABNORMAL HIGH (ref 1.7–7.7)
Neutrophils Relative %: 64 %
Platelets: 391 10*3/uL (ref 150–400)
RBC: 3.52 MIL/uL — ABNORMAL LOW (ref 3.87–5.11)
RDW: 15.9 % — ABNORMAL HIGH (ref 11.5–15.5)
WBC: 12.7 10*3/uL — ABNORMAL HIGH (ref 4.0–10.5)
nRBC: 0 % (ref 0.0–0.2)

## 2022-03-03 LAB — GLUCOSE, CAPILLARY
Glucose-Capillary: 132 mg/dL — ABNORMAL HIGH (ref 70–99)
Glucose-Capillary: 172 mg/dL — ABNORMAL HIGH (ref 70–99)
Glucose-Capillary: 169 mg/dL — ABNORMAL HIGH (ref 70–99)
Glucose-Capillary: 139 mg/dL — ABNORMAL HIGH (ref 70–99)

## 2022-03-03 LAB — BASIC METABOLIC PANEL
Anion gap: 11 (ref 5–15)
BUN: 48 mg/dL — ABNORMAL HIGH (ref 8–23)
CO2: 24 mmol/L (ref 22–32)
Calcium: 9 mg/dL (ref 8.9–10.3)
Chloride: 102 mmol/L (ref 98–111)
Creatinine, Ser: 2.49 mg/dL — ABNORMAL HIGH (ref 0.44–1.00)
GFR, Estimated: 19 mL/min — ABNORMAL LOW (ref >60)
Glucose, Bld: 131 mg/dL — ABNORMAL HIGH (ref 70–99)
Potassium: 4.3 mmol/L (ref 3.5–5.1)
Sodium: 137 mmol/L (ref 135–145)

## 2022-03-03 MED ORDER — SODIUM CHLORIDE 0.9 % IV SOLN: INTRAVENOUS | Status: DC

## 2022-03-03 NOTE — TOC Progression Note (Addendum)
Transition of Care [facility]) - Progression Note    Patient Details  Name: Si Forbes MRN: [id number] Date of Birth: 1945/05/13  Transition of Care [facility]) CM/SW Contact  Rodney Booze, LCSW Phone Number: 03/03/2022, 10:58 AM  Clinical Narrative:    CSW spoke to the son, the son is wanting home health at this time. The son is wanting the patient to go home. This CSW has left a hand off for the CSW to follow up with the family at DC. TOC will continue to follow   Addend@ 3:15 Levada Dy from [facility] has agreed to take the patient.    Expected Discharge Plan: [place] Barriers to Discharge: Continued Medical Work up  Expected Discharge Plan and Services   Discharge Planning Services: CM Consult   Living arrangements for the past 2 months: Single Family Home                                       Social Determinants of Health (SDOH) Interventions SDOH Screenings   Food Insecurity: No Food Insecurity (02/25/2022)  Housing: Low Risk  (02/25/2022)  Transportation Needs: No Transportation Needs (02/25/2022)  Utilities: Not At Risk (02/25/2022)  Alcohol Screen: Low Risk  (12/27/2020)  Depression (PHQ2-9): High Risk (02/22/2022)  Financial Resource Strain: Low Risk  (12/27/2020)  Physical Activity: Inactive (12/27/2020)  Social Connections: Socially Isolated (12/27/2020)  Stress: No Stress Concern Present (12/27/2020)  Tobacco Use: High Risk (03/02/2022)    Readmission Risk Interventions     No data to display

## 2022-03-03 NOTE — Progress Notes (Signed)
PROGRESS NOTE   Abigail Wallace  [id number] DOB: 02/26/1945 DOA: 02/25/2022 PCP: Martyn Malay, MD this is a FMTS patient    Brief Narrative:   77 year old white female (wheelchair dependent) known schizophrenia with tactile hallucinations complicated by dysphagia (refuses scope) Complete heart block 2011 status post pacemaker, atrial fibrillation CHADVASC >4 on Eliquis Nonobstructive cath 08/2010 Prior tobacco with likely underlying COPD, HLD, DM TY 2 CKD 3B Psychosis status post hospitalizations at behavioral hospitals in [place] in the past Documentation from teaching service on 2/8 shows that she was started on Lasix by her nephrologist within the past month and had been treated with a 10-day course of Cipro Brought to emergency room secondary to lower extremity swelling unable to stand for several days choking a lot more than normal low-grade fevers  BNP 690 troponin 18 WBC 12,000 COVID-negative, ammonia level 11 CXR = CHF Refused CT head for workup of stroke 2/12 = echocardiogram = severe asymmetric LVH, 2/13 psychiatry consulted, palliative care consulted 2/14 CT head shows 5 mm Meningioma=--OP follow up needed with NS--- she refused CT angiogram [cannot get MRI as pacemaker incompatible ]    Hospital-Problem based course  Decompensated HFrEF-->HFpEF 55% today --I Lasix completely stopped as has probably been over diuresed  Continue metoprolol 25 twice daily amlodipine 10 hydralazine as needed  Acute urinary retention causing AKI this hospitalization - Needs indwelling Foley  as 2 episodes of retention will need to have this removed in the outpatient setting. -I creatinine still slightly elevated-if not better will get renal ultrasound in a.m. - Start saline 50 cc/H  Acute superimposed on toxic metabolic encephalopathy on admission - Intermittently confused likely worsened by schizophrenia -She will need neurocognitive testing -I do believe she does have some  underlying dementia superimposed on her schizophrenia  Underlying schizophrenia Delusions of infection (UTI)/reference as well as rapid speech - Continue Depakote 250 twice daily-- Depakote level 29-no adjustment at this time - Continue Haldol 2 mg twice daily - Defer to psychiatry  Sick sinus syndrome with pacemaker since 2011, CHADVASC >4 on Eliquis Prolonged QTc of 535 on 02/27/2022 -Watch QTc , reinitiating metoprolol as above -Continue Eliquis 5 twice daily  Presumed COPD with prior tobacco  DM TY 2 underlying CKD 3B -CBGs 130-170 - Sliding scale alone at this time, Tradjenta 5 daily on hold  Sacral decubitus ulcers -Not examined today, see below  Meningioma 5 mm to brain -Leukocytosis seems chronic -Someone performed a sed rate in 2023---had lung nodules based on 05/05/21  [lung rads 2] -Patient refused CTA head on 2/15 and would not reattempt as she does not want to after I speak to her  Lung Rads-2 -needs Rpt CT chest ~04/2022 - Chest x-ray 02/28/2022 showed only cardiomegaly  Leukocytosis -?  Low-grade underlying Aspiration? Skin maceraiton  -Would hesitate to obtain exotic workup for this, if felt necessary however could obtain biomarkers such as CRP ESR and others in the outpatient setting -Dysphagia 3 diet as per SLP eval 02/26/2022  Mild underlying dementia -See above   DVT prophylaxis: apixaban 5 bid Code Status: Full code Family Communication: Discussed daily with son since admission Disposition:  Status is: Inpatient Remains inpatient appropriate because:   Still has AKI and need to ensure that this is resolving prior to discharge with 1 more lab     Subjective:  "I want to go home" "can you give me something for my bladder infection" Awake coherent on oxygen  Objective: Vitals:   03/02/22 2049 03/03/22  0456 03/03/22 0748 03/03/22 1130  BP: (!) 167/65 (!) 165/66  (!) 159/71  Pulse: 68 (!) 58  70  Resp: 20 20  17  $ Temp: 99.3 F (37.4 C) 99.3  F (37.4 C)  98.5 F (36.9 C)  TempSrc: Oral Oral  Oral  SpO2: 95% 95% 96% 93%  Weight:      Height:        Intake/Output Summary (Last 24 hours) at 03/03/2022 1358 Last data filed at 03/03/2022 1216 Gross per 24 hour  Intake 1200 ml  Output 1400 ml  Net -200 ml    Filed Weights   02/28/22 0415 03/01/22 0500 03/02/22 0500  Weight: 128 kg 123.8 kg 124.7 kg    Examination:  Exam mostly unchanged over several days  EOMI NCAT no focal deficit neck soft supple no icterus no pallor Mallampati 4 no wheeze no rales no rhonchi Abdomen is soft nontender no rebound Foley in place Lower extremities are soft with slight edema I did not examine heels or sacrum She remains on oxygen   Data Reviewed: personally reviewed   CBC    Component Value Date/Time   WBC 12.7 (H) 03/03/2022 0446   RBC 3.52 (L) 03/03/2022 0446   HGB 9.3 (L) 03/03/2022 0446   HGB 10.6 (L) 02/22/2022 1659   HCT 31.6 (L) 03/03/2022 0446   HCT 33.1 (L) 02/22/2022 1659   PLT 391 03/03/2022 0446   PLT 294 02/22/2022 1659   MCV 89.8 03/03/2022 0446   MCV 84 02/22/2022 1659   MCH 26.4 03/03/2022 0446   MCHC 29.4 (L) 03/03/2022 0446   RDW 15.9 (H) 03/03/2022 0446   RDW 15.4 02/22/2022 1659   LYMPHSABS 2.9 03/03/2022 0446   LYMPHSABS 3.7 (H) 06/21/2021 1416   MONOABS 1.0 03/03/2022 0446   EOSABS 0.5 03/03/2022 0446   EOSABS 0.2 06/21/2021 1416   BASOSABS 0.1 03/03/2022 0446   BASOSABS 0.1 06/21/2021 1416      Latest Ref Rng & Units 03/03/2022    4:46 AM 03/02/2022    5:18 AM 03/01/2022    4:54 AM  CMP  Glucose 70 - 99 mg/dL 131  178  149   BUN 8 - 23 mg/dL 48  39  37   Creatinine 0.44 - 1.00 mg/dL 2.49  2.62  2.73   Sodium 135 - 145 mmol/L 137  136  139   Potassium 3.5 - 5.1 mmol/L 4.3  4.5  4.0   Chloride 98 - 111 mmol/L 102  102  103   CO2 22 - 32 mmol/L 24  24  24   $ Calcium 8.9 - 10.3 mg/dL 9.0  8.7  8.9      Radiology Studies: No results found.   Scheduled Meds:  allopurinol  100 mg Oral  Daily   amLODipine  10 mg Oral Daily   apixaban  5 mg Oral BID   Chlorhexidine Gluconate Cloth  6 each Topical Daily   divalproex  250 mg Oral Q12H   furosemide  20 mg Oral Daily   haloperidol  2 mg Oral BID   Or   haloperidol lactate  2 mg Intravenous BID   insulin aspart  0-9 Units Subcutaneous TID WC   metoprolol tartrate  25 mg Oral BID   multivitamin with minerals  1 tablet Oral Daily   pravastatin  40 mg Oral QPM   umeclidinium-vilanterol  1 puff Inhalation Daily   Continuous Infusions:  sodium chloride       LOS: 6 days  Time spent: [place], MD [facility] To contact the attending provider between 7A-7P or the covering provider during after hours 7P-7A, please log into the web site [email or web address] and access using universal [place] password for that web site. If you do not have the password, please call the hospital operator.  03/03/2022, 1:58 PM

## 2022-03-03 NOTE — Progress Notes (Signed)
RT note: Pt. refused CPAP again for this evening, made aware to notify if wanting to use.

## 2022-03-04 ENCOUNTER — Inpatient Hospital Stay (HOSPITAL_COMMUNITY): Payer: 59

## 2022-03-04 DIAGNOSIS — J810 Acute pulmonary edema: Secondary | ICD-10-CM | POA: Diagnosis not present

## 2022-03-04 LAB — RENAL FUNCTION PANEL
Albumin: 3.2 g/dL — ABNORMAL LOW (ref 3.5–5.0)
Anion gap: 10 (ref 5–15)
BUN: 40 mg/dL — ABNORMAL HIGH (ref 8–23)
CO2: 22 mmol/L (ref 22–32)
Calcium: 9.4 mg/dL (ref 8.9–10.3)
Chloride: 105 mmol/L (ref 98–111)
Creatinine, Ser: 1.84 mg/dL — ABNORMAL HIGH (ref 0.44–1.00)
GFR, Estimated: 28 mL/min — ABNORMAL LOW (ref >60)
Glucose, Bld: 158 mg/dL — ABNORMAL HIGH (ref 70–99)
Phosphorus: 3.7 mg/dL (ref 2.5–4.6)
Potassium: 4.7 mmol/L (ref 3.5–5.1)
Sodium: 137 mmol/L (ref 135–145)

## 2022-03-04 LAB — GLUCOSE, CAPILLARY
Glucose-Capillary: 136 mg/dL — ABNORMAL HIGH (ref 70–99)
Glucose-Capillary: 138 mg/dL — ABNORMAL HIGH (ref 70–99)
Glucose-Capillary: 143 mg/dL — ABNORMAL HIGH (ref 70–99)
Glucose-Capillary: 129 mg/dL — ABNORMAL HIGH (ref 70–99)

## 2022-03-04 MED ORDER — SORBITOL 70 % SOLN
15.0000 mL | Freq: Once | Status: AC
  Administered 2022-03-04: 15 mL via ORAL
  Filled 2022-03-04: qty 30

## 2022-03-04 MED ORDER — OLANZAPINE 10 MG IM SOLR
2.5000 mg | Freq: Once | INTRAMUSCULAR | Status: DC | PRN
  Filled 2022-03-04 (×2): qty 10

## 2022-03-04 MED ORDER — TRAZODONE HCL 50 MG PO TABS
50.0000 mg | ORAL_TABLET | Freq: Every day | ORAL | Status: AC
  Administered 2022-03-04: 50 mg via ORAL
  Filled 2022-03-04: qty 1

## 2022-03-04 MED ORDER — SORBITOL 70 % SOLN
960.0000 mL | TOPICAL_OIL | Freq: Once | ORAL | Status: AC
  Administered 2022-03-04: 960 mL via RECTAL
  Filled 2022-03-04: qty 240

## 2022-03-04 MED ORDER — SORBITOL 70 % SOLN: 200.0000 mL | TOPICAL_OIL | Freq: Once | ORAL | Status: DC

## 2022-03-04 MED ORDER — BISACODYL 10 MG RE SUPP
10.0000 mg | Freq: Once | RECTAL | Status: AC
  Administered 2022-03-04: 10 mg via RECTAL
  Filled 2022-03-04: qty 1

## 2022-03-04 MED ORDER — FUROSEMIDE 20 MG PO TABS
20.0000 mg | ORAL_TABLET | Freq: Once | ORAL | Status: AC
  Administered 2022-03-04: 20 mg via ORAL
  Filled 2022-03-04: qty 1

## 2022-03-04 NOTE — Plan of Care (Signed)
  Problem: Cardiac: Goal: Ability to achieve and maintain adequate cardiopulmonary perfusion will improve Outcome: Progressing   Problem: Health Behavior/Discharge Planning: Goal: Ability to manage health-related needs will improve Outcome: Progressing   Problem: Nutrition: Goal: Adequate nutrition will be maintained Outcome: Progressing   Problem: Coping: Goal: Level of anxiety will decrease Outcome: Progressing

## 2022-03-04 NOTE — Progress Notes (Signed)
RT note: 4E called for rm. 1401 due to low Oxygen saturations along with >'d WOB, upon arrival~89% sats along with some audible/diffuse wheezing, >RR-26-28, noted to be in some more mild than moderate distress, currently on 2 lpm n/c which was >'d to 3l with order for >92%, given X1 Prn Duoneb aerosol nebulizer tx. which was tolerated fairly well, has CPAP in room which pt. has not used/unable to tolerate, pt. gradually improved after nebulizer tx. with n/c left placed at 3 lpm, able to wean for sats > 92%, staff to monitor.

## 2022-03-04 NOTE — TOC Progression Note (Signed)
Transition of Care [facility]) - Progression Note    Patient Details  Name: Abigail Wallace MRN: [id number] Date of Birth: [birth year]  Transition of Care [facility]) CM/SW Contact  Henrietta Dine, RN Phone Number: 03/04/2022, 4:02 PM  Clinical Narrative:    Recc for SNF; will pass on to oncoming TOC; ins auth.   Expected Discharge Plan: [place] Barriers to Discharge: Continued Medical Work up  Expected Discharge Plan and Services   Discharge Planning Services: CM Consult   Living arrangements for the past 2 months: Single Family Home                                       Social Determinants of Health (SDOH) Interventions SDOH Screenings   Food Insecurity: No Food Insecurity (02/25/2022)  Housing: Low Risk  (02/25/2022)  Transportation Needs: No Transportation Needs (02/25/2022)  Utilities: Not At Risk (02/25/2022)  Alcohol Screen: Low Risk  (12/27/2020)  Depression (PHQ2-9): High Risk (02/22/2022)  Financial Resource Strain: Low Risk  (12/27/2020)  Physical Activity: Inactive (12/27/2020)  Social Connections: Socially Isolated (12/27/2020)  Stress: No Stress Concern Present (12/27/2020)  Tobacco Use: High Risk (03/02/2022)    Readmission Risk Interventions     No data to display

## 2022-03-04 NOTE — TOC Progression Note (Incomplete Revision)
Transition of Care [facility]) - Progression Note    Patient Details  Name: Abigail Wallace MRN: [id number] Date of Birth: 1945-02-16  Transition of Care [facility]) CM/SW Contact  Henrietta Dine, RN Phone Number: 03/04/2022, 4:02 PM  Clinical Narrative:      Expected Discharge Plan: Skilled Nursing Facility Barriers to Discharge: Continued Medical Work up  Expected Discharge Plan and Services   Discharge Planning Services: CM Consult   Living arrangements for the past 2 months: Single Family Home                                       Social Determinants of Health (SDOH) Interventions SDOH Screenings   Food Insecurity: No Food Insecurity (02/25/2022)  Housing: Low Risk  (02/25/2022)  Transportation Needs: No Transportation Needs (02/25/2022)  Utilities: Not At Risk (02/25/2022)  Alcohol Screen: Low Risk  (12/27/2020)  Depression (PHQ2-9): High Risk (02/22/2022)  Financial Resource Strain: Low Risk  (12/27/2020)  Physical Activity: Inactive (12/27/2020)  Social Connections: Socially Isolated (12/27/2020)  Stress: No Stress Concern Present (12/27/2020)  Tobacco Use: High Risk (03/02/2022)    Readmission Risk Interventions     No data to display

## 2022-03-04 NOTE — Progress Notes (Signed)
PROGRESS NOTE   Abigail Wallace  [id number] DOB: March 06, 1945 DOA: 02/25/2022 PCP: Martyn Malay, MD this is a [facility] patient    Brief Narrative:   77 year old white female (wheelchair dependent) known schizophrenia with tactile hallucinations complicated by dysphagia (refuses scope) Complete heart block 2011 status post pacemaker, atrial fibrillation CHADVASC >4 on Eliquis Nonobstructive cath 08/2010 Prior tobacco with likely underlying COPD, HLD, DM TY 2 CKD 3B Psychosis status post hospitalizations at behavioral hospitals in [place] in the past Documentation from teaching service on 2/8 shows that she was started on Lasix by her nephrologist within the past month and had been treated with a 10-day course of Cipro Brought to emergency room secondary to lower extremity swelling unable to stand for several days choking a lot more than normal low-grade fevers  BNP 690 troponin 18 WBC 12,000 COVID-negative, ammonia level 11 CXR = CHF Refused CT head for workup of stroke 2/12 = echocardiogram = severe asymmetric LVH, 2/13 psychiatry consulted, palliative care consulted 2/14 CT head shows 5 mm Meningioma=--OP follow up needed with NS--- she refused CT angiogram [cannot get MRI as pacemaker incompatible ]   Hospital-Problem based course  Decompensated HFrEF-->HFpEF 55% today -Lasix completely stopped --was over diuresed  -Continue metoprolol 25 twice daily amlodipine 10 hydralazine as needed  Acute urinary retention causing AKI this hospitalization - Needs indwelling Foley as 2 episodes of retention will need to have this removed in the outpatient setting--Need [facility] f/u at D/[facility] - Creatinine is improved with IV fluids IV and patient saline lock 2/18  Acute superimposed on toxic metabolic encephalopathy on admission - Intermittently confused likely worsened by schizophrenia -She will need neurocognitive testing -I do believe she does have some underlying dementia  superimposed on her schizophrenia -per son request have filled out CAP form   Underlying schizophrenia Delusions of infection (UTI)/reference as well as rapid speech - Continue Depakote 250 twice daily-- Depakote level 29-no adjustment at this time - Continue Haldol 2 mg twice daily - Psychiatry input appreciated it seems they have signed off  Sick sinus syndrome with pacemaker since 2011, CHADVASC >4 on Eliquis Prolonged QTc of 535 on 02/27/2022 -Watch QTc periodically, reinitiating metoprolol as above -Continue Eliquis 5 twice daily  Presumed COPD with prior tobacco  DM TY 2 underlying CKD 3B -CBGs 130-170 - Sliding scale alone at this time, Tradjenta 5 daily on hold  Sacral decubitus ulcers -Not examined today, see below  Meningioma 5 mm to brain -Leukocytosis seems chronic -Someone performed a sed rate in 2023---had lung nodules based on 05/05/21  [lung rads 2] -Patient refused CTA head on 2/15 and would not reattempt as she does not want to after I speak to her, can attempt as outpatient  Lung Rads-2 -needs Rpt CT chest ~04/2022 - Chest x-ray 02/28/2022 showed only cardiomegaly  Leukocytosis -?  Low-grade underlying Aspiration? Skin maceraiton  -Would hesitate to obtain exotic workup for this, if felt necessary however could obtain biomarkers such as CRP ESR and others in the outpatient setting -Dysphagia 3 diet as per SLP eval 02/26/2022  Mild underlying dementia -See above, need OP MoCA   DVT prophylaxis: apixaban 5 bid Code Status: Full code Family Communication: Discussed daily with son since admission Disposition:  Status is: Inpatient Remains inpatient appropriate because:   Home am if no wheeze and Creat improved closer to baseline     Subjective:  "I want to go home" Patient had some wheezing overnight and needed a neb, son confirms that  she smokes intermittently and we agreed that given her schizophrenia etc. as well as mild dementia that low yield to  counsel her instead just let her be happy No chest pain   Objective: Vitals:   03/04/22 0740 03/04/22 0829 03/04/22 1123 03/04/22 1319  BP:  (!) 186/89  (!) 167/54  Pulse:    (!) 55  Resp:    16  Temp:    98.9 F (37.2 C)  TempSrc:    Oral  SpO2: 98%  94% 93%  Weight:      Height:        Intake/Output Summary (Last 24 hours) at 03/04/2022 1531 Last data filed at 03/04/2022 1200 Gross per 24 hour  Intake 542.93 ml  Output 1150 ml  Net -607.07 ml    Filed Weights   02/28/22 0415 03/01/22 0500 03/02/22 0500  Weight: 128 kg 123.8 kg 124.7 kg    Examination:   Thick neck Mallampati 3 Chest is clear no wheeze no rales no rhonchi S1-S2 no murmur sinus rhythm on monitors Discontinue telemetry Abdomen soft no rebound no guarding Neuro intact moving all 4 limbs equally  Data Reviewed: personally reviewed   CBC    Component Value Date/Time   WBC 12.7 (H) 03/03/2022 0446   RBC 3.52 (L) 03/03/2022 0446   HGB 9.3 (L) 03/03/2022 0446   HGB 10.6 (L) 02/22/2022 1659   HCT 31.6 (L) 03/03/2022 0446   HCT 33.1 (L) 02/22/2022 1659   PLT 391 03/03/2022 0446   PLT 294 02/22/2022 1659   MCV 89.8 03/03/2022 0446   MCV 84 02/22/2022 1659   MCH 26.4 03/03/2022 0446   MCHC 29.4 (L) 03/03/2022 0446   RDW 15.9 (H) 03/03/2022 0446   RDW 15.4 02/22/2022 1659   LYMPHSABS 2.9 03/03/2022 0446   LYMPHSABS 3.7 (H) 06/21/2021 1416   MONOABS 1.0 03/03/2022 0446   EOSABS 0.5 03/03/2022 0446   EOSABS 0.2 06/21/2021 1416   BASOSABS 0.1 03/03/2022 0446   BASOSABS 0.1 06/21/2021 1416      Latest Ref Rng & Units 03/04/2022    5:26 AM 03/03/2022    4:46 AM 03/02/2022    5:18 AM  CMP  Glucose 70 - 99 mg/dL 158  131  178   BUN 8 - 23 mg/dL 40  48  39   Creatinine 0.44 - 1.00 mg/dL 1.84  2.49  2.62   Sodium 135 - 145 mmol/L 137  137  136   Potassium 3.5 - 5.1 mmol/L 4.7  4.3  4.5   Chloride 98 - 111 mmol/L 105  102  102   CO2 22 - 32 mmol/L 22  24  24   $ Calcium 8.9 - 10.3 mg/dL 9.4  9.0   8.7      Radiology Studies: DG CHEST PORT 1 VIEW  Result Date: 03/04/2022 CLINICAL DATA:  Pleural effusion EXAM: PORTABLE CHEST 1 VIEW COMPARISON:  Four days ago FINDINGS: Cardiopericardial enlargement and vascular pedicle widening. Dual-chamber pacer leads from the left are in stable position. Diffuse interstitial opacity with Kerley lines and hazy density of the chest. Pleural fluid on the right at least. IMPRESSION: CHF. Electronically Signed   By: Jorje Guild M.D.   On: 03/04/2022 08:39     Scheduled Meds:  allopurinol  100 mg Oral Daily   amLODipine  10 mg Oral Daily   apixaban  5 mg Oral BID   Chlorhexidine Gluconate Cloth  6 each Topical Daily   divalproex  250 mg Oral Q12H  haloperidol  2 mg Oral BID   Or   haloperidol lactate  2 mg Intravenous BID   insulin aspart  0-9 Units Subcutaneous TID WC   metoprolol tartrate  25 mg Oral BID   multivitamin with minerals  1 tablet Oral Daily   pravastatin  40 mg Oral QPM   traZODone  50 mg Oral QHS   umeclidinium-vilanterol  1 puff Inhalation Daily   Continuous Infusions:    LOS: 7 days   Time spent: [place], MD [facility] To contact the attending provider between 7A-7P or the covering provider during after hours 7P-7A, please log into the web site [email or web address] and access using universal [place] password for that web site. If you do not have the password, please call the hospital operator.  03/04/2022, 3:31 PM

## 2022-03-05 ENCOUNTER — Telehealth: Payer: Self-pay

## 2022-03-05 ENCOUNTER — Other Ambulatory Visit (HOSPITAL_COMMUNITY): Payer: Self-pay

## 2022-03-05 DIAGNOSIS — R4781 Slurred speech: Secondary | ICD-10-CM | POA: Diagnosis not present

## 2022-03-05 DIAGNOSIS — F201 Disorganized schizophrenia: Secondary | ICD-10-CM | POA: Diagnosis not present

## 2022-03-05 DIAGNOSIS — J810 Acute pulmonary edema: Secondary | ICD-10-CM | POA: Diagnosis not present

## 2022-03-05 LAB — BASIC METABOLIC PANEL
Anion gap: 12 (ref 5–15)
BUN: 39 mg/dL — ABNORMAL HIGH (ref 8–23)
CO2: 24 mmol/L (ref 22–32)
Calcium: 9.7 mg/dL (ref 8.9–10.3)
Chloride: 101 mmol/L (ref 98–111)
Creatinine, Ser: 1.83 mg/dL — ABNORMAL HIGH (ref 0.44–1.00)
GFR, Estimated: 28 mL/min — ABNORMAL LOW (ref >60)
Glucose, Bld: 151 mg/dL — ABNORMAL HIGH (ref 70–99)
Potassium: 4.4 mmol/L (ref 3.5–5.1)
Sodium: 137 mmol/L (ref 135–145)

## 2022-03-05 LAB — GLUCOSE, CAPILLARY
Glucose-Capillary: 131 mg/dL — ABNORMAL HIGH (ref 70–99)
Glucose-Capillary: 172 mg/dL — ABNORMAL HIGH (ref 70–99)

## 2022-03-05 MED ORDER — FUROSEMIDE 40 MG PO TABS
40.0000 mg | ORAL_TABLET | ORAL | 0 refills | Status: DC
  Filled 2022-03-05: qty 45, 90d supply, fill #0

## 2022-03-05 MED ORDER — HALOPERIDOL 2 MG PO TABS: 2.0000 mg | ORAL_TABLET | Freq: Two times a day (BID) | ORAL | Status: DC | PRN

## 2022-03-05 MED ORDER — HYDRALAZINE HCL 25 MG PO TABS
25.0000 mg | ORAL_TABLET | Freq: Three times a day (TID) | ORAL | 1 refills | Status: DC | PRN
  Filled 2022-03-05: qty 90, 30d supply, fill #0

## 2022-03-05 MED ORDER — DIVALPROEX SODIUM 250 MG PO DR TAB
250.0000 mg | DELAYED_RELEASE_TABLET | Freq: Two times a day (BID) | ORAL | 1 refills | Status: AC
  Filled 2022-03-05: qty 60, 30d supply, fill #0

## 2022-03-05 MED ORDER — SODIUM BICARBONATE 650 MG PO TABS
650.0000 mg | ORAL_TABLET | Freq: Every day | ORAL | 1 refills | Status: DC
  Filled 2022-03-05: qty 90, 90d supply, fill #0

## 2022-03-05 MED ORDER — HALOPERIDOL 2 MG PO TABS
2.0000 mg | ORAL_TABLET | Freq: Two times a day (BID) | ORAL | 1 refills | Status: DC
  Filled 2022-03-05: qty 60, 30d supply, fill #0

## 2022-03-05 MED ORDER — HALOPERIDOL 2 MG PO TABS: 2.0000 mg | ORAL_TABLET | Freq: Two times a day (BID) | ORAL | 0 refills | Status: DC | PRN

## 2022-03-05 MED ORDER — METOPROLOL TARTRATE 25 MG PO TABS
25.0000 mg | ORAL_TABLET | Freq: Two times a day (BID) | ORAL | 1 refills | Status: DC
  Filled 2022-03-05: qty 60, 30d supply, fill #0

## 2022-03-05 NOTE — Discharge Summary (Signed)
Physician Discharge Summary  Abigail Wallace [id number] DOB: [birth year] DOA: 02/25/2022  PCP: Abigail Malay, MD  Admit date: 02/25/2022 Discharge date: 03/05/2022  Time spent: 57 minutes  Recommendations for Outpatient Follow-up:  Needs outpatient Chem-7 magnesium in 1 week at PCP office Patient's family given number for [facility] to call and make a 3-week Foley/dynamic urine studies in the outpatient setting to remove Foley catheter Suggest EKG on follow-up at PCP office given QTc prolongation-would avoid meds that propagate this Patient has documented refusals at times of care-would recommend palliative care to follow-up in the outpatient setting and have engaged them if patient refuses care in addition to home health therapies at home as son wanted the patient to come home Recommend referral for neurocognitive testing and will need outpatient MoCA testing - If patient is willing, get CT head in 3 to 6 months, do CT chest for lung RADS lesion noted 2023---could consider CRP and other labs as previously done depending on goals of care wishes per son - Careful coordination of Lasix dosing with instructions as per below  Discharge Diagnoses:  MAIN problem for hospitalization   ACute new onset heart failure Probable vaginal prolapse leading to urinary retention COPD A-fib CHADVASC >4/sick sinus syndrome with PPM on Eliquis Prolonged QTc 520 on discharge Psychosis with previous hospitalizations PTSD etc. CKD 3 Probable underlying mild dementia New diagnosis 5 mm meningioma brain Known lung RADS 2 lesion in need of follow-up DM TY 2 Sacral decubitus stage II present prior to admission Dysphagia 3  Please see below for itemized issues addressed in HOpsital- refer to other progress notes for clarity if needed  Discharge Condition: Improved  Diet recommendation: Diabetic heart healthy  Filed Weights   03/01/22 0500 03/02/22 0500 03/05/22 0500  Weight: 123.8 kg 124.7 kg  125.8 kg    History of present illness:  77 year old white female (wheelchair dependent) known schizophrenia with tactile hallucinations complicated by dysphagia (refuses scope) Complete heart block 2011 status post pacemaker, atrial fibrillation CHADVASC >4 on Eliquis Nonobstructive cath 08/2010 Prior tobacco with likely underlying COPD, HLD, DM TY 2 CKD 3B Psychosis status post hospitalizations at behavioral hospitals in [place] in the past Documentation from teaching service on 2/8 shows that she was started on Lasix by her nephrologist within the past month and had been treated with a 10-day course of Cipro  Brought to emergency room secondary to lower extremity swelling unable to stand for several days choking a lot more than normal low-grade fevers   BNP 690 troponin 18 WBC 12,000 COVID-negative, ammonia level 11 CXR = CHF Refused CT head for workup of stroke 2/12 = echocardiogram = severe asymmetric LVH, 2/13 psychiatry consulted, palliative care consulted 2/14 CT head shows 5 mm Meningioma=--OP follow up needed with NS--- she refused CT angiogram [cannot get MRI as pacemaker incompatible ]  Hospital Course:  Decompensated HFrEF-->HFpEF 55% today - Lasix resumed on discharge every other day with instructions as below and needs labs in about a week -Continue metoprolol 25 twice daily amlodipine 10  -Will be difficult to avoid volume overload in this patient, given her schizophrenia and probable lack of understanding - Son is aware of these difficulties and is interested in palliative care should she fail to thrive   Acute urinary retention causing AKI this hospitalization superimposed on CKD 3B - Needs indwelling Foley as 2 episodes of retention will need to have this removed in the outpatient setting--Need [facility] f/u at D/c - Creatinine is improved with  IV fluids IV and patient saline lock 2/18 -Patient is on sodium bicarb and this may need to be adjusted in the  outpatient setting organ to discontinued  Acute superimposed on toxic metabolic encephalopathy on admission - Intermittently confused likely worsened by schizophrenia -She will need neurocognitive testing -I do believe she does have some underlying dementia superimposed on her schizophrenia -per son request have filled out CAP form on 2/18   Underlying schizophrenia Delusions of infection (UTI)/reference as well as rapid speech - Continue Depakote 250 twice daily-- Depakote level 29-no adjustment at this time - Continue Haldol 2 mg twice daily - Psychiatry input appreciated it seems they have signed off   Sick sinus syndrome with pacemaker since 2011, CHADVASC >4 on Eliquis Prolonged QTc of 520on 03/02/2022 -Watch QTc periodically, reinitiating metoprolol as above -Continue Eliquis 5 twice daily   Presumed COPD with prior tobacco -Smokes intermittently   DM TY 2 underlying CKD 3B -CBGs moderately well-controlled this hospital stay on sliding scale on discharge resumed - Tradjenta 5 daily    Sacral decubitus ulcers -Not examined and has been stable with turning as per nursing   Meningioma 5 mm to brain -Leukocytosis seems chronic -Someone performed a sed rate in 2023---had lung nodules based on 05/05/21  [lung rads 2] -Patient refused CTA head on 2/15 and would not reattempt as she does not want to after I speak to her, can attempt as outpatient per patient and family wishes   Lung Rads-2 -needs Rpt CT chest ~04/2022 - Chest x-ray 02/28/2022 showed only cardiomegaly   Leukocytosis -?  Low-grade underlying Aspiration? Skin maceraiton  -Would hesitate to obtain exotic workup for this, if felt necessary however could obtain biomarkers such as CRP ESR and others in the outpatient setting -Dysphagia 3 diet as per SLP eval 02/26/2022   Mild underlying dementia -See above, need OP MoCA     Discharge Exam: Vitals:   03/05/22 0428 03/05/22 0829  BP: (!) 143/74   Pulse: 79   Resp:  17   Temp: 98 F (36.7 C)   SpO2: 96% 97%    Subj on day of d/c  Seen with son at the bedside looks stable-he asks about palliative care and I briefly explained the differences between aggressive medical care and palliative care to him-social worker to follow-up in case he is interested in a referral Patient herself seems fine she is somewhat labile intermittently but pleasant and   Awake pleasant slightly pressured speech with oral labial smacking movements On oxygen Chest is clear no wheeze no rales no rhonchi Abdomen is obese nontender no rebound Foley catheter in place Trace lower extremity edema no heel wounds or ulcers Neurologically is intact no focal deficit   Discharge Instructions  You are diagnosed with heart failure this hospital stay when you came in  You also have acute urinary retention which needs follow-up at urology office probably in 3 weeks to attempt to take the catheter out-the number to call for their office is listed for you  You will need every other day Lasix to keep you out of heart failure 40 mg--- this will need to be adjusted carefully with your primary physician as you may need to go up or down this dose based on the dry weight that we have recorded as being 125 kg--in other words if you gain more than 2 kg in a 24-hour period of time you will need to take Lasix at double dose that day of 80 mg and  call the doctor   Note changes to your medications carefully as below especially the Depakote and the Haldol  Note dosage changes of several meds also such as your metoprolol  We will get palliative to see you in the outpatient setting and with your primary physician Dr. Alphonzo Severance you will need to determine if you want further workup of the small meningioma in your brain with further imaging-this is yet to be determined--- you will also need if warranted or thought needed by your primary physician another chest CT given you had some abnormalities on your CT  scan in 2023    Allergies as of 03/05/2022       Reactions   Aripiprazole Anaphylaxis, Other (See Comments), Shortness Of Breath   Tremors   Clozapine Anaphylaxis   Benadryl [diphenhydramine Hcl] Other (See Comments)   States affects her vision   Benztropine Other (See Comments)   Affects mobility   Latuda [lurasidone Hcl] Other (See Comments)   "Tremors and shakes."    Lorazepam Other (See Comments)   Unknown reaction??   Remeron [mirtazapine] Other (See Comments)   Tremors and shakes   Haloperidol Lactate Other (See Comments)   Unknown reaction??   Keflex [cephalexin] Swelling, Other (See Comments)   Per patient, has tongue swelling with keflex but can tolerate amoxicillin   Codeine Other (See Comments)   Unknown reaction??   Latex Rash        Medication List     STOP taking these medications    divalproex 250 MG 24 hr tablet Commonly known as: DEPAKOTE ER Replaced by: divalproex 250 MG DR tablet   nitroGLYCERIN 0.4 MG SL tablet Commonly known as: NITROSTAT   OLANZapine 7.5 MG tablet Commonly known as: ZYPREXA   olmesartan 5 MG tablet Commonly known as: BENICAR       TAKE these medications    acetaminophen 500 MG tablet Commonly known as: TYLENOL Take 1,000 mg by mouth every 6 (six) hours as needed for mild pain or headache.   albuterol 108 (90 Base) MCG/ACT inhaler Commonly known as: VENTOLIN HFA Inhale 2 puffs into the lungs every 6 (six) hours as needed for wheezing or shortness of breath.   allopurinol 100 MG tablet Commonly known as: ZYLOPRIM Take 1 tablet (100 mg total) by mouth daily.   amLODipine 10 MG tablet Commonly known as: NORVASC Take 1 tablet (10 mg total) by mouth daily.   Anoro Ellipta 62.5-25 MCG/ACT Aepb Generic drug: umeclidinium-vilanterol Inhale 1 puff into the lungs daily.   apixaban 5 MG Tabs tablet Commonly known as: Eliquis Take 1 tablet (5 mg total) by mouth 2 (two) times daily.   cholecalciferol 25 MCG (1000  UNIT) tablet Commonly known as: VITAMIN D3 Take 1 tablet (1,000 Units total) by mouth daily.   Diapers & Supplies Misc 1 Product by Does not apply route 6 (six) times daily.   diclofenac Sodium 1 % Gel Commonly known as: Voltaren Apply 2 g topically 4 (four) times daily as needed (pain).   divalproex 250 MG DR tablet Commonly known as: DEPAKOTE Take 1 tablet (250 mg total) by mouth every 12 (twelve) hours. Replaces: divalproex 250 MG 24 hr tablet   furosemide 40 MG tablet Commonly known as: Lasix Take 1 tablet (40 mg total) by mouth every other day. What changed:  medication strength how much to take when to take this   haloperidol 2 MG tablet Commonly known as: HALDOL Take 1 tablet (2 mg total) by mouth 2 (two)  times daily.   hydrALAZINE 25 MG tablet Commonly known as: APRESOLINE Take 1 tablet (25 mg total) by mouth every 8 (eight) hours as needed (BP>170/90 MMhg.).   hydrocortisone cream 0.5 % Apply 1 application topically 2 (two) times daily. Apply WITH Nystatin under left breast   Incontinence Supplies Misc 1 Units by Does not apply route as needed.   ipratropium-albuterol 0.5-2.5 (3) MG/3ML Soln Commonly known as: DUONEB Take 3 mLs by nebulization every 6 (six) hours as needed.   Lancet Device Misc 1 Device by Does not apply route 3 (three) times a week.   linagliptin 5 MG Tabs tablet Commonly known as: TRADJENTA Take 1 tablet (5 mg total) by mouth daily.   metoprolol tartrate 25 MG tablet Commonly known as: LOPRESSOR Take 1 tablet (25 mg total) by mouth 2 (two) times daily. What changed:  medication strength how much to take   mupirocin cream 2 % Commonly known as: BACTROBAN Apply 1 application. topically 2 (two) times daily. What changed:  when to take this reasons to take this   nystatin powder Commonly known as: MYCOSTATIN/NYSTOP Apply 1 Application topically 3 (three) times daily. What changed:  when to take this reasons to take  this Another medication with the same name was removed. Continue taking this medication, and follow the directions you see here.   OneTouch Delica Lancets 99991111 Misc Use to test once daily.  DX code:E11.9   OneTouch Verio test strip Generic drug: glucose blood Please use to check blood sugar once daily. E11.9   OneTouch Verio w/Device Kit Check blood sugar once daily   OVER THE COUNTER MEDICATION Apply 1 application  topically See admin instructions. Caldesene Medicated Protecting Powder- Apply under the breasts and between the skin folds 2 times a day as needed for irritation   pravastatin 40 MG tablet Commonly known as: PRAVACHOL TAKE ONE TABLET BY MOUTH EVERY EVENING   senna 8.6 MG Tabs tablet Commonly known as: SENOKOT Take 1 tablet (8.6 mg total) by mouth daily as needed for mild constipation.   sodium bicarbonate 650 MG tablet Take 1 tablet (650 mg total) by mouth daily. What changed:  how much to take when to take this               Durable Medical Equipment  (From admission, onward)           Start     Ordered   03/02/22 1047  For home use only DME oxygen  Once       Question Answer Comment  Length of Need Lifetime   Mode or (Route) Nasal cannula   Liters per Minute 2   Oxygen delivery system Gas      03/02/22 1048           Allergies  Allergen Reactions   Aripiprazole Anaphylaxis, Other (See Comments) and Shortness Of Breath    Tremors   Clozapine Anaphylaxis   Benadryl [Diphenhydramine Hcl] Other (See Comments)    States affects her vision   Benztropine Other (See Comments)    Affects mobility   Latuda [Lurasidone Hcl] Other (See Comments)    "Tremors and shakes."    Lorazepam Other (See Comments)    Unknown reaction??   Remeron [Mirtazapine] Other (See Comments)    Tremors and shakes   Haloperidol Lactate Other (See Comments)    Unknown reaction??   Keflex [Cephalexin] Swelling and Other (See Comments)    Per patient, has tongue  swelling with keflex but can  tolerate amoxicillin   Codeine Other (See Comments)    Unknown reaction??   Latex Rash      The results of significant diagnostics from this hospitalization (including imaging, microbiology, ancillary and laboratory) are listed below for reference.    Significant Diagnostic Studies: DG CHEST PORT 1 VIEW  Result Date: 03/04/2022 CLINICAL DATA:  Pleural effusion EXAM: PORTABLE CHEST 1 VIEW COMPARISON:  Four days ago FINDINGS: Cardiopericardial enlargement and vascular pedicle widening. Dual-chamber pacer leads from the left are in stable position. Diffuse interstitial opacity with Kerley lines and hazy density of the chest. Pleural fluid on the right at least. IMPRESSION: CHF. Electronically Signed   By: Jorje Guild M.D.   On: 03/04/2022 08:39   CT HEAD WO CONTRAST (5MM)  Result Date: 02/28/2022 CLINICAL DATA:  Provided history: Stroke, follow-up. EXAM: CT HEAD WITHOUT CONTRAST TECHNIQUE: Contiguous axial images were obtained from the base of the skull through the vertex without intravenous contrast. RADIATION DOSE REDUCTION: This exam was performed according to the departmental dose-optimization program which includes automated exposure control, adjustment of the mA and/or kV according to patient size and/or use of iterative reconstruction technique. COMPARISON:  Head CT 12/30/2021. FINDINGS: Brain: No age advanced or lobar predominant parenchymal atrophy. Redemonstrated chronic lacunar infarct within the left thalamus. 5 mm dural-based mass overlying the mid-to-anterior left frontal lobe compatible with a small meningioma (for instance as seen on series 5, image 29). No mass effect upon the underlying brain parenchyma. There is no acute intracranial hemorrhage. No demarcated cortical infarct. No extra-axial fluid collection. No midline shift. Vascular: No hyperdense vessel. Atherosclerotic calcifications. Skull: No fracture or aggressive osseous lesion.  Sinuses/Orbits: No mass or acute finding within the imaged orbits. No significant paranasal sinus disease. IMPRESSION: 1. No evidence of acute intracranial hemorrhage or acute infarct. 2. 5 mm dural-based mass overlying the mid-to-anterior left frontal lobe compatible with a small meningioma. No mass effect upon the underlying brain parenchyma. 3. Redemonstrated chronic lacunar infarct within the left thalamus. Electronically Signed   By: Kellie Simmering D.O.   On: 02/28/2022 15:15   DG CHEST PORT 1 VIEW  Result Date: 02/28/2022 CLINICAL DATA:  Pneumonia EXAM: PORTABLE CHEST 1 VIEW COMPARISON:  02/25/2022 FINDINGS: Cardiomegaly. No frank interstitial edema. No pleural effusion or pneumothorax. Left subclavian pacemaker. IMPRESSION: Cardiomegaly.  No frank interstitial edema. Electronically Signed   By: Julian Hy M.D.   On: 02/28/2022 08:23   ECHOCARDIOGRAM COMPLETE  Result Date: 02/26/2022    ECHOCARDIOGRAM REPORT   Patient Name:   Abigail Wallace Date of Exam: 02/26/2022 Medical Rec #:  [id number]       Height:       66.0 in Accession #:    [id number]      Weight:       283.7 lb Date of Birth:  Jul 28, 1945       BSA:          2.320 m Patient Age:    57 years        BP:           174/52 mmHg Patient Gender: F               HR:           70 bpm. Exam Location:  Inpatient Procedure: 2D Echo, Cardiac Doppler and Color Doppler Indications:    CHF-Acute Diastolic [id number]  History:        Patient has prior history of Echocardiogram examinations, most  recent 11/28/2020. CAD, Pacemaker, COPD, Arrythmias:Atrial                 Fibrillation; Risk Factors:Diabetes. Chronic kidney disease.  Sonographer:    Darlina Sicilian RDCS Referring Phys: Theola Sequin  Sonographer Comments: Imaging difficult due to altered mental status. IMPRESSIONS  1. Left ventricular ejection fraction, by estimation, is 55 to 60%. The left ventricle has normal function. The left ventricle has no regional wall motion  abnormalities. There is severe asymmetric left ventricular hypertrophy of the basal-septal segment. Left ventricular diastolic function could not be evaluated.  2. Right ventricular systolic function is normal. The right ventricular size is normal.  3. Left atrial size was mildly dilated.  4. Right atrial size was mildly dilated.  5. The mitral valve is grossly normal. No evidence of mitral valve regurgitation. No evidence of mitral stenosis.  6. The aortic valve is grossly normal. Aortic valve regurgitation is not visualized. No aortic stenosis is present. Comparison(s): Prior images reviewed side by side. Conclusion(s)/Recommendation(s): Difficult images, but LVEF appears grossly normal. This is an improvement from prior EF of 40-45%. FINDINGS  Left Ventricle: Left ventricular ejection fraction, by estimation, is 55 to 60%. The left ventricle has normal function. The left ventricle has no regional wall motion abnormalities. The left ventricular internal cavity size was normal in size. There is  severe asymmetric left ventricular hypertrophy of the basal-septal segment. Left ventricular diastolic function could not be evaluated due to paced rhythm. Left ventricular diastolic function could not be evaluated. Right Ventricle: The right ventricular size is normal. Right vetricular wall thickness was not well visualized. Right ventricular systolic function is normal. Left Atrium: Left atrial size was mildly dilated. Right Atrium: Right atrial size was mildly dilated. Pericardium: Trivial pericardial effusion is present. The pericardial effusion is circumferential. Presence of epicardial fat layer. Mitral Valve: The mitral valve is grossly normal. No evidence of mitral valve regurgitation. No evidence of mitral valve stenosis. Tricuspid Valve: The tricuspid valve is grossly normal. Tricuspid valve regurgitation is not demonstrated. No evidence of tricuspid stenosis. Aortic Valve: The aortic valve is grossly normal.  Aortic valve regurgitation is not visualized. No aortic stenosis is present. Pulmonic Valve: The pulmonic valve was not well visualized. Pulmonic valve regurgitation is not visualized. No evidence of pulmonic stenosis. Aorta: The aortic root and ascending aorta are structurally normal, with no evidence of dilitation. Venous: The inferior vena cava was not well visualized. IAS/Shunts: The atrial septum is grossly normal. Additional Comments: A device lead is visualized.  LEFT VENTRICLE PLAX 2D LVIDd:         5.20 cm LVIDs:         2.90 cm LV PW:         1.00 cm LV IVS:        1.70 cm LVOT diam:     2.20 cm LV SV:         77 LV SV Index:   33 LVOT Area:     3.80 cm  RIGHT VENTRICLE RV S prime:     17.40 cm/s TAPSE (M-mode): 2.0 cm LEFT ATRIUM           Index        RIGHT ATRIUM           Index LA diam:      3.90 cm 1.68 cm/m   RA Area:     21.40 cm LA Vol (A2C): 62.4 ml 26.89 ml/m  RA Volume:   64.50 ml  27.80 ml/m LA Vol (A4C): 79.4 ml 34.22 ml/m  AORTIC VALVE LVOT Vmax:   117.00 cm/s LVOT Vmean:  68.000 cm/s LVOT VTI:    0.203 m  AORTA Ao Root diam: 3.00 cm Ao Asc diam:  3.00 cm MITRAL VALVE MV Area (PHT): 3.31 cm     SHUNTS MV Decel Time: 229 msec     Systemic VTI:  0.20 m MV E velocity: 133.00 cm/s  Systemic Diam: 2.20 cm Buford Dresser MD Electronically signed by Buford Dresser MD Signature Date/Time: 02/26/2022/4:07:59 PM    Final    DG Chest Port 1 View  Result Date: 02/25/2022 CLINICAL DATA:  Shortness of breath EXAM: PORTABLE CHEST 1 VIEW COMPARISON:  12/29/2021 FINDINGS: Left-sided implanted cardiac device. Cardiomegaly. Aortic atherosclerosis. Pulmonary vascular congestion. Mild interstitial prominence. No pleural effusion or pneumothorax. IMPRESSION: Findings suggestive of CHF with mild edema. Electronically Signed   By: Davina Poke D.O.   On: 02/25/2022 15:06    Microbiology: Recent Results (from the past 240 hour(s))  Resp panel by RT-PCR (RSV, Flu A&B, Covid) Anterior  Nasal Swab     Status: None   Collection Time: 02/25/22  2:41 PM   Specimen: Anterior Nasal Swab  Result Value Ref Range Status   SARS Coronavirus 2 by RT PCR NEGATIVE NEGATIVE Final    Comment: (NOTE) SARS-CoV-2 target nucleic acids are NOT DETECTED.  The SARS-CoV-2 RNA is generally detectable in upper respiratory specimens during the acute phase of infection. The lowest concentration of SARS-CoV-2 viral copies this assay can detect is 138 copies/mL. A negative result does not preclude SARS-Cov-2 infection and should not be used as the sole basis for treatment or other patient management decisions. A negative result may occur with  improper specimen collection/handling, submission of specimen other than nasopharyngeal swab, presence of viral mutation(s) within the areas targeted by this assay, and inadequate number of viral copies(<138 copies/mL). A negative result must be combined with clinical observations, patient history, and epidemiological information. The expected result is Negative.  Fact Sheet for Patients:  EntrepreneurPulse.com.au  Fact Sheet for Healthcare Providers:  IncredibleEmployment.be  This test is no t yet approved or cleared by the [place] FDA and  has been authorized for detection and/or diagnosis of SARS-CoV-2 by FDA under an Emergency Use Authorization (EUA). This EUA will remain  in effect (meaning this test can be used) for the duration of the COVID-19 declaration under Section 564(b)(1) of the Act, 21 U.S.C.section 360bbb-3(b)(1), unless the authorization is terminated  or revoked sooner.       Influenza A by PCR NEGATIVE NEGATIVE Final   Influenza B by PCR NEGATIVE NEGATIVE Final    Comment: (NOTE) The Xpert Xpress SARS-CoV-2/FLU/RSV plus assay is intended as an aid in the diagnosis of influenza from Nasopharyngeal swab specimens and should not be used as a sole basis for treatment. Nasal washings  and aspirates are unacceptable for Xpert Xpress SARS-CoV-2/FLU/RSV testing.  Fact Sheet for Patients: EntrepreneurPulse.com.au  Fact Sheet for Healthcare Providers: IncredibleEmployment.be  This test is not yet approved or cleared by the [place] FDA and has been authorized for detection and/or diagnosis of SARS-CoV-2 by FDA under an Emergency Use Authorization (EUA). This EUA will remain in effect (meaning this test can be used) for the duration of the COVID-19 declaration under Section 564(b)(1) of the Act, 21 U.S.C. section 360bbb-3(b)(1), unless the authorization is terminated or revoked.     Resp Syncytial Virus by PCR NEGATIVE NEGATIVE Final    Comment: (NOTE)  Fact Sheet for Patients: EntrepreneurPulse.com.au  Fact Sheet for Healthcare Providers: IncredibleEmployment.be  This test is not yet approved or cleared by the [place] FDA and has been authorized for detection and/or diagnosis of SARS-CoV-2 by FDA under an Emergency Use Authorization (EUA). This EUA will remain in effect (meaning this test can be used) for the duration of the COVID-19 declaration under Section 564(b)(1) of the Act, 21 U.S.C. section 360bbb-3(b)(1), unless the authorization is terminated or revoked.  Performed at [facility], [place] [address], [place], [place] [address]      Labs: Basic Metabolic Panel: Recent Labs  Lab 02/27/22 1555 02/28/22 0503 03/01/22 0454 03/02/22 0518 03/03/22 0446 03/04/22 0526 03/05/22 0417  NA  --    < > 139 136 137 137 137  K  --    < > 4.0 4.5 4.3 4.7 4.4  CL  --    < > 103 102 102 105 101  CO2  --    < > 24 24 24 22 24  $ GLUCOSE  --    < > 149* 178* 131* 158* 151*  BUN  --    < > 37* 39* 48* 40* 39*  CREATININE  --    < > 2.73* 2.62* 2.49* 1.84* 1.83*  CALCIUM  --    < > 8.9 8.7* 9.0 9.4 9.7  MG 2.0  --   --   --   --   --   --   PHOS  --   --   --   --    --  3.7  --    < > = values in this interval not displayed.   Liver Function Tests: Recent Labs  Lab 03/04/22 0526  ALBUMIN 3.2*   No results for input(s): "LIPASE", "AMYLASE" in the last 168 hours. Recent Labs  Lab 02/26/22 1246  AMMONIA 11   CBC: Recent Labs  Lab 02/27/22 0451 02/28/22 0503 03/01/22 0454 03/02/22 0518 03/03/22 0446  WBC 12.4* 15.4* 13.5* 12.3* 12.7*  NEUTROABS  --   --  9.4* 7.8* 8.2*  HGB 9.5* 10.2* 9.8* 9.7* 9.3*  HCT 30.7* 34.4* 33.4* 32.8* 31.6*  MCV 85.8 89.4 90.3 90.1 89.8  PLT 291 409* 341 366 391   Cardiac Enzymes: No results for input(s): "CKTOTAL", "CKMB", "CKMBINDEX", "TROPONINI" in the last 168 hours. BNP: BNP (last 3 results) Recent Labs    12/29/21 2045 02/22/22 1659 02/25/22 1420  BNP 356.0* 489.1* 690.8*    ProBNP (last 3 results) No results for input(s): "PROBNP" in the last 8760 hours.  CBG: Recent Labs  Lab 03/04/22 0726 03/04/22 1157 03/04/22 1643 03/04/22 2130 03/05/22 0735  GLUCAP 136* 138* 143* 129* 131*       Signed:  Nita Sells MD   [facility] 03/05/2022, 9:26 AM

## 2022-03-05 NOTE — Telephone Encounter (Signed)
I sent the following message to Sueanne Margarita who as best I can tell is her [facility] coordinator    Boris Lown covering for Dr [name] during her absence.  I think you are working with Ms Auzhane Slutsky  She was discharged from the hospital today.  She needs transportation for a follow up visit in our office as she needs labs and a repeat ECG.  Can you help   Thanks  Samella Parr MD

## 2022-03-05 NOTE — Care Management Important Message (Signed)
Important Message  Patient Details IM Letter given. Name: Abigail [place] MRN: [id number] Date of Birth: 1945/09/16   Medicare Important Message Given:  Yes     [name] 03/05/2022, 1:20 PM

## 2022-03-05 NOTE — Telephone Encounter (Signed)
Patient's son, [name], calls nurse line regarding follow up from recent hospitalization.   Patient is being discharged today with instructions to follow up with our office in one week. Per discharge note, patient will need repeat labs and EKG.   Son reports that he is not able to transfer patient into the car and get her into the office.   Patient is having to have ambulance ride home from hospital today.   Son was asking about virtual visit. Advised that due to patient needing EKG and labs, she would need to be seen in the office. He is requesting assistance on follow up and transportation services.   CCM referral may be beneficial for patient.   Will forward to PCP.   Talbot Grumbling, RN

## 2022-03-05 NOTE — Consult Note (Addendum)
[facility] Consult   Reason for Consult: History of schizophrenia, psychosis, refusing care and medications.  Referring Physician: Dr. Karleen Hampshire Patient Identification: Abigail Abigail [place] MRN:  [id number] Principal Diagnosis: Pulmonary edema Diagnosis:  Principal Problem:   Pulmonary edema Active Problems:   Obstructive sleep apnea   Slurred speech   Disorganized schizophrenia (Abigail Abigail [place])   Counseling and coordination of care   Goals of care, counseling/discussion   Acute on chronic congestive heart failure [facility])   Palliative care encounter   Total Time spent with patient: 1 hour  Subjective:   Abigail Abigail [facility] is a 77 y.o. female patient admitted with slurred speech, shortness of breath, and confusion.  Patient does have past medical history of acute on chronic systolic and diastolic heart failure, acute metabolic encephalopathy, elevated troponin, hypertension, CKD stage IV, COPD, intrarenal AAA atrial fibrillation, and diabetes.  Chart review shows patient has history of schizophrenia, symptoms consistent with mild cognitive impairment that has been progressively getting worse over the past 15 years.  On assessment today, Patient seen via face to face by this provider; chart reviewed and consulted with Dr. Lovette Cliche on 03/05/22.  On evaluation Abigail Abigail [place] spontaneously states,"can I go home?"  Patient sitting slumped over in hospital bed, faces this Probation officer. Patient is wearing nasal cannula and denies wearing her CPAP last night " They didn't bring it to me. "  Per report of son, she had decompensated over the last few months and some worsening confusion prior to onset of behavioral changes.   On assessment today, the patient states she feels better and is ready to go. Despite advising patient we were not in control of her discharge, she focused her whole reassessment on discharging home. She denies depression, anxiety, psychosis, mania. Patient son says " yes she is anxious  she been in the hospital and ready to go home. " Patient still declined anxiety symptoms on 2nd attempt.  Sh describes her mood as good. She denies feeling paranoid and her body language is consistent with this. She has no evident signs of responding to internal stimuli, external stimuli, delusions, ideas of reference, or first rank symptoms. She denies AVH, SI, HI.   She denies physical complaints. She identifies no new barriers to discharge at this time. She continues to have limited insight as it pertains to her need for ongoing oxygen support and complications associated with her refusing CPAP at night.  On exam she does not present with agitation, aggression, confusion or psychosis. She has not displayed any disruptive behaviors, combativeness or appeared dangerous to others since starting Haldol. She does not present with restlessness, tremors, ataxia at this time.  Considering improvement in behaviors, upcoming discharge, and verbalizing outpatient follow up will reduce Haldol 1m po BID prn at this time. Zyprexa 2.531mpo once prn was added for agitation, however was not administered.   In terms of current clinical presentation, patient continued to deny all aspects of ROS despite obvious SOB and WOB. She stated" Im fine" when do I get to go home. Im fine." Discussed with patient change in clinical status in which she attempts to sit up from her slumped over position, and states " I am fine." Son was also present in the room, he presented as guarded today and seemed to have no concerns. He states he will have his mother follow up with her outpatient provider and our team was not as familiar with her as her outpatient provider. All questions, comments and concerned were addressed  during this visit. Son(Abigail [place])  did ask about "who ordered the restraints ? Was that your call or primary team?' " He was advised I am unaware who ordered restraints but primary team is generally usually responsible for restraints.    HPI:  Abigail Abigail [place] is a 77 y.o. female with medical history significant of CHF, hypertension. Stage 4 CKD, COPD, GERD, atrial fibrillation on eliquis, psychosis,  SSS, s/p PPM, type 2 DM, gout.was brought in by her son for increased confusion, sob and slurred speech since one week . CXR showed mild CHF. CT head without contrast was ordered for evaluation of stroke but patient is refused the test. She continues to refuse medical care intermittently, refusing lab work, MRI of the brain to check for stroke and dementia. She is currently getting IV lasix for her CHF.    Past Psychiatric History: Patient denies, however family at bedside endorses history of schizophrenia.  Son states patient is under the care of Abigail Abigail [facility].  She is currently being managed with Depakote 250 mg p.o. nightly.  She notably was taken olanzapine 7.5 mg p.o. nightly, however last filled history unknown does not appear to be compliant with such.  Risk to Self:  Denies Risk to Others:  Denies Prior Inpatient Therapy:  Denies Prior Outpatient Therapy:  Abigail Abigail [facility] per son  Past Medical History:  Past Medical History:  Diagnosis Date   Abdominal wall hernia 01/2017   Advanced care planning/counseling discussion 09/29/2021   Atrial fibrillation (HCC)    CHF (congestive heart failure) ([place])    CKD (chronic kidney disease)    COPD (chronic obstructive pulmonary disease) ([name])    Coronary artery disease    NON-CRITICAL   Ectasis aorta (HCC)    GERD (gastroesophageal reflux disease)    Gout    Hypertension    Memory difficulties 12/02/2013   Pacemaker    Psychosis ([place])    Tardive dyskinesia 12/02/2013   Type 2 diabetes mellitus ([place])     Past Surgical History:  Procedure Laterality Date   CARDIAC CATHETERIZATION  2007   Non-critical.    CARDIOVERSION N/A 01/27/2021   Procedure: CARDIOVERSION;  Surgeon: Geralynn Rile, MD;  Location: [facility];  Service: Cardiovascular;  Laterality: N/A;    CHOLECYSTECTOMY     ELBOW SURGERY     EP IMPLANTABLE DEVICE N/A 08/31/2014   Procedure: PPM Generator Changeout;  Surgeon: Sanda Klein, MD;  Location: [facility];  Service: Cardiovascular;  Laterality: N/A;   INSERT / REPLACE / REMOVE PACEMAKER  2007   [place]/SYMPTOMATIC HEART BLOCK   PACEMAKER PLACEMENT  09/28/2005   2/2 SSS?   Persantine stress test  05/01/2010   EF 66%. Normal LV sys fx. Unchanged from previous studies.    TRANSTHORACIC ECHOCARDIOGRAM  09/08/10   SEVERE CONCENTRIC HYPERTROPHY.LV FUNCTION WAS VIGOROUS.EF 65%-70%.VENTRICULAR SEPTUM-INCOORDINATE MOTION.LEFT ATRIUM-MILDLY DILATED.TRIVIAL TR.   TUBAL LIGATION     Family History:  Family History  Problem Relation Age of Onset   Heart disease Mother    Heart disease Father    Stroke Sister    Diabetes type II Son    Family Psychiatric  History: Unable to assess Social History:  Social History   Substance and Sexual Activity  Alcohol Use No   Alcohol/week: 0.0 standard drinks of alcohol     Social History   Substance and Sexual Activity  Drug Use No    Social History   Socioeconomic History   Marital status: Divorced  Spouse name: Not on file   Number of children: 3   Years of education: 50   Highest education level: Associate degree: academic program  Occupational History   Not on file  Tobacco Use   Smoking status: Every Day    Types: Cigarettes    Last attempt to quit: 03/15/2021    Years since quitting: 0.9   Smokeless tobacco: Never   Tobacco comments:    1cig a day  Vaping Use   Vaping Use: Former  Substance and Sexual Activity   Alcohol use: No    Alcohol/week: 0.0 standard drinks of alcohol   Drug use: No   Sexual activity: Not Currently    Partners: Male    Birth control/protection: Post-menopausal    Comment: BTL  Other Topics Concern   Not on file  Social History Narrative   Patient lives with her son in [place] in a one story apartment.    Son is patients  caregiver (Abigail [place].)    Patient is wheelchair bound, except for bedtime.    Patient enjoys listing to music and visiting family.    Social Determinants of Health   Financial Resource Strain: Low Risk  (12/27/2020)   Overall Financial Resource Strain (CARDIA)    Difficulty of Paying Living Expenses: Not hard at all  Food Insecurity: No Food Insecurity (02/25/2022)   Hunger Vital Sign    Worried About Running Out of Food in the Last Year: Never true    Ran Out of Food in the Last Year: Never true  Transportation Needs: No Transportation Needs (02/25/2022)   PRAPARE - Hydrologist (Medical): No    Lack of Transportation (Non-Medical): No  Physical Activity: Inactive (12/27/2020)   Exercise Vital Sign    Days of Exercise per Week: 0 days    Minutes of Exercise per Session: 0 min  Stress: No Stress Concern Present (12/27/2020)   [place]    Feeling of Stress : Only a little  Social Connections: Socially Isolated (12/27/2020)   Social Connection and Isolation Panel [NHANES]    Frequency of Communication with Friends and Family: More than three times a week    Frequency of Social Gatherings with Friends and Family: More than three times a week    Attends Religious Services: Never    Marine scientist or Organizations: No    Attends Archivist Meetings: Never    Marital Status: Divorced   Additional Social History:    Allergies:   Allergies  Allergen Reactions   Aripiprazole Anaphylaxis, Other (See Comments) and Shortness Of Breath    Tremors   Clozapine Anaphylaxis   Benadryl [Diphenhydramine Hcl] Other (See Comments)    States affects her vision   Benztropine Other (See Comments)    Affects mobility   Latuda [Lurasidone Hcl] Other (See Comments)    "Tremors and shakes."    Lorazepam Other (See Comments)    Unknown reaction??   Remeron [Mirtazapine] Other (See Comments)     Tremors and shakes   Haloperidol Lactate Other (See Comments)    Unknown reaction??   Keflex [Cephalexin] Swelling and Other (See Comments)    Per patient, has tongue swelling with keflex but can tolerate amoxicillin   Codeine Other (See Comments)    Unknown reaction??   Latex Rash    Labs:  Results for orders placed or performed during the hospital encounter of 02/25/22 (from the past 48  hour(s))  Glucose, capillary     Status: Abnormal   Collection Time: 03/03/22  4:26 PM  Result Value Ref Range   Glucose-Capillary 169 (H) 70 - 99 mg/dL    Comment: Glucose reference range applies only to samples taken after fasting for at least 8 hours.  Glucose, capillary     Status: Abnormal   Collection Time: 03/03/22  8:22 PM  Result Value Ref Range   Glucose-Capillary 139 (H) 70 - 99 mg/dL    Comment: Glucose reference range applies only to samples taken after fasting for at least 8 hours.  Renal function panel     Status: Abnormal   Collection Time: 03/04/22  5:26 AM  Result Value Ref Range   Sodium 137 135 - 145 mmol/L   Potassium 4.7 3.5 - 5.1 mmol/L   Chloride 105 98 - 111 mmol/L   CO2 22 22 - 32 mmol/L   Glucose, Bld 158 (H) 70 - 99 mg/dL    Comment: Glucose reference range applies only to samples taken after fasting for at least 8 hours.   BUN 40 (H) 8 - 23 mg/dL   Creatinine, Ser 1.84 (H) 0.44 - 1.00 mg/dL   Calcium 9.4 8.9 - 10.3 mg/dL   Phosphorus 3.7 2.5 - 4.6 mg/dL   Albumin 3.2 (L) 3.5 - 5.0 g/dL   GFR, Estimated 28 (L) >60 mL/min    Comment: (NOTE) Calculated using the CKD-EPI Creatinine Equation (2021)    Anion gap 10 5 - 15    Comment: Performed at [facility], [place] [address], [place], [place] [address]  Glucose, capillary     Status: Abnormal   Collection Time: 03/04/22  7:26 AM  Result Value Ref Range   Glucose-Capillary 136 (H) 70 - 99 mg/dL    Comment: Glucose reference range applies only to samples taken after fasting for at least 8  hours.  Glucose, capillary     Status: Abnormal   Collection Time: 03/04/22 11:57 AM  Result Value Ref Range   Glucose-Capillary 138 (H) 70 - 99 mg/dL    Comment: Glucose reference range applies only to samples taken after fasting for at least 8 hours.  Glucose, capillary     Status: Abnormal   Collection Time: 03/04/22  4:43 PM  Result Value Ref Range   Glucose-Capillary 143 (H) 70 - 99 mg/dL    Comment: Glucose reference range applies only to samples taken after fasting for at least 8 hours.  Glucose, capillary     Status: Abnormal   Collection Time: 03/04/22  9:30 PM  Result Value Ref Range   Glucose-Capillary 129 (H) 70 - 99 mg/dL    Comment: Glucose reference range applies only to samples taken after fasting for at least 8 hours.  Basic metabolic panel     Status: Abnormal   Collection Time: 03/05/22  4:17 AM  Result Value Ref Range   Sodium 137 135 - 145 mmol/L   Potassium 4.4 3.5 - 5.1 mmol/L   Chloride 101 98 - 111 mmol/L   CO2 24 22 - 32 mmol/L   Glucose, Bld 151 (H) 70 - 99 mg/dL    Comment: Glucose reference range applies only to samples taken after fasting for at least 8 hours.   BUN 39 (H) 8 - 23 mg/dL   Creatinine, Ser 1.83 (H) 0.44 - 1.00 mg/dL   Calcium 9.7 8.9 - 10.3 mg/dL   GFR, Estimated 28 (L) >60 mL/min    Comment: (NOTE) Calculated  using the CKD-EPI Creatinine Equation (2021)    Anion gap 12 5 - 15    Comment: Performed at [facility], [place] [address], [place], [place] [address]  Glucose, capillary     Status: Abnormal   Collection Time: 03/05/22  7:35 AM  Result Value Ref Range   Glucose-Capillary 131 (H) 70 - 99 mg/dL    Comment: Glucose reference range applies only to samples taken after fasting for at least 8 hours.  Glucose, capillary     Status: Abnormal   Collection Time: 03/05/22 11:27 AM  Result Value Ref Range   Glucose-Capillary 172 (H) 70 - 99 mg/dL    Comment: Glucose reference range applies only to samples taken after  fasting for at least 8 hours.   *Note: Due to a large number of results and/or encounters for the requested time period, some results have not been displayed. A complete set of results can be found in Results Review.    Current Facility-Administered Medications  Medication Dose Route Frequency Provider Last Rate Last Admin   acetaminophen (TYLENOL) tablet 1,000 mg  1,000 mg Oral Q6H PRN Hosie Poisson, MD   1,000 mg at 03/05/22 1012   allopurinol (ZYLOPRIM) tablet 100 mg  100 mg Oral Daily Hosie Poisson, MD   100 mg at 03/05/22 0857   amLODipine (NORVASC) tablet 10 mg  10 mg Oral Daily Hosie Poisson, MD   10 mg at 03/05/22 0858   apixaban (ELIQUIS) tablet 5 mg  5 mg Oral BID Hosie Poisson, MD   5 mg at 03/05/22 [id number]   Chlorhexidine Gluconate Cloth 2 % PADS 6 each  6 each Topical Daily Nita Sells, MD   6 each at 03/05/22 1214   divalproex (DEPAKOTE) DR tablet 250 mg  250 mg Oral Q12H Suella Broad, FNP   250 mg at 03/05/22 0857   haloperidol (HALDOL) tablet 2 mg  2 mg Oral BID PRN Suella Broad, FNP       hydrALAZINE (APRESOLINE) tablet 25 mg  25 mg Oral Q8H PRN Hosie Poisson, MD   25 mg at 03/05/22 [id number]   insulin aspart (novoLOG) injection 0-9 Units  0-9 Units Subcutaneous TID WC Hosie Poisson, MD   2 Units at 03/05/22 1227   ipratropium-albuterol (DUONEB) 0.5-2.5 (3) MG/3ML nebulizer solution 3 mL  3 mL Nebulization Q6H PRN Hosie Poisson, MD   3 mL at 03/04/22 2103   metoprolol tartrate (LOPRESSOR) tablet 25 mg  25 mg Oral BID Nita Sells, MD   25 mg at 03/05/22 0857   multivitamin with minerals tablet 1 tablet  1 tablet Oral Daily Hosie Poisson, MD   1 tablet at 03/05/22 0857   polyvinyl alcohol (LIQUIFILM TEARS) 1.4 % ophthalmic solution 1 drop  1 drop Both Eyes PRN Nita Sells, MD   1 drop at 03/03/22 1748   pravastatin (PRAVACHOL) tablet 40 mg  40 mg Oral QPM Hosie Poisson, MD   40 mg at 03/04/22 1822   senna (SENOKOT) tablet 8.6 mg  1 tablet  Oral Daily PRN Hosie Poisson, MD   8.6 mg at 03/04/22 0830   umeclidinium-vilanterol (ANORO ELLIPTA) 62.5-25 MCG/ACT 1 puff  1 puff Inhalation Daily Hosie Poisson, MD   1 puff at 03/05/22 [id number]    Musculoskeletal: Strength & Muscle Tone: decreased [place]:  Unable to assess patient lying in bed Patient leans: N/A   Psychiatric Specialty Exam:  Presentation  General Appearance:  Appropriate for Environment  Eye Contact: Fair  Speech: Clear and Coherent; Normal Rate  Speech Volume: Normal  Handedness: Right   Mood and Affect  Mood: Euthymic; Euphoric  Affect: Appropriate; Blunt   Thought Process  Thought Processes: Coherent; Goal Directed  Descriptions of Associations:Intact  Orientation:Full (Time, Place and Person)  Thought Content:Logical  History of Schizophrenia/Schizoaffective disorder:Yes  Duration of Psychotic Symptoms:N/A  Hallucinations:No data recorded  Ideas of Reference:None  Suicidal Thoughts:No data recorded  Homicidal Thoughts:No data recorded   Sensorium  Memory: Immediate Fair; Recent Good; Remote Fair  Judgment: Fair  Insight: Fair   Community education officer  Concentration: Fair  Attention Span: Fair  Recall: [employer] of Knowledge: Fair  Language: Fair   Psychomotor Activity  Psychomotor Activity: No data recorded   Assets  Assets: Desire for Improvement; Physical Health; Communication Skills; Social Support   Sleep  Sleep: No data recorded   Physical Exam: Physical Exam Vitals and nursing note reviewed.  Constitutional:      General: She is awake.     Appearance: Normal appearance. She is obese.  Pulmonary:     Comments: Mild audible wheezing and increased WOB.  Skin:    Capillary Refill: Capillary refill takes less than 2 seconds.  Neurological:     General: No focal deficit present.     Mental Status: She is alert and oriented to person, place, and time.  Psychiatric:         Attention and Perception: Attention and perception normal.        Mood and Affect: Mood normal.        Speech: Speech is slurred.        Behavior: Behavior normal.        Thought Content: Thought content normal.        Judgment: Judgment normal.     Comments: Slight dysarthria noted however son was able to articulate what was being said. Unclear if this was due to shortness of breath.     Review of Systems  Psychiatric/Behavioral: Negative.    All other systems reviewed and are negative.  Blood pressure (!) 143/74, pulse 79, temperature 98 F (36.7 C), temperature source Oral, resp. rate 17, height 5' 6"$  (1.676 m), weight 125.8 kg, SpO2 97 %. Body mass index is 44.76 kg/m.  Treatment Plan Summary: Daily contact with patient to assess and evaluate symptoms and progress in treatment, Medication management, and Plan . Will adjust Haldol 40m po BID prn. Outpatient cardiologist Crioturo did review her EKG pre/post trial of Haldol. Determined to have no changes and resume therapy. Patients clinically improved over 7 days, and will be discharging soon. Will reduce Haldol 229mpo BID prn, son plans to follow up with outpatient provider JoEino Farberwho knows the patient well). Patient and son are both aware of risks, benefits, side effects profile and documented history of allergy, and agree to ongoing treatment until seen by outpatient provider, who will review and make appropriate medications.  Continue to limit use of multiple prn, will dc olanzapine prn order at this time.   -Will continue Depakote 250 mg p.o. twice daily to further target agitation and psychosis.     -Consider TOC consult for skilled nursing facility for rehabilitation.  -Will also need outpatient neurology consult.  If patient's symptoms continue to persist during hospitalization, may benefit from mocha/slums once medically stable.  There appear to be no psychiatric barriers that prohibit this patient from discharging home by  consult service. Patient will arrange after care with her psychiatric provider.  Psychiatry consult service to sign off at this time.  Disposition: No evidence of imminent risk to self or others at present.   Patient does not meet criteria for psychiatric inpatient admission. Supportive therapy provided about ongoing stressors. Discussed crisis plan, support from social network, calling 911, coming to the Emergency Department, and calling Suicide Hotline.  Suella Broad, FNP 03/05/2022 1:14 PM

## 2022-03-05 NOTE — Progress Notes (Signed)
  Signed      SATURATION QUALIFICATIONS: (This note is used to comply with regulatory documentation for home oxygen)   Patient Saturations on Room Air at Rest = 88%   Patient Saturations on Room Air while Ambulating= patient is bed bound   Patient Saturations on 2 Liters of oxygen while Ambulating =patient is bed bound   Please briefly explain why patient needs home oxygen: Patient desaturates on RA needs Oxygen to maintain Saturations above 90%

## 2022-03-05 NOTE — TOC Transition Note (Addendum)
Transition of Care [facility]) - CM/SW Discharge Note   Patient Details  Name: Abigail Wallace MRN: [id number] Date of Birth: 1945-07-17  [facility]) CM/SW Contact:  Dessa Phi, RN Phone Number: 03/05/2022, 10:25 AM   Clinical Narrative: spoke to son ronald-d/c plans home ww/HHC-[facility] rep Levada Dy aware of HHRN/PT/OT;otpt palliative Care services-leave list in rm for son [name] to take home & choose on own, & call-voiced understanding.home 02-[facility] chosen-to deliver 02 set up to home. [facility] for transport. No further CM needs.  -10:41a-[facility] rep Levada Dy to confirm if can manage HHRN-f/c instruction.awaiting doucmented 02 also.  -spoke to son [name] in agreement to d/c without HHRN-rep Levada Dy w/[facility] aware of only HHPT/OT to be provided.ptpt palliative care service list in rm.MD notified. -12:14p-awaiting delivery home 02[facility] set up prior d/c by [facility] home.MD/nsg aware. -1:33p-otpt Palliative care referral [facility]-per son [name] request sent rep will contact [place] @once$  home. Awaiting home 02 to be delivered to home. [name] will go home to meet [facility] for home 02 to be delivered now.[facility] called for scheduled 2p pick up. No further CM needs.    Final next level of care: Home w Home Health Services Barriers to Discharge: No Barriers Identified   Patient Goals and CMS Choice CMS Medicare.gov Compare Post Acute Care list provided to:: Patient Represenative (must comment) Choice offered to / list presented to : Adult Children  Discharge Placement                         Discharge Plan and Services Additional resources added to the After Visit Summary for     Discharge Planning Services: CM Consult Post Acute Care Choice: Durable Medical Equipment, Home Health          DME Arranged: Oxygen DME Agency: [facility] Date DME Agency Contacted: 03/05/22 Time DME Agency Contacted: 1024 Representative spoke with at DME Agency:  Erasmo Downer HH Arranged: RN, PT, OT [facility] Agency: [place] Date [place]: 03/05/22 Time [place]: 1024 Representative spoke with at [place]: [place] ([place]) Interventions SDOH Screenings   Food Insecurity: No Food Insecurity (02/25/2022)  Housing: Low Risk  (02/25/2022)  Transportation Needs: No Transportation Needs (02/25/2022)  Utilities: Not At Risk (02/25/2022)  Alcohol Screen: Low Risk  (12/27/2020)  Depression (PHQ2-9): High Risk (02/22/2022)  Financial Resource Strain: Low Risk  (12/27/2020)  Physical Activity: Inactive (12/27/2020)  Social Connections: Socially Isolated (12/27/2020)  Stress: No Stress Concern Present (12/27/2020)  Tobacco Use: High Risk (03/02/2022)     Readmission Risk Interventions     No data to display

## 2022-03-05 NOTE — Progress Notes (Signed)
SATURATION QUALIFICATIONS: (This note is used to comply with regulatory documentation for home oxygen)  Patient Saturations on Room Air at Rest = 88%  Patient Saturations on Room Air while Ambulating= patient is bed bound  Patient Saturations on 2 Liters of oxygen while Ambulating =patient is bed bound  Please briefly explain why patient needs home oxygen: Patient desaturates on RA needs Oxygen to maintain Saturations above 90%

## 2022-03-06 ENCOUNTER — Telehealth: Payer: Self-pay | Admitting: *Deleted

## 2022-03-06 ENCOUNTER — Telehealth: Payer: Self-pay

## 2022-03-06 NOTE — Patient Outreach (Signed)
  Care Coordination   Initial Visit Note   03/06/2022 Name: Abigail Wallace MRN: [id number] DOB: 08/31/1945  Abigail Wallace is a 77 y.o. year old female who sees Martyn Malay, MD for primary care. I spoke with  Selena Lesser, son of Abigail Wallace by phone today.  What matters to the patients health and wellness today?  Trying to decide what level of care my mother should be under. Palliative Care and Hospice are being considered. They will come do a home assessment tomorrow.    Goals Addressed             This Visit's Progress    What should my mohter's level of care be? Find out this week.ient Stated       Interventions Today    Flowsheet Row Most Recent Value  Chronic Disease   Chronic disease during today's visit Congestive Heart Failure (CHF)  General Interventions   General Interventions Discussed/Reviewed General Interventions Reviewed  [Discussed HF disease trajectory. Pt hospitalized 2X in 3 months for same.]  Exercise Interventions   Exercise Discussed/Reviewed Physical Activity  [Mostly bedbound. Has stage II pressure or sheer wound on coccyx.]  Physical Activity Discussed/Reviewed Physical Activity Discussed, Physical Activity Reviewed  [Discussed importance of turning every 2-4 hours, keep pt dry, Use a barrier cream on affected area. Avoid sheering movements when turning and repositioning. Use pillows to support back and keep her on her side and off broken down area. Monitor hips.]  Education Interventions   Education Provided Provided Education  Mental Health Interventions   Mental Health Discussed/Reviewed Coping Strategies, Anxiety  [Listened to son, empathisized with his situation, supported him. Made some suggestions: Wirite all your questions down to discuss with nurses or MD. Allow others to relieve you.]  Advanced Directive Interventions   Advanced Directives Discussed/Reviewed Advanced Directives Discussed  [Discussed current full code status in mother's  scenario with declining health. Talked about quality of life, Consideration of natural death if aggresive interentions would make her more disabled or cause discomfort. Listen to Palliative/Hospice .staff.]              SDOH assessments and interventions completed:  No  Care Coordination Interventions:  Yes, provided   Follow up plan: Follow up call scheduled for Thursday    Encounter Outcome:  Pt. Visit Completed   Kayleen Memos C. Myrtie Neither, MSN, [facility] Gerontological Nurse Practitioner [facility] Care Management [phone]

## 2022-03-06 NOTE — Transitions of Care (Post Inpatient/ED Visit) (Addendum)
   03/06/2022  Name: Denyla Tam MRN: [id number] DOB: Nov 01, 1945  Today's TOC FU Call Status: Today's TOC FU Call Status:: Successful TOC FU Call Competed  Transition Care Management Follow-up Telephone Call Date of Discharge: 03/05/22 Discharge Facility: [facility]) Type of Discharge: Inpatient Admission Primary Inpatient Discharge Diagnosis:: Acute on Chronic Congestive Heart Failure How have you been since you were released from the hospital?: Better Any questions or concerns?: Yes Patient Questions/Concerns:: Patient's son has question about Nitroglycerin and why it was discontinued Patient Questions/Concerns Addressed: Notified Provider of Patient Questions/Concerns  Items Reviewed: Did you receive and understand the discharge instructions provided?: Yes Medications obtained and verified?: Yes (Medications Reviewed) Any new allergies since your discharge?: No Dietary orders reviewed?: Yes Type of Diet Ordered:: Diabetic Heart Healthy Do you have support at home?: Yes People in Home: child(ren), dependent Name of Support/Comfort Primary Source: Jori Moll, son is primary caregiver  Home Care and Equipment/Supplies: [place] Ordered?: Yes Name of [place]:: [employer] Any new equipment or medical supplies ordered?: Yes Name of Medical supply agency?: [facility]) Were you able to get the equipment/medical supplies?: Yes Do you have any questions related to the use of the equipment/supplies?: No  Functional Questionnaire: Do you need assistance with bathing/showering or dressing?: Yes Do you need assistance with meal preparation?: Yes Do you need assistance with eating?: Yes Do you have difficulty maintaining continence: Yes Do you need assistance with getting out of bed/getting out of a chair/moving?: Yes Do you have difficulty managing or taking your medications?: Yes  Folllow up appointments reviewed: PCP Follow-up  appointment confirmed?: Yes Date of PCP follow-up appointment?: 03/15/22 (Discussed case with [facility] NP Deloria Lair, she is going to contact patient's son and may set up home visit to discuss patient chronic medical issues.) Follow-up Provider: [facility] for blood draw and  [facility] Follow-up appointment confirmed?: NA Do you need transportation to your follow-up appointment?: Yes Transportation Need Intervention Addressed By:: AMB Referral For SDOH Needs Do you understand care options if your condition(s) worsen?: Yes-patient verbalized understanding  SDOH Interventions Today    Flowsheet Row Most Recent Value  SDOH Interventions   Food Insecurity Interventions Intervention Not Indicated  Housing Interventions Intervention Not Indicated      Johnney Killian, RN, BSN, CCM Care Management Coordinator [facility] Phone: [phone]: [phone]

## 2022-03-07 ENCOUNTER — Telehealth: Payer: Self-pay | Admitting: Family Medicine

## 2022-03-08 ENCOUNTER — Encounter: Payer: Self-pay | Admitting: Family Medicine

## 2022-03-08 ENCOUNTER — Telehealth: Payer: Self-pay | Admitting: *Deleted

## 2022-03-08 ENCOUNTER — Telehealth: Payer: Self-pay

## 2022-03-08 DIAGNOSIS — E1165 Type 2 diabetes mellitus with hyperglycemia: Secondary | ICD-10-CM

## 2022-03-08 NOTE — Patient Outreach (Signed)
  Care Coordination   03/08/2022 Name: Abigail Wallace MRN: [id number] DOB: [birth year]   Care Coordination Outreach Attempts:  An unsuccessful telephone outreach was attempted today to offer the patient information about available care coordination services as a benefit of their health plan.  2 calls were placed today and a text message was sent to her son to return my call.  Follow Up Plan:  Additional outreach attempts will be made to offer the patient care coordination information and services.   Encounter Outcome:  No Answer   Care Coordination Interventions:  No, not indicated    SIG Keefe Zawistowski C. Myrtie Neither, MSN, [facility] Gerontological Nurse Practitioner [facility] Care Management [phone]

## 2022-03-08 NOTE — Telephone Encounter (Signed)
Spoke with Son  Bradd Canary - he will contact her about transportation to the [facility]  Not started Physical Therapy or OT or skilled therapy   Keeping catheter clean   Aide comes 3 hours a day   Contacted Palliative Care

## 2022-03-08 NOTE — Telephone Encounter (Signed)
Patients son calls nurse line for several concerns.   (1) He request a refill on glucometer. He reports she is testing TID. I have pended this.   (2) He is requesting a DME order for [employer] to clean patients catheters.   (3) He reports the sore on her "backside" has opened up. He reports draining, however reports this has subsided. He denies any odors or fevers. Advised to keep the area clean and dry. Advised clinic visit if symptoms or worsen or do not improve.  Will forward to PCP.

## 2022-03-08 NOTE — Patient Outreach (Signed)
  Care Coordination   Follow Up Visit Note   03/08/2022 Name: Abigail Wallace MRN: [id number] DOB: 1945-08-16  Abigail Wallace is a 77 y.o. year old female who sees Martyn Malay, MD for primary care. I spoke with pt son Abigail Wallace.  What matters to the patients health and wellness today?  Getting home health to begin services. Son has consented to Palliative Care services but they may not begin until April.    Goals Addressed   None     SDOH assessments and interventions completed:  No  Care Coordination Interventions:  Yes, provided    Follow up plan: Follow up call scheduled for HOME VISIT ON TUESDAY AT 10 AM.  NP AGREED TO FOLLOW PT [place].  Encounter Outcome:  Pt. Visit Completed   Kayleen Memos C. Myrtie Neither, MSN, [facility] Gerontological Nurse Practitioner [facility] Care Management [phone]

## 2022-03-09 ENCOUNTER — Encounter: Payer: 59 | Admitting: *Deleted

## 2022-03-09 MED ORDER — ONETOUCH VERIO W/DEVICE KIT: PACK | 0 refills | Status: DC

## 2022-03-12 ENCOUNTER — Other Ambulatory Visit: Payer: Self-pay | Admitting: Cardiovascular Disease

## 2022-03-12 ENCOUNTER — Other Ambulatory Visit: Payer: Self-pay | Admitting: Family Medicine

## 2022-03-12 ENCOUNTER — Encounter: Payer: Self-pay | Admitting: Cardiovascular Disease

## 2022-03-12 DIAGNOSIS — I5042 Chronic combined systolic (congestive) and diastolic (congestive) heart failure: Secondary | ICD-10-CM

## 2022-03-12 DIAGNOSIS — Z978 Presence of other specified devices: Secondary | ICD-10-CM

## 2022-03-12 NOTE — Telephone Encounter (Signed)
Covering for Dr [name] this week starting today,  Looks like glucometer already been signed for. DME ordered for wipes and message sent to RN staff.   Has appt with Dr Nita Sells on 2/28 to assess sore on backside, copied on this message. Agree with return precautions given.  Abigail Savannah MD

## 2022-03-12 NOTE — Progress Notes (Signed)
DME ordered Medwipes per patient family request.

## 2022-03-12 NOTE — Telephone Encounter (Signed)
[name] calls nurse line requesting to change 2/28 apt with Nita Sells to virtual.  He reports she has several apt this week and he feels she will not be up for another visit. He reports it is challenging getting her in and out of the car.   He reports he would like to discuss her wounds with Dr. Nita Sells. He reports he would be able to show her via virtual visit.   He denies any fevers or chills. Denies any new onset of confusion. No malodorous discharge.   ED precautions given to son for infection.   Will forward to [place].

## 2022-03-13 ENCOUNTER — Other Ambulatory Visit: Payer: Self-pay | Admitting: Family Medicine

## 2022-03-13 ENCOUNTER — Telehealth: Payer: Self-pay

## 2022-03-13 ENCOUNTER — Ambulatory Visit: Payer: Self-pay | Admitting: *Deleted

## 2022-03-13 NOTE — Telephone Encounter (Signed)
Abigail Wallace returns my call to nurse line.   He reports she will not be able to come into the office today. He reports they have an apt with PCP on 3/22. He reports "she will have to wait until then for lab work."   He is requesting home health for PT and nursing. Unsure, if these orders were placed during hospital stay. He reports he was told by a [facility] NP they were not.   Will forward to PCP.

## 2022-03-13 NOTE — Telephone Encounter (Signed)
(  12:57 pm) PC SW scheduled an initial palliative care visit for patient with her son. Palliative care clinical team is scheduled to see patient on  03/19/22 @ 12pm.

## 2022-03-13 NOTE — Patient Outreach (Signed)
  Care Coordination   Home Visit Note   03/13/2022 Name: Alantra Gaylord MRN: [id number] DOB: [birth year]  Kyila Napora Berdine Addison is a 77 y.o. year old female who sees Martyn Malay, MD for primary care. I visited  [place] in their home today.  What matters to the patients health and wellness today?  Getting more hands on care. (Per son)  New dx: CHF BP (!) 168/76   Pulse 70   Resp 20  Heart RRR Lungs are clear Extremities: R leg smaller than left (hx polio) no edema.  Had foley catheter. Pt reports it is uncomfortable. She wants it replaced. Advised unless there is a problem with it the usual changing schedule is once a month. Educated about increased changes can introduce more bacteria. Educated that she will have bacteria in her urine. We only treat if she has systemic sxs. Educated son on how to readjust the catheter and demonstrated this for him. Talked about making sure the catheter does not get pulled.    Goals Addressed             This Visit's Progress    What should my mohter's level of care be? Find out this week.ient Stated   On track    Interventions Today    Flowsheet Row Most Recent Value  Chronic Disease   Chronic disease during today's visit Congestive Heart Failure (CHF)  General Interventions   General Interventions Discussed/Reviewed General Interventions Discussed  [Son acknowledges he would like all the help he can get. He is really wanting more custodial care to relieve him of his 24/7  responsibility. They have a PCA 3 hours, 5 days a week. Educated sont on what Medicare will pay. Gave resource for addl help..]  Education Interventions   Education Provided Provided Education  Provided Verbal Education On Other  [Foley cath care and NP adjusted cath with son watching.]  Mental Health Interventions   Mental Health Discussed/Reviewed Coping Strategies  [Encouraged son to get out when PCA is at their home.]      Pt can be at home as long as there is  help part of the day and at night. She is really nursing level of care. Son does not wish to consider this at this time. After this home assessment, [place], [facility], will call to evaluate eligbility.         SDOH assessments and interventions completed:  Yes   preciously assessed.  Care Coordination Interventions:  Yes, provided   Follow up plan: Follow up call scheduled for 2 weeks.    Encounter Outcome:  Pt. Visit Completed   Kayleen Memos C. Myrtie Neither, MSN, [facility] Gerontological Nurse Practitioner [facility] Care Management [phone]

## 2022-03-13 NOTE — Telephone Encounter (Signed)
Attempted to call Abigail Wallace, however no answer.   VM left to return my call.

## 2022-03-14 ENCOUNTER — Ambulatory Visit: Payer: 59 | Admitting: Family Medicine

## 2022-03-15 ENCOUNTER — Ambulatory Visit (HOSPITAL_COMMUNITY)
Admission: RE | Admit: 2022-03-15 | Discharge: 2022-03-15 | Disposition: A | Discharge status: Home / Self Care | Payer: 59 | Source: Ambulatory Visit | Attending: Family Medicine | Admitting: Family Medicine

## 2022-03-15 ENCOUNTER — Ambulatory Visit: Payer: 59

## 2022-03-15 ENCOUNTER — Other Ambulatory Visit: Payer: 59

## 2022-03-15 ENCOUNTER — Other Ambulatory Visit: Payer: Self-pay | Admitting: Family Medicine

## 2022-03-15 ENCOUNTER — Other Ambulatory Visit: Payer: Self-pay

## 2022-03-15 DIAGNOSIS — N179 Acute kidney failure, unspecified: Secondary | ICD-10-CM

## 2022-03-15 DIAGNOSIS — I5042 Chronic combined systolic (congestive) and diastolic (congestive) heart failure: Secondary | ICD-10-CM

## 2022-03-15 DIAGNOSIS — N189 Chronic kidney disease, unspecified: Secondary | ICD-10-CM | POA: Diagnosis not present

## 2022-03-15 DIAGNOSIS — E1122 Type 2 diabetes mellitus with diabetic chronic kidney disease: Secondary | ICD-10-CM | POA: Diagnosis not present

## 2022-03-15 DIAGNOSIS — I2510 Atherosclerotic heart disease of native coronary artery without angina pectoris: Secondary | ICD-10-CM | POA: Insufficient documentation

## 2022-03-15 DIAGNOSIS — I480 Paroxysmal atrial fibrillation: Secondary | ICD-10-CM

## 2022-03-15 DIAGNOSIS — N1832 Chronic kidney disease, stage 3b: Secondary | ICD-10-CM

## 2022-03-15 DIAGNOSIS — I509 Heart failure, unspecified: Secondary | ICD-10-CM | POA: Insufficient documentation

## 2022-03-15 DIAGNOSIS — J449 Chronic obstructive pulmonary disease, unspecified: Secondary | ICD-10-CM | POA: Diagnosis not present

## 2022-03-15 DIAGNOSIS — I130 Hypertensive heart and chronic kidney disease with heart failure and stage 1 through stage 4 chronic kidney disease, or unspecified chronic kidney disease: Secondary | ICD-10-CM | POA: Insufficient documentation

## 2022-03-15 DIAGNOSIS — Z950 Presence of cardiac pacemaker: Secondary | ICD-10-CM | POA: Insufficient documentation

## 2022-03-15 LAB — ECHOCARDIOGRAM COMPLETE
AR max vel: 2.88 cm2
AV Area VTI: 2.29 cm2
AV Area mean vel: 2.29 cm2
AV Mean grad: 3 mmHg
AV Peak grad: 5.2 mmHg
Ao pk vel: 1.15 m/s
Area-P 1/2: 3.77 cm2
Calc EF: 41.1 %
S' Lateral: 3.7 cm
Single Plane A2C EF: 48.8 %
Single Plane A4C EF: 41.9 %

## 2022-03-16 ENCOUNTER — Encounter (HOSPITAL_BASED_OUTPATIENT_CLINIC_OR_DEPARTMENT_OTHER): Payer: Self-pay | Admitting: Emergency Medicine

## 2022-03-16 ENCOUNTER — Emergency Department (HOSPITAL_BASED_OUTPATIENT_CLINIC_OR_DEPARTMENT_OTHER)
Admission: EM | Admit: 2022-03-16 | Discharge: 2022-03-16 | Disposition: A | Discharge status: Home / Self Care | Payer: 59 | Attending: Emergency Medicine | Admitting: Emergency Medicine

## 2022-03-16 ENCOUNTER — Telehealth: Payer: Self-pay | Admitting: Family Medicine

## 2022-03-16 ENCOUNTER — Other Ambulatory Visit: Payer: Self-pay

## 2022-03-16 ENCOUNTER — Encounter: Payer: Self-pay | Admitting: Family Medicine

## 2022-03-16 ENCOUNTER — Telehealth: Payer: Self-pay | Admitting: *Deleted

## 2022-03-16 DIAGNOSIS — T83091A Other mechanical complication of indwelling urethral catheter, initial encounter: Secondary | ICD-10-CM | POA: Diagnosis not present

## 2022-03-16 DIAGNOSIS — E1122 Type 2 diabetes mellitus with diabetic chronic kidney disease: Secondary | ICD-10-CM | POA: Diagnosis not present

## 2022-03-16 DIAGNOSIS — R29898 Other symptoms and signs involving the musculoskeletal system: Secondary | ICD-10-CM | POA: Diagnosis not present

## 2022-03-16 DIAGNOSIS — I130 Hypertensive heart and chronic kidney disease with heart failure and stage 1 through stage 4 chronic kidney disease, or unspecified chronic kidney disease: Secondary | ICD-10-CM | POA: Insufficient documentation

## 2022-03-16 DIAGNOSIS — Z7901 Long term (current) use of anticoagulants: Secondary | ICD-10-CM | POA: Diagnosis not present

## 2022-03-16 DIAGNOSIS — Z79899 Other long term (current) drug therapy: Secondary | ICD-10-CM | POA: Diagnosis not present

## 2022-03-16 DIAGNOSIS — I2510 Atherosclerotic heart disease of native coronary artery without angina pectoris: Secondary | ICD-10-CM | POA: Insufficient documentation

## 2022-03-16 DIAGNOSIS — I5040 Unspecified combined systolic (congestive) and diastolic (congestive) heart failure: Secondary | ICD-10-CM | POA: Insufficient documentation

## 2022-03-16 DIAGNOSIS — T83021A Displacement of indwelling urethral catheter, initial encounter: Secondary | ICD-10-CM | POA: Diagnosis not present

## 2022-03-16 DIAGNOSIS — J449 Chronic obstructive pulmonary disease, unspecified: Secondary | ICD-10-CM | POA: Insufficient documentation

## 2022-03-16 DIAGNOSIS — Z7401 Bed confinement status: Secondary | ICD-10-CM | POA: Diagnosis not present

## 2022-03-16 DIAGNOSIS — Z950 Presence of cardiac pacemaker: Secondary | ICD-10-CM | POA: Insufficient documentation

## 2022-03-16 DIAGNOSIS — Z91040 Latex allergy status: Secondary | ICD-10-CM | POA: Insufficient documentation

## 2022-03-16 DIAGNOSIS — N183 Chronic kidney disease, stage 3 unspecified: Secondary | ICD-10-CM | POA: Diagnosis not present

## 2022-03-16 DIAGNOSIS — Z743 Need for continuous supervision: Secondary | ICD-10-CM | POA: Diagnosis not present

## 2022-03-16 LAB — BASIC METABOLIC PANEL
BUN/Creatinine Ratio: 11 — ABNORMAL LOW (ref 12–28)
BUN: 20 mg/dL (ref 8–27)
CO2: 20 mmol/L (ref 20–29)
Calcium: 10.1 mg/dL (ref 8.7–10.3)
Chloride: 103 mmol/L (ref 96–106)
Creatinine, Ser: 1.74 mg/dL — ABNORMAL HIGH (ref 0.57–1.00)
Glucose: 123 mg/dL — ABNORMAL HIGH (ref 70–99)
Potassium: 4.4 mmol/L (ref 3.5–5.2)
Sodium: 138 mmol/L (ref 134–144)
eGFR: 29 mL/min/{1.73_m2} — ABNORMAL LOW (ref >59)

## 2022-03-16 LAB — URINALYSIS, ROUTINE W REFLEX MICROSCOPIC
Bilirubin Urine: NEGATIVE
Glucose, UA: 100 mg/dL — AB
Ketones, ur: NEGATIVE mg/dL
Leukocytes,Ua: NEGATIVE
Nitrite: NEGATIVE
Protein, ur: >=300 mg/dL — AB
Specific Gravity, Urine: 1.02 (ref 1.005–1.030)
pH: 6 (ref 5.0–8.0)

## 2022-03-16 LAB — URINALYSIS, MICROSCOPIC (REFLEX)

## 2022-03-16 LAB — MAGNESIUM: Magnesium: 1.9 mg/dL (ref 1.6–2.3)

## 2022-03-16 NOTE — Telephone Encounter (Signed)
Son called and reports that his mothers foley catheter has come out and he is not sure if the "whole thing is there."  Advised that this was not the answer he wanted and was not ideal but she needed to go to the urologist who placed it or the emergency room.  Before I could tell him that we would not be able to replace the catherter and she may need imaging if the cathter did indeed break-off he states "well you're not gonna like this, That's what I wanted to speak with Page" and hung up the phone.  Christen Bame, CMA

## 2022-03-16 NOTE — Discharge Instructions (Addendum)
Thank you for coming to [facility] Emergency Department. You were seen for dislodged foley catheter. We did an exam, labs, and these showed no acute findings. Foley was replaced without incident. Urine was negative for infection or signs of injury to urethra.  [facility] as previously scheduled on 3/15.   Do not hesitate to return to the ED or call 911 if you experience: -Worsening symptoms -Foley bag stops draining -Frank blood in foley bag with clots -Abdominal pain or distension -Lightheadedness, passing out -Fevers/chills -Anything else that concerns you

## 2022-03-16 NOTE — ED Notes (Signed)
Called [facility] for transport home at 5:54pm

## 2022-03-16 NOTE — Progress Notes (Addendum)
This CSW spoke to the son, the son is wanting his mom to have [place] with RN at this time agency's have declined the patient due to INS. At this time CSW has reached out to [place] [place]. CSW called [facility] they will accept the patient however will not have a nurse until 3/13. TOC will let the son know.     Addend:  5:22 pm   This CSW has reached out the patients' son the son stated he drive his mom to the hospital. The son is aware of [facility]/ [facility] having a nurse on 3/13

## 2022-03-16 NOTE — ED Triage Notes (Signed)
Pt reports urinary cathter removed this morning , was discharged from [facility] with foley in place per son . Pt denies pain .

## 2022-03-16 NOTE — Telephone Encounter (Signed)
Agree with ED evaluation. 

## 2022-03-16 NOTE — Progress Notes (Unsigned)
Saw patient yesterday with her son during a RN visit.  She was awake and communicative with obvious tardive dyskinesa mouth movements. Her son felt that she could not stand a full visit or exam She had just come from having an echo  She has follow up appointments made with psychiatry and urology   Her ECG showed paced rhythm with decreased QTc since discharge  Her BMET today shows mildly improved renal function and normal potassium

## 2022-03-16 NOTE — Telephone Encounter (Signed)
Called and spoke with son Georgena Spurling to follow up on earlier clinical call.  He notes frustration with clinic call earlier today and notes "if you had taken time to read my mothers chart you would know that transportation is difficult for [place]." He notes that they are now at the ED being evaluated. I explained that I had indeed reviewed her chart and am glad that she is getting care. He then notes he is appreciative of [facility] care.   Yehuda Savannah MD

## 2022-03-16 NOTE — ED Provider Notes (Signed)
[place] [facility] AT [facility] [place] Provider Note   CSN: [id number] Arrival date & time: 03/16/22  1448     History  No chief complaint on file.   Abigail Wallace is a 77 y.o. female with probable vaginal prolapse leading to urinary retention status post recent placement of Foley catheter, COPD, A-fib/SSS s/p pacemaker, combined systolic/diastolic HF, HTN, HLD, [id number], CKD stage 3, gout, paranoid schizophrenia,  presents with foley issue.  Patient presents with her son who provides additional history.  Patient reports that she had a new home health aide that came today to help take care of her and while he was cleaning the area around her Foley catheter became dislodged and came out.  It was not painful in any way.  Patient does not have any abdominal pain, urethral pain.  Has not been able to urinate since the Foley was dislodged. There has not been any bleeding or discomfort. She states that she is otherwise feeling normal in her [place].  Per chart review patient was recently admitted from 02/25/2022 to 03/05/2022 for acute new onset heart failure, probable vaginal prolapse leading to urinary retention.  This caused an AKI superimposed on CKD stage III.  Was given an indwelling Foley catheter and was [facility] for [facility] on 03/01/2022.  Son at bedside states that he is having difficulty with home health as well as the initiation of palliative care which was recommended as a possible option during their admission.  HPI     Home Medications Prior to Admission medications   Medication Sig Start Date End Date Taking? Authorizing Provider  acetaminophen (TYLENOL) 500 MG tablet Take 1,000 mg by mouth every 6 (six) hours as needed for mild pain or headache.    [provider]  albuterol (VENTOLIN HFA) 108 (90 Base) MCG/ACT inhaler Inhale 2 puffs into the lungs every 6 (six) hours as needed for wheezing or shortness of breath. 07/12/21   Martyn Malay, MD  allopurinol (ZYLOPRIM) 100 MG tablet Take 1 tablet (100 mg total) by mouth daily. 01/17/22   Martyn Malay, MD  amLODipine (NORVASC) 10 MG tablet Take 1 tablet (10 mg total) by mouth daily. 11/13/21   Martyn Malay, MD  apixaban (ELIQUIS) 5 MG TABS tablet Take 1 tablet (5 mg total) by mouth 2 (two) times daily. 07/12/21 07/12/22  Martyn Malay, MD  Blood Glucose Monitoring Suppl [facility] VERIO) w/Device KIT Check blood sugar each AM  E11.9 03/09/22   Lind Covert, MD  cholecalciferol (VITAMIN D3) 25 MCG (1000 UNIT) tablet Take 1 tablet (1,000 Units total) by mouth daily. 05/09/21   Martyn Malay, MD  Diapers & Supplies MISC 1 Product by Does not apply route 6 (six) times daily. 02/15/22 03/17/22  McDiarmid, Blane Ohara, MD  diclofenac Sodium (VOLTAREN) 1 % GEL Apply 2 g topically 4 (four) times daily as needed (pain). 04/25/21   Martyn Malay, MD  divalproex (DEPAKOTE) 250 MG DR tablet Take 1 tablet (250 mg total) by mouth every 12 (twelve) hours. 03/05/22   Nita Sells, MD  furosemide (LASIX) 40 MG tablet Take 1 tablet (40 mg total) by mouth every other day. 03/05/22 03/05/23  Nita Sells, MD  glucose blood (ONETOUCH VERIO) test strip Please use to check blood sugar once daily. E11.9 09/05/21   Martyn Malay, MD  haloperidol (HALDOL) 2 MG tablet Take 1 tablet (2 mg total) by mouth 2 (two) times daily as needed for agitation.  03/05/22   Starkes-Perry, Gayland Curry, FNP  hydrALAZINE (APRESOLINE) 25 MG tablet Take 1 tablet (25 mg total) by mouth every 8 (eight) hours as needed (BP>170/90 MMhg.). 03/05/22   Nita Sells, MD  hydrocortisone cream 0.5 % Apply 1 application topically 2 (two) times daily. Apply WITH Nystatin under left breast Patient not taking: Reported on 02/25/2022 11/28/20   Martyn Malay, MD  Incontinence Supplies MISC 1 Units by Does not apply route as needed. 12/13/15   McKeag, Marylynn Pearson, MD  ipratropium-albuterol (DUONEB) 0.5-2.5 (3) MG/3ML SOLN Take 3 mLs  by nebulization every 6 (six) hours as needed. 09/14/21   Martyn Malay, MD  Lancet Device MISC 1 Device by Does not apply route 3 (three) times a week. 07/15/19   Martyn Malay, MD  linagliptin (TRADJENTA) 5 MG TABS tablet Take 1 tablet (5 mg total) by mouth daily. 02/20/22   Dickie La, MD  metoprolol tartrate (LOPRESSOR) 25 MG tablet Take 1 tablet (25 mg total) by mouth 2 (two) times daily. 03/05/22   Nita Sells, MD  mupirocin cream (BACTROBAN) 2 % Apply 1 application. topically 2 (two) times daily. Patient taking differently: Apply 1 application  topically 2 (two) times daily as needed (to affected areas- for irritation). 03/24/21   Martyn Malay, MD  nystatin (MYCOSTATIN/NYSTOP) powder Apply 1 Application topically 3 (three) times daily. Patient taking differently: Apply 1 Application topically 3 (three) times daily as needed (under the breasts and skin folds- for irritation). 07/03/21   Martyn Malay, MD  OneTouch Delica Lancets 99991111 MISC Use to test once daily.  DX code:E11.9 07/15/20   Martyn Malay, MD  OVER THE COUNTER MEDICATION Apply 1 application  topically See admin instructions. Caldesene Medicated Protecting Powder- Apply under the breasts and between the skin folds 2 times a day as needed for irritation    [provider]  pravastatin (PRAVACHOL) 40 MG tablet TAKE ONE TABLET BY MOUTH EVERY EVENING Patient taking differently: Take 40 mg by mouth every evening. 05/08/21   Croitoru, Mihai, MD  senna (SENOKOT) 8.6 MG TABS tablet Take 1 tablet (8.6 mg total) by mouth daily as needed for mild constipation. 04/11/21   Martyn Malay, MD  sodium bicarbonate 650 MG tablet Take 1 tablet (650 mg total) by mouth daily. 03/05/22   Nita Sells, MD  umeclidinium-vilanterol (ANORO ELLIPTA) 62.5-25 MCG/ACT AEPB Inhale 1 puff into the lungs daily.    [provider]      Allergies    Aripiprazole, Clozapine, Benadryl [diphenhydramine hcl], Benztropine, Latuda  [lurasidone hcl], Lorazepam, Remeron [mirtazapine], Haloperidol lactate, Keflex [cephalexin], Codeine, and Latex    Review of Systems   Review of Systems Review of systems Negative for f/c.  A 10 point review of systems was performed and is negative unless otherwise reported in HPI.  Physical Exam Updated Vital Signs BP (!) 149/77 (BP Location: Left Arm)   Pulse 76   Temp 99.4 F (37.4 C) (Oral)   Resp 18   SpO2 97%  Physical Exam General: Normal appearing elderly female, lying in bed.  HEENT: Sclera anicteric, MMM, trachea midline.  Cardiology: RRR, no murmurs/rubs/gallops. BL radial and DP pulses equal bilaterally.  Resp: Normal respiratory rate and effort. CTAB, no wheezes, rhonchi, crackles.  Abd: Soft, non-tender, non-distended. No rebound tenderness or guarding.  GU: Mild irritation around the urethra, no wounds/ulcerations/indruation/fluctuance. No bleeding noted.  MSK: No peripheral edema or signs of trauma. Extremities without deformity or TTP. No cyanosis or  clubbing. Skin: warm, dry. No rashes or lesions. Back: No CVA tenderness Neuro: A&Ox4, CNs II-XII grossly intact. MAEs. Sensation grossly intact.  Psych: Normal mood and affect.   ED Results / Procedures / Treatments   Labs (all labs ordered are listed, but only abnormal results are displayed) Labs Reviewed  URINALYSIS, ROUTINE W REFLEX MICROSCOPIC - Abnormal; Notable for the following components:      Result Value   Glucose, UA 100 (*)    Hgb urine dipstick TRACE (*)    Protein, ur >=300 (*)    All other components within normal limits  URINALYSIS, MICROSCOPIC (REFLEX) - Abnormal; Notable for the following components:   Bacteria, UA RARE (*)    All other components within normal limits    EKG None  Radiology ECHOCARDIOGRAM COMPLETE  Result Date: 03/15/2022    ECHOCARDIOGRAM REPORT   Patient Name:   Maylene [name] Date of Exam: 03/15/2022 Medical Rec #:  [id number]       Height:       66.0 in Accession #:     [id number]      Weight:       277.3 lb Date of Birth:  Mar 12, 1945       BSA:          2.298 m Patient Age:    59 years        BP:           143/74 mmHg Patient Gender: F               HR:           70 bpm. Exam Location:  Outpatient Procedure: 2D Echo, Color Doppler and Cardiac Doppler Indications:    Congestive Heart Failure I50.9  History:        Patient has prior history of Echocardiogram examinations. CHF,                 CAD, Pacemaker, COPD, Arrythmias:Atrial Fibrillation; Risk                 Factors:Hypertension, Diabetes and Morbid Obesity, CKD.  Sonographer:    L. Thornton-Maynard Referring Phys: Secor  1. Given severe LVH can consider further w/u for amyloid or hypertrophic cardiomyopathy if clinically inidcated.  2. Distal septal and apical hypokinesis . Left ventricular ejection fraction, by estimation, is 45 to 50%. The left ventricle has mildly decreased function. The left ventricle has no regional wall motion abnormalities. There is severe left ventricular hypertrophy. Left ventricular diastolic parameters are indeterminate.  3. Pacing wires in RA/RV . Right ventricular systolic function is normal. The right ventricular size is normal. There is normal pulmonary artery systolic pressure.  4. Chordal SAM with no LVOT gradient . The mitral valve is abnormal. Trivial mitral valve regurgitation. No evidence of mitral stenosis.  5. The aortic valve is tricuspid. There is mild calcification of the aortic valve. There is mild thickening of the aortic valve. Aortic valve regurgitation is not visualized. Aortic valve sclerosis is present, with no evidence of aortic valve stenosis.  6. The inferior vena cava is normal in size with greater than 50% respiratory variability, suggesting right atrial pressure of 3 mmHg. FINDINGS  Left Ventricle: Distal septal and apical hypokinesis. Left ventricular ejection fraction, by estimation, is 45 to 50%. The left ventricle has mildly  decreased function. The left ventricle has no regional wall motion abnormalities. The left ventricular internal cavity size was normal in size. There is severe  left ventricular hypertrophy. Left ventricular diastolic parameters are indeterminate. Right Ventricle: Pacing wires in RA/RV. The right ventricular size is normal. No increase in right ventricular wall thickness. Right ventricular systolic function is normal. There is normal pulmonary artery systolic pressure. The tricuspid regurgitant velocity is 2.62 m/s, and with an assumed right atrial pressure of 3 mmHg, the estimated right ventricular systolic pressure is [id number] mmHg. Left Atrium: Left atrial size was normal in size. Right Atrium: Right atrial size was normal in size. Pericardium: Trivial pericardial effusion is present. Mitral Valve: Chordal SAM with no LVOT gradient. The mitral valve is abnormal. There is mild thickening of the mitral valve leaflet(s). There is mild calcification of the mitral valve leaflet(s). Mild mitral annular calcification. Trivial mitral valve regurgitation. No evidence of mitral valve stenosis. Tricuspid Valve: The tricuspid valve is normal in structure. Tricuspid valve regurgitation is mild . No evidence of tricuspid stenosis. Aortic Valve: The aortic valve is tricuspid. There is mild calcification of the aortic valve. There is mild thickening of the aortic valve. Aortic valve regurgitation is not visualized. Aortic valve sclerosis is present, with no evidence of aortic valve stenosis. Aortic valve mean gradient measures 3.0 mmHg. Aortic valve peak gradient measures 5.2 mmHg. Aortic valve area, by VTI measures 2.29 cm. Pulmonic Valve: The pulmonic valve was normal in structure. Pulmonic valve regurgitation is not visualized. No evidence of pulmonic stenosis. Aorta: The aortic root is normal in size and structure. Venous: The inferior vena cava is normal in size with greater than 50% respiratory variability, suggesting right  atrial pressure of 3 mmHg. IAS/Shunts: No atrial level shunt detected by color flow Doppler. Additional Comments: Given severe LVH can consider further w/u for amyloid or hypertrophic cardiomyopathy if clinically inidcated.  LEFT VENTRICLE PLAX 2D LVIDd:         5.00 cm     Diastology LVIDs:         3.70 cm     LV e' medial:    5.87 cm/s LV PW:         1.40 cm     LV E/e' medial:  13.4 LV IVS:        2.00 cm     LV e' lateral:   4.12 cm/s LVOT diam:     2.30 cm     LV E/e' lateral: 19.1 LV SV:         41 LV SV Index:   18 LVOT Area:     4.15 cm  LV Volumes (MOD) LV vol d, MOD A2C: 82.2 ml LV vol d, MOD A4C: 73.3 ml LV vol s, MOD A2C: 42.1 ml LV vol s, MOD A4C: 42.6 ml LV SV MOD A2C:     40.1 ml LV SV MOD A4C:     73.3 ml LV SV MOD BP:      32.7 ml RIGHT VENTRICLE             IVC RV Basal diam:  3.40 cm     IVC diam: 2.90 cm RV S prime:     10.00 cm/s TAPSE (M-mode): 2.1 cm LEFT ATRIUM           Index       RIGHT ATRIUM           Index LA diam:      3.70 cm 1.61 cm/m  RA Area:     13.70 cm LA Vol (A4C): 19.4 ml 8.44 ml/m  RA Volume:   27.40 ml  11.92  ml/m  AORTIC VALVE                    PULMONIC VALVE AV Area (Vmax):    2.88 cm     PV Vmax:       1.00 m/s AV Area (Vmean):   2.29 cm     PV Peak grad:  4.0 mmHg AV Area (VTI):     2.29 cm AV Vmax:           114.50 cm/s AV Vmean:          79.250 cm/s AV VTI:            0.181 m AV Peak Grad:      5.2 mmHg AV Mean Grad:      3.0 mmHg LVOT Vmax:         79.30 cm/s LVOT Vmean:        43.600 cm/s LVOT VTI:          0.100 m LVOT/AV VTI ratio: 0.55  AORTA Ao Root diam: 3.00 cm Ao Asc diam:  3.20 cm MITRAL VALVE               TRICUSPID VALVE MV Area (PHT): 3.77 cm    TR Peak grad:   27.5 mmHg MV Decel Time: 201 msec    TR Vmax:        262.00 cm/s MV E velocity: 78.50 cm/s MV A velocity: 39.30 cm/s  SHUNTS MV E/A ratio:  2.00        Systemic VTI:  0.10 m                            Systemic Diam: 2.30 cm Jenkins Rouge MD Electronically signed by Jenkins Rouge MD Signature  Date/Time: 03/15/2022/3:11:53 PM    Final     Procedures Procedures    Medications Ordered in ED Medications - No data to display  ED Course/ Medical Decision Making/ A&P                          Medical Decision Making Amount and/or Complexity of Data Reviewed Labs: ordered. Decision-making details documented in ED Course.    This patient presents to the ED for concern of removed foley, this involves an extensive number of treatment options, and is a complaint that carries with it a high risk of complications and morbidity.  However patient is very well-appearing is in her normal state of health, afebrile and hemodynamically stable.  MDM:    Patient presents with accidentally and traumatically remove Foley catheter this morning.  Reassuringly she has no pain.  On exam there is mild irritation around the urethra however there is no indication of infection and patient has no significant pain.  Still must consider injury to the urethra or bladder, will obtain a UA to assess for hematuria the patient has not reported any.  Will also replace the Foley and ensure that it drains well.  She has not endorsed any dysuria or urinary symptoms, no fevers or chills to suggest UTI.  Otherwise is in normal state of health and has no concerns.  Clinical Course as of 03/27/22 1114  Fri Mar 16, 2022  1736 Urinalysis, Routine w reflex microscopic -Urine, Catheterized(!) Neg fo rinfection or blood. No indication of injury to urethra/bladder [HN]    Clinical Course User Index [HN] Audley Hose, MD    Labs: I  Ordered, and personally interpreted labs.  The pertinent results include: UA  Additional history obtained from chart review, son at bedside.    Reevaluation: After the interventions noted above, I reevaluated the patient and found that they have :resolved  Social Determinants of Health:  patient lives at home with her son  Disposition: UA did not demonstrate any hematuria, patient still  has no pain and the new Foley is placed and is draining appropriately, no hematuria in the bag.  Patient feels very well.  Her Foley has been replaced without difficulty and she will be discharged home back with other plans for urology follow-up as originally scheduled.  I discussed with patient's son the initiation of palliative care and possible home health and I asked him if he would like to talk with case management.  I did consult with case management but he states he already is talking with other social worker people already via the phone at home and does not think he needs any additional resources given here.  I still will print the social services resource list in case it may help him.  All questions answered to patient and her son satisfaction.  Will be discharged home with Foley in place and urology follow-up.  Co morbidities that complicate the patient evaluation  Past Medical History:  Diagnosis Date   Abdominal wall hernia 01/2017   Advanced care planning/counseling discussion 09/29/2021   Atrial fibrillation (HCC)    CHF (congestive heart failure) (HCC)    CKD (chronic kidney disease)    COPD (chronic obstructive pulmonary disease) (HCC)    Coronary artery disease    NON-CRITICAL   Ectasis aorta (HCC)    GERD (gastroesophageal reflux disease)    Gout    Hypertension    Memory difficulties 12/02/2013   Pacemaker    Psychosis ([place])    Tardive dyskinesia 12/02/2013   Type 2 diabetes mellitus ([place])      Medicines No orders of the defined types were placed in this encounter.   I have reviewed the patients home medicines and have made adjustments as needed  Problem List / ED Course: Problem List Items Addressed This Visit   None               This note was created using dictation software, which may contain spelling or grammatical errors.    Audley Hose, MD 03/27/22 1120

## 2022-03-16 NOTE — Progress Notes (Signed)
Patient presents in nurse clinic for EKG and labs.  EKG performed without complication and discussed with Dr. Erin Hearing.  All questions answered.  Patient was taken to the lab.

## 2022-03-18 ENCOUNTER — Telehealth: Payer: Self-pay | Admitting: Student

## 2022-03-18 NOTE — Telephone Encounter (Signed)
   Son called Answering Service with concerns about fluctuating heart rate. Called and spoke with son Georgena Spurling (okay per DPR). Patient was recently admitted in 02/2022 for acute on chronic CHF. Hospitalization was complicated by acute urinary retention causing AKI superimposed on CKD and encephalopathy. Patient also has underlying schizophrenia with history of delusion and hallucinations. Son takes excellent care of her. She was started on Haldol during recent admission and patient states her shaking as been worse with this and he does not like the Haldol. He states yesterday patient said she felt "funny" and asked for her home O2. She was not complaining on chest pain. Son states she always looks short of breath even when she is wearing O2. Son placed home O2 on and checked her vitals. Her heart rates was elevated at 105 bpm and it usually is always below 80 bpm. BP was also elevated in the 170/80s. He gave a dose of PRN Hydralazine PRN. BP improved but heart rates have still stayed elevated. Most recent BP 150/72 and HR 99 this morning around 4:30am.  He is concerned about her elevated heart rate and thought the pacemaker (which she has for CHB) was supposed to help with this. I explained that her pacemaker helps with slow heart rates. She also has a history of atrial fibrillation so I wonder if she is back in this. He has already sent in a remote interrogation. I am unable to view this but will send a message to the device clinic to look at this tomorrow. In the mean time, I recommended patient increase her Lopressor to '50mg'$  twice daily. She has previously had some lower BP readings but not recently. Son states systolic BP does not usually drop below 140. Patient's Benicar was recently stopped as well due to AKI. Therefore, I think she should tolerate the higher dose of Lopressor fine. I also advised son to reach out to PCP about his concerns about the patient's shaking and his concerns over the Haldol. He  was in agreement with this. I also arranged a follow-up visit with Dr. Sallyanne Kuster on 03/26/2022 at 11:20am. However, advised son that she needs to be seen in the ED if her shaking worsens, she has mental status changes, or it looks like her breathing is worsening. Son voiced understanding and was very appreciative of the call.   Of note, son has difficulty transporting the patient as she his wheelchair dependent; however, he has been using transportation services. He expressed interest in having our Social Worker speak with him so I have sent a staff message to our social worker Westley Hummer asking her to reach out to him as well.  Will route this note to Dr. Sallyanne Kuster so that he is aware.  Darreld Mclean, PA-C 03/18/2022 9:03 AM

## 2022-03-19 ENCOUNTER — Other Ambulatory Visit: Payer: 59

## 2022-03-19 ENCOUNTER — Telehealth: Payer: Self-pay

## 2022-03-19 ENCOUNTER — Telehealth: Payer: Self-pay | Admitting: Licensed Clinical Social Worker

## 2022-03-19 VITALS — BP 130/80 | HR 72 | Temp 98.4°F

## 2022-03-19 DIAGNOSIS — Z515 Encounter for palliative care: Secondary | ICD-10-CM

## 2022-03-19 NOTE — Telephone Encounter (Signed)
Abigail Wallace calls nurse line requesting refills on Anoro, Neb solution and Metoprolol.   He reports he was told by cards to increase Metoprolol from '25mg'$  BID to '50mg'$  BID. See recent cards note. He reports Abigail Wallace "usually call this medication in for her." He is requesting an updated prescription.   Will forward to PCP.

## 2022-03-19 NOTE — Progress Notes (Signed)
PATIENT NAME: Abigail Wallace DOB: 06-29-1945 MRN: [id number]  PRIMARY CARE PROVIDER: Martyn Malay, MD  RESPONSIBLE PARTY:  Acct ID - Guarantor Home Phone Work Phone Relationship Acct Type  [id number] - [name],REB* [phone]  Self P/F     [address] [place], [place], [place] [address]   Palliative Care Initial Encounter Note   Completed home visit with Abigail Wallace, SW. Abigail Wallace ,CG and son Abigail Wallace also present.     HISTORY OF PRESENT ILLNESS:    Respiratory: not currently on O2 at this time; uses O2 @ 2.5L only when she needs it or is SOB  Cardiac: CHF; son measures ankles daily; no edema noted at this time  Cognitive: alert and oriented; conversationally appropriate;   Appetite: appetite coming back; now eats about 2k calories daily  GI/GU: Foley draining clear yellow urine; BM q2-3 days; if none, son gives Senna   Mobility: sits in w/c; unable to sit up for entire visit  ADLs: [name] will be coming with PT,OT and RN; LPN to call them to f/u w/ST   Sleeping Pattern: sleeps about 3-4 hrs nightly  Pain: no pain at this time; son reports hip pain; L arm and spine pain that is reduced by taking Tylenol  Palliative Care/ Hospice: RN explained role and purpose of palliative care including visit frequency. Spoke briefly about Hospice, the philosophy and the ongoing dialogue we will have regarding his Mom's health status  Goals of Care: To stay in the home with son as long as possible  HISTORY OF PRESENT ILLNESS:    CODE STATUS: Full Code ADVANCED DIRECTIVES: N MOST FORM: N PPS: 30%   PHYSICAL EXAM:   03/19/2022 12:48 PM  BP 130/80  BP Location Right Arm  Patient Position Sitting  Cuff Size Large  Pulse 72  Temp 98.4 F (36.9 C)  SpO2 96 %      LUNGS: clear to auscultation  CARDIAC: Cor RRR EXTREMITIES: normal SKIN: Skin color, texture, turgor normal. No rashes or lesions  NEURO: negative     Abigail Wallace Abigail Housekeeper, LPN

## 2022-03-19 NOTE — Telephone Encounter (Signed)
-----   Message from Darreld Mclean, [place] sent at 03/18/2022  9:05 AM EST ----- Regarding: Remote Device Check Hi Device Team,  Patient's son called today with concerns that heart rate has been elevated. She has a history of CHB s/p PPM and paroxysmal atrial fibrillation. I wonder if she is back in atrial fibrillation. I increased her Lopressor. Son already sent in a remote check. Can you help look at this tomorrow?  Thank you so much! Callie

## 2022-03-19 NOTE — Progress Notes (Signed)
COMMUNITY PALLIATIVE CARE SW NOTE  PATIENT NAME: Abigail Wallace DOB: February 19, 1945 MRN: [id number]  PRIMARY CARE PROVIDER: Martyn Malay, MD  RESPONSIBLE PARTY:  Acct ID - Guarantor Home Phone Work Phone Relationship Acct Type  [id number] - [name],REB* [phone]  Self P/F     [address], [place], [place] [address]   Initial Palliative Care Visit/Clinical Social Work  Initial SW and nurse-D. Curry visit with patient at her home. She was present with her son-[name] and PCS caregiver-Ann.   Patient is alert and oriented x3. Patient has a Foley catheter. Patient has a BM every 2/3 days. She is wheelchair bound at this time. Patient has an extensive mental health history. She has a great deal of anxiety. She is sleeping at least 3-4 hours a night. Patient will start with [facility] for PT, OT and RN. The son advised that he would like to continue with [facility] services. Patient has an o2 concentrator at 2.5 L via nasal cannula that she uses a needed. Patient has involuntary shaking in her hands/arms and some drooling. She also sits with her tongue out. Patient tolerated her nursing assessment well. Patient has a lot of edema, he measures patient's ankles daily. Her appetite is slowly improving. She is having some swallowing issues. Patient has pain to her left hip and polio when she was young. She also pain from her spine that she manages with Tylenol   Patient's son was provided a list of in-home medical care providers. Nurse will request a speech evaluation for patient to address swallowing issues. Patient has support/visits through [facility]. Patient has and aide through [facility] services ([facility]) 5 days a week for 3.5 hours a day. Patient's son transport patient to and from appointments.  SOCIAL HX: Patient was born and raised in [place], but lived in several other places. She has two children. Social History   Tobacco Use   Smoking status: Every Day    Types: Cigarettes    Last  attempt to quit: 03/15/2021    Years since quitting: 1.0   Smokeless tobacco: Never   Tobacco comments:    1cig a day  Substance Use Topics   Alcohol use: No    Alcohol/week: 0.0 standard drinks of alcohol    CODE STATUS: Full Code ADVANCED DIRECTIVES: No MOST FORM COMPLETE:  No HOSPICE EDUCATION PROVIDED: Yes  Duration of visit and documentation: 60 minutes  Dhruti Ghuman, LCSW

## 2022-03-19 NOTE — Telephone Encounter (Signed)
H&V Care Navigation CSW Progress Note  Clinical Social Worker contacted caregiver by phone to f/u on community resources. No answer on pt son [name]'s phone today at [phone]. Left voicemail requesting return call if we could assist with any additional community resources.   Patient is participating in a Managed Medicaid Plan:  No UHC Medicare and Medicaid [place]: No Food Insecurity (03/06/2022)  Housing: Low Risk  (03/06/2022)  Transportation Needs: No Transportation Needs (02/25/2022)  Utilities: Not At Risk (02/25/2022)  Alcohol Screen: Low Risk  (12/27/2020)  Depression (PHQ2-9): High Risk (02/22/2022)  Financial Resource Strain: Low Risk  (12/27/2020)  Physical Activity: Inactive (12/27/2020)  Social Connections: Socially Isolated (12/27/2020)  Stress: No Stress Concern Present (12/27/2020)  Tobacco Use: High Risk (03/16/2022)   Westley Hummer, MSW, LCSW Clinical Social Worker [place]  [phone]- work cell phone (preferred) [phone]- desk phone

## 2022-03-19 NOTE — Telephone Encounter (Signed)
Reviewed patient's transmission that came in on March 3rd at 6:15am. Her presenting does confirm at that time that she was back in Afib.  Appears according to her burden she has been consistently in AF since the end of December.  There are some noted high ventricular rates.  Forwarding back to [employer].  Will cc: Dr. Sallyanne Kuster    PRESENTING:

## 2022-03-19 NOTE — Telephone Encounter (Signed)
Considering all her other health issues, I do not think we will plan on another cardioversion or on starting antiarrhythmic medications.  Will treat this as longstanding persistent/probably permanent atrial fibrillation. On anticoagulation.  Rapid ventricular rates have not been a big issue, no change in medications.

## 2022-03-20 ENCOUNTER — Telehealth: Payer: Self-pay | Admitting: Cardiovascular Disease

## 2022-03-20 MED ORDER — ANORO ELLIPTA 62.5-25 MCG/ACT IN AEPB: 1.0000 | INHALATION_SPRAY | Freq: Every day | RESPIRATORY_TRACT | 1 refills | Status: DC

## 2022-03-20 MED ORDER — METOPROLOL TARTRATE 25 MG PO TABS: 50.0000 mg | ORAL_TABLET | Freq: Two times a day (BID) | ORAL | 1 refills | Status: DC

## 2022-03-20 MED ORDER — IPRATROPIUM-ALBUTEROL 0.5-2.5 (3) MG/3ML IN SOLN: 3.0000 mL | Freq: Four times a day (QID) | RESPIRATORY_TRACT | 3 refills | Status: DC | PRN

## 2022-03-20 NOTE — Telephone Encounter (Signed)
-----   Message from Diamond Nickel, RN sent at 03/19/2022 11:48 AM EST ----- Regarding: FW: Remote Device Check Hey can one of you guys just reach out and see if you can get her to send me a remote today?    ----- Message ----- From: Darreld Mclean, PA-C Sent: 03/18/2022   9:09 AM EST To: Cv [facility] Heartcare Device Subject: Remote Device Check                            Hi Device Team,  Patient's son called today with concerns that heart rate has been elevated. She has a history of CHB s/p PPM and paroxysmal atrial fibrillation. I wonder if she is back in atrial fibrillation. I increased her Lopressor. Son already sent in a remote check. Can you help look at this tomorrow?  Thank you so much! Abigail Wallace

## 2022-03-20 NOTE — Telephone Encounter (Signed)
Abigail Wallace, can you please call patient's son Abigail Wallace and let him know that the device interrogation from this weekend did show that she is back in atrial fibrillation. It looks like she has been back in this since the end of December. Reviewed with Dr. Sallyanne Kuster and he recommends continuing with current medications. As long as her BP is tolerating it, she can continue to take the higher dose of Lopressor ('50mg'$  twice daily). She should keep follow-up appointment with Dr. Sallyanne Kuster on 03/26/2022.   Thank you!

## 2022-03-20 NOTE — Telephone Encounter (Signed)
Spoke with pt's son, [name] (ok per [facility]) regarding recent interrogation of her pacemaker done on 3/3. Discussed notes made by both Dr. Sallyanne Kuster and Sande Rives, PA. Son was mostly concerned with fluctuating heart rates. Advised son on continuing Lopressor '50mg'$  BID, he states they will continue that dose. Pt's blood pressure seems to be tolerating a-fib at this time. Son gives range of blood pressures 130-165/48-80. Son said that they were also give hydralazine '25mg'$  to use as needed when systolic is above [id number], he says he has only had to give this to her twice. Son will plan to keep office visit with Dr. Sallyanne Kuster scheduled for 3/11 @ 11:20am. Son verbalizes understanding.

## 2022-03-20 NOTE — Telephone Encounter (Signed)
Pt's son called wanting to let the nurse's know that they had sent a manual transmission of the device as ask.

## 2022-03-20 NOTE — Telephone Encounter (Signed)
She does not need another transmission.  I will be seeing her in office early next week

## 2022-03-20 NOTE — Telephone Encounter (Signed)
Son called to follow-up on patient's HR readings going up and down.

## 2022-03-20 NOTE — Telephone Encounter (Signed)
I left a message on the patient voicemail for her to send a manual transmission. I also left the device clinic number for her to call if she needs help.

## 2022-03-20 NOTE — Telephone Encounter (Signed)
Covering for Dr. [name] Rx sent in Leeanne Rio, MD

## 2022-03-21 NOTE — Telephone Encounter (Signed)
Transmission received.

## 2022-03-22 ENCOUNTER — Encounter: Payer: Self-pay | Admitting: Family Medicine

## 2022-03-22 ENCOUNTER — Encounter (HOSPITAL_BASED_OUTPATIENT_CLINIC_OR_DEPARTMENT_OTHER): Payer: Self-pay

## 2022-03-22 ENCOUNTER — Telehealth: Payer: Self-pay

## 2022-03-22 ENCOUNTER — Telehealth (HOSPITAL_BASED_OUTPATIENT_CLINIC_OR_DEPARTMENT_OTHER): Payer: Self-pay | Admitting: Licensed Clinical Social Worker

## 2022-03-22 DIAGNOSIS — E1165 Type 2 diabetes mellitus with hyperglycemia: Secondary | ICD-10-CM

## 2022-03-22 DIAGNOSIS — I5042 Chronic combined systolic (congestive) and diastolic (congestive) heart failure: Secondary | ICD-10-CM

## 2022-03-22 DIAGNOSIS — I480 Paroxysmal atrial fibrillation: Secondary | ICD-10-CM

## 2022-03-22 MED ORDER — ONETOUCH VERIO W/DEVICE KIT: PACK | 0 refills | Status: DC

## 2022-03-22 NOTE — Telephone Encounter (Signed)
Abigail Wallace calls nurse line in regards to glucometer.   This prescription was sent to [facility] on 2/23. Abigail Wallace reports this needs to go to [facility] due to [employer] [facility] not accepting Part D  Glucometer resent to [facility]. Marland Kitchen

## 2022-03-22 NOTE — Telephone Encounter (Signed)
H&V Care Navigation CSW Progress Note  Clinical Social Worker contacted caregiver by phone to f/u on community resources. No answer again on pt son [name]'s phone today at [phone]. Left 2nd voicemail requesting return call if we could assist with any additional community resources. Per chart review pt was visited by [employer] care team and their LCSW also provided some resources. I will attempt one more time to reach pt.    Patient is participating in a Managed Medicaid Plan:  No UHC Medicare and Medicaid [place]: No Food Insecurity (03/06/2022)  Housing: Low Risk  (03/06/2022)  Transportation Needs: No Transportation Needs (02/25/2022)  Utilities: Not At Risk (02/25/2022)  Alcohol Screen: Low Risk  (12/27/2020)  Depression (PHQ2-9): High Risk (02/22/2022)  Financial Resource Strain: Low Risk  (12/27/2020)  Physical Activity: Inactive (12/27/2020)  Social Connections: Socially Isolated (12/27/2020)  Stress: No Stress Concern Present (12/27/2020)  Tobacco Use: High Risk (03/19/2022)   Westley Hummer, MSW, LCSW Clinical Social Worker [place]  [phone]- work cell phone (preferred) [phone]- desk phone

## 2022-03-23 ENCOUNTER — Encounter: Payer: Self-pay | Admitting: Family Medicine

## 2022-03-23 ENCOUNTER — Telehealth: Payer: Self-pay | Admitting: Licensed Clinical Social Worker

## 2022-03-23 MED ORDER — ONETOUCH VERIO W/DEVICE KIT: PACK | 0 refills | Status: DC

## 2022-03-23 NOTE — Progress Notes (Signed)
Heart and Vascular Care Navigation  03/23/2022  [place] 10/27/1945 [id number]  Reason for Referral: challenges with home care/transportation Patient is participating in a Managed Medicaid Plan: No UHC Dual Complete  Engaged with pt caregiver (son Jori Moll) for initial visit for Heart and Vascular Care Coordination.                                                                                                   Assessment:                                     LCSW was able to reach pt son [name] today at [phone], left voicemail which was returned same day. Introduced self, role, reason for call. Pt son confirmed home address, PCP team, and that he and his brother are primary contacts- he lives with pt and his brother is local but has his own health challenges. Pt currently receives PCS through [facility] 5 days a week for 3.5 hours- pt son uses this time to run errands, do some self care, and make care calls for pt. Pt uses Medicaid transportation (wheelchair [place]) to get to and from appointments- this involves pt son waiting for extended periods on hold, and he has had challenges with rides being cancelled. He used to take pt in his car but with limited mobility etc it is more difficult for him to do so. We discussed that if pt ever has challenges with this going to cardiology appts to please let us know- pts need to use their benefits initially for rides   Pt has been enrolled in palliative care with [facility] and they have completed home visit with pt and pt son. They provided additional resources, will continue to visit and pt son appreciative to have them involved.  He shares frustration with previous hospital admission and felt they "shut down when we decided to take her home." The services that were arranged from hospital still have not started and the current challenge he is having is that [employer]) [facility] is unable to staff the nursing care needed for his mom's foley  catheter. He has been working with a Psychologist, clinical Angie, but still has not been able to initiate care. Without RN/nursing staffing then pt is unable to receive PT/OT/SLP due to liability. He is calling around to other agencies with the hope that he can get these needed services initiated but is running into obstacles [employer], staffing, will not accept case etc). He shares understanding of current health care challenges, but is unsure how it will effect pt to continue to not have care. He has been providing full time care, intermittently having to leave jobs to do so. He didn't state any specific costs that are most pressing, but I encouraged him to let us know how we can help. He does ask for Home Health list that I have, have sent this through [place].   Encouraged him to reach out if he thinks of any additional ways that we can  assist- he has arranged a ride for appt Monday.   HRT/VAS Care Coordination     Patients Home Cardiology Office [place] Team Social Worker   Social Worker Name: Westley Hummer, [place], [place]   Living arrangements for the past 2 months Single Family Home   Lives with: Adult Children   Patient Current Insurance Coverage Managed Medicare; Medicaid   Patient Has Concern With Paying Medical Bills No   Does Patient Have Prescription Coverage? Yes   Home Assistive Devices/Equipment Other (Comment); [place]; Hospital bed  gait belt   DME Agency [employer]   [place]   Current home services Other (comment)  PCS through [facility] [place] History:                                                                             [place]: No Food Insecurity (03/23/2022)  Housing: Low Risk  (03/23/2022)  Transportation Needs: No Transportation Needs (03/23/2022)  Utilities: Not At Risk (03/23/2022)  Alcohol Screen: Low Risk  (12/27/2020)  Depression (PHQ2-9): High Risk (02/22/2022)   Financial Resource Strain: Low Risk  (03/23/2022)  Physical Activity: Inactive (12/27/2020)  Social Connections: Socially Isolated (12/27/2020)  Stress: No Stress Concern Present (12/27/2020)  Tobacco Use: High Risk (03/19/2022)    SDOH Interventions: Financial Resources:  Financial Strain Interventions: Intervention Not Indicated  Food Insecurity:  Food Insecurity Interventions: Intervention Not Indicated  Housing Insecurity:  Housing Interventions: Intervention Not Indicated  Transportation:   Transportation Interventions: Patient Resources (Friends/Family), Payor Benefit, Other (Comment) (mailed additional resources and dicussed what to do if Medicaid rides fall through)    Follow-up plan:   LCSW has mailed my card and alternate transportation resources. I also have sent myChart message to pt son with additional [facility] agencies. I remain available, if I do not hear from son I will f/u in a few weeks to provide check in.

## 2022-03-26 ENCOUNTER — Ambulatory Visit: Payer: 59 | Attending: Cardiovascular Disease | Admitting: Cardiovascular Disease

## 2022-03-26 ENCOUNTER — Encounter: Payer: Self-pay | Admitting: Cardiovascular Disease

## 2022-03-26 ENCOUNTER — Telehealth: Payer: Self-pay | Admitting: Family Medicine

## 2022-03-26 VITALS — BP 130/72 | HR 69 | Ht 66.0 in | Wt 268.0 lb

## 2022-03-26 DIAGNOSIS — I495 Sick sinus syndrome: Secondary | ICD-10-CM

## 2022-03-26 DIAGNOSIS — D6869 Other thrombophilia: Secondary | ICD-10-CM

## 2022-03-26 DIAGNOSIS — N179 Acute kidney failure, unspecified: Secondary | ICD-10-CM

## 2022-03-26 DIAGNOSIS — I480 Paroxysmal atrial fibrillation: Secondary | ICD-10-CM

## 2022-03-26 DIAGNOSIS — I5042 Chronic combined systolic (congestive) and diastolic (congestive) heart failure: Secondary | ICD-10-CM | POA: Diagnosis not present

## 2022-03-26 DIAGNOSIS — F200 Paranoid schizophrenia: Secondary | ICD-10-CM

## 2022-03-26 DIAGNOSIS — E782 Mixed hyperlipidemia: Secondary | ICD-10-CM | POA: Diagnosis not present

## 2022-03-26 DIAGNOSIS — G4733 Obstructive sleep apnea (adult) (pediatric): Secondary | ICD-10-CM | POA: Diagnosis not present

## 2022-03-26 DIAGNOSIS — Z978 Presence of other specified devices: Secondary | ICD-10-CM

## 2022-03-26 DIAGNOSIS — I442 Atrioventricular block, complete: Secondary | ICD-10-CM | POA: Diagnosis not present

## 2022-03-26 DIAGNOSIS — E119 Type 2 diabetes mellitus without complications: Secondary | ICD-10-CM

## 2022-03-26 DIAGNOSIS — Z950 Presence of cardiac pacemaker: Secondary | ICD-10-CM

## 2022-03-26 DIAGNOSIS — I7143 Infrarenal abdominal aortic aneurysm, without rupture: Secondary | ICD-10-CM | POA: Diagnosis not present

## 2022-03-26 DIAGNOSIS — I10 Essential (primary) hypertension: Secondary | ICD-10-CM | POA: Diagnosis not present

## 2022-03-26 NOTE — Progress Notes (Signed)
Patient ID: Abigail Wallace, female   DOB: 20-May-1945, 77 y.o.   MRN: [id number]    Cardiology Office Note    Date:  03/29/2022   ID:  [address] [place], [place] [birth year], MRN [id number]  PCP:  Martyn Malay, MD  Cardiologist:   Sanda Klein, MD   Chief Complaint  Patient presents with   Pacemaker Check   Congestive Heart Failure          History of Present Illness:  Abigail Wallace is a 77 y.o. female with complete heart block and sinus node dysfunction who presents for atrial fibrillation and a pacemaker check (initial pacemaker and current leads implanted in 2007, most recent generator change [employer] Adapta 2016). As always, her son Abigail Wallace accompanies her today.  Other significant medical problems include obstructive sleep apnea, morbid obesity, AAA, mixed hyperlipidemia and history of schizophrenia.  As always she is accompanied by her son, Abigail Wallace.  Unfortunately she stopped taking her antipsychotics and had a major episode of decompensation.  She had to be started on Haldol.  This has worsened her dyskinesia.  She is working on gradually getting back on her original antipsychotic medications, Depakote and Zyprexa.  She was drinking 10-12 bottles of water a day.  She became severely volume overloaded.  She had bladder urinary retention.  She has become even less physically functional.  She is no longer strong enough to even transfer from wheelchair to bed.  She was unable to stand on the scale today.  Her son has been trying to estimates the degree of edema based on her calf circumference.  She has an indwelling urinary catheter.  She has an appointment with urology on April 4.  She has not had any falls or bleeding complications and continues to take Eliquis.  The echo checked 03/15/2022 during her hospitalization showed severe LVH and mildly depressed left ventricular systolic function with EF 45-50% with apical hypokinesis (I suspect this is pacing related dyskinesis)..  There is  chordal SAM but no LV outflow tract gradient.  There were no serious valvular abnormalities.  She is pacemaker dependent.  She occasionally has idioventricular escape at 30 bpm but this is not always present.  She has about 70% atrial pacing and 100% ventricular pacing.  Device was not interrogated today.  She is morbidly obese with a BMI of 43 (possibly slightly exaggerated due to volume overload).  Metabolic control is not bad with a hemoglobin A1c of 5.4%.  She has a small infrarenal abdominal aortic aneurysm that was measured with a diameter of 3.4 cm on ultrasound.  This area of the aorta measured 2.9 cm on CT of the abdomen.  She does not have known CAD or PAD, other than the infrarenal AAA.   She initially received a dual-chamber permanent pacemaker in 2007 for symptomatic AV block. She has minimal coronary atherosclerosis by cardiac catheterization performed in 2007 and no evidence of insufficiency by nuclear perfusion testing in 2012. By echocardiography she has normal left ventricular size and systolic function and no major structural cardiac abnormalities.    Past Medical History:  Diagnosis Date   Abdominal wall hernia 01/2017   Advanced care planning/counseling discussion 09/29/2021   Atrial fibrillation (HCC)    CHF (congestive heart failure) (HCC)    CKD (chronic kidney disease)    COPD (chronic obstructive pulmonary disease) (HCC)    Coronary artery disease    NON-CRITICAL   Ectasis aorta (HCC)    GERD (gastroesophageal reflux disease)  Gout    Hypertension    Memory difficulties 12/02/2013   Pacemaker    Psychosis ([place])    Tardive dyskinesia 12/02/2013   Type 2 diabetes mellitus [facility])     Past Surgical History:  Procedure Laterality Date   CARDIAC CATHETERIZATION  2007   Non-critical.    CARDIOVERSION N/A 01/27/2021   Procedure: CARDIOVERSION;  Surgeon: Geralynn Rile, MD;  Location: [facility];  Service: Cardiovascular;  Laterality: N/A;    CHOLECYSTECTOMY     ELBOW SURGERY     EP IMPLANTABLE DEVICE N/A 08/31/2014   Procedure: PPM Generator Changeout;  Surgeon: Sanda Klein, MD;  Location: [facility];  Service: Cardiovascular;  Laterality: N/A;   INSERT / REPLACE / REMOVE PACEMAKER  2007   [place]/SYMPTOMATIC HEART BLOCK   PACEMAKER PLACEMENT  09/28/2005   2/2 SSS?   Persantine stress test  05/01/2010   EF 66%. Normal LV sys fx. Unchanged from previous studies.    TRANSTHORACIC ECHOCARDIOGRAM  09/08/10   SEVERE CONCENTRIC HYPERTROPHY.LV FUNCTION WAS VIGOROUS.EF 65%-70%.VENTRICULAR SEPTUM-INCOORDINATE MOTION.LEFT ATRIUM-MILDLY DILATED.TRIVIAL TR.   TUBAL LIGATION      Outpatient Medications Prior to Visit  Medication Sig Dispense Refill   acetaminophen (TYLENOL) 500 MG tablet Take 1,000 mg by mouth every 6 (six) hours as needed for mild pain or headache.     albuterol (VENTOLIN HFA) 108 (90 Base) MCG/ACT inhaler Inhale 2 puffs into the lungs every 6 (six) hours as needed for wheezing or shortness of breath. 18 g 3   allopurinol (ZYLOPRIM) 100 MG tablet Take 1 tablet (100 mg total) by mouth daily. 90 tablet 3   amLODipine (NORVASC) 10 MG tablet Take 1 tablet (10 mg total) by mouth daily. 90 tablet 3   apixaban (ELIQUIS) 5 MG TABS tablet Take 1 tablet (5 mg total) by mouth 2 (two) times daily. 180 tablet 3   Blood Glucose Monitoring Suppl (ONETOUCH VERIO) w/Device KIT Check blood sugar daily. E11.9 1 kit 0   cholecalciferol (VITAMIN D3) 25 MCG (1000 UNIT) tablet Take 1 tablet (1,000 Units total) by mouth daily. 90 tablet 3   diclofenac Sodium (VOLTAREN) 1 % GEL Apply 2 g topically 4 (four) times daily as needed (pain). 350 g 2   divalproex (DEPAKOTE) 250 MG DR tablet Take 1 tablet (250 mg total) by mouth every 12 (twelve) hours. 60 tablet 1   furosemide (LASIX) 40 MG tablet Take 1 tablet (40 mg total) by mouth every other day. 180 tablet 0   glucose blood (ONETOUCH VERIO) test strip Please use to check blood sugar  once daily. E11.9 100 each 12   haloperidol (HALDOL) 2 MG tablet Take 1 tablet (2 mg total) by mouth 2 (two) times daily as needed for agitation. 7 tablet 0   hydrALAZINE (APRESOLINE) 25 MG tablet Take 1 tablet (25 mg total) by mouth every 8 (eight) hours as needed (BP>170/90 MMhg.). 90 tablet 1   hydrocortisone cream 0.5 % Apply 1 application topically 2 (two) times daily. Apply WITH Nystatin under left breast 30 g 0   Incontinence Supplies MISC 1 Units by Does not apply route as needed. 100 each prn   ipratropium-albuterol (DUONEB) 0.5-2.5 (3) MG/3ML SOLN Take 3 mLs by nebulization every 6 (six) hours as needed. 360 mL 3   Lancet Device MISC 1 Device by Does not apply route 3 (three) times a week. 1 each 11   linagliptin (TRADJENTA) 5 MG TABS tablet Take 1 tablet (5 mg total) by mouth daily.  90 tablet 0   mupirocin cream (BACTROBAN) 2 % Apply 1 application. topically 2 (two) times daily. (Patient taking differently: Apply 1 application  topically 2 (two) times daily as needed (to affected areas- for irritation).) 15 g 3   nystatin (MYCOSTATIN/NYSTOP) powder Apply 1 Application topically 3 (three) times daily. (Patient taking differently: Apply 1 Application topically 3 (three) times daily as needed (under the breasts and skin folds- for irritation).) 60 g 1   OneTouch Delica Lancets 99991111 MISC Use to test once daily.  DX code:E11.9 100 each 3   OVER THE COUNTER MEDICATION Apply 1 application  topically See admin instructions. Caldesene Medicated Protecting Powder- Apply under the breasts and between the skin folds 2 times a day as needed for irritation     pravastatin (PRAVACHOL) 40 MG tablet TAKE ONE TABLET BY MOUTH EVERY EVENING (Patient taking differently: Take 40 mg by mouth every evening.) 90 tablet 3   senna (SENOKOT) 8.6 MG TABS tablet Take 1 tablet (8.6 mg total) by mouth daily as needed for mild constipation. 120 tablet 0   sodium bicarbonate 650 MG tablet Take 1 tablet (650 mg total) by mouth  daily. 180 tablet 1   umeclidinium-vilanterol (ANORO ELLIPTA) 62.5-25 MCG/ACT AEPB Inhale 1 puff into the lungs daily. 60 each 1   metoprolol tartrate (LOPRESSOR) 25 MG tablet Take 2 tablets (50 mg total) by mouth 2 (two) times daily. (Patient not taking: Reported on 03/26/2022) 120 tablet 1   No facility-administered medications prior to visit.     Allergies:   Aripiprazole, Clozapine, Benadryl [diphenhydramine hcl], Benztropine, Latuda [lurasidone hcl], Lorazepam, Remeron [mirtazapine], Haloperidol lactate, Keflex [cephalexin], Codeine, and Latex   Social History   Socioeconomic History   Marital status: Divorced    Spouse name: Not on file   Number of children: 3   Years of education: 14   Highest education level: Associate degree: academic program  Occupational History   Not on file  Tobacco Use   Smoking status: Every Day    Types: Cigarettes    Last attempt to quit: 03/15/2021    Years since quitting: 1.0   Smokeless tobacco: Never   Tobacco comments:    1cig a day  Vaping Use   Vaping Use: Former  Substance and Sexual Activity   Alcohol use: No    Alcohol/week: 0.0 standard drinks of alcohol   Drug use: No   Sexual activity: Not Currently    Partners: Male    Birth control/protection: Post-menopausal    Comment: BTL  Other Topics Concern   Not on file  Social History Narrative   Patient lives with her son in [place] in a one story apartment.    Son is patients caregiver (Abigail Wallace.)    Patient is wheelchair bound, except for bedtime.    Patient enjoys listing to music and visiting family.    Social Determinants of Health   Financial Resource Strain: Low Risk  (03/23/2022)   Overall Financial Resource Strain (CARDIA)    Difficulty of Paying Living Expenses: Not very hard  Food Insecurity: No Food Insecurity (03/23/2022)   Hunger Vital Sign    Worried About Running Out of Food in the Last Year: Never true    Ran Out of Food in the Last Year: Never true  Transportation  Needs: No Transportation Needs (03/23/2022)   PRAPARE - Hydrologist (Medical): No    Lack of Transportation (Non-Medical): No  Physical Activity: Inactive (12/27/2020)   Exercise  Vital Sign    Days of Exercise per Week: 0 days    Minutes of Exercise per Session: 0 min  Stress: No Stress Concern Present (12/27/2020)   [place]    Feeling of Stress : Only a little  Social Connections: Socially Isolated (12/27/2020)   Social Connection and Isolation Panel [NHANES]    Frequency of Communication with Friends and Family: More than three times a week    Frequency of Social Gatherings with Friends and Family: More than three times a week    Attends Religious Services: Never    Marine scientist or Organizations: No    Attends Archivist Meetings: Never    Marital Status: Divorced     Family History:  The patient's family history includes Diabetes type II in her son; Heart disease in her father and mother; Stroke in her sister.   ROS:   Please see the history of present illness.    All other systems reviewed and are negative.   PHYSICAL EXAM:   VS:  BP 130/72 (BP Location: Left Arm, Patient Position: Sitting, Cuff Size: Large)   Pulse 69   Ht '5\' 6"'$  (1.676 m)   Wt 268 lb (121.6 kg) Comment: Last weighed 2023 according to son  SpO2 97%   BMI 43.26 kg/m       General: Alert, oriented x3, no distress, morbidly obese.  Healthy left subclavian pacemaker site. Head: no evidence of trauma, PERRL, EOMI, no exophtalmos or lid lag, no myxedema, no xanthelasma; normal ears, nose and oropharynx Neck: normal jugular venous pulsations and no hepatojugular reflux; brisk carotid pulses without delay and no carotid bruits Chest: clear to auscultation, no signs of consolidation by percussion or palpation, normal fremitus, symmetrical and full respiratory excursions Cardiovascular: normal  position and quality of the apical impulse, regular rhythm, normal first and second heart sounds, no murmurs, rubs or gallops Abdomen: no tenderness or distention, no masses by palpation, no abnormal pulsatility or arterial bruits, normal bowel sounds, no hepatosplenomegaly Extremities: no clubbing, cyanosis or edema; 2+ radial, ulnar and brachial pulses bilaterally; 2+ right femoral, posterior tibial and dorsalis pedis pulses; 2+ left femoral, posterior tibial and dorsalis pedis pulses; no subclavian or femoral bruits Neurological: grossly nonfocal Psych: Normal mood and affect    Wt Readings from Last 3 Encounters:  03/26/22 268 lb (121.6 kg)  03/05/22 277 lb 5.4 oz (125.8 kg)  08/14/21 278 lb (126.1 kg)    Studies/Labs Reviewed:   ECHO 03/15/2022   1. Given severe LVH can consider further w/u for amyloid or hypertrophic  cardiomyopathy if clinically inidcated.   2. Distal septal and apical hypokinesis . Left ventricular ejection  fraction, by estimation, is 45 to 50%. The left ventricle has mildly  decreased function. The left ventricle has no regional wall motion  abnormalities. There is severe left ventricular  hypertrophy. Left ventricular diastolic parameters are indeterminate.   3. Pacing wires in RA/RV . Right ventricular systolic function is normal.  The right ventricular size is normal. There is normal pulmonary artery  systolic pressure.   4. Chordal SAM with no LVOT gradient . The mitral valve is abnormal.  Trivial mitral valve regurgitation. No evidence of mitral stenosis.   5. The aortic valve is tricuspid. There is mild calcification of the  aortic valve. There is mild thickening of the aortic valve. Aortic valve  regurgitation is not visualized. Aortic valve sclerosis is present, with  no  evidence of aortic valve stenosis.   6. The inferior vena cava is normal in size with greater than 50%  respiratory variability, suggesting right atrial pressure of 3 mmHg.    EKG:  EKG is not ordered today.  Personally reviewed the tracing from 03/15/2022 shows background atrial fibrillation with complete heart block and 100% ventricular paced rhythm.  The QRS duration is 170 ms.  Recent Labs: 02/22/2022: TSH 3.310 02/25/2022: ALT 15 03/15/2022: Magnesium 1.9 03/26/2022: BNP 395.0; BUN 27; Creatinine, Ser 1.82; Hemoglobin 11.8; Platelets 292; Potassium 4.8; Sodium 136  Lipid Panel     Component Value Date/Time   CHOL 153 06/16/2021 1223   TRIG 243 (H) 06/16/2021 1223   HDL 40 06/16/2021 1223   CHOLHDL 3.8 06/16/2021 1223   CHOLHDL 2.7 11/19/2010 0605   VLDL 15 11/19/2010 0605   LDLCALC 73 06/16/2021 1223   LDLDIRECT 80 03/20/2012 1510     ASSESSMENT:    1. Paroxysmal atrial fibrillation (HCC)   2. Chronic combined systolic and diastolic heart failure ([place])   3. Acquired thrombophilia ([place])   4. Essential hypertension   5. SSS (sick sinus syndrome) (HCC)   6. CHB (complete heart block) (HCC)   7. Pacemaker   8. Mixed hyperlipidemia   9. Infrarenal abdominal aortic aneurysm (AAA) without rupture ([place])   10. Morbid obesity ([place])   11. Well controlled type 2 diabetes mellitus ([place])   12. OSA (obstructive sleep apnea)   13. Paranoid schizophrenia ([place])         PLAN:  In order of problems listed above:  Afib: First recurrence since cardioversion January 2023. Will readdress after the HF is better compensated and her psych issues and meds are rebalanced.  I think it is more likely that the atrial fibrillation was a consequence of her decompensation, not because of it.  CHADSVasc 6-7 (age 49, gender, DM, HTN, HF, +/-PAD). Anticoagulation: Compliant with Eliquis, no bleeding problems. CHF: mildly decreased LVEF,  in part due to pacing induced dyssynchrony.  LVH is significant and could represent amyloidosis, but will defer the workup for same reasons as above. Plan PYP scan in a few weeks/months. Cannot exclude CAD, but she does not have angina  pectoris.  ECG nondiagnostic due to ventricular pacing.  She would not be a candidate for surgical revascularization due to her comorbid conditions and we are trying avoid contrast based procedure due to renal dysfunction. Until recently, heart failure has generally been well compensated until recently and EF remains greater than 40%, not yet indication for CRT-P.  The wall motion abnormalities described are due to profound RV pacing-related apex-to-base and septal-lateral dyssynchrony.  Check labs, including BNP today.  History of intolerance to ARB and we recently had to decrease her beta-blocker due to side effects.  Discussed SGLT2 inhibitors, but she has problems with fungal infections and has an indwelling catheter.  Has reactive airway disease so we are avoiding carvedilol.  HTN: controlled SSS: Fairly frequent atrial pacing.  Heart rate histogram is appropriate for her sedentary lifestyle. CHB:  Pacemaker dependent. Option for CRT-P upgrade, but she has had RV pacing for years without heart failure, EF is still greater than 40%. PPM:  Continue remote downloads every 3 months. HLP: Target LDL less than 70. AAA: Relatively small at 3.4 cm diameter, stable on yearly ultrasound.  Will be due for ultrasound later this year. Obesity: Morbidly obese with numerous comorbid condition. DM: Well-controlled OSA: Compliant with CPAP and denies daytime hypersomnolence. COPD : Has quit  smoking Schizophrenia: Recent worsening psychosis after she stopped taking her medicines.  Plan to get back on a year ago meds and stopped.   Medication Adjustments/Labs and Tests Ordered: Current medicines are reviewed at length with the patient today.  Concerns regarding medicines are outlined above.  Medication changes, Labs and Tests ordered today are listed in the Patient Instructions below. Patient Instructions  Medication Instructions:  No changes *If you need a refill on your cardiac medications before your next  appointment, please call your pharmacy*   Lab Work: CBC, BMET, BNP- today If you have labs (blood work) drawn today and your tests are completely normal, you will receive your results only by: [place] (if you have MyChart) OR A paper copy in the mail If you have any lab test that is abnormal or we need to change your treatment, we will call you to review the results.  Follow-Up: At [facility], you and your health needs are our priority.  As part of our continuing mission to provide you with exceptional heart care, we have created designated Provider Care Teams.  These Care Teams include your primary Cardiologist (physician) and Advanced Practice Providers (APPs -  Physician Assistants and Nurse Practitioners) who all work together to provide you with the care you need, when you need it.  We recommend signing up for the patient portal called "MyChart".  Sign up information is provided on this After Visit Summary.  MyChart is used to connect with patients for Virtual Visits (Telemedicine).  Patients are able to view lab/test results, encounter notes, upcoming appointments, etc.  Non-urgent messages can be sent to your provider as well.   To learn more about what you can do with MyChart, go to [email or web address].    Your next appointment:    3-4 months  Provider:   Sanda Klein, MD         Signed, Sanda Klein, MD  03/29/2022 10:42 AM    [address], [place], [place]  [address] Phone: [phone]; Fax: [phone]

## 2022-03-26 NOTE — Patient Instructions (Signed)
Medication Instructions:  No changes *If you need a refill on your cardiac medications before your next appointment, please call your pharmacy*   Lab Work: CBC, BMET, BNP- today If you have labs (blood work) drawn today and your tests are completely normal, you will receive your results only by: [place] (if you have MyChart) OR A paper copy in the mail If you have any lab test that is abnormal or we need to change your treatment, we will call you to review the results.  Follow-Up: At [facility], you and your health needs are our priority.  As part of our continuing mission to provide you with exceptional heart care, we have created designated Provider Care Teams.  These Care Teams include your primary Cardiologist (physician) and Advanced Practice Providers (APPs -  Physician Assistants and Nurse Practitioners) who all work together to provide you with the care you need, when you need it.  We recommend signing up for the patient portal called "MyChart".  Sign up information is provided on this After Visit Summary.  MyChart is used to connect with patients for Virtual Visits (Telemedicine).  Patients are able to view lab/test results, encounter notes, upcoming appointments, etc.  Non-urgent messages can be sent to your provider as well.   To learn more about what you can do with MyChart, go to [email or web address].    Your next appointment:    3-4 months  Provider:   Sanda Klein, MD

## 2022-03-26 NOTE — Telephone Encounter (Signed)
Please send referral to the following locations: [facility] [phone] at home [phone] home health [phone] home health [phone] home health [phone] healthcare [phone] at home [place] [phone] home care [phone].   [name], MD  Family Medicine Teaching Service

## 2022-03-27 LAB — CBC
Hematocrit: 38 % (ref 34.0–46.6)
Hemoglobin: 11.8 g/dL (ref 11.1–15.9)
MCH: 26.5 pg — ABNORMAL LOW (ref 26.6–33.0)
MCHC: 31.1 g/dL — ABNORMAL LOW (ref 31.5–35.7)
MCV: 85 fL (ref 79–97)
Platelets: 292 10*3/uL (ref 150–450)
RBC: 4.45 x10E6/uL (ref 3.77–5.28)
RDW: 15.8 % — ABNORMAL HIGH (ref 11.7–15.4)
WBC: 13.5 10*3/uL — ABNORMAL HIGH (ref 3.4–10.8)

## 2022-03-27 LAB — BASIC METABOLIC PANEL
BUN/Creatinine Ratio: 15 (ref 12–28)
BUN: 27 mg/dL (ref 8–27)
CO2: 21 mmol/L (ref 20–29)
Calcium: 10 mg/dL (ref 8.7–10.3)
Chloride: 100 mmol/L (ref 96–106)
Creatinine, Ser: 1.82 mg/dL — ABNORMAL HIGH (ref 0.57–1.00)
Glucose: 127 mg/dL — ABNORMAL HIGH (ref 70–99)
Potassium: 4.8 mmol/L (ref 3.5–5.2)
Sodium: 136 mmol/L (ref 134–144)
eGFR: 28 mL/min/{1.73_m2} — ABNORMAL LOW (ref >59)

## 2022-03-27 LAB — BRAIN NATRIURETIC PEPTIDE: BNP: 395 pg/mL — ABNORMAL HIGH (ref 0.0–100.0)

## 2022-03-29 ENCOUNTER — Encounter: Payer: Self-pay | Admitting: Cardiovascular Disease

## 2022-03-29 ENCOUNTER — Telehealth: Payer: Self-pay | Admitting: Licensed Clinical Social Worker

## 2022-03-29 NOTE — Telephone Encounter (Signed)
H&V Care Navigation CSW Progress Note  Clinical Social Worker contacted caregiver by phone (pt son Abigail Wallace, [place] on file) to f/u on home services. LCSW was able to reach him this morning at [phone]. He shares good news that [facility] able to hire RN that will hopefully mean that pt will start RN/PT services next week. He shares that they also have a video call with CAPS program tomorrow to assess for more intensive services for pt. Along with both of these care updates pt also had family come and visit which brought her joy. Pt son feeling optimistic at this time. I will pass this on to PCP and also f/u to see how CAP interview goes next week.   Patient is participating in a Managed Medicaid Plan:  No, UHC Medicare and Medicaid  [place]: No Food Insecurity (03/23/2022)  Housing: Low Risk  (03/23/2022)  Transportation Needs: No Transportation Needs (03/23/2022)  Utilities: Not At Risk (03/23/2022)  Alcohol Screen: Low Risk  (12/27/2020)  Depression (PHQ2-9): High Risk (02/22/2022)  Financial Resource Strain: Low Risk  (03/23/2022)  Physical Activity: Inactive (12/27/2020)  Social Connections: Socially Isolated (12/27/2020)  Stress: No Stress Concern Present (12/27/2020)  Tobacco Use: High Risk (03/29/2022)    Westley Hummer, MSW, LCSW Clinical Social Worker [place]  [phone]- work cell phone (preferred) [phone]- desk phone

## 2022-03-30 ENCOUNTER — Ambulatory Visit: Payer: Self-pay | Admitting: *Deleted

## 2022-03-30 NOTE — Patient Outreach (Signed)
  Care Coordination   Follow Up Visit Note   03/30/2022 Name: Itali Rasche MRN: [id number] DOB: Mar 30, 1945  Jaelle Fierstein Berdine Addison is a 77 y.o. year old female who sees Martyn Malay, MD for primary care. I spoke with  Selena Lesser today, son of [facility]   What matters to the patients health and wellness today?  Trying to make sure mom gets what she needs.    Goals Addressed             This Visit's Progress    COMPLETED: What should my mohter's level of care be? Find out this week.ient Stated       Interventions Today    Flowsheet Row Most Recent Value  Chronic Disease   Chronic disease during today's visit Congestive Heart Failure (CHF)  [Pt is engaged with Palliative Care now and CAP worker came and completed her assessment. They are hopeful to have a caregiver next month.]  General Interventions   General Interventions Discussed/Reviewed General Interventions Reviewed  [Son ensures to follow HF Action Plan.]                SDOH assessments and interventions completed:  Yes    Previously addressed.  Care Coordination Interventions:  Yes, provided   Follow up plan: No further intervention required.   Encounter Outcome:  Pt. Visit Completed   Kayleen Memos C. Myrtie Neither, MSN, [facility] Gerontological Nurse Practitioner [facility] Care Management [phone]

## 2022-04-03 DIAGNOSIS — J449 Chronic obstructive pulmonary disease, unspecified: Secondary | ICD-10-CM | POA: Diagnosis not present

## 2022-04-03 DIAGNOSIS — J811 Chronic pulmonary edema: Secondary | ICD-10-CM | POA: Diagnosis not present

## 2022-04-03 DIAGNOSIS — I5043 Acute on chronic combined systolic (congestive) and diastolic (congestive) heart failure: Secondary | ICD-10-CM | POA: Diagnosis not present

## 2022-04-03 DIAGNOSIS — D631 Anemia in chronic kidney disease: Secondary | ICD-10-CM | POA: Diagnosis not present

## 2022-04-03 DIAGNOSIS — E1122 Type 2 diabetes mellitus with diabetic chronic kidney disease: Secondary | ICD-10-CM | POA: Diagnosis not present

## 2022-04-03 DIAGNOSIS — N184 Chronic kidney disease, stage 4 (severe): Secondary | ICD-10-CM | POA: Diagnosis not present

## 2022-04-03 DIAGNOSIS — I130 Hypertensive heart and chronic kidney disease with heart failure and stage 1 through stage 4 chronic kidney disease, or unspecified chronic kidney disease: Secondary | ICD-10-CM | POA: Diagnosis not present

## 2022-04-04 DIAGNOSIS — N184 Chronic kidney disease, stage 4 (severe): Secondary | ICD-10-CM | POA: Diagnosis not present

## 2022-04-04 DIAGNOSIS — J449 Chronic obstructive pulmonary disease, unspecified: Secondary | ICD-10-CM | POA: Diagnosis not present

## 2022-04-04 DIAGNOSIS — D631 Anemia in chronic kidney disease: Secondary | ICD-10-CM | POA: Diagnosis not present

## 2022-04-04 DIAGNOSIS — E1122 Type 2 diabetes mellitus with diabetic chronic kidney disease: Secondary | ICD-10-CM | POA: Diagnosis not present

## 2022-04-04 DIAGNOSIS — I130 Hypertensive heart and chronic kidney disease with heart failure and stage 1 through stage 4 chronic kidney disease, or unspecified chronic kidney disease: Secondary | ICD-10-CM | POA: Diagnosis not present

## 2022-04-04 DIAGNOSIS — I5043 Acute on chronic combined systolic (congestive) and diastolic (congestive) heart failure: Secondary | ICD-10-CM | POA: Diagnosis not present

## 2022-04-05 ENCOUNTER — Telehealth: Payer: Self-pay | Admitting: Licensed Clinical Social Worker

## 2022-04-05 NOTE — Progress Notes (Addendum)
SUBJECTIVE:   CHIEF COMPLAINT: hospital follow up HPI:   Abigail Wallace is a 77 y.o.  with history notable for CKD, atrial fibrillation, COPD, tobacco use, and mood disorder presenting for follow up. She is joined by her son Abigail Wallace.  The patient was admitted for a heart failure exacerbation in February This resulted in overdiuresis. Additional conditions identified during her hospital stay included urinary retention, possible delirium,  and AKI. CT head showed a new 5 mm meningioma. She was seen by Psychiatry during her stay who recommended a neurology outpatient evaluation. She was placed on Haloperidol to which she had a poor reaction.   In regards to her CHF and atrial fibrillation, she underwent successful cardioversion last year. Did have recurrence of atrial fibrillation due to CHF exacerbation. Dr. Salena Saner planning for PYP scan for possible amyloid.  No dyspnea, edema, or CP. She is taking  lasix every other day. Also sent home on PRN Hydralazine for high BP. She has taken twice.  BP have been overall in excellent range 120-130 systolic. No falls at home.   Imaging findings did show a small meningioma. She reports no weakness or numbness. Her speech is back to near normal. Both she and son attribute speech changes to Haloperidol. She is now on Zyprexa. Haldol stopped 3/21. She was seen by Psychiatry, offered Cogentin. They are thinking about this. She is having a worsening tremor and mouth tardive dyskinesias.  Son is concerned about her memory. He also has some concerns about her staring spells, wondering if she has seizures. She reports a history of having an EEG previously. She is interested in seeing Dr. Perley Jain in [place] clinic. Son interested in seeing Neurology. No tonic clonic spells or history of known seizures.   In regards to her urinary retention, she sees [facility] in April. Foley is working well.   In regards to her DM, her glucoses have been averaging 129 over last 21  days. Did have a high this week. Last A1C 5.8 in February. Taking Trajenta as prescribed.    She has home health at home. She is able to stand a few times with walker. She was approved for CAPS. Her son is seeking guardianship. 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 Her current hospital bed is too high with the bed overlay. She is having difficulty getting from bed to chair due to height. This poses a fall risk. Hospital bed is needed due to her CHF--she requires the [facility] to be elevated for dyspnea.   She is due for AAA screening in December. Due for low dose CT in April.    Current Outpatient Medications:    acetaminophen (TYLENOL) 500 MG tablet, Take 1,000 mg by mouth every 6 (six) hours as needed for mild pain or headache., Disp: , Rfl:    albuterol (VENTOLIN HFA) 108 (90 Base) MCG/ACT inhaler, Inhale 2 puffs into the lungs every 6 (six) hours as needed for wheezing or shortness of breath., Disp: 18 g, Rfl: 3   allopurinol (ZYLOPRIM) 100 MG tablet, Take 1 tablet (100 mg total) by mouth daily., Disp: 90 tablet, Rfl: 3   amLODipine (NORVASC) 10 MG tablet, Take 1 tablet (10 mg total) by mouth daily., Disp: 90 tablet, Rfl: 3   apixaban (ELIQUIS) 5 MG TABS tablet, Take 1 tablet (5 mg total) by mouth 2 (two) times daily., Disp: 180 tablet, Rfl: 3   cholecalciferol (VITAMIN D3) 25 MCG (1000 UNIT) tablet, Take 1  tablet (1,000 Units total) by mouth daily., Disp: 90 tablet, Rfl: 3   diclofenac Sodium (VOLTAREN) 1 % GEL, Apply 2 g topically 4 (four) times daily as needed (pain)., Disp: 350 g, Rfl: 2   divalproex (DEPAKOTE) 250 MG DR tablet, Take 1 tablet (250 mg total) by mouth every 12 (twelve) hours., Disp: 60 tablet, Rfl: 1   furosemide (LASIX) 40 MG tablet, Take 1 tablet (40 mg total) by mouth every other day., Disp: 180 tablet, Rfl: 0   ipratropium-albuterol (DUONEB) 0.5-2.5 (3) MG/3ML SOLN, Take 3 mLs by nebulization every 6 (six) hours as needed., Disp: 360 mL,  Rfl: 3   linagliptin (TRADJENTA) 5 MG TABS tablet, Take 1 tablet (5 mg total) by mouth daily., Disp: 90 tablet, Rfl: 0   metoprolol tartrate (LOPRESSOR) 25 MG tablet, Take 2 tablets (50 mg total) by mouth 2 (two) times daily., Disp: 120 tablet, Rfl: 1   OLANZapine (ZYPREXA) 5 MG tablet, Take 5 mg by mouth at bedtime., Disp: , Rfl:    pravastatin (PRAVACHOL) 40 MG tablet, TAKE ONE TABLET BY MOUTH EVERY EVENING (Patient taking differently: Take 40 mg by mouth every evening.), Disp: 90 tablet, Rfl: 3   sodium bicarbonate 650 MG tablet, Take 1 tablet (650 mg total) by mouth daily., Disp: 180 tablet, Rfl: 1   triamcinolone cream (KENALOG) 0.1 %, Apply 1 Application topically 2 (two) times daily. Apply to L ear, Disp: 30 g, Rfl: 0   umeclidinium-vilanterol (ANORO ELLIPTA) 62.5-25 MCG/ACT AEPB, Inhale 1 puff into the lungs daily., Disp: 60 each, Rfl: 1   Blood Glucose Monitoring Suppl (ONETOUCH VERIO) w/Device KIT, Check blood sugar daily. E11.9, Disp: 1 kit, Rfl: 0   glucose blood (ONETOUCH VERIO) test strip, Please use to check blood sugar once daily. E11.9, Disp: 100 each, Rfl: 12   hydrocortisone cream 0.5 %, Apply 1 application topically 2 (two) times daily. Apply WITH Nystatin under left breast, Disp: 30 g, Rfl: 0   Incontinence Supplies MISC, 1 Units by Does not apply route as needed., Disp: 100 each, Rfl: prn   Lancet Device MISC, 1 Device by Does not apply route 3 (three) times a week., Disp: 1 each, Rfl: 11   mupirocin cream (BACTROBAN) 2 %, Apply 1 application. topically 2 (two) times daily. (Patient taking differently: Apply 1 application  topically 2 (two) times daily as needed (to affected areas- for irritation).), Disp: 15 g, Rfl: 3   nystatin (MYCOSTATIN/NYSTOP) powder, Apply 1 Application topically 3 (three) times daily. (Patient taking differently: Apply 1 Application topically 3 (three) times daily as needed (under the breasts and skin folds- for irritation).), Disp: 60 g, Rfl: 1    OneTouch Delica Lancets 33G MISC, Use to test once daily.  DX code:E11.9, Disp: 100 each, Rfl: 3   OVER THE COUNTER MEDICATION, Apply 1 application  topically See admin instructions. Caldesene Medicated Protecting Powder- Apply under the breasts and between the skin folds 2 times a day as needed for irritation, Disp: , Rfl:    senna (SENOKOT) 8.6 MG TABS tablet, Take 1 tablet (8.6 mg total) by mouth daily as needed for mild constipation., Disp: 120 tablet, Rfl: 0   PERTINENT  PMH / PSH/Family/Social History :  Memory impairment-concern for dementia Schizophrenia Atrial fibrillation CKD IIIB CHF with midrange EF (now 45%)  OBJECTIVE:   BP (!) 124/59   Pulse 66   Ht 5\' 6"  (1.676 m)   SpO2 100%   BMI 43.26 kg/m   Today's weight:  Review of  prior weights: Filed Weights    HEENT L ear with mild flaking on auricle, no scaling TM both WNL Mild cerum on R Talkative, appropriate R sided significant pill rolling tremor + abnormal tongue and  oral movements Speech near normal Cardiac: Regular rate and rhythm. Normal S1/S2. No murmurs, rubs, or gallops appreciated. Foley catheter site clean dry intact LE 1+ on R and 2+ on L  Psych: Pleasant and appropriate    ASSESSMENT/PLAN:   Meningioma [facility]) Referral to Neurology    Abdominal aortic ectasia (HCC) Ultrasound in December 2024  Discussed today   Chronic combined systolic and diastolic heart failure (HCC) Euvolemic today Continue Lasix BMP today  May have PYP scan in future   Essential hypertension Discontinued PRN Hydralazine At goal Continue current medications   Pulmonary nodules Repeat CT ordered and scheduled   Well controlled type 2 diabetes mellitus (HCC) A1C at goal, continue current medications  Lipid panel today CMP today   Post-polio muscle weakness Hospital bed with overlay is too high and represents a fall risk New low setting hospital bed ordered with overlay Will ask if we can send somewhere  other than [facility]   Urinary retention Resolved for now Has Urology follow up Discussed Foley Care     General Deconditioning - continue home PT - Son doing excellent work getting her to stand with walker  L ear contact dermatitis Rx kenalog Suspect contact due to drops or detergent   Concern for Dementia - Referral to Neurology for evaluation - Lower concern for seizure disorder - suspect in part most recent changes due to medications - Will message to  have scheduled in Geriatric clinic    Return in 1 month, discuss vaccines  Also discuss Cogentin again at follow up for TD--they believe it will take 2 months for Haloperidol effects to wear off (May)  Addendum: COPD, not  Patient reports issues with compliance with inhalers -thinks they cause side effects and has difficulty with deep inhalation  Has benefit from nebulizer use BID   Terisa Starr, MD  Family Medicine Teaching Service  [facility]

## 2022-04-05 NOTE — Telephone Encounter (Signed)
H&V Care Navigation CSW Progress Note  Clinical Social Worker contacted patient by phone (pt son [name], [place] on file) to f/u on home services. LCSW was able to reach him this morning at [phone]. He shares [name] has been in for evaluation and PT/RN should have scheduled visits starting next week. Video call with CAPS program last week to assess for more intensive services for pt. Pt was approved for waiver which will help cover additional care and costs. Pt son appreciative this finally manifested. Plans to go to PCP appt tomorrow pending no issues with transportation.   No additional questions/concerns at this time. Pt son encouraged moving forward to let me/our team know if we can be of any additional assistance. Will not actively follow at this time but remain available.   Patient is participating in a Managed Medicaid Plan:  No, UHC Medicare Dual Complete  [place]: No Food Insecurity (03/23/2022)  Housing: Low Risk  (03/23/2022)  Transportation Needs: No Transportation Needs (03/23/2022)  Utilities: Not At Risk (03/23/2022)  Alcohol Screen: Low Risk  (12/27/2020)  Depression (PHQ2-9): High Risk (02/22/2022)  Financial Resource Strain: Low Risk  (03/23/2022)  Physical Activity: Inactive (12/27/2020)  Social Connections: Socially Isolated (12/27/2020)  Stress: No Stress Concern Present (12/27/2020)  Tobacco Use: High Risk (03/29/2022)   Westley Hummer, MSW, LCSW Clinical Social Worker [place]  [phone]- work cell phone (preferred) [phone]- desk phone

## 2022-04-06 ENCOUNTER — Ambulatory Visit (INDEPENDENT_AMBULATORY_CARE_PROVIDER_SITE_OTHER): Payer: 59 | Admitting: Family Medicine

## 2022-04-06 ENCOUNTER — Other Ambulatory Visit: Payer: Self-pay

## 2022-04-06 ENCOUNTER — Encounter: Payer: Self-pay | Admitting: Family Medicine

## 2022-04-06 VITALS — BP 124/59 | HR 66 | Ht 66.0 in

## 2022-04-06 DIAGNOSIS — R918 Other nonspecific abnormal finding of lung field: Secondary | ICD-10-CM | POA: Diagnosis not present

## 2022-04-06 DIAGNOSIS — L259 Unspecified contact dermatitis, unspecified cause: Secondary | ICD-10-CM

## 2022-04-06 DIAGNOSIS — R339 Retention of urine, unspecified: Secondary | ICD-10-CM | POA: Insufficient documentation

## 2022-04-06 DIAGNOSIS — G3184 Mild cognitive impairment, so stated: Secondary | ICD-10-CM

## 2022-04-06 DIAGNOSIS — I10 Essential (primary) hypertension: Secondary | ICD-10-CM | POA: Diagnosis not present

## 2022-04-06 DIAGNOSIS — N1832 Chronic kidney disease, stage 3b: Secondary | ICD-10-CM | POA: Diagnosis not present

## 2022-04-06 DIAGNOSIS — F201 Disorganized schizophrenia: Secondary | ICD-10-CM | POA: Diagnosis not present

## 2022-04-06 DIAGNOSIS — M6281 Muscle weakness (generalized): Secondary | ICD-10-CM | POA: Diagnosis not present

## 2022-04-06 DIAGNOSIS — E119 Type 2 diabetes mellitus without complications: Secondary | ICD-10-CM | POA: Diagnosis not present

## 2022-04-06 DIAGNOSIS — I77811 Abdominal aortic ectasia: Secondary | ICD-10-CM

## 2022-04-06 DIAGNOSIS — D329 Benign neoplasm of meninges, unspecified: Secondary | ICD-10-CM | POA: Insufficient documentation

## 2022-04-06 DIAGNOSIS — I480 Paroxysmal atrial fibrillation: Secondary | ICD-10-CM | POA: Diagnosis not present

## 2022-04-06 DIAGNOSIS — B91 Sequelae of poliomyelitis: Secondary | ICD-10-CM

## 2022-04-06 DIAGNOSIS — I5042 Chronic combined systolic (congestive) and diastolic (congestive) heart failure: Secondary | ICD-10-CM

## 2022-04-06 MED ORDER — TRIAMCINOLONE ACETONIDE 0.1 % EX CREA: 1.0000 | TOPICAL_CREAM | Freq: Two times a day (BID) | CUTANEOUS | 0 refills | Status: DC

## 2022-04-06 NOTE — Assessment & Plan Note (Signed)
Hospital bed with overlay is too high and represents a fall risk New low setting hospital bed ordered with overlay Will ask if we can send somewhere other than [facility]

## 2022-04-06 NOTE — Assessment & Plan Note (Signed)
Discontinued PRN Hydralazine At goal Continue current medications

## 2022-04-06 NOTE — Assessment & Plan Note (Signed)
Repeat CT ordered and scheduled

## 2022-04-06 NOTE — Assessment & Plan Note (Signed)
A1C at goal, continue current medications

## 2022-04-06 NOTE — Assessment & Plan Note (Addendum)
Referral to Neurology

## 2022-04-06 NOTE — Assessment & Plan Note (Signed)
Ultrasound in December 2024  Discussed today

## 2022-04-06 NOTE — Assessment & Plan Note (Signed)
Resolved for now Has Urology follow up Discussed Foley Care

## 2022-04-06 NOTE — Assessment & Plan Note (Signed)
Euvolemic today Continue Lasix BMP today  May have PYP scan in future

## 2022-04-06 NOTE — Patient Instructions (Addendum)
It was wonderful to see you today.  Please bring ALL of your medications with you to every visit.   Today we talked about:  - For your meningioma: - I have referred you to Neurology  They will also discuss your seizure concern  Please STOP hydralazine for blood pressure   We will check blood work today I will call you with results  You will be called about seeing Dr. McDiarmid  Please let me know if the Foley is causing issues  GREAT WORK ON HEALING THE BEDSORES    Please follow up in 1 months   Thank you for choosing [facility].   Please call [phone] with any questions about today's appointment.  Please be sure to schedule follow up at the front  desk before you leave today.   [name], MD  Family Medicine

## 2022-04-07 ENCOUNTER — Telehealth: Payer: Self-pay | Admitting: Family Medicine

## 2022-04-07 DIAGNOSIS — N1832 Chronic kidney disease, stage 3b: Secondary | ICD-10-CM

## 2022-04-07 LAB — COMPREHENSIVE METABOLIC PANEL
ALT: 6 IU/L (ref 0–32)
AST: 13 IU/L (ref 0–40)
Albumin/Globulin Ratio: 1.5 (ref 1.2–2.2)
Albumin: 3.8 g/dL (ref 3.8–4.8)
Alkaline Phosphatase: 64 IU/L (ref 44–121)
BUN/Creatinine Ratio: 21 (ref 12–28)
BUN: 35 mg/dL — ABNORMAL HIGH (ref 8–27)
Bilirubin Total: 0.2 mg/dL (ref 0.0–1.2)
CO2: 19 mmol/L — ABNORMAL LOW (ref 20–29)
Calcium: 9.8 mg/dL (ref 8.7–10.3)
Chloride: 100 mmol/L (ref 96–106)
Creatinine, Ser: 1.66 mg/dL — ABNORMAL HIGH (ref 0.57–1.00)
Globulin, Total: 2.6 g/dL (ref 1.5–4.5)
Glucose: 99 mg/dL (ref 70–99)
Potassium: 4.7 mmol/L (ref 3.5–5.2)
Sodium: 135 mmol/L (ref 134–144)
Total Protein: 6.4 g/dL (ref 6.0–8.5)
eGFR: 31 mL/min/{1.73_m2} — ABNORMAL LOW (ref >59)

## 2022-04-07 LAB — LIPID PANEL
Chol/HDL Ratio: 3.1 ratio (ref 0.0–4.4)
Cholesterol, Total: 149 mg/dL (ref 100–199)
HDL: 48 mg/dL (ref >39)
LDL Chol Calc (NIH): 69 mg/dL (ref 0–99)
Triglycerides: 195 mg/dL — ABNORMAL HIGH (ref 0–149)
VLDL Cholesterol Cal: 32 mg/dL (ref 5–40)

## 2022-04-07 LAB — CBC WITH DIFFERENTIAL/PLATELET
Basophils Absolute: 0.1 10*3/uL (ref 0.0–0.2)
Basos: 1 %
EOS (ABSOLUTE): 0.4 10*3/uL (ref 0.0–0.4)
Eos: 3 %
Hematocrit: 35.6 % (ref 34.0–46.6)
Hemoglobin: 11.4 g/dL (ref 11.1–15.9)
Immature Grans (Abs): 0.1 10*3/uL (ref 0.0–0.1)
Immature Granulocytes: 1 %
Lymphocytes Absolute: 3.2 10*3/uL — ABNORMAL HIGH (ref 0.7–3.1)
Lymphs: 30 %
MCH: 27 pg (ref 26.6–33.0)
MCHC: 32 g/dL (ref 31.5–35.7)
MCV: 84 fL (ref 79–97)
Monocytes Absolute: 0.8 10*3/uL (ref 0.1–0.9)
Monocytes: 8 %
Neutrophils Absolute: 6.2 10*3/uL (ref 1.4–7.0)
Neutrophils: 57 %
Platelets: 312 10*3/uL (ref 150–450)
RBC: 4.23 x10E6/uL (ref 3.77–5.28)
RDW: 15.9 % — ABNORMAL HIGH (ref 11.7–15.4)
WBC: 10.8 10*3/uL (ref 3.4–10.8)

## 2022-04-07 MED ORDER — SODIUM BICARBONATE 650 MG PO TABS: 650.0000 mg | ORAL_TABLET | Freq: Two times a day (BID) | ORAL | 3 refills | Status: DC

## 2022-04-07 NOTE — Telephone Encounter (Signed)
Called patient and son. Discussed good lipid panel and normal CBC, slight lymphocyte elevation, monitor. Increased sodium bicarbonate to BID.   All questions answered [name], MD  [facility] Medicine Teaching Service

## 2022-04-09 ENCOUNTER — Telehealth: Payer: Self-pay

## 2022-04-09 ENCOUNTER — Encounter: Payer: Self-pay | Admitting: Family Medicine

## 2022-04-09 DIAGNOSIS — E1122 Type 2 diabetes mellitus with diabetic chronic kidney disease: Secondary | ICD-10-CM | POA: Diagnosis not present

## 2022-04-09 DIAGNOSIS — I130 Hypertensive heart and chronic kidney disease with heart failure and stage 1 through stage 4 chronic kidney disease, or unspecified chronic kidney disease: Secondary | ICD-10-CM | POA: Diagnosis not present

## 2022-04-09 DIAGNOSIS — D631 Anemia in chronic kidney disease: Secondary | ICD-10-CM | POA: Diagnosis not present

## 2022-04-09 DIAGNOSIS — J449 Chronic obstructive pulmonary disease, unspecified: Secondary | ICD-10-CM | POA: Diagnosis not present

## 2022-04-09 DIAGNOSIS — I5043 Acute on chronic combined systolic (congestive) and diastolic (congestive) heart failure: Secondary | ICD-10-CM | POA: Diagnosis not present

## 2022-04-09 DIAGNOSIS — N184 Chronic kidney disease, stage 4 (severe): Secondary | ICD-10-CM | POA: Diagnosis not present

## 2022-04-09 NOTE — Telephone Encounter (Signed)
Called and dicussed. No dyspnea. Recommend increasing nebulizers, monitoring for symptoms. Has upcoming imaging.  Maria to draw labs next week--BMP  Verdis Frederickson to remove and replace this Foley and draw culture off new Foley  [name], MD  [facility] Medicine Teaching Service

## 2022-04-09 NOTE — Telephone Encounter (Signed)
[facility] PT calls nurse line requesting verbal orders for [facility] as follows.   1x a week for 1 week  2x a week for 3 weeks  1x a week for 3 weeks   Verbal order given.

## 2022-04-09 NOTE — Telephone Encounter (Signed)
Received call from [facility] regarding patient. Verdis Frederickson reports that on exam today, patient had wheezing on right side. Patient is not having SHOB and SpO2 is 99% on room air.   She is requesting antibiotic for possible UTI. States that patient has foley catheter and that urine has been cloudy in appearance. Also reports abdominal pain. Denies fever. Advised that patient would likely need additional appointment for follow up assessment. Asking that message be sent to provider.   Please advise.   Talbot Grumbling, RN

## 2022-04-10 DIAGNOSIS — R8290 Unspecified abnormal findings in urine: Secondary | ICD-10-CM | POA: Diagnosis not present

## 2022-04-10 DIAGNOSIS — I5043 Acute on chronic combined systolic (congestive) and diastolic (congestive) heart failure: Secondary | ICD-10-CM | POA: Diagnosis not present

## 2022-04-10 DIAGNOSIS — E1122 Type 2 diabetes mellitus with diabetic chronic kidney disease: Secondary | ICD-10-CM | POA: Diagnosis not present

## 2022-04-10 DIAGNOSIS — D631 Anemia in chronic kidney disease: Secondary | ICD-10-CM | POA: Diagnosis not present

## 2022-04-10 DIAGNOSIS — I130 Hypertensive heart and chronic kidney disease with heart failure and stage 1 through stage 4 chronic kidney disease, or unspecified chronic kidney disease: Secondary | ICD-10-CM | POA: Diagnosis not present

## 2022-04-10 DIAGNOSIS — N184 Chronic kidney disease, stage 4 (severe): Secondary | ICD-10-CM | POA: Diagnosis not present

## 2022-04-10 DIAGNOSIS — J449 Chronic obstructive pulmonary disease, unspecified: Secondary | ICD-10-CM | POA: Diagnosis not present

## 2022-04-10 DIAGNOSIS — R109 Unspecified abdominal pain: Secondary | ICD-10-CM | POA: Diagnosis not present

## 2022-04-12 ENCOUNTER — Ambulatory Visit (HOSPITAL_COMMUNITY): Payer: 59

## 2022-04-12 DIAGNOSIS — D631 Anemia in chronic kidney disease: Secondary | ICD-10-CM | POA: Diagnosis not present

## 2022-04-12 DIAGNOSIS — N184 Chronic kidney disease, stage 4 (severe): Secondary | ICD-10-CM | POA: Diagnosis not present

## 2022-04-12 DIAGNOSIS — I5043 Acute on chronic combined systolic (congestive) and diastolic (congestive) heart failure: Secondary | ICD-10-CM | POA: Diagnosis not present

## 2022-04-12 DIAGNOSIS — J449 Chronic obstructive pulmonary disease, unspecified: Secondary | ICD-10-CM | POA: Diagnosis not present

## 2022-04-12 DIAGNOSIS — I130 Hypertensive heart and chronic kidney disease with heart failure and stage 1 through stage 4 chronic kidney disease, or unspecified chronic kidney disease: Secondary | ICD-10-CM | POA: Diagnosis not present

## 2022-04-12 DIAGNOSIS — E1122 Type 2 diabetes mellitus with diabetic chronic kidney disease: Secondary | ICD-10-CM | POA: Diagnosis not present

## 2022-04-13 DIAGNOSIS — J449 Chronic obstructive pulmonary disease, unspecified: Secondary | ICD-10-CM | POA: Diagnosis not present

## 2022-04-13 DIAGNOSIS — I5043 Acute on chronic combined systolic (congestive) and diastolic (congestive) heart failure: Secondary | ICD-10-CM | POA: Diagnosis not present

## 2022-04-13 DIAGNOSIS — D631 Anemia in chronic kidney disease: Secondary | ICD-10-CM | POA: Diagnosis not present

## 2022-04-13 DIAGNOSIS — N184 Chronic kidney disease, stage 4 (severe): Secondary | ICD-10-CM | POA: Diagnosis not present

## 2022-04-13 DIAGNOSIS — E1122 Type 2 diabetes mellitus with diabetic chronic kidney disease: Secondary | ICD-10-CM | POA: Diagnosis not present

## 2022-04-13 DIAGNOSIS — I130 Hypertensive heart and chronic kidney disease with heart failure and stage 1 through stage 4 chronic kidney disease, or unspecified chronic kidney disease: Secondary | ICD-10-CM | POA: Diagnosis not present

## 2022-04-16 ENCOUNTER — Encounter: Payer: Self-pay | Admitting: Family Medicine

## 2022-04-16 DIAGNOSIS — N184 Chronic kidney disease, stage 4 (severe): Secondary | ICD-10-CM | POA: Diagnosis not present

## 2022-04-16 DIAGNOSIS — E1122 Type 2 diabetes mellitus with diabetic chronic kidney disease: Secondary | ICD-10-CM | POA: Diagnosis not present

## 2022-04-16 DIAGNOSIS — D631 Anemia in chronic kidney disease: Secondary | ICD-10-CM | POA: Diagnosis not present

## 2022-04-16 DIAGNOSIS — J449 Chronic obstructive pulmonary disease, unspecified: Secondary | ICD-10-CM | POA: Diagnosis not present

## 2022-04-16 DIAGNOSIS — I130 Hypertensive heart and chronic kidney disease with heart failure and stage 1 through stage 4 chronic kidney disease, or unspecified chronic kidney disease: Secondary | ICD-10-CM | POA: Diagnosis not present

## 2022-04-16 DIAGNOSIS — I480 Paroxysmal atrial fibrillation: Secondary | ICD-10-CM

## 2022-04-16 DIAGNOSIS — I5043 Acute on chronic combined systolic (congestive) and diastolic (congestive) heart failure: Secondary | ICD-10-CM | POA: Diagnosis not present

## 2022-04-16 MED ORDER — VITAMIN D 25 MCG (1000 UNIT) PO TABS: 1000.0000 [IU] | ORAL_TABLET | Freq: Every day | ORAL | 3 refills | Status: DC

## 2022-04-16 MED ORDER — ALLOPURINOL 100 MG PO TABS: 100.0000 mg | ORAL_TABLET | Freq: Every day | ORAL | 3 refills | Status: DC

## 2022-04-16 MED ORDER — APIXABAN 5 MG PO TABS: 5.0000 mg | ORAL_TABLET | Freq: Two times a day (BID) | ORAL | 3 refills | Status: DC

## 2022-04-16 NOTE — Addendum Note (Signed)
Addended by: [name], Destany Severns on: 04/16/2022 12:04 PM   Modules accepted: Orders

## 2022-04-16 NOTE — Telephone Encounter (Signed)
Dr. [name] the previous home health referral didn't specify OT or speech in the order.  Please place a new referral for these specialities if needed and I can send a message to the [place] letting her know that this has been done.  PT verbal orders were given on 04/09/22.   Duvall Comes,CMA

## 2022-04-17 ENCOUNTER — Other Ambulatory Visit: Payer: 59

## 2022-04-17 VITALS — BP 156/58 | HR 70 | Temp 96.8°F

## 2022-04-17 DIAGNOSIS — E1122 Type 2 diabetes mellitus with diabetic chronic kidney disease: Secondary | ICD-10-CM | POA: Diagnosis not present

## 2022-04-17 DIAGNOSIS — N184 Chronic kidney disease, stage 4 (severe): Secondary | ICD-10-CM | POA: Diagnosis not present

## 2022-04-17 DIAGNOSIS — Z515 Encounter for palliative care: Secondary | ICD-10-CM

## 2022-04-17 DIAGNOSIS — J449 Chronic obstructive pulmonary disease, unspecified: Secondary | ICD-10-CM | POA: Diagnosis not present

## 2022-04-17 DIAGNOSIS — D631 Anemia in chronic kidney disease: Secondary | ICD-10-CM | POA: Diagnosis not present

## 2022-04-17 DIAGNOSIS — I130 Hypertensive heart and chronic kidney disease with heart failure and stage 1 through stage 4 chronic kidney disease, or unspecified chronic kidney disease: Secondary | ICD-10-CM | POA: Diagnosis not present

## 2022-04-17 DIAGNOSIS — I5043 Acute on chronic combined systolic (congestive) and diastolic (congestive) heart failure: Secondary | ICD-10-CM | POA: Diagnosis not present

## 2022-04-17 NOTE — Progress Notes (Signed)
PATIENT NAME: Abigail Wallace DOB: 02-Jun-1945 MRN: [id number]  PRIMARY CARE PROVIDER: Westley Chandler, MD  RESPONSIBLE PARTY:  Acct ID - Guarantor Home Phone Work Phone Relationship Acct Type  [id number] - [name],REB* [phone]  Self P/F     [address] [place], [place] [address]    Palliative Care Follow Up Encounter Note    Completed home visit with [name], SW. son [name] also present.     Today's visit:     Respiratory: not currently on O2 at this time; uses O2 @ 2.5L only when she needs it, is SOB and at bedtime   Cardiac: CHF; son measures ankles daily; no edema noted at this time   Cognitive: alert and oriented; conversationally appropriate; pt asking a lot of questions in rapid succession   Appetite: appetite is back; now eats about 3,500 calories daily   GI/GU: Foley draining clear yellow urine; BM 2-every 3 days; if none, son gives Senna   Mobility: uses a w/c to get around; in bed during this entire visit   ADLs: [name] will be coming with PT,OT and ; also still has aide coming to the home   Sleeping Pattern: sleeps about 5-6 hrs nightly   Pain: knee and spine pain that is 10/10; has been taking 4000mg  of Tylenol because her knees have been hurting more than usual     Goals of Care: To stay in the home with son as long as possible       CODE STATUS: Full Code ADVANCED DIRECTIVES: N MOST FORM: N PPS: 30%   PHYSICAL EXAM:   VITALS: Today's Vitals   04/17/22 1431  BP: (!) 156/58  Pulse: 70  Temp: (!) 96.8 F (36 C)  TempSrc: Temporal  SpO2: 95%  PainSc: 10-Worst pain ever  PainLoc: Knee    LUNGS: clear to auscultation , decreased breath sounds CARDIAC: Cor RRR EXTREMITIES: no edema SKIN: warm, dry NEURO: weakness       Ed Rayson Clementeen Graham, LPN

## 2022-04-17 NOTE — Progress Notes (Signed)
COMMUNITY PALLIATIVE CARE SW NOTE  PATIENT NAME: Abigail Wallace DOB: Sep 14, 1945 MRN: [id number]  PRIMARY CARE PROVIDER: Martyn Malay, MD  RESPONSIBLE PARTY:  Acct ID - Guarantor Home Phone Work Phone Relationship Acct Type  [id number] - [name],REB* [phone]  Self P/F     [address], [place], [place] [address]   Palliative Care Visit/Clinical Social Work Visit  SW and nurse-D. Georgann Housekeeper completed a visit with patient at her home. Patient's son-[name] was present with patient. Patient was in bed, awake and alert. She had just finished having physical therapy. She report that her physical therapy is going well.  Patient report having knee and back pain. Patient rated her knee pain over a 10, which she manages with a rub (Voltaran) and taking 6-8 Tylenols.  She report that her appetite is good, eating about 3500 calories a day. However her son is monitoring her intake. Patient's catheter came out, but replaced. The nurse from [place] continues to come and visit. Patient continues to use o2 at night and PRN. Patient is not sleeping all night,  5/6 hours at a time.   Patient had CAPC-D assessment on March 15th. She is expecting services to begin over the next 30 days. She continues to receive PCS services.    Social History   Tobacco Use   Smoking status: Every Day    Types: Cigarettes    Last attempt to quit: 03/15/2021    Years since quitting: 1.0   Smokeless tobacco: Never   Tobacco comments:    1cig a day  Substance Use Topics   Alcohol use: No    Alcohol/week: 0.0 standard drinks of alcohol    CODE STATUS: Full Code ADVANCED DIRECTIVES: No MOST FORM COMPLETE:  No HOSPICE EDUCATION PROVIDED: No  Duration of visit and documentation: 60 minutes  Kimisha Eunice, LCSW

## 2022-04-18 ENCOUNTER — Telehealth: Payer: Self-pay

## 2022-04-18 NOTE — Telephone Encounter (Signed)
-----   Message from Martyn Malay, MD sent at 04/06/2022 12:00 PM EDT ----- Regarding: [facility] Cherrie Distance,  Dr. McDiarmid had previously approved this patient for Geri clinic--could you please schedule?  Thanks! CB

## 2022-04-18 NOTE — Telephone Encounter (Signed)
-----   Message from Martyn Malay, MD sent at 04/06/2022 12:00 PM EDT ----- Regarding: [facility] Cherrie Distance,  Dr. McDiarmid had previously approved this patient for Geri clinic--could you please schedule?  Thanks! CB

## 2022-04-18 NOTE — Telephone Encounter (Signed)
Called son about swelling and Foley catheter.  Swelling: Has intermittent swelling on L side. She is eating more and eating more salty foods. This has occurred before. No chest pain or dyspnea. LE is about the same. Swelling isolate to L wrist. Will monitor and measure tomorrow  Discussed Foley care--will see Urology tomorrow  He will send wrist measurements tomorrow   [name], MD  Family Medicine Teaching Service

## 2022-04-18 NOTE — Telephone Encounter (Signed)
Spoke with patients son Jori Moll. He stated that he is not interested in the [facility] at this time. That He and Dr. [name] had talked about that one time before. He did want to know what to do about patient swelling. If Dr. [name] could give him a call about that. He stated that he did send a [employer] regarding that. Salvatore Marvel, CMA

## 2022-04-19 DIAGNOSIS — N3020 Other chronic cystitis without hematuria: Secondary | ICD-10-CM | POA: Diagnosis not present

## 2022-04-19 DIAGNOSIS — I5043 Acute on chronic combined systolic (congestive) and diastolic (congestive) heart failure: Secondary | ICD-10-CM | POA: Diagnosis not present

## 2022-04-19 DIAGNOSIS — R338 Other retention of urine: Secondary | ICD-10-CM | POA: Diagnosis not present

## 2022-04-19 DIAGNOSIS — J449 Chronic obstructive pulmonary disease, unspecified: Secondary | ICD-10-CM | POA: Diagnosis not present

## 2022-04-19 DIAGNOSIS — E1122 Type 2 diabetes mellitus with diabetic chronic kidney disease: Secondary | ICD-10-CM | POA: Diagnosis not present

## 2022-04-19 DIAGNOSIS — N184 Chronic kidney disease, stage 4 (severe): Secondary | ICD-10-CM | POA: Diagnosis not present

## 2022-04-19 DIAGNOSIS — I130 Hypertensive heart and chronic kidney disease with heart failure and stage 1 through stage 4 chronic kidney disease, or unspecified chronic kidney disease: Secondary | ICD-10-CM | POA: Diagnosis not present

## 2022-04-19 DIAGNOSIS — D631 Anemia in chronic kidney disease: Secondary | ICD-10-CM | POA: Diagnosis not present

## 2022-04-20 DIAGNOSIS — D631 Anemia in chronic kidney disease: Secondary | ICD-10-CM | POA: Diagnosis not present

## 2022-04-20 DIAGNOSIS — I5043 Acute on chronic combined systolic (congestive) and diastolic (congestive) heart failure: Secondary | ICD-10-CM | POA: Diagnosis not present

## 2022-04-20 DIAGNOSIS — I130 Hypertensive heart and chronic kidney disease with heart failure and stage 1 through stage 4 chronic kidney disease, or unspecified chronic kidney disease: Secondary | ICD-10-CM | POA: Diagnosis not present

## 2022-04-20 DIAGNOSIS — E1122 Type 2 diabetes mellitus with diabetic chronic kidney disease: Secondary | ICD-10-CM | POA: Diagnosis not present

## 2022-04-20 DIAGNOSIS — J449 Chronic obstructive pulmonary disease, unspecified: Secondary | ICD-10-CM | POA: Diagnosis not present

## 2022-04-20 DIAGNOSIS — N184 Chronic kidney disease, stage 4 (severe): Secondary | ICD-10-CM | POA: Diagnosis not present

## 2022-04-20 NOTE — Telephone Encounter (Signed)
Returned call to [name]. Advised of provider message. He will follow up on Monday if swelling has not improved.   Veronda Prude, RN

## 2022-04-20 NOTE — Telephone Encounter (Signed)
Please advise can take an additional Lasix dose today then return to usual daily dose over weekend. Please let me know if they have additoinal questions Terisa Starr, MD  [facility] Medicine Teaching Service

## 2022-04-20 NOTE — Telephone Encounter (Signed)
Patient's son left message on nurse line regarding swelling in extremities.   Reports the following measurements: 04/19/22: LLE: 19.5 cm L wrist: 19 cm  RLE: 23 cm R wrist: 21 cm   04/20/22: LLE:21.5 cm  L wrist: 23.5 cm RLE:23.5 cm  R wrist: 20.5 cm  He is asking if Lasix dosage should be increased/ next steps for continued swelling.   Please advise.   Veronda Prude, RN

## 2022-04-23 ENCOUNTER — Telehealth: Payer: Self-pay | Admitting: Family Medicine

## 2022-04-23 DIAGNOSIS — D631 Anemia in chronic kidney disease: Secondary | ICD-10-CM | POA: Diagnosis not present

## 2022-04-23 DIAGNOSIS — E1122 Type 2 diabetes mellitus with diabetic chronic kidney disease: Secondary | ICD-10-CM | POA: Diagnosis not present

## 2022-04-23 DIAGNOSIS — I130 Hypertensive heart and chronic kidney disease with heart failure and stage 1 through stage 4 chronic kidney disease, or unspecified chronic kidney disease: Secondary | ICD-10-CM | POA: Diagnosis not present

## 2022-04-23 DIAGNOSIS — N184 Chronic kidney disease, stage 4 (severe): Secondary | ICD-10-CM | POA: Diagnosis not present

## 2022-04-23 DIAGNOSIS — I5043 Acute on chronic combined systolic (congestive) and diastolic (congestive) heart failure: Secondary | ICD-10-CM | POA: Diagnosis not present

## 2022-04-23 DIAGNOSIS — J449 Chronic obstructive pulmonary disease, unspecified: Secondary | ICD-10-CM | POA: Diagnosis not present

## 2022-04-23 NOTE — Telephone Encounter (Signed)
Called and discussed. Lesions appear to be blisters, due to pressure. Will monitor for now, offload, apply vaseline.  CBG and BP have been appropriate. He will monitor for now.  Edema in wrists is improved Terisa Starr, MD  [facility] Medicine Teaching Service

## 2022-04-24 ENCOUNTER — Ambulatory Visit: Payer: 59 | Admitting: Neurology

## 2022-04-25 DIAGNOSIS — J449 Chronic obstructive pulmonary disease, unspecified: Secondary | ICD-10-CM | POA: Diagnosis not present

## 2022-04-25 DIAGNOSIS — D631 Anemia in chronic kidney disease: Secondary | ICD-10-CM | POA: Diagnosis not present

## 2022-04-25 DIAGNOSIS — N184 Chronic kidney disease, stage 4 (severe): Secondary | ICD-10-CM | POA: Diagnosis not present

## 2022-04-25 DIAGNOSIS — E1122 Type 2 diabetes mellitus with diabetic chronic kidney disease: Secondary | ICD-10-CM | POA: Diagnosis not present

## 2022-04-25 DIAGNOSIS — I5043 Acute on chronic combined systolic (congestive) and diastolic (congestive) heart failure: Secondary | ICD-10-CM | POA: Diagnosis not present

## 2022-04-25 DIAGNOSIS — I130 Hypertensive heart and chronic kidney disease with heart failure and stage 1 through stage 4 chronic kidney disease, or unspecified chronic kidney disease: Secondary | ICD-10-CM | POA: Diagnosis not present

## 2022-04-26 ENCOUNTER — Ambulatory Visit (HOSPITAL_COMMUNITY)
Admission: RE | Admit: 2022-04-26 | Discharge: 2022-04-26 | Disposition: A | Discharge status: Home / Self Care | Payer: 59 | Source: Ambulatory Visit | Attending: Family Medicine | Admitting: Family Medicine

## 2022-04-26 DIAGNOSIS — J439 Emphysema, unspecified: Secondary | ICD-10-CM | POA: Diagnosis not present

## 2022-04-26 DIAGNOSIS — R918 Other nonspecific abnormal finding of lung field: Secondary | ICD-10-CM | POA: Insufficient documentation

## 2022-04-26 DIAGNOSIS — J90 Pleural effusion, not elsewhere classified: Secondary | ICD-10-CM | POA: Diagnosis not present

## 2022-04-27 ENCOUNTER — Telehealth: Payer: Self-pay

## 2022-04-27 DIAGNOSIS — J449 Chronic obstructive pulmonary disease, unspecified: Secondary | ICD-10-CM | POA: Diagnosis not present

## 2022-04-27 DIAGNOSIS — I5043 Acute on chronic combined systolic (congestive) and diastolic (congestive) heart failure: Secondary | ICD-10-CM | POA: Diagnosis not present

## 2022-04-27 DIAGNOSIS — N184 Chronic kidney disease, stage 4 (severe): Secondary | ICD-10-CM | POA: Diagnosis not present

## 2022-04-27 DIAGNOSIS — I130 Hypertensive heart and chronic kidney disease with heart failure and stage 1 through stage 4 chronic kidney disease, or unspecified chronic kidney disease: Secondary | ICD-10-CM | POA: Diagnosis not present

## 2022-04-27 DIAGNOSIS — D631 Anemia in chronic kidney disease: Secondary | ICD-10-CM | POA: Diagnosis not present

## 2022-04-27 DIAGNOSIS — E1122 Type 2 diabetes mellitus with diabetic chronic kidney disease: Secondary | ICD-10-CM | POA: Diagnosis not present

## 2022-04-27 NOTE — Telephone Encounter (Signed)
Judie Grieve [facility] OT calls nurse line requesting verbal orders as follows.   1x a week for 1 week  2x a week for 1 week  1x a week for 2 weeks  Verbal order given.

## 2022-04-30 ENCOUNTER — Ambulatory Visit: Payer: 59 | Admitting: Neurology

## 2022-04-30 ENCOUNTER — Telehealth: Payer: Self-pay | Admitting: Family Medicine

## 2022-04-30 DIAGNOSIS — J189 Pneumonia, unspecified organism: Secondary | ICD-10-CM

## 2022-04-30 DIAGNOSIS — M546 Pain in thoracic spine: Secondary | ICD-10-CM

## 2022-04-30 MED ORDER — AMOXICILLIN-POT CLAVULANATE 500-125 MG PO TABS
1.0000 | ORAL_TABLET | Freq: Two times a day (BID) | ORAL | 0 refills | Status: DC
Start: 2022-04-30 — End: 2022-07-05

## 2022-04-30 MED ORDER — IPRATROPIUM-ALBUTEROL 0.5-2.5 (3) MG/3ML IN SOLN: 3.0000 mL | Freq: Four times a day (QID) | RESPIRATORY_TRACT | 3 refills | Status: DC | PRN

## 2022-04-30 MED ORDER — DOXYCYCLINE HYCLATE 100 MG PO TABS
100.0000 mg | ORAL_TABLET | Freq: Two times a day (BID) | ORAL | 0 refills | Status: DC
Start: 2022-04-30 — End: 2022-07-06

## 2022-04-30 MED ORDER — DICLOFENAC SODIUM 1 % EX GEL: 2.0000 g | Freq: Four times a day (QID) | CUTANEOUS | 2 refills | Status: DC | PRN

## 2022-04-30 NOTE — Telephone Encounter (Signed)
[name] returns call to nurse line. He apologizes for missing provider's call. States that he will be free to discuss results anytime after 1:00 pm.   Best contact number is [phone].  Veronda Prude, RN

## 2022-04-30 NOTE — Telephone Encounter (Signed)
Called with results. CT shows linear opacity. SpO2 95%, has mild cough. Given findings will treat for CAP. Discussed this and reviewed return precautions.   Missed Neurology appointment due to transport. This has been rescheduled.   Terisa Starr, MD  Family Medicine Teaching Service

## 2022-04-30 NOTE — Addendum Note (Signed)
Addended by: Manson Passey, Sahvannah Rieser on: 04/30/2022 03:12 PM   Modules accepted: Orders

## 2022-04-30 NOTE — Telephone Encounter (Signed)
Attempted to call son and patient about CT chest.   If calls back, please find a good time to call today.   Terisa Starr, MD  Family Medicine Teaching Service

## 2022-05-01 ENCOUNTER — Telehealth: Payer: Self-pay

## 2022-05-01 DIAGNOSIS — E1122 Type 2 diabetes mellitus with diabetic chronic kidney disease: Secondary | ICD-10-CM | POA: Diagnosis not present

## 2022-05-01 DIAGNOSIS — I5043 Acute on chronic combined systolic (congestive) and diastolic (congestive) heart failure: Secondary | ICD-10-CM | POA: Diagnosis not present

## 2022-05-01 DIAGNOSIS — N184 Chronic kidney disease, stage 4 (severe): Secondary | ICD-10-CM | POA: Diagnosis not present

## 2022-05-01 DIAGNOSIS — I130 Hypertensive heart and chronic kidney disease with heart failure and stage 1 through stage 4 chronic kidney disease, or unspecified chronic kidney disease: Secondary | ICD-10-CM | POA: Diagnosis not present

## 2022-05-01 DIAGNOSIS — J449 Chronic obstructive pulmonary disease, unspecified: Secondary | ICD-10-CM | POA: Diagnosis not present

## 2022-05-01 DIAGNOSIS — D631 Anemia in chronic kidney disease: Secondary | ICD-10-CM | POA: Diagnosis not present

## 2022-05-01 NOTE — Telephone Encounter (Signed)
Abigail Wallace OT calls nurse line to report abnormal findings at home visit today.   She reports patients blood pressure was 168/94. She reports she is asymptomatic. Denies any SOB, chest pains, vision changes or headaches.   Patient does endorse back pain and contributes this to her elevated BP.   Will forward to PCP.

## 2022-05-01 NOTE — Telephone Encounter (Signed)
Son calls nurse line requesting a supportive letter for the CAPs Program.   He reports he is needing a letter from PCP stating the patient can not be left alone for an extended period of time.   Will forward to PCP.

## 2022-05-02 ENCOUNTER — Encounter: Payer: Self-pay | Admitting: Family Medicine

## 2022-05-02 DIAGNOSIS — I130 Hypertensive heart and chronic kidney disease with heart failure and stage 1 through stage 4 chronic kidney disease, or unspecified chronic kidney disease: Secondary | ICD-10-CM | POA: Diagnosis not present

## 2022-05-02 DIAGNOSIS — I5043 Acute on chronic combined systolic (congestive) and diastolic (congestive) heart failure: Secondary | ICD-10-CM | POA: Diagnosis not present

## 2022-05-02 DIAGNOSIS — E1122 Type 2 diabetes mellitus with diabetic chronic kidney disease: Secondary | ICD-10-CM | POA: Diagnosis not present

## 2022-05-02 DIAGNOSIS — J449 Chronic obstructive pulmonary disease, unspecified: Secondary | ICD-10-CM | POA: Diagnosis not present

## 2022-05-02 DIAGNOSIS — D631 Anemia in chronic kidney disease: Secondary | ICD-10-CM | POA: Diagnosis not present

## 2022-05-02 DIAGNOSIS — N184 Chronic kidney disease, stage 4 (severe): Secondary | ICD-10-CM | POA: Diagnosis not present

## 2022-05-02 NOTE — Telephone Encounter (Signed)
Called, increase metoprolol 50 mg BID. Has pacemaker. Discussed CT findings. All questions answered.  Abigail Starr, MD  Family Medicine Teaching Service

## 2022-05-02 NOTE — Telephone Encounter (Signed)
Letter in letters tab. Please print for patient Abigail Starr, MD  [facility] Medicine Teaching Service

## 2022-05-02 NOTE — Telephone Encounter (Signed)
Messaged son for additional readings. Will follow up Terisa Starr, MD  [facility] Medicine Teaching Service

## 2022-05-02 NOTE — Telephone Encounter (Signed)
Called Abigail Wallace. He asks that letter be placed in mail. Placed in outgoing mail file.   Veronda Prude, RN

## 2022-05-02 NOTE — Telephone Encounter (Signed)
Abigail Wallace returns call to nurse line regarding elevated Bp readings.   He states that most recent BP is 170/89, HR: 60. She has been taking all of her BP medications as directed. BP last night was 166/60.   She is asymptomatic at this time.   Abigail Wallace is requesting returned call from provider to discuss this further. He says that he also has follow up questions regarding results of CT scan last week.   Will forward to PCP.   Abigail Wallace

## 2022-05-03 DIAGNOSIS — E1122 Type 2 diabetes mellitus with diabetic chronic kidney disease: Secondary | ICD-10-CM | POA: Diagnosis not present

## 2022-05-03 DIAGNOSIS — I5043 Acute on chronic combined systolic (congestive) and diastolic (congestive) heart failure: Secondary | ICD-10-CM | POA: Diagnosis not present

## 2022-05-03 DIAGNOSIS — J449 Chronic obstructive pulmonary disease, unspecified: Secondary | ICD-10-CM | POA: Diagnosis not present

## 2022-05-03 DIAGNOSIS — D631 Anemia in chronic kidney disease: Secondary | ICD-10-CM | POA: Diagnosis not present

## 2022-05-03 DIAGNOSIS — N184 Chronic kidney disease, stage 4 (severe): Secondary | ICD-10-CM | POA: Diagnosis not present

## 2022-05-03 DIAGNOSIS — I130 Hypertensive heart and chronic kidney disease with heart failure and stage 1 through stage 4 chronic kidney disease, or unspecified chronic kidney disease: Secondary | ICD-10-CM | POA: Diagnosis not present

## 2022-05-03 NOTE — Telephone Encounter (Signed)
Noted and agree. 

## 2022-05-03 NOTE — Telephone Encounter (Signed)
[name] calls nurse line to discuss.   He reports she has an apt tomorrow morning with [facility] provider. He reports he will let us know how the visit goes.   I gave information for walk in clinic in the meantime. He reports she can not get into the car. I advised 911 is always an option in the event they are needed.   [name] to call if symptoms worsen in the meantime.

## 2022-05-04 DIAGNOSIS — J811 Chronic pulmonary edema: Secondary | ICD-10-CM | POA: Diagnosis not present

## 2022-05-08 DIAGNOSIS — N184 Chronic kidney disease, stage 4 (severe): Secondary | ICD-10-CM | POA: Diagnosis not present

## 2022-05-08 DIAGNOSIS — J449 Chronic obstructive pulmonary disease, unspecified: Secondary | ICD-10-CM | POA: Diagnosis not present

## 2022-05-08 DIAGNOSIS — I130 Hypertensive heart and chronic kidney disease with heart failure and stage 1 through stage 4 chronic kidney disease, or unspecified chronic kidney disease: Secondary | ICD-10-CM | POA: Diagnosis not present

## 2022-05-08 DIAGNOSIS — D631 Anemia in chronic kidney disease: Secondary | ICD-10-CM | POA: Diagnosis not present

## 2022-05-08 DIAGNOSIS — I5043 Acute on chronic combined systolic (congestive) and diastolic (congestive) heart failure: Secondary | ICD-10-CM | POA: Diagnosis not present

## 2022-05-08 DIAGNOSIS — E1122 Type 2 diabetes mellitus with diabetic chronic kidney disease: Secondary | ICD-10-CM | POA: Diagnosis not present

## 2022-05-09 ENCOUNTER — Telehealth: Payer: Self-pay

## 2022-05-09 DIAGNOSIS — I130 Hypertensive heart and chronic kidney disease with heart failure and stage 1 through stage 4 chronic kidney disease, or unspecified chronic kidney disease: Secondary | ICD-10-CM | POA: Diagnosis not present

## 2022-05-09 DIAGNOSIS — G8929 Other chronic pain: Secondary | ICD-10-CM

## 2022-05-09 DIAGNOSIS — I5043 Acute on chronic combined systolic (congestive) and diastolic (congestive) heart failure: Secondary | ICD-10-CM | POA: Diagnosis not present

## 2022-05-09 DIAGNOSIS — E1122 Type 2 diabetes mellitus with diabetic chronic kidney disease: Secondary | ICD-10-CM | POA: Diagnosis not present

## 2022-05-09 DIAGNOSIS — D631 Anemia in chronic kidney disease: Secondary | ICD-10-CM | POA: Diagnosis not present

## 2022-05-09 DIAGNOSIS — M1711 Unilateral primary osteoarthritis, right knee: Secondary | ICD-10-CM

## 2022-05-09 DIAGNOSIS — I5042 Chronic combined systolic (congestive) and diastolic (congestive) heart failure: Secondary | ICD-10-CM

## 2022-05-09 DIAGNOSIS — J449 Chronic obstructive pulmonary disease, unspecified: Secondary | ICD-10-CM | POA: Diagnosis not present

## 2022-05-09 DIAGNOSIS — N184 Chronic kidney disease, stage 4 (severe): Secondary | ICD-10-CM | POA: Diagnosis not present

## 2022-05-09 NOTE — Telephone Encounter (Signed)
Community message sent to [facility]. Will await response.   Johnaton Sonneborn C Nathian Stencil, RN  

## 2022-05-09 NOTE — Telephone Encounter (Signed)
Noted. Will continue to monitor.  Terisa Starr, MD  Family Medicine Teaching Service

## 2022-05-09 NOTE — Telephone Encounter (Signed)
[name] calls nurse line requesting a DME order.   He reports her home health disciplines think she will benefit from an "up right bariatric walker."   Will forward to PCP .

## 2022-05-09 NOTE — Telephone Encounter (Signed)
Order palced.   Terisa Starr, MD  Family Medicine Teaching Service

## 2022-05-09 NOTE — Telephone Encounter (Signed)
Received the following message from [employer].   Pt received a heavy duty wheelchair 07/22/2017 so patients insurance will not cover another ambulatory aid till  07/2023.  thank you!  Will send mychart message, as I have not been able to reach son by phone this AM.   Veronda Prude, RN

## 2022-05-09 NOTE — Telephone Encounter (Signed)
[name] LVM on nurse line this AM to report fasting blood sugar level of 91.  He wanted to make provider aware that this is lower than her normal fasting level.   Attempted to return call to [name]. He did not answer, LVM asking him to return call to office to discuss further.   Veronda Prude, RN

## 2022-05-10 ENCOUNTER — Encounter: Payer: Self-pay | Admitting: Family Medicine

## 2022-05-15 DIAGNOSIS — D631 Anemia in chronic kidney disease: Secondary | ICD-10-CM | POA: Diagnosis not present

## 2022-05-15 DIAGNOSIS — N184 Chronic kidney disease, stage 4 (severe): Secondary | ICD-10-CM | POA: Diagnosis not present

## 2022-05-15 DIAGNOSIS — I130 Hypertensive heart and chronic kidney disease with heart failure and stage 1 through stage 4 chronic kidney disease, or unspecified chronic kidney disease: Secondary | ICD-10-CM | POA: Diagnosis not present

## 2022-05-15 DIAGNOSIS — E1122 Type 2 diabetes mellitus with diabetic chronic kidney disease: Secondary | ICD-10-CM | POA: Diagnosis not present

## 2022-05-15 DIAGNOSIS — J449 Chronic obstructive pulmonary disease, unspecified: Secondary | ICD-10-CM | POA: Diagnosis not present

## 2022-05-15 DIAGNOSIS — I5043 Acute on chronic combined systolic (congestive) and diastolic (congestive) heart failure: Secondary | ICD-10-CM | POA: Diagnosis not present

## 2022-05-16 ENCOUNTER — Other Ambulatory Visit: Payer: 59

## 2022-05-16 DIAGNOSIS — J449 Chronic obstructive pulmonary disease, unspecified: Secondary | ICD-10-CM | POA: Diagnosis not present

## 2022-05-16 DIAGNOSIS — N184 Chronic kidney disease, stage 4 (severe): Secondary | ICD-10-CM | POA: Diagnosis not present

## 2022-05-16 DIAGNOSIS — D631 Anemia in chronic kidney disease: Secondary | ICD-10-CM | POA: Diagnosis not present

## 2022-05-16 DIAGNOSIS — I130 Hypertensive heart and chronic kidney disease with heart failure and stage 1 through stage 4 chronic kidney disease, or unspecified chronic kidney disease: Secondary | ICD-10-CM | POA: Diagnosis not present

## 2022-05-16 DIAGNOSIS — I5043 Acute on chronic combined systolic (congestive) and diastolic (congestive) heart failure: Secondary | ICD-10-CM | POA: Diagnosis not present

## 2022-05-16 DIAGNOSIS — E1122 Type 2 diabetes mellitus with diabetic chronic kidney disease: Secondary | ICD-10-CM | POA: Diagnosis not present

## 2022-05-17 ENCOUNTER — Telehealth: Payer: Self-pay | Admitting: Family Medicine

## 2022-05-17 DIAGNOSIS — M25532 Pain in left wrist: Secondary | ICD-10-CM

## 2022-05-17 NOTE — Telephone Encounter (Signed)
[name] has requested extension of services through son, [name].    Nursing-can you please call and find out if we can give verbal for this or if they need new orders?  Thanks! CB

## 2022-05-17 NOTE — Telephone Encounter (Signed)
Community message sent to [place] at [facility] to check and see what is needed to extend orders.   Will await response.   Veronda Prude, RN

## 2022-05-18 ENCOUNTER — Other Ambulatory Visit: Payer: Self-pay | Admitting: Cardiovascular Disease

## 2022-05-18 ENCOUNTER — Other Ambulatory Visit: Payer: Self-pay | Admitting: Family Medicine

## 2022-05-18 DIAGNOSIS — E1165 Type 2 diabetes mellitus with hyperglycemia: Secondary | ICD-10-CM

## 2022-05-18 DIAGNOSIS — D631 Anemia in chronic kidney disease: Secondary | ICD-10-CM | POA: Diagnosis not present

## 2022-05-18 DIAGNOSIS — E1122 Type 2 diabetes mellitus with diabetic chronic kidney disease: Secondary | ICD-10-CM | POA: Diagnosis not present

## 2022-05-18 DIAGNOSIS — I5043 Acute on chronic combined systolic (congestive) and diastolic (congestive) heart failure: Secondary | ICD-10-CM | POA: Diagnosis not present

## 2022-05-18 DIAGNOSIS — N184 Chronic kidney disease, stage 4 (severe): Secondary | ICD-10-CM | POA: Diagnosis not present

## 2022-05-18 DIAGNOSIS — I130 Hypertensive heart and chronic kidney disease with heart failure and stage 1 through stage 4 chronic kidney disease, or unspecified chronic kidney disease: Secondary | ICD-10-CM | POA: Diagnosis not present

## 2022-05-18 DIAGNOSIS — J449 Chronic obstructive pulmonary disease, unspecified: Secondary | ICD-10-CM | POA: Diagnosis not present

## 2022-05-18 NOTE — Addendum Note (Signed)
Addended by: Manson Passey, Amma Crear on: 05/18/2022 04:25 PM   Modules accepted: Orders

## 2022-05-18 NOTE — Telephone Encounter (Signed)
Received returned message from [facility] regarding orders. She just needed our approval for continuance of skilled nursing and PT. Provided with approval via community message per protocol.   Veronda Prude, RN

## 2022-05-18 NOTE — Telephone Encounter (Signed)
Patient's son returns call to nurse line regarding concern for left sided swelling. He reports that she had "an incident" getting out of bed on Wednesday, in which she twisted her left wrist. She does complain on pain on this side.   Advised son that she would need appointment for further evaluation. He reports that he is unable to bring her in for an appointment today.   [name] reports that she is unable to tolerate icing the area. Provided with supportive measures for now.   Veronda Prude, RN

## 2022-05-18 NOTE — Telephone Encounter (Signed)
Called son. Discussed need to be seen. Son and patient prefer to avoid this. Discussed risks. Will obtain X-ray next week of wrist. Discussed reasons to call and return to care.  She is doing well otherwise, has had very good.  Weight 254 pounds 14 day Glucose 109 All questions answered.  Terisa Starr, MD  Family Medicine Teaching Service

## 2022-05-18 NOTE — Telephone Encounter (Signed)
Attempted to call son. Left generic voicemail to call back

## 2022-05-19 ENCOUNTER — Encounter: Payer: Self-pay | Admitting: Family Medicine

## 2022-05-21 ENCOUNTER — Ambulatory Visit: Payer: 59 | Admitting: Neurology

## 2022-05-23 ENCOUNTER — Encounter: Payer: Self-pay | Admitting: Family Medicine

## 2022-05-23 DIAGNOSIS — I10 Essential (primary) hypertension: Secondary | ICD-10-CM

## 2022-05-23 DIAGNOSIS — E1122 Type 2 diabetes mellitus with diabetic chronic kidney disease: Secondary | ICD-10-CM | POA: Diagnosis not present

## 2022-05-23 DIAGNOSIS — I5043 Acute on chronic combined systolic (congestive) and diastolic (congestive) heart failure: Secondary | ICD-10-CM | POA: Diagnosis not present

## 2022-05-23 DIAGNOSIS — E1165 Type 2 diabetes mellitus with hyperglycemia: Secondary | ICD-10-CM

## 2022-05-23 DIAGNOSIS — J449 Chronic obstructive pulmonary disease, unspecified: Secondary | ICD-10-CM | POA: Diagnosis not present

## 2022-05-23 DIAGNOSIS — D631 Anemia in chronic kidney disease: Secondary | ICD-10-CM | POA: Diagnosis not present

## 2022-05-23 DIAGNOSIS — N184 Chronic kidney disease, stage 4 (severe): Secondary | ICD-10-CM | POA: Diagnosis not present

## 2022-05-23 DIAGNOSIS — I130 Hypertensive heart and chronic kidney disease with heart failure and stage 1 through stage 4 chronic kidney disease, or unspecified chronic kidney disease: Secondary | ICD-10-CM | POA: Diagnosis not present

## 2022-05-23 NOTE — Telephone Encounter (Signed)
Noted and agree.  Jude Linck, MD  Family Medicine Teaching Service   

## 2022-05-23 NOTE — Telephone Encounter (Signed)
[name] Geologist, engineering.   He reports the blisters have popped. He denies any signs of infection. No drainage, swelling or redness.   He reports the swelling in her wrists have been stable over the last 24 hours. He reports her ankles have increased by .5cm in the last 24 hours. Denies any SOB.  Vitals are O2 sats are 96%. HR 60s.  He reports she has an apt with Cardiology on Friday and Urology tomorrow.   [name] advised to continue to monitor and send mychart updates.

## 2022-05-24 DIAGNOSIS — R338 Other retention of urine: Secondary | ICD-10-CM | POA: Diagnosis not present

## 2022-05-25 ENCOUNTER — Ambulatory Visit (HOSPITAL_COMMUNITY)
Admission: RE | Admit: 2022-05-25 | Discharge: 2022-05-25 | Disposition: A | Discharge status: Home / Self Care | Payer: 59 | Source: Ambulatory Visit | Attending: Cardiovascular Disease | Admitting: Cardiovascular Disease

## 2022-05-25 ENCOUNTER — Telehealth: Payer: Self-pay

## 2022-05-25 ENCOUNTER — Telehealth: Payer: Self-pay | Admitting: Family Medicine

## 2022-05-25 DIAGNOSIS — I714 Abdominal aortic aneurysm, without rupture, unspecified: Secondary | ICD-10-CM | POA: Diagnosis not present

## 2022-05-25 DIAGNOSIS — I7143 Infrarenal abdominal aortic aneurysm, without rupture: Secondary | ICD-10-CM | POA: Diagnosis not present

## 2022-05-25 MED ORDER — METOPROLOL TARTRATE 50 MG PO TABS: 100.0000 mg | ORAL_TABLET | Freq: Two times a day (BID) | ORAL | 3 refills | Status: DC

## 2022-05-25 MED ORDER — FUROSEMIDE 40 MG PO TABS: 40.0000 mg | ORAL_TABLET | ORAL | 3 refills | Status: DC

## 2022-05-25 NOTE — Telephone Encounter (Signed)
Provided verbal orders to Abigail Wallace, PT clinical manager for BMP next week.   They will fax results to our office.   Abigail Prude, RN

## 2022-05-25 NOTE — Telephone Encounter (Signed)
Please call [facility] and give verbal order for BMP next week. Please have them fax to our office. Terisa Starr, MD  Family Medicine Teaching Service

## 2022-05-25 NOTE — Addendum Note (Signed)
Addended by: Manson Passey, Aarianna Hoadley on: 05/25/2022 11:58 AM   Modules accepted: Orders

## 2022-05-25 NOTE — Telephone Encounter (Signed)
Tried to reach patient. Unable to leave voicemail. The phone rung for a while then stated to try my call again later

## 2022-05-30 ENCOUNTER — Other Ambulatory Visit: Payer: Self-pay | Admitting: Family Medicine

## 2022-05-30 DIAGNOSIS — Z780 Asymptomatic menopausal state: Secondary | ICD-10-CM

## 2022-05-30 DIAGNOSIS — E2839 Other primary ovarian failure: Secondary | ICD-10-CM

## 2022-05-31 ENCOUNTER — Encounter: Payer: Self-pay | Admitting: Family Medicine

## 2022-05-31 ENCOUNTER — Telehealth: Payer: Self-pay

## 2022-05-31 DIAGNOSIS — J449 Chronic obstructive pulmonary disease, unspecified: Secondary | ICD-10-CM | POA: Diagnosis not present

## 2022-05-31 DIAGNOSIS — E1122 Type 2 diabetes mellitus with diabetic chronic kidney disease: Secondary | ICD-10-CM | POA: Diagnosis not present

## 2022-05-31 DIAGNOSIS — D631 Anemia in chronic kidney disease: Secondary | ICD-10-CM | POA: Diagnosis not present

## 2022-05-31 DIAGNOSIS — I130 Hypertensive heart and chronic kidney disease with heart failure and stage 1 through stage 4 chronic kidney disease, or unspecified chronic kidney disease: Secondary | ICD-10-CM | POA: Diagnosis not present

## 2022-05-31 DIAGNOSIS — N184 Chronic kidney disease, stage 4 (severe): Secondary | ICD-10-CM | POA: Diagnosis not present

## 2022-05-31 DIAGNOSIS — I5043 Acute on chronic combined systolic (congestive) and diastolic (congestive) heart failure: Secondary | ICD-10-CM | POA: Diagnosis not present

## 2022-05-31 NOTE — Addendum Note (Signed)
Addended by: Veronda Prude on: 05/31/2022 02:42 PM   Modules accepted: Orders

## 2022-05-31 NOTE — Telephone Encounter (Signed)
Vikki Ports [facility] RN calls nurse line requesting a verbal order.  She reports a failed attempt at venipuncture stick for BMET.   She reports they need another order to go back out there and try again.   Verbal order given.

## 2022-05-31 NOTE — Telephone Encounter (Signed)
Monique from [employer] calling for PT verbal orders as follows:  1 time(s) weekly for 5 week(s)  Verbal orders given per [facility] protocol  Veronda Prude, RN

## 2022-06-01 ENCOUNTER — Telehealth: Payer: Self-pay | Admitting: Family Medicine

## 2022-06-01 ENCOUNTER — Emergency Department (HOSPITAL_COMMUNITY)
Admission: EM | Admit: 2022-06-01 | Discharge: 2022-06-01 | Disposition: A | Discharge status: Home / Self Care | Payer: 59 | Attending: Emergency Medicine | Admitting: Emergency Medicine

## 2022-06-01 ENCOUNTER — Emergency Department (HOSPITAL_BASED_OUTPATIENT_CLINIC_OR_DEPARTMENT_OTHER): Payer: 59

## 2022-06-01 ENCOUNTER — Other Ambulatory Visit: Payer: Self-pay

## 2022-06-01 ENCOUNTER — Other Ambulatory Visit: Payer: Self-pay | Admitting: Family Medicine

## 2022-06-01 ENCOUNTER — Encounter (HOSPITAL_COMMUNITY): Payer: Self-pay

## 2022-06-01 ENCOUNTER — Other Ambulatory Visit: Payer: 59

## 2022-06-01 DIAGNOSIS — Z91040 Latex allergy status: Secondary | ICD-10-CM | POA: Insufficient documentation

## 2022-06-01 DIAGNOSIS — R600 Localized edema: Secondary | ICD-10-CM | POA: Insufficient documentation

## 2022-06-01 DIAGNOSIS — R6889 Other general symptoms and signs: Secondary | ICD-10-CM | POA: Diagnosis not present

## 2022-06-01 DIAGNOSIS — Z743 Need for continuous supervision: Secondary | ICD-10-CM | POA: Diagnosis not present

## 2022-06-01 DIAGNOSIS — R609 Edema, unspecified: Secondary | ICD-10-CM | POA: Diagnosis not present

## 2022-06-01 DIAGNOSIS — Z7901 Long term (current) use of anticoagulants: Secondary | ICD-10-CM | POA: Insufficient documentation

## 2022-06-01 DIAGNOSIS — J449 Chronic obstructive pulmonary disease, unspecified: Secondary | ICD-10-CM | POA: Insufficient documentation

## 2022-06-01 DIAGNOSIS — N189 Chronic kidney disease, unspecified: Secondary | ICD-10-CM | POA: Insufficient documentation

## 2022-06-01 DIAGNOSIS — M7989 Other specified soft tissue disorders: Secondary | ICD-10-CM | POA: Diagnosis not present

## 2022-06-01 DIAGNOSIS — M25432 Effusion, left wrist: Secondary | ICD-10-CM | POA: Insufficient documentation

## 2022-06-01 DIAGNOSIS — Z950 Presence of cardiac pacemaker: Secondary | ICD-10-CM | POA: Diagnosis not present

## 2022-06-01 DIAGNOSIS — Z7401 Bed confinement status: Secondary | ICD-10-CM | POA: Diagnosis not present

## 2022-06-01 DIAGNOSIS — I509 Heart failure, unspecified: Secondary | ICD-10-CM | POA: Diagnosis not present

## 2022-06-01 DIAGNOSIS — R531 Weakness: Secondary | ICD-10-CM | POA: Diagnosis not present

## 2022-06-01 DIAGNOSIS — E1165 Type 2 diabetes mellitus with hyperglycemia: Secondary | ICD-10-CM

## 2022-06-01 LAB — CBC WITH DIFFERENTIAL/PLATELET
Abs Immature Granulocytes: 0.26 10*3/uL — ABNORMAL HIGH (ref 0.00–0.07)
Basophils Absolute: 0.1 10*3/uL (ref 0.0–0.1)
Basophils Relative: 1 %
Eosinophils Absolute: 0.4 10*3/uL (ref 0.0–0.5)
Eosinophils Relative: 3 %
HCT: 34.6 % — ABNORMAL LOW (ref 36.0–46.0)
Hemoglobin: 10.9 g/dL — ABNORMAL LOW (ref 12.0–15.0)
Immature Granulocytes: 2 %
Lymphocytes Relative: 30 %
Lymphs Abs: 3.7 10*3/uL (ref 0.7–4.0)
MCH: 27 pg (ref 26.0–34.0)
MCHC: 31.5 g/dL (ref 30.0–36.0)
MCV: 85.9 fL (ref 80.0–100.0)
Monocytes Absolute: 0.7 10*3/uL (ref 0.1–1.0)
Monocytes Relative: 6 %
Neutro Abs: 7.1 10*3/uL (ref 1.7–7.7)
Neutrophils Relative %: 58 %
Platelets: 227 10*3/uL (ref 150–400)
RBC: 4.03 MIL/uL (ref 3.87–5.11)
RDW: 16.2 % — ABNORMAL HIGH (ref 11.5–15.5)
WBC: 12.3 10*3/uL — ABNORMAL HIGH (ref 4.0–10.5)
nRBC: 0 % (ref 0.0–0.2)

## 2022-06-01 LAB — COMPREHENSIVE METABOLIC PANEL
ALT: 9 U/L (ref 0–44)
AST: 14 U/L — ABNORMAL LOW (ref 15–41)
Albumin: 3.8 g/dL (ref 3.5–5.0)
Alkaline Phosphatase: 46 U/L (ref 38–126)
Anion gap: 9 (ref 5–15)
BUN: 39 mg/dL — ABNORMAL HIGH (ref 8–23)
CO2: 20 mmol/L — ABNORMAL LOW (ref 22–32)
Calcium: 9.7 mg/dL (ref 8.9–10.3)
Chloride: 103 mmol/L (ref 98–111)
Creatinine, Ser: 1.66 mg/dL — ABNORMAL HIGH (ref 0.44–1.00)
GFR, Estimated: 31 mL/min — ABNORMAL LOW (ref >60)
Glucose, Bld: 108 mg/dL — ABNORMAL HIGH (ref 70–99)
Potassium: 4.4 mmol/L (ref 3.5–5.1)
Sodium: 132 mmol/L — ABNORMAL LOW (ref 135–145)
Total Bilirubin: 0.6 mg/dL (ref 0.3–1.2)
Total Protein: 7.4 g/dL (ref 6.5–8.1)

## 2022-06-01 LAB — BRAIN NATRIURETIC PEPTIDE: B Natriuretic Peptide: 543.2 pg/mL — ABNORMAL HIGH (ref 0.0–100.0)

## 2022-06-01 MED ORDER — ONETOUCH DELICA LANCETS 33G MISC: 3 refills | Status: DC

## 2022-06-01 MED ORDER — LANCET DEVICE MISC
1.0000 | 11 refills | Status: DC
Start: 2022-06-01 — End: 2023-06-15

## 2022-06-01 MED ORDER — ACETAMINOPHEN 500 MG PO TABS
500.0000 mg | ORAL_TABLET | Freq: Once | ORAL | Status: AC
  Administered 2022-06-01: 500 mg via ORAL
  Filled 2022-06-01: qty 1

## 2022-06-01 MED ORDER — ONETOUCH VERIO VI STRP
ORAL_STRIP | 12 refills | Status: DC
Start: 2022-06-01 — End: 2022-11-15

## 2022-06-01 MED ORDER — ONETOUCH VERIO VI STRP: ORAL_STRIP | 12 refills | Status: DC

## 2022-06-01 NOTE — Telephone Encounter (Signed)
Called and spoke with patients son [name]. He notes his mothers left hand has continued to swell, with the diameter increased 3.5 cm in the past 24 hours. He notes its not red or warm, not painful but it is distressing her. We discussed potential of possible swelling being related to fluid accumulaiton but also since its asymmetric possibly clot. Offered [facility] visit and discussed this versus ED where they could do [place] to rule out clot, and [name] is in agreement to take her to the ED. He notes she is not having shortness of breath or fevers. Low suspicion for infection.  Agree with ED evaluation, [place] r/o DVT. Check BMP - make sure not worsening kidney function. If clot ruled out can consider increasing home lasix for several days to help with fluid accumulation.  Burley Saver MD

## 2022-06-01 NOTE — Progress Notes (Signed)
BLE and LUE venous duplex exams have been completed.  Preliminary results given to Dr. Rush Landmark.   Results can be found under chart review under CV PROC. 06/01/2022 3:04 PM Ryaan Vanwagoner RVT, RDMS

## 2022-06-01 NOTE — ED Triage Notes (Signed)
Pt BIBA from home c/o swelling bilateral, left wrist and lower leg has greater swelling. Pt is on lasix and has a foley cath.   BP 142/78 HR 70 RR 18 96 % RA

## 2022-06-01 NOTE — Progress Notes (Signed)
Refilled lancets and test strips per son's rpeference.

## 2022-06-01 NOTE — Telephone Encounter (Signed)
Received a call from son Abigail Wallace who notes Ms Abigail Wallace is being discharged from ED, with stable Cr and BNP elevated but slightly lower than previous. Bilateral LE and upper left extremity negative for DVT.  She has been taking lasix 40mg  every other day, discsused will take daily and check in on Monday about swelling. Recommend have HH RN check BMP next week, already ordered by Dr Manson Passey.   Will check in on Monday, consider pending repeat BMP may need to increase Lasix more to BID.   Burley Saver MD

## 2022-06-01 NOTE — ED Notes (Signed)
[facility] contacted for patient transport 

## 2022-06-01 NOTE — Telephone Encounter (Signed)
See phone note, called and discussed

## 2022-06-01 NOTE — ED Provider Notes (Signed)
[facility] AT [facility] Provider Note   CSN: [id number] Arrival date & time: 06/01/22  1015     History  Chief Complaint  Patient presents with   generalized edema    Abigail Wallace is a 77 y.o. female.  The history is provided by the patient, a relative and medical records. No language interpreter was used.  Illness Location:  Swollen left wrist and intermittent bilaterally legs. Severity:  Moderate Onset quality:  Gradual Duration:  1 week Timing:  Constant Progression:  Waxing and waning Chronicity:  Recurrent Associated symptoms: no abdominal pain, no chest pain, no congestion, no cough, no diarrhea, no fatigue, no fever, no nausea, no rash, no shortness of breath, no vomiting and no wheezing        Home Medications Prior to Admission medications   Medication Sig Start Date End Date Taking? Authorizing Provider  acetaminophen (TYLENOL) 500 MG tablet Take 1,000 mg by mouth every 6 (six) hours as needed for mild pain or headache.    [provider]  albuterol (VENTOLIN HFA) 108 (90 Base) MCG/ACT inhaler Inhale 2 puffs into the lungs every 6 (six) hours as needed for wheezing or shortness of breath. 07/12/21   Westley Chandler, MD  allopurinol (ZYLOPRIM) 100 MG tablet Take 1 tablet (100 mg total) by mouth daily. 04/16/22   Westley Chandler, MD  amLODipine (NORVASC) 10 MG tablet Take 1 tablet (10 mg total) by mouth daily. 11/13/21   Westley Chandler, MD  amoxicillin-clavulanate (AUGMENTIN) 500-125 MG tablet Take 1 tablet by mouth in the morning and at bedtime. 04/30/22   Westley Chandler, MD  apixaban (ELIQUIS) 5 MG TABS tablet Take 1 tablet (5 mg total) by mouth 2 (two) times daily. 04/16/22 04/16/23  Westley Chandler, MD  Blood Glucose Monitoring Suppl (ONETOUCH VERIO) w/Device KIT Check blood sugar daily. E11.9 03/23/22   Carney Living, MD  cholecalciferol (VITAMIN D3) 25 MCG (1000 UNIT) tablet Take 1 tablet (1,000 Units total) by mouth  daily. 04/16/22   Westley Chandler, MD  diclofenac Sodium (VOLTAREN) 1 % GEL Apply 2 g topically 4 (four) times daily as needed (pain). 04/30/22   Westley Chandler, MD  divalproex (DEPAKOTE) 250 MG DR tablet Take 1 tablet (250 mg total) by mouth every 12 (twelve) hours. 03/05/22   Rhetta Mura, MD  doxycycline (VIBRA-TABS) 100 MG tablet Take 1 tablet (100 mg total) by mouth 2 (two) times daily. 04/30/22   Westley Chandler, MD  furosemide (LASIX) 40 MG tablet Take 1 tablet (40 mg total) by mouth every other day. 05/25/22 05/25/23  Westley Chandler, MD  glucose blood [facility] VERIO) test strip Please use to check blood sugar once daily. E11.9 06/01/22   Billey Co, MD  hydrocortisone cream 0.5 % Apply 1 application topically 2 (two) times daily. Apply WITH Nystatin under left breast 11/28/20   Westley Chandler, MD  Incontinence Supplies MISC 1 Units by Does not apply route as needed. 12/13/15   McKeag, Janine Ores, MD  ipratropium-albuterol (DUONEB) 0.5-2.5 (3) MG/3ML SOLN Take 3 mLs by nebulization every 6 (six) hours as needed. 04/30/22   Westley Chandler, MD  Lancet Device MISC 1 Device by Does not apply route 3 (three) times a week. 07/15/19   Westley Chandler, MD  metoprolol tartrate (LOPRESSOR) 50 MG tablet Take 2 tablets (100 mg total) by mouth 2 (two) times daily. 05/25/22   Westley Chandler, MD  mupirocin cream (BACTROBAN) 2 % Apply 1 application. topically 2 (two) times daily. Patient taking differently: Apply 1 application  topically 2 (two) times daily as needed (to affected areas- for irritation). 03/24/21   Westley Chandler, MD  nystatin (MYCOSTATIN/NYSTOP) powder Apply 1 Application topically 3 (three) times daily. Patient taking differently: Apply 1 Application topically 3 (three) times daily as needed (under the breasts and skin folds- for irritation). 07/03/21   Westley Chandler, MD  OLANZapine (ZYPREXA) 5 MG tablet Take 5 mg by mouth at bedtime.    [provider]  OneTouch Delica Lancets  33G MISC Use to test once daily.  DX code:E11.9 06/01/22   Billey Co, MD  OVER THE COUNTER MEDICATION Apply 1 application  topically See admin instructions. Caldesene Medicated Protecting Powder- Apply under the breasts and between the skin folds 2 times a day as needed for irritation    [provider]  pravastatin (PRAVACHOL) 40 MG tablet TAKE ONE TABLET BY MOUTH EVERY EVENING 05/18/22   Croitoru, Mihai, MD  senna (SENOKOT) 8.6 MG TABS tablet Take 1 tablet (8.6 mg total) by mouth daily as needed for mild constipation. 04/11/21   Westley Chandler, MD  sodium bicarbonate 650 MG tablet Take 1 tablet (650 mg total) by mouth 2 (two) times daily. 04/07/22   Westley Chandler, MD  TRADJENTA 5 MG TABS tablet TAKE ONE TABLET BY MOUTH DAILY 05/18/22   Westley Chandler, MD  triamcinolone cream (KENALOG) 0.1 % Apply 1 Application topically 2 (two) times daily. Apply to L ear 04/06/22   Westley Chandler, MD  umeclidinium-vilanterol [facility] ELLIPTA) 62.5-25 MCG/ACT AEPB Inhale 1 puff into the lungs daily. 03/20/22   Latrelle Dodrill, MD      Allergies    Aripiprazole, Clozapine, Haloperidol lactate, Benadryl [diphenhydramine hcl], Benztropine, Latuda [lurasidone hcl], Lorazepam, Remeron [mirtazapine], Keflex [cephalexin], Codeine, and Latex    Review of Systems   Review of Systems  Constitutional:  Negative for chills, fatigue and fever.  HENT:  Negative for congestion.   Respiratory:  Negative for cough, chest tightness, shortness of breath and wheezing.   Cardiovascular:  Positive for leg swelling. Negative for chest pain and palpitations.  Gastrointestinal:  Negative for abdominal pain, constipation, diarrhea, nausea and vomiting.  Genitourinary:  Negative for dysuria, flank pain and frequency.  Musculoskeletal:  Negative for back pain and neck stiffness.  Skin:  Negative for rash and wound.  Neurological:  Negative for seizures.  Psychiatric/Behavioral:  Negative for agitation and confusion.    All other systems reviewed and are negative.   Physical Exam Updated Vital Signs BP (!) 162/65 (BP Location: Left Arm)   Pulse 71   Temp 97.6 F (36.4 C) (Oral)   SpO2 94%  Physical Exam Vitals and nursing note reviewed.  Constitutional:      General: She is not in acute distress.    Appearance: She is well-developed. She is not ill-appearing, toxic-appearing or diaphoretic.  HENT:     Head: Normocephalic and atraumatic.     Nose: Nose normal.     Mouth/Throat:     Mouth: Mucous membranes are moist.     Pharynx: No oropharyngeal exudate or posterior oropharyngeal erythema.  Eyes:     Extraocular Movements: Extraocular movements intact.     Conjunctiva/sclera: Conjunctivae normal.     Pupils: Pupils are equal, round, and reactive to light.  Cardiovascular:     Rate and Rhythm: Normal rate and regular rhythm.  Heart sounds: No murmur heard. Pulmonary:     Effort: Pulmonary effort is normal. No respiratory distress.     Breath sounds: Normal breath sounds. No wheezing or rhonchi.  Chest:     Chest wall: No tenderness.  Abdominal:     Palpations: Abdomen is soft.     Tenderness: There is no abdominal tenderness.  Musculoskeletal:        General: Swelling present. No tenderness.     Cervical back: Neck supple. No tenderness.     Right lower leg: Edema present.     Left lower leg: Edema present.     Comments: Swollen left wrist.  Also mild swelling of both ankles.  Extremities nontender on my initial exam.  Intact pulses, strength and sensation.  Skin:    General: Skin is warm and dry.     Capillary Refill: Capillary refill takes less than 2 seconds.     Findings: No erythema or rash.  Neurological:     General: No focal deficit present.     Mental Status: She is alert.  Psychiatric:        Mood and Affect: Mood normal.     ED Results / Procedures / Treatments   Labs (all labs ordered are listed, but only abnormal results are displayed) Labs Reviewed  CBC WITH  DIFFERENTIAL/PLATELET - Abnormal; Notable for the following components:      Result Value   WBC 12.3 (*)    Hemoglobin 10.9 (*)    HCT 34.6 (*)    RDW 16.2 (*)    Abs Immature Granulocytes 0.26 (*)    All other components within normal limits  COMPREHENSIVE METABOLIC PANEL - Abnormal; Notable for the following components:   Sodium 132 (*)    CO2 20 (*)    Glucose, Bld 108 (*)    BUN 39 (*)    Creatinine, Ser 1.66 (*)    AST 14 (*)    GFR, Estimated 31 (*)    All other components within normal limits  BRAIN NATRIURETIC PEPTIDE - Abnormal; Notable for the following components:   B Natriuretic Peptide 543.2 (*)    All other components within normal limits    EKG EKG Interpretation  Date/Time:  Friday Jun 01 2022 13:21:01 EDT Ventricular Rate:  70 PR Interval:  352 QRS Duration: 183 QT Interval:  500 QTC Calculation: 540 R Axis:   -50 Text Interpretation: Ventricular-paced rhythm No further analysis attempted due to paced rhythm when compared to prior, similar appearance. NO STEMI Confirmed by Theda Belfast ([id number]) on 06/01/2022 1:47:29 PM  Radiology VAS [place] LOWER EXTREMITY VENOUS (DVT) (ONLY [facility] & [facility])  Result Date: 06/01/2022  Lower Venous DVT Study Patient Name:  [name]  Date of Exam:   06/01/2022 Medical Rec #: [id number]        Accession #:    [id number] Date of Birth: 24-Oct-1945        Patient Gender: F Patient Age:   54 years Exam Location:  [facility] Procedure:      VAS [place] LOWER EXTREMITY VENOUS (DVT) Referring Phys: Lynden Oxford --------------------------------------------------------------------------------  Indications: Swelling.  Limitations: VERY limited due to body habitus, poor ultrasound/tissue interface and poor patient positioning due to immobility and discomfort. Comparison Study: Previous exam on 03/15/21 was negative for DVT Performing Technologist: Ernestene Mention RVT, RDMS  Examination Guidelines: A complete evaluation includes B-mode imaging,  spectral Doppler, color Doppler, and power Doppler as needed of all accessible portions of each vessel.  Bilateral testing is considered an integral part of a complete examination. Limited examinations for reoccurring indications may be performed as noted. The reflux portion of the exam is performed with the patient in reverse Trendelenburg.  +---------+---------------+---------+-----------+----------+-------------------+ RIGHT    CompressibilityPhasicitySpontaneityPropertiesThrombus Aging      +---------+---------------+---------+-----------+----------+-------------------+ CFV      Full           Yes      Yes                                      +---------+---------------+---------+-----------+----------+-------------------+ SFJ      Full                                                             +---------+---------------+---------+-----------+----------+-------------------+ FV Prox  Full           Yes      Yes                                      +---------+---------------+---------+-----------+----------+-------------------+ FV Mid   Full           Yes      Yes                                      +---------+---------------+---------+-----------+----------+-------------------+ FV DistalFull           Yes      Yes                                      +---------+---------------+---------+-----------+----------+-------------------+ PFV                                                   Not well visualized +---------+---------------+---------+-----------+----------+-------------------+ POP      Full           Yes      Yes                                      +---------+---------------+---------+-----------+----------+-------------------+ PTV      Full                                                             +---------+---------------+---------+-----------+----------+-------------------+ PERO     Full                                                              +---------+---------------+---------+-----------+----------+-------------------+   +---------+---------------+---------+-----------+----------+--------------+  LEFT     CompressibilityPhasicitySpontaneityPropertiesThrombus Aging +---------+---------------+---------+-----------+----------+--------------+ CFV      Full           Yes      Yes                                 +---------+---------------+---------+-----------+----------+--------------+ SFJ      Full                                                        +---------+---------------+---------+-----------+----------+--------------+ FV Prox  Full           Yes      Yes                                 +---------+---------------+---------+-----------+----------+--------------+ FV Mid   Full           Yes      Yes                                 +---------+---------------+---------+-----------+----------+--------------+ FV DistalFull           Yes      Yes                                 +---------+---------------+---------+-----------+----------+--------------+ PFV      Full                                                        +---------+---------------+---------+-----------+----------+--------------+ POP      Full           Yes      Yes                                 +---------+---------------+---------+-----------+----------+--------------+ PTV      Full                                                        +---------+---------------+---------+-----------+----------+--------------+ PERO     Full                                                        +---------+---------------+---------+-----------+----------+--------------+    Summary: BILATERAL: - No evidence of deep vein thrombosis seen in the lower extremities, bilaterally. -   *See table(s) above for measurements and observations.    Preliminary     Procedures Procedures    Medications Ordered in ED Medications   acetaminophen (TYLENOL) tablet 500 mg (has no administration in time range)    ED Course/ Medical Decision  Making/ A&P                             Medical Decision Making Amount and/or Complexity of Data Reviewed Labs: ordered.  Risk OTC drugs.    Abigail Wallace is a 77 y.o. female with a past medical history significant for sick sinus syndrome status post pacemaker, A-fib on Eliquis, hyperlipidemia, CHF on alternating daily Lasix, COPD, CKD, and memory difficulties who presents at the direction of her PCP for further evaluation of left wrist swelling and intermittent bilateral lower extremity swelling.  According to patient and documentation from PCP phone note, for the last week or so patient has had worsening swelling in her left wrist.  She is also had some intermittent swelling in her legs bilaterally and she has continued take her Lasix as previously directed.  She denies any focal injuries or trauma.  Denies any fevers, chills, chest pain, shortness of breath, nausea, vomiting, constipation, diarrhea, or urinary changes.  She is a Foley catheter and is currently on Keflex for the UTI.  This is improving per family.  She has no history of blood clots and is on blood thinners for her A-fib.  She otherwise denies any new skin changes or rashes.  Family reports that they keep a close eye on her edema.  They have detailed measurements on her wrists and ankles daily.  PCP recommended getting ultrasound of the arm and labs to look for kidney function and BNP.  If workup reassuring they recommended increasing Lasix and close follow-up.  Exam, patient does have edema in left wrist compared to right.  There is some mild bilateral lower extremity edema that they report fluctuates.  Intact pulses in extremities.  Intact sensation and strength.  Lungs clear and chest nontender.  Abdomen nontender on my exam.  Will get a DVT ultrasound of the left wrist and both legs and will get labs including a  BNP.  If workup reassuring, dissipate discharge with instructions to increase Lasix and follow-up with PCP as previously recommended.  Workup returned showing no evidence of DVT in the left arm or bilateral legs.  BNP improved but still elevated.  Kidney function unchanged from baseline.  Went through all the findings with patient's family and patient and they agree with plan for discharge home and they will increase their Lasix as directed by the primary doctor.  They will call their doctor for close follow-up and understood return precautions.  She had some pain from sitting and would like some Tylenol.  As her liver function was not critically elevated will order Tylenol.  Patient will follow-up with her doctor and understood return precautions.  Patient discharged in good condition.         Final Clinical Impression(s) / ED Diagnoses Final diagnoses:  Peripheral edema    Rx / DC Orders ED Discharge Orders     None      Clinical Impression: 1. Peripheral edema     Disposition: Discharge  Condition: Good  I have discussed the results, Dx and Tx plan with the pt(& family if present). He/she/they expressed understanding and agree(s) with the plan. Discharge instructions discussed at great length. Strict return precautions discussed and pt &/or family have verbalized understanding of the instructions. No further questions at time of discharge.    New Prescriptions   No medications on file    Follow Up: Westley Chandler, MD [address] [place]  [place] [address] [phone]     [facility] Health Emergency Department at [facility] [address] [id number] mc [place] [address] [phone]       Elyshia Kumagai, Canary Brim, MD 06/01/22 1501

## 2022-06-01 NOTE — Discharge Instructions (Signed)
Your history, exam, workup today did not show evidence of blood clot in the arms or legs and your kidney function was unchanged from the last time it was checked.  Your BNP was slightly improved but I do still think there is peripheral edema.  Your phone and with the provider recommended increasing your Lasix so please take it every day instead of every other day and call them to discuss this diuresis plan.  The rest of your workup was similar to prior and we feel you are safe for discharge home.  Please rest and stay hydrated and follow-up with your doctor.  If any symptoms change or worsen acutely, please return to the nearest emergency department.

## 2022-06-03 DIAGNOSIS — J811 Chronic pulmonary edema: Secondary | ICD-10-CM | POA: Diagnosis not present

## 2022-06-04 ENCOUNTER — Encounter: Payer: Self-pay | Admitting: Family Medicine

## 2022-06-04 ENCOUNTER — Telehealth: Payer: Self-pay | Admitting: Licensed Clinical Social Worker

## 2022-06-04 ENCOUNTER — Telehealth: Payer: Self-pay | Admitting: Family Medicine

## 2022-06-04 NOTE — Telephone Encounter (Signed)
Attempted to call son regarding MyChart.  Will send message.   Nursing-- please call [name] and request BMP ASAP this week--please ensure they fax to our office ASAP as well. Should be drawn today or tomorrow if possible.  Terisa Starr, MD  Family Medicine Teaching Service

## 2022-06-04 NOTE — Telephone Encounter (Signed)
Called [name]. Unable to give verbal orders directly. Provided information to receive call back to provide verbal orders.   Veronda Prude, RN

## 2022-06-04 NOTE — Telephone Encounter (Signed)
Patient's son returns call to nurse line. Advised that provider has placed orders for labs and that she has also sent follow up message via mychart.   [name] was asking if provider was able to interpret [employer]. Discussed message left in mychart from Dr. Manson Passey.   [name] states that he will continue to obtain measurements, for his own peace of mind. He will reach out to PCP if there are any drastic changes.   He was also asking about options for scales that insurance may cover. Advised that insurance typically does not bill for these items and they would need to be purchased retail.   He feels that he would need a bed scale. I will send a message to [employer] to determine if this is something that insurance may cover.   Veronda Prude, RN

## 2022-06-04 NOTE — Telephone Encounter (Signed)
H&V Care Navigation CSW Progress Note  Clinical Social Worker contacted caregiver by phone to f/u on missed call while out of office. Reached pt son Ferne Reus [phone], DPR on file) this morning, he shares that pt ended up going to hospital last week for evaluation. She is still receiving services from [facility] but that sometimes staffing is an issue. He is still waiting on CAP services to start for them and is starting to feel financially tight covering additional sitter services outside of her Home Health. Otherwise shares he is doing the best he can- I encouraged him to reach out again if any additional services specifically interested in that we can research for him. No additional questions/concerns at this time.  Patient is participating in a Managed Medicaid Plan:  No, UHC Medicare and Medicaid  SDOH Screenings   Food Insecurity: No Food Insecurity (03/23/2022)  Housing: Low Risk  (03/23/2022)  Transportation Needs: No Transportation Needs (03/23/2022)  Utilities: Not At Risk (03/23/2022)  Alcohol Screen: Low Risk  (12/27/2020)  Depression (PHQ2-9): High Risk (02/22/2022)  Financial Resource Strain: Low Risk  (03/23/2022)  Physical Activity: Inactive (12/27/2020)  Social Connections: Socially Isolated (12/27/2020)  Stress: No Stress Concern Present (12/27/2020)  Tobacco Use: High Risk (06/01/2022)    Octavio Graves, MSW, LCSW Clinical Social Worker II [facility] Health Heart/Vascular Care Navigation  [phone]- work cell phone (preferred) [phone]- desk phone

## 2022-06-05 ENCOUNTER — Telehealth: Payer: Self-pay

## 2022-06-05 NOTE — Telephone Encounter (Signed)
Transition Care Management Follow-up Telephone Call Date of discharge and from where: 06/01/2022 [facility] How have you been since you were released from the hospital? Patient is feeling better Any questions or concerns? No  Items Reviewed: Did the pt receive and understand the discharge instructions provided? Yes  Medications obtained and verified? Yes  Other? No  Any new allergies since your discharge? No  Dietary orders reviewed? Yes Do you have support at home? Yes   Follow up appointments reviewed:  PCP Hospital f/u appt confirmed?  Patient had phone follow-up with PCP Terisa Starr MD 06/04/2022  Scheduled to see  on  @ . Specialist Hospital f/u appt confirmed? No  Scheduled to see  on  @ . Are transportation arrangements needed? No  If their condition worsens, is the pt aware to call PCP or go to the Emergency Dept.? Yes Was the patient provided with contact information for the PCP's office or ED? Yes Was to pt encouraged to call back with questions or concerns? Yes  Annye Forrey [facility]  [facility] Population Health Community Resource Care Guide   [email or web address]  ?? [phone]   Website: [email or web address]  [email or web address]

## 2022-06-05 NOTE — Telephone Encounter (Signed)
[name] calls nurse line reporting Abigail Wallace has not received verbal order to draw a BMP.  I called [name] and spoke with Darlene.   Verbal order for BMP given.

## 2022-06-06 ENCOUNTER — Encounter: Payer: Self-pay | Admitting: Family Medicine

## 2022-06-06 DIAGNOSIS — E1122 Type 2 diabetes mellitus with diabetic chronic kidney disease: Secondary | ICD-10-CM | POA: Diagnosis not present

## 2022-06-06 DIAGNOSIS — I5042 Chronic combined systolic (congestive) and diastolic (congestive) heart failure: Secondary | ICD-10-CM

## 2022-06-06 DIAGNOSIS — F03A0 Unspecified dementia, mild, without behavioral disturbance, psychotic disturbance, mood disturbance, and anxiety: Secondary | ICD-10-CM | POA: Diagnosis not present

## 2022-06-06 DIAGNOSIS — I5043 Acute on chronic combined systolic (congestive) and diastolic (congestive) heart failure: Secondary | ICD-10-CM | POA: Diagnosis not present

## 2022-06-06 DIAGNOSIS — N184 Chronic kidney disease, stage 4 (severe): Secondary | ICD-10-CM | POA: Diagnosis not present

## 2022-06-06 DIAGNOSIS — I130 Hypertensive heart and chronic kidney disease with heart failure and stage 1 through stage 4 chronic kidney disease, or unspecified chronic kidney disease: Secondary | ICD-10-CM | POA: Diagnosis not present

## 2022-06-06 DIAGNOSIS — D631 Anemia in chronic kidney disease: Secondary | ICD-10-CM | POA: Diagnosis not present

## 2022-06-06 NOTE — Telephone Encounter (Signed)
Manson Passey ok with Tresa Endo (preferred nurse for patient) to come out next week for blood work.   Tresa Endo is on vacation this week.   Gave verbal order to [facility].   Patient aware.

## 2022-06-06 NOTE — Telephone Encounter (Signed)
Yes please schedule here for labs--please call son and let him know  Terisa Starr, MD  [facility] Medicine Teaching Service

## 2022-06-06 NOTE — Telephone Encounter (Signed)
Abigail Wallace calls nurse line to report numerous failed attempts at blood work.  She reports a failed attempt on Friday and a failed attempt yesterday.   She would like PCP recommendations on going forward.

## 2022-06-07 ENCOUNTER — Other Ambulatory Visit: Payer: Self-pay | Admitting: Family Medicine

## 2022-06-07 DIAGNOSIS — K5904 Chronic idiopathic constipation: Secondary | ICD-10-CM

## 2022-06-07 MED ORDER — SENNA 8.6 MG PO TABS
1.0000 | ORAL_TABLET | Freq: Every day | ORAL | 0 refills | Status: DC | PRN
Start: 2022-06-07 — End: 2022-07-10

## 2022-06-08 ENCOUNTER — Telehealth: Payer: Self-pay

## 2022-06-08 NOTE — Telephone Encounter (Signed)
Faxed order to [facility].   Veronda Prude, RN

## 2022-06-08 NOTE — Telephone Encounter (Signed)
Spoke with Elease Hashimoto at [employer].   She reports she has contacted her supervisor for next steps. She reports the patient is coming up on her 5 year mark on 07/23/2022. She will let me know something soon.   I have updated [name].

## 2022-06-08 NOTE — Telephone Encounter (Signed)
[name] calls nurse line in regards to hospital bed.   We faxed DME order to [facility], however they are not in network with patients insurance.   He asks I fax to [employer]. I called this company to obtain fax number and spoke with a rep. Unfortunately, her insurance "will absolutely not" cover a new bed within 5 years at new DME company.  She reports since current bed is from [employer], [employer] would be responsible for repairs and/or replacing.   I sent [employer] a message for any help with situation. I advised the bed broke with her in it and they can not afford to pay out of pocket at this time.   Will await message.

## 2022-06-08 NOTE — Telephone Encounter (Signed)
Received notification from [employer] that they do not carry bed scales.   Provider has placed order for Semi electric bed with scale to [facility].   Order has been faxed to [facility]   Veronda Prude, RN

## 2022-06-12 DIAGNOSIS — F03A0 Unspecified dementia, mild, without behavioral disturbance, psychotic disturbance, mood disturbance, and anxiety: Secondary | ICD-10-CM | POA: Diagnosis not present

## 2022-06-12 DIAGNOSIS — I130 Hypertensive heart and chronic kidney disease with heart failure and stage 1 through stage 4 chronic kidney disease, or unspecified chronic kidney disease: Secondary | ICD-10-CM | POA: Diagnosis not present

## 2022-06-12 DIAGNOSIS — I5043 Acute on chronic combined systolic (congestive) and diastolic (congestive) heart failure: Secondary | ICD-10-CM | POA: Diagnosis not present

## 2022-06-12 DIAGNOSIS — E1122 Type 2 diabetes mellitus with diabetic chronic kidney disease: Secondary | ICD-10-CM | POA: Diagnosis not present

## 2022-06-12 DIAGNOSIS — N184 Chronic kidney disease, stage 4 (severe): Secondary | ICD-10-CM | POA: Diagnosis not present

## 2022-06-12 DIAGNOSIS — D631 Anemia in chronic kidney disease: Secondary | ICD-10-CM | POA: Diagnosis not present

## 2022-06-13 ENCOUNTER — Telehealth: Payer: Self-pay | Admitting: Student

## 2022-06-13 ENCOUNTER — Encounter: Payer: Self-pay | Admitting: Family Medicine

## 2022-06-13 DIAGNOSIS — Z1231 Encounter for screening mammogram for malignant neoplasm of breast: Secondary | ICD-10-CM

## 2022-06-13 NOTE — Telephone Encounter (Signed)
**  After Hours/ Emergency Line Call**  Received a call to report that [facility] son p/f left hand swelling starting this morning (over last couple of hours).  Endorsing was bathed by her aide and noticed swelling and slight pain. Denying dyspnea, normal pulse ox, normal heart rate.  Went to the ER for swelling in the left arm last week as well, did have lasix yesterday but is off of the lasix today. Emmylou has had this issue before and has been to the ED for DVT rule out. Ddx includes clot, infection, trauma fluid accumulation. Discussed that I cannot rule out DVT without seeing the patient, but reassured that the vitals son took are stable. Recommended that they would need to be seen by provider to rule out concerning differentials, advised for ED visit. Patient's son expressed difficulty with transportation as patient is completely immobile and needs EMS for transport. We discussed to watch for dyspnea, tachycardia, fever, chills, worsening swelling, redness, warmth. Would schedule appt, but again, son notes that is is difficult to come in as the EMS takes 48 hours notice for transport.  Red flags discussed.  Will forward to PCP.   Will have Dr. Manson Passey call and reassess 06/14/22. Instructed son to call me back in a couple of hours after elevation and not laying on the left side of her body for updated symptoms.   Alfredo Martinez, MD PGY-2, [facility] Family Medicine 06/13/2022 1:50 PM

## 2022-06-14 NOTE — Telephone Encounter (Signed)
Called son and patient. Edema is much improved. Discussed at length. Initially occurred with HF exacerbation. This has occurred with increasing frequency and severity. Discussed concerns at length--has known R sided pulmonary nodule (due for follow up in July, last imaged AFTER this started in mid April). Ordered mammogram and L wrist X-ray. Continue Lasix every other day.  Nursing- Please call [name] and ask them to fax lab results from [facility] ASAP.   Terisa Starr, MD  Family Medicine Teaching Service

## 2022-06-14 NOTE — Telephone Encounter (Signed)
Called [name]. They report that they faxed these results on Tuesday.   They will fax them again.   Veronda Prude, RN

## 2022-06-14 NOTE — Addendum Note (Signed)
Addended by: Manson Passey, Gordie Belvin on: 06/14/2022 09:06 AM   Modules accepted: Orders

## 2022-06-15 ENCOUNTER — Telehealth: Payer: Self-pay | Admitting: Family Medicine

## 2022-06-15 NOTE — Telephone Encounter (Signed)
Spoke with [name]. The L arm seems a little more swollen but is not tender  Today she takes lasix She is more restless at night but he believes this is due to her mental illness He is only using O2 occaisional   Recommendations  Keep pulse ox > 88% with O2 If L arm is not increasing or tender or fever then just observe  If worsening O2 requirement or fever or shortness of breath or arm is red and tender then she needs to be seen probably via ambulance   He is appreciative

## 2022-06-15 NOTE — Telephone Encounter (Signed)
See phone note

## 2022-06-15 NOTE — Telephone Encounter (Signed)
  Called [name]. BUN remains slightly elevated. Creatinine improved. Will continue Lasix every other day.   Terisa Starr, MD  Family Medicine Teaching Service   Creatinine 1.77 BUN 42  Sodium 138 K 4.7  Cl 102 Ca 10.2  CO2 20

## 2022-06-18 ENCOUNTER — Ambulatory Visit: Payer: 59 | Admitting: Neurology

## 2022-06-20 DIAGNOSIS — E1122 Type 2 diabetes mellitus with diabetic chronic kidney disease: Secondary | ICD-10-CM | POA: Diagnosis not present

## 2022-06-20 DIAGNOSIS — D631 Anemia in chronic kidney disease: Secondary | ICD-10-CM | POA: Diagnosis not present

## 2022-06-20 DIAGNOSIS — N184 Chronic kidney disease, stage 4 (severe): Secondary | ICD-10-CM | POA: Diagnosis not present

## 2022-06-20 DIAGNOSIS — F03A0 Unspecified dementia, mild, without behavioral disturbance, psychotic disturbance, mood disturbance, and anxiety: Secondary | ICD-10-CM | POA: Diagnosis not present

## 2022-06-20 DIAGNOSIS — I130 Hypertensive heart and chronic kidney disease with heart failure and stage 1 through stage 4 chronic kidney disease, or unspecified chronic kidney disease: Secondary | ICD-10-CM | POA: Diagnosis not present

## 2022-06-20 DIAGNOSIS — I5043 Acute on chronic combined systolic (congestive) and diastolic (congestive) heart failure: Secondary | ICD-10-CM | POA: Diagnosis not present

## 2022-06-21 DIAGNOSIS — N184 Chronic kidney disease, stage 4 (severe): Secondary | ICD-10-CM | POA: Diagnosis not present

## 2022-06-21 DIAGNOSIS — I130 Hypertensive heart and chronic kidney disease with heart failure and stage 1 through stage 4 chronic kidney disease, or unspecified chronic kidney disease: Secondary | ICD-10-CM | POA: Diagnosis not present

## 2022-06-21 DIAGNOSIS — I5043 Acute on chronic combined systolic (congestive) and diastolic (congestive) heart failure: Secondary | ICD-10-CM | POA: Diagnosis not present

## 2022-06-21 DIAGNOSIS — F03A0 Unspecified dementia, mild, without behavioral disturbance, psychotic disturbance, mood disturbance, and anxiety: Secondary | ICD-10-CM | POA: Diagnosis not present

## 2022-06-21 DIAGNOSIS — D631 Anemia in chronic kidney disease: Secondary | ICD-10-CM | POA: Diagnosis not present

## 2022-06-21 DIAGNOSIS — E1122 Type 2 diabetes mellitus with diabetic chronic kidney disease: Secondary | ICD-10-CM | POA: Diagnosis not present

## 2022-06-22 ENCOUNTER — Encounter: Payer: Self-pay | Admitting: Family Medicine

## 2022-06-22 DIAGNOSIS — M25532 Pain in left wrist: Secondary | ICD-10-CM

## 2022-06-25 ENCOUNTER — Ambulatory Visit (HOSPITAL_COMMUNITY)
Admission: RE | Admit: 2022-06-25 | Discharge: 2022-06-25 | Disposition: A | Discharge status: Home / Self Care | Payer: 59 | Source: Ambulatory Visit | Attending: Family Medicine | Admitting: Family Medicine

## 2022-06-25 ENCOUNTER — Other Ambulatory Visit: Payer: Self-pay

## 2022-06-25 ENCOUNTER — Encounter: Payer: Self-pay | Admitting: Family Medicine

## 2022-06-25 ENCOUNTER — Ambulatory Visit (INDEPENDENT_AMBULATORY_CARE_PROVIDER_SITE_OTHER): Payer: 59 | Admitting: Family Medicine

## 2022-06-25 VITALS — BP 132/57 | HR 70

## 2022-06-25 DIAGNOSIS — M7989 Other specified soft tissue disorders: Secondary | ICD-10-CM | POA: Diagnosis not present

## 2022-06-25 DIAGNOSIS — R609 Edema, unspecified: Secondary | ICD-10-CM

## 2022-06-25 DIAGNOSIS — N1832 Chronic kidney disease, stage 3b: Secondary | ICD-10-CM

## 2022-06-25 DIAGNOSIS — M25532 Pain in left wrist: Secondary | ICD-10-CM | POA: Diagnosis not present

## 2022-06-25 NOTE — Progress Notes (Signed)
    SUBJECTIVE:   CHIEF COMPLAINT / HPI:   Swelling in hands and feet. Swelling has been occurring. On left side which is the side she sleeps on . Has been ongoing since discharge from the hospital in Feb.   Can't accurately get a good weight.   Will get x ray today   Swelling usually will go down but now its every day. Son states he tells her about importance of elevating her leg but patient does not like that   On lasix 40mg  every other day  Breathing has been ok. Hasn't dropped below 94  167/91 this morning   Has urinary burning. Does have catheter  Follows with nephrology and urology   PERTINENT  PMH / PSH: ***  OBJECTIVE:   BP (!) 103/56   Pulse 68   SpO2 97%   ***  ASSESSMENT/PLAN:   No problem-specific Assessment & Plan notes found for this encounter.   1+ edema   Cora Collum, DO [facility]

## 2022-06-25 NOTE — Patient Instructions (Signed)
It was great seeing you today!  For your swelling I recommend using an ace bandage and elevating your leg. We are going to check blood work today and I will call if anything is abnormal.  Please also go to the hospital to get your x ray.   Feel free to call with any questions or concerns at any time, at [phone].   Take care,  Dr. Cora Collum [facility]

## 2022-06-26 ENCOUNTER — Telehealth: Payer: Self-pay

## 2022-06-26 DIAGNOSIS — M7989 Other specified soft tissue disorders: Secondary | ICD-10-CM | POA: Insufficient documentation

## 2022-06-26 LAB — CBC
Hematocrit: 33.2 % — ABNORMAL LOW (ref 34.0–46.6)
Hemoglobin: 10.7 g/dL — ABNORMAL LOW (ref 11.1–15.9)
MCH: 27.2 pg (ref 26.6–33.0)
MCHC: 32.2 g/dL (ref 31.5–35.7)
MCV: 84 fL (ref 79–97)
Platelets: 234 10*3/uL (ref 150–450)
RBC: 3.94 x10E6/uL (ref 3.77–5.28)
RDW: 15.2 % (ref 11.7–15.4)
WBC: 10.6 10*3/uL (ref 3.4–10.8)

## 2022-06-26 LAB — BASIC METABOLIC PANEL
BUN/Creatinine Ratio: 22 (ref 12–28)
BUN: 36 mg/dL — ABNORMAL HIGH (ref 8–27)
CO2: 20 mmol/L (ref 20–29)
Calcium: 9.3 mg/dL (ref 8.7–10.3)
Chloride: 102 mmol/L (ref 96–106)
Creatinine, Ser: 1.66 mg/dL — ABNORMAL HIGH (ref 0.57–1.00)
Glucose: 136 mg/dL — ABNORMAL HIGH (ref 70–99)
Potassium: 4.4 mmol/L (ref 3.5–5.2)
Sodium: 136 mmol/L (ref 134–144)
eGFR: 31 mL/min/{1.73_m2} — ABNORMAL LOW (ref >59)

## 2022-06-26 LAB — BRAIN NATRIURETIC PEPTIDE: BNP: 351.6 pg/mL — ABNORMAL HIGH (ref 0.0–100.0)

## 2022-06-26 NOTE — Assessment & Plan Note (Signed)
Patient presents with left sided chronic upper and lower extremity swelling for the past several months. Reassuringly patient well appearing and vitals stable with BP fairly normotensive, normal HR and O2 sat. Discussed using an ace wrap for compression of extremities and elevating leg. Will check blood work including CBC, BMP, BNP and patient plans to go to hospital for left wrist XR immediately after visit. Will continue Lasix 40mg  every other day. Return precautions discussed.

## 2022-06-26 NOTE — Telephone Encounter (Signed)
Please call home health RN She will need to remove current Foley and replace Can take urine sample (UA and culture) off of new Foley I will call son as able Terisa Starr, MD  [facility] Medicine Teaching Service

## 2022-06-26 NOTE — Telephone Encounter (Signed)
Patient LVM on nurse line requesting orders for home health to obtain UA and culture at tomorrow's visit.   Returned call. Spoke with son, [name]. She reports that she is experiencing burning sensation in her pelvis.   Denies fever, chills, back or abdominal pain.   [name] is requesting returned call from Dr. Manson Passey when she is available at [phone]. [name] is also going to reach out to Dr. Alphonsa Overall office regarding this concern.   Veronda Prude, RN

## 2022-06-26 NOTE — Telephone Encounter (Signed)
Called [name] and provided verbal orders for replacement and UA and culture.   Veronda Prude, RN

## 2022-06-27 ENCOUNTER — Telehealth: Payer: Self-pay

## 2022-06-27 ENCOUNTER — Telehealth: Payer: Self-pay | Admitting: Cardiovascular Disease

## 2022-06-27 DIAGNOSIS — I130 Hypertensive heart and chronic kidney disease with heart failure and stage 1 through stage 4 chronic kidney disease, or unspecified chronic kidney disease: Secondary | ICD-10-CM | POA: Diagnosis not present

## 2022-06-27 DIAGNOSIS — F03A0 Unspecified dementia, mild, without behavioral disturbance, psychotic disturbance, mood disturbance, and anxiety: Secondary | ICD-10-CM | POA: Diagnosis not present

## 2022-06-27 DIAGNOSIS — I5043 Acute on chronic combined systolic (congestive) and diastolic (congestive) heart failure: Secondary | ICD-10-CM | POA: Diagnosis not present

## 2022-06-27 DIAGNOSIS — E1122 Type 2 diabetes mellitus with diabetic chronic kidney disease: Secondary | ICD-10-CM | POA: Diagnosis not present

## 2022-06-27 DIAGNOSIS — D631 Anemia in chronic kidney disease: Secondary | ICD-10-CM | POA: Diagnosis not present

## 2022-06-27 DIAGNOSIS — N184 Chronic kidney disease, stage 4 (severe): Secondary | ICD-10-CM | POA: Diagnosis not present

## 2022-06-27 NOTE — Telephone Encounter (Signed)
Son states the BP over the last 2 -3 weeks. States takes medication 7 am and 9 pm All BP's from initial call were given before med  167/90 1:00 yesterday States avg 160's over 85 in the afternoons as well. States patient C/O dizziness but she states this everyday and he is not sure if this is true and attributed to her BP because she has multiple issues.  Though it could be and he doesn't want to say it absolutely isn't related.  No blurry vision or severe headaches.  States she is in good spirits and is awake watching TV or listening to radio. She does have some outside stressors.  He states no changes otherwise, no illness, no injury. States low sodium diet. Ask for any recommendations to help lower her BP as concern for staying high and continued increase

## 2022-06-27 NOTE — Telephone Encounter (Signed)
Pt c/o BP issue: STAT if pt c/o blurred vision, one-sided weakness or slurred speech  1. What are your last 5 BP readings?  167/92 167/89 177/89 179/91  2. Are you having any other symptoms (ex. Dizziness, headache, blurred vision, passed out)? Dizzy   3. What is your BP issue? Patient's son states that BP is consistently go higher and is requesting call back to discuss.

## 2022-06-27 NOTE — Telephone Encounter (Signed)
Received VM from [name] at [facility] regarding verbal orders.   She states that patient is not due to have catheter changed until 7/5. She would like to know how to proceed with orders.   Please advise.   Veronda Prude, RN

## 2022-06-27 NOTE — Telephone Encounter (Signed)
Attempted to call patient's son.  Reached voicemail, left generic voicemail to call back.   Terisa Starr, MD  Family Medicine Teaching Service

## 2022-06-27 NOTE — Telephone Encounter (Signed)
Received call from [facility] RN stating she would not change out patients cath.   She reports it is too dangerous for patient.   She is requesting to speak with Dr. Manson Passey on how to proceed.   Please call Tresa Endo at [phone].  Will forward to PCP.

## 2022-06-27 NOTE — Telephone Encounter (Signed)
Increase Mid-day dose of Hydralazine to 50 mg (2 tabs) -> will need to adjust Rx.    Not overly concerning BP levels --   Bryan Lemma, MD

## 2022-06-27 NOTE — Telephone Encounter (Signed)
Given symptoms please change early--the urine must be drawn off a NEW catheter not an old one.  Terisa Starr, MD  Family Medicine Teaching Service

## 2022-06-27 NOTE — Telephone Encounter (Signed)
Called and confirmed with RN. Terisa Starr, MD  Family Medicine Teaching Service

## 2022-06-27 NOTE — Telephone Encounter (Signed)
[name] LVM on nurse line in regards to catheter change.   She reports she needs specifics as patients cath change is not due until 7/5.   I spoke with [name] and advised of patients UTI symptoms and the need for draw off new cath. I advised we would like this drawn today.   She reports the [facility] RN is scheduled to go out today and she will contact her with VO to change and draw.

## 2022-06-27 NOTE — Telephone Encounter (Signed)
Called and discussed--having many family stressors. Son is in hospital with MI. They have reached out to Dr. Jillyn Hidden office.  Terisa Starr, MD  Family Medicine Teaching Service

## 2022-06-27 NOTE — Telephone Encounter (Signed)
[name] LVM on nurse line x2 returning PCP phone call from earlier this morning.   He reports he would like to discuss Abigail Wallace's consistently high blood pressures.  He reports 172/91 today and reports discussing this with Paige on Monday. BP at visit on 6/10 was 132/57.  Will forward to PCP.

## 2022-06-28 ENCOUNTER — Telehealth: Payer: Self-pay

## 2022-06-28 NOTE — Telephone Encounter (Signed)
No real reason to have stopped hydralazine because of kidney function. My recommendation would be to discuss his blood pressure with whoever stopped the hydralazine.  Bryan Lemma, MD

## 2022-06-28 NOTE — Telephone Encounter (Signed)
Monique from [facility] calling for PT verbal orders as follows:  1 time(s) weekly for 4 week(s).   Verbal orders given per [facility] protocol  Veronda Prude, RN

## 2022-06-28 NOTE — Telephone Encounter (Signed)
Medication was discontinued.  Son states that it was stopped due to kidney function. She does have home health agency coming next week if she would labs. Would you like this medication added back?

## 2022-06-28 NOTE — Telephone Encounter (Signed)
Call to son.  LVM to call the office. LM that per doctor Not overly concerning BP levels  .  Advised the physician mentioned Hydralazine but the son states it was stopped.  Advised the doctor stated to contact who stopped the medication.  If further questions please call the office when we reopen tomorrow

## 2022-06-29 ENCOUNTER — Encounter: Payer: Self-pay | Admitting: Family Medicine

## 2022-06-29 NOTE — Telephone Encounter (Signed)
Patient's son returned call

## 2022-06-29 NOTE — Telephone Encounter (Signed)
Call to son again to advise.    Not overly concerning BP levels  .  Advised the physician mentioned Hydralazine but the son states it was stopped.  Advised the doctor stated to contact who stopped the medication.  If further questions please call the office if any further questions of concerns

## 2022-06-29 NOTE — Telephone Encounter (Signed)
I have messaged son about symptoms. Will see how see is doing to see if we should proceed with additional testing.   Please call [name] and let her know. Terisa Starr, MD  Family Medicine Teaching Service

## 2022-06-29 NOTE — Telephone Encounter (Signed)
Received call from [name] regarding recent urine collection.   She reports that they were unable to perform testing from urine sample.   She also reports that it was very difficult to obtain urine sample in the home. They were unsure if it would be best to obtain sample in clinical setting. They would like to know how best to proceed.   Please return call to [name] at [phone].  Veronda Prude, RN

## 2022-06-29 NOTE — Telephone Encounter (Signed)
Patient returned call. He states he does not understand why the BP is onto concerning.  Patient also saw PCP the 10th.  He wants Dr C to review and discuss her BP issues at her next appt which is for her pacemaker.  He does not want to come in and ask why it is in office rather than remote as it is hard to get patient in office and has to have transportation.  Advised she has had remote and will have further reote pacemaker checks but this is an in office.  I advised Dr C may be able to discuss BP but cannot guarantee as this appt is for the pacemaker check and may not have ample time to discuss.  He ask to make an appt where everything can be done at one time.  I advised the pacemaker appt.s are scheduled different.  He states not understanding and "It is what it is".  He was very nice up until this point and is frustrated.  I  apologized again states he can mention it to the doctor at the pacemaker check and he will advise if needs further time to discuss or if he can respond at that time.  He states again he does not understand multiple appt.s and it is what it is.  Call ended

## 2022-07-02 ENCOUNTER — Telehealth: Payer: Self-pay | Admitting: Family Medicine

## 2022-07-02 ENCOUNTER — Ambulatory Visit (INDEPENDENT_AMBULATORY_CARE_PROVIDER_SITE_OTHER): Payer: 59

## 2022-07-02 DIAGNOSIS — I495 Sick sinus syndrome: Secondary | ICD-10-CM

## 2022-07-02 NOTE — Telephone Encounter (Signed)
Attempted to call son about urine sample. MyChart was sent Friday. HH was unable to run test (?)  I need to know symptoms his mom is having currently if he calls back  Terisa Starr, MD  [facility] Medicine Teaching Service

## 2022-07-02 NOTE — Telephone Encounter (Signed)
Called [name], [name] was unavailable.   Spoke with Carly and provided update.   Veronda Prude, RN

## 2022-07-02 NOTE — Telephone Encounter (Signed)
Patient's son, Abigail Wallace returns call to nurse line.   Reports that patient was having some pelvic burning.   Has not mentioned any burning over the weekend. Reports that urine is darker than normal. Per son, she is drinking fluids, however, cardiologist wants to limit her to 1-2 liters per day.   Patient reports that [facility] RN placed sample in bottle and not in specimen cup and that was why sample was not able to be used.   Denies fever, back or abdominal pain.   She has been taking OTC Azo for symptoms. He is unsure if this medication is helping or if it is "placebo effect."   Will forward to PCP for further advisement.   Veronda Prude, RN

## 2022-07-02 NOTE — Telephone Encounter (Signed)
Will address the BP at the appt.

## 2022-07-02 NOTE — Telephone Encounter (Signed)
Please recommend monitoring symptoms. Please have them call if she develops recurrent symptoms, fevers, or change in symptoms. No repeat culture at this time.  Terisa Starr, MD  Family Medicine Teaching Service

## 2022-07-02 NOTE — Telephone Encounter (Signed)
Called patient's son to update him on recent blood work and XR which showed soft tissue swelling without fracture or other bony changes. Kidney function stable from last check, BNP elevated. Currently on Lasix 40mg  every other day. Discussed that she can increase it to daily for now until her cardiology appointment scheduled this week and recommended discussing with Dr. Salena Saner at that time as well.   Son asks about recent labs from home health nurse which I do not see scanned in the chart yet and I let him know. Has concerns about her urine. Will route this to PCP Dr. Manson Passey in case she has seen these results.

## 2022-07-03 ENCOUNTER — Telehealth: Payer: Self-pay | Admitting: Family Medicine

## 2022-07-03 DIAGNOSIS — R911 Solitary pulmonary nodule: Secondary | ICD-10-CM

## 2022-07-03 LAB — CUP PACEART REMOTE DEVICE CHECK
Battery Impedance: 1380 Ohm
Battery Remaining Longevity: 43 mo
Battery Voltage: 2.77 V
Brady Statistic AP VP Percent: 69 %
Brady Statistic AP VS Percent: 0 %
Brady Statistic AS VP Percent: 31 %
Brady Statistic AS VS Percent: 0 %
Date Time Interrogation Session: 20240617090019
Implantable Lead Connection Status: 753985
Implantable Lead Connection Status: 753985
Implantable Lead Implant Date: 20070414
Implantable Lead Implant Date: 20070914
Implantable Lead Location: 753859
Implantable Lead Location: 753860
Implantable Lead Model: 4092
Implantable Lead Model: 5594
Implantable Pulse Generator Implant Date: 20160816
Lead Channel Impedance Value: 508 Ohm
Lead Channel Impedance Value: 605 Ohm
Lead Channel Pacing Threshold Amplitude: 0.625 V
Lead Channel Pacing Threshold Amplitude: 0.875 V
Lead Channel Pacing Threshold Pulse Width: 0.4 ms
Lead Channel Pacing Threshold Pulse Width: 0.4 ms
Lead Channel Setting Pacing Amplitude: 2 V
Lead Channel Setting Pacing Amplitude: 2.5 V
Lead Channel Setting Pacing Pulse Width: 0.4 ms
Lead Channel Setting Sensing Sensitivity: 4 mV
Zone Setting Status: 755011
Zone Setting Status: 755011

## 2022-07-03 NOTE — Telephone Encounter (Signed)
CT has been scheduled patient's son is aware of appointment date. Penni Bombard CMA

## 2022-07-03 NOTE — Telephone Encounter (Signed)
Called son. His mom is doing better. Has not complained of urinary symptoms but son still concerned.  RN Team please call [name]. Can draw specimen off new catheter if just replaced. Please ask them to use proper collection device and send for urine culture only.   Discussed need for follow up CT from April. CT ordered.   Red Team- please call to schedule CT. Please call son with time and date.  Terisa Starr, MD  Family Medicine Teaching Service

## 2022-07-04 DIAGNOSIS — F03A0 Unspecified dementia, mild, without behavioral disturbance, psychotic disturbance, mood disturbance, and anxiety: Secondary | ICD-10-CM | POA: Diagnosis not present

## 2022-07-04 DIAGNOSIS — E1122 Type 2 diabetes mellitus with diabetic chronic kidney disease: Secondary | ICD-10-CM | POA: Diagnosis not present

## 2022-07-04 DIAGNOSIS — I5043 Acute on chronic combined systolic (congestive) and diastolic (congestive) heart failure: Secondary | ICD-10-CM | POA: Diagnosis not present

## 2022-07-04 DIAGNOSIS — D631 Anemia in chronic kidney disease: Secondary | ICD-10-CM | POA: Diagnosis not present

## 2022-07-04 DIAGNOSIS — I130 Hypertensive heart and chronic kidney disease with heart failure and stage 1 through stage 4 chronic kidney disease, or unspecified chronic kidney disease: Secondary | ICD-10-CM | POA: Diagnosis not present

## 2022-07-04 DIAGNOSIS — N184 Chronic kidney disease, stage 4 (severe): Secondary | ICD-10-CM | POA: Diagnosis not present

## 2022-07-05 ENCOUNTER — Encounter: Payer: Self-pay | Admitting: Cardiovascular Disease

## 2022-07-05 ENCOUNTER — Ambulatory Visit: Payer: 59 | Attending: Cardiovascular Disease | Admitting: Cardiovascular Disease

## 2022-07-05 VITALS — BP 134/82 | HR 69 | Ht 66.0 in | Wt 289.0 lb

## 2022-07-05 DIAGNOSIS — I5042 Chronic combined systolic (congestive) and diastolic (congestive) heart failure: Secondary | ICD-10-CM | POA: Diagnosis not present

## 2022-07-05 DIAGNOSIS — I7143 Infrarenal abdominal aortic aneurysm, without rupture: Secondary | ICD-10-CM | POA: Diagnosis not present

## 2022-07-05 DIAGNOSIS — I442 Atrioventricular block, complete: Secondary | ICD-10-CM | POA: Diagnosis not present

## 2022-07-05 DIAGNOSIS — E782 Mixed hyperlipidemia: Secondary | ICD-10-CM | POA: Diagnosis not present

## 2022-07-05 DIAGNOSIS — I4819 Other persistent atrial fibrillation: Secondary | ICD-10-CM | POA: Diagnosis not present

## 2022-07-05 DIAGNOSIS — Z950 Presence of cardiac pacemaker: Secondary | ICD-10-CM

## 2022-07-05 DIAGNOSIS — I10 Essential (primary) hypertension: Secondary | ICD-10-CM | POA: Diagnosis not present

## 2022-07-05 DIAGNOSIS — D6869 Other thrombophilia: Secondary | ICD-10-CM

## 2022-07-05 NOTE — Progress Notes (Signed)
Patient ID: Abigail Wallace, female   DOB: 08-26-1945, 77 y.o.   MRN: [id number]    Cardiology Office Note    Date:  07/05/2022   ID:  [address] [place], [place] [birth year], MRN [id number]  PCP:  Westley Chandler, MD  Cardiologist:   Thurmon Fair, MD   Chief Complaint  Patient presents with   Pacemaker Check   Atrial Fibrillation     History of Present Illness:  Abigail Wallace is a 77 y.o. female with complete heart block and sinus node dysfunction who presents for atrial fibrillation and a pacemaker check (initial pacemaker and current leads implanted in 2007, most recent generator change [employer] Adapta 2016).  Other significant medical problems include obstructive sleep apnea, morbid obesity, AAA, mixed hyperlipidemia and history of schizophrenia, with a major decompensation a few months ago.  She is usually accompanied by her son, Abigail Wallace. He is unable to be here today, but we had him on the phone.  She is in a wheelchair, not walking. Has an indwelling urinary catheter. She has ankle swelling at the end of every day, but it resolves overnight. No dyspnea. Unaware of palpitations. No syncope.  She has not had any falls or bleeding complications and continues to take Eliquis.  The echo checked 03/15/2022 during her hospitalization showed severe LVH and mildly depressed left ventricular systolic function with EF 45-50% with apical hypokinesis (I suspect this is pacing related dyskinesis).  There is chordal SAM but no LV outflow tract gradient.  There were no serious valvular abnormalities.  She is pacemaker dependent.  She occasionally has idioventricular escape at 30 bpm but this is not always present.   She has been in persistent atrial fibrillation for the last 6 months. There has not been a notable change in her clinical status with the arrhythmia.  She is morbidly obese with a BMI of 46. Metabolic control is not bad with a hemoglobin A1c of 5.8%.  She has a small infrarenal abdominal  aortic aneurysm that was measured with a diameter of 3.4 cm on ultrasound.  This area of the aorta measured 2.9 cm on CT of the abdomen.  She does not have known CAD or PAD, other than the infrarenal AAA.   She initially received a dual-chamber permanent pacemaker in 2007 for symptomatic AV block. She has minimal coronary atherosclerosis by cardiac catheterization performed in 2007 and no evidence of insufficiency by nuclear perfusion testing in 2012. By echocardiography she has normal left ventricular size and systolic function and no major structural cardiac abnormalities.    Past Medical History:  Diagnosis Date   Abdominal wall hernia 01/2017   Advanced care planning/counseling discussion 09/29/2021   Atrial fibrillation (HCC)    CHF (congestive heart failure) (HCC)    CKD (chronic kidney disease)    COPD (chronic obstructive pulmonary disease) (HCC)    Coronary artery disease    NON-CRITICAL   Ectasis aorta (HCC)    GERD (gastroesophageal reflux disease)    Gout    Hypertension    Memory difficulties 12/02/2013   Pacemaker    Psychosis (HCC)    Tardive dyskinesia 12/02/2013   Type 2 diabetes mellitus (HCC)     Past Surgical History:  Procedure Laterality Date   CARDIAC CATHETERIZATION  2007   Non-critical.    CARDIOVERSION N/A 01/27/2021   Procedure: CARDIOVERSION;  Surgeon: Sande Rives, MD;  Location: [facility] ENDOSCOPY;  Service: Cardiovascular;  Laterality: N/A;   CHOLECYSTECTOMY     ELBOW SURGERY  EP IMPLANTABLE DEVICE N/A 08/31/2014   Procedure: PPM Generator Changeout;  Surgeon: Thurmon Fair, MD;  Location: [facility] INVASIVE CV LAB;  Service: Cardiovascular;  Laterality: N/A;   INSERT / REPLACE / REMOVE PACEMAKER  2007   [place]/SYMPTOMATIC HEART BLOCK   PACEMAKER PLACEMENT  09/28/2005   2/2 SSS?   Persantine stress test  05/01/2010   EF 66%. Normal LV sys fx. Unchanged from previous studies.    TRANSTHORACIC ECHOCARDIOGRAM  09/08/10   SEVERE CONCENTRIC  HYPERTROPHY.LV FUNCTION WAS VIGOROUS.EF 65%-70%.VENTRICULAR SEPTUM-INCOORDINATE MOTION.LEFT ATRIUM-MILDLY DILATED.TRIVIAL TR.   TUBAL LIGATION      Outpatient Medications Prior to Visit  Medication Sig Dispense Refill   acetaminophen (TYLENOL) 500 MG tablet Take 1,000 mg by mouth every 6 (six) hours as needed for mild pain or headache.     allopurinol (ZYLOPRIM) 100 MG tablet Take 1 tablet (100 mg total) by mouth daily. 90 tablet 3   amLODipine (NORVASC) 10 MG tablet Take 1 tablet (10 mg total) by mouth daily. 90 tablet 3   apixaban (ELIQUIS) 5 MG TABS tablet Take 1 tablet (5 mg total) by mouth 2 (two) times daily. 180 tablet 3   Blood Glucose Monitoring Suppl (ONETOUCH VERIO) w/Device KIT Check blood sugar daily. E11.9 1 kit 0   cholecalciferol (VITAMIN D3) 25 MCG (1000 UNIT) tablet Take 1 tablet (1,000 Units total) by mouth daily. 90 tablet 3   diclofenac Sodium (VOLTAREN) 1 % GEL Apply 2 g topically 4 (four) times daily as needed (pain). 350 g 2   divalproex (DEPAKOTE) 250 MG DR tablet Take 1 tablet (250 mg total) by mouth every 12 (twelve) hours. 60 tablet 1   doxycycline (VIBRA-TABS) 100 MG tablet Take 1 tablet (100 mg total) by mouth 2 (two) times daily. 10 tablet 0   furosemide (LASIX) 40 MG tablet Take 1 tablet (40 mg total) by mouth every other day. 90 tablet 3   glucose blood (ONETOUCH VERIO) test strip Please use to check blood sugar once daily. E11.9 100 each 12   hydrocortisone cream 0.5 % Apply 1 application topically 2 (two) times daily. Apply WITH Nystatin under left breast 30 g 0   Incontinence Supplies MISC 1 Units by Does not apply route as needed. 100 each prn   ipratropium-albuterol (DUONEB) 0.5-2.5 (3) MG/3ML SOLN Take 3 mLs by nebulization every 6 (six) hours as needed. 360 mL 3   Lancet Device MISC 1 Device by Does not apply route 3 (three) times a week. 1 each 11   metoprolol tartrate (LOPRESSOR) 50 MG tablet Take 2 tablets (100 mg total) by mouth 2 (two) times daily.  180 tablet 3   mupirocin cream (BACTROBAN) 2 % Apply 1 application. topically 2 (two) times daily. (Patient taking differently: Apply 1 application  topically 2 (two) times daily as needed (to affected areas- for irritation).) 15 g 3   nystatin (MYCOSTATIN/NYSTOP) powder Apply 1 Application topically 3 (three) times daily. (Patient taking differently: Apply 1 Application topically 3 (three) times daily as needed (under the breasts and skin folds- for irritation).) 60 g 1   OLANZapine (ZYPREXA) 5 MG tablet Take 5 mg by mouth at bedtime.     OneTouch Delica Lancets 33G MISC Use to test once daily.  DX code:E11.9 100 each 3   OVER THE COUNTER MEDICATION Apply 1 application  topically See admin instructions. Caldesene Medicated Protecting Powder- Apply under the breasts and between the skin folds 2 times a day as needed for irritation  pravastatin (PRAVACHOL) 40 MG tablet TAKE ONE TABLET BY MOUTH EVERY EVENING 90 tablet 2   senna (SENOKOT) 8.6 MG TABS tablet Take 1 tablet (8.6 mg total) by mouth daily as needed for mild constipation. 120 tablet 0   sodium bicarbonate 650 MG tablet Take 1 tablet (650 mg total) by mouth 2 (two) times daily. 180 tablet 3   TRADJENTA 5 MG TABS tablet TAKE ONE TABLET BY MOUTH DAILY 90 tablet 3   triamcinolone cream (KENALOG) 0.1 % Apply 1 Application topically 2 (two) times daily. Apply to L ear 30 g 0   umeclidinium-vilanterol (ANORO ELLIPTA) 62.5-25 MCG/ACT AEPB Inhale 1 puff into the lungs daily. 60 each 1   albuterol (VENTOLIN HFA) 108 (90 Base) MCG/ACT inhaler Inhale 2 puffs into the lungs every 6 (six) hours as needed for wheezing or shortness of breath. (Patient not taking: Reported on 07/05/2022) 18 g 3   amoxicillin-clavulanate (AUGMENTIN) 500-125 MG tablet Take 1 tablet by mouth in the morning and at bedtime. (Patient not taking: Reported on 07/05/2022) 10 tablet 0   No facility-administered medications prior to visit.     Allergies:   Aripiprazole, Clozapine,  Haloperidol lactate, Benadryl [diphenhydramine hcl], Benztropine, Latuda [lurasidone hcl], Lorazepam, Remeron [mirtazapine], Keflex [cephalexin], Codeine, and Latex   Social History   Socioeconomic History   Marital status: Divorced    Spouse name: Not on file   Number of children: 3   Years of education: 14   Highest education level: Associate degree: academic program  Occupational History   Not on file  Tobacco Use   Smoking status: Former    Types: Cigarettes    Quit date: 02/2022    Years since quitting: 0.3   Smokeless tobacco: Never   Tobacco comments:    1cig a day    07/05/2022 patient quit when going to the hospital 02/2022  Vaping Use   Vaping Use: Former  Substance and Sexual Activity   Alcohol use: No    Alcohol/week: 0.0 standard drinks of alcohol   Drug use: No   Sexual activity: Not Currently    Partners: Male    Birth control/protection: Post-menopausal    Comment: BTL  Other Topics Concern   Not on file  Social History Narrative   Patient lives with her son in [place] in a one story apartment.    Son is patients caregiver (Abigail Wallace.)    Patient is wheelchair bound, except for bedtime.    Patient enjoys listing to music and visiting family.    Social Determinants of Health   Financial Resource Strain: Low Risk  (03/23/2022)   Overall Financial Resource Strain (CARDIA)    Difficulty of Paying Living Expenses: Not very hard  Food Insecurity: No Food Insecurity (03/23/2022)   Hunger Vital Sign    Worried About Running Out of Food in the Last Year: Never true    Ran Out of Food in the Last Year: Never true  Transportation Needs: No Transportation Needs (03/23/2022)   PRAPARE - Administrator, Civil Service (Medical): No    Lack of Transportation (Non-Medical): No  Physical Activity: Inactive (12/27/2020)   Exercise Vital Sign    Days of Exercise per Week: 0 days    Minutes of Exercise per Session: 0 min  Stress: No Stress Concern Present  (12/27/2020)   [employer] of Occupational Health - Occupational Stress Questionnaire    Feeling of Stress : Only a little  Social Connections: Socially Isolated (12/27/2020)   Social  Connection and Isolation Panel [NHANES]    Frequency of Communication with Friends and Family: More than three times a week    Frequency of Social Gatherings with Friends and Family: More than three times a week    Attends Religious Services: Never    Database administrator or Organizations: No    Attends Banker Meetings: Never    Marital Status: Divorced     Family History:  The patient's family history includes Diabetes type II in her son; Heart disease in her father and mother; Stroke in her sister.   ROS:   Please see the history of present illness.    All other systems reviewed and are negative.   PHYSICAL EXAM:   VS:  BP 134/82 (BP Location: Left Leg, Patient Position: Sitting, Cuff Size: Large)   Pulse 69   Ht 5\' 6"  (1.676 m)   Wt 289 lb (131.1 kg) Comment: Patient stated 250lbs but use what we have  SpO2 97%   BMI 46.65 kg/m       General: Alert, oriented x3, no distress, morbidly obese.  Healthy left subclavian pacemaker site. Head: no evidence of trauma, PERRL, EOMI, no exophtalmos or lid lag, no myxedema, no xanthelasma; normal ears, nose and oropharynx Neck: normal jugular venous pulsations and no hepatojugular reflux; brisk carotid pulses without delay and no carotid bruits Chest: clear to auscultation, no signs of consolidation by percussion or palpation, normal fremitus, symmetrical and full respiratory excursions Cardiovascular: normal position and quality of the apical impulse, regular rhythm, normal first and second heart sounds, no murmurs, rubs or gallops Abdomen: no tenderness or distention, no masses by palpation, no abnormal pulsatility or arterial bruits, normal bowel sounds, no hepatosplenomegaly Extremities: 2+ bilateral calf edema; 2+ radial, ulnar and  brachial pulses bilaterally; 2+ right femoral, posterior tibial and dorsalis pedis pulses; 2+ left femoral, posterior tibial and dorsalis pedis pulses; no subclavian or femoral bruits Neurological: grossly nonfocal Psych: Normal mood and affect    Wt Readings from Last 3 Encounters:  07/05/22 289 lb (131.1 kg)  06/01/22 289 lb (131.1 kg)  03/26/22 268 lb (121.6 kg)    Studies/Labs Reviewed:   ECHO 03/15/2022   1. Given severe LVH can consider further w/u for amyloid or hypertrophic  cardiomyopathy if clinically inidcated.   2. Distal septal and apical hypokinesis . Left ventricular ejection  fraction, by estimation, is 45 to 50%. The left ventricle has mildly  decreased function. The left ventricle has no regional wall motion  abnormalities. There is severe left ventricular  hypertrophy. Left ventricular diastolic parameters are indeterminate.   3. Pacing wires in RA/RV . Right ventricular systolic function is normal.  The right ventricular size is normal. There is normal pulmonary artery  systolic pressure.   4. Chordal SAM with no LVOT gradient . The mitral valve is abnormal.  Trivial mitral valve regurgitation. No evidence of mitral stenosis.   5. The aortic valve is tricuspid. There is mild calcification of the  aortic valve. There is mild thickening of the aortic valve. Aortic valve  regurgitation is not visualized. Aortic valve sclerosis is present, with  no evidence of aortic valve stenosis.   6. The inferior vena cava is normal in size with greater than 50%  respiratory variability, suggesting right atrial pressure of 3 mmHg.   EKG:  EKG is  shows background atrial fibrillation with complete heart block and 100% ventricular paced rhythm.    Recent Labs: 02/22/2022: TSH 3.310 03/15/2022: Magnesium  1.9 06/01/2022: ALT 9 06/25/2022: BNP 351.6; BUN 36; Creatinine, Ser 1.66; Hemoglobin 10.7; Platelets 234; Potassium 4.4; Sodium 136  Lipid Panel     Component Value Date/Time    CHOL 149 04/06/2022 1214   TRIG 195 (H) 04/06/2022 1214   HDL 48 04/06/2022 1214   CHOLHDL 3.1 04/06/2022 1214   CHOLHDL 2.7 11/19/2010 0605   VLDL 15 11/19/2010 0605   LDLCALC 69 04/06/2022 1214   LDLDIRECT 80 03/20/2012 1510     ASSESSMENT:    1. Persistent atrial fibrillation (HCC)   2. Acquired thrombophilia (HCC)   3. Chronic combined systolic and diastolic heart failure (HCC)   4. Essential hypertension   5. CHB (complete heart block) (HCC)   6. Pacemaker   7. Mixed hyperlipidemia   8. Infrarenal abdominal aortic aneurysm (AAA) without rupture (HCC)          PLAN:  In order of problems listed above:  Afib: Persistent for 6 months, no clear impact on hemodynamics. Further attempt at maintenance of SR are unlikely to be successful. She is not a good candidate for ablation or antiarrhythmics.  Will change approach to the of permanent atrial fibrillation.  CHADSVasc 6-7 (age 24, gender, DM, HTN, HF, +/-PAD). Anticoagulation: Has not had any bleeding complications CHF: She has mild evidence of hypervolemia, ankle swelling could also be related to amlodipine therapy.  Until recently, heart failure has generally been well compensated until recently and EF remains greater than 40%, not yet indication for CRT-P.  The wall motion abnormalities described are due to profound RV pacing-related apex-to-base and septal-lateral dyssynchrony.  Options for medical therapy are limited.  History of intolerance to ARB and we recently had to decrease her beta-blocker due to side effects.  Discussed SGLT2 inhibitors, but she has problems with fungal infections and has an indwelling catheter.  Has reactive airway disease so we are avoiding carvedilol.  HTN: Adequately controlled SSS: Was present at the time of pacemaker implantation, but now she is in atrial fibrillation. CHB:  Pacemaker dependent. Option for CRT-P upgrade, but she has had RV pacing for years without heart failure, EF is still  greater than 40%. PPM: Change to VVIR.  Continue remote downloads every 3 months. HLP: LDL was 69 in March, within target range under 70. AAA: Relatively small at 3.5 cm diameter, stable on yearly ultrasound most recently on 05/25/2022.  Will be due for ultrasound later this year, but I do not think she is a good candidate for surgical therapy or even EVAR due to her comorbid conditions. Obesity: Morbidly obese with numerous comorbid conditions. DM: Well-controlled OSA: Compliant with CPAP and denies daytime hypersomnolence. COPD : Has quit smoking Schizophrenia: Recent worsening psychosis after she stopped taking her medicines.  Seems to have reached some degree of stability right now.   Medication Adjustments/Labs and Tests Ordered: Current medicines are reviewed at length with the patient today.  Concerns regarding medicines are outlined above.  Medication changes, Labs and Tests ordered today are listed in the Patient Instructions below. Patient Instructions  Medication Instructions:  No changes *If you need a refill on your cardiac medications before your next appointment, please call your pharmacy*  Follow-Up: At [facility], you and your health needs are our priority.  As part of our continuing mission to provide you with exceptional heart care, we have created designated Provider Care Teams.  These Care Teams include your primary Cardiologist (physician) and Advanced Practice Providers (APPs -  Physician Assistants and Nurse  Practitioners) who all work together to provide you with the care you need, when you need it.  We recommend signing up for the patient portal called "MyChart".  Sign up information is provided on this After Visit Summary.  MyChart is used to connect with patients for Virtual Visits (Telemedicine).  Patients are able to view lab/test results, encounter notes, upcoming appointments, etc.  Non-urgent messages can be sent to your provider as well.   To learn  more about what you can do with MyChart, go to [email or web address].    Your next appointment:   1 year(s)  Provider:   Thurmon Fair, MD         Signed, Thurmon Fair, MD  07/05/2022 9:08 PM    [facility] [address] [place], [place], [place]  [address] Phone: [phone]; Fax: [phone]

## 2022-07-05 NOTE — Patient Instructions (Signed)
Medication Instructions:  No changes *If you need a refill on your cardiac medications before your next appointment, please call your pharmacy*  Follow-Up: At [facility], you and your health needs are our priority.  As part of our continuing mission to provide you with exceptional heart care, we have created designated Provider Care Teams.  These Care Teams include your primary Cardiologist (physician) and Advanced Practice Providers (APPs -  Physician Assistants and Nurse Practitioners) who all work together to provide you with the care you need, when you need it.  We recommend signing up for the patient portal called "MyChart".  Sign up information is provided on this After Visit Summary.  MyChart is used to connect with patients for Virtual Visits (Telemedicine).  Patients are able to view lab/test results, encounter notes, upcoming appointments, etc.  Non-urgent messages can be sent to your provider as well.   To learn more about what you can do with MyChart, go to [email or web address].    Your next appointment:   1 year(s)  Provider:   Mihai Croitoru, MD     

## 2022-07-05 NOTE — Telephone Encounter (Signed)
Called and provided verbal orders to [name] at [employer].   Veronda Prude, RN

## 2022-07-06 ENCOUNTER — Telehealth: Payer: Self-pay | Admitting: Family Medicine

## 2022-07-06 MED ORDER — AMOXICILLIN 500 MG PO CAPS: 500.0000 mg | ORAL_CAPSULE | Freq: Two times a day (BID) | ORAL | 0 refills | Status: DC

## 2022-07-06 NOTE — Telephone Encounter (Signed)
Called son about UTI symptoms. Having odor, burning. Has catheter, recently replaced in last few days. New culture not drawn by [facility]. Will treat empirically given weekend, symptoms, and inability to obtain culture. Discussed risks including resistance, incorrect treatment. All questions answered.  Terisa Starr, MD  Family Medicine Teaching Service

## 2022-07-06 NOTE — Telephone Encounter (Signed)
Patients son Ferne Reus called stating he is almost positive patient has a UTI. He is wanting nurse or doctor to call him back.   Call back number is: [phone].   Please Advise.  Thanks!

## 2022-07-09 ENCOUNTER — Encounter: Payer: Self-pay | Admitting: Family Medicine

## 2022-07-09 ENCOUNTER — Telehealth: Payer: Self-pay | Admitting: Family Medicine

## 2022-07-09 NOTE — Telephone Encounter (Signed)
After Hours Call: Spoke with her Son and caretaker, [name]. He is concerned because her forearm is more swollen today than usual.  Says she is on Lasix once a day and he wonders if he should give her an extra dose.  According to him, their Aid did not show up this afternoon and she was up in a chair longer than usual.  She has no increased swelling in her lower extremities.  He says it is hard to monitor her intake and output but it is usually pretty even.  He feels like today she may have had more intake than usual.  She is not having any shortness of breath or increased work of breathing per his report.  He tells me it is quite difficult to get her to the emergency room because they have to call an ambulance.    She is not having any current pain.  I told him to elevate the arm is much as possible.  He said he had also been applying ice some.    I told him I did not think it was necessary to give an extra dose of Lasix.  Should it get worse, he needs to call us back tonight. I will route this message to the PCP and he request they call him tomorrow for further instructions

## 2022-07-09 NOTE — Telephone Encounter (Signed)
Patient's son Ferne Reus) wanted to inform Dr. Manson Passey that the patient has had her catheter in for two weeks now and that her urine specimen would be collected for the catheter. Penni Bombard CMA

## 2022-07-10 ENCOUNTER — Telehealth: Payer: Self-pay | Admitting: Family Medicine

## 2022-07-10 DIAGNOSIS — K5904 Chronic idiopathic constipation: Secondary | ICD-10-CM

## 2022-07-10 DIAGNOSIS — F03A0 Unspecified dementia, mild, without behavioral disturbance, psychotic disturbance, mood disturbance, and anxiety: Secondary | ICD-10-CM | POA: Diagnosis not present

## 2022-07-10 DIAGNOSIS — I5043 Acute on chronic combined systolic (congestive) and diastolic (congestive) heart failure: Secondary | ICD-10-CM | POA: Diagnosis not present

## 2022-07-10 DIAGNOSIS — I130 Hypertensive heart and chronic kidney disease with heart failure and stage 1 through stage 4 chronic kidney disease, or unspecified chronic kidney disease: Secondary | ICD-10-CM | POA: Diagnosis not present

## 2022-07-10 DIAGNOSIS — N184 Chronic kidney disease, stage 4 (severe): Secondary | ICD-10-CM | POA: Diagnosis not present

## 2022-07-10 DIAGNOSIS — I300 Acute nonspecific idiopathic pericarditis: Secondary | ICD-10-CM | POA: Diagnosis not present

## 2022-07-10 DIAGNOSIS — E1122 Type 2 diabetes mellitus with diabetic chronic kidney disease: Secondary | ICD-10-CM | POA: Diagnosis not present

## 2022-07-10 DIAGNOSIS — D631 Anemia in chronic kidney disease: Secondary | ICD-10-CM | POA: Diagnosis not present

## 2022-07-10 MED ORDER — SENNA 8.6 MG PO TABS
1.0000 | ORAL_TABLET | Freq: Every day | ORAL | 3 refills | Status: DC | PRN
Start: 2022-07-10 — End: 2022-12-11

## 2022-07-10 MED ORDER — METOPROLOL TARTRATE 50 MG PO TABS: 100.0000 mg | ORAL_TABLET | Freq: Two times a day (BID) | ORAL | 3 refills | Status: DC

## 2022-07-10 MED ORDER — IPRATROPIUM-ALBUTEROL 0.5-2.5 (3) MG/3ML IN SOLN: 3.0000 mL | RESPIRATORY_TRACT | 3 refills | Status: DC | PRN

## 2022-07-10 NOTE — Telephone Encounter (Signed)
See separate phone note all questions answered

## 2022-07-10 NOTE — Telephone Encounter (Signed)
Called son to discuss multiple issues. Mom is doing better. Urine culture deferred at this time. Swelling improved. All questions answered.  Terisa Starr, MD  Family Medicine Teaching Service

## 2022-07-10 NOTE — Addendum Note (Signed)
Addended by: Manson Passey, Aika Brzoska on: 07/10/2022 09:44 AM   Modules accepted: Orders

## 2022-07-11 ENCOUNTER — Telehealth: Payer: Self-pay | Admitting: Student

## 2022-07-11 ENCOUNTER — Telehealth: Payer: Self-pay | Admitting: Physician Assistant

## 2022-07-11 DIAGNOSIS — E1122 Type 2 diabetes mellitus with diabetic chronic kidney disease: Secondary | ICD-10-CM | POA: Diagnosis not present

## 2022-07-11 DIAGNOSIS — N184 Chronic kidney disease, stage 4 (severe): Secondary | ICD-10-CM | POA: Diagnosis not present

## 2022-07-11 DIAGNOSIS — D631 Anemia in chronic kidney disease: Secondary | ICD-10-CM | POA: Diagnosis not present

## 2022-07-11 DIAGNOSIS — I130 Hypertensive heart and chronic kidney disease with heart failure and stage 1 through stage 4 chronic kidney disease, or unspecified chronic kidney disease: Secondary | ICD-10-CM | POA: Diagnosis not present

## 2022-07-11 DIAGNOSIS — F03A0 Unspecified dementia, mild, without behavioral disturbance, psychotic disturbance, mood disturbance, and anxiety: Secondary | ICD-10-CM | POA: Diagnosis not present

## 2022-07-11 DIAGNOSIS — I5043 Acute on chronic combined systolic (congestive) and diastolic (congestive) heart failure: Secondary | ICD-10-CM | POA: Diagnosis not present

## 2022-07-11 NOTE — Telephone Encounter (Signed)
**  After Hours/ Emergency Line Call**  Received a page to call [phone].  Patient: Abigail Wallace  Caller: Caretaker of patient  Confirmed name & DOB of patient with caller  Subjective:  Son notes that Pt's BP has been running high, he talked with her heart doctor, and they didn't have a problem. Note's BP elevated to high of 198/113, 188/95 mid day. Note's SBP > 160 and DBP >90. Patient denies any CP, but is constantly SOB/at her baseline. Note's peeing like normal and no abdominal, HA or changes in vision. Son note's that she see's a cardiologist, and they told them about this last week, but they didn't seem too concerned.   Objective:  Observations: NAD   Assessment & Plan  Abigail Wallace is a 77 y.o. female with PMHx s/f CHF, sick sinus syndrome, PAF, HTN who calls with the following complaints and concerns asymptomatic elvated BP. Discussed w/ patient that he should reach out to cardioloigist to manage this, as I wouldn't want to adjust their meds now. Told the sone, that I would get him an appointment in the office with his PCP, but he should reach out to cardiologist about BP meds first. Discussed this is something that would be adjusted at an office visit, and not over the phone.   The son told  me about patient's transportation issues, and difficulty getting around/needing an ambulance transport to get to appointment. I told the son, that cardiology will likely not make any changes over the phone, but may be willing to consider, given transportation needs.   Discussed monitoring for HA, changes in vision, CP, worsening SOB, loss of making urine. With any of these symptoms, patient is to call EMS.  The son note's that he will call cardiology today.   Recommendations:  Schedule appt with cardiology Can schedule appt w/ PCP if cardiology not assisting   -- Red flags discussed.    Bess Kinds, MD [facility], PGY-1

## 2022-07-11 NOTE — Telephone Encounter (Signed)
Patient's son contacted after-hours answering service regarding persistently elevated blood pressure for the past 3 weeks.  Although patient's blood pressure was 130s during office visit with Dr. Royann Shivers weeks ago, her son says that it is not her normal blood pressure.  Her systolic blood pressure has been more so in the 160-190s recently.  This is on the [employer] wrist cuff, her blood pressure was rechecked by home health nurse using a manual blood pressure cuff, blood pressure was 188/95 at the time, consistent with digital blood pressure cuff reading.  I recommend that you increase metoprolol to tartrate to 100 mg 3 times a day rather than twice a day.  She has a pacemaker.  I would prefer her to be seen in the office, however her son says she is immobile at this time and cannot come to the office anytime soon.  I asked her son to keep a blood pressure diary with 2 readings per day for the next week and forward it to [place] via MyChart.

## 2022-07-16 DIAGNOSIS — J811 Chronic pulmonary edema: Secondary | ICD-10-CM | POA: Diagnosis not present

## 2022-07-17 DIAGNOSIS — D631 Anemia in chronic kidney disease: Secondary | ICD-10-CM | POA: Diagnosis not present

## 2022-07-17 DIAGNOSIS — N184 Chronic kidney disease, stage 4 (severe): Secondary | ICD-10-CM | POA: Diagnosis not present

## 2022-07-17 DIAGNOSIS — I5043 Acute on chronic combined systolic (congestive) and diastolic (congestive) heart failure: Secondary | ICD-10-CM | POA: Diagnosis not present

## 2022-07-17 DIAGNOSIS — E1122 Type 2 diabetes mellitus with diabetic chronic kidney disease: Secondary | ICD-10-CM | POA: Diagnosis not present

## 2022-07-17 DIAGNOSIS — I130 Hypertensive heart and chronic kidney disease with heart failure and stage 1 through stage 4 chronic kidney disease, or unspecified chronic kidney disease: Secondary | ICD-10-CM | POA: Diagnosis not present

## 2022-07-18 DIAGNOSIS — I5043 Acute on chronic combined systolic (congestive) and diastolic (congestive) heart failure: Secondary | ICD-10-CM | POA: Diagnosis not present

## 2022-07-18 DIAGNOSIS — D631 Anemia in chronic kidney disease: Secondary | ICD-10-CM | POA: Diagnosis not present

## 2022-07-18 DIAGNOSIS — I130 Hypertensive heart and chronic kidney disease with heart failure and stage 1 through stage 4 chronic kidney disease, or unspecified chronic kidney disease: Secondary | ICD-10-CM | POA: Diagnosis not present

## 2022-07-18 DIAGNOSIS — N184 Chronic kidney disease, stage 4 (severe): Secondary | ICD-10-CM | POA: Diagnosis not present

## 2022-07-18 DIAGNOSIS — E1122 Type 2 diabetes mellitus with diabetic chronic kidney disease: Secondary | ICD-10-CM | POA: Diagnosis not present

## 2022-07-20 ENCOUNTER — Telehealth: Payer: Self-pay

## 2022-07-20 NOTE — Telephone Encounter (Signed)
[name] calls nurse line in regards to recent UA results.   He reports the home health nurse came out "sometime" last week to do a UA.   He reports he has not heard anything about results.   I did not see anything in PCP box. Will call [name] for more information.   Called [name]. She reports she will fax over latest UA/Culture results.

## 2022-07-23 DIAGNOSIS — R338 Other retention of urine: Secondary | ICD-10-CM | POA: Diagnosis not present

## 2022-07-23 NOTE — Telephone Encounter (Signed)
Nothing in [employer] box as of Monday afternoon

## 2022-07-24 DIAGNOSIS — I130 Hypertensive heart and chronic kidney disease with heart failure and stage 1 through stage 4 chronic kidney disease, or unspecified chronic kidney disease: Secondary | ICD-10-CM | POA: Diagnosis not present

## 2022-07-24 DIAGNOSIS — N184 Chronic kidney disease, stage 4 (severe): Secondary | ICD-10-CM | POA: Diagnosis not present

## 2022-07-24 DIAGNOSIS — D631 Anemia in chronic kidney disease: Secondary | ICD-10-CM | POA: Diagnosis not present

## 2022-07-24 DIAGNOSIS — I5043 Acute on chronic combined systolic (congestive) and diastolic (congestive) heart failure: Secondary | ICD-10-CM | POA: Diagnosis not present

## 2022-07-24 DIAGNOSIS — E1122 Type 2 diabetes mellitus with diabetic chronic kidney disease: Secondary | ICD-10-CM | POA: Diagnosis not present

## 2022-07-24 NOTE — Progress Notes (Signed)
Remote pacemaker transmission.   

## 2022-07-24 NOTE — Telephone Encounter (Signed)
Yes you can give the verbal order to replace cathether  Thanks very much for all the working checking with Urology

## 2022-07-24 NOTE — Telephone Encounter (Signed)
Returned call to [place] and provided with verbal order to replace catheter.   Veronda Prude, RN

## 2022-07-24 NOTE — Telephone Encounter (Signed)
Received call from Byrd Hesselbach, RN at [facility] regarding patient.   Catheter was removed by Urology office Sande Brothers) yesterday per patient request. Since then, patient has been having issues with constant urination and burning with urination.   Home Health RN is requesting verbal orders to replace foley catheter and obtain a UA and culture.   [name], patient's son, reports that Dr. Sande Brothers already sent the order to [facility] to replace the catheter. [name] is requesting verbal from our office.   I called and verified with Triage nurse at [facility] that Dr. Sande Brothers is okay with replacing catheter.   Please advise if verbal orders can be provided by our office for catheter replacement, UA and culture.   Contact for Byrd Hesselbach is [phone].  Veronda Prude, RN

## 2022-07-25 ENCOUNTER — Encounter: Payer: Self-pay | Admitting: Family Medicine

## 2022-07-25 ENCOUNTER — Telehealth: Payer: Self-pay

## 2022-07-25 NOTE — Telephone Encounter (Signed)
Pt calling asking for [place] to call [name] and give verbal orders to continue [facility]. Please advise if this is appropriate. Sunday Spillers, CMA

## 2022-07-25 NOTE — Telephone Encounter (Signed)
Please give verbal orders to continue [facility]  Terisa Starr, MD  [facility] Medicine Teaching Service

## 2022-07-26 DIAGNOSIS — N184 Chronic kidney disease, stage 4 (severe): Secondary | ICD-10-CM | POA: Diagnosis not present

## 2022-07-26 DIAGNOSIS — D631 Anemia in chronic kidney disease: Secondary | ICD-10-CM | POA: Diagnosis not present

## 2022-07-26 DIAGNOSIS — E1122 Type 2 diabetes mellitus with diabetic chronic kidney disease: Secondary | ICD-10-CM | POA: Diagnosis not present

## 2022-07-26 DIAGNOSIS — I5043 Acute on chronic combined systolic (congestive) and diastolic (congestive) heart failure: Secondary | ICD-10-CM | POA: Diagnosis not present

## 2022-07-26 DIAGNOSIS — I130 Hypertensive heart and chronic kidney disease with heart failure and stage 1 through stage 4 chronic kidney disease, or unspecified chronic kidney disease: Secondary | ICD-10-CM | POA: Diagnosis not present

## 2022-07-26 NOTE — Telephone Encounter (Signed)
Spoke with Curley Spice) from [place] she explained once patient is seen next Tuesday for her cathter input the patient will be given a schedule for when her HH people when they come by her house. Patient's son Ferne Reus) has been updated but he said that [name] does not keep up with things therefore patient's cathter has already been put in. [name] stated a new order must be put in for the home health nurse do to it expiring. Penni Bombard CMA

## 2022-07-26 NOTE — Telephone Encounter (Signed)
See separate telephone encounter.

## 2022-07-27 ENCOUNTER — Encounter: Payer: Self-pay | Admitting: Family Medicine

## 2022-07-28 DIAGNOSIS — I130 Hypertensive heart and chronic kidney disease with heart failure and stage 1 through stage 4 chronic kidney disease, or unspecified chronic kidney disease: Secondary | ICD-10-CM | POA: Diagnosis not present

## 2022-07-28 DIAGNOSIS — N184 Chronic kidney disease, stage 4 (severe): Secondary | ICD-10-CM | POA: Diagnosis not present

## 2022-07-28 DIAGNOSIS — I5043 Acute on chronic combined systolic (congestive) and diastolic (congestive) heart failure: Secondary | ICD-10-CM | POA: Diagnosis not present

## 2022-07-28 DIAGNOSIS — E1122 Type 2 diabetes mellitus with diabetic chronic kidney disease: Secondary | ICD-10-CM | POA: Diagnosis not present

## 2022-07-28 DIAGNOSIS — D631 Anemia in chronic kidney disease: Secondary | ICD-10-CM | POA: Diagnosis not present

## 2022-07-29 NOTE — Telephone Encounter (Signed)
Please clarify--does [name] need a new verbal order for a catheter?  Terisa Starr, MD  Family Medicine Teaching Service

## 2022-07-30 ENCOUNTER — Encounter: Payer: Self-pay | Admitting: Cardiovascular Disease

## 2022-07-30 ENCOUNTER — Telehealth: Payer: Self-pay | Admitting: Cardiovascular Disease

## 2022-07-30 ENCOUNTER — Ambulatory Visit: Payer: 59 | Admitting: Neurology

## 2022-07-30 DIAGNOSIS — D631 Anemia in chronic kidney disease: Secondary | ICD-10-CM | POA: Diagnosis not present

## 2022-07-30 DIAGNOSIS — I10 Essential (primary) hypertension: Secondary | ICD-10-CM

## 2022-07-30 DIAGNOSIS — I130 Hypertensive heart and chronic kidney disease with heart failure and stage 1 through stage 4 chronic kidney disease, or unspecified chronic kidney disease: Secondary | ICD-10-CM | POA: Diagnosis not present

## 2022-07-30 DIAGNOSIS — N184 Chronic kidney disease, stage 4 (severe): Secondary | ICD-10-CM | POA: Diagnosis not present

## 2022-07-30 DIAGNOSIS — E1122 Type 2 diabetes mellitus with diabetic chronic kidney disease: Secondary | ICD-10-CM | POA: Diagnosis not present

## 2022-07-30 DIAGNOSIS — I5043 Acute on chronic combined systolic (congestive) and diastolic (congestive) heart failure: Secondary | ICD-10-CM | POA: Diagnosis not present

## 2022-07-30 DIAGNOSIS — I5033 Acute on chronic diastolic (congestive) heart failure: Secondary | ICD-10-CM

## 2022-07-30 MED ORDER — LOSARTAN POTASSIUM 25 MG PO TABS: 25.0000 mg | ORAL_TABLET | Freq: Every day | ORAL | 3 refills | Status: DC

## 2022-07-30 NOTE — Addendum Note (Signed)
Addended by: Scheryl Marten on: 07/30/2022 06:37 PM   Modules accepted: Orders

## 2022-07-30 NOTE — Telephone Encounter (Signed)
Spoke to the patient son [name], [place], he expressed concerns of pt blood pressure. [name] contact after-hours service on 6/26 pertaining to the pt elevated bp readings. Pt son was advised:   I recommend that you increase metoprolol to tartrate to 100 mg 3 times a day rather than twice a day.  She has a pacemaker.  I would prefer her to be seen in the office, however her son says she is immobile at this time and cannot come to the office anytime soon.  I asked her son to keep a blood pressure diary with 2 readings per day for the next week and forward it to [place] via MyChart.   Pt son his mother is non-mobile however, she is still experiencing dizziness and lightheadedness. Pt son stated he is currently at work and  will send pt blood pressure readings via MyChart.  Will forward to MD and nurse.

## 2022-07-30 NOTE — Telephone Encounter (Signed)
We did receive the download from her pacemaker on June 26 and everything looked okay. While Abigail Wallace's blood pressure is indeed a little too high, I would not expect her to have dizziness or lightheadedness from those readings. Please continue the metoprolol 100 mg 3 times a day and the amlodipine 10 mg daily. Add losartan 25 mg once daily please.

## 2022-07-30 NOTE — Telephone Encounter (Signed)
Judeth Cornfield [facility] RN calls nurse line to give update on catheter changes.   She reports after assessment re-cert today the plan of care is as follows.   1x a week every other week for one month.  Then they will decrease to once monthly catheter changes.   Will forward to PCP.

## 2022-07-30 NOTE — Telephone Encounter (Signed)
Spoke with [facility] RN on Friday.   She reported that she has orders to go and see patient this week.

## 2022-07-30 NOTE — Telephone Encounter (Signed)
Pt c/o BP issue: STAT if pt c/o blurred vision, one-sided weakness or slurred speech  1. What are your last 5 BP readings? No  2. Are you having any other symptoms (ex. Dizziness, headache, blurred vision, passed out)? Blurred vision   3. What is your BP issue? Pt states she had a visit where they supposed to charge on her battery for her heart but she states that she thinks it did not work and she says they did not charge it. Please advise.

## 2022-07-31 ENCOUNTER — Other Ambulatory Visit: Payer: Self-pay | Admitting: Cardiovascular Disease

## 2022-07-31 NOTE — Telephone Encounter (Signed)
Sounds good--let me know if Judeth Cornfield needs me to call. Terisa Starr, MD  Family Medicine Teaching Service

## 2022-08-01 ENCOUNTER — Other Ambulatory Visit: Payer: Self-pay

## 2022-08-01 DIAGNOSIS — L304 Erythema intertrigo: Secondary | ICD-10-CM

## 2022-08-01 MED ORDER — NYSTATIN 100000 UNIT/GM EX POWD: 1.0000 | Freq: Three times a day (TID) | CUTANEOUS | 1 refills | Status: DC

## 2022-08-02 ENCOUNTER — Ambulatory Visit (HOSPITAL_COMMUNITY): Payer: 59

## 2022-08-02 ENCOUNTER — Telehealth: Payer: Self-pay | Admitting: Cardiovascular Disease

## 2022-08-02 NOTE — Addendum Note (Signed)
Addended by: Candie Chroman on: 08/02/2022 10:41 AM   Modules accepted: Orders

## 2022-08-02 NOTE — Telephone Encounter (Signed)
Son called regarding Losartan.  Spoke with son, and advised same information as in the My chart message.  He states understanding at this time.  He will start the medication.  He will have HH draw the lab  in a month

## 2022-08-02 NOTE — Telephone Encounter (Signed)
I think we should. The kidney function has been very stable recently and losartan is actually protective in the long run. It is also heart protective for heart failure. She has not had problems with high potasium. We will check labs after about a month - please order a BMET

## 2022-08-02 NOTE — Telephone Encounter (Signed)
Pt c/o medication issue:  1. Name of Medication: Potassium   2. How are you currently taking this medication (dosage and times per day)?   3. Are you having a reaction (difficulty breathing--STAT)?   4. What is your medication issue? Patient is requesting call back to make sure this medication should be started. She would like a call back to discuss.

## 2022-08-02 NOTE — Telephone Encounter (Signed)
Called pt's caregiver to inform them that pt does not take nitroglycerin anymore, this medication was D/C on 03/05/2022, when pt was in the hospital. I advised the pt's giver to call back and speak with the doctor's nurse concerning this matter. The caregiver stated that he would. Verbalized understanding.

## 2022-08-02 NOTE — Telephone Encounter (Signed)
*  STAT* If patient is at the pharmacy, call can be transferred to refill team.   1. Which medications need to be refilled? (please list name of each medication and dose if known) Nitroglycerin 0.4 MG   2. Would you like to learn more about the convenience, safety, & potential cost savings by using the [facility]? No   3. Are you open to using the [facility] (Type [facility] ). No   4. Which pharmacy/location (including street and city if local pharmacy) is medication to be sent to? [facility] - [place], [place] - [address]     5. Do they need a 30 day or 90 day supply? 30 day

## 2022-08-03 ENCOUNTER — Encounter: Payer: Self-pay | Admitting: Cardiovascular Disease

## 2022-08-03 ENCOUNTER — Telehealth: Payer: Self-pay | Admitting: Cardiovascular Disease

## 2022-08-03 ENCOUNTER — Other Ambulatory Visit: Payer: Self-pay

## 2022-08-03 ENCOUNTER — Encounter: Payer: Self-pay | Admitting: Family Medicine

## 2022-08-03 DIAGNOSIS — J4489 Other specified chronic obstructive pulmonary disease: Secondary | ICD-10-CM

## 2022-08-03 MED ORDER — NITROGLYCERIN 0.4 MG SL SUBL: 0.4000 mg | SUBLINGUAL_TABLET | SUBLINGUAL | 6 refills | Status: AC | PRN

## 2022-08-03 NOTE — Telephone Encounter (Signed)
Pt's son calling to ask about the  Nitroglycerin. Please advise.

## 2022-08-03 NOTE — Telephone Encounter (Signed)
Ok to refill the NTG please

## 2022-08-03 NOTE — Telephone Encounter (Signed)
Returned call to son [name] to let him know that we're waiting to hear from [place] on the NTG. He states he's fine, just noticed it was out of date by 6 months and was wanting a fresh bottle.

## 2022-08-06 DIAGNOSIS — I130 Hypertensive heart and chronic kidney disease with heart failure and stage 1 through stage 4 chronic kidney disease, or unspecified chronic kidney disease: Secondary | ICD-10-CM | POA: Diagnosis not present

## 2022-08-06 DIAGNOSIS — Z466 Encounter for fitting and adjustment of urinary device: Secondary | ICD-10-CM | POA: Diagnosis not present

## 2022-08-06 DIAGNOSIS — I5043 Acute on chronic combined systolic (congestive) and diastolic (congestive) heart failure: Secondary | ICD-10-CM | POA: Diagnosis not present

## 2022-08-06 DIAGNOSIS — D631 Anemia in chronic kidney disease: Secondary | ICD-10-CM | POA: Diagnosis not present

## 2022-08-06 DIAGNOSIS — N184 Chronic kidney disease, stage 4 (severe): Secondary | ICD-10-CM | POA: Diagnosis not present

## 2022-08-06 DIAGNOSIS — E1122 Type 2 diabetes mellitus with diabetic chronic kidney disease: Secondary | ICD-10-CM | POA: Diagnosis not present

## 2022-08-06 NOTE — Telephone Encounter (Signed)
Community message sent to [employer]. Will await response.   Hannah C Pipkin, RN  

## 2022-08-06 NOTE — Telephone Encounter (Signed)
Receipt confirmed by [employer].   Hannah C Pipkin, RN  

## 2022-08-07 NOTE — Telephone Encounter (Addendum)
Updated [employer] with 3/22 addended note.

## 2022-08-08 ENCOUNTER — Encounter: Payer: Self-pay | Admitting: Family Medicine

## 2022-08-09 ENCOUNTER — Ambulatory Visit: Payer: Self-pay

## 2022-08-09 ENCOUNTER — Other Ambulatory Visit: Payer: Self-pay | Admitting: Family Medicine

## 2022-08-09 ENCOUNTER — Telehealth: Payer: Self-pay | Admitting: Family Medicine

## 2022-08-09 DIAGNOSIS — F200 Paranoid schizophrenia: Secondary | ICD-10-CM

## 2022-08-09 NOTE — Telephone Encounter (Signed)
Called son. Discussed concerns. Patient needs virtual visit for nebulizer approval. Scheduled with Dr. Sherrilee Gilles.    RN Team- please call [name] to request labs for new tremors - CBC with differential - CMP - TSH reflex - Lipid panel   Please also change tomorrows visit to virtual/mychart--Epic will not let me do it  Terisa Starr, MD  [facility] Medicine Teaching Service

## 2022-08-09 NOTE — Telephone Encounter (Signed)
Called [name] home health. Spoke with Lelon Mast and gave verbal orders for labs.   Changed office visit to mychart video visit per Dr. Manson Passey.   Veronda Prude, RN

## 2022-08-10 ENCOUNTER — Encounter: Payer: Self-pay | Admitting: Family Medicine

## 2022-08-10 ENCOUNTER — Encounter: Payer: Self-pay | Admitting: Cardiovascular Disease

## 2022-08-10 ENCOUNTER — Telehealth: Payer: 59

## 2022-08-10 MED ORDER — METOPROLOL TARTRATE 100 MG PO TABS: 100.0000 mg | ORAL_TABLET | Freq: Three times a day (TID) | ORAL | 3 refills | Status: DC | PRN

## 2022-08-10 MED ORDER — METOPROLOL TARTRATE 50 MG PO TABS: 100.0000 mg | ORAL_TABLET | Freq: Three times a day (TID) | ORAL | 3 refills | Status: DC | PRN

## 2022-08-14 DIAGNOSIS — N184 Chronic kidney disease, stage 4 (severe): Secondary | ICD-10-CM | POA: Diagnosis not present

## 2022-08-14 DIAGNOSIS — E1122 Type 2 diabetes mellitus with diabetic chronic kidney disease: Secondary | ICD-10-CM | POA: Diagnosis not present

## 2022-08-14 DIAGNOSIS — I5043 Acute on chronic combined systolic (congestive) and diastolic (congestive) heart failure: Secondary | ICD-10-CM | POA: Diagnosis not present

## 2022-08-14 DIAGNOSIS — D631 Anemia in chronic kidney disease: Secondary | ICD-10-CM | POA: Diagnosis not present

## 2022-08-14 DIAGNOSIS — I130 Hypertensive heart and chronic kidney disease with heart failure and stage 1 through stage 4 chronic kidney disease, or unspecified chronic kidney disease: Secondary | ICD-10-CM | POA: Diagnosis not present

## 2022-08-14 DIAGNOSIS — Z466 Encounter for fitting and adjustment of urinary device: Secondary | ICD-10-CM | POA: Diagnosis not present

## 2022-08-15 LAB — LAB REPORT - SCANNED: EGFR: 29

## 2022-08-17 ENCOUNTER — Telehealth: Payer: Self-pay | Admitting: Cardiovascular Disease

## 2022-08-17 NOTE — Telephone Encounter (Signed)
Losartan is a medication that is useful for treatment of congestive heart failure.  It helps conserve potassium in your system, which helps compensate for the excessive potassium loss caused by furosemide (the diuretic we are giving her to get rid of the swelling). As per the last check a few weeks ago her pacemaker still has roughly 3-1/2 years of battery life.

## 2022-08-17 NOTE — Telephone Encounter (Signed)
Pt c/o medication issue:  1. Name of Medication:   losartan (COZAAR) 25 MG tablet    2. How are you currently taking this medication (dosage and times per day)? Take 1 tablet (25 mg total) by mouth daily.   3. Are you having a reaction (difficulty breathing--STAT)? No  4. What is your medication issue? Pt states that she was wanting to make sure the above medication was for  High BP because she states she looked it up and it said it was for Low BP. Pt wants to make sure it is correct. Please advise.

## 2022-08-17 NOTE — Telephone Encounter (Signed)
Pt c/o medication issue:  1. Name of Medication:   Potassium  2. How are you currently taking this medication (dosage and times per day)?   3. Are you having a reaction (difficulty breathing--STAT)?   4. What is your medication issue?   Patient stated she has been taking potassium and wants to know if this will bring her BP down.

## 2022-08-17 NOTE — Telephone Encounter (Signed)
Returned pt call. Informed her that potassium relaxes the walls of the blood vessels which can lower the bp. She is on losartan and her bp are "running good". She thought this medication was to make her BP go up. Clarification given to pt and she verbalizes understanding. Asked pt to send in her BP readings and she states her son will take care of that as he is her caregiver. She wants to know when she is to have a new pacemaker put in as she has had this one for 12/13 years.. She also states Dr. Allyson Sabal told her that her current pacemaker would be charged and it never happened.

## 2022-08-20 ENCOUNTER — Telehealth: Payer: Self-pay

## 2022-08-20 DIAGNOSIS — G3184 Mild cognitive impairment, so stated: Secondary | ICD-10-CM | POA: Diagnosis not present

## 2022-08-20 DIAGNOSIS — I130 Hypertensive heart and chronic kidney disease with heart failure and stage 1 through stage 4 chronic kidney disease, or unspecified chronic kidney disease: Secondary | ICD-10-CM | POA: Diagnosis not present

## 2022-08-20 DIAGNOSIS — I5043 Acute on chronic combined systolic (congestive) and diastolic (congestive) heart failure: Secondary | ICD-10-CM | POA: Diagnosis not present

## 2022-08-20 DIAGNOSIS — D631 Anemia in chronic kidney disease: Secondary | ICD-10-CM | POA: Diagnosis not present

## 2022-08-20 DIAGNOSIS — E1122 Type 2 diabetes mellitus with diabetic chronic kidney disease: Secondary | ICD-10-CM | POA: Diagnosis not present

## 2022-08-20 DIAGNOSIS — Z466 Encounter for fitting and adjustment of urinary device: Secondary | ICD-10-CM | POA: Diagnosis not present

## 2022-08-20 DIAGNOSIS — N184 Chronic kidney disease, stage 4 (severe): Secondary | ICD-10-CM | POA: Diagnosis not present

## 2022-08-20 DIAGNOSIS — R451 Restlessness and agitation: Secondary | ICD-10-CM | POA: Diagnosis not present

## 2022-08-20 NOTE — Telephone Encounter (Signed)
Patient LVM on nurse line requesting to speak with Dr. Manson Passey regarding results from blood work drawn from [place].   Patient states that she has not heard anything about results yet.   Patient is also asking if provider can order a urine culture.   Attempted to return call to patient to determine what symptoms she may be experiencing.   She did not answer, LVM asking that she call back to office.   Veronda Prude, RN

## 2022-08-21 ENCOUNTER — Encounter: Payer: Self-pay | Admitting: Family Medicine

## 2022-08-21 NOTE — Telephone Encounter (Signed)
Called patient. Foley changed yesterday.  Reviewed labs. Reports some dysuria. No fevers back pain or change in appetite.  Given symptoms will collect urine.   Nursing- please call [name] and collect urine culture this week. Foley just changed so does not need changed.   Terisa Starr, MD  Family Medicine Teaching Service

## 2022-08-21 NOTE — Telephone Encounter (Signed)
Called and spoke with [name]. They report they already received order for urine culture from Dr. Liliane Shi.   Asked that they also fax our office with results of culture.   Veronda Prude, RN

## 2022-08-22 ENCOUNTER — Telehealth: Payer: Self-pay | Admitting: Family Medicine

## 2022-08-22 NOTE — Telephone Encounter (Signed)
Called Mr. Abigail Wallace. Discussed results. Requests another call as able 8/8.  Terisa Starr, MD  Family Medicine Teaching Service

## 2022-08-23 ENCOUNTER — Telehealth: Payer: Self-pay | Admitting: Family Medicine

## 2022-08-23 NOTE — Telephone Encounter (Signed)
Called son. Discussed labs. All questions answered.   Terisa Starr, MD  Family Medicine Teaching Service

## 2022-08-28 ENCOUNTER — Ambulatory Visit: Payer: 59 | Admitting: Neurology

## 2022-08-29 ENCOUNTER — Telehealth: Payer: Self-pay | Admitting: Family Medicine

## 2022-08-29 ENCOUNTER — Ambulatory Visit: Payer: 59 | Admitting: Podiatry

## 2022-08-29 NOTE — Telephone Encounter (Signed)
Patients son Abigail Wallace) dropped off form at front desk for Home Nurse.  Verified that patient section of form has been completed.  Last DOS/WCC with PCP was 06/25/2022.  Placed form in Red team folder to be completed by clinical staff.  [name]

## 2022-08-30 NOTE — Telephone Encounter (Signed)
Form mailed to address on form per Rons request.

## 2022-08-30 NOTE — Telephone Encounter (Signed)
Called [name] to discuss form.   He reports he will call me back around 2 to coordinate pick up or possibly mail.   Will await call.

## 2022-08-30 NOTE — Telephone Encounter (Signed)
Reviewed, completed, and signed form.  Note routed to RN team inbasket and placed completed form in RN Wall pocket in the front office.  Carina M Brown, MD  

## 2022-09-03 DIAGNOSIS — J811 Chronic pulmonary edema: Secondary | ICD-10-CM | POA: Diagnosis not present

## 2022-09-04 ENCOUNTER — Telehealth: Payer: Self-pay | Admitting: Cardiovascular Disease

## 2022-09-04 NOTE — Telephone Encounter (Signed)
Pt c/o BP issue: STAT if pt c/o blurred vision, one-sided weakness or slurred speech  1. What are your last 5 BP readings?   3:20 pm today - 169/93 2:00 pm           - 191/93 7:00 am           - 153/78 9:00 pm yesterday - 147/80 7:00 pm                  - 159/82  2. Are you having any other symptoms (ex. Dizziness, headache, blurred vision, passed out)?   Not increased  3. What is your BP issue?    Son stated he is concerned patient's BP reading have been trending high and patient has been taking her meds at the same time every day.

## 2022-09-04 NOTE — Telephone Encounter (Signed)
Patient 7 am BP was before medication 153/78.   191/93 at 2 pm was before meds.  The 169/93 was 1-1/2 hours after the medication   Amlodipine 10 mg Daily 7 am Losartan 25 mg Daily Takes at 2 pm  Lopressor 100 mg TID (avg 7, 2, & 9 pm)  Last night he gave BP meds  9 pm and she had to get up to bathroom at 1 am and BP was 159/83.  Son states it has been trending up.   8/19  6 am Before meds 153/87  No Bp after meds           2pm 163/93 before meds   No BP after meds          8pm 141/65 before  meds  No Bp meds  He states she has a constant dizzy and bad vision so it does not seem to be worse.  Advised to take BP before and 2 hours after each medication time (3x a day) Advised will send to provider for further recommendations.

## 2022-09-05 ENCOUNTER — Telehealth: Payer: Self-pay | Admitting: Student

## 2022-09-05 ENCOUNTER — Encounter: Payer: Self-pay | Admitting: Family Medicine

## 2022-09-05 DIAGNOSIS — I130 Hypertensive heart and chronic kidney disease with heart failure and stage 1 through stage 4 chronic kidney disease, or unspecified chronic kidney disease: Secondary | ICD-10-CM | POA: Diagnosis not present

## 2022-09-05 DIAGNOSIS — E1122 Type 2 diabetes mellitus with diabetic chronic kidney disease: Secondary | ICD-10-CM | POA: Diagnosis not present

## 2022-09-05 DIAGNOSIS — D631 Anemia in chronic kidney disease: Secondary | ICD-10-CM | POA: Diagnosis not present

## 2022-09-05 DIAGNOSIS — N184 Chronic kidney disease, stage 4 (severe): Secondary | ICD-10-CM | POA: Diagnosis not present

## 2022-09-05 DIAGNOSIS — Z466 Encounter for fitting and adjustment of urinary device: Secondary | ICD-10-CM | POA: Diagnosis not present

## 2022-09-05 DIAGNOSIS — I5043 Acute on chronic combined systolic (congestive) and diastolic (congestive) heart failure: Secondary | ICD-10-CM | POA: Diagnosis not present

## 2022-09-05 MED ORDER — LOSARTAN POTASSIUM 50 MG PO TABS: 50.0000 mg | ORAL_TABLET | Freq: Every day | ORAL | 3 refills | Status: DC

## 2022-09-05 NOTE — Telephone Encounter (Signed)
Called son and LM to call to office for medication change

## 2022-09-05 NOTE — Telephone Encounter (Signed)
Patients son [name] calls the after-hours line to report hypoxia and altered mental status in his mother. Tells me that her BP has been running high for the past two days or so and then she started to report increased WOB overnight and he found her to be hypoxic to 84% this morning. He is giving her a Duoneb treatment and has placed her on her home oxygen but she remains with increased WOB and remains "delirious." His mother has significant anxiety and a history of paranoid schizophrenia, the decision to come to the ED is not one they take lightly as she finds ambulance transport traumatizing. Advised that with her hypoxia, persistent shortness of breath, and now AMS, she would benefit from emergent evaluation here at the hospital. She has a history of CHF, Afib, COPD, CAD, and it would be impossible to determine the cause of status change over the phone. He plans to call for EMS transport.  FMTS will monitor her clinical course and would be happy to admit should ED Provider find admission warranted.  [name] asks that I be sure that PCP Dr. Manson Passey is aware of these occurrences. Will route note to PCP.   Eliezer Mccoy, MD

## 2022-09-05 NOTE — Telephone Encounter (Signed)
Please increase the losartan to '50mg'$  daily

## 2022-09-05 NOTE — Telephone Encounter (Signed)
Call to son and gave information.  Will update RX and send to pharmacy. States HH took it today and was elevated. Advised again to increase med and keep a log.  Asked for suggestions regarding her worry and or anxiety but rejected all ideas so advised to call PCP

## 2022-09-11 ENCOUNTER — Other Ambulatory Visit (INDEPENDENT_AMBULATORY_CARE_PROVIDER_SITE_OTHER): Payer: 59 | Admitting: Family Medicine

## 2022-09-11 ENCOUNTER — Encounter: Payer: Self-pay | Admitting: Family Medicine

## 2022-09-11 VITALS — BP 152/80 | HR 91

## 2022-09-11 DIAGNOSIS — D329 Benign neoplasm of meninges, unspecified: Secondary | ICD-10-CM

## 2022-09-11 DIAGNOSIS — I10 Essential (primary) hypertension: Secondary | ICD-10-CM | POA: Diagnosis not present

## 2022-09-11 DIAGNOSIS — I495 Sick sinus syndrome: Secondary | ICD-10-CM

## 2022-09-11 DIAGNOSIS — K5904 Chronic idiopathic constipation: Secondary | ICD-10-CM | POA: Diagnosis not present

## 2022-09-11 DIAGNOSIS — B91 Sequelae of poliomyelitis: Secondary | ICD-10-CM

## 2022-09-11 DIAGNOSIS — F200 Paranoid schizophrenia: Secondary | ICD-10-CM | POA: Diagnosis not present

## 2022-09-11 DIAGNOSIS — R918 Other nonspecific abnormal finding of lung field: Secondary | ICD-10-CM

## 2022-09-11 DIAGNOSIS — M1A9XX0 Chronic gout, unspecified, without tophus (tophi): Secondary | ICD-10-CM | POA: Diagnosis not present

## 2022-09-11 DIAGNOSIS — M6281 Muscle weakness (generalized): Secondary | ICD-10-CM

## 2022-09-11 DIAGNOSIS — N1832 Chronic kidney disease, stage 3b: Secondary | ICD-10-CM | POA: Diagnosis not present

## 2022-09-11 MED ORDER — LACTULOSE 10 GM/15ML PO SOLN
10.0000 g | Freq: Two times a day (BID) | ORAL | 1 refills | Status: DC | PRN
Start: 2022-09-11 — End: 2023-06-15

## 2022-09-11 MED ORDER — DOXYCYCLINE HYCLATE 100 MG PO TABS: 100.0000 mg | ORAL_TABLET | Freq: Two times a day (BID) | ORAL | 0 refills | Status: AC

## 2022-09-11 NOTE — Assessment & Plan Note (Signed)
Worsening in setting of DJD and OA Will request [name] PT

## 2022-09-11 NOTE — Assessment & Plan Note (Signed)
Has follow-up CT scheduled

## 2022-09-11 NOTE — Progress Notes (Addendum)
    SUBJECTIVE:   CHIEF COMPLAINT: medication issue  HPI:  Location: Patient's Home due to difficulty with ambulation  Soila Ippoliti Loleta Chance is a 77 y.o.  with history notable for atrial fibrillation, obesity, COPD, HF EF 45-50% with  presenting for home visit and concerns about medications.  Diabetes The patient takes Trajenta. BG in low 100s in AM. She is concerned about weight gain. She has been less active since her winter hospital stay. She refuses to transfer and spends most of her time in her bed.  Deconditioning Patient has not been ambulatory since discharge from hospital in February. No falls. Has some intermittent R thigh pain improved with Tylenol and Voltaren. No rash or injury. Has a history of DJD of spine and OA of hip. Interested in home PT from [place] ([place]   HTN She believes losartan is causing her BP to be higher. Stopped this about 2 weeks ago. Metoprolol BID and amlodipine 10 mg daily are current regimen. She is hesitant to take Metoprolol TID.   Pacemaker Concerned about pacemaker battery as she was told it lasts 11 years. She is now in permanent atrial fibrillation and has years of battery life left.   Cough and Congestion  Reports 2-3 weeks of cough and congestion. No fevers, dyspnea, chest pain. Does not like her inhalers. She is using her nebulizer as prescribed. Has had poor reaction to steroids in past.   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 Discussed at length. Patient amenable to current regimen. Son to monitor home blood  pressure. Will work with patient and family to optimize regimen as she will allow.   Sick sinus syndrome [facility]) Discussed pacemaker battery life   Paranoid schizophrenia (HCC) Follows with Psychiatry Due for labs in September, will order monitoring labs   Gout Discussed dietary changes  Will send information via MyChart   CKD stage IIIb Discussed taking losartan, will continue to address   Meningioma [facility]) Has follow up with Neurology   Post-polio muscle weakness Worsening in setting of DJD and OA Will request [name] PT   Pulmonary nodules Has follow up CT scheduled   COPD Discussed using Trelegy  Suspect exacerbation of COPD as cause of congestion, sputum production CT is upcoming, will see if ongoing pneumonia which could be cause  Unlikely PE or HF exacerbation given symptoms Will treat with doxycycline for now, monitor closely, may need to increase Lasix Will hold off on prednisone given side effects in past Due to poor inspiration at times due to behaviors and significant lung disease the patient benefits from nebulizer therapy Rx new nebulizer with Duoneb solution   Chronic constipation In part due to reduced ambulation Change Miralax to lactulose (patient concerned about miralax overdose)   For [facility] - CBC, CMP, A1C, BNP, Urine Culture next week - Home Health PT   Will send information about uric acid     Terisa Starr, MD  Family Medicine Teaching Service  [facility]

## 2022-09-11 NOTE — Assessment & Plan Note (Signed)
Has follow up with Neurology

## 2022-09-11 NOTE — Assessment & Plan Note (Signed)
Not at goal. Discussed at length. Patient amenable to current regimen. Son to monitor home blood pressure. Will work with patient and family to optimize regimen as she will allow.

## 2022-09-11 NOTE — Assessment & Plan Note (Signed)
Discussed taking losartan, will continue to address

## 2022-09-11 NOTE — Assessment & Plan Note (Signed)
Follows with Psychiatry Due for labs in September, will order monitoring labs

## 2022-09-11 NOTE — Assessment & Plan Note (Signed)
Discussed pacemaker battery life

## 2022-09-11 NOTE — Assessment & Plan Note (Signed)
Discussed dietary changes  Will send information via MyChart

## 2022-09-12 ENCOUNTER — Encounter: Payer: Self-pay | Admitting: Family Medicine

## 2022-09-12 ENCOUNTER — Telehealth: Payer: Self-pay | Admitting: Family Medicine

## 2022-09-12 DIAGNOSIS — J4489 Other specified chronic obstructive pulmonary disease: Secondary | ICD-10-CM

## 2022-09-12 DIAGNOSIS — B91 Sequelae of poliomyelitis: Secondary | ICD-10-CM

## 2022-09-12 DIAGNOSIS — Z978 Presence of other specified devices: Secondary | ICD-10-CM

## 2022-09-12 DIAGNOSIS — N1832 Chronic kidney disease, stage 3b: Secondary | ICD-10-CM

## 2022-09-12 NOTE — Telephone Encounter (Signed)
Called [name] and provided verbal orders.   Community message sent to [employer] for nebulizer machine.   Veronda Prude, RN

## 2022-09-12 NOTE — Telephone Encounter (Signed)
Patient has need for DME. I have ordered nebulizer machine and mask . I am routing note for [facility] RN Pool.   Also ordered PT and renewed HH. Patient with several acute issues (See note 8/27). Please call [name] - Please ask them to renew PT order--patient wants PT named Ligi - Needs the following  next week Catheter exchange Urine culture off NEW catheter CBC CMP A1C  BNP  Please let me know if there are questions   Westley Chandler, MD

## 2022-09-13 ENCOUNTER — Ambulatory Visit (HOSPITAL_COMMUNITY): Payer: 59 | Attending: Family Medicine

## 2022-09-13 ENCOUNTER — Telehealth: Payer: Self-pay

## 2022-09-13 MED ORDER — TRELEGY ELLIPTA 100-62.5-25 MCG/ACT IN AEPB: 1.0000 | INHALATION_SPRAY | Freq: Every day | RESPIRATORY_TRACT | 3 refills | Status: DC

## 2022-09-13 MED ORDER — IPRATROPIUM-ALBUTEROL 0.5-2.5 (3) MG/3ML IN SOLN: 3.0000 mL | RESPIRATORY_TRACT | 3 refills | Status: DC | PRN

## 2022-09-13 NOTE — Telephone Encounter (Signed)
Both mediations sent to pharmacy.  Terisa Starr, MD  Family Medicine Teaching Service

## 2022-09-13 NOTE — Telephone Encounter (Signed)
Received orders to sign. Please let [name] know I want a BMP and BNP. Let me know if that is confusing to them  Thanks CB

## 2022-09-13 NOTE — Telephone Encounter (Signed)
Abigail Wallace LVM on nurse line in regards to two medications.   He reports at recent home visit Abigail Wallace mentioned starting her back on Trelegy, however reports this was not at the pharmacy.   He is also requesting a refill on nebulizer solution.   Will forward to PCP for advisement.

## 2022-09-18 DIAGNOSIS — N184 Chronic kidney disease, stage 4 (severe): Secondary | ICD-10-CM | POA: Diagnosis not present

## 2022-09-18 DIAGNOSIS — I5043 Acute on chronic combined systolic (congestive) and diastolic (congestive) heart failure: Secondary | ICD-10-CM | POA: Diagnosis not present

## 2022-09-18 DIAGNOSIS — E1122 Type 2 diabetes mellitus with diabetic chronic kidney disease: Secondary | ICD-10-CM | POA: Diagnosis not present

## 2022-09-18 DIAGNOSIS — D631 Anemia in chronic kidney disease: Secondary | ICD-10-CM | POA: Diagnosis not present

## 2022-09-18 DIAGNOSIS — Z466 Encounter for fitting and adjustment of urinary device: Secondary | ICD-10-CM | POA: Diagnosis not present

## 2022-09-18 DIAGNOSIS — I130 Hypertensive heart and chronic kidney disease with heart failure and stage 1 through stage 4 chronic kidney disease, or unspecified chronic kidney disease: Secondary | ICD-10-CM | POA: Diagnosis not present

## 2022-09-18 NOTE — Telephone Encounter (Signed)
Called [name] and provided update/clarification.   Veronda Prude, RN

## 2022-09-18 NOTE — Telephone Encounter (Signed)
Called patient's son, Ferne Reus.   He reports that he has been giving 20 mg Lasix for the past three weeks, instead of 40 mg.   No shortness of breath.   He has been monitoring vitals, BP is doing better. He does report changes in her swelling. States that on left wrist- 1.5 cm and left ankle is 2 cm increased swelling from right side.   He also reports that yesterday patient had decreased urine output. He states that she had approx 1200 ml less that her intake.   Also reports very dark urine. [name] is supposed to come out within the next few days to replace urine, obtain UA and draw labs.   Son is asking if he should give patient an extra dose of 20 mg Lasix tonight or if he should wait to start the 40 mg until Thursday.   Please advise.   Veronda Prude, RN

## 2022-09-18 NOTE — Telephone Encounter (Signed)
Please advise that he should  Encourage drinking water Monitor for symptoms of catheter obstruction (abdominal pain) Stick to usual Lasix schedule with 40 mg on Thursday Terisa Starr, MD  [facility] Medicine Teaching Service

## 2022-09-20 ENCOUNTER — Encounter: Payer: Self-pay | Admitting: Family Medicine

## 2022-09-20 ENCOUNTER — Telehealth: Payer: Self-pay

## 2022-09-20 DIAGNOSIS — D631 Anemia in chronic kidney disease: Secondary | ICD-10-CM | POA: Diagnosis not present

## 2022-09-20 DIAGNOSIS — N184 Chronic kidney disease, stage 4 (severe): Secondary | ICD-10-CM | POA: Diagnosis not present

## 2022-09-20 DIAGNOSIS — J449 Chronic obstructive pulmonary disease, unspecified: Secondary | ICD-10-CM

## 2022-09-20 DIAGNOSIS — I130 Hypertensive heart and chronic kidney disease with heart failure and stage 1 through stage 4 chronic kidney disease, or unspecified chronic kidney disease: Secondary | ICD-10-CM | POA: Diagnosis not present

## 2022-09-20 DIAGNOSIS — N390 Urinary tract infection, site not specified: Secondary | ICD-10-CM | POA: Diagnosis not present

## 2022-09-20 DIAGNOSIS — E1122 Type 2 diabetes mellitus with diabetic chronic kidney disease: Secondary | ICD-10-CM | POA: Diagnosis not present

## 2022-09-20 DIAGNOSIS — Z466 Encounter for fitting and adjustment of urinary device: Secondary | ICD-10-CM | POA: Diagnosis not present

## 2022-09-20 DIAGNOSIS — I5043 Acute on chronic combined systolic (congestive) and diastolic (congestive) heart failure: Secondary | ICD-10-CM | POA: Diagnosis not present

## 2022-09-20 MED ORDER — ALBUTEROL SULFATE HFA 108 (90 BASE) MCG/ACT IN AERS
2.0000 | INHALATION_SPRAY | Freq: Four times a day (QID) | RESPIRATORY_TRACT | 3 refills | Status: DC | PRN
Start: 2022-09-20 — End: 2023-02-06

## 2022-09-20 NOTE — Telephone Encounter (Addendum)
Spoke with [employer].  Advised on urine culture and UA off of new catheter.   She reports she is going today around noon for home visit.

## 2022-09-20 NOTE — Telephone Encounter (Signed)
Please clarify with [name] - I want a Urine CULTURE (okay to get UA if they want) - Can be drawn off NEW catheter once placed or if they like to do I/O that is fine too (IDSA recommends drawing off new catheter)  Terisa Starr, MD  [facility] Medicine Teaching Service

## 2022-09-20 NOTE — Telephone Encounter (Signed)
[facility] PT calls nurse line requesting verbal orders for [facility] PT as follows.    1x a week for 1 week 2x a week for 3 weeks 1x a week for 3 weeks   Verbal order given per [facility] protocol.

## 2022-09-24 NOTE — Telephone Encounter (Signed)
Patient's son returns call to nurse line regarding lab results.   He wants to make sure that labs did not show any signs of infection.  He is asking if we have the results back from kidney function. Advised that per note from this AM, this test was still pending, however, I would reach out to provider for possible update. Also advised that these can take several days to return. Checked provider's box, no new faxes.  *Informed front office that we are waiting on fax, they will let me know once fax is received.   Will forward to PCP.   Veronda Prude, RN

## 2022-09-25 DIAGNOSIS — J449 Chronic obstructive pulmonary disease, unspecified: Secondary | ICD-10-CM | POA: Diagnosis not present

## 2022-09-26 ENCOUNTER — Ambulatory Visit (INDEPENDENT_AMBULATORY_CARE_PROVIDER_SITE_OTHER): Payer: 59 | Admitting: Neurology

## 2022-09-26 ENCOUNTER — Encounter: Payer: Self-pay | Admitting: Neurology

## 2022-09-26 ENCOUNTER — Telehealth: Payer: Self-pay | Admitting: Family Medicine

## 2022-09-26 VITALS — BP 171/72 | HR 73 | Ht 66.0 in

## 2022-09-26 DIAGNOSIS — R4189 Other symptoms and signs involving cognitive functions and awareness: Secondary | ICD-10-CM

## 2022-09-26 DIAGNOSIS — E1142 Type 2 diabetes mellitus with diabetic polyneuropathy: Secondary | ICD-10-CM | POA: Diagnosis not present

## 2022-09-26 DIAGNOSIS — M21372 Foot drop, left foot: Secondary | ICD-10-CM

## 2022-09-26 MED ORDER — AMOXICILLIN 500 MG PO CAPS: 500.0000 mg | ORAL_CAPSULE | Freq: Two times a day (BID) | ORAL | 0 refills | Status: DC

## 2022-09-26 NOTE — Progress Notes (Unsigned)
Follow-up Visit   Date: 09/26/2022    Abigail Wallace MRN: [id number] DOB: [birth year]    Abigail Wallace is a 77 y.o. right-handed Caucasian female with well-controlled, diabetes mellitus, atrial fibrllation, CHF, COPD, hypertension, GERD, bipolar disease, and tardive dyskinesia returning to the clinic for follow-up of memory changes and starring spells.  The patient was accompanied to the clinic by son, [name],  who also provides collateral information.    IMPRESSION/PLAN: Cognitive impairment due to underlying mood disorder and possibly overlapping dementia. She has history of bipolar disorder and has delusional thoughts.  She scored 11/22 on MOCA missing points in all domains. MRI brain was reviewed with patient which shows very small left frontal meningioma.  Patient and son was reassured that this is benign and not causing any of her symptoms. I recommend that she continue to follow-up with her psychiatry team.  Formal neuropsychological testing declined. Left > right foot drop may be due to L5 radiculopathy, given the involvement of inversion and bilateral involvement.  She also has underlying neuropathy which can cause distal weakness/sensory loss, but this tends to be symmetric.  Given that she is nonambulatory, there is low value in doing additional testing such as NCS/EMG or imaging the lumbar spine.  Recommend that she continue PT.   Return to clinic as needed  --------------------------------------------- History of present illness: For the past 2 years, she has tingling and numbness of the feet. Symptoms are constant, no exacerbating or alleviating factors.  She is very sedentary and essentially wheelchair-bound and dependent on ADLs such as bathing, dressing, and transfer. Her son is primary caregiver. She has history of polio which resulted in leg length discrepancy and chronic hip pain, because of this, she has fear of walking and prefers to stay in her chair.  She also  complains of tingling in her forearm and hands.  She denies tongue numbness and reports mores of tongue movements. She has known history of tardive dyskinesia.  UPDATE 09/26/2022:  She is here for follow-up visit due to concern of memory changes and starring spells. Son says that he is not so much concerned about memory or starring spells, but would like to discuss her MRI results which showed a small meningioma. Patient reports that people are coming into her home and kicking her legs, which son denies.  Son manages medications and helps with ADLs, because she is nonambulatory due to hip pathology.  She is not able to stand or assist with transfers.  They have a caregiver that comes during the day.    Over the past two months, they have also noticed that she has left foot weakness where she has little movement in it. She denies low back pain, shooting pain down the legs.  She has numbness/tingling in the lower legs and feet for several years.    Medications:  Current Outpatient Medications on File Prior to Visit  Medication Sig Dispense Refill   acetaminophen (TYLENOL) 500 MG tablet Take 1,000 mg by mouth every 6 (six) hours as needed for mild pain or headache.     albuterol (VENTOLIN HFA) 108 (90 Base) MCG/ACT inhaler Inhale 2 puffs into the lungs every 6 (six) hours as needed for wheezing or shortness of breath. 18 g 3   allopurinol (ZYLOPRIM) 100 MG tablet Take 1 tablet (100 mg total) by mouth daily. 90 tablet 3   amLODipine (NORVASC) 10 MG tablet Take 1 tablet (10 mg total) by mouth daily. 90 tablet 3  apixaban (ELIQUIS) 5 MG TABS tablet Take 1 tablet (5 mg total) by mouth 2 (two) times daily. 180 tablet 3   Blood Glucose Monitoring Suppl (ONETOUCH VERIO) w/Device KIT Check blood sugar daily. E11.9 1 kit 0   cholecalciferol (VITAMIN D3) 25 MCG (1000 UNIT) tablet Take 1 tablet (1,000 Units total) by mouth daily. 90 tablet 3   diclofenac Sodium (VOLTAREN) 1 % GEL Apply 2 g topically 4 (four)  times daily as needed (pain). 350 g 2   divalproex (DEPAKOTE) 250 MG DR tablet Take 1 tablet (250 mg total) by mouth every 12 (twelve) hours. 60 tablet 1   Fluticasone-Umeclidin-Vilant (TRELEGY ELLIPTA) 100-62.5-25 MCG/ACT AEPB Inhale 1 puff into the lungs daily. 60 each 3   furosemide (LASIX) 40 MG tablet Take 1 tablet (40 mg total) by mouth every other day. 90 tablet 3   glucose blood (ONETOUCH VERIO) test strip Please use to check blood sugar once daily. E11.9 100 each 12   hydrocortisone cream 0.5 % Apply 1 application topically 2 (two) times daily. Apply WITH Nystatin under left breast 30 g 0   Incontinence Supplies MISC 1 Units by Does not apply route as needed. 100 each prn   ipratropium-albuterol (DUONEB) 0.5-2.5 (3) MG/3ML SOLN Take 3 mLs by nebulization every 4 (four) hours as needed. 360 mL 3   lactulose (CHRONULAC) 10 GM/15ML solution Take 15 mLs (10 g total) by mouth 2 (two) times daily as needed for mild constipation. 946 mL 1   Lancet Device MISC 1 Device by Does not apply route 3 (three) times a week. 1 each 11   metoprolol tartrate (LOPRESSOR) 100 MG tablet Take 1 tablet (100 mg total) by mouth 3 (three) times daily as needed. (Patient taking differently: Take 100 mg by mouth 2 (two) times daily.) 270 tablet 3   mupirocin cream (BACTROBAN) 2 % Apply 1 application. topically 2 (two) times daily. (Patient taking differently: Apply 1 application  topically 2 (two) times daily as needed (to affected areas- for irritation).) 15 g 3   nitroGLYCERIN (NITROSTAT) 0.4 MG SL tablet Place 1 tablet (0.4 mg total) under the tongue every 5 (five) minutes as needed for chest pain. 25 tablet 6   nystatin (MYCOSTATIN/NYSTOP) powder Apply 1 Application topically 3 (three) times daily. 60 g 1   OLANZapine (ZYPREXA) 5 MG tablet Take 5 mg by mouth at bedtime.     OneTouch Delica Lancets 33G MISC Use to test once daily.  DX code:E11.9 100 each 3   OVER THE COUNTER MEDICATION Apply 1 application  topically  See admin instructions. Caldesene Medicated Protecting Powder- Apply under the breasts and between the skin folds 2 times a day as needed for irritation     pravastatin (PRAVACHOL) 40 MG tablet TAKE ONE TABLET BY MOUTH EVERY EVENING 90 tablet 2   senna (SENOKOT) 8.6 MG TABS tablet Take 1 tablet (8.6 mg total) by mouth daily as needed for mild constipation. 120 tablet 3   sodium bicarbonate 650 MG tablet Take 1 tablet (650 mg total) by mouth 2 (two) times daily. 180 tablet 3   TRADJENTA 5 MG TABS tablet TAKE ONE TABLET BY MOUTH DAILY 90 tablet 3   triamcinolone cream (KENALOG) 0.1 % Apply 1 Application topically 2 (two) times daily. Apply to L ear 30 g 0   No current facility-administered medications on file prior to visit.    Allergies:  Allergies  Allergen Reactions   Aripiprazole Anaphylaxis, Other (See Comments) and Shortness Of Breath  Tremors   Clozapine Anaphylaxis   Haloperidol Lactate Other (See Comments)    Severe tardive dyskinesias, somnolence, speech change    Benadryl [Diphenhydramine Hcl] Other (See Comments)    States affects her vision   Benztropine Other (See Comments)    Affects mobility   Latuda [Lurasidone Hcl] Other (See Comments)    "Tremors and shakes."    Lorazepam Other (See Comments)    Unknown reaction??   Remeron [Mirtazapine] Other (See Comments)    Tremors and shakes   Keflex [Cephalexin] Swelling and Other (See Comments)    Per patient, has tongue swelling with keflex but can tolerate amoxicillin   Codeine Other (See Comments)    Unknown reaction??   Latex Rash    Vital Signs:  BP (!) 171/72 Comment: Moving around alot  Pulse 73   Ht 5\' 6"  (1.676 m)   SpO2 92%   BMI 46.65 kg/m    Neurological Exam: MENTAL STATUS including orientation to time, place, person, recent and remote memory, attention span and concentration, language, and fund of knowledge is fair. Speech is not dysarthric.     09/26/2022    9:00 AM  Montreal Cognitive  Assessment   Attention: Read list of digits (0/2) 2  Attention: Read list of letters (0/1) 0  Attention: Serial 7 subtraction starting at 100 (0/3) 2  Language: Repeat phrase (0/2) 1  Language : Fluency (0/1) 0  Abstraction (0/2) 0  Delayed Recall (0/5) 0  Orientation (0/6) 6    CRANIAL NERVES:  She has reduced visual acuity due to glaucoma.  No visual field defects.  Pupils equal round and reactive to light.  Normal conjugate, extra-ocular eye movements in all directions of gaze.  Mild left ptosis (old).  Face is symmetric. Palate elevates symmetrically.  Tongue is midline.  Tardive dyskinesia with intermittent tongue protrusion  MOTOR:  No atrophy, fasciculations or abnormal movements.  No pronator drift.   Upper Extremity:  Right  Left  Deltoid  5/5   5/5   Biceps  5/5   5/5   Triceps  5/5   5/5   Finger extensors  5/5   5/5   Finger flexors  5/5   5/5   Dorsal interossei  5/5   5/5   Abductor pollicis  5/5   5/5   Tone (Ashworth scale)  0  0   Lower Extremity:  Right  Left  Hip flexors  5/5   5/5   Knee flexors  5/5   5/5   Knee extensors  5/5   5/5   Eversion 4/5  2/5  Inversion 4/5  2/5  Dorsiflexors  4/5   2/5   Plantarflexors  5/5   5/5   Toe extensors  4/5   2/5   Toe flexors  5/5   5/5   Tone (Ashworth scale)  0  0   MSRs:  Reflexes are 2+/4 in the arms and absent in the legs.  SENSORY:  Intact to vibration in the arms, reduced at the knees and absent at the ankles.  COORDINATION/GAIT:  Normal finger-to- nose-finger.  Patient is wheelchair bound and nonambulatory due to hip pain, gait not tested.   Data: CT head 02/28/2022: 1. No evidence of acute intracranial hemorrhage or acute infarct. 2. 5 mm dural-based mass overlying the mid-to-anterior left frontal lobe compatible with a small meningioma. No mass effect upon the underlying brain parenchyma. 3. Redemonstrated chronic lacunar infarct within the left thalamus.   Total time spent  reviewing records,  interview, history/exam, documentation, and coordination of care on day of encounter:  35 min    Thank you for allowing me to participate in patient's care.  If I can answer any additional questions, I would be pleased to do so.    Sincerely,    Voula Waln K. Allena Katz, DO

## 2022-09-26 NOTE — Telephone Encounter (Signed)
Called son regarding labs.   Neurology reviewed meningioma.  Neurology thinks this is related to mental health.  Discussed results at length.  Has Enterococcus UTI. Has tolerated amoxicillin in past. Sensitive to penicllins. Discussed concerns about colonization and removing catheter.   Nursing- please call [name] and ask to redraw BMP.    All questions answered. Total time on phone 15 minutes.   Terisa Starr, MD  Family Medicine Teaching Service

## 2022-09-28 DIAGNOSIS — I130 Hypertensive heart and chronic kidney disease with heart failure and stage 1 through stage 4 chronic kidney disease, or unspecified chronic kidney disease: Secondary | ICD-10-CM | POA: Diagnosis not present

## 2022-09-28 DIAGNOSIS — Z466 Encounter for fitting and adjustment of urinary device: Secondary | ICD-10-CM | POA: Diagnosis not present

## 2022-09-28 DIAGNOSIS — E1122 Type 2 diabetes mellitus with diabetic chronic kidney disease: Secondary | ICD-10-CM | POA: Diagnosis not present

## 2022-09-28 DIAGNOSIS — N184 Chronic kidney disease, stage 4 (severe): Secondary | ICD-10-CM | POA: Diagnosis not present

## 2022-09-28 DIAGNOSIS — D631 Anemia in chronic kidney disease: Secondary | ICD-10-CM | POA: Diagnosis not present

## 2022-09-28 DIAGNOSIS — I5043 Acute on chronic combined systolic (congestive) and diastolic (congestive) heart failure: Secondary | ICD-10-CM | POA: Diagnosis not present

## 2022-09-28 NOTE — Telephone Encounter (Signed)
Called [name]. Provided with verbal orders for BMP.   They are also requesting order to increase size of foley catheter to 18FR due to patient having issues with leaking.   Dr. Manson Passey provided verbal order and signed paperwork. Called and spoke with [facility] and provided with verbal order. Faxed signed paperwork to [name].   Veronda Prude, RN

## 2022-09-29 ENCOUNTER — Telehealth: Payer: Self-pay | Admitting: Family Medicine

## 2022-09-29 ENCOUNTER — Telehealth: Payer: Self-pay | Admitting: Student
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
## 2022-09-29 NOTE — Telephone Encounter (Signed)
After Hours Call  - Son called and is worried about acute change in mom's behavior today. Throughout the day, she has been more agitated. - Reports she has HA and stomach pain that has worsened throughout the day. - She also complains that she has trouble breathing - Son is worried this may be related to her CHF and potential fluid overload. - Son checked her vitals:  - HR 72  - BP: 118/78, 140/80  - Satting 96-97% on RA  - Temp 97.30F - She has had 1900UOP since 7am today. She is tolerating PO. Had ~186ml fluid intake (water and juice).  - CBG 136 - Son gave 2 breathing treatments. Pt last took lasix yesterday (usually take every other day).  - Son reports her ankles were more swollen and has been less swollen today  A/P Vitals are stable, UOP adequate, and pt tolerating PO which are all reassuring. I offered clinic follow-up but pt has difficulty with transportation and it causes pt a lot of stress. I advised to give 1 additional dose lasix today, continue normal dosing tomorrow. - Advised that if pt was not improving, then to call clinic for an office visit. If in-person visit is not feasible, then we could attempt virtual visit, although this would be less valuable than in-person visit and potential need for labs. - I provided strict ED precautions and vitals ranges for son to monitor.

## 2022-09-30 ENCOUNTER — Encounter: Payer: Self-pay | Admitting: Family Medicine

## 2022-10-01 ENCOUNTER — Ambulatory Visit (INDEPENDENT_AMBULATORY_CARE_PROVIDER_SITE_OTHER): Payer: 59

## 2022-10-01 DIAGNOSIS — I442 Atrioventricular block, complete: Secondary | ICD-10-CM | POA: Diagnosis not present

## 2022-10-01 NOTE — Telephone Encounter (Signed)
Please call son and find out more details--it looks like the entire anterior lage has some deformity? She likely needs to be seen  Terisa Starr, MD  [facility] Medicine Teaching Service

## 2022-10-02 DIAGNOSIS — Z466 Encounter for fitting and adjustment of urinary device: Secondary | ICD-10-CM | POA: Diagnosis not present

## 2022-10-02 DIAGNOSIS — I130 Hypertensive heart and chronic kidney disease with heart failure and stage 1 through stage 4 chronic kidney disease, or unspecified chronic kidney disease: Secondary | ICD-10-CM | POA: Diagnosis not present

## 2022-10-02 DIAGNOSIS — N184 Chronic kidney disease, stage 4 (severe): Secondary | ICD-10-CM | POA: Diagnosis not present

## 2022-10-02 DIAGNOSIS — D631 Anemia in chronic kidney disease: Secondary | ICD-10-CM | POA: Diagnosis not present

## 2022-10-02 DIAGNOSIS — I5043 Acute on chronic combined systolic (congestive) and diastolic (congestive) heart failure: Secondary | ICD-10-CM | POA: Diagnosis not present

## 2022-10-02 DIAGNOSIS — E1122 Type 2 diabetes mellitus with diabetic chronic kidney disease: Secondary | ICD-10-CM | POA: Diagnosis not present

## 2022-10-04 ENCOUNTER — Telehealth: Payer: Self-pay | Admitting: Family Medicine

## 2022-10-04 DIAGNOSIS — J811 Chronic pulmonary edema: Secondary | ICD-10-CM | POA: Diagnosis not present

## 2022-10-04 NOTE — Telephone Encounter (Signed)
Called son. BUN up to 43, Cr up to 2.02.   EMS was called on 9/14 and recommended ED evaluation.  Recommended and offered ED evaluation. Discussed options. Recommended holding Lasix X 2 days, resume every other dosing thereafter.   Nursing- please call [facility] and ask to obtain CBC and CMP on Monday 9/22.   Thank you Terisa Starr, MD  [facility] Medicine Teaching Service

## 2022-10-05 NOTE — Telephone Encounter (Signed)
Called [name].   Spoke with [name] and provided with verbal orders for CBC and CMP.   Veronda Prude, RN

## 2022-10-08 DIAGNOSIS — Z466 Encounter for fitting and adjustment of urinary device: Secondary | ICD-10-CM | POA: Diagnosis not present

## 2022-10-08 DIAGNOSIS — D631 Anemia in chronic kidney disease: Secondary | ICD-10-CM | POA: Diagnosis not present

## 2022-10-08 DIAGNOSIS — E1122 Type 2 diabetes mellitus with diabetic chronic kidney disease: Secondary | ICD-10-CM | POA: Diagnosis not present

## 2022-10-08 DIAGNOSIS — N184 Chronic kidney disease, stage 4 (severe): Secondary | ICD-10-CM | POA: Diagnosis not present

## 2022-10-08 DIAGNOSIS — I130 Hypertensive heart and chronic kidney disease with heart failure and stage 1 through stage 4 chronic kidney disease, or unspecified chronic kidney disease: Secondary | ICD-10-CM | POA: Diagnosis not present

## 2022-10-08 DIAGNOSIS — I5043 Acute on chronic combined systolic (congestive) and diastolic (congestive) heart failure: Secondary | ICD-10-CM | POA: Diagnosis not present

## 2022-10-10 ENCOUNTER — Telehealth: Payer: Self-pay

## 2022-10-10 ENCOUNTER — Ambulatory Visit: Payer: 59 | Admitting: Podiatry

## 2022-10-10 NOTE — Telephone Encounter (Signed)
Received VM from Grover Canavan, RN Case Manager at [facility] regarding patient. She reports that patient has a quarter sized wound on left upper thigh.   States that this looked like a water blister that has popped. She is asking if provider has any skin care orders.   Attempted to call Grover Canavan back. No answer, LVM asking that she call back for more information.   Will also reach out to son for further information.   Veronda Prude, RN

## 2022-10-11 ENCOUNTER — Telehealth: Payer: Self-pay | Admitting: Family Medicine

## 2022-10-11 NOTE — Telephone Encounter (Signed)
Attempted to call patient's son. Reached voicemail, left generic voicemail to call back.   If he calls back please let him know His mom's creatinine is 2.14. This is up slightly but similar in many ways to prior.  All other labs look appropriate.  He should continue lasix ever other day--please ask him NOT to give more frequently.   Terisa Starr, MD  Family Medicine Teaching Service

## 2022-10-11 NOTE — Telephone Encounter (Signed)
Attempted to return call to son. He did not answer, LVM asking that he return call to office.   Veronda Prude, RN

## 2022-10-12 ENCOUNTER — Telehealth: Payer: Self-pay | Admitting: Student

## 2022-10-12 ENCOUNTER — Emergency Department (HOSPITAL_COMMUNITY): Payer: 59

## 2022-10-12 ENCOUNTER — Other Ambulatory Visit: Payer: Self-pay

## 2022-10-12 ENCOUNTER — Encounter (HOSPITAL_COMMUNITY): Payer: Self-pay | Admitting: Emergency Medicine

## 2022-10-12 ENCOUNTER — Observation Stay (HOSPITAL_COMMUNITY)
Admission: EM | Admit: 2022-10-12 | Discharge: 2022-10-13 | Disposition: A | Discharge status: Home / Self Care | Payer: 59 | Attending: Family Medicine | Admitting: Family Medicine

## 2022-10-12 ENCOUNTER — Encounter: Payer: Self-pay | Admitting: Family Medicine

## 2022-10-12 DIAGNOSIS — N1832 Chronic kidney disease, stage 3b: Secondary | ICD-10-CM | POA: Diagnosis not present

## 2022-10-12 DIAGNOSIS — Z7901 Long term (current) use of anticoagulants: Secondary | ICD-10-CM | POA: Diagnosis not present

## 2022-10-12 DIAGNOSIS — T83511A Infection and inflammatory reaction due to indwelling urethral catheter, initial encounter: Secondary | ICD-10-CM | POA: Diagnosis not present

## 2022-10-12 DIAGNOSIS — Z79899 Other long term (current) drug therapy: Secondary | ICD-10-CM | POA: Insufficient documentation

## 2022-10-12 DIAGNOSIS — Z7951 Long term (current) use of inhaled steroids: Secondary | ICD-10-CM | POA: Diagnosis not present

## 2022-10-12 DIAGNOSIS — N179 Acute kidney failure, unspecified: Secondary | ICD-10-CM | POA: Diagnosis not present

## 2022-10-12 DIAGNOSIS — J441 Chronic obstructive pulmonary disease with (acute) exacerbation: Secondary | ICD-10-CM | POA: Diagnosis not present

## 2022-10-12 DIAGNOSIS — I4589 Other specified conduction disorders: Secondary | ICD-10-CM | POA: Diagnosis not present

## 2022-10-12 DIAGNOSIS — Y730 Diagnostic and monitoring gastroenterology and urology devices associated with adverse incidents: Secondary | ICD-10-CM | POA: Insufficient documentation

## 2022-10-12 DIAGNOSIS — D72829 Elevated white blood cell count, unspecified: Secondary | ICD-10-CM | POA: Diagnosis not present

## 2022-10-12 DIAGNOSIS — Z743 Need for continuous supervision: Secondary | ICD-10-CM | POA: Diagnosis not present

## 2022-10-12 DIAGNOSIS — I5043 Acute on chronic combined systolic (congestive) and diastolic (congestive) heart failure: Secondary | ICD-10-CM | POA: Diagnosis not present

## 2022-10-12 DIAGNOSIS — I110 Hypertensive heart disease with heart failure: Secondary | ICD-10-CM | POA: Diagnosis not present

## 2022-10-12 DIAGNOSIS — I509 Heart failure, unspecified: Secondary | ICD-10-CM

## 2022-10-12 DIAGNOSIS — J449 Chronic obstructive pulmonary disease, unspecified: Secondary | ICD-10-CM | POA: Diagnosis present

## 2022-10-12 DIAGNOSIS — R141 Gas pain: Secondary | ICD-10-CM | POA: Diagnosis not present

## 2022-10-12 DIAGNOSIS — I130 Hypertensive heart and chronic kidney disease with heart failure and stage 1 through stage 4 chronic kidney disease, or unspecified chronic kidney disease: Secondary | ICD-10-CM | POA: Diagnosis not present

## 2022-10-12 DIAGNOSIS — Z91040 Latex allergy status: Secondary | ICD-10-CM | POA: Diagnosis not present

## 2022-10-12 DIAGNOSIS — R609 Edema, unspecified: Secondary | ICD-10-CM | POA: Diagnosis not present

## 2022-10-12 DIAGNOSIS — J4489 Other specified chronic obstructive pulmonary disease: Secondary | ICD-10-CM | POA: Diagnosis present

## 2022-10-12 DIAGNOSIS — R0989 Other specified symptoms and signs involving the circulatory and respiratory systems: Secondary | ICD-10-CM | POA: Diagnosis not present

## 2022-10-12 DIAGNOSIS — I5042 Chronic combined systolic (congestive) and diastolic (congestive) heart failure: Principal | ICD-10-CM | POA: Insufficient documentation

## 2022-10-12 DIAGNOSIS — R6889 Other general symptoms and signs: Secondary | ICD-10-CM | POA: Diagnosis not present

## 2022-10-12 DIAGNOSIS — R109 Unspecified abdominal pain: Secondary | ICD-10-CM | POA: Diagnosis not present

## 2022-10-12 DIAGNOSIS — E119 Type 2 diabetes mellitus without complications: Secondary | ICD-10-CM

## 2022-10-12 DIAGNOSIS — Z950 Presence of cardiac pacemaker: Secondary | ICD-10-CM | POA: Insufficient documentation

## 2022-10-12 DIAGNOSIS — N189 Chronic kidney disease, unspecified: Secondary | ICD-10-CM | POA: Diagnosis present

## 2022-10-12 DIAGNOSIS — I517 Cardiomegaly: Secondary | ICD-10-CM | POA: Diagnosis not present

## 2022-10-12 DIAGNOSIS — E1122 Type 2 diabetes mellitus with diabetic chronic kidney disease: Secondary | ICD-10-CM | POA: Insufficient documentation

## 2022-10-12 DIAGNOSIS — R0602 Shortness of breath: Secondary | ICD-10-CM | POA: Diagnosis not present

## 2022-10-12 DIAGNOSIS — R9431 Abnormal electrocardiogram [ECG] [EKG]: Secondary | ICD-10-CM | POA: Diagnosis present

## 2022-10-12 LAB — CBC WITH DIFFERENTIAL/PLATELET
Abs Immature Granulocytes: 0.37 10*3/uL — ABNORMAL HIGH (ref 0.00–0.07)
Basophils Absolute: 0.1 10*3/uL (ref 0.0–0.1)
Basophils Relative: 1 %
Eosinophils Absolute: 0.3 10*3/uL (ref 0.0–0.5)
Eosinophils Relative: 2 %
HCT: 34 % — ABNORMAL LOW (ref 36.0–46.0)
Hemoglobin: 10.8 g/dL — ABNORMAL LOW (ref 12.0–15.0)
Immature Granulocytes: 3 %
Lymphocytes Relative: 21 %
Lymphs Abs: 2.9 10*3/uL (ref 0.7–4.0)
MCH: 27.6 pg (ref 26.0–34.0)
MCHC: 31.8 g/dL (ref 30.0–36.0)
MCV: 86.7 fL (ref 80.0–100.0)
Monocytes Absolute: 0.8 10*3/uL (ref 0.1–1.0)
Monocytes Relative: 6 %
Neutro Abs: 9.3 10*3/uL — ABNORMAL HIGH (ref 1.7–7.7)
Neutrophils Relative %: 67 %
Platelets: 226 10*3/uL (ref 150–400)
RBC: 3.92 MIL/uL (ref 3.87–5.11)
RDW: 15.6 % — ABNORMAL HIGH (ref 11.5–15.5)
WBC: 13.8 10*3/uL — ABNORMAL HIGH (ref 4.0–10.5)
nRBC: 0 % (ref 0.0–0.2)

## 2022-10-12 LAB — COMPREHENSIVE METABOLIC PANEL
ALT: 9 U/L (ref 0–44)
AST: 14 U/L — ABNORMAL LOW (ref 15–41)
Albumin: 3.3 g/dL — ABNORMAL LOW (ref 3.5–5.0)
Alkaline Phosphatase: 56 U/L (ref 38–126)
Anion gap: 15 (ref 5–15)
BUN: 40 mg/dL — ABNORMAL HIGH (ref 8–23)
CO2: 20 mmol/L — ABNORMAL LOW (ref 22–32)
Calcium: 9.7 mg/dL (ref 8.9–10.3)
Chloride: 100 mmol/L (ref 98–111)
Creatinine, Ser: 2.02 mg/dL — ABNORMAL HIGH (ref 0.44–1.00)
GFR, Estimated: 25 mL/min — ABNORMAL LOW (ref >60)
Glucose, Bld: 182 mg/dL — ABNORMAL HIGH (ref 70–99)
Potassium: 3.8 mmol/L (ref 3.5–5.1)
Sodium: 135 mmol/L (ref 135–145)
Total Bilirubin: 0.4 mg/dL (ref 0.3–1.2)
Total Protein: 7 g/dL (ref 6.5–8.1)

## 2022-10-12 LAB — TROPONIN I (HIGH SENSITIVITY)
Troponin I (High Sensitivity): 13 ng/L (ref <18)
Troponin I (High Sensitivity): 14 ng/L (ref <18)

## 2022-10-12 LAB — BRAIN NATRIURETIC PEPTIDE: B Natriuretic Peptide: 539.3 pg/mL — ABNORMAL HIGH (ref 0.0–100.0)

## 2022-10-12 MED ORDER — NITROGLYCERIN 0.4 MG SL SUBL: 0.4000 mg | SUBLINGUAL_TABLET | SUBLINGUAL | Status: DC | PRN

## 2022-10-12 MED ORDER — PRAVASTATIN SODIUM 40 MG PO TABS: 40.0000 mg | ORAL_TABLET | Freq: Every evening | ORAL | Status: DC

## 2022-10-12 MED ORDER — UMECLIDINIUM BROMIDE 62.5 MCG/ACT IN AEPB
1.0000 | INHALATION_SPRAY | Freq: Every day | RESPIRATORY_TRACT | Status: DC
  Filled 2022-10-12: qty 7

## 2022-10-12 MED ORDER — SENNA 8.6 MG PO TABS: 1.0000 | ORAL_TABLET | Freq: Every day | ORAL | Status: DC | PRN

## 2022-10-12 MED ORDER — APIXABAN 5 MG PO TABS
5.0000 mg | ORAL_TABLET | Freq: Two times a day (BID) | ORAL | Status: DC
  Administered 2022-10-12 – 2022-10-13 (×2): 5 mg via ORAL
  Filled 2022-10-12 (×2): qty 1

## 2022-10-12 MED ORDER — ALBUTEROL SULFATE (2.5 MG/3ML) 0.083% IN NEBU: 3.0000 mL | INHALATION_SOLUTION | Freq: Four times a day (QID) | RESPIRATORY_TRACT | Status: DC | PRN

## 2022-10-12 MED ORDER — IPRATROPIUM-ALBUTEROL 0.5-2.5 (3) MG/3ML IN SOLN: 3.0000 mL | RESPIRATORY_TRACT | Status: DC | PRN

## 2022-10-12 MED ORDER — ACETAMINOPHEN 500 MG PO TABS
1000.0000 mg | ORAL_TABLET | Freq: Once | ORAL | Status: AC
  Administered 2022-10-12: 1000 mg via ORAL
  Filled 2022-10-12: qty 2

## 2022-10-12 MED ORDER — AMLODIPINE BESYLATE 10 MG PO TABS
10.0000 mg | ORAL_TABLET | Freq: Every day | ORAL | Status: DC
  Administered 2022-10-13: 10 mg via ORAL
  Filled 2022-10-12: qty 1

## 2022-10-12 MED ORDER — ACETAMINOPHEN 500 MG PO TABS
1000.0000 mg | ORAL_TABLET | Freq: Four times a day (QID) | ORAL | Status: DC | PRN
  Administered 2022-10-13: 1000 mg via ORAL
  Filled 2022-10-12: qty 2

## 2022-10-12 MED ORDER — DICLOFENAC SODIUM 1 % EX GEL
2.0000 g | Freq: Four times a day (QID) | CUTANEOUS | Status: DC | PRN
  Filled 2022-10-12: qty 100

## 2022-10-12 MED ORDER — FUROSEMIDE 10 MG/ML IJ SOLN
60.0000 mg | Freq: Once | INTRAMUSCULAR | Status: AC
  Administered 2022-10-12: 60 mg via INTRAVENOUS
  Filled 2022-10-12: qty 6

## 2022-10-12 MED ORDER — OLANZAPINE 5 MG PO TABS
5.0000 mg | ORAL_TABLET | Freq: Every day | ORAL | Status: DC
  Administered 2022-10-12: 5 mg via ORAL
  Filled 2022-10-12 (×2): qty 1

## 2022-10-12 MED ORDER — ALLOPURINOL 100 MG PO TABS
100.0000 mg | ORAL_TABLET | Freq: Every day | ORAL | Status: DC
  Administered 2022-10-13: 100 mg via ORAL
  Filled 2022-10-12: qty 1

## 2022-10-12 MED ORDER — METOPROLOL TARTRATE 50 MG PO TABS
150.0000 mg | ORAL_TABLET | Freq: Two times a day (BID) | ORAL | Status: DC
  Administered 2022-10-12 – 2022-10-13 (×2): 150 mg via ORAL
  Filled 2022-10-12: qty 6
  Filled 2022-10-12: qty 1

## 2022-10-12 MED ORDER — VITAMIN D 25 MCG (1000 UNIT) PO TABS
1000.0000 [IU] | ORAL_TABLET | Freq: Every day | ORAL | Status: DC
  Administered 2022-10-13: 1000 [IU] via ORAL
  Filled 2022-10-12: qty 1

## 2022-10-12 MED ORDER — SODIUM BICARBONATE 650 MG PO TABS
650.0000 mg | ORAL_TABLET | Freq: Two times a day (BID) | ORAL | Status: DC
  Administered 2022-10-12 – 2022-10-13 (×2): 650 mg via ORAL
  Filled 2022-10-12 (×2): qty 1

## 2022-10-12 MED ORDER — FLUTICASONE FUROATE-VILANTEROL 100-25 MCG/ACT IN AEPB
1.0000 | INHALATION_SPRAY | Freq: Every day | RESPIRATORY_TRACT | Status: DC
  Administered 2022-10-13: 1 via RESPIRATORY_TRACT
  Filled 2022-10-12: qty 28

## 2022-10-12 MED ORDER — DIVALPROEX SODIUM 250 MG PO DR TAB
250.0000 mg | DELAYED_RELEASE_TABLET | Freq: Two times a day (BID) | ORAL | Status: DC
  Administered 2022-10-12 – 2022-10-13 (×2): 250 mg via ORAL
  Filled 2022-10-12 (×2): qty 1

## 2022-10-12 NOTE — Telephone Encounter (Signed)
**  After Hours/ Emergency Line Call**  Received a call to report that [facility], from son [name] (confirmed DOB) who would like me to alert the ED that [name]. Loleta Chance will be arriving via EMS for evaluation for possible heart failure exacerbation per Dr. Manson Passey.  Endorsing shortness of breath slightly improved as well as oxygen but was told to come in for evaluation of fluid overload.  States that they will be arriving via ambulance.   I called and let Charge RN at [facility] know of patient's arrival.  Levin Erp, MD PGY-3, [facility] Family Medicine 10/12/2022 5:44 PM

## 2022-10-12 NOTE — ED Provider Notes (Signed)
[place] EMERGENCY DEPARTMENT AT [facility] Provider Note   CSN: [id number] Arrival date & time: 10/12/22  1910     History {Add pertinent medical, surgical, social history, OB history to HPI:1} Chief Complaint  Patient presents with   Abnormal Lab   Shortness of Breath    Kashara Blocher Loleta Chance is a 77 y.o. female.   Abnormal Lab Shortness of Breath 77 year old female with past medical history of diabetes mellitus, atrial fibrillation, CHF, COPD, hypertension, GERD, bipolar disease, tardive dyskinesia presenting for evaluation of shortness of breath.  History is provided primarily by son at bedside.  Son states that for the last week patient has had increasing shortness of breath.  She is largely immobile at baseline, but nevertheless has been more short of breath with activity.  Son keeps close tabs on her pulse ox, which she noted several times dipped into the 80s this past week.  She intermittently wears 2 L nasal cannula at home, but not constant.  Patient was sent by her primary care physician due to concern for fluid overload.  Denies any recent fevers, chest pain, palpitations, abdominal pain.  She has a chronic indwelling Foley.     Home Medications Prior to Admission medications   Medication Sig Start Date End Date Taking? Authorizing Provider  acetaminophen (TYLENOL) 500 MG tablet Take 1,000 mg by mouth every 6 (six) hours as needed for mild pain or headache.    [provider]  albuterol (VENTOLIN HFA) 108 (90 Base) MCG/ACT inhaler Inhale 2 puffs into the lungs every 6 (six) hours as needed for wheezing or shortness of breath. 09/20/22   Westley Chandler, MD  allopurinol (ZYLOPRIM) 100 MG tablet Take 1 tablet (100 mg total) by mouth daily. 04/16/22   Westley Chandler, MD  amLODipine (NORVASC) 10 MG tablet Take 1 tablet (10 mg total) by mouth daily. 11/13/21   Westley Chandler, MD  amoxicillin (AMOXIL) 500 MG capsule Take 1 capsule (500 mg total) by mouth 2 (two)  times daily. 09/26/22   Westley Chandler, MD  apixaban (ELIQUIS) 5 MG TABS tablet Take 1 tablet (5 mg total) by mouth 2 (two) times daily. 04/16/22 04/16/23  Westley Chandler, MD  Blood Glucose Monitoring Suppl (ONETOUCH VERIO) w/Device KIT Check blood sugar daily. E11.9 03/23/22   Carney Living, MD  cholecalciferol (VITAMIN D3) 25 MCG (1000 UNIT) tablet Take 1 tablet (1,000 Units total) by mouth daily. 04/16/22   Westley Chandler, MD  diclofenac Sodium (VOLTAREN) 1 % GEL Apply 2 g topically 4 (four) times daily as needed (pain). 04/30/22   Westley Chandler, MD  divalproex (DEPAKOTE) 250 MG DR tablet Take 1 tablet (250 mg total) by mouth every 12 (twelve) hours. 03/05/22   Rhetta Mura, MD  Fluticasone-Umeclidin-Vilant (TRELEGY ELLIPTA) 100-62.5-25 MCG/ACT AEPB Inhale 1 puff into the lungs daily. 09/13/22   Westley Chandler, MD  furosemide (LASIX) 40 MG tablet Take 1 tablet (40 mg total) by mouth every other day. 05/25/22 05/25/23  Westley Chandler, MD  glucose blood [facility] VERIO) test strip Please use to check blood sugar once daily. E11.9 06/01/22   Billey Co, MD  hydrocortisone cream 0.5 % Apply 1 application topically 2 (two) times daily. Apply WITH Nystatin under left breast 11/28/20   Westley Chandler, MD  Incontinence Supplies MISC 1 Units by Does not apply route as needed. 12/13/15   McKeag, Janine Ores, MD  ipratropium-albuterol (DUONEB) 0.5-2.5 (3) MG/3ML SOLN Take 3  mLs by nebulization every 4 (four) hours as needed. 09/13/22   Westley Chandler, MD  lactulose (CHRONULAC) 10 GM/15ML solution Take 15 mLs (10 g total) by mouth 2 (two) times daily as needed for mild constipation. 09/11/22   Westley Chandler, MD  Lancet Device MISC 1 Device by Does not apply route 3 (three) times a week. 06/01/22   Billey Co, MD  metoprolol tartrate (LOPRESSOR) 100 MG tablet Take 1 tablet (100 mg total) by mouth 3 (three) times daily as needed. Patient taking differently: Take 100 mg by mouth 2 (two) times  daily. 08/10/22   Croitoru, Mihai, MD  mupirocin cream (BACTROBAN) 2 % Apply 1 application. topically 2 (two) times daily. Patient taking differently: Apply 1 application  topically 2 (two) times daily as needed (to affected areas- for irritation). 03/24/21   Westley Chandler, MD  nitroGLYCERIN (NITROSTAT) 0.4 MG SL tablet Place 1 tablet (0.4 mg total) under the tongue every 5 (five) minutes as needed for chest pain. 08/03/22 11/01/22  Croitoru, Mihai, MD  nystatin (MYCOSTATIN/NYSTOP) powder Apply 1 Application topically 3 (three) times daily. 08/01/22   Westley Chandler, MD  OLANZapine (ZYPREXA) 5 MG tablet Take 5 mg by mouth at bedtime.    [provider]  OneTouch Delica Lancets 33G MISC Use to test once daily.  DX code:E11.9 06/01/22   Billey Co, MD  OVER THE COUNTER MEDICATION Apply 1 application  topically See admin instructions. Caldesene Medicated Protecting Powder- Apply under the breasts and between the skin folds 2 times a day as needed for irritation    [provider]  pravastatin (PRAVACHOL) 40 MG tablet TAKE ONE TABLET BY MOUTH EVERY EVENING 05/18/22   Croitoru, Mihai, MD  senna (SENOKOT) 8.6 MG TABS tablet Take 1 tablet (8.6 mg total) by mouth daily as needed for mild constipation. 07/10/22   Westley Chandler, MD  sodium bicarbonate 650 MG tablet Take 1 tablet (650 mg total) by mouth 2 (two) times daily. 04/07/22   Westley Chandler, MD  TRADJENTA 5 MG TABS tablet TAKE ONE TABLET BY MOUTH DAILY 05/18/22   Westley Chandler, MD  triamcinolone cream (KENALOG) 0.1 % Apply 1 Application topically 2 (two) times daily. Apply to L ear 04/06/22   Westley Chandler, MD      Allergies    Aripiprazole, Clozapine, Haloperidol lactate, Benadryl [diphenhydramine hcl], Benztropine, Latuda [lurasidone hcl], Lorazepam, Remeron [mirtazapine], Keflex [cephalexin], Codeine, and Latex    Review of Systems   Review of Systems  Respiratory:  Positive for shortness of breath.     Physical  Exam Updated Vital Signs BP (!) 146/75   Pulse 93   Temp 98.9 F (37.2 C) (Oral)   Resp (!) 24   Ht 5\' 6"  (1.676 m)   Wt (!) 137.5 kg   SpO2 98%   BMI 48.93 kg/m  Physical Exam Constitutional:      General: She is not in acute distress. HENT:     Head: Normocephalic.     Mouth/Throat:     Mouth: Mucous membranes are moist.  Eyes:     Extraocular Movements: Extraocular movements intact.     Pupils: Pupils are equal, round, and reactive to light.  Cardiovascular:     Rate and Rhythm: Normal rate.  Pulmonary:     Effort: Pulmonary effort is normal.     Breath sounds: Rhonchi present.  Chest:     Chest wall: No mass or tenderness.  Abdominal:  Palpations: Abdomen is soft.     Tenderness: There is no abdominal tenderness. There is no guarding or rebound.  Musculoskeletal:     Right lower leg: Edema present.     Left lower leg: Edema present.  Skin:    General: Skin is warm and dry.     Findings: No rash.  Neurological:     General: No focal deficit present.     Mental Status: She is alert.     Comments: Tardive dyskinesia of mouth     ED Results / Procedures / Treatments   Labs (all labs ordered are listed, but only abnormal results are displayed) Labs Reviewed - No data to display  EKG None  Radiology No results found.  Procedures Procedures  {Document cardiac monitor, telemetry assessment procedure when appropriate:1}  Medications Ordered in ED Medications - No data to display  ED Course/ Medical Decision Making/ A&P   {   Click here for ABCD2, HEART and other calculatorsREFRESH Note before signing :1}                              Medical Decision Making Amount and/or Complexity of Data Reviewed Labs: ordered. Radiology: ordered.  Risk OTC drugs. Prescription drug management.   Adley Castello Loleta Chance is a 77 y.o. female with history of history of heart failure, presenting with likely acute decompensated heart failure causing volume overload and  pulmonary edema.     I have reviewed the nursing documentation for past medical history, family history, and social history and agree.   I have reviewed the patient's vital signs, which demonstrate mild tachypnea, but remaining well oxygenated on room air.  Upon initial evaluation, the patient is well-appearing without acute respiratory distress.  She does have bilateral lower extremity edema..   Labs/Imaging:  Patient has a mild leukocytosis of 13.8 with a hemoglobin of 10.8 (consistent with baseline).  On metabolic panel, patient has an AKI on CKD with a creatinine of 2.02 with a baseline of 1.6.  Her BUN is also elevated at 40, likely indicating prerenal pathology.  Patient's BNP is elevated at 540.  Troponin unremarkable.  DDX considered:   The etiology of the decompensation is not certain but is likely due to decreased diuretic use.  Son states that she only gets 40 mg oral every other day.. Alternative etiologies I considered include cardiac (ACS, valvular disease, arrhythmia, myocarditis/endocarditis, dissection) however given unremarkable troponin, EKG, and cardiac exam I have low suspicion. Also considered but low risk for respiratory cause (COPD, asthma, PE, or PNA), or dietary indiscretion, alcohol or drug abuse, endocrine (thyrotoxicosis), and anemia.    Interventions:  The patient was given lasix    Reevaluation:  Patient re-evaluated ***.     Final Clinical Impression: ***.   Post-ED Care:  The patient was admitted for acute management of ADHF.      The plan for this patient was discussed with my attending physician, who voiced agreement and who oversaw evaluation and treatment of this patient.    Note: Chief Executive Officer was used in the creation of this note.   {Document critical care time when appropriate:1} {Document review of labs and clinical decision tools ie heart score, Chads2Vasc2 etc:1}  {Document your independent review of radiology images,  and any outside records:1} {Document your discussion with family members, caretakers, and with consultants:1} {Document social determinants of health affecting pt's care:1} {Document your decision making why or why  not admission, treatments were needed:1} Final Clinical Impression(s) / ED Diagnoses Final diagnoses:  None    Rx / DC Orders ED Discharge Orders     None

## 2022-10-12 NOTE — H&P (Shared)
Hospital Admission History and Physical Service Pager: [phone]  Patient name: Abigail Wallace Medical record number: [id number] Date of Birth: [birth year] Age: 77 y.o. Gender: female  Primary Care Provider: Westley Chandler, MD Consultants: None Code Status: Full Code  Preferred Emergency Contact: Abigail Mealy [facility]) [phone] [facility])  Wallace Complaint: Worsening SOB, Edema.  Assessment and Plan: Abigail Wallace is a 77 y.o. female presenting with congestion, decreased activity, shortness of breath and edema in the setting of combined systolic/diastolic heart failure and COPD. Differential for presentation of this includes heart failure exacerbation, COPD exacerbation with an uncommon presentation, infectious myocarditis of unknown origin, or other new cardiac complication. The most likely cause is heart failure exacerbation as given by her history, her clinical presentation, and her elevated BNP values and chest xray.   Assessment & Plan Acute on chronic combined systolic and diastolic heart failure (HCC) Patient has history of chronic combined systolic and diastolic heart failure with an EF of 45-50% as measured by her most recent echocardiogram on 03/15/2022. She arrived with 1+ pitting edema in extremities and BNP 539, and received IV Lasix 60 mg x 1.  - Admit to FMTS, progressive care floor, attending Dr Abigail Wallace. - Consider repeat echocardiogram - In/Out - Daily Weights - Keep K > 4.0, Mg > 2.0 - Lasix 40 mg IV at 8 AM  Acute kidney injury [facility]) Patient had an acute kidney injury on top of her Stage IIIb CKD at presentation to the ED as measured by her elevated BUN (40) and Creatinine (2.02), which is above her baseline (~1.4 -1.6). Etiology like 2/2 to volume overload. Will continue diuresis and trend Cr. - Sodium bicarbonate 650 mg BID - BMP AM labs. - Continue Diuresis Prolonged Q-T interval on ECG Patient noted to have prolonged QT interval of 512 on  admission. Previous EKG's have also been prolonged. This may be stemming from her Psych medications. Will continue to monitor. -Avoid QT prolonging agents  Chronic and Stable Problems:  Type 2 Diabetes Mellitus: Pt takes trajenda outpatient. A1c 5.8 on 02/26/22 COPD: albuterol (nebulizer), breo-ellipta, incruse ellipta, duoneb Paroxysmal atrial fibrillation: apixaban Hypertension: metoprolol, amlodidpine,  Bipolar Disorder / Paranoid Schizophrenia: zyprexa, valproic acid Mixed hyperlipidemia: pravastatin  FEN/GI: HH/Carb modified diet VTE Prophylaxis: apixaban   Disposition: Home  History of Present Illness:  Abigail Wallace is a 77 y.o. female presenting with shortness of breath, edema in the setting of Chronic Combined Systolic and diastolic heart failure and COPD. History is primarily provided by her son who is her primary care taker.  Current episode began approximately 3 weeks ago and has been marked by reduced energy and reluctance to participate in her normal activities. She has also had a non-productive cough that has increased more over the last few days. She has had an increased need for supplemental O2 above her baseline.   Per Son: Her PCP had several labs drawn for her, and they showed that her fluid level was up (BNP). They were called by her PCP and told to go in for a CXR w/ concern for CHF exacerbation. He also notes that her extremities were swelling more. Notes extremities have been swelling for the last 3 weeks. Son also appreciated that home O2 sats were around 92% and she was dyspneic, so he placed her on 1.5 L O2. No fevers or systemic symptoms. Report's some coughing, but no phlegm.   In the ED, Patient has a mild leukocytosis of 13.8 with a  hemoglobin of 10.8 (consistent with baseline).  On metabolic panel, patient has an AKI on CKD with a creatinine of 2.02 , BNP is elevated at 539, troponin unremarkable. She was given IV Lasix 60 mg x 1.     Review Of Systems: Per  HPI with the following additions:   Pertinent Past Medical History: CHF DM2 -tradjenta  Pacemaker - for tachyrhythmia COPD HTN GERD Gout CKD IIIb HLD Bipolar disorder Hernia Abdominal Polio as a child  Remainder reviewed in history tab.   Pertinent Past Surgical History: Pacemaker Arm surgery for broken arm Cholecystectomy   Remainder reviewed in history tab.  Pertinent Social History: Tobacco use: Yes/No/Former Alcohol use: No Other Substance use: No Lives with son  Pertinent Family History: Heart disease - unsure of history DM2 - Husband  Remainder reviewed in history tab.   Important Outpatient Medications: Allopurinol 100 mg daily Amlodipine 10 mg Apixaban 5 mg BID Vit D3 in am Depakote 250 mg DR, BID Trelegy Ellipa Lasix 40 mg every other day Lactulose 10 - not taking Metoprolol tartate 150 mg BID Nitroglycerin prn 0.4 mg Zyprexa 5 mg, at bedtime Pravastatin 40 mg at bedtime Senna 8.6 prn NA HCO3 650 mg BID Tradjenta 5 mg daily Duonebs TID Na bicarb  Remainder reviewed in medication history.   Objective: BP 119/79   Pulse 70   Temp 98.9 F (37.2 C) (Oral)   Resp 20   Ht 5\' 6"  (1.676 m)   Wt (!) 137.5 kg   SpO2 99%   BMI 48.93 kg/m  Exam: General: Pt is an anxious appearing, obese woman who appears her stated age. She is dressed in a hospital gown. Eyes: EOMI ENTM: MMM  Cardiovascular: Regular rate, rhythm regular by both auscultation and telemetry. No murmur was appreciated. No gallops or rubs. +1 pitting edema present bilaterally. Pulses symmetrical. Respiratory: High pitched-wheeze on exhalation present in all lung fields. No crackles present. No accessory muscle use or increased WOB. Pt satting comfortably on RA Gastrointestinal: Bowel sounds present, abdominal hernia appreciated. PT was NTTP. Fluid level difficult to assess on abdomen due to habitus, but no fluid wave appreciated. MSK: Pt has joint deformities consistent with  advanced arthritis in her hands. Pt has muscles that appear weakened by deconditioning.  Derm: Pt has thin skin, with numerous folds consistent with recent weight changes. Neuro: Patient was alert and oriented x4. She had no focal neural deficits.  Psych: Mood: "okay." Affect: incongruent, patient anxious and repeatedly asked about the other members of the care team and whether they were alright. Per son, she is approximately at her baseline. Deferred mental status exam at this time.  Labs:  CBC BMET  Recent Labs  Lab 10/12/22 2005  WBC 13.8*  HGB 10.8*  HCT 34.0*  PLT 226   Recent Labs  Lab 10/12/22 2005  NA 135  K 3.8  CL 100  CO2 20*  BUN 40*  CREATININE 2.02*  GLUCOSE 182*  CALCIUM 9.7    Pt has elevated WBC, however this is not inconsistent with several of her previous WBC counts. She doesh have an elevated absolute neutrophil count.   Pertinent additional labs:  Latest Reference Range & Units 10/12/22 20:05 10/12/22 21:53  Troponin I (High Sensitivity) <18 ng/L 13 14  Low suspicion for an acute coronary syndrome.          Component Ref Range & Units 1 d ago 4 mo ago 7 mo ago 9 mo ago 1 yr ago 3 yr  ago  B Natriuretic Peptide 0.0 - 100.0 pg/mL 539.3 High  543.2 High  CM 690.8 High  CM 356.0 High  CM      BNP could be elevated for other reasons, but this is most consistent with a heart failure exacerbation.   EKG: HR75, QRS 512. Comparing this EKG with her last one, she has consistent sinus rhythm, but pacemaker spikes are not apparent on the current one. Pt has prolonged QTC of 512, but this is reduced relative to previous 540.    Imaging Studies Performed:  Chest X Ray, 1 View, 10/12/2022 PORTABLE CHEST 1 VIEW COMPARISON:  03/04/2022 Left pacer remains in place, unchanged. Cardiomegaly, vascular congestion. No confluent airspace opacity or effusion. No acute bony abnormality.  Impression from Radiologist: Cardiomegaly, vascular congestion.   Margaretmary Dys, MD 10/12/2022, 10:08 PM PGY-1, [facility] Family Medicine  FPTS Intern pager: [phone], text pages welcome Secure chat group [facility] Teaching Service   Upper Level Addendum: I have seen and evaluated this patient along with Dr. Weston Settle and reviewed the above note, making necessary revisions as appropriate. I agree with the medical decision making and physical exam as noted above. Bess Kinds, MD PGY-2 [facility] Family Medicine Residency

## 2022-10-12 NOTE — Telephone Encounter (Signed)
Pt called back and was informed.  He also wants Dr. Manson Passey know that he put patient back on oxygen @ 1.5 liters / min and wants to know if that is ok. Jone Baseman, CMA

## 2022-10-12 NOTE — Telephone Encounter (Signed)
Returned call to son. He did not answer, LVM asking that he return call to office.   Veronda Prude, RN

## 2022-10-12 NOTE — ED Triage Notes (Signed)
Pt arrived via EMS from home. EMS reports pts doctor called and requested pt be transported to hospital for abnormal labs and possible fluid overload. Pt reports recent increase in shortness in breath. Pt states she wears 2L [place] as needed.

## 2022-10-12 NOTE — Telephone Encounter (Signed)
Called patient and patient's son.  Patient has ongoing congestion, cough, not feeling well, less activity over past few days. Son noticed oxygen levels 90-92% earlier this week. Usually above 94%. As such, he placed her oxygen. Yesterday, she complained of difficulty breathing. She did start to turn around today. First time in a few weeks, was able get outside in chair today. More herself today.   Given changes, recommended being evaluated. Patient and son agreeable to go to ED.  Terisa Starr, MD  Family Medicine Teaching Service

## 2022-10-13 DIAGNOSIS — R9431 Abnormal electrocardiogram [ECG] [EKG]: Secondary | ICD-10-CM | POA: Diagnosis present

## 2022-10-13 DIAGNOSIS — R141 Gas pain: Secondary | ICD-10-CM | POA: Diagnosis not present

## 2022-10-13 DIAGNOSIS — J449 Chronic obstructive pulmonary disease, unspecified: Secondary | ICD-10-CM | POA: Diagnosis not present

## 2022-10-13 DIAGNOSIS — Z7401 Bed confinement status: Secondary | ICD-10-CM | POA: Diagnosis not present

## 2022-10-13 DIAGNOSIS — I5043 Acute on chronic combined systolic (congestive) and diastolic (congestive) heart failure: Secondary | ICD-10-CM

## 2022-10-13 DIAGNOSIS — R0609 Other forms of dyspnea: Secondary | ICD-10-CM | POA: Diagnosis not present

## 2022-10-13 DIAGNOSIS — I5042 Chronic combined systolic (congestive) and diastolic (congestive) heart failure: Secondary | ICD-10-CM | POA: Diagnosis not present

## 2022-10-13 LAB — CBC
HCT: 34.1 % — ABNORMAL LOW (ref 36.0–46.0)
Hemoglobin: 11 g/dL — ABNORMAL LOW (ref 12.0–15.0)
MCH: 28.1 pg (ref 26.0–34.0)
MCHC: 32.3 g/dL (ref 30.0–36.0)
MCV: 87.2 fL (ref 80.0–100.0)
Platelets: 226 10*3/uL (ref 150–400)
RBC: 3.91 MIL/uL (ref 3.87–5.11)
RDW: 15.6 % — ABNORMAL HIGH (ref 11.5–15.5)
WBC: 16.4 10*3/uL — ABNORMAL HIGH (ref 4.0–10.5)
nRBC: 0 % (ref 0.0–0.2)

## 2022-10-13 LAB — HEMOGLOBIN A1C
Hgb A1c MFr Bld: 6.2 % — ABNORMAL HIGH (ref 4.8–5.6)
Mean Plasma Glucose: 131.24 mg/dL

## 2022-10-13 LAB — BASIC METABOLIC PANEL
Anion gap: 12 (ref 5–15)
BUN: 42 mg/dL — ABNORMAL HIGH (ref 8–23)
CO2: 19 mmol/L — ABNORMAL LOW (ref 22–32)
Calcium: 9.8 mg/dL (ref 8.9–10.3)
Chloride: 102 mmol/L (ref 98–111)
Creatinine, Ser: 2.03 mg/dL — ABNORMAL HIGH (ref 0.44–1.00)
GFR, Estimated: 25 mL/min — ABNORMAL LOW (ref >60)
Glucose, Bld: 125 mg/dL — ABNORMAL HIGH (ref 70–99)
Potassium: 3.9 mmol/L (ref 3.5–5.1)
Sodium: 133 mmol/L — ABNORMAL LOW (ref 135–145)

## 2022-10-13 MED ORDER — FUROSEMIDE 10 MG/ML IJ SOLN
40.0000 mg | Freq: Once | INTRAMUSCULAR | Status: AC
  Administered 2022-10-13: 40 mg via INTRAVENOUS
  Filled 2022-10-13: qty 4

## 2022-10-13 MED ORDER — SIMETHICONE 80 MG PO CHEW
160.0000 mg | CHEWABLE_TABLET | Freq: Once | ORAL | Status: AC
  Administered 2022-10-13: 160 mg via ORAL
  Filled 2022-10-13: qty 2

## 2022-10-13 MED ORDER — FUROSEMIDE 40 MG PO TABS: 40.0000 mg | ORAL_TABLET | Freq: Every day | ORAL | Status: DC

## 2022-10-13 NOTE — Plan of Care (Signed)
  Problem: Clinical Measurements: Goal: Will remain free from infection Outcome: Progressing   Problem: Clinical Measurements: Goal: Diagnostic test results will improve Outcome: Progressing   Problem: Clinical Measurements: Goal: Respiratory complications will improve Outcome: Progressing   

## 2022-10-13 NOTE — Assessment & Plan Note (Signed)
Patient has history of chronic combined systolic and diastolic heart failure with an EF of 45-50% as measured by her most recent echocardiogram on 03/15/2022. She arrived with 1+ pitting edema in extremities and BNP 539, and received IV Lasix 60 mg x 1.  - Admit to FMTS, progressive care floor, attending Dr Pearlean Brownie. - Consider repeat echocardiogram - In/Out - Daily Weights - Keep K > 4.0, Mg > 2.0 - Lasix 40 mg IV at 8 AM

## 2022-10-13 NOTE — Discharge Summary (Signed)
Family Medicine Teaching [facility] Discharge Summary  Patient name: Abigail Wallace Medical record number: [id number] Date of birth: 02/12/1945 Age: 77 y.o. Gender: female Date of Admission: 10/12/2022  Date of Discharge: 10/13/2022 Admitting Physician: Margaretmary Dys, MD  Primary Care Provider: Westley Chandler, MD Consultants: None  Indication for Hospitalization: Heart Failure exacerbation  Brief Hospital Course:  RH is a 77yo F w/ hx of combined HF (EF 45-50% 02/2022), COPD, CKD 3b, T2DM, schizophrenia that was admitted for CHF exacerbation.   Acute on chronic combined systolic and diastolic heart failure Pt son called PCP (Dr. Manson Passey) regarding increased SOB, and was directed to ED. Found to have BNP 539, but otherwise satting well on RA. CXR showed evidence of vascular congestion. Pt was given IV lasix 60, with great UOP. Pt was discharged on Lasix 40 daily. At time of discharge, VSS on RA.   AKI vs progression of Chronic CKD Patient has stage 3b kidney disease, but on admission was found to have an elevated BUN (40) and Creatinine (2.02), above her baseline of (1.4-1.8). Plan to recheck outpt.   Chronic Indwelling Foley Pt has chronic indwelling foley, this was exchanged w/ new foley on 9/28.  Leukocytosis Incidentally noted to have WBC 13.8. Pt was afebrile and no evidence of infxn (no urinary symptoms, skin breakdown, or infiltrates on CXR). Recommend repeat CBC.   Prolonged QT interval Pt had prolonged QT on arrival to ED last night. A review of previous EKGs found a similar pattern. She is on a psych medication regimen that is considered lower risk for QT prolongation (zyprexa and valproic acid), but it is still possible that valproic acid may be contributing to her prolonged QT. Has an implanted pacemaker so risk of complications is lessened. - Avoid further QT prolonging agents.  Abdominal gas pain Pt had new onset pain that she said was gas pain  similar to what she has had at home. Was treated with simethicone and improved.   Chronic and Stable Problems:  Type 2 Diabetes Mellitus: Pt takes trajenta outpatient. A1c 9/28 is 6.2. COPD: albuterol (nebulizer), breo-ellipta, incruse ellipta, duoneb Paroxysmal atrial fibrillation: apixaban Hypertension: metoprolol, amlodidpine,  Bipolar Disorder / Paranoid Schizophrenia: zyprexa, valproic acid Mixed hyperlipidemia: pravastatin  Issues for follow-up with PCP: Discharged on Lasix 40mg  daily. Reassess if pt should continue or go back to every other day dosing.  BMP to reassess Cr  CBC to reassess incidental leukocytosis (no infectious symptoms so not started on antibiotics)   Disposition: home  Discharge Condition: Stable at her baseline   Discharge Exam:  General: Patient is an elderly obese woman laying in bed. She has a nasal cannula on that is not providing any oxygen at the time of the exam. She is somewhat anxious. Cardiovascular: Regular rate, rhythm regular by both auscultation and telemetry. No murmur was appreciated. No gallops or rubs. +1 pitting edema present bilaterally. Pulses symmetrical. Respiratory: High-pitched exhalation wheeze present and unchanged from last night. No crackles. Pt was breathing comfortably on room air without increased work of breathing. Confirmed that the nasal cannula was on but not giving oxygen at time of exam. Abdomen: Soft, non-distended. Non-tender to palpation. Difficult to assess fluid status due to habitus. Extremities: Pt has difficulty moving lower extremities, but this is reportedly near her baseline. Bilateral +1 pitting edema present on LE that goes no further than mid calf.   Significant Procedures: Na  Significant Labs and Imaging:  Recent Labs  Lab 10/12/22 2005  10/13/22 0411  WBC 13.8* 16.4*  HGB 10.8* 11.0*  HCT 34.0* 34.1*  PLT 226 226   Recent Labs  Lab 10/12/22 2005 10/13/22 0411  NA 135 133*  K 3.8 3.9  CL 100 102   CO2 20* 19*  GLUCOSE 182* 125*  BUN 40* 42*  CREATININE 2.02* 2.03*  CALCIUM 9.7 9.8  ALKPHOS 56  --   AST 14*  --   ALT 9  --   ALBUMIN 3.3*  --      Results/Tests Pending at Time of Discharge: NA  Discharge Medications:  Allergies as of 10/13/2022       Reactions   Aripiprazole Anaphylaxis, Other (See Comments), Shortness Of Breath   Tremors   Clozapine Anaphylaxis   Haloperidol Lactate Other (See Comments)   Severe tardive dyskinesias, somnolence, speech change    Benadryl [diphenhydramine Hcl] Other (See Comments)   States affects her vision   Benztropine Other (See Comments)   Affects mobility   Latuda [lurasidone Hcl] Other (See Comments)   "Tremors and shakes."    Lorazepam Other (See Comments)   Unknown reaction??   Remeron [mirtazapine] Other (See Comments)   Tremors and shakes   Keflex [cephalexin] Swelling, Other (See Comments)   Per patient, has tongue swelling with keflex but can tolerate amoxicillin   Codeine Other (See Comments)   Unknown reaction??   Latex Rash        Medication List     STOP taking these medications    amoxicillin 500 MG capsule Commonly known as: AMOXIL       TAKE these medications    acetaminophen 500 MG tablet Commonly known as: TYLENOL Take 1,000 mg by mouth every 6 (six) hours as needed for mild pain or headache.   albuterol 108 (90 Base) MCG/ACT inhaler Commonly known as: VENTOLIN HFA Inhale 2 puffs into the lungs every 6 (six) hours as needed for wheezing or shortness of breath.   allopurinol 100 MG tablet Commonly known as: ZYLOPRIM Take 1 tablet (100 mg total) by mouth daily.   amLODipine 10 MG tablet Commonly known as: NORVASC Take 1 tablet (10 mg total) by mouth daily.   apixaban 5 MG Tabs tablet Commonly known as: Eliquis Take 1 tablet (5 mg total) by mouth 2 (two) times daily.   cholecalciferol 25 MCG (1000 UNIT) tablet Commonly known as: VITAMIN D3 Take 1 tablet (1,000 Units total) by mouth  daily.   diclofenac Sodium 1 % Gel Commonly known as: Voltaren Apply 2 g topically 4 (four) times daily as needed (pain).   divalproex 250 MG DR tablet Commonly known as: DEPAKOTE Take 1 tablet (250 mg total) by mouth every 12 (twelve) hours.   furosemide 40 MG tablet Commonly known as: Lasix Take 1 tablet (40 mg total) by mouth daily. What changed: when to take this   hydrocortisone cream 0.5 % Apply 1 application topically 2 (two) times daily. Apply WITH Nystatin under left breast   Incontinence Supplies Misc 1 Units by Does not apply route as needed.   ipratropium-albuterol 0.5-2.5 (3) MG/3ML Soln Commonly known as: DUONEB Take 3 mLs by nebulization every 4 (four) hours as needed.   lactulose 10 GM/15ML solution Commonly known as: CHRONULAC Take 15 mLs (10 g total) by mouth 2 (two) times daily as needed for mild constipation.   Lancet Device Misc 1 Device by Does not apply route 3 (three) times a week.   metoprolol tartrate 100 MG tablet Commonly known as: LOPRESSOR Take  1 tablet (100 mg total) by mouth 3 (three) times daily as needed. What changed: when to take this   mupirocin cream 2 % Commonly known as: BACTROBAN Apply 1 application. topically 2 (two) times daily. What changed:  when to take this reasons to take this   nitroGLYCERIN 0.4 MG SL tablet Commonly known as: NITROSTAT Place 1 tablet (0.4 mg total) under the tongue every 5 (five) minutes as needed for chest pain.   nystatin powder Commonly known as: MYCOSTATIN/NYSTOP Apply 1 Application topically 3 (three) times daily.   OLANZapine 5 MG tablet Commonly known as: ZYPREXA Take 5 mg by mouth at bedtime.   OneTouch Delica Lancets 33G Misc Use to test once daily.  DX code:E11.9   OneTouch Verio test strip Generic drug: glucose blood Please use to check blood sugar once daily. E11.9   OneTouch Verio w/Device Kit Check blood sugar daily. E11.9   OVER THE COUNTER MEDICATION Apply 1  application  topically See admin instructions. Caldesene Medicated Protecting Powder- Apply under the breasts and between the skin folds 2 times a day as needed for irritation   pravastatin 40 MG tablet Commonly known as: PRAVACHOL TAKE ONE TABLET BY MOUTH EVERY EVENING   senna 8.6 MG Tabs tablet Commonly known as: SENOKOT Take 1 tablet (8.6 mg total) by mouth daily as needed for mild constipation.   sodium bicarbonate 650 MG tablet Take 1 tablet (650 mg total) by mouth 2 (two) times daily.   Tradjenta 5 MG Tabs tablet Generic drug: linagliptin TAKE ONE TABLET BY MOUTH DAILY   Trelegy Ellipta 100-62.5-25 MCG/ACT Aepb Generic drug: Fluticasone-Umeclidin-Vilant Inhale 1 puff into the lungs daily.   triamcinolone cream 0.1 % Commonly known as: KENALOG Apply 1 Application topically 2 (two) times daily. Apply to L ear       Discharge Instructions: Please refer to Patient Instructions section of EMR for full details.  Patient was counseled important signs and symptoms that should prompt return to medical care, changes in medications, dietary instructions, activity restrictions, and follow up appointments.   Follow-Up Appointments: f/u 10/22/2022 with Dr. Manson Passey (PCP)  Lincoln Brigham, MD 10/13/2022, 1:12 PM PGY-2, [facility] Family [facility]

## 2022-10-13 NOTE — Discharge Instructions (Addendum)
Dear Abigail Wallace,   Thank you so much for allowing [place] to be part of your care!  You were admitted to [facility] for fluid overload from your heart failure. We gave you extra doses of Lasix to help you pee off extra fluid, and you got better.  - Please take your Lasix 40mg  every day for the next 3 days. Then return to taking it every other day. This will help get rid of your extra fluid.  - Please follow-up with PCP Dr. Manson Passey 10/22/2022. We will check blood labs at this visit.   POST-HOSPITAL & CARE INSTRUCTIONS Please let PCP/Specialists know of any changes that were made.  Please see medications section of this packet for any medication changes.   DOCTOR'S APPOINTMENT & FOLLOW UP CARE INSTRUCTIONS  Future Appointments  Date Time Provider Department Center  10/22/2022 10:30 AM Westley Chandler, MD [facility]  11/08/2022 10:30 AM WL-CT 2 WL-CT [place]  12/24/2022  2:30 PM GI-BC[facility] DX DEXA 1 GI-BCGDG GI-BREAST [facility]  12/31/2022  7:05 AM [facility] DEVICE REMOTES [facility]  04/01/2023  7:05 AM [facility] DEVICE REMOTES [facility]  07/01/2023  7:05 AM [facility] DEVICE REMOTES [facility]    RETURN PRECAUTIONS: Please seek further medical attention if you: - have difficulty breathing - have worsening swelling of your legs - start having chest pain - start having fevers of 100.42F or higher  Take care and be well!  Family Medicine Teaching Service  [place]  [facility]  [address] [place], [place] [address] [phone]

## 2022-10-13 NOTE — Assessment & Plan Note (Signed)
Pt had new onset pain that she said was gas pain similar to what she has had at home. Requested something to help. Reviewed her medications for something that would not interfere with either her CKD or prolonged QT.  - Simethicone 160 mg (two chewable tablets) once

## 2022-10-13 NOTE — Progress Notes (Incomplete)
Daily Progress Note Intern Pager: [phone]  Patient name: Abigail Wallace Medical record number: [id number] Date of birth: [birth year] Age: 77 y.o. Gender: female  Primary Care Provider: Westley Chandler, MD Consultants: None Code Status: Full  Pt Overview and Major Events to Date:  Pt is a 77 yo female presenting with congestion, decreased activity, shortness of breath and edema in the setting of combined systolic/diastolic heart failure and COPD.   Assessment and Plan: Assessment & Plan Acute on chronic combined systolic and diastolic heart failure (HCC) Patient has history of chronic combined systolic and diastolic heart failure with an EF of 45-50% as measured by her most recent echocardiogram on 03/15/2022. She arrived with 1+ pitting edema in extremities and BNP 539, and received IV Lasix 60 mg x 1. She diuresed 2,475 ml of urine since admission.  - Admit to FMTS, progressive care floor, attending Dr Pearlean Brownie. - Consider repeat echocardiogram - In/Out - Daily Weights - Keep K > 4.0, Mg > 2.0 - Lasix 40 mg IV at 8 AM Acute kidney injury superimposed on chronic kidney disease (HCC) Patient has stage 3b kidney disease, but on admission was found to have an elevated BUN (40) and Creatinine (2.02), above her baseline of (1.4-1.6). Etiology could be secondary to volume overload.  - Continue outpatient sodium bicarbonate 650 mg BID - Continue diuresis outpatient, transition 40 mg IV lasix to 80 mg oral equivalent.  Well controlled type 2 diabetes mellitus (HCC) Patient's type II diabetes has been well controlled on trajenta (linagliptin). - Patient can take her home trajenta Prolonged QT interval Pt had prolonged QT on arrival to ED last night. A review of previous EKGs found a similar pattern. She is on a psych medication regimen that is considered lower risk for QT prolongation (zyprexa and valproic acid), but it is still possible that valproic acid may be contributing to her  prolonged QT. Has an implanted pacemaker so risk of complications is lessened. - Avoid further QT prolonging agents. Abdominal gas pain Pt had new onset pain that she said was gas pain similar to what she has had at home. Requested something to help. Reviewed her medications for something that would not interfere with either her CKD or prolonged QT.  - Simethicone 160 mg (two chewable tablets) once  Chronic and Stable Problems:  Type 2 Diabetes Mellitus: Pt takes trajenta outpatient. A1c 9/28 is 6.2. COPD: albuterol (nebulizer), breo-ellipta, incruse ellipta, duoneb Paroxysmal atrial fibrillation: apixaban Hypertension: metoprolol, amlodidpine,  Bipolar Disorder / Paranoid Schizophrenia: zyprexa, valproic acid Mixed hyperlipidemia: pravastatin   FEN/GI: HH/Carb modified diet VTE Prophylaxis: apixaban   FEN/GI: Carb modified PPx: On home eliquis Dispo:Home with home health pending clinical improvement . No major barriers expected.   Subjective:  Saw patient bedside this morning. She reported that she was feeling okay this morning but had not slept very well overnight. She reported gas pain that was new since admission that had been uncomfortable overnight. She requested something to help that. She reported that her breathing was about the same as yesterday. She requested whether she could go home later today as she felt that she would do better at home.  Objective: Temp:  [98.2 F (36.8 C)-99.9 F (37.7 C)] 98.4 F (36.9 C) (09/28 0440) Pulse Rate:  [58-93] 63 (09/28 0440) Resp:  [12-24] 20 (09/28 0440) BP: (90-178)/(65-98) 157/65 (09/28 0440) SpO2:  [92 %-99 %] 98 % (09/28 0440) Weight:  [135.2 kg-137.5 kg] 135.2 kg (09/28 0440)  Physical Exam:  General: Patient is an elderly obese woman laying in bed. She has a nasal cannula on that is not providing any oxygen at the time of the exam. She is somewhat anxious. Cardiovascular: Regular rate, rhythm regular by both auscultation and  telemetry. No murmur was appreciated. No gallops or rubs. +1 pitting edema present bilaterally. Pulses symmetrical. Respiratory: High-pitched exhalation wheeze present and unchanged from last night. No crackles. Pt was breathing comfortably on room air without increased work of breathing. Confirmed that the nasal cannula was on but not giving oxygen at time of exam. Abdomen: Soft, non-distended. Non-tender to palpation. Difficult to assess fluid status due to habitus. Extremities: Pt has difficulty moving lower extremities, but this is reportedly near her baseline. Bilateral +1 pitting edema present on LE that goes no further than mid calf.   Laboratory: Most recent CBC Lab Results  Component Value Date   WBC 16.4 (H) 10/13/2022   HGB 11.0 (L) 10/13/2022   HCT 34.1 (L) 10/13/2022   MCV 87.2 10/13/2022   PLT 226 10/13/2022   Most recent BMP    Latest Ref Rng & Units 10/13/2022    4:11 AM  BMP  Glucose 70 - 99 mg/dL 161   BUN 8 - 23 mg/dL 42   Creatinine 0.96 - 1.00 mg/dL 0.45   Sodium 409 - 811 mmol/L 133   Potassium 3.5 - 5.1 mmol/L 3.9   Chloride 98 - 111 mmol/L 102   CO2 22 - 32 mmol/L 19   Calcium 8.9 - 10.3 mg/dL 9.8     Other pertinent labs: Component Ref Range & Units 04:11 (10/13/22) 7 mo ago (02/26/22)  Hgb A1c MFr Bld 4.8 - 5.6 % 6.2 High  5.8 High  CM   Slight uptick in A1c.  Imaging/Diagnostic Tests: No interval exams  Margaretmary Dys, MD 10/13/2022, 6:10 AM  PGY-1, [facility] Family Medicine FPTS Intern pager: [phone], text pages welcome Secure chat group [facility] Teaching Service

## 2022-10-13 NOTE — Hospital Course (Addendum)
RH is a 77yo F w/ hx of combined HF (EF 45-50% 02/2022), COPD, CKD 3b, T2DM, schizophrenia that was admitted for CHF exacerbation.   Acute on chronic combined systolic and diastolic heart failure Pt son called PCP (Dr. Manson Passey) regarding increased SOB, and was directed to ED. Found to have BNP 539, but otherwise satting well on RA. CXR showed evidence of vascular congestion. Pt was given IV lasix 60, with great UOP. Pt was discharged on Lasix 40 daily. At time of discharge, VSS on RA.   AKI vs progression of Chronic CKD Patient has stage 3b kidney disease, but on admission was found to have an elevated BUN (40) and Creatinine (2.02), above her baseline of (1.4-1.8). Plan to recheck outpt.   Chronic Indwelling Foley Pt has chronic indwelling foley, this was exchanged w/ new foley on 9/28.  Leukocytosis Incidentally noted to have WBC 13.8. Pt was afebrile and no evidence of infxn (no urinary symptoms, skin breakdown, or infiltrates on CXR). Recommend repeat CBC.   Prolonged QT interval Pt had prolonged QT on arrival to ED last night. A review of previous EKGs found a similar pattern. She is on a psych medication regimen that is considered lower risk for QT prolongation (zyprexa and valproic acid), but it is still possible that valproic acid may be contributing to her prolonged QT. Has an implanted pacemaker so risk of complications is lessened. - Avoid further QT prolonging agents.  Abdominal gas pain Pt had new onset pain that she said was gas pain similar to what she has had at home. Was treated with simethicone and improved.   Chronic and Stable Problems:  Type 2 Diabetes Mellitus: Pt takes trajenta outpatient. A1c 9/28 is 6.2. COPD: albuterol (nebulizer), breo-ellipta, incruse ellipta, duoneb Paroxysmal atrial fibrillation: apixaban Hypertension: metoprolol, amlodidpine,  Bipolar Disorder / Paranoid Schizophrenia: zyprexa, valproic acid Mixed hyperlipidemia: pravastatin  Issues for  follow-up with PCP: Discharged on Lasix 40mg  daily. Reassess if pt should continue or go back to every other day dosing.  BMP to reassess Cr  CBC to reassess incidental leukocytosis (no infectious symptoms so not started on antibiotics)

## 2022-10-13 NOTE — Assessment & Plan Note (Addendum)
Patient has stage 3b kidney disease, but on admission was found to have an elevated BUN (40) and Creatinine (2.02), above her baseline of (1.4-1.6). Etiology could be secondary to volume overload.  - Continue outpatient sodium bicarbonate 650 mg BID - Continue diuresis outpatient, transition 40 mg IV lasix to 80 mg oral equivalent.

## 2022-10-13 NOTE — Care Management Obs Status (Signed)
MEDICARE OBSERVATION STATUS NOTIFICATION   Patient Details  Name: Abigail Wallace MRN: [id number] Date of Birth: Oct 21, 1945   Medicare Observation Status Notification Given:  Yes    Isaias Cowman, RN 10/13/2022, 2:42 PM

## 2022-10-13 NOTE — Care Management CC44 (Signed)
Condition Code 44 Documentation Completed  Patient Details  Name: Abigail Wallace MRN: [id number] Date of Birth: August 12, 1945   Condition Code 44 given:  Yes Patient signature on Condition Code 44 notice:  Yes Documentation of 2 MD's agreement:  Yes Code 44 added to claim:  Yes    Isaias Cowman, RN 10/13/2022, 2:43 PM

## 2022-10-13 NOTE — Assessment & Plan Note (Addendum)
Pt had prolonged QT on arrival to ED last night. A review of previous EKGs found a similar pattern. She is on a psych medication regimen that is considered lower risk for QT prolongation (zyprexa and valproic acid), but it is still possible that valproic acid may be contributing to her prolonged QT. Has an implanted pacemaker so risk of complications is lessened. - Avoid further QT prolonging agents.

## 2022-10-13 NOTE — Assessment & Plan Note (Addendum)
Patient has history of chronic combined systolic and diastolic heart failure with an EF of 45-50% as measured by her most recent echocardiogram on 03/15/2022. She arrived with 1+ pitting edema in extremities and BNP 539, and received IV Lasix 60 mg x 1. She diuresed 2,475 ml of urine since admission.  - Admit to FMTS, progressive care floor, attending Dr Pearlean Brownie. - Consider repeat echocardiogram - In/Out - Daily Weights - Keep K > 4.0, Mg > 2.0 - Lasix 40 mg IV at 8 AM

## 2022-10-13 NOTE — Progress Notes (Signed)
Received referral to assist with PTAR. Met with pt and son Abigail Wallace). Pt is non-ambulatory and they are requesting an ambulance for transportation. Verified home address of [address], [place], [place],  [place] [address]. PTAR has been arranged.

## 2022-10-13 NOTE — ED Notes (Signed)
ED TO INPATIENT HANDOFF REPORT  ED Nurse Name and Phone #: Ocie Cornfield  S Name/Age/Gender Abigail Wallace 77 y.o. female Room/Bed: 023C/023C  Code Status   Code Status: Full Code  Home/SNF/Other Home Patient oriented to: self Is this baseline? Yes   Triage Complete: Triage complete  Chief Complaint Acute on chronic congestive heart failure (HCC) [I50.9]  Triage Note Pt arrived via EMS from home. EMS reports pts doctor called and requested pt be transported to hospital for abnormal labs and possible fluid overload. Pt reports recent increase in shortness in breath. Pt states she wears 2L [place] as needed.    Allergies Allergies  Allergen Reactions   Aripiprazole Anaphylaxis, Other (See Comments) and Shortness Of Breath    Tremors   Clozapine Anaphylaxis   Haloperidol Lactate Other (See Comments)    Severe tardive dyskinesias, somnolence, speech change    Benadryl [Diphenhydramine Hcl] Other (See Comments)    States affects her vision   Benztropine Other (See Comments)    Affects mobility   Latuda [Lurasidone Hcl] Other (See Comments)    "Tremors and shakes."    Lorazepam Other (See Comments)    Unknown reaction??   Remeron [Mirtazapine] Other (See Comments)    Tremors and shakes   Keflex [Cephalexin] Swelling and Other (See Comments)    Per patient, has tongue swelling with keflex but can tolerate amoxicillin   Codeine Other (See Comments)    Unknown reaction??   Latex Rash    Level of Care/Admitting Diagnosis ED Disposition     ED Disposition  Admit   Condition  --   Comment  Hospital Area: [facility] [[id number]]  Level of Care: Progressive [102]  Admit to Progressive based on following criteria: RESPIRATORY PROBLEMS hypoxemic/hypercapnic respiratory failure that is responsive to NIPPV (BiPAP) or High Flow Nasal Cannula (6-80 lpm). Frequent assessment/intervention, no > Q2 hrs < Q4 hrs, to maintain oxygenation and pulmonary hygiene.  Admit to  Progressive based on following criteria: CARDIOVASCULAR & THORACIC of moderate stability with acute coronary syndrome symptoms/low risk myocardial infarction/hypertensive urgency/arrhythmias/heart failure potentially compromising stability and stable post cardiovascular intervention patients.  May admit patient to [facility] or [facility] if equivalent level of care is available:: No  Covid Evaluation: Asymptomatic - no recent exposure (last 10 days) testing not required  Diagnosis: Acute on chronic congestive heart failure [facility]) [[id number]]  Admitting Physician: Tommy Rainwater Lovey Newcomer [[id number]]  Attending Physician: Carney Living [phone]  Certification:: I certify there are rare and unusual circumstances requiring inpatient admission  Expected Medical Readiness: 10/13/2022          B Medical/Surgery History Past Medical History:  Diagnosis Date   Abdominal wall hernia 01/2017   Advanced care planning/counseling discussion 09/29/2021   Atrial fibrillation (HCC)    CHF (congestive heart failure) (HCC)    CKD (chronic kidney disease)    COPD (chronic obstructive pulmonary disease) (HCC)    Coronary artery disease    NON-CRITICAL   Ectasis aorta (HCC)    GERD (gastroesophageal reflux disease)    Gout    Hypertension    Memory difficulties 12/02/2013   Pacemaker    Psychosis (HCC)    Tardive dyskinesia 12/02/2013   Type 2 diabetes mellitus (HCC)    Past Surgical History:  Procedure Laterality Date   CARDIAC CATHETERIZATION  2007   Non-critical.    CARDIOVERSION N/A 01/27/2021   Procedure: CARDIOVERSION;  Surgeon: Sande Rives, MD;  Location: [facility] ENDOSCOPY;  Service:  Cardiovascular;  Laterality: N/A;   CHOLECYSTECTOMY     ELBOW SURGERY     EP IMPLANTABLE DEVICE N/A 08/31/2014   Procedure: PPM Generator Changeout;  Surgeon: Thurmon Fair, MD;  Location: [facility] INVASIVE CV LAB;  Service: Cardiovascular;  Laterality: N/A;   INSERT / REPLACE / REMOVE PACEMAKER   2007   [place]/SYMPTOMATIC HEART BLOCK   PACEMAKER PLACEMENT  09/28/2005   2/2 SSS?   Persantine stress test  05/01/2010   EF 66%. Normal LV sys fx. Unchanged from previous studies.    TRANSTHORACIC ECHOCARDIOGRAM  09/08/10   SEVERE CONCENTRIC HYPERTROPHY.LV FUNCTION WAS VIGOROUS.EF 65%-70%.VENTRICULAR SEPTUM-INCOORDINATE MOTION.LEFT ATRIUM-MILDLY DILATED.TRIVIAL TR.   TUBAL LIGATION       A IV Location/Drains/Wounds Patient Lines/Drains/Airways Status     Active Line/Drains/Airways     Name Placement date Placement time Site Days   Peripheral IV 10/12/22 20 G Left Antecubital 10/12/22  2151  Antecubital  1   Urethral Catheter Alvino Chapel RN Latex 03/16/22  1656  Latex  211            Intake/Output Last 24 hours  Intake/Output Summary (Last 24 hours) at 10/13/2022 0032 Last data filed at 10/12/2022 2204 Gross per 24 hour  Intake --  Output 650 ml  Net -650 ml    Labs/Imaging Results for orders placed or performed during the hospital encounter of 10/12/22 (from the past 48 hour(s))  CBC with Differential     Status: Abnormal   Collection Time: 10/12/22  8:05 PM  Result Value Ref Range   WBC 13.8 (H) 4.0 - 10.5 K/uL   RBC 3.92 3.87 - 5.11 MIL/uL   Hemoglobin 10.8 (L) 12.0 - 15.0 g/dL   HCT 16.1 (L) 09.6 - 04.5 %   MCV 86.7 80.0 - 100.0 fL   MCH 27.6 26.0 - 34.0 pg   MCHC 31.8 30.0 - 36.0 g/dL   RDW 40.9 (H) 81.1 - 91.4 %   Platelets 226 150 - 400 K/uL   nRBC 0.0 0.0 - 0.2 %   Neutrophils Relative % 67 %   Neutro Abs 9.3 (H) 1.7 - 7.7 K/uL   Lymphocytes Relative 21 %   Lymphs Abs 2.9 0.7 - 4.0 K/uL   Monocytes Relative 6 %   Monocytes Absolute 0.8 0.1 - 1.0 K/uL   Eosinophils Relative 2 %   Eosinophils Absolute 0.3 0.0 - 0.5 K/uL   Basophils Relative 1 %   Basophils Absolute 0.1 0.0 - 0.1 K/uL   Immature Granulocytes 3 %   Abs Immature Granulocytes 0.37 (H) 0.00 - 0.07 K/uL    Comment: Performed at [facility], [address] [place], [place] [address]   Comprehensive metabolic panel     Status: Abnormal   Collection Time: 10/12/22  8:05 PM  Result Value Ref Range   Sodium 135 135 - 145 mmol/L   Potassium 3.8 3.5 - 5.1 mmol/L   Chloride 100 98 - 111 mmol/L   CO2 20 (L) 22 - 32 mmol/L   Glucose, Bld 182 (H) 70 - 99 mg/dL    Comment: Glucose reference range applies only to samples taken after fasting for at least 8 hours.   BUN 40 (H) 8 - 23 mg/dL   Creatinine, Ser 6.21 (H) 0.44 - 1.00 mg/dL   Calcium 9.7 8.9 - 30.8 mg/dL   Total Protein 7.0 6.5 - 8.1 g/dL   Albumin 3.3 (L) 3.5 - 5.0 g/dL   AST 14 (L) 15 - 41 U/L  ALT 9 0 - 44 U/L   Alkaline Phosphatase 56 38 - 126 U/L   Total Bilirubin 0.4 0.3 - 1.2 mg/dL   GFR, Estimated 25 (L) >60 mL/min    Comment: (NOTE) Calculated using the CKD-EPI Creatinine Equation (2021)    Anion gap 15 5 - 15    Comment: Performed at [facility], [address] [place], [place] [address]  Troponin I (High Sensitivity)     Status: None   Collection Time: 10/12/22  8:05 PM  Result Value Ref Range   Troponin I (High Sensitivity) 13 <18 ng/L    Comment: (NOTE) Elevated high sensitivity troponin I (hsTnI) values and significant  changes across serial measurements may suggest ACS but many other  chronic and acute conditions are known to elevate hsTnI results.  Refer to the "Links" section for chest pain algorithms and additional  guidance. Performed at [facility], [address] [place], [place] [address]   Brain natriuretic peptide     Status: Abnormal   Collection Time: 10/12/22  8:05 PM  Result Value Ref Range   B Natriuretic Peptide 539.3 (H) 0.0 - 100.0 pg/mL    Comment: Performed at [facility], [address], [place], [place] [address]  Troponin I (High Sensitivity)     Status: None   Collection Time: 10/12/22  9:53 PM  Result Value Ref Range   Troponin I (High Sensitivity) 14 <18 ng/L    Comment: (NOTE) Elevated high sensitivity troponin I (hsTnI) values and  significant  changes across serial measurements may suggest ACS but many other  chronic and acute conditions are known to elevate hsTnI results.  Refer to the "Links" section for chest pain algorithms and additional  guidance. Performed at [facility], [address] [place], [place] [address]    *Note: Due to a large number of results and/or encounters for the requested time period, some results have not been displayed. A complete set of results can be found in Results Review.   DG Chest Portable 1 View  Result Date: 10/12/2022 CLINICAL DATA:  Shortness of breath EXAM: PORTABLE CHEST 1 VIEW COMPARISON:  03/04/2022 FINDINGS: Left pacer remains in place, unchanged. Cardiomegaly, vascular congestion. No confluent airspace opacity or effusion. No acute bony abnormality. IMPRESSION: Cardiomegaly, vascular congestion. Electronically Signed   By: Charlett Nose M.D.   On: 10/12/2022 20:23    Pending Labs Unresulted Labs (From admission, onward)     Start     Ordered   10/13/22 0500  Basic metabolic panel  Tomorrow morning,   R        10/12/22 2339   10/13/22 0500  CBC  Tomorrow morning,   R        10/12/22 2339            Vitals/Pain Today's Vitals   10/12/22 2330 10/12/22 2359 10/12/22 2359 10/13/22 0004  BP: (!) 137/98 (!) 146/67 (!) 146/67   Pulse: 60 71 70   Resp: 18     Temp:      TempSrc:      SpO2: 97%     Weight:      Height:      PainSc:    4     Isolation Precautions No active isolations  Medications Medications  acetaminophen (TYLENOL) tablet 1,000 mg (has no administration in time range)  allopurinol (ZYLOPRIM) tablet 100 mg (has no administration in time range)  amLODipine (NORVASC) tablet 10 mg (  has no administration in time range)  metoprolol tartrate (LOPRESSOR) tablet 150 mg (150 mg Oral Given 10/12/22 2359)  nitroGLYCERIN (NITROSTAT) SL tablet 0.4 mg (has no administration in time range)  pravastatin (PRAVACHOL) tablet 40 mg (has no  administration in time range)  OLANZapine (ZYPREXA) tablet 5 mg (5 mg Oral Given 10/12/22 2354)  senna (SENOKOT) tablet 8.6 mg (has no administration in time range)  sodium bicarbonate tablet 650 mg (650 mg Oral Given 10/12/22 2358)  apixaban (ELIQUIS) tablet 5 mg (5 mg Oral Given 10/12/22 2352)  divalproex (DEPAKOTE) DR tablet 250 mg (250 mg Oral Given 10/12/22 2353)  cholecalciferol (VITAMIN D3) 25 MCG (1000 UNIT) tablet 1,000 Units (has no administration in time range)  albuterol (PROVENTIL) (2.5 MG/3ML) 0.083% nebulizer solution 3 mL (has no administration in time range)  fluticasone furoate-vilanterol (BREO ELLIPTA) 100-25 MCG/ACT 1 puff (has no administration in time range)    And  umeclidinium bromide (INCRUSE ELLIPTA) 62.5 MCG/ACT 1 puff (has no administration in time range)  ipratropium-albuterol (DUONEB) 0.5-2.5 (3) MG/3ML nebulizer solution 3 mL (has no administration in time range)  diclofenac Sodium (VOLTAREN) 1 % topical gel 2 g (has no administration in time range)  acetaminophen (TYLENOL) tablet 1,000 mg (1,000 mg Oral Given 10/12/22 2118)  furosemide (LASIX) injection 60 mg (60 mg Intravenous Given 10/12/22 2154)    Mobility non-ambulatory     Focused Assessments    R Recommendations: See Admitting Provider Note  Report given to:   Additional Notes:

## 2022-10-13 NOTE — Assessment & Plan Note (Addendum)
Patient's type II diabetes has been well controlled on trajenta (linagliptin). - Patient can take her home trajenta

## 2022-10-15 ENCOUNTER — Encounter: Payer: Self-pay | Admitting: Family Medicine

## 2022-10-15 NOTE — Telephone Encounter (Signed)
Nursing- Patient had recent admission  [facility]  Patient needs new PT order Please draw BMP on 10/3 or 10/4   Thank you Terisa Starr, MD  [facility] Medicine Teaching Service

## 2022-10-16 DIAGNOSIS — E1122 Type 2 diabetes mellitus with diabetic chronic kidney disease: Secondary | ICD-10-CM | POA: Diagnosis not present

## 2022-10-16 DIAGNOSIS — I130 Hypertensive heart and chronic kidney disease with heart failure and stage 1 through stage 4 chronic kidney disease, or unspecified chronic kidney disease: Secondary | ICD-10-CM | POA: Diagnosis not present

## 2022-10-16 DIAGNOSIS — D631 Anemia in chronic kidney disease: Secondary | ICD-10-CM | POA: Diagnosis not present

## 2022-10-16 DIAGNOSIS — N184 Chronic kidney disease, stage 4 (severe): Secondary | ICD-10-CM | POA: Diagnosis not present

## 2022-10-16 DIAGNOSIS — Z466 Encounter for fitting and adjustment of urinary device: Secondary | ICD-10-CM | POA: Diagnosis not present

## 2022-10-16 DIAGNOSIS — I5043 Acute on chronic combined systolic (congestive) and diastolic (congestive) heart failure: Secondary | ICD-10-CM | POA: Diagnosis not present

## 2022-10-17 NOTE — Progress Notes (Signed)
Remote pacemaker transmission.   

## 2022-10-22 ENCOUNTER — Ambulatory Visit: Payer: 59 | Admitting: Family Medicine

## 2022-10-23 ENCOUNTER — Other Ambulatory Visit: Payer: Self-pay

## 2022-10-23 ENCOUNTER — Telehealth: Payer: Self-pay

## 2022-10-23 DIAGNOSIS — I480 Paroxysmal atrial fibrillation: Secondary | ICD-10-CM

## 2022-10-23 DIAGNOSIS — B91 Sequelae of poliomyelitis: Secondary | ICD-10-CM

## 2022-10-23 DIAGNOSIS — I5043 Acute on chronic combined systolic (congestive) and diastolic (congestive) heart failure: Secondary | ICD-10-CM

## 2022-10-23 DIAGNOSIS — N1832 Chronic kidney disease, stage 3b: Secondary | ICD-10-CM

## 2022-10-23 DIAGNOSIS — L304 Erythema intertrigo: Secondary | ICD-10-CM

## 2022-10-23 MED ORDER — NYSTATIN 100000 UNIT/GM EX POWD
1.0000 | Freq: Three times a day (TID) | CUTANEOUS | 1 refills | Status: DC
Start: 2022-10-23 — End: 2023-06-15

## 2022-10-23 NOTE — Telephone Encounter (Signed)
Home health referral placed.  Unfortunately, my schedule does not allow for home visits this month.  Please let Mr. [name] know.  Terisa Starr, MD  Family Medicine Teaching Service

## 2022-10-23 NOTE — Telephone Encounter (Signed)
Son calls nurse line for multiple concerns.   (1) He is requesting a home visit from PCP if able this month.    (2) He reports her HH PT has expired. He is requesting a new order to be sent to [facility].   Will forward to PCP to address.

## 2022-10-24 NOTE — Telephone Encounter (Signed)
Spoke with patient's son Clover Mealy) he has been informed. Since patient is taking Lasix [name] wanted to ask when you will check her labs, every month or two? [name] explained he has talk to you about this before but forgot to take notes due to him being sick. Penni Bombard CMA

## 2022-10-24 NOTE — Telephone Encounter (Signed)
Called son. Discussed patient care etc. She is good spirits.   Nursing  Please call [name] patient needs CBC and CMP sometime this week  Thank you Terisa Starr, MD  [facility] Medicine Teaching Service

## 2022-10-25 ENCOUNTER — Encounter: Payer: Self-pay | Admitting: Family Medicine

## 2022-10-26 NOTE — Telephone Encounter (Signed)
Called and LVM with [name] for [name] regarding PT order.   Will await response.   Veronda Prude, RN

## 2022-10-26 NOTE — Telephone Encounter (Signed)
*  Delay in documentation*  Spoke with [name] at [facility] yesterday and gave verbal orders for labs.   Veronda Prude, RN

## 2022-10-29 ENCOUNTER — Encounter: Payer: Self-pay | Admitting: Family Medicine

## 2022-10-31 DIAGNOSIS — E1122 Type 2 diabetes mellitus with diabetic chronic kidney disease: Secondary | ICD-10-CM | POA: Diagnosis not present

## 2022-10-31 DIAGNOSIS — I130 Hypertensive heart and chronic kidney disease with heart failure and stage 1 through stage 4 chronic kidney disease, or unspecified chronic kidney disease: Secondary | ICD-10-CM | POA: Diagnosis not present

## 2022-10-31 DIAGNOSIS — D631 Anemia in chronic kidney disease: Secondary | ICD-10-CM | POA: Diagnosis not present

## 2022-10-31 DIAGNOSIS — Z466 Encounter for fitting and adjustment of urinary device: Secondary | ICD-10-CM | POA: Diagnosis not present

## 2022-10-31 DIAGNOSIS — N184 Chronic kidney disease, stage 4 (severe): Secondary | ICD-10-CM | POA: Diagnosis not present

## 2022-10-31 DIAGNOSIS — I5043 Acute on chronic combined systolic (congestive) and diastolic (congestive) heart failure: Secondary | ICD-10-CM | POA: Diagnosis not present

## 2022-11-01 ENCOUNTER — Telehealth: Payer: Self-pay

## 2022-11-01 MED ORDER — NYSTATIN 100000 UNIT/GM EX CREA: 1.0000 | TOPICAL_CREAM | Freq: Two times a day (BID) | CUTANEOUS | 3 refills | Status: DC

## 2022-11-01 NOTE — Telephone Encounter (Signed)
Great! Admin team--can you schedule this patient with me on the PM of October 31 at 330 in the blocked spots for her visit?  Thanks so much Terisa Starr, MD  [facility] Medicine Teaching Service

## 2022-11-01 NOTE — Telephone Encounter (Signed)
Patient LVM on nurse line requesting a refill on Nystatin Powder.   I called [name] in regards to request as we sent this prescription in on 10/8.   [name] reports they have plenty of the Nystatin Powder, however he is requesting Nystatin Cream. He reports he uses this intermittently for "spots" on her legs.   He reports [name] came out yesterday, however had multiple failed attempts at drawing her blood. He reports he is unsure when they are planning to come back for redraw. I advised [name] I would call them for an update.  Advised will send to PCP for medication advisement.

## 2022-11-01 NOTE — Telephone Encounter (Signed)
Called [name] in regards to [facility] PT orders.   Abigail Wallace reports they never received the order for PT. Order was placed on 10/8 by PCP.  Verbal order given to Abigail Wallace for [facility] PT evaluation.  Will update [name].

## 2022-11-01 NOTE — Telephone Encounter (Signed)
Spoke with [name] in regards to nursing visit for blood work.   [name] reports they are "actively" trying to find a HH RN to go today or tomorrow for redraw.  Spoke with [name] and advised of this.   [name] is agreeable to home visit with PCP on 10/31.

## 2022-11-01 NOTE — Telephone Encounter (Signed)
Rx to pharmacy. If they call back, please ask about home visit on 10/31 (see mychart).  Terisa Starr, MD  Family Medicine Teaching Service

## 2022-11-02 DIAGNOSIS — I130 Hypertensive heart and chronic kidney disease with heart failure and stage 1 through stage 4 chronic kidney disease, or unspecified chronic kidney disease: Secondary | ICD-10-CM | POA: Diagnosis not present

## 2022-11-02 DIAGNOSIS — N184 Chronic kidney disease, stage 4 (severe): Secondary | ICD-10-CM | POA: Diagnosis not present

## 2022-11-02 DIAGNOSIS — E1122 Type 2 diabetes mellitus with diabetic chronic kidney disease: Secondary | ICD-10-CM | POA: Diagnosis not present

## 2022-11-02 DIAGNOSIS — D631 Anemia in chronic kidney disease: Secondary | ICD-10-CM | POA: Diagnosis not present

## 2022-11-02 DIAGNOSIS — Z466 Encounter for fitting and adjustment of urinary device: Secondary | ICD-10-CM | POA: Diagnosis not present

## 2022-11-02 DIAGNOSIS — I5043 Acute on chronic combined systolic (congestive) and diastolic (congestive) heart failure: Secondary | ICD-10-CM | POA: Diagnosis not present

## 2022-11-02 NOTE — Telephone Encounter (Signed)
I have added to schedule.   Thank you Nehemiah Settle

## 2022-11-03 DIAGNOSIS — J811 Chronic pulmonary edema: Secondary | ICD-10-CM | POA: Diagnosis not present

## 2022-11-07 DIAGNOSIS — I5043 Acute on chronic combined systolic (congestive) and diastolic (congestive) heart failure: Secondary | ICD-10-CM | POA: Diagnosis not present

## 2022-11-07 DIAGNOSIS — I130 Hypertensive heart and chronic kidney disease with heart failure and stage 1 through stage 4 chronic kidney disease, or unspecified chronic kidney disease: Secondary | ICD-10-CM | POA: Diagnosis not present

## 2022-11-07 DIAGNOSIS — Z466 Encounter for fitting and adjustment of urinary device: Secondary | ICD-10-CM | POA: Diagnosis not present

## 2022-11-07 DIAGNOSIS — D631 Anemia in chronic kidney disease: Secondary | ICD-10-CM | POA: Diagnosis not present

## 2022-11-07 DIAGNOSIS — E1122 Type 2 diabetes mellitus with diabetic chronic kidney disease: Secondary | ICD-10-CM | POA: Diagnosis not present

## 2022-11-07 DIAGNOSIS — N184 Chronic kidney disease, stage 4 (severe): Secondary | ICD-10-CM | POA: Diagnosis not present

## 2022-11-08 ENCOUNTER — Ambulatory Visit (HOSPITAL_COMMUNITY): Admission: RE | Admit: 2022-11-08 | Payer: 59 | Source: Ambulatory Visit

## 2022-11-09 DIAGNOSIS — R338 Other retention of urine: Secondary | ICD-10-CM | POA: Diagnosis not present

## 2022-11-10 DIAGNOSIS — R338 Other retention of urine: Secondary | ICD-10-CM | POA: Diagnosis not present

## 2022-11-12 ENCOUNTER — Telehealth: Payer: Self-pay

## 2022-11-12 NOTE — Telephone Encounter (Signed)
Liji from [facility] calling for PT verbal orders as follows:  2 time(s) weekly for 3 week(s), then 1 time(s) weekly for 3 week(s)  Verbal orders given per [facility] protocol  Talbot Grumbling, RN

## 2022-11-14 NOTE — Progress Notes (Unsigned)
    SUBJECTIVE:  This visited was conducted in the patient's home.  CHIEF COMPLAINT: leg wound, dysuria, not feeling well HPI:   Abigail Wallace is a 77 y.o.  with history notable for CKD, type 2 DM, heart failure with reduced EF, atrial fibrillation with pacemaker on Eliquis, and Foley catheter dependence (due to prior retention and now for wound prevention) presenting for follow up at home.  Her son is out the home at work. He participates via phone. I also spoke with him prior to the visit. Exam chaperoned by nurse aid, [place].   Mr. Ernest Mallick reports his mom's mood is more irritable. She is taking her medications as prescribed though wishes to lower her anti-psychotic dosing. She continues to follow up with Psychiatry Jo---has been doing virtual visits.  The patient reports a 'sore' on L inner thigh. This is healing. Applying nystatin and kenalog. NO fevers or drainage.  She reports breathing is at baseline. Continues to have L>R edema. Urine output excellent. Taking Lasix 40 mg daily. She is able to lay down in bed. Only using oxygen as needed. She is taking her eliquis and other medications for atrial fibrillation/heart failure as prescribed.   The patients home BP range from 130-150/80s. She is compliant with her medications including TID metoprolol.  The patients CBG have ranged 117 (14 day average) and 122 (7 day average). She is on Trajenta.   The patient's main complaint today is related to her left ear and urine.  She reports dysuria, foul smelling uring, and pain from Foley catheter. Eating and drinking normally. No fevers or flank pain.  She reports L ear drainage for some time. Also mild pain. Has a known cholesteatoma on L ear before.   PERTINENT  PMH / PSH/Family/Social History : CKD IIIB, follows with Nephrology Follows with Dr. Sande Brothers at [facility]   OBJECTIVE:   BP 138/70   Pulse 70   Resp 14   SpO2 99%   Today's weight:  Review of prior weights: Irregular  rate and rhythm Lungs clear bilaterally---no wheezes today On L thigh 4 cm posterior to foley placement there is 0.5 cm healing small superficial ulcer without surrounding erythema or drainage L>R edema No otoscope available--erythema of L external auditory canal  ASSESSMENT/PLAN:   Assessment & Plan Chronic otitis externa of left ear, unspecified type Ciprodex given drainage  Cholesteatoma of left ear Possibly cause of L ear drainage Given erythema and pain will treat with ciprodex Patient requests PO antibiotics Discussed risks, side effects Referral to ENT  Well controlled type 2 diabetes mellitus (HCC) Blood glucoses well controlled Rx new glucometer  Urinary retention Has indwelling Foley catheter given history Needs appropriate supplies to help maintain function  DME ordered for bag, safety lock, and alcohol pads  Dysuria Given multitude of symptoms will obtain culture Monitor for fever, change in symptoms   L Thigh Superficial Ulcer Likely related to Foley and contact Apply vaseline, discontinue Kenalog  Monitor at follow up (has progressively gotten smaller)    At next visit- take otoscope, urine culture with Foley change next week, labs week of 11/11  A1C at end of December  Terisa Starr, MD  Family Medicine Teaching Service  [facility]

## 2022-11-15 ENCOUNTER — Other Ambulatory Visit: Payer: 59 | Admitting: Family Medicine

## 2022-11-15 ENCOUNTER — Encounter: Payer: Self-pay | Admitting: Family Medicine

## 2022-11-15 ENCOUNTER — Telehealth: Payer: Self-pay | Admitting: Family Medicine

## 2022-11-15 VITALS — BP 138/70 | HR 70 | Resp 14

## 2022-11-15 DIAGNOSIS — H7192 Unspecified cholesteatoma, left ear: Secondary | ICD-10-CM | POA: Diagnosis not present

## 2022-11-15 DIAGNOSIS — R339 Retention of urine, unspecified: Secondary | ICD-10-CM | POA: Diagnosis not present

## 2022-11-15 DIAGNOSIS — E119 Type 2 diabetes mellitus without complications: Secondary | ICD-10-CM

## 2022-11-15 DIAGNOSIS — H6062 Unspecified chronic otitis externa, left ear: Secondary | ICD-10-CM

## 2022-11-15 MED ORDER — ONETOUCH VERIO FLEX SYSTEM W/DEVICE KIT: PACK | 0 refills | Status: DC

## 2022-11-15 MED ORDER — ONETOUCH VERIO VI STRP: ORAL_STRIP | 12 refills | Status: DC

## 2022-11-15 MED ORDER — CIPROFLOXACIN-DEXAMETHASONE 0.3-0.1 % OT SUSP: 4.0000 [drp] | Freq: Two times a day (BID) | OTIC | 0 refills | Status: AC

## 2022-11-15 NOTE — Assessment & Plan Note (Signed)
Has indwelling Foley catheter given history Needs appropriate supplies to help maintain function  DME ordered for bag, safety lock, and alcohol pads

## 2022-11-15 NOTE — Assessment & Plan Note (Addendum)
Blood glucoses well controlled Rx new glucometer

## 2022-11-15 NOTE — Telephone Encounter (Signed)
RN team  I ordered DME. Can you send to [facility]? Son wants specific brand of foley bags Please call [facility] Week of 11/4 she is due for foley change. Please ask them to obtain urine culture off NEW Foley they place next week.  Week of 11/11. Needs the following labs: CMP, CBC.   Please let me know if questions  Thanks so much  CB

## 2022-11-15 NOTE — Assessment & Plan Note (Addendum)
Possibly cause of L ear drainage Given erythema and pain will treat with ciprodex Patient requests PO antibiotics Discussed risks, side effects Referral to ENT

## 2022-11-17 ENCOUNTER — Other Ambulatory Visit: Payer: Self-pay | Admitting: Cardiovascular Disease

## 2022-11-17 MED ORDER — FUROSEMIDE 40 MG PO TABS: 40.0000 mg | ORAL_TABLET | Freq: Every day | ORAL | 3 refills | Status: DC

## 2022-11-17 NOTE — Addendum Note (Signed)
Addended by: Manson Passey, Jabreel Chimento on: 11/17/2022 07:41 PM   Modules accepted: Orders

## 2022-11-19 ENCOUNTER — Telehealth: Payer: Self-pay | Admitting: Family Medicine

## 2022-11-19 NOTE — Telephone Encounter (Signed)
Returned call to [place].   Gave verbal approval to collect on 11/26/22.  Veronda Prude, RN

## 2022-11-19 NOTE — Telephone Encounter (Signed)
Dee from [facility] calls nurse line in regards to order for UA.   She reports the patients last cath change was on 10/16 and therefore will not be due until 11/11.  Is waiting until 11/11 ok for UA?  Will forward to PCP.   [phone].

## 2022-11-19 NOTE — Telephone Encounter (Signed)
Community message sent to [facility] for DME supplies.   Will await response.   Talbot Grumbling, RN

## 2022-11-19 NOTE — Telephone Encounter (Signed)
Called [name] and provided with verbal orders.   Veronda Prude, RN

## 2022-11-19 NOTE — Telephone Encounter (Signed)
That is fine to wait until 11/26/22.  Abigail Starr, MD  Family Medicine Teaching Service

## 2022-11-19 NOTE — Telephone Encounter (Signed)
Receipt confirmed by [facility].   Beyounce Dickens C Christopherjame Carnell, RN  

## 2022-11-19 NOTE — Telephone Encounter (Signed)
Attempted to call son.  If calls back please  Ask how his mom is doing Confirm he got the Lasix from his pharmacy Let him know the urine culture will be collected next week (let me know if issue)  Terisa Starr, MD  [facility] Medicine Teaching Service

## 2022-11-21 ENCOUNTER — Encounter: Payer: Self-pay | Admitting: Family Medicine

## 2022-11-21 DIAGNOSIS — N184 Chronic kidney disease, stage 4 (severe): Secondary | ICD-10-CM | POA: Diagnosis not present

## 2022-11-21 DIAGNOSIS — I130 Hypertensive heart and chronic kidney disease with heart failure and stage 1 through stage 4 chronic kidney disease, or unspecified chronic kidney disease: Secondary | ICD-10-CM | POA: Diagnosis not present

## 2022-11-21 DIAGNOSIS — I5043 Acute on chronic combined systolic (congestive) and diastolic (congestive) heart failure: Secondary | ICD-10-CM | POA: Diagnosis not present

## 2022-11-21 DIAGNOSIS — E1122 Type 2 diabetes mellitus with diabetic chronic kidney disease: Secondary | ICD-10-CM | POA: Diagnosis not present

## 2022-11-21 DIAGNOSIS — D631 Anemia in chronic kidney disease: Secondary | ICD-10-CM | POA: Diagnosis not present

## 2022-11-21 DIAGNOSIS — Z466 Encounter for fitting and adjustment of urinary device: Secondary | ICD-10-CM | POA: Diagnosis not present

## 2022-11-23 ENCOUNTER — Encounter (INDEPENDENT_AMBULATORY_CARE_PROVIDER_SITE_OTHER): Payer: Self-pay | Admitting: Otolaryngology

## 2022-11-23 DIAGNOSIS — R339 Retention of urine, unspecified: Secondary | ICD-10-CM | POA: Diagnosis not present

## 2022-11-27 DIAGNOSIS — E1122 Type 2 diabetes mellitus with diabetic chronic kidney disease: Secondary | ICD-10-CM | POA: Diagnosis not present

## 2022-11-27 DIAGNOSIS — Z466 Encounter for fitting and adjustment of urinary device: Secondary | ICD-10-CM | POA: Diagnosis not present

## 2022-11-27 DIAGNOSIS — I5043 Acute on chronic combined systolic (congestive) and diastolic (congestive) heart failure: Secondary | ICD-10-CM | POA: Diagnosis not present

## 2022-11-27 DIAGNOSIS — I130 Hypertensive heart and chronic kidney disease with heart failure and stage 1 through stage 4 chronic kidney disease, or unspecified chronic kidney disease: Secondary | ICD-10-CM | POA: Diagnosis not present

## 2022-11-27 DIAGNOSIS — N184 Chronic kidney disease, stage 4 (severe): Secondary | ICD-10-CM | POA: Diagnosis not present

## 2022-11-27 DIAGNOSIS — D631 Anemia in chronic kidney disease: Secondary | ICD-10-CM | POA: Diagnosis not present

## 2022-11-28 DIAGNOSIS — I5043 Acute on chronic combined systolic (congestive) and diastolic (congestive) heart failure: Secondary | ICD-10-CM | POA: Diagnosis not present

## 2022-11-28 DIAGNOSIS — Z466 Encounter for fitting and adjustment of urinary device: Secondary | ICD-10-CM | POA: Diagnosis not present

## 2022-11-28 DIAGNOSIS — D631 Anemia in chronic kidney disease: Secondary | ICD-10-CM | POA: Diagnosis not present

## 2022-11-28 DIAGNOSIS — I130 Hypertensive heart and chronic kidney disease with heart failure and stage 1 through stage 4 chronic kidney disease, or unspecified chronic kidney disease: Secondary | ICD-10-CM | POA: Diagnosis not present

## 2022-11-28 DIAGNOSIS — N184 Chronic kidney disease, stage 4 (severe): Secondary | ICD-10-CM | POA: Diagnosis not present

## 2022-11-28 DIAGNOSIS — E1122 Type 2 diabetes mellitus with diabetic chronic kidney disease: Secondary | ICD-10-CM | POA: Diagnosis not present

## 2022-11-29 ENCOUNTER — Telehealth: Payer: Self-pay | Admitting: Family Medicine

## 2022-11-29 NOTE — Telephone Encounter (Signed)
Please call [name] and give verbal orders for placing catheter 16 F  Terisa Starr, MD  [facility] Medicine Teaching Service

## 2022-11-29 NOTE — Telephone Encounter (Signed)
Please call [facility] and ask if they have results of urine culture.  Terisa Starr, MD  Family Medicine Teaching Service

## 2022-11-29 NOTE — Telephone Encounter (Signed)
Called and provided verbal orders for catheter change with 16 F catheter.   I was informed that labs are still pending.   Also provided verbal orders for continuation of home health nursing.  One time per week for one week, once every 2 weeks for 8 weeks and 3 PRN visits.   Verbal orders given per protocol.   Veronda Prude, RN

## 2022-11-29 NOTE — Telephone Encounter (Signed)
Patient would like for Dr. Manson Passey to call her as soon as possible. She is having issues with her catheter. She states it is leaking and needs to discuss with Dr. Manson Passey about placing new order for home health to come out today to correct it for her.   Please call patient to discuss.

## 2022-11-29 NOTE — Telephone Encounter (Signed)
Called son. Discussed Foley change and urine culture.   Terisa Starr, MD  Family Medicine Teaching Service

## 2022-11-30 ENCOUNTER — Telehealth: Payer: Self-pay | Admitting: Family Medicine

## 2022-11-30 DIAGNOSIS — N184 Chronic kidney disease, stage 4 (severe): Secondary | ICD-10-CM | POA: Diagnosis not present

## 2022-11-30 DIAGNOSIS — I5043 Acute on chronic combined systolic (congestive) and diastolic (congestive) heart failure: Secondary | ICD-10-CM | POA: Diagnosis not present

## 2022-11-30 DIAGNOSIS — E1122 Type 2 diabetes mellitus with diabetic chronic kidney disease: Secondary | ICD-10-CM | POA: Diagnosis not present

## 2022-11-30 DIAGNOSIS — Z466 Encounter for fitting and adjustment of urinary device: Secondary | ICD-10-CM | POA: Diagnosis not present

## 2022-11-30 DIAGNOSIS — D631 Anemia in chronic kidney disease: Secondary | ICD-10-CM | POA: Diagnosis not present

## 2022-11-30 DIAGNOSIS — I130 Hypertensive heart and chronic kidney disease with heart failure and stage 1 through stage 4 chronic kidney disease, or unspecified chronic kidney disease: Secondary | ICD-10-CM | POA: Diagnosis not present

## 2022-11-30 NOTE — Telephone Encounter (Signed)
RN Team- can you please call [facility] and ask to refax labs? Metabolic panel was cut off on the faxed part    Admin Team- please schedule for home visit with me on 11/26 AM (9 AM is good!)   Thanks Terisa Starr, MD  [facility] Medicine Teaching Service

## 2022-12-01 DIAGNOSIS — I5043 Acute on chronic combined systolic (congestive) and diastolic (congestive) heart failure: Secondary | ICD-10-CM | POA: Diagnosis not present

## 2022-12-01 DIAGNOSIS — I130 Hypertensive heart and chronic kidney disease with heart failure and stage 1 through stage 4 chronic kidney disease, or unspecified chronic kidney disease: Secondary | ICD-10-CM | POA: Diagnosis not present

## 2022-12-01 DIAGNOSIS — Z466 Encounter for fitting and adjustment of urinary device: Secondary | ICD-10-CM | POA: Diagnosis not present

## 2022-12-01 DIAGNOSIS — N184 Chronic kidney disease, stage 4 (severe): Secondary | ICD-10-CM | POA: Diagnosis not present

## 2022-12-01 DIAGNOSIS — E1122 Type 2 diabetes mellitus with diabetic chronic kidney disease: Secondary | ICD-10-CM | POA: Diagnosis not present

## 2022-12-01 DIAGNOSIS — D631 Anemia in chronic kidney disease: Secondary | ICD-10-CM | POA: Diagnosis not present

## 2022-12-03 NOTE — Telephone Encounter (Signed)
Called [name]. They will refax labs.   Veronda Prude, RN

## 2022-12-04 ENCOUNTER — Telehealth: Payer: Self-pay | Admitting: Family Medicine

## 2022-12-04 DIAGNOSIS — J811 Chronic pulmonary edema: Secondary | ICD-10-CM | POA: Diagnosis not present

## 2022-12-04 NOTE — Telephone Encounter (Signed)
Called to discuss. Scheduled home visit.  Terisa Starr, MD  Family Medicine Teaching Service

## 2022-12-10 ENCOUNTER — Encounter: Payer: Self-pay | Admitting: Family Medicine

## 2022-12-10 DIAGNOSIS — J441 Chronic obstructive pulmonary disease with (acute) exacerbation: Secondary | ICD-10-CM

## 2022-12-11 ENCOUNTER — Telehealth: Payer: Self-pay | Admitting: Family Medicine

## 2022-12-11 ENCOUNTER — Encounter: Payer: Self-pay | Admitting: Family Medicine

## 2022-12-11 ENCOUNTER — Ambulatory Visit: Payer: 59 | Admitting: Family Medicine

## 2022-12-11 VITALS — BP 138/82 | HR 71 | Resp 16

## 2022-12-11 DIAGNOSIS — J411 Mucopurulent chronic bronchitis: Secondary | ICD-10-CM | POA: Diagnosis not present

## 2022-12-11 DIAGNOSIS — I480 Paroxysmal atrial fibrillation: Secondary | ICD-10-CM | POA: Diagnosis not present

## 2022-12-11 DIAGNOSIS — K5904 Chronic idiopathic constipation: Secondary | ICD-10-CM

## 2022-12-11 DIAGNOSIS — I10 Essential (primary) hypertension: Secondary | ICD-10-CM

## 2022-12-11 DIAGNOSIS — N1832 Chronic kidney disease, stage 3b: Secondary | ICD-10-CM | POA: Diagnosis not present

## 2022-12-11 DIAGNOSIS — R339 Retention of urine, unspecified: Secondary | ICD-10-CM | POA: Diagnosis not present

## 2022-12-11 DIAGNOSIS — F209 Schizophrenia, unspecified: Secondary | ICD-10-CM

## 2022-12-11 DIAGNOSIS — Z7189 Other specified counseling: Secondary | ICD-10-CM | POA: Diagnosis not present

## 2022-12-11 MED ORDER — AMLODIPINE BESYLATE 10 MG PO TABS: 10.0000 mg | ORAL_TABLET | Freq: Every day | ORAL | 3 refills | Status: DC

## 2022-12-11 MED ORDER — APIXABAN 2.5 MG PO TABS: 2.5000 mg | ORAL_TABLET | Freq: Two times a day (BID) | ORAL | 3 refills | Status: DC

## 2022-12-11 MED ORDER — DOXYCYCLINE HYCLATE 100 MG PO TABS: 100.0000 mg | ORAL_TABLET | Freq: Two times a day (BID) | ORAL | 0 refills | Status: DC

## 2022-12-11 MED ORDER — POLYETHYLENE GLYCOL 3350 17 GM/SCOOP PO POWD: 17.0000 g | Freq: Three times a day (TID) | ORAL | 1 refills | Status: DC

## 2022-12-11 NOTE — Assessment & Plan Note (Addendum)
Will send in reduced dose of Eliquis.

## 2022-12-11 NOTE — Assessment & Plan Note (Addendum)
At goal refilled amlodipine

## 2022-12-11 NOTE — Progress Notes (Signed)
SUBJECTIVE:  Home visit  CHIEF COMPLAINT: cough  HPI:   Abigail Wallace is a 77 y.o.  with history notable for mood disorder followed by psychiatry with ongoing tardive dyskinesias, COPD, former tobacco use, type 2 diabetes which is well-controlled and CKD 3B presenting for cough and home visit  Her son is present for the entirety of the visit.  Last weekend she spent time with her other family members as her son went away.  She has been doing incredibly well.  Her mood is good she is been very clear and doing very well with her medications.  She last ambulated outside about a week ago.  Transferring continues to be difficult for her.  She is not able to make it to see her psychiatrist anymore.  She continues to have a Foley catheter in place for urinary retention she follows with urology for this.  No signs or symptoms of infection at this point.  Her skin lesions have all healed.  She does report some intermittent constipation.  She has a hard bowel movement about once a week.  She does not find the senna benefits her.  Her son uses a suppository once a week.  They are doing MiraLAX twice a day.  She does not mind the MiraLAX and tolerates it well.  The patient reports compliance with her medications.  Her blood sugars in the morning run from the low 100s to as high as 129.  Her blood pressures are lower in the morning when her son is present in the 120s to 130s over 60s to 70s.  They rise in the afternoon at times depending on her home health aide.  The patient does report a few week history of a cough.  This started a few weeks ago then subsided and has returned.  This returned yesterday.  No dyspnea or chest pain.  No fevers.  She does have some sputum production.  She is not smoking.  One of her home   health aides does use cannabis products which aggravates her cough.Marland Kitchen   PERTINENT  PMH / PSH/Family/Social History : Has known pulmonary nodules that we discussed for follow-up today.  They  will defer at this time both patient and her son are understanding of the risk.  OBJECTIVE:   BP 138/82   Pulse 71   Resp 16   SpO2 97%   Today's weight:  Review of prior weights: There were no vitals filed for this visit. Irregular rate and rhythm Lungs with long expiratory phase but clear after coughing Minimal edema Foot exam in simple foot exam tab--no ulcers but thickening of nails   ASSESSMENT/PLAN:   Assessment & Plan Essential hypertension At goal refilled amlodipine  Mucopurulent chronic bronchitis (HCC) Ongoing cough--Ddx includes post nasal drip, COPD exacerbation, also considered pneumonia but she has had no fevers and otherwise feels well.  Also considered heart failure but her urine output has been excellent and her pulse ox has remained well above her usual in the high 90s (volume status challenging).  Discussed her son will call me if she worsens will consider a short burst of doxycycline.  She does not tolerate oral steroids. CKD stage IIIb Will repeat labs in December.  She is avoiding nephrotoxic medications.  Will forward results to Dr. Allena Katz. Advanced care planning/counseling discussion Discussed goals of care today.  Patient at this point is unable to leave her house.  We discussed limitations in follow-up for chronic diseases as well as her  ongoing cancer screening.  At this time her and her son both wish to defer ongoing screening and invasive testing.  Will continue to manage her symptoms at home. She has been tested, support and her son Ferne Reus takes excellent care of her Paroxysmal atrial fibrillation [facility]) s/p cardioversion  Will send in reduced dose of Eliquis. Schizophrenia, unspecified type (HCC) Doing well.  Will refill medications if her psychiatrist is unable to do so will obtain monitoring labs as appropriate.  As she has been stable I think it is appropriate to continue these at this time.  She has had minimal side effects and her schizophrenia is very  well-controlled at this point.  Haldol already on aller list  Urinary retention She is at high risk for infection.  Given this she does require frequent bank changes and her safety latches can cause skin breakdown.  Will ask home health to send these in. Chronic idiopathic constipation Increase Miralax to TID Discussed suppository use Removed Senna from lsit    Will follow up in January. Will ask [name] to obtain labs first week in December.    Terisa Starr, MD  Family Medicine Teaching Service  [facility]

## 2022-12-11 NOTE — Assessment & Plan Note (Addendum)
Will repeat labs in December.  She is avoiding nephrotoxic medications.  Will forward results to Dr. Allena Katz.

## 2022-12-11 NOTE — Assessment & Plan Note (Addendum)
She is at high risk for infection.  Given this she does require frequent bank changes and her safety latches can cause skin breakdown.  Will ask home health to send these in.

## 2022-12-11 NOTE — Assessment & Plan Note (Addendum)
Discussed goals of care today.  Patient at this point is unable to leave her house.  We discussed limitations in follow-up for chronic diseases as well as her ongoing cancer screening.  At this time her and her son both wish to defer ongoing screening and invasive testing.  Will continue to manage her symptoms at home. She has been tested, support and her son Ferne Reus takes excellent care of her

## 2022-12-11 NOTE — Telephone Encounter (Signed)
Patient has need for DME. I have ordered  Catheter supplies X 2 . I am routing note for [facility] RN Pool.   Nursing-please also call an aggressive and asked them to draw blood work the first or second week in December.  This should include the following: CMP, CBC with differential.   Westley Chandler, MD

## 2022-12-11 NOTE — Telephone Encounter (Addendum)
Called [name] and spoke with [name].  VO given for CBC with differential and CMP.  [name] reports they are scheduled to go out to her home on 12/8.  Community message sent for [employer].

## 2022-12-11 NOTE — Assessment & Plan Note (Addendum)
Ongoing cough--Ddx includes post nasal drip, COPD exacerbation, also considered pneumonia but she has had no fevers and otherwise feels well.  Also considered heart failure but her urine output has been excellent and her pulse ox has remained well above her usual in the high 90s (volume status challenging).  Discussed her son will call me if she worsens will consider a short burst of doxycycline.  She does not tolerate oral steroids.

## 2022-12-11 NOTE — Assessment & Plan Note (Addendum)
Doing well.  Will refill medications if her psychiatrist is unable to do so will obtain monitoring labs as appropriate.  As she has been stable I think it is appropriate to continue these at this time.  She has had minimal side effects and her schizophrenia is very well-controlled at this point.  Haldol already on aller list

## 2022-12-12 ENCOUNTER — Other Ambulatory Visit: Payer: Self-pay | Admitting: *Deleted

## 2022-12-12 MED ORDER — TRELEGY ELLIPTA 100-62.5-25 MCG/ACT IN AEPB: 1.0000 | INHALATION_SPRAY | Freq: Every day | RESPIRATORY_TRACT | 3 refills | Status: DC

## 2022-12-17 ENCOUNTER — Encounter: Payer: Self-pay | Admitting: Family Medicine

## 2022-12-17 ENCOUNTER — Encounter: Payer: Self-pay | Admitting: Cardiovascular Disease

## 2022-12-17 NOTE — Telephone Encounter (Signed)
[employer] sent to [employer].   Will update [name] when able.

## 2022-12-21 NOTE — Telephone Encounter (Signed)
See update from [employer].   Berniece Salines; Kathe Becton; Bess Kinds, MD; Macon Large Just asked for an update. Thank you.  Will continue to wait for an update on supplies.

## 2022-12-24 ENCOUNTER — Inpatient Hospital Stay: Admission: RE | Admit: 2022-12-24 | Payer: 59 | Source: Ambulatory Visit

## 2022-12-26 DIAGNOSIS — R339 Retention of urine, unspecified: Secondary | ICD-10-CM | POA: Diagnosis not present

## 2022-12-27 DIAGNOSIS — E1122 Type 2 diabetes mellitus with diabetic chronic kidney disease: Secondary | ICD-10-CM | POA: Diagnosis not present

## 2022-12-27 DIAGNOSIS — I130 Hypertensive heart and chronic kidney disease with heart failure and stage 1 through stage 4 chronic kidney disease, or unspecified chronic kidney disease: Secondary | ICD-10-CM | POA: Diagnosis not present

## 2022-12-27 DIAGNOSIS — N184 Chronic kidney disease, stage 4 (severe): Secondary | ICD-10-CM | POA: Diagnosis not present

## 2022-12-27 DIAGNOSIS — Z466 Encounter for fitting and adjustment of urinary device: Secondary | ICD-10-CM | POA: Diagnosis not present

## 2022-12-27 DIAGNOSIS — D631 Anemia in chronic kidney disease: Secondary | ICD-10-CM | POA: Diagnosis not present

## 2022-12-27 DIAGNOSIS — I5043 Acute on chronic combined systolic (congestive) and diastolic (congestive) heart failure: Secondary | ICD-10-CM | POA: Diagnosis not present

## 2022-12-28 ENCOUNTER — Telehealth: Payer: Self-pay

## 2022-12-28 ENCOUNTER — Encounter: Payer: Self-pay | Admitting: Family Medicine

## 2022-12-28 DIAGNOSIS — J411 Mucopurulent chronic bronchitis: Secondary | ICD-10-CM

## 2022-12-28 NOTE — Telephone Encounter (Signed)
Reviewed labs, awaiting CMP Sent message in Mychart

## 2022-12-28 NOTE — Telephone Encounter (Signed)
Patient's son LVM on nurse line regarding concerns with [name] obtaining labs. He reports that he was told that [name] did not have any lab orders.   Called [name] to look into issue further. Spoke with [name] who advised that they have the orders and patient had visit yesterday. Reports that note has not yet been completed, however, she will reach out to nursing to ensure lab work was completed. If there was an issue with labs, they will draw them at the next visit.   Son is also asking about oxygen re-cert through [employer].   Attempted to call son back to discuss everything further. He did not answer, sent mychart message.   Veronda Prude, RN

## 2022-12-28 NOTE — Telephone Encounter (Signed)
Son returned call to nurse line regarding oxygen re certification. Advised that I would send message to [employer] to determine the requirements.   Community message sent.   He is also requesting orders for nebulizer supplies (tubing, chamber, etc)  Will send message to PCP regarding request.   Veronda Prude, RN

## 2022-12-28 NOTE — Telephone Encounter (Signed)
Received returned call from [name].   She reports that labs were obtained and results were faxed to our office this AM.   Results placed in PCP box for review.   Veronda Prude, RN

## 2022-12-29 LAB — LAB REPORT - SCANNED: EGFR: 26

## 2022-12-29 NOTE — Addendum Note (Signed)
Addended by: Manson Passey, Tanishi Nault on: 12/29/2022 05:38 AM   Modules accepted: Orders

## 2022-12-29 NOTE — Telephone Encounter (Signed)
Patient has need for DME. I have ordered  tubing and chamber for nebulizer . I am routing note for [facility] RN Pool.   Westley Chandler, MD

## 2022-12-31 ENCOUNTER — Ambulatory Visit (INDEPENDENT_AMBULATORY_CARE_PROVIDER_SITE_OTHER): Payer: 59

## 2022-12-31 DIAGNOSIS — I442 Atrioventricular block, complete: Secondary | ICD-10-CM

## 2022-12-31 NOTE — Telephone Encounter (Signed)
Receipt confirmed by [facility].   Beyounce Dickens C Christopherjame Carnell, RN  

## 2022-12-31 NOTE — Telephone Encounter (Signed)
Community message sent to [employer]. Will await response.   Jozlyn Schatz C January Bergthold, RN  

## 2023-01-01 ENCOUNTER — Telehealth: Payer: Self-pay | Admitting: Family Medicine

## 2023-01-01 DIAGNOSIS — Z993 Dependence on wheelchair: Secondary | ICD-10-CM

## 2023-01-01 DIAGNOSIS — J411 Mucopurulent chronic bronchitis: Secondary | ICD-10-CM

## 2023-01-01 DIAGNOSIS — B91 Sequelae of poliomyelitis: Secondary | ICD-10-CM

## 2023-01-01 DIAGNOSIS — I480 Paroxysmal atrial fibrillation: Secondary | ICD-10-CM

## 2023-01-01 NOTE — Telephone Encounter (Addendum)
Called son. Discussed labs in 1 month. BP is 130/70s with son in AM.   Patient has need for DME. I have ordered  [facility] lift . I am routing note for [facility] RN Pool.   Westley Chandler, MD

## 2023-01-02 DIAGNOSIS — J411 Mucopurulent chronic bronchitis: Secondary | ICD-10-CM | POA: Diagnosis not present

## 2023-01-03 ENCOUNTER — Encounter: Payer: Self-pay | Admitting: Cardiovascular Disease

## 2023-01-03 DIAGNOSIS — J811 Chronic pulmonary edema: Secondary | ICD-10-CM | POA: Diagnosis not present

## 2023-01-04 NOTE — Telephone Encounter (Signed)
Community message sent to [employer]. Will await response.   Jozlyn Schatz C January Bergthold, RN  

## 2023-01-05 DIAGNOSIS — J449 Chronic obstructive pulmonary disease, unspecified: Secondary | ICD-10-CM | POA: Diagnosis not present

## 2023-01-05 DIAGNOSIS — M179 Osteoarthritis of knee, unspecified: Secondary | ICD-10-CM | POA: Diagnosis not present

## 2023-01-05 DIAGNOSIS — I951 Orthostatic hypotension: Secondary | ICD-10-CM | POA: Diagnosis not present

## 2023-01-07 DIAGNOSIS — J449 Chronic obstructive pulmonary disease, unspecified: Secondary | ICD-10-CM | POA: Diagnosis not present

## 2023-01-07 LAB — CUP PACEART REMOTE DEVICE CHECK
Battery Impedance: 1633 Ohm
Battery Remaining Longevity: 46 mo
Battery Voltage: 2.76 V
Brady Statistic RV Percent Paced: 100 %
Date Time Interrogation Session: 20241223065323
Implantable Lead Connection Status: 753985
Implantable Lead Connection Status: 753985
Implantable Lead Implant Date: 20070414
Implantable Lead Implant Date: 20070914
Implantable Lead Location: 753859
Implantable Lead Location: 753860
Implantable Lead Model: 4092
Implantable Lead Model: 5594
Implantable Pulse Generator Implant Date: 20160816
Lead Channel Impedance Value: 611 Ohm
Lead Channel Impedance Value: 67 Ohm
Lead Channel Pacing Threshold Amplitude: 1.25 V
Lead Channel Pacing Threshold Pulse Width: 0.4 ms
Lead Channel Setting Pacing Amplitude: 2.5 V
Lead Channel Setting Pacing Pulse Width: 0.4 ms
Lead Channel Setting Sensing Sensitivity: 4 mV
Zone Setting Status: 755011
Zone Setting Status: 755011

## 2023-01-10 IMAGING — CT CT CHEST NODULE FOLLOW UP LOW DOSE W/O CM
2 of 4 series · 15 of 36 positions shown, 18 images · non-contrast
Comparison: CT chest 03/03/2020

CLINICAL DATA: Lung cancer screening.  Quit smoking 2 months ago.



[Series 3: lungs (person_name) · axial · 0.85mm/px · z∈[-267,+13]mm · 12 of 308 slices shown, 15 images]
[im 14/308  mediastinal]
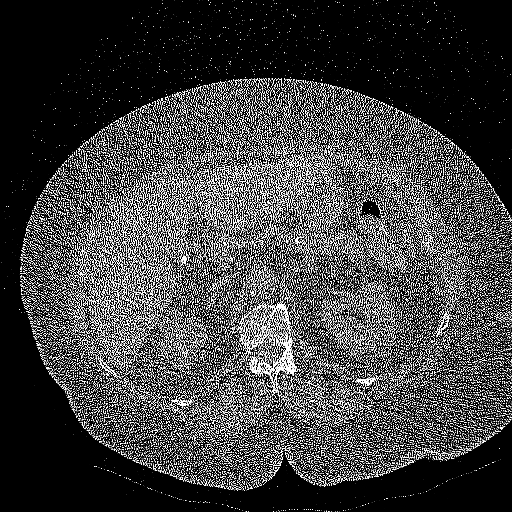
[im 14/308  lung]
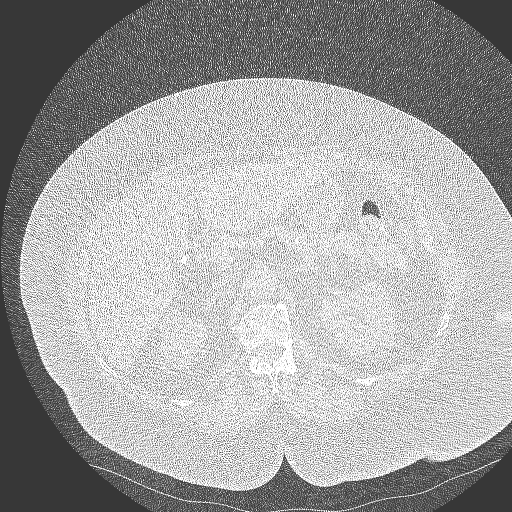
[im 42/308  lung]
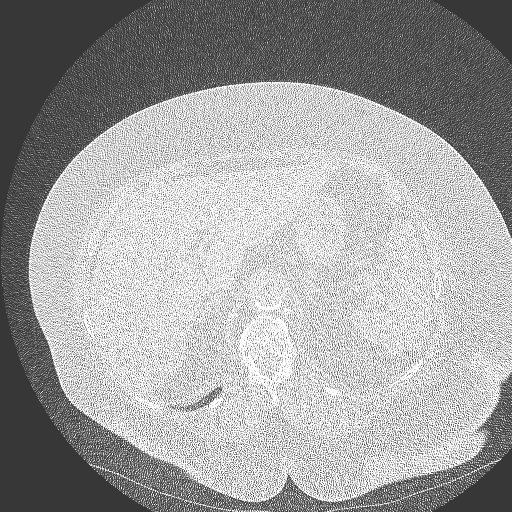
[im 70/308  lung]
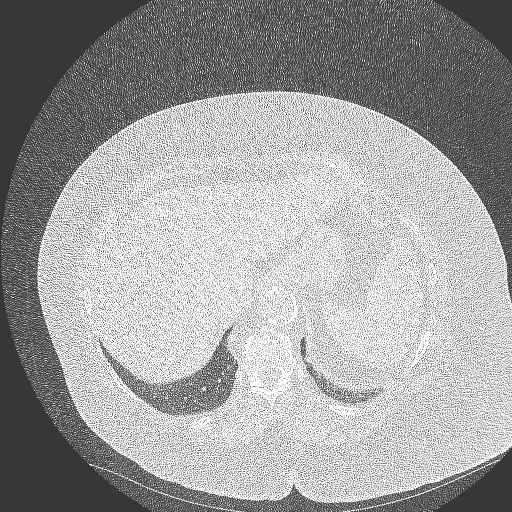
[im 98/308  lung]
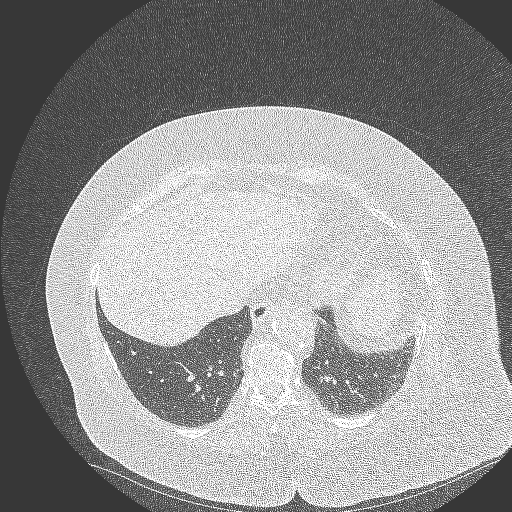
[im 112/308  mediastinal]
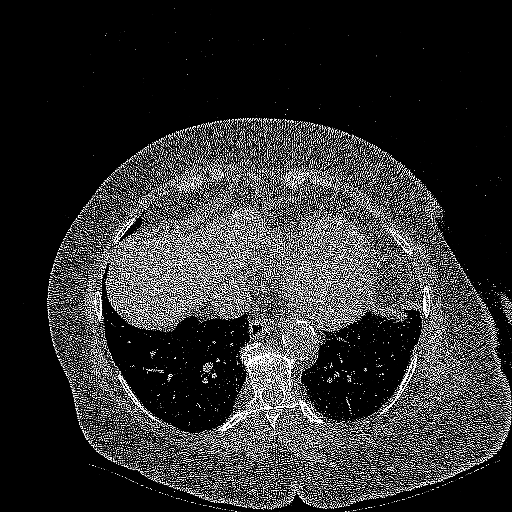
[im 112/308  lung]
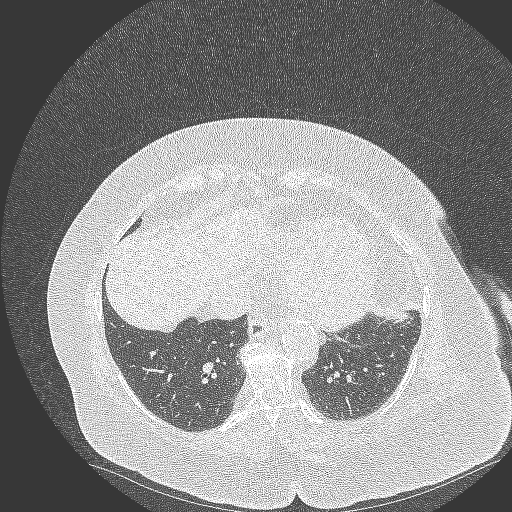
[im 140/308  lung]
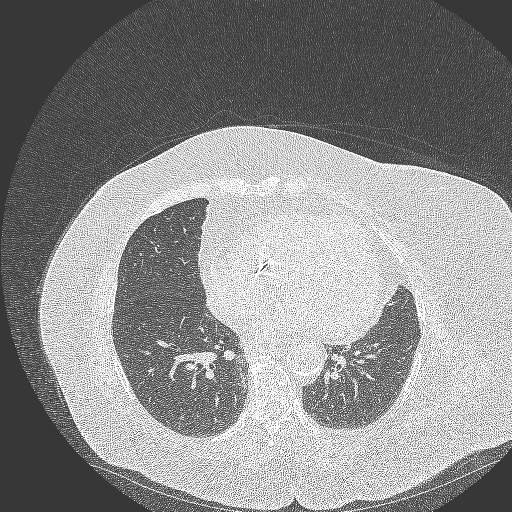
[im 168/308  lung]
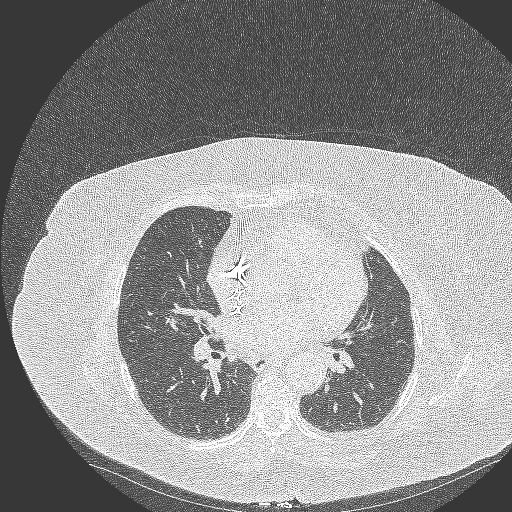
[im 196/308  lung]
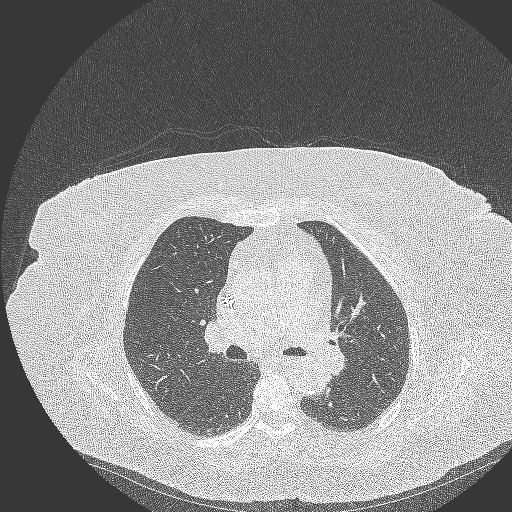
[im 210/308  mediastinal]
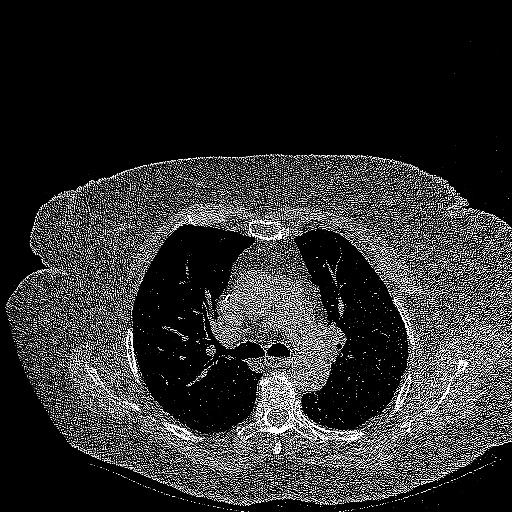
[im 210/308  lung]
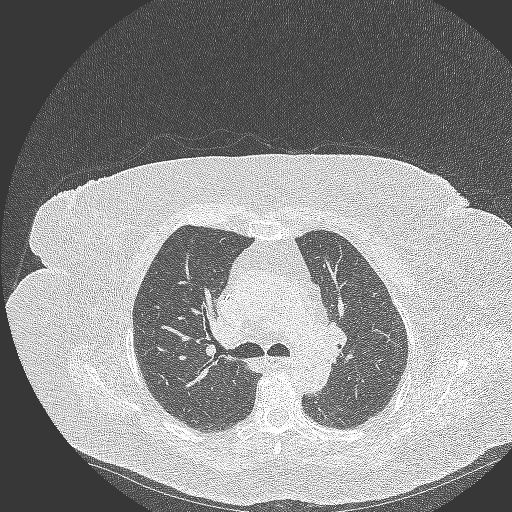
[im 238/308  lung]
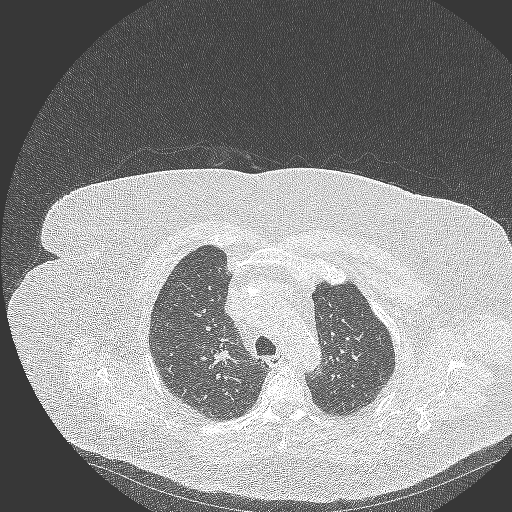
[im 266/308  lung]
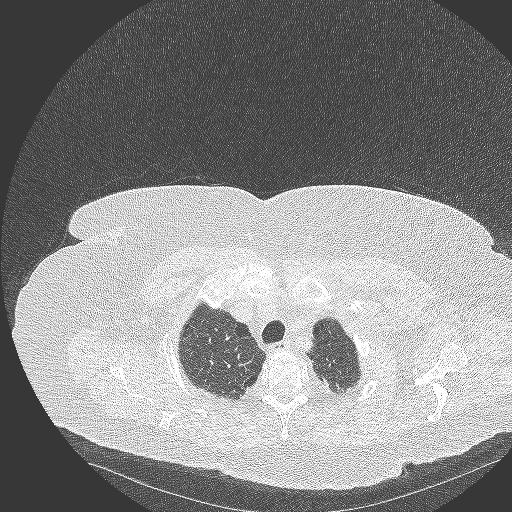
[im 294/308  lung]
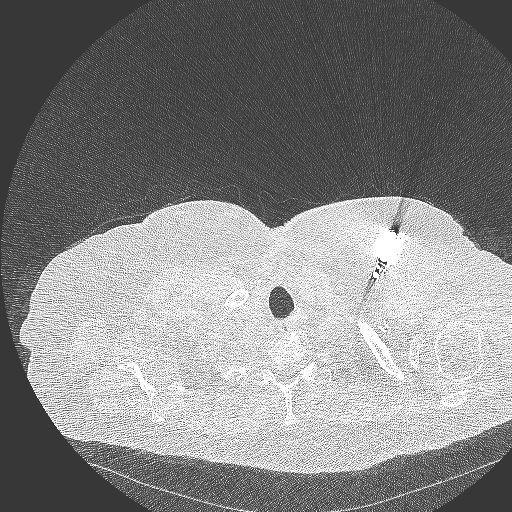

[Series 5: coronal (person_name) · coronal · 0.68mm/px · 3 of 370 slices shown]
[im 74/370  lung]
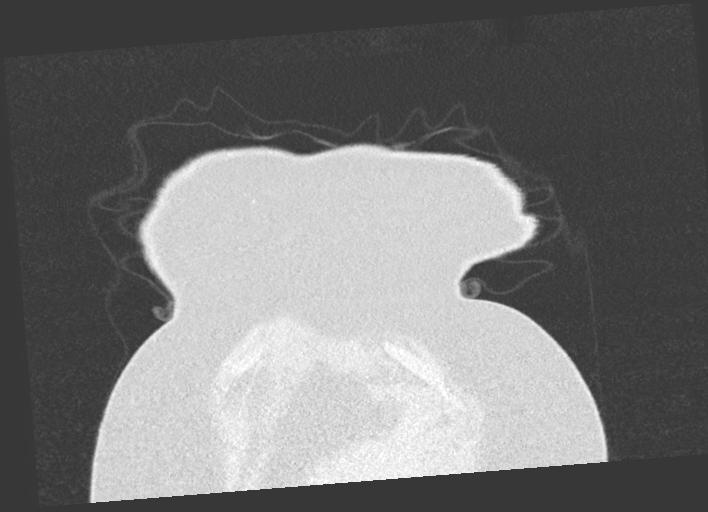
[im 148/370  lung]
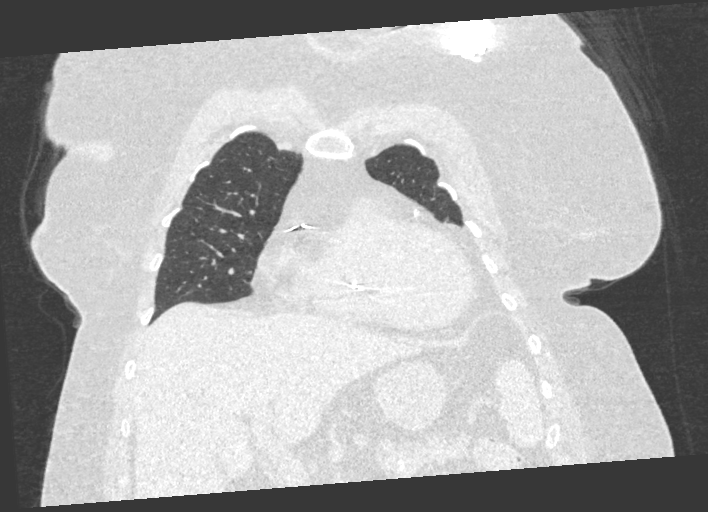
[im 222/370  lung]
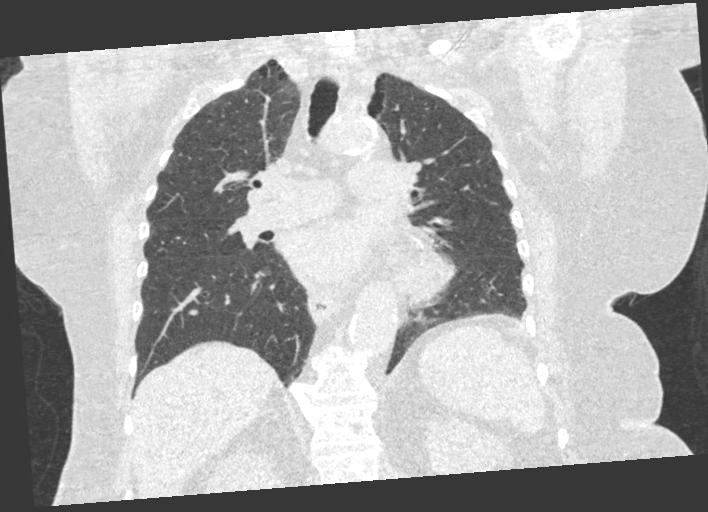

[15 of 36 positions shown; findings below may reference images not displayed]

FINDINGS: Cardiovascular: Mild cardiac enlargement. Left chest wall pacer is
identified with leads in the right atrium and right ventricle.
Cardiac enlargement and coronary artery calcifications. No
pericardial effusion.

Mediastinum/Nodes: No enlarged mediastinal or axillary lymph nodes.
Thyroid gland, trachea, and esophagus demonstrate no significant
findings.

Lungs/Pleura: Moderate paraseptal and mild centrilobular emphysema.
No pleural effusion or airspace consolidation. Single nodule within
the superior segment of left lower lobe has a mean derived diameter
of 11.2 mm. This is not significantly changed when compared with
08/27/2019. No new lung nodules.

Upper Abdomen: No acute findings.  Aortic atherosclerosis.

Musculoskeletal: No chest wall mass or suspicious bone lesions
identified.
IMPRESSION: 1. Lung-RADS 2, benign appearance or behavior. Continue annual
screening with low-dose chest CT without contrast in 12 months.
2. Coronary artery calcifications.
3. Aortic Atherosclerosis (QVDGH-9WW.W) and Emphysema ([id number].Y).

## 2023-01-11 ENCOUNTER — Telehealth: Payer: Self-pay

## 2023-01-11 NOTE — Telephone Encounter (Signed)
Called son. Had some small blood on outside of stool. Will send photos. Discussed ED precautions. Discussed colonoscopy--son adamantly declines, aware of risk of colorectal cancer.  Discussed that white items likely casing to capsule of oxybutynin.   Page/Hannah- can you please call [name] and ask to get CBC in next week?  Terisa Starr, MD  Family Medicine Teaching Service

## 2023-01-11 NOTE — Telephone Encounter (Signed)
Patient's son calls nurse line regarding concerns with patient's bowel movements.   He reports that he has noticed white film in her bowel movements for the last few weeks. Also reports blood in stool a few weeks ago that has since resolved.   He denies current abdominal pain, fever, nausea, vomiting or diarrhea.  She has a bowel movement daily, sometime every other day. No new changes in diet.   He is not sure if patient has ever had a colonoscopy.   Son reports that it is quite difficult to get patient into the office for an appointment and would like provider input regarding next steps.    Advised that I would send message to Dr. Manson Passey to discuss next steps. ED precautions discussed.   Son also reports that strap for [facility] lift is too small. [employer] is needing [place] to send another order for an [facility] person.   Veronda Prude, RN

## 2023-01-11 NOTE — Telephone Encounter (Signed)
Called son See separate note

## 2023-01-14 ENCOUNTER — Telehealth: Payer: Self-pay | Admitting: Family Medicine

## 2023-01-14 DIAGNOSIS — J411 Mucopurulent chronic bronchitis: Secondary | ICD-10-CM

## 2023-01-14 MED ORDER — DOXYCYCLINE HYCLATE 100 MG PO TABS: 100.0000 mg | ORAL_TABLET | Freq: Two times a day (BID) | ORAL | 0 refills | Status: AC

## 2023-01-14 MED ORDER — AMOXICILLIN-POT CLAVULANATE 500-125 MG PO TABS: 1.0000 | ORAL_TABLET | Freq: Two times a day (BID) | ORAL | 0 refills | Status: AC

## 2023-01-14 NOTE — Telephone Encounter (Signed)
Called [place], spoke with [place], provided verbal orders for CBC.   Veronda Prude, RN

## 2023-01-14 NOTE — Telephone Encounter (Signed)
Called and spoke with her son Abigail Wallace.  Low grade temperatures noted at home and irritable, blood pressures have been 150s. Urine darker than normal for past several days. Still doing at least 30 cc/hr of urine. 150-200 cc/day. Still coughing at home, notes temperate 99.1 F which is higher than her baseline. Small amount of thin clear phlegm. No green thick mucous. No vomiting. Eating and drinking well, oxygen in 94%. Still coughing, mostly at night. Not confused, just irritable. Has chronic nausea, no vomiting. Has abdominal pain every day, not changed. Has noted some dysuria but she states this often.   Son notes she will not come in and he will respect her wishes to not go by EMS to clinic or ED.  I discussed my concerns that we cannot evaluate her and with her respiratory symptoms as well as possibly urinary symptoms can be hard to know what we are treating. And while not having true fever at home her irritability can also suggest infection. Discussed that I recommend ED or clinic evaluation, and that he can and should take her ASAP. He politely refuses at this time.   As [name] is adamant that she won't come in to clinic and he wants to try to treat her at home, will cover for pneumonia with Augmentin/doxycycline and also for UTI with Augmentin (most recent urine cultures showing Enterococcus sensitive to Augmentin on 09/20/22). While we discussed evaluating her would be best and I recommend ED evaluation, will try for harm reduction with antibiotics to treat possible pneumonia or UTI at home. Discussed risks of staying home include worsening infection, sepsis, death, son is aware and notes if she has a high fever, stops eating or drinking, decreased oxygen levels, lethargy or confusion, to bring her to hospital ASAP.  Dahlia Client- can we fax [name] to check CBC/BMP and replace foley this week?   Total time: 20 minutes on phone  Burley Saver MD

## 2023-01-16 ENCOUNTER — Encounter: Payer: Self-pay | Admitting: Family Medicine

## 2023-01-17 ENCOUNTER — Telehealth: Payer: Self-pay | Admitting: Family Medicine

## 2023-01-17 ENCOUNTER — Emergency Department (HOSPITAL_COMMUNITY): Payer: 59

## 2023-01-17 ENCOUNTER — Other Ambulatory Visit: Payer: Self-pay

## 2023-01-17 ENCOUNTER — Emergency Department (HOSPITAL_COMMUNITY)
Admission: EM | Admit: 2023-01-17 | Discharge: 2023-01-18 | Disposition: A | Discharge status: Home / Self Care | Payer: 59 | Attending: Emergency Medicine | Admitting: Emergency Medicine

## 2023-01-17 ENCOUNTER — Encounter (HOSPITAL_COMMUNITY): Payer: Self-pay

## 2023-01-17 DIAGNOSIS — R1030 Lower abdominal pain, unspecified: Secondary | ICD-10-CM | POA: Diagnosis not present

## 2023-01-17 DIAGNOSIS — E871 Hypo-osmolality and hyponatremia: Secondary | ICD-10-CM | POA: Insufficient documentation

## 2023-01-17 DIAGNOSIS — Z1152 Encounter for screening for COVID-19: Secondary | ICD-10-CM | POA: Insufficient documentation

## 2023-01-17 DIAGNOSIS — R21 Rash and other nonspecific skin eruption: Secondary | ICD-10-CM | POA: Diagnosis not present

## 2023-01-17 DIAGNOSIS — R109 Unspecified abdominal pain: Secondary | ICD-10-CM | POA: Insufficient documentation

## 2023-01-17 DIAGNOSIS — Z743 Need for continuous supervision: Secondary | ICD-10-CM | POA: Diagnosis not present

## 2023-01-17 DIAGNOSIS — K439 Ventral hernia without obstruction or gangrene: Secondary | ICD-10-CM | POA: Diagnosis not present

## 2023-01-17 DIAGNOSIS — Z91040 Latex allergy status: Secondary | ICD-10-CM | POA: Insufficient documentation

## 2023-01-17 DIAGNOSIS — B029 Zoster without complications: Secondary | ICD-10-CM | POA: Insufficient documentation

## 2023-01-17 DIAGNOSIS — I714 Abdominal aortic aneurysm, without rupture, unspecified: Secondary | ICD-10-CM | POA: Diagnosis not present

## 2023-01-17 DIAGNOSIS — Z7901 Long term (current) use of anticoagulants: Secondary | ICD-10-CM | POA: Diagnosis not present

## 2023-01-17 DIAGNOSIS — F0390 Unspecified dementia without behavioral disturbance: Secondary | ICD-10-CM | POA: Insufficient documentation

## 2023-01-17 DIAGNOSIS — Z9049 Acquired absence of other specified parts of digestive tract: Secondary | ICD-10-CM | POA: Diagnosis not present

## 2023-01-17 LAB — RESP PANEL BY RT-PCR (RSV, FLU A&B, COVID)  RVPGX2
Influenza A by PCR: NEGATIVE
Influenza B by PCR: NEGATIVE
Resp Syncytial Virus by PCR: NEGATIVE
SARS Coronavirus 2 by RT PCR: NEGATIVE

## 2023-01-17 LAB — CBC WITH DIFFERENTIAL/PLATELET
Abs Immature Granulocytes: 0.34 10*3/uL — ABNORMAL HIGH (ref 0.00–0.07)
Basophils Absolute: 0.1 10*3/uL (ref 0.0–0.1)
Basophils Relative: 1 %
Eosinophils Absolute: 0.3 10*3/uL (ref 0.0–0.5)
Eosinophils Relative: 4 %
HCT: 33.1 % — ABNORMAL LOW (ref 36.0–46.0)
Hemoglobin: 11.2 g/dL — ABNORMAL LOW (ref 12.0–15.0)
Immature Granulocytes: 4 %
Lymphocytes Relative: 16 %
Lymphs Abs: 1.3 10*3/uL (ref 0.7–4.0)
MCH: 27.7 pg (ref 26.0–34.0)
MCHC: 33.8 g/dL (ref 30.0–36.0)
MCV: 81.7 fL (ref 80.0–100.0)
Monocytes Absolute: 0.8 10*3/uL (ref 0.1–1.0)
Monocytes Relative: 9 %
Neutro Abs: 5.4 10*3/uL (ref 1.7–7.7)
Neutrophils Relative %: 66 %
Platelets: 138 10*3/uL — ABNORMAL LOW (ref 150–400)
RBC: 4.05 MIL/uL (ref 3.87–5.11)
RDW: 15.9 % — ABNORMAL HIGH (ref 11.5–15.5)
WBC: 8.1 10*3/uL (ref 4.0–10.5)
nRBC: 0 % (ref 0.0–0.2)

## 2023-01-17 LAB — BASIC METABOLIC PANEL
Anion gap: 11 (ref 5–15)
BUN: 31 mg/dL — ABNORMAL HIGH (ref 8–23)
CO2: 19 mmol/L — ABNORMAL LOW (ref 22–32)
Calcium: 10.1 mg/dL (ref 8.9–10.3)
Chloride: 95 mmol/L — ABNORMAL LOW (ref 98–111)
Creatinine, Ser: 1.92 mg/dL — ABNORMAL HIGH (ref 0.44–1.00)
GFR, Estimated: 26 mL/min — ABNORMAL LOW (ref >60)
Glucose, Bld: 118 mg/dL — ABNORMAL HIGH (ref 70–99)
Potassium: 4.1 mmol/L (ref 3.5–5.1)
Sodium: 125 mmol/L — ABNORMAL LOW (ref 135–145)

## 2023-01-17 LAB — URINALYSIS, ROUTINE W REFLEX MICROSCOPIC
Bilirubin Urine: NEGATIVE
Glucose, UA: 50 mg/dL — AB
Ketones, ur: NEGATIVE mg/dL
Leukocytes,Ua: NEGATIVE
Nitrite: NEGATIVE
Protein, ur: 100 mg/dL — AB
Specific Gravity, Urine: 1.006 (ref 1.005–1.030)
pH: 6 (ref 5.0–8.0)

## 2023-01-17 MED ORDER — KETOROLAC TROMETHAMINE 30 MG/ML IJ SOLN
30.0000 mg | Freq: Once | INTRAMUSCULAR | Status: AC
  Administered 2023-01-17: 30 mg via INTRAVENOUS
  Filled 2023-01-17: qty 1

## 2023-01-17 MED ORDER — GABAPENTIN 100 MG PO CAPS: 100.0000 mg | ORAL_CAPSULE | Freq: Three times a day (TID) | ORAL | 0 refills | Status: DC

## 2023-01-17 MED ORDER — GABAPENTIN 300 MG PO CAPS
300.0000 mg | ORAL_CAPSULE | Freq: Once | ORAL | Status: AC
  Administered 2023-01-17: 300 mg via ORAL
  Filled 2023-01-17: qty 1

## 2023-01-17 MED ORDER — VALACYCLOVIR HCL 500 MG PO TABS
1000.0000 mg | ORAL_TABLET | Freq: Two times a day (BID) | ORAL | Status: DC
  Administered 2023-01-17: 1000 mg via ORAL
  Filled 2023-01-17 (×2): qty 2

## 2023-01-17 MED ORDER — VALACYCLOVIR HCL 1 G PO TABS: 1000.0000 mg | ORAL_TABLET | Freq: Every day | ORAL | 0 refills | Status: AC

## 2023-01-17 MED ORDER — SODIUM CHLORIDE 0.9 % IV BOLUS
1000.0000 mL | Freq: Once | INTRAVENOUS | Status: AC
Start: 2023-01-17 — End: 2023-01-17
  Administered 2023-01-17: 1000 mL via INTRAVENOUS

## 2023-01-17 NOTE — ED Notes (Signed)
 Patient transported to CT

## 2023-01-17 NOTE — Telephone Encounter (Signed)
 Called and spoke with patients son about MyChart message from earlier. Picture of rash concerning for blistering/allergic reaction/possible SJS. He notes the rash is raised and on her back and legs, and that he has called her and is waiting for EMS to go to the hospital at [facility].   I discussed that I agree with going to the hospital as this is concerning for a severe allergic reaction and needs to be evaluated and likely admitted and monitored. Confirmed that she has tolerated both doxycycline  and Amoxicillin  in the past without issue and never had a reaction like this before.  He was thankful for our care for her.   Rollene Keeling MD

## 2023-01-17 NOTE — Discharge Instructions (Addendum)
 Please reach out to your primary care doctor's office to have Abigail Wallace's sodium level rechecked in 1 to 2 weeks.  You can consider a small increase in the salt in her diet, including a soup broth daily, or some salines added to a meal.  These changes should also be discussed with her primary care doctor, as too much salt could affect to Abigail Wallace's heart failure.  Her urinalysis did not show any clear sign of infection.  The CT scan of the abdomen does not show any emergencies.  She has a large hernia that is stable from before.

## 2023-01-17 NOTE — Telephone Encounter (Signed)
 Already called and spoke to patients son, EMS called and heading to the ED

## 2023-01-17 NOTE — Telephone Encounter (Signed)
 Patient's son returns call to nurse line. He has not viewed fpl group as of time of our conversation.   Informed him of message per Dr. Donzetta. He reports that he will stop the Augmentin , however, is asking if he should still continue the doxycycline .   We did schedule an appointment tomorrow afternoon with Dr. Orlando. If he is unable to obtain transportation, he will let us  know.   Please advise if patient should still continue the doxycycline .   [name], RN

## 2023-01-17 NOTE — ED Provider Notes (Signed)
 [place] EMERGENCY DEPARTMENT AT [facility] Provider Note   CSN: [id number] Arrival date & time: 01/17/23  1504     History  Chief Complaint  Patient presents with   Rash    Abigail Wallace is a 78 y.o. female with a history of dementia and a chronic indwelling Foley presenting to the ED with a rash.  The patient's son at bedside provides history.  He reports that her PCP has started the patient on amoxicillin  and doxycycline  about 4 days ago due to the patient being generally fatigued, feeling unwell.  She does have an indwelling Foley catheter which is now 11 weeks old and typically gets exchanged once a month.  She does get UTIs.  However they were concerned that the patient began having a blistering rash across her right flank and abdomen about 24 to 48 hours ago.  He says that the spread in a bandlike pattern across the right side of her abdomen to her right flank.  She does not have a rash anywhere else.  The patient is a level 5 caveat due to dementia  HPI     Home Medications Prior to Admission medications   Medication Sig Start Date End Date Taking? Authorizing Provider  gabapentin  (NEURONTIN ) 100 MG capsule Take 1 capsule (100 mg total) by mouth 3 (three) times daily for 21 doses. 01/17/23 01/24/23 Yes Jerica Creegan, Donnice PARAS, MD  valACYclovir  (VALTREX ) 1000 MG tablet Take 1 tablet (1,000 mg total) by mouth daily for 6 days. 01/18/23 01/24/23 Yes Rozell Theiler, Donnice PARAS, MD  acetaminophen  (TYLENOL ) 500 MG tablet Take 1,000 mg by mouth every 6 (six) hours as needed for mild pain or headache.    [provider]  albuterol  (VENTOLIN  HFA) 108 (90 Base) MCG/ACT inhaler Inhale 2 puffs into the lungs every 6 (six) hours as needed for wheezing or shortness of breath. 09/20/22   Delores Suzann HERO, MD  allopurinol  (ZYLOPRIM ) 100 MG tablet Take 1 tablet (100 mg total) by mouth daily. 04/16/22   Delores Suzann HERO, MD  amLODipine  (NORVASC ) 10 MG tablet Take 1 tablet (10 mg total) by mouth daily.  12/11/22   Delores Suzann HERO, MD  amoxicillin -clavulanate (AUGMENTIN ) 500-125 MG tablet Take 1 tablet by mouth in the morning and at bedtime for 7 days. 01/14/23 01/21/23  Donzetta Rollene BRAVO, MD  apixaban  (ELIQUIS ) 2.5 MG TABS tablet Take 1 tablet (2.5 mg total) by mouth 2 (two) times daily. 12/11/22   Delores Suzann HERO, MD  Blood Glucose Monitoring Suppl (ONETOUCH VERIO FLEX SYSTEM) w/Device KIT Use to check blood glucose once daily 11/15/22   Delores Suzann HERO, MD  Blood Glucose Monitoring Suppl (ONETOUCH VERIO) w/Device KIT Check blood sugar daily. E11.9 03/23/22   Jeanelle Layman CROME, MD  cholecalciferol  (VITAMIN D3) 25 MCG (1000 UNIT) tablet Take 1 tablet (1,000 Units total) by mouth daily. 04/16/22   Delores Suzann HERO, MD  diclofenac  Sodium (VOLTAREN ) 1 % GEL Apply 2 g topically 4 (four) times daily as needed (pain). 04/30/22   Delores Suzann HERO, MD  divalproex  (DEPAKOTE ) 250 MG DR tablet Take 1 tablet (250 mg total) by mouth every 12 (twelve) hours. 03/05/22   Samtani, Jai-Gurmukh, MD  doxycycline  (VIBRA -TABS) 100 MG tablet Take 1 tablet (100 mg total) by mouth 2 (two) times daily. 12/11/22   Delores Suzann HERO, MD  doxycycline  (VIBRA -TABS) 100 MG tablet Take 1 tablet (100 mg total) by mouth 2 (two) times daily for 7 days. 01/14/23 01/21/23  Donzetta Rollene BRAVO,  MD  Fluticasone -Umeclidin-Vilant (TRELEGY ELLIPTA ) 100-62.5-25 MCG/ACT AEPB Inhale 1 puff into the lungs daily. 12/12/22   Delores Suzann HERO, MD  furosemide  (LASIX ) 40 MG tablet Take 1 tablet (40 mg total) by mouth daily. 11/17/22 11/17/23  Delores Suzann HERO, MD  glucose blood [facility] VERIO) test strip Please use to check blood sugar once daily. E11.9 11/15/22   Delores Suzann HERO, MD  hydrocortisone  cream 0.5 % Apply 1 application topically 2 (two) times daily. Apply WITH Nystatin  under left breast 11/28/20   Delores Suzann HERO, MD  Incontinence Supplies MISC 1 Units by Does not apply route as needed. 12/13/15   McKeag, Chauncey BIRCH, MD  ipratropium-albuterol  (DUONEB) 0.5-2.5  (3) MG/3ML SOLN Take 3 mLs by nebulization every 4 (four) hours as needed. 09/13/22   Delores Suzann HERO, MD  lactulose  (CHRONULAC ) 10 GM/15ML solution Take 15 mLs (10 g total) by mouth 2 (two) times daily as needed for mild constipation. 09/11/22   Delores Suzann HERO, MD  Lancet Device MISC 1 Device by Does not apply route 3 (three) times a week. 06/01/22   Donzetta Rollene BRAVO, MD  metoprolol  tartrate (LOPRESSOR ) 100 MG tablet Take 1 tablet (100 mg total) by mouth 3 (three) times daily as needed. Patient taking differently: Take 100 mg by mouth 2 (two) times daily. 08/10/22   Croitoru, Mihai, MD  mupirocin  cream (BACTROBAN ) 2 % Apply 1 application. topically 2 (two) times daily. Patient taking differently: Apply 1 application  topically 2 (two) times daily as needed (to affected areas- for irritation). 03/24/21   Delores Suzann HERO, MD  nitroGLYCERIN  (NITROSTAT ) 0.4 MG SL tablet Place 1 tablet (0.4 mg total) under the tongue every 5 (five) minutes as needed for chest pain. 08/03/22 11/01/22  Croitoru, Mihai, MD  nystatin  (MYCOSTATIN /NYSTOP ) powder Apply 1 Application topically 3 (three) times daily. 10/23/22   Delores Suzann HERO, MD  nystatin  cream (MYCOSTATIN ) Apply 1 Application topically 2 (two) times daily. As needed in skin folds 11/01/22   Delores Suzann HERO, MD  OLANZapine  (ZYPREXA ) 5 MG tablet Take 5 mg by mouth at bedtime.    [provider]  OneTouch Delica Lancets 33G MISC Use to test once daily.  DX code:E11.9 06/01/22   Donzetta Rollene BRAVO, MD  OVER THE COUNTER MEDICATION Apply 1 application  topically See admin instructions. Caldesene Medicated Protecting Powder- Apply under the breasts and between the skin folds 2 times a day as needed for irritation    [provider]  polyethylene glycol powder (GLYCOLAX /MIRALAX ) 17 GM/SCOOP powder Take 17 g by mouth 3 (three) times daily. 12/11/22   Delores Suzann HERO, MD  pravastatin  (PRAVACHOL ) 40 MG tablet TAKE ONE TABLET BY MOUTH EVERY EVENING 11/19/22    Croitoru, Mihai, MD  sodium bicarbonate  650 MG tablet Take 1 tablet (650 mg total) by mouth 2 (two) times daily. 04/07/22   Delores Suzann HERO, MD  TRADJENTA  5 MG TABS tablet TAKE ONE TABLET BY MOUTH DAILY 05/18/22   Delores Suzann HERO, MD  triamcinolone  cream (KENALOG ) 0.1 % Apply 1 Application topically 2 (two) times daily. Apply to L ear 04/06/22   Delores Suzann HERO, MD      Allergies    Aripiprazole, Clozapine , Haloperidol  lactate, Benadryl [diphenhydramine hcl], Benztropine , Latuda [lurasidone hcl], Lorazepam , Remeron [mirtazapine], Keflex  [cephalexin ], Codeine, and Latex    Review of Systems   Review of Systems  Physical Exam Updated Vital Signs BP (!) 147/61 (BP Location: Left Arm)   Pulse 60   Temp 98.7 F (37.1  C) (Oral)   Resp 17   Ht 5' 6 (1.676 m)   Wt 135.2 kg   SpO2 94%   BMI 48.11 kg/m  Physical Exam Constitutional:      General: She is not in acute distress.    Appearance: She is obese.  HENT:     Head: Normocephalic and atraumatic.  Eyes:     Conjunctiva/sclera: Conjunctivae normal.     Pupils: Pupils are equal, round, and reactive to light.  Cardiovascular:     Rate and Rhythm: Normal rate and regular rhythm.  Pulmonary:     Effort: Pulmonary effort is normal. No respiratory distress.  Abdominal:     General: There is no distension.     Tenderness: There is no abdominal tenderness.  Skin:    General: Skin is warm and dry.     Comments: Blistering red rash in a dermatomal distribution across the right mid upper abdomen to the right flank  Neurological:     General: No focal deficit present.     Mental Status: She is alert. Mental status is at baseline.     ED Results / Procedures / Treatments   Labs (all labs ordered are listed, but only abnormal results are displayed) Labs Reviewed  BASIC METABOLIC PANEL - Abnormal; Notable for the following components:      Result Value   Sodium 125 (*)    Chloride 95 (*)    CO2 19 (*)    Glucose, Bld 118 (*)    BUN  31 (*)    Creatinine, Ser 1.92 (*)    GFR, Estimated 26 (*)    All other components within normal limits  CBC WITH DIFFERENTIAL/PLATELET - Abnormal; Notable for the following components:   Hemoglobin 11.2 (*)    HCT 33.1 (*)    RDW 15.9 (*)    Platelets 138 (*)    Abs Immature Granulocytes 0.34 (*)    All other components within normal limits  URINALYSIS, ROUTINE W REFLEX MICROSCOPIC - Abnormal; Notable for the following components:   Color, Urine STRAW (*)    APPearance HAZY (*)    Glucose, UA 50 (*)    Hgb urine dipstick SMALL (*)    Protein, ur 100 (*)    Bacteria, UA RARE (*)    All other components within normal limits  RESP PANEL BY RT-PCR (RSV, FLU A&B, COVID)  [id number]    EKG None  Radiology CT ABDOMEN PELVIS WO CONTRAST Result Date: 01/17/2023 CLINICAL DATA:  Acute abdominal pain EXAM: CT ABDOMEN AND PELVIS WITHOUT CONTRAST TECHNIQUE: Multidetector CT imaging of the abdomen and pelvis was performed following the standard protocol without IV contrast. RADIATION DOSE REDUCTION: This exam was performed according to the departmental dose-optimization program which includes automated exposure control, adjustment of the mA and/or kV according to patient size and/or use of iterative reconstruction technique. COMPARISON:  12/30/2021 FINDINGS: Lower chest: No acute abnormality. Hepatobiliary: No focal liver abnormality is seen. Status post cholecystectomy. No biliary dilatation. Pancreas: Unremarkable. No pancreatic ductal dilatation or surrounding inflammatory changes. Spleen: Normal in size without focal abnormality. Adrenals/Urinary Tract: Adrenal glands are within normal limits. Kidneys are well visualized bilaterally. Scattered cysts are noted stable in appearance from the prior exam. No follow-up is recommended. No renal calculi or obstructive changes are seen. The bladder is decompressed by Foley catheter. Stomach/Bowel: Scattered fecal material is noted within the colon. No  obstructive or inflammatory changes are seen. Mid transverse colon extends into a large anterior abdominal  wall hernia. The overall appearance is stable from the prior exam. The appendix is within normal limits. Small bowel and stomach are unremarkable. Vascular/Lymphatic: Aortic atherosclerosis. No enlarged abdominal or pelvic lymph nodes. Focal dilatation of the abdominal aorta to 3.3 cm is noted. Previous measurement technique was from the axial images somewhat over calculating the size. Reproductive: Uterus and bilateral adnexa are unremarkable. Other: No abdominal wall hernia or abnormality. No abdominopelvic ascites. Musculoskeletal: Degenerative changes of the right hip joint are seen. Lumbar spine degenerative changes are seen as well. IMPRESSION: Large anterior abdominal wall hernia containing the midportion of the transverse colon. These changes are stable in appearance. No obstructive changes seen. Abdominal aortic aneurysm measuring 3.3 cm. Recommend surveillance ultrasound in 3 years. Reference: Journal of Vascular Surgery 67.1 (2018): 2-77. J Am Coll Radiol [phone]. Electronically Signed   By: Oneil Devonshire M.D.   On: 01/17/2023 22:12    Procedures Procedures    Medications Ordered in ED Medications  gabapentin  (NEURONTIN ) capsule 300 mg (has no administration in time range)  valACYclovir  (VALTREX ) tablet 1,000 mg (has no administration in time range)  sodium chloride  0.9 % bolus 1,000 mL (0 mLs Intravenous Stopped 01/17/23 1943)  ketorolac  (TORADOL ) 30 MG/ML injection 30 mg (30 mg Intravenous Given 01/17/23 1845)    ED Course/ Medical Decision Making/ A&P Clinical Course as of 01/17/23 2247  Thu Jan 17, 2023  1740 Leukocytes,Ua: NEGATIVE [MT]  1740 Nitrite: NEGATIVE [MT]    Clinical Course User Index [MT] Cottie Donnice PARAS, MD                                 Medical Decision Making Amount and/or Complexity of Data Reviewed Labs: ordered. Decision-making details  documented in ED Course. Radiology: ordered.  Risk Prescription drug management.   Patient is presenting with suspected shingles rash outbreak in a dermatomal pattern across the mid abdomen.  I do not suspect Stevens-Johnson syndrome or drug reaction.  She is diabetic and they would prefer to avoid steroids which is reasonable.  We may attempt to start her on Valtrex , but we will check her kidney function, electrolytes, hemoglobin as her son reports she has had some weakness recently.  We can also exchange her Foley catheter which is about due to be changed, and check a UA.  Will check a flu and COVID test as well.    I reviewed external records including office note from her PCP today.  Supplemental history is provided by her son at the bedside.  I have a low suspicion for ACS, PE, pneumonia, or other life-threatening infection at this time.  *  Labs are notable for hyponatremia, likely related to poor oral intake.  Patient was given normal saline IV fluids and I discussed this with her son.  She is not on hydrochlorothiazide .  He says she has been on a lower 0 salt diet due to her heart failure, so this will be difficult balancing act, but she may benefit from a small amount of salt increase, and follow-up with her PCP.  The patient had then been complaining about joint pain which is chronic and abdominal pain.  It is difficult with her dementia to discern where her abdominal pain is coming from.  She does have a large hernia, and ultimately made a decision to pursue a CT scan of her abdomen, which I reviewed and showed no emergent findings.  I suspect more likely her  pain is coming from her shingles rash.  She was given Valtrex  here in the ED, which is dose adjusted to 1000 mg daily for 7 days.  She was also given gabapentin , which we can try at home as needed again for pain.  Patient will need transport back home as she is minimally ambulatory.  Her son was comfortable with the  plan.        Final Clinical Impression(s) / ED Diagnoses Final diagnoses:  Abdominal pain, unspecified abdominal location  Herpes zoster without complication  Hyponatremia    Rx / DC Orders ED Discharge Orders          Ordered    valACYclovir  (VALTREX ) 1000 MG tablet  Daily        01/17/23 2229    gabapentin  (NEURONTIN ) 100 MG capsule  3 times daily        01/17/23 2229              Cottie Donnice PARAS, MD 01/17/23 2247

## 2023-01-17 NOTE — ED Triage Notes (Signed)
 Rash to right leg x 1 week. On antibiotics for 4 days. Appears to be shingles.

## 2023-01-17 NOTE — ED Notes (Signed)
 Pt has scattered fluid filled blisters from right flank area to right back

## 2023-01-17 NOTE — ED Notes (Signed)
 Discharge instructions reviewed with pt / and son.   Newly prescribed medications discussed. Pharmacy verified for accuracy.   Alert and oriented at baseline. Son present and going home to prepare room for ambulance arrival.   Opportunity for questions and concerns provided.   Displays no signs of distress.

## 2023-01-17 NOTE — ED Notes (Signed)
 [facility] notified for pt transportation to home residence

## 2023-01-17 NOTE — Telephone Encounter (Signed)
 Called and provided verbal orders to [place] at [facility].   Veronda Prude, RN

## 2023-01-18 ENCOUNTER — Ambulatory Visit: Payer: 59 | Admitting: Student

## 2023-01-18 DIAGNOSIS — Z7401 Bed confinement status: Secondary | ICD-10-CM | POA: Diagnosis not present

## 2023-01-18 DIAGNOSIS — Z743 Need for continuous supervision: Secondary | ICD-10-CM | POA: Diagnosis not present

## 2023-01-18 DIAGNOSIS — R6889 Other general symptoms and signs: Secondary | ICD-10-CM | POA: Diagnosis not present

## 2023-01-19 ENCOUNTER — Telehealth: Payer: Self-pay | Admitting: Student

## 2023-01-19 NOTE — Telephone Encounter (Signed)
 Called pt's son who states pt's BP was on lower end for her  yesterday - as low at 112/59 yesterday so he held her Norvasc  last night. This morning BP is 137/61.  He is unable to assess her for dizziness given her baseline mental status.  She is bedbound so low concern for patient falling in setting of lower blood pressures/orthostatic hypotension. Currently taking gabapentin  100 mg TID which she just started yesterday evening for nerve pain related to shingles.  Son states Dr. Delores is planning to come for home visit soon but unsure when.  I did advise that he should check blood pressure once daily, record this and can call to schedule televisit appointment with our clinic sometime this week vs calling PCP to further discuss.  Based on BP readings this morning, I think it is okay if he continues to give Norvasc  daily while checking blood pressure as long as BP remains >110/55. I will route note to pt's PCP.

## 2023-01-21 ENCOUNTER — Telehealth: Payer: Self-pay | Admitting: Family Medicine

## 2023-01-21 NOTE — Telephone Encounter (Signed)
 Called son. Discussed BP and current pain control. Will call [name] to repeat labs ASAP  Terisa Starr, MD  [facility] Medicine Teaching Service

## 2023-01-22 NOTE — Telephone Encounter (Signed)
 Spoke with Dr. Manson Passey regarding patient. Dr. Manson Passey called and spoke with [facility] yesterday.   No further action required.   Veronda Prude, RN

## 2023-01-22 NOTE — Telephone Encounter (Signed)
 Received returned call from Colleen at [facility] regarding patient's labs. She reports that there was not a nurse available to see patient today. She is asking if it is okay to have labs drawn tomorrow at scheduled visit.   Spoke with Dr. Delores. She said that this was fine.   Called back and advised Elida of provider message.   Labs will be drawn at tomorrow's visit.   [name], RN

## 2023-01-23 DIAGNOSIS — I5043 Acute on chronic combined systolic (congestive) and diastolic (congestive) heart failure: Secondary | ICD-10-CM | POA: Diagnosis not present

## 2023-01-23 DIAGNOSIS — D631 Anemia in chronic kidney disease: Secondary | ICD-10-CM | POA: Diagnosis not present

## 2023-01-23 DIAGNOSIS — N184 Chronic kidney disease, stage 4 (severe): Secondary | ICD-10-CM | POA: Diagnosis not present

## 2023-01-23 DIAGNOSIS — I130 Hypertensive heart and chronic kidney disease with heart failure and stage 1 through stage 4 chronic kidney disease, or unspecified chronic kidney disease: Secondary | ICD-10-CM | POA: Diagnosis not present

## 2023-01-23 DIAGNOSIS — Z466 Encounter for fitting and adjustment of urinary device: Secondary | ICD-10-CM | POA: Diagnosis not present

## 2023-01-23 DIAGNOSIS — E1122 Type 2 diabetes mellitus with diabetic chronic kidney disease: Secondary | ICD-10-CM | POA: Diagnosis not present

## 2023-01-23 NOTE — Telephone Encounter (Signed)
 Received call from Rexene, RN [name] regarding patient. She was scheduled for a lab draw today. Attempted, flash of blood, unable to pull anything else. RN reports that patient is difficult stick. She contacted armed forces technical officer at [facility] to advise. They will try to get another nurse out to draw labs ASAP.   Re-certifying for another 60 days. Once monthly unless otherwise indicated. Provided verbal orders per protocol.   Forwarding to PCP.   [name], RN

## 2023-01-25 ENCOUNTER — Telehealth: Payer: Self-pay | Admitting: Family Medicine

## 2023-01-25 LAB — LAB REPORT - SCANNED: EGFR: 26

## 2023-01-25 MED ORDER — GABAPENTIN 100 MG PO CAPS: 100.0000 mg | ORAL_CAPSULE | Freq: Three times a day (TID) | ORAL | 0 refills | Status: DC

## 2023-01-25 NOTE — Telephone Encounter (Signed)
 Called son about this issue

## 2023-01-25 NOTE — Telephone Encounter (Addendum)
 Called and discussed pain-refilled gabapentin  Reviewed labs Repeat 1 month    Son also requests letter for [employer] given oxygen  need   Admin team- can you please mail the letter to their address on file?  Suzann Daring, MD  Family Medicine Teaching Service

## 2023-01-29 ENCOUNTER — Telehealth: Payer: Self-pay

## 2023-01-29 ENCOUNTER — Encounter: Payer: Self-pay | Admitting: Family Medicine

## 2023-01-29 NOTE — Telephone Encounter (Signed)
 [name] calls nurse line requesting to speak with PCP.   He is requesting next home visit apt information.   He reports he can not remember if she was coming out in January or February.   He reports patients shingles is improving.   Will forward to PCP for apt information.

## 2023-01-31 ENCOUNTER — Encounter: Payer: Self-pay | Admitting: Family Medicine

## 2023-02-03 DIAGNOSIS — J811 Chronic pulmonary edema: Secondary | ICD-10-CM | POA: Diagnosis not present

## 2023-02-04 ENCOUNTER — Encounter: Payer: Self-pay | Admitting: Family Medicine

## 2023-02-04 DIAGNOSIS — R0989 Other specified symptoms and signs involving the circulatory and respiratory systems: Secondary | ICD-10-CM

## 2023-02-04 DIAGNOSIS — J449 Chronic obstructive pulmonary disease, unspecified: Secondary | ICD-10-CM

## 2023-02-06 MED ORDER — ALBUTEROL SULFATE HFA 108 (90 BASE) MCG/ACT IN AERS: 2.0000 | INHALATION_SPRAY | Freq: Four times a day (QID) | RESPIRATORY_TRACT | 3 refills | Status: DC | PRN

## 2023-02-06 MED ORDER — OSELTAMIVIR PHOSPHATE 30 MG PO CAPS: 30.0000 mg | ORAL_CAPSULE | Freq: Every day | ORAL | 0 refills | Status: DC

## 2023-02-06 NOTE — Telephone Encounter (Signed)
Called son and discussed. Discussed going to ED for evaluation.   Started tamiflu 30 mg daily for influenza, given son has influenza and symptoms.   Discussed signs and symptoms to call and to go ED.   Terisa Starr, MD  Family Medicine Teaching Service

## 2023-02-06 NOTE — Progress Notes (Signed)
Remote pacemaker transmission.   

## 2023-02-06 NOTE — Addendum Note (Signed)
Addended by: Geralyn Flash D on: 02/06/2023 01:54 PM   Modules accepted: Orders

## 2023-02-08 ENCOUNTER — Telehealth: Payer: Self-pay | Admitting: Family Medicine

## 2023-02-08 ENCOUNTER — Encounter (HOSPITAL_COMMUNITY): Payer: Self-pay

## 2023-02-08 ENCOUNTER — Emergency Department (HOSPITAL_COMMUNITY): Payer: 59

## 2023-02-08 ENCOUNTER — Other Ambulatory Visit: Payer: Self-pay

## 2023-02-08 ENCOUNTER — Inpatient Hospital Stay (HOSPITAL_COMMUNITY)
Admission: EM | Admit: 2023-02-08 | Discharge: 2023-02-14 | DRG: 193 | Disposition: A | Discharge status: Home Health | Payer: 59 | Attending: Family Medicine | Admitting: Family Medicine

## 2023-02-08 DIAGNOSIS — I517 Cardiomegaly: Secondary | ICD-10-CM | POA: Diagnosis not present

## 2023-02-08 DIAGNOSIS — E871 Hypo-osmolality and hyponatremia: Secondary | ICD-10-CM | POA: Insufficient documentation

## 2023-02-08 DIAGNOSIS — I130 Hypertensive heart and chronic kidney disease with heart failure and stage 1 through stage 4 chronic kidney disease, or unspecified chronic kidney disease: Secondary | ICD-10-CM | POA: Diagnosis present

## 2023-02-08 DIAGNOSIS — Z950 Presence of cardiac pacemaker: Secondary | ICD-10-CM | POA: Diagnosis not present

## 2023-02-08 DIAGNOSIS — D631 Anemia in chronic kidney disease: Secondary | ICD-10-CM | POA: Diagnosis present

## 2023-02-08 DIAGNOSIS — J810 Acute pulmonary edema: Secondary | ICD-10-CM | POA: Diagnosis present

## 2023-02-08 DIAGNOSIS — R0602 Shortness of breath: Secondary | ICD-10-CM | POA: Diagnosis not present

## 2023-02-08 DIAGNOSIS — N184 Chronic kidney disease, stage 4 (severe): Secondary | ICD-10-CM | POA: Diagnosis present

## 2023-02-08 DIAGNOSIS — E1122 Type 2 diabetes mellitus with diabetic chronic kidney disease: Secondary | ICD-10-CM

## 2023-02-08 DIAGNOSIS — Z7984 Long term (current) use of oral hypoglycemic drugs: Secondary | ICD-10-CM | POA: Diagnosis not present

## 2023-02-08 DIAGNOSIS — Z87891 Personal history of nicotine dependence: Secondary | ICD-10-CM | POA: Diagnosis not present

## 2023-02-08 DIAGNOSIS — J1000 Influenza due to other identified influenza virus with unspecified type of pneumonia: Secondary | ICD-10-CM | POA: Diagnosis present

## 2023-02-08 DIAGNOSIS — I2510 Atherosclerotic heart disease of native coronary artery without angina pectoris: Secondary | ICD-10-CM | POA: Diagnosis present

## 2023-02-08 DIAGNOSIS — J440 Chronic obstructive pulmonary disease with acute lower respiratory infection: Secondary | ICD-10-CM | POA: Diagnosis present

## 2023-02-08 DIAGNOSIS — E66813 Obesity, class 3: Secondary | ICD-10-CM | POA: Diagnosis present

## 2023-02-08 DIAGNOSIS — Z6841 Body Mass Index (BMI) 40.0 and over, adult: Secondary | ICD-10-CM | POA: Diagnosis not present

## 2023-02-08 DIAGNOSIS — J189 Pneumonia, unspecified organism: Secondary | ICD-10-CM

## 2023-02-08 DIAGNOSIS — F03A18 Unspecified dementia, mild, with other behavioral disturbance: Secondary | ICD-10-CM | POA: Diagnosis present

## 2023-02-08 DIAGNOSIS — I700 Atherosclerosis of aorta: Secondary | ICD-10-CM | POA: Diagnosis not present

## 2023-02-08 DIAGNOSIS — M109 Gout, unspecified: Secondary | ICD-10-CM | POA: Diagnosis present

## 2023-02-08 DIAGNOSIS — I5023 Acute on chronic systolic (congestive) heart failure: Secondary | ICD-10-CM | POA: Diagnosis present

## 2023-02-08 DIAGNOSIS — I4891 Unspecified atrial fibrillation: Secondary | ICD-10-CM | POA: Diagnosis present

## 2023-02-08 DIAGNOSIS — I499 Cardiac arrhythmia, unspecified: Secondary | ICD-10-CM | POA: Diagnosis not present

## 2023-02-08 DIAGNOSIS — N179 Acute kidney failure, unspecified: Secondary | ICD-10-CM | POA: Insufficient documentation

## 2023-02-08 DIAGNOSIS — J9601 Acute respiratory failure with hypoxia: Secondary | ICD-10-CM

## 2023-02-08 DIAGNOSIS — F209 Schizophrenia, unspecified: Secondary | ICD-10-CM | POA: Diagnosis present

## 2023-02-08 DIAGNOSIS — I959 Hypotension, unspecified: Secondary | ICD-10-CM | POA: Insufficient documentation

## 2023-02-08 DIAGNOSIS — R0989 Other specified symptoms and signs involving the circulatory and respiratory systems: Secondary | ICD-10-CM | POA: Diagnosis not present

## 2023-02-08 DIAGNOSIS — Z7901 Long term (current) use of anticoagulants: Secondary | ICD-10-CM

## 2023-02-08 DIAGNOSIS — E785 Hyperlipidemia, unspecified: Secondary | ICD-10-CM | POA: Diagnosis present

## 2023-02-08 DIAGNOSIS — J9621 Acute and chronic respiratory failure with hypoxia: Secondary | ICD-10-CM | POA: Diagnosis not present

## 2023-02-08 DIAGNOSIS — K5900 Constipation, unspecified: Secondary | ICD-10-CM | POA: Diagnosis present

## 2023-02-08 DIAGNOSIS — J111 Influenza due to unidentified influenza virus with other respiratory manifestations: Secondary | ICD-10-CM | POA: Diagnosis not present

## 2023-02-08 DIAGNOSIS — Z7401 Bed confinement status: Secondary | ICD-10-CM | POA: Diagnosis not present

## 2023-02-08 DIAGNOSIS — R339 Retention of urine, unspecified: Secondary | ICD-10-CM | POA: Diagnosis present

## 2023-02-08 DIAGNOSIS — J441 Chronic obstructive pulmonary disease with (acute) exacerbation: Secondary | ICD-10-CM | POA: Diagnosis present

## 2023-02-08 DIAGNOSIS — E119 Type 2 diabetes mellitus without complications: Secondary | ICD-10-CM

## 2023-02-08 DIAGNOSIS — Z79899 Other long term (current) drug therapy: Secondary | ICD-10-CM

## 2023-02-08 DIAGNOSIS — R059 Cough, unspecified: Secondary | ICD-10-CM | POA: Diagnosis not present

## 2023-02-08 DIAGNOSIS — R404 Transient alteration of awareness: Secondary | ICD-10-CM | POA: Diagnosis not present

## 2023-02-08 DIAGNOSIS — R0689 Other abnormalities of breathing: Secondary | ICD-10-CM | POA: Diagnosis not present

## 2023-02-08 LAB — CBC
HCT: 31.4 % — ABNORMAL LOW (ref 36.0–46.0)
Hemoglobin: 10.3 g/dL — ABNORMAL LOW (ref 12.0–15.0)
MCH: 28.3 pg (ref 26.0–34.0)
MCHC: 32.8 g/dL (ref 30.0–36.0)
MCV: 86.3 fL (ref 80.0–100.0)
Platelets: 201 10*3/uL (ref 150–400)
RBC: 3.64 MIL/uL — ABNORMAL LOW (ref 3.87–5.11)
RDW: 16.1 % — ABNORMAL HIGH (ref 11.5–15.5)
WBC: 11.7 10*3/uL — ABNORMAL HIGH (ref 4.0–10.5)
nRBC: 0 % (ref 0.0–0.2)

## 2023-02-08 LAB — COMPREHENSIVE METABOLIC PANEL
ALT: 14 U/L (ref 0–44)
AST: 16 U/L (ref 15–41)
Albumin: 3.2 g/dL — ABNORMAL LOW (ref 3.5–5.0)
Alkaline Phosphatase: 47 U/L (ref 38–126)
Anion gap: 11 (ref 5–15)
BUN: 41 mg/dL — ABNORMAL HIGH (ref 8–23)
CO2: 21 mmol/L — ABNORMAL LOW (ref 22–32)
Calcium: 9.6 mg/dL (ref 8.9–10.3)
Chloride: 94 mmol/L — ABNORMAL LOW (ref 98–111)
Creatinine, Ser: 2.11 mg/dL — ABNORMAL HIGH (ref 0.44–1.00)
GFR, Estimated: 23 mL/min — ABNORMAL LOW (ref >60)
Glucose, Bld: 154 mg/dL — ABNORMAL HIGH (ref 70–99)
Potassium: 4.1 mmol/L (ref 3.5–5.1)
Sodium: 126 mmol/L — ABNORMAL LOW (ref 135–145)
Total Bilirubin: 0.3 mg/dL (ref 0.0–1.2)
Total Protein: 6.8 g/dL (ref 6.5–8.1)

## 2023-02-08 LAB — BRAIN NATRIURETIC PEPTIDE: B Natriuretic Peptide: 470.1 pg/mL — ABNORMAL HIGH (ref 0.0–100.0)

## 2023-02-08 LAB — RESP PANEL BY RT-PCR (RSV, FLU A&B, COVID)  RVPGX2
Influenza A by PCR: POSITIVE — AB
Influenza B by PCR: NEGATIVE
Resp Syncytial Virus by PCR: NEGATIVE
SARS Coronavirus 2 by RT PCR: NEGATIVE

## 2023-02-08 MED ORDER — IPRATROPIUM-ALBUTEROL 0.5-2.5 (3) MG/3ML IN SOLN
3.0000 mL | RESPIRATORY_TRACT | Status: DC | PRN
  Administered 2023-02-09 – 2023-02-12 (×5): 3 mL via RESPIRATORY_TRACT
  Filled 2023-02-08 (×5): qty 3

## 2023-02-08 MED ORDER — GABAPENTIN 100 MG PO CAPS
100.0000 mg | ORAL_CAPSULE | Freq: Three times a day (TID) | ORAL | Status: DC
  Administered 2023-02-08: 100 mg via ORAL
  Filled 2023-02-08 (×6): qty 1

## 2023-02-08 MED ORDER — SODIUM BICARBONATE 650 MG PO TABS
650.0000 mg | ORAL_TABLET | Freq: Two times a day (BID) | ORAL | Status: DC
  Administered 2023-02-08 – 2023-02-14 (×12): 650 mg via ORAL
  Filled 2023-02-08 (×12): qty 1

## 2023-02-08 MED ORDER — FLUTICASONE FUROATE-VILANTEROL 100-25 MCG/ACT IN AEPB
1.0000 | INHALATION_SPRAY | Freq: Every day | RESPIRATORY_TRACT | Status: DC
  Administered 2023-02-10 – 2023-02-14 (×5): 1 via RESPIRATORY_TRACT
  Filled 2023-02-08: qty 28

## 2023-02-08 MED ORDER — APIXABAN 2.5 MG PO TABS
2.5000 mg | ORAL_TABLET | Freq: Two times a day (BID) | ORAL | Status: DC
  Administered 2023-02-08 – 2023-02-14 (×12): 2.5 mg via ORAL
  Filled 2023-02-08 (×12): qty 1

## 2023-02-08 MED ORDER — OLANZAPINE 5 MG PO TABS
7.5000 mg | ORAL_TABLET | Freq: Every day | ORAL | Status: DC
  Administered 2023-02-08 – 2023-02-13 (×6): 7.5 mg via ORAL
  Filled 2023-02-08 (×5): qty 2
  Filled 2023-02-08: qty 1

## 2023-02-08 MED ORDER — PRAVASTATIN SODIUM 40 MG PO TABS
40.0000 mg | ORAL_TABLET | Freq: Every evening | ORAL | Status: DC
  Administered 2023-02-08 – 2023-02-13 (×6): 40 mg via ORAL
  Filled 2023-02-08 (×6): qty 1

## 2023-02-08 MED ORDER — AMLODIPINE BESYLATE 10 MG PO TABS
10.0000 mg | ORAL_TABLET | Freq: Every day | ORAL | Status: DC
  Administered 2023-02-09 – 2023-02-10 (×2): 10 mg via ORAL
  Filled 2023-02-08 (×3): qty 1

## 2023-02-08 MED ORDER — ALBUTEROL SULFATE (2.5 MG/3ML) 0.083% IN NEBU
INHALATION_SOLUTION | RESPIRATORY_TRACT | Status: AC
  Filled 2023-02-08: qty 3

## 2023-02-08 MED ORDER — IPRATROPIUM BROMIDE 0.02 % IN SOLN
0.5000 mg | Freq: Once | RESPIRATORY_TRACT | Status: AC
  Administered 2023-02-08: 0.5 mg via RESPIRATORY_TRACT
  Filled 2023-02-08: qty 2.5

## 2023-02-08 MED ORDER — VITAMIN D 25 MCG (1000 UNIT) PO TABS
1000.0000 [IU] | ORAL_TABLET | Freq: Every day | ORAL | Status: DC
Start: 2023-02-09 — End: 2023-02-14
  Administered 2023-02-09 – 2023-02-14 (×6): 1000 [IU] via ORAL
  Filled 2023-02-08 (×6): qty 1

## 2023-02-08 MED ORDER — UMECLIDINIUM BROMIDE 62.5 MCG/ACT IN AEPB
1.0000 | INHALATION_SPRAY | Freq: Every day | RESPIRATORY_TRACT | Status: DC
  Administered 2023-02-10 – 2023-02-14 (×5): 1 via RESPIRATORY_TRACT
  Filled 2023-02-08: qty 7

## 2023-02-08 MED ORDER — ALBUTEROL SULFATE (2.5 MG/3ML) 0.083% IN NEBU
5.0000 mg | INHALATION_SOLUTION | Freq: Once | RESPIRATORY_TRACT | Status: AC
  Administered 2023-02-08: 5 mg via RESPIRATORY_TRACT
  Filled 2023-02-08: qty 6

## 2023-02-08 MED ORDER — LINAGLIPTIN 5 MG PO TABS
5.0000 mg | ORAL_TABLET | Freq: Every day | ORAL | Status: DC
  Administered 2023-02-09 – 2023-02-14 (×6): 5 mg via ORAL
  Filled 2023-02-08 (×8): qty 1

## 2023-02-08 MED ORDER — ACETAMINOPHEN 500 MG PO TABS
1000.0000 mg | ORAL_TABLET | Freq: Four times a day (QID) | ORAL | Status: DC | PRN
  Administered 2023-02-08 – 2023-02-14 (×12): 1000 mg via ORAL
  Filled 2023-02-08 (×12): qty 2

## 2023-02-08 MED ORDER — METOPROLOL TARTRATE 50 MG PO TABS
100.0000 mg | ORAL_TABLET | Freq: Three times a day (TID) | ORAL | Status: DC
  Administered 2023-02-08 – 2023-02-10 (×6): 100 mg via ORAL
  Filled 2023-02-08 (×4): qty 2
  Filled 2023-02-08: qty 4
  Filled 2023-02-08 (×2): qty 2

## 2023-02-08 MED ORDER — FUROSEMIDE 10 MG/ML IJ SOLN
40.0000 mg | Freq: Once | INTRAMUSCULAR | Status: AC
  Administered 2023-02-08: 40 mg via INTRAVENOUS
  Filled 2023-02-08: qty 4

## 2023-02-08 MED ORDER — ALLOPURINOL 100 MG PO TABS
100.0000 mg | ORAL_TABLET | Freq: Every day | ORAL | Status: DC
  Administered 2023-02-09 – 2023-02-14 (×6): 100 mg via ORAL
  Filled 2023-02-08 (×6): qty 1

## 2023-02-08 MED ORDER — DIVALPROEX SODIUM 250 MG PO DR TAB
250.0000 mg | DELAYED_RELEASE_TABLET | Freq: Two times a day (BID) | ORAL | Status: DC
  Administered 2023-02-08 – 2023-02-14 (×12): 250 mg via ORAL
  Filled 2023-02-08 (×12): qty 1

## 2023-02-08 MED ORDER — LEVOFLOXACIN IN D5W 750 MG/150ML IV SOLN
750.0000 mg | Freq: Once | INTRAVENOUS | Status: AC
  Administered 2023-02-08: 750 mg via INTRAVENOUS
  Filled 2023-02-08: qty 150

## 2023-02-08 MED ORDER — OSELTAMIVIR PHOSPHATE 30 MG PO CAPS
30.0000 mg | ORAL_CAPSULE | Freq: Every day | ORAL | Status: AC
  Administered 2023-02-09 – 2023-02-11 (×3): 30 mg via ORAL
  Filled 2023-02-08 (×5): qty 1

## 2023-02-08 NOTE — Assessment & Plan Note (Addendum)
Presenting with 2 weeks of worsening breathing, cough, fever, and increased O2 requirement iso Influenza A. BNP 470, CXR with vascular congestion. Afebrile with mild leukocytosis 11.7. Levaquin started in ED. - Admit to Med-Surg with [facility], attending Dr Pollie Meyer - Received 750 Levofloxacin in ED - consider continued abx   - Consider steroids if worsening - Procal ordered  - S/p 60 IV lasix - redose as indicated  - Cont formulary equivalent of home Trelegy (Breo and Incruse Ellipta)  - Oxygen supplementation - goal 88-92% - Strict I/Os - Daily weights  - Vitals per floor - AM BMP, CBC

## 2023-02-08 NOTE — Telephone Encounter (Signed)
Called son. Patient's level of alertness changing. Instructed to proceed to ED for evaluation, anticipate admission to [facility]. Called ED charge to let them know.  Terisa Starr, MD  Family Medicine Teaching Service

## 2023-02-08 NOTE — Telephone Encounter (Signed)
Patient LVM on nurse line requesting returned call to provide update on patient.   Returned call to [name].   Patient is currently on 2 L of oxygen nasal cannula. Without oxygen, O2 levels are around 90%. With oxygen around 94%. Albuterol Neb treatments every 3 hours.    She seems to be her normal self when she is awake.   She is still eating and drinking normally. Reports normal output via catheter.   He states that she is wheezing and has been wheezing for about one week now. Denies shortness of breath, retractions, tachypnea.   She is adamant that she does not want to go to the doctor.   [name] is asking for any recommendations from provider that he could try to keep patient out of the ED. Unsure if refill on duonebs would be helpful.   Will forward to Dr. Manson Passey for further advisement.   ED precautions discussed.   Veronda Prude, RN

## 2023-02-08 NOTE — Assessment & Plan Note (Signed)
Na 126 on admission. May be chronic, has recent history of hyponatremia to 125-133.  - CTM  - AM BMP - If not improving, consider obtaining Uosm, Serium Osm, Una.

## 2023-02-08 NOTE — ED Triage Notes (Signed)
Pt BIB [facility] from home d/t SOB after Dx with the flu 3 days ago. Family on scene reports her PCP said to go into ED if her sats dropped below 90% & upon their arrival she was wearing her baseline 2L O2 via n/c & sating at 95% & wanted to come in for eval d/t still feeling SOB & some dizziness. EMS reports she had rales all over & was given 12L O2 via [facility] en route to ED & she was sating at 98%. Pt does have a pacemaker, 136/58, CBG 140, A/Ox4.

## 2023-02-08 NOTE — Telephone Encounter (Signed)
Patient's son returns call to nurse line.   He is asking to speak with Dr. Manson Passey regarding direct admission. He does not want patient to have to sit through 12 plus hours in the ED, when they know what is going on with her.   Advised of typical process of being evaluated in the ED prior to admission. He is concerned that the ED would cause patient to suffer more trauma.   Forwarding to Dr. Manson Passey.   Veronda Prude, RN

## 2023-02-08 NOTE — Telephone Encounter (Signed)
Spoke with son at 1236  Terisa Starr, MD  [facility] Medicine Teaching Service

## 2023-02-08 NOTE — ED Provider Notes (Signed)
[place] EMERGENCY DEPARTMENT AT [facility] Provider Note   CSN: [id number] Arrival date & time: 02/08/23  1439     History  Chief Complaint  Patient presents with   Shortness of Breath   Dizziness    Abigail Wallace is a 78 y.o. female.  Patient is a 78 year old female with a past medical history of COPD on 2 L nasal cannula as needed, hypertension, diabetes, CHF, mild dementia, schizophrenia, urinary retention with chronic Foley catheter in place presenting to the emergency department with shortness of breath.  Patient is here with her son who states that Abigail Wallace recently got over shingles and that he had the flu last week and that for the last 3 days Abigail Wallace has had increasing cough and shortness of breath.  He states that he has been wheezing and needing her nebulizers more often.  He states that he has been monitoring her vitals at home and her oxygen levels have been low around 90% when normally Abigail Wallace is 96% on room air.  Abigail Wallace states that Abigail Wallace did have a fever few days ago.  He states that Abigail Wallace is coughing up mucus.  He states that they spoke with her primary doctor and recommended that Abigail Wallace come to the ER due to her worsening symptoms.  The history is provided by a relative.  Shortness of Breath Dizziness Associated symptoms: shortness of breath        Home Medications Prior to Admission medications   Medication Sig Start Date End Date Taking? Authorizing Provider  acetaminophen (TYLENOL) 500 MG tablet Take 1,000 mg by mouth every 6 (six) hours as needed for mild pain or headache.    [provider]  albuterol (VENTOLIN HFA) 108 (90 Base) MCG/ACT inhaler Inhale 2 puffs into the lungs every 6 (six) hours as needed for wheezing or shortness of breath. 02/06/23   Westley Chandler, MD  allopurinol (ZYLOPRIM) 100 MG tablet Take 1 tablet (100 mg total) by mouth daily. 04/16/22   Westley Chandler, MD  amLODipine (NORVASC) 10 MG tablet Take 1 tablet (10 mg total) by mouth daily.  12/11/22   Westley Chandler, MD  apixaban (ELIQUIS) 2.5 MG TABS tablet Take 1 tablet (2.5 mg total) by mouth 2 (two) times daily. 12/11/22   Westley Chandler, MD  Blood Glucose Monitoring Suppl (ONETOUCH VERIO FLEX SYSTEM) w/Device KIT Use to check blood glucose once daily 11/15/22   Westley Chandler, MD  Blood Glucose Monitoring Suppl (ONETOUCH VERIO) w/Device KIT Check blood sugar daily. E11.9 03/23/22   Carney Living, MD  cholecalciferol (VITAMIN D3) 25 MCG (1000 UNIT) tablet Take 1 tablet (1,000 Units total) by mouth daily. 04/16/22   Westley Chandler, MD  diclofenac Sodium (VOLTAREN) 1 % GEL Apply 2 g topically 4 (four) times daily as needed (pain). 04/30/22   Westley Chandler, MD  divalproex (DEPAKOTE) 250 MG DR tablet Take 1 tablet (250 mg total) by mouth every 12 (twelve) hours. 03/05/22   Rhetta Mura, MD  doxycycline (VIBRA-TABS) 100 MG tablet Take 1 tablet (100 mg total) by mouth 2 (two) times daily. 12/11/22   Westley Chandler, MD  Fluticasone-Umeclidin-Vilant (TRELEGY ELLIPTA) 100-62.5-25 MCG/ACT AEPB Inhale 1 puff into the lungs daily. 12/12/22   Westley Chandler, MD  furosemide (LASIX) 40 MG tablet Take 1 tablet (40 mg total) by mouth daily. 11/17/22 11/17/23  Westley Chandler, MD  gabapentin (NEURONTIN) 100 MG capsule Take 1 capsule (100 mg total) by mouth 3 (  three) times daily for 21 doses. 01/25/23 02/01/23  Westley Chandler, MD  glucose blood [facility] VERIO) test strip Please use to check blood sugar once daily. E11.9 11/15/22   Westley Chandler, MD  hydrocortisone cream 0.5 % Apply 1 application topically 2 (two) times daily. Apply WITH Nystatin under left breast 11/28/20   Westley Chandler, MD  Incontinence Supplies MISC 1 Units by Does not apply route as needed. 12/13/15   McKeag, Janine Ores, MD  ipratropium-albuterol (DUONEB) 0.5-2.5 (3) MG/3ML SOLN Take 3 mLs by nebulization every 4 (four) hours as needed. 09/13/22   Westley Chandler, MD  lactulose (CHRONULAC) 10 GM/15ML solution Take 15  mLs (10 g total) by mouth 2 (two) times daily as needed for mild constipation. 09/11/22   Westley Chandler, MD  Lancet Device MISC 1 Device by Does not apply route 3 (three) times a week. 06/01/22   Billey Co, MD  metoprolol tartrate (LOPRESSOR) 100 MG tablet Take 1 tablet (100 mg total) by mouth 3 (three) times daily as needed. Patient taking differently: Take 100 mg by mouth 2 (two) times daily. 08/10/22   Croitoru, Mihai, MD  mupirocin cream (BACTROBAN) 2 % Apply 1 application. topically 2 (two) times daily. Patient taking differently: Apply 1 application  topically 2 (two) times daily as needed (to affected areas- for irritation). 03/24/21   Westley Chandler, MD  nitroGLYCERIN (NITROSTAT) 0.4 MG SL tablet Place 1 tablet (0.4 mg total) under the tongue every 5 (five) minutes as needed for chest pain. 08/03/22 11/01/22  Croitoru, Mihai, MD  nystatin (MYCOSTATIN/NYSTOP) powder Apply 1 Application topically 3 (three) times daily. 10/23/22   Westley Chandler, MD  nystatin cream (MYCOSTATIN) Apply 1 Application topically 2 (two) times daily. As needed in skin folds 11/01/22   Westley Chandler, MD  OLANZapine (ZYPREXA) 5 MG tablet Take 5 mg by mouth at bedtime.    [provider]  OneTouch Delica Lancets 33G MISC Use to test once daily.  DX code:E11.9 06/01/22   Billey Co, MD  oseltamivir (TAMIFLU) 30 MG capsule Take 1 capsule (30 mg total) by mouth daily. 02/06/23   Westley Chandler, MD  OVER THE COUNTER MEDICATION Apply 1 application  topically See admin instructions. Caldesene Medicated Protecting Powder- Apply under the breasts and between the skin folds 2 times a day as needed for irritation    [provider]  polyethylene glycol powder (GLYCOLAX/MIRALAX) 17 GM/SCOOP powder Take 17 g by mouth 3 (three) times daily. 12/11/22   Westley Chandler, MD  pravastatin (PRAVACHOL) 40 MG tablet TAKE ONE TABLET BY MOUTH EVERY EVENING 11/19/22   Croitoru, Mihai, MD  sodium bicarbonate 650 MG  tablet Take 1 tablet (650 mg total) by mouth 2 (two) times daily. 04/07/22   Westley Chandler, MD  TRADJENTA 5 MG TABS tablet TAKE ONE TABLET BY MOUTH DAILY 05/18/22   Westley Chandler, MD  triamcinolone cream (KENALOG) 0.1 % Apply 1 Application topically 2 (two) times daily. Apply to L ear 04/06/22   Westley Chandler, MD      Allergies    Aripiprazole, Clozapine, Haloperidol lactate, Benadryl [diphenhydramine hcl], Benztropine, Latuda [lurasidone hcl], Lorazepam, Remeron [mirtazapine], Keflex [cephalexin], Codeine, and Latex    Review of Systems   Review of Systems  Respiratory:  Positive for shortness of breath.   Neurological:  Positive for dizziness.    Physical Exam Updated Vital Signs BP 124/68   Pulse 84   Temp  98.7 F (37.1 C) (Tympanic)   Resp 14   Ht 5\' 6"  (1.676 m)   Wt (!) 138.3 kg   SpO2 96%   BMI 49.23 kg/m  Physical Exam Vitals and nursing note reviewed.  Constitutional:      General: Abigail Wallace is not in acute distress.    Appearance: Abigail Wallace is well-developed.  HENT:     Head: Normocephalic and atraumatic.     Mouth/Throat:     Mouth: Mucous membranes are moist.  Eyes:     Extraocular Movements: Extraocular movements intact.  Cardiovascular:     Rate and Rhythm: Normal rate and regular rhythm.     Heart sounds: Normal heart sounds.  Pulmonary:     Effort: Tachypnea present. No accessory muscle usage.     Breath sounds: Examination of the right-middle field reveals rales. Examination of the right-lower field reveals rales. Wheezing (Diffuse end expiratory) and rales present.  Abdominal:     Palpations: Abdomen is soft.     Tenderness: There is no abdominal tenderness.  Musculoskeletal:        General: Normal range of motion.     Cervical back: Normal range of motion and neck supple.     Right lower leg: No edema.     Left lower leg: No edema.  Skin:    General: Skin is warm and dry.  Neurological:     General: No focal deficit present.     Mental Status: Abigail Wallace is  alert.  Psychiatric:        Mood and Affect: Mood normal.        Behavior: Behavior normal.     ED Results / Procedures / Treatments   Labs (all labs ordered are listed, but only abnormal results are displayed) Labs Reviewed  RESP PANEL BY RT-PCR (RSV, FLU A&B, COVID)  RVPGX2 - Abnormal; Notable for the following components:      Result Value   Influenza A by PCR POSITIVE (*)    All other components within normal limits  CBC - Abnormal; Notable for the following components:   WBC 11.7 (*)    RBC 3.64 (*)    Hemoglobin 10.3 (*)    HCT 31.4 (*)    RDW 16.1 (*)    All other components within normal limits  COMPREHENSIVE METABOLIC PANEL - Abnormal; Notable for the following components:   Sodium 126 (*)    Chloride 94 (*)    CO2 21 (*)    Glucose, Bld 154 (*)    BUN 41 (*)    Creatinine, Ser 2.11 (*)    Albumin 3.2 (*)    GFR, Estimated 23 (*)    All other components within normal limits  BRAIN NATRIURETIC PEPTIDE - Abnormal; Notable for the following components:   B Natriuretic Peptide 470.1 (*)    All other components within normal limits    EKG EKG Interpretation Date/Time:  Friday February 08 2023 14:49:43 EST Ventricular Rate:  84 PR Interval:    QRS Duration:  181 QT Interval:  448 QTC Calculation: 530 R Axis:   -56  Text Interpretation: Electronic ventricular pacemaker Confirmed by Cathren Laine ([id number]) on 02/08/2023 2:55:01 PM  Radiology DG Chest Port 1 View Result Date: 02/08/2023 CLINICAL DATA:  Cough. EXAM: PORTABLE CHEST 1 VIEW COMPARISON:  10/12/2022. FINDINGS: Stable cardiomegaly. Stable left-sided pacemaker in place. Aortic atherosclerosis. Pulmonary vascular congestion with diffuse bilateral interstitial opacities, most pronounced at the lung bases. No acute osseous abnormality. IMPRESSION: Stable cardiomegaly with pulmonary  vascular congestion and findings suggestive of interstitial edema. Electronically Signed   By: Hart Robinsons M.D.   On: 02/08/2023  15:56    Procedures Procedures    Medications Ordered in ED Medications  albuterol (PROVENTIL) (2.5 MG/3ML) 0.083% nebulizer solution (has no administration in time range)  levofloxacin (LEVAQUIN) IVPB 750 mg (has no administration in time range)  furosemide (LASIX) injection 40 mg (has no administration in time range)  albuterol (PROVENTIL) (2.5 MG/3ML) 0.083% nebulizer solution 5 mg (5 mg Nebulization Given 02/08/23 1548)  ipratropium (ATROVENT) nebulizer solution 0.5 mg (0.5 mg Nebulization Given 02/08/23 1548)    ED Course/ Medical Decision Making/ A&P Clinical Course as of 02/08/23 1635  Fri Feb 08, 2023  1606 CXR report concern for interstitial edema, BNP mildly elevated however with symptoms and leukocytosis have higher suspicion of a pneumonia and will be given antibitiocs [VK]    Clinical Course User Index [VK] Rexford Maus, DO                                 Medical Decision Making This patient presents to the ED with chief complaint(s) of cough, shortness of breath with pertinent past medical history of COPD on 2 L nasal cannula as needed, hypertension, diabetes, CHF, CKD, schizophrenia, mild dementia which further complicates the presenting complaint. The complaint involves an extensive differential diagnosis and also carries with it a high risk of complications and morbidity.    The differential diagnosis includes viral syndrome, pneumonia, pneumothorax, pulmonary edema, pleural effusion, COPD exacerbation, ACS, arrhythmia, anemia, sepsis  Additional history obtained: Additional history obtained from family Records reviewed Primary Care Documents  ED Course and Reassessment: On patient's arrival Abigail Wallace is hemodynamically stable, satting well on 4 L nasal cannula.  He was mildly tachypneic on my evaluation in the room with some expiratory wheeze and crackles on the right side of the lungs.  Abigail Wallace had EKG on arrival that showed a ventricularly paced rhythm without  acute ischemic changes and labs including viral swab and chest x-ray ordered.  Patient will be closely reassessed.  Independent labs interpretation:  The following labs were independently interpreted: mild hyponatremia, mildly elevated BNP, leukocytosis, flu +, otherwise labs at baseline  Independent visualization of imaging: - I independently visualized the following imaging with scope of interpretation limited to determining acute life threatening conditions related to emergency care: CXR, which revealed bilateral pulmonary edema vs multifocal pneumonia  Consultation: - Consulted or discussed management/test interpretation w/ external professional: family medicine  Consideration for admission or further workup: patient requires admission for acute on chronic hypoxic respiratory failure Social Determinants of health: N/A    Risk Prescription drug management. Decision regarding hospitalization.          Final Clinical Impression(s) / ED Diagnoses Final diagnoses:  Influenza  Acute on chronic hypoxic respiratory failure (HCC)  Multifocal pneumonia  Acute pulmonary edema [facility])    Rx / DC Orders ED Discharge Orders     None         Rexford Maus, DO 02/08/23 1635

## 2023-02-08 NOTE — Assessment & Plan Note (Signed)
Presenting with 2 weeks of worsening breathing, cough, fever, and increased O2 requirement iso Influenza A. Concern for possibility of PNA as well. May benefit from course of abx.  - Admit to Med-Surg with FMTS, attending Dr Pollie Meyer - Caution with floxacin due to QTc - Consider steroids if worsening - Procal pending  - Redose lasix - Cont formulary equivalent of home Trelegy (Breo and Incruse Ellipta)  - Oxygen supplementation - goal 88-92% - Strict I/Os - Daily weights  - Vitals per floor - AM BMP, CBC

## 2023-02-08 NOTE — Assessment & Plan Note (Signed)
CBG stable on admission. Home meds include: Tradjenta 5 daily. Last A1c 6.3 in Sept 2024.  - Continue home Tradjenta - CBGs q4

## 2023-02-08 NOTE — Progress Notes (Signed)
  ***   Daily Progress Note Intern Pager: [phone]  Patient name: Raygan Skarda Medical record number: [id number] Date of birth: April 19, 1945 Age: 78 y.o. Gender: female  Primary Care Provider: Westley Chandler, MD Consultants: None  Code Status: FULL   Pt Overview and Major Events to Date:  02/08/23: Admit to FMTS   Assessment and Plan:  Shalene Gallen is a 78 y.o. female presenting with acute hypoxic respiratory failure with PMH/PSH includes COPD on 2 L nasal cannula as needed, hypertension, diabetes, CHF, mild dementia, schizophrenia, urinary retention with chronic foley catheter in place.  Assessment & Plan Acute hypoxic respiratory failure (HCC) Presenting with 2 weeks of worsening breathing, cough, fever, and increased O2 requirement iso Influenza A. BNP 470, CXR with vascular congestion. Afebrile with mild leukocytosis 11.7. Levaquin started in ED. - Admit to Med-Surg with FMTS, attending Dr Pollie Meyer - Received 750 Levofloxacin in ED - consider continued abx   - Consider steroids if worsening - Procal ordered  - S/p 60 IV lasix - redose as indicated  - Cont formulary equivalent of home Trelegy (Breo and Incruse Ellipta)  - Oxygen supplementation - goal 88-92% - Strict I/Os - Daily weights  - Vitals per floor - AM BMP, CBC Hyponatremia Na 126 on admission. May be chronic, has recent history of hyponatremia to 125-133.  - CTM  - AM BMP - If not improving, consider obtaining Uosm, Serium Osm, Una. T2DM (type 2 diabetes mellitus) (HCC) CBG stable on admission. Home meds include: Tradjenta 5 daily. Last A1c 6.3 in Sept 2024.  - Continue home Tradjenta - CBGs q4    Chronic and Stable Problems: ***   FEN/GI: *** PPx: *** Dispo:{FPTSDISOLIST:27587} {FPTSDISOTIME:27588}. Barriers include ***.   Subjective:  ***  Objective: Temp:  [98.5 F (36.9 C)-98.7 F (37.1 C)] 98.5 F (36.9 C) (01/24 1800) Pulse Rate:  [60-84] 60 (01/24 2100) Resp:  [14-20] 18 (01/24  2100) BP: (124-169)/(68-142) 147/80 (01/24 2100) SpO2:  [92 %-99 %] 95 % (01/24 2100) Weight:  [138.3 kg] 138.3 kg (01/24 1451) Physical Exam: General: *** Cardiovascular: *** Respiratory: *** Abdomen: *** Extremities: ***  Laboratory: Most recent CBC Lab Results  Component Value Date   WBC 11.7 (H) 02/08/2023   HGB 10.3 (L) 02/08/2023   HCT 31.4 (L) 02/08/2023   MCV 86.3 02/08/2023   PLT 201 02/08/2023   Most recent BMP    Latest Ref Rng & Units 02/08/2023    2:55 PM  BMP  Glucose 70 - 99 mg/dL 147   BUN 8 - 23 mg/dL 41   Creatinine 8.29 - 1.00 mg/dL 5.62   Sodium 130 - 865 mmol/L 126   Potassium 3.5 - 5.1 mmol/L 4.1   Chloride 98 - 111 mmol/L 94   CO2 22 - 32 mmol/L 21   Calcium 8.9 - 10.3 mg/dL 9.6      Alfredo Martinez, MD 02/08/2023, 10:29 PM  PGY-3, [place] [facility] FPTS Intern pager: [phone], text pages welcome Secure chat group [facility] Teaching Service

## 2023-02-08 NOTE — H&P (Cosign Needed Addendum)
Hospital Admission History and Physical Service Pager: [phone]  Patient name: Abigail Wallace Medical record number: [id number] Date of Birth: [birth year] Age: 78 y.o. Gender: female  Primary Care Provider: Westley Chandler, MD Consultants: None Code Status: FULL CODE Preferred Emergency Contact:  Alda Berthold [phone]  [phone]   Chief Complaint: Difficultly breathing   Assessment and Plan: Abigail Wallace is a 78 y.o. female presenting with AHRF. Differential for presentation of this includes:  URI: Has known flu, on tamiflu.  PNA: CXR with possible infiltrates. Reported fever, afebrile on arrival with mild leukocytosis 11.7. COPD: Reported increase in cough and amount of sputum.   ADHF: CXR with fluid, though BNP 470 appears below baseline.   PE: No chest pain. Stable on 2L O2.  ACS: No chest pain. Stable vitals.   Suspect multifactorial etiology including pulmonary edema from CHF (mild peripheral edema, but stable BNP) , influenza A infection, COPD exacerbation (wheezing on auscultation), and possible secondary infection (s/p 1 dose of Levaquin in ED). Additionally, discussed prednisone with son who prefers to avoid starting steroids unless absolutely necessary as they can cause mother to "Not be herself, and stay up all night." Assessment & Plan Acute hypoxic respiratory failure (HCC) Presenting with 2 weeks of worsening breathing, cough, fever, and increased O2 requirement iso Influenza A. BNP 470, CXR with vascular congestion. Afebrile with mild leukocytosis 11.7. Levaquin started in ED. - Admit to Med-Surg with FMTS, attending Dr Pollie Meyer - Received 750 Levofloxacin in ED - consider continued abx   - Consider steroids if worsening - Procal ordered  - S/p 60 IV lasix - redose as indicated  - Cont formulary equivalent of home Trelegy (Breo and Incruse Ellipta)  - Oxygen supplementation - goal 88-92% - Strict I/Os - Daily weights  - Vitals per floor - AM  BMP, CBC Hyponatremia Na 126 on admission. May be chronic, has recent history of hyponatremia to 125-133.  - CTM  - AM BMP - If not improving, consider obtaining Uosm, Serium Osm, Una. T2DM (type 2 diabetes mellitus) (HCC) CBG stable on admission. Home meds include: Tradjenta 5 daily. Last A1c 6.3 in Sept 2024.  - Continue home Tradjenta - CBGs q4  Chronic and Stable Problems:  Afib- Cont home Eliquis 2.5 BID HLD- Cont home Pravastatin 40 daily  CKD- Cont home Sodium bicarb 650 BID HTN- Cont home Amlodipine 10 daily HFrEF- Cont home Metoprolol tartrate 100 TID, hold PO Lasix 40 daily Gout- Cont home Allopurinol 100 daily Schizophrenia - Cont home Depakote 250 BID, Olanzapine 5 nightly  Constipation- Miralax 17 daily prn   FEN/GI: Heart healthy  VTE Prophylaxis: Home Eliquis 2.5 BID  Disposition: Admit to Med-Surg with FMTS, attending Dr Pollie Meyer  History of Present Illness:  Abigail Wallace is a 78 y.o. female presenting with difficulty breathing   Patient had shingles for several weeks. Patient has had flu-like symptoms for 1-2 weeks now. Tested positive for the flu recently, was started on Tamiflu. Patient's son has noted labored breathing for several days. She's usually on RA, but has oxygen supplies at home as needed. Patient had tried 2L at home with sats in low 90s. Per son, patient has been coughing more and producing increased clear sputum. Had a fever of 101 at home.   In the ED, patient requiring 4L O2, BNP 470, CXR with vascular congestion. Afebrile with mild leukocytosis 11.7. Hyponatremic 126. Received breathing treatments and IV Levaquin.   Review Of Systems: Per HPI  Pertinent Past Medical History: Afib on Eliquis Complete heart block s/p pacemaker  HFrED - Echo Feb 2024 with EF 45 to 50%  CKD COPD CAD Gout HTN Schizophrenia Tardive dyskinesia T2DM Remainder reviewed in history tab.   Pertinent Past Surgical History: Cardiac cath 2007 Cardioversion  2023 Pacemaker placement 2007 Implantable EP device 2016 Remainder reviewed in history tab.   Pertinent Social History: Tobacco use: Former smoker, quit feb 2024 (>50 years) Alcohol use: None Other Substance use: None Lives with Son  Pertinent Family History: Mother - heart disease Father - heart disease Sister - stroke Son - T2DM Remainder reviewed in history tab.   Important Outpatient Medications: Tamiflu   Trelegy daily, Albuterol q6 prn, Duoneb q4 prn Allopurinol 100 daily Amlodipine 10 daily Eliquis 2.5 BID Depakote 250 BID Lasix 40 daily Gabapentin 100 TID Miralax TID, Lactulose prn  Metoprolol tartrate 100 TID prn ? Nitroglycerin prn  Olanzapine 5 nightly  Pravastain 40 daily  Sodium bicarb 650 BID Tradjenta 5 daily  Remainder reviewed in medication history.   Objective: BP 124/68   Pulse 84   Temp 98.7 F (37.1 C) (Tympanic)   Resp 14   Ht 5\' 6"  (1.676 m)   Wt (!) 138.3 kg   SpO2 96%   BMI 49.23 kg/m  Exam: General: No acute distress.  CV: Normal S1/S2. No extra heart sounds. Warm and well-perfused. Pulm: On 3L O2. Diffuse wheezes and crackles throughout. Increased WOB, subcostal retractions. Abd: Soft, non-tender, non-distended. Skin:  Warm, dry. Ext: Moderate BLE edema to level of knee, nontender to palpation.   Labs:  CBC BMET  Recent Labs  Lab 02/08/23 1455  WBC 11.7*  HGB 10.3*  HCT 31.4*  PLT 201   Recent Labs  Lab 02/08/23 1455  NA 126*  K 4.1  CL 94*  CO2 21*  BUN 41*  CREATININE 2.11*  GLUCOSE 154*  CALCIUM 9.6    BNP 470 (below baseline) Flu positive   EKG: Ventricular pacing.   Imaging Studies Performed:  CXR: Stable cardiomegaly with pulmonary vascular congestion   Ivery Quale, MD 02/08/2023, 4:50 PM PGY-1, [facility]  FPTS Intern pager: [phone], text pages welcome Secure chat group [facility] Teaching Service   Upper Level Addendum: I have seen and evaluated this  patient along with Dr. Threasa Beards and reviewed the above note, making necessary revisions as appropriate. I agree with the medical decision making and physical exam as noted above. Tiffany Kocher, DO PGY-2 [facility] Family [facility] Residency

## 2023-02-08 NOTE — Assessment & Plan Note (Addendum)
Na 126 on admission. May be chronic, has recent history of hyponatremia to 125-133.  - CTM  - AM BMP - If not improving, consider obtaining Uosm, Serium Osm, Una.

## 2023-02-08 NOTE — Hospital Course (Addendum)
Abigail Wallace is a 78 y.o.female with a history of Afib on Eliquis, complete heart block with pacemaker, heart failure, CKD, COPD, CAD, gout, hypertension, schizophrenia, tardive dyskinesia, type 2 diabetes who was admitted to the [facility] Teaching Service at [facility] for dyspnea and flu-like symptoms. Her hospital course is detailed below:  Influenza A Patient presented to the ED with 2 weeks of worsening breathing as well as cough and fever with known influenza A.  She was admitted to the hospital for observation and treatment.  Patient received a dose of levofloxacin in the ER and also received a dose of IV Lasix.  Was placed on 2L [facility] but transitioned to room air and had gradual improvement with time. Received Tamiflu as well.   Hyponatremia Cautious with fluid administration.  She has had hyponatremia over the past few weeks with a sodium level of 126 on admission.  On discharge her sodium level was***.   Other chronic conditions were medically managed with home medications and formulary alternatives as necessary (Afib, HLD, CKD, HFrEF, Anemia, Gout, schizophrenia, constipation, sleep disturbance)  PCP Follow-up Recommendations:

## 2023-02-09 DIAGNOSIS — J9601 Acute respiratory failure with hypoxia: Secondary | ICD-10-CM

## 2023-02-09 LAB — GLUCOSE, CAPILLARY
Glucose-Capillary: 108 mg/dL — ABNORMAL HIGH (ref 70–99)
Glucose-Capillary: 111 mg/dL — ABNORMAL HIGH (ref 70–99)
Glucose-Capillary: 120 mg/dL — ABNORMAL HIGH (ref 70–99)
Glucose-Capillary: 124 mg/dL — ABNORMAL HIGH (ref 70–99)
Glucose-Capillary: 124 mg/dL — ABNORMAL HIGH (ref 70–99)
Glucose-Capillary: 126 mg/dL — ABNORMAL HIGH (ref 70–99)

## 2023-02-09 LAB — CBC
HCT: 33.1 % — ABNORMAL LOW (ref 36.0–46.0)
Hemoglobin: 11 g/dL — ABNORMAL LOW (ref 12.0–15.0)
MCH: 28.1 pg (ref 26.0–34.0)
MCHC: 33.2 g/dL (ref 30.0–36.0)
MCV: 84.7 fL (ref 80.0–100.0)
Platelets: 206 10*3/uL (ref 150–400)
RBC: 3.91 MIL/uL (ref 3.87–5.11)
RDW: 16.3 % — ABNORMAL HIGH (ref 11.5–15.5)
WBC: 9 10*3/uL (ref 4.0–10.5)
nRBC: 0 % (ref 0.0–0.2)

## 2023-02-09 LAB — BASIC METABOLIC PANEL
Anion gap: 13 (ref 5–15)
BUN: 48 mg/dL — ABNORMAL HIGH (ref 8–23)
CO2: 18 mmol/L — ABNORMAL LOW (ref 22–32)
Calcium: 9.1 mg/dL (ref 8.9–10.3)
Chloride: 98 mmol/L (ref 98–111)
Creatinine, Ser: 2.35 mg/dL — ABNORMAL HIGH (ref 0.44–1.00)
GFR, Estimated: 20 mL/min — ABNORMAL LOW (ref >60)
Glucose, Bld: 129 mg/dL — ABNORMAL HIGH (ref 70–99)
Potassium: 4.6 mmol/L (ref 3.5–5.1)
Sodium: 129 mmol/L — ABNORMAL LOW (ref 135–145)

## 2023-02-09 LAB — PROCALCITONIN: Procalcitonin: 0.34 ng/mL

## 2023-02-09 MED ORDER — FUROSEMIDE 10 MG/ML IJ SOLN
40.0000 mg | Freq: Once | INTRAMUSCULAR | Status: AC
  Administered 2023-02-09: 40 mg via INTRAVENOUS
  Filled 2023-02-09: qty 4

## 2023-02-09 MED ORDER — MELATONIN 5 MG PO TABS
5.0000 mg | ORAL_TABLET | Freq: Every day | ORAL | Status: DC
  Administered 2023-02-10: 5 mg via ORAL
  Filled 2023-02-09 (×3): qty 1

## 2023-02-09 MED ORDER — SCOPOLAMINE 1 MG/3DAYS TD PT72
1.0000 | MEDICATED_PATCH | TRANSDERMAL | Status: DC
  Administered 2023-02-09 – 2023-02-12 (×2): 1.5 mg via TRANSDERMAL
  Filled 2023-02-09 (×2): qty 1

## 2023-02-09 MED ORDER — DOXYCYCLINE HYCLATE 100 MG PO TABS: 100.0000 mg | ORAL_TABLET | Freq: Two times a day (BID) | ORAL | Status: DC

## 2023-02-09 MED ORDER — DOXEPIN HCL 10 MG PO CAPS
10.0000 mg | ORAL_CAPSULE | Freq: Once | ORAL | Status: AC
  Administered 2023-02-10: 10 mg via ORAL
  Filled 2023-02-09: qty 1

## 2023-02-09 MED ORDER — LEVOFLOXACIN IN D5W 750 MG/150ML IV SOLN: 750.0000 mg | Freq: Once | INTRAVENOUS | Status: DC

## 2023-02-09 MED ORDER — SODIUM CHLORIDE 0.9 % IV SOLN: 100.0000 mg | Freq: Two times a day (BID) | INTRAVENOUS | Status: DC

## 2023-02-09 MED ORDER — ORAL CARE MOUTH RINSE: 15.0000 mL | OROMUCOSAL | Status: DC | PRN

## 2023-02-09 MED ORDER — FUROSEMIDE 10 MG/ML IJ SOLN: 40.0000 mg | Freq: Once | INTRAMUSCULAR | Status: DC

## 2023-02-09 MED ORDER — CHLORHEXIDINE GLUCONATE CLOTH 2 % EX PADS
6.0000 | MEDICATED_PAD | Freq: Every day | CUTANEOUS | Status: DC
  Administered 2023-02-09 – 2023-02-14 (×6): 6 via TOPICAL

## 2023-02-09 MED ORDER — SODIUM CHLORIDE 0.9 % IV SOLN
100.0000 mg | Freq: Two times a day (BID) | INTRAVENOUS | Status: DC
  Administered 2023-02-09 – 2023-02-11 (×4): 100 mg via INTRAVENOUS
  Filled 2023-02-09 (×5): qty 100

## 2023-02-09 MED ORDER — LEVOFLOXACIN IN D5W 250 MG/50ML IV SOLN: 250.0000 mg | INTRAVENOUS | Status: DC

## 2023-02-09 NOTE — Progress Notes (Signed)
Pt has refused most care offered this AM. Refused meds stating "You all are trying to OD me.", refused room cleaning d/t the smell, refused labs d/t the techs deodorant smell. Physician aware. Care plan continues.

## 2023-02-09 NOTE — ED Notes (Signed)
ED TO INPATIENT HANDOFF REPORT  ED Nurse Name and Phone #: Melburn Hake Name/Age/Gender [facility] 78 y.o. female Room/Bed: 043C/043C  Code Status   Code Status: Full Code  Home/SNF/Other Home Patient oriented to: situation Is this baseline? Yes   Triage Complete: Triage complete  Chief Complaint Acute hypoxic respiratory failure (HCC) [J96.01]  Triage Note Pt BIB [facility] from home d/t SOB after Dx with the flu 3 days ago. Family on scene reports her PCP said to go into ED if her sats dropped below 90% & upon their arrival she was wearing her baseline 2L O2 via n/c & sating at 95% & wanted to come in for eval d/t still feeling SOB & some dizziness. EMS reports she had rales all over & was given 12L O2 via NRB en route to ED & she was sating at 98%. Pt does have a pacemaker, 136/58, CBG 140, A/Ox4.   Allergies Allergies  Allergen Reactions   Aripiprazole Anaphylaxis, Other (See Comments) and Shortness Of Breath    Tremors   Clozapine Anaphylaxis   Haloperidol Lactate Other (See Comments)    Severe tardive dyskinesias, somnolence, speech change    Benadryl [Diphenhydramine Hcl] Other (See Comments)    States affects her vision   Benztropine Other (See Comments)    Affects mobility   Latuda [Lurasidone Hcl] Other (See Comments)    "Tremors and shakes."    Lorazepam Other (See Comments)    Unknown reaction??   Remeron [Mirtazapine] Other (See Comments)    Tremors and shakes   Keflex [Cephalexin] Swelling and Other (See Comments)    Per patient, has tongue swelling with keflex but can tolerate amoxicillin   Codeine Other (See Comments)    Unknown reaction??   Latex Rash    Level of Care/Admitting Diagnosis ED Disposition     ED Disposition  Admit   Condition  --   Comment  Hospital Area: [facility] [[id number]]  Level of Care: Med-Surg [16]  May admit patient to [facility] or [facility] if equivalent level of care is available:: No  Covid  Evaluation: Confirmed COVID Negative  Diagnosis: Acute hypoxic respiratory failure [facility]) [[id number]]  Admitting Physician: Ivery Quale [[id number]]  Attending Physician: Latrelle Dodrill [phone]  Certification:: I certify this patient will need inpatient services for at least 2 midnights  Expected Medical Readiness: 02/11/2023          B Medical/Surgery History Past Medical History:  Diagnosis Date   Abdominal wall hernia 01/2017   Advanced care planning/counseling discussion 09/29/2021   Atrial fibrillation (HCC)    CHF (congestive heart failure) (HCC)    CKD (chronic kidney disease)    COPD (chronic obstructive pulmonary disease) (HCC)    Coronary artery disease    NON-CRITICAL   Ectasis aorta (HCC)    GERD (gastroesophageal reflux disease)    Gout    Hypertension    Memory difficulties 12/02/2013   Pacemaker    Psychosis (HCC)    Tardive dyskinesia 04/26/2011   Type 2 diabetes mellitus (HCC)    Past Surgical History:  Procedure Laterality Date   CARDIAC CATHETERIZATION  2007   Non-critical.    CARDIOVERSION N/A 01/27/2021   Procedure: CARDIOVERSION;  Surgeon: Sande Rives, MD;  Location: [facility] ENDOSCOPY;  Service: Cardiovascular;  Laterality: N/A;   CHOLECYSTECTOMY     ELBOW SURGERY     EP IMPLANTABLE DEVICE N/A 08/31/2014   Procedure: PPM Generator Changeout;  Surgeon: Thurmon Fair, MD;  Location: [facility] INVASIVE CV LAB;  Service: Cardiovascular;  Laterality: N/A;   INSERT / REPLACE / REMOVE PACEMAKER  2007   Tangent/SYMPTOMATIC HEART BLOCK   PACEMAKER PLACEMENT  09/28/2005   2/2 SSS?   Persantine stress test  05/01/2010   EF 66%. Normal LV sys fx. Unchanged from previous studies.    TRANSTHORACIC ECHOCARDIOGRAM  09/08/10   SEVERE CONCENTRIC HYPERTROPHY.LV FUNCTION WAS VIGOROUS.EF 65%-70%.VENTRICULAR SEPTUM-INCOORDINATE MOTION.LEFT ATRIUM-MILDLY DILATED.TRIVIAL TR.   TUBAL LIGATION       A IV Location/Drains/Wounds Patient Lines/Drains/Airways Status      Active Line/Drains/Airways     Name Placement date Placement time Site Days   Peripheral IV 02/08/23 20 G Left Antecubital 02/08/23  1555  Antecubital  1   Urethral Catheter Alana, RN Latex 16 Fr. 01/17/23  1657  Latex  23            Intake/Output Last 24 hours  Intake/Output Summary (Last 24 hours) at 02/09/2023 0020 Last data filed at 02/08/2023 1808 Gross per 24 hour  Intake 150 ml  Output --  Net 150 ml    Labs/Imaging Results for orders placed or performed during the hospital encounter of 02/08/23 (from the past 48 hours)  Resp panel by RT-PCR (RSV, Flu A&B, Covid) Anterior Nasal Swab     Status: Abnormal   Collection Time: 02/08/23  2:51 PM   Specimen: Anterior Nasal Swab  Result Value Ref Range   SARS Coronavirus 2 by RT PCR NEGATIVE NEGATIVE   Influenza A by PCR POSITIVE (A) NEGATIVE   Influenza B by PCR NEGATIVE NEGATIVE    Comment: (NOTE) The Xpert Xpress SARS-CoV-2/FLU/RSV plus assay is intended as an aid in the diagnosis of influenza from Nasopharyngeal swab specimens and should not be used as a sole basis for treatment. Nasal washings and aspirates are unacceptable for Xpert Xpress SARS-CoV-2/FLU/RSV testing.  Fact Sheet for Patients: BloggerCourse.com  Fact Sheet for Healthcare Providers: SeriousBroker.it  This test is not yet approved or cleared by the [place] FDA and has been authorized for detection and/or diagnosis of SARS-CoV-2 by FDA under an Emergency Use Authorization (EUA). This EUA will remain in effect (meaning this test can be used) for the duration of the COVID-19 declaration under Section 564(b)(1) of the Act, 21 U.S.C. section 360bbb-3(b)(1), unless the authorization is terminated or revoked.     Resp Syncytial Virus by PCR NEGATIVE NEGATIVE    Comment: (NOTE) Fact Sheet for Patients: BloggerCourse.com  Fact Sheet for Healthcare  Providers: SeriousBroker.it  This test is not yet approved or cleared by the [place] FDA and has been authorized for detection and/or diagnosis of SARS-CoV-2 by FDA under an Emergency Use Authorization (EUA). This EUA will remain in effect (meaning this test can be used) for the duration of the COVID-19 declaration under Section 564(b)(1) of the Act, 21 U.S.C. section 360bbb-3(b)(1), unless the authorization is terminated or revoked.  Performed at [facility], [address] [place], [place] [address]   CBC     Status: Abnormal   Collection Time: 02/08/23  2:55 PM  Result Value Ref Range   WBC 11.7 (H) 4.0 - 10.5 K/uL   RBC 3.64 (L) 3.87 - 5.11 MIL/uL   Hemoglobin 10.3 (L) 12.0 - 15.0 g/dL   HCT 91.4 (L) 78.2 - 95.6 %   MCV 86.3 80.0 - 100.0 fL   MCH 28.3 26.0 - 34.0 pg   MCHC 32.8 30.0 - 36.0 g/dL   RDW 21.3 (H) 08.6 -  15.5 %   Platelets 201 150 - 400 K/uL   nRBC 0.0 0.0 - 0.2 %    Comment: Performed at [facility], [address] [place], [place] [address]  Comprehensive metabolic panel     Status: Abnormal   Collection Time: 02/08/23  2:55 PM  Result Value Ref Range   Sodium 126 (L) 135 - 145 mmol/L   Potassium 4.1 3.5 - 5.1 mmol/L   Chloride 94 (L) 98 - 111 mmol/L   CO2 21 (L) 22 - 32 mmol/L   Glucose, Bld 154 (H) 70 - 99 mg/dL    Comment: Glucose reference range applies only to samples taken after fasting for at least 8 hours.   BUN 41 (H) 8 - 23 mg/dL   Creatinine, Ser 6.04 (H) 0.44 - 1.00 mg/dL   Calcium 9.6 8.9 - 54.0 mg/dL   Total Protein 6.8 6.5 - 8.1 g/dL   Albumin 3.2 (L) 3.5 - 5.0 g/dL   AST 16 15 - 41 U/L   ALT 14 0 - 44 U/L   Alkaline Phosphatase 47 38 - 126 U/L   Total Bilirubin 0.3 0.0 - 1.2 mg/dL   GFR, Estimated 23 (L) >60 mL/min    Comment: (NOTE) Calculated using the CKD-EPI Creatinine Equation (2021)    Anion gap 11 5 - 15    Comment: Performed at [facility], [address]  [place], [place] [address]  Brain natriuretic peptide     Status: Abnormal   Collection Time: 02/08/23  2:55 PM  Result Value Ref Range   B Natriuretic Peptide 470.1 (H) 0.0 - 100.0 pg/mL    Comment: Performed at [facility], [address], [place], [place] [address]   *Note: Due to a large number of results and/or encounters for the requested time period, some results have not been displayed. A complete set of results can be found in Results Review.   DG Chest Port 1 View Result Date: 02/08/2023 CLINICAL DATA:  Cough. EXAM: PORTABLE CHEST 1 VIEW COMPARISON:  10/12/2022. FINDINGS: Stable cardiomegaly. Stable left-sided pacemaker in place. Aortic atherosclerosis. Pulmonary vascular congestion with diffuse bilateral interstitial opacities, most pronounced at the lung bases. No acute osseous abnormality. IMPRESSION: Stable cardiomegaly with pulmonary vascular congestion and findings suggestive of interstitial edema. Electronically Signed   By: Hart Robinsons M.D.   On: 02/08/2023 15:56    Pending Labs Unresulted Labs (From admission, onward)     Start     Ordered   02/09/23 0500  Basic metabolic panel  Tomorrow morning,   R        02/08/23 1831   02/09/23 0500  CBC  Tomorrow morning,   R        02/08/23 1831   02/08/23 1845  Procalcitonin  Once,   R       References:    Procalcitonin Lower Respiratory Tract Infection AND Sepsis Procalcitonin Algorithm   02/08/23 1844            Vitals/Pain Today's Vitals   02/08/23 1800 02/08/23 1830 02/08/23 2100 02/08/23 2200  BP: (!) 169/142 (!) 147/80 (!) 147/80 102/87  Pulse: 70  60 66  Resp: 20  18 18   Temp: 98.5 F (36.9 C)   98.2 F (36.8 C)  TempSrc: Oral     SpO2: 99%  95% 100%  Weight:      Height:        Isolation Precautions No active isolations  Medications Medications  allopurinol (ZYLOPRIM) tablet 100 mg (has no administration in time range)  amLODipine (NORVASC) tablet 10 mg (has no administration in time range)   metoprolol tartrate (LOPRESSOR) tablet 100 mg (100 mg Oral Given 02/08/23 2157)  pravastatin (PRAVACHOL) tablet 40 mg (40 mg Oral Given 02/08/23 2020)  OLANZapine (ZYPREXA) tablet 7.5 mg (7.5 mg Oral Given 02/08/23 2157)  linagliptin (TRADJENTA) tablet 5 mg (has no administration in time range)  sodium bicarbonate tablet 650 mg (650 mg Oral Given 02/08/23 2157)  apixaban (ELIQUIS) tablet 2.5 mg (2.5 mg Oral Given 02/08/23 2157)  divalproex (DEPAKOTE) DR tablet 250 mg (250 mg Oral Given 02/08/23 2157)  gabapentin (NEURONTIN) capsule 100 mg (100 mg Oral Given 02/08/23 2157)  cholecalciferol (VITAMIN D3) 25 MCG (1000 UNIT) tablet 1,000 Units (has no administration in time range)  fluticasone furoate-vilanterol (BREO ELLIPTA) 100-25 MCG/ACT 1 puff (has no administration in time range)    And  umeclidinium bromide (INCRUSE ELLIPTA) 62.5 MCG/ACT 1 puff (1 puff Inhalation Not Given 02/08/23 2115)  ipratropium-albuterol (DUONEB) 0.5-2.5 (3) MG/3ML nebulizer solution 3 mL (has no administration in time range)  acetaminophen (TYLENOL) tablet 1,000 mg (1,000 mg Oral Given 02/08/23 2020)  oseltamivir (TAMIFLU) capsule 30 mg (has no administration in time range)  albuterol (PROVENTIL) (2.5 MG/3ML) 0.083% nebulizer solution 5 mg (5 mg Nebulization Given 02/08/23 1548)  ipratropium (ATROVENT) nebulizer solution 0.5 mg (0.5 mg Nebulization Given 02/08/23 1548)  albuterol (PROVENTIL) (2.5 MG/3ML) 0.083% nebulizer solution (  Given by Other 02/08/23 1842)  levofloxacin (LEVAQUIN) IVPB 750 mg (0 mg Intravenous Stopped 02/08/23 1808)  furosemide (LASIX) injection 40 mg (40 mg Intravenous Given 02/08/23 2020)    Mobility non-ambulatory     Focused Assessments Pulmonary Assessment Handoff:  Lung sounds:          R Recommendations: See Admitting Provider Note  Report given to:   Additional Notes: N/A

## 2023-02-09 NOTE — Evaluation (Signed)
Occupational Therapy Evaluation Patient Details Name: Abigail Wallace MRN: [id number] DOB: 05-09-1945 Today's Date: 02/09/2023   History of Present Illness Pt is a 78 yr old female who presented due to respiratory distress. Pt is + influenza A.  Pt admitted due concerns of PNA.  PMH: COPD on 2L via , CHF, mild dementia, schizophrenia, urinary rentention with chronic foley cath in place, DM, pacemaker   Clinical Impression   Pt at this time presented in bed with her son present throughout the session. He reported since last year when she was present in the hospital has been bed level as fearful of falling and is willing to try anything. She was able to complete supine to sitting and sitting to supine with max x2 but then was unable to place BLE to the floor as then became fearful but agreed to sit at EOB for a period of time with min guard. Pt then attempted lateral scooting x3 total and then max x2 to go from sitting to supine. Pt's son is agreeable to attempt [facility] but will need medical transport home.       If plan is discharge home, recommend the following: A lot of help with bathing/dressing/bathroom;Assistance with cooking/housework;Assistance with feeding;Direct supervision/assist for medications management;Direct supervision/assist for financial management;Assist for transportation;Help with stairs or ramp for entrance;Supervision due to cognitive status    Functional Status Assessment  Patient has had a recent decline in their functional status and demonstrates the ability to make significant improvements in function in a reasonable and predictable amount of time.  Equipment Recommendations  None recommended by OT    Recommendations for Other Services       Precautions / Restrictions Precautions Precautions: Fall Restrictions Weight Bearing Restrictions Per Provider Order: No      Mobility Bed Mobility Overal bed mobility: Needs Assistance Bed Mobility: Rolling, Supine to  Sit, Sit to Supine Rolling: Max assist, +2 for physical assistance, +2 for safety/equipment   Supine to sit: Max assist, +2 for physical assistance, +2 for safety/equipment, HOB elevated Sit to supine: Max assist, +2 for physical assistance, +2 for safety/equipment, HOB elevated, Used rails        Transfers                   General transfer comment: NT      Balance Overall balance assessment: Needs assistance Sitting-balance support: Feet supported, Bilateral upper extremity supported Sitting balance-Leahy Scale: Fair Sitting balance - Comments: Pt at this time was able to sit unsupported for period of 2-4 mins but when attempting to readjust then required max assist to find midline                                   ADL either performed or assessed with clinical judgement   ADL Overall ADL's : Needs assistance/impaired Eating/Feeding: Set up;Sitting;Minimal assistance Eating/Feeding Details (indicate cue type and reason): set in hand to attempt     Upper Body Bathing: Maximal assistance;Bed level   Lower Body Bathing: Total assistance;+2 for safety/equipment;Bed level;+2 for physical assistance   Upper Body Dressing : Maximal assistance;Bed level   Lower Body Dressing: Total assistance;+2 for physical assistance;+2 for safety/equipment;Bed level       Toileting- Clothing Manipulation and Hygiene: Total assistance;+2 for physical assistance;+2 for safety/equipment;Bed level               Vision Baseline Vision/History: 0 No visual deficits  Ability to See in Adequate Light: 0 Adequate Patient Visual Report: No change from baseline Vision Assessment?: No apparent visual deficits     Perception         Praxis         Pertinent Vitals/Pain Pain Assessment Pain Assessment: No/denies pain     Extremity/Trunk Assessment Upper Extremity Assessment Upper Extremity Assessment: Generalized weakness   Lower Extremity Assessment Lower  Extremity Assessment: Defer to PT evaluation   Cervical / Trunk Assessment Cervical / Trunk Assessment: Kyphotic   Communication Communication Communication: No apparent difficulties Cueing Techniques: Verbal cues   Cognition Arousal: Alert Behavior During Therapy: Anxious Overall Cognitive Status: Within Functional Limits for tasks assessed                                       General Comments       Exercises     Shoulder Instructions      Home Living Family/patient expects to be discharged to:: Private residence Living Arrangements: Children Available Help at Discharge: Family;Available PRN/intermittently Type of Home: Apartment Home Access: Level entry     Home Layout: One level     Bathroom Shower/Tub: Chief Strategy Officer: Standard Bathroom Accessibility: Yes   Home Equipment: Shower seat;BSC/3in1;Tub bench;Rollator (4 wheels);Hospital bed   Additional Comments: Pt's son bought a hoyer lift to attempt to transfer out of pocket.      Prior Functioning/Environment Prior Level of Function :  (Pt family reports 2022-used a rolling walker and it broke. Pt then got a new one and then became wc level. Rayetta Pigg of 2024 went into ed and after dc they attempted therapy on/off but would refuse to complete.)             Mobility Comments: Pt has been bed level ADLs Comments: Pt's son attempted to get out of room once a week and washes hair but unclear how he completed this as not getting out of bed.        OT Problem List: Decreased strength;Decreased activity tolerance;Impaired balance (sitting and/or standing);Decreased safety awareness;Decreased knowledge of use of DME or AE;Cardiopulmonary status limiting activity;Impaired UE functional use      OT Treatment/Interventions: Self-care/ADL training;Therapeutic exercise;DME and/or AE instruction;Therapeutic activities;Patient/family education;Balance training    OT Goals(Current  goals can be found in the care plan section) Acute Rehab OT Goals Patient Stated Goal: to have pt complete any activities OT Goal Formulation: With family Time For Goal Achievement: 02/23/23 Potential to Achieve Goals: Good  OT Frequency: Min 1X/week    Co-evaluation PT/OT/SLP Co-Evaluation/Treatment: Yes Reason for Co-Treatment: Complexity of the patient's impairments (multi-system involvement);For patient/therapist safety;To address functional/ADL transfers   OT goals addressed during session: ADL's and self-care      AM-PAC OT "6 Clicks" Daily Activity     Outcome Measure Help from another person eating meals?: A Little Help from another person taking care of personal grooming?: A Lot Help from another person toileting, which includes using toliet, bedpan, or urinal?: Total Help from another person bathing (including washing, rinsing, drying)?: Total Help from another person to put on and taking off regular upper body clothing?: Total Help from another person to put on and taking off regular lower body clothing?: Total 6 Click Score: 9   End of Session Nurse Communication: Mobility status  Activity Tolerance: Patient limited by fatigue Patient left: in bed;with call bell/phone within reach;with  bed alarm set;with nursing/sitter in room  OT Visit Diagnosis: Muscle weakness (generalized) (M62.81);Cognitive communication deficit (R41.841);History of falling (Z91.81);Other abnormalities of gait and mobility (R26.89);Unsteadiness on feet (R26.81)                Time: 6644-0347 OT Time Calculation (min): 35 min Charges:  OT General Charges $OT Visit: 1 Visit OT Evaluation $OT Eval Moderate Complexity: 1 Mod  Presley Raddle OTR/L  Acute Rehab Services  [phone] office number   Alphia Moh 02/09/2023, 12:59 PM

## 2023-02-09 NOTE — ED Notes (Signed)
HIPAA compliant message left on VM for son [name] to advise him that his mother is being moved upstairs at this time.

## 2023-02-09 NOTE — Plan of Care (Signed)
FMTS Interim Progress Note  S: Went to see patient at bedside with Dr. Laroy Apple. Son is at bedside and all questions were answered. Patient seems to be doing alright, she has been having some frothy spit up and having difficulty sleeping.   O: BP (!) 126/52 (BP Location: Right Arm)   Pulse 69   Temp 98.4 F (36.9 C) (Oral)   Resp 20   Ht 5\' 6"  (1.676 m)   Wt (!) 138.3 kg   SpO2 97%   BMI 49.23 kg/m   General: Awake in NAD HEENT: NCAT. Sclera anicteric. No rhinorrhea. Respiratory: Mild inc WOB on 2L [place].  A/P: Acute hypoxic respiratory failure Increased O2 requirement in setting of Influenza A with some concern for PNA as well.  - Antibiotics switched from Levaquin to Doxycycline d/t QTc prolongation noted on XR - Continue Tamiflu 30 mg daily  - Scopolamine patch for nausea - Incruse Ellipta 1 puff daily and Breo Ellipta 1 puff daily - DuoNeb q4h PRN  Rest of plan per progress note.  Fortunato Curling, DO 02/09/2023, 4:43 PM PGY-1, [facility] Family Medicine Service pager [phone]

## 2023-02-09 NOTE — Progress Notes (Signed)
RT called by RN due to desaturation and increase of frothy thin secretions.  Pt given bronchodilator for bilateral wheezing. Pt denies SOB on 2L [place] and is VSS at this time.

## 2023-02-09 NOTE — Evaluation (Signed)
Physical Therapy Evaluation Patient Details Name: Abigail Wallace MRN: [id number] DOB: [birth year] Today's Date: 02/09/2023  History of Present Illness  Pt is a 78 yr old female who presented due to respiratory distress. Pt is + influenza A.  Pt admitted due concerns of PNA.  PMH: COPD on 2L via [place], CHF, mild dementia, schizophrenia, urinary rentention with chronic foley cath in place, DM, pacemaker   Clinical Impression  Pt in bed upon arrival with son present and agreeable to PT eval. Son reports that prior to admit, pt was non-ambulatory and would stay in the bed. Son would assist with all mobility with use of slide sheets. In today's session, pt required MaxAx2 for sup/sit transfers. Pt's balance fluctuated between MaxA and CGA for safety with B UE support. Pt was able to sit for ~4 minutes before requesting to lay down. Pt required totalAx3 with use of bed pad to laterally scoot towards HOB. Pt would benefit from acute skilled PT to provide education to son on positioning and safe mobility. Son is very involved and wants as much education as possible regarding preserving mobility.  Recommending post-acute HHPT to decrease caregiver burden. Acute PT to follow.          If plan is discharge home, recommend the following: A lot of help with walking and/or transfers;A lot of help with bathing/dressing/bathroom;Assistance with cooking/housework;Direct supervision/assist for medications management;Direct supervision/assist for financial management;Assist for transportation;Help with stairs or ramp for entrance   Can travel by private vehicle    No    Equipment Recommendations None recommended by PT (pt owns equipment)     Functional Status Assessment Patient has had a recent decline in their functional status and demonstrates the ability to make significant improvements in function in a reasonable and predictable amount of time.     Precautions / Restrictions Precautions Precautions:  Fall Precaution Comments: on 2L at baseline Restrictions Weight Bearing Restrictions Per Provider Order: No      Mobility  Bed Mobility Overal bed mobility: Needs Assistance Bed Mobility: Rolling, Supine to Sit, Sit to Supine Rolling: Max assist, +2 for physical assistance, +2 for safety/equipment   Supine to sit: Max assist, +2 for physical assistance, +2 for safety/equipment, HOB elevated Sit to supine: Max assist, +2 for physical assistance, +2 for safety/equipment, HOB elevated, Used rails   General bed mobility comments: pt able to minimally assist for sup/sit transfer by moving LE's towards EOB, MaxAx2 for trunk elevation and to complete moving LE's off EOB. TotalAx3 with use of bed pad to laterally scoot pt. Pt able to lean onto elbow with MaxAx2 to complete sit/sup transfer    Transfers    General transfer comment: NT      Balance Overall balance assessment: Needs assistance Sitting-balance support: Feet supported, Bilateral upper extremity supported Sitting balance-Leahy Scale: Poor Sitting balance - Comments: Pt at this time was able to sit unsupported for period of 2-4 mins but when attempting to readjust then required max assist to find midline Postural control: Posterior lean       Pertinent Vitals/Pain Pain Assessment Pain Assessment: No/denies pain    Home Living Family/patient expects to be discharged to:: Private residence Living Arrangements: Children Available Help at Discharge: Family;Available PRN/intermittently Type of Home: Apartment Home Access: Level entry       Home Layout: One level Home Equipment: Shower seat;BSC/3in1;Tub bench;Rollator (4 wheels);Hospital bed Additional Comments: Pt's son bought a hoyer lift to attempt to transfer out of pocket.    Prior  Function Prior Level of Function : Needs assist (Pt family reports 2022-used a rolling walker and it broke. Pt then got a new one and then became wc level. Rayetta Pigg of 2024 went into ED  and after dc they attempted 5 different HH therapy on/off with pt not willing to participate)      Mobility Comments: Pt has been bed level ADLs Comments: Pt's son attempted to get out of room once a week and washes hair but unclear how he completed this as not getting out of bed.     Extremity/Trunk Assessment   Upper Extremity Assessment Upper Extremity Assessment: Defer to OT evaluation    Lower Extremity Assessment Lower Extremity Assessment: Generalized weakness    Cervical / Trunk Assessment Cervical / Trunk Assessment: Kyphotic  Communication   Communication Communication: No apparent difficulties Cueing Techniques: Verbal cues  Cognition Arousal: Alert Behavior During Therapy: Anxious Overall Cognitive Status: History of cognitive impairments - at baseline    General Comments: pt oriented to self and able to remember son's name, follows single step commands with increased time        General Comments General comments (skin integrity, edema, etc.): SpO2 >93% on 2L, son present throughout session and very supportive. Son interested in any education and tips with how to improve pt's mobility     PT Assessment Patient needs continued PT services  PT Problem List Decreased strength;Decreased range of motion;Decreased activity tolerance;Decreased balance;Decreased mobility;Decreased knowledge of use of DME;Decreased safety awareness;Obesity       PT Treatment Interventions DME instruction;Gait training;Functional mobility training;Therapeutic exercise;Therapeutic activities;Balance training;Neuromuscular re-education;Patient/family education;Wheelchair mobility training    PT Goals (Current goals can be found in the Care Plan section)  Acute Rehab PT Goals Patient Stated Goal: to go home PT Goal Formulation: With patient/family Time For Goal Achievement: 02/23/23 Potential to Achieve Goals: Fair    Frequency Min 1X/week     Co-evaluation   Reason for  Co-Treatment: Complexity of the patient's impairments (multi-system involvement);For patient/therapist safety;To address functional/ADL transfers   OT goals addressed during session: ADL's and self-care       AM-PAC PT "6 Clicks" Mobility  Outcome Measure Help needed turning from your back to your side while in a flat bed without using bedrails?: Total Help needed moving from lying on your back to sitting on the side of a flat bed without using bedrails?: Total Help needed moving to and from a bed to a chair (including a wheelchair)?: Total Help needed standing up from a chair using your arms (e.g., wheelchair or bedside chair)?: Total Help needed to walk in hospital room?: Total Help needed climbing 3-5 steps with a railing? : Total 6 Click Score: 6    End of Session   Activity Tolerance: Patient tolerated treatment well Patient left: in bed;with call bell/phone within reach;with bed alarm set;with nursing/sitter in room;with family/visitor present Nurse Communication: Mobility status PT Visit Diagnosis: Other abnormalities of gait and mobility (R26.89);Muscle weakness (generalized) (M62.81)    Time: 1610-9604 PT Time Calculation (min) (ACUTE ONLY): 35 min   Charges:   PT Evaluation $PT Eval Moderate Complexity: 1 Mod   PT General Charges $$ ACUTE PT VISIT: 1 Visit         Hilton Cork, PT, DPT Secure Chat Preferred  Rehab Office [phone]   Arturo Morton Brion Aliment 02/09/2023, 1:50 PM

## 2023-02-10 DIAGNOSIS — J9601 Acute respiratory failure with hypoxia: Secondary | ICD-10-CM | POA: Diagnosis not present

## 2023-02-10 LAB — CBC
HCT: 31.4 % — ABNORMAL LOW (ref 36.0–46.0)
Hemoglobin: 10.4 g/dL — ABNORMAL LOW (ref 12.0–15.0)
MCH: 28 pg (ref 26.0–34.0)
MCHC: 33.1 g/dL (ref 30.0–36.0)
MCV: 84.6 fL (ref 80.0–100.0)
Platelets: 203 10*3/uL (ref 150–400)
RBC: 3.71 MIL/uL — ABNORMAL LOW (ref 3.87–5.11)
RDW: 16.1 % — ABNORMAL HIGH (ref 11.5–15.5)
WBC: 8.3 10*3/uL (ref 4.0–10.5)
nRBC: 0 % (ref 0.0–0.2)

## 2023-02-10 LAB — GLUCOSE, CAPILLARY
Glucose-Capillary: 118 mg/dL — ABNORMAL HIGH (ref 70–99)
Glucose-Capillary: 114 mg/dL — ABNORMAL HIGH (ref 70–99)
Glucose-Capillary: 141 mg/dL — ABNORMAL HIGH (ref 70–99)
Glucose-Capillary: 123 mg/dL — ABNORMAL HIGH (ref 70–99)
Glucose-Capillary: 160 mg/dL — ABNORMAL HIGH (ref 70–99)

## 2023-02-10 LAB — BASIC METABOLIC PANEL
Anion gap: 13 (ref 5–15)
BUN: 51 mg/dL — ABNORMAL HIGH (ref 8–23)
CO2: 19 mmol/L — ABNORMAL LOW (ref 22–32)
Calcium: 8.8 mg/dL — ABNORMAL LOW (ref 8.9–10.3)
Chloride: 98 mmol/L (ref 98–111)
Creatinine, Ser: 2.38 mg/dL — ABNORMAL HIGH (ref 0.44–1.00)
GFR, Estimated: 20 mL/min — ABNORMAL LOW (ref >60)
Glucose, Bld: 148 mg/dL — ABNORMAL HIGH (ref 70–99)
Potassium: 3.6 mmol/L (ref 3.5–5.1)
Sodium: 130 mmol/L — ABNORMAL LOW (ref 135–145)

## 2023-02-10 MED ORDER — DICLOFENAC SODIUM 1 % EX GEL
4.0000 g | Freq: Three times a day (TID) | CUTANEOUS | Status: DC
  Administered 2023-02-10 – 2023-02-14 (×13): 4 g via TOPICAL
  Filled 2023-02-10: qty 100

## 2023-02-10 MED ORDER — FUROSEMIDE 40 MG PO TABS
40.0000 mg | ORAL_TABLET | Freq: Every day | ORAL | Status: DC
  Administered 2023-02-10 – 2023-02-11 (×2): 40 mg via ORAL
  Filled 2023-02-10 (×2): qty 1

## 2023-02-10 MED ORDER — ONDANSETRON 4 MG PO TBDP
4.0000 mg | ORAL_TABLET | Freq: Once | ORAL | Status: AC
  Administered 2023-02-10: 4 mg via ORAL
  Filled 2023-02-10: qty 1

## 2023-02-10 MED ORDER — DOXEPIN HCL 10 MG PO CAPS
10.0000 mg | ORAL_CAPSULE | Freq: Once | ORAL | Status: AC
  Administered 2023-02-10: 10 mg via ORAL
  Filled 2023-02-10: qty 1

## 2023-02-10 NOTE — Assessment & Plan Note (Addendum)
CBGs remain stable. Home meds include: Tradjenta 5 daily. Last A1c 6.3 in Sept 2024.  - Continue home Tradjenta - CBGs q4

## 2023-02-10 NOTE — Assessment & Plan Note (Addendum)
Known Influenza A, history of COPD and HFrEF. Concern for possibility of PNA on XR. Procal 0.34. Cr uptrending since admission 2.11 > 2.38 - will transition from IV lasix back down to home PO lasix. Overall, no significant extremity swelling, with some diffuse crackles. Satting well on 2L O2, which she occasionally uses at home (twice a year per son). Patient remains afebrile without leukocytosis.  - Continue Doxycycline 100 BID - Continue tamiflu 30 daily (last dose 1/29) - Consider steroids if worsening - S/p IV lasix - transition to home PO lasix 40 daily - Cont formulary equivalent of home Trelegy (Breo and Incruse Ellipta)  - Duonebs q4 prn  - Oxygen supplementation - goal 88-92% - Strict I/Os - Daily weights  - AM BMP, CBC

## 2023-02-10 NOTE — Plan of Care (Signed)
  Problem: Clinical Measurements: Goal: Respiratory complications will improve Outcome: Progressing   Problem: Nutrition: Goal: Adequate nutrition will be maintained Outcome: Progressing   Problem: Coping: Goal: Level of anxiety will decrease Outcome: Progressing   Problem: Pain Managment: Goal: General experience of comfort will improve and/or be controlled Outcome: Progressing   Problem: Safety: Goal: Ability to remain free from injury will improve Outcome: Progressing   Problem: Skin Integrity: Goal: Risk for impaired skin integrity will decrease Outcome: Progressing   Problem: Activity: Goal: Risk for activity intolerance will decrease Outcome: Not Progressing   Problem: Elimination: Goal: Will not experience complications related to urinary retention Outcome: Not Progressing

## 2023-02-10 NOTE — Assessment & Plan Note (Addendum)
Na stable at 129> 130 this morning. May be chronic, has recent history of hyponatremia to 125-133.  - CTM  - AM BMP - May consider Uosm, Serium Osm, Neomia Dear

## 2023-02-10 NOTE — Progress Notes (Signed)
Daily Progress Note Intern Pager: [phone]  Patient name: Abigail Wallace Medical record number: [id number] Date of birth: February 01, 1945 Age: 78 y.o. Gender: female  Primary Care Provider: Westley Chandler, MD Consultants: None  Code Status: FULL    Pt Overview and Major Events to Date:  02/08/23: Admit to FMTS    Assessment and Plan:   Idy Rawling is a 78 y.o. female presenting with acute respiratory distress with PMH/PSH includes COPD on 2 L nasal cannula as needed, hypertension, diabetes, CHF, mild dementia, schizophrenia, urinary retention with chronic foley catheter in place. Admitted for new O2 requirement iso influenza and possible pneumonia on XR. Patient has been largely stable on home O2. Assessment & Plan Acute hypoxic respiratory failure (HCC) Known Influenza A, history of COPD and HFrEF. Concern for possibility of PNA on XR. Procal 0.34. Cr uptrending since admission 2.11 > 2.38 - will transition from IV lasix back down to home PO lasix. Overall, no significant extremity swelling, with some diffuse crackles. Satting well on 2L O2, which she occasionally uses at home (twice a year per son). Patient remains afebrile without leukocytosis.  - Continue Doxycycline 100 BID - Continue tamiflu 30 daily (last dose 1/29) - Consider steroids if worsening - S/p IV lasix - transition to home PO lasix 40 daily - Cont formulary equivalent of home Trelegy (Breo and Incruse Ellipta)  - Duonebs q4 prn  - Oxygen supplementation - goal 88-92% - Strict I/Os - Daily weights  - AM BMP, CBC Hyponatremia Na stable at 129> 130 this morning. May be chronic, has recent history of hyponatremia to 125-133.  - CTM  - AM BMP - May consider Uosm, Serium Osm, Una T2DM (type 2 diabetes mellitus) (HCC) CBGs remain stable. Home meds include: Tradjenta 5 daily. Last A1c 6.3 in Sept 2024.  - Continue home Tradjenta - CBGs q4   Chronic and Stable Problems:  Afib- Cont home Eliquis 2.5 BID HLD-  Cont home Pravastatin 40 daily  CKD- Cont home Sodium bicarb 650 BID HTN- Cont home Amlodipine 10 daily HFrEF- Cont home Metoprolol tartrate 100 TID, redosing lasix as indicated, has home dose  Anemia - chronic, stable around baseline (9-10) - CTM Gout- Cont home Allopurinol 100 daily Schizophrenia - Cont home Depakote 250 BID, Olanzapine 5 nightly  Constipation- Miralax 17 daily prn  Sleep disturbance- Son reports melatonin and doxepin do not help, will CTM      FEN/GI: Heart Healthy PPx: Eliquis Dispo: Home pending continued clinical stability   Subjective:  Per patient, doing fine this morning. Slept a couple of hours. Has some sternal chest discomfort that is worsened by coughing. She would like to be able to sit up on the side of her bed today.   Per patient's son, patient has not been able to sleep and continues to have increased effort with breathing. Melatonin is not helpful, doxepin did not seem to help either. She has been producing "bubbly spittle" that is new for her. He is worried about her O2 requirement.   Objective: Temp:  [97.7 F (36.5 C)-98.7 F (37.1 C)] 98.7 F (37.1 C) (01/26 0426) Pulse Rate:  [61-72] 61 (01/26 0426) Resp:  [16-20] 20 (01/26 0426) BP: (92-157)/(52-65) 92/54 (01/26 0426) SpO2:  [95 %-98 %] 96 % (01/26 0426) Weight:  [130 kg] 139 kg (01/26 0500) Physical Exam: General: No acute distress.  CV: Has pacemaker. No murmurs or extra heart sounds. Warm and well-perfused. Pulm: On 2L O2. Diffuse bilateral  crackles and wheezes. Some abdominal breathing.  Abd: Soft, non-tender, non-distended. Skin/LE:  Warm, dry. Trace BLE edema.  Neuro: Alert and oriented to name, birthdate, place, and year.  Psych: Pleasant and appropriate.   Laboratory: Most recent CBC Lab Results  Component Value Date   WBC 9.0 02/09/2023   HGB 11.0 (L) 02/09/2023   HCT 33.1 (L) 02/09/2023   MCV 84.7 02/09/2023   PLT 206 02/09/2023   Most recent BMP    Latest Ref Rng  & Units 02/09/2023   12:47 PM  BMP  Glucose 70 - 99 mg/dL 161   BUN 8 - 23 mg/dL 48   Creatinine 0.96 - 1.00 mg/dL 0.45   Sodium 409 - 811 mmol/L 129   Potassium 3.5 - 5.1 mmol/L 4.6   Chloride 98 - 111 mmol/L 98   CO2 22 - 32 mmol/L 18   Calcium 8.9 - 10.3 mg/dL 9.1     Ivery Quale, MD 02/10/2023, 7:08 AM  PGY-1, [facility] Family Medicine FPTS Intern pager: [phone], text pages welcome Secure chat group [facility] Teaching Service

## 2023-02-11 DIAGNOSIS — J9601 Acute respiratory failure with hypoxia: Secondary | ICD-10-CM | POA: Diagnosis not present

## 2023-02-11 DIAGNOSIS — N179 Acute kidney failure, unspecified: Secondary | ICD-10-CM | POA: Insufficient documentation

## 2023-02-11 DIAGNOSIS — I959 Hypotension, unspecified: Secondary | ICD-10-CM | POA: Insufficient documentation

## 2023-02-11 LAB — BASIC METABOLIC PANEL
Anion gap: 11 (ref 5–15)
BUN: 54 mg/dL — ABNORMAL HIGH (ref 8–23)
CO2: 19 mmol/L — ABNORMAL LOW (ref 22–32)
Calcium: 8.6 mg/dL — ABNORMAL LOW (ref 8.9–10.3)
Chloride: 101 mmol/L (ref 98–111)
Creatinine, Ser: 2.73 mg/dL — ABNORMAL HIGH (ref 0.44–1.00)
GFR, Estimated: 17 mL/min — ABNORMAL LOW (ref >60)
Glucose, Bld: 120 mg/dL — ABNORMAL HIGH (ref 70–99)
Potassium: 3.8 mmol/L (ref 3.5–5.1)
Sodium: 131 mmol/L — ABNORMAL LOW (ref 135–145)

## 2023-02-11 LAB — CBC
HCT: 30.9 % — ABNORMAL LOW (ref 36.0–46.0)
Hemoglobin: 9.9 g/dL — ABNORMAL LOW (ref 12.0–15.0)
MCH: 28.4 pg (ref 26.0–34.0)
MCHC: 32 g/dL (ref 30.0–36.0)
MCV: 88.8 fL (ref 80.0–100.0)
Platelets: 193 10*3/uL (ref 150–400)
RBC: 3.48 MIL/uL — ABNORMAL LOW (ref 3.87–5.11)
RDW: 16.4 % — ABNORMAL HIGH (ref 11.5–15.5)
WBC: 7.3 10*3/uL (ref 4.0–10.5)
nRBC: 0 % (ref 0.0–0.2)

## 2023-02-11 LAB — GLUCOSE, CAPILLARY
Glucose-Capillary: 112 mg/dL — ABNORMAL HIGH (ref 70–99)
Glucose-Capillary: 115 mg/dL — ABNORMAL HIGH (ref 70–99)
Glucose-Capillary: 143 mg/dL — ABNORMAL HIGH (ref 70–99)
Glucose-Capillary: 156 mg/dL — ABNORMAL HIGH (ref 70–99)
Glucose-Capillary: 170 mg/dL — ABNORMAL HIGH (ref 70–99)

## 2023-02-11 MED ORDER — ALUM & MAG HYDROXIDE-SIMETH 200-200-20 MG/5ML PO SUSP
30.0000 mL | ORAL | Status: DC | PRN
  Administered 2023-02-11 – 2023-02-14 (×4): 30 mL via ORAL
  Filled 2023-02-11 (×4): qty 30

## 2023-02-11 MED ORDER — DOXYCYCLINE HYCLATE 100 MG PO TABS
100.0000 mg | ORAL_TABLET | Freq: Two times a day (BID) | ORAL | Status: AC
  Administered 2023-02-11 – 2023-02-13 (×5): 100 mg via ORAL
  Filled 2023-02-11 (×5): qty 1

## 2023-02-11 MED ORDER — METOPROLOL TARTRATE 25 MG PO TABS
25.0000 mg | ORAL_TABLET | Freq: Three times a day (TID) | ORAL | Status: DC
  Administered 2023-02-11 – 2023-02-14 (×9): 25 mg via ORAL
  Filled 2023-02-11 (×9): qty 1

## 2023-02-11 MED ORDER — LACTATED RINGERS IV BOLUS
500.0000 mL | Freq: Once | INTRAVENOUS | Status: AC
  Administered 2023-02-11: 500 mL via INTRAVENOUS

## 2023-02-11 MED ORDER — DOXEPIN HCL 10 MG PO CAPS
10.0000 mg | ORAL_CAPSULE | Freq: Once | ORAL | Status: AC
  Administered 2023-02-11: 10 mg via ORAL
  Filled 2023-02-11: qty 1

## 2023-02-11 NOTE — Discharge Instructions (Addendum)
Dear Abigail Wallace,  Thank you for letting us participate in your care. You were hospitalized for difficulty breathing and diagnosed with influenza infection and possible secondary bacterial pneumonia. You were treated with breathing treatments, Tamiflu, antibiotics, and as needed oxygen supplementation.   POST-HOSPITAL & CARE INSTRUCTIONS Please see the medication section of this packet for any changes in medication Go to your follow up appointments (listed below)   DOCTOR'S APPOINTMENT   Future Appointments  Date Time Provider Department Center  02/12/2023 11:30 AM Westley Chandler, MD [facility]  04/01/2023  7:05 AM [facility] DEVICE REMOTES [facility]  07/01/2023  7:05 AM [facility] DEVICE REMOTES [facility]  09/30/2023  7:15 AM [facility] DEVICE REMOTES [facility]  12/30/2023  7:05 AM [facility] DEVICE REMOTES [facility]  03/30/2024  7:05 AM [facility] DEVICE REMOTES [facility]  06/29/2024  7:05 AM [facility] DEVICE REMOTES [facility]  09/28/2024  7:05 AM [facility] DEVICE REMOTES [facility]  12/28/2024  7:05 AM [facility] DEVICE REMOTES [facility]     Take care and be well!  Family Medicine Teaching Service Inpatient Team [place]  [facility]  [address] [place], [place] [address] [phone]

## 2023-02-11 NOTE — Progress Notes (Signed)
Daily Progress Note Intern Pager: [phone]  Patient name: Abigail Wallace Medical record number: [id number] Date of birth: 1945/08/29 Age: 78 y.o. Gender: female  Primary Care Provider: Westley Chandler, MD Consultants: None  Code Status: FULL    Pt Overview and Major Events to Date:  02/08/23: Admit to FMTS    Assessment and Plan:   Beverlie Kurihara is a 78 y.o. female presenting with acute respiratory distress with PMH/PSH includes COPD on 2 L nasal cannula as needed, hypertension, diabetes, CHF, mild dementia, schizophrenia, urinary retention with chronic foley catheter in place. Admitted for new O2 requirement iso influenza and possible pneumonia on XR. Patient has been largely stable on prn home O2.  Some symptomatic hypotension this morning. Assessment & Plan Acute hypoxic respiratory failure (HCC) Known Influenza A, history of COPD and HFrEF. Concern for possibility of PNA on XR. Procal 0.34. Cr uptrending since admission 2.11 > 2.38 - will transition from IV lasix back down to home PO lasix. Overall, no significant extremity swelling, with some diffuse crackles. Satting well on 2L O2, which she occasionally uses at home (twice a year per son). Patient remains afebrile without leukocytosis.  -Transition to p.o. doxycycline 100 BID (end 1/29) - Continue tamiflu 30 daily (last dose 1/29) - Consider steroids if worsening - Hold home Lasix ISO AKI - Cont formulary equivalent of home Trelegy (Breo and Incruse Ellipta)  - Duonebs q4 prn  - Oxygen supplementation - goal 88-92% - Strict I/Os - Daily weights  - AM BMP, Mg -replete electrolytes as needed Hyponatremia Na stable at 130>131 this morning. May be chronic, has recent history of hyponatremia to 125-133.  - CTM  - AM BMP T2DM (type 2 diabetes mellitus) (HCC) CBGs remain stable. Home meds include: Tradjenta 5 daily. Last A1c 6.3 in Sept 2024.  - Continue home Tradjenta - CBGs q4 Hypotension Soft blood pressures this  morning, 70-80/30-40.  Symptomatic weakness. -Hold blood pressure medications this morning -CTM AKI (acute kidney injury) (HCC) Increase in creatinine today 2.38> 2.73.  Suspect secondary to recent IV diuresis. -Hold home Lasix -500 mL bolus LR  Chronic and Stable Issues: Afib- Cont home Eliquis 2.5 BID HLD- Cont home Pravastatin 40 daily  CKD- Cont home Sodium bicarb 650 BID HTN- Cont home Amlodipine 10 daily HFrEF- Cont home Metoprolol tartrate 100 TID, redosing lasix as indicated, has home dose  Anemia - chronic, stable around baseline (9-10) - CTM Gout- Cont home Allopurinol 100 daily Schizophrenia - Cont home Depakote 250 BID, Olanzapine 5 nightly  Constipation- Miralax 17 daily prn  Sleep disturbance- consider redosing doxepin as needed  FEN/GI: Heart Healthy PPx: Eliquis Dispo: Home pending continued clinical stability   Subjective:  Per patient's son, patient doing better today.  Slept better overnight.  Was able to sit up on side of bed yesterday.  Less spittle.  Feels weaker this morning.  Objective: Temp:  [98.3 F (36.8 C)-99 F (37.2 C)] 99 F (37.2 C) (01/26 2046) Pulse Rate:  [70-73] 73 (01/26 2046) Resp:  [18-19] 18 (01/26 2046) BP: (119-142)/(63-113) 142/113 (01/26 2046) SpO2:  [94 %-99 %] 94 % (01/26 2046) Physical Exam: General: No acute distress resting comfortably in room. CV: Normal S1/S2. No extra heart sounds. Warm and well-perfused. Pulm: Breathing comfortably on 2L O2.  No crackles or wheezes. No increased WOB. Abd: Soft, non-tender, non-distended. Skin:  Warm, dry.  Trace LE edema.  Neuro: Alert and oriented to self, place, year.  Laboratory: Most recent  CBC Lab Results  Component Value Date   WBC 8.3 02/10/2023   HGB 10.4 (L) 02/10/2023   HCT 31.4 (L) 02/10/2023   MCV 84.6 02/10/2023   PLT 203 02/10/2023   Most recent BMP    Latest Ref Rng & Units 02/10/2023   11:30 AM  BMP  Glucose 70 - 99 mg/dL 811   BUN 8 - 23 mg/dL 51    Creatinine 9.14 - 1.00 mg/dL 7.82   Sodium 956 - 213 mmol/L 130   Potassium 3.5 - 5.1 mmol/L 3.6   Chloride 98 - 111 mmol/L 98   CO2 22 - 32 mmol/L 19   Calcium 8.9 - 10.3 mg/dL 8.8    Ivery Quale, MD 02/11/2023, 7:14 AM  PGY-1, [place] Family Medicine FPTS Intern pager: [phone], text pages welcome Secure chat group [facility] Teaching Service

## 2023-02-11 NOTE — Progress Notes (Signed)
Physical Therapy Treatment Patient Details Name: Abigail Wallace MRN: [id number] DOB: October 10, 1945 Today's Date: 02/11/2023   History of Present Illness Pt is a 78 yr old female who presented due to respiratory distress. Pt is + influenza A.  Pt admitted due concerns of PNA.  PMH: COPD on 2L via [place], CHF, mild dementia, schizophrenia, urinary rentention with chronic foley cath in place, DM, pacemaker    PT Comments  Pt seen for PT tx with pt received asleep but awakened & agreeable to tx; son present in room. Pt agreeable to bed level exercises. Pt performs exercises with AAROM & cuing for technique but after 3 sets x 10 reps pt reports that's enough 2/2 fatigue. Will continue to follow pt acutely to address strengthening.    If plan is discharge home, recommend the following: Two people to help with walking and/or transfers;Two people to help with bathing/dressing/bathroom   Can travel by private vehicle        Equipment Recommendations  None recommended by PT    Recommendations for Other Services       Precautions / Restrictions Precautions Precautions: Fall Precaution Comments: on 2L at baseline Restrictions Weight Bearing Restrictions Per Provider Order: No     Mobility  Bed Mobility                    Transfers                        Ambulation/Gait                   Stairs             Wheelchair Mobility     Tilt Bed    Modified Rankin (Stroke Patients Only)       Balance                                            Cognition Arousal: Lethargic Behavior During Therapy: WFL for tasks assessed/performed Overall Cognitive Status: History of cognitive impairments - at baseline                                 General Comments: Pt follows simple commands throughout session, eager to see if she can perform exercises on her own. Sleepy (pt received asleep & awoken for session).        Exercises  General Exercises - Lower Extremity Heel Slides: Strengthening, Both, 10 reps, Supine, AAROM Hip ABduction/ADduction: Supine, AAROM, Strengthening, Both, 10 reps (hip adduction pillow squeezes, hip abduction slides all 10x)    General Comments        Pertinent Vitals/Pain Pain Assessment Pain Assessment: Faces Faces Pain Scale: Hurts little more Pain Location: chronic back pain Pain Descriptors / Indicators: Discomfort, Grimacing, Guarding Pain Intervention(s): Monitored during session, Patient requesting pain meds-RN notified    Home Living                          Prior Function            PT Goals (current goals can now be found in the care plan section) Acute Rehab PT Goals Patient Stated Goal: to go home PT Goal Formulation: With patient/family Time For Goal Achievement: 02/23/23 Potential to  Achieve Goals: Fair Progress towards PT goals: Progressing toward goals    Frequency    Min 1X/week      PT Plan      Co-evaluation              AM-PAC PT "6 Clicks" Mobility   Outcome Measure  Help needed turning from your back to your side while in a flat bed without using bedrails?: Total Help needed moving from lying on your back to sitting on the side of a flat bed without using bedrails?: Total Help needed moving to and from a bed to a chair (including a wheelchair)?: Total Help needed standing up from a chair using your arms (e.g., wheelchair or bedside chair)?: Total Help needed to walk in hospital room?: Total Help needed climbing 3-5 steps with a railing? : Total 6 Click Score: 6    End of Session Equipment Utilized During Treatment: Oxygen Activity Tolerance: Patient limited by fatigue Patient left: in bed;with call bell/phone within reach;with bed alarm set;with family/visitor present Nurse Communication: Mobility status (pt c/o nausea & pain, bed broken (continues to reverse trendelenburg on its own)) PT Visit Diagnosis: Other  abnormalities of gait and mobility (R26.89);Muscle weakness (generalized) (M62.81)     Time: 6045-4098 PT Time Calculation (min) (ACUTE ONLY): 8 min  Charges:    $Therapeutic Exercise: 8-22 mins PT General Charges $$ ACUTE PT VISIT: 1 Visit                     Aleda Grana, PT, DPT 02/11/23, 3:34 PM   Sandi Mariscal 02/11/2023, 3:34 PM

## 2023-02-11 NOTE — Plan of Care (Signed)
Problem: Clinical Measurements: Goal: Respiratory complications will improve Outcome: Progressing   Problem: Pain Managment: Goal: General experience of comfort will improve and/or be controlled Outcome: Progressing   Problem: Safety: Goal: Ability to remain free from injury will improve Outcome: Progressing

## 2023-02-11 NOTE — Assessment & Plan Note (Signed)
Soft blood pressures this morning, 70-80/30-40.  Symptomatic weakness. -Hold blood pressure medications this morning -CTM

## 2023-02-11 NOTE — Assessment & Plan Note (Signed)
Increase in creatinine today 2.38> 2.73.  Suspect secondary to recent IV diuresis. -Hold home Lasix -500 mL bolus LR

## 2023-02-11 NOTE — Assessment & Plan Note (Addendum)
Known Influenza A, history of COPD and HFrEF. Concern for possibility of PNA on XR. Procal 0.34. Cr uptrending since admission 2.11 > 2.38 - will transition from IV lasix back down to home PO lasix. Overall, no significant extremity swelling, with some diffuse crackles. Satting well on 2L O2, which she occasionally uses at home (twice a year per son). Patient remains afebrile without leukocytosis.  -Transition to p.o. doxycycline 100 BID (end 1/29) - Continue tamiflu 30 daily (last dose 1/29) - Consider steroids if worsening - Hold home Lasix ISO AKI - Cont formulary equivalent of home Trelegy (Breo and Incruse Ellipta)  - Duonebs q4 prn  - Oxygen supplementation - goal 88-92% - Strict I/Os - Daily weights  - AM BMP, Mg -replete electrolytes as needed

## 2023-02-11 NOTE — Plan of Care (Signed)
  Problem: Clinical Measurements: Goal: Will remain free from infection Outcome: Progressing Goal: Diagnostic test results will improve Outcome: Progressing Goal: Respiratory complications will improve Outcome: Progressing   Problem: Activity: Goal: Risk for activity intolerance will decrease Outcome: Progressing   Problem: Nutrition: Goal: Adequate nutrition will be maintained Outcome: Progressing   Problem: Education: Goal: Knowledge of General Education information will improve Description: Including pain rating scale, medication(s)/side effects and non-pharmacologic comfort measures Outcome: Not Progressing   Problem: Health Behavior/Discharge Planning: Goal: Ability to manage health-related needs will improve Outcome: Not Progressing   Problem: Clinical Measurements: Goal: Ability to maintain clinical measurements within normal limits will improve Outcome: Not Progressing

## 2023-02-11 NOTE — Assessment & Plan Note (Signed)
CBGs remain stable. Home meds include: Tradjenta 5 daily. Last A1c 6.3 in Sept 2024.  - Continue home Tradjenta - CBGs q4

## 2023-02-11 NOTE — Assessment & Plan Note (Addendum)
Na stable at 130>131 this morning. May be chronic, has recent history of hyponatremia to 125-133.  - CTM  - AM BMP

## 2023-02-12 ENCOUNTER — Other Ambulatory Visit: Payer: 59 | Admitting: Family Medicine

## 2023-02-12 ENCOUNTER — Telehealth: Payer: Self-pay

## 2023-02-12 ENCOUNTER — Inpatient Hospital Stay (HOSPITAL_COMMUNITY): Payer: 59

## 2023-02-12 DIAGNOSIS — N179 Acute kidney failure, unspecified: Secondary | ICD-10-CM | POA: Diagnosis not present

## 2023-02-12 DIAGNOSIS — J441 Chronic obstructive pulmonary disease with (acute) exacerbation: Secondary | ICD-10-CM

## 2023-02-12 DIAGNOSIS — J9621 Acute and chronic respiratory failure with hypoxia: Secondary | ICD-10-CM | POA: Diagnosis not present

## 2023-02-12 DIAGNOSIS — J111 Influenza due to unidentified influenza virus with other respiratory manifestations: Secondary | ICD-10-CM | POA: Diagnosis not present

## 2023-02-12 LAB — URINALYSIS, COMPLETE (UACMP) WITH MICROSCOPIC
Bilirubin Urine: NEGATIVE
Glucose, UA: 150 mg/dL — AB
Hgb urine dipstick: NEGATIVE
Ketones, ur: NEGATIVE mg/dL
Leukocytes,Ua: NEGATIVE
Nitrite: NEGATIVE
Protein, ur: 100 mg/dL — AB
Specific Gravity, Urine: 1.014 (ref 1.005–1.030)
pH: 6 (ref 5.0–8.0)

## 2023-02-12 LAB — MAGNESIUM: Magnesium: 2.1 mg/dL (ref 1.7–2.4)

## 2023-02-12 LAB — GLUCOSE, CAPILLARY
Glucose-Capillary: 104 mg/dL — ABNORMAL HIGH (ref 70–99)
Glucose-Capillary: 120 mg/dL — ABNORMAL HIGH (ref 70–99)
Glucose-Capillary: 126 mg/dL — ABNORMAL HIGH (ref 70–99)
Glucose-Capillary: 136 mg/dL — ABNORMAL HIGH (ref 70–99)
Glucose-Capillary: 137 mg/dL — ABNORMAL HIGH (ref 70–99)

## 2023-02-12 LAB — BASIC METABOLIC PANEL
Anion gap: 11 (ref 5–15)
BUN: 56 mg/dL — ABNORMAL HIGH (ref 8–23)
CO2: 21 mmol/L — ABNORMAL LOW (ref 22–32)
Calcium: 8.9 mg/dL (ref 8.9–10.3)
Chloride: 100 mmol/L (ref 98–111)
Creatinine, Ser: 2.95 mg/dL — ABNORMAL HIGH (ref 0.44–1.00)
GFR, Estimated: 16 mL/min — ABNORMAL LOW (ref >60)
Glucose, Bld: 121 mg/dL — ABNORMAL HIGH (ref 70–99)
Potassium: 4.2 mmol/L (ref 3.5–5.1)
Sodium: 132 mmol/L — ABNORMAL LOW (ref 135–145)

## 2023-02-12 MED ORDER — LACTATED RINGERS IV BOLUS
500.0000 mL | Freq: Once | INTRAVENOUS | Status: AC
  Administered 2023-02-12: 500 mL via INTRAVENOUS

## 2023-02-12 MED ORDER — DOXEPIN HCL 10 MG PO CAPS
10.0000 mg | ORAL_CAPSULE | Freq: Once | ORAL | Status: AC
  Administered 2023-02-12: 10 mg via ORAL
  Filled 2023-02-12: qty 1

## 2023-02-12 NOTE — Assessment & Plan Note (Addendum)
Known Influenza A, history of COPD and HFrEF. Concern for PNA on XR. Procal 0.34. Continues to sat well on 2L O2, which she occasionally uses at home (twice a year per son). Patient remains afebrile.  - Continue doxycycline 100 BID (end 1/29) - Continue tamiflu 30 daily (end 1/29) - Consider steroids if worsening - Hold home Lasix ISO AKI - Cont formulary equivalent of home Trelegy (Breo and Incruse Ellipta)  - Duonebs q4 prn  - Oxygen supplementation - goal 88-92% - Strict I/Os - Daily weights  - AM BMP, Mg -replete electrolytes as needed

## 2023-02-12 NOTE — TOC Initial Note (Addendum)
Transition of Care (TOC) - Initial/Assessment Note   Spoke to son [name] at bedside. PAtient from home with him with [facility] home health .[name] wishes to take his mom home at discharge with [facility].   Patient has home oxygen through [facility], hospital bed, hoyer lift and wheelchair .  Confirmed with Marylene Land with [name] patient is active with them for [facility] for catheter changes once a month unless she needs it more often for PRN visit. [name] can add HHPT. [name] requesting orders and face to face for [facility] and HHPT . Entered asked team to sign   At discharge patient will need ambulance transport home. NCM confirmed face sheet address with son  Patient Details  Name: Abigail Wallace MRN: [id number] Date of Birth: 1945/06/27  Transition of Care [facility]) CM/SW Contact:    Kingsley Plan, RN Phone Number: 02/12/2023, 11:08 AM  Clinical Narrative:                   Expected Discharge Plan: Home w Home Health Services Barriers to Discharge: Continued Medical Work up   Patient Goals and CMS Choice Patient states their goals for this hospitalization and ongoing recovery are:: son [name] wishes to take her home at discharge with [facility] home health   Choice offered to / list presented to : Adult Children      Expected Discharge Plan and Services   Discharge Planning Services: CM Consult Post Acute Care Choice: Home Health Living arrangements for the past 2 months: Apartment                 DME Arranged: N/A DME Agency: NA       HH Arranged: RN, PT HH Agency: [facility] ([name]) Date HH Agency Contacted: 02/12/23 Time HH Agency Contacted: 1107 Representative spoke with at [facility] Agency: Marylene Land  Prior Living Arrangements/Services Living arrangements for the past 2 months: Apartment Lives with:: Adult Children Patient language and need for interpreter reviewed:: Yes Do you feel safe going back to the place where you live?: Yes      Need for Family  Participation in Patient Care: Yes (Comment) Care giver support system in place?: Yes (comment) Current home services: Home RN Criminal Activity/Legal Involvement Pertinent to Current Situation/Hospitalization: No - Comment as needed  Activities of Daily Living   ADL Screening (condition at time of admission) Independently performs ADLs?: No Does the patient have a NEW difficulty with bathing/dressing/toileting/self-feeding that is expected to last >3 days?: No Does the patient have a NEW difficulty with getting in/out of bed, walking, or climbing stairs that is expected to last >3 days?: No Does the patient have a NEW difficulty with communication that is expected to last >3 days?: No Is the patient deaf or have difficulty hearing?: No Does the patient have difficulty seeing, even when wearing glasses/contacts?: No Does the patient have difficulty concentrating, remembering, or making decisions?: No  Permission Sought/Granted   Permission granted to share information with : Yes, Verbal Permission Granted  Share Information with NAME: son [name]  Permission granted to share info w AGENCY: [name]        Emotional Assessment Appearance:: Appears stated age       Alcohol / Substance Use: Not Applicable Psych Involvement: No (comment)  Admission diagnosis:  Acute pulmonary edema (HCC) [J81.0] Influenza [J11.1] Multifocal pneumonia [J18.9] Acute hypoxic respiratory failure (HCC) [J96.01] Acute on chronic hypoxic respiratory failure (HCC) [J96.21] Patient Active Problem List   Diagnosis Date Noted   Hypotension  02/11/2023   AKI (acute kidney injury) (HCC) 02/11/2023   Acute hypoxic respiratory failure (HCC) 02/08/2023   T2DM (type 2 diabetes mellitus) (HCC) 02/08/2023   Hyponatremia 02/08/2023   Meningioma (HCC) 04/06/2022   Urinary retention 04/06/2022   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   Cholesteatoma 12/24/2021   Advanced care planning/counseling discussion  09/29/2021   Chronic back pain 11/14/2018   Pulmonary nodules 03/03/2018   Paroxysmal atrial fibrillation (HCC) s/p cardioversion  01/28/2018   Dependence on wheelchair 05/17/2017   Abdominal aortic ectasia (HCC) 01/16/2017   Renal cyst 01/16/2017   CKD stage IIIb 09/12/2016   Well controlled type 2 diabetes mellitus (HCC) 03/20/2012   Gout 09/26/2011   Senile cataracts of both eyes 05/03/2011   Essential hypertension 04/26/2011   Tardive dyskinesia 04/26/2011   Cardiac pacemaker in situ 03/30/2010   Vaginal prolapse 03/27/2010   Obstructive sleep apnea 03/27/2010   Osteoarthritis of right knee 03/27/2010   Resting tremor 12/15/2009   Sick sinus syndrome (HCC) 04/24/2006   Schizophrenia (HCC) 03/14/2006   Chronic obstructive pulmonary disease (HCC) 03/14/2006   PCP:  Westley Chandler, MD Pharmacy:   [facility] - [place], [place] - [address] [place] [address] Phone: [phone] Fax: [phone]  [facility] [address] [place], [place] - [address] [place] [place] [address] Phone: [phone] Fax: [phone]     Social Drivers of Health (SDOH) Social History: SDOH Screenings   Food Insecurity: No Food Insecurity (02/09/2023)  Housing: Unknown (02/09/2023)  Transportation Needs: No Transportation Needs (02/09/2023)  Utilities: Not At Risk (02/09/2023)  Alcohol Screen: Low Risk  (12/27/2020)  Depression (PHQ2-9): High Risk (02/22/2022)  Financial Resource Strain: Low Risk  (03/23/2022)  Physical Activity: Inactive (12/27/2020)  Social Connections: Socially Isolated (02/09/2023)  Stress: No Stress Concern Present (12/27/2020)  Tobacco Use: Medium Risk (02/08/2023)   SDOH Interventions:     Readmission Risk Interventions     No data to display

## 2023-02-12 NOTE — Telephone Encounter (Signed)
Tried to schedule CT but unable due to patient currently in the hospital, they schedulers from [facility] inform that once she is discharge the CT appointment can be made. Forwarding to PCP for an FYI. Penni Bombard CMA

## 2023-02-12 NOTE — Assessment & Plan Note (Addendum)
Continued rise in creatinine today 2.38> 2.73>2.9.  Suspect secondary to recent IV diuresis vs CKD progression. -Continue to hold home Lasix -repeat 500 mL bolus LR -Exchange Foley today -UA -Renal ultrasound -Consider nephrology consult as needed

## 2023-02-12 NOTE — Progress Notes (Signed)
FMTS Brief Note In to see and examine patient as PCP. Discussed overall hospital course. Patient and son appreciative of care.   Terisa Starr, MD  Family Medicine Teaching Service

## 2023-02-12 NOTE — Telephone Encounter (Signed)
-----   Message from [facility] Jasmine December S sent at 02/11/2023  2:34 PM EST ----- Please call cone to schedule CT. Melvenia Beam

## 2023-02-12 NOTE — Progress Notes (Signed)
Daily Progress Note Intern Pager: [phone]  Patient name: Abigail Wallace Medical record number: [id number] Date of birth: 07/11/1945 Age: 78 y.o. Gender: female  Primary Care Provider: Westley Chandler, MD Consultants: None  Code Status: FULL    Pt Overview and Major Events to Date:  02/08/23: Admit to FMTS    Assessment and Plan:   Abigail Wallace is a 78 y.o. female presenting with acute respiratory distress with PMH/PSH includes COPD on 2 L nasal cannula as needed, hypertension, diabetes, CHF, mild dementia, schizophrenia, urinary retention with chronic foley catheter in place. Admitted for new O2 requirement iso influenza and possible pneumonia on XR. Patient has been largely stable on prn home O2.  Improving hypotension. Uptrending Cr.  Assessment & Plan Acute hypoxic respiratory failure (HCC) Known Influenza A, history of COPD and HFrEF. Concern for PNA on XR. Procal 0.34. Continues to sat well on 2L O2, which she occasionally uses at home (twice a year per son). Patient remains afebrile.  - Continue doxycycline 100 BID (end 1/29) - Continue tamiflu 30 daily (end 1/29) - Consider steroids if worsening - Hold home Lasix ISO AKI - Cont formulary equivalent of home Trelegy (Breo and Incruse Ellipta)  - Duonebs q4 prn  - Oxygen supplementation - goal 88-92% - Strict I/Os - Daily weights  - AM BMP, Mg -replete electrolytes as needed Hyponatremia Na stable at 131>132 this morning. May be chronic, has recent history of hyponatremia to 125-133.  - CTM  - AM BMP T2DM (type 2 diabetes mellitus) (HCC) CBGs remain stable. Home meds include: Tradjenta 5 daily. Last A1c 6.3 in Sept 2024.  - Continue home Tradjenta - CBGs q4 Hypotension Soft blood pressures morning of 1/27, 70-80/30-40.  Improved blood pressures today. -Continue to hold blood pressure medications -CTM AKI (acute kidney injury) (HCC) Continued rise in creatinine today 2.38> 2.73>2.9.  Suspect secondary to recent  IV diuresis vs CKD progression. -Continue to hold home Lasix -repeat 500 mL bolus LR -Exchange Foley today -UA -Renal ultrasound -Consider nephrology consult as needed  Chronic and Stable Issues: Afib- Cont home Eliquis 2.5 BID HLD- Cont home Pravastatin 40 daily  CKD- Cont home Sodium bicarb 650 BID HTN- Cont home Amlodipine 10 daily HFrEF- Cont home Metoprolol tartrate 100 TID, redosing lasix as indicated, has home dose  Anemia - chronic, stable around baseline (9-10) - CTM Gout- Cont home Allopurinol 100 daily Schizophrenia - Cont home Depakote 250 BID, Olanzapine 5 nightly  Constipation- Miralax 17 daily prn  Sleep disturbance- consider redosing doxepin as needed  FEN/GI: Heart Healthy PPx: Eliquis Dispo: Home pending continued clinical stability   Subjective:  Per son, patient having not as good of a day today compared to yesterday.  Breathing seems not as good as yesterday.  Did not sleep well overnight.   Objective: Temp:  [98.6 F (37 C)-99.6 F (37.6 C)] 98.9 F (37.2 C) (01/28 0746) Pulse Rate:  [59-80] 72 (01/28 0746) Resp:  [18-19] 19 (01/28 0746) BP: (79-141)/(33-57) 141/48 (01/28 0746) SpO2:  [92 %-99 %] 92 % (01/28 0746) Weight:  [140 kg] 140 kg (01/28 0505) Physical Exam: General: No acute distress.  Resting comfortably in room. CV: Normal S1/S2. No extra heart sounds. Warm and well-perfused. Pulm: Breathing comfortably 2L O2.  No crackles or wheezes, diminished breath sounds throughout. No increased WOB. Abd: Soft, non-tender, non-distended. Skin:  Warm, dry. Neuro: Alert and oriented to self, place, year.  Laboratory: Most recent CBC Lab Results  Component  Value Date   WBC 7.3 02/11/2023   HGB 9.9 (L) 02/11/2023   HCT 30.9 (L) 02/11/2023   MCV 88.8 02/11/2023   PLT 193 02/11/2023   Most recent BMP    Latest Ref Rng & Units 02/12/2023    6:19 AM  BMP  Glucose 70 - 99 mg/dL 696   BUN 8 - 23 mg/dL 56   Creatinine 2.95 - 1.00 mg/dL 2.84    Sodium 132 - 440 mmol/L 132   Potassium 3.5 - 5.1 mmol/L 4.2   Chloride 98 - 111 mmol/L 100   CO2 22 - 32 mmol/L 21   Calcium 8.9 - 10.3 mg/dL 8.9    Ivery Quale, MD 02/12/2023, 8:18 AM  PGY-1, [place] [facility] FPTS Intern pager: [phone], text pages welcome Secure chat group [facility] Teaching Service

## 2023-02-12 NOTE — Assessment & Plan Note (Signed)
CBGs remain stable. Home meds include: Tradjenta 5 daily. Last A1c 6.3 in Sept 2024.  - Continue home Tradjenta - CBGs q4

## 2023-02-12 NOTE — Assessment & Plan Note (Addendum)
Soft blood pressures morning of 1/27, 70-80/30-40.  Improved blood pressures today. -Continue to hold blood pressure medications -CTM

## 2023-02-12 NOTE — Assessment & Plan Note (Addendum)
Na stable at 131>132 this morning. May be chronic, has recent history of hyponatremia to 125-133.  - CTM  - AM BMP

## 2023-02-12 NOTE — Plan of Care (Signed)
  Problem: Education: Goal: Knowledge of General Education information will improve Description: Including pain rating scale, medication(s)/side effects and non-pharmacologic comfort measures Outcome: Progressing   Problem: Clinical Measurements: Goal: Ability to maintain clinical measurements within normal limits will improve Outcome: Progressing   Problem: Elimination: Goal: Will not experience complications related to bowel motility Outcome: Progressing Goal: Will not experience complications related to urinary retention Outcome: Progressing   Problem: Pain Managment: Goal: General experience of comfort will improve and/or be controlled Outcome: Progressing   Problem: Safety: Goal: Ability to remain free from injury will improve Outcome: Progressing   Problem: Skin Integrity: Goal: Risk for impaired skin integrity will decrease Outcome: Progressing

## 2023-02-13 DIAGNOSIS — J9601 Acute respiratory failure with hypoxia: Secondary | ICD-10-CM | POA: Diagnosis not present

## 2023-02-13 LAB — BASIC METABOLIC PANEL
Anion gap: 10 (ref 5–15)
BUN: 51 mg/dL — ABNORMAL HIGH (ref 8–23)
CO2: 23 mmol/L (ref 22–32)
Calcium: 9.1 mg/dL (ref 8.9–10.3)
Chloride: 100 mmol/L (ref 98–111)
Creatinine, Ser: 2.72 mg/dL — ABNORMAL HIGH (ref 0.44–1.00)
GFR, Estimated: 17 mL/min — ABNORMAL LOW (ref >60)
Glucose, Bld: 122 mg/dL — ABNORMAL HIGH (ref 70–99)
Potassium: 4 mmol/L (ref 3.5–5.1)
Sodium: 133 mmol/L — ABNORMAL LOW (ref 135–145)

## 2023-02-13 LAB — GLUCOSE, CAPILLARY: Glucose-Capillary: 150 mg/dL — ABNORMAL HIGH (ref 70–99)

## 2023-02-13 LAB — MAGNESIUM: Magnesium: 2.3 mg/dL (ref 1.7–2.4)

## 2023-02-13 MED ORDER — LACTATED RINGERS IV BOLUS
500.0000 mL | Freq: Once | INTRAVENOUS | Status: AC
  Administered 2023-02-13: 500 mL via INTRAVENOUS

## 2023-02-13 MED ORDER — POLYETHYLENE GLYCOL 3350 17 G PO PACK
17.0000 g | PACK | Freq: Every day | ORAL | Status: DC
  Administered 2023-02-13 – 2023-02-14 (×2): 17 g via ORAL
  Filled 2023-02-13 (×2): qty 1

## 2023-02-13 NOTE — Progress Notes (Signed)
Daily Progress Note Intern Pager: [phone]  Patient name: Ginnie Marich Medical record number: [id number] Date of birth: 01-19-1945 Age: 78 y.o. Gender: female  Primary Care Provider: Westley Chandler, MD Consultants: None  Code Status: FULL    Pt Overview and Major Events to Date:  02/08/23: Admit to FMTS    Assessment and Plan:   Jesenya Bowditch is a 78 y.o. female presenting with acute respiratory distress with PMH/PSH includes COPD on 2 L nasal cannula as needed, hypertension, diabetes, CHF, mild dementia, schizophrenia, urinary retention with chronic foley catheter in place. Admitted for new O2 requirement iso influenza and possible pneumonia on XR. Patient has been largely stable on prn home O2. Improving AKI. Assessment & Plan Acute hypoxic respiratory failure (HCC) Known Influenza A, history of COPD and HFrEF. Concern for PNA on XR. Procal 0.34. Continues to sat well on 2-3.5 L O2 this morning. Patient occasionally uses 2L at home (twice a year per son). Patient remains afebrile, without respiratory distress. - Cont doxycycline 100 BID (end PM 1/29) - S/p tamiflu 30 daily (ended 1/27) - Consider steroids if worsening - Hold home Lasix ISO AKI - Cont formulary equivalent of home Trelegy (Breo and Incruse Ellipta)  - Duonebs q4 prn  - Oxygen supplementation - goal 88-92% - Strict I/Os - Daily weights  - AM BMP, Mg -replete electrolytes as needed Hyponatremia Na stable at 132>133 this morning. May be chronic, has recent history of hyponatremia to 125-133.  - CTM  - AM BMP AKI (acute kidney injury) (HCC) Downtrending creatinine today 2.9>2.7 Suspect secondary to recent IV diuresis vs CKD progression. Exchanged foley on 1/28. UA unremarkable. Renal US unremarkable.  -Continue to hold home Lasix -Consider nephrology consult as needed Hypotension Soft blood pressures morning of 1/27, 70-80/30-40.  Held BP meds now with improved blood pressures.  -Continue to hold blood  pressure medications - resume on discharge -CTM T2DM (type 2 diabetes mellitus) (HCC) CBGs remain stable. Home meds include: Tradjenta 5 daily. Last A1c 6.3 in Sept 2024.  - Continue home Tradjenta - CBGs q morning  Chronic and Stable Issues: Afib- Cont home Eliquis 2.5 BID HLD- Cont home Pravastatin 40 daily  CKD- Cont home Sodium bicarb 650 BID HTN- Holding home Amlodipine 10 daily iso recent hypotension HFrEF- Cont home Metoprolol tartrate 100 TID, Holding lasix iso AKI Anemia - chronic, stable around baseline (9-10) - CTM Gout- Cont home Allopurinol 100 daily Schizophrenia - Cont home Depakote 250 BID, Olanzapine 5 nightly  Constipation- Miralax 17 daily   Sleep disturbance- consider redosing doxepin as needed   FEN/GI: Heart Healthy PPx: Eliquis Dispo: Home pending continued clinical stability   Subjective:  Patient doing okay this morning.  Reports her breathing feels "stifled."  Has been eating and drinking.  Objective: Temp:  [98.4 F (36.9 C)-98.9 F (37.2 C)] 98.5 F (36.9 C) (01/29 0427) Pulse Rate:  [61-72] 61 (01/29 0427) Resp:  [16-19] 18 (01/29 0427) BP: (137-156)/(48-56) 156/55 (01/29 0427) SpO2:  [92 %-95 %] 93 % (01/29 0427) Weight:  [140 kg] 140 kg (01/29 0429) Physical Exam: General: Well-appearing. Resting comfortably in room. CV: Normal S1/S2. No extra heart sounds. Warm and well-perfused. Pulm: Breathing comfortably on 3.5L O2. CTAB. No increased WOB. Abd: Soft, non-tender, non-distended. Skin:  Warm, dry. Trace ankle edema bilaterally.  Neuro: A&Ox4.  Laboratory: Most recent CBC Lab Results  Component Value Date   WBC 7.3 02/11/2023   HGB 9.9 (L) 02/11/2023   HCT  30.9 (L) 02/11/2023   MCV 88.8 02/11/2023   PLT 193 02/11/2023   Most recent BMP    Latest Ref Rng & Units 02/12/2023    6:19 AM  BMP  Glucose 70 - 99 mg/dL 161   BUN 8 - 23 mg/dL 56   Creatinine 0.96 - 1.00 mg/dL 0.45   Sodium 409 - 811 mmol/L 132   Potassium 3.5 - 5.1  mmol/L 4.2   Chloride 98 - 111 mmol/L 100   CO2 22 - 32 mmol/L 21   Calcium 8.9 - 10.3 mg/dL 8.9    UA unremarkable  Renal US: No hydronephrosis  Ivery Quale, MD 02/13/2023, 7:35 AM  PGY-1, [place] [facility] FPTS Intern pager: [phone], text pages welcome Secure chat group [facility] Teaching Service

## 2023-02-13 NOTE — Assessment & Plan Note (Addendum)
CBGs remain stable. Home meds include: Tradjenta 5 daily. Last A1c 6.3 in Sept 2024.  - Continue home Tradjenta - CBGs q morning

## 2023-02-13 NOTE — Assessment & Plan Note (Addendum)
Soft blood pressures morning of 1/27, 70-80/30-40.  Held BP meds now with improved blood pressures.  -Continue to hold blood pressure medications - resume on discharge -CTM

## 2023-02-13 NOTE — Progress Notes (Signed)
Occupational Therapy Treatment Patient Details Name: Abigail Wallace MRN: [id number] DOB: 1945/05/21 Today's Date: 02/13/2023   History of present illness Pt is a 78 yr old female who presented due to respiratory distress. Pt is + influenza A.  Pt admitted due concerns of PNA.  PMH: COPD on 2L via [place], CHF, mild dementia, schizophrenia, urinary rentention with chronic foley cath in place, DM, pacemaker   OT comments  Pt c/o pain at rest to back and BLEs. Pt son present during session and is caregiver for Pt at home. Pt has hoyer and hospital bed at home but Pt is afraid of hoyers because someone told her they were dangerous a while back, and son has not been able to convince her otherwise. Assisted Pt to bed with son and NT, max A x3. Pt unable to perform lateral scooting. Performed BUE/BLEs exercises at bedside. Pt total A x2 to return to bed. Pt educated on different lifts/stand devices to assist with transfers, and how hoyers are very safe if used correctly and appropriately. Pt open to trying lift tomorrow. Will continue to follow acutely and progress as able, HHOT follow up still recommended.       If plan is discharge home, recommend the following:  A lot of help with bathing/dressing/bathroom;Assistance with cooking/housework;Assistance with feeding;Direct supervision/assist for medications management;Direct supervision/assist for financial management;Assist for transportation;Help with stairs or ramp for entrance;Supervision due to cognitive status   Equipment Recommendations  None recommended by OT    Recommendations for Other Services      Precautions / Restrictions Precautions Precautions: Fall Precaution Comments: on 2L at baseline Restrictions Weight Bearing Restrictions Per Provider Order: No       Mobility Bed Mobility Overal bed mobility: Needs Assistance Bed Mobility: Rolling, Supine to Sit, Sit to Supine Rolling: Max assist, +2 for physical assistance   Supine to  sit: Max assist, +2 for physical assistance, HOB elevated Sit to supine: Total assist, +2 for physical assistance   General bed mobility comments: max for supine to sit and rolling, total for back to bed and positioning.    Transfers                   General transfer comment: declined     Balance   Sitting-balance support: Feet supported, Bilateral upper extremity supported Sitting balance-Leahy Scale: Poor Sitting balance - Comments: able to maintain balance sitting, poor trunk ROM                                   ADL either performed or assessed with clinical judgement   ADL Overall ADL's : Needs assistance/impaired                                            Extremity/Trunk Assessment Upper Extremity Assessment Upper Extremity Assessment: Generalized weakness            Vision       Perception     Praxis      Cognition Arousal: Alert Behavior During Therapy: WFL for tasks assessed/performed Overall Cognitive Status: History of cognitive impairments - at baseline                                 General  Comments: able to follow commands and participate, poor problem solving, very anxious about OOB activities, afraid to use hoyer or lifts due to someone telling her they were dangerous a while back.        Exercises      Shoulder Instructions       General Comments      Pertinent Vitals/ Pain       Pain Assessment Pain Assessment: 0-10 Pain Score: 8  Faces Pain Scale: Hurts little more Pain Location: chronic back pain, BLEs Pain Descriptors / Indicators: Discomfort, Grimacing, Guarding Pain Intervention(s): Monitored during session  Home Living                                          Prior Functioning/Environment              Frequency  Min 1X/week        Progress Toward Goals  OT Goals(current goals can now be found in the care plan section)  Progress  towards OT goals: Progressing toward goals  Acute Rehab OT Goals Patient Stated Goal: to improve strength OT Goal Formulation: With patient/family Time For Goal Achievement: 02/23/23 Potential to Achieve Goals: Good ADL Goals Pt Will Perform Eating: with modified independence;sitting Pt Will Perform Grooming: with min assist;bed level Pt Will Perform Upper Body Bathing: with min assist;sitting Pt Will Perform Lower Body Bathing: with max assist;bed level  Plan      Co-evaluation                 AM-PAC OT "6 Clicks" Daily Activity     Outcome Measure   Help from another person eating meals?: A Little Help from another person taking care of personal grooming?: A Lot Help from another person toileting, which includes using toliet, bedpan, or urinal?: Total Help from another person bathing (including washing, rinsing, drying)?: Total Help from another person to put on and taking off regular upper body clothing?: Total Help from another person to put on and taking off regular lower body clothing?: Total 6 Click Score: 9    End of Session    OT Visit Diagnosis: Muscle weakness (generalized) (M62.81);Cognitive communication deficit (R41.841);History of falling (Z91.81);Other abnormalities of gait and mobility (R26.89);Unsteadiness on feet (R26.81)   Activity Tolerance Patient tolerated treatment well   Patient Left in bed;with call bell/phone within reach;with family/visitor present   Nurse Communication Mobility status        Time: 1330-1402 OT Time Calculation (min): 32 min  Charges: OT General Charges $OT Visit: 1 Visit OT Treatments $Therapeutic Activity: 23-37 mins  [address] OTR/L   Alexis Goodell 02/13/2023, 2:19 PM

## 2023-02-13 NOTE — Assessment & Plan Note (Addendum)
Downtrending creatinine today 2.9>2.7 Suspect secondary to recent IV diuresis vs CKD progression. Exchanged foley on 1/28. UA unremarkable. Renal US unremarkable.  -Continue to hold home Lasix -Consider nephrology consult as needed

## 2023-02-13 NOTE — Plan of Care (Signed)
Pt is bed bound at home but able to sit on edge of bed with assistance of the son.   Problem: Activity: Goal: Risk for activity intolerance will decrease Outcome: Not Progressing   Problem: Nutrition: Goal: Adequate nutrition will be maintained Outcome: Progressing   Problem: Elimination: Goal: Will not experience complications related to urinary retention Outcome: Progressing   Problem: Safety: Goal: Ability to remain free from injury will improve Outcome: Progressing

## 2023-02-13 NOTE — Assessment & Plan Note (Addendum)
Na stable at 132>133 this morning. May be chronic, has recent history of hyponatremia to 125-133.  - CTM  - AM BMP

## 2023-02-13 NOTE — Plan of Care (Signed)
  Problem: Education: Goal: Knowledge of General Education information will improve Description: Including pain rating scale, medication(s)/side effects and non-pharmacologic comfort measures Outcome: Progressing   Problem: Clinical Measurements: Goal: Ability to maintain clinical measurements within normal limits will improve Outcome: Progressing   Problem: Nutrition: Goal: Adequate nutrition will be maintained Outcome: Progressing   Problem: Pain Managment: Goal: General experience of comfort will improve and/or be controlled Outcome: Progressing   Problem: Safety: Goal: Ability to remain free from injury will improve Outcome: Progressing   Problem: Skin Integrity: Goal: Risk for impaired skin integrity will decrease Outcome: Progressing

## 2023-02-13 NOTE — Assessment & Plan Note (Addendum)
Known Influenza A, history of COPD and HFrEF. Concern for PNA on XR. Procal 0.34. Continues to sat well on 2-3.5 L O2 this morning. Patient occasionally uses 2L at home (twice a year per son). Patient remains afebrile, without respiratory distress. - Cont doxycycline 100 BID (end PM 1/29) - S/p tamiflu 30 daily (ended 1/27) - Consider steroids if worsening - Hold home Lasix ISO AKI - Cont formulary equivalent of home Trelegy (Breo and Incruse Ellipta)  - Duonebs q4 prn  - Oxygen supplementation - goal 88-92% - Strict I/Os - Daily weights  - AM BMP, Mg -replete electrolytes as needed

## 2023-02-14 ENCOUNTER — Encounter: Payer: Self-pay | Admitting: Family Medicine

## 2023-02-14 ENCOUNTER — Other Ambulatory Visit (HOSPITAL_COMMUNITY): Payer: Self-pay

## 2023-02-14 ENCOUNTER — Telehealth: Payer: Self-pay | Admitting: Family Medicine

## 2023-02-14 DIAGNOSIS — R0683 Snoring: Secondary | ICD-10-CM

## 2023-02-14 DIAGNOSIS — N179 Acute kidney failure, unspecified: Secondary | ICD-10-CM | POA: Diagnosis not present

## 2023-02-14 DIAGNOSIS — J9621 Acute and chronic respiratory failure with hypoxia: Secondary | ICD-10-CM | POA: Diagnosis not present

## 2023-02-14 DIAGNOSIS — J111 Influenza due to unidentified influenza virus with other respiratory manifestations: Secondary | ICD-10-CM | POA: Diagnosis not present

## 2023-02-14 DIAGNOSIS — R339 Retention of urine, unspecified: Secondary | ICD-10-CM

## 2023-02-14 LAB — BASIC METABOLIC PANEL
Anion gap: 6 (ref 5–15)
BUN: 46 mg/dL — ABNORMAL HIGH (ref 8–23)
CO2: 21 mmol/L — ABNORMAL LOW (ref 22–32)
Calcium: 8.8 mg/dL — ABNORMAL LOW (ref 8.9–10.3)
Chloride: 107 mmol/L (ref 98–111)
Creatinine, Ser: 2.29 mg/dL — ABNORMAL HIGH (ref 0.44–1.00)
GFR, Estimated: 21 mL/min — ABNORMAL LOW (ref >60)
Glucose, Bld: 112 mg/dL — ABNORMAL HIGH (ref 70–99)
Potassium: 4 mmol/L (ref 3.5–5.1)
Sodium: 134 mmol/L — ABNORMAL LOW (ref 135–145)

## 2023-02-14 LAB — CBC
HCT: 30.3 % — ABNORMAL LOW (ref 36.0–46.0)
Hemoglobin: 9.5 g/dL — ABNORMAL LOW (ref 12.0–15.0)
MCH: 27.9 pg (ref 26.0–34.0)
MCHC: 31.4 g/dL (ref 30.0–36.0)
MCV: 89.1 fL (ref 80.0–100.0)
Platelets: 200 10*3/uL (ref 150–400)
RBC: 3.4 MIL/uL — ABNORMAL LOW (ref 3.87–5.11)
RDW: 16.2 % — ABNORMAL HIGH (ref 11.5–15.5)
WBC: 6.4 10*3/uL (ref 4.0–10.5)
nRBC: 0 % (ref 0.0–0.2)

## 2023-02-14 LAB — GLUCOSE, CAPILLARY: Glucose-Capillary: 104 mg/dL — ABNORMAL HIGH (ref 70–99)

## 2023-02-14 MED ORDER — METOPROLOL TARTRATE 25 MG PO TABS
25.0000 mg | ORAL_TABLET | Freq: Three times a day (TID) | ORAL | 0 refills | Status: DC
  Filled 2023-02-14: qty 90, 30d supply, fill #0

## 2023-02-14 MED ORDER — DOXYCYCLINE HYCLATE 100 MG PO TABS
100.0000 mg | ORAL_TABLET | Freq: Two times a day (BID) | ORAL | Status: AC
  Administered 2023-02-14: 100 mg via ORAL
  Filled 2023-02-14: qty 1

## 2023-02-14 NOTE — Assessment & Plan Note (Addendum)
CBGs remain stable. Home meds include: Tradjenta 5 daily. Last A1c 6.3 in Sept 2024.  - Continue home Tradjenta - CBGs q morning

## 2023-02-14 NOTE — Telephone Encounter (Signed)
RN Team-- please call [facility] and ask them to draw CBC and BMP week of 02/18/23.  Thank you! Terisa Starr, MD  Family Medicine Teaching Service

## 2023-02-14 NOTE — Plan of Care (Signed)
?  Problem: Health Behavior/Discharge Planning: ?Goal: Ability to manage health-related needs will improve ?Outcome: Not Progressing ?  ?Problem: Nutrition: ?Goal: Adequate nutrition will be maintained ?Outcome: Progressing ?  ?

## 2023-02-14 NOTE — Assessment & Plan Note (Addendum)
Received IV fluids with improving kidney function. Creatinine today 2.7>2.2. Suspect AKI secondary to recent IV diuresis vs CKD progression. Exchanged foley on 1/28. UA unremarkable. Renal US unremarkable.  -Continue to hold home Lasix -Consider nephrology consult as needed

## 2023-02-14 NOTE — Discharge Summary (Signed)
Family Medicine Teaching [facility] Discharge Summary  Patient name: Abigail Wallace Medical record number: [id number] Date of birth: [birth year] Age: 78 y.o. Gender: female Date of Admission: 02/08/2023  Date of Discharge: 02/14/23  Admitting Physician: Ivery Quale, MD  Primary Care Provider: Westley Chandler, MD Consultants: None  Indication for Hospitalization: AHRF  Discharge Diagnoses/Problem List:  Principal Problem for Admission: AHRF Other Problems addressed during stay:  Principal Problem:   Acute hypoxic respiratory failure (HCC) Active Problems:   T2DM (type 2 diabetes mellitus) (HCC)   Hyponatremia   Hypotension   AKI (acute kidney injury) (HCC)   Influenza   Acute on chronic hypoxic respiratory failure (HCC)   COPD exacerbation [facility])   Brief Hospital Course:  Abigail Wallace is a 78 y.o.female with a history of Afib on Eliquis, complete heart block with pacemaker, heart failure, CKD, COPD, CAD, gout, hypertension, schizophrenia, tardive dyskinesia, type 2 diabetes who was admitted to the [facility] Teaching Service at [facility] for dyspnea and flu-like symptoms. Her hospital course is detailed below:  Influenza A  AHRF Patient presented to the ED with 2 weeks of worsening breathing as well as cough and fever with known influenza A. Previously on room air at home, with occasional 2L O2 use, admitted for new O2 requirement. Initial BNP 470, below baseline. CXR with concern for PNA. Patient received levofloxacin and IV Lasix in ED. Transitioned to home PO lasix, which was then held due to rising Cr. Patient received breathing treatments and completed course of Tamiflu and doxycycline. Patient remained afebrile and stable around 2L O2 throughout admission.   Hyponatremia Hyponatremic to 126 during admission, remained largely stable throughout admission. May be chronic, has recent history of hyponatremia to 125-133. On discharge her sodium level was 134.  CKD  AKI Suspect  secondary to recent IV diuresis. Exchanged foley on 1/28. UA unremarkable. Renal US unremarkable. Held diuretics and gave fluids, with improvement of Cr. On discharge her Cr had improved to 2.29.  Other chronic conditions were medically managed with home medications and formulary alternatives as necessary (Afib, HLD, CKD, HFrEF, Anemia, Gout, schizophrenia, constipation, sleep disturbance)  PCP Follow-up Recommendations: Follow up Cr Patient's son prefers for patient to come off of gabapentin - assess chronic pain  Fu BP - BP meds held during admission due to soft BPs   Disposition: Home  Discharge Condition: Improved and stable   Discharge Exam:  Vitals:   02/14/23 0820 02/14/23 1004  BP: (!) 141/46 (!) 141/46  Pulse: 70 70  Resp: 18   Temp: 97.8 F (36.6 C)   SpO2: 95%    General: Well-appearing. Resting comfortably in room. CV: Normal S1/S2. No extra heart sounds. Warm and well-perfused. Pulm: Breathing comfortably on 2L O2. Some course breath sounds throughout, good air movement. No increased WOB. Abd: Soft, non-tender, non-distended. Skin/Ext:  Warm, dry. Stable trace LE ankle edema.  Significant Procedures: N/a  Significant Labs and Imaging:  Recent Labs  Lab 02/14/23 0528  WBC 6.4  HGB 9.5*  HCT 30.3*  PLT 200   Recent Labs  Lab 02/13/23 0719 02/14/23 0528  NA 133* 134*  K 4.0 4.0  CL 100 107  CO2 23 21*  GLUCOSE 122* 112*  BUN 51* 46*  CREATININE 2.72* 2.29*  CALCIUM 9.1 8.8*  MG 2.3  --    RPP: Flu A+ BNP 470 Procal 0.34   CXR 1/24: Pulmonary vascular congestion, interstitial edema  US Renal 1/28: No hydronephrosis.  Results/Tests Pending at  Time of Discharge: N/a  Discharge Medications:  Allergies as of 02/14/2023       Reactions   Aripiprazole Anaphylaxis, Other (See Comments), Shortness Of Breath   Tremors   Clozapine Anaphylaxis   Haloperidol Lactate Other (See Comments)   Severe tardive dyskinesias, somnolence, speech change     Benadryl [diphenhydramine Hcl] Other (See Comments)   States affects her vision   Benztropine Other (See Comments)   Affects mobility   Latuda [lurasidone Hcl] Other (See Comments)   "Tremors and shakes."    Lorazepam Other (See Comments)   Unknown reaction??   Remeron [mirtazapine] Other (See Comments)   Tremors and shakes   Keflex [cephalexin] Swelling, Other (See Comments)   Per patient, has tongue swelling with keflex but can tolerate amoxicillin   Codeine Other (See Comments)   Unknown reaction??   Latex Rash        Medication List     PAUSE taking these medications    furosemide 40 MG tablet Wait to take this until: February 16, 2023 Commonly known as: Lasix Take 1 tablet (40 mg total) by mouth daily.       STOP taking these medications    amLODipine 10 MG tablet Commonly known as: NORVASC   doxycycline 100 MG tablet Commonly known as: VIBRA-TABS   gabapentin 100 MG capsule Commonly known as: Neurontin   oseltamivir 30 MG capsule Commonly known as: Tamiflu   oxybutynin 10 MG 24 hr tablet Commonly known as: DITROPAN-XL       TAKE these medications    acetaminophen 500 MG tablet Commonly known as: TYLENOL Take 1,000 mg by mouth every 6 (six) hours as needed for mild pain or headache.   albuterol 108 (90 Base) MCG/ACT inhaler Commonly known as: VENTOLIN HFA Inhale 2 puffs into the lungs every 6 (six) hours as needed for wheezing or shortness of breath.   allopurinol 100 MG tablet Commonly known as: ZYLOPRIM Take 1 tablet (100 mg total) by mouth daily.   apixaban 2.5 MG Tabs tablet Commonly known as: ELIQUIS Take 1 tablet (2.5 mg total) by mouth 2 (two) times daily.   cholecalciferol 25 MCG (1000 UNIT) tablet Commonly known as: VITAMIN D3 Take 1 tablet (1,000 Units total) by mouth daily.   diclofenac Sodium 1 % Gel Commonly known as: Voltaren Apply 2 g topically 4 (four) times daily as needed (pain).   divalproex 250 MG DR  tablet Commonly known as: DEPAKOTE Take 1 tablet (250 mg total) by mouth every 12 (twelve) hours.   hydrocortisone cream 0.5 % Apply 1 application topically 2 (two) times daily. Apply WITH Nystatin under left breast   Incontinence Supplies Misc 1 Units by Does not apply route as needed.   ipratropium-albuterol 0.5-2.5 (3) MG/3ML Soln Commonly known as: DUONEB Take 3 mLs by nebulization every 4 (four) hours as needed.   lactulose 10 GM/15ML solution Commonly known as: CHRONULAC Take 15 mLs (10 g total) by mouth 2 (two) times daily as needed for mild constipation.   Lancet Device Misc 1 Device by Does not apply route 3 (three) times a week.   metoprolol tartrate 25 MG tablet Commonly known as: LOPRESSOR Take 1 tablet (25 mg total) by mouth 3 (three) times daily. What changed:  medication strength how much to take when to take this reasons to take this   nitroGLYCERIN 0.4 MG SL tablet Commonly known as: NITROSTAT Place 1 tablet (0.4 mg total) under the tongue every 5 (five) minutes as needed for  chest pain.   nystatin powder Commonly known as: MYCOSTATIN/NYSTOP Apply 1 Application topically 3 (three) times daily.   nystatin cream Commonly known as: MYCOSTATIN Apply 1 Application topically 2 (two) times daily. As needed in skin folds   OLANZapine 7.5 MG tablet Commonly known as: ZYPREXA Take 7.5 mg by mouth at bedtime.   OneTouch Delica Lancets 33G Misc Use to test once daily.  DX code:E11.9   OneTouch Verio test strip Generic drug: glucose blood Please use to check blood sugar once daily. E11.9   OneTouch Verio w/Device Kit Check blood sugar daily. E11.9   OneTouch Verio Flex System w/Device Kit Use to check blood glucose once daily   OVER THE COUNTER MEDICATION Apply 1 application  topically See admin instructions. Caldesene Medicated Protecting Powder- Apply under the breasts and between the skin folds 2 times a day as needed for irritation   polyethylene  glycol powder 17 GM/SCOOP powder Commonly known as: GLYCOLAX/MIRALAX Take 17 g by mouth 3 (three) times daily.   pravastatin 40 MG tablet Commonly known as: PRAVACHOL TAKE ONE TABLET BY MOUTH EVERY EVENING   sodium bicarbonate 650 MG tablet Take 1 tablet (650 mg total) by mouth 2 (two) times daily.   Tradjenta 5 MG Tabs tablet Generic drug: linagliptin TAKE ONE TABLET BY MOUTH DAILY   Trelegy Ellipta 100-62.5-25 MCG/ACT Aepb Generic drug: Fluticasone-Umeclidin-Vilant Inhale 1 puff into the lungs daily.        Discharge Instructions: Please refer to Patient Instructions section of EMR for full details.  Patient was counseled important signs and symptoms that should prompt return to medical care, changes in medications, dietary instructions, activity restrictions, and follow up appointments.   Follow-Up Appointments:  Follow-up Information     Westley Chandler, MD Follow up on 03/12/2023.   Specialty: Family Medicine Contact information: [address] [place] [address] [phone]         [facility] Follow up.   Specialty: Home Health Services Contact information: [address] [place] [address] [phone]                 Ivery Quale, MD 02/14/2023, 7:49 PM PGY-1, [facility] Family Medicine

## 2023-02-14 NOTE — Assessment & Plan Note (Addendum)
Known Influenza A, history of COPD and HFrEF. Concern for PNA on XR. Procal 0.34. Patient occasionally uses 2L at home (twice a year per son). Patient remains afebrile, without respiratory distress, breathing comfortably on 2L this morning.  - S/p doxycycline 100 BID (ended 1/29) - S/p tamiflu 30 daily (ended 1/27) - Hold home Lasix AKI - Cont formulary equivalent of home Trelegy (Breo and Incruse Ellipta)  - Duonebs q4 prn  - Oxygen supplementation - goal 88-92% - Strict I/Os - Daily weights

## 2023-02-14 NOTE — Progress Notes (Signed)
Received discharge order, patient's IV has been taken out and secured with dry gauze. Foley is in place, clean, dry, and intact to be sent home with foley catheter. Family member Clover Mealy has been notified about discharge and has been read the discharge summary, verbalized understanding. AVS and TOC medication has been gathered and placed inside patient belongings bag. Awaiting pick up by [facility].

## 2023-02-14 NOTE — Progress Notes (Signed)
Spoke with son. He would like patient to discharge after reviewing labs, vitals, events. Feels she will get better rest at home. Medically appropriate for discharge. Will plan for discharge home.  Terisa Starr, MD  Family Medicine Teaching Service

## 2023-02-14 NOTE — Progress Notes (Addendum)
Daily Progress Note Intern Pager: [phone]  Patient name: Abigail Wallace Medical record number: [id number] Date of birth: April 16, 1945 Age: 78 y.o. Gender: female  Primary Care Provider: Westley Chandler, MD Consultants: None  Code Status: FULL    Pt Overview and Major Events to Date:  02/08/23: Admit to FMTS    Assessment and Plan:   Abigail Wallace is a 78 y.o. female presenting with acute respiratory distress with PMH/PSH includes COPD on 2 L nasal cannula as needed, hypertension, diabetes, CHF, mild dementia, schizophrenia, urinary retention with chronic foley catheter in place. Admitted for new O2 requirement iso influenza and possible pneumonia on XR. Patient has been largely stable 2L O2. AKI continues to improve. Hopeful discharge home today.  Assessment & Plan Acute hypoxic respiratory failure (HCC) Known Influenza A, history of COPD and HFrEF. Concern for PNA on XR. Procal 0.34. Patient occasionally uses 2L at home (twice a year per son). Patient remains afebrile, without respiratory distress, breathing comfortably on 2L this morning.  - S/p doxycycline 100 BID (ended 1/29) - S/p tamiflu 30 daily (ended 1/27) - Hold home Lasix AKI - Cont formulary equivalent of home Trelegy (Breo and Incruse Ellipta)  - Duonebs q4 prn  - Oxygen supplementation - goal 88-92% - Strict I/Os - Daily weights  AKI (acute kidney injury) (HCC) Received IV fluids with improving kidney function. Creatinine today 2.7>2.2. Suspect AKI secondary to recent IV diuresis vs CKD progression. Exchanged foley on 1/28. UA unremarkable. Renal US unremarkable.  -Continue to hold home Lasix -Consider nephrology consult as needed Hyponatremia Na improved to 134 this morning. May have chronic hyponatremia, has recent history of Na to 125-133.  - CTM  Hypotension Soft blood pressures morning of 1/27, 70-80/30-40.  Held BP meds now with improved blood pressures.  -Continue to hold blood pressure medications -  resume on discharge -CTM T2DM (type 2 diabetes mellitus) (HCC) CBGs remain stable. Home meds include: Tradjenta 5 daily. Last A1c 6.3 in Sept 2024.  - Continue home Tradjenta - CBGs q morning  Chronic and Stable Issues: Afib- Cont home Eliquis 2.5 BID HLD- Cont home Pravastatin 40 daily  CKD- Cont home Sodium bicarb 650 BID HTN- Holding home Amlodipine 10 daily iso recent hypotension HFrEF- Cont home Metoprolol tartrate 100 TID, Holding lasix iso AKI Anemia - chronic, stable around baseline (9-10) - CTM Gout- Cont home Allopurinol 100 daily Schizophrenia - Cont home Depakote 250 BID, Olanzapine 5 nightly  Constipation- Miralax 17 daily   Sleep disturbance- has received Doxepin during admission with relief  FEN/GI: Heart Healthy PPx: Eliquis Dispo: Hopeful discharge home today   Subjective:  Patient doing well this morning. Hopeful to return home today.   Objective: Temp:  [98.2 F (36.8 C)-99.7 F (37.6 C)] 98.2 F (36.8 C) (01/30 0604) Pulse Rate:  [59-74] 59 (01/30 0604) Resp:  [18-19] 18 (01/30 0604) BP: (140-152)/(45-54) 140/45 (01/30 0604) SpO2:  [90 %-96 %] 96 % (01/30 0604) Weight:  [144.5 kg] 144.5 kg (01/30 0500) Physical Exam: General: Well-appearing. Resting comfortably in room. CV: Normal S1/S2. No extra heart sounds. Warm and well-perfused. Pulm: Breathing comfortably on 2L O2. Some course breath sounds throughout, good air movement. No increased WOB. Abd: Soft, non-tender, non-distended. Skin/Ext:  Warm, dry. Stable trace LE ankle edema.   Laboratory: Most recent CBC Lab Results  Component Value Date   WBC 6.4 02/14/2023   HGB 9.5 (L) 02/14/2023   HCT 30.3 (L) 02/14/2023   MCV 89.1 02/14/2023  PLT 200 02/14/2023   Most recent BMP    Latest Ref Rng & Units 02/14/2023    5:28 AM  BMP  Glucose 70 - 99 mg/dL 952   BUN 8 - 23 mg/dL 46   Creatinine 8.41 - 1.00 mg/dL 3.24   Sodium 401 - 027 mmol/L 134   Potassium 3.5 - 5.1 mmol/L 4.0   Chloride 98  - 111 mmol/L 107   CO2 22 - 32 mmol/L 21   Calcium 8.9 - 10.3 mg/dL 8.8    Ivery Quale, MD 02/14/2023, 8:08 AM  PGY-1, [facility] Family Medicine FPTS Intern pager: [phone], text pages welcome Secure chat group [facility] Teaching Service

## 2023-02-14 NOTE — TOC Progression Note (Addendum)
Transition of Care [facility]) - Progression Note    Patient Details  Name: Abigail Wallace MRN: [id number] Date of Birth: January 28, 1945  Transition of Care [facility] CM/SW Contact  Nadene Rubins Adria Devon, RN Phone Number: 02/14/2023, 9:54 AM  Clinical Narrative:     Received a secure chat from MD that patient has been discharge and to arrange PTAR.   NCM spoke to patient at bedside. No family present. Patient stated that the Fire Department has to meet the ambulance when she arrives home to assist her getting in.   NCM called son [name] [phone] and left message. Await call back.   Also sent secure chat to nurse and MD team reaging what time discharge paperwork will be given and patient requesting morning medications   Marylene Land with [name] aware discharge is today  Patient called son from cell phone. NCM confirmed address and [name] is currently at address awaiting her arrival. PTAR called.    Expected Discharge Plan: Home w Home Health Services Barriers to Discharge: Continued Medical Work up  Expected Discharge Plan and Services   Discharge Planning Services: CM Consult Post Acute Care Choice: Home Health Living arrangements for the past 2 months: Apartment Expected Discharge Date: 02/14/23               DME Arranged: N/A DME Agency: NA       HH Arranged: RN, PT HH Agency: [facility] ([name]) Date HH Agency Contacted: 02/12/23 Time HH Agency Contacted: 1107 Representative spoke with at [facility] Agency: Marylene Land   Social Determinants of Health (SDOH) Interventions SDOH Screenings   Food Insecurity: No Food Insecurity (02/09/2023)  Housing: Unknown (02/09/2023)  Transportation Needs: No Transportation Needs (02/09/2023)  Utilities: Not At Risk (02/09/2023)  Alcohol Screen: Low Risk  (12/27/2020)  Depression (PHQ2-9): High Risk (02/22/2022)  Financial Resource Strain: Low Risk  (03/23/2022)  Physical Activity: Inactive (12/27/2020)  Social Connections: Socially Isolated  (02/09/2023)  Stress: No Stress Concern Present (12/27/2020)  Tobacco Use: Medium Risk (02/08/2023)    Readmission Risk Interventions     No data to display

## 2023-02-14 NOTE — Assessment & Plan Note (Addendum)
Soft blood pressures morning of 1/27, 70-80/30-40.  Held BP meds now with improved blood pressures.  -Continue to hold blood pressure medications - resume on discharge -CTM

## 2023-02-14 NOTE — Progress Notes (Signed)
Medication details completed for AVS; AVS printed and place with patient chart.

## 2023-02-14 NOTE — Assessment & Plan Note (Addendum)
Na improved to 134 this morning. May have chronic hyponatremia, has recent history of Na to 125-133.  - CTM

## 2023-02-15 ENCOUNTER — Telehealth: Payer: Self-pay

## 2023-02-15 NOTE — Transitions of Care (Post Inpatient/ED Visit) (Signed)
02/15/2023  Name: Abigail Wallace MRN: [id number] DOB: [birth year]  Today's TOC FU Call Status:   Patient's Name and Date of Birth confirmed.  Transition Care Management Follow-up Telephone Call Date of Discharge: 02/14/23 Discharge Facility: [facility]) Type of Discharge: Inpatient Admission Primary Inpatient Discharge Diagnosis:: Acute Hypoxic Respiratory Failure Secondary to Influenza A How have you been since you were released from the hospital?: Better (Per patient's son, [name]) Any questions or concerns?: No  Items Reviewed: Did you receive and understand the discharge instructions provided?: Yes Medications obtained,verified, and reconciled?: Yes (Medications Reviewed) Any new allergies since your discharge?: No Dietary orders reviewed?: No Do you have support at home?: Yes People in Home: child(ren), adult Name of Support/Comfort Primary Source: [name]  Medications Reviewed Today: Medications Reviewed Today     Reviewed by Jodelle Gross, RN (Case Manager) on 02/15/23 at 1149  Med List Status: <None>   Medication Order Taking? Sig Documenting Provider Last Dose Status Informant  acetaminophen (TYLENOL) 500 MG tablet [id number] Yes Take 1,000 mg by mouth every 6 (six) hours as needed for mild pain or headache. [provider] Taking Active Child, Pharmacy Records  albuterol (VENTOLIN HFA) 108 (90 Base) MCG/ACT inhaler [id number] Yes Inhale 2 puffs into the lungs every 6 (six) hours as needed for wheezing or shortness of breath. Westley Chandler, MD Taking Active Child, Pharmacy Records  allopurinol (ZYLOPRIM) 100 MG tablet [id number] Yes Take 1 tablet (100 mg total) by mouth daily. Westley Chandler, MD Taking Active Child, Pharmacy Records  apixaban [facility]) 2.5 MG TABS tablet [id number] Yes Take 1 tablet (2.5 mg total) by mouth 2 (two) times daily. Westley Chandler, MD Taking Active Child, Pharmacy Records  Blood Glucose Monitoring Suppl [facility] VERIO FLEX  SYSTEM) w/Device Andria Rhein [id number] Yes Use to check blood glucose once daily Westley Chandler, MD Taking Active Child, Pharmacy Records  Blood Glucose Monitoring Suppl [facility] VERIO) w/Device KIT [id number] Yes Check blood sugar daily. E11.9 Carney Living, MD Taking Active Child, Pharmacy Records  cholecalciferol (VITAMIN D3) 25 MCG (1000 UNIT) tablet [id number] Yes Take 1 tablet (1,000 Units total) by mouth daily. Westley Chandler, MD Taking Active Child, Pharmacy Records  diclofenac Sodium (VOLTAREN) 1 % GEL [id number] Yes Apply 2 g topically 4 (four) times daily as needed (pain). Westley Chandler, MD Taking Active Child, Pharmacy Records  divalproex (DEPAKOTE) 250 MG DR tablet [id number] Yes Take 1 tablet (250 mg total) by mouth every 12 (twelve) hours. Rhetta Mura, MD Taking Active Child, Pharmacy Records  Fluticasone-Umeclidin-Vilant [facility] ELLIPTA) 100-62.5-25 MCG/ACT AEPB [id number] Yes Inhale 1 puff into the lungs daily. Westley Chandler, MD Taking Active Child, Pharmacy Records  furosemide (LASIX) 40 MG tablet [id number] No Take 1 tablet (40 mg total) by mouth daily.  Patient not taking: Reported on 02/15/2023   Westley Chandler, MD Not Taking Active Child, Pharmacy Records  glucose blood [facility] VERIO) test strip [id number] Yes Please use to check blood sugar once daily. E11.9 Westley Chandler, MD Taking Active Child, Pharmacy Records  hydrocortisone cream 0.5 % [id number] Yes Apply 1 application topically 2 (two) times daily. Apply WITH Nystatin under left breast Westley Chandler, MD Taking Active Child, Pharmacy Records  Incontinence Supplies MISC [id number] Yes 1 Units by Does not apply route as needed. McKeag, Janine Ores, MD Taking Active Child, Pharmacy Records  ipratropium-albuterol (DUONEB) 0.5-2.5 (3) MG/3ML SOLN [id number] No Take 3 mLs by nebulization every 4 (four) hours as needed.  Patient  not taking: Reported on 02/08/2023   Westley Chandler, MD Not Taking Active Child, Pharmacy  Records  lactulose [facility]) 10 GM/15ML solution [id number]  Take 15 mLs (10 g total) by mouth 2 (two) times daily as needed for mild constipation. Westley Chandler, MD  Active Child, Pharmacy Records  Lancet Device MISC [id number] Yes 1 Device by Does not apply route 3 (three) times a week. Billey Co, MD Taking Active Child, Pharmacy Records  metoprolol tartrate (LOPRESSOR) 25 MG tablet [id number] Yes Take 1 tablet (25 mg total) by mouth 3 (three) times daily. Levin Erp, MD Taking Active   nitroGLYCERIN (NITROSTAT) 0.4 MG SL tablet [id number]  Place 1 tablet (0.4 mg total) under the tongue every 5 (five) minutes as needed for chest pain. Thurmon Fair, MD  Expired 02/08/23 2359 Child, Pharmacy Records  nystatin (MYCOSTATIN/NYSTOP) powder [id number] No Apply 1 Application topically 3 (three) times daily. Westley Chandler, MD Unknown Active Child, Pharmacy Records  nystatin cream (MYCOSTATIN) [id number] Yes Apply 1 Application topically 2 (two) times daily. As needed in skin folds Westley Chandler, MD Taking Active Child, Pharmacy Records  OLANZapine [facility]) 7.5 MG tablet [id number] Yes Take 7.5 mg by mouth at bedtime. [provider] Taking Active Child, Pharmacy Records  [facility] Lancets [address] [place] [id number] Yes Use to test once daily.  DX code:E11.9 Billey Co, MD Taking Active Child, Pharmacy Records  OVER THE COUNTER MEDICATION [id number] No Apply 1 application  topically See admin instructions. Caldesene Medicated Protecting Powder- Apply under the breasts and between the skin folds 2 times a day as needed for irritation [provider] Unknown Active Child, Pharmacy Records  polyethylene glycol powder (GLYCOLAX/MIRALAX) 17 GM/SCOOP powder [id number] Yes Take 17 g by mouth 3 (three) times daily. Westley Chandler, MD Taking Active Child, Pharmacy Records  pravastatin (PRAVACHOL) 40 MG tablet [id number] Yes TAKE ONE TABLET BY MOUTH EVERY EVENING Croitoru, Mihai, MD  Taking Active Child, Pharmacy Records  sodium bicarbonate 650 MG tablet [id number] No Take 1 tablet (650 mg total) by mouth 2 (two) times daily. Westley Chandler, MD Unknown Active Child, Pharmacy Records  TRADJENTA 5 [facility] TABS tablet [id number] Yes TAKE ONE TABLET BY MOUTH DAILY Westley Chandler, MD Taking Active Child, Pharmacy Records  Med List Note Valda Lamb, CPhT 03/15/21 1616): Clover Mealy (Son) at [phone] [facility]) helps with medications.            Home Care and Equipment/Supplies: Were Home Health Services Ordered?: Yes Name of Home Health Agency:: [name] Has Agency set up a time to come to your home?: No EMR reviewed for Home Health Orders:  (Verified pts son has phone number. He states he has been managing his moms care for 12 years and he is good) Any new equipment or medical supplies ordered?: No  Functional Questionnaire: Do you need assistance with bathing/showering or dressing?: Yes Do you need assistance with meal preparation?: Yes Do you need assistance with eating?: No Do you have difficulty maintaining continence: Yes Do you need assistance with getting out of bed/getting out of a chair/moving?: Yes Do you have difficulty managing or taking your medications?: Yes  Follow up appointments reviewed: PCP Follow-up appointment confirmed?: Yes Date of PCP follow-up appointment?: 03/12/23 Follow-up Provider: Dr. Manson Passey Specialist [facility] Follow-up appointment confirmed?: NA Do you need transportation to your follow-up appointment?: No Do you understand care options if your condition(s) worsen?: Yes-patient verbalized understanding  SDOH Interventions Today    Flowsheet Row Most  Recent Value  SDOH Interventions   Food Insecurity Interventions Intervention Not Indicated  Housing Interventions Intervention Not Indicated  Transportation Interventions Intervention Not Indicated  Utilities Interventions Intervention Not Indicated     Jodelle Gross  RN, BSN, CCM [place]  [facility] Manager Population Health Direct Dial: [phone]  Fax: [phone]

## 2023-02-15 NOTE — Telephone Encounter (Signed)
Called son and discussed. He will message tomorrow with vitals and make changes from there. Terisa Starr, MD  Family Medicine Teaching Service

## 2023-02-15 NOTE — Telephone Encounter (Signed)
Spoke with [place] at [place] and provided with verbal orders.   Veronda Prude, RN

## 2023-02-16 DIAGNOSIS — J449 Chronic obstructive pulmonary disease, unspecified: Secondary | ICD-10-CM | POA: Diagnosis not present

## 2023-02-16 DIAGNOSIS — I951 Orthostatic hypotension: Secondary | ICD-10-CM | POA: Diagnosis not present

## 2023-02-16 DIAGNOSIS — M179 Osteoarthritis of knee, unspecified: Secondary | ICD-10-CM | POA: Diagnosis not present

## 2023-02-17 ENCOUNTER — Telehealth: Payer: Self-pay | Admitting: Family Medicine

## 2023-02-17 NOTE — Telephone Encounter (Signed)
**  After Hours/ Emergency Line Call**  Received a page to call [phone]) - [phone].  Patient: Abigail Wallace  Caller:  Son of patient, [name] Confirmed name & DOB of patient with caller  Subjective:     -Son [name] calling to report miscommunication regarding patient's blood draw after leaving hospital.  Patient was recently discharged 1/30 with home health -Per chart review patient was supposed to get CBC and BMP drawn by home health this week.  However [name] reports that home health RN stated that her orders were for next week.  Requesting that I call to confirm order -No further concerns.  The patient is doing well and has no concerns otherwise  -I called Joni Reining [phone] with [facility] home health and advised that she can obtain CBC and BMP when she see the patient tomorrow 2/3.  -No further concerns -Advised that in the future could handle this via MyChart instead of emergency line   -- Red flags discussed.   -- Will forward to PCP.  Vonna Drafts, MD [facility] Family Medicine Residency, PGY-2

## 2023-02-18 DIAGNOSIS — I10 Essential (primary) hypertension: Secondary | ICD-10-CM | POA: Diagnosis not present

## 2023-02-18 DIAGNOSIS — I5022 Chronic systolic (congestive) heart failure: Secondary | ICD-10-CM | POA: Diagnosis not present

## 2023-02-18 DIAGNOSIS — J9621 Acute and chronic respiratory failure with hypoxia: Secondary | ICD-10-CM | POA: Diagnosis not present

## 2023-02-18 DIAGNOSIS — J441 Chronic obstructive pulmonary disease with (acute) exacerbation: Secondary | ICD-10-CM | POA: Diagnosis not present

## 2023-02-18 DIAGNOSIS — I130 Hypertensive heart and chronic kidney disease with heart failure and stage 1 through stage 4 chronic kidney disease, or unspecified chronic kidney disease: Secondary | ICD-10-CM | POA: Diagnosis not present

## 2023-02-18 DIAGNOSIS — J449 Chronic obstructive pulmonary disease, unspecified: Secondary | ICD-10-CM | POA: Diagnosis not present

## 2023-02-18 DIAGNOSIS — Z466 Encounter for fitting and adjustment of urinary device: Secondary | ICD-10-CM | POA: Diagnosis not present

## 2023-02-19 DIAGNOSIS — I130 Hypertensive heart and chronic kidney disease with heart failure and stage 1 through stage 4 chronic kidney disease, or unspecified chronic kidney disease: Secondary | ICD-10-CM | POA: Diagnosis not present

## 2023-02-19 DIAGNOSIS — J441 Chronic obstructive pulmonary disease with (acute) exacerbation: Secondary | ICD-10-CM | POA: Diagnosis not present

## 2023-02-19 DIAGNOSIS — Z466 Encounter for fitting and adjustment of urinary device: Secondary | ICD-10-CM | POA: Diagnosis not present

## 2023-02-19 DIAGNOSIS — I5022 Chronic systolic (congestive) heart failure: Secondary | ICD-10-CM | POA: Diagnosis not present

## 2023-02-19 DIAGNOSIS — J9621 Acute and chronic respiratory failure with hypoxia: Secondary | ICD-10-CM | POA: Diagnosis not present

## 2023-02-19 LAB — LAB REPORT - SCANNED: EGFR: 27

## 2023-02-20 MED ORDER — FUROSEMIDE 40 MG PO TABS: 40.0000 mg | ORAL_TABLET | Freq: Every day | ORAL | 3 refills | Status: DC

## 2023-02-20 NOTE — Telephone Encounter (Signed)
 Called son to discuss labs.  Patient's creatinine is now 1.83.  Sodium 138, potassium 4.3.  Her bicarbonate is also appropriate.  Given these findings we will restart her home Lasix  at 40 mg daily.  I sent this to her pharmacy.  Page/ Chiquita can you please call [name] and ask to repeat metabolic panel week of 2/17?  Split night sleep study also ordered Suzann Daring, MD  [facility] Medicine Teaching Service

## 2023-02-20 NOTE — Addendum Note (Signed)
 Addended by: Bevin Bucks, Kasim Mccorkle on: 02/20/2023 03:49 PM   Modules accepted: Orders

## 2023-02-21 ENCOUNTER — Telehealth: Payer: Self-pay

## 2023-02-21 NOTE — Telephone Encounter (Signed)
 Community message sent to [employer] for Stat locks and overnight catheter bag.   Will await response.   Elsie Halo, RN

## 2023-02-21 NOTE — Telephone Encounter (Signed)
 Called [place], spoke with [place]. Provided verbal orders for BMP.  See separate telephone encounter regarding DME orders.   Valeta Gaudier Reneta Niehaus

## 2023-02-22 NOTE — Telephone Encounter (Signed)
 Receipt confirmed by [employer].   Veronda Prude, RN

## 2023-02-26 IMAGING — DX DG CHEST 2V
2 series · 2 of 2 positions shown · non-contrast
Comparison: March 16, 2021

CLINICAL DATA: Possible pneumonia.  Cough.

EXAM:
CHEST - 2 VIEW

[dg chest 2 view (1 of 2)]
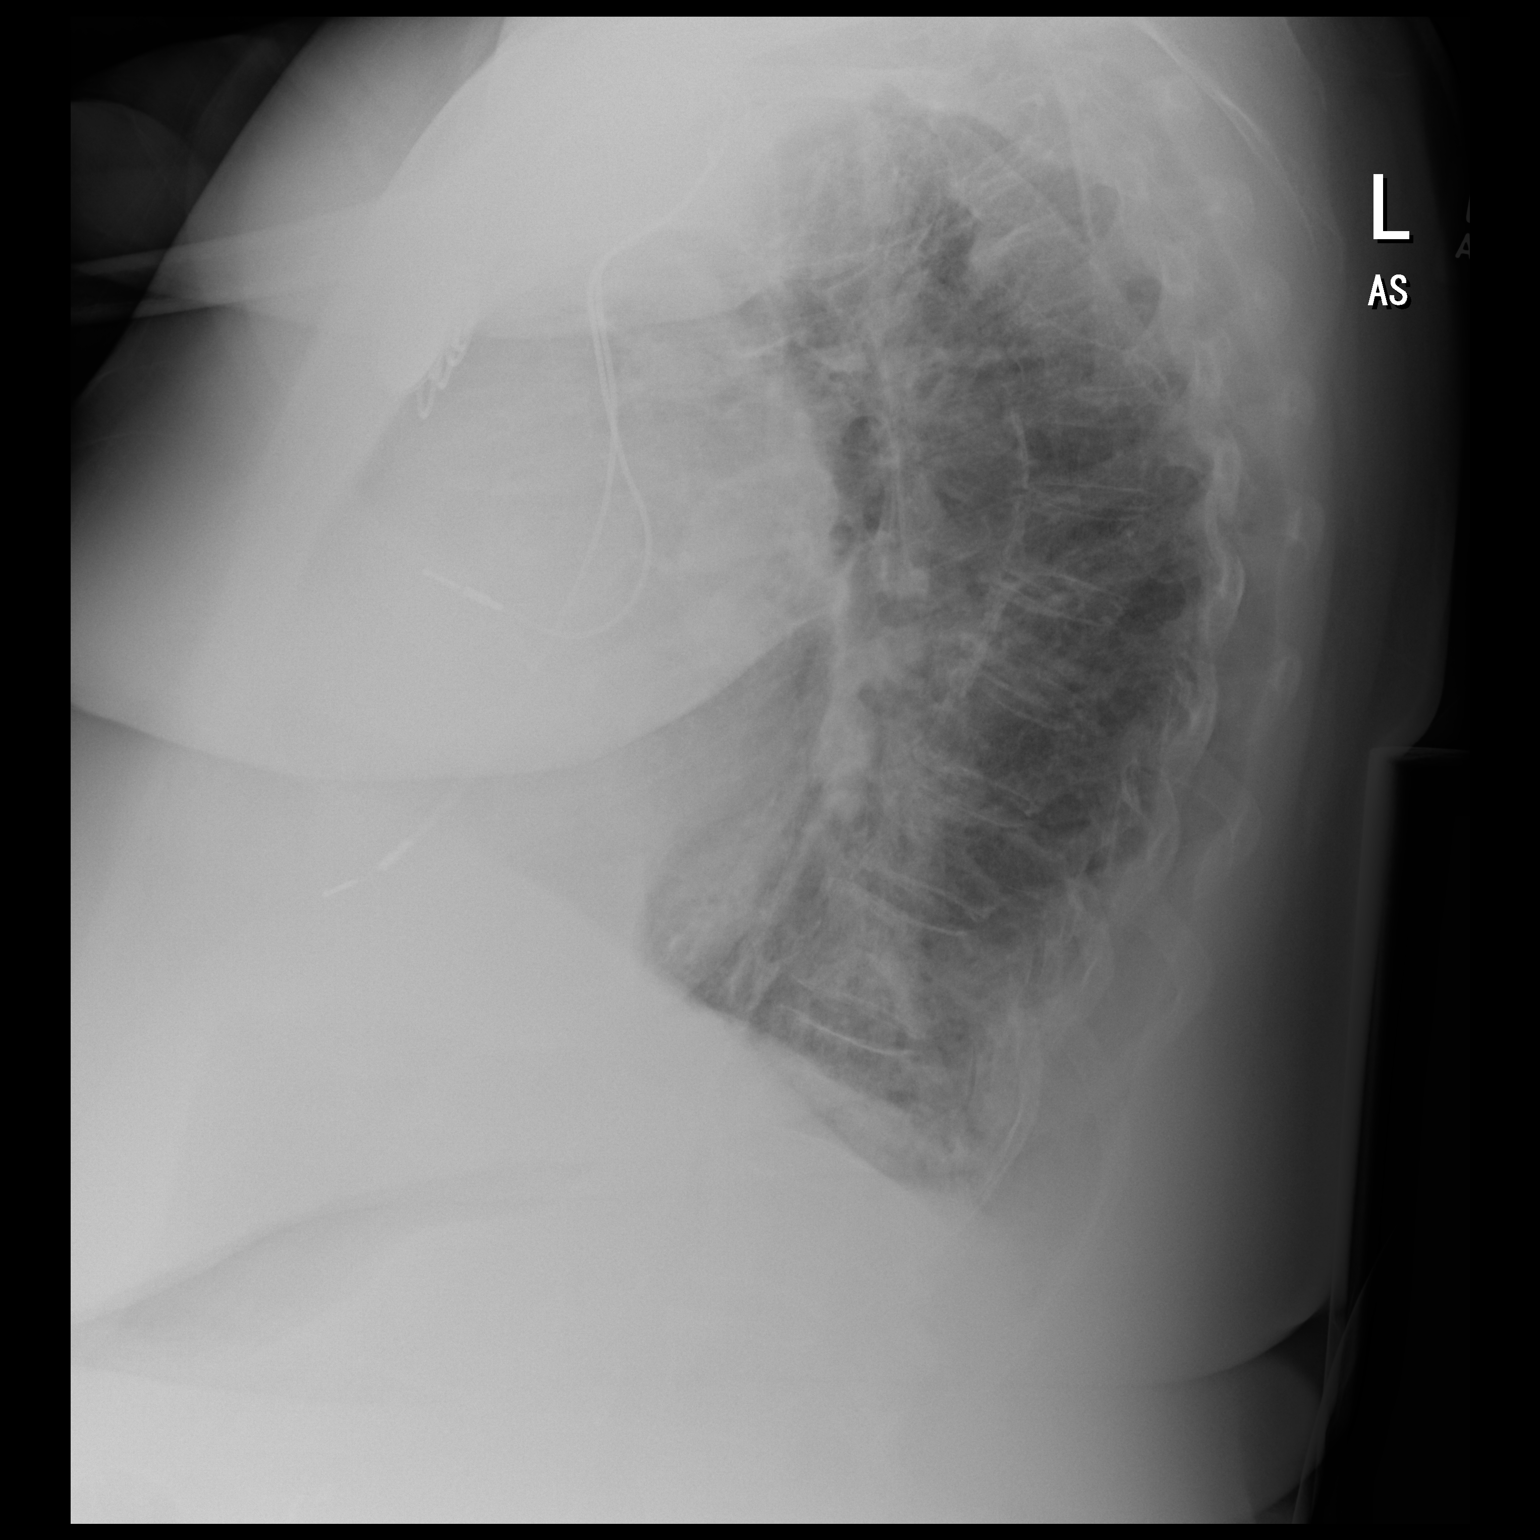

[dg chest 2 view (2 of 2)]
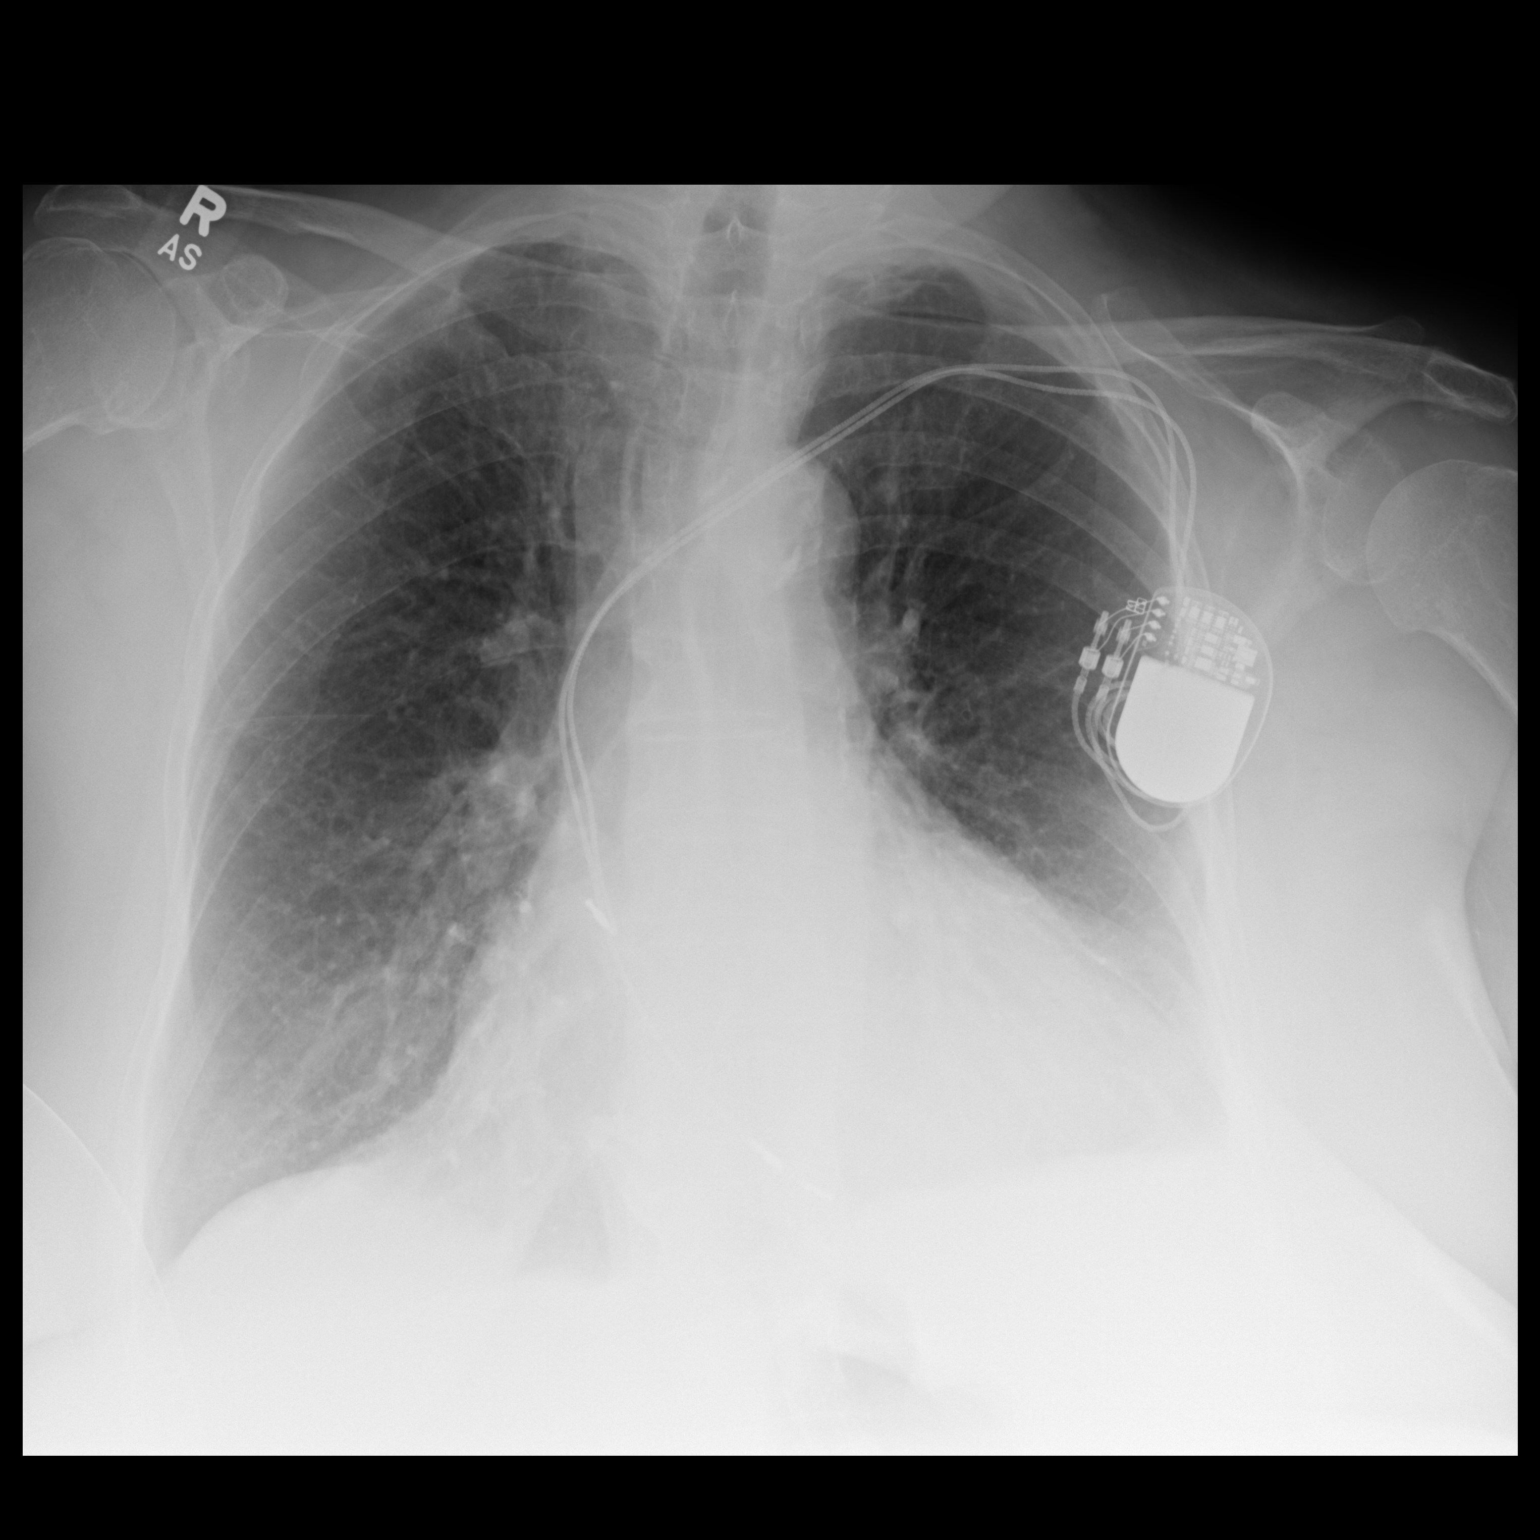

[2 of 2 positions shown; findings below may reference images not displayed]

FINDINGS: The heart size and mediastinal contours are stable. Cardiac
pacemaker is unchanged. Heart size enlarged. There is probable
minimal left pleural effusion. There is no focal pneumonia or
pulmonary edema. The visualized skeletal structures are
unremarkable.
IMPRESSION: No focal pneumonia. Probable minimal left pleural effusion.

## 2023-03-01 DIAGNOSIS — Z466 Encounter for fitting and adjustment of urinary device: Secondary | ICD-10-CM | POA: Diagnosis not present

## 2023-03-01 DIAGNOSIS — I130 Hypertensive heart and chronic kidney disease with heart failure and stage 1 through stage 4 chronic kidney disease, or unspecified chronic kidney disease: Secondary | ICD-10-CM | POA: Diagnosis not present

## 2023-03-01 DIAGNOSIS — I5022 Chronic systolic (congestive) heart failure: Secondary | ICD-10-CM | POA: Diagnosis not present

## 2023-03-01 DIAGNOSIS — J441 Chronic obstructive pulmonary disease with (acute) exacerbation: Secondary | ICD-10-CM | POA: Diagnosis not present

## 2023-03-01 DIAGNOSIS — J9621 Acute and chronic respiratory failure with hypoxia: Secondary | ICD-10-CM | POA: Diagnosis not present

## 2023-03-03 DIAGNOSIS — R339 Retention of urine, unspecified: Secondary | ICD-10-CM | POA: Diagnosis not present

## 2023-03-04 ENCOUNTER — Encounter: Payer: Self-pay | Admitting: Family Medicine

## 2023-03-05 ENCOUNTER — Telehealth: Payer: Self-pay

## 2023-03-05 DIAGNOSIS — E1122 Type 2 diabetes mellitus with diabetic chronic kidney disease: Secondary | ICD-10-CM | POA: Diagnosis not present

## 2023-03-05 DIAGNOSIS — I130 Hypertensive heart and chronic kidney disease with heart failure and stage 1 through stage 4 chronic kidney disease, or unspecified chronic kidney disease: Secondary | ICD-10-CM | POA: Diagnosis not present

## 2023-03-05 DIAGNOSIS — Z466 Encounter for fitting and adjustment of urinary device: Secondary | ICD-10-CM | POA: Diagnosis not present

## 2023-03-05 DIAGNOSIS — J9621 Acute and chronic respiratory failure with hypoxia: Secondary | ICD-10-CM | POA: Diagnosis not present

## 2023-03-05 DIAGNOSIS — J441 Chronic obstructive pulmonary disease with (acute) exacerbation: Secondary | ICD-10-CM | POA: Diagnosis not present

## 2023-03-05 DIAGNOSIS — I5022 Chronic systolic (congestive) heart failure: Secondary | ICD-10-CM | POA: Diagnosis not present

## 2023-03-05 NOTE — Telephone Encounter (Signed)
 Called [place] and advised of PCP message.   Provided her with phone number for [facility].   Veronda Prude, RN

## 2023-03-05 NOTE — Telephone Encounter (Signed)
 Received call from [facility], Armed forces technical officer at [facility] regarding patient.   She reports that they were able to get BMP today. She also reports issues with catheterization. States that they initially used a 16 F catheter, however, within 30 minutes, catheter was leaking. They replaced this with a 18 F and received 200 ml clear, yellow urine. No other issues post catheterization.   They are asking for provider okay to continue with 40 F for catheter replacements.   Requesting returned call at [phone].   Thanks.   Veronda Prude, RN

## 2023-03-05 NOTE — Telephone Encounter (Signed)
 Please ask [name] to call [facility] as they typically manage catheter   Terisa Starr, MD  [facility] Medicine Teaching Service

## 2023-03-06 DIAGNOSIS — J811 Chronic pulmonary edema: Secondary | ICD-10-CM | POA: Diagnosis not present

## 2023-03-06 LAB — BASIC METABOLIC PANEL: EGFR: 24

## 2023-03-08 DIAGNOSIS — J441 Chronic obstructive pulmonary disease with (acute) exacerbation: Secondary | ICD-10-CM | POA: Diagnosis not present

## 2023-03-08 DIAGNOSIS — I5022 Chronic systolic (congestive) heart failure: Secondary | ICD-10-CM | POA: Diagnosis not present

## 2023-03-08 DIAGNOSIS — J9621 Acute and chronic respiratory failure with hypoxia: Secondary | ICD-10-CM | POA: Diagnosis not present

## 2023-03-08 DIAGNOSIS — I130 Hypertensive heart and chronic kidney disease with heart failure and stage 1 through stage 4 chronic kidney disease, or unspecified chronic kidney disease: Secondary | ICD-10-CM | POA: Diagnosis not present

## 2023-03-08 DIAGNOSIS — Z466 Encounter for fitting and adjustment of urinary device: Secondary | ICD-10-CM | POA: Diagnosis not present

## 2023-03-09 ENCOUNTER — Telehealth: Payer: Self-pay | Admitting: Family Medicine

## 2023-03-09 NOTE — Telephone Encounter (Signed)
 Patient on my schedule for Tuesday for office visit. I do not have home visits this day (no admin this week for home visit either).  Please call son and ask if They want to do a virtual visit If I can come March 25 for home visit  I am happy to see in person as well if they prefer  Terisa Starr, MD  [facility] Medicine Teaching Service

## 2023-03-11 NOTE — Telephone Encounter (Signed)
 Please call son  Patient is scheduled for in person visit tomorrow in office I can do a home visit the end of the month  Okay to convert tomorrow's visit to virtual to discuss urine and mouth photos

## 2023-03-12 ENCOUNTER — Ambulatory Visit (INDEPENDENT_AMBULATORY_CARE_PROVIDER_SITE_OTHER): Payer: 59 | Admitting: Family Medicine

## 2023-03-12 ENCOUNTER — Telehealth: Payer: Self-pay | Admitting: Family Medicine

## 2023-03-12 DIAGNOSIS — F209 Schizophrenia, unspecified: Secondary | ICD-10-CM

## 2023-03-12 NOTE — Telephone Encounter (Signed)
 Called son to discuss concerns. Vitals have been excellent. CBG 120-130. BP on wrist 130-150/65. Stressors at home include change in aids. Urine output is doing great. Intake has been poor due to patient belief is poisoned. Will pause Lasix 2 days then restart.    RN Team- please call [name] AFTER 3 PM today to order:  - CBC and BMP on Friday 2/28

## 2023-03-12 NOTE — Telephone Encounter (Signed)
 Patient was seen today 03/12/23. Message has been resolved. Penni Bombard CMA

## 2023-03-12 NOTE — Progress Notes (Signed)
 Conducted phone visit this AM. See note. Total time 15 minutes. Due to concerns about behavior, scheduled for home  visit at next available time.   Terisa Starr, MD  Family Medicine Teaching Service

## 2023-03-12 NOTE — Telephone Encounter (Signed)
 Per Manson Passey, this has been taken care of.

## 2023-03-13 ENCOUNTER — Telehealth: Payer: Self-pay | Admitting: Family Medicine

## 2023-03-13 ENCOUNTER — Encounter: Payer: Self-pay | Admitting: Family Medicine

## 2023-03-13 NOTE — Telephone Encounter (Signed)
 Called [name] and provided with verbal orders for both CBC and BMP.   Veronda Prude, RN

## 2023-03-13 NOTE — Telephone Encounter (Signed)
 Reviewed labs  RN Team- please call [name] and ask them to obtain labs Friday 2/28 CBC and BMP  Thank you Terisa Starr, MD  [facility] Medicine Teaching Service

## 2023-03-14 DIAGNOSIS — R338 Other retention of urine: Secondary | ICD-10-CM | POA: Diagnosis not present

## 2023-03-15 DIAGNOSIS — Z0189 Encounter for other specified special examinations: Secondary | ICD-10-CM | POA: Diagnosis not present

## 2023-03-15 DIAGNOSIS — I130 Hypertensive heart and chronic kidney disease with heart failure and stage 1 through stage 4 chronic kidney disease, or unspecified chronic kidney disease: Secondary | ICD-10-CM | POA: Diagnosis not present

## 2023-03-15 DIAGNOSIS — J9621 Acute and chronic respiratory failure with hypoxia: Secondary | ICD-10-CM | POA: Diagnosis not present

## 2023-03-15 DIAGNOSIS — I5022 Chronic systolic (congestive) heart failure: Secondary | ICD-10-CM | POA: Diagnosis not present

## 2023-03-15 DIAGNOSIS — J441 Chronic obstructive pulmonary disease with (acute) exacerbation: Secondary | ICD-10-CM | POA: Diagnosis not present

## 2023-03-15 DIAGNOSIS — R338 Other retention of urine: Secondary | ICD-10-CM | POA: Diagnosis not present

## 2023-03-15 DIAGNOSIS — Z466 Encounter for fitting and adjustment of urinary device: Secondary | ICD-10-CM | POA: Diagnosis not present

## 2023-03-15 NOTE — Telephone Encounter (Signed)
 Called Abigail Wallace.   Long discussion with Abigail Wallace. Discussed monitoring her over the weekend and discussed PO intake.   He reports he is hoping she will sleep tonight and feel better tomorrow.   He reports she ate two grilled sandwiches and he has been encouraging her to drink.   He reports if she does not sleep and/improve in the next 24 hours he will take her to the ED.

## 2023-03-15 NOTE — Telephone Encounter (Signed)
 [place] Client [facility] Nurse with Cindie Laroche calls nurse line in regards to patient.   She reports the son reporting burning with urination and confusion. She denies any fevers.   She reports the son stated she has been confused "off and on" for ~ 1 week. She reports she does suffer from sun downers.   [place] Client advised to do urine culture on current cath.   Discussed ED precautions with her and Son.   Advised [name] we would call him back if requested ED visit.

## 2023-03-15 NOTE — Telephone Encounter (Signed)
 Please let son know to monitor symptoms and vitals over weekend--if not taking PO should present to ED over weekend. Terisa Starr, MD  Family Medicine Teaching Service

## 2023-03-16 LAB — LAB REPORT - SCANNED: EGFR: 30

## 2023-03-19 DIAGNOSIS — I130 Hypertensive heart and chronic kidney disease with heart failure and stage 1 through stage 4 chronic kidney disease, or unspecified chronic kidney disease: Secondary | ICD-10-CM | POA: Diagnosis not present

## 2023-03-19 DIAGNOSIS — Z466 Encounter for fitting and adjustment of urinary device: Secondary | ICD-10-CM | POA: Diagnosis not present

## 2023-03-19 DIAGNOSIS — I5022 Chronic systolic (congestive) heart failure: Secondary | ICD-10-CM | POA: Diagnosis not present

## 2023-03-19 DIAGNOSIS — J9621 Acute and chronic respiratory failure with hypoxia: Secondary | ICD-10-CM | POA: Diagnosis not present

## 2023-03-19 DIAGNOSIS — J441 Chronic obstructive pulmonary disease with (acute) exacerbation: Secondary | ICD-10-CM | POA: Diagnosis not present

## 2023-03-19 NOTE — Telephone Encounter (Signed)
 Called son. Discussed results. Will go over in more detail at visit.   Terisa Starr, MD  Family Medicine Teaching Service

## 2023-03-19 NOTE — Telephone Encounter (Signed)
 Received returned call from [name]. [name] reports that urine was obtained and that culture was ordered and [facility], however, appears to be a possible issue on [facility]'s end with processing.   At this point, we would need another sample. She reports that we can add a PRN visit if provider would like another UA and culture.   Will forward to PCP.   Veronda Prude, RN

## 2023-03-19 NOTE — Telephone Encounter (Signed)
 Please call [facility] and see if they have urine for culture still.  Terisa Starr, MD  Family Medicine Teaching Service

## 2023-03-19 NOTE — Telephone Encounter (Signed)
 Called [name] x2.   First encounter, held for 10 minutes, line was disconnected.   Second encounter- spoke with [facility]. I was advised that clinical team would have to call our office back.   Veronda Prude, RN

## 2023-03-19 NOTE — Telephone Encounter (Signed)
 Spoke with [facility] regarding fax. She was able to print off labs from [place]. Labs placed in PCP box for review.   Veronda Prude, RN

## 2023-03-19 NOTE — Telephone Encounter (Signed)
 Please call [facility] and ask them to fax results ASAP  I have not been getting any [place] documentation yet

## 2023-03-20 NOTE — Telephone Encounter (Signed)
 Will ask son and patient on 3/6 visit Abigail Starr, MD  [facility] Medicine Teaching Service

## 2023-03-20 NOTE — Progress Notes (Unsigned)
 SUBJECTIVE:   CHIEF COMPLAINT: diet, check up  HPI:  This visit was conducted in the patient's home as she is is wheelchair dependent  Abigail Wallace is a 78 y.o.  with history notable for COPD intermittently on home oxygen although this is now resolved, type 2 diabetes that is well-controlled and chronic kidney disease presenting for follow-up.  She is joined by her son for part of the visit..  The patient has a chronic indwelling Foley catheter.  She reports some burning intermittently at times.  No fevers nausea vomiting or chills.  She did have an episode last night where there was leakage around the catheter.  This was recently changed.  Reviewed the results with them at length.  The patient is really working on her strength.  She has been able to stand with a walker several times with her son.  It has been several months that she is been able to successfully transfer from the bed.  This is a huge step in progress.  Her son is working with her regularly to get her stronger.  She also has physical therapy there have been no falls.  Otherwise the patient reports she is doing well.  Her mood has been quite good the last few days.  She has an upcoming party on March 30.  Abigail Wallace (son) has documentation of vitals.  Pulse ox range 96-98 on room air.  She has been afebrile.  Blood pressures range from 120s to 130s over 60s to 70s.  There are several values in the 150s or 170s systolic but these are rare.  In terms of the patient's heart failure she is back on her Lasix.  This had been paused in light of an acute kidney injury after recent bout with influenza.  She is urinating well.  She denies dyspnea.  No ongoing coughing or chest pain.  In terms of her diabetes, she is working on increasing activity and reducing intake.  24 hour recall Breakfast- yogurt Lunch- salad Snack- loves chicken bistkit crackers  Dinner- usually chicken, yellow rice  Has reduced sweets and ice cream No  significant cherry pie intake   PERTINENT  PMH / PSH/Family/Social History : reviewed   OBJECTIVE:   BP 128/78 (BP Location: Left Arm)   Pulse 76   Resp 14   SpO2 98%   Today's weight:  Review of prior weights: There were no vitals filed for this visit.  Irregular rhythm Lungs with diminished breath sounds Post inflammatory hyperpigmentation on R flank   ASSESSMENT/PLAN:   Assessment & Plan Well controlled type 2 diabetes mellitus (HCC) Due for an A1c in April.  Will request [name] draw this with her next labs next week. Patient and son determined to increase mobility and decrease weight Discussed dietary changes Will send information with high protein foods  Mucopurulent chronic bronchitis (HCC) She is now off oxygen and doing very well. Essential hypertension Blood pressure doing well.  As her Lasix was recently restarted we will recheck a BMP next week. Post-polio muscle weakness The patient has both medical and mental health conditions that require 24-hour supervision by her son or another care. Letter provided for son for new job Schizophrenia, unspecified type (HCC) Continues to have good and not as good days related to mood and memory.  She has had multiple good days in a row most recently.  She appears to be in good spirits today and her son continues to provide excellent care.  She follows with  psychiatry and is on appropriate therapies including Depakote and Zyprexa Post-inflammatory hyperpigmentation Rx kenalog    Abigail Starr, MD  Family Medicine Teaching Service  [facility]

## 2023-03-21 ENCOUNTER — Encounter: Payer: Self-pay | Admitting: Family Medicine

## 2023-03-21 ENCOUNTER — Telehealth: Payer: Self-pay | Admitting: Family Medicine

## 2023-03-21 ENCOUNTER — Other Ambulatory Visit: Payer: 59 | Admitting: Family Medicine

## 2023-03-21 VITALS — BP 128/78 | HR 76 | Resp 14

## 2023-03-21 DIAGNOSIS — Z794 Long term (current) use of insulin: Secondary | ICD-10-CM

## 2023-03-21 DIAGNOSIS — L810 Postinflammatory hyperpigmentation: Secondary | ICD-10-CM | POA: Diagnosis not present

## 2023-03-21 DIAGNOSIS — J411 Mucopurulent chronic bronchitis: Secondary | ICD-10-CM

## 2023-03-21 DIAGNOSIS — I10 Essential (primary) hypertension: Secondary | ICD-10-CM | POA: Diagnosis not present

## 2023-03-21 DIAGNOSIS — M6281 Muscle weakness (generalized): Secondary | ICD-10-CM | POA: Diagnosis not present

## 2023-03-21 DIAGNOSIS — F209 Schizophrenia, unspecified: Secondary | ICD-10-CM

## 2023-03-21 DIAGNOSIS — E119 Type 2 diabetes mellitus without complications: Secondary | ICD-10-CM

## 2023-03-21 DIAGNOSIS — B91 Sequelae of poliomyelitis: Secondary | ICD-10-CM

## 2023-03-21 MED ORDER — TRIAMCINOLONE ACETONIDE 0.5 % EX OINT: 1.0000 | TOPICAL_OINTMENT | Freq: Two times a day (BID) | CUTANEOUS | 1 refills | Status: DC

## 2023-03-21 MED ORDER — AMLODIPINE BESYLATE 5 MG PO TABS: 5.0000 mg | ORAL_TABLET | Freq: Every day | ORAL | 3 refills | Status: DC

## 2023-03-21 NOTE — Assessment & Plan Note (Addendum)
 The patient has both medical and mental health conditions that require 24-hour supervision by her son or another care. Letter provided for son for new job

## 2023-03-21 NOTE — Assessment & Plan Note (Addendum)
 Due for an A1c in April.  Will request [name] draw this with her next labs next week. Patient and son determined to increase mobility and decrease weight Discussed dietary changes Will send information with high protein foods

## 2023-03-21 NOTE — Assessment & Plan Note (Addendum)
 Continues to have good and not as good days related to mood and memory.  She has had multiple good days in a row most recently.  She appears to be in good spirits today and her son continues to provide excellent care.  She follows with psychiatry and is on appropriate therapies including Depakote and Zyprexa

## 2023-03-21 NOTE — Assessment & Plan Note (Addendum)
 Blood pressure doing well.  As her Lasix was recently restarted we will recheck a BMP next week.

## 2023-03-21 NOTE — Telephone Encounter (Signed)
 RN Team Please call [facility] - Needs CMP and A1C week of March 10   Please also mail letter in letter's tab to home

## 2023-03-21 NOTE — Assessment & Plan Note (Addendum)
 She is now off oxygen and doing very well.

## 2023-03-26 NOTE — Telephone Encounter (Signed)
 Called [facility], spoke with Kraemer and provided with verbal orders.   Letter printed and placed in outgoing mail.   Veronda Prude, RN

## 2023-03-29 ENCOUNTER — Telehealth: Payer: Self-pay

## 2023-03-29 DIAGNOSIS — Z466 Encounter for fitting and adjustment of urinary device: Secondary | ICD-10-CM | POA: Diagnosis not present

## 2023-03-29 DIAGNOSIS — I5022 Chronic systolic (congestive) heart failure: Secondary | ICD-10-CM | POA: Diagnosis not present

## 2023-03-29 DIAGNOSIS — Z0189 Encounter for other specified special examinations: Secondary | ICD-10-CM | POA: Diagnosis not present

## 2023-03-29 DIAGNOSIS — E1122 Type 2 diabetes mellitus with diabetic chronic kidney disease: Secondary | ICD-10-CM | POA: Diagnosis not present

## 2023-03-29 DIAGNOSIS — J441 Chronic obstructive pulmonary disease with (acute) exacerbation: Secondary | ICD-10-CM | POA: Diagnosis not present

## 2023-03-29 DIAGNOSIS — J9621 Acute and chronic respiratory failure with hypoxia: Secondary | ICD-10-CM | POA: Diagnosis not present

## 2023-03-29 DIAGNOSIS — I130 Hypertensive heart and chronic kidney disease with heart failure and stage 1 through stage 4 chronic kidney disease, or unspecified chronic kidney disease: Secondary | ICD-10-CM | POA: Diagnosis not present

## 2023-03-29 NOTE — Telephone Encounter (Signed)
 Patient LVM on nurse line requesting to speak with PCP.   She reports she "has white stuff coming from her cath." She reports [name] did come out today.   Patient was hard to follow on VM.   I attempted to call [name], however he did not answer.   Will forward to PCP.

## 2023-03-30 LAB — LAB REPORT - SCANNED
A1c: 6.5
EGFR: 27

## 2023-04-01 ENCOUNTER — Encounter: Payer: Self-pay | Admitting: Family Medicine

## 2023-04-01 ENCOUNTER — Ambulatory Visit (INDEPENDENT_AMBULATORY_CARE_PROVIDER_SITE_OTHER): Payer: Medicare Other

## 2023-04-01 DIAGNOSIS — I442 Atrioventricular block, complete: Secondary | ICD-10-CM | POA: Diagnosis not present

## 2023-04-01 DIAGNOSIS — N1832 Chronic kidney disease, stage 3b: Secondary | ICD-10-CM

## 2023-04-02 ENCOUNTER — Telehealth: Payer: Self-pay

## 2023-04-02 LAB — CUP PACEART REMOTE DEVICE CHECK
Battery Impedance: 1692 Ohm
Battery Remaining Longevity: 45 mo
Battery Voltage: 2.76 V
Brady Statistic RV Percent Paced: 100 %
Date Time Interrogation Session: 20250317062818
Implantable Lead Connection Status: 753985
Implantable Lead Connection Status: 753985
Implantable Lead Implant Date: 20070414
Implantable Lead Implant Date: 20070914
Implantable Lead Location: 753859
Implantable Lead Location: 753860
Implantable Lead Model: 4092
Implantable Lead Model: 5594
Implantable Pulse Generator Implant Date: 20160816
Lead Channel Impedance Value: 604 Ohm
Lead Channel Impedance Value: 67 Ohm
Lead Channel Pacing Threshold Amplitude: 1.125 V
Lead Channel Pacing Threshold Pulse Width: 0.4 ms
Lead Channel Setting Pacing Amplitude: 2.5 V
Lead Channel Setting Pacing Pulse Width: 0.4 ms
Lead Channel Setting Sensing Sensitivity: 4 mV
Zone Setting Status: 755011
Zone Setting Status: 755011

## 2023-04-02 NOTE — Telephone Encounter (Signed)
 [facility] PT with [name] calls nurse line requesting verbal orders for continued [facility] PT as follows.   1x a week for 3 weeks.   She reports the patient is making good progress with transfers.   VO order given.

## 2023-04-03 DIAGNOSIS — J811 Chronic pulmonary edema: Secondary | ICD-10-CM | POA: Diagnosis not present

## 2023-04-05 DIAGNOSIS — I5022 Chronic systolic (congestive) heart failure: Secondary | ICD-10-CM | POA: Diagnosis not present

## 2023-04-05 DIAGNOSIS — M179 Osteoarthritis of knee, unspecified: Secondary | ICD-10-CM | POA: Diagnosis not present

## 2023-04-05 DIAGNOSIS — Z466 Encounter for fitting and adjustment of urinary device: Secondary | ICD-10-CM | POA: Diagnosis not present

## 2023-04-05 DIAGNOSIS — I130 Hypertensive heart and chronic kidney disease with heart failure and stage 1 through stage 4 chronic kidney disease, or unspecified chronic kidney disease: Secondary | ICD-10-CM | POA: Diagnosis not present

## 2023-04-05 DIAGNOSIS — J441 Chronic obstructive pulmonary disease with (acute) exacerbation: Secondary | ICD-10-CM | POA: Diagnosis not present

## 2023-04-05 DIAGNOSIS — I951 Orthostatic hypotension: Secondary | ICD-10-CM | POA: Diagnosis not present

## 2023-04-05 DIAGNOSIS — J449 Chronic obstructive pulmonary disease, unspecified: Secondary | ICD-10-CM | POA: Diagnosis not present

## 2023-04-05 DIAGNOSIS — J9621 Acute and chronic respiratory failure with hypoxia: Secondary | ICD-10-CM | POA: Diagnosis not present

## 2023-04-08 ENCOUNTER — Encounter: Payer: Self-pay | Admitting: Cardiovascular Disease

## 2023-04-09 NOTE — Telephone Encounter (Signed)
 Please call to find out more information about abdominal issues---unfortunately, I have a refugee home visit today and then none until June.

## 2023-04-11 DIAGNOSIS — Z466 Encounter for fitting and adjustment of urinary device: Secondary | ICD-10-CM | POA: Diagnosis not present

## 2023-04-11 DIAGNOSIS — I5022 Chronic systolic (congestive) heart failure: Secondary | ICD-10-CM | POA: Diagnosis not present

## 2023-04-11 DIAGNOSIS — J9621 Acute and chronic respiratory failure with hypoxia: Secondary | ICD-10-CM | POA: Diagnosis not present

## 2023-04-11 DIAGNOSIS — I130 Hypertensive heart and chronic kidney disease with heart failure and stage 1 through stage 4 chronic kidney disease, or unspecified chronic kidney disease: Secondary | ICD-10-CM | POA: Diagnosis not present

## 2023-04-11 DIAGNOSIS — J441 Chronic obstructive pulmonary disease with (acute) exacerbation: Secondary | ICD-10-CM | POA: Diagnosis not present

## 2023-04-12 ENCOUNTER — Telehealth: Payer: Self-pay | Admitting: Family Medicine

## 2023-04-12 NOTE — Telephone Encounter (Signed)
 Called son. Just got new Foley. Having low grade temperatures.   RN Team- please call [facility] today (as soon as possible) and ask them to draw a urine culture today. I do not need UA but definitely need a culture.   Terisa Starr, MD  Family Medicine Teaching Service

## 2023-04-12 NOTE — Telephone Encounter (Signed)
 Discussed with son over phone

## 2023-04-12 NOTE — Telephone Encounter (Signed)
 Called [name] and spoke with [name]. Gave verbal order for PRN visit to obtain urine culture.   Veronda Prude, RN

## 2023-04-12 NOTE — Telephone Encounter (Signed)
 Received VM from [facility] at [place] regarding patient. She reports that they did not have a nurse available to obtain urine culture today.   They have scheduled patient for visit on Monday. Geraldine Contras reports that they have already spoke to son who is okay with this plan.   FYI to PCP.   Veronda Prude, RN

## 2023-04-13 NOTE — Telephone Encounter (Signed)
 Noted.

## 2023-04-15 DIAGNOSIS — E1122 Type 2 diabetes mellitus with diabetic chronic kidney disease: Secondary | ICD-10-CM | POA: Diagnosis not present

## 2023-04-15 DIAGNOSIS — Z466 Encounter for fitting and adjustment of urinary device: Secondary | ICD-10-CM | POA: Diagnosis not present

## 2023-04-15 DIAGNOSIS — J441 Chronic obstructive pulmonary disease with (acute) exacerbation: Secondary | ICD-10-CM | POA: Diagnosis not present

## 2023-04-15 MED ORDER — SODIUM BICARBONATE 650 MG PO TABS: 650.0000 mg | ORAL_TABLET | Freq: Two times a day (BID) | ORAL | 3 refills | Status: DC

## 2023-04-17 DIAGNOSIS — J441 Chronic obstructive pulmonary disease with (acute) exacerbation: Secondary | ICD-10-CM | POA: Diagnosis not present

## 2023-04-17 DIAGNOSIS — I130 Hypertensive heart and chronic kidney disease with heart failure and stage 1 through stage 4 chronic kidney disease, or unspecified chronic kidney disease: Secondary | ICD-10-CM | POA: Diagnosis not present

## 2023-04-17 DIAGNOSIS — I5022 Chronic systolic (congestive) heart failure: Secondary | ICD-10-CM | POA: Diagnosis not present

## 2023-04-17 DIAGNOSIS — Z466 Encounter for fitting and adjustment of urinary device: Secondary | ICD-10-CM | POA: Diagnosis not present

## 2023-04-17 DIAGNOSIS — J9621 Acute and chronic respiratory failure with hypoxia: Secondary | ICD-10-CM | POA: Diagnosis not present

## 2023-04-19 NOTE — Telephone Encounter (Signed)
 Noted--I have messaged son about options for urine culture treatment--growing 2 species and antibiotic options limited.  Terisa Starr, MD  Family Medicine Teaching Service

## 2023-04-19 NOTE — Telephone Encounter (Signed)
 Colleen with [employer] calls nurse line in regards to urine culture.  She reports urine culture is positive.   She reports when she saw the patient on "Monday or Tuesday) she was well appearing and normal behaviors.   She also requests recert for 1x monthly cath changes.   VO given.   Will forward to PCP for urine culture advisement.   Colleen: [phone]

## 2023-04-23 ENCOUNTER — Telehealth: Payer: Self-pay | Admitting: Cardiovascular Disease

## 2023-04-23 ENCOUNTER — Telehealth: Payer: Self-pay | Admitting: *Deleted

## 2023-04-23 NOTE — Telephone Encounter (Signed)
 Abigail Wallace called to let Abigail Wallace know that they had to call the ambulance.  Patients blood sugar was 266 but all other vitals were fine.  The EMT suggested asking Abigail Wallace if the patient should check sugar more often due to her reported symptoms of lightheaded / SOB.  Abigail Wallace is requesting a call back from Abigail Wallace to discuss. Jone Baseman, CMA

## 2023-04-23 NOTE — Telephone Encounter (Signed)
 Pt c/o Shortness Of Breath: STAT if SOB developed within the last 24 hours or pt is noticeably SOB on the phone  1. Are you currently SOB (can you hear that pt is SOB on the phone)? Yes  2. How long have you been experiencing SOB? This morning   3. Are you SOB when sitting or when up moving around? Both  4. Are you currently experiencing any other symptoms? No

## 2023-04-23 NOTE — Telephone Encounter (Signed)
 Called and discussed patient's symptoms. She is improved. CBG acceptable.  Terisa Starr, MD  Family Medicine Teaching Service

## 2023-04-23 NOTE — Telephone Encounter (Signed)
 See phone note

## 2023-04-23 NOTE — Telephone Encounter (Signed)
 Patient identification verified by 2 forms. Marilynn Rail, RN    Received call from patient  Patient states:   -she is gasping for breath this morning   -SOB on going for 2 weeks   -worsening this morning   -oxygen tank is not working well  During call patient sounded short of breath, pauses between each word  Advised patient to present to ED for evaluation  Patient agrees with plan, no further questions at this time

## 2023-04-24 ENCOUNTER — Other Ambulatory Visit: Payer: Self-pay

## 2023-04-24 DIAGNOSIS — E1165 Type 2 diabetes mellitus with hyperglycemia: Secondary | ICD-10-CM

## 2023-04-24 MED ORDER — LINAGLIPTIN 5 MG PO TABS: 5.0000 mg | ORAL_TABLET | Freq: Every day | ORAL | 3 refills | Status: DC

## 2023-04-24 MED ORDER — VITAMIN D 25 MCG (1000 UNIT) PO TABS: 1000.0000 [IU] | ORAL_TABLET | Freq: Every day | ORAL | 3 refills | Status: DC

## 2023-05-02 ENCOUNTER — Telehealth: Payer: Self-pay

## 2023-05-02 ENCOUNTER — Encounter: Payer: Self-pay | Admitting: Family Medicine

## 2023-05-02 DIAGNOSIS — J441 Chronic obstructive pulmonary disease with (acute) exacerbation: Secondary | ICD-10-CM | POA: Diagnosis not present

## 2023-05-02 DIAGNOSIS — E1122 Type 2 diabetes mellitus with diabetic chronic kidney disease: Secondary | ICD-10-CM | POA: Diagnosis not present

## 2023-05-02 DIAGNOSIS — I5022 Chronic systolic (congestive) heart failure: Secondary | ICD-10-CM | POA: Diagnosis not present

## 2023-05-02 DIAGNOSIS — I130 Hypertensive heart and chronic kidney disease with heart failure and stage 1 through stage 4 chronic kidney disease, or unspecified chronic kidney disease: Secondary | ICD-10-CM | POA: Diagnosis not present

## 2023-05-02 DIAGNOSIS — Z466 Encounter for fitting and adjustment of urinary device: Secondary | ICD-10-CM | POA: Diagnosis not present

## 2023-05-02 DIAGNOSIS — J9621 Acute and chronic respiratory failure with hypoxia: Secondary | ICD-10-CM | POA: Diagnosis not present

## 2023-05-02 NOTE — Telephone Encounter (Signed)
 Received call from [name] regarding multiple concerns with patient.   He reports that patient woke up this morning with wheezing. He states that wheezing is exacerbated with increased physical activity. He also reports that patient sleeps with the window open and is unsure if increased pollen could be exacerbating her COPD. States that O2 remains at approx 95%. He states that home health RN is coming out this afternoon to listen to her lungs. Recommended that if patient were to begin experiencing difficulty breathing or worsening in wheezing, she should be taken to the ED.  Son also reports that BP has been elevated for the last week. Ranging from 160's to 90's. Denies any symptoms associated with increased readings. He also reports that these readings are being taken on a wrist monitor. If he takes a manual BP, BP is often measuring in the 150's-60's/70's. He feels that her BP starts to increase when her metoprolol starts to wear off.  [name] asked that I send message to Dr. Bevin Bucks Dr. Rumball for further advisement/ recommendations.   Elsie Halo, RN

## 2023-05-02 NOTE — Telephone Encounter (Signed)
 [facility] RN with [name] LVM on nurse line to give report.   She reports she saw the patient early this afternoon. She reports WNL vitals. She reports no wheezing or shortness of breath. She reports clear lungs and well appearing.   Will forward to PCP.

## 2023-05-02 NOTE — Telephone Encounter (Signed)
 Noted.

## 2023-05-04 DIAGNOSIS — J811 Chronic pulmonary edema: Secondary | ICD-10-CM | POA: Diagnosis not present

## 2023-05-06 DIAGNOSIS — J9621 Acute and chronic respiratory failure with hypoxia: Secondary | ICD-10-CM | POA: Diagnosis not present

## 2023-05-06 DIAGNOSIS — Z466 Encounter for fitting and adjustment of urinary device: Secondary | ICD-10-CM | POA: Diagnosis not present

## 2023-05-06 DIAGNOSIS — J441 Chronic obstructive pulmonary disease with (acute) exacerbation: Secondary | ICD-10-CM | POA: Diagnosis not present

## 2023-05-06 DIAGNOSIS — I5022 Chronic systolic (congestive) heart failure: Secondary | ICD-10-CM | POA: Diagnosis not present

## 2023-05-06 DIAGNOSIS — I951 Orthostatic hypotension: Secondary | ICD-10-CM | POA: Diagnosis not present

## 2023-05-06 DIAGNOSIS — J449 Chronic obstructive pulmonary disease, unspecified: Secondary | ICD-10-CM | POA: Diagnosis not present

## 2023-05-06 DIAGNOSIS — M179 Osteoarthritis of knee, unspecified: Secondary | ICD-10-CM | POA: Diagnosis not present

## 2023-05-06 DIAGNOSIS — I130 Hypertensive heart and chronic kidney disease with heart failure and stage 1 through stage 4 chronic kidney disease, or unspecified chronic kidney disease: Secondary | ICD-10-CM | POA: Diagnosis not present

## 2023-05-06 DIAGNOSIS — E1122 Type 2 diabetes mellitus with diabetic chronic kidney disease: Secondary | ICD-10-CM | POA: Diagnosis not present

## 2023-05-07 ENCOUNTER — Ambulatory Visit

## 2023-05-10 ENCOUNTER — Encounter: Payer: Self-pay | Admitting: Family Medicine

## 2023-05-10 NOTE — Telephone Encounter (Signed)
 Thank you for this detail--- I can do a home visit AM of May 1 between 8-930. Please ask if this would work.   [name]--if they are able to make this work, can you add me a single slot and schedule?  Otho Blitz, MD  Family Medicine Teaching Service

## 2023-05-10 NOTE — Telephone Encounter (Signed)
 Called patient's son, Michaeleen Adler. He has several concerns that he would like provider's thoughts on.   He is asking if patient is scheduled for upcoming home visit in the near future. Advised that I am not seeing anything in her chart, however, I am not familiar with the scheduling for home visits. He reports that patient has been feeling more agitated. He states that she has not been aggressive. They have spoken with the therapist that did not have any recommendations for medication changes. States that patient is smelling non-existent smells. He also reports that patient has called the police out to the home twice this week. Son also reports concerns regarding small tumor on patient's frontal lobe. He is asking if this could be contributing to her symptoms. Denies any new neurological symptoms. He also reports that diastolic BP continues to be elevated in the low 90's. Systolic BP 140's to 150's. Normal pulse ox. He does also report that patient is refusing to wear oxygen  anymore due to it "tasting like salt."  Reports that catheter was changed earlier this week. Home health nurse also advised that lungs were clear, no increased work of breathing or wheezing was noted.  Son states that he does have work today and breaks are between [employer] or after 4:30. Advised that I would forward to provider for further advisement.  Elsie Halo, RN

## 2023-05-15 ENCOUNTER — Encounter: Payer: Self-pay | Admitting: Family Medicine

## 2023-05-15 DIAGNOSIS — Z0189 Encounter for other specified special examinations: Secondary | ICD-10-CM | POA: Diagnosis not present

## 2023-05-15 DIAGNOSIS — J411 Mucopurulent chronic bronchitis: Secondary | ICD-10-CM

## 2023-05-15 DIAGNOSIS — L304 Erythema intertrigo: Secondary | ICD-10-CM

## 2023-05-15 DIAGNOSIS — Z978 Presence of other specified devices: Secondary | ICD-10-CM

## 2023-05-15 DIAGNOSIS — Z466 Encounter for fitting and adjustment of urinary device: Secondary | ICD-10-CM | POA: Diagnosis not present

## 2023-05-15 DIAGNOSIS — B91 Sequelae of poliomyelitis: Secondary | ICD-10-CM

## 2023-05-15 DIAGNOSIS — Z993 Dependence on wheelchair: Secondary | ICD-10-CM

## 2023-05-15 DIAGNOSIS — I130 Hypertensive heart and chronic kidney disease with heart failure and stage 1 through stage 4 chronic kidney disease, or unspecified chronic kidney disease: Secondary | ICD-10-CM | POA: Diagnosis not present

## 2023-05-15 DIAGNOSIS — I480 Paroxysmal atrial fibrillation: Secondary | ICD-10-CM

## 2023-05-15 DIAGNOSIS — I5022 Chronic systolic (congestive) heart failure: Secondary | ICD-10-CM | POA: Diagnosis not present

## 2023-05-15 DIAGNOSIS — E1122 Type 2 diabetes mellitus with diabetic chronic kidney disease: Secondary | ICD-10-CM | POA: Diagnosis not present

## 2023-05-15 DIAGNOSIS — J9621 Acute and chronic respiratory failure with hypoxia: Secondary | ICD-10-CM | POA: Diagnosis not present

## 2023-05-15 DIAGNOSIS — J441 Chronic obstructive pulmonary disease with (acute) exacerbation: Secondary | ICD-10-CM | POA: Diagnosis not present

## 2023-05-15 NOTE — Telephone Encounter (Signed)
 Called [name].   Verbal orders given to Colleen for CBC and CMP this week.

## 2023-05-15 NOTE — Telephone Encounter (Signed)
 Please call [facility] and ask them to obtain CBC and CMP this week ASAP  Thank you! Otho Blitz, MD  Family Medicine Teaching Service

## 2023-05-16 ENCOUNTER — Telehealth: Payer: Self-pay | Admitting: Family Medicine

## 2023-05-16 ENCOUNTER — Other Ambulatory Visit: Admitting: Family Medicine

## 2023-05-16 ENCOUNTER — Encounter: Payer: Self-pay | Admitting: Family Medicine

## 2023-05-16 VITALS — BP 138/82 | HR 77 | Resp 14

## 2023-05-16 DIAGNOSIS — N1832 Chronic kidney disease, stage 3b: Secondary | ICD-10-CM

## 2023-05-16 DIAGNOSIS — E119 Type 2 diabetes mellitus without complications: Secondary | ICD-10-CM

## 2023-05-16 DIAGNOSIS — F209 Schizophrenia, unspecified: Secondary | ICD-10-CM | POA: Diagnosis not present

## 2023-05-16 DIAGNOSIS — J411 Mucopurulent chronic bronchitis: Secondary | ICD-10-CM | POA: Diagnosis not present

## 2023-05-16 DIAGNOSIS — Z7984 Long term (current) use of oral hypoglycemic drugs: Secondary | ICD-10-CM

## 2023-05-16 DIAGNOSIS — K5904 Chronic idiopathic constipation: Secondary | ICD-10-CM | POA: Diagnosis not present

## 2023-05-16 DIAGNOSIS — I480 Paroxysmal atrial fibrillation: Secondary | ICD-10-CM

## 2023-05-16 DIAGNOSIS — B91 Sequelae of poliomyelitis: Secondary | ICD-10-CM

## 2023-05-16 DIAGNOSIS — M6281 Muscle weakness (generalized): Secondary | ICD-10-CM | POA: Diagnosis not present

## 2023-05-16 LAB — LAB REPORT - SCANNED: EGFR: 19

## 2023-05-16 MED ORDER — SENNA 8.6 MG PO TABS: 1.0000 | ORAL_TABLET | Freq: Every day | ORAL | 3 refills | Status: DC | PRN

## 2023-05-16 NOTE — Progress Notes (Addendum)
 SUBJECTIVE:   CHIEF COMPLAINT: mood change, worries about home  HPI:   Abigail Wallace is a 78 y.o.  with history notable for mood disorder on anti-psychotics (long standing), CKD IIIB, type 2 DM, atrial fibrillation on Eliquis , and COPD (previously on home oxygen ) presenting for mood change. .  Her son is present for this home visit.   Mood This week has been very challenging for the patient's mood and anxiety.  For the past 2 to 3 weeks she has constantly been smelling smoke.  This morning for example she complains of cannabis odor in her house there is none as validated by the home health nurse myself and her son. She has called the fire department 3 times due to the smell of smoke 1 time 3 fire trucks came.  She is taking her Zyprexa  and Depakote  as prescribed.  Her mood is actually improved over the last 24 hours and her behaviors also improved.  She is not actively hurting herself.  She does not act upon any length.  She becomes anxious and upset and feels she smelled smoke.  This is increased in frequency for the past 3 weeks.  There is an unknown trigger.  Blood pressure At goal this morning she is doing well and taking amlodipine  5 mg and metoprolol  100 three times a day  Chronic bronchitis  The patient has a history significant for chronic obstructive pulmonary disease.  She is intermittently required home oxygen  in the past.  Due to her significant lung disease she cannot use albuterol  regularly and requires nebulizers throughout the day.  She feels well today no fevers or difficulty breathing.   Other Issues   The patient has a history of urinary retention.  Due to this and immobility she has an indwelling Foley catheter.  This was placed on April 21.  It will be replaced in mid May.  She has no symptoms of urinary tract infection today.  She has some concerns her pacemaker has moved.  She is now feeling it more on the left side.  PERTINENT  PMH / PSH/Family/Social History  :  Postpolio syndrome, significant degenerative disc disease, likely vascular dementia as she is bedbound. Indwelling Foley catheter Schizophrenia followed by psychiatry Type 2 diabetes that is well-controlled last A1c of 6.5 Chronic kidney disease stage IIIb  OBJECTIVE:   BP 138/82 (Cuff Size: Large)   Pulse 77   Resp 14   SpO2 99%   Today's weight:  Review of prior weights: There were no vitals filed for this visit.  Pleasant appearing woman in no distress.  She is in good spirits this morning throughout my visit she does intermittently report acannabis odor that she senses.  This is not appreciated by the physician or her son or the RN present Irregular rate and rhythm.  Pacemaker palpated there is no overlying erythema there is no pain she does have some atrophy of the pectoralis on the left side as compared to the right. Feet and skin exam---impeccably kept and there is no ulcerations or skin breakdown. ASSESSMENT/PLAN:   Assessment & Plan Post-polio muscle weakness Due to this the patient is essentially bedbound and would be unable to be transported to guardianship assessment.   Need for New Mattress and Overlay Due to the patient's indwelling Foley catheter, postpolio some syndrome and other conditions that render her largely immobile she is at very high risk for skin breakdown.  As such she requires a specialized mattress to reduce the risk  of skin maceration and skin ulcers.  Additionally she requires a hospital bed that can be elevated given her history of heart failure with midrange ejection fraction which is currently well-controlled with furosemide . Schizophrenia, unspecified type (HCC) Currently uncontrolled.  She has a visit with her psychiatrist later today.  We may need to have a as needed medication available or slight increase in Zyprexa .  Her memory actually seems very much intact and she is doing well today. Well controlled type 2 diabetes mellitus (HCC) A1c is  due in late June Paroxysmal atrial fibrillation [facility]) s/p cardioversion  Discussed battery life and recommendations about pacemaker feels appropriate on my exam no concern for displacement or dislodging of the leads  CBC today as she is on anticoagulation. CKD stage IIIb BMP today. Mucopurulent chronic bronchitis (HCC) Doing well but does require a refill of her nebulizer mouthpiece and tubing.  This has been ordered. Chronic idiopathic constipation Senna sent    Otho Blitz, MD  Family Medicine Teaching Service  [facility]

## 2023-05-16 NOTE — Assessment & Plan Note (Addendum)
 Due to this the patient is essentially bedbound and would be unable to be transported to guardianship assessment.   Need for New Mattress and Overlay Due to the patient's indwelling Foley catheter, postpolio some syndrome and other conditions that render her largely immobile she is at very high risk for skin breakdown.  As such she requires a specialized mattress to reduce the risk of skin maceration and skin ulcers.  Additionally she requires a hospital bed that can be elevated given her history of heart failure with midrange ejection fraction which is currently well-controlled with furosemide .

## 2023-05-16 NOTE — Assessment & Plan Note (Signed)
 Doing well but does require a refill of her nebulizer mouthpiece and tubing.  This has been ordered.

## 2023-05-16 NOTE — Telephone Encounter (Signed)
 Patient has need for DME. I have ordered  a special mattress and overly . I am routing note for [facility] RN Pool.   Azell Boll, MD

## 2023-05-16 NOTE — Assessment & Plan Note (Signed)
 Discussed battery life and recommendations about pacemaker feels appropriate on my exam no concern for displacement or dislodging of the leads  CBC today as she is on anticoagulation.

## 2023-05-16 NOTE — Assessment & Plan Note (Signed)
 A1c is due in late June

## 2023-05-16 NOTE — Assessment & Plan Note (Signed)
 BMP today

## 2023-05-16 NOTE — Telephone Encounter (Signed)
 Patient has need for DME. I have ordered  nebulizer materials . I am routing note for [facility] RN Pool.   Azell Boll, MD

## 2023-05-16 NOTE — Telephone Encounter (Signed)
 Community message has been sent to [employer] for nebulizer supplies and speciality mattress.   Will await response.   Elsie Halo, RN

## 2023-05-16 NOTE — Assessment & Plan Note (Signed)
 Currently uncontrolled.  She has a visit with her psychiatrist later today.  We may need to have a as needed medication available or slight increase in Zyprexa .  Her memory actually seems very much intact and she is doing well today.

## 2023-05-17 ENCOUNTER — Other Ambulatory Visit: Payer: Self-pay | Admitting: Cardiovascular Disease

## 2023-05-17 DIAGNOSIS — J441 Chronic obstructive pulmonary disease with (acute) exacerbation: Secondary | ICD-10-CM | POA: Diagnosis not present

## 2023-05-17 DIAGNOSIS — D329 Benign neoplasm of meninges, unspecified: Secondary | ICD-10-CM | POA: Diagnosis not present

## 2023-05-17 DIAGNOSIS — J411 Mucopurulent chronic bronchitis: Secondary | ICD-10-CM | POA: Diagnosis not present

## 2023-05-17 NOTE — Telephone Encounter (Signed)
 Receipt confirmed by [employer].   Veronda Prude, RN

## 2023-05-20 MED ORDER — TRELEGY ELLIPTA 100-62.5-25 MCG/ACT IN AEPB: 1.0000 | INHALATION_SPRAY | Freq: Every day | RESPIRATORY_TRACT | 3 refills | Status: DC

## 2023-05-20 NOTE — Addendum Note (Signed)
 Addended by: Luciana Ruth on: 05/20/2023 12:52 PM   Modules accepted: Orders

## 2023-05-21 DIAGNOSIS — R338 Other retention of urine: Secondary | ICD-10-CM | POA: Diagnosis not present

## 2023-05-22 NOTE — Addendum Note (Signed)
 Addended by: Edra Govern D on: 05/22/2023 12:22 PM   Modules accepted: Orders

## 2023-05-22 NOTE — Progress Notes (Signed)
 Remote pacemaker transmission.

## 2023-05-24 ENCOUNTER — Encounter: Payer: Self-pay | Admitting: Family Medicine

## 2023-05-24 DIAGNOSIS — M1A9XX0 Chronic gout, unspecified, without tophus (tophi): Secondary | ICD-10-CM

## 2023-05-24 DIAGNOSIS — I480 Paroxysmal atrial fibrillation: Secondary | ICD-10-CM

## 2023-05-24 DIAGNOSIS — B91 Sequelae of poliomyelitis: Secondary | ICD-10-CM

## 2023-05-30 ENCOUNTER — Telehealth: Payer: Self-pay | Admitting: Family Medicine

## 2023-05-30 NOTE — Telephone Encounter (Signed)
 Patient has need for DME. I have ordered new mattress. I am routing note for [facility] RN Pool.   Azell Boll, MD

## 2023-05-31 NOTE — Addendum Note (Signed)
 Addended by: Bevin Bucks, Dontrez Pettis on: 05/31/2023 01:21 PM   Modules accepted: Orders

## 2023-05-31 NOTE — Telephone Encounter (Signed)
 Sent community message to [employer] with updated order information.   Elsie Halo, RN

## 2023-05-31 NOTE — Telephone Encounter (Signed)
Community message sent to [employer] for processing.   Will await response.  

## 2023-06-03 DIAGNOSIS — J811 Chronic pulmonary edema: Secondary | ICD-10-CM | POA: Diagnosis not present

## 2023-06-03 NOTE — Telephone Encounter (Signed)
 Receipt confirmed by [employer].   Veronda Prude, RN

## 2023-06-04 ENCOUNTER — Telehealth: Payer: Self-pay

## 2023-06-04 DIAGNOSIS — Z466 Encounter for fitting and adjustment of urinary device: Secondary | ICD-10-CM | POA: Diagnosis not present

## 2023-06-04 DIAGNOSIS — E1122 Type 2 diabetes mellitus with diabetic chronic kidney disease: Secondary | ICD-10-CM | POA: Diagnosis not present

## 2023-06-04 DIAGNOSIS — I5022 Chronic systolic (congestive) heart failure: Secondary | ICD-10-CM | POA: Diagnosis not present

## 2023-06-04 DIAGNOSIS — M6281 Muscle weakness (generalized): Secondary | ICD-10-CM | POA: Diagnosis not present

## 2023-06-04 DIAGNOSIS — I130 Hypertensive heart and chronic kidney disease with heart failure and stage 1 through stage 4 chronic kidney disease, or unspecified chronic kidney disease: Secondary | ICD-10-CM | POA: Diagnosis not present

## 2023-06-04 DIAGNOSIS — M179 Osteoarthritis of knee, unspecified: Secondary | ICD-10-CM | POA: Diagnosis not present

## 2023-06-04 DIAGNOSIS — I951 Orthostatic hypotension: Secondary | ICD-10-CM | POA: Diagnosis not present

## 2023-06-04 DIAGNOSIS — J9621 Acute and chronic respiratory failure with hypoxia: Secondary | ICD-10-CM | POA: Diagnosis not present

## 2023-06-04 DIAGNOSIS — J449 Chronic obstructive pulmonary disease, unspecified: Secondary | ICD-10-CM | POA: Diagnosis not present

## 2023-06-04 DIAGNOSIS — J441 Chronic obstructive pulmonary disease with (acute) exacerbation: Secondary | ICD-10-CM | POA: Diagnosis not present

## 2023-06-04 NOTE — Telephone Encounter (Signed)
 Attempted to contact Sabrina, however no answer.   Detailed VM left informing of need of urine culture.

## 2023-06-04 NOTE — Telephone Encounter (Signed)
 RN Team please call [name] and ask for the following next week with Foley change - Urine culture (off new Foley) - CBC with differential  - CMP

## 2023-06-04 NOTE — Telephone Encounter (Signed)
 Abigail Wallace [facility] RN with [name] calls nurse line requesting VO for UA.   She reports she is at the home now for cath change.   She reports the urine is dark and patient is requesting UA.   Verbal order given.

## 2023-06-04 NOTE — Telephone Encounter (Signed)
 Please call her and ensure a culture is also sent--see additional note from mychart this AM

## 2023-06-05 DIAGNOSIS — J449 Chronic obstructive pulmonary disease, unspecified: Secondary | ICD-10-CM | POA: Diagnosis not present

## 2023-06-05 DIAGNOSIS — M179 Osteoarthritis of knee, unspecified: Secondary | ICD-10-CM | POA: Diagnosis not present

## 2023-06-05 DIAGNOSIS — I951 Orthostatic hypotension: Secondary | ICD-10-CM | POA: Diagnosis not present

## 2023-06-05 NOTE — Telephone Encounter (Signed)
 Noted and agree. Total body 'spells' unlikely seizure or stroke. Monitor for unilateral symptoms or objective findings.  Otho Blitz, MD  Family Medicine Teaching Service

## 2023-06-05 NOTE — Telephone Encounter (Signed)
 Called patient and discussed symptoms.   She reports that she has been having episodes of  "going numb all over body." She reports that when she has this episodes, she feels like she is unable to move at all.   She states that she is also having intermittent episodes of dizziness. Patient reports that these symptoms have been present for about one month.   Denies unilateral weakness/numbness, no facial drooping or slurring of speech. She does reports hesitation of speech that has also been present "for a while now."  Spoke with Dr. Bevin Bucks regarding patient. Advised that we call [name] and add a B12 level to her labs next week and ask that nursing staff perform neuro checks and let us  know if there are any changes.   Advised patient that labs have been added. Discussed reasons for urgent evaluation in the ED. She voices understanding.   Called [name] and provided verbal orders for labs (CBC with diff, CMP and B12) and neuro checks. Urine was collected yesterday, attempted to verify that they did UA with culture. [name] advised that she would need to speak with RN that saw patient yesterday as she is only able to see that urine was collected. They will call us  back with any abnormal findings.   Elsie Halo, RN

## 2023-06-11 ENCOUNTER — Telehealth

## 2023-06-11 NOTE — Telephone Encounter (Signed)
 Called and spoke with Abigail Wallace at [facility]. She reports that recent urine culture reports were faxed to Urology office.   She is going to re-fax all recent reports to our office.   Elsie Halo, RN

## 2023-06-11 NOTE — Telephone Encounter (Signed)
 The results I was sent show mixed flora, no indication for treatment. Please ask them to fax any other results.  Otho Blitz, MD  Family Medicine Teaching Service

## 2023-06-11 NOTE — Telephone Encounter (Signed)
 Colleen with [name] calls to nurse line to give results.   She reports the patients urine culture was positive from last week.   She reports she is unsure of organism growth or sensitivity panel.   Will forward to PCP for treatment.

## 2023-06-12 NOTE — Telephone Encounter (Signed)
 Called and discussed--- Cr was from April, repeating on Friday

## 2023-06-12 NOTE — Telephone Encounter (Signed)
 Called and discussed

## 2023-06-12 NOTE — Telephone Encounter (Signed)
 Called and spoke with [name].   She reports they have orders to visit patients home tomorrow 5/29. She reports orders for B12, CBC with Diff and CMP.   Discussed with [name], she reports we can move 5/29 visit to 5/30 with above orders adding BMP.

## 2023-06-12 NOTE — Telephone Encounter (Signed)
 Called son with results. Creatinine elevated. Hold Lasix  two days.  RN Team- please call [name] and ask them to repeat basic metabolic panel on 06/14/23.  Otho Blitz, MD  Family Medicine Teaching Service

## 2023-06-12 NOTE — Telephone Encounter (Signed)
 Noted and agree.

## 2023-06-13 DIAGNOSIS — J441 Chronic obstructive pulmonary disease with (acute) exacerbation: Secondary | ICD-10-CM | POA: Diagnosis not present

## 2023-06-13 DIAGNOSIS — I130 Hypertensive heart and chronic kidney disease with heart failure and stage 1 through stage 4 chronic kidney disease, or unspecified chronic kidney disease: Secondary | ICD-10-CM | POA: Diagnosis not present

## 2023-06-13 DIAGNOSIS — I5022 Chronic systolic (congestive) heart failure: Secondary | ICD-10-CM | POA: Diagnosis not present

## 2023-06-13 DIAGNOSIS — Z466 Encounter for fitting and adjustment of urinary device: Secondary | ICD-10-CM | POA: Diagnosis not present

## 2023-06-13 DIAGNOSIS — J9621 Acute and chronic respiratory failure with hypoxia: Secondary | ICD-10-CM | POA: Diagnosis not present

## 2023-06-13 DIAGNOSIS — E1122 Type 2 diabetes mellitus with diabetic chronic kidney disease: Secondary | ICD-10-CM | POA: Diagnosis not present

## 2023-06-13 NOTE — Telephone Encounter (Signed)
 Received call from Colleen. She reports that they were able to have home visit today and was able to obtain labs at this visit.   They will fax our office the lab report once available.   Elsie Halo, RN

## 2023-06-14 ENCOUNTER — Emergency Department (HOSPITAL_COMMUNITY)

## 2023-06-14 ENCOUNTER — Telehealth: Payer: Self-pay | Admitting: Family Medicine

## 2023-06-14 ENCOUNTER — Inpatient Hospital Stay (HOSPITAL_COMMUNITY)
Admission: EM | Admit: 2023-06-14 | Discharge: 2023-06-19 | DRG: 291 | Disposition: A | Discharge status: Home / Self Care | Attending: Family Medicine | Admitting: Family Medicine

## 2023-06-14 ENCOUNTER — Encounter: Payer: Self-pay | Admitting: Family Medicine

## 2023-06-14 ENCOUNTER — Encounter (HOSPITAL_COMMUNITY): Payer: Self-pay

## 2023-06-14 ENCOUNTER — Ambulatory Visit: Payer: Self-pay | Admitting: Family Medicine

## 2023-06-14 ENCOUNTER — Other Ambulatory Visit: Payer: Self-pay

## 2023-06-14 DIAGNOSIS — I5043 Acute on chronic combined systolic (congestive) and diastolic (congestive) heart failure: Secondary | ICD-10-CM | POA: Diagnosis present

## 2023-06-14 DIAGNOSIS — N189 Chronic kidney disease, unspecified: Secondary | ICD-10-CM

## 2023-06-14 DIAGNOSIS — I495 Sick sinus syndrome: Secondary | ICD-10-CM | POA: Diagnosis present

## 2023-06-14 DIAGNOSIS — N179 Acute kidney failure, unspecified: Secondary | ICD-10-CM | POA: Diagnosis present

## 2023-06-14 DIAGNOSIS — B961 Klebsiella pneumoniae [K. pneumoniae] as the cause of diseases classified elsewhere: Secondary | ICD-10-CM | POA: Diagnosis present

## 2023-06-14 DIAGNOSIS — I509 Heart failure, unspecified: Principal | ICD-10-CM

## 2023-06-14 DIAGNOSIS — Z885 Allergy status to narcotic agent status: Secondary | ICD-10-CM

## 2023-06-14 DIAGNOSIS — R69 Illness, unspecified: Secondary | ICD-10-CM | POA: Diagnosis not present

## 2023-06-14 DIAGNOSIS — F05 Delirium due to known physiological condition: Secondary | ICD-10-CM | POA: Diagnosis present

## 2023-06-14 DIAGNOSIS — R0689 Other abnormalities of breathing: Secondary | ICD-10-CM | POA: Diagnosis not present

## 2023-06-14 DIAGNOSIS — I130 Hypertensive heart and chronic kidney disease with heart failure and stage 1 through stage 4 chronic kidney disease, or unspecified chronic kidney disease: Principal | ICD-10-CM | POA: Diagnosis present

## 2023-06-14 DIAGNOSIS — Z7401 Bed confinement status: Secondary | ICD-10-CM

## 2023-06-14 DIAGNOSIS — Z7901 Long term (current) use of anticoagulants: Secondary | ICD-10-CM

## 2023-06-14 DIAGNOSIS — Z6841 Body Mass Index (BMI) 40.0 and over, adult: Secondary | ICD-10-CM

## 2023-06-14 DIAGNOSIS — G14 Postpolio syndrome: Secondary | ICD-10-CM | POA: Diagnosis present

## 2023-06-14 DIAGNOSIS — Z91199 Patient's noncompliance with other medical treatment and regimen due to unspecified reason: Secondary | ICD-10-CM

## 2023-06-14 DIAGNOSIS — G479 Sleep disorder, unspecified: Secondary | ICD-10-CM | POA: Diagnosis present

## 2023-06-14 DIAGNOSIS — R0600 Dyspnea, unspecified: Secondary | ICD-10-CM | POA: Diagnosis present

## 2023-06-14 DIAGNOSIS — N1832 Chronic kidney disease, stage 3b: Secondary | ICD-10-CM | POA: Diagnosis present

## 2023-06-14 DIAGNOSIS — R531 Weakness: Secondary | ICD-10-CM | POA: Diagnosis not present

## 2023-06-14 DIAGNOSIS — Z950 Presence of cardiac pacemaker: Secondary | ICD-10-CM

## 2023-06-14 DIAGNOSIS — M109 Gout, unspecified: Secondary | ICD-10-CM | POA: Diagnosis present

## 2023-06-14 DIAGNOSIS — Z888 Allergy status to other drugs, medicaments and biological substances status: Secondary | ICD-10-CM

## 2023-06-14 DIAGNOSIS — E871 Hypo-osmolality and hyponatremia: Secondary | ICD-10-CM | POA: Diagnosis present

## 2023-06-14 DIAGNOSIS — J449 Chronic obstructive pulmonary disease, unspecified: Secondary | ICD-10-CM

## 2023-06-14 DIAGNOSIS — Z7984 Long term (current) use of oral hypoglycemic drugs: Secondary | ICD-10-CM | POA: Diagnosis not present

## 2023-06-14 DIAGNOSIS — E669 Obesity, unspecified: Secondary | ICD-10-CM | POA: Diagnosis present

## 2023-06-14 DIAGNOSIS — I5021 Acute systolic (congestive) heart failure: Secondary | ICD-10-CM | POA: Diagnosis not present

## 2023-06-14 DIAGNOSIS — T83518A Infection and inflammatory reaction due to other urinary catheter, initial encounter: Secondary | ICD-10-CM | POA: Diagnosis present

## 2023-06-14 DIAGNOSIS — Z833 Family history of diabetes mellitus: Secondary | ICD-10-CM

## 2023-06-14 DIAGNOSIS — R911 Solitary pulmonary nodule: Secondary | ICD-10-CM | POA: Diagnosis not present

## 2023-06-14 DIAGNOSIS — Z87891 Personal history of nicotine dependence: Secondary | ICD-10-CM

## 2023-06-14 DIAGNOSIS — J90 Pleural effusion, not elsewhere classified: Secondary | ICD-10-CM | POA: Diagnosis not present

## 2023-06-14 DIAGNOSIS — Z993 Dependence on wheelchair: Secondary | ICD-10-CM

## 2023-06-14 DIAGNOSIS — Z823 Family history of stroke: Secondary | ICD-10-CM

## 2023-06-14 DIAGNOSIS — Y738 Miscellaneous gastroenterology and urology devices associated with adverse incidents, not elsewhere classified: Secondary | ICD-10-CM | POA: Diagnosis present

## 2023-06-14 DIAGNOSIS — R6889 Other general symptoms and signs: Secondary | ICD-10-CM | POA: Diagnosis not present

## 2023-06-14 DIAGNOSIS — J440 Chronic obstructive pulmonary disease with acute lower respiratory infection: Secondary | ICD-10-CM | POA: Diagnosis present

## 2023-06-14 DIAGNOSIS — Z79899 Other long term (current) drug therapy: Secondary | ICD-10-CM

## 2023-06-14 DIAGNOSIS — E785 Hyperlipidemia, unspecified: Secondary | ICD-10-CM | POA: Diagnosis present

## 2023-06-14 DIAGNOSIS — I517 Cardiomegaly: Secondary | ICD-10-CM | POA: Diagnosis not present

## 2023-06-14 DIAGNOSIS — R0902 Hypoxemia: Secondary | ICD-10-CM | POA: Diagnosis present

## 2023-06-14 DIAGNOSIS — J189 Pneumonia, unspecified organism: Secondary | ICD-10-CM | POA: Diagnosis present

## 2023-06-14 DIAGNOSIS — F200 Paranoid schizophrenia: Secondary | ICD-10-CM | POA: Diagnosis present

## 2023-06-14 DIAGNOSIS — Z8249 Family history of ischemic heart disease and other diseases of the circulatory system: Secondary | ICD-10-CM

## 2023-06-14 DIAGNOSIS — Z789 Other specified health status: Secondary | ICD-10-CM

## 2023-06-14 DIAGNOSIS — R918 Other nonspecific abnormal finding of lung field: Secondary | ICD-10-CM | POA: Diagnosis not present

## 2023-06-14 DIAGNOSIS — E1122 Type 2 diabetes mellitus with diabetic chronic kidney disease: Secondary | ICD-10-CM | POA: Diagnosis present

## 2023-06-14 DIAGNOSIS — R0989 Other specified symptoms and signs involving the circulatory and respiratory systems: Secondary | ICD-10-CM | POA: Diagnosis not present

## 2023-06-14 HISTORY — DX: Paranoid schizophrenia: F20.0

## 2023-06-14 LAB — URINALYSIS, ROUTINE W REFLEX MICROSCOPIC
Bilirubin Urine: NEGATIVE
Glucose, UA: >=500 mg/dL — AB
Hgb urine dipstick: NEGATIVE
Ketones, ur: NEGATIVE mg/dL
Nitrite: NEGATIVE
Protein, ur: 100 mg/dL — AB
Specific Gravity, Urine: 1.002 — ABNORMAL LOW (ref 1.005–1.030)
pH: 7 (ref 5.0–8.0)

## 2023-06-14 LAB — CBC WITH DIFFERENTIAL/PLATELET
Abs Immature Granulocytes: 0.33 10*3/uL — ABNORMAL HIGH (ref 0.00–0.07)
Basophils Absolute: 0.1 10*3/uL (ref 0.0–0.1)
Basophils Relative: 1 %
Eosinophils Absolute: 0.2 10*3/uL (ref 0.0–0.5)
Eosinophils Relative: 2 %
HCT: 32.5 % — ABNORMAL LOW (ref 36.0–46.0)
Hemoglobin: 10.7 g/dL — ABNORMAL LOW (ref 12.0–15.0)
Immature Granulocytes: 3 %
Lymphocytes Relative: 19 %
Lymphs Abs: 2.2 10*3/uL (ref 0.7–4.0)
MCH: 30 pg (ref 26.0–34.0)
MCHC: 32.9 g/dL (ref 30.0–36.0)
MCV: 91 fL (ref 80.0–100.0)
Monocytes Absolute: 0.7 10*3/uL (ref 0.1–1.0)
Monocytes Relative: 6 %
Neutro Abs: 8 10*3/uL — ABNORMAL HIGH (ref 1.7–7.7)
Neutrophils Relative %: 69 %
Platelets: 199 10*3/uL (ref 150–400)
RBC: 3.57 MIL/uL — ABNORMAL LOW (ref 3.87–5.11)
RDW: 14.9 % (ref 11.5–15.5)
WBC: 11.5 10*3/uL — ABNORMAL HIGH (ref 4.0–10.5)
nRBC: 0 % (ref 0.0–0.2)

## 2023-06-14 LAB — BASIC METABOLIC PANEL WITH GFR
Anion gap: 16 — ABNORMAL HIGH (ref 5–15)
BUN: 41 mg/dL — ABNORMAL HIGH (ref 8–23)
CO2: 20 mmol/L — ABNORMAL LOW (ref 22–32)
Calcium: 10 mg/dL (ref 8.9–10.3)
Chloride: 99 mmol/L (ref 98–111)
Creatinine, Ser: 2.65 mg/dL — ABNORMAL HIGH (ref 0.44–1.00)
GFR, Estimated: 18 mL/min — ABNORMAL LOW (ref >60)
Glucose, Bld: 190 mg/dL — ABNORMAL HIGH (ref 70–99)
Potassium: 4.4 mmol/L (ref 3.5–5.1)
Sodium: 135 mmol/L (ref 135–145)

## 2023-06-14 LAB — I-STAT CHEM 8, ED
BUN: 42 mg/dL — ABNORMAL HIGH (ref 8–23)
Calcium, Ion: 1.26 mmol/L (ref 1.15–1.40)
Chloride: 103 mmol/L (ref 98–111)
Creatinine, Ser: 2.8 mg/dL — ABNORMAL HIGH (ref 0.44–1.00)
Glucose, Bld: 186 mg/dL — ABNORMAL HIGH (ref 70–99)
HCT: 32 % — ABNORMAL LOW (ref 36.0–46.0)
Hemoglobin: 10.9 g/dL — ABNORMAL LOW (ref 12.0–15.0)
Potassium: 4.4 mmol/L (ref 3.5–5.1)
Sodium: 135 mmol/L (ref 135–145)
TCO2: 21 mmol/L — ABNORMAL LOW (ref 22–32)

## 2023-06-14 LAB — TROPONIN I (HIGH SENSITIVITY)
Troponin I (High Sensitivity): 18 ng/L — ABNORMAL HIGH (ref <18)
Troponin I (High Sensitivity): 19 ng/L — ABNORMAL HIGH (ref <18)

## 2023-06-14 LAB — LAB REPORT - SCANNED: EGFR: 22

## 2023-06-14 LAB — BRAIN NATRIURETIC PEPTIDE: B Natriuretic Peptide: 549.4 pg/mL — ABNORMAL HIGH (ref 0.0–100.0)

## 2023-06-14 MED ORDER — FUROSEMIDE 10 MG/ML IJ SOLN
40.0000 mg | Freq: Once | INTRAMUSCULAR | Status: AC
  Administered 2023-06-14: 40 mg via INTRAVENOUS
  Filled 2023-06-14: qty 4

## 2023-06-14 MED ORDER — LEVOFLOXACIN IN D5W 750 MG/150ML IV SOLN
750.0000 mg | Freq: Once | INTRAVENOUS | Status: AC
  Administered 2023-06-14: 750 mg via INTRAVENOUS
  Filled 2023-06-14: qty 150

## 2023-06-14 MED ORDER — ENOXAPARIN SODIUM 30 MG/0.3ML IJ SOSY: 30.0000 mg | PREFILLED_SYRINGE | Freq: Every day | INTRAMUSCULAR | Status: DC

## 2023-06-14 MED ORDER — IPRATROPIUM-ALBUTEROL 0.5-2.5 (3) MG/3ML IN SOLN
3.0000 mL | Freq: Once | RESPIRATORY_TRACT | Status: AC
  Administered 2023-06-14: 3 mL via RESPIRATORY_TRACT
  Filled 2023-06-14: qty 3

## 2023-06-14 MED ORDER — ACETAMINOPHEN 325 MG PO TABS
650.0000 mg | ORAL_TABLET | Freq: Four times a day (QID) | ORAL | Status: DC | PRN
  Administered 2023-06-15 – 2023-06-19 (×4): 650 mg via ORAL
  Filled 2023-06-14 (×4): qty 2

## 2023-06-14 MED ORDER — FUROSEMIDE 40 MG PO TABS: 40.0000 mg | ORAL_TABLET | Freq: Every day | ORAL | 3 refills | Status: DC

## 2023-06-14 MED ORDER — ACETAMINOPHEN 650 MG RE SUPP: 650.0000 mg | Freq: Four times a day (QID) | RECTAL | Status: DC | PRN

## 2023-06-14 NOTE — Telephone Encounter (Signed)
Noted.   Josceline Chenard, MD  Family Medicine Teaching Service   

## 2023-06-14 NOTE — ED Notes (Signed)
 Lab to add on BNP and first Troponin to previously collected labs.

## 2023-06-14 NOTE — ED Triage Notes (Signed)
 Pt presents to ED via EMS from home for abnormal labs. Renal function abnormal according to family. Pt has chronic foley catheter. Alert and oriented x4.

## 2023-06-14 NOTE — Telephone Encounter (Signed)
 Called with results. Has had dyspnea especially at night, BNP rising as is creatinine. Recommended ED evaluation given limitations of home exam.    Patient does not want ED evaluation at this time. Son will discuss.  RN Team Please call Abigail Wallace and ask to obtain BMP June 17, 2023.   Otho Blitz, MD  Family Medicine Teaching Service

## 2023-06-14 NOTE — ED Provider Notes (Signed)
 [place] EMERGENCY DEPARTMENT AT [facility] Provider Note  CSN: [id number] Arrival date & time: 06/14/23 1450  Chief Complaint(s) No chief complaint on file.  HPI Abigail Wallace is a 78 y.o. female with past medical history as below, significant for A-fib, CKD, CHF, COPD, paranoid schizophrenia, type II DM who presents to the ED with complaint of abnormal labs  She was directed to come to the emergency department secondary to abnormal renal function/elev BNP on outpatient labs  Here with son who is primary caretaker, reports increased wob last few days, unsure about weight gain as pt non ambulatory and do not have bed scale. Peripheral edema has worsened per son. Tolerating her regular medications. Has hx paranoid schizophrenia and sundowning and has increased agitation at night time and concurrently worsening dib, she has orthopnea, worsened.   Past Medical History Past Medical History:  Diagnosis Date   Abdominal wall hernia 01/2017   Advanced care planning/counseling discussion 09/29/2021   Atrial fibrillation (HCC)    CHF (congestive heart failure) (HCC)    CKD (chronic kidney disease)    COPD (chronic obstructive pulmonary disease) (HCC)    Coronary artery disease    NON-CRITICAL   Ectasis aorta (HCC)    GERD (gastroesophageal reflux disease)    Gout    Hypertension    Memory difficulties 12/02/2013   Pacemaker    Paranoid schizophrenia (HCC)    Psychosis (HCC)    Tardive dyskinesia 04/26/2011   Type 2 diabetes mellitus (HCC)    Patient Active Problem List   Diagnosis Date Noted   Chronic health problem 06/14/2023   CHF (congestive heart failure) (HCC) 06/14/2023   AKI (acute kidney injury) (HCC) 02/11/2023   CHF exacerbation (HCC) 10/12/2022   Meningioma (HCC) 04/06/2022   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   Cholesteatoma 12/24/2021   Advanced care planning/counseling discussion 09/29/2021   Chronic back pain 11/14/2018   Pulmonary nodules  03/03/2018   Paroxysmal atrial fibrillation (HCC) s/p cardioversion  01/28/2018   Dependence on wheelchair 05/17/2017   Abdominal aortic ectasia (HCC) 01/16/2017   Renal cyst 01/16/2017   CKD stage IIIb 09/12/2016   Dyspnea 04/15/2015   Well controlled type 2 diabetes mellitus (HCC) 03/20/2012   Gout 09/26/2011   Senile cataracts of both eyes 05/03/2011   Essential hypertension 04/26/2011   Tardive dyskinesia 04/26/2011   Cardiac pacemaker in situ 03/30/2010   Vaginal prolapse 03/27/2010   Obstructive sleep apnea 03/27/2010   Osteoarthritis of right knee 03/27/2010   Resting tremor 12/15/2009   Sick sinus syndrome (HCC) 04/24/2006   Schizophrenia (HCC) 03/14/2006   Chronic obstructive pulmonary disease (HCC) 03/14/2006   Home Medication(s) Prior to Admission medications   Medication Sig Start Date End Date Taking? Authorizing Provider  allopurinol  (ZYLOPRIM ) 100 MG tablet Take 1 tablet (100 mg total) by mouth daily. 04/16/22  Yes Azell Boll, MD  amLODipine  (NORVASC ) 5 MG tablet Take 1 tablet (5 mg total) by mouth at bedtime. 03/21/23  Yes Azell Boll, MD  apixaban  (ELIQUIS ) 2.5 MG TABS tablet Take 1 tablet (2.5 mg total) by mouth 2 (two) times daily. 12/11/22  Yes Azell Boll, MD  cholecalciferol  (VITAMIN D3) 25 MCG (1000 UNIT) tablet Take 1 tablet (1,000 Units total) by mouth daily. 04/24/23  Yes Azell Boll, MD  divalproex  (DEPAKOTE ) 250 MG DR tablet Take 1 tablet (250 mg total) by mouth every 12 (twelve) hours. 03/05/22  Yes Samtani, Jai-Gurmukh, MD  Fluticasone -Umeclidin-Vilant (TRELEGY ELLIPTA ) 100-62.5-25 MCG/ACT AEPB Inhale  1 puff into the lungs daily. 05/20/23  Yes Azell Boll, MD  furosemide  (LASIX ) 40 MG tablet Take 1 tablet (40 mg total) by mouth daily. 06/14/23 06/13/24 Yes Azell Boll, MD  linagliptin  (TRADJENTA ) 5 MG TABS tablet Take 1 tablet (5 mg total) by mouth daily. 04/24/23  Yes Azell Boll, MD  metoprolol  tartrate (LOPRESSOR ) 25 MG tablet Take 1  tablet (25 mg total) by mouth 3 (three) times daily. Patient taking differently: Take 100 mg by mouth 3 (three) times daily. 02/14/23  Yes Genora Kidd, MD  OLANZapine  (ZYPREXA ) 7.5 MG tablet Take 7.5 mg by mouth at bedtime. 01/22/23  Yes [provider]  polyethylene glycol powder (GLYCOLAX /MIRALAX ) 17 GM/SCOOP powder Take 17 g by mouth 3 (three) times daily. 12/11/22  Yes Azell Boll, MD  pravastatin  (PRAVACHOL ) 40 MG tablet TAKE ONE TABLET BY MOUTH EVERY EVENING 11/19/22  Yes Croitoru, Mihai, MD  senna (SENOKOT) 8.6 MG TABS tablet Take 1 tablet (8.6 mg total) by mouth daily as needed for mild constipation. 05/16/23  Yes Azell Boll, MD  sodium bicarbonate  650 MG tablet Take 1 tablet (650 mg total) by mouth 2 (two) times daily. 04/15/23  Yes Azell Boll, MD  acetaminophen  (TYLENOL ) 500 MG tablet Take 1,000 mg by mouth every 6 (six) hours as needed for mild pain or headache.    [provider]  albuterol  (VENTOLIN  HFA) 108 (90 Base) MCG/ACT inhaler Inhale 2 puffs into the lungs every 6 (six) hours as needed for wheezing or shortness of breath. 02/06/23   Azell Boll, MD  Blood Glucose Monitoring Suppl (ONETOUCH VERIO FLEX SYSTEM) w/Device KIT Use to check blood glucose once daily 11/15/22   Azell Boll, MD  Blood Glucose Monitoring Suppl (ONETOUCH VERIO) w/Device KIT Check blood sugar daily. E11.9 03/23/22   Marveen Slick, MD  diclofenac  Sodium (VOLTAREN ) 1 % GEL Apply 2 g topically 4 (four) times daily as needed (pain). 04/30/22   Azell Boll, MD  glucose blood [facility] VERIO) test strip Please use to check blood sugar once daily. E11.9 11/15/22   Azell Boll, MD  hydrocortisone  cream 0.5 % Apply 1 application topically 2 (two) times daily. Apply WITH Nystatin  under left breast 11/28/20   Azell Boll, MD  Incontinence Supplies MISC 1 Units by Does not apply route as needed. 12/13/15   McKeag, Tommie Frame, MD  ipratropium-albuterol  (DUONEB) 0.5-2.5 (3)  MG/3ML SOLN Take 3 mLs by nebulization every 4 (four) hours as needed. 09/13/22   Azell Boll, MD  lactulose  (CHRONULAC ) 10 GM/15ML solution Take 15 mLs (10 g total) by mouth 2 (two) times daily as needed for mild constipation. 09/11/22   Azell Boll, MD  Lancet Device MISC 1 Device by Does not apply route 3 (three) times a week. 06/01/22   Charmel Cooter, MD  nitroGLYCERIN  (NITROSTAT ) 0.4 MG SL tablet Place 1 tablet (0.4 mg total) under the tongue every 5 (five) minutes as needed for chest pain. 08/03/22 02/08/23  Croitoru, Mihai, MD  nystatin  (MYCOSTATIN /NYSTOP ) powder Apply 1 Application topically 3 (three) times daily. Patient not taking: Reported on 05/16/2023 10/23/22   Azell Boll, MD  nystatin  cream (MYCOSTATIN ) Apply 1 Application topically 2 (two) times daily. As needed in skin folds Patient not taking: Reported on 05/16/2023 11/01/22   Azell Boll, MD  OneTouch Delica Lancets 33G MISC Use to test once daily.  DX code:E11.9 06/01/22   Charmel Cooter, MD  OVER THE COUNTER MEDICATION Apply 1 application  topically See admin instructions. Caldesene Medicated Protecting Powder- Apply under the breasts and between the skin folds 2 times a day as needed for irritation    [provider]  triamcinolone  ointment (KENALOG ) 0.5 % Apply 1 Application topically 2 (two) times daily. Apply daily to R flank for 2 weeks 03/21/23   Azell Boll, MD                                                                                                                                    Past Surgical History Past Surgical History:  Procedure Laterality Date   CARDIAC CATHETERIZATION  2007   Non-critical.    CARDIOVERSION N/A 01/27/2021   Procedure: CARDIOVERSION;  Surgeon: Harrold Lincoln, MD;  Location: [facility] ENDOSCOPY;  Service: Cardiovascular;  Laterality: N/A;   CHOLECYSTECTOMY     ELBOW SURGERY     EP IMPLANTABLE DEVICE N/A 08/31/2014   Procedure: PPM Generator Changeout;  Surgeon:  Luana Rumple, MD;  Location: [facility] INVASIVE CV LAB;  Service: Cardiovascular;  Laterality: N/A;   INSERT / REPLACE / REMOVE PACEMAKER  2007   [place]/SYMPTOMATIC HEART BLOCK   PACEMAKER PLACEMENT  09/28/2005   2/2 SSS?   Persantine stress test  05/01/2010   EF 66%. Normal LV sys fx. Unchanged from previous studies.    TRANSTHORACIC ECHOCARDIOGRAM  09/08/10   SEVERE CONCENTRIC HYPERTROPHY.LV FUNCTION WAS VIGOROUS.EF 65%-70%.VENTRICULAR SEPTUM-INCOORDINATE MOTION.LEFT ATRIUM-MILDLY DILATED.TRIVIAL TR.   TUBAL LIGATION     Family History Family History  Problem Relation Age of Onset   Heart disease Mother    Heart disease Father    Stroke Sister    Diabetes type II Son     Social History Social History   Tobacco Use   Smoking status: Former    Current packs/day: 0.00    Types: Cigarettes    Quit date: 02/2022    Years since quitting: 1.3   Smokeless tobacco: Never   Tobacco comments:    1cig a day    07/05/2022 patient quit when going to the hospital 02/2022  Vaping Use   Vaping status: Former  Substance Use Topics   Alcohol  use: No    Alcohol /week: 0.0 standard drinks of alcohol    Drug use: No   Allergies Aripiprazole, Clozapine , Haloperidol  lactate, Benadryl [diphenhydramine hcl], Benztropine , Latuda [lurasidone hcl], Lorazepam , Remeron [mirtazapine], Keflex  [cephalexin ], Codeine, and Latex  Review of Systems A thorough review of systems was obtained and all systems are negative except as noted in the HPI and PMH.   Physical Exam Vital Signs  I have reviewed the triage vital signs BP (!) 136/115   Pulse 69   Temp 98.5 F (36.9 C) (Oral)   Resp (!) 22   Ht 5\' 6"  (1.676 m)   Wt (!) 145.2 kg   SpO2 99%   BMI 51.65 kg/m  Physical Exam Vitals and nursing  note reviewed.  Constitutional:      General: She is not in acute distress.    Appearance: Normal appearance. She is well-developed. She is obese. She is not ill-appearing.  HENT:     Head: Normocephalic  and atraumatic.     Right Ear: External ear normal.     Left Ear: External ear normal.     Nose: Nose normal.     Mouth/Throat:     Mouth: Mucous membranes are moist.  Eyes:     General: No scleral icterus.       Right eye: No discharge.        Left eye: No discharge.  Cardiovascular:     Rate and Rhythm: Normal rate.  Pulmonary:     Effort: Pulmonary effort is normal. Tachypnea present. No respiratory distress.     Breath sounds: Decreased air movement present. No stridor. Decreased breath sounds and rales present.  Abdominal:     General: Abdomen is flat. There is no distension.     Palpations: Abdomen is soft.     Tenderness: There is no guarding.     Comments: Large hernia noted  Musculoskeletal:        General: No deformity.     Cervical back: No rigidity.     Right lower leg: Edema present.     Left lower leg: Edema present.  Skin:    General: Skin is warm and dry.     Coloration: Skin is not cyanotic, jaundiced or pale.  Neurological:     Mental Status: She is alert and oriented to person, place, and time.     GCS: GCS eye subscore is 4. GCS verbal subscore is 5. GCS motor subscore is 6.  Psychiatric:        Speech: Speech normal.        Behavior: Behavior normal. Behavior is cooperative.     ED Results and Treatments Labs (all labs ordered are listed, but only abnormal results are displayed) Labs Reviewed  CBC WITH DIFFERENTIAL/PLATELET - Abnormal; Notable for the following components:      Result Value   WBC 11.5 (*)    RBC 3.57 (*)    Hemoglobin 10.7 (*)    HCT 32.5 (*)    Neutro Abs 8.0 (*)    Abs Immature Granulocytes 0.33 (*)    All other components within normal limits  BASIC METABOLIC PANEL WITH GFR - Abnormal; Notable for the following components:   CO2 20 (*)    Glucose, Bld 190 (*)    BUN 41 (*)    Creatinine, Ser 2.65 (*)    GFR, Estimated 18 (*)    Anion gap 16 (*)    All other components within normal limits  BRAIN NATRIURETIC PEPTIDE -  Abnormal; Notable for the following components:   B Natriuretic Peptide 549.4 (*)    All other components within normal limits  URINALYSIS, ROUTINE W REFLEX MICROSCOPIC - Abnormal; Notable for the following components:   APPearance HAZY (*)    Specific Gravity, Urine 1.002 (*)    Glucose, UA >=500 (*)    Protein, ur 100 (*)    Leukocytes,Ua SMALL (*)    Bacteria, UA MANY (*)    All other components within normal limits  I-STAT CHEM 8, ED - Abnormal; Notable for the following components:   BUN 42 (*)    Creatinine, Ser 2.80 (*)    Glucose, Bld 186 (*)    TCO2 21 (*)    Hemoglobin  10.9 (*)    HCT 32.0 (*)    All other components within normal limits  TROPONIN I (HIGH SENSITIVITY) - Abnormal; Notable for the following components:   Troponin I (High Sensitivity) 18 (*)    All other components within normal limits  TROPONIN I (HIGH SENSITIVITY) - Abnormal; Notable for the following components:   Troponin I (High Sensitivity) 19 (*)    All other components within normal limits  URINE CULTURE  RESP PANEL BY RT-PCR (RSV, FLU A&B, COVID)  [id number]  BASIC METABOLIC PANEL WITH GFR  MAGNESIUM                                                                                                                           Radiology DG Chest Portable 1 View Result Date: 06/14/2023 CLINICAL DATA:  DIB EXAM: PORTABLE CHEST 1 VIEW COMPARISON:  02/08/2023 FINDINGS: Cardiac pacemaker. Diffuse cardiac enlargement. Mild vascular congestion. Bilateral basilar infiltration. Bilateral pleural effusions, greater on the left. Changes may be due to pneumonia or more likely edema. Calcification of the aorta. Limited visualization of mediastinal contours due to patient positioning. IMPRESSION: Cardiac enlargement with pulmonary vascular congestion, bilateral basilar infiltrates, and bilateral pleural effusions. Electronically Signed   By: Boyce Byes M.D.   On: 06/14/2023 18:44    Pertinent labs & imaging results  that were available during my care of the patient were reviewed by me and considered in my medical decision making (see MDM for details).  Medications Ordered in ED Medications  acetaminophen  (TYLENOL ) tablet 650 mg (has no administration in time range)    Or  acetaminophen  (TYLENOL ) suppository 650 mg (has no administration in time range)  metoprolol  tartrate (LOPRESSOR ) tablet 100 mg (has no administration in time range)  OLANZapine  (ZYPREXA ) tablet 7.5 mg (has no administration in time range)  polyethylene glycol powder (GLYCOLAX /MIRALAX ) container 17 g (has no administration in time range)  senna (SENOKOT) tablet 8.6 mg (has no administration in time range)  sodium bicarbonate  tablet 650 mg (has no administration in time range)  apixaban  (ELIQUIS ) tablet 2.5 mg (has no administration in time range)  divalproex  (DEPAKOTE ) DR tablet 250 mg (has no administration in time range)  cholecalciferol  (VITAMIN D3) 25 MCG (1000 UNIT) tablet 1,000 Units (has no administration in time range)  budesonide-glycopyrrolate-formoterol (BREZTRI) 160-9-4.8 MCG/ACT inhaler 2 puff (has no administration in time range)  amLODipine  (NORVASC ) tablet 5 mg (has no administration in time range)  furosemide  (LASIX ) injection 40 mg (40 mg Intravenous Given 06/14/23 2042)  levofloxacin  (LEVAQUIN ) IVPB 750 mg (750 mg Intravenous New Bag/Given 06/14/23 2238)  ipratropium-albuterol  (DUONEB) 0.5-2.5 (3) MG/3ML nebulizer solution 3 mL (3 mLs Nebulization Given 06/14/23 2238)  Procedures .Critical Care  Performed by: Teddi Favors, DO Authorized by: Teddi Favors, DO   Critical care provider statement:    Critical care time (minutes):  30   Critical care time was exclusive of:  Separately billable procedures and treating other patients   Critical care was necessary to treat or prevent  imminent or life-threatening deterioration of the following conditions:  Cardiac failure   Critical care was time spent personally by me on the following activities:  Development of treatment plan with patient or surrogate, discussions with consultants, evaluation of patient's response to treatment, examination of patient, ordering and review of laboratory studies, ordering and review of radiographic studies, ordering and performing treatments and interventions, pulse oximetry, re-evaluation of patient's condition, review of old charts and obtaining history from patient or surrogate   (including critical care time)  Medical Decision Making / ED Course    Medical Decision Making:    Carlyle Achenbach is a 78 y.o. female with past medical history as below, significant for A-fib, CKD, CHF, COPD, paranoid schizophrenia, type II DM who presents to the ED with complaint of abnormal labs. The complaint involves an extensive differential diagnosis and also carries with it a high risk of complications and morbidity.  Serious etiology was considered. Ddx includes but is not limited to: In my evaluation of this patient's dyspnea my DDx includes, but is not limited to, pneumonia, pulmonary embolism, pneumothorax, pulmonary edema, metabolic acidosis, asthma, COPD, cardiac cause, anemia, anxiety, etc.    Complete initial physical exam performed, notably the patient was in no acute distress, tachypnea noted, improved w/ 2L [place].    Reviewed and confirmed nursing documentation for past medical history, family history, social history.  Vital signs reviewed.     Brief summary:  78 yo female w/ hx as above  here w/ abnormal labs and increased wob    Clinical Course as of 06/15/23 0010  Fri Jun 14, 2023  1547 Creatinine(!): 2.65 Similar to prior  [SG]  2000 CXR concerning for chf, pos pna [SG]  2233 B Natriuretic Peptide(!): 549.4 Mild increased from prior [SG]  2233 She is requiring [facility] [SG]    Clinical  Course User Index [SG] Teddi Favors, DO    She has hx schizophrenia, seems to be gradually worsening per the son but not acutely worsened today  Concern for decompensated CHF and possible concurrent PNA Will cover with levaquin  given her keflex  allergy Diuresis started in the ED Continue 2L [place] Recommend admission, pt is agreeable Admit FMTS             Additional history obtained: -Additional history obtained from family -External records from outside source obtained and reviewed including: Chart review including previous notes, labs, imaging, consultation notes including  Recent admission Prior labs Home meds   Lab Tests: -I ordered, reviewed, and interpreted labs.   The pertinent results include:   Labs Reviewed  CBC WITH DIFFERENTIAL/PLATELET - Abnormal; Notable for the following components:      Result Value   WBC 11.5 (*)    RBC 3.57 (*)    Hemoglobin 10.7 (*)    HCT 32.5 (*)    Neutro Abs 8.0 (*)    Abs Immature Granulocytes 0.33 (*)    All other components within normal limits  BASIC METABOLIC PANEL WITH GFR - Abnormal; Notable for the following components:   CO2 20 (*)    Glucose, Bld 190 (*)    BUN 41 (*)  Creatinine, Ser 2.65 (*)    GFR, Estimated 18 (*)    Anion gap 16 (*)    All other components within normal limits  BRAIN NATRIURETIC PEPTIDE - Abnormal; Notable for the following components:   B Natriuretic Peptide 549.4 (*)    All other components within normal limits  URINALYSIS, ROUTINE W REFLEX MICROSCOPIC - Abnormal; Notable for the following components:   APPearance HAZY (*)    Specific Gravity, Urine 1.002 (*)    Glucose, UA >=500 (*)    Protein, ur 100 (*)    Leukocytes,Ua SMALL (*)    Bacteria, UA MANY (*)    All other components within normal limits  I-STAT CHEM 8, ED - Abnormal; Notable for the following components:   BUN 42 (*)    Creatinine, Ser 2.80 (*)    Glucose, Bld 186 (*)    TCO2 21 (*)    Hemoglobin 10.9 (*)     HCT 32.0 (*)    All other components within normal limits  TROPONIN I (HIGH SENSITIVITY) - Abnormal; Notable for the following components:   Troponin I (High Sensitivity) 18 (*)    All other components within normal limits  TROPONIN I (HIGH SENSITIVITY) - Abnormal; Notable for the following components:   Troponin I (High Sensitivity) 19 (*)    All other components within normal limits  URINE CULTURE  RESP PANEL BY RT-PCR (RSV, FLU A&B, COVID)  [id number]  BASIC METABOLIC PANEL WITH GFR  MAGNESIUM     Notable for BNP elev, Cr mild elev but similar to baseline  EKG   EKG Interpretation Date/Time:  Friday Jun 14 2023 14:53:18 EDT Ventricular Rate:  81 PR Interval:  75 QRS Duration:  169 QT Interval:  443 QTC Calculation: 515 R Axis:   266  Text Interpretation: Sinus rhythm Short PR interval Nonspecific IVCD with LAD Confirmed by Russella Courts ([id number]) on 06/14/2023 10:34:17 PM         Imaging Studies ordered: I ordered imaging studies including cxr I independently visualized the following imaging with scope of interpretation limited to determining acute life threatening conditions related to emergency care; findings noted above I agree with the radiologist interpretation If any imaging was obtained with contrast I closely monitored patient for any possible adverse reaction a/w contrast administration in the emergency department   Medicines ordered and prescription drug management: Meds ordered this encounter  Medications   furosemide  (LASIX ) injection 40 mg   levofloxacin  (LEVAQUIN ) IVPB 750 mg    Antibiotic Indication::   CAP   ipratropium-albuterol  (DUONEB) 0.5-2.5 (3) MG/3ML nebulizer solution 3 mL   DISCONTD: enoxaparin  (LOVENOX ) injection 30 mg   OR Linked Order Group    acetaminophen  (TYLENOL ) tablet 650 mg    acetaminophen  (TYLENOL ) suppository 650 mg   metoprolol  tartrate (LOPRESSOR ) tablet 100 mg   OLANZapine  (ZYPREXA ) tablet 7.5 mg   polyethylene glycol powder  (GLYCOLAX /MIRALAX ) container 17 g   senna (SENOKOT) tablet 8.6 mg   sodium bicarbonate  tablet 650 mg   apixaban  (ELIQUIS ) tablet 2.5 mg   divalproex  (DEPAKOTE ) DR tablet 250 mg   cholecalciferol  (VITAMIN D3) 25 MCG (1000 UNIT) tablet 1,000 Units   budesonide-glycopyrrolate-formoterol (BREZTRI) 160-9-4.8 MCG/ACT inhaler 2 puff   amLODipine  (NORVASC ) tablet 5 mg    -I have reviewed the patients home medicines and have made adjustments as needed   Consultations Obtained: I requested consultation with the hospitalist,  and discussed lab and imaging findings as well as pertinent plan    Cardiac Monitoring:  The patient was maintained on a cardiac monitor.  I personally viewed and interpreted the cardiac monitored which showed an underlying rhythm of: nsr Continuous pulse oximetry interpreted by myself, 95% on 2L.    Social Determinants of Health:  Diagnosis or treatment significantly limited by social determinants of health: former smoker   Reevaluation: After the interventions noted above, I reevaluated the patient and found that they have improved  Co morbidities that complicate the patient evaluation  Past Medical History:  Diagnosis Date   Abdominal wall hernia 01/2017   Advanced care planning/counseling discussion 09/29/2021   Atrial fibrillation (HCC)    CHF (congestive heart failure) (HCC)    CKD (chronic kidney disease)    COPD (chronic obstructive pulmonary disease) (HCC)    Coronary artery disease    NON-CRITICAL   Ectasis aorta (HCC)    GERD (gastroesophageal reflux disease)    Gout    Hypertension    Memory difficulties 12/02/2013   Pacemaker    Paranoid schizophrenia (HCC)    Psychosis (HCC)    Tardive dyskinesia 04/26/2011   Type 2 diabetes mellitus (HCC)       Dispostion: Disposition decision including need for hospitalization was considered, and patient admitted to the hospital.    Final Clinical Impression(s) / ED Diagnoses Final diagnoses:   Acute congestive heart failure, unspecified heart failure type (HCC)  Chronic kidney disease, unspecified CKD stage  Community acquired pneumonia, unspecified laterality  Hypoxia        Teddi Favors, DO 06/15/23 0010

## 2023-06-14 NOTE — Telephone Encounter (Signed)
 Called ED to make them aware patient coming for evaluatoin.  Patient with ~ 1 week dyspnea. Has had worsening PND at night. Creatinine and BNP elevated on recent labs. If admitted, please admit to FMTS- [phone].

## 2023-06-14 NOTE — H&P (Addendum)
 Hospital Admission History and Physical Service Pager: [phone]  Patient name: Abigail Wallace Medical record number: [id number] Date of Birth: [birth year] Age: 78 y.o. Gender: female  Primary Care Provider: Azell Boll, MD Consultants: None Code Status: Full   Preferred Emergency Contact:  Contact Information     Name Relation Home Work [place] Son [phone]  [phone]      Other Contacts     Name Relation Home Work Mobile   [name],[name] Son [phone]  [phone]        Chief Complaint: Dyspnea  Assessment and Plan: [name] is a 78 y.o. female presenting with AKI and elevated BNP, likely 2/2 CHF exacerbation with possible superimposed pneumonia. Differential for presentation of this includes:  CHF exacerbation- highest on the differential based on presentation as well as BNP elevation and pulmonary edema seen on CXR. Pneumonia- 2nd most likely, though difficult to fully appreciate, given significant bilateral opacifications of the lung fields. Left lung does follow a more lobar distribution than the right.  COPD exacerbation- less likely given CXR and presentation.  PE- patient is not currently tachycardic, and takes daily eliquis . Her condition is more consistent with the above listed etiologies, though would reconsider if her condition worsens or her HR increases.   Assessment & Plan CHF exacerbation (HCC) Patient with elevated BNP and evidence of fluid overload on exam. Plan as below: - Admit to  Med-tele with FMTS, Dr. Manuel Sell attending -Vitals per floor -Status post 40 mg IV Lasix  in the ED, consider redosing tomorrow -Strict I's and O's -Daily weight -Continue home metoprolol , amlodipine  -Echocardiogram -EKG -Delirium precautions -Fall precautions -AM BMP and mag -Goal K > 4, mag > 2 Dyspnea X-ray done in ED shows possible pneumonia versus pulmonary vascular congestion and edema.  Patient also has history of COPD  which could be contributing. -Continue plan as above to treat CHF -S/p 750 mg of Levaquin , repeat evaluate ABX choice in the morning -Continue home triple therapy -O2 supplementation as needed -Incentive spirometry AKI (acute kidney injury) (HCC) Likely acute on chronic as patient does have CKD stage IIIb at baseline.  Her creatinine is elevated to 2.80, her baseline is anywhere from 1.6-2.0.  Patient also has baseline indwelling catheter which could serve as nidus for infection and she had a dirty UA.  Urine cultures pending. - Hold nephrotoxic agents -Encourage p.o. intake -Follow-up urine culture and sensitivities -Monitor creatinine with daily BMP -consider nephro consult while diuresing Chronic health problem HTN: Continuing home antihypertensives as above Paranoid schizophrenia: Continue home Zyprexa  and home olanzapine . T2DM: Hold home tradjenda, CBGs TID, add back home meds at discharge.  FEN/GI: Heart healthy/carb modified VTE Prophylaxis: home eliquis   Disposition: Med Tele  History of Present Illness:  Abigail Wallace is a 78 y.o. female presenting with 1 week of worsened dyspnea/orthopnea and difficulty sleeping. Patient presented to ED following office visit with her PCP, Dr. Bevin Bucks, where her routine blood work was concerning for elevated Cr, and BNP. Patient also endorses some intermittent abdominal pain, most often suprapubic over the last three months. Her son, who is her primary caregiver and provided the majority of the history, denies any fevers, chills or chest pain.   In the ED, repeat BMP showed continued elevated creatinine, minimally elevated troponins, and a minimally elevated WBCs.  Review Of Systems: Per HPI   Pertinent Past Medical History: Sick Sinus Syndrome, S/p pacemaker CKD 3b Schizophrenia COPD T2DM HTN Meningioma CHF  Remainder  reviewed in history tab.   Pertinent Past Surgical History: Pacemaker  Cardiac Cath Cholecystectomy Bilateral  Tubal ligation   Remainder reviewed in history tab.  Pertinent Social History: Tobacco use: Former Alcohol  use: none Other Substance use: none Lives with her son [name]  Pertinent Family History: Mother and father with heart disease Sister-CVA Son- T2DM  Remainder reviewed in history tab.   Important Outpatient Medications: Albuterol  neb  Lasix  Trelegy Ellipta  Senna Tradjenta  Sodium bicarbonate  Vitamin D3 Norvasc  Lopressor  Olanzapine  Eliquis  Pravastatin  Nitrostat  Allopurinol  Depakote   Remainder reviewed in medication history.   Objective: BP (!) 172/70   Pulse 70   Temp 98.5 F (36.9 C) (Oral)   Resp 19   Ht 5\' 6"  (1.676 m)   Wt (!) 145.2 kg   SpO2 98%   BMI 51.65 kg/m  Exam: General: Ill-appearing elderly female, no distress Eyes: PERRLA EOMI ENTM: Moist mucous membranes Cardiovascular: RRR, no M/R/G Respiratory: Poor air movement bilaterally, fine basilar crackles on the right, poorly auscultated left side due to patient positioning Gastrointestinal: Soft, nontender across the upper abdomen.  Mild tenderness to palpation over the suprapubic area MSK: Moves bilateral upper extremity spontaneously and appropriately.  2+ pitting edema in the bilateral lower extremities  Labs:  CBC BMET  Recent Labs  Lab 06/14/23 1500 06/14/23 1518  WBC 11.5*  --   HGB 10.7* 10.9*  HCT 32.5* 32.0*  PLT 199  --    Recent Labs  Lab 06/14/23 1500 06/14/23 1518  NA 135 135  K 4.4 4.4  CL 99 103  CO2 20*  --   BUN 41* 42*  CREATININE 2.65* 2.80*  GLUCOSE 190* 186*  CALCIUM  10.0  --      Imaging Studies Performed:  Chest x-ray Opacification and vascular congestion of the right lung in all lung fields, left lung significant opacification of the lower lobe with clear upper lobe demonstrating some pulmonary vascular congestion.  Lobar pattern suggest possible underlying pneumonia versus pulmonary edema.  Rayma Calandra, DO 06/14/2023, 10:41 PM PGY-1, [facility]  Health Family Medicine  FPTS Intern pager: [phone], text pages welcome Secure chat group [facility] Teaching Service   I was personally present and re-performed the exam. I verified the service and findings are accurately documented in the intern's note.  Goble Last, MD 06/15/2023 8:31 AM

## 2023-06-15 ENCOUNTER — Inpatient Hospital Stay (HOSPITAL_COMMUNITY)

## 2023-06-15 DIAGNOSIS — I5043 Acute on chronic combined systolic (congestive) and diastolic (congestive) heart failure: Secondary | ICD-10-CM | POA: Diagnosis not present

## 2023-06-15 DIAGNOSIS — G14 Postpolio syndrome: Secondary | ICD-10-CM

## 2023-06-15 DIAGNOSIS — N179 Acute kidney failure, unspecified: Secondary | ICD-10-CM | POA: Diagnosis not present

## 2023-06-15 DIAGNOSIS — J449 Chronic obstructive pulmonary disease, unspecified: Secondary | ICD-10-CM | POA: Diagnosis not present

## 2023-06-15 DIAGNOSIS — F05 Delirium due to known physiological condition: Secondary | ICD-10-CM

## 2023-06-15 HISTORY — DX: Delirium due to known physiological condition: F05

## 2023-06-15 HISTORY — DX: Postpolio syndrome: G14

## 2023-06-15 LAB — BASIC METABOLIC PANEL WITH GFR
Anion gap: 10 (ref 5–15)
BUN: 38 mg/dL — ABNORMAL HIGH (ref 8–23)
CO2: 20 mmol/L — ABNORMAL LOW (ref 22–32)
Calcium: 9.5 mg/dL (ref 8.9–10.3)
Chloride: 106 mmol/L (ref 98–111)
Creatinine, Ser: 2.43 mg/dL — ABNORMAL HIGH (ref 0.44–1.00)
GFR, Estimated: 20 mL/min — ABNORMAL LOW (ref >60)
Glucose, Bld: 125 mg/dL — ABNORMAL HIGH (ref 70–99)
Potassium: 4.3 mmol/L (ref 3.5–5.1)
Sodium: 136 mmol/L (ref 135–145)

## 2023-06-15 LAB — GLUCOSE, CAPILLARY
Glucose-Capillary: 142 mg/dL — ABNORMAL HIGH (ref 70–99)
Glucose-Capillary: 153 mg/dL — ABNORMAL HIGH (ref 70–99)
Glucose-Capillary: 146 mg/dL — ABNORMAL HIGH (ref 70–99)

## 2023-06-15 LAB — PROCALCITONIN: Procalcitonin: <0.1 ng/mL

## 2023-06-15 LAB — MAGNESIUM: Magnesium: 2.1 mg/dL (ref 1.7–2.4)

## 2023-06-15 LAB — TROPONIN I (HIGH SENSITIVITY)
Troponin I (High Sensitivity): 18 ng/L — ABNORMAL HIGH (ref <18)
Troponin I (High Sensitivity): 22 ng/L — ABNORMAL HIGH (ref <18)

## 2023-06-15 MED ORDER — FUROSEMIDE 10 MG/ML IJ SOLN
40.0000 mg | Freq: Once | INTRAMUSCULAR | Status: AC
  Administered 2023-06-15: 40 mg via INTRAVENOUS
  Filled 2023-06-15: qty 4

## 2023-06-15 MED ORDER — ORAL CARE MOUTH RINSE: 15.0000 mL | OROMUCOSAL | Status: DC | PRN

## 2023-06-15 MED ORDER — APIXABAN 2.5 MG PO TABS
2.5000 mg | ORAL_TABLET | Freq: Two times a day (BID) | ORAL | Status: DC
  Administered 2023-06-15 – 2023-06-19 (×9): 2.5 mg via ORAL
  Filled 2023-06-15 (×10): qty 1

## 2023-06-15 MED ORDER — OLANZAPINE 5 MG PO TABS
7.5000 mg | ORAL_TABLET | Freq: Every day | ORAL | Status: DC
  Administered 2023-06-16 – 2023-06-18 (×3): 7.5 mg via ORAL
  Filled 2023-06-15 (×6): qty 1

## 2023-06-15 MED ORDER — METOPROLOL TARTRATE 50 MG PO TABS
100.0000 mg | ORAL_TABLET | Freq: Three times a day (TID) | ORAL | Status: DC
  Administered 2023-06-15: 100 mg via ORAL
  Filled 2023-06-15: qty 2

## 2023-06-15 MED ORDER — AMLODIPINE BESYLATE 5 MG PO TABS
5.0000 mg | ORAL_TABLET | Freq: Every day | ORAL | Status: DC
  Administered 2023-06-15 – 2023-06-18 (×4): 5 mg via ORAL
  Filled 2023-06-15 (×6): qty 1

## 2023-06-15 MED ORDER — CHLORHEXIDINE GLUCONATE CLOTH 2 % EX PADS
6.0000 | MEDICATED_PAD | Freq: Every day | CUTANEOUS | Status: DC
  Administered 2023-06-15 – 2023-06-19 (×5): 6 via TOPICAL

## 2023-06-15 MED ORDER — POLYETHYLENE GLYCOL 3350 17 G PO PACK
17.0000 g | PACK | Freq: Three times a day (TID) | ORAL | Status: DC | PRN
  Administered 2023-06-17: 17 g via ORAL
  Filled 2023-06-15: qty 1

## 2023-06-15 MED ORDER — SODIUM BICARBONATE 650 MG PO TABS
650.0000 mg | ORAL_TABLET | Freq: Two times a day (BID) | ORAL | Status: DC
  Administered 2023-06-15 – 2023-06-19 (×9): 650 mg via ORAL
  Filled 2023-06-15 (×10): qty 1

## 2023-06-15 MED ORDER — VITAMIN D 25 MCG (1000 UNIT) PO TABS
1000.0000 [IU] | ORAL_TABLET | Freq: Every day | ORAL | Status: DC
  Administered 2023-06-15 – 2023-06-19 (×5): 1000 [IU] via ORAL
  Filled 2023-06-15 (×5): qty 1

## 2023-06-15 MED ORDER — METOPROLOL TARTRATE 25 MG PO TABS
25.0000 mg | ORAL_TABLET | Freq: Three times a day (TID) | ORAL | Status: DC
  Administered 2023-06-16 – 2023-06-19 (×10): 25 mg via ORAL
  Filled 2023-06-15 (×10): qty 1

## 2023-06-15 MED ORDER — METOPROLOL TARTRATE 25 MG PO TABS
25.0000 mg | ORAL_TABLET | Freq: Once | ORAL | Status: AC
  Administered 2023-06-15: 25 mg via ORAL
  Filled 2023-06-15: qty 1

## 2023-06-15 MED ORDER — PRAVASTATIN SODIUM 40 MG PO TABS
40.0000 mg | ORAL_TABLET | Freq: Every evening | ORAL | Status: DC
  Administered 2023-06-15 – 2023-06-18 (×4): 40 mg via ORAL
  Filled 2023-06-15 (×4): qty 1

## 2023-06-15 MED ORDER — SENNA 8.6 MG PO TABS
1.0000 | ORAL_TABLET | Freq: Every day | ORAL | Status: DC
  Administered 2023-06-15 – 2023-06-19 (×5): 8.6 mg via ORAL
  Filled 2023-06-15 (×5): qty 1

## 2023-06-15 MED ORDER — BUDESON-GLYCOPYRROL-FORMOTEROL 160-9-4.8 MCG/ACT IN AERO
2.0000 | INHALATION_SPRAY | Freq: Two times a day (BID) | RESPIRATORY_TRACT | Status: DC
  Administered 2023-06-15 – 2023-06-18 (×4): 2 via RESPIRATORY_TRACT
  Filled 2023-06-15 (×2): qty 5.9

## 2023-06-15 MED ORDER — DIVALPROEX SODIUM 250 MG PO DR TAB
250.0000 mg | DELAYED_RELEASE_TABLET | Freq: Two times a day (BID) | ORAL | Status: DC
  Administered 2023-06-15 – 2023-06-19 (×9): 250 mg via ORAL
  Filled 2023-06-15 (×12): qty 1

## 2023-06-15 NOTE — Assessment & Plan Note (Addendum)
 HTN: Continuing home antihypertensives as above. Paranoid schizophrenia: Continue home olanzapine  7.5 mg at bedtime, home Depakote  250 mg BID. T2DM: Hold home tradjenda, CBGs TID, add back home meds at discharge. Sick sinus syndrome: Has pacemaker. Chronic hyponatremia: Continue home sodium bicarbonate  650 mg BID COPD: continue home Breztri 2 puffs BID Postpolio Syndrome: bedbound HLD: continue home pravastatin  40 mg daily Gout: held allopurinol  100 mg due to AKI - add back as indicated

## 2023-06-15 NOTE — Progress Notes (Signed)
 ***  Daily Progress Note Intern Pager: [phone]  Patient name: Abigail Wallace Medical record number: [id number] Date of birth: February 08, 1945 Age: 78 y.o. Gender: female  Primary Care Provider: Azell Boll, MD Consultants: None  Code Status: FULL  Pt Overview and Major Events to Date:  06/14/23: Admitted to FMTS   Assessment and Plan:  Abigail Wallace is a 78 y.o. female presented with suspected CHF exacerbation, found to have AKI.  Assessment & Plan Acute on chronic combined systolic and diastolic heart failure (HCC)  Dry weight appears to be ~300lbs. Net ***. Weight ***. Received Lasix  40 mg  Home regimen Lasix  40 mg daily. -Redose IV Lasix  40 mg today -Strict I&O's, daily weights -Continue home metoprolol  tartrate 25 mg q8h, amlodipine  5 mg daily -Echocardiogram -Delirium precautions -Fall precautions -AM BMP, Mag -Goal K > 4, mag > 2 Dyspnea Resolved this AM on 2 L via O2. Wean as able. Did receive 1 dose of Levaquin  750 mg yesterday; no recent changes concerning for COPD exacerbation - no indication to continue abx. -Continue plan as above to treat CHF -Continue home triple therapy -O2 supplementation if needed, goal 88-92% as pt has COPD -Encourage incentive spirometry AKI (acute kidney injury) (HCC) Patient with CKD IIIb.  Creatinine improving somewhat since admission, 2.43 this AM. Does have chronic indwelling foley catheter - need to collect urine culture. Pt does not know last time catheter was changed. -Hold nephrotoxic agents -Encourage PO intake -Follow-up urine culture and sensitivities -Monitor creatinine with daily BMP -May consider nephro consult while diuresing if Cr worsens Chronic health problem HTN: Continuing home antihypertensives as above. Paranoid schizophrenia: Continue home olanzapine  7.5 mg at bedtime, home Depakote  250 mg BID. T2DM: Hold home tradjenda, CBGs TID, add back home meds at discharge. Sick sinus syndrome: Has pacemaker. Chronic  hyponatremia: Continue home sodium bicarbonate  650 mg BID COPD: continue home Breztri 2 puffs BID Postpolio Syndrome: bedbound HLD: continue home pravastatin  40 mg daily Gout: held allopurinol  100 mg due to AKI - add back as indicated History of Sundowning  Sick sinus syndrome (HCC)  Dependence on wheelchair  CHF (congestive heart failure) (HCC)  COPD (chronic obstructive pulmonary disease) (HCC)  Postpolio syndrome     Chronic and Stable Problems: ***   FEN/GI: *** PPx: *** Dispo:{FPTSDISOLIST:27587} {FPTSDISOTIME:27588}. Barriers include ***.   Subjective:  ***  Objective: Temp:  [98 F (36.7 C)-98.7 F (37.1 C)] 98.3 F (36.8 C) (05/31 2015) Pulse Rate:  [57-76] 72 (05/31 2027) Resp:  [16-24] 18 (05/31 2015) BP: (100-172)/(53-115) 172/66 (05/31 2027) SpO2:  [98 %-100 %] 98 % (05/31 2015) General: Alert and oriented in no apparent distress Heart: Regular rate and rhythm with no murmurs appreciated Lungs: CTA bilaterally, no wheezing Abdomen: Bowel sounds present, no abdominal pain Skin: Warm and dry Extremities: No lower extremity edema   Laboratory: Most recent CBC Lab Results  Component Value Date   WBC 11.5 (H) 06/14/2023   HGB 10.9 (L) 06/14/2023   HCT 32.0 (L) 06/14/2023   MCV 91.0 06/14/2023   PLT 199 06/14/2023   Most recent BMP    Latest Ref Rng & Units 06/15/2023    4:00 AM  BMP  Glucose 70 - 99 mg/dL 846   BUN 8 - 23 mg/dL 38   Creatinine 9.62 - 1.00 mg/dL 9.52   Sodium 841 - 324 mmol/L 136   Potassium 3.5 - 5.1 mmol/L 4.3   Chloride 98 - 111 mmol/L 106   CO2 22 -  32 mmol/L 20   Calcium  8.9 - 10.3 mg/dL 9.5      Ernestina Headland, MD 06/15/2023, 9:08 PM  PGY-3, [facility] Health Family Medicine FPTS Intern pager: [phone], text pages welcome Secure chat group [facility] Teaching Service

## 2023-06-15 NOTE — ED Notes (Signed)
 Patient was given a cup of water.

## 2023-06-15 NOTE — Assessment & Plan Note (Addendum)
 Patient with elevated BNP and evidence of fluid overload on exam. Plan as below: - Admit to  Med-tele with FMTS, Dr. Manuel Sell attending -Vitals per floor -Status post 40 mg IV Lasix  in the ED, consider redosing tomorrow -Strict I's and O's -Daily weight -Continue home metoprolol , amlodipine  -Echocardiogram -EKG -Delirium precautions -Fall precautions -AM BMP and mag -Goal K > 4, mag > 2

## 2023-06-15 NOTE — Assessment & Plan Note (Signed)
 X-ray done in ED shows possible pneumonia versus pulmonary vascular congestion and edema.  Patient also has history of COPD which could be contributing. -Continue plan as above to treat CHF -S/p 750 mg of Levaquin , repeat evaluate ABX choice in the morning -Continue home triple therapy -O2 supplementation as needed -Incentive spirometry

## 2023-06-15 NOTE — Assessment & Plan Note (Addendum)
 Resolved this AM on 2 L via O2. Wean as able. Did receive 1 dose of Levaquin  750 mg yesterday; no recent changes concerning for COPD exacerbation - no indication to continue abx. -Continue plan as above to treat CHF -Continue home triple therapy -O2 supplementation if needed, goal 88-92% as pt has COPD -Encourage incentive spirometry

## 2023-06-15 NOTE — Plan of Care (Signed)

## 2023-06-15 NOTE — Assessment & Plan Note (Signed)
 HTN: Continuing home antihypertensives, no changes to home antiHTN regimen unless significantly elevated in hospital, defer to pcp for continuing GDMT as tolerated. Paranoid schizophrenia: Continue home olanzapine  7.5 mg at bedtime, home Depakote  250 mg BID. T2DM: Hold home tradjenda, CBGs TID, add back home meds at discharge. Sick sinus syndrome: Has pacemaker. Chronic hyponatremia: Continue home sodium bicarbonate  650 mg BID COPD: continue home Breztri 2 puffs BID Postpolio Syndrome: bedbound HLD: continue home pravastatin  40 mg daily Gout: held allopurinol  100 mg due to AKI - add back as indicated

## 2023-06-15 NOTE — Assessment & Plan Note (Addendum)
 Patient with CKD IIIb.  Creatinine improving somewhat since admission, 2.43 this AM. Does have chronic indwelling foley catheter - need to collect urine culture. Pt does not know last time catheter was changed. -Hold nephrotoxic agents -Encourage PO intake -Follow-up urine culture and sensitivities -Monitor creatinine with daily BMP -May consider nephro consult while diuresing if Cr worsens

## 2023-06-15 NOTE — ED Notes (Signed)
 Pt stated "I am not taking any of those medicines until you talk to my doctor about them".

## 2023-06-15 NOTE — Assessment & Plan Note (Signed)
 HTN: Continuing home antihypertensives as above Paranoid schizophrenia: Continue home Zyprexa  and home olanzapine . T2DM: Hold home tradjenda, CBGs TID, add back home meds at discharge.

## 2023-06-15 NOTE — Progress Notes (Signed)
 Daily Progress Note Intern Pager: [phone]  Patient name: Abigail Wallace Medical record number: [id number] Date of birth: [birth year] Age: 78 y.o. Gender: female  Primary Care Provider: Azell Boll, MD Consultants: None Code Status: Full  Pt Overview and Major Events to Date:  5/30-admitted  Assessment and Plan: Abigail Wallace is a 78 y.o. female presented with CHF exacerbation, found to have AKI. Stable this morning and diuresing well. Assessment & Plan CHF exacerbation (HCC) Approaching euvolemia this AM s/p 40 mg IV Lasix  in the ED yesterday. Has put out 3.5 L since admission. Breathing well on 2 L via [place]. Dry weight appears to be ~300lbs, and pt is 320lbs on arrival to hospital. Appears more likely that clinical picture is related to [facility] rather than [facility]. Home regimen Lasix  40 mg daily. -Redose IV Lasix  40 mg today -Strict I&O's, daily weights -Continue home metoprolol  tartrate 25 mg q8h, amlodipine  5 mg daily -Echocardiogram -Delirium precautions -Fall precautions -AM BMP, Mag -Goal K > 4, mag > 2 Dyspnea Resolved this AM on 2 L via O2. Wean as able. Did receive 1 dose of Levaquin  750 mg yesterday; no recent changes concerning for COPD exacerbation - no indication to continue abx. -Continue plan as above to treat CHF -Continue home triple therapy -O2 supplementation if needed, goal 88-92% as pt has COPD -Encourage incentive spirometry AKI (acute kidney injury) (HCC) Patient with CKD IIIb.  Creatinine improving somewhat since admission, 2.43 this AM. Does have chronic indwelling foley catheter - need to collect urine culture. Pt does not know last time catheter was changed. -Hold nephrotoxic agents -Encourage PO intake -Follow-up urine culture and sensitivities -Monitor creatinine with daily BMP -May consider nephro consult while diuresing if Cr worsens Chronic health problem HTN: Continuing home antihypertensives as above. Paranoid schizophrenia: Continue  home olanzapine  7.5 mg at bedtime, home Depakote  250 mg BID. T2DM: Hold home tradjenda, CBGs TID, add back home meds at discharge. Sick sinus syndrome: Has pacemaker. Chronic hyponatremia: Continue home sodium bicarbonate  650 mg BID COPD: continue home Breztri 2 puffs BID Postpolio Syndrome: bedbound HLD: continue home pravastatin  40 mg daily Gout: held allopurinol  100 mg due to AKI - add back as indicated History of Sundowning   FEN/GI: Heart healthy/carb modified diet; bowel regimen: Miralax  TID PRN, Senna daily. PPx: Home Eliquis  2.5 mg BID Dispo:Home pending clinical improvement . Barriers include clinical stability.   Subjective:  Patient seen this morning sitting in bed.  She is alert and oriented but quite tangential.  Reports pain all over but then tells me she has always felt this way.  Reports no trouble with her breathing.  Asks me to help her call her son.  Objective: Temp:  [98 F (36.7 C)-98.5 F (36.9 C)] 98 F (36.7 C) (05/31 0904) Pulse Rate:  [57-168] 76 (05/31 0904) Resp:  [16-25] 18 (05/31 0904) BP: (114-173)/(53-132) 155/74 (05/31 0904) SpO2:  [97 %-100 %] 99 % (05/31 0904) Weight:  [145.2 kg] 145.2 kg (05/30 1452) Physical Exam: General: Chronically ill-appearing, NAD. Cardiovascular: RRR, no murmurs. Respiratory: Very difficult due to body habitus.  Normal work of breathing on 2L via [place]. Abdomen: Normoactive bowel sounds, soft, nontender. Extremities: 1+ pitting edema to bilateral LEs.  Laboratory: Most recent CBC Lab Results  Component Value Date   WBC 11.5 (H) 06/14/2023   HGB 10.9 (L) 06/14/2023   HCT 32.0 (L) 06/14/2023   MCV 91.0 06/14/2023   PLT 199 06/14/2023   Most recent BMP  Latest Ref Rng & Units 06/15/2023    4:00 AM  BMP  Glucose 70 - 99 mg/dL 161   BUN 8 - 23 mg/dL 38   Creatinine 0.96 - 1.00 mg/dL 0.45   Sodium 409 - 811 mmol/L 136   Potassium 3.5 - 5.1 mmol/L 4.3   Chloride 98 - 111 mmol/L 106   CO2 22 - 32 mmol/L 20    Calcium  8.9 - 10.3 mg/dL 9.5    Magnesium  2.1  [name], DO 06/15/2023, 9:35 AM  PGY-1, [facility] Health Family Medicine FPTS Intern pager: [phone], text pages welcome Secure chat group [facility] Teaching Service

## 2023-06-15 NOTE — Assessment & Plan Note (Signed)
 Patient with CKD IIIb.  Creatinine improving somewhat since admission, 2.43 this AM. Does have chronic indwelling foley catheter - need to collect urine culture. Pt does not know last time catheter was changed. -Hold nephrotoxic agents -Encourage PO intake -Follow-up urine culture and sensitivities -Monitor creatinine with daily BMP -May consider nephro consult while diuresing if Cr worsens

## 2023-06-15 NOTE — Assessment & Plan Note (Signed)
 Dry weight appears to be ~300lbs. Net -5.3L. Weight does not appear accurate>320. Received Lasix  40 mg IV yesterday. Back to room air. Redose IV today and anticipate transition to PO tomorrow.  Home regimen Lasix  40 mg daily. Home Metoprolol  reported at TID>ordered home dose per patient son q8 25 mg  -Redose IV Lasix  40 mg today vs transition to PO  -Strict I&O's, daily weights -Continue home metoprolol  tartrate 25 mg q8h, amlodipine  5 mg daily -Echocardiogram -Delirium precautions -Fall precautions -AM BMP, Mag -Goal K > 4, mag > 2

## 2023-06-15 NOTE — Assessment & Plan Note (Signed)
 Likely acute on chronic as patient does have CKD stage IIIb at baseline.  Her creatinine is elevated to 2.80, her baseline is anywhere from 1.6-2.0.  Patient also has baseline indwelling catheter which could serve as nidus for infection and she had a dirty UA.  Urine cultures pending. - Hold nephrotoxic agents -Encourage p.o. intake -Follow-up urine culture and sensitivities -Monitor creatinine with daily BMP -consider nephro consult while diuresing

## 2023-06-15 NOTE — Assessment & Plan Note (Signed)
 Resolved this AM on RA. Did receive 1 dose of Levaquin  750 mg during this hospitalization; no recent changes concerning for COPD exacerbation - no indication to continue abx. CT chest clear. Repeat echo pending  -Continue plan as above to treat CHF -Continue home triple therapy -O2 supplementation if needed, goal 88-92% as pt has COPD -Encourage incentive spirometry

## 2023-06-15 NOTE — Assessment & Plan Note (Addendum)
 Approaching euvolemia this AM s/p 40 mg IV Lasix  in the ED yesterday. Has put out 3.5 L since admission. Breathing well on 2 L via [place]. Dry weight appears to be ~300lbs, and pt is 320lbs on arrival to hospital. Appears more likely that clinical picture is related to [facility] rather than [facility]. Home regimen Lasix  40 mg daily. -Redose IV Lasix  40 mg today -Strict I&O's, daily weights -Continue home metoprolol  tartrate 25 mg q8h, amlodipine  5 mg daily -Echocardiogram -Delirium precautions -Fall precautions -AM BMP, Mag -Goal K > 4, mag > 2

## 2023-06-15 NOTE — Hospital Course (Addendum)
 Abigail Wallace Abigail Wallace is a 78 y.o.female with a history of CHF, COPD, T2DM, HTN, sick sinus syndrome with pacemaker, CKD 3b, paranoid schizophrenia who was admitted to the [facility] Medicine Teaching Service at [facility] for dyspnea.   Her hospital course is detailed below:  CHF exacerbation  dyspnea In the ED labs revealed minimally elevated troponins, BNP elevated to 549.  CXR with vascular congestion, bilateral basilar infiltrates, and pleural effusions.  Estimated 20 pounds above dry weight on admission.  She did receive 40 mg IV Lasix  in the ED and diuresed 3.5L initially.  Echocardiogram obtained which showed***.  Repeat CXR obtained***.  CT chest was obtained which showed***.   AKI Creatinine on admission elevated to 2.8, baseline appears to be between 1.6-2.0.  She maintained good PO intake and creatinine did come down despite aggressive diuresis***.   Other chronic conditions were medically managed with home medications and formulary alternatives as necessary (HTN, paranoid schizophrenia, T2DM, sick sinus syndrome, chronic hyponatremia, COPD, postpolio syndrome, HLD, gout)    PCP Follow-up Recommendations: ***

## 2023-06-16 ENCOUNTER — Encounter (HOSPITAL_COMMUNITY): Payer: Self-pay | Admitting: Family Medicine

## 2023-06-16 ENCOUNTER — Inpatient Hospital Stay (HOSPITAL_COMMUNITY)

## 2023-06-16 DIAGNOSIS — I5021 Acute systolic (congestive) heart failure: Secondary | ICD-10-CM

## 2023-06-16 DIAGNOSIS — N1832 Chronic kidney disease, stage 3b: Secondary | ICD-10-CM | POA: Diagnosis not present

## 2023-06-16 DIAGNOSIS — N179 Acute kidney failure, unspecified: Secondary | ICD-10-CM | POA: Diagnosis not present

## 2023-06-16 DIAGNOSIS — I5043 Acute on chronic combined systolic (congestive) and diastolic (congestive) heart failure: Secondary | ICD-10-CM | POA: Diagnosis not present

## 2023-06-16 LAB — GLUCOSE, CAPILLARY
Glucose-Capillary: 171 mg/dL — ABNORMAL HIGH (ref 70–99)
Glucose-Capillary: 182 mg/dL — ABNORMAL HIGH (ref 70–99)
Glucose-Capillary: 152 mg/dL — ABNORMAL HIGH (ref 70–99)

## 2023-06-16 LAB — BASIC METABOLIC PANEL WITH GFR
Anion gap: 12 (ref 5–15)
BUN: 36 mg/dL — ABNORMAL HIGH (ref 8–23)
CO2: 19 mmol/L — ABNORMAL LOW (ref 22–32)
Calcium: 9.8 mg/dL (ref 8.9–10.3)
Chloride: 107 mmol/L (ref 98–111)
Creatinine, Ser: 2.61 mg/dL — ABNORMAL HIGH (ref 0.44–1.00)
GFR, Estimated: 18 mL/min — ABNORMAL LOW (ref >60)
Glucose, Bld: 171 mg/dL — ABNORMAL HIGH (ref 70–99)
Potassium: 3.9 mmol/L (ref 3.5–5.1)
Sodium: 138 mmol/L (ref 135–145)

## 2023-06-16 LAB — MAGNESIUM: Magnesium: 2 mg/dL (ref 1.7–2.4)

## 2023-06-16 LAB — ECHOCARDIOGRAM COMPLETE
Area-P 1/2: 5.02 cm2
Height: 66 in
Weight: 5128.78 [oz_av]

## 2023-06-16 MED ORDER — FUROSEMIDE 10 MG/ML IJ SOLN
40.0000 mg | Freq: Once | INTRAMUSCULAR | Status: AC
  Administered 2023-06-16: 40 mg via INTRAVENOUS
  Filled 2023-06-16: qty 4

## 2023-06-16 NOTE — Progress Notes (Signed)
  Echocardiogram 2D Echocardiogram has been performed.  Tahnee Cifuentes 06/16/2023, 5:50 PM

## 2023-06-16 NOTE — Evaluation (Signed)
 Physical Therapy Evaluation Patient Details Name: Abigail Wallace MRN: [id number] DOB: 1945-11-09 Today's Date: 06/16/2023  History of Present Illness  Pt is an 78 y/o female admitted 5/30 with suspected CHF exacerbation, AKI.   PMH: COPD on 2L via [place], CHF, mild dementia, schizophrenia, urinary rentention with chronic foley cath in place, post polio syndrome, DM, pacemaker  Clinical Impression  Pt is presenting close to baseline level of functioning. Son provides wonderful care for patient but due to pt current high levels of anxiety and fear of falling pt is unable to adequately participate in skilled physical therapy services. Pt was 2 person Mod A for bed mobility and refused OOB. Son states they have equipment at home but mother refuses lift due to fear of falling from previous fall out of a lift. He is working towards getting a sit to stand. Son would like for pt to improve mobility but understands she needs to make this decision for herself and no one can force her to mobilize more than she is ready. Pt states she will only sit EOB for PT at this time and is moving at baseline level of function. Currently pt is presenting at baseline level of functioning and no skilled physical therapy services recommended. Pt will be discharged from skilled physical therapy services at this time; please re-consult if further needs arise.            If plan is discharge home, recommend the following: Two people to help with walking and/or transfers;Assist for transportation;Assistance with cooking/housework;Supervision due to cognitive status;Help with stairs or ramp for entrance     Equipment Recommendations None recommended by PT     Functional Status Assessment Patient has not had a recent decline in their functional status     Precautions / Restrictions Precautions Precautions: Fall Precaution/Restrictions Comments: large abdominal hernia Restrictions Weight Bearing Restrictions Per Provider Order: No       Mobility  Bed Mobility Overal bed mobility: Needs Assistance Bed Mobility: Sit to Supine, Supine to Sit     Supine to sit: +2 for physical assistance, Mod assist Sit to supine: Mod assist, +2 for physical assistance   General bed mobility comments: Pt is Max A of 2 for trunk to mid line and LE off bed, Pt is able to assist with moving LE toward EOB and pushing up to midline. Pt was then able to assist lowering herself to the bed and lifting LE into the bed, Performed supine to sitting 2x during session due to pt wanted to each lunch sitting EOB after stating she wanted to lay back down.    Transfers   General transfer comment: Pt declined; did not attempt        Balance Overall balance assessment: Needs assistance Sitting-balance support: Bilateral upper extremity supported, Feet unsupported Sitting balance-Leahy Scale: Poor Sitting balance - Comments: Min A to very close SBA for balance sitting EOB due to high fear of falling pt tends to lean posteriorly placing her at a greater risk for sliding off EOB         Pertinent Vitals/Pain Pain Assessment Pain Assessment: Faces Faces Pain Scale: Hurts a little bit Pain Location: abdomen with pressure Pain Descriptors / Indicators: Grimacing Pain Intervention(s): Monitored during session    Home Living Family/patient expects to be discharged to:: Private residence Living Arrangements: Children (lives with son) Available Help at Discharge: Family;Available 24 hours/day;Personal care attendant Type of Home: Apartment Home Access: Level entry       Home  Layout: One level Home Equipment: Shower seat;BSC/3in1;Tub bench;Rollator (4 wheels);Hospital bed Additional Comments: Pt's son bought a hoyer lift to attempt to transfer out of pocket.    Prior Function Prior Level of Function : Needs assist             Mobility Comments: Pt has been bed level, son gets her to W/C on Sundays by lifting her in a firemans carry to  W/C ADLs Comments: Son and PCA's assist pt with all aspects of ADL's.     Extremity/Trunk Assessment   Upper Extremity Assessment Upper Extremity Assessment: Defer to OT evaluation    Lower Extremity Assessment Lower Extremity Assessment: Generalized weakness (atleast 3/5 strength pt is able to assist with lifting LE against gravity bil)    Cervical / Trunk Assessment Cervical / Trunk Assessment: Kyphotic  Communication   Communication Communication: Impaired Factors Affecting Communication: Reduced clarity of speech    Cognition Arousal: Alert Behavior During Therapy: Anxious, Flat affect   PT - Cognitive impairments: History of cognitive impairments       PT - Cognition Comments: Pt is very anxious throughout session mutliple times stating "don't let me fall" when she was solidly laying in the bed Following commands: Intact       Cueing Cueing Techniques: Verbal cues, Tactile cues     General Comments General comments (skin integrity, edema, etc.): Son present and very supportive throughout session.        Assessment/Plan    PT Assessment Patient does not need any further PT services         PT Goals (Current goals can be found in the Care Plan section)  Acute Rehab PT Goals PT Goal Formulation: All assessment and education complete, DC therapy            AM-PAC PT "6 Clicks" Mobility  Outcome Measure Help needed turning from your back to your side while in a flat bed without using bedrails?: A Lot Help needed moving from lying on your back to sitting on the side of a flat bed without using bedrails?: A Lot Help needed moving to and from a bed to a chair (including a wheelchair)?: Total Help needed standing up from a chair using your arms (e.g., wheelchair or bedside chair)?: Total Help needed to walk in hospital room?: Total Help needed climbing 3-5 steps with a railing? : Total 6 Click Score: 8    End of Session   Activity Tolerance: Patient  limited by fatigue;Other (comment) (limited by anxiety and fear of falling) Patient left: in bed;with call bell/phone within reach;with family/visitor present;with bed alarm set Nurse Communication: Mobility status      Time: 1124-1203 PT Time Calculation (min) (ACUTE ONLY): 39 min   Charges:   PT Evaluation $PT Eval Low Complexity: 1 Low   PT General Charges $$ ACUTE PT VISIT: 1 Visit        Sloan Duncans, DPT, CLT  Acute Rehabilitation Services Office: [phone] (Secure chat preferred)   Jenice Mitts 06/16/2023, 2:08 PM

## 2023-06-16 NOTE — Evaluation (Signed)
 Occupational Therapy Evaluation Patient Details Name: Abigail Wallace MRN: [id number] DOB: 1945/07/30 Today's Date: 06/16/2023   History of Present Illness   Pt is an 78 y/o female admitted 5/30 with suspected CHF exacerbation, AKI.   PMH: COPD on 2L via [place], CHF, mild dementia, schizophrenia, urinary rentention with chronic foley cath in place, post polio syndrome, DM, pacemaker     Clinical Impressions Pt admitted for above and presents with problem list below.  Son provides care for patient, reports having aide assist during the day so he can work.  He reports attempts at therapy at home, but pt ultimately declines/stops therapy due to anxiety, fear of falling, and not wanting to participate.  He reports at baseline, she needs assist for ADLs, transfers and spends most of her time in bed.  Son transfers patient face to face, due to pts fear of hoyer and sit to stand lift.  Pt able to assist in UB/grooming ADLs on better days, but at times requires up to total assist.  Pt agreeable to EOB today, completed x 2 mod assist +2 assist, and is at baseline for ADLs.  Based on performance today, no further acute OT needs identified at this time.  OT will sign off.  If further needs arise, please re-consult.      If plan is discharge home, recommend the following:   Two people to help with walking and/or transfers;Assist for transportation;Help with stairs or ramp for entrance;A lot of help with bathing/dressing/bathroom;Assistance with cooking/housework;Direct supervision/assist for medications management;Direct supervision/assist for financial management     Functional Status Assessment   Patient has had a recent decline in their functional status and/or demonstrates limited ability to make significant improvements in function in a reasonable and predictable amount of time     Equipment Recommendations   None recommended by OT     Recommendations for Other Services          Precautions/Restrictions   Precautions Precautions: Fall Precaution/Restrictions Comments: large abdominal hernia Restrictions Weight Bearing Restrictions Per Provider Order: No     Mobility Bed Mobility Overal bed mobility: Needs Assistance Bed Mobility: Sit to Supine, Supine to Sit     Supine to sit: +2 for physical assistance, Max assist Sit to supine: +2 for physical assistance, Max assist   General bed mobility comments: Pt is Max A of 2 for trunk to mid line and LE off bed, Pt is able to assist with moving LE toward EOB and pushing up to midline. Pt was then able to assist lowering herself to the bed and lifting LE into the bed, Performed supine to sitting 2x during session due to pt wanted to each lunch sitting EOB after stating she wanted to lay back down.    Transfers                   General transfer comment: Pt declined; did not attempt      Balance Overall balance assessment: Needs assistance Sitting-balance support: Bilateral upper extremity supported, Feet unsupported, Single extremity supported Sitting balance-Leahy Scale: Poor Sitting balance - Comments: Min A to very close SBA for balance sitting EOB due to high fear of falling pt tends to lean posteriorly placing her at a greater risk for sliding off EOB                                   ADL either performed or assessed  with clinical judgement   ADL Overall ADL's : At baseline                                       General ADL Comments: pt at baseline for ADLs, tends to require setup to total assist for grooming (depending on the day), and max-total assist for bathing/dressing.     Vision   Vision Assessment?: No apparent visual deficits     Perception         Praxis         Pertinent Vitals/Pain Pain Assessment Pain Assessment: Faces Faces Pain Scale: Hurts a little bit Pain Location: abdomen with pressure Pain Descriptors / Indicators:  Grimacing Pain Intervention(s): Limited activity within patient's tolerance, Monitored during session, Repositioned     Extremity/Trunk Assessment Upper Extremity Assessment Upper Extremity Assessment: Generalized weakness (limited assessment)   Lower Extremity Assessment Lower Extremity Assessment: Defer to PT evaluation   Cervical / Trunk Assessment Cervical / Trunk Assessment: Kyphotic (increased body habitus)   Communication Communication Communication: Impaired Factors Affecting Communication: Reduced clarity of speech   Cognition Arousal: Alert Behavior During Therapy: Anxious, Flat affect Cognition: No apparent impairments             OT - Cognition Comments: not formally assessed, pt appears more self limiting.  She has a very specific way of doing things and tends let others assist her.  Son reports some days are better than others.                 Following commands: Intact       Cueing  General Comments   Cueing Techniques: Verbal cues;Tactile cues  Son present and very supportive throughout session.   Exercises     Shoulder Instructions      Home Living Family/patient expects to be discharged to:: Private residence Living Arrangements: Children (lives with son) Available Help at Discharge: Family;Available 24 hours/day;Personal care attendant Type of Home: Apartment Home Access: Level entry     Home Layout: One level     Bathroom Shower/Tub: Chief Strategy Officer: Standard Bathroom Accessibility: Yes   Home Equipment: Shower seat;BSC/3in1;Tub bench;Rollator (4 wheels);Hospital bed (hoyer, sit to stand lift)   Additional Comments: Pt's son bought a hoyer lift to attempt to transfer out of pocket.      Prior Functioning/Environment Prior Level of Function : Needs assist             Mobility Comments: Pt has been bed level, son gets her to W/C on Sundays by lifting her in a firemans carry to W/C ADLs Comments: Son  and PCA's assist pt with all aspects of ADL's. Bathes at EOB, tends to be self limiting and can do more on "good days"    OT Problem List: Decreased strength;Decreased activity tolerance;Impaired balance (sitting and/or standing);Obesity;Decreased knowledge of precautions;Decreased knowledge of use of DME or AE   OT Treatment/Interventions:        OT Goals(Current goals can be found in the care plan section)   Acute Rehab OT Goals Patient Stated Goal: sit up to eat OT Goal Formulation: With patient   OT Frequency:       Co-evaluation              AM-PAC OT "6 Clicks" Daily Activity     Outcome Measure Help from another person eating meals?: A Little Help from another  person taking care of personal grooming?: A Little Help from another person toileting, which includes using toliet, bedpan, or urinal?: Total Help from another person bathing (including washing, rinsing, drying)?: A Lot Help from another person to put on and taking off regular upper body clothing?: A Lot Help from another person to put on and taking off regular lower body clothing?: Total 6 Click Score: 12   End of Session Nurse Communication: Mobility status  Activity Tolerance: Patient tolerated treatment well Patient left: with call bell/phone within reach;with bed alarm set;with family/visitor present  OT Visit Diagnosis: Muscle weakness (generalized) (M62.81);Other abnormalities of gait and mobility (R26.89)                Time: 1610-9604 OT Time Calculation (min): 37 min Charges:  OT General Charges $OT Visit: 1 Visit OT Evaluation $OT Eval Moderate Complexity: 1 Mod  Bary Boss, OT Acute Rehabilitation Services Office [phone] Secure Chat Preferred    Fredrich Jefferson 06/16/2023, 2:23 PM

## 2023-06-16 NOTE — Plan of Care (Signed)
   Problem: Education: Goal: Knowledge of General Education information will improve Description Including pain rating scale, medication(s)/side effects and non-pharmacologic comfort measures Outcome: Progressing

## 2023-06-17 ENCOUNTER — Telehealth: Payer: Self-pay

## 2023-06-17 ENCOUNTER — Encounter: Payer: Self-pay | Admitting: Family Medicine

## 2023-06-17 DIAGNOSIS — I5043 Acute on chronic combined systolic (congestive) and diastolic (congestive) heart failure: Secondary | ICD-10-CM | POA: Diagnosis not present

## 2023-06-17 LAB — URINE CULTURE: Culture: >=100000 — AB

## 2023-06-17 LAB — BASIC METABOLIC PANEL WITH GFR
Anion gap: 13 (ref 5–15)
BUN: 40 mg/dL — ABNORMAL HIGH (ref 8–23)
CO2: 17 mmol/L — ABNORMAL LOW (ref 22–32)
Calcium: 9.9 mg/dL (ref 8.9–10.3)
Chloride: 106 mmol/L (ref 98–111)
Creatinine, Ser: 2.92 mg/dL — ABNORMAL HIGH (ref 0.44–1.00)
GFR, Estimated: 16 mL/min — ABNORMAL LOW (ref >60)
Glucose, Bld: 138 mg/dL — ABNORMAL HIGH (ref 70–99)
Potassium: 3.9 mmol/L (ref 3.5–5.1)
Sodium: 136 mmol/L (ref 135–145)

## 2023-06-17 LAB — MAGNESIUM: Magnesium: 1.9 mg/dL (ref 1.7–2.4)

## 2023-06-17 LAB — GLUCOSE, CAPILLARY
Glucose-Capillary: 143 mg/dL — ABNORMAL HIGH (ref 70–99)
Glucose-Capillary: 210 mg/dL — ABNORMAL HIGH (ref 70–99)
Glucose-Capillary: 177 mg/dL — ABNORMAL HIGH (ref 70–99)

## 2023-06-17 MED ORDER — POTASSIUM CHLORIDE CRYS ER 20 MEQ PO TBCR
20.0000 meq | EXTENDED_RELEASE_TABLET | Freq: Once | ORAL | Status: AC
  Administered 2023-06-17: 20 meq via ORAL
  Filled 2023-06-17: qty 1

## 2023-06-17 MED ORDER — ALUM & MAG HYDROXIDE-SIMETH 200-200-20 MG/5ML PO SUSP
30.0000 mL | ORAL | Status: DC | PRN
  Administered 2023-06-17: 30 mL via ORAL
  Filled 2023-06-17: qty 30

## 2023-06-17 NOTE — Plan of Care (Signed)
  Problem: Coping: Goal: Level of anxiety will decrease Outcome: Not Progressing  Patient is paranoid about someone walking in the hall that she felt was trying to rob her.    Patient occasionally non compliant with meds - better tonight that her son wrote down all her meds and she can see that she is getting what she gets at home.  Patient states that she has not received a bath since she has been here.  She has been washed up multiple times and peri/foley care completed and required for each shift.  This was explained to her.  Tonight patients bed linens were changed and she will get a bed bath this shift.

## 2023-06-17 NOTE — Telephone Encounter (Signed)
 Called son and discussed. Very appreciative of inpatient team's care.  Otho Blitz, MD  Family Medicine Teaching Service

## 2023-06-17 NOTE — Assessment & Plan Note (Signed)
 HTN: Continuing home antihypertensives, no changes to home antiHTN regimen unless significantly elevated in hospital, defer to pcp for continuing GDMT as tolerated. Paranoid schizophrenia: Continue home olanzapine  7.5 mg at bedtime, home Depakote  250 mg BID. T2DM: Hold home tradjenda, CBGs TID, add back home meds at discharge. Sick sinus syndrome: Has pacemaker. Chronic hyponatremia: Continue home sodium bicarbonate  650 mg BID COPD: continue home Breztri 2 puffs BID Postpolio Syndrome: bedbound HLD: continue home pravastatin  40 mg daily Gout: held allopurinol  100 mg due to AKI - add back as indicated

## 2023-06-17 NOTE — Plan of Care (Signed)

## 2023-06-17 NOTE — Telephone Encounter (Signed)
 Received call from Deandra, RN [name] requesting verbal orders for visits once per month for catheter changes and 2 PRN visits.   Verbal order provided per protocol.   Elsie Halo, RN

## 2023-06-17 NOTE — Progress Notes (Signed)
 Mobility Specialist: Progress Note   06/17/23 1257  Mobility  Activity Dangled on edge of bed  Level of Assistance +2 (takes two people)  Activity Response Tolerated fair  Mobility visit 1 Mobility  Mobility Specialist Start Time (ACUTE ONLY) 0948  Mobility Specialist Stop Time (ACUTE ONLY) 0954  Mobility Specialist Time Calculation (min) (ACUTE ONLY) 6 min    Pt requesting assistance to sit EOB and eat breakfast. MaxA+2 with NT to assist with trunk elevation and scooting EOB. Left EOB with all needs met, call bell in reach.   Abigail Wallace Mobility Specialist Please contact via SecureChat or Rehab office at [phone]

## 2023-06-17 NOTE — Progress Notes (Signed)
 Daily Progress Note Intern Pager: [phone]  Patient name: Abigail Wallace Medical record number: [id number] Date of birth: 07/09/1945 Age: 78 y.o. Gender: female  Primary Care Provider: Azell Boll, MD Consultants: none Code Status: Full  Pt Overview and Major Events to Date:  5/30: Admitted  Assessment and Plan:  This is an 78 year old female patient presenting with suspected CHF exacerbation found to have AKI. Assessment & Plan Acute on chronic combined systolic and diastolic heart failure (HCC)  Dry weight appears to be ~300lbs. Net output not yet calculated since yesterday. Creatinine elevated from yesterday, will hold lasix  and plan to transition to PO tomorrow if appropriate. Echo completed, poor quality due to patient non-compliance with positioning, but no significant findings. -hold lasix  today, consider transition to PO tomorrow -Strict I&O's, daily weights -Continue home metoprolol  tartrate 25 mg q8h, amlodipine  5 mg daily -Delirium precautions -Fall precautions -AM BMP, Mag -Goal K > 4, mag > 2 Dyspnea Remains comfortable on RA. See above plan.  -Continue plan as above to treat CHF -Continue home triple therapy -O2 supplementation if needed, goal 88-92% as pt has COPD -Encourage incentive spirometry AKI (acute kidney injury) (HCC) Patient with CKD IIIb.  Creatinine 2.43 > 2.61 >2.92. Will hold lasix  today and re-eval tomorrow. Foley replaced yesterday with dirty culture, consider treating for UTI if symptoms develop. -Hold nephrotoxic agents -Encourage PO intake -Follow-up urine culture and sensitivities -Monitor creatinine with daily BMP -May consider nephro consult while diuresing if Cr worsens Chronic health problem HTN: Continuing home antihypertensives, no changes to home antiHTN regimen unless significantly elevated in hospital, defer to pcp for continuing GDMT as tolerated. Paranoid schizophrenia: Continue home olanzapine  7.5 mg at bedtime, home  Depakote  250 mg BID. T2DM: Hold home tradjenda, CBGs TID, add back home meds at discharge. Sick sinus syndrome: Has pacemaker. Chronic hyponatremia: Continue home sodium bicarbonate  650 mg BID COPD: continue home Breztri 2 puffs BID Postpolio Syndrome: bedbound HLD: continue home pravastatin  40 mg daily Gout: held allopurinol  100 mg due to AKI - add back as indicated  FEN/GI: Heart healthy, carb modified. PPx: home eliquis  Dispo:Home pending clinical improvement .    Subjective:  Resting comfortably in bed. Expressed concerns over timing of medications, discussed that some medications from home were held because of her kidney injury, and others were timed differently due to unit work flow.  Objective: Temp:  [97.6 F (36.4 C)-98.9 F (37.2 C)] 98.1 F (36.7 C) (06/02 0800) Pulse Rate:  [62-70] 62 (06/02 0800) Resp:  [18-20] 18 (06/02 0800) BP: (129-157)/(57-119) 137/58 (06/02 0800) SpO2:  [95 %-99 %] 98 % (06/02 0800) Weight:  [143.9 kg] 143.9 kg (06/02 0436) Physical Exam: General: Well appearing, elderly female. No distress Cardiovascular: RRR, no M/R/G Respiratory: CTAB, no increased WOB Abdomen: Flat, soft, non-tender Extremities: minimal edema in the BL lower extremeties  Laboratory: Most recent CBC Lab Results  Component Value Date   WBC 11.5 (H) 06/14/2023   HGB 10.9 (L) 06/14/2023   HCT 32.0 (L) 06/14/2023   MCV 91.0 06/14/2023   PLT 199 06/14/2023   Most recent BMP    Latest Ref Rng & Units 06/17/2023    5:54 AM  BMP  Glucose 70 - 99 mg/dL 147   BUN 8 - 23 mg/dL 40   Creatinine 8.29 - 1.00 mg/dL 5.62   Sodium 130 - 865 mmol/L 136   Potassium 3.5 - 5.1 mmol/L 3.9   Chloride 98 - 111 mmol/L 106   CO2  22 - 32 mmol/L 17   Calcium  8.9 - 10.3 mg/dL 9.9     Rayma Calandra, DO 06/17/2023, 9:14 AM  PGY-1, [facility] Family Medicine FPTS Intern pager: [phone], text pages welcome Secure chat group [facility] Teaching Service

## 2023-06-17 NOTE — Assessment & Plan Note (Signed)
 Dry weight appears to be ~300lbs. Net output not yet calculated since yesterday. Creatinine elevated from yesterday, will hold lasix  and plan to transition to PO tomorrow if appropriate. Echo completed, poor quality due to patient non-compliance with positioning, but no significant findings. -hold lasix  today, consider transition to PO tomorrow -Strict I&O's, daily weights -Continue home metoprolol  tartrate 25 mg q8h, amlodipine  5 mg daily -Delirium precautions -Fall precautions -AM BMP, Mag -Goal K > 4, mag > 2

## 2023-06-17 NOTE — Progress Notes (Signed)
 FMTS PCP Note In to see patient. She is doing well, concerns about potassium addressed. All questions answered. Plan per Dr. Kassie Pais note. Very much appreciate FMTS care of this patient.  Otho Blitz, MD  Family Medicine Teaching Service

## 2023-06-17 NOTE — Assessment & Plan Note (Signed)
 Remains comfortable on RA. See above plan.  -Continue plan as above to treat CHF -Continue home triple therapy -O2 supplementation if needed, goal 88-92% as pt has COPD -Encourage incentive spirometry

## 2023-06-17 NOTE — Progress Notes (Signed)
 Mobility Specialist: Progress Note   06/17/23 1259  Mobility  Activity Turned to right side;Turned to left side;Dangled on edge of bed  Level of Assistance +2 (takes two people)  Activity Response Tolerated fair  Mobility visit 1 Mobility  Mobility Specialist Start Time (ACUTE ONLY) 1025  Mobility Specialist Stop Time (ACUTE ONLY) 1033  Mobility Specialist Time Calculation (min) (ACUTE ONLY) 8 min    Pt received on EOB. MaxA+2 for sit>supine and rolling. TotA+2 to slide up in the bed. Left in bed with all needs met, call bell in reach.   Deloria Fetch Mobility Specialist Please contact via SecureChat or Rehab office at [phone]

## 2023-06-17 NOTE — Assessment & Plan Note (Addendum)
 Patient with CKD IIIb.  Creatinine 2.43 > 2.61 >2.92. Will hold lasix  today and re-eval tomorrow. Foley replaced yesterday with dirty culture, consider treating for UTI if symptoms develop. -Hold nephrotoxic agents -Encourage PO intake -Follow-up urine culture and sensitivities -Monitor creatinine with daily BMP -May consider nephro consult while diuresing if Cr worsens

## 2023-06-18 LAB — BASIC METABOLIC PANEL WITH GFR
Anion gap: 7 (ref 5–15)
BUN: 44 mg/dL — ABNORMAL HIGH (ref 8–23)
CO2: 21 mmol/L — ABNORMAL LOW (ref 22–32)
Calcium: 9.7 mg/dL (ref 8.9–10.3)
Chloride: 109 mmol/L (ref 98–111)
Creatinine, Ser: 2.83 mg/dL — ABNORMAL HIGH (ref 0.44–1.00)
GFR, Estimated: 16 mL/min — ABNORMAL LOW (ref >60)
Glucose, Bld: 150 mg/dL — ABNORMAL HIGH (ref 70–99)
Potassium: 4.4 mmol/L (ref 3.5–5.1)
Sodium: 137 mmol/L (ref 135–145)

## 2023-06-18 LAB — GLUCOSE, CAPILLARY
Glucose-Capillary: 126 mg/dL — ABNORMAL HIGH (ref 70–99)
Glucose-Capillary: 148 mg/dL — ABNORMAL HIGH (ref 70–99)

## 2023-06-18 LAB — MAGNESIUM: Magnesium: 2.1 mg/dL (ref 1.7–2.4)

## 2023-06-18 NOTE — Assessment & Plan Note (Signed)
 Net output to date 7.4 L.  Held Lasix  yesterday due to rising creatinine. -hold lasix  today, consider transition to PO tomorrow -Strict I&O's, daily weights -Continue home metoprolol  tartrate 25 mg q8h, amlodipine  5 mg daily -Delirium precautions -Fall precautions -AM BMP, Mag -Goal K > 4, mag > 2

## 2023-06-18 NOTE — Assessment & Plan Note (Signed)
 Remains comfortable on RA. See above plan.  -Continue plan as above to treat CHF -Continue home triple therapy -O2 supplementation if needed, goal 88-92% as pt has COPD -Encourage incentive spirometry

## 2023-06-18 NOTE — Plan of Care (Signed)

## 2023-06-18 NOTE — Care Management Important Message (Signed)
 Important Message  Patient Details  Name: Abigail Wallace MRN: [id number] Date of Birth: 1945-03-28   Important Message Given:  Yes - Medicare IM     [name] 06/18/2023, 3:00 PM

## 2023-06-18 NOTE — Assessment & Plan Note (Signed)
 HTN: Continuing home antihypertensives, no changes to home antiHTN regimen unless significantly elevated in hospital, defer to pcp for continuing GDMT as tolerated. Paranoid schizophrenia: Continue home olanzapine  7.5 mg at bedtime, home Depakote  250 mg BID. T2DM: Hold home tradjenda, CBGs TID, add back home meds at discharge. Sick sinus syndrome: Has pacemaker. Chronic hyponatremia: Continue home sodium bicarbonate  650 mg BID COPD: continue home Breztri 2 puffs BID Postpolio Syndrome: bedbound HLD: continue home pravastatin  40 mg daily Gout: held allopurinol  100 mg due to AKI - add back as indicated

## 2023-06-18 NOTE — Progress Notes (Signed)
 Daily Progress Note Intern Pager: [phone]  Patient name: Abigail Wallace Medical record number: [id number] Date of birth: 1945/05/13 Age: 78 y.o. Gender: female  Primary Care Provider: Azell Boll, MD Consultants: None Code Status: Full  Pt Overview and Major Events to Date:  5/30: Admitted  Assessment and Plan:  This is an 78 year old female patient presenting with suspected CHF exacerbation found to have an AKI.  At this time she is euvolemic and her AKI is improving. Assessment & Plan Acute on chronic combined systolic and diastolic heart failure (HCC) Net output to date 7.4 L.  Held Lasix  yesterday due to rising creatinine. -hold lasix  today, consider transition to PO tomorrow -Strict I&O's, daily weights -Continue home metoprolol  tartrate 25 mg q8h, amlodipine  5 mg daily -Delirium precautions -Fall precautions -AM BMP, Mag -Goal K > 4, mag > 2 Dyspnea Remains comfortable on RA. See above plan.  -Continue plan as above to treat CHF -Continue home triple therapy -O2 supplementation if needed, goal 88-92% as pt has COPD -Encourage incentive spirometry AKI (acute kidney injury) (HCC) Patient with CKD IIIb.  Creatinine 2.43 > 2.61 >2.92 > 2.83.  Baseline creatinine appears closer to 2.0.  After discussion patient appears to be colonized rather than having an acute UTI so we will hold off on antibiotic treatment. -Hold nephrotoxic agents -Encourage PO intake -Follow-up urine culture and sensitivities -Monitor creatinine with daily BMP Chronic health problem HTN: Continuing home antihypertensives, no changes to home antiHTN regimen unless significantly elevated in hospital, defer to pcp for continuing GDMT as tolerated. Paranoid schizophrenia: Continue home olanzapine  7.5 mg at bedtime, home Depakote  250 mg BID. T2DM: Hold home tradjenda, CBGs TID, add back home meds at discharge. Sick sinus syndrome: Has pacemaker. Chronic hyponatremia: Continue home sodium  bicarbonate 650 mg BID COPD: continue home Breztri 2 puffs BID Postpolio Syndrome: bedbound HLD: continue home pravastatin  40 mg daily Gout: held allopurinol  100 mg due to AKI - add back as indicated  FEN/GI: Heart healthy carb modified PPx: Eliquis  Dispo:Home today or tomorrow.   Subjective:  Patient reports feeling well today, she is ready to go home  Objective: Temp:  [97.8 F (36.6 C)-98.7 F (37.1 C)] 97.8 F (36.6 C) (06/03 0447) Pulse Rate:  [61-72] 61 (06/03 0447) Resp:  [16-18] 16 (06/03 0447) BP: (128-152)/(56-62) 149/62 (06/03 0447) SpO2:  [95 %-100 %] 96 % (06/03 0447) Weight:  [144.4 kg] 144.4 kg (06/03 0450) Physical Exam: General: Elderly woman, no distress Cardiovascular: RRR, no m/r/g Respiratory: CTAB, no increased WOB Abdomen: flat, soft, nontender Extremities: 2+ pulses bilaterally  Laboratory: Most recent CBC Lab Results  Component Value Date   WBC 11.5 (H) 06/14/2023   HGB 10.9 (L) 06/14/2023   HCT 32.0 (L) 06/14/2023   MCV 91.0 06/14/2023   PLT 199 06/14/2023   Most recent BMP    Latest Ref Rng & Units 06/18/2023    5:17 AM  BMP  Glucose 70 - 99 mg/dL 147   BUN 8 - 23 mg/dL 44   Creatinine 8.29 - 1.00 mg/dL 5.62   Sodium 130 - 865 mmol/L 137   Potassium 3.5 - 5.1 mmol/L 4.4   Chloride 98 - 111 mmol/L 109   CO2 22 - 32 mmol/L 21   Calcium  8.9 - 10.3 mg/dL 9.7     Abigail Calandra, DO 06/18/2023, 7:16 AM  PGY-1, [facility] FPTS Intern pager: [phone], text pages welcome Secure chat group [facility] Teaching Service

## 2023-06-18 NOTE — Assessment & Plan Note (Signed)
 Patient with CKD IIIb.  Creatinine 2.43 > 2.61 >2.92 > 2.83.  Baseline creatinine appears closer to 2.0.  After discussion patient appears to be colonized rather than having an acute UTI so we will hold off on antibiotic treatment. -Hold nephrotoxic agents -Encourage PO intake -Follow-up urine culture and sensitivities -Monitor creatinine with daily BMP

## 2023-06-19 ENCOUNTER — Other Ambulatory Visit (HOSPITAL_COMMUNITY): Payer: Self-pay

## 2023-06-19 ENCOUNTER — Encounter: Payer: Self-pay | Admitting: Family Medicine

## 2023-06-19 DIAGNOSIS — I5043 Acute on chronic combined systolic (congestive) and diastolic (congestive) heart failure: Secondary | ICD-10-CM | POA: Diagnosis not present

## 2023-06-19 DIAGNOSIS — N1832 Chronic kidney disease, stage 3b: Secondary | ICD-10-CM | POA: Diagnosis not present

## 2023-06-19 DIAGNOSIS — E119 Type 2 diabetes mellitus without complications: Secondary | ICD-10-CM

## 2023-06-19 LAB — BASIC METABOLIC PANEL WITH GFR
Anion gap: 8 (ref 5–15)
BUN: 44 mg/dL — ABNORMAL HIGH (ref 8–23)
CO2: 19 mmol/L — ABNORMAL LOW (ref 22–32)
Calcium: 9.8 mg/dL (ref 8.9–10.3)
Chloride: 110 mmol/L (ref 98–111)
Creatinine, Ser: 2.61 mg/dL — ABNORMAL HIGH (ref 0.44–1.00)
GFR, Estimated: 18 mL/min — ABNORMAL LOW (ref >60)
Glucose, Bld: 125 mg/dL — ABNORMAL HIGH (ref 70–99)
Potassium: 4.4 mmol/L (ref 3.5–5.1)
Sodium: 137 mmol/L (ref 135–145)

## 2023-06-19 LAB — GLUCOSE, CAPILLARY
Glucose-Capillary: 120 mg/dL — ABNORMAL HIGH (ref 70–99)
Glucose-Capillary: 181 mg/dL — ABNORMAL HIGH (ref 70–99)

## 2023-06-19 LAB — MAGNESIUM: Magnesium: 2.3 mg/dL (ref 1.7–2.4)

## 2023-06-19 MED ORDER — FUROSEMIDE 40 MG PO TABS
60.0000 mg | ORAL_TABLET | Freq: Every day | ORAL | 0 refills | Status: DC
  Filled 2023-06-19: qty 45, 30d supply, fill #0

## 2023-06-19 NOTE — Progress Notes (Signed)
 Mobility Specialist: Progress Note   06/19/23 1233  Mobility  Activity Dangled on edge of bed  Level of Assistance +2 (takes two people)  Activity Response Tolerated fair  Mobility visit 1 Mobility  Mobility Specialist Start Time (ACUTE ONLY) 0945  Mobility Specialist Stop Time (ACUTE ONLY) 0948  Mobility Specialist Time Calculation (min) (ACUTE ONLY) 3 min    Pt received in bed requesting to sit EOB to eat. MaxA+2 to scoot EOB and for trunk elevation. Left on EOB with all needs met, call bell in reach. NT present.   Abigail Wallace Mobility Specialist Please contact via SecureChat or Rehab office at [phone]

## 2023-06-19 NOTE — Discharge Summary (Signed)
 Family Medicine Teaching [facility] Discharge Summary  Patient name: Abigail Wallace Medical record number: [id number] Date of birth: 08-02-1945 Age: 78 y.o. Gender: female Date of Admission: 06/14/2023  Date of Discharge: 06/19/2023 Admitting Physician: Rayma Calandra, DO  Primary Care Provider: Azell Boll, MD Consultants: None  Indication for Hospitalization: Acute on Chronic CHF exacerbation  Discharge Diagnoses/Problem List:  Principal Problem for Admission: CHF exacerbation Other Problems addressed during stay:  Principal Problem:   Acute on chronic combined systolic and diastolic heart failure (HCC) Active Problems:   Sick sinus syndrome (HCC)   CKD stage IIIb   Dependence on wheelchair   Dyspnea   AKI (acute kidney injury) (HCC)   Chronic health problem   COPD (chronic obstructive pulmonary disease) (HCC)   Postpolio syndrome    Brief Hospital Course:  Abigail Wallace is a 78 y.o.female with a history of CHF, COPD, T2DM, HTN, sick sinus syndrome with pacemaker, CKD 3b, paranoid schizophrenia who was admitted to the [facility] Medicine Teaching Service at [facility] for dyspnea.   Her hospital course is detailed below:  CHF exacerbation  dyspnea In the ED labs revealed minimally elevated troponins, BNP elevated to 549.  CXR with vascular congestion, bilateral basilar infiltrates, and pleural effusions.  Estimated 20 pounds above dry weight on admission.  She did receive 40 mg IV Lasix  in the ED and diuresed 3.5L initially.  Echocardiogram obtained which showed EF of 55-60%, however the study was suboptimal.  Repeat CXR obtained and showed pulmonary vascular congestion and costovertebral blunting consistent with pulmonary edema and small effusion.  CT chest was obtained which showed a stable nodule in the left lower lobe, previously noted in 2021, but no acute changes. In total, the patient diuresed 7.4 L and was euvolemic on discharge. Her home diuretic was increased to 60  mg daily.   AKI Creatinine on admission elevated to 2.8, baseline appears to be between 1.6-2.0.  She maintained good PO intake and creatinine did come down despite aggressive diuresis. Her foley catheter was replaced during her admission, and cultures grew klebsiella, which the patient is colonized with. No treatment for UTI was needed this hospitalization.   Other chronic conditions were medically managed with home medications and formulary alternatives as necessary (HTN, paranoid schizophrenia, T2DM, sick sinus syndrome, chronic hyponatremia, COPD, postpolio syndrome, HLD, gout)    PCP Follow-up Recommendations: Decrease home lasix  as appropriate Repeat BMP to monitor creatinine outpatient.    Disposition: Home  Discharge Condition: Stable  Discharge Exam:  Vitals:   06/19/23 0416 06/19/23 0757  BP: (!) 149/57 (!) 146/92  Pulse: 60 (!) 56  Resp: 20 19  Temp: 98.3 F (36.8 C) 98.5 F (36.9 C)  SpO2: 96% 97%   General: A&O, NAD HEENT: No sign of trauma, EOM grossly intact Cardiac: RRR, no m/r/g Respiratory: CTAB, normal WOB, no w/c/r GI: Soft, NTTP, non-distended  Extremities: NTTP, no peripheral edema.  Significant Procedures: none  Significant Labs and Imaging:  No results for input(s): "WBC", "HGB", "HCT", "PLT" in the last 48 hours. Recent Labs  Lab 06/18/23 0517 06/19/23 0543  NA 137 137  K 4.4 4.4  CL 109 110  CO2 21* 19*  GLUCOSE 150* 125*  BUN 44* 44*  CREATININE 2.83* 2.61*  CALCIUM  9.7 9.8  MG 2.1 2.3    Results/Tests Pending at Time of Discharge: none  Discharge Medications:  Allergies as of 06/19/2023       Reactions   Aripiprazole Anaphylaxis, Other (See Comments),  Shortness Of Breath   Tremors   Clozapine  Anaphylaxis   Haloperidol  Lactate Other (See Comments)   Severe tardive dyskinesias, somnolence, speech change    Keflex  [cephalexin ] Swelling, Other (See Comments)   Per patient, has tongue swelling with keflex  but can tolerate  amoxicillin    Benadryl [diphenhydramine Hcl] Other (See Comments)   States affects her vision   Benztropine  Other (See Comments)   Affects mobility   Latuda [lurasidone Hcl] Other (See Comments)   "Tremors and shakes."    Lorazepam  Other (See Comments)   Unknown reaction??   Remeron [mirtazapine] Other (See Comments)   Tremors and shakes   Codeine Other (See Comments)   Unknown reaction??   Latex Rash        Medication List     PAUSE taking these medications    allopurinol  100 MG tablet Wait to take this until your doctor or other care provider tells you to start again. Commonly known as: ZYLOPRIM  Take 1 tablet (100 mg total) by mouth daily.       STOP taking these medications    AZO Cranberry Urinary Tract 250-60 MG Caps Generic drug: Cranberry-Vitamin C       TAKE these medications    acetaminophen  500 MG tablet Commonly known as: TYLENOL  Take 500 mg by mouth every 6 (six) hours as needed for mild pain (pain score 1-3) or headache.   albuterol  108 (90 Base) MCG/ACT inhaler Commonly known as: VENTOLIN  HFA Inhale 2 puffs into the lungs every 6 (six) hours as needed for wheezing or shortness of breath.   amLODipine  5 MG tablet Commonly known as: NORVASC  Take 1 tablet (5 mg total) by mouth at bedtime. What changed: when to take this   apixaban  2.5 MG Tabs tablet Commonly known as: ELIQUIS  Take 1 tablet (2.5 mg total) by mouth 2 (two) times daily.   cholecalciferol  25 MCG (1000 UNIT) tablet Commonly known as: VITAMIN D3 Take 1 tablet (1,000 Units total) by mouth daily.   diclofenac  Sodium 1 % Gel Commonly known as: Voltaren  Apply 2 g topically 4 (four) times daily as needed (pain).   divalproex  250 MG DR tablet Commonly known as: DEPAKOTE  Take 1 tablet (250 mg total) by mouth every 12 (twelve) hours. What changed: when to take this   furosemide  40 MG tablet Commonly known as: Lasix  Take 1.5 tablets (60 mg total) by mouth daily. What changed: how  much to take   ipratropium-albuterol  0.5-2.5 (3) MG/3ML Soln Commonly known as: DUONEB Take 3 mLs by nebulization every 4 (four) hours as needed.   linagliptin  5 MG Tabs tablet Commonly known as: Tradjenta  Take 1 tablet (5 mg total) by mouth daily. What changed: when to take this   metoprolol  tartrate 100 MG tablet Commonly known as: LOPRESSOR  Take 100 mg by mouth 3 (three) times daily.   multivitamin with minerals Tabs tablet Take 1 tablet by mouth daily.   nitroGLYCERIN  0.4 MG SL tablet Commonly known as: NITROSTAT  Place 1 tablet (0.4 mg total) under the tongue every 5 (five) minutes as needed for chest pain.   nystatin  cream Commonly known as: MYCOSTATIN  Apply 1 Application topically daily as needed for dry skin (Rash).   nystatin  powder Apply 1 Application topically daily. Apply after bathing.   OLANZapine  10 MG tablet Commonly known as: ZYPREXA  Take 7.5 mg by mouth at bedtime.   OVER THE COUNTER MEDICATION Apply 1 application  topically See admin instructions. Caldesene Medicated Protecting Powder- Apply under the breasts and between the  skin folds 2 times a day as needed for irritation   polyethylene glycol powder 17 GM/SCOOP powder Commonly known as: GLYCOLAX /MIRALAX  Take 17 g by mouth 3 (three) times daily.   pravastatin  40 MG tablet Commonly known as: PRAVACHOL  TAKE ONE TABLET BY MOUTH EVERY EVENING What changed: when to take this   senna 8.6 MG Tabs tablet Commonly known as: SENOKOT Take 1 tablet (8.6 mg total) by mouth daily as needed for mild constipation. What changed: when to take this   sodium bicarbonate  650 MG tablet Take 1 tablet (650 mg total) by mouth 2 (two) times daily.   Trelegy Ellipta  100-62.5-25 MCG/ACT Aepb Generic drug: Fluticasone -Umeclidin-Vilant Inhale 1 puff into the lungs daily. What changed: when to take this   triamcinolone  ointment 0.5 % Commonly known as: KENALOG  Apply 1 Application topically 2 (two) times daily. Apply  daily to R flank for 2 weeks What changed:  when to take this reasons to take this        Discharge Instructions: Please refer to Patient Instructions section of EMR for full details.  Patient was counseled important signs and symptoms that should prompt return to medical care, changes in medications, dietary instructions, activity restrictions, and follow up appointments.   Follow-Up Appointments:  Follow-up Information     [facility] [place], [place] Follow up.   Why: [name] will continue to provide home health services.  They will call you in the next 24-48 hours to set up services. Contact information: [address] [place] [address] [phone]               PCP aware of discharge and will reach out to schedule follow up.   Rayma Calandra, DO 06/19/2023, 11:27 AM PGY-1, [facility] Family [facility]

## 2023-06-19 NOTE — Plan of Care (Signed)

## 2023-06-19 NOTE — Progress Notes (Signed)
 Heart Failure Navigator Progress Note  Assessed for Heart & Vascular TOC clinic readiness.  Patient does not meet criteria due to EF 55-60%, per MD note will follow up with PCP. No HF TOC. .   Navigator will sign off at this time.   Randie Bustle, BSN, Scientist, clinical (histocompatibility and immunogenetics) Only

## 2023-06-19 NOTE — Progress Notes (Signed)
 Mobility Specialist: Progress Note   06/19/23 1234  Mobility  Activity Turned to right side;Turned to left side;Turned to back - supine  Level of Assistance +2 (takes two people)  Activity Response Tolerated fair  Mobility visit 1 Mobility  Mobility Specialist Start Time (ACUTE ONLY) 1054  Mobility Specialist Stop Time (ACUTE ONLY) 1100  Mobility Specialist Time Calculation (min) (ACUTE ONLY) 6 min    Pt needed to be adjusted in bed. TotA+2 to roll to each side and slide up in bed. Left in bed with all needs met, call bell in reach.   Deloria Fetch Mobility Specialist Please contact via SecureChat or Rehab office at [phone]

## 2023-06-19 NOTE — TOC Progression Note (Addendum)
 Transition of Care [facility]) - Progression Note    Patient Details  Name: Abigail Wallace MRN: [id number] Date of Birth: [birth year]  [facility] CM/SW Contact  Dane Dung, RN Phone Number: 06/19/2023, 8:56 AM  Clinical Narrative:    CM spoke with attending physician team and patient is planned to discharge home today.  Patient's son requests transportation assistance to home.    I called and spoke with the patient's son [name] by phone and he requests PTAR to home.  PTAR will be arranged once discharge has been placed by attending MD Team.  Bedside nursing updated to please call the son when PTAR arrives for transportation to home so that he can assist patient at the home since he is at work during the day.  Patient is bed bound at baseline.  Patient will discharge later today by PTAR transport.  Patient is currently active with [facility] company for [facility] RN.  HH order placed and MD requested to co-sign order prior to discharge to home.  06/19/23 1153 - Discharge was placed by attending team.  PTAR was called and I asked bedside to to call the son when she leaves the hospital for home.  PTAR should arrive to pick patient up in the next 1-1.5 hours.   Bedside nursing updated.       Expected Discharge Plan and Services                                               Social Determinants of Health (SDOH) Interventions SDOH Screenings   Food Insecurity: No Food Insecurity (06/15/2023)  Housing: Low Risk  (06/15/2023)  Transportation Needs: No Transportation Needs (06/15/2023)  Utilities: Not At Risk (06/15/2023)  Alcohol  Screen: Low Risk  (12/27/2020)  Depression (PHQ2-9): High Risk (02/22/2022)  Financial Resource Strain: Low Risk  (03/23/2022)  Physical Activity: Inactive (12/27/2020)  Social Connections: Socially Isolated (06/15/2023)  Stress: No Stress Concern Present (12/27/2020)  Tobacco Use: Medium Risk (06/14/2023)    Readmission Risk Interventions      No data to display

## 2023-06-19 NOTE — Discharge Instructions (Addendum)
 Dear Abigail Wallace,   Thank you for letting us  participate in your care! In this section, you will find a brief hospital admission summary of why you were admitted to the hospital, what happened during your admission, your diagnosis/diagnoses, and recommended follow up.  Primary diagnosis: CHF exacerbation Treatment plan: restart home lasix , follow up with primary care Secondary diagnosis: Acute Kidney injury Treatment plan: we replaced your foley catheter, please make sure you drink plenty of water .   POST-HOSPITAL & CARE INSTRUCTIONS We recommend following up with your PCP within 1 week from being discharged from the hospital. Please let PCP/Specialists know of any changes in medications that were made which you will be able to see in the medications section of this packet.  DOCTOR'S APPOINTMENTS & FOLLOW UP Future Appointments  Date Time Provider Department Center  06/24/2023  2:30 PM Jonne Netters, MD [facility]  07/01/2023  7:05 AM CVD HVT DEVICE REMOTES [facility]  09/30/2023  7:15 AM [facility] HVT DEVICE REMOTES [facility]  12/30/2023  7:05 AM [facility] HVT DEVICE REMOTES [facility]  03/30/2024  7:05 AM [facility] HVT DEVICE REMOTES [facility]  06/29/2024  7:05 AM [facility] HVT DEVICE REMOTES [facility]  09/28/2024  7:05 AM CVD HVT DEVICE REMOTES [facility]  12/28/2024  7:05 AM [facility] HVT DEVICE REMOTES [facility]     Thank you for choosing [facility]! Take care and be well!  Family Medicine Teaching Service Inpatient Team [place]  [facility]  [address] [place], [place] [address] [phone]

## 2023-06-20 ENCOUNTER — Telehealth: Payer: Self-pay

## 2023-06-20 NOTE — Telephone Encounter (Signed)
 Spoke with [name].  Discussed lasix  dosing and holding allopurinol . Discussed Trajenta.  He reports his main concern is discontinuing Azo. He reports this has more of a "placebo effect" on her.   He reports he would like to continue giving her this medication, however will discontinue if PCP feels this is best.   Advised will forward to PCP.     [name] requests a mychart response.

## 2023-06-20 NOTE — Telephone Encounter (Signed)
 Called [name].   He reports he is currently in the Dentist chair and requests I call him back later.   Please advise on Trajenta in the meantime?

## 2023-06-20 NOTE — Telephone Encounter (Signed)
 Please advise should continue Trajenta   Otho Blitz, MD  [facility] Medicine Teaching Service

## 2023-06-20 NOTE — Transitions of Care (Post Inpatient/ED Visit) (Addendum)
   06/20/2023  Name: Abigail Wallace MRN: [id number] DOB: Nov 16, 1945  Today's TOC FU Call Status: Post discharge TOC outreach attempt #1 Unsuccessful.     Attempted to reach the patient regarding the most recent Inpatient/ED visit. Per DPR reached out to son, Darcella Earnest, via cell phone and left generic confidential voice mail and this was not an emergency call as son is likely at work.   Follow Up Plan: Additional outreach attempts will be made to reach the patient to complete the Transitions of Care (Post Inpatient/ED visit) call.    Katheryn Pandy MSN, RN RN Case Sales executive Health  [facility]Population [facility] Office Hours M-F [phone] Direct Dial: [phone] Main Phone [phone]  Fax: [phone] [email or web address]

## 2023-06-21 ENCOUNTER — Telehealth: Payer: Self-pay

## 2023-06-21 DIAGNOSIS — J441 Chronic obstructive pulmonary disease with (acute) exacerbation: Secondary | ICD-10-CM | POA: Diagnosis not present

## 2023-06-21 DIAGNOSIS — N1832 Chronic kidney disease, stage 3b: Secondary | ICD-10-CM | POA: Diagnosis not present

## 2023-06-21 DIAGNOSIS — E1122 Type 2 diabetes mellitus with diabetic chronic kidney disease: Secondary | ICD-10-CM | POA: Diagnosis not present

## 2023-06-21 DIAGNOSIS — I5043 Acute on chronic combined systolic (congestive) and diastolic (congestive) heart failure: Secondary | ICD-10-CM | POA: Diagnosis not present

## 2023-06-21 DIAGNOSIS — J440 Chronic obstructive pulmonary disease with acute lower respiratory infection: Secondary | ICD-10-CM | POA: Diagnosis not present

## 2023-06-21 DIAGNOSIS — I130 Hypertensive heart and chronic kidney disease with heart failure and stage 1 through stage 4 chronic kidney disease, or unspecified chronic kidney disease: Secondary | ICD-10-CM | POA: Diagnosis not present

## 2023-06-21 NOTE — Transitions of Care (Post Inpatient/ED Visit) (Signed)
   06/21/2023  Name: Abigail Wallace MRN: [id number] DOB: 1945/10/02  Today's TOC FU Call Status: Today's TOC FU Call Status:: Unsuccessful Call (2nd Attempt) Unsuccessful Call (2nd Attempt) Date: 06/21/23  Attempted to reach the patient regarding the most recent Inpatient/ED visit.  Note: RN CM did call [name] at [phone], and confirm with [facility] of contact with patient post discharge and that RN was currently at patient's location right now. (150 pm EST).  It was also noted on chart review prior to outreach attempt that patient's son, [name], has had phone encounters with PCP office since discharge regarding some medications questions that were answered by PCP.   Follow Up Plan: Additional outreach attempts will be made to reach the patient to complete the Transitions of Care (Post Inpatient/ED visit) call.    Katheryn Pandy MSN, RN RN Case Sales executive Health  [facility]Population Health Office Hours M-F [phone] Direct Dial: [phone] Main Phone [phone]  Fax: [phone] [email or web address]

## 2023-06-24 ENCOUNTER — Telehealth: Payer: Self-pay

## 2023-06-24 ENCOUNTER — Inpatient Hospital Stay: Payer: Self-pay | Admitting: Family Medicine

## 2023-06-24 NOTE — Telephone Encounter (Signed)
 Kiki from [employer] calling for nursing verbal orders as follows:  2 time(s) weekly for 2 week(s), then 1 time(s) weekly for 6 week(s)  Verbal orders given per [facility] protocol  Elsie Halo, RN

## 2023-06-24 NOTE — Transitions of Care (Post Inpatient/ED Visit) (Signed)
   06/24/2023  Name: Abigail Wallace MRN: [id number] DOB: 12-25-1945  Today's TOC FU Call Status: Today's TOC FU Call Status:: Unsuccessful Call (3rd Attempt) Unsuccessful Call (3rd Attempt) Date: 06/24/23  Attempted to reach the patient regarding the most recent Inpatient/ED visit. Attempted to reach son, Abigail Wallace, at [phone]. Of note on chart review for this outreach, patient or son had canceled PCP appointment scheduled for today due to "no transportation". No SDOH alerts on transportation on admission. RN CM had left a VM on prior attempts.   Follow Up Plan: No further outreach attempts will be made at this time. We have been unable to contact the patient.   Katheryn Pandy MSN, RN RN Case Sales executive Health  [facility]Population [facility] Office Hours M-F [phone] Direct Dial: [phone] Main Phone [phone]  Fax: [phone] [email or web address]

## 2023-06-25 DIAGNOSIS — J440 Chronic obstructive pulmonary disease with acute lower respiratory infection: Secondary | ICD-10-CM | POA: Diagnosis not present

## 2023-06-25 DIAGNOSIS — E1122 Type 2 diabetes mellitus with diabetic chronic kidney disease: Secondary | ICD-10-CM | POA: Diagnosis not present

## 2023-06-25 DIAGNOSIS — I5043 Acute on chronic combined systolic (congestive) and diastolic (congestive) heart failure: Secondary | ICD-10-CM | POA: Diagnosis not present

## 2023-06-25 DIAGNOSIS — J441 Chronic obstructive pulmonary disease with (acute) exacerbation: Secondary | ICD-10-CM | POA: Diagnosis not present

## 2023-06-25 DIAGNOSIS — I130 Hypertensive heart and chronic kidney disease with heart failure and stage 1 through stage 4 chronic kidney disease, or unspecified chronic kidney disease: Secondary | ICD-10-CM | POA: Diagnosis not present

## 2023-06-25 DIAGNOSIS — N1832 Chronic kidney disease, stage 3b: Secondary | ICD-10-CM | POA: Diagnosis not present

## 2023-06-26 ENCOUNTER — Encounter: Payer: Self-pay | Admitting: Cardiovascular Disease

## 2023-06-26 ENCOUNTER — Other Ambulatory Visit: Payer: Self-pay

## 2023-06-28 DIAGNOSIS — J441 Chronic obstructive pulmonary disease with (acute) exacerbation: Secondary | ICD-10-CM | POA: Diagnosis not present

## 2023-06-28 DIAGNOSIS — E1122 Type 2 diabetes mellitus with diabetic chronic kidney disease: Secondary | ICD-10-CM | POA: Diagnosis not present

## 2023-06-28 DIAGNOSIS — I5043 Acute on chronic combined systolic (congestive) and diastolic (congestive) heart failure: Secondary | ICD-10-CM | POA: Diagnosis not present

## 2023-06-28 DIAGNOSIS — J440 Chronic obstructive pulmonary disease with acute lower respiratory infection: Secondary | ICD-10-CM | POA: Diagnosis not present

## 2023-06-28 DIAGNOSIS — N1832 Chronic kidney disease, stage 3b: Secondary | ICD-10-CM | POA: Diagnosis not present

## 2023-06-28 DIAGNOSIS — I130 Hypertensive heart and chronic kidney disease with heart failure and stage 1 through stage 4 chronic kidney disease, or unspecified chronic kidney disease: Secondary | ICD-10-CM | POA: Diagnosis not present

## 2023-07-01 ENCOUNTER — Ambulatory Visit (INDEPENDENT_AMBULATORY_CARE_PROVIDER_SITE_OTHER): Payer: Medicare Other

## 2023-07-01 ENCOUNTER — Encounter: Payer: Self-pay | Admitting: Cardiovascular Disease

## 2023-07-01 ENCOUNTER — Telehealth: Payer: Self-pay

## 2023-07-01 DIAGNOSIS — S21109A Unspecified open wound of unspecified front wall of thorax without penetration into thoracic cavity, initial encounter: Secondary | ICD-10-CM | POA: Diagnosis not present

## 2023-07-01 DIAGNOSIS — I442 Atrioventricular block, complete: Secondary | ICD-10-CM

## 2023-07-01 DIAGNOSIS — R338 Other retention of urine: Secondary | ICD-10-CM | POA: Diagnosis not present

## 2023-07-01 LAB — CUP PACEART REMOTE DEVICE CHECK
Battery Impedance: 1720 Ohm
Battery Remaining Longevity: 44 mo
Battery Voltage: 2.76 V
Brady Statistic RV Percent Paced: 100 %
Date Time Interrogation Session: 20250616062128
Implantable Lead Connection Status: 753985
Implantable Lead Connection Status: 753985
Implantable Lead Implant Date: 20070414
Implantable Lead Implant Date: 20070914
Implantable Lead Location: 753859
Implantable Lead Location: 753860
Implantable Lead Model: 4092
Implantable Lead Model: 5594
Implantable Pulse Generator Implant Date: 20160816
Lead Channel Impedance Value: 627 Ohm
Lead Channel Impedance Value: 67 Ohm
Lead Channel Pacing Threshold Amplitude: 1.125 V
Lead Channel Pacing Threshold Pulse Width: 0.4 ms
Lead Channel Setting Pacing Amplitude: 2.5 V
Lead Channel Setting Pacing Pulse Width: 0.4 ms
Lead Channel Setting Sensing Sensitivity: 4 mV
Zone Setting Status: 755011
Zone Setting Status: 755011

## 2023-07-01 NOTE — Telephone Encounter (Signed)
 Spoke with [name] regarding request for letter from Dr. Bevin Bucks for [employer].   They need letter from PCP stating medical necessity that power should not be cut off.   [name] reports that patient still uses oxygen  as needed that is connected with the electricity.   Will forward request to PCP.   I also advised that [employer] may have specific form that needs to be completed. [name] asks that we start with the letter from Dr. Bevin Bucks.   Elsie Halo, RN

## 2023-07-02 ENCOUNTER — Telehealth: Payer: Self-pay

## 2023-07-02 DIAGNOSIS — N1832 Chronic kidney disease, stage 3b: Secondary | ICD-10-CM | POA: Diagnosis not present

## 2023-07-02 DIAGNOSIS — E1122 Type 2 diabetes mellitus with diabetic chronic kidney disease: Secondary | ICD-10-CM | POA: Diagnosis not present

## 2023-07-02 DIAGNOSIS — J440 Chronic obstructive pulmonary disease with acute lower respiratory infection: Secondary | ICD-10-CM | POA: Diagnosis not present

## 2023-07-02 DIAGNOSIS — J441 Chronic obstructive pulmonary disease with (acute) exacerbation: Secondary | ICD-10-CM | POA: Diagnosis not present

## 2023-07-02 DIAGNOSIS — I130 Hypertensive heart and chronic kidney disease with heart failure and stage 1 through stage 4 chronic kidney disease, or unspecified chronic kidney disease: Secondary | ICD-10-CM | POA: Diagnosis not present

## 2023-07-02 DIAGNOSIS — I5043 Acute on chronic combined systolic (congestive) and diastolic (congestive) heart failure: Secondary | ICD-10-CM | POA: Diagnosis not present

## 2023-07-02 MED ORDER — BLOOD GLUCOSE TEST STRIPS 333 VI STRP: 1.0000 | ORAL_STRIP | 3 refills | Status: DC

## 2023-07-02 MED ORDER — LANCET DEVICE MISC: 1.0000 | 3 refills | Status: DC

## 2023-07-02 NOTE — Telephone Encounter (Signed)
 Letter sent via My Chart.

## 2023-07-02 NOTE — Telephone Encounter (Signed)
 Patient LVM on nurse line requesting a refill on DM testing supplies.   It appears strips and lancets are set to refill tomorrow to [facility].  Call [name]. He reports the no longer use [facility] due to insurance purposes. He asks we send to [facility].   I called [facility] and cancelled prescriptions.   Resent to [facility], per request.

## 2023-07-04 ENCOUNTER — Other Ambulatory Visit: Payer: Self-pay | Admitting: Family Medicine

## 2023-07-04 DIAGNOSIS — J811 Chronic pulmonary edema: Secondary | ICD-10-CM | POA: Diagnosis not present

## 2023-07-06 DIAGNOSIS — M179 Osteoarthritis of knee, unspecified: Secondary | ICD-10-CM | POA: Diagnosis not present

## 2023-07-06 DIAGNOSIS — J449 Chronic obstructive pulmonary disease, unspecified: Secondary | ICD-10-CM | POA: Diagnosis not present

## 2023-07-06 DIAGNOSIS — I951 Orthostatic hypotension: Secondary | ICD-10-CM | POA: Diagnosis not present

## 2023-07-07 ENCOUNTER — Encounter: Payer: Self-pay | Admitting: Family Medicine

## 2023-07-08 DIAGNOSIS — E1122 Type 2 diabetes mellitus with diabetic chronic kidney disease: Secondary | ICD-10-CM | POA: Diagnosis not present

## 2023-07-08 DIAGNOSIS — N1832 Chronic kidney disease, stage 3b: Secondary | ICD-10-CM | POA: Diagnosis not present

## 2023-07-08 DIAGNOSIS — J441 Chronic obstructive pulmonary disease with (acute) exacerbation: Secondary | ICD-10-CM | POA: Diagnosis not present

## 2023-07-08 DIAGNOSIS — I5043 Acute on chronic combined systolic (congestive) and diastolic (congestive) heart failure: Secondary | ICD-10-CM | POA: Diagnosis not present

## 2023-07-08 DIAGNOSIS — J440 Chronic obstructive pulmonary disease with acute lower respiratory infection: Secondary | ICD-10-CM | POA: Diagnosis not present

## 2023-07-08 DIAGNOSIS — I130 Hypertensive heart and chronic kidney disease with heart failure and stage 1 through stage 4 chronic kidney disease, or unspecified chronic kidney disease: Secondary | ICD-10-CM | POA: Diagnosis not present

## 2023-07-08 MED ORDER — BLOOD GLUCOSE TEST STRIPS 333 VI STRP: ORAL_STRIP | 3 refills | Status: DC

## 2023-07-08 MED ORDER — LANCET DEVICE MISC
3 refills | Status: DC
Start: 2023-07-08 — End: 2023-08-19

## 2023-07-08 NOTE — Addendum Note (Signed)
 Addended by: Arlyce Circle C on: 07/08/2023 11:30 AM   Modules accepted: Orders

## 2023-07-09 ENCOUNTER — Telehealth: Payer: Self-pay | Admitting: Family Medicine

## 2023-07-09 NOTE — Telephone Encounter (Addendum)
 Please call [facility] and ask for a: Hemoglobin A1C CBC  BMP  Urine culture to be obtained of FRESH, NEW foley placed next week  Week of June 30th  Thank you! Suzann Daring, MD  Family Medicine Teaching Service

## 2023-07-09 NOTE — Telephone Encounter (Signed)
 Called [place] and provided verbal orders to [place].   [name], RN

## 2023-07-10 ENCOUNTER — Ambulatory Visit: Payer: Self-pay | Admitting: Cardiovascular Disease

## 2023-07-11 NOTE — Telephone Encounter (Signed)
 Page/Hannah--could you ask [name] if they will do this once a week? If not, I am sure [name] will be able to do so.   Thanks CB

## 2023-07-15 ENCOUNTER — Encounter: Payer: Self-pay | Admitting: Family Medicine

## 2023-07-15 DIAGNOSIS — E119 Type 2 diabetes mellitus without complications: Secondary | ICD-10-CM

## 2023-07-15 DIAGNOSIS — E1122 Type 2 diabetes mellitus with diabetic chronic kidney disease: Secondary | ICD-10-CM | POA: Diagnosis not present

## 2023-07-15 DIAGNOSIS — J441 Chronic obstructive pulmonary disease with (acute) exacerbation: Secondary | ICD-10-CM | POA: Diagnosis not present

## 2023-07-15 DIAGNOSIS — I5043 Acute on chronic combined systolic (congestive) and diastolic (congestive) heart failure: Secondary | ICD-10-CM | POA: Diagnosis not present

## 2023-07-15 DIAGNOSIS — N1832 Chronic kidney disease, stage 3b: Secondary | ICD-10-CM

## 2023-07-15 DIAGNOSIS — J440 Chronic obstructive pulmonary disease with acute lower respiratory infection: Secondary | ICD-10-CM | POA: Diagnosis not present

## 2023-07-15 DIAGNOSIS — I130 Hypertensive heart and chronic kidney disease with heart failure and stage 1 through stage 4 chronic kidney disease, or unspecified chronic kidney disease: Secondary | ICD-10-CM | POA: Diagnosis not present

## 2023-07-16 DIAGNOSIS — I951 Orthostatic hypotension: Secondary | ICD-10-CM | POA: Diagnosis not present

## 2023-07-16 DIAGNOSIS — J441 Chronic obstructive pulmonary disease with (acute) exacerbation: Secondary | ICD-10-CM | POA: Diagnosis not present

## 2023-07-16 DIAGNOSIS — J449 Chronic obstructive pulmonary disease, unspecified: Secondary | ICD-10-CM | POA: Diagnosis not present

## 2023-07-16 DIAGNOSIS — M6281 Muscle weakness (generalized): Secondary | ICD-10-CM | POA: Diagnosis not present

## 2023-07-16 DIAGNOSIS — D329 Benign neoplasm of meninges, unspecified: Secondary | ICD-10-CM | POA: Diagnosis not present

## 2023-07-16 DIAGNOSIS — M179 Osteoarthritis of knee, unspecified: Secondary | ICD-10-CM | POA: Diagnosis not present

## 2023-07-16 NOTE — Telephone Encounter (Signed)
 Called and discussed. Will call patient about Ozempic on Thursday AM.

## 2023-07-18 ENCOUNTER — Telehealth: Payer: Self-pay

## 2023-07-18 MED ORDER — INSULIN PEN NEEDLE 30G X 5 MM MISC: 3 refills | Status: DC

## 2023-07-18 MED ORDER — BLOOD GLUCOSE TEST STRIPS 333 VI STRP: ORAL_STRIP | 3 refills | Status: DC

## 2023-07-18 MED ORDER — SEMAGLUTIDE(0.25 OR 0.5MG/DOS) 2 MG/1.5ML ~~LOC~~ SOPN: 0.2500 mg | PEN_INJECTOR | SUBCUTANEOUS | 1 refills | Status: DC

## 2023-07-18 MED ORDER — SODIUM BICARBONATE 650 MG PO TABS: 650.0000 mg | ORAL_TABLET | Freq: Three times a day (TID) | ORAL | 3 refills | Status: DC

## 2023-07-18 NOTE — Telephone Encounter (Signed)
 Noted, will monitor wound.   Suzann Daring, MD  Family Medicine Teaching Service

## 2023-07-18 NOTE — Telephone Encounter (Signed)
 Called pharmacy and cancelled prescription for Tradjenta .   [name], RN

## 2023-07-18 NOTE — Telephone Encounter (Signed)
 Called son and discussed results. Called patient, agreeable to Ozempic. Rx to pharmacy. Increased sodium bicarbonate  to TID.  RN team- please call and cancel trajenta.

## 2023-07-18 NOTE — Telephone Encounter (Signed)
 Received call from Samule, RN [name] regarding patient.   She reports that patient is refusing visits for wound care. Patient had small area to the back of her right shoulder that they were treating since her last hospitalization. This area is now mostly healed and son has been applying a cream to the area.   As patient is refusing the wound care, RN is requesting orders for RN visits monthly for catheter changes and blood work.   Provided with verbal orders. Advised that we would contact them directly if patient has additional needs.   [name], RN

## 2023-07-22 ENCOUNTER — Encounter: Payer: Self-pay | Admitting: Family Medicine

## 2023-07-22 DIAGNOSIS — N1832 Chronic kidney disease, stage 3b: Secondary | ICD-10-CM | POA: Diagnosis not present

## 2023-07-22 DIAGNOSIS — J440 Chronic obstructive pulmonary disease with acute lower respiratory infection: Secondary | ICD-10-CM | POA: Diagnosis not present

## 2023-07-22 DIAGNOSIS — J441 Chronic obstructive pulmonary disease with (acute) exacerbation: Secondary | ICD-10-CM | POA: Diagnosis not present

## 2023-07-22 DIAGNOSIS — I5043 Acute on chronic combined systolic (congestive) and diastolic (congestive) heart failure: Secondary | ICD-10-CM | POA: Diagnosis not present

## 2023-07-22 DIAGNOSIS — E1122 Type 2 diabetes mellitus with diabetic chronic kidney disease: Secondary | ICD-10-CM | POA: Diagnosis not present

## 2023-07-22 DIAGNOSIS — I130 Hypertensive heart and chronic kidney disease with heart failure and stage 1 through stage 4 chronic kidney disease, or unspecified chronic kidney disease: Secondary | ICD-10-CM | POA: Diagnosis not present

## 2023-07-22 NOTE — Telephone Encounter (Signed)
 RN team - Please call [name] and ask for  Teaching for Ozempic  this week Please have them send urine culture and lab results ASAP

## 2023-07-22 NOTE — Telephone Encounter (Signed)
 See encounter requesting sensitivities

## 2023-07-23 ENCOUNTER — Ambulatory Visit: Payer: Self-pay | Admitting: Family Medicine

## 2023-07-23 MED ORDER — AMOXICILLIN 250 MG PO CAPS: 250.0000 mg | ORAL_CAPSULE | Freq: Two times a day (BID) | ORAL | 0 refills | Status: DC

## 2023-07-23 NOTE — Telephone Encounter (Signed)
 Called son with results. Rx amoxicillin  250 mg BID for 5 days.   Suzann Daring, MD  Family Medicine Teaching Service

## 2023-07-23 NOTE — Telephone Encounter (Signed)
 Called [name]. Clinical staff were in a meeting. Left message with Joesph asking for returned call.   [name], RN

## 2023-07-24 ENCOUNTER — Telehealth: Payer: Self-pay | Admitting: Family Medicine

## 2023-07-24 MED ORDER — AMOXICILLIN 250 MG PO CAPS: 250.0000 mg | ORAL_CAPSULE | Freq: Two times a day (BID) | ORAL | 0 refills | Status: DC

## 2023-07-24 NOTE — Telephone Encounter (Signed)
 Patient calls nurse line regarding amoxicillin  prescription.   She states that this needs to be sent to [facility] so that this can be delivered.   Called and cancelled at [facility]. Resent to [facility] per patient request.   Abigail JAYSON English, RN

## 2023-07-24 NOTE — Telephone Encounter (Signed)
 Called patient. Son ([name]) had heart attack. Supportive listening provided.   Suzann Daring, MD  Family Medicine Teaching Service

## 2023-07-24 NOTE — Addendum Note (Signed)
 Addended by: Jazon Jipson C on: 07/24/2023 12:16 PM   Modules accepted: Orders

## 2023-08-03 DIAGNOSIS — J811 Chronic pulmonary edema: Secondary | ICD-10-CM | POA: Diagnosis not present

## 2023-08-05 DIAGNOSIS — M179 Osteoarthritis of knee, unspecified: Secondary | ICD-10-CM | POA: Diagnosis not present

## 2023-08-05 DIAGNOSIS — I951 Orthostatic hypotension: Secondary | ICD-10-CM | POA: Diagnosis not present

## 2023-08-05 DIAGNOSIS — J449 Chronic obstructive pulmonary disease, unspecified: Secondary | ICD-10-CM | POA: Diagnosis not present

## 2023-08-08 ENCOUNTER — Telehealth: Payer: Self-pay | Admitting: Family Medicine

## 2023-08-08 NOTE — Progress Notes (Signed)
 Remote pacemaker transmission.

## 2023-08-08 NOTE — Progress Notes (Unsigned)
 SUBJECTIVE:   CHIEF COMPLAINT: follow up on Ozempic   HPI:   Home visit the patient is wheelchair-bound and not able to transfer or ambulate easily.  Therefore a home visit was conducted due to her difficulty with transportation.  Abigail Wallace is a 78 y.o.  with history notable for type 2 diabetes that is well-controlled, chronic kidney disease stage IIIb, atrial fibrillation on apixaban  and mood disorder with anxiety presenting for a home visit.  She reports overall she is doing okay.  Her son is in [place] for transplant evaluation.  Her son who provides care to her 24/7 recently was hospitalized with a NSTEMI and underwent stent placement.  He is returned and he feels better.  The patient stress has been high given these family situations.  She is excited as she has a new grandson named CJ.   In terms of the patient's diabetes.  Her morning blood sugars range from 120-160.  There are no values greater than 180.  Her last A1c was appropriate and in range. She recently started Ozempic . No side effects. Has had 2 doses so far.   Her BP remains appropriate. It can be elevated with stress. No HA, CP, dyspnea.   She is due for Foley catheter change next week.   PERTINENT  PMH / PSH/Family/Social History :  Currently requires 24/7 Has not been out of bed in sometime now  OBJECTIVE:   BP 132/74 (BP Location: Right Arm)   Resp 14   SpO2 96%   Today's weight:  Review of prior weights: There were no vitals filed for this visit.  RRR Lungs clear Talkative today, appropriate, less anxious   ASSESSMENT/PLAN:   Assessment & Plan Paroxysmal atrial fibrillation [facility]) s/p cardioversion  Doing well Rate controlled Follows with Dr. JAYSON Well controlled type 2 diabetes mellitus (HCC) Increased Ozempic  to 0.5 mg A1C in September Doing well overall and tolerating  Chronic gout without tophus, unspecified cause, unspecified site Will obtain uric acid level.  Repeat BMP and then we  will restart her allopurinol  in combination with colchicine to reduce the risk of gout flare. Chronic heart failure with preserved ejection fraction (HCC) Doing well oxygen  saturation  is appropriate.  Continue Lasix  60 mg daily Pulmonary nodules May 2025 CT Narrative & Impression  CLINICAL DATA:  Dyspnea.   EXAM: CT CHEST WITHOUT CONTRAST   TECHNIQUE: Multidetector CT imaging of the chest was performed following the standard protocol without IV contrast.   RADIATION DOSE REDUCTION: This exam was performed according to the departmental dose-optimization program which includes automated exposure control, adjustment of the mA and/or kV according to patient size and/or use of iterative reconstruction technique.   COMPARISON:  04/26/2022,.   FINDINGS: Cardiovascular: The heart is enlarged and there is a trace pericardial effusion. A pacemaker lead is noted in the heart. Three-vessel coronary artery calcifications are noted. There is atherosclerotic calcification of the aorta without evidence of aneurysm. The pulmonary trunk is normal in caliber.   Mediastinum/Nodes: No mediastinal or axillary lymphadenopathy. Evaluation of the hila is limited due to lack of IV contrast. The trachea and esophagus are within normal limits.   Lungs/Pleura: Paraseptal emphysematous changes are present in the lungs. Mild apical scarring is present bilaterally. There is a nodule in the left lower lobe measuring 1.2 cm, axial image 61, not significantly changed from 2021. Minimal atelectasis is present at the lung bases. No effusion or pneumothorax is seen.   Upper Abdomen: The gallbladder is surgically absent.  No acute abnormality.   Musculoskeletal: Degenerative changes are present in the thoracic spine. Paste biker device is noted in the anterior left chest. No acute osseous abnormality.   IMPRESSION: 1. No acute cardiopulmonary process. 2. Stable 12 mm left lower lobe pulmonary nodule, not  significantly changed from 2021. 3. Emphysematous changes. 4. Coronary artery calcifications. 5. Aortic atherosclerosis.     Repeat in 1 year pending clinical course  Essential hypertension At goal, can increase amlodipine  to 10 mg if needed.     Suzann Daring, MD  Family Medicine Teaching Service  [facility]

## 2023-08-08 NOTE — Telephone Encounter (Signed)
 Please call patient/son and let them know I will be at home around 845 for a home visit tomorrow AM  Suzann Daring, MD  [facility] Medicine Teaching Service

## 2023-08-09 ENCOUNTER — Telehealth: Payer: Self-pay | Admitting: Family Medicine

## 2023-08-09 ENCOUNTER — Encounter: Payer: Self-pay | Admitting: Family Medicine

## 2023-08-09 ENCOUNTER — Other Ambulatory Visit: Admitting: Family Medicine

## 2023-08-09 VITALS — BP 132/74 | Resp 14

## 2023-08-09 DIAGNOSIS — M1A9XX0 Chronic gout, unspecified, without tophus (tophi): Secondary | ICD-10-CM

## 2023-08-09 DIAGNOSIS — E119 Type 2 diabetes mellitus without complications: Secondary | ICD-10-CM | POA: Diagnosis not present

## 2023-08-09 DIAGNOSIS — I5032 Chronic diastolic (congestive) heart failure: Secondary | ICD-10-CM

## 2023-08-09 DIAGNOSIS — R079 Chest pain, unspecified: Secondary | ICD-10-CM | POA: Diagnosis not present

## 2023-08-09 DIAGNOSIS — I480 Paroxysmal atrial fibrillation: Secondary | ICD-10-CM

## 2023-08-09 DIAGNOSIS — I10 Essential (primary) hypertension: Secondary | ICD-10-CM

## 2023-08-09 DIAGNOSIS — R918 Other nonspecific abnormal finding of lung field: Secondary | ICD-10-CM | POA: Diagnosis not present

## 2023-08-09 DIAGNOSIS — I5030 Unspecified diastolic (congestive) heart failure: Secondary | ICD-10-CM | POA: Insufficient documentation

## 2023-08-09 DIAGNOSIS — R410 Disorientation, unspecified: Secondary | ICD-10-CM | POA: Diagnosis not present

## 2023-08-09 MED ORDER — SEMAGLUTIDE(0.25 OR 0.5MG/DOS) 2 MG/1.5ML ~~LOC~~ SOPN: 0.5000 mg | PEN_INJECTOR | SUBCUTANEOUS | 1 refills | Status: DC

## 2023-08-09 NOTE — Assessment & Plan Note (Signed)
 Doing well Rate controlled Follows with Dr. JAYSON

## 2023-08-09 NOTE — Telephone Encounter (Signed)
 Called [name] and spoke with [name].   Verbal orders given.   [name], RN

## 2023-08-09 NOTE — Assessment & Plan Note (Addendum)
 May 2025 CT Narrative & Impression  CLINICAL DATA:  Dyspnea.   EXAM: CT CHEST WITHOUT CONTRAST   TECHNIQUE: Multidetector CT imaging of the chest was performed following the standard protocol without IV contrast.   RADIATION DOSE REDUCTION: This exam was performed according to the departmental dose-optimization program which includes automated exposure control, adjustment of the mA and/or kV according to patient size and/or use of iterative reconstruction technique.   COMPARISON:  04/26/2022,.   FINDINGS: Cardiovascular: The heart is enlarged and there is a trace pericardial effusion. A pacemaker lead is noted in the heart. Three-vessel coronary artery calcifications are noted. There is atherosclerotic calcification of the aorta without evidence of aneurysm. The pulmonary trunk is normal in caliber.   Mediastinum/Nodes: No mediastinal or axillary lymphadenopathy. Evaluation of the hila is limited due to lack of IV contrast. The trachea and esophagus are within normal limits.   Lungs/Pleura: Paraseptal emphysematous changes are present in the lungs. Mild apical scarring is present bilaterally. There is a nodule in the left lower lobe measuring 1.2 cm, axial image 61, not significantly changed from 2021. Minimal atelectasis is present at the lung bases. No effusion or pneumothorax is seen.   Upper Abdomen: The gallbladder is surgically absent. No acute abnormality.   Musculoskeletal: Degenerative changes are present in the thoracic spine. Paste biker device is noted in the anterior left chest. No acute osseous abnormality.   IMPRESSION: 1. No acute cardiopulmonary process. 2. Stable 12 mm left lower lobe pulmonary nodule, not significantly changed from 2021. 3. Emphysematous changes. 4. Coronary artery calcifications. 5. Aortic atherosclerosis.     Repeat in 1 year pending clinical course

## 2023-08-09 NOTE — Assessment & Plan Note (Addendum)
 Doing well oxygen  saturation  is appropriate.  Continue Lasix  60 mg daily

## 2023-08-09 NOTE — Assessment & Plan Note (Addendum)
 Increased Ozempic  to 0.5 mg A1C in September Doing well overall and tolerating

## 2023-08-09 NOTE — Assessment & Plan Note (Addendum)
 Will obtain uric acid level.  Repeat BMP and then we will restart her allopurinol  in combination with colchicine to reduce the risk of gout flare.

## 2023-08-09 NOTE — Telephone Encounter (Signed)
 RN team- please call [facility]. Patient has foley change next week.  Please ask to draw - CBC - CMP - Uric acid   -Urine culture off NEW catheter   Thanks!

## 2023-08-09 NOTE — Assessment & Plan Note (Addendum)
 At goal, can increase amlodipine  to 10 mg if needed.

## 2023-08-11 ENCOUNTER — Encounter: Payer: Self-pay | Admitting: Family Medicine

## 2023-08-11 DIAGNOSIS — E119 Type 2 diabetes mellitus without complications: Secondary | ICD-10-CM

## 2023-08-11 DIAGNOSIS — M546 Pain in thoracic spine: Secondary | ICD-10-CM

## 2023-08-12 ENCOUNTER — Other Ambulatory Visit: Payer: Self-pay

## 2023-08-12 MED ORDER — VITAMIN D 25 MCG (1000 UNIT) PO TABS: 1000.0000 [IU] | ORAL_TABLET | Freq: Every day | ORAL | 3 refills | Status: AC

## 2023-08-15 MED ORDER — NYSTATIN 100000 UNIT/GM EX POWD: 1.0000 | Freq: Every day | CUTANEOUS | 2 refills | Status: DC

## 2023-08-15 MED ORDER — DICLOFENAC SODIUM 1 % EX GEL: 2.0000 g | Freq: Four times a day (QID) | CUTANEOUS | 2 refills | Status: DC | PRN

## 2023-08-15 NOTE — Addendum Note (Signed)
 Addended by: Blayde Bacigalupi C on: 08/15/2023 09:20 AM   Modules accepted: Orders

## 2023-08-17 DIAGNOSIS — D329 Benign neoplasm of meninges, unspecified: Secondary | ICD-10-CM | POA: Diagnosis not present

## 2023-08-17 DIAGNOSIS — J441 Chronic obstructive pulmonary disease with (acute) exacerbation: Secondary | ICD-10-CM | POA: Diagnosis not present

## 2023-08-19 MED ORDER — DICLOFENAC SODIUM 1 % EX GEL: 2.0000 g | Freq: Four times a day (QID) | CUTANEOUS | 2 refills | Status: AC | PRN

## 2023-08-19 MED ORDER — ACCU-CHEK GUIDE TEST VI STRP: ORAL_STRIP | 12 refills | Status: DC

## 2023-08-19 MED ORDER — LANCET DEVICE MISC
3 refills | Status: DC
Start: 2023-08-19 — End: 2023-09-28

## 2023-08-19 MED ORDER — NYSTATIN 100000 UNIT/GM EX POWD: 1.0000 | Freq: Every day | CUTANEOUS | 2 refills | Status: DC

## 2023-08-19 MED ORDER — ACCU-CHEK GUIDE W/DEVICE KIT: PACK | 0 refills | Status: DC

## 2023-08-19 NOTE — Addendum Note (Signed)
 Addended by: DELORES, Jove Beyl on: 08/19/2023 08:48 AM   Modules accepted: Orders

## 2023-08-20 DIAGNOSIS — I130 Hypertensive heart and chronic kidney disease with heart failure and stage 1 through stage 4 chronic kidney disease, or unspecified chronic kidney disease: Secondary | ICD-10-CM | POA: Diagnosis not present

## 2023-08-20 DIAGNOSIS — E1122 Type 2 diabetes mellitus with diabetic chronic kidney disease: Secondary | ICD-10-CM | POA: Diagnosis not present

## 2023-08-20 DIAGNOSIS — I2699 Other pulmonary embolism without acute cor pulmonale: Secondary | ICD-10-CM | POA: Diagnosis not present

## 2023-08-20 DIAGNOSIS — Z466 Encounter for fitting and adjustment of urinary device: Secondary | ICD-10-CM | POA: Diagnosis not present

## 2023-08-20 DIAGNOSIS — N1832 Chronic kidney disease, stage 3b: Secondary | ICD-10-CM | POA: Diagnosis not present

## 2023-08-20 DIAGNOSIS — I5043 Acute on chronic combined systolic (congestive) and diastolic (congestive) heart failure: Secondary | ICD-10-CM | POA: Diagnosis not present

## 2023-08-20 NOTE — Telephone Encounter (Addendum)
 Spoke with [name].   [name] is coming out today for below orders.   He reports they did not come out last week.   Advised to call the office if anything else is needed during her home visit.

## 2023-08-22 LAB — LAB REPORT - SCANNED: EGFR: 17

## 2023-08-26 ENCOUNTER — Encounter: Payer: Self-pay | Admitting: Family Medicine

## 2023-08-26 ENCOUNTER — Telehealth: Payer: Self-pay | Admitting: Family Medicine

## 2023-08-26 NOTE — Telephone Encounter (Signed)
 Called son. See separate note

## 2023-08-26 NOTE — Telephone Encounter (Signed)
 Called son. Labs  reviewed   WBC 12.2  Hemoglobin 11.7  Hct 36.7 Lymphocytes 3.2 Neutrophils 7.7   BUN 51 Creatinine 2.79      Recommended Lasix  60 mg every other day.   He will send urine culture.   All questions.   Suzann Daring, MD  Family Medicine Teaching Service

## 2023-08-27 ENCOUNTER — Other Ambulatory Visit: Payer: Self-pay

## 2023-08-27 MED ORDER — PRAVASTATIN SODIUM 40 MG PO TABS: 40.0000 mg | ORAL_TABLET | Freq: Every evening | ORAL | 0 refills | Status: DC

## 2023-08-27 NOTE — Telephone Encounter (Signed)
 RN Team- Can you please call [facility] and ask them to fax results? There are patient view which is really difficult  Suzann Daring, MD  [facility] Medicine Teaching Service

## 2023-08-30 NOTE — Telephone Encounter (Signed)
 Patient calls nurse line in regards to lab results.   Have you been able to view these yet?

## 2023-09-02 ENCOUNTER — Other Ambulatory Visit: Payer: Self-pay | Admitting: Cardiovascular Disease

## 2023-09-02 ENCOUNTER — Telehealth: Payer: Self-pay

## 2023-09-02 NOTE — Telephone Encounter (Signed)
 Called [name]. They are re-faxing results to our office, attention to Dr. Delores.   [name], RN

## 2023-09-02 NOTE — Telephone Encounter (Signed)
 No faxes in box or epic--can you please check with [name]?  Thank you Suzann Daring, MD  [facility] Medicine Teaching Service

## 2023-09-02 NOTE — Telephone Encounter (Signed)
 Patient calls nurse line requesting a refill on Metoprolol .  This medication is already pending in a different encounter at her Cardiologists office.   See other encounter.

## 2023-09-03 ENCOUNTER — Other Ambulatory Visit: Payer: Self-pay | Admitting: *Deleted

## 2023-09-03 ENCOUNTER — Encounter: Payer: Self-pay | Admitting: Family Medicine

## 2023-09-03 DIAGNOSIS — J811 Chronic pulmonary edema: Secondary | ICD-10-CM | POA: Diagnosis not present

## 2023-09-03 DIAGNOSIS — J449 Chronic obstructive pulmonary disease, unspecified: Secondary | ICD-10-CM

## 2023-09-03 MED ORDER — ALBUTEROL SULFATE HFA 108 (90 BASE) MCG/ACT IN AERS
2.0000 | INHALATION_SPRAY | Freq: Four times a day (QID) | RESPIRATORY_TRACT | 3 refills | Status: DC | PRN
Start: 2023-09-03 — End: 2023-11-11

## 2023-09-04 ENCOUNTER — Telehealth: Payer: Self-pay

## 2023-09-04 ENCOUNTER — Other Ambulatory Visit: Payer: Self-pay

## 2023-09-04 DIAGNOSIS — I951 Orthostatic hypotension: Secondary | ICD-10-CM | POA: Diagnosis not present

## 2023-09-04 DIAGNOSIS — M6281 Muscle weakness (generalized): Secondary | ICD-10-CM | POA: Diagnosis not present

## 2023-09-04 DIAGNOSIS — M179 Osteoarthritis of knee, unspecified: Secondary | ICD-10-CM | POA: Diagnosis not present

## 2023-09-04 DIAGNOSIS — J449 Chronic obstructive pulmonary disease, unspecified: Secondary | ICD-10-CM | POA: Diagnosis not present

## 2023-09-04 MED ORDER — METOPROLOL TARTRATE 100 MG PO TABS: 100.0000 mg | ORAL_TABLET | Freq: Three times a day (TID) | ORAL | 3 refills | Status: AC

## 2023-09-04 MED ORDER — IPRATROPIUM-ALBUTEROL 0.5-2.5 (3) MG/3ML IN SOLN: 3.0000 mL | RESPIRATORY_TRACT | 3 refills | Status: DC | PRN

## 2023-09-04 NOTE — Telephone Encounter (Signed)
 [name] calls nurse line in regards to possible UTI.   He reports he changes the bag every 5 days, most recent on Sunday. He reports an abnormal odor and reports she is complaining of dysuria.   He reports she is well appearing. He reports no signs of altered mental status. He denies any fevers.   He reports he called [name] to come out, however they stated they didn't have anyone to send. He reports no one has been out all week and nothing scheduled.   Advised will forward to PCP.   Red flags discussed with [name].

## 2023-09-04 NOTE — Telephone Encounter (Signed)
 Called son and discussed  Suzann Daring, MD  [facility] Medicine Teaching Service

## 2023-09-04 NOTE — Telephone Encounter (Signed)
 Called son.  Discussed how his mom was doing. She is doing okay. Called [facility] and requested results.  Suzann Daring, MD  Family Medicine Teaching Service

## 2023-09-05 DIAGNOSIS — I951 Orthostatic hypotension: Secondary | ICD-10-CM | POA: Diagnosis not present

## 2023-09-05 DIAGNOSIS — J449 Chronic obstructive pulmonary disease, unspecified: Secondary | ICD-10-CM | POA: Diagnosis not present

## 2023-09-05 DIAGNOSIS — M179 Osteoarthritis of knee, unspecified: Secondary | ICD-10-CM | POA: Diagnosis not present

## 2023-09-10 ENCOUNTER — Ambulatory Visit: Payer: Self-pay | Admitting: Family Medicine

## 2023-09-10 ENCOUNTER — Encounter: Payer: Self-pay | Admitting: Family Medicine

## 2023-09-10 LAB — LAB REPORT - SCANNED: EGFR: 17

## 2023-09-10 NOTE — Telephone Encounter (Signed)
 Called [name] and provided with verbal orders for urine culture and CMP, per Dr. Delores.   Abigail JAYSON English, RN

## 2023-09-10 NOTE — Telephone Encounter (Signed)
 RN Team - Can you please call to confirm [name]'s timing of the next RN visit and Foley Change?  - I scheduled a home visit for 9/26---please let me know if this does not work for the family--we can message them  I still have not received any final culture data from [name]--I have already called last week--can we check on this again?  Thanks CB

## 2023-09-10 NOTE — Telephone Encounter (Signed)
 Thank you RN Team - Please call [name] and ask for - CMP, Urine culture at 9/2 visit with foley change  Thank you Suzann Daring, MD  [facility] Medicine Teaching Service

## 2023-09-10 NOTE — Telephone Encounter (Signed)
 Called [name].   Next RN visit is scheduled for 9/2 for Foley change.   She reports last urine culture was 8/5, she reports she will be faxing over urine culture results for review.   Will forward to PCP to make aware.

## 2023-09-11 DIAGNOSIS — R338 Other retention of urine: Secondary | ICD-10-CM | POA: Diagnosis not present

## 2023-09-12 DIAGNOSIS — N1832 Chronic kidney disease, stage 3b: Secondary | ICD-10-CM | POA: Diagnosis not present

## 2023-09-12 DIAGNOSIS — I130 Hypertensive heart and chronic kidney disease with heart failure and stage 1 through stage 4 chronic kidney disease, or unspecified chronic kidney disease: Secondary | ICD-10-CM | POA: Diagnosis not present

## 2023-09-12 DIAGNOSIS — Z466 Encounter for fitting and adjustment of urinary device: Secondary | ICD-10-CM | POA: Diagnosis not present

## 2023-09-12 DIAGNOSIS — I5043 Acute on chronic combined systolic (congestive) and diastolic (congestive) heart failure: Secondary | ICD-10-CM | POA: Diagnosis not present

## 2023-09-12 DIAGNOSIS — I2699 Other pulmonary embolism without acute cor pulmonale: Secondary | ICD-10-CM | POA: Diagnosis not present

## 2023-09-12 DIAGNOSIS — E1122 Type 2 diabetes mellitus with diabetic chronic kidney disease: Secondary | ICD-10-CM | POA: Diagnosis not present

## 2023-09-17 DIAGNOSIS — N1832 Chronic kidney disease, stage 3b: Secondary | ICD-10-CM | POA: Diagnosis not present

## 2023-09-17 DIAGNOSIS — I5043 Acute on chronic combined systolic (congestive) and diastolic (congestive) heart failure: Secondary | ICD-10-CM | POA: Diagnosis not present

## 2023-09-17 DIAGNOSIS — Z466 Encounter for fitting and adjustment of urinary device: Secondary | ICD-10-CM | POA: Diagnosis not present

## 2023-09-17 DIAGNOSIS — I130 Hypertensive heart and chronic kidney disease with heart failure and stage 1 through stage 4 chronic kidney disease, or unspecified chronic kidney disease: Secondary | ICD-10-CM | POA: Diagnosis not present

## 2023-09-17 DIAGNOSIS — D329 Benign neoplasm of meninges, unspecified: Secondary | ICD-10-CM | POA: Diagnosis not present

## 2023-09-17 DIAGNOSIS — E1122 Type 2 diabetes mellitus with diabetic chronic kidney disease: Secondary | ICD-10-CM | POA: Diagnosis not present

## 2023-09-17 DIAGNOSIS — I2699 Other pulmonary embolism without acute cor pulmonale: Secondary | ICD-10-CM | POA: Diagnosis not present

## 2023-09-17 DIAGNOSIS — J441 Chronic obstructive pulmonary disease with (acute) exacerbation: Secondary | ICD-10-CM | POA: Diagnosis not present

## 2023-09-19 ENCOUNTER — Ambulatory Visit: Payer: Self-pay | Admitting: Family Medicine

## 2023-09-19 ENCOUNTER — Other Ambulatory Visit: Payer: Self-pay

## 2023-09-19 LAB — LAB REPORT - SCANNED: EGFR: 18

## 2023-09-19 MED ORDER — ALLOPURINOL 100 MG PO TABS: 50.0000 mg | ORAL_TABLET | ORAL | 3 refills | Status: DC

## 2023-09-19 MED ORDER — ALLOPURINOL 100 MG PO TABS: 50.0000 mg | ORAL_TABLET | Freq: Every day | ORAL | 3 refills | Status: DC

## 2023-09-19 NOTE — Telephone Encounter (Signed)
 Called and discussed results.

## 2023-09-19 NOTE — Telephone Encounter (Signed)
 Restarted allopurinol  at lower dose given GFR--50 mg every other day.   Suzann Daring, MD  Family Medicine Teaching Service

## 2023-09-20 ENCOUNTER — Telehealth: Payer: Self-pay

## 2023-09-20 NOTE — Telephone Encounter (Signed)
 Patient Abigail Wallace on nurse line requesting lab results.   She reports continued symptoms of a UTI.   I called [name] to get more information. However, he did not answer.   [name] was due for a home visit this week for lab work.   Will forward to PCP.

## 2023-09-21 ENCOUNTER — Encounter: Payer: Self-pay | Admitting: Family Medicine

## 2023-09-21 ENCOUNTER — Other Ambulatory Visit: Payer: Self-pay | Admitting: Cardiovascular Disease

## 2023-09-21 DIAGNOSIS — N1832 Chronic kidney disease, stage 3b: Secondary | ICD-10-CM

## 2023-09-21 NOTE — Telephone Encounter (Signed)
Sent mychart for more details

## 2023-09-23 NOTE — Telephone Encounter (Signed)
 Patient returns call to nurse line. Patient wants to make sure that Dr. Delores has received her urine culture results.   I was unable to find results per chart review, will reach out to provider to see if she received results.   Patient reports continued burning sensation around catheter. Denies known fever or chills.   She also reports concerns with intermittent chest pain that has been occurring for the last 2 weeks. She feels that she is having indigestion now. She reports that she will usually take Tums or Pepto, however, does not want to take Pepto due to it causing her to get constipated. Patient is also adamant that she does not want to go to the ED due to long wait times. Denies radiation of her pain, nausea, vomiting, difficulty breathing. Patient speaking in complete sentences.   Patient is going to take Tums to see if indigestion improves. Advised patient of ED precautions.   Called son to discuss concerns further. States that mother is convinced that she has a UTI. Endorses burning and itching around catheter. Discussed chest pain concern. He states that she reports chest pain on a daily basis and will take nitroglycerin  like skittles and that this has been going on for awhile now. He reports that he is not going to force her to go to the ED if she does not want to go. He states that this is an ongoing issue with identifying if concern warrants ED evaluation or home monitoring. We discussed reasons to go to the ED and that I would reach out to PCP regarding concerns.   [name], RN

## 2023-09-24 NOTE — Telephone Encounter (Signed)
 Called [name].  They report they will be faxing over the Urine Culture order.   Please let me know if you do not receive this.

## 2023-09-24 NOTE — Telephone Encounter (Signed)
 RN Team---thank you for all of this--can you please call [name] and ask to fax urine culture? This should be completed an I have not seen it  Suzann Daring, MD  [facility] Medicine Teaching Service

## 2023-09-25 ENCOUNTER — Encounter: Payer: Self-pay | Admitting: Cardiovascular Disease

## 2023-09-25 ENCOUNTER — Telehealth: Payer: Self-pay | Admitting: Family Medicine

## 2023-09-25 MED ORDER — AMOXICILLIN-POT CLAVULANATE 250-125 MG PO TABS: 1.0000 | ORAL_TABLET | Freq: Two times a day (BID) | ORAL | 0 refills | Status: DC

## 2023-09-25 MED ORDER — PRAVASTATIN SODIUM 40 MG PO TABS: 40.0000 mg | ORAL_TABLET | Freq: Every evening | ORAL | 0 refills | Status: DC

## 2023-09-25 MED ORDER — SODIUM BICARBONATE 650 MG PO TABS: 650.0000 mg | ORAL_TABLET | Freq: Three times a day (TID) | ORAL | 3 refills | Status: DC

## 2023-09-25 NOTE — Telephone Encounter (Signed)
 Called son about UTI. Awaiting culture. Given symptoms, in interim, will treat with augmentin  given prior cultures.  RN Team- could you please call [name] (again) about culture- very sorry.    Suzann Daring, MD  Family Medicine Teaching Service

## 2023-09-26 ENCOUNTER — Inpatient Hospital Stay (HOSPITAL_COMMUNITY)
Admission: EM | Admit: 2023-09-26 | Discharge: 2023-09-28 | DRG: 698 | Disposition: A | Discharge status: Home / Self Care | Attending: Family Medicine | Admitting: Family Medicine

## 2023-09-26 ENCOUNTER — Inpatient Hospital Stay (HOSPITAL_COMMUNITY)

## 2023-09-26 ENCOUNTER — Other Ambulatory Visit (HOSPITAL_COMMUNITY)

## 2023-09-26 ENCOUNTER — Emergency Department (HOSPITAL_COMMUNITY)

## 2023-09-26 ENCOUNTER — Encounter (HOSPITAL_COMMUNITY): Payer: Self-pay | Admitting: Emergency Medicine

## 2023-09-26 ENCOUNTER — Other Ambulatory Visit: Payer: Self-pay

## 2023-09-26 DIAGNOSIS — R109 Unspecified abdominal pain: Secondary | ICD-10-CM | POA: Diagnosis present

## 2023-09-26 DIAGNOSIS — K219 Gastro-esophageal reflux disease without esophagitis: Secondary | ICD-10-CM | POA: Diagnosis present

## 2023-09-26 DIAGNOSIS — Z79899 Other long term (current) drug therapy: Secondary | ICD-10-CM | POA: Diagnosis not present

## 2023-09-26 DIAGNOSIS — I4891 Unspecified atrial fibrillation: Secondary | ICD-10-CM | POA: Diagnosis present

## 2023-09-26 DIAGNOSIS — N1832 Chronic kidney disease, stage 3b: Secondary | ICD-10-CM | POA: Diagnosis not present

## 2023-09-26 DIAGNOSIS — G8929 Other chronic pain: Secondary | ICD-10-CM | POA: Diagnosis not present

## 2023-09-26 DIAGNOSIS — I25119 Atherosclerotic heart disease of native coronary artery with unspecified angina pectoris: Secondary | ICD-10-CM | POA: Diagnosis present

## 2023-09-26 DIAGNOSIS — M109 Gout, unspecified: Secondary | ICD-10-CM | POA: Diagnosis present

## 2023-09-26 DIAGNOSIS — B964 Proteus (mirabilis) (morganii) as the cause of diseases classified elsewhere: Secondary | ICD-10-CM | POA: Diagnosis not present

## 2023-09-26 DIAGNOSIS — Z1152 Encounter for screening for COVID-19: Secondary | ICD-10-CM

## 2023-09-26 DIAGNOSIS — E1122 Type 2 diabetes mellitus with diabetic chronic kidney disease: Secondary | ICD-10-CM | POA: Diagnosis not present

## 2023-09-26 DIAGNOSIS — T83511A Infection and inflammatory reaction due to indwelling urethral catheter, initial encounter: Principal | ICD-10-CM | POA: Diagnosis present

## 2023-09-26 DIAGNOSIS — F200 Paranoid schizophrenia: Secondary | ICD-10-CM | POA: Diagnosis present

## 2023-09-26 DIAGNOSIS — J439 Emphysema, unspecified: Secondary | ICD-10-CM | POA: Diagnosis present

## 2023-09-26 DIAGNOSIS — R0989 Other specified symptoms and signs involving the circulatory and respiratory systems: Secondary | ICD-10-CM | POA: Diagnosis not present

## 2023-09-26 DIAGNOSIS — E872 Acidosis, unspecified: Secondary | ICD-10-CM | POA: Diagnosis not present

## 2023-09-26 DIAGNOSIS — I495 Sick sinus syndrome: Secondary | ICD-10-CM | POA: Diagnosis not present

## 2023-09-26 DIAGNOSIS — E785 Hyperlipidemia, unspecified: Secondary | ICD-10-CM | POA: Diagnosis present

## 2023-09-26 DIAGNOSIS — I130 Hypertensive heart and chronic kidney disease with heart failure and stage 1 through stage 4 chronic kidney disease, or unspecified chronic kidney disease: Secondary | ICD-10-CM | POA: Diagnosis present

## 2023-09-26 DIAGNOSIS — Z888 Allergy status to other drugs, medicaments and biological substances status: Secondary | ICD-10-CM

## 2023-09-26 DIAGNOSIS — Z7901 Long term (current) use of anticoagulants: Secondary | ICD-10-CM

## 2023-09-26 DIAGNOSIS — Z881 Allergy status to other antibiotic agents status: Secondary | ICD-10-CM

## 2023-09-26 DIAGNOSIS — Z7401 Bed confinement status: Secondary | ICD-10-CM

## 2023-09-26 DIAGNOSIS — I5033 Acute on chronic diastolic (congestive) heart failure: Secondary | ICD-10-CM | POA: Diagnosis not present

## 2023-09-26 DIAGNOSIS — Y846 Urinary catheterization as the cause of abnormal reaction of the patient, or of later complication, without mention of misadventure at the time of the procedure: Secondary | ICD-10-CM | POA: Diagnosis present

## 2023-09-26 DIAGNOSIS — R059 Cough, unspecified: Secondary | ICD-10-CM | POA: Diagnosis not present

## 2023-09-26 DIAGNOSIS — I7143 Infrarenal abdominal aortic aneurysm, without rupture: Secondary | ICD-10-CM | POA: Diagnosis present

## 2023-09-26 DIAGNOSIS — R0600 Dyspnea, unspecified: Secondary | ICD-10-CM | POA: Diagnosis present

## 2023-09-26 DIAGNOSIS — Z885 Allergy status to narcotic agent status: Secondary | ICD-10-CM

## 2023-09-26 DIAGNOSIS — K439 Ventral hernia without obstruction or gangrene: Secondary | ICD-10-CM | POA: Diagnosis present

## 2023-09-26 DIAGNOSIS — Z8249 Family history of ischemic heart disease and other diseases of the circulatory system: Secondary | ICD-10-CM

## 2023-09-26 DIAGNOSIS — N281 Cyst of kidney, acquired: Secondary | ICD-10-CM | POA: Diagnosis not present

## 2023-09-26 DIAGNOSIS — Z87891 Personal history of nicotine dependence: Secondary | ICD-10-CM

## 2023-09-26 DIAGNOSIS — Z7985 Long-term (current) use of injectable non-insulin antidiabetic drugs: Secondary | ICD-10-CM | POA: Diagnosis not present

## 2023-09-26 DIAGNOSIS — R079 Chest pain, unspecified: Secondary | ICD-10-CM | POA: Diagnosis present

## 2023-09-26 DIAGNOSIS — N390 Urinary tract infection, site not specified: Secondary | ICD-10-CM | POA: Diagnosis not present

## 2023-09-26 DIAGNOSIS — Z950 Presence of cardiac pacemaker: Secondary | ICD-10-CM

## 2023-09-26 DIAGNOSIS — K5900 Constipation, unspecified: Secondary | ICD-10-CM | POA: Diagnosis present

## 2023-09-26 DIAGNOSIS — J9610 Chronic respiratory failure, unspecified whether with hypoxia or hypercapnia: Secondary | ICD-10-CM | POA: Diagnosis not present

## 2023-09-26 DIAGNOSIS — Z789 Other specified health status: Secondary | ICD-10-CM

## 2023-09-26 DIAGNOSIS — R1084 Generalized abdominal pain: Secondary | ICD-10-CM | POA: Insufficient documentation

## 2023-09-26 DIAGNOSIS — R918 Other nonspecific abnormal finding of lung field: Secondary | ICD-10-CM | POA: Diagnosis not present

## 2023-09-26 DIAGNOSIS — J449 Chronic obstructive pulmonary disease, unspecified: Secondary | ICD-10-CM | POA: Diagnosis not present

## 2023-09-26 DIAGNOSIS — Z823 Family history of stroke: Secondary | ICD-10-CM

## 2023-09-26 DIAGNOSIS — R404 Transient alteration of awareness: Secondary | ICD-10-CM | POA: Diagnosis not present

## 2023-09-26 DIAGNOSIS — Z7951 Long term (current) use of inhaled steroids: Secondary | ICD-10-CM

## 2023-09-26 DIAGNOSIS — I10 Essential (primary) hypertension: Secondary | ICD-10-CM | POA: Diagnosis not present

## 2023-09-26 DIAGNOSIS — Z91040 Latex allergy status: Secondary | ICD-10-CM

## 2023-09-26 LAB — RESPIRATORY PANEL BY PCR

## 2023-09-26 LAB — BLOOD CULTURE ID PANEL (REFLEXED) - BCID2

## 2023-09-26 LAB — CBC WITH DIFFERENTIAL/PLATELET
Abs Immature Granulocytes: 0.23 K/uL — ABNORMAL HIGH (ref 0.00–0.07)
Basophils Absolute: 0.1 K/uL (ref 0.0–0.1)
Basophils Relative: 1 %
Eosinophils Absolute: 0.2 K/uL (ref 0.0–0.5)
Eosinophils Relative: 2 %
HCT: 33.6 % — ABNORMAL LOW (ref 36.0–46.0)
Hemoglobin: 11 g/dL — ABNORMAL LOW (ref 12.0–15.0)
Immature Granulocytes: 2 %
Lymphocytes Relative: 18 %
Lymphs Abs: 2.5 K/uL (ref 0.7–4.0)
MCH: 29.6 pg (ref 26.0–34.0)
MCHC: 32.7 g/dL (ref 30.0–36.0)
MCV: 90.3 fL (ref 80.0–100.0)
Monocytes Absolute: 0.8 K/uL (ref 0.1–1.0)
Monocytes Relative: 6 %
Neutro Abs: 10.2 K/uL — ABNORMAL HIGH (ref 1.7–7.7)
Neutrophils Relative %: 71 %
Platelets: 223 K/uL (ref 150–400)
RBC: 3.72 MIL/uL — ABNORMAL LOW (ref 3.87–5.11)
RDW: 14.9 % (ref 11.5–15.5)
WBC: 14 K/uL — ABNORMAL HIGH (ref 4.0–10.5)
nRBC: 0 % (ref 0.0–0.2)

## 2023-09-26 LAB — GLUCOSE, CAPILLARY
Glucose-Capillary: 156 mg/dL — ABNORMAL HIGH (ref 70–99)
Glucose-Capillary: 130 mg/dL — ABNORMAL HIGH (ref 70–99)

## 2023-09-26 LAB — URINALYSIS, W/ REFLEX TO CULTURE (INFECTION SUSPECTED)
Bilirubin Urine: NEGATIVE
Glucose, UA: >=500 mg/dL — AB
Ketones, ur: NEGATIVE mg/dL
Nitrite: NEGATIVE
Protein, ur: >=300 mg/dL — AB
Specific Gravity, Urine: 1.013 (ref 1.005–1.030)
WBC, UA: >50 WBC/hpf (ref 0–5)
pH: 5 (ref 5.0–8.0)

## 2023-09-26 LAB — RESP PANEL BY RT-PCR (RSV, FLU A&B, COVID)  RVPGX2
Influenza A by PCR: NEGATIVE
Influenza B by PCR: NEGATIVE
Resp Syncytial Virus by PCR: NEGATIVE
SARS Coronavirus 2 by RT PCR: NEGATIVE

## 2023-09-26 LAB — COMPREHENSIVE METABOLIC PANEL WITH GFR
ALT: 16 U/L (ref 0–44)
AST: 24 U/L (ref 15–41)
Albumin: 3 g/dL — ABNORMAL LOW (ref 3.5–5.0)
Alkaline Phosphatase: 52 U/L (ref 38–126)
Anion gap: 15 (ref 5–15)
BUN: 36 mg/dL — ABNORMAL HIGH (ref 8–23)
CO2: 19 mmol/L — ABNORMAL LOW (ref 22–32)
Calcium: 10.1 mg/dL (ref 8.9–10.3)
Chloride: 98 mmol/L (ref 98–111)
Creatinine, Ser: 2.53 mg/dL — ABNORMAL HIGH (ref 0.44–1.00)
GFR, Estimated: 19 mL/min — ABNORMAL LOW (ref >60)
Glucose, Bld: 189 mg/dL — ABNORMAL HIGH (ref 70–99)
Potassium: 4 mmol/L (ref 3.5–5.1)
Sodium: 132 mmol/L — ABNORMAL LOW (ref 135–145)
Total Bilirubin: 0.3 mg/dL (ref 0.0–1.2)
Total Protein: 6.4 g/dL — ABNORMAL LOW (ref 6.5–8.1)

## 2023-09-26 LAB — CBG MONITORING, ED: Glucose-Capillary: 146 mg/dL — ABNORMAL HIGH (ref 70–99)

## 2023-09-26 LAB — TROPONIN I (HIGH SENSITIVITY)
Troponin I (High Sensitivity): 21 ng/L — ABNORMAL HIGH (ref <18)
Troponin I (High Sensitivity): 19 ng/L — ABNORMAL HIGH (ref <18)

## 2023-09-26 LAB — BRAIN NATRIURETIC PEPTIDE: B Natriuretic Peptide: 402.3 pg/mL — ABNORMAL HIGH (ref 0.0–100.0)

## 2023-09-26 LAB — I-STAT CG4 LACTIC ACID, ED
Lactic Acid, Venous: 2.8 mmol/L (ref 0.5–1.9)
Lactic Acid, Venous: 2.7 mmol/L (ref 0.5–1.9)

## 2023-09-26 LAB — VALPROIC ACID LEVEL: Valproic Acid Lvl: 23 ug/mL — ABNORMAL LOW (ref 50–100)

## 2023-09-26 LAB — PROTIME-INR
INR: 1 (ref 0.8–1.2)
Prothrombin Time: 13.9 s (ref 11.4–15.2)

## 2023-09-26 MED ORDER — FUROSEMIDE 10 MG/ML IJ SOLN
40.0000 mg | Freq: Once | INTRAMUSCULAR | Status: AC
  Administered 2023-09-26: 40 mg via INTRAVENOUS
  Filled 2023-09-26: qty 4

## 2023-09-26 MED ORDER — ACETAMINOPHEN 325 MG PO TABS
650.0000 mg | ORAL_TABLET | ORAL | Status: DC | PRN
  Administered 2023-09-26 – 2023-09-28 (×4): 650 mg via ORAL
  Filled 2023-09-26 (×4): qty 2

## 2023-09-26 MED ORDER — SODIUM BICARBONATE 650 MG PO TABS
650.0000 mg | ORAL_TABLET | Freq: Three times a day (TID) | ORAL | Status: DC
  Administered 2023-09-26 – 2023-09-28 (×6): 650 mg via ORAL
  Filled 2023-09-26 (×6): qty 1

## 2023-09-26 MED ORDER — AMOXICILLIN-POT CLAVULANATE 500-125 MG PO TABS
ORAL_TABLET | Freq: Two times a day (BID) | ORAL | Status: DC
  Administered 2023-09-26 – 2023-09-28 (×4): 1 via ORAL
  Filled 2023-09-26 (×8): qty 1

## 2023-09-26 MED ORDER — OLANZAPINE 10 MG PO TABS
10.0000 mg | ORAL_TABLET | Freq: Every day | ORAL | Status: DC
  Administered 2023-09-26 – 2023-09-27 (×2): 10 mg via ORAL
  Filled 2023-09-26 (×3): qty 1
  Filled 2023-09-26 (×2): qty 2

## 2023-09-26 MED ORDER — ALLOPURINOL 100 MG PO TABS
50.0000 mg | ORAL_TABLET | ORAL | Status: DC
  Administered 2023-09-26 – 2023-09-28 (×2): 50 mg via ORAL
  Filled 2023-09-26 (×2): qty 1

## 2023-09-26 MED ORDER — DIVALPROEX SODIUM 250 MG PO DR TAB
250.0000 mg | DELAYED_RELEASE_TABLET | Freq: Two times a day (BID) | ORAL | Status: DC
  Administered 2023-09-26 – 2023-09-28 (×5): 250 mg via ORAL
  Filled 2023-09-26 (×5): qty 1

## 2023-09-26 MED ORDER — APIXABAN 2.5 MG PO TABS
2.5000 mg | ORAL_TABLET | Freq: Two times a day (BID) | ORAL | Status: DC
  Administered 2023-09-26 – 2023-09-28 (×5): 2.5 mg via ORAL
  Filled 2023-09-26 (×5): qty 1

## 2023-09-26 MED ORDER — PIPERACILLIN-TAZOBACTAM 3.375 G IVPB: 3.3750 g | Freq: Three times a day (TID) | INTRAVENOUS | Status: DC

## 2023-09-26 MED ORDER — INSULIN ASPART 100 UNIT/ML IJ SOLN: 0.0000 [IU] | Freq: Three times a day (TID) | INTRAMUSCULAR | Status: DC

## 2023-09-26 MED ORDER — METOPROLOL TARTRATE 50 MG PO TABS
100.0000 mg | ORAL_TABLET | Freq: Three times a day (TID) | ORAL | Status: DC
  Administered 2023-09-26 – 2023-09-28 (×6): 100 mg via ORAL
  Filled 2023-09-26 (×4): qty 2
  Filled 2023-09-26: qty 4
  Filled 2023-09-26: qty 2

## 2023-09-26 MED ORDER — IPRATROPIUM-ALBUTEROL 0.5-2.5 (3) MG/3ML IN SOLN: 3.0000 mL | RESPIRATORY_TRACT | Status: DC | PRN

## 2023-09-26 MED ORDER — SENNA 8.6 MG PO TABS
1.0000 | ORAL_TABLET | Freq: Every day | ORAL | Status: DC
  Administered 2023-09-27 – 2023-09-28 (×2): 8.6 mg via ORAL
  Filled 2023-09-26 (×2): qty 1

## 2023-09-26 MED ORDER — CHLORHEXIDINE GLUCONATE CLOTH 2 % EX PADS
6.0000 | MEDICATED_PAD | Freq: Every day | CUTANEOUS | Status: DC
  Administered 2023-09-27 – 2023-09-28 (×2): 6 via TOPICAL

## 2023-09-26 MED ORDER — INSULIN ASPART 100 UNIT/ML IJ SOLN: 0.0000 [IU] | Freq: Every day | INTRAMUSCULAR | Status: DC

## 2023-09-26 MED ORDER — POLYETHYLENE GLYCOL 3350 17 G PO PACK
17.0000 g | PACK | Freq: Two times a day (BID) | ORAL | Status: DC
  Administered 2023-09-26 – 2023-09-27 (×2): 17 g via ORAL
  Filled 2023-09-26 (×5): qty 1

## 2023-09-26 MED ORDER — SODIUM CHLORIDE 0.9 % IV BOLUS (SEPSIS)
1000.0000 mL | Freq: Once | INTRAVENOUS | Status: AC
  Administered 2023-09-26: 1000 mL via INTRAVENOUS

## 2023-09-26 MED ORDER — BISACODYL 10 MG RE SUPP: 10.0000 mg | Freq: Once | RECTAL | Status: DC

## 2023-09-26 MED ORDER — BUDESON-GLYCOPYRROL-FORMOTEROL 160-9-4.8 MCG/ACT IN AERO
2.0000 | INHALATION_SPRAY | Freq: Two times a day (BID) | RESPIRATORY_TRACT | Status: DC
  Administered 2023-09-27 (×2): 2 via RESPIRATORY_TRACT
  Filled 2023-09-26: qty 5.9

## 2023-09-26 MED ORDER — PIPERACILLIN-TAZOBACTAM 3.375 G IVPB 30 MIN
3.3750 g | Freq: Once | INTRAVENOUS | Status: AC
  Administered 2023-09-26: 3.375 g via INTRAVENOUS
  Filled 2023-09-26: qty 50

## 2023-09-26 MED ORDER — PRAVASTATIN SODIUM 40 MG PO TABS
40.0000 mg | ORAL_TABLET | Freq: Every evening | ORAL | Status: DC
  Administered 2023-09-26 – 2023-09-27 (×2): 40 mg via ORAL
  Filled 2023-09-26 (×2): qty 1

## 2023-09-26 MED ORDER — AMLODIPINE BESYLATE 5 MG PO TABS
5.0000 mg | ORAL_TABLET | Freq: Every day | ORAL | Status: DC
  Administered 2023-09-26 – 2023-09-27 (×2): 5 mg via ORAL
  Filled 2023-09-26 (×2): qty 1

## 2023-09-26 NOTE — Evaluation (Signed)
 Physical Therapy Evaluation Patient Details Name: Abigail Wallace MRN: [id number] DOB: [birth year] Today's Date: 09/26/2023  History of Present Illness  Pt is 78 yo presenting to [facility] on 9/11 due to concerns by family of UTI, cough, chills and R flank pain.   PMH: COPD on 2L via [place], CHF, mild dementia, schizophrenia, urinary rentention with chronic foley cath in place, post polio syndrome, DM, pacemaker  Clinical Impression  Pt is presenting at Max A to total A for rolling and declines further mobility. Demonstrates generalized weakness throughout at 3+/5 throughout bil UE and LE. Pt is self limiting with mobility at baseline and son uses hoyer lift to transfer pt. Recommend pt return home with family. If family is on longer able to care for pt she will need a long term care facility. Currently pt is presenting at baseline level of functioning and no skilled physical therapy services recommended. Pt will be discharged from skilled physical therapy services at this time; please re-consult if further needs arise.          If plan is discharge home, recommend the following: Two people to help with walking and/or transfers;Assistance with cooking/housework;Assist for transportation;Help with stairs or ramp for entrance;Supervision due to cognitive status     Equipment Recommendations None recommended by PT     Functional Status Assessment Patient has not had a recent decline in their functional status     Precautions / Restrictions Precautions Precautions: Fall Recall of Precautions/Restrictions: Intact Restrictions Weight Bearing Restrictions Per Provider Order: No      Mobility  Bed Mobility Overal bed mobility: Needs Assistance Bed Mobility: Rolling Rolling: Max assist         General bed mobility comments: minimal assistance and movement. Pt is self limiting.    Transfers     General transfer comment: Pt is bed ridden and self limiting with mobility at baseline. Son uses hoyer  to lift her out of bed to W/C pt unclear if she gets out of bed every day or not.          Pertinent Vitals/Pain Pain Assessment Pain Assessment: Faces Faces Pain Scale: No hurt Pain Intervention(s): Monitored during session    Home Living Family/patient expects to be discharged to:: Private residence Living Arrangements: Children (lives with son) Available Help at Discharge: Family;Available 24 hours/day;Personal care attendant Type of Home: Apartment Home Access: Level entry       Home Layout: One level Home Equipment: Shower seat;BSC/3in1;Tub bench;Rollator (4 wheels);Hospital bed Additional Comments: hoyer and sit to stand lift    Prior Function Prior Level of Function : Needs assist             Mobility Comments: Pt has been bed level, hoyer to get to W/C. Pt is self limiting with mobility. ADLs Comments: Son and PCA's assist pt with all aspects of ADL's, son gets her in W/C via hoyer to wash her hair.     Extremity/Trunk Assessment   Upper Extremity Assessment Upper Extremity Assessment: Generalized weakness;RUE deficits/detail;LUE deficits/detail RUE Deficits / Details: 3+/5 strength in shoulder flexion/extension, elbow flexion/extension and wrist flexion/extension. Moderate grip strength (WFL) LUE Deficits / Details: 3+/5 strength in shoulder flexion/extension, elbow flexion/extension and wrist flexion/extension. Moderate grip strength (WFL) LUE Sensation: WNL LUE Coordination: WNL    Lower Extremity Assessment Lower Extremity Assessment: Generalized weakness;LLE deficits/detail;RLE deficits/detail RLE Deficits / Details: 3+/5 hip flexion, knee flexion/extension, ankle DF/PF and able to wiggle toes. RLE Sensation: WNL RLE Coordination: WNL LLE Deficits / Details:  3+/5 hip flexion, knee flexion/extension, ankle DF/PF and able to wiggle toes. LLE Sensation: WNL LLE Coordination: WNL    Cervical / Trunk Assessment Cervical / Trunk Assessment: Kyphotic   Communication   Communication Communication: Impaired Factors Affecting Communication: Reduced clarity of speech    Cognition Arousal: Alert Behavior During Therapy:  (paranoid)   PT - Cognitive impairments: History of cognitive impairments     PT - Cognition Comments: pt stating she doesn't want anyone to hurt her children or grand children when asked if she would like some home health therapy. Following commands: Intact       Cueing Cueing Techniques: Verbal cues     General Comments General comments (skin integrity, edema, etc.): No noted skin issues on UE/LE and face. Did not inspect trunk area        Assessment/Plan    PT Assessment Patient does not need any further PT services  PT Problem List         PT Treatment Interventions      PT Goals (Current goals can be found in the Care Plan section)  Acute Rehab PT Goals PT Goal Formulation: All assessment and education complete, DC therapy     AM-PAC PT 6 Clicks Mobility  Outcome Measure Help needed turning from your back to your side while in a flat bed without using bedrails?: A Lot Help needed moving from lying on your back to sitting on the side of a flat bed without using bedrails?: A Lot Help needed moving to and from a bed to a chair (including a wheelchair)?: Total Help needed standing up from a chair using your arms (e.g., wheelchair or bedside chair)?: Total Help needed to walk in hospital room?: Total Help needed climbing 3-5 steps with a railing? : Total 6 Click Score: 8    End of Session   Activity Tolerance: Other (comment) (pt is self limiting) Patient left: in bed;with call bell/phone within reach;Other (comment) (provided with warm blankets, tissues and diet cola per pt request.) Nurse Communication: Mobility status;Need for lift equipment      Time: 1000-1016 PT Time Calculation (min) (ACUTE ONLY): 16 min   Charges:   PT Evaluation $PT Eval Low Complexity: 1 Low   PT General  Charges $$ ACUTE PT VISIT: 1 Visit         Dorothyann Maier, DPT, CLT  Acute Rehabilitation Services Office: [phone] (Secure chat preferred)   Dorothyann VEAR Maier 09/26/2023, 10:29 AM

## 2023-09-26 NOTE — Assessment & Plan Note (Signed)
 Given chronic indwelling Foley catheter concern for UTI or ascending infection such as pyelonephritis or obstructive uropathy.  Will obtain imaging to rule out obstructive stone since patient agreeable.  Started on Zosyn  per ED and discussion with pharmacy, will continue for broader coverage and narrow pending urine culture.  Foley catheter changed upon admission.  Otherwise clinically well-appearing with stable vitals. -Admitted to [facility] TS service, Dr. Delores attending -CT renal study -Follow-up urine culture and blood cultures -Zosyn  per pharmacy -Strict I&O -Bowel regimen: MiraLAX  twice daily, senna daily

## 2023-09-26 NOTE — H&P (Signed)
 Hospital Admission History and Physical Service Pager: [phone]  Patient name: Abigail Wallace Medical record number: [id number] Date of Birth: [birth year] Age: 78 y.o. Gender: female  Primary Care Provider: Delores Suzann HERO, MD Consultants: None Code Status: Full code Preferred Emergency Contact:  Contact Information     Name Relation Home Work [place] Son [phone]  [phone]      Other Contacts     Name Relation Home Work Mobile   [name],[name] Son [phone]  [phone]        Chief Complaint: SOB  Assessment and Plan: Abigail Wallace is a 78 y.o. female with PMHx sick sinus syndrome s/p pacemaker, A-fib, CAD, COPD, GERD, gout, CKD 3B, schizophrenia, COPD, T2DM, HTN, HFpEF meningioma presenting with chest pain, shortness of breath and flank pain.   Differential for abdominal/flank pain includes: Pyelonephritis: Risk factors includes chronic indwelling Foley catheter with recently cloudy urine and nausea/decreased p.o. intake over the past week.  Elevated lactic acid, possible concern for systemic infection.  Imaging not yet obtained. Obstructive uropathy: Per chart review does not have prior history of nephrolithiasis but cannot be excluded given risk factors as above. Constipation: Appears to have chronic issues despite taking MiraLAX  twice daily and senna.  Unlikely to completely explain clinical picture.  Differential for chest pain/SOB includes: Angina: Reports intermittent symptoms for the past couple of months relieved by nitro with dull pressure today.  Per chart review has not recently seen cardiology and may warrant further cardiac evaluation ACS: EKG reassuring, paced rhythm consistent with prior.  Troponins pending. Pneumonia: CXR showed possible concern for pneumonia but low quality image and looks similar to prior.  Nonfocal upon exam, lower concern. COPD exacerbation: Does not meet criteria given only having dyspnea as cough is  chronic without sputum production.  Does have wheezing upon exam in the setting of known emphysema.   Assessment & Plan Generalized abdominal pain Flank pain Given chronic indwelling Foley catheter concern for UTI or ascending infection such as pyelonephritis or obstructive uropathy.  Will obtain imaging to rule out obstructive stone since patient agreeable.  Started on Zosyn  per ED and discussion with pharmacy, will continue for broader coverage and narrow pending urine culture.  Foley catheter changed upon admission.  Otherwise clinically well-appearing with stable vitals. -Admitted to [facility] TS service, Dr. Delores attending -CT renal study -Follow-up urine culture and blood cultures -Zosyn  per pharmacy -Strict I&O -Bowel regimen: MiraLAX  twice daily, senna daily Chest pain Mild chest pressure upon exam, EKG reassuring but will obtain troponins to rule out ACS.  More concerning for anginal symptoms, can consider underlying ischemic evaluation.  -Follow-up troponin -Cardiac monitoring -Consider ischemic evaluation given difficulty with outpatient follow-up Dyspnea Breathing comfortably on room air with mild wheezing, likely in the setting of underlying COPD.  Exam reassuring against pneumonia, will treat symptomatically.  Appears euvolemic, will hold home Lasix  pending workup as above. -DuoNebs every 4 hours as needed -RVP -Continue home inhaler -Hold home Lasix  60 mg daily  Chronic and Stable Problems: T2DM: Hold home Ozempic .  sSSI + CBGs.  Repeat A1c. Gout: Continue home allopurinol  50 mg daily A-fib: Continue home Eliquis  2.5 mg twice daily and metoprolol  tartrate 100 mg 3 times daily Schizophrenia: Continue Depakote  250 mg twice daily and olanzapine  7.5 mg nightly CKD 3B: Continue sodium bicarb 650 mg 3 times daily HTN: Hold home amlodipine  5 mg nightly given normotension HLD: Continue home pravastatin  40 mg daily   FEN/GI: NPO VTE Prophylaxis:  Eliquis   Disposition: Med  tele  History of Present Illness:  Abigail Wallace is a 78 y.o. female presenting with shortness of breath, chest pain, flank pain.  Patient reports she had worsening shortness of breath overnight which was the cause of her coming to the ED.  Reports she has had chest pain intermittently for the past couple months and has been taking nitroglycerin  for relief.  Reports she does have a little bit of chest pressure tonight.  Additionally states she has been having some generalized abdominal pain that radiates around to her flank.  Reports she has had some nausea associated with that as well.  Has not been eating or drinking as much this past week.  Has a chronic indwelling Foley catheter.  States her son changes the Foley bag and has noted cloudy clumps for a little while now.  Denies hematuria.  Last BM 9/10, states it was small-volume.  Currently taking MiraLAX  twice per day.  Bedbound at baseline, reports her son has been unable to lift and move her since his heart attack.  Additionally having a cough that has been ongoing for weeks.  Also reports some mild congestion.  Otherwise denies fever sick contacts.  In the ED, noted to have mildly elevated white count and lactic acidosis therefore sepsis workup initiated.  Chronic indwelling Foley catheter was changed and new sample was taken.  CXR negative for secondary etiology.  Paged for admission for antibiotic management.  Per ED physician patient refused additional imaging.  Review Of Systems: Per HPI  Pertinent Past Medical History: Sick Sinus Syndrome, S/p pacemaker A-fib CAD COPD GERD Gout CKD 3b Schizophrenia COPD T2DM HTN Meningioma HFpEF (55 to 60%) Remainder reviewed in history tab.   Pertinent Past Surgical History: Pacemaker  Cardiac Cath Cholecystectomy Bilateral Tubal ligation Remainder reviewed in history tab.   Pertinent Social History: Tobacco use: Former, quit in 02/2022. Smoked on and off for 60+ years Alcohol   use: none Other Substance use: none Lives with her son [name]  Pertinent Family History: Mother and father with heart disease Sister-CVA Son- T2DM  Remainder reviewed in history tab.   Important Outpatient Medications: Albuterol  as needed Allopurinol  50 mg every other day Amlodipine  5 mg daily Eliquis  2.5 mg twice daily Depakote  250 mg twice daily Trelegy 1 puff daily Lasix  60 mg daily Metoprolol  tartrate 100 mg 3 times daily Olanzapine  7.5 mg nightly MiraLAX  3 times daily Pravastatin  40 mg daily Ozempic  0.5 mg weekly Senna 1 tablet daily Sodium bicarb 650 mg 3 times daily Remainder reviewed in medication history.   Objective: BP (!) 147/53 (BP Location: Right Arm)   Pulse 61   Temp 98.3 F (36.8 C) (Oral)   Resp 19   Ht 5' 6 (1.676 m)   Wt (!) 143.9 kg   SpO2 95%   BMI 51.20 kg/m  Exam: General: Elderly female sitting upright in bed, mildly disheveled, NAD Eyes: PERRLA, anicteric sclera ENTM: Dry lips and oral mucosa Neck: Supple, non-tender Cardiovascular: Paced rhythm regular rate.  No murmur. Respiratory: Mild expiratory wheezing at bilateral bases.  Normal work of breathing on 2.5 L [place] Gastrointestinal: Soft, nondistended.  Mild tenderness diffusely to palpation.  Positive CVA tenderness bilaterally GU: Foley catheter in place and draining small volume clear/yellow urine MSK: Nonpitting edema bilaterally Derm: Warm, dry, no rashes noted Neuro: Moves all extremities equally.  5/5 grip strength bilaterally.  5/5 dorsiflexion bilaterally. Psych: Cooperative, pleasant   Labs:  CBC BMET  Recent Labs  Lab 09/26/23  0327  WBC 14.0*  HGB 11.0*  HCT 33.6*  PLT 223   Recent Labs  Lab 09/26/23 0327  NA 132*  K 4.0  CL 98  CO2 19*  BUN 36*  CREATININE 2.53*  GLUCOSE 189*  CALCIUM  10.1    Pertinent additional labs: BNP: 402 Lactate: 2.8 > 2.7 INR: 1.0 Glucose: 189 UA: glucose, small hemoglobin, moderate leukocytes, protein, 50+ WBCs, 11-20  RBCs  EKG: None obtained, ordered.  Reviewed and shows paced rhythm similar with prior reading.   Imaging Studies Performed: DG Chest 1 View Result Date: 09/26/2023 EXAM: 1 VIEW XRAY OF THE CHEST 09/26/2023 03:10:00 AM COMPARISON: 06/15/2023 CLINICAL HISTORY: Pt BIB EMS from home with c/o cough, runny nose, chills, and right flank pain. Chronic foley cath, recently changed. FINDINGS: LUNGS AND PLEURA: Pulmonary Vascular Congestion. Left basilar opacity. Possible left pleural effusion. HEART AND MEDIASTINUM: Stable cardiomegaly. Aortic atherosclerosis. Left dual lead cardiac device with unchanged lead positioning. BONES AND SOFT TISSUES: No acute osseous abnormality. IMPRESSION: 1. Retrocardiac atelectasis or pneumonia. Possible small left pleural effusion. 2. Cardiomegaly and pulmonary vascular congestion. Electronically signed by: Norman Gatlin MD 09/26/2023 03:31 AM EDT RP Workstation: [id number]      Theophilus Pagan, MD 09/26/2023, 5:00 AM PGY-3, [facility]  FPTS Intern pager: [phone], text pages welcome Secure chat group [facility] Teaching Service

## 2023-09-26 NOTE — ED Notes (Signed)
 Pt refused rectal temp.

## 2023-09-26 NOTE — Plan of Care (Signed)
  Spoke with Tennie Ingle (Son) at [phone] [facility]) to update patient status. Son is concerning about insulin  regimen and would like patient blood glucose to be treat if blood glucose greater than 180.

## 2023-09-26 NOTE — Assessment & Plan Note (Addendum)
 T2DM: Continue Weekly Ozempic .  sSSI for each meal and at bedtime + CBGs.  Pending A1c. May need to DC ozempic  on discharge given vomiting Gout: Continue home allopurinol  50 mg daily A-fib: Continue home Eliquis  2.5 mg twice daily and metoprolol  tartrate 100 mg 3 times daily Schizophrenia: Continue Depakote  250 mg twice daily and olanzapine  7.5 mg nightly CKD 3B: Continue sodium bicarb 650 mg 3 times daily HTN: Hold home amlodipine  5 mg nightly given normotension HLD: Continue home pravastatin  40 mg daily

## 2023-09-26 NOTE — ED Notes (Signed)
 Pt transported to xray

## 2023-09-26 NOTE — Assessment & Plan Note (Addendum)
 Required 2L [place] to maintain O2 Sat greater than 95%. CXR on 09/26/23 w/ pulmonary vascular congestion.  Likely mild exacerbation of heart failure with preserved EF -DuoNebs every 4 hours as needed -COVId panel negative  -Continue home inhaler -Give IV Lasix  40 mg Once. Will plan on spot dose daily

## 2023-09-26 NOTE — Progress Notes (Signed)
 PHARMACY - PHYSICIAN COMMUNICATION CRITICAL VALUE ALERT - BLOOD CULTURE IDENTIFICATION (BCID)  Abigail Wallace is an 78 y.o. female who presented to [facility] on 09/26/2023 with a chief complaint of SOB/flank pain   Assessment:  1/2 blood cultures with staph spp. (Other set of blood cultures still pending)  -WBC 14, afebrile  Name of physician (or Provider) Contacted: Family medicine  Current antibiotics: Augmentin  500-125mg  PO BID for 5 days  Changes to prescribed antibiotics recommended:   -Continue current course  Results for orders placed or performed during the hospital encounter of 09/26/23  Blood Culture ID Panel (Reflexed) (Collected: 09/26/2023  3:27 AM)  Result Value Ref Range   Enterococcus faecalis NOT DETECTED NOT DETECTED   Enterococcus Faecium NOT DETECTED NOT DETECTED   Listeria monocytogenes NOT DETECTED NOT DETECTED   Staphylococcus species DETECTED (A) NOT DETECTED   Staphylococcus aureus (BCID) NOT DETECTED NOT DETECTED   Staphylococcus epidermidis NOT DETECTED NOT DETECTED   Staphylococcus lugdunensis NOT DETECTED NOT DETECTED   Streptococcus species NOT DETECTED NOT DETECTED   Streptococcus agalactiae NOT DETECTED NOT DETECTED   Streptococcus pneumoniae NOT DETECTED NOT DETECTED   Streptococcus pyogenes NOT DETECTED NOT DETECTED   A.calcoaceticus-baumannii NOT DETECTED NOT DETECTED   Bacteroides fragilis NOT DETECTED NOT DETECTED   Enterobacterales NOT DETECTED NOT DETECTED   Enterobacter cloacae complex NOT DETECTED NOT DETECTED   Escherichia coli NOT DETECTED NOT DETECTED   Klebsiella aerogenes NOT DETECTED NOT DETECTED   Klebsiella oxytoca NOT DETECTED NOT DETECTED   Klebsiella pneumoniae NOT DETECTED NOT DETECTED   Proteus species NOT DETECTED NOT DETECTED   Salmonella species NOT DETECTED NOT DETECTED   Serratia marcescens NOT DETECTED NOT DETECTED   Haemophilus influenzae NOT DETECTED NOT DETECTED   Neisseria meningitidis NOT DETECTED NOT  DETECTED   Pseudomonas aeruginosa NOT DETECTED NOT DETECTED   Stenotrophomonas maltophilia NOT DETECTED NOT DETECTED   Candida albicans NOT DETECTED NOT DETECTED   Candida auris NOT DETECTED NOT DETECTED   Candida glabrata NOT DETECTED NOT DETECTED   Candida krusei NOT DETECTED NOT DETECTED   Candida parapsilosis NOT DETECTED NOT DETECTED   Candida tropicalis NOT DETECTED NOT DETECTED   Cryptococcus neoformans/gattii NOT DETECTED NOT DETECTED    Lynwood Poplar, PharmD, BCPS Clinical Pharmacist 09/26/2023 10:46 PM

## 2023-09-26 NOTE — ED Provider Notes (Signed)
 [place] [facility] AT [facility] Provider Note   CSN: [id number] Arrival date & time: 09/26/23  0241     Patient presents with: Cough   Abigail Wallace is a 78 y.o. female.    Cough  Patient w/history of schizophrenia, sick sinus syndrome with pacemaker, hypertension, CHF, COPD presents with multiple complaints  Patient has chronic indwelling Foley that was recently changed, but family is concerned she has a UTI.  There is also been reports of cough chills and also having right flank pain.  Patient is a poor historian and is unable to provide much history  Past Medical History:  Diagnosis Date   Abdominal wall hernia 01/2017   Advanced care planning/counseling discussion 09/29/2021   Atrial fibrillation (HCC)    CHF (congestive heart failure) (HCC)    CKD (chronic kidney disease)    COPD (chronic obstructive pulmonary disease) (HCC)    Coronary artery disease    NON-CRITICAL   Ectasis aorta (HCC)    GERD (gastroesophageal reflux disease)    Gout    History of Sundowning 06/15/2023   Hypertension    Memory difficulties 12/02/2013   Pacemaker    Paranoid schizophrenia (HCC)    Postpolio syndrome 06/15/2023   Psychosis (HCC)    Sick sinus syndrome (HCC) 04/24/2006   Has pacemaker.         Tardive dyskinesia 04/26/2011   Type 2 diabetes mellitus (HCC)      Prior to Admission medications   Medication Sig Start Date End Date Taking? Authorizing Provider  acetaminophen  (TYLENOL ) 500 MG tablet Take 500 mg by mouth every 6 (six) hours as needed for mild pain (pain score 1-3) or headache.    [provider]  albuterol  (VENTOLIN  HFA) 108 (90 Base) MCG/ACT inhaler Inhale 2 puffs into the lungs every 6 (six) hours as needed for wheezing or shortness of breath. 09/03/23   Delores Suzann HERO, MD  allopurinol  (ZYLOPRIM ) 100 MG tablet Take 0.5 tablets (50 mg total) by mouth every other day. 09/19/23   Delores Suzann HERO, MD  amLODipine  (NORVASC ) 5 MG  tablet Take 1 tablet (5 mg total) by mouth at bedtime. Patient taking differently: Take 5 mg by mouth daily at 12 noon. 03/21/23   Delores Suzann HERO, MD  amoxicillin -clavulanate (AUGMENTIN ) 250-125 MG tablet Take 1 tablet by mouth 2 (two) times daily. 09/25/23   Delores Suzann HERO, MD  apixaban  (ELIQUIS ) 2.5 MG TABS tablet Take 1 tablet (2.5 mg total) by mouth 2 (two) times daily. 12/11/22   Delores Suzann HERO, MD  Blood Glucose Monitoring Suppl (ACCU-CHEK GUIDE) w/Device KIT Use to check blood sugar 3 times per day 08/19/23   Delores Suzann HERO, MD  cholecalciferol  (VITAMIN D3) 25 MCG (1000 UNIT) tablet Take 1 tablet (1,000 Units total) by mouth daily. 08/12/23   Rumball, Alison M, DO  diclofenac  Sodium (VOLTAREN ) 1 % GEL Apply 2 g topically 4 (four) times daily as needed (pain). 08/19/23   Delores Suzann HERO, MD  divalproex  (DEPAKOTE ) 250 MG DR tablet Take 1 tablet (250 mg total) by mouth every 12 (twelve) hours. Patient taking differently: Take 250 mg by mouth 2 (two) times daily. 03/05/22   Samtani, Jai-Gurmukh, MD  Fluticasone -Umeclidin-Vilant (TRELEGY ELLIPTA ) 100-62.5-25 MCG/ACT AEPB Inhale 1 puff into the lungs daily. Patient taking differently: Inhale 1 puff into the lungs in the morning. 05/20/23   Delores Suzann HERO, MD  furosemide  (LASIX ) 40 MG tablet Take 1.5 tablets (60 mg total) by mouth daily. 06/19/23  06/18/24  Jennelle Riis, MD  glucose blood (ACCU-CHEK GUIDE TEST) test strip Use to check blood glucose once daily and as needed for symptoms 08/19/23   Delores Suzann HERO, MD  Glucose Blood (BLOOD GLUCOSE TEST STRIPS 333) STRP Please use to check blood sugar once daily. 07/18/23   Delores Suzann HERO, MD  Insulin  Pen Needle 30G X 5 MM MISC Use to inject ozempic  weekly 07/18/23   Delores Suzann HERO, MD  ipratropium-albuterol  (DUONEB) 0.5-2.5 (3) MG/3ML SOLN Take 3 mLs by nebulization every 4 (four) hours as needed. 09/04/23   Delores Suzann HERO, MD  Lancet Device MISC Please use to check blood sugar levels once daily. 08/19/23   Delores Suzann HERO, MD  metoprolol  tartrate (LOPRESSOR ) 100 MG tablet Take 1 tablet (100 mg total) by mouth 3 (three) times daily. 09/04/23   Delores Suzann HERO, MD  Multiple Vitamin (MULTIVITAMIN WITH MINERALS) TABS tablet Take 1 tablet by mouth daily.    [provider]  nitroGLYCERIN  (NITROSTAT ) 0.4 MG SL tablet Place 1 tablet (0.4 mg total) under the tongue every 5 (five) minutes as needed for chest pain. 08/03/22 01/15/24  Croitoru, Mihai, MD  nystatin  cream (MYCOSTATIN ) Apply 1 Application topically daily as needed for dry skin (Rash).    [provider]  nystatin  powder Apply 1 Application topically daily. Apply after bathing. 08/19/23   Delores Suzann HERO, MD  OLANZapine  (ZYPREXA ) 10 MG tablet Take 7.5 mg by mouth at bedtime. 05/17/23   [provider]  OVER THE COUNTER MEDICATION Apply 1 application  topically See admin instructions. Caldesene Medicated Protecting Powder- Apply under the breasts and between the skin folds 2 times a day as needed for irritation    [provider]  polyethylene glycol powder (GLYCOLAX /MIRALAX ) 17 GM/SCOOP powder Take 17 g by mouth 3 (three) times daily. 12/11/22   Delores Suzann HERO, MD  pravastatin  (PRAVACHOL ) 40 MG tablet Take 1 tablet (40 mg total) by mouth every evening. 09/25/23   Croitoru, Mihai, MD  Semaglutide ,0.25 or 0.5MG /DOS, 2 MG/1.5ML SOPN Inject 0.5 mg into the skin once a week. Inject 0. 5mg  weekly after completing 4 doses of 0.25 mg 08/09/23   Delores Suzann HERO, MD  senna (SENOKOT) 8.6 MG TABS tablet Take 1 tablet (8.6 mg total) by mouth daily as needed for mild constipation. Patient taking differently: Take 1 tablet by mouth in the morning. 05/16/23   Delores Suzann HERO, MD  sodium bicarbonate  650 MG tablet Take 1 tablet (650 mg total) by mouth 3 (three) times daily. 09/25/23   Delores Suzann HERO, MD  triamcinolone  ointment (KENALOG ) 0.5 % Apply 1 Application topically 2 (two) times daily. Apply daily to R flank for 2 weeks Patient taking  differently: Apply 1 Application topically as needed (skin irritation). Apply daily to R flank for 2 weeks 03/21/23   Delores Suzann HERO, MD    Allergies: Aripiprazole, Clozapine , Haloperidol  lactate, Keflex  [cephalexin ], Benadryl [diphenhydramine hcl], Benztropine , Latuda [lurasidone hcl], Lorazepam , Remeron [mirtazapine], Codeine, and Latex    Review of Systems  Respiratory:  Positive for cough.     Updated Vital Signs BP (!) 147/53 (BP Location: Right Arm)   Pulse 61   Temp 98.3 F (36.8 C) (Oral)   Resp 19   Ht 1.676 m (5' 6)   Wt (!) 143.9 kg   SpO2 95%   BMI 51.20 kg/m   Physical Exam CONSTITUTIONAL: Chronically ill-appearing HEAD: Normocephalic/atraumatic EYES: EOMI/PERRL ENMT: Mucous membranes moist NECK: supple no meningeal signs SPINE/BACK:entire spine  nontender CV: S1/S2 noted, no murmurs/rubs/gallops noted LUNGS: Coarse breath sounds bilaterally, soft respiratory exam ABDOMEN: soft, nontender, protuberant, abdominal wall hernia noted that is nontender without any overlying skin change GU - catheter in place NEURO: Pt is awake/alert/appropriate, moves all extremitiesx4.  No facial droop.   EXTREMITIES: pulses normal/equal, full ROM SKIN: warm, color normal, no wounds noted to the extremities  (all labs ordered are listed, but only abnormal results are displayed) Labs Reviewed  COMPREHENSIVE METABOLIC PANEL WITH GFR - Abnormal; Notable for the following components:      Result Value   Sodium 132 (*)    CO2 19 (*)    Glucose, Bld 189 (*)    BUN 36 (*)    Creatinine, Ser 2.53 (*)    Total Protein 6.4 (*)    Albumin 3.0 (*)    GFR, Estimated 19 (*)    All other components within normal limits  CBC WITH DIFFERENTIAL/PLATELET - Abnormal; Notable for the following components:   WBC 14.0 (*)    RBC 3.72 (*)    Hemoglobin 11.0 (*)    HCT 33.6 (*)    Neutro Abs 10.2 (*)    Abs Immature Granulocytes 0.23 (*)    All other components within normal limits   URINALYSIS, W/ REFLEX TO CULTURE (INFECTION SUSPECTED) - Abnormal; Notable for the following components:   APPearance CLOUDY (*)    Glucose, UA >=500 (*)    Hgb urine dipstick SMALL (*)    Protein, ur >=300 (*)    Leukocytes,Ua MODERATE (*)    Bacteria, UA MANY (*)    All other components within normal limits  BRAIN NATRIURETIC PEPTIDE - Abnormal; Notable for the following components:   B Natriuretic Peptide 402.3 (*)    All other components within normal limits  VALPROIC ACID  LEVEL - Abnormal; Notable for the following components:   Valproic Acid  Lvl 23 (*)    All other components within normal limits  I-STAT CG4 LACTIC ACID, ED - Abnormal; Notable for the following components:   Lactic Acid, Venous 2.8 (*)    All other components within normal limits  I-STAT CG4 LACTIC ACID, ED - Abnormal; Notable for the following components:   Lactic Acid, Venous 2.7 (*)    All other components within normal limits  RESP PANEL BY RT-PCR (RSV, FLU A&B, COVID)  RVPGX2  CULTURE, BLOOD (ROUTINE X 2)  CULTURE, BLOOD (ROUTINE X 2)  RESPIRATORY PANEL BY PCR  PROTIME-INR  BASIC METABOLIC PANEL WITH GFR  CBC  TROPONIN I (HIGH SENSITIVITY)    EKG: EKG Interpretation Date/Time:  Thursday September 26 2023 05:52:03 EDT Ventricular Rate:  66 PR Interval:    QRS Duration:  171 QT Interval:  480 QTC Calculation: 503 R Axis:   -72  Text Interpretation: paced rhythm Nonspecific IVCD with LAD LVH with secondary repolarization abnormality Confirmed by Midge Golas ([id number]) on 09/26/2023 6:06:05 AM  Radiology: DG Chest 1 View Result Date: 09/26/2023 EXAM: 1 VIEW XRAY OF THE CHEST 09/26/2023 03:10:00 AM COMPARISON: 06/15/2023 CLINICAL HISTORY: Pt BIB EMS from home with c/o cough, runny nose, chills, and right flank pain. Chronic foley cath, recently changed. FINDINGS: LUNGS AND PLEURA: Pulmonary Vascular Congestion. Left basilar opacity. Possible left pleural effusion. HEART AND MEDIASTINUM: Stable  cardiomegaly. Aortic atherosclerosis. Left dual lead cardiac device with unchanged lead positioning. BONES AND SOFT TISSUES: No acute osseous abnormality. IMPRESSION: 1. Retrocardiac atelectasis or pneumonia. Possible small left pleural effusion. 2. Cardiomegaly and pulmonary vascular congestion. Electronically signed by:  Norman Gatlin MD 09/26/2023 03:31 AM EDT RP Workstation: [id number]     Procedures   Medications Ordered in the ED  allopurinol  (ZYLOPRIM ) tablet 50 mg (has no administration in time range)  pravastatin  (PRAVACHOL ) tablet 40 mg (has no administration in time range)  OLANZapine  (ZYPREXA ) tablet 7.5 mg (has no administration in time range)  polyethylene glycol powder (GLYCOLAX /MIRALAX ) container 17 g (has no administration in time range)  senna (SENOKOT) tablet 8.6 mg (has no administration in time range)  sodium bicarbonate  tablet 650 mg (has no administration in time range)  divalproex  (DEPAKOTE ) DR tablet 250 mg (has no administration in time range)  budesonide -glycopyrrolate -formoterol  (BREZTRI ) 160-9-4.8 MCG/ACT inhaler 2 puff (has no administration in time range)  apixaban  (ELIQUIS ) tablet 2.5 mg (has no administration in time range)  metoprolol  tartrate (LOPRESSOR ) tablet 100 mg (has no administration in time range)  insulin  aspart (novoLOG ) injection 0-9 Units (has no administration in time range)  insulin  aspart (novoLOG ) injection 0-5 Units (has no administration in time range)  ipratropium-albuterol  (DUONEB) 0.5-2.5 (3) MG/3ML nebulizer solution 3 mL (has no administration in time range)  sodium chloride  0.9 % bolus 1,000 mL (0 mLs Intravenous Stopped 09/26/23 0456)  piperacillin -tazobactam (ZOSYN ) IVPB 3.375 g (0 g Intravenous Stopped 09/26/23 0527)    Clinical Course as of 09/26/23 0606  Thu Sep 26, 2023  0410 Patient multiple chronic medical conditions including CHF, hypertension, schizophrenia presents with multiple issues.  It is reports she has had recent  cough, foul-smelling urine and flank pain.  On reassessment, patient is awake and alert.  She reports mild flank pain but is otherwise in no acute distress.  She declines any CT imaging of her abdomen.  She does have a large hernia in her abdomen but is nontender   Nursing staff changed out her Foley catheter, urinalysis pending.  It is possible she has an underlying urinary tract infection and was just recently started on antibiotics.  Lactate is mildly elevated as well as white count, will start IV fluids. [DW]  [phone] Discussed the case with her son Fairy) who is her primary caregiver He reports the patient has had flulike illness with cough and congestion but also reporting flank pain and foul-smelling urine No other acute issues [DW]  [phone] Discussed the case with the pharmacist, they recommend using Zosyn  for the UTI [DW]  0515 Discussed the case with on-call family medicine resident.  They will admit the patient. Patient declined CT imaging, and overall at this time she is resting comfortably.  At some point she may need imaging to evaluate for any obstructive uropathy. [DW]    Clinical Course User Index [DW] Midge Golas, MD                                 Medical Decision Making Amount and/or Complexity of Data Reviewed Labs: ordered. Radiology: ordered. ECG/medicine tests: ordered.  Risk Prescription drug management. Decision regarding hospitalization.   This patient presents to the ED for concern of flank pain, this involves an extensive number of treatment options, and is a complaint that carries with it a high risk of complications and morbidity.  The differential diagnosis includes but is not limited to muscle strain, pyelonephritis, kidney stone, AAA, abscess  Comorbidities that complicate the patient evaluation: Patient's presentation is complicated by their history of hypertension, CHF  Social Determinants of Health: Patient's history of schizophrenia  increases  the complexity of managing their  presentation  Additional history obtained: Records reviewed Primary Care Documents  Lab Tests: I Ordered, and personally interpreted labs.  The pertinent results include: Leukocytosis, UTI  Imaging Studies ordered: I ordered imaging studies including X-ray chest  I independently visualized and interpreted imaging which showed cardiomegaly, ? Pneumonia I agree with the radiologist interpretation  Cardiac Monitoring: The patient was maintained on a cardiac monitor.  I personally viewed and interpreted the cardiac monitor which showed an underlying rhythm of: Paced rhythm  Medicines ordered and prescription drug management: I ordered medication including IV zosyn   for uti   Critical Interventions:  IV antibiotics and admission  Consultations Obtained: I requested consultation with the admitting physician family medicine resident and consultant pharmacist, and discussed  findings as well as pertinent plan - they recommend: admit, start IV zosyn   Reevaluation: After the interventions noted above, I reevaluated the patient and found that they have :stayed the same  Complexity of problems addressed: Patient's presentation is most consistent with  acute presentation with potential threat to life or bodily function  Disposition: After consideration of the diagnostic results and the patient's response to treatment,  I feel that the patent would benefit from admission  .    Lactate minimally changed, but had not received fluids Patient may also have pneumonia on x-ray    Final diagnoses:  Urinary tract infection associated with indwelling urethral catheter, initial encounter [facility])    ED Discharge Orders     None          Midge Golas, MD 09/26/23 [phone]

## 2023-09-26 NOTE — Telephone Encounter (Signed)
 Patient currently admitted to [facility] teaching service.   Spoke with Delores, no need to call for urine culture results (again.)

## 2023-09-26 NOTE — Progress Notes (Addendum)
 Daily Progress Note Intern Pager: [phone]  Patient name: Abigail Wallace Medical record number: [id number] Date of birth: [birth year] Age: 78 y.o. Gender: female  Primary Care Provider: Delores Suzann HERO, MD Consultants: None Code Status: Full Code  Pt Overview and Major Events to Date:  09/26/23 : Admitted to FMTS service for abdominal pain and SBO  Pertinent PMH/PSH includes Sick Sinus Syndrome, S/p pacemaker A-fib CAD COPD GERD Gout CKD 3b Schizophrenia COPD T2DM HTN Meningioma HFpEF (55 to 60%) Assessment & Plan Generalized abdominal pain Chronic Foley catheter use CTAP shows stable aneurysm, L sided cyst. Received IV Zosyn  in ED - Continue foley  - Blood cultures with no growth to date - Pending Urine culture - Strict I&O - Starting AUGMENTIN  500-125 MG Q12H for 5 days ( 09/26/23 - 09/30/23) - Pain : Tylenol  650 mg Q4H PRN - Renal ultrasound for cyst  Chest pain This is chronic and reproducible, suspect component of COPD/Anxiety. Mild chest pressure upon exam, EKG reassuring.Trop was 19 ( normal ) Last Echo was 06/2023 w/ EF 55-60% - Continue Cardiac monitoring  Dyspnea Required 2L [place] to maintain O2 Sat greater than 95%. CXR on 09/26/23 w/ pulmonary vascular congestion.  Likely mild exacerbation of heart failure with preserved EF -DuoNebs every 4 hours as needed -COVId panel negative  -Continue home inhaler -Give IV Lasix  40 mg Once. Will plan on spot dose daily Constipation CTAP w/ contrast on 9/11 shows stool burden. Unknown last BM per patient  - DULCOLAX SR 10 mg once - Continue MIRALAX  17 g BID - Continue SENOKOT 8.6 mg 1 tablet daily  Chronic health problem T2DM: Continue Weekly Ozempic .  sSSI for each meal and at bedtime + CBGs.  Pending A1c. May need to DC ozempic  on discharge given vomiting Gout: Continue home allopurinol  50 mg daily A-fib: Continue home Eliquis  2.5 mg twice daily and metoprolol  tartrate 100 mg 3 times daily Schizophrenia:  Continue Depakote  250 mg twice daily and olanzapine  7.5 mg nightly CKD 3B: Continue sodium bicarb 650 mg 3 times daily HTN: Hold home amlodipine  5 mg nightly given normotension HLD: Continue home pravastatin  40 mg daily    FEN/GI: Regular diet  PPx: Eliquis   Dispo:Pending PT recommendations    Subjective:    Objective: Temp:  [97.6 F (36.4 C)-98.3 F (36.8 C)] 97.6 F (36.4 C) (09/11 1027) Pulse Rate:  [60-75] 70 (09/11 1027) Resp:  [15-20] 15 (09/11 1027) BP: (142-169)/(53-111) 157/73 (09/11 1027) SpO2:  [92 %-100 %] 100 % (09/11 1027) Weight:  [143.9 kg] 143.9 kg (09/11 0249)   Physical Exam: Physical Exam Cardiovascular:     Rate and Rhythm: Normal rate.     Pulses: Normal pulses.     Heart sounds: Normal heart sounds.     Comments: Pacing at 60bmp Pulmonary:     Effort: Pulmonary effort is normal.     Breath sounds: Rhonchi present.     Comments: On 2L [place] Abdominal:     Palpations: Abdomen is soft.  Genitourinary:    Comments: W/ Foley catheter Skin:    Capillary Refill: Capillary refill takes less than 2 seconds.  Neurological:     Mental Status: She is oriented to person, place, and time.  Psychiatric:        Mood and Affect: Mood normal.        Behavior: Behavior normal.      Laboratory: Most recent CBC Lab Results  Component Value Date   WBC 14.0 (H) 09/26/2023  HGB 11.0 (L) 09/26/2023   HCT 33.6 (L) 09/26/2023   MCV 90.3 09/26/2023   PLT 223 09/26/2023   Most recent BMP    Latest Ref Rng & Units 09/26/2023    3:27 AM  BMP  Glucose 70 - 99 mg/dL 810   BUN 8 - 23 mg/dL 36   Creatinine 9.55 - 1.00 mg/dL 7.46   Sodium 864 - 854 mmol/L 132   Potassium 3.5 - 5.1 mmol/L 4.0   Chloride 98 - 111 mmol/L 98   CO2 22 - 32 mmol/L 19   Calcium  8.9 - 10.3 mg/dL 89.8      Imaging/Diagnostic Tests: EXAM: CT UROGRAM 09/26/2023 06:50:16 AM   IMPRESSION: 1. No nephrolithiasis or obstructive uropathy. 2. Infrarenal abdominal aortic aneurysm  measuring 3.8 cm. According to consensus criteria follow-up imaging in 24 months is recommended 3. Partially visualized, large ventral abdominal wall hernia containing the mid transverse colon without signs of obstruction. 4. Unchanged complex cyst of the posterior cortex of the left kidney which may reflect a hemorrhagic or proteinaceous cyst, but is incompletely characterized without iv contrast.   Electronically signed by: Waddell Calk MD 09/26/2023 07:17 AM EDT RP Workstation: [id number].  Suzen Houston NOVAK, DO 09/26/2023, 12:13 PM  PGY-1, [facility] FPTS Intern pager: [phone], text pages welcome Secure chat group [facility] Teaching Service

## 2023-09-26 NOTE — Assessment & Plan Note (Signed)
 Mild chest pressure upon exam, EKG reassuring but will obtain troponins to rule out ACS.  More concerning for anginal symptoms, can consider underlying ischemic evaluation.  -Follow-up troponin -Cardiac monitoring -Consider ischemic evaluation given difficulty with outpatient follow-up

## 2023-09-26 NOTE — ED Notes (Signed)
 CCMD called.

## 2023-09-26 NOTE — ED Notes (Signed)
 Just assumed care of PT and now PT has a room upstairs. Receiving floor called and made aware that PT is on the way up

## 2023-09-26 NOTE — Assessment & Plan Note (Signed)
 Breathing comfortably on room air with mild wheezing, likely in the setting of underlying COPD.  Exam reassuring against pneumonia, will treat symptomatically.  Appears euvolemic, will hold home Lasix  pending workup as above. -DuoNebs every 4 hours as needed -RVP -Continue home inhaler -Hold home Lasix  60 mg daily

## 2023-09-26 NOTE — Hospital Course (Addendum)
 Abigail Wallace is a 78 y.o.female with a history of sick sinus syndrome s/p pacemaker, A-fib, CAD, COPD, GERD, gout, CKD 3B, schizophrenia, COPD, T2DM, HTN, HFpEF, meningioma  who was admitted to the [facility] Medicine Teaching Service at [facility] for SOB. Her hospital course is detailed below:  UTI associated w/ indwelling urethral catheter  Patient presented w/ abdominal and flank pain and given chronic indwelling catheter there was concern for UTI/ascending infection.  Initially started on Zosyn  in the ED for broad coverage and narrowed to Augmentin  based on cultures/sensitivities. Urine culture grew Staphylococcus species.  5-day course of Augmentin  500-125 mg BID (9/11 - 9/15) to treat infection.  Dyspnea Presented w/ SOB. She was placed on 2L [place] for comfort on admission, but on RA at discharge. She has been using oxygen  PRN per her son at home. CXR on 09/26/23 w/ pulmonary vascular congestion.  Likely mild exacerbation of heart failure with preserved EF from Echo on 06/2023. Received 40 IV Lasix  on 9/11 and transitioned back to her home dose prior to discharge, diuresing well.  Generalized abdominal pain  L kidney hemorrhagic cyst Patient presented in ED w/ abdominal pain. CT Renal shows stable aneurysm, large ventral abdominal wall hernia containing mid transverse colon w/o obstruction, and unchanged complex cyst of the posterior cortex of the left kidney which may reflect a hemorrhagic or proteinaceous cyst. Renal US  performed which demonstrated multiple exophytic cysts in the R kidney and the L kidney was poorly visualized.  Other chronic conditions were medically managed with home medications and formulary alternatives as necessary (T2DM, Gout, A-fib, Schizophrenia, CKD3b, HTN, HLD)  PCP Follow-up Recommendations: Aortic aneurysm follow up in 2 years F/u with fluid status F/u w/ HTN management  Discuss switching Ozempic  back to Tradjenta  with family.

## 2023-09-26 NOTE — Plan of Care (Signed)
 Patient arrived from the unit, aox4, on 2LPm via [place]. Son at bedside. Assessed accordingly. Call bell within reached. Bed alarm on. Problem: Education: Goal: Ability to describe self-care measures that may prevent or decrease complications (Diabetes Survival Skills Education) will improve Outcome: Progressing Goal: Individualized Educational Video(s) Outcome: Progressing   Problem: Coping: Goal: Ability to adjust to condition or change in health will improve Outcome: Progressing   Problem: Fluid Volume: Goal: Ability to maintain a balanced intake and output will improve Outcome: Progressing   Problem: Health Behavior/Discharge Planning: Goal: Ability to identify and utilize available resources and services will improve Outcome: Progressing Goal: Ability to manage health-related needs will improve Outcome: Progressing   Problem: Metabolic: Goal: Ability to maintain appropriate glucose levels will improve Outcome: Progressing   Problem: Nutritional: Goal: Maintenance of adequate nutrition will improve Outcome: Progressing Goal: Progress toward achieving an optimal weight will improve Outcome: Progressing   Problem: Skin Integrity: Goal: Risk for impaired skin integrity will decrease Outcome: Progressing   Problem: Tissue Perfusion: Goal: Adequacy of tissue perfusion will improve Outcome: Progressing   Problem: Education: Goal: Knowledge of General Education information will improve Description: Including pain rating scale, medication(s)/side effects and non-pharmacologic comfort measures Outcome: Progressing   Problem: Health Behavior/Discharge Planning: Goal: Ability to manage health-related needs will improve Outcome: Progressing   Problem: Clinical Measurements: Goal: Ability to maintain clinical measurements within normal limits will improve Outcome: Progressing Goal: Will remain free from infection Outcome: Progressing Goal: Diagnostic test results will  improve Outcome: Progressing Goal: Respiratory complications will improve Outcome: Progressing Goal: Cardiovascular complication will be avoided Outcome: Progressing   Problem: Activity: Goal: Risk for activity intolerance will decrease Outcome: Progressing   Problem: Nutrition: Goal: Adequate nutrition will be maintained Outcome: Progressing   Problem: Coping: Goal: Level of anxiety will decrease Outcome: Progressing   Problem: Elimination: Goal: Will not experience complications related to bowel motility Outcome: Progressing Goal: Will not experience complications related to urinary retention Outcome: Progressing   Problem: Pain Managment: Goal: General experience of comfort will improve and/or be controlled Outcome: Progressing   Problem: Safety: Goal: Ability to remain free from injury will improve Outcome: Progressing   Problem: Skin Integrity: Goal: Risk for impaired skin integrity will decrease Outcome: Progressing

## 2023-09-26 NOTE — ED Notes (Signed)
 Pt and son over the phone refused insulin . Son states they try to stay away from insulin . Pt was advised that the insulin  was ordered from the physician and that her CBG was 146. Pt and son understands and both refuse.

## 2023-09-26 NOTE — ED Triage Notes (Signed)
 Pt BIB EMS from home with c/o cough, runny nose, chills, and right flank pain. Chronic foley cath, recently changed.   140/80 60HR 97RA 162cbg

## 2023-09-26 NOTE — ED Notes (Signed)
 MD made aware pt requesting pain medication. Pain in hips and spine 10/10.

## 2023-09-26 NOTE — Assessment & Plan Note (Addendum)
 CTAP shows stable aneurysm, L sided cyst. Received IV Zosyn  in ED - Continue foley  - Blood cultures with no growth to date - Pending Urine culture - Strict I&O - Starting AUGMENTIN  500-125 MG Q12H for 5 days ( 09/26/23 - 09/30/23) - Pain : Tylenol  650 mg Q4H PRN - Renal ultrasound for cyst

## 2023-09-26 NOTE — Assessment & Plan Note (Addendum)
 CTAP w/ contrast on 9/11 shows stool burden. Unknown last BM per patient  - DULCOLAX SR 10 mg once - Continue MIRALAX  17 g BID - Continue SENOKOT 8.6 mg 1 tablet daily

## 2023-09-26 NOTE — Assessment & Plan Note (Addendum)
 This is chronic and reproducible, suspect component of COPD/Anxiety. Mild chest pressure upon exam, EKG reassuring.Trop was 19 ( normal ) Last Echo was 06/2023 w/ EF 55-60% - Continue Cardiac monitoring

## 2023-09-27 ENCOUNTER — Inpatient Hospital Stay (HOSPITAL_COMMUNITY)

## 2023-09-27 ENCOUNTER — Encounter: Payer: Self-pay | Admitting: Family Medicine

## 2023-09-27 DIAGNOSIS — N281 Cyst of kidney, acquired: Secondary | ICD-10-CM | POA: Diagnosis not present

## 2023-09-27 DIAGNOSIS — T83511A Infection and inflammatory reaction due to indwelling urethral catheter, initial encounter: Secondary | ICD-10-CM | POA: Diagnosis not present

## 2023-09-27 DIAGNOSIS — N390 Urinary tract infection, site not specified: Secondary | ICD-10-CM | POA: Diagnosis not present

## 2023-09-27 DIAGNOSIS — J411 Mucopurulent chronic bronchitis: Secondary | ICD-10-CM

## 2023-09-27 LAB — CBC
HCT: 31.6 % — ABNORMAL LOW (ref 36.0–46.0)
Hemoglobin: 10.3 g/dL — ABNORMAL LOW (ref 12.0–15.0)
MCH: 29.4 pg (ref 26.0–34.0)
MCHC: 32.6 g/dL (ref 30.0–36.0)
MCV: 90.3 fL (ref 80.0–100.0)
Platelets: 205 K/uL (ref 150–400)
RBC: 3.5 MIL/uL — ABNORMAL LOW (ref 3.87–5.11)
RDW: 15.1 % (ref 11.5–15.5)
WBC: 12.4 K/uL — ABNORMAL HIGH (ref 4.0–10.5)
nRBC: 0 % (ref 0.0–0.2)

## 2023-09-27 LAB — HEMOGLOBIN A1C
Hgb A1c MFr Bld: 6.5 % — ABNORMAL HIGH (ref 4.8–5.6)
Mean Plasma Glucose: 139.85 mg/dL

## 2023-09-27 LAB — GLUCOSE, CAPILLARY
Glucose-Capillary: 118 mg/dL — ABNORMAL HIGH (ref 70–99)
Glucose-Capillary: 272 mg/dL — ABNORMAL HIGH (ref 70–99)
Glucose-Capillary: 136 mg/dL — ABNORMAL HIGH (ref 70–99)
Glucose-Capillary: 137 mg/dL — ABNORMAL HIGH (ref 70–99)

## 2023-09-27 LAB — BASIC METABOLIC PANEL WITH GFR
Anion gap: 13 (ref 5–15)
BUN: 39 mg/dL — ABNORMAL HIGH (ref 8–23)
CO2: 19 mmol/L — ABNORMAL LOW (ref 22–32)
Calcium: 9 mg/dL (ref 8.9–10.3)
Chloride: 106 mmol/L (ref 98–111)
Creatinine, Ser: 2.66 mg/dL — ABNORMAL HIGH (ref 0.44–1.00)
GFR, Estimated: 18 mL/min — ABNORMAL LOW (ref >60)
Glucose, Bld: 144 mg/dL — ABNORMAL HIGH (ref 70–99)
Potassium: 3.8 mmol/L (ref 3.5–5.1)
Sodium: 138 mmol/L (ref 135–145)

## 2023-09-27 MED ORDER — FUROSEMIDE 40 MG PO TABS
60.0000 mg | ORAL_TABLET | Freq: Every day | ORAL | Status: DC
Start: 2023-09-27 — End: 2023-09-28
  Administered 2023-09-27: 60 mg via ORAL
  Filled 2023-09-27: qty 1

## 2023-09-27 NOTE — Assessment & Plan Note (Addendum)
 CTAP w/ contrast on 9/11 shows stool burden. Unknown last BM per patient. Last BM was yesterday 09/26/23 - Continue MIRALAX  17 g BID - Continue SENOKOT 8.6 mg 1 tablet daily

## 2023-09-27 NOTE — Assessment & Plan Note (Addendum)
-   Blood cultures likely contaiminant - Urine culture- follow up final results  - AUGMENTIN  500-125 MG Q12H for 5 days ( 09/26/23 - 09/30/23) - Continue foley, changed on admission  - Strict I&O - Renal US  pending  - Pain : Tylenol  650 mg Q4H PRN

## 2023-09-27 NOTE — Progress Notes (Addendum)
 Daily Progress Note Intern Pager: [phone]  Patient name: Abigail Wallace Medical record number: [id number] Date of birth: 05-29-1945 Age: 78 y.o. Gender: female  Primary Care Provider: Delores Suzann HERO, MD Consultants: None Code Status: Full Code  Pt Overview and Major Events to Date:  09/26/23 : Admitted to FMTS service for abdominal pain and SBO  Assessment & Plan Urinary tract infection associated with indwelling urethral catheter (HCC) Generalized abdominal pain Chronic Foley catheter use - Blood cultures likely contaiminant - Urine culture- follow up final results  - AUGMENTIN  500-125 MG Q12H for 5 days ( 09/26/23 - 09/30/23) - Continue foley, changed on admission  - Strict I&O - Renal US  pending  - Pain : Tylenol  650 mg Q4H PRN Dyspnea On RA this morning. CXR on 09/26/23 w/ pulmonary vascular congestion.  Likely mild exacerbation of heart failure with preserved EF from Echo on 06/2023. Received 40 IV Lasix  yesterday.  -DuoNebs every 4 hours as needed -COVId panel negative  -Continue home inhaler - Starting back on home PO Lasix  60 mg daily Constipation CTAP w/ contrast on 9/11 shows stool burden. Unknown last BM per patient. Last BM was yesterday 09/26/23 - Continue MIRALAX  17 g BID - Continue SENOKOT 8.6 mg 1 tablet daily  Chronic health problem T2DM: Continue Weekly Ozempic .  sSSI for each meal and at bedtime + CBGs.  Pending A1c. May need to DC ozempic  on discharge given vomiting Gout: Continue home allopurinol  50 mg daily A-fib: Continue home Eliquis  2.5 mg twice daily and metoprolol  tartrate 100 mg 3 times daily Schizophrenia: Continue Depakote  250 mg twice daily and olanzapine  7.5 mg nightly CKD 3B: Continue sodium bicarb 650 mg 3 times daily HTN: Hold home amlodipine  5 mg nightly given normotension HLD: Continue home pravastatin  40 mg daily    FEN/GI: Regular diet  PPx: Eliquis   Dispo:Pending PT recommendations    Subjective:    Objective: Temp:  [97.5  F (36.4 C)-98.6 F (37 C)] 97.7 F (36.5 C) (09/12 0836) Pulse Rate:  [62-75] 67 (09/12 0836) Resp:  [15-20] 17 (09/12 0836) BP: (142-165)/(50-73) 142/60 (09/12 0836) SpO2:  [95 %-100 %] 98 % (09/12 0836)   Physical Exam: Physical Exam Cardiovascular:     Rate and Rhythm: Normal rate.     Pulses: Normal pulses.     Heart sounds: Normal heart sounds.     Comments: Pacing at 60bmp Pulmonary:     Effort: Pulmonary effort is normal.     Breath sounds: Rhonchi present.     Comments: On RA Abdominal:     Palpations: Abdomen is soft.  Genitourinary:    Comments: W/ Foley catheter Skin:    Capillary Refill: Capillary refill takes less than 2 seconds.  Neurological:     Mental Status: She is oriented to person, place, and time.  Psychiatric:        Mood and Affect: Mood normal.        Behavior: Behavior normal.      Laboratory: Most recent CBC Lab Results  Component Value Date   WBC 12.4 (H) 09/27/2023   HGB 10.3 (L) 09/27/2023   HCT 31.6 (L) 09/27/2023   MCV 90.3 09/27/2023   PLT 205 09/27/2023   Most recent BMP    Latest Ref Rng & Units 09/27/2023    3:56 AM  BMP  Glucose 70 - 99 mg/dL 855   BUN 8 - 23 mg/dL 39   Creatinine 9.55 - 1.00 mg/dL 7.33   Sodium 864 -  145 mmol/L 138   Potassium 3.5 - 5.1 mmol/L 3.8   Chloride 98 - 111 mmol/L 106   CO2 22 - 32 mmol/L 19   Calcium  8.9 - 10.3 mg/dL 9.0      Imaging/Diagnostic Tests: EXAM: CT UROGRAM 09/26/2023 06:50:16 AM   IMPRESSION: 1. No nephrolithiasis or obstructive uropathy. 2. Infrarenal abdominal aortic aneurysm measuring 3.8 cm. According to consensus criteria follow-up imaging in 24 months is recommended 3. Partially visualized, large ventral abdominal wall hernia containing the mid transverse colon without signs of obstruction. 4. Unchanged complex cyst of the posterior cortex of the left kidney which may reflect a hemorrhagic or proteinaceous cyst, but is incompletely characterized without iv  contrast.   Electronically signed by: Waddell Calk MD 09/26/2023 07:17 AM EDT RP Workstation: [id number].  EXAM: CT UROGRAM 09/26/2023 06:50:16 AM IMPRESSION: 1. No nephrolithiasis or obstructive uropathy. 2. Infrarenal abdominal aortic aneurysm measuring 3.8 cm. According to consensus criteria follow-up imaging in 24 months is recommended 3. Partially visualized, large ventral abdominal wall hernia containing the mid transverse colon without signs of obstruction. 4. Unchanged complex cyst of the posterior cortex of the left kidney which may reflect a hemorrhagic or proteinaceous cyst, but is incompletely characterized without iv contrast.  Suzen Elder B, DO 09/27/2023, 9:32 AM  PGY-1, [facility] FPTS Intern pager: [phone], text pages welcome Secure chat group [facility] Teaching Service

## 2023-09-27 NOTE — Plan of Care (Signed)
  Spoke with Tennie Ingle (Son) at [phone] [facility]) to update patient status. Mr. Tennie would like to have sometime to discuss with his mom about switching Ozempic  back to Tradjenta  and will discuss this with Dr. Delores during patient's next home visit. Mr. Tennie would like to proceed on his mom Abigail Wallace  and ready for her to be discharged tomorrow.

## 2023-09-27 NOTE — TOC Initial Note (Signed)
 Transition of Care (TOC) - Initial/Assessment Note   Spoke to patient and son [name] at bedside. [name] is patient's primary caregiver.   Patient has hoyer lift, wheelchair , NEB Machine and oxygen  at home.   [name] was told by MD anticipated discharge date is tomorrow. Patient will need ambulance transport home. NCM confirmed address.   [name] would like to be called when ambulance service leaves hospital with his mom. NCM placed information in handoff and secure chatted nurse asking if she can also pass along in report.    Patient Details  Name: Abigail Wallace MRN: [id number] Date of Birth: 1946-01-15  [facility]) CM/SW Contact:    Stephane Powell Jansky, RN Phone Number: 09/27/2023, 11:54 AM  Clinical Narrative:                   Expected Discharge Plan: Home/Self Care Barriers to Discharge: Continued Medical Work up   Patient Goals and CMS Choice Patient states their goals for this hospitalization and ongoing recovery are:: to return to home   Choice offered to / list presented to : NA      Expected Discharge Plan and Services   Discharge Planning Services: CM Consult Post Acute Care Choice: NA Living arrangements for the past 2 months: Apartment                 DME Arranged: N/A DME Agency: NA       HH Arranged: NA HH Agency: NA        Prior Living Arrangements/Services Living arrangements for the past 2 months: Apartment Lives with:: Adult Children Patient language and need for interpreter reviewed:: Yes Do you feel safe going back to the place where you live?: Yes      Need for Family Participation in Patient Care: Yes (Comment) Care giver support system in place?: Yes (comment) Current home services: DME Criminal Activity/Legal Involvement Pertinent to Current Situation/Hospitalization: No - Comment as needed  Activities of Daily Living   ADL Screening (condition at time of admission) Independently performs ADLs?: No Does the patient have a NEW  difficulty with bathing/dressing/toileting/self-feeding that is expected to last >3 days?: Yes (Initiates electronic notice to provider for possible OT consult) Does the patient have a NEW difficulty with getting in/out of bed, walking, or climbing stairs that is expected to last >3 days?: No Does the patient have a NEW difficulty with communication that is expected to last >3 days?: No Is the patient deaf or have difficulty hearing?: No Does the patient have difficulty seeing, even when wearing glasses/contacts?: No Does the patient have difficulty concentrating, remembering, or making decisions?: No  Permission Sought/Granted   Permission granted to share information with : Yes, Verbal Permission Granted  Share Information with NAME: son [name]           Emotional Assessment Appearance:: Appears stated age Attitude/Demeanor/Rapport: Engaged Affect (typically observed): Appropriate Orientation: : Oriented to Self, Oriented to Place, Oriented to  Time, Oriented to Situation Alcohol  / Substance Use: Not Applicable Psych Involvement: No (comment)  Admission diagnosis:  Chest pain [R07.9] Urinary tract infection associated with indwelling urethral catheter, initial encounter (HCC) [U16.488J, N39.0] Patient Active Problem List   Diagnosis Date Noted   Generalized abdominal pain 09/26/2023   Urinary tract infection associated with indwelling urethral catheter (HCC) 09/26/2023   Heart failure with preserved ejection fraction (HCC) 08/09/2023   Chronic health problem 06/14/2023   Meningioma (HCC) 04/06/2022   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  Cholesteatoma 12/24/2021   Advanced care planning/counseling discussion 09/29/2021   Chronic back pain 11/14/2018   Constipation 05/26/2018   Pulmonary nodules 03/03/2018   Paroxysmal atrial fibrillation (HCC) s/p cardioversion  01/28/2018   Dependence on wheelchair 05/17/2017   Abdominal aortic ectasia (HCC) 01/16/2017   Renal cyst  01/16/2017   CKD stage IIIb 09/12/2016   Dyspnea 04/15/2015   Well controlled type 2 diabetes mellitus (HCC) 03/20/2012   Gout 09/26/2011   Senile cataracts of both eyes 05/03/2011   Essential hypertension 04/26/2011   Vaginal prolapse 03/27/2010   Obstructive sleep apnea 03/27/2010   Chronic Foley catheter use 03/27/2010   Osteoarthritis of right knee 03/27/2010   Resting tremor 12/15/2009   Sick sinus syndrome (HCC) 04/24/2006   Schizophrenia (HCC) 03/14/2006   Chronic obstructive pulmonary disease (HCC) 03/14/2006   PCP:  Delores Suzann HERO, MD Pharmacy:   [facility] - [place], [place] - [address] [facility] [address] [place] [address] Phone: [phone] Fax: [phone]  [name] [address] [place] [address] Phone: [phone] Fax: [phone]     Social Drivers of Health (SDOH) Social History: SDOH Screenings   Food Insecurity: No Food Insecurity (09/26/2023)  Housing: Low Risk  (09/26/2023)  Transportation Needs: No Transportation Needs (09/26/2023)  Utilities: Not At Risk (09/26/2023)  Alcohol  Screen: Low Risk  (12/27/2020)  Depression (PHQ2-9): High Risk (02/22/2022)  Financial Resource Strain: Low Risk  (03/23/2022)  Physical Activity: Inactive (12/27/2020)  Social Connections: Socially Isolated (09/26/2023)  Stress: No Stress Concern Present (12/27/2020)  Tobacco Use: Medium Risk (09/26/2023)   SDOH Interventions:     Readmission Risk Interventions    06/19/2023    9:04 AM  Readmission Risk Prevention Plan  Transportation Screening Complete  PCP or Specialist Appt within 5-7 Days Complete  Home Care Screening Complete  Medication Review (RN CM) Referral to Pharmacy

## 2023-09-27 NOTE — Discharge Summary (Shared)
 Family Medicine Teaching [facility] Discharge Summary  Patient name: Abigail Wallace Medical record number: [id number] Date of birth: 04-05-1945 Age: 78 y.o. Gender: female Date of Admission: 09/26/2023  Date of Discharge: 09/28/23  Admitting Physician: Suzann CHRISTELLA Daring, MD  Primary Care Provider: Daring Suzann CHRISTELLA, MD Consultants: None  Indication for Hospitalization: Abdominal/flank/chest pain and dyspnea  Discharge Diagnoses/Problem List:  Principal Problem for Admission: Dyspnea Other Problems addressed during stay:  Active Problems:   Chronic Foley catheter use   Dyspnea   Constipation   Generalized abdominal pain   Urinary tract infection associated with indwelling urethral catheter [facility])  Brief Hospital Course:  Abigail Wallace is a 77 y.o.female with a history of sick sinus syndrome s/p pacemaker, A-fib, CAD, COPD, GERD, gout, CKD 3B, schizophrenia, COPD, T2DM, HTN, HFpEF, meningioma  who was admitted to the [facility] Medicine Teaching Service at [facility] for SOB and UTI. Her hospital course is detailed below:  Dyspnea Presented w/ SOB. She was placed on 2L [place] for comfort on admission, but on RA at discharge. She has been using oxygen  prn per her son at home. CXR on 09/26/23 w/ pulmonary vascular congestion.  Likely mild exacerbation of heart failure with preserved EF from Echo on 06/2023. Received 40 IV Lasix  on 9/11 and transitioned back to her home dose prior to discharge, diuresed well.   UTI associated w/ indwelling urethral catheter  Patient presented w/ abdominal and flank pain and given chronic indwelling catheter there was concern for UTI/ascending infection.  Initially started on Zosyn  in the ED for broad coverage and narrowed to Augmentin  based on cultures/sensitivities. Urine culture grew Staphylococcus species.  7-day course of antibiotics, started on Zosyn  and transitioned to Augmentin  250-125 mg BID to complete course on 9/16.  Generalized abdominal pain  L kidney  hemorrhagic cyst Patient presented in ED w/ abdominal pain. CT Renal shows stable aneurysm, large ventral abdominal wall hernia containing mid transverse colon w/o obstruction, and unchanged complex cyst of the posterior cortex of the left kidney which may reflect a hemorrhagic or proteinaceous cyst. Renal US  performed which demonstrated multiple exophytic cysts in the R kidney and the L kidney was poorly visualized.  Other chronic conditions were medically managed with home medications and formulary alternatives as necessary (T2DM, Gout, A-fib, Schizophrenia, CKD3b, HTN, HLD)  PCP Follow-up Recommendations: Aortic aneurysm follow up in 2 years F/u with fluid status F/u w/ HTN management  Discuss lowered dose of Ozempic  and consider switching back to Tradjenta  with family.   Results/Tests Pending at Time of Discharge:  Unresulted Labs (From admission, onward)    None      Disposition: Home  Discharge Condition: Stable  Discharge Exam:  Vitals:   09/28/23 0459 09/28/23 0842  BP: (!) 142/53 (!) 150/50  Pulse: 60 70  Resp: 16 17  Temp: 98.1 F (36.7 C) 97.6 F (36.4 C)  SpO2: 98% 98%   General: Awake and Alert in NAD HEENT: NCAT. Sclera anicteric. No rhinorrhea. Cardiovascular: RRR. No M/R/G Respiratory: CTAB, normal WOB on RA. No wheezing, crackles, rhonchi, or diminished breath sounds. Abdomen: Soft, non-tender, non-distended. Bowel sounds normoactive Extremities: Able to move all extremities. Skin: Warm and dry. Neuro: A&Ox3.  Significant Procedures: None  Significant Labs and Imaging:  Recent Labs  Lab 09/27/23 0356  WBC 12.4*  HGB 10.3*  HCT 31.6*  PLT 205   Recent Labs  Lab 09/27/23 0356  NA 138  K 3.8  CL 106  CO2 19*  GLUCOSE 144*  BUN 39*  CREATININE 2.66*  CALCIUM  9.0    CXR 1. Retrocardiac atelectasis or pneumonia. Possible small left pleural effusion. 2. Cardiomegaly and pulmonary vascular congestion.  CT Renal Stone Study 1. No  nephrolithiasis or obstructive uropathy. 2. Infrarenal abdominal aortic aneurysm measuring 3.8 cm. According to consensus criteria follow-up imaging in 24 months is recommended 3. Partially visualized, large ventral abdominal wall hernia containing the mid transverse colon without signs of obstruction. 4. Unchanged complex cyst of the posterior cortex of the left kidney which may reflect a hemorrhagic or proteinaceous cyst, but is incompletely characterized without iv contrast.   Renal US  1. Multiple exophytic cysts in the right kidney, with the largest measuring 2.2 x 2.0 x 2.0 cm at the lower pole. 2. Left kidney less well visualized; previously seen cysts are not discretely visualized.  Discharge Medications:  Allergies as of 09/28/2023       Reactions   Abilify [aripiprazole] Anaphylaxis, Shortness Of Breath, Other (See Comments)   Tremors   Keflex  [cephalexin ] Swelling   Tongue swelling   Versacloz  [clozapine ] Anaphylaxis   Ativan  [lorazepam ] Other (See Comments)   Unknown reaction   Benadryl [diphenhydramine Hcl] Other (See Comments)   Vision changes   Benztropine  Other (See Comments)   Mobility issues   Latuda [lurasidone Hcl] Other (See Comments)   Tremors   Remeron [mirtazapine] Other (See Comments)   Tremors   Haldol  [haloperidol  Lactate] Other (See Comments)   Tardive dyskinesias  Tremors Somnolence Speech changes Sialorrhea    Codeine Other (See Comments)   Unknown reaction   Latex Rash        Medication List     TAKE these medications    Accu-Chek Guide Test test strip Generic drug: glucose blood Use to check blood glucose once daily and as needed for symptoms   acetaminophen  500 MG tablet Commonly known as: TYLENOL  Take 500 mg by mouth every 6 (six) hours as needed for mild pain (pain score 1-3) or headache.   albuterol  108 (90 Base) MCG/ACT inhaler Commonly known as: VENTOLIN  HFA Inhale 2 puffs into the lungs every 6 (six) hours as needed for  wheezing or shortness of breath.   allopurinol  100 MG tablet Commonly known as: ZYLOPRIM  Take 0.5 tablets (50 mg total) by mouth every other day.   amLODipine  5 MG tablet Commonly known as: NORVASC  Take 1 tablet (5 mg total) by mouth at bedtime.   amoxicillin -clavulanate 250-125 MG tablet Commonly known as: AUGMENTIN  Take 1 tablet by mouth 2 (two) times daily.   apixaban  2.5 MG Tabs tablet Commonly known as: ELIQUIS  Take 1 tablet (2.5 mg total) by mouth 2 (two) times daily.   cholecalciferol  25 MCG (1000 UNIT) tablet Commonly known as: VITAMIN D3 Take 1 tablet (1,000 Units total) by mouth daily.   diclofenac  Sodium 1 % Gel Commonly known as: Voltaren  Apply 2 g topically 4 (four) times daily as needed (pain).   divalproex  250 MG DR tablet Commonly known as: DEPAKOTE  Take 1 tablet (250 mg total) by mouth every 12 (twelve) hours.   furosemide  40 MG tablet Commonly known as: Lasix  Take 1.5 tablets (60 mg total) by mouth every other day.   ipratropium-albuterol  0.5-2.5 (3) MG/3ML Soln Commonly known as: DUONEB Take 3 mLs by nebulization every 4 (four) hours as needed. What changed: when to take this   metoprolol  tartrate 100 MG tablet Commonly known as: LOPRESSOR  Take 1 tablet (100 mg total) by mouth 3 (three) times daily.   Multivitamin Women 50+  Tabs Take 1 tablet by mouth daily.   nitroGLYCERIN  0.4 MG SL tablet Commonly known as: NITROSTAT  Place 1 tablet (0.4 mg total) under the tongue every 5 (five) minutes as needed for chest pain.   nystatin  cream Commonly known as: MYCOSTATIN  Apply 1 Application topically daily as needed (Rash).   nystatin  powder Commonly known as: nystatin  Apply 1 Application topically daily. Apply after bathing.   OLANZapine  10 MG tablet Commonly known as: ZYPREXA  Take 10 mg by mouth at bedtime.   OVER THE COUNTER MEDICATION Apply 1 application  topically See admin instructions. Caldesene Medicated Protecting Powder- Apply under the  breasts and between the skin folds 2 times a day as needed for irritation   polyethylene glycol powder 17 GM/SCOOP powder Commonly known as: GLYCOLAX /MIRALAX  Take 17 g by mouth 3 (three) times daily.   pravastatin  40 MG tablet Commonly known as: PRAVACHOL  Take 1 tablet (40 mg total) by mouth every evening. What changed: when to take this   Semaglutide (0.25 or 0.5MG /DOS) 2 MG/1.5ML Sopn Inject 0.5 mg into the skin once a week. Inject 0. 5mg  weekly after completing 4 doses of 0.25 mg What changed:  when to take this additional instructions   senna 8.6 MG Tabs tablet Commonly known as: SENOKOT Take 1 tablet (8.6 mg total) by mouth daily as needed for mild constipation. What changed: when to take this   sodium bicarbonate  650 MG tablet Take 1 tablet (650 mg total) by mouth 3 (three) times daily.   Trelegy Ellipta  100-62.5-25 MCG/ACT Aepb Generic drug: Fluticasone -Umeclidin-Vilant Inhale 1 puff into the lungs daily.   triamcinolone  ointment 0.5 % Commonly known as: KENALOG  Apply 1 Application topically 2 (two) times daily. Apply daily to R flank for 2 weeks What changed:  when to take this reasons to take this additional instructions        Discharge Instructions: Please refer to Patient Instructions section of EMR for full details.  Patient was counseled important signs and symptoms that should prompt return to medical care, changes in medications, dietary instructions, activity restrictions, and follow up appointments.   Follow-Up Appointments:  Follow-up Information     Delores Suzann HERO, MD. Schedule an appointment as soon as possible for a visit.   Specialty: Family Medicine Why: ASAP for hospital follow up Contact information: [address] [place] [address] [phone]                 Janna Ferrier, DO 09/28/2023, 1:20 PM PGY-2, [facility] Family Medicine

## 2023-09-27 NOTE — Progress Notes (Signed)
 Patient and her son both refused to have patient switched over to an air mattress bed.

## 2023-09-27 NOTE — Plan of Care (Signed)

## 2023-09-27 NOTE — Progress Notes (Signed)
 OT Cancellation Note  Patient Details Name: Abigail Wallace MRN: [id number] DOB: 1945-10-28   Cancelled Treatment:    Reason Eval/Treat Not Completed: OT screened, no needs identified, will sign off.   Kennth Mliss Helling 09/27/2023, 4:14 PM Mliss HERO, OTR/L Acute Rehabilitation Services Office: [phone]

## 2023-09-27 NOTE — Assessment & Plan Note (Addendum)
 On RA this morning. CXR on 09/26/23 w/ pulmonary vascular congestion.  Likely mild exacerbation of heart failure with preserved EF from Echo on 06/2023. Received 40 IV Lasix  yesterday.  -DuoNebs every 4 hours as needed -COVId panel negative  -Continue home inhaler - Starting back on home PO Lasix  60 mg daily

## 2023-09-27 NOTE — Discharge Instructions (Addendum)
 Dear Abigail Wallace,  Thank you for letting us  participate in your care. You were hospitalized for pain and diagnosed with UTI. You were treated with pain medications and antibiotics.  CT of your abdomen showed your aneurysm continues to remain stable, will need repeat imaging in 2 years.  We also worked you up with a renal ultrasound that demonstrated some non-concerning cysts.   Please complete the Augmentin  course with the supply you have at home - twice a day for 3 days (9/14-9/16).  We are decreasing the Ozempic  dose to 0.25 mg weekly in the setting of increased nausea, this can be discussed further with PCP at follow up.  POST-HOSPITAL & CARE INSTRUCTIONS Be sure to take all of your medications as prescribed in this packet. Go to your follow up appointments (listed below)  DOCTOR'S APPOINTMENT   Future Appointments  Date Time Provider Department Center  09/30/2023  7:15 AM CVD HVT DEVICE REMOTES [facility] H&V  10/11/2023  8:30 AM Delores Suzann HERO, MD [facility]  12/30/2023  7:05 AM CVD HVT DEVICE REMOTES [facility] H&V  03/30/2024  7:05 AM CVD HVT DEVICE REMOTES [facility] H&V  06/29/2024  7:05 AM CVD HVT DEVICE REMOTES [facility] H&V  09/28/2024  7:05 AM CVD HVT DEVICE REMOTES [facility] H&V  12/28/2024  7:05 AM CVD HVT DEVICE REMOTES [facility] H&V    Follow-up Information     Delores Suzann HERO, MD. Schedule an appointment as soon as possible for a visit.   Specialty: Family Medicine Why: ASAP for hospital follow up Contact information: [address] [place] [address] [phone]                 Take care and be well!  Family Medicine Teaching Service Inpatient Team [place]  [facility]  [address] [place], [place] [address] [phone]

## 2023-09-27 NOTE — Assessment & Plan Note (Addendum)
 T2DM: Continue Weekly Ozempic .  sSSI for each meal and at bedtime + CBGs.  Pending A1c. May need to DC ozempic  on discharge given vomiting Gout: Continue home allopurinol  50 mg daily A-fib: Continue home Eliquis  2.5 mg twice daily and metoprolol  tartrate 100 mg 3 times daily Schizophrenia: Continue Depakote  250 mg twice daily and olanzapine  7.5 mg nightly CKD 3B: Continue sodium bicarb 650 mg 3 times daily HTN: Hold home amlodipine  5 mg nightly given normotension HLD: Continue home pravastatin  40 mg daily

## 2023-09-28 DIAGNOSIS — T83511A Infection and inflammatory reaction due to indwelling urethral catheter, initial encounter: Secondary | ICD-10-CM | POA: Diagnosis not present

## 2023-09-28 DIAGNOSIS — N390 Urinary tract infection, site not specified: Secondary | ICD-10-CM | POA: Diagnosis not present

## 2023-09-28 LAB — CULTURE, BLOOD (ROUTINE X 2)

## 2023-09-28 LAB — GLUCOSE, CAPILLARY: Glucose-Capillary: 114 mg/dL — ABNORMAL HIGH (ref 70–99)

## 2023-09-28 MED ORDER — FUROSEMIDE 40 MG PO TABS: 60.0000 mg | ORAL_TABLET | ORAL | Status: DC

## 2023-09-28 NOTE — Plan of Care (Signed)

## 2023-09-28 NOTE — Assessment & Plan Note (Signed)
 Repeat in 2027 Fall

## 2023-09-28 NOTE — TOC CM/SW Note (Signed)
 Transition of Care [facility]) - Discharge Note   Patient Details  Name: Abigail Wallace MRN: [id number] Date of Birth: September 10, 1945  Transition of [facility]) CM/SW Contact:  Ermalinda Penton [place], [place] Phone Number: 09/28/2023, 11:48 AM   Clinical Narrative:  11:48am: Patient to discharge today. [facility] contacted for transportation home. Son would like to be contacted when transportation arrives. Patient is #4 on the list at this time.  Jamielynn Wigley, LCSW Transition of Care    Final next level of care: Home/Self Care Barriers to Discharge: Continued Medical Work up   Patient Goals and CMS Choice Patient states their goals for this hospitalization and ongoing recovery are:: to return to home   Choice offered to / list presented to : NA      Discharge Placement                       Discharge Plan and Services Additional resources added to the After Visit Summary for     Discharge Planning Services: CM Consult Post Acute Care Choice: NA          DME Arranged: N/A DME Agency: NA       HH Arranged: NA HH Agency: NA        Social Drivers of Health (SDOH) Interventions SDOH Screenings   Food Insecurity: No Food Insecurity (09/26/2023)  Housing: Low Risk  (09/26/2023)  Transportation Needs: No Transportation Needs (09/26/2023)  Utilities: Not At Risk (09/26/2023)  Alcohol  Screen: Low Risk  (12/27/2020)  Depression (PHQ2-9): High Risk (02/22/2022)  Financial Resource Strain: Low Risk  (03/23/2022)  Physical Activity: Inactive (12/27/2020)  Social Connections: Socially Isolated (09/26/2023)  Stress: No Stress Concern Present (12/27/2020)  Tobacco Use: Medium Risk (09/26/2023)     Readmission Risk Interventions    06/19/2023    9:04 AM  Readmission Risk Prevention Plan  Transportation Screening Complete  PCP or Specialist Appt within 5-7 Days Complete  Home Care Screening Complete  Medication Review (RN CM) Referral to Pharmacy

## 2023-09-28 NOTE — Progress Notes (Signed)
 PT discharged Home per MD order. Discussed prescriptions and follow up appointments with the patient. Patient verbalized understanding.   Skin clean, dry and intact without evidence of skin break down, no evidence of skin tears noted. IV catheter discontinued intact. Site without signs and symptoms of complications. Pt denies pain.No complaints noted.  An After Visit Summary (AVS) was printed and given to the patient.  Pt transported to home via PTAR/Ambulance, Notified the Son Fairy [phone]).

## 2023-09-29 ENCOUNTER — Telehealth: Payer: Self-pay | Admitting: Family Medicine

## 2023-09-29 ENCOUNTER — Ambulatory Visit: Payer: Self-pay | Admitting: Family Medicine

## 2023-09-29 LAB — URINE CULTURE: Culture: >=100000 — AB

## 2023-09-29 NOTE — Telephone Encounter (Signed)
 Attempted to call son to ask about labs this week.  RN team- can you please [name] and ask for BMP week of 9/25  Thank you! Suzann Daring, MD  Family Medicine Teaching Service

## 2023-09-30 ENCOUNTER — Ambulatory Visit (INDEPENDENT_AMBULATORY_CARE_PROVIDER_SITE_OTHER): Payer: Self-pay

## 2023-09-30 ENCOUNTER — Telehealth: Payer: Self-pay | Admitting: *Deleted

## 2023-09-30 DIAGNOSIS — I442 Atrioventricular block, complete: Secondary | ICD-10-CM

## 2023-09-30 NOTE — Telephone Encounter (Signed)
 Please have them draw BMP on 10/01/2023  Thank you!!!

## 2023-09-30 NOTE — Transitions of Care (Post Inpatient/ED Visit) (Signed)
 09/30/2023  Name: Abigail Wallace [facility] MRN: [id number] DOB: 27-Jan-1945  Today's TOC FU Call Status: Today's TOC FU Call Status:: Successful TOC FU Call Completed TOC FU Call Complete Date: 09/30/23 Patient's Name and Date of Birth confirmed.  Transition Care Management Follow-up Telephone Call Date of Discharge: 09/28/23 Discharge Facility: [name]) Type of Discharge: Inpatient Admission Primary Inpatient Discharge Diagnosis:: Urinary tract infection associated with indwelling urethral catheter How have you been since you were released from the hospital?: Better Any questions or concerns?: No  Items Reviewed: Did you receive and understand the discharge instructions provided?: Yes Medications obtained,verified, and reconciled?: Yes (Medications Reviewed) Any new allergies since your discharge?: No Dietary orders reviewed?: No Do you have support at home?: Yes People in Home [RPT]: child(ren), adult Name of Support/Comfort Primary Source: ronald  Medications Reviewed Today: Medications Reviewed Today     Reviewed by Kennieth Cathlean DEL, RN (Case Manager) on 09/30/23 at 1121  Med List Status: <None>   Medication Order Taking? Sig Documenting Provider Last Dose Status Informant  acetaminophen  (TYLENOL ) 500 MG tablet [id number] Yes Take 500 mg by mouth every 6 (six) hours as needed for mild pain (pain score 1-3) or headache. [provider]  Active Child, Pharmacy Records  albuterol  (VENTOLIN  HFA) 108 [phone] Base) MCG/ACT inhaler [id number] Yes Inhale 2 puffs into the lungs every 6 (six) hours as needed for wheezing or shortness of breath. Delores Suzann HERO, MD  Active Child, Pharmacy Records  allopurinol  (ZYLOPRIM ) 100 MG tablet [id number] Yes Take 0.5 tablets (50 mg total) by mouth every other day. Delores Suzann HERO, MD  Active Child, Pharmacy Records  amLODipine  (NORVASC ) 5 MG tablet [id number] Yes Take 1 tablet (5 mg total) by mouth at bedtime. Delores Suzann HERO, MD  Active  Child, Pharmacy Records  amoxicillin -clavulanate (AUGMENTIN ) 250-125 MG tablet [id number]  Take 1 tablet by mouth 2 (two) times daily.  Patient not taking: Reported on 09/30/2023   Delores Suzann HERO, MD  Active Child, Pharmacy Records           Med Note [facility], [facility]   Sat Sep 28, 2023  9:06 AM) Per dispense report, LF 09/25/23 #10, 5 DS.  apixaban  (ELIQUIS ) 2.5 MG TABS tablet [id number] Yes Take 1 tablet (2.5 mg total) by mouth 2 (two) times daily. Delores Suzann HERO, MD  Active Child, Pharmacy Records  cholecalciferol  (VITAMIN D3) 25 MCG (1000 UNIT) tablet [id number] Yes Take 1 tablet (1,000 Units total) by mouth daily. Rumball, Alison M, DO  Active Child, Pharmacy Records  diclofenac  Sodium (VOLTAREN ) 1 % GEL [id number] Yes Apply 2 g topically 4 (four) times daily as needed (pain). Delores Suzann HERO, MD  Active Child, Pharmacy Records  divalproex  (DEPAKOTE ) 250 MG DR tablet [id number]  Take 1 tablet (250 mg total) by mouth every 12 (twelve) hours. Samtani, Jai-Gurmukh, MD  Active Child, Pharmacy Records  Fluticasone -Umeclidin-Vilant (TRELEGY ELLIPTA ) 100-62.5-25 MCG/ACT AEPB [id number] Yes Inhale 1 puff into the lungs daily. Delores Suzann HERO, MD  Active Child, Pharmacy Records  furosemide  (LASIX ) 40 MG tablet [id number] Yes Take 1.5 tablets (60 mg total) by mouth every other day. Janna Ferrier, DO  Active   glucose blood (ACCU-CHEK GUIDE TEST) test strip [id number] Yes Use to check blood glucose once daily and as needed for symptoms Delores Suzann HERO, MD  Active Child, Pharmacy Records  ipratropium-albuterol  (DUONEB) 0.5-2.5 (3) MG/3ML SOLN [id number] Yes Take 3 mLs by nebulization every 4 (four) hours as needed.  Patient taking differently: Take  3 mLs by nebulization 2 (two) times daily.   Delores Suzann HERO, MD  Active Child, Pharmacy Records  metoprolol  tartrate (LOPRESSOR ) 100 MG tablet [id number] Yes Take 1 tablet (100 mg total) by mouth 3 (three) times daily. Delores Suzann HERO, MD  Active Child, Pharmacy Records   Multiple Vitamins-Minerals (MULTIVITAMIN WOMEN 50+) TABS [id number] Yes Take 1 tablet by mouth daily. [provider]  Active Child, Pharmacy Records  nitroGLYCERIN  (NITROSTAT ) 0.4 MG SL tablet [id number] Yes Place 1 tablet (0.4 mg total) under the tongue every 5 (five) minutes as needed for chest pain. Croitoru, Jerel, MD  Active Child, Pharmacy Records  nystatin  cream (MYCOSTATIN ) [id number] Yes Apply 1 Application topically daily as needed (Rash). [provider]  Active Child, Pharmacy Records  nystatin  powder [id number] Yes Apply 1 Application topically daily. Apply after bathing. Delores Suzann HERO, MD  Active Child, Pharmacy Records  OLANZapine  (ZYPREXA ) 10 MG tablet [id number] Yes Take 10 mg by mouth at bedtime. [provider]  Active Child, Pharmacy Records  OVER THE COUNTER MEDICATION [id number] Yes Apply 1 application  topically See admin instructions. Caldesene Medicated Protecting Powder- Apply under the breasts and between the skin folds 2 times a day as needed for irritation [provider]  Active Child, Pharmacy Records  polyethylene glycol powder (GLYCOLAX /MIRALAX ) 17 GM/SCOOP powder [id number] Yes Take 17 g by mouth 3 (three) times daily. Delores Suzann HERO, MD  Active Child, Pharmacy Records  pravastatin  (PRAVACHOL ) 40 MG tablet [id number] Yes Take 1 tablet (40 mg total) by mouth every evening.  Patient taking differently: Take 40 mg by mouth at bedtime.   Croitoru, Mihai, MD  Active Child, Pharmacy Records  Semaglutide ,0.25 or 0.5MG /DOS, 2 MG/1.5ML SOPN [id number] Yes Inject 0.5 mg into the skin once a week. Inject 0. 5mg  weekly after completing 4 doses of 0.25 mg  Patient taking differently: Inject 0.5 mg into the skin every Tuesday.   Delores Suzann HERO, MD  Active Child, Pharmacy Records  senna [facility]) 8.6 MG TABS tablet [id number] Yes Take 1 tablet (8.6 mg total) by mouth daily as needed for mild constipation.  Patient taking differently: Take 1 tablet by  mouth daily.   Delores Suzann HERO, MD  Active Child, Pharmacy Records  sodium bicarbonate  650 MG tablet [id number] Yes Take 1 tablet (650 mg total) by mouth 3 (three) times daily. Delores Suzann HERO, MD  Active Child, Pharmacy Records  triamcinolone  ointment (KENALOG ) 0.5 % [id number] Yes Apply 1 Application topically 2 (two) times daily. Apply daily to R flank for 2 weeks  Patient taking differently: Apply 1 Application topically 2 (two) times daily as needed (skin irritation).   Delores Suzann HERO, MD  Active Child, Pharmacy Records  Med List Note Allegra Deatrice FERNS, CPhT 03/15/21 1616): [name] Arenas (Son) at [phone] [facility]) helps with medications.            Home Care and Equipment/Supplies: Were Home Health Services Ordered?: NA Any new equipment or medical supplies ordered?: NA  Functional Questionnaire: Do you need assistance with bathing/showering or dressing?: Yes Do you need assistance with meal preparation?: Yes Do you need assistance with eating?: No Do you have difficulty maintaining continence: Yes Do you need assistance with getting out of bed/getting out of a chair/moving?: Yes Do you have difficulty managing or taking your medications?: Yes  Follow up appointments reviewed: PCP Follow-up appointment confirmed?: Yes Date of PCP follow-up appointment?: 10/11/23 Follow-up Provider: Suzann Delores Specialist [facility] Follow-up appointment confirmed?: NA Do you need transportation to  your follow-up appointment?: No Do you understand care options if your condition(s) worsen?: Yes-patient verbalized understanding  SDOH Interventions Today    Flowsheet Row Most Recent Value  SDOH Interventions   Food Insecurity Interventions Intervention Not Indicated  Housing Interventions Intervention Not Indicated  Transportation Interventions Intervention Not Indicated, Patient Resources (Friends/Family)  Utilities Interventions Intervention Not Indicated  Depression  Interventions/Treatment  Medication   Discussed and offered 30 day TOC program.  Patient  declined.  The patient has been provided with contact information for the care management team and has been advised to call with any health -related questions or concerns.  The patient verbalized understanding with current plan of care.  The patient is directed to their insurance card regarding availability of benefits coverage  Cathlean Headland BSN RN [facility] Care Management Coordinator [email or web address] Direct Dial: [phone]  Fax: [phone] Website: [email or web address]

## 2023-09-30 NOTE — Telephone Encounter (Signed)
 Called and spoke with [name] at [facility].   Patient has visits scheduled for tomorrow and then again on 9/30.   Would either of these dates work for repeat labs or would you like for an additional visit to be added on? (Unsure of insurance coverage for additional visit)  [name], RN

## 2023-09-30 NOTE — Telephone Encounter (Signed)
 Called Abigail Wallace and provided with verbal orders for BMP on 10/01/23.  Abigail JAYSON English, RN

## 2023-10-01 LAB — CULTURE, BLOOD (ROUTINE X 2)
Culture: NO GROWTH
Special Requests: ADEQUATE

## 2023-10-03 ENCOUNTER — Telehealth: Payer: Self-pay

## 2023-10-03 ENCOUNTER — Ambulatory Visit: Payer: Self-pay | Admitting: Cardiovascular Disease

## 2023-10-03 DIAGNOSIS — I2699 Other pulmonary embolism without acute cor pulmonale: Secondary | ICD-10-CM | POA: Diagnosis not present

## 2023-10-03 DIAGNOSIS — E1122 Type 2 diabetes mellitus with diabetic chronic kidney disease: Secondary | ICD-10-CM | POA: Diagnosis not present

## 2023-10-03 DIAGNOSIS — I130 Hypertensive heart and chronic kidney disease with heart failure and stage 1 through stage 4 chronic kidney disease, or unspecified chronic kidney disease: Secondary | ICD-10-CM | POA: Diagnosis not present

## 2023-10-03 DIAGNOSIS — I5043 Acute on chronic combined systolic (congestive) and diastolic (congestive) heart failure: Secondary | ICD-10-CM | POA: Diagnosis not present

## 2023-10-03 DIAGNOSIS — N1832 Chronic kidney disease, stage 3b: Secondary | ICD-10-CM | POA: Diagnosis not present

## 2023-10-03 DIAGNOSIS — Z466 Encounter for fitting and adjustment of urinary device: Secondary | ICD-10-CM | POA: Diagnosis not present

## 2023-10-03 LAB — CUP PACEART REMOTE DEVICE CHECK
Battery Impedance: 1989 Ohm
Battery Remaining Longevity: 39 mo
Battery Voltage: 2.75 V
Brady Statistic RV Percent Paced: 100 %
Date Time Interrogation Session: 20250917184058
Implantable Lead Connection Status: 753985
Implantable Lead Connection Status: 753985
Implantable Lead Implant Date: 20070414
Implantable Lead Implant Date: 20070914
Implantable Lead Location: 753859
Implantable Lead Location: 753860
Implantable Lead Model: 4092
Implantable Lead Model: 5594
Implantable Pulse Generator Implant Date: 20160816
Lead Channel Impedance Value: 636 Ohm
Lead Channel Impedance Value: 67 Ohm
Lead Channel Pacing Threshold Amplitude: 1.125 V
Lead Channel Pacing Threshold Pulse Width: 0.4 ms
Lead Channel Setting Pacing Amplitude: 2.5 V
Lead Channel Setting Pacing Pulse Width: 0.4 ms
Lead Channel Setting Sensing Sensitivity: 4 mV
Zone Setting Status: 755011
Zone Setting Status: 755011

## 2023-10-03 NOTE — Telephone Encounter (Signed)
 Received call from Deandra, RN regarding today's home visit.   Deandra reports that she went to home for resumption of care visit today.   Advised that she attempted to draw labs, however, was unsuccessful.   [name] will attempt to obtain labs again tomorrow.   [name], RN

## 2023-10-04 ENCOUNTER — Other Ambulatory Visit: Payer: Self-pay

## 2023-10-04 MED ORDER — NYSTATIN 100000 UNIT/GM EX POWD: 1.0000 | Freq: Every day | CUTANEOUS | 2 refills | Status: AC

## 2023-10-04 MED ORDER — TRIAMCINOLONE ACETONIDE 0.5 % EX OINT: 1.0000 | TOPICAL_OINTMENT | Freq: Two times a day (BID) | CUTANEOUS | 1 refills | Status: AC | PRN

## 2023-10-05 DIAGNOSIS — M179 Osteoarthritis of knee, unspecified: Secondary | ICD-10-CM | POA: Diagnosis not present

## 2023-10-05 DIAGNOSIS — M6281 Muscle weakness (generalized): Secondary | ICD-10-CM | POA: Diagnosis not present

## 2023-10-05 DIAGNOSIS — I951 Orthostatic hypotension: Secondary | ICD-10-CM | POA: Diagnosis not present

## 2023-10-05 DIAGNOSIS — J449 Chronic obstructive pulmonary disease, unspecified: Secondary | ICD-10-CM | POA: Diagnosis not present

## 2023-10-06 DIAGNOSIS — M179 Osteoarthritis of knee, unspecified: Secondary | ICD-10-CM | POA: Diagnosis not present

## 2023-10-06 DIAGNOSIS — J449 Chronic obstructive pulmonary disease, unspecified: Secondary | ICD-10-CM | POA: Diagnosis not present

## 2023-10-06 DIAGNOSIS — I951 Orthostatic hypotension: Secondary | ICD-10-CM | POA: Diagnosis not present

## 2023-10-07 NOTE — Telephone Encounter (Signed)
 Spoke with Abigail Wallace.   He reports he believes the nebulizer is less than one year old.   He reports he would rather attempt to resend to [employer] for coverage.   He reports if they will not replace he will reach out to the company.   Community message sent to [employer].

## 2023-10-07 NOTE — Telephone Encounter (Signed)
 New nebulizer ordered.  Suzann Daring, MD  Family Medicine Teaching Service

## 2023-10-07 NOTE — Telephone Encounter (Signed)
 Delaying until 9/29 is fine.   Thank you!

## 2023-10-07 NOTE — Telephone Encounter (Signed)
 Abigail Wallace calls nurse line in regards to Abigail Wallace home visit.   He reports no one came back out to the home on Friday to redraw labs.   He reports he spoke with Abigail Wallace this morning, however they stated we are not sure when we will be able to come back out.   I called [name] and spoke with Abigail Wallace.   She reports the patient prefers Abigail Wallace over any other nurse at [facility]. She reports last week Abigail Wallace reported the patient told her to never come back.  She reports Abigail Wallace is on vacation until 9/29.  She reports if PCP is ok with delaying labs until the week of 9/29, she will be able to send Abigail Wallace.   Advised will forward to PCP.

## 2023-10-07 NOTE — Telephone Encounter (Signed)
 VO given for labs week of 9/29 to Teon.  Called [name] and gave update.

## 2023-10-07 NOTE — Progress Notes (Signed)
 Remote PPM Transmission

## 2023-10-10 ENCOUNTER — Encounter: Payer: Self-pay | Admitting: Family Medicine

## 2023-10-10 NOTE — Progress Notes (Unsigned)
    SUBJECTIVE:   CHIEF COMPLAINT: follow up from hospital stay  HPI:   Abigail Wallace is a 78 y.o.  with history notable for COPD, type 2 diabetes, CKD IIIb presenting for home visit. The patient is bedbound due to weakness from back pain and post polio syndrome. As such, she is seen at home.   Hospital follow up Recently admitted for urinary tract infection related to a chronic indwelling Foley catheter.  This was treated appropriately and she is improved.  She is concerned that she has some mild suprapubic pain and odor to her urine.   No fevers and her flank pain is improved.  During her hospital stay she was also treated for heart failure exacerbation her breathing is improved her oxygen  levels have been appropriate and she has not required any home oxygen .  Diabetes Morning sugars have been slightly elevated.  Range from 130-167 at the highest.  No values above 180.  No hypoglycemia.  She has been doing Ozempic  0.25 mg weekly and tolerating this well.  No further vomiting.  HTN Patient is doing overall well with her blood pressures.  Systolics have been in 140s.  Her goal is certainly less than 130/80 or even lower.  Recommended increasing amlodipine  to 10 mg.  Discussed difference between this and allopurinol .  Continue allopurinol  50 mg every other day for gout prevention.   PERTINENT  PMH / PSH/Family/Social History : Updated and reviweed   OBJECTIVE:   BP (!) 142/78   Pulse 62   SpO2 97%   Today's weight:  Review of prior weights: There were no vitals filed for this visit.  Pleasant, talkative, in bed RRR Lungs clear bilaterally to auscultation   ASSESSMENT/PLAN:   Assessment & Plan Well controlled type 2 diabetes mellitus (HCC) Last A1C 6.5 Next check January 2026 Continue Ozempic , BG appropriate Could consider increase but has had nausea with higher dose  Mucopurulent chronic bronchitis (HCC) Doing well, has new nebulizer  CKD stage IIIb Repeat CMP  and CBC with [name], had slight AKI during hospital stay  Abdominal aortic ectasia Reviewed imaging, follow up fall 2027  Essential hypertension Above goal Increase amlodipine  to 10 mg daily  Renal cyst Had follow up ultrasound--likely proteinaceous cyst. Has had multiple imaging studies recommended no further follow up  Urinary tract infection associated with indwelling urethral catheter, subsequent encounter Given skin breakdown, will request Foley removal and replacement Urine culture on 9/29    Plan for home visit in December  Suzann Daring, MD  Family Medicine Teaching Service  [facility]

## 2023-10-11 ENCOUNTER — Encounter: Payer: Self-pay | Admitting: Family Medicine

## 2023-10-11 ENCOUNTER — Other Ambulatory Visit: Admitting: Family Medicine

## 2023-10-11 ENCOUNTER — Telehealth: Payer: Self-pay | Admitting: Family Medicine

## 2023-10-11 VITALS — BP 142/78 | HR 62

## 2023-10-11 DIAGNOSIS — I10 Essential (primary) hypertension: Secondary | ICD-10-CM | POA: Diagnosis not present

## 2023-10-11 DIAGNOSIS — I77811 Abdominal aortic ectasia: Secondary | ICD-10-CM

## 2023-10-11 DIAGNOSIS — N281 Cyst of kidney, acquired: Secondary | ICD-10-CM | POA: Diagnosis not present

## 2023-10-11 DIAGNOSIS — N390 Urinary tract infection, site not specified: Secondary | ICD-10-CM | POA: Diagnosis not present

## 2023-10-11 DIAGNOSIS — J411 Mucopurulent chronic bronchitis: Secondary | ICD-10-CM | POA: Diagnosis not present

## 2023-10-11 DIAGNOSIS — T83511D Infection and inflammatory reaction due to indwelling urethral catheter, subsequent encounter: Secondary | ICD-10-CM | POA: Diagnosis not present

## 2023-10-11 DIAGNOSIS — N1832 Chronic kidney disease, stage 3b: Secondary | ICD-10-CM

## 2023-10-11 DIAGNOSIS — E119 Type 2 diabetes mellitus without complications: Secondary | ICD-10-CM

## 2023-10-11 MED ORDER — SEMAGLUTIDE(0.25 OR 0.5MG/DOS) 2 MG/1.5ML ~~LOC~~ SOPN: 0.2500 mg | PEN_INJECTOR | SUBCUTANEOUS | 3 refills | Status: DC

## 2023-10-11 MED ORDER — AMLODIPINE BESYLATE 10 MG PO TABS: 10.0000 mg | ORAL_TABLET | Freq: Every day | ORAL | 3 refills | Status: AC

## 2023-10-11 NOTE — Assessment & Plan Note (Addendum)
 Had follow up ultrasound--likely proteinaceous cyst. Has had multiple imaging studies recommended no further follow up

## 2023-10-11 NOTE — Telephone Encounter (Signed)
 Called [name] and provided with verbal orders per Dr. Delores.   Samule, RN is scheduled for visit this coming Monday, 10/14/23.  [name], RN

## 2023-10-11 NOTE — Assessment & Plan Note (Addendum)
 Reviewed imaging, follow up fall 2027

## 2023-10-11 NOTE — Assessment & Plan Note (Addendum)
 Repeat CMP and CBC with [name], had slight AKI during hospital stay

## 2023-10-11 NOTE — Assessment & Plan Note (Addendum)
 Given skin breakdown, will request Foley removal and replacement Urine culture on 9/29

## 2023-10-11 NOTE — Assessment & Plan Note (Addendum)
 Last A1C 6.5 Next check January 2026 Continue Ozempic , BG appropriate Could consider increase but has had nausea with higher dose

## 2023-10-11 NOTE — Addendum Note (Signed)
 Addended by: DELORES, Castor Gittleman on: 10/11/2023 09:10 AM   Modules accepted: Orders

## 2023-10-11 NOTE — Telephone Encounter (Signed)
 RN team please call [name] about upcoming blood draw/RN visit and ask for  - Foley exchange (having skin breakdown with one from hospital) - Urine culture - CMP - CBC - Urine albumin to creatinine ratio  Thank you Suzann Daring, MD  [facility] Medicine Teaching Service

## 2023-10-11 NOTE — Assessment & Plan Note (Addendum)
 Above goal Increase amlodipine  to 10 mg daily

## 2023-10-11 NOTE — Assessment & Plan Note (Signed)
 Doing well, has new nebulizer

## 2023-10-14 DIAGNOSIS — I130 Hypertensive heart and chronic kidney disease with heart failure and stage 1 through stage 4 chronic kidney disease, or unspecified chronic kidney disease: Secondary | ICD-10-CM | POA: Diagnosis not present

## 2023-10-14 DIAGNOSIS — Z466 Encounter for fitting and adjustment of urinary device: Secondary | ICD-10-CM | POA: Diagnosis not present

## 2023-10-14 DIAGNOSIS — N1832 Chronic kidney disease, stage 3b: Secondary | ICD-10-CM | POA: Diagnosis not present

## 2023-10-14 DIAGNOSIS — E1122 Type 2 diabetes mellitus with diabetic chronic kidney disease: Secondary | ICD-10-CM | POA: Diagnosis not present

## 2023-10-14 DIAGNOSIS — I2699 Other pulmonary embolism without acute cor pulmonale: Secondary | ICD-10-CM | POA: Diagnosis not present

## 2023-10-14 DIAGNOSIS — I5043 Acute on chronic combined systolic (congestive) and diastolic (congestive) heart failure: Secondary | ICD-10-CM | POA: Diagnosis not present

## 2023-10-14 NOTE — Telephone Encounter (Signed)
 Agree with supportive measures recommended by Chiquita English, RN

## 2023-10-14 NOTE — Telephone Encounter (Signed)
 Patient called RN line this morning with concerns regarding home health orders.   Advised that I called and provided verbal orders on Friday. Patient believes that orders were not given and is asking that I call and confirm orders.   Called [name]. I was advised that there was no issues with orders. Samule is at visit now.   Patient also reports sore/raw throat.   She states that this has been present since visit with Dr. Delores on Friday. She has not been able to check her temperature, however does feel chilled at times.   Denies body aches, cough, congestion.   She is asking if Dr. Delores can send her in any medication for the throat pain. Advised that Dr. Delores is out of the office this week.   Advised of supportive measures (cough/throat drops, throat spray, warm beverages and broths) for symptom management.   Will forward to provider for any additional recommendations.   *Patient has great difficulty coming into the office for appointments.   [name], RN

## 2023-10-15 LAB — LAB REPORT - SCANNED: EGFR: 17

## 2023-10-16 ENCOUNTER — Encounter: Payer: Self-pay | Admitting: Family Medicine

## 2023-10-16 LAB — LAB REPORT - SCANNED
Albumin, Urine POC: 617.2
Creatinine, POC: 37.9 mg/dL
Microalb Creat Ratio: 1628

## 2023-10-17 DIAGNOSIS — J441 Chronic obstructive pulmonary disease with (acute) exacerbation: Secondary | ICD-10-CM | POA: Diagnosis not present

## 2023-10-17 DIAGNOSIS — D329 Benign neoplasm of meninges, unspecified: Secondary | ICD-10-CM | POA: Diagnosis not present

## 2023-10-17 NOTE — Telephone Encounter (Signed)
 Called [name]. He reports concerns for UTI due to urine smell being more pungent and patient complaining of more itching and burning. He also reports that she is much more sensitive to be touched on her right side during turns/changes.   He also reports low grade fever between 99.2 and 99.3.  Discussed concerns with sore throat. [name] states that he performed COVID test on Sunday which was negative. States that she will still complain of sore throat, however, not as much as before.   Called to [facility] to request official copy of lab results.   Will scan into chart once available.   [name], RN

## 2023-10-18 ENCOUNTER — Telehealth: Payer: Self-pay

## 2023-10-18 NOTE — Telephone Encounter (Signed)
 Deandra from [facility] calling for nursing verbal orders as follows:  Once every other week for 8 weeks and every 4 weeks for foley catheter changes.   Verbal orders given per [facility] protocol  [name], RN

## 2023-10-18 NOTE — Telephone Encounter (Signed)
 After speaking with [name] yesterday afternoon, called [facility] to request results. Received CBC and CMP and placed in provider box for review.   There was no UA or culture results.   Called back this morning and requested these results again. Results received, however, culture still pending. UA notable for cloudy appearance, WBC 2+, Protein 3+, glucose 2+, occult blood 3+, bacteria and yeast.   I have also placed these in PCP box.   [name], RN

## 2023-10-18 NOTE — Telephone Encounter (Signed)
 Received another fax from [facility] with culture results.   See below.   Greater than 2 organisms recovered, none predominant. Please submit another sample if clinically indicated. Greater than 100,000 colony forming units per mL.   Will forward to covering provider, Donah.  *This sample was obtained after new foley change.   [name], RN

## 2023-10-19 MED ORDER — AMOXICILLIN 250 MG PO CAPS: 250.0000 mg | ORAL_CAPSULE | Freq: Two times a day (BID) | ORAL | 0 refills | Status: DC

## 2023-10-24 ENCOUNTER — Telehealth: Payer: Self-pay

## 2023-10-24 NOTE — Telephone Encounter (Signed)
 Patient calls nurse line reporting abdominal pain and reflux.  She reports she burps a lot and has a lot of burning post meals the last few days. She reports she has been eating a lot of roast beef.  She denies any nausea or vomiting. She reports she did have (3) loose stools today. She denies any blood.   She reports she has toms to take at home.  She reports she noticed blood in her urine while she was in the bath the other night. She reports non since. She denies any fevers, chills or body aches.   Advised will forward to PCP.

## 2023-10-27 NOTE — Telephone Encounter (Signed)
 Called son. Patient doing well. He will call back if further concerns.   Suzann Daring, MD  Family Medicine Teaching Service

## 2023-10-28 ENCOUNTER — Emergency Department (HOSPITAL_COMMUNITY)

## 2023-10-28 ENCOUNTER — Emergency Department (HOSPITAL_COMMUNITY)
Admission: EM | Admit: 2023-10-28 | Discharge: 2023-10-29 | Disposition: A | Discharge status: Home / Self Care | Attending: Emergency Medicine | Admitting: Emergency Medicine

## 2023-10-28 ENCOUNTER — Ambulatory Visit

## 2023-10-28 DIAGNOSIS — I771 Stricture of artery: Secondary | ICD-10-CM | POA: Diagnosis not present

## 2023-10-28 DIAGNOSIS — R112 Nausea with vomiting, unspecified: Secondary | ICD-10-CM

## 2023-10-28 DIAGNOSIS — Z91040 Latex allergy status: Secondary | ICD-10-CM | POA: Diagnosis not present

## 2023-10-28 DIAGNOSIS — I2510 Atherosclerotic heart disease of native coronary artery without angina pectoris: Secondary | ICD-10-CM | POA: Insufficient documentation

## 2023-10-28 DIAGNOSIS — I4891 Unspecified atrial fibrillation: Secondary | ICD-10-CM | POA: Insufficient documentation

## 2023-10-28 DIAGNOSIS — Z7901 Long term (current) use of anticoagulants: Secondary | ICD-10-CM | POA: Diagnosis not present

## 2023-10-28 DIAGNOSIS — K429 Umbilical hernia without obstruction or gangrene: Secondary | ICD-10-CM | POA: Diagnosis not present

## 2023-10-28 DIAGNOSIS — R109 Unspecified abdominal pain: Secondary | ICD-10-CM | POA: Diagnosis present

## 2023-10-28 DIAGNOSIS — J449 Chronic obstructive pulmonary disease, unspecified: Secondary | ICD-10-CM | POA: Insufficient documentation

## 2023-10-28 DIAGNOSIS — N3000 Acute cystitis without hematuria: Secondary | ICD-10-CM | POA: Diagnosis not present

## 2023-10-28 DIAGNOSIS — E119 Type 2 diabetes mellitus without complications: Secondary | ICD-10-CM | POA: Insufficient documentation

## 2023-10-28 DIAGNOSIS — I509 Heart failure, unspecified: Secondary | ICD-10-CM | POA: Diagnosis not present

## 2023-10-28 DIAGNOSIS — R531 Weakness: Secondary | ICD-10-CM

## 2023-10-28 DIAGNOSIS — R059 Cough, unspecified: Secondary | ICD-10-CM | POA: Insufficient documentation

## 2023-10-28 DIAGNOSIS — R0602 Shortness of breath: Secondary | ICD-10-CM | POA: Diagnosis not present

## 2023-10-28 DIAGNOSIS — N390 Urinary tract infection, site not specified: Secondary | ICD-10-CM | POA: Insufficient documentation

## 2023-10-28 DIAGNOSIS — Y732 Prosthetic and other implants, materials and accessory gastroenterology and urology devices associated with adverse incidents: Secondary | ICD-10-CM | POA: Diagnosis not present

## 2023-10-28 DIAGNOSIS — T83511A Infection and inflammatory reaction due to indwelling urethral catheter, initial encounter: Secondary | ICD-10-CM | POA: Insufficient documentation

## 2023-10-28 DIAGNOSIS — D72829 Elevated white blood cell count, unspecified: Secondary | ICD-10-CM | POA: Diagnosis not present

## 2023-10-28 DIAGNOSIS — R6889 Other general symptoms and signs: Secondary | ICD-10-CM | POA: Diagnosis not present

## 2023-10-28 DIAGNOSIS — K5730 Diverticulosis of large intestine without perforation or abscess without bleeding: Secondary | ICD-10-CM | POA: Diagnosis not present

## 2023-10-28 DIAGNOSIS — I110 Hypertensive heart disease with heart failure: Secondary | ICD-10-CM | POA: Diagnosis not present

## 2023-10-28 DIAGNOSIS — R939 Diagnostic imaging inconclusive due to excess body fat of patient: Secondary | ICD-10-CM | POA: Diagnosis not present

## 2023-10-28 DIAGNOSIS — I517 Cardiomegaly: Secondary | ICD-10-CM | POA: Diagnosis not present

## 2023-10-28 DIAGNOSIS — J439 Emphysema, unspecified: Secondary | ICD-10-CM | POA: Diagnosis not present

## 2023-10-28 DIAGNOSIS — R052 Subacute cough: Secondary | ICD-10-CM

## 2023-10-28 DIAGNOSIS — I7143 Infrarenal abdominal aortic aneurysm, without rupture: Secondary | ICD-10-CM | POA: Diagnosis not present

## 2023-10-28 LAB — I-STAT VENOUS BLOOD GAS, ED
Acid-base deficit: 6 mmol/L — ABNORMAL HIGH (ref 0.0–2.0)
Bicarbonate: 17.8 mmol/L — ABNORMAL LOW (ref 20.0–28.0)
Calcium, Ion: 1.2 mmol/L (ref 1.15–1.40)
HCT: 32 % — ABNORMAL LOW (ref 36.0–46.0)
Hemoglobin: 10.9 g/dL — ABNORMAL LOW (ref 12.0–15.0)
O2 Saturation: 81 %
Potassium: 4.1 mmol/L (ref 3.5–5.1)
Sodium: 134 mmol/L — ABNORMAL LOW (ref 135–145)
TCO2: 19 mmol/L — ABNORMAL LOW (ref 22–32)
pCO2, Ven: 30.4 mmHg — ABNORMAL LOW (ref 44–60)
pH, Ven: 7.377 (ref 7.25–7.43)
pO2, Ven: 46 mmHg — ABNORMAL HIGH (ref 32–45)

## 2023-10-28 LAB — CBC WITH DIFFERENTIAL/PLATELET
Abs Immature Granulocytes: 0.16 K/uL — ABNORMAL HIGH (ref 0.00–0.07)
Basophils Absolute: 0.1 K/uL (ref 0.0–0.1)
Basophils Relative: 0 %
Eosinophils Absolute: 0.2 K/uL (ref 0.0–0.5)
Eosinophils Relative: 2 %
HCT: 33.5 % — ABNORMAL LOW (ref 36.0–46.0)
Hemoglobin: 10.8 g/dL — ABNORMAL LOW (ref 12.0–15.0)
Immature Granulocytes: 1 %
Lymphocytes Relative: 19 %
Lymphs Abs: 2.3 K/uL (ref 0.7–4.0)
MCH: 29.9 pg (ref 26.0–34.0)
MCHC: 32.2 g/dL (ref 30.0–36.0)
MCV: 92.8 fL (ref 80.0–100.0)
Monocytes Absolute: 0.7 K/uL (ref 0.1–1.0)
Monocytes Relative: 6 %
Neutro Abs: 8.4 K/uL — ABNORMAL HIGH (ref 1.7–7.7)
Neutrophils Relative %: 72 %
Platelets: 190 K/uL (ref 150–400)
RBC: 3.61 MIL/uL — ABNORMAL LOW (ref 3.87–5.11)
RDW: 15.2 % (ref 11.5–15.5)
WBC: 11.8 K/uL — ABNORMAL HIGH (ref 4.0–10.5)
nRBC: 0 % (ref 0.0–0.2)

## 2023-10-28 LAB — COMPREHENSIVE METABOLIC PANEL WITH GFR
ALT: 17 U/L (ref 0–44)
AST: 22 U/L (ref 15–41)
Albumin: 3 g/dL — ABNORMAL LOW (ref 3.5–5.0)
Alkaline Phosphatase: 57 U/L (ref 38–126)
Anion gap: 13 (ref 5–15)
BUN: 38 mg/dL — ABNORMAL HIGH (ref 8–23)
CO2: 18 mmol/L — ABNORMAL LOW (ref 22–32)
Calcium: 9.5 mg/dL (ref 8.9–10.3)
Chloride: 101 mmol/L (ref 98–111)
Creatinine, Ser: 2.72 mg/dL — ABNORMAL HIGH (ref 0.44–1.00)
GFR, Estimated: 17 mL/min — ABNORMAL LOW (ref >60)
Glucose, Bld: 152 mg/dL — ABNORMAL HIGH (ref 70–99)
Potassium: 4.1 mmol/L (ref 3.5–5.1)
Sodium: 132 mmol/L — ABNORMAL LOW (ref 135–145)
Total Bilirubin: 0.5 mg/dL (ref 0.0–1.2)
Total Protein: 6.4 g/dL — ABNORMAL LOW (ref 6.5–8.1)

## 2023-10-28 LAB — URINALYSIS, ROUTINE W REFLEX MICROSCOPIC
Bilirubin Urine: NEGATIVE
Glucose, UA: 150 mg/dL — AB
Hgb urine dipstick: NEGATIVE
Ketones, ur: NEGATIVE mg/dL
Nitrite: NEGATIVE
Protein, ur: 100 mg/dL — AB
Specific Gravity, Urine: 1.008 (ref 1.005–1.030)
pH: 7 (ref 5.0–8.0)

## 2023-10-28 LAB — I-STAT CHEM 8, ED
BUN: 38 mg/dL — ABNORMAL HIGH (ref 8–23)
Calcium, Ion: 1.2 mmol/L (ref 1.15–1.40)
Chloride: 106 mmol/L (ref 98–111)
Creatinine, Ser: 3.1 mg/dL — ABNORMAL HIGH (ref 0.44–1.00)
Glucose, Bld: 146 mg/dL — ABNORMAL HIGH (ref 70–99)
HCT: 33 % — ABNORMAL LOW (ref 36.0–46.0)
Hemoglobin: 11.2 g/dL — ABNORMAL LOW (ref 12.0–15.0)
Potassium: 4.1 mmol/L (ref 3.5–5.1)
Sodium: 135 mmol/L (ref 135–145)
TCO2: 18 mmol/L — ABNORMAL LOW (ref 22–32)

## 2023-10-28 LAB — RESP PANEL BY RT-PCR (RSV, FLU A&B, COVID)  RVPGX2
Influenza A by PCR: NEGATIVE
Influenza B by PCR: NEGATIVE
Resp Syncytial Virus by PCR: NEGATIVE
SARS Coronavirus 2 by RT PCR: NEGATIVE

## 2023-10-28 LAB — TROPONIN I (HIGH SENSITIVITY)
Troponin I (High Sensitivity): 17 ng/L (ref <18)
Troponin I (High Sensitivity): 19 ng/L — ABNORMAL HIGH (ref <18)

## 2023-10-28 LAB — BRAIN NATRIURETIC PEPTIDE: B Natriuretic Peptide: 315.3 pg/mL — ABNORMAL HIGH (ref 0.0–100.0)

## 2023-10-28 LAB — LIPASE, BLOOD: Lipase: 56 U/L — ABNORMAL HIGH (ref 11–51)

## 2023-10-28 MED ORDER — LEVOFLOXACIN 500 MG PO TABS
500.0000 mg | ORAL_TABLET | Freq: Once | ORAL | Status: AC
  Administered 2023-10-28: 500 mg via ORAL
  Filled 2023-10-28: qty 1

## 2023-10-28 MED ORDER — LEVOFLOXACIN 500 MG PO TABS: 500.0000 mg | ORAL_TABLET | Freq: Every day | ORAL | 0 refills | Status: DC

## 2023-10-28 MED ORDER — FUROSEMIDE 10 MG/ML IJ SOLN
60.0000 mg | Freq: Once | INTRAMUSCULAR | Status: AC
Start: 2023-10-28 — End: 2023-10-28
  Administered 2023-10-28: 60 mg via INTRAVENOUS
  Filled 2023-10-28: qty 6

## 2023-10-28 MED ORDER — LEVOFLOXACIN 500 MG PO TABS: 500.0000 mg | ORAL_TABLET | ORAL | 0 refills | Status: AC

## 2023-10-28 MED ORDER — ONDANSETRON HCL 4 MG/2ML IJ SOLN
4.0000 mg | Freq: Once | INTRAMUSCULAR | Status: AC
  Administered 2023-10-28: 4 mg via INTRAVENOUS
  Filled 2023-10-28: qty 2

## 2023-10-28 NOTE — Telephone Encounter (Signed)
 [name] returns call to nurse line regarding symptoms. He reports that she has been vomiting and having continued issues with congestion. O2 saturation has been below baseline. Reports lowest level of 90%.   He is asking that we cancel ATC appt for this afternoon, as he is going to call ambulance to take patient to ED.   Cancelled ATC appt.   [name] asked if we would be able to arrange [facility] transport to the ED. Recommended calling 911 due to concerns for breathing/ low O2 levels.   [name] is going to call now. He asked that I send message to Dr. Delores to advise her of this.   [name], RN

## 2023-10-28 NOTE — ED Notes (Signed)
 2nd trop due @ 1700, Extra Blue & SST drawn

## 2023-10-28 NOTE — Discharge Instructions (Addendum)
 Your testing today has been reassuring, there was no signs of pneumonia on the CT scan.  It appears that you may very well have some urinary infection but no other findings of concern on the CT scan.  Please take Levaquin  every other day for the next 7 days, to complete 4 doses, your next dose will be on Wednesday  Thank you for allowing us  to treat you in the emergency department today.  After reviewing your examination and potential testing that was done it appears that you are safe to go home.  I would like for you to follow-up with your doctor within the next several days, have them obtain your records and follow-up with them to review all potential tests and results from your visit.  If you should develop severe or worsening symptoms return to the emergency department immediately

## 2023-10-28 NOTE — ED Provider Notes (Signed)
 [place] [facility] DE[facility]MENT AT [facility] Provider Note   CSN: [id number] Arrival date & time: 10/28/23  1238     Patient presents with: Weakness and Fatigue   Abigail Wallace is a 78 y.o. female.   History obtained per patient and her son   78 year old female history of COPD, diabetes, CHF, HTN, CAD, and atrial fibrillation on Eliquis  who presents to the emergency department with abdominal pain and nausea and vomiting and shortness of breath.  Over the past week and a half the patient has had congestion and productive cough.  Has had worsening shortness of breath over that time as well.  Has had a fever of 100.7 at home.  Also reports more recently experiencing abdominal pain or hernias and nausea and vomiting.  Still passing gas.  Has had increased swelling of her lower extremities as well.       Prior to Admission medications   Medication Sig Start Date End Date Taking? Authorizing Provider  acetaminophen  (TYLENOL ) 500 MG tablet Take 500 mg by mouth every 6 (six) hours as needed for mild pain (pain score 1-3) or headache.    [provider]  albuterol  (VENTOLIN  HFA) 108 (90 Base) MCG/ACT inhaler Inhale 2 puffs into the lungs every 6 (six) hours as needed for wheezing or shortness of breath. 09/03/23   Delores Suzann HERO, MD  allopurinol  (ZYLOPRIM ) 100 MG tablet Take 0.5 tablets (50 mg total) by mouth every other day. 09/19/23   Delores Suzann HERO, MD  amLODipine  (NORVASC ) 10 MG tablet Take 1 tablet (10 mg total) by mouth at bedtime. 10/11/23   Delores Suzann HERO, MD  amoxicillin  (AMOXIL ) 250 MG capsule Take 1 capsule (250 mg total) by mouth 2 (two) times daily. 10/19/23   Donah Laymon PARAS, MD  apixaban  (ELIQUIS ) 2.5 MG TABS tablet Take 1 tablet (2.5 mg total) by mouth 2 (two) times daily. 12/11/22   Delores Suzann HERO, MD  cholecalciferol  (VITAMIN D3) 25 MCG (1000 UNIT) tablet Take 1 tablet (1,000 Units total) by mouth daily. 08/12/23   Rumball, Alison M, DO   diclofenac  Sodium (VOLTAREN ) 1 % GEL Apply 2 g topically 4 (four) times daily as needed (pain). 08/19/23   Delores Suzann HERO, MD  divalproex  (DEPAKOTE ) 250 MG DR tablet Take 1 tablet (250 mg total) by mouth every 12 (twelve) hours. 03/05/22   Samtani, Jai-Gurmukh, MD  Fluticasone -Umeclidin-Vilant (TRELEGY ELLIPTA ) 100-62.5-25 MCG/ACT AEPB Inhale 1 puff into the lungs daily. 05/20/23   Delores Suzann HERO, MD  furosemide  (LASIX ) 40 MG tablet Take 1.5 tablets (60 mg total) by mouth every other day. 09/28/23 09/27/24  Janna Ferrier, DO  glucose blood (ACCU-CHEK GUIDE TEST) test strip Use to check blood glucose once daily and as needed for symptoms 08/19/23   Delores Suzann HERO, MD  ipratropium-albuterol  (DUONEB) 0.5-2.5 (3) MG/3ML SOLN Take 3 mLs by nebulization every 4 (four) hours as needed. 09/04/23   Delores Suzann HERO, MD  metoprolol  tartrate (LOPRESSOR ) 100 MG tablet Take 1 tablet (100 mg total) by mouth 3 (three) times daily. 09/04/23   Delores Suzann HERO, MD  Multiple Vitamins-Minerals (MULTIVITAMIN WOMEN 50+) TABS Take 1 tablet by mouth daily.    [provider]  nitroGLYCERIN  (NITROSTAT ) 0.4 MG SL tablet Place 1 tablet (0.4 mg total) under the tongue every 5 (five) minutes as needed for chest pain. 08/03/22 01/15/24  Croitoru, Mihai, MD  nystatin  cream (MYCOSTATIN ) Apply 1 Application topically daily as needed (Rash).    [provider]  nystatin  powder Apply 1 Application topically daily. Apply after bathing. 10/04/23   Delores Suzann HERO, MD  OLANZapine  (ZYPREXA ) 10 MG tablet Take 10 mg by mouth at bedtime. 05/17/23   [provider]  OVER THE COUNTER MEDICATION Apply 1 application  topically See admin instructions. Caldesene Medicated Protecting Powder- Apply under the breasts and between the skin folds 2 times a day as needed for irritation    [provider]  polyethylene glycol powder (GLYCOLAX /MIRALAX ) 17 GM/SCOOP powder Take 17 g by mouth 3 (three) times daily. 12/11/22   Delores Suzann HERO, MD  pravastatin  (PRAVACHOL ) 40 MG tablet Take 1 tablet (40 mg total) by mouth every evening. Patient taking differently: Take 40 mg by mouth at bedtime. 09/25/23   Croitoru, Mihai, MD  Semaglutide ,0.25 or 0.5MG /DOS, 2 MG/1.5ML SOPN Inject 0.25 mg into the skin once a week. 10/11/23   Delores Suzann HERO, MD  senna (SENOKOT) 8.6 MG TABS tablet Take 1 tablet (8.6 mg total) by mouth daily as needed for mild constipation. Patient taking differently: Take 1 tablet by mouth daily. 05/16/23   Delores Suzann HERO, MD  sodium bicarbonate  650 MG tablet Take 1 tablet (650 mg total) by mouth 3 (three) times daily. 09/25/23   Delores Suzann HERO, MD  triamcinolone  ointment (KENALOG ) 0.5 % Apply 1 Application topically 2 (two) times daily as needed (skin irritation). 10/04/23   Delores Suzann HERO, MD    Allergies: Abilify [aripiprazole], Keflex  [cephalexin ], Versacloz  [clozapine ], Ativan  [lorazepam ], Benadryl [diphenhydramine hcl], Benztropine , Latuda [lurasidone hcl], Remeron [mirtazapine], Haldol  [haloperidol  lactate], Codeine, and Latex    Review of Systems  Updated Vital Signs BP (!) 147/57   Pulse 77   Temp 97.8 F (36.6 C) (Oral)   Resp 19   SpO2 100%   Physical Exam Vitals and nursing note reviewed.  Constitutional:      General: She is not in acute distress.    Appearance: She is well-developed. She is obese.  HENT:     Head: Normocephalic and atraumatic.     Right Ear: External ear normal.     Left Ear: External ear normal.     Nose: Nose normal.  Eyes:     Extraocular Movements: Extraocular movements intact.     Conjunctiva/sclera: Conjunctivae normal.     Pupils: Pupils are equal, round, and reactive to light.  Cardiovascular:     Rate and Rhythm: Normal rate and regular rhythm.     Heart sounds: No murmur heard. Pulmonary:     Effort: Pulmonary effort is normal. No respiratory distress.     Comments: Diminished over left base Abdominal:     General: Abdomen is flat. There is distension.      Palpations: Abdomen is soft. There is no mass.     Tenderness: There is abdominal tenderness (Periumbilical hernia noted.  Tender.  Not reproducible.). There is no guarding.  Musculoskeletal:     Cervical back: Normal range of motion and neck supple.     Right lower leg: Edema present.     Left lower leg: Edema present.  Skin:    General: Skin is warm and dry.  Neurological:     Mental Status: She is alert and oriented to person, place, and time. Mental status is at baseline.  Psychiatric:        Mood and Affect: Mood normal.     (all labs ordered are listed, but only abnormal results are displayed) Labs Reviewed  COMPREHENSIVE METABOLIC PANEL WITH GFR - Abnormal; Notable  for the following components:      Result Value   Sodium 132 (*)    CO2 18 (*)    Glucose, Bld 152 (*)    BUN 38 (*)    Creatinine, Ser 2.72 (*)    Total Protein 6.4 (*)    Albumin 3.0 (*)    GFR, Estimated 17 (*)    All other components within normal limits  LIPASE, BLOOD - Abnormal; Notable for the following components:   Lipase 56 (*)    All other components within normal limits  CBC WITH DIFFERENTIAL/PLATELET - Abnormal; Notable for the following components:   WBC 11.8 (*)    RBC 3.61 (*)    Hemoglobin 10.8 (*)    HCT 33.5 (*)    Neutro Abs 8.4 (*)    Abs Immature Granulocytes 0.16 (*)    All other components within normal limits  URINALYSIS, ROUTINE W REFLEX MICROSCOPIC - Abnormal; Notable for the following components:   APPearance HAZY (*)    Glucose, UA 150 (*)    Protein, ur 100 (*)    Leukocytes,Ua SMALL (*)    Bacteria, UA MANY (*)    All other components within normal limits  BRAIN NATRIURETIC PEPTIDE - Abnormal; Notable for the following components:   B Natriuretic Peptide 315.3 (*)    All other components within normal limits  I-STAT CHEM 8, ED - Abnormal; Notable for the following components:   BUN 38 (*)    Creatinine, Ser 3.10 (*)    Glucose, Bld 146 (*)    TCO2 18 (*)     Hemoglobin 11.2 (*)    HCT 33.0 (*)    All other components within normal limits  I-STAT VENOUS BLOOD GAS, ED - Abnormal; Notable for the following components:   pCO2, Ven 30.4 (*)    pO2, Ven 46 (*)    Bicarbonate 17.8 (*)    TCO2 19 (*)    Acid-base deficit 6.0 (*)    Sodium 134 (*)    HCT 32.0 (*)    Hemoglobin 10.9 (*)    All other components within normal limits  RESP PANEL BY RT-PCR (RSV, FLU A&B, COVID)  RVPGX2  URINE CULTURE  BLOOD GAS, VENOUS  TROPONIN I (HIGH SENSITIVITY)  TROPONIN I (HIGH SENSITIVITY)    EKG: EKG Interpretation Date/Time:  Monday October 28 2023 12:56:48 EDT Ventricular Rate:  80 PR Interval:  177 QRS Duration:  171 QT Interval:  437 QTC Calculation: 505 R Axis:   268  Text Interpretation: Sinus rhythm Nonspecific IVCD with LAD Borderline T abnormalities, lateral leads Confirmed by Yolande Charleston [phone]) on 10/28/2023 4:44:50 PM  Radiology: [facility] Chest Portable 1 View Result Date: 10/28/2023 CLINICAL DATA:  Weakness. EXAM: PORTABLE CHEST 1 VIEW COMPARISON:  09/26/2023 FINDINGS: Limited examination due to body habitus and position of the patient. The heart is mildly enlarged but appears stable. Stable tortuosity and calcification of the thoracic aorta. The pacer wires are stable. Difficult to assess the left lower lobe but the remainder of the lung fields are grossly clear. IMPRESSION: 1. Limited examination. 2. Stable cardiac enlargement. 3. Difficult to assess the left lower lobe due to positioning and body habitus. The remainder of the lungs are clear. Electronically Signed   By: MYRTIS Stammer M.D.   On: 10/28/2023 14:20     Procedures   Medications Ordered in the ED  ondansetron  (ZOFRAN ) injection 4 mg (has no administration in time range)  furosemide  (LASIX ) injection 60 mg (has  no administration in time range)    Clinical Course as of 10/28/23 1714  Mon Oct 28, 2023  1650 Creatinine(!): 2.72 At baseline  [RP]  1654 WBC(!):  11.8 Baseline 12 [RP]  1713 Signed out to Dr Cleotilde [RP]    Clinical Course User Index [RP] Yolande [name] BROCKS, MD                                 Medical Decision Making Amount and/or Complexity of Data Reviewed Labs: ordered. Decision-making details documented in ED Course. Radiology: ordered.  Risk Prescription drug management.   Abigail Wallace is a 78 year old female history of COPD, diabetes, CHF, HTN, CAD, and atrial fibrillation on Eliquis  who presents to the emergency department with abdominal pain and nausea and vomiting and shortness of breath.   Initial Ddx:  Pneumonia, URI, urinary tract infection, strangulated hernia, bowel obstruction  MDM/Course:  Patient presents emergency department with shortness of breath and cough.  Also has had some fevers at home.  Also reports lower extremity swelling and abdominal pain with vomiting.  On exam she is afebrile.  Satting well on room air.  Does have hernia that is very large and difficult to be reduced.  Lungs are diminished in the left base but otherwise clear to auscultation I do not appreciate any wheezing.  She had an x-ray which upon my read appears to show a left-sided pleural effusion.  Lab work showed mild leukocytosis which she typically has.  Also has CKD.  Was given some Lasix  for her volume overload which I suspect may be contributing to her shortness of breath.  Do have her ordered for CT of the chest, abdomen, and pelvis to evaluate for pneumonia or bowel obstruction.  Urinalysis does appear to be consistent with urinary tract infection.  If imaging is negative this could be potentially causing her symptoms.  Signed out to the oncoming physician awaiting imaging results and final dispo.   This patient presents to the ED for concern of complaints listed in HPI, this involves an extensive number of treatment options, and is a complaint that carries with it a high risk of complications and morbidity. Disposition  including potential need for admission considered.   Dispo: Pending remainder of workup  Additional history obtained from son Records reviewed Outpatient Clinic Notes The following labs were independently interpreted: Chemistry and show CKD I independently reviewed the following imaging with scope of interpretation limited to determining acute life threatening conditions related to emergency care: Chest x-ray and agree with the radiologist interpretation with the following exceptions: none I personally reviewed and interpreted cardiac monitoring: normal sinus rhythm  I personally reviewed and interpreted the pt's EKG: see above for interpretation  I have reviewed the patients home medications and made adjustments as needed Social Determinants of health:  Geriatric  Portions of this note were generated with Scientist, clinical (histocompatibility and immunogenetics). Dictation errors may occur despite best attempts at proofreading.     Final diagnoses:  Generalized weakness  Subacute cough  Shortness of breath  Abdominal pain, unspecified abdominal location  Nausea and vomiting, unspecified vomiting type  Urinary tract infection associated with indwelling urethral catheter, initial encounter    ED Discharge Orders     None          Yolande [name] BROCKS, MD 10/28/23 1704

## 2023-10-28 NOTE — ED Triage Notes (Signed)
 BIB [facility] from home for malaise for the past 2 days. Hx of paranoid schizophrenia , 170/80 HR 90 96% RA 174 CBG Foley in place

## 2023-10-29 ENCOUNTER — Telehealth: Payer: Self-pay

## 2023-10-29 NOTE — Telephone Encounter (Signed)
 Called [facility] and spoke with Manuelito.   Orders given for CBC, CMP, and Foley catheter change with urine culture off new cath.

## 2023-10-29 NOTE — Telephone Encounter (Signed)
 [name] calls nurse line requesting to speak with Dr. Delores.   He reports they are home now and he would like to discuss the lab work and medication changes made.   He asks you call him ~3pm if you are able.   Will forward to PCP.

## 2023-10-29 NOTE — Telephone Encounter (Signed)
 Called son. Discussed at length. ED did not change Foley thus urine culture not accurate. Discussed at length, will pause on Levaquin  at this time.  She eating well at this time.   RN Team Please call [name]. Need repeat CMP, CBC, and Foley catheter change (again) with Urine culture.  Suzann Daring, MD  Family Medicine Teaching Service

## 2023-10-30 DIAGNOSIS — I130 Hypertensive heart and chronic kidney disease with heart failure and stage 1 through stage 4 chronic kidney disease, or unspecified chronic kidney disease: Secondary | ICD-10-CM | POA: Diagnosis not present

## 2023-10-30 LAB — URINE CULTURE: Culture: >=100000 — AB

## 2023-10-31 ENCOUNTER — Telehealth (HOSPITAL_BASED_OUTPATIENT_CLINIC_OR_DEPARTMENT_OTHER): Payer: Self-pay | Admitting: *Deleted

## 2023-10-31 NOTE — Telephone Encounter (Signed)
 Post ED Visit - Positive Culture Follow-up  Culture report reviewed by antimicrobial stewardship pharmacist: [name] [x]  Rankin Dee, Pharm.D. []  Venetia Gully, [address].D., BCPS AQ-ID []  Garrel Crews, Pharm.D., BCPS []  Almarie Lunger, [address].D., BCPS []  [place], [address].D., BCPS, AAHIVP []  Rosaline Bihari, Pharm.D., BCPS, AAHIVP []  Vernell Meier, PharmD, BCPS []  Latanya Hint, PharmD, BCPS []  Donald Medley, PharmD, BCPS []  Rocky Bold, PharmD []  Dorothyann Alert, PharmD, BCPS []  Morene Babe, PharmD  [facility] []  Rosaline Edison, PharmD []  Romona Bliss, PharmD []  Dolphus Roller, PharmD []  Veva Seip, Rph []  Vernell Daunt) Leonce, PharmD []  Eva Allis, PharmD []  Rosaline Millet, PharmD []  Iantha Batch, PharmD []  Arvin Gauss, PharmD []  Wanda Hasting, PharmD []  Ronal Rav, PharmD []  Rocky Slade, PharmD []  Bard Jeans, PharmD   Positive urine culture Treated with Levofloxacin , organism sensitive to the same and no further patient follow-up is required at this time.  Abigail Wallace 10/31/2023, 3:13 PM

## 2023-11-02 LAB — LAB REPORT - SCANNED: EGFR: 14

## 2023-11-03 DIAGNOSIS — J811 Chronic pulmonary edema: Secondary | ICD-10-CM | POA: Diagnosis not present

## 2023-11-04 ENCOUNTER — Encounter: Payer: Self-pay | Admitting: Family Medicine

## 2023-11-04 DIAGNOSIS — J449 Chronic obstructive pulmonary disease, unspecified: Secondary | ICD-10-CM | POA: Diagnosis not present

## 2023-11-04 DIAGNOSIS — I951 Orthostatic hypotension: Secondary | ICD-10-CM | POA: Diagnosis not present

## 2023-11-04 DIAGNOSIS — M6281 Muscle weakness (generalized): Secondary | ICD-10-CM | POA: Diagnosis not present

## 2023-11-04 DIAGNOSIS — M179 Osteoarthritis of knee, unspecified: Secondary | ICD-10-CM | POA: Diagnosis not present

## 2023-11-05 ENCOUNTER — Telehealth: Payer: Self-pay | Admitting: Family Medicine

## 2023-11-05 DIAGNOSIS — M179 Osteoarthritis of knee, unspecified: Secondary | ICD-10-CM | POA: Diagnosis not present

## 2023-11-05 DIAGNOSIS — J449 Chronic obstructive pulmonary disease, unspecified: Secondary | ICD-10-CM | POA: Diagnosis not present

## 2023-11-05 DIAGNOSIS — I951 Orthostatic hypotension: Secondary | ICD-10-CM | POA: Diagnosis not present

## 2023-11-05 NOTE — Telephone Encounter (Addendum)
 Patient has worsening creatinine and lower hemoglobin  RN Team Please call [name] - BMP, CBC, ferritin, and iron panel next week Please also them to fax results of urine culture.   Suzann Daring, MD  Family Medicine Teaching Service

## 2023-11-05 NOTE — Telephone Encounter (Signed)
 Called [name].  Verbal order given to [facility] for BMP, CMP, ferritin, and iron panel for next week.   She reports she will fax over urine culture results.   Number given.

## 2023-11-05 NOTE — Telephone Encounter (Signed)
 Called son, Mr. Renay Arenas.   Patient doing okay. She has some skin breakdown in infraumbilical area. Son will send photo. [name] to evaluate   Labs are as follows  WBC 11.1  Hgb 9.5  Hct 29 MCV 94  Platelets 194   Creatinine 3.30 BUN 42  Na 137 K  4.0   Hold Lasix  Wednesday/Friday then repeat next weeks   Check ferritin, TIBC, iron next month  All questions answered.  Suzann Daring, MD  Family Medicine Teaching Service

## 2023-11-07 ENCOUNTER — Encounter: Payer: Self-pay | Admitting: Family Medicine

## 2023-11-08 ENCOUNTER — Telehealth: Payer: Self-pay | Admitting: Family Medicine

## 2023-11-08 ENCOUNTER — Ambulatory Visit: Payer: Self-pay | Admitting: Family Medicine

## 2023-11-08 DIAGNOSIS — E119 Type 2 diabetes mellitus without complications: Secondary | ICD-10-CM

## 2023-11-08 MED ORDER — ACCU-CHEK GUIDE TEST VI STRP: ORAL_STRIP | 12 refills | Status: AC

## 2023-11-08 MED ORDER — FREESTYLE LANCETS MISC: 12 refills | Status: AC

## 2023-11-08 NOTE — Telephone Encounter (Signed)
 Called patient and her son (Mr. Tennie).   Patient having increasing anxiety. Discussed options, has close follow up with Psychiatry.   Suzann Daring, MD  Family Medicine Teaching Service

## 2023-11-11 MED ORDER — TRELEGY ELLIPTA 100-62.5-25 MCG/ACT IN AEPB: 1.0000 | INHALATION_SPRAY | Freq: Every day | RESPIRATORY_TRACT | 3 refills | Status: AC

## 2023-11-11 MED ORDER — ALBUTEROL SULFATE HFA 108 (90 BASE) MCG/ACT IN AERS
2.0000 | INHALATION_SPRAY | Freq: Four times a day (QID) | RESPIRATORY_TRACT | 3 refills | Status: AC | PRN
Start: 2023-11-11 — End: ?

## 2023-11-11 MED ORDER — IPRATROPIUM-ALBUTEROL 0.5-2.5 (3) MG/3ML IN SOLN: 3.0000 mL | RESPIRATORY_TRACT | 3 refills | Status: AC | PRN

## 2023-11-11 NOTE — Telephone Encounter (Signed)
 Called son and discussed care.   He is going to send Rx for bed to facilitate transfers as patient can no longer maneuver as easily  Suzann Daring, MD  [facility] Medicine Teaching Service

## 2023-11-11 NOTE — Addendum Note (Signed)
 Addended by: GLENDIA DAMIEN SQUIBB on: 11/11/2023 09:48 AM   Modules accepted: Orders

## 2023-11-12 ENCOUNTER — Telehealth: Payer: Self-pay | Admitting: Family Medicine

## 2023-11-12 DIAGNOSIS — M6281 Muscle weakness (generalized): Secondary | ICD-10-CM

## 2023-11-12 DIAGNOSIS — I480 Paroxysmal atrial fibrillation: Secondary | ICD-10-CM

## 2023-11-12 DIAGNOSIS — Z978 Presence of other specified devices: Secondary | ICD-10-CM

## 2023-11-12 DIAGNOSIS — I5032 Chronic diastolic (congestive) heart failure: Secondary | ICD-10-CM

## 2023-11-12 DIAGNOSIS — J411 Mucopurulent chronic bronchitis: Secondary | ICD-10-CM

## 2023-11-12 NOTE — Telephone Encounter (Signed)
 Called [employer]. Spoke with Eloy. She states that this type of bed will not be covered by insurance, given that it is a specialty bed.   States that the bed that rotates would be 3299.99, bed that does not rotate will be 2399.99.   Advised that patient can reach out to discuss tax exemptions and other payment plans.   Called [name] and advised of update. He asked that I go ahead and fax order over and that he will call and speak with [facility] regarding payment options.   Faxed order and demographics to [facility] at [phone].  [name], RN

## 2023-11-12 NOTE — Telephone Encounter (Signed)
 Patient has need for DME. I have ordered a sit to stand hospital bed, bariatric, with gel overlay given patient's change in ability to reposition herself in bed. I am routing note for [facility] RN Pool.   Suzann CHRISTELLA Daring, MD

## 2023-11-17 DIAGNOSIS — D329 Benign neoplasm of meninges, unspecified: Secondary | ICD-10-CM | POA: Diagnosis not present

## 2023-11-17 DIAGNOSIS — J441 Chronic obstructive pulmonary disease with (acute) exacerbation: Secondary | ICD-10-CM | POA: Diagnosis not present

## 2023-11-21 ENCOUNTER — Other Ambulatory Visit: Payer: Self-pay

## 2023-11-21 MED ORDER — NYSTATIN 100000 UNIT/GM EX CREA: 1.0000 | TOPICAL_CREAM | Freq: Every day | CUTANEOUS | 1 refills | Status: AC | PRN

## 2023-11-24 ENCOUNTER — Emergency Department (HOSPITAL_COMMUNITY)

## 2023-11-24 ENCOUNTER — Inpatient Hospital Stay (HOSPITAL_COMMUNITY)
Admission: EM | Admit: 2023-11-24 | Discharge: 2023-11-26 | DRG: 291 | Disposition: A | Discharge status: Home / Self Care | Attending: Family Medicine | Admitting: Family Medicine

## 2023-11-24 ENCOUNTER — Inpatient Hospital Stay (HOSPITAL_COMMUNITY)

## 2023-11-24 ENCOUNTER — Encounter (HOSPITAL_COMMUNITY): Payer: Self-pay

## 2023-11-24 ENCOUNTER — Other Ambulatory Visit: Payer: Self-pay

## 2023-11-24 DIAGNOSIS — Z960 Presence of urogenital implants: Secondary | ICD-10-CM | POA: Diagnosis not present

## 2023-11-24 DIAGNOSIS — Z950 Presence of cardiac pacemaker: Secondary | ICD-10-CM | POA: Diagnosis not present

## 2023-11-24 DIAGNOSIS — Z7985 Long-term (current) use of injectable non-insulin antidiabetic drugs: Secondary | ICD-10-CM

## 2023-11-24 DIAGNOSIS — I7143 Infrarenal abdominal aortic aneurysm, without rupture: Secondary | ICD-10-CM | POA: Diagnosis present

## 2023-11-24 DIAGNOSIS — I2510 Atherosclerotic heart disease of native coronary artery without angina pectoris: Secondary | ICD-10-CM | POA: Diagnosis present

## 2023-11-24 DIAGNOSIS — Z79899 Other long term (current) drug therapy: Secondary | ICD-10-CM

## 2023-11-24 DIAGNOSIS — Z6841 Body Mass Index (BMI) 40.0 and over, adult: Secondary | ICD-10-CM | POA: Diagnosis not present

## 2023-11-24 DIAGNOSIS — M160 Bilateral primary osteoarthritis of hip: Secondary | ICD-10-CM | POA: Diagnosis present

## 2023-11-24 DIAGNOSIS — N1832 Chronic kidney disease, stage 3b: Secondary | ICD-10-CM | POA: Diagnosis present

## 2023-11-24 DIAGNOSIS — Z888 Allergy status to other drugs, medicaments and biological substances status: Secondary | ICD-10-CM

## 2023-11-24 DIAGNOSIS — D631 Anemia in chronic kidney disease: Secondary | ICD-10-CM | POA: Diagnosis present

## 2023-11-24 DIAGNOSIS — M109 Gout, unspecified: Secondary | ICD-10-CM | POA: Diagnosis present

## 2023-11-24 DIAGNOSIS — E785 Hyperlipidemia, unspecified: Secondary | ICD-10-CM | POA: Diagnosis present

## 2023-11-24 DIAGNOSIS — Z7901 Long term (current) use of anticoagulants: Secondary | ICD-10-CM | POA: Diagnosis not present

## 2023-11-24 DIAGNOSIS — G2401 Drug induced subacute dyskinesia: Secondary | ICD-10-CM | POA: Diagnosis present

## 2023-11-24 DIAGNOSIS — Z8249 Family history of ischemic heart disease and other diseases of the circulatory system: Secondary | ICD-10-CM

## 2023-11-24 DIAGNOSIS — I495 Sick sinus syndrome: Secondary | ICD-10-CM | POA: Diagnosis present

## 2023-11-24 DIAGNOSIS — F200 Paranoid schizophrenia: Secondary | ICD-10-CM | POA: Diagnosis present

## 2023-11-24 DIAGNOSIS — Z881 Allergy status to other antibiotic agents status: Secondary | ICD-10-CM

## 2023-11-24 DIAGNOSIS — R0600 Dyspnea, unspecified: Secondary | ICD-10-CM | POA: Diagnosis not present

## 2023-11-24 DIAGNOSIS — E1122 Type 2 diabetes mellitus with diabetic chronic kidney disease: Secondary | ICD-10-CM | POA: Diagnosis present

## 2023-11-24 DIAGNOSIS — Z885 Allergy status to narcotic agent status: Secondary | ICD-10-CM

## 2023-11-24 DIAGNOSIS — Z87891 Personal history of nicotine dependence: Secondary | ICD-10-CM | POA: Diagnosis not present

## 2023-11-24 DIAGNOSIS — Z978 Presence of other specified devices: Secondary | ICD-10-CM

## 2023-11-24 DIAGNOSIS — I130 Hypertensive heart and chronic kidney disease with heart failure and stage 1 through stage 4 chronic kidney disease, or unspecified chronic kidney disease: Secondary | ICD-10-CM | POA: Diagnosis present

## 2023-11-24 DIAGNOSIS — E66813 Obesity, class 3: Secondary | ICD-10-CM | POA: Diagnosis present

## 2023-11-24 DIAGNOSIS — E119 Type 2 diabetes mellitus without complications: Secondary | ICD-10-CM

## 2023-11-24 DIAGNOSIS — M545 Low back pain, unspecified: Secondary | ICD-10-CM | POA: Diagnosis present

## 2023-11-24 DIAGNOSIS — Z7951 Long term (current) use of inhaled steroids: Secondary | ICD-10-CM

## 2023-11-24 DIAGNOSIS — Z789 Other specified health status: Secondary | ICD-10-CM

## 2023-11-24 DIAGNOSIS — Z833 Family history of diabetes mellitus: Secondary | ICD-10-CM

## 2023-11-24 DIAGNOSIS — Z91040 Latex allergy status: Secondary | ICD-10-CM

## 2023-11-24 DIAGNOSIS — J441 Chronic obstructive pulmonary disease with (acute) exacerbation: Secondary | ICD-10-CM | POA: Diagnosis present

## 2023-11-24 DIAGNOSIS — Z823 Family history of stroke: Secondary | ICD-10-CM

## 2023-11-24 DIAGNOSIS — I5033 Acute on chronic diastolic (congestive) heart failure: Secondary | ICD-10-CM | POA: Diagnosis present

## 2023-11-24 DIAGNOSIS — I509 Heart failure, unspecified: Secondary | ICD-10-CM

## 2023-11-24 DIAGNOSIS — Z1152 Encounter for screening for COVID-19: Secondary | ICD-10-CM

## 2023-11-24 DIAGNOSIS — I4891 Unspecified atrial fibrillation: Secondary | ICD-10-CM | POA: Diagnosis present

## 2023-11-24 DIAGNOSIS — D649 Anemia, unspecified: Secondary | ICD-10-CM | POA: Insufficient documentation

## 2023-11-24 LAB — URINALYSIS, MICROSCOPIC (REFLEX)

## 2023-11-24 LAB — URINALYSIS, ROUTINE W REFLEX MICROSCOPIC
Bilirubin Urine: NEGATIVE
Bilirubin Urine: NEGATIVE
Glucose, UA: 250 mg/dL — AB
Glucose, UA: 50 mg/dL — AB
Hgb urine dipstick: NEGATIVE
Ketones, ur: NEGATIVE mg/dL
Ketones, ur: NEGATIVE mg/dL
Leukocytes,Ua: NEGATIVE
Nitrite: POSITIVE — AB
Nitrite: NEGATIVE
Protein, ur: 100 mg/dL — AB
Protein, ur: 30 mg/dL — AB
Specific Gravity, Urine: 1.015 (ref 1.005–1.030)
Specific Gravity, Urine: 1.006 (ref 1.005–1.030)
pH: 6.5 (ref 5.0–8.0)
pH: 6 (ref 5.0–8.0)

## 2023-11-24 LAB — BASIC METABOLIC PANEL WITH GFR
Anion gap: 15 (ref 5–15)
BUN: 41 mg/dL — ABNORMAL HIGH (ref 8–23)
CO2: 15 mmol/L — ABNORMAL LOW (ref 22–32)
Calcium: 9.2 mg/dL (ref 8.9–10.3)
Chloride: 101 mmol/L (ref 98–111)
Creatinine, Ser: 2.95 mg/dL — ABNORMAL HIGH (ref 0.44–1.00)
GFR, Estimated: 16 mL/min — ABNORMAL LOW (ref >60)
Glucose, Bld: 156 mg/dL — ABNORMAL HIGH (ref 70–99)
Potassium: 4.8 mmol/L (ref 3.5–5.1)
Sodium: 131 mmol/L — ABNORMAL LOW (ref 135–145)

## 2023-11-24 LAB — IRON AND TIBC
Iron: 53 ug/dL (ref 28–170)
Saturation Ratios: 12 % (ref 10.4–31.8)
TIBC: 433 ug/dL (ref 250–450)
UIBC: 380 ug/dL

## 2023-11-24 LAB — CK: Total CK: 39 U/L (ref 38–234)

## 2023-11-24 LAB — CBC
HCT: 30.9 % — ABNORMAL LOW (ref 36.0–46.0)
Hemoglobin: 9.7 g/dL — ABNORMAL LOW (ref 12.0–15.0)
MCH: 29.8 pg (ref 26.0–34.0)
MCHC: 31.4 g/dL (ref 30.0–36.0)
MCV: 94.8 fL (ref 80.0–100.0)
Platelets: 166 K/uL (ref 150–400)
RBC: 3.26 MIL/uL — ABNORMAL LOW (ref 3.87–5.11)
RDW: 15.4 % (ref 11.5–15.5)
WBC: 11.8 K/uL — ABNORMAL HIGH (ref 4.0–10.5)
nRBC: 0 % (ref 0.0–0.2)

## 2023-11-24 LAB — RESP PANEL BY RT-PCR (RSV, FLU A&B, COVID)  RVPGX2
Influenza A by PCR: NEGATIVE
Influenza B by PCR: NEGATIVE
Resp Syncytial Virus by PCR: NEGATIVE
SARS Coronavirus 2 by RT PCR: NEGATIVE

## 2023-11-24 LAB — RETICULOCYTES
Immature Retic Fract: 19.7 % — ABNORMAL HIGH (ref 2.3–15.9)
RBC.: 3.22 MIL/uL — ABNORMAL LOW (ref 3.87–5.11)
Retic Count, Absolute: 110.4 K/uL (ref 19.0–186.0)
Retic Ct Pct: 3.4 % — ABNORMAL HIGH (ref 0.4–3.1)

## 2023-11-24 LAB — FERRITIN: Ferritin: 32 ng/mL (ref 11–307)

## 2023-11-24 LAB — ECHOCARDIOGRAM COMPLETE
Height: 66 in
Weight: 5329.84 [oz_av]

## 2023-11-24 LAB — GLUCOSE, CAPILLARY
Glucose-Capillary: 112 mg/dL — ABNORMAL HIGH (ref 70–99)
Glucose-Capillary: 166 mg/dL — ABNORMAL HIGH (ref 70–99)

## 2023-11-24 LAB — FOLATE: Folate: 14.4 ng/mL (ref >5.9)

## 2023-11-24 LAB — VITAMIN B12: Vitamin B-12: 696 pg/mL (ref 180–914)

## 2023-11-24 LAB — BRAIN NATRIURETIC PEPTIDE: B Natriuretic Peptide: 365.5 pg/mL — ABNORMAL HIGH (ref 0.0–100.0)

## 2023-11-24 LAB — TSH: TSH: 4.57 u[IU]/mL — ABNORMAL HIGH (ref 0.350–4.500)

## 2023-11-24 MED ORDER — OLANZAPINE 10 MG PO TABS
10.0000 mg | ORAL_TABLET | Freq: Every day | ORAL | Status: DC
  Administered 2023-11-24 – 2023-11-25 (×2): 10 mg via ORAL
  Filled 2023-11-24 (×3): qty 1

## 2023-11-24 MED ORDER — ALBUTEROL SULFATE (2.5 MG/3ML) 0.083% IN NEBU: 2.5000 mg | INHALATION_SOLUTION | RESPIRATORY_TRACT | Status: DC | PRN

## 2023-11-24 MED ORDER — APIXABAN 2.5 MG PO TABS
2.5000 mg | ORAL_TABLET | Freq: Two times a day (BID) | ORAL | Status: DC
  Administered 2023-11-24 – 2023-11-26 (×5): 2.5 mg via ORAL
  Filled 2023-11-24 (×5): qty 1

## 2023-11-24 MED ORDER — DIVALPROEX SODIUM 250 MG PO DR TAB
250.0000 mg | DELAYED_RELEASE_TABLET | Freq: Two times a day (BID) | ORAL | Status: DC
  Administered 2023-11-24 – 2023-11-26 (×4): 250 mg via ORAL
  Filled 2023-11-24 (×4): qty 1

## 2023-11-24 MED ORDER — ALBUTEROL SULFATE HFA 108 (90 BASE) MCG/ACT IN AERS: 2.0000 | INHALATION_SPRAY | Freq: Four times a day (QID) | RESPIRATORY_TRACT | Status: DC | PRN

## 2023-11-24 MED ORDER — FUROSEMIDE 10 MG/ML IJ SOLN
40.0000 mg | Freq: Once | INTRAMUSCULAR | Status: AC
  Administered 2023-11-24: 40 mg via INTRAVENOUS
  Filled 2023-11-24: qty 4

## 2023-11-24 MED ORDER — SODIUM BICARBONATE 650 MG PO TABS
650.0000 mg | ORAL_TABLET | Freq: Three times a day (TID) | ORAL | Status: DC
  Administered 2023-11-24 – 2023-11-26 (×7): 650 mg via ORAL
  Filled 2023-11-24 (×7): qty 1

## 2023-11-24 MED ORDER — METOPROLOL TARTRATE 100 MG PO TABS
100.0000 mg | ORAL_TABLET | Freq: Three times a day (TID) | ORAL | Status: DC
  Administered 2023-11-24 – 2023-11-26 (×7): 100 mg via ORAL
  Filled 2023-11-24 (×7): qty 1

## 2023-11-24 MED ORDER — AMLODIPINE BESYLATE 10 MG PO TABS
10.0000 mg | ORAL_TABLET | Freq: Every day | ORAL | Status: DC
  Administered 2023-11-24 – 2023-11-25 (×2): 10 mg via ORAL
  Filled 2023-11-24 (×2): qty 1

## 2023-11-24 MED ORDER — BUDESON-GLYCOPYRROL-FORMOTEROL 160-9-4.8 MCG/ACT IN AERO
2.0000 | INHALATION_SPRAY | Freq: Two times a day (BID) | RESPIRATORY_TRACT | Status: DC
  Administered 2023-11-24 – 2023-11-26 (×4): 2 via RESPIRATORY_TRACT
  Filled 2023-11-24: qty 5.9

## 2023-11-24 MED ORDER — ACETAMINOPHEN 500 MG PO TABS
1000.0000 mg | ORAL_TABLET | Freq: Four times a day (QID) | ORAL | Status: DC | PRN
  Administered 2023-11-24: 1000 mg via ORAL
  Filled 2023-11-24: qty 2

## 2023-11-24 MED ORDER — VITAMIN D 25 MCG (1000 UNIT) PO TABS
1000.0000 [IU] | ORAL_TABLET | Freq: Every day | ORAL | Status: DC
  Administered 2023-11-25 – 2023-11-26 (×2): 1000 [IU] via ORAL
  Filled 2023-11-24 (×2): qty 1

## 2023-11-24 MED ORDER — PRAVASTATIN SODIUM 40 MG PO TABS
40.0000 mg | ORAL_TABLET | Freq: Every evening | ORAL | Status: DC
  Administered 2023-11-24 – 2023-11-26 (×3): 40 mg via ORAL
  Filled 2023-11-24 (×3): qty 1

## 2023-11-24 MED ORDER — CHLORHEXIDINE GLUCONATE CLOTH 2 % EX PADS
6.0000 | MEDICATED_PAD | Freq: Every day | CUTANEOUS | Status: DC
  Administered 2023-11-24 – 2023-11-26 (×3): 6 via TOPICAL

## 2023-11-24 MED ORDER — ALLOPURINOL 100 MG PO TABS
50.0000 mg | ORAL_TABLET | ORAL | Status: DC
  Administered 2023-11-24 – 2023-11-26 (×2): 50 mg via ORAL
  Filled 2023-11-24 (×2): qty 1

## 2023-11-24 NOTE — Progress Notes (Signed)
 Echocardiogram 2D Echocardiogram has been performed.  Abigail Wallace 11/24/2023, 2:45 PM

## 2023-11-24 NOTE — Assessment & Plan Note (Signed)
 Patient with chronic indwelling Foley.  UA collected from catheter upon arrival in the ED with nitrites and leukocytes.  Patient afebrile with mild leukocytosis 11.8 on admission. - Remove current Foley, obtain I/O cath urine sample for UA, then place new catheter - Consider antibiotics pending repeat UA

## 2023-11-24 NOTE — ED Provider Notes (Signed)
 [place] EMERGENCY DEPARTMENT AT [facility] Provider Note   CSN: [id number] Arrival date & time: 11/24/23  0546     Patient presents with: Shortness of Breath   Abigail Wallace [facility] is a 78 y.o. female.   The history is provided by the patient.  Patient with extensive history including morbid obesity, schizophrenia, sick sinus syndrome with pacemaker, hypertension, A-fib, on anticoagulation, chronic kidney disease presents for a multitude of issues.  She reports she been feeling short of breath for the past 2 weeks and is having chapped lips.  She also reports generalized weakness and full body pain. Denies any recent falls or trauma. Patient is requesting that she talk to her son on the phone    Past Medical History:  Diagnosis Date   Abdominal wall hernia 01/2017   Advanced care planning/counseling discussion 09/29/2021   Atrial fibrillation (HCC)    CHF (congestive heart failure) (HCC)    CKD (chronic kidney disease)    COPD (chronic obstructive pulmonary disease) (HCC)    Coronary artery disease    NON-CRITICAL   Ectasis aorta    GERD (gastroesophageal reflux disease)    Gout    History of Sundowning 06/15/2023   Hypertension    Memory difficulties 12/02/2013   Pacemaker    Paranoid schizophrenia (HCC)    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         Tardive dyskinesia 04/26/2011   Type 2 diabetes mellitus (HCC)     Prior to Admission medications   Medication Sig Start Date End Date Taking? Authorizing Provider  acetaminophen  (TYLENOL ) 500 MG tablet Take 500 mg by mouth every 6 (six) hours as needed for mild pain (pain score 1-3) or headache.    [provider]  albuterol  (VENTOLIN  HFA) 108 (90 Base) MCG/ACT inhaler Inhale 2 puffs into the lungs every 6 (six) hours as needed for wheezing or shortness of breath. 11/11/23   Delores Suzann HERO, MD  allopurinol  (ZYLOPRIM )  100 MG tablet Take 0.5 tablets (50 mg total) by mouth every other day. 09/19/23   Delores Suzann HERO, MD  amLODipine  (NORVASC ) 10 MG tablet Take 1 tablet (10 mg total) by mouth at bedtime. 10/11/23   Delores Suzann HERO, MD  apixaban  (ELIQUIS ) 2.5 MG TABS tablet Take 1 tablet (2.5 mg total) by mouth 2 (two) times daily. 12/11/22   Delores Suzann HERO, MD  cholecalciferol  (VITAMIN D3) 25 MCG (1000 UNIT) tablet Take 1 tablet (1,000 Units total) by mouth daily. 08/12/23   Rumball, Alison M, DO  diclofenac  Sodium (VOLTAREN ) 1 % GEL Apply 2 g topically 4 (four) times daily as needed (pain). 08/19/23   Delores Suzann HERO, MD  divalproex  (DEPAKOTE ) 250 MG DR tablet Take 1 tablet (250 mg total) by mouth every 12 (twelve) hours. 03/05/22   Samtani, Jai-Gurmukh, MD  Fluticasone -Umeclidin-Vilant (TRELEGY ELLIPTA ) 100-62.5-25 MCG/ACT AEPB Inhale 1 puff into the lungs daily. 11/11/23   Delores Suzann HERO, MD  furosemide  (LASIX ) 40 MG tablet Take 1.5 tablets (60 mg total) by mouth every other day. 09/28/23 09/27/24  Janna Ferrier, DO  glucose blood (ACCU-CHEK GUIDE TEST) test strip Use to check blood glucose once daily and as needed for symptoms 11/08/23   Delores Suzann HERO, MD  ipratropium-albuterol  (DUONEB) 0.5-2.5 (3) MG/3ML SOLN Take 3 mLs by nebulization every 4 (four) hours as needed. 11/11/23   Delores Suzann HERO, MD  Lancets (FREESTYLE) lancets  Use to check blood glucose once daily and as needed for symptoms 11/08/23   Delores Suzann HERO, MD  metoprolol  tartrate (LOPRESSOR ) 100 MG tablet Take 1 tablet (100 mg total) by mouth 3 (three) times daily. 09/04/23   Delores Suzann HERO, MD  Multiple Vitamins-Minerals (MULTIVITAMIN WOMEN 50+) TABS Take 1 tablet by mouth daily.    [provider]  nitroGLYCERIN  (NITROSTAT ) 0.4 MG SL tablet Place 1 tablet (0.4 mg total) under the tongue every 5 (five) minutes as needed for chest pain. 08/03/22 01/15/24  Croitoru, Mihai, MD  nystatin  cream (MYCOSTATIN ) Apply 1 Application topically daily as needed  (Rash). 11/21/23   Delores Suzann HERO, MD  nystatin  powder Apply 1 Application topically daily. Apply after bathing. 10/04/23   Delores Suzann HERO, MD  OLANZapine  (ZYPREXA ) 10 MG tablet Take 10 mg by mouth at bedtime. 05/17/23   [provider]  OVER THE COUNTER MEDICATION Apply 1 application  topically See admin instructions. Caldesene Medicated Protecting Powder- Apply under the breasts and between the skin folds 2 times a day as needed for irritation    [provider]  polyethylene glycol powder (GLYCOLAX /MIRALAX ) 17 GM/SCOOP powder Take 17 g by mouth 3 (three) times daily. 12/11/22   Delores Suzann HERO, MD  pravastatin  (PRAVACHOL ) 40 MG tablet Take 1 tablet (40 mg total) by mouth every evening. Patient taking differently: Take 40 mg by mouth at bedtime. 09/25/23   Croitoru, Mihai, MD  Semaglutide ,0.25 or 0.5MG /DOS, 2 MG/1.5ML SOPN Inject 0.25 mg into the skin once a week. 10/11/23   Delores Suzann HERO, MD  senna (SENOKOT) 8.6 MG TABS tablet Take 1 tablet (8.6 mg total) by mouth daily as needed for mild constipation. Patient taking differently: Take 1 tablet by mouth daily. 05/16/23   Delores Suzann HERO, MD  sodium bicarbonate  650 MG tablet Take 1 tablet (650 mg total) by mouth 3 (three) times daily. 09/25/23   Delores Suzann HERO, MD  triamcinolone  ointment (KENALOG ) 0.5 % Apply 1 Application topically 2 (two) times daily as needed (skin irritation). 10/04/23   Delores Suzann HERO, MD    Allergies: Abilify [aripiprazole], Keflex  Catheryn ], Versacloz  [clozapine ], Ativan  [lorazepam ], Benadryl [diphenhydramine hcl], Benztropine , Latuda [lurasidone hcl], Remeron [mirtazapine], Haldol  [haloperidol  lactate], Codeine, and Latex    Review of Systems  Respiratory:  Positive for shortness of breath.   Musculoskeletal:  Positive for myalgias.    Updated Vital Signs BP (!) 162/72 (BP Location: Right Arm)   Pulse 72   Temp 97.6 F (36.4 C) (Oral)   Resp 20   Wt 125.7 kg   SpO2 100%   BMI 44.73 kg/m    Physical Exam CONSTITUTIONAL: Chronically ill-appearing, lying on her left side HEAD: Normocephalic/atraumatic EYES: EOMI/PERRL ENMT: Mucous membranes moist NECK: supple no meningeal signs SPINE/BACK:entire spine nontender CV: S1/S2 noted, distant heart sounds noted LUNGS: Currently on 2 L nasal cannula.  No tachypnea.  No crackles or wheeze, but limited due to body size ABDOMEN: soft, obese GU: Catheter in place on arrival NEURO: Pt is awake/alert/appropriate, moves all extremitiesx4.   EXTREMITIES: pulses normal/equal, full ROM No obvious deformities to her extremities No asymmetric edema or erythema noted SKIN: warm, color normal  (all labs ordered are listed, but only abnormal results are displayed) Labs Reviewed  RESP PANEL BY RT-PCR (RSV, FLU A&B, COVID)  [id number]  BASIC METABOLIC PANEL WITH GFR  CBC  BRAIN NATRIURETIC PEPTIDE  CK  URINALYSIS, ROUTINE W REFLEX MICROSCOPIC    EKG: EKG Interpretation Date/Time:  Sunday November 24 2023 05:56:45 EST Ventricular Rate:  66 PR Interval:  228 QRS Duration:  176 QT Interval:  475 QTC Calculation: 498 R Axis:   -81  Text Interpretation: Sinus or ectopic atrial rhythm Prolonged PR interval Nonspecific IVCD with LAD LVH with secondary repolarization abnormality Confirmed by Midge Golas ([id number]) on 11/24/2023 6:04:05 AM  Radiology: No results found.   Procedures   Medications Ordered in the ED - No data to display  Clinical Course as of 11/24/23 0654  [place] Nov 24, 2023  0625 Patient with extensive medical history presenting with multiple complaints reported she just does not feel well.  I spoke to her son via the phone.  He reports that he just had a heart attackand does not feel safe coming to the hospital He reports recently she has been reporting increasing shortness of breath and diffuse body pain.  She is also reported burning around her catheter.  Last Foley changes around 3 weeks ago. He reports due to her  underlying mental health conditions, it is difficult to determine if there are any new medical issues.  No recent falls or trauma.  She is not on antibiotics at this time.  She takes daily Eliquis  [DW]  0654 Signed out to dr laurice with labs pending [DW]    Clinical Course User Index [DW] Midge Golas, MD                                 Medical Decision Making Amount and/or Complexity of Data Reviewed Labs: ordered. Radiology: ordered.   This patient presents to the ED for concern of shortness of breath, this involves an extensive number of treatment options, and is a complaint that carries with it a high risk of complications and morbidity.  The differential diagnosis includes but is not limited to Acute coronary syndrome, pneumonia, acute pulmonary edema, pneumothorax, acute anemia, pulmonary embolism   Comorbidities that complicate the patient evaluation: Patient's presentation is complicated by their history of obesity, schizophrenia  Social Determinants of Health: Patient's poor mobility  increases the complexity of managing their presentation  Additional history obtained: Additional history obtained from family Records reviewed previous admission documents  Lab Tests: I Ordered, and personally interpreted labs.  The pertinent results include: Leukocytosis  Imaging Studies ordered: I ordered imaging studies including X-ray chest  I independently visualized and interpreted imaging which showed vascular congestion I agree with the radiologist interpretation  Cardiac Monitoring: The patient was maintained on a cardiac monitor.  I personally viewed and interpreted the cardiac monitor which showed an underlying rhythm of:  sinus rhythm  Complexity of problems addressed: Patient's presentation is most consistent with  acute presentation with potential threat to life or bodily function       Final diagnoses:  None    ED Discharge Orders     None           Midge Golas, MD 11/24/23 [phone]

## 2023-11-24 NOTE — ED Notes (Signed)
Pt placed on bed pan with 2 staff assist

## 2023-11-24 NOTE — Assessment & Plan Note (Signed)
 Differentials as above.  Most likely component of acute on chronic heart failure. - Pending Echo result  - Oxygen  supplementation as needed to maintain Goal of greater than 88% due to hx of COPD. wean as tolerated - Strict I's/O - Daily weights - Vitals per floor - AM BMP, CBC

## 2023-11-24 NOTE — ED Notes (Signed)
 Pt assisted with the phone to call her son

## 2023-11-24 NOTE — Assessment & Plan Note (Signed)
 Hemoglobin 9.7 on arrival.  Baseline appears around 10-11. - Anemia panel - AM CBC

## 2023-11-24 NOTE — Plan of Care (Signed)
   Problem: Education: Goal: Knowledge of General Education information will improve Description Including pain rating scale, medication(s)/side effects and non-pharmacologic comfort measures Outcome: Progressing   Problem: Health Behavior/Discharge Planning: Goal: Ability to manage health-related needs will improve Outcome: Progressing

## 2023-11-24 NOTE — Assessment & Plan Note (Signed)
 UA collected from catheter upon arrival in the ED with nitrites and leukocytes w/ repeated UA after replaced foley (-) for nitrite and leukocyte.  Patient afebrile with mild leukocytosis 11.8 on admission. - No ABX need at this time

## 2023-11-24 NOTE — H&P (Addendum)
 Hospital Admission History and Physical Service Pager: [phone]  Patient name: Abigail Wallace [facility] Medical record number: [id number] Date of Birth: 08-03-1945 Age: 78 y.o. Gender: female  Primary Care Provider: Delores Suzann HERO, MD Consultants: None Code Status: Full which was confirmed by patient   Preferred Emergency Contact:  Contact Information     Name Relation Home Work Abigail Wallace Son [phone]  [phone]      Other Contacts     Name Relation Home Work Mobile   Abigail Wallace,Abigail Wallace Son [phone]  [phone]      Chief Complaint: Dyspnea  Differential and Medical Decision Making:  Abigail Wallace [facility] is a 78 y.o. female presenting with dyspnea.  Differential diagnoses include the following:  ADHF: Patient with CHF, mildly elevated BNP on arrival, signs of fluid overload on exam. COPD exacerbation: No reported worsening of cough or sputum change.  No wheezing appreciated. Viral URI: Rapid Quad screen negative.  Patient afebrile with mild leukocytosis 11.8.  Assessment & Plan Dyspnea Acute on chronic heart failure (HCC) Differentials as above.  Most likely component of acute on chronic heart failure. - Admit to FMTS, attending Dr. Anders, telemetry floor - IV Lasix  40 x 1 - Echocardiogram ordered - Oxygen  supplementation as needed, wean as tolerated -Strict I's/O - Daily weights - Vitals per floor - AM BMP, CBC Anemia Hemoglobin 9.7 on arrival.  Baseline appears around 10-11. - Anemia panel - AM CBC Well controlled type 2 diabetes mellitus (HCC) Patient with well-controlled T2DM.  Not on insulin  at home.  Patient's son adamant about patient not receiving insulin  during this admission at this time. - CBG qACHS - If blood sugars become uncontrolled, will plan to reassess insulin  needs with patient's son Chronic indwelling Foley catheter Patient with chronic indwelling Foley.  UA collected from catheter upon arrival in the ED with  nitrites and leukocytes.  Patient afebrile with mild leukocytosis 11.8 on admission. - Remove current Foley, obtain I/O cath urine sample for UA, then place new catheter - Consider antibiotics pending repeat UA   Chronic and Stable Conditions: HTN: Continue home amlodipine  10 daily, metoprolol  100 3 times daily CKD: Continue home bicarb 650 3 times daily COPD: Breztri  daily (home equivalent of Trelegy), albuterol  as needed HLD: Continue home pravastatin  40 daily Schizophrenia: Continue home Depakote  250 twice daily, olanzapine  10 nightly Afib, sinus sick syndrome s/p pacemaker: Continue home Eliquis  2.5 twice daily, metoprolol  100 3 times daily  FEN/GI: Heart diet VTE Prophylaxis: Home Eliquis   Disposition: Telemetry  History of Present Illness:  Abigail Wallace is a 78 y.o. female presenting with dyspnea over the past week.  Patient reports general feelings of fatigue and difficulty breathing over the past week.  She reports using oxygen  as needed at home up to 2 L, does not need to use it every day. She is also having right upper leg pain.  No recent falls or trauma.  Per patient's son who was contacted over the phone, patient has been up to date on her medications.  She last took her Lasix  on Friday, which she normally takes 60 mg every other day.  Patient's son is adamant about not having patient on insulin  for diabetes while she is admitted.  In the ED, patient arrived afebrile with mild leukocytosis 11.8, satting well on 2L O2.  BNP mildly elevated 365. Chest x-ray with pulmonary congestion, no focal consolidation.  EKG without acute ischemia. Rapid quad screen negative.    Review  Of Systems: Per HPI  Pertinent Past Medical History: A-fib CHF CKD 3B COPD CAD Phthisis aorta GERD Gout HTN Memory difficulties Pacemaker Paranoid schizophrenia Sick sinus syndrome, s/p pacemaker Tardive dyskinesia T2DM  Pertinent Past Surgical History: Cardioversion 2023 Cardiac  catheterization 2007 Pacemaker placement 2007 Cholecystectomy Remainder reviewed in history tab.   Pertinent Social History: Tobacco use: Former, quit in 2024, smoked on and off for 60+ years Alcohol  use: None Other Substance use: None Lives with son Abigail Wallace  Pertinent Family History: Mother: Heart disease Father: Heart disease Sister: Stroke Son: T2DM.   Important Outpatient Medications: Albuterol  as needed Allopurinol  50 mg every other day Amlodipine  10 mg daily Eliquis  2.5 mg twice daily Depakote  250 mg twice daily Trelegy 1 puff daily Lasix  60 daily Metoprolol  tartrate 100 3 times daily Olanzapine  10 mg nightly Pravastatin  40 daily Ozempic  0.5 mg weekly Sodium bicarb 650 mg 3 times daily   Objective: BP (!) 160/55   Pulse 70   Temp (!) 97.4 F (36.3 C) (Oral)   Resp 15   Ht 5' 6 (1.676 m)   Wt 125.7 kg   SpO2 100%   BMI 44.73 kg/m  Exam: General: No acute distress.  Resting comfortably in room. CV: Normal S1/S2. No extra heart sounds. Warm and well-perfused. Pulm: Breathing comfortably 2.5 L O2.  Clear breath sounds of upper lobes.  Bibasilar crackles. No increased WOB. Abd: Soft, non-distended. Skin:  Warm, dry.  Ext: 1+ pitting edema of bilateral ankles.  Labs:  CBC BMET  Recent Labs  Lab 11/24/23 0558  WBC 11.8*  HGB 9.7*  HCT 30.9*  PLT 166   Recent Labs  Lab 11/24/23 0558  NA 131*  K 4.8  CL 101  CO2 15*  BUN 41*  CREATININE 2.95*  GLUCOSE 156*  CALCIUM  9.2     BNP: 365.5 CK: 39  EKG: No acute ischemic findings   Imaging Studies Performed: CXR: Diffuse pulmonary vascular congestion.   Abigail Perkins, MD 11/24/2023, 10:13 AM PGY-2, [facility]  FPTS Intern pager: [phone], text pages welcome Secure chat group [facility] Teaching Service

## 2023-11-24 NOTE — Assessment & Plan Note (Signed)
 Differentials as above.  Most likely component of acute on chronic heart failure. - Admit to FMTS, attending Dr. Anders, telemetry floor - IV Lasix  40 x 1 - Echocardiogram ordered - Oxygen  supplementation as needed, wean as tolerated -Strict I's/O - Daily weights - Vitals per floor - AM BMP, CBC

## 2023-11-24 NOTE — H&P (View-Only) (Signed)
 Hospital Admission History and Physical Service Pager: [phone]  Patient name: Abigail Wallace [facility] Medical record number: [id number] Date of Birth: 08-03-1945 Age: 78 y.o. Gender: female  Primary Care Provider: Delores Suzann HERO, MD Consultants: None Code Status: Full which was confirmed by patient   Preferred Emergency Contact:  Contact Information     Name Relation Home Work [place] Son [phone]  [phone]      Other Contacts     Name Relation Home Work Mobile   Abigail Wallace Son [phone]  [phone]      Chief Complaint: Dyspnea  Differential and Medical Decision Making:  Abigail Wallace [facility] is a 78 y.o. female presenting with dyspnea.  Differential diagnoses include the following:  ADHF: Patient with CHF, mildly elevated BNP on arrival, signs of fluid overload on exam. COPD exacerbation: No reported worsening of cough or sputum change.  No wheezing appreciated. Viral URI: Rapid Quad screen negative.  Patient afebrile with mild leukocytosis 11.8.  Assessment & Plan Dyspnea Acute on chronic heart failure (HCC) Differentials as above.  Most likely component of acute on chronic heart failure. - Admit to FMTS, attending Dr. Anders, telemetry floor - IV Lasix  40 x 1 - Echocardiogram ordered - Oxygen  supplementation as needed, wean as tolerated -Strict I's/O - Daily weights - Vitals per floor - AM BMP, CBC Anemia Hemoglobin 9.7 on arrival.  Baseline appears around 10-11. - Anemia panel - AM CBC Well controlled type 2 diabetes mellitus (HCC) Patient with well-controlled T2DM.  Not on insulin  at home.  Patient's son adamant about patient not receiving insulin  during this admission at this time. - CBG qACHS - If blood sugars become uncontrolled, will plan to reassess insulin  needs with patient's son Chronic indwelling Foley catheter Patient with chronic indwelling Foley.  UA collected from catheter upon arrival in the ED with  nitrites and leukocytes.  Patient afebrile with mild leukocytosis 11.8 on admission. - Remove current Foley, obtain I/O cath urine sample for UA, then place new catheter - Consider antibiotics pending repeat UA   Chronic and Stable Conditions: HTN: Continue home amlodipine  10 daily, metoprolol  100 3 times daily CKD: Continue home bicarb 650 3 times daily COPD: Breztri  daily (home equivalent of Trelegy), albuterol  as needed HLD: Continue home pravastatin  40 daily Schizophrenia: Continue home Depakote  250 twice daily, olanzapine  10 nightly Afib, sinus sick syndrome s/p pacemaker: Continue home Eliquis  2.5 twice daily, metoprolol  100 3 times daily  FEN/GI: Heart diet VTE Prophylaxis: Home Eliquis   Disposition: Telemetry  History of Present Illness:  Abigail Wallace is a 78 y.o. female presenting with dyspnea over the past week.  Patient reports general feelings of fatigue and difficulty breathing over the past week.  She reports using oxygen  as needed at home up to 2 L, does not need to use it every day. She is also having right upper leg pain.  No recent falls or trauma.  Per patient's son who was contacted over the phone, patient has been up to date on her medications.  She last took her Lasix  on Friday, which she normally takes 60 mg every other day.  Patient's son is adamant about not having patient on insulin  for diabetes while she is admitted.  In the ED, patient arrived afebrile with mild leukocytosis 11.8, satting well on 2L O2.  BNP mildly elevated 365. Chest x-ray with pulmonary congestion, no focal consolidation.  EKG without acute ischemia. Rapid quad screen negative.    Review  Of Systems: Per HPI  Pertinent Past Medical History: A-fib CHF CKD 3B COPD CAD Phthisis aorta GERD Gout HTN Memory difficulties Pacemaker Paranoid schizophrenia Sick sinus syndrome, s/p pacemaker Tardive dyskinesia T2DM  Pertinent Past Surgical History: Cardioversion 2023 Cardiac  catheterization 2007 Pacemaker placement 2007 Cholecystectomy Remainder reviewed in history tab.   Pertinent Social History: Tobacco use: Former, quit in 2024, smoked on and off for 60+ years Alcohol  use: None Other Substance use: None Lives with son Abigail Wallace  Pertinent Family History: Mother: Heart disease Father: Heart disease Sister: Stroke Son: T2DM.   Important Outpatient Medications: Albuterol  as needed Allopurinol  50 mg every other day Amlodipine  10 mg daily Eliquis  2.5 mg twice daily Depakote  250 mg twice daily Trelegy 1 puff daily Lasix  60 daily Metoprolol  tartrate 100 3 times daily Olanzapine  10 mg nightly Pravastatin  40 daily Ozempic  0.5 mg weekly Sodium bicarb 650 mg 3 times daily   Objective: BP (!) 160/55   Pulse 70   Temp (!) 97.4 F (36.3 C) (Oral)   Resp 15   Ht 5' 6 (1.676 m)   Wt 125.7 kg   SpO2 100%   BMI 44.73 kg/m  Exam: General: No acute distress.  Resting comfortably in room. CV: Normal S1/S2. No extra heart sounds. Warm and well-perfused. Pulm: Breathing comfortably 2.5 L O2.  Clear breath sounds of upper lobes.  Bibasilar crackles. No increased WOB. Abd: Soft, non-distended. Skin:  Warm, dry.  Ext: 1+ pitting edema of bilateral ankles.  Labs:  CBC BMET  Recent Labs  Lab 11/24/23 0558  WBC 11.8*  HGB 9.7*  HCT 30.9*  PLT 166   Recent Labs  Lab 11/24/23 0558  NA 131*  K 4.8  CL 101  CO2 15*  BUN 41*  CREATININE 2.95*  GLUCOSE 156*  CALCIUM  9.2     BNP: 365.5 CK: 39  EKG: No acute ischemic findings   Imaging Studies Performed: CXR: Diffuse pulmonary vascular congestion.   Abigail Perkins, MD 11/24/2023, 10:13 AM PGY-2, [facility]  FPTS Intern pager: [phone], text pages welcome Secure chat group [facility] Teaching Service

## 2023-11-24 NOTE — ED Provider Notes (Signed)
 Patient seen after prior ED provider.  Family medicine teaching team is aware of case.  They will evaluate for admission.   Laurice Maude BROCKS, MD 11/24/23 [phone]

## 2023-11-24 NOTE — ED Triage Notes (Addendum)
 BIB [facility] from home for SOB, malaise for the past 2 weeks and chapped lips.  Patient on 2L [place] at home.  Patient able to speak full sentences without difficulty.   Hx of paranoid schizophrenia

## 2023-11-24 NOTE — Assessment & Plan Note (Signed)
 Patient with well-controlled T2DM.  Not on insulin  at home.  Patient's son adamant about patient not receiving insulin  during this admission at this time. - CBG qACHS - If blood sugars become uncontrolled, will plan to reassess insulin  needs with patient's son

## 2023-11-24 NOTE — Progress Notes (Deleted)
 FMTS Brief Progress Note   S: Ms. Ariannie Penaloza is a 78 y.o. female w/ PMH of HTN, CKD, COPD, HLD, Schizophrenia, A-fib ( on Eliquis ) presenting with dyspnea over the past week. Patient is admitted for SOB w/ increase oxygen  requirement. She rest comfortable in bed w/o pain. 97% Sat. Currently on 2L [place]    O: BP (!) 134/42 (BP Location: Right Wrist)   Pulse 70   Temp 98.5 F (36.9 C) (Oral)   Resp 19   Ht 5' 6 (1.676 m)   Wt (!) 151.1 kg   SpO2 97%   BMI 53.77 kg/m     Physical Exam Cardiovascular:     Rate and Rhythm: Normal rate.  Pulmonary:     Effort: Pulmonary effort is normal.     Breath sounds: Normal breath sounds.  Abdominal:     Palpations: Abdomen is soft.  Neurological:     General: No focal deficit present.     Mental Status: She is oriented to person, place, and time.     Ms. Glenyce Randle is a 78 y.o. female patient has been stable w/ no acute distress received Lasix  40 mg IV once since admission   Assessment & Plan Dyspnea Acute on chronic heart failure (HCC) Differentials as above.  Most likely component of acute on chronic heart failure. - Pending Echo result  - Oxygen  supplementation as needed to maintain Goal of greater than 88% due to hx of COPD. wean as tolerated - Strict I's/O - Daily weights - Vitals per floor - AM BMP, CBC Chronic indwelling Foley catheter UA collected from catheter upon arrival in the ED with nitrites and leukocytes w/ repeated UA after replaced foley (-) for nitrite and leukocyte.  Patient afebrile with mild leukocytosis 11.8 on admission. - No ABX need at this time     Suzen Houston NOVAK, DO 11/24/2023, 8:47 PM PGY-1, [facility] Family Medicine Night Resident  Please page [phone] with questions.

## 2023-11-24 NOTE — ED Notes (Signed)
 This RN updated patients son [name]. Son reports he will be coming up here this evening to see her.

## 2023-11-25 ENCOUNTER — Inpatient Hospital Stay (HOSPITAL_COMMUNITY)

## 2023-11-25 DIAGNOSIS — M545 Low back pain, unspecified: Secondary | ICD-10-CM | POA: Diagnosis not present

## 2023-11-25 DIAGNOSIS — R0600 Dyspnea, unspecified: Secondary | ICD-10-CM | POA: Diagnosis not present

## 2023-11-25 DIAGNOSIS — Z960 Presence of urogenital implants: Secondary | ICD-10-CM

## 2023-11-25 LAB — CBC
HCT: 29.5 % — ABNORMAL LOW (ref 36.0–46.0)
Hemoglobin: 9.3 g/dL — ABNORMAL LOW (ref 12.0–15.0)
MCH: 29.6 pg (ref 26.0–34.0)
MCHC: 31.5 g/dL (ref 30.0–36.0)
MCV: 93.9 fL (ref 80.0–100.0)
Platelets: 173 K/uL (ref 150–400)
RBC: 3.14 MIL/uL — ABNORMAL LOW (ref 3.87–5.11)
RDW: 15.4 % (ref 11.5–15.5)
WBC: 11.3 K/uL — ABNORMAL HIGH (ref 4.0–10.5)
nRBC: 0 % (ref 0.0–0.2)

## 2023-11-25 LAB — BASIC METABOLIC PANEL WITH GFR
Anion gap: 11 (ref 5–15)
BUN: 46 mg/dL — ABNORMAL HIGH (ref 8–23)
CO2: 19 mmol/L — ABNORMAL LOW (ref 22–32)
Calcium: 9.2 mg/dL (ref 8.9–10.3)
Chloride: 108 mmol/L (ref 98–111)
Creatinine, Ser: 3.41 mg/dL — ABNORMAL HIGH (ref 0.44–1.00)
GFR, Estimated: 13 mL/min — ABNORMAL LOW (ref >60)
Glucose, Bld: 140 mg/dL — ABNORMAL HIGH (ref 70–99)
Potassium: 4.3 mmol/L (ref 3.5–5.1)
Sodium: 138 mmol/L (ref 135–145)

## 2023-11-25 LAB — GLUCOSE, CAPILLARY
Glucose-Capillary: 144 mg/dL — ABNORMAL HIGH (ref 70–99)
Glucose-Capillary: 138 mg/dL — ABNORMAL HIGH (ref 70–99)
Glucose-Capillary: 126 mg/dL — ABNORMAL HIGH (ref 70–99)
Glucose-Capillary: 162 mg/dL — ABNORMAL HIGH (ref 70–99)

## 2023-11-25 LAB — T4, FREE: Free T4: 0.74 ng/dL (ref 0.61–1.12)

## 2023-11-25 MED ORDER — NYSTATIN 100000 UNIT/GM EX CREA
TOPICAL_CREAM | Freq: Two times a day (BID) | CUTANEOUS | Status: DC
  Filled 2023-11-25: qty 30

## 2023-11-25 NOTE — Assessment & Plan Note (Addendum)
 Foley replaced 11/9.  UA obtained prior to replacing cath without signs of UTI.  No indication for antibiotics (none prior given this hospitalization). No reports of vaginal discomfort on my exam, though per attending Dr. Anders pt was reporting this today. Consider candida vaginitis. - Will attempt GU exam - Starting patient on nystatin  cream for vulvar irritation - Continue to follow/monitor outpatient

## 2023-11-25 NOTE — Progress Notes (Addendum)
 EOS  Pt had used call bell stating to staff that she was having trouble breathing and couldn't breathe. Upon entering the room with another staff member, pt stated that she did not have trouble breathing and instead wanted staff to get her a blanket and rearrange the items on her table.  Pt also stated this shift that she smells smoke. Upon chart review, it appears that this may be a chronic issue for pt.   Pt describes frequent temperature fluctuations having hot and cold flashes, each lasting maybe a minute, asking for blankets, then needing them off.   Page sent out this shift requesting a bariatric bed. Order received. Waiting on bed.  Edit to add: pt also asked this RN to turn on a light because her son was in the room and wanted him to be able to see. Son was not in the room.

## 2023-11-25 NOTE — Plan of Care (Signed)
 FMTS Brief Progress Note   S: [name] is a 78 y.o. female w/ PMH of HTN, CKD, COPD, HLD, Schizophrenia, A-fib ( on Eliquis ) presenting with dyspnea over the past week. Patient is admitted for SOB w/ increase oxygen  requirement. She rest comfortable in bed w/o pain. 97% Sat. Currently on 2L [place]    O: BP (!) 134/42 (BP Location: Right Wrist)   Pulse 70   Temp 98.5 F (36.9 C) (Oral)   Resp 19   Ht 5' 6 (1.676 m)   Wt (!) 151.1 kg   SpO2 97%   BMI 53.77 kg/m     Physical Exam Cardiovascular:     Rate and Rhythm: Normal rate.  Pulmonary:     Effort: Pulmonary effort is normal.     Breath sounds: Normal breath sounds.  Abdominal:     Palpations: Abdomen is soft.  Neurological:     General: No focal deficit present.     Mental Status: She is oriented to person, place, and time.     [name] is a 78 y.o. female patient has been stable w/ no acute distress received Lasix  40 mg IV once since admission   Assessment & Plan Dyspnea Acute on chronic heart failure (HCC) Differentials as above.  Most likely component of acute on chronic heart failure. - Pending Echo result  - Oxygen  supplementation as needed to maintain Goal of greater than 88% due to hx of COPD. wean as tolerated - Strict I's/O - Daily weights - Vitals per floor - AM BMP, CBC Chronic indwelling Foley catheter UA collected from catheter upon arrival in the ED with nitrites and leukocytes w/ repeated UA after replaced foley (-) for nitrite and leukocyte.  Patient afebrile with mild leukocytosis 11.8 on admission. - No ABX need at this time     Suzen Houston NOVAK, DO 11/24/2023, 8:47 PM PGY-1, [facility] Family Medicine Night Resident  Please page [phone] with questions.

## 2023-11-25 NOTE — Progress Notes (Addendum)
 MD please call son Renay (after round)  [phone]    Updated son [name] via phone this AM Son reports that this is normal sundowning for pt - at home wants to be up and down every 5 minutes. Updated son that pt had intermittent bouts of sleep.

## 2023-11-25 NOTE — Assessment & Plan Note (Signed)
 Anemia: Stable Hgb of 9.3 from 9.7 yesterday T2DM: CBG qACHS; patient's son adamant that patient should not receive insulin  this admission, can reassess insulin  needs with patient's son as needed HTN: Continue home amlodipine  10 mg daily, metoprolol  100 mg TID CKD: Continue home bicarb 650 mg TID COPD: Breztri  daily (home equivalent of Trelegy), albuterol  as needed HLD: Continue home pravastatin  40 mg daily Schizophrenia: Continue home Depakote  250 mg twice daily, olanzapine  10 mg nightly Afib, sinus sick syndrome s/p pacemaker: Continue home Eliquis  2.5 mg twice daily, metoprolol  100 mg TID

## 2023-11-25 NOTE — Assessment & Plan Note (Addendum)
-   Restart home Lasix  60 mg every other day - O2 as needed, wean as tolerated - Strict I's/O's - Labs: AM BMP

## 2023-11-25 NOTE — Discharge Instructions (Addendum)
 Dear [name],   Thank you so much for allowing us  to be part of your care!  You were admitted to [facility] for difficulty breathing. We treated this with diuretics (medication that makes you pee a lot).  You also had some back and hip pain, and we got Xrays to investigate this. We found **  POST-HOSPITAL & CARE INSTRUCTIONS Use the nystatin  cream around your vagina to help with the discomfort. Please let PCP/Specialists know of any changes that were made.  Please see medications section of this packet for any medication changes.   DOCTOR'S APPOINTMENT & FOLLOW UP CARE INSTRUCTIONS  Future Appointments  Date Time Provider Department Center  11/29/2023  2:30 PM Elicia Hamlet, MD [facility]  12/30/2023  7:05 AM CVD HVT DEVICE REMOTES [facility]  03/30/2024  7:05 AM [facility] HVT DEVICE REMOTES [facility]  06/29/2024  7:05 AM [facility] HVT DEVICE REMOTES [facility]  09/28/2024  7:05 AM [facility] HVT DEVICE REMOTES [facility]  12/28/2024  7:05 AM CVD HVT DEVICE REMOTES [facility]    Take care and be well!  Family Medicine Teaching Service  [place]  [facility]  [address] [place], [place] [address] [phone]

## 2023-11-25 NOTE — Plan of Care (Signed)

## 2023-11-25 NOTE — Progress Notes (Signed)
 Daily Progress Note Intern Pager: [phone]  Patient name: Abigail Wallace [facility] Medical record number: [id number] Date of birth: 1945-09-30 Age: 78 y.o. Gender: female  Primary Care Provider: Delores Suzann HERO, MD Consultants: None Code Status: Full  Pt Overview and Major Events to Date:  - 11/9: Admitted  Medical Decision Making:  Tauna Macfarlane [facility] is a 78 y.o. female with PMH of CHF, SSS s/p PPM, A fib, CAD, HTN, HLD, T2DM, CPOD, CKD, schizophrenia, chronic indwelling Foley. Admitted for dyspnea likely 2/2 acute CHF exacerbation.  Out ~2.5L yesterday. Weights with discrepancy in documentation; weight of 143.9 kg last admission 09/2023. Weight of 154.2 kg today. Unable to get echocardiogram yesterday given patient would not tolerate.  Echo from earlier in the year 06/2023 with LVEF 55-60%. Home O2 needs of to 2L intermittently.  Patient doing well today on 2 L of O2 with nasal cannula not fully in nose, satting well, no dyspnea. Assessment & Plan Dyspnea (Resolved: 11/25/2023) Acute on chronic heart failure (HCC) - Restart home Lasix  60 mg every other day - O2 as needed, wean as tolerated - Strict I's/O's - Labs: AM BMP Chronic indwelling Foley catheter Foley replaced 11/9.  UA obtained prior to replacing cath without signs of UTI.  No indication for antibiotics (none prior given this hospitalization). No reports of vaginal discomfort on my exam, though per attending Dr. Anders pt was reporting this today. Consider candida vaginitis. - Will attempt GU exam - Starting patient on nystatin  cream for vulvar irritation - Continue to follow/monitor outpatient Chronic health problem Anemia: Stable Hgb of 9.3 from 9.7 yesterday T2DM: CBG qACHS; patient's son adamant that patient should not receive insulin  this admission, can reassess insulin  needs with patient's son as needed HTN: Continue home amlodipine  10 mg daily, metoprolol  100 mg TID CKD: Continue home bicarb 650 mg  TID COPD: Breztri  daily (home equivalent of Trelegy), albuterol  as needed HLD: Continue home pravastatin  40 mg daily Schizophrenia: Continue home Depakote  250 mg twice daily, olanzapine  10 mg nightly Afib, sinus sick syndrome s/p pacemaker: Continue home Eliquis  2.5 mg twice daily, metoprolol  100 mg TID  FEN/GI: Heart healthy PPx: Home Eliquis  2.5 mg BID Dispo: Home today.  Subjective:  Patient reports she is feeling better in terms of her breathing.  No other concerns today.  Is still on 2 L of O2 via [place], but this is not fully and her nose this morning and she is satting at 100%.  She does report an episode of rapid heart rate subjectively last night, on review of her monitor she was V-paced overnight with no tachy episodes noted.  Objective: Temp:  [97.4 F (36.3 C)-98.8 F (37.1 C)] 98.8 F (37.1 C) (11/10 0303) Pulse Rate:  [57-86] 57 (11/10 0303) Resp:  [15-21] 20 (11/10 0303) BP: (134-172)/(42-79) 142/58 (11/10 0303) SpO2:  [95 %-100 %] 95 % (11/10 0303) Weight:  [151.1 kg-154.2 kg] 154.2 kg (11/10 0500) Physical Exam: General: Patient lying on side in bed, no acute distress Cardiovascular: Heart sounds soft, but normal rate and rhythm. Respiratory: Normal work of breathing on 2 L O2 via [place] (cannula not fully in nostrils); lungs clear to auscultation bilaterally, some transmitted upper respiratory sounds. Abdomen: Abdomen: Bowel sounds present and normoactive bilaterally. Soft, nontender, protuberant Extremities: Skin warm, dry.  Nonpitting edema of bilateral lower extremities and bilateral wrists. Neuro: Alert and appropriately responding to questions.  Laboratory: Most recent CBC Lab Results  Component Value Date   WBC 11.3 (H)  11/25/2023   HGB 9.3 (L) 11/25/2023   HCT 29.5 (L) 11/25/2023   MCV 93.9 11/25/2023   PLT 173 11/25/2023   Most recent BMP    Latest Ref Rng & Units 11/25/2023    2:17 AM  BMP  Glucose 70 - 99 mg/dL 859   BUN 8 - 23 mg/dL 46   Creatinine  9.55 - 1.00 mg/dL 6.58   Sodium 864 - 854 mmol/L 138   Potassium 3.5 - 5.1 mmol/L 4.3   Chloride 98 - 111 mmol/L 108   CO2 22 - 32 mmol/L 19   Calcium  8.9 - 10.3 mg/dL 9.2    T4: 9.25   Larraine Palma, MD 11/25/2023, 7:09 AM  PGY-1, [facility] FPTS Intern pager: [phone], text pages welcome Secure chat group [facility] Teaching Service

## 2023-11-25 NOTE — Progress Notes (Signed)
 Heart Failure Navigator Progress Note  Assessed for Heart & Vascular TOC clinic readiness.  Patient does not meet criteria due to .  mild dementia, Refused ECHO. No HF TOC.   Navigator will sign off at this time.   Stephane Haddock, BSN, Scientist, Clinical (histocompatibility And Immunogenetics) Only

## 2023-11-25 NOTE — Progress Notes (Signed)
 PT Cancellation Note  Patient Details Name: Abigail Wallace MRN: [id number] DOB: 05/19/1945   Cancelled Treatment:    Reason Eval/Treat Not Completed: Other (comment) (order received, await xrays prior to eval)   [name] B [name] 11/25/2023, 1:22 PM Lenoard SQUIBB, PT Acute Rehabilitation Services Office: [phone]

## 2023-11-25 NOTE — Plan of Care (Signed)
 Went to see patient alongside Dr. Anders to reassess given reports of vaginal irritation/pain.  Patient son had also called in via phone at time of our visit, and reported concerns about patient's lower back and hip or leg pain, which had been ongoing for the past 2 weeks.  Informed patient son that we would do an exam and follow-up with him later today without recommendations.  Physical exam Back: Questionable point tenderness over the very end of lumbar spine, difficult to differentiate from possible paraspinal pain.  Otherwise no other spinal point tenderness GU: No significant erythema of outer vagina.  Exam limited by patient's body habitus and inability to maintain positioning to continue exam.  Chaperone: Dr. Otto Anders  Vaginal discomfort - Continue vaginal nystatin  cream as per earlier plan - No indication for full pelvic exam at this time; can consider if symptoms not improving in outpatient setting  Back pain Hip pain Called patient's PCP, Dr. Suzann Daring, who had plan to get imaging of patient's back while she was hospitalized. - X-rays of lumbar spine, bilateral hips ordered - PT eval and treat - Son called and informed of treatment plan  Will plan to discharge patient tomorrow pending imaging and arranging transportation back home. Son available after 4:30pm; will let PTAR know.

## 2023-11-25 NOTE — Plan of Care (Signed)
  Problem: Education: Goal: Knowledge of General Education information will improve Description: Including pain rating scale, medication(s)/side effects and non-pharmacologic comfort measures Outcome: Progressing   Problem: Health Behavior/Discharge Planning: Goal: Ability to manage health-related needs will improve Outcome: Progressing   Problem: Clinical Measurements: Goal: Respiratory complications will improve Outcome: Progressing Goal: Cardiovascular complication will be avoided Outcome: Progressing   Problem: Activity: Goal: Risk for activity intolerance will decrease Outcome: Progressing   Problem: Coping: Goal: Level of anxiety will decrease Outcome: Progressing   

## 2023-11-25 NOTE — Hospital Course (Addendum)
 Abigail Wallace is a 78 y.o. year old with a history of CHF, SSS s/p PPM, A fib, CAD, HTN, HLD, T2DM, CPOD, CKD, schizophrenia, chronic indwelling Foley who presented with dyspnea and was admitted to the [facility] Medicine Teaching Service for heart failure exacerbation.  Dyspnea Acute on chronic heart failure Patient presented with a week of dyspnea. On home O2 with 2L via [place] intermittently. O2 was supplemented as needed during hospitalization. Treated with IV Lasix  x1 with good diuresis and improvement in symptoms. Discharged on home po diuretic regimen (Lasix  40 mg every other day).  Chronic indwelling Foley catheter Vaginal Discomfort Initial UA with nitrites and leukocytes in context of chronic Foley.  Foley was removed and In-N-Out cath performed with UA done on this straw without signs of UTI.  Foley replaced following.  Patient did continue to report vaginal discomfort following this, and pelvic exam was attempted.  Could only complete superficial exam given patient's body habitus and limited mobility, but no significant findings of external vagina seen.  Patient started on vaginal nystatin  cream.  Back pain  Hip pain Per patient's son, patient has experience at least 2 weeks of new back and hip/leg pain.  Patient reported this pain while in the hospital and had limited mobility due to this.  Questionable lumbar spine point tenderness on exam.  Consulted with patient's PCP, Dr. Suzann Daring, who recommended further workup inpatient.  X-rays of lumbar spine and bilateral hips were done which showed significant arthritic changes without fractures or other concerning findings.  Also got PT eval, which did not find any further needs from patient's baseline.  Other chronic conditions were medically managed with home medications and formulary alternatives as necessary (Anemia, T2DM).  PCP Follow-up Recommendations: Recheck kidney function with BMP Consider adjusting Lasix  dose Repeat UA  and culture if symptoms of vaginal discomfort persisting; also consider thorough pelvic exam as indicated Consider oral candida treatment if topicals not addressing symptoms adequately Follow-up infrarenal abdominal aortic aneurysm in 2 years with CT

## 2023-11-26 ENCOUNTER — Telehealth: Payer: Self-pay

## 2023-11-26 DIAGNOSIS — R0600 Dyspnea, unspecified: Secondary | ICD-10-CM | POA: Diagnosis not present

## 2023-11-26 LAB — BASIC METABOLIC PANEL WITH GFR
Anion gap: 9 (ref 5–15)
BUN: 45 mg/dL — ABNORMAL HIGH (ref 8–23)
CO2: 20 mmol/L — ABNORMAL LOW (ref 22–32)
Calcium: 9.7 mg/dL (ref 8.9–10.3)
Chloride: 107 mmol/L (ref 98–111)
Creatinine, Ser: 3.04 mg/dL — ABNORMAL HIGH (ref 0.44–1.00)
GFR, Estimated: 15 mL/min — ABNORMAL LOW (ref >60)
Glucose, Bld: 132 mg/dL — ABNORMAL HIGH (ref 70–99)
Potassium: 4.5 mmol/L (ref 3.5–5.1)
Sodium: 136 mmol/L (ref 135–145)

## 2023-11-26 LAB — URINE CULTURE: Culture: >=100000 — AB

## 2023-11-26 LAB — GLUCOSE, CAPILLARY
Glucose-Capillary: 126 mg/dL — ABNORMAL HIGH (ref 70–99)
Glucose-Capillary: 172 mg/dL — ABNORMAL HIGH (ref 70–99)
Glucose-Capillary: 146 mg/dL — ABNORMAL HIGH (ref 70–99)

## 2023-11-26 MED ORDER — SODIUM BICARBONATE 650 MG PO TABS: 650.0000 mg | ORAL_TABLET | Freq: Three times a day (TID) | ORAL | 3 refills | Status: DC

## 2023-11-26 NOTE — Plan of Care (Signed)
°  Problem: Education: °Goal: Knowledge of General Education information will improve °Description: Including pain rating scale, medication(s)/side effects and non-pharmacologic comfort measures °Outcome: Progressing °  °Problem: Clinical Measurements: °Goal: Diagnostic test results will improve °Outcome: Progressing °  °Problem: Clinical Measurements: °Goal: Respiratory complications will improve °Outcome: Progressing °  °

## 2023-11-26 NOTE — Telephone Encounter (Signed)
 Patient's son LVM on nurse line this morning. Returned call to son.   He reports that sodium bicarbonate  prescription at the pharmacy is for BID dosing, however, Dr. Delores increased to TID dosing. Pharmacy will need new prescription per insurance guidelines. Per chart review, Dr. Cleotilde sent over new prescription this morning.  Son also reports issues with scheduling in office hospital follow up. Patient is unable to get into office within ambulance transportation. Son is asking if we can send Dr. Delores a message to schedule for a home visit.   Forwarding to PCP.   [name], RN

## 2023-11-26 NOTE — Telephone Encounter (Signed)
 Visit has been scheduled.  Suzann Daring, MD  Family Medicine Teaching Service

## 2023-11-26 NOTE — Evaluation (Signed)
 Physical Therapy Brief Evaluation and Discharge Note Patient Details Name: Abigail Wallace MRN: [id number] DOB: 23-Sep-1945 Today's Date: 11/26/2023   History of Present Illness  78 yo F adm 11/24/23 with dyspnea, acute on chronic CHF. PMH: COPD on 2L, CHF, mild dementia, schizophrenia, urinary rentention with chronic foley, post polio syndrome, gout, CKD, T2DM, PPM  Clinical Impression  Pt pleasant and reports PCA from 7a-6p daily while son works. Pt requires assist for bed level mobility and can sit EOB but uses hoyer lift for OOB at baseline. Pt able to pivot to EOB and back with assist, sat EOB 7 min to eat breakfast without support. Pt states baseline functional status without need for further DME or assist at this time.        PT Assessment Patient does not need any further PT services  Assistance Needed at Discharge  Frequent or constant Supervision/Assistance    Equipment Recommendations None recommended by PT  Recommendations for Other Services       Precautions/Restrictions Precautions Precautions: Fall Recall of Precautions/Restrictions: Intact        Mobility  Bed Mobility Rolling: Min assist Supine/Sidelying to sit: Min assist, HOB elevated, Used rails Sit to supine/sidelying: Mod assist General bed mobility comments: pt required min assist with rail to complete rolling bil, pivot supine to sit with HOB 30 degrees and use of rail min assist. Mod assist to return to supine and max +2 to slide toward [facility] and position in midline  Transfers                   General transfer comment: unable to stand at baseline, transfers via hoyer lift    Ambulation/Gait              Home Activity Instructions    Stairs            Modified Rankin (Stroke Patients Only)        Balance Overall balance assessment: Mild deficits observed, not formally tested   Sitting balance-Leahy Scale: Fair Sitting balance - Comments: once EOB with feet  supported pt able to sit without support                  Pertinent Vitals/Pain PT - Brief Vital Signs All Vital Signs Stable: Yes Pain Assessment Pain Assessment: No/denies pain     Home Living Family/patient expects to be discharged to:: Private residence Living Arrangements: Children Available Help at Discharge: Family;Personal care attendant;Available 24 hours/day Home Environment: Level entry   Home Equipment: Shower seat;BSC/3in1;Tub bench;Rollator (4 wheels);Hospital bed;Wheelchair - Careers Adviser (comment)   Additional Comments: hoyer lift, PCA 7a-6p    Prior Function Level of Independence: Needs assistance Comments: assist for all ADLs and IADls, self feeds. Pivots to EOB with aid, lift OOB to WC    UE/LE Assessment   UE ROM/Strength/Tone/Coordination: Generalized weakness    LE ROM/Strength/Tone/Coordination: Generalized weakness      Communication   Communication Communication: Impaired Factors Affecting Communication: Reduced clarity of speech     Cognition Overall Cognitive Status: Appears within functional limits for tasks assessed/performed       General Comments      Exercises     Assessment/Plan    PT Problem List         PT Visit Diagnosis Other abnormalities of gait and mobility (R26.89)    No Skilled PT All education completed;Patient at baseline level of functioning;Patient will have necessary level of assist by caregiver at discharge  Co-evaluation                AMPAC 6 Clicks Help needed turning from your back to your side while in a flat bed without using bedrails?: A Little Help needed moving from lying on your back to sitting on the side of a flat bed without using bedrails?: A Little Help needed moving to and from a bed to a chair (including a wheelchair)?: Total Help needed standing up from a chair using your arms (e.g., wheelchair or bedside chair)?: Total Help needed to walk in hospital room?: Total Help  needed climbing 3-5 steps with a railing? : Total 6 Click Score: 10      End of Session   Activity Tolerance: Patient tolerated treatment well Patient left: in bed;with call bell/phone within reach;with bed alarm set Nurse Communication: Mobility status PT Visit Diagnosis: Other abnormalities of gait and mobility (R26.89)     Time: 9242-9180 PT Time Calculation (min) (ACUTE ONLY): 22 min  Charges:   PT Evaluation $PT Eval Low Complexity: 1 Low      Eugen Jeansonne P, PT Acute Rehabilitation Services Office: [phone]   Lenoard NOVAK Arlie Posch  11/26/2023, 10:01 AM

## 2023-11-26 NOTE — Discharge Summary (Signed)
 Family Medicine Teaching [facility] Discharge Summary  Patient name: Abigail Wallace [facility] Medical record number: [id number] Date of birth: [birth year] Age: 78 y.o. Gender: female Date of Admission: 11/24/2023  Date of Discharge: 11/26/23 Admitting Physician: Otto [name] Fairly, MD  Primary Care Provider: Delores Suzann HERO, MD Consultants: None  Indication for Hospitalization: Dyspnea  Discharge Diagnoses/Problem List:  Principal Problem for Admission: Acute CHF exacerbation Other Problems addressed during stay:  Active Problems:   Well controlled type 2 diabetes mellitus (HCC)   Acute on chronic congestive heart failure (HCC)   Anemia   Indwelling catheter present on admission   Lumbar pain   T2DM, HTN, CKD, COPD, HLD, schizophrenia, A fib, SSS s/p PPM    Brief Hospital Course:  Abigail Wallace is a 78 y.o. year old with a history of CHF, SSS s/p PPM, A fib, CAD, HTN, HLD, T2DM, CPOD, CKD, schizophrenia, chronic indwelling Foley who presented with dyspnea and was admitted to the [facility] Medicine Teaching Service for heart failure exacerbation.  Dyspnea Acute on chronic heart failure Patient presented with a week of dyspnea. On home O2 with 2L via [place] intermittently. O2 was supplemented as needed during hospitalization. Treated with IV Lasix  x1 with good diuresis and improvement in symptoms. Discharged on home po diuretic regimen (Lasix  40 mg every other day).  Chronic indwelling Foley catheter Vaginal Discomfort Initial UA with nitrites and leukocytes in context of chronic Foley.  Foley was removed and In-N-Out cath performed with UA done on this straw without signs of UTI.  Foley replaced following.  Patient did continue to report vaginal discomfort following this, and pelvic exam was attempted.  Could only complete superficial exam given patient's body habitus and limited mobility, but no significant findings of external vagina seen.  Patient started on vaginal  nystatin  cream.  Back pain  Hip pain Per patient's son, patient has experience at least 2 weeks of new back and hip/leg pain.  Patient reported this pain while in the hospital and had limited mobility due to this.  Questionable lumbar spine point tenderness on exam.  Consulted with patient's PCP, Dr. Suzann Delores, who recommended further workup inpatient.  X-rays of lumbar spine and bilateral hips were done which showed significant arthritic changes without fractures or other concerning findings.  Also got PT eval, which did not find any further needs from patient's baseline.  Other chronic conditions were medically managed with home medications and formulary alternatives as necessary (Anemia, T2DM).  PCP Follow-up Recommendations: Recheck kidney function with BMP Consider adjusting Lasix  dose Repeat UA and culture if symptoms of vaginal discomfort persisting; also consider thorough pelvic exam as indicated Consider oral candida treatment if topicals not addressing symptoms adequately Follow-up infrarenal abdominal aortic aneurysm in 2 years with CT    Results/Tests Pending at Time of Discharge:  Unresulted Labs (From admission, onward)    None        Disposition: Home  Discharge Condition: Stable  Discharge Exam:  Vitals:   11/26/23 0801 11/26/23 1043  BP: (!) 154/77 (!) 148/47  Pulse: 73 71  Resp: 18 19  Temp: 98.6 F (37 C) 97.9 F (36.6 C)  SpO2: 92% 95%   Physical exam: General: Patient sitting upright in bed finishing breakfast, no acute distress. Cardiovascular: Heart sounds somewhat muffled by body habitus.  Regular rate and rhythm, no murmurs/rubs/gallops. Respiratory: Normal work of breathing on room air. Clear to auscultation bilaterally; no wheezes, crackles. Abdomen: Bowel sounds present and normoactive bilaterally. Soft,  nontender, two-parent. Extremities: Skin warm, dry.  Nonpitting edema versus adipose tissue of bilateral lower extremities and  wrists.  Significant Procedures: None  Significant Labs and Imaging:  Recent Labs  Lab 11/25/23 0217  WBC 11.3*  HGB 9.3*  HCT 29.5*  PLT 173   Recent Labs  Lab 11/25/23 0217 11/26/23 0235  NA 138 136  K 4.3 4.5  CL 108 107  CO2 19* 20*  GLUCOSE 140* 132*  BUN 46* 45*  CREATININE 3.41* 3.04*  CALCIUM  9.2 9.7    Lumbar Xray 11/10: 1. No acute findings. 2. Transitional L5 vertebra with broad right transverse process that pseudo articulates with the sacrum. 3. Stable grade 1 anterolisthesis at L4-5. 4. Degenerative facet arthropathy at L4-5. 5. Calcified infrarenal abdominal aortic aneurysm, better measured on prior CT due to the lack of magnification affects, and measured at about 3.5 cm in diameter on that exam.  Xray Right Hip 11/10: Markedly severe chronic right hip arthropathy with severe spurring, moderate flattening and volume loss in the femoral head, and subcortical lucencies favoring degenerative subcortical cystic lesions.  Xray Left Hip 11/10: Moderate degenerative left hip arthropathy.    Discharge Medications:  Allergies as of 11/26/2023       Reactions   Abilify [aripiprazole] Anaphylaxis, Shortness Of Breath, Other (See Comments)   Tremors, also   Keflex  [cephalexin ] Swelling, Other (See Comments)   Tongue swelling   Versacloz  [clozapine ] Anaphylaxis   Ativan  [lorazepam ] Other (See Comments)   Unknown reaction   Benadryl [diphenhydramine Hcl] Other (See Comments)   Vision changes   Benztropine  Other (See Comments)   Mobility issues   Latuda [lurasidone Hcl] Other (See Comments)   Tremors   Remeron [mirtazapine] Other (See Comments)   Tremors   Haldol  [haloperidol  Lactate] Other (See Comments)   Tardive dyskinesias  Tremors Somnolence Speech changes Sialorrhea    Codeine Other (See Comments)   Unknown reaction   Latex Rash        Medication List     STOP taking these medications    amoxicillin  250 MG capsule Commonly known as:  AMOXIL        TAKE these medications    Accu-Chek Guide Test test strip Generic drug: glucose blood Use to check blood glucose once daily and as needed for symptoms   acetaminophen  500 MG tablet Commonly known as: TYLENOL  Take 500-1,000 mg by mouth every 6 (six) hours as needed for mild pain (pain score 1-3) or headache.   albuterol  108 (90 Base) MCG/ACT inhaler Commonly known as: VENTOLIN  HFA Inhale 2 puffs into the lungs every 6 (six) hours as needed for wheezing or shortness of breath.   allopurinol  100 MG tablet Commonly known as: ZYLOPRIM  Take 0.5 tablets (50 mg total) by mouth every other day. What changed: when to take this   amLODipine  10 MG tablet Commonly known as: NORVASC  Take 1 tablet (10 mg total) by mouth at bedtime. What changed: when to take this   apixaban  2.5 MG Tabs tablet Commonly known as: ELIQUIS  Take 1 tablet (2.5 mg total) by mouth 2 (two) times daily.   cholecalciferol  25 MCG (1000 UNIT) tablet Commonly known as: VITAMIN D3 Take 1 tablet (1,000 Units total) by mouth daily.   diclofenac  Sodium 1 % Gel Commonly known as: Voltaren  Apply 2 g topically 4 (four) times daily as needed (pain).   divalproex  250 MG DR tablet Commonly known as: DEPAKOTE  Take 1 tablet (250 mg total) by mouth every 12 (twelve) hours.  freestyle lancets Use to check blood glucose once daily and as needed for symptoms   furosemide  40 MG tablet Commonly known as: Lasix  Take 1.5 tablets (60 mg total) by mouth every other day.   ipratropium-albuterol  0.5-2.5 (3) MG/3ML Soln Commonly known as: DUONEB Take 3 mLs by nebulization every 4 (four) hours as needed. What changed:  when to take this additional instructions   metoprolol  tartrate 100 MG tablet Commonly known as: LOPRESSOR  Take 1 tablet (100 mg total) by mouth 3 (three) times daily.   Multivitamin Women 50+ Tabs Take 1 tablet by mouth daily with breakfast.   nitroGLYCERIN  0.4 MG SL tablet Commonly known  as: NITROSTAT  Place 1 tablet (0.4 mg total) under the tongue every 5 (five) minutes as needed for chest pain.   nystatin  powder Commonly known as: nystatin  Apply 1 Application topically daily. Apply after bathing. What changed:  when to take this additional instructions   nystatin  cream Commonly known as: MYCOSTATIN  Apply 1 Application topically daily as needed (Rash). What changed:  when to take this additional instructions   OLANZapine  10 MG tablet Commonly known as: ZYPREXA  Take 15 mg by mouth at bedtime.   OVER THE COUNTER MEDICATION Apply 1 application  topically See admin instructions. Caldesene Medicated Protecting Powder- Apply under the breasts and between the skin folds 2 times a day as needed for irritation   polyethylene glycol powder 17 GM/SCOOP powder Commonly known as: GLYCOLAX /MIRALAX  Take 17 g by mouth 3 (three) times daily.   pravastatin  40 MG tablet Commonly known as: PRAVACHOL  Take 1 tablet (40 mg total) by mouth every evening. What changed: when to take this   Semaglutide (0.25 or 0.5MG /DOS) 2 MG/1.5ML Sopn Inject 0.25 mg into the skin once a week. What changed: when to take this   senna 8.6 MG Tabs tablet Commonly known as: SENOKOT Take 1 tablet (8.6 mg total) by mouth daily as needed for mild constipation. What changed: when to take this   sodium bicarbonate  650 MG tablet Take 1 tablet (650 mg total) by mouth 3 (three) times daily.   Trelegy Ellipta  100-62.5-25 MCG/ACT Aepb Generic drug: Fluticasone -Umeclidin-Vilant Inhale 1 puff into the lungs daily.   triamcinolone  ointment 0.5 % Commonly known as: KENALOG  Apply 1 Application topically 2 (two) times daily as needed (skin irritation).        Discharge Instructions: Please refer to Patient Instructions section of EMR for full details.  Patient was counseled important signs and symptoms that should prompt return to medical care, changes in medications, dietary instructions, activity  restrictions, and follow up appointments.   Follow-Up Appointments:  Future Appointments  Date Time Provider Department Center  12/30/2023  7:05 AM CVD HVT DEVICE REMOTES [facility] H&V  03/30/2024  7:05 AM CVD HVT DEVICE REMOTES [facility] H&V  06/29/2024  7:05 AM CVD HVT DEVICE REMOTES [facility] H&V  09/28/2024  7:05 AM CVD HVT DEVICE REMOTES [facility] H&V  12/28/2024  7:05 AM CVD HVT DEVICE REMOTES [facility] H&V    Larraine Palma, MD 11/26/2023, 12:10 PM PGY-1, [facility]

## 2023-11-26 NOTE — Progress Notes (Signed)
 FMTS PCP Note In to see patient as PCP. Reports she feels improved. Breathing and back pain  both improved. Appreciate FMTS care of patient.  Suzann Daring, MD  Family Medicine Teaching Service

## 2023-11-26 NOTE — TOC Transition Note (Signed)
 Transition of Care [facility]) - Discharge Note   Patient Details  Name: Abigail Wallace MRN: [id number] Date of Birth: [birth year]  Transition of [facility]) CM/SW Contact:  Tom-Johnson, Zeidy Tayag Daphne, RN Phone Number: 11/26/2023, 11:24 AM   Clinical Narrative:     Patient is scheduled for discharge today.  Readmission Risk Assessment done. Outpatient f/u, hospital f/u and discharge instructions on AVS. PTAR scheduled to transport at discharge.  No further ICM needs noted.     Final next level of care: Home/Self Care Barriers to Discharge: Barriers Resolved   Patient Goals and CMS Choice Patient states their goals for this hospitalization and ongoing recovery are:: To return home CMS Medicare.gov Compare Post Acute Care list provided to:: Patient Choice offered to / list presented to : Patient, Adult Children      Discharge Placement                       Discharge Plan and Services Additional resources added to the After Visit Summary for                  DME Arranged: N/A DME Agency: NA       HH Arranged: NA HH Agency: NA        Social Drivers of Health (SDOH) Interventions SDOH Screenings   Food Insecurity: No Food Insecurity (11/24/2023)  Housing: Low Risk  (11/24/2023)  Transportation Needs: No Transportation Needs (11/24/2023)  Utilities: Not At Risk (11/24/2023)  Alcohol  Screen: Low Risk  (12/27/2020)  Depression (PHQ2-9): High Risk (09/30/2023)  Financial Resource Strain: Low Risk  (03/23/2022)  Physical Activity: Inactive (12/27/2020)  Social Connections: Socially Isolated (11/24/2023)  Stress: No Stress Concern Present (12/27/2020)  Tobacco Use: Medium Risk (11/24/2023)     Readmission Risk Interventions    11/26/2023   11:22 AM 06/19/2023    9:04 AM  Readmission Risk Prevention Plan  Transportation Screening Complete Complete  PCP or Specialist Appt within 5-7 Days  Complete  PCP or Specialist Appt within 3-5 Days Complete   Home  Care Screening  Complete  Medication Review (RN CM)  Referral to Pharmacy  HRI or Home Care Consult Complete   Social Work Consult for Recovery Care Planning/Counseling Complete   Palliative Care Screening Not Applicable   Medication Review Oceanographer) Referral to Pharmacy

## 2023-11-26 NOTE — Plan of Care (Signed)

## 2023-11-27 ENCOUNTER — Telehealth: Payer: Self-pay | Admitting: Family Medicine

## 2023-11-27 ENCOUNTER — Telehealth: Payer: Self-pay

## 2023-11-27 NOTE — Telephone Encounter (Signed)
 Please call [name] and ask for labs early next week: CBC with differential Complete metabolic panel  Thank you Suzann Daring, MD  [facility] Medicine Teaching Service

## 2023-11-27 NOTE — Transitions of Care (Post Inpatient/ED Visit) (Signed)
   11/27/2023  Name: Abigail Wallace MRN: [id number] DOB: 08/07/1945  Today's TOC FU Call Status: Today's TOC FU Call Status:: Unsuccessful Call (1st Attempt) Unsuccessful Call (1st Attempt) Date: 11/27/23  Attempted to reach the patient regarding the most recent Inpatient/ED visit.  Follow Up Plan: Additional outreach attempts will be made to reach the patient to complete the Transitions of Care (Post Inpatient/ED visit) call.   Shona Prow RN, CCM [place]  VBCI-Population Health RN Care Manager [phone]

## 2023-11-27 NOTE — Telephone Encounter (Signed)
 Called [facility] and provided with verbal orders for labs.   Abigail JAYSON English, RN

## 2023-11-27 NOTE — Telephone Encounter (Signed)
 Agree with Lasix  today Suzann Daring, MD  [facility] Medicine Teaching Service

## 2023-11-27 NOTE — Telephone Encounter (Signed)
 Patient's son calls nurse line requesting to verify if patient received Lasix  yesterday.   Advised son that per chart review, patient received 1 dose of IV lasix  on 11/24/23 with discharge instructions to restart every other day dosing of PO Lasix .   I was unable to find any documentation that PO or IV lasix  was administered yesterday prior to discharge.   Discussed this with son. He is going to give her Lasix  dosage today.   Also informed son that Dr. Delores placed orders for lab work early next week.   [name] has been called and verbal orders were given.   [name], RN

## 2023-11-28 ENCOUNTER — Telehealth: Payer: Self-pay

## 2023-11-28 NOTE — Transitions of Care (Post Inpatient/ED Visit) (Signed)
   11/28/2023  Name: Abigail Wallace [facility] MRN: [id number] DOB: [birth year]  Today's TOC FU Call Status: Today's TOC FU Call Status:: Unsuccessful Call (2nd Attempt) Unsuccessful Call (2nd Attempt) Date: 11/28/23  Attempted to reach the patient regarding the most recent Inpatient/ED visit.  Follow Up Plan: Additional outreach attempts will be made to reach the patient to complete the Transitions of Care (Post Inpatient/ED visit) call.   Shona Prow RN, CCM [place]  VBCI-Population Health RN Care Manager [phone]

## 2023-11-28 NOTE — Transitions of Care (Post Inpatient/ED Visit) (Signed)
   11/28/2023  Name: Aspyn Warnke [facility] MRN: [id number] DOB: 1945-07-21  Today's TOC FU Call Status: Today's TOC FU Call Status:: Successful TOC FU Call Completed TOC FU Call Complete Date: 11/28/23 (nbound call received from patient's son who left voicemail asking what the call is about - Call back to son who was given information on our [facility] program and son said, Wilbert not interested. We have home health and follow up closely with her doctors.)  Patient's Name and Date of Birth confirmed. Name, DOB  Shona Prow RN, CCM [place]  VBCI-Population Health RN Care Manager [phone]

## 2023-11-29 ENCOUNTER — Inpatient Hospital Stay: Payer: Self-pay | Admitting: Family Medicine

## 2023-12-02 ENCOUNTER — Telehealth: Payer: Self-pay

## 2023-12-02 NOTE — Telephone Encounter (Signed)
 Received call from Krystal, RN at [facility] regarding patient.   She reports that she was at home visit today and attempted to draw patient's labs. She attempted x3 and was unsuccessful.   They will be sending out another nurse tomorrow to attempt lab draw.   FYI.   [name], RN

## 2023-12-04 ENCOUNTER — Telehealth: Payer: Self-pay | Admitting: Family Medicine

## 2023-12-04 DIAGNOSIS — N1832 Chronic kidney disease, stage 3b: Secondary | ICD-10-CM

## 2023-12-04 NOTE — Telephone Encounter (Signed)
 Called son--he would like a call tomorrow  Suzann Daring, MD  [facility] Medicine Teaching Service

## 2023-12-05 MED ORDER — FUROSEMIDE 40 MG PO TABS: 80.0000 mg | ORAL_TABLET | ORAL | 0 refills | Status: DC

## 2023-12-05 MED ORDER — SODIUM BICARBONATE 650 MG PO TABS: 650.0000 mg | ORAL_TABLET | Freq: Three times a day (TID) | ORAL | 3 refills | Status: DC

## 2023-12-05 NOTE — Addendum Note (Signed)
 Addended by: DELORES, Kellsey Sansone on: 12/05/2023 12:31 PM   Modules accepted: Orders

## 2023-12-05 NOTE — Telephone Encounter (Addendum)
 Called son. His mom is doing okay. She has a new bed.   WBC 10.7  Hemoglobin 10.5  Hematocrit 33.1  Platelets 244   Sodium 136  Creatinine 2.91 Potassium 4.9 BUN 38   Has been slightly net positive. Increase Lasix  to 80 mg every other day.   RN team - please call [name] and request CBC, BMP, and TSH with free T4 on the week of 12/16/23  Suzann Daring, MD  Family Medicine Teaching Service

## 2023-12-06 NOTE — Telephone Encounter (Signed)
 Attempted to call nurse to provide verbal orders. Spoke with Mitzie who advised that nurse was on another call. Requested that I call back later today.   Will call back later this morning.   [name], RN

## 2023-12-09 NOTE — Telephone Encounter (Signed)
 Spoke with Chava at [facility].   Provided with verbal orders per Dr. Delores.   [name], RN

## 2023-12-11 ENCOUNTER — Other Ambulatory Visit: Payer: Self-pay | Admitting: Family Medicine

## 2023-12-11 DIAGNOSIS — G8929 Other chronic pain: Secondary | ICD-10-CM

## 2023-12-11 MED ORDER — OXYCODONE HCL 5 MG PO TABS: 2.5000 mg | ORAL_TABLET | Freq: Three times a day (TID) | ORAL | 0 refills | Status: AC | PRN

## 2023-12-11 NOTE — Progress Notes (Signed)
 Oxycodone  2.5 mg 5 day script sent in.

## 2023-12-11 NOTE — Telephone Encounter (Signed)
 Called [name].   He reports she has been able to tolerate fluids so far this morning, however he reports her aide is with her now. He reports normal urine output as well.   He denies any fevers or abdominal pain at this time. He denies any diarrhea. No blood. No distention.   He reports continued pain and would like to proceed with an oxycodone  prescription. He is requesting something for nausea as well if appropriate.   He reports he will continue to monitor her symptoms.   Strict ED precautions given.   Will forward to PCP.

## 2023-12-11 NOTE — Telephone Encounter (Signed)
 Spoke with Dr. Donzetta regarding request for anti nausea medication.   Due to patient's cardiac history/irregular rhythms, it would not be safe to prescribe antiemetic such as Zofran .   Called son and relayed this information. He was having issues with connection and will either call me back or send follow up via mychart.   [name], RN

## 2023-12-11 NOTE — Telephone Encounter (Signed)
 Sent in low dose oxycodone  2.5 mg q8hr PRN for 5 day script.

## 2023-12-16 ENCOUNTER — Encounter: Payer: Self-pay | Admitting: Pharmacist

## 2023-12-16 ENCOUNTER — Telehealth: Payer: Self-pay

## 2023-12-16 ENCOUNTER — Encounter: Payer: Self-pay | Admitting: Family Medicine

## 2023-12-16 NOTE — Telephone Encounter (Signed)
 Abigail Wallace from [facility] calling for skilled nursing verbal orders as follows:  1 time every other week for 8 weeks.   Verbal orders given per [facility] protocol  Abigail JAYSON English, RN

## 2023-12-16 NOTE — Progress Notes (Signed)
 This patient is appearing on a report for being at risk of failing the adherence measure for diabetes medications this calendar year.   Medication: ozempic  and linagliptin  Last fill date: 8/26 and 4/14  - both fail for 2025  Reviewed medication indication, dosing, and goals of therapy.    Dr. Delores messaged to consider return to linagliptin  due to nausea with Ozempic  (semaglutide ) and good glucose control in patient 78 years old.

## 2023-12-19 ENCOUNTER — Telehealth: Payer: Self-pay | Admitting: Family Medicine

## 2023-12-19 MED ORDER — OXYCODONE HCL 5 MG PO TABS: 2.5000 mg | ORAL_TABLET | Freq: Two times a day (BID) | ORAL | 0 refills | Status: DC | PRN

## 2023-12-19 NOTE — Telephone Encounter (Signed)
 Patient's son returns call to nurse line. He forgot to mention to Dr. Delores that patient has been experiencing intermittent, sporadic nosebleeds since the air has been drier.   He states that these will occur when she is blowing her nose. They resolve within a few minutes, states that amount is minimal.   I asked about lightheadedness/dizziness, however son reports that patient complains of dizziness all the time.   Patient does not take nasal spray regularly. Son asked about Afrin, advised that this is not something that we recommend long term. Told son that I would send message to Dr. Delores to see if patient could get a prescription for flonase to possibly help with drier nasal passages.   ED precautions reiterated.   Son reports that if patient is not feeling better tomorrow morning, she will seek care.   [name], RN

## 2023-12-19 NOTE — Telephone Encounter (Signed)
 Spoke with son. Recommended ED evaluation. Discussed recommendation that given hypoxemia and symptoms, they proceed to ED. He will discuss with patient.   Suzann Daring, MD  Family Medicine Teaching Service

## 2023-12-19 NOTE — Telephone Encounter (Signed)
 Please let them know okay to use afrin for 3 days then stop.  I recommend a humidifier in the room--which we may need to add to oxygen .   Suzann Daring, MD  Family Medicine Teaching Service

## 2023-12-19 NOTE — Telephone Encounter (Signed)
 After hours call  Patient has been sick over the last week. Her son talked with Dr. Delores today who recommended ED evaluation. Son is calling to let us  know she is coming to ED from ambulance.  BP has been 177/89 today from her baseline of 140s SBP. She has been on O2 for SpO2 in the 80%s with more SOB. With 2L of O2 on, her SpO2 is in the 90%s. She is also in a lot of pain despite the low-dose oxycodone  that has been Rx by Dr. Donzetta and Dr. Delores. She has been taking all of her medications as prescribed. She has been running a temperature of 100 to 100.1.  He does NOT want his mother in 3E ward if admitted. He does NOT want her restrained given experiences at her last admission. Son is not able to come into the hospital with her due to him having a recent heart attack.  He will call 911 and have his mother transported to the ED now where she will likely be admitted for AHRF 2/2 ?PNA.

## 2023-12-19 NOTE — Telephone Encounter (Signed)
 Attempted to call son--he did not answer. Again, strongly recommend being seen-ED is  best. I am concerned his mom could have pneumonia.  Suzann Daring, MD  Family Medicine Teaching Service

## 2023-12-20 ENCOUNTER — Inpatient Hospital Stay (HOSPITAL_COMMUNITY)

## 2023-12-20 ENCOUNTER — Emergency Department (HOSPITAL_COMMUNITY)

## 2023-12-20 ENCOUNTER — Other Ambulatory Visit: Payer: Self-pay

## 2023-12-20 ENCOUNTER — Inpatient Hospital Stay (HOSPITAL_COMMUNITY)
Admission: EM | Admit: 2023-12-20 | Discharge: 2023-12-27 | DRG: 314 | Disposition: A | Attending: Family Medicine | Admitting: Family Medicine

## 2023-12-20 ENCOUNTER — Encounter (HOSPITAL_COMMUNITY): Payer: Self-pay

## 2023-12-20 DIAGNOSIS — N184 Chronic kidney disease, stage 4 (severe): Secondary | ICD-10-CM | POA: Diagnosis not present

## 2023-12-20 DIAGNOSIS — J411 Mucopurulent chronic bronchitis: Secondary | ICD-10-CM

## 2023-12-20 DIAGNOSIS — I330 Acute and subacute infective endocarditis: Secondary | ICD-10-CM

## 2023-12-20 DIAGNOSIS — G4733 Obstructive sleep apnea (adult) (pediatric): Secondary | ICD-10-CM | POA: Diagnosis present

## 2023-12-20 DIAGNOSIS — Z960 Presence of urogenital implants: Secondary | ICD-10-CM

## 2023-12-20 DIAGNOSIS — Z789 Other specified health status: Secondary | ICD-10-CM

## 2023-12-20 DIAGNOSIS — I10 Essential (primary) hypertension: Secondary | ICD-10-CM | POA: Diagnosis present

## 2023-12-20 DIAGNOSIS — I5033 Acute on chronic diastolic (congestive) heart failure: Secondary | ICD-10-CM | POA: Diagnosis present

## 2023-12-20 DIAGNOSIS — J210 Acute bronchiolitis due to respiratory syncytial virus: Secondary | ICD-10-CM | POA: Diagnosis not present

## 2023-12-20 DIAGNOSIS — N390 Urinary tract infection, site not specified: Secondary | ICD-10-CM | POA: Diagnosis present

## 2023-12-20 DIAGNOSIS — R197 Diarrhea, unspecified: Secondary | ICD-10-CM | POA: Insufficient documentation

## 2023-12-20 DIAGNOSIS — Z950 Presence of cardiac pacemaker: Secondary | ICD-10-CM | POA: Diagnosis present

## 2023-12-20 DIAGNOSIS — R7881 Bacteremia: Secondary | ICD-10-CM | POA: Diagnosis present

## 2023-12-20 DIAGNOSIS — T827XXA Infection and inflammatory reaction due to other cardiac and vascular devices, implants and grafts, initial encounter: Secondary | ICD-10-CM

## 2023-12-20 DIAGNOSIS — A408 Other streptococcal sepsis: Secondary | ICD-10-CM | POA: Diagnosis present

## 2023-12-20 DIAGNOSIS — T83511D Infection and inflammatory reaction due to indwelling urethral catheter, subsequent encounter: Secondary | ICD-10-CM

## 2023-12-20 DIAGNOSIS — Z515 Encounter for palliative care: Secondary | ICD-10-CM | POA: Diagnosis not present

## 2023-12-20 DIAGNOSIS — R443 Hallucinations, unspecified: Principal | ICD-10-CM

## 2023-12-20 DIAGNOSIS — J449 Chronic obstructive pulmonary disease, unspecified: Secondary | ICD-10-CM | POA: Diagnosis present

## 2023-12-20 DIAGNOSIS — R4182 Altered mental status, unspecified: Secondary | ICD-10-CM | POA: Diagnosis present

## 2023-12-20 DIAGNOSIS — R42 Dizziness and giddiness: Secondary | ICD-10-CM

## 2023-12-20 DIAGNOSIS — N3000 Acute cystitis without hematuria: Secondary | ICD-10-CM

## 2023-12-20 DIAGNOSIS — R110 Nausea: Secondary | ICD-10-CM

## 2023-12-20 DIAGNOSIS — N1832 Chronic kidney disease, stage 3b: Secondary | ICD-10-CM | POA: Diagnosis present

## 2023-12-20 DIAGNOSIS — B955 Unspecified streptococcus as the cause of diseases classified elsewhere: Secondary | ICD-10-CM | POA: Diagnosis present

## 2023-12-20 LAB — CBC WITH DIFFERENTIAL/PLATELET
Abs Immature Granulocytes: 0.41 K/uL — ABNORMAL HIGH (ref 0.00–0.07)
Basophils Absolute: 0.1 K/uL (ref 0.0–0.1)
Basophils Relative: 0 %
Eosinophils Absolute: 0.3 K/uL (ref 0.0–0.5)
Eosinophils Relative: 2 %
HCT: 27.7 % — ABNORMAL LOW (ref 36.0–46.0)
Hemoglobin: 8.9 g/dL — ABNORMAL LOW (ref 12.0–15.0)
Immature Granulocytes: 4 %
Lymphocytes Relative: 16 %
Lymphs Abs: 1.9 K/uL (ref 0.7–4.0)
MCH: 30 pg (ref 26.0–34.0)
MCHC: 32.1 g/dL (ref 30.0–36.0)
MCV: 93.3 fL (ref 80.0–100.0)
Monocytes Absolute: 1.3 K/uL — ABNORMAL HIGH (ref 0.1–1.0)
Monocytes Relative: 11 %
Neutro Abs: 7.8 K/uL — ABNORMAL HIGH (ref 1.7–7.7)
Neutrophils Relative %: 67 %
Platelets: 206 K/uL (ref 150–400)
RBC: 2.97 MIL/uL — ABNORMAL LOW (ref 3.87–5.11)
RDW: 14.4 % (ref 11.5–15.5)
WBC: 11.7 K/uL — ABNORMAL HIGH (ref 4.0–10.5)
nRBC: 0 % (ref 0.0–0.2)

## 2023-12-20 LAB — BLOOD CULTURE ID PANEL (REFLEXED) - BCID2

## 2023-12-20 LAB — ECHOCARDIOGRAM COMPLETE
AV Mean grad: 4 mmHg
AV Peak grad: 8.1 mmHg
Ao pk vel: 1.42 m/s
Area-P 1/2: 4.89 cm2
Calc EF: 31.8 %
S' Lateral: 3 cm
Single Plane A2C EF: 34.2 %
Single Plane A4C EF: 30.9 %
Weight: 5206.38 [oz_av]

## 2023-12-20 LAB — BRAIN NATRIURETIC PEPTIDE: B Natriuretic Peptide: 718.3 pg/mL — ABNORMAL HIGH (ref 0.0–100.0)

## 2023-12-20 LAB — URINALYSIS, ROUTINE W REFLEX MICROSCOPIC
Bilirubin Urine: NEGATIVE
Glucose, UA: >=500 mg/dL — AB
Hgb urine dipstick: NEGATIVE
Ketones, ur: NEGATIVE mg/dL
Nitrite: POSITIVE — AB
Protein, ur: >=300 mg/dL — AB
Specific Gravity, Urine: 1.01 (ref 1.005–1.030)
pH: 6 (ref 5.0–8.0)

## 2023-12-20 LAB — COMPREHENSIVE METABOLIC PANEL WITH GFR
ALT: 12 U/L (ref 0–44)
AST: 14 U/L — ABNORMAL LOW (ref 15–41)
Albumin: 2.6 g/dL — ABNORMAL LOW (ref 3.5–5.0)
Alkaline Phosphatase: 47 U/L (ref 38–126)
Anion gap: 12 (ref 5–15)
BUN: 32 mg/dL — ABNORMAL HIGH (ref 8–23)
CO2: 23 mmol/L (ref 22–32)
Calcium: 9.3 mg/dL (ref 8.9–10.3)
Chloride: 99 mmol/L (ref 98–111)
Creatinine, Ser: 2.83 mg/dL — ABNORMAL HIGH (ref 0.44–1.00)
GFR, Estimated: 16 mL/min — ABNORMAL LOW (ref >60)
Glucose, Bld: 199 mg/dL — ABNORMAL HIGH (ref 70–99)
Potassium: 3.7 mmol/L (ref 3.5–5.1)
Sodium: 134 mmol/L — ABNORMAL LOW (ref 135–145)
Total Bilirubin: 0.3 mg/dL (ref 0.0–1.2)
Total Protein: 6.2 g/dL — ABNORMAL LOW (ref 6.5–8.1)

## 2023-12-20 LAB — LIPASE, BLOOD: Lipase: 31 U/L (ref 11–51)

## 2023-12-20 LAB — CBG MONITORING, ED: Glucose-Capillary: 131 mg/dL — ABNORMAL HIGH (ref 70–99)

## 2023-12-20 LAB — TROPONIN I (HIGH SENSITIVITY)
Troponin I (High Sensitivity): 16 ng/L (ref <18)
Troponin I (High Sensitivity): 17 ng/L (ref <18)

## 2023-12-20 LAB — I-STAT CG4 LACTIC ACID, ED: Lactic Acid, Venous: 1.2 mmol/L (ref 0.5–1.9)

## 2023-12-20 MED ORDER — APIXABAN 2.5 MG PO TABS
2.5000 mg | ORAL_TABLET | Freq: Two times a day (BID) | ORAL | Status: DC
  Administered 2023-12-20 – 2023-12-27 (×15): 2.5 mg via ORAL
  Filled 2023-12-20 (×15): qty 1

## 2023-12-20 MED ORDER — LACTATED RINGERS IV BOLUS
1000.0000 mL | Freq: Once | INTRAVENOUS | Status: AC
  Administered 2023-12-20: 1000 mL via INTRAVENOUS

## 2023-12-20 MED ORDER — SULFAMETHOXAZOLE-TRIMETHOPRIM 800-160 MG PO TABS
1.0000 | ORAL_TABLET | Freq: Once | ORAL | Status: AC
  Administered 2023-12-20: 1 via ORAL
  Filled 2023-12-20: qty 1

## 2023-12-20 MED ORDER — NYSTATIN 100000 UNIT/GM EX CREA
1.0000 | TOPICAL_CREAM | Freq: Every day | CUTANEOUS | Status: DC | PRN
  Administered 2023-12-22 – 2023-12-26 (×3): 1 via TOPICAL
  Filled 2023-12-20: qty 30

## 2023-12-20 MED ORDER — FUROSEMIDE 10 MG/ML IJ SOLN
40.0000 mg | Freq: Once | INTRAMUSCULAR | Status: AC
  Administered 2023-12-20: 40 mg via INTRAVENOUS
  Filled 2023-12-20: qty 4

## 2023-12-20 MED ORDER — ALBUTEROL SULFATE (2.5 MG/3ML) 0.083% IN NEBU: 3.0000 mL | INHALATION_SOLUTION | Freq: Four times a day (QID) | RESPIRATORY_TRACT | Status: DC | PRN

## 2023-12-20 MED ORDER — SENNA 8.6 MG PO TABS: 1.0000 | ORAL_TABLET | Freq: Every day | ORAL | Status: DC

## 2023-12-20 MED ORDER — PREDNISONE 20 MG PO TABS
40.0000 mg | ORAL_TABLET | Freq: Every day | ORAL | Status: AC
  Administered 2023-12-21 – 2023-12-25 (×4): 40 mg via ORAL
  Filled 2023-12-20 (×5): qty 2

## 2023-12-20 MED ORDER — METOPROLOL TARTRATE 25 MG PO TABS
100.0000 mg | ORAL_TABLET | Freq: Three times a day (TID) | ORAL | Status: DC
  Administered 2023-12-20 – 2023-12-27 (×21): 100 mg via ORAL
  Filled 2023-12-20 (×9): qty 4
  Filled 2023-12-20: qty 8
  Filled 2023-12-20 (×8): qty 4
  Filled 2023-12-20: qty 8
  Filled 2023-12-20 (×3): qty 4

## 2023-12-20 MED ORDER — DIVALPROEX SODIUM 250 MG PO DR TAB
250.0000 mg | DELAYED_RELEASE_TABLET | Freq: Two times a day (BID) | ORAL | Status: DC
  Administered 2023-12-20 – 2023-12-27 (×15): 250 mg via ORAL
  Filled 2023-12-20 (×15): qty 1

## 2023-12-20 MED ORDER — OXYCODONE HCL 5 MG PO TABS
2.5000 mg | ORAL_TABLET | Freq: Two times a day (BID) | ORAL | Status: DC | PRN
  Administered 2023-12-20 – 2023-12-24 (×6): 2.5 mg via ORAL
  Filled 2023-12-20 (×6): qty 1

## 2023-12-20 MED ORDER — SULFAMETHOXAZOLE-TRIMETHOPRIM 400-80 MG PO TABS
1.0000 | ORAL_TABLET | Freq: Two times a day (BID) | ORAL | Status: DC
  Filled 2023-12-20: qty 1

## 2023-12-20 MED ORDER — SODIUM BICARBONATE 650 MG PO TABS
650.0000 mg | ORAL_TABLET | Freq: Three times a day (TID) | ORAL | Status: DC
  Administered 2023-12-20 – 2023-12-27 (×22): 650 mg via ORAL
  Filled 2023-12-20 (×23): qty 1

## 2023-12-20 MED ORDER — LACTATED RINGERS IV BOLUS: 1000.0000 mL | Freq: Once | INTRAVENOUS | Status: DC

## 2023-12-20 MED ORDER — PRAVASTATIN SODIUM 40 MG PO TABS
40.0000 mg | ORAL_TABLET | Freq: Every day | ORAL | Status: DC
  Administered 2023-12-20 – 2023-12-26 (×7): 40 mg via ORAL
  Filled 2023-12-20 (×7): qty 1

## 2023-12-20 MED ORDER — AMOXICILLIN-POT CLAVULANATE 500-125 MG PO TABS
1.0000 | ORAL_TABLET | Freq: Two times a day (BID) | ORAL | Status: DC
  Administered 2023-12-20 – 2023-12-21 (×2): 1 via ORAL
  Filled 2023-12-20 (×3): qty 1

## 2023-12-20 MED ORDER — PIPERACILLIN-TAZOBACTAM 3.375 G IVPB 30 MIN: 3.3750 g | Freq: Once | INTRAVENOUS | Status: DC

## 2023-12-20 MED ORDER — POLYETHYLENE GLYCOL 3350 17 GM/SCOOP PO POWD: 17.0000 g | Freq: Three times a day (TID) | ORAL | Status: DC

## 2023-12-20 MED ORDER — ACETAMINOPHEN 500 MG PO TABS
500.0000 mg | ORAL_TABLET | Freq: Four times a day (QID) | ORAL | Status: DC | PRN
  Administered 2023-12-20 – 2023-12-27 (×7): 1000 mg via ORAL
  Filled 2023-12-20 (×7): qty 2

## 2023-12-20 MED ORDER — ALBUTEROL SULFATE (2.5 MG/3ML) 0.083% IN NEBU
3.0000 mL | INHALATION_SOLUTION | RESPIRATORY_TRACT | Status: DC
  Administered 2023-12-20 – 2023-12-21 (×4): 3 mL via RESPIRATORY_TRACT
  Filled 2023-12-20 (×5): qty 3

## 2023-12-20 MED ORDER — AMLODIPINE BESYLATE 10 MG PO TABS
10.0000 mg | ORAL_TABLET | Freq: Every day | ORAL | Status: DC
  Administered 2023-12-20 – 2023-12-27 (×8): 10 mg via ORAL
  Filled 2023-12-20: qty 2
  Filled 2023-12-20 (×7): qty 1

## 2023-12-20 MED ORDER — PANTOPRAZOLE SODIUM 40 MG IV SOLR
40.0000 mg | Freq: Once | INTRAVENOUS | Status: AC
  Administered 2023-12-20: 40 mg via INTRAVENOUS
  Filled 2023-12-20: qty 10

## 2023-12-20 MED ORDER — DICLOFENAC SODIUM 1 % EX GEL: 2.0000 g | Freq: Four times a day (QID) | CUTANEOUS | Status: DC | PRN

## 2023-12-20 MED ORDER — ONDANSETRON HCL 4 MG/2ML IJ SOLN
4.0000 mg | Freq: Once | INTRAMUSCULAR | Status: AC
  Administered 2023-12-20: 4 mg via INTRAVENOUS
  Filled 2023-12-20: qty 2

## 2023-12-20 MED ORDER — BUDESON-GLYCOPYRROL-FORMOTEROL 160-9-4.8 MCG/ACT IN AERO
2.0000 | INHALATION_SPRAY | Freq: Two times a day (BID) | RESPIRATORY_TRACT | Status: DC
  Administered 2023-12-21 – 2023-12-27 (×13): 2 via RESPIRATORY_TRACT
  Filled 2023-12-20: qty 5.9

## 2023-12-20 MED ORDER — OLANZAPINE 5 MG PO TABS
15.0000 mg | ORAL_TABLET | Freq: Every day | ORAL | Status: DC
  Administered 2023-12-20 – 2023-12-26 (×7): 15 mg via ORAL
  Filled 2023-12-20 (×8): qty 1

## 2023-12-20 MED ORDER — ALLOPURINOL 100 MG PO TABS
50.0000 mg | ORAL_TABLET | Freq: Every morning | ORAL | Status: DC
  Administered 2023-12-20 – 2023-12-27 (×8): 50 mg via ORAL
  Filled 2023-12-20 (×8): qty 1

## 2023-12-20 NOTE — ED Notes (Signed)
 When you have time, Renay Arenas (son) [phone] would like an update on pt. Thank you

## 2023-12-20 NOTE — Assessment & Plan Note (Addendum)
 CKDIV - continue home sodium bicarb, avoid nephrotoxic agents.  pAF -  continue home eliquis , metoprolol , rate controlled here Schizophrenia - continue home olanzapine , depakote , could be part of hallucinations she mentioned Gout - continue home allopurinol  HTN - continue home amlodipine  OSA - Continue home CPAP at bedtime COPD - continue home breztri , albuterol  PRN

## 2023-12-20 NOTE — ED Provider Notes (Signed)
 [place] EMERGENCY DEPARTMENT AT [facility] Provider Note   CSN: [id number] Arrival date & time: 12/20/23  0004     Patient presents with: Emesis and Diarrhea   Abigail Wallace [facility] is a 78 y.o. female.   78 year old female presents ER today with vague abdominal pain, emesis and diarrhea for last couple days.  She states that today she is hallucinating.  She states she sees people that she knows are not there.  She is not hearing any hallucinations and no other hallucinations.  Denies alcohol  drugs or tobacco.  She states that her temperature at home was 98.9 which is really high for her since she is normally 96.  She also states that she had some blood in her emesis 1 time but it was after a nosebleed and no other blood, coffee-ground emesis or melena.  Also states she is a bit short of breath but she is on her baseline oxygen  and uses her nebulizer at home which seems to help.   Emesis Associated symptoms: diarrhea   Diarrhea Associated symptoms: vomiting        Prior to Admission medications   Medication Sig Start Date End Date Taking? Authorizing Provider  acetaminophen  (TYLENOL ) 500 MG tablet Take 500-1,000 mg by mouth every 6 (six) hours as needed for mild pain (pain score 1-3) or headache.   Yes [provider]  albuterol  (VENTOLIN  HFA) 108 (90 Base) MCG/ACT inhaler Inhale 2 puffs into the lungs every 6 (six) hours as needed for wheezing or shortness of breath. 11/11/23  Yes Delores Suzann HERO, MD  allopurinol  (ZYLOPRIM ) 100 MG tablet Take 0.5 tablets (50 mg total) by mouth every other day. Patient taking differently: Take 50 mg by mouth in the morning. 09/19/23  Yes Delores Suzann HERO, MD  amLODipine  (NORVASC ) 10 MG tablet Take 1 tablet (10 mg total) by mouth at bedtime. Patient taking differently: Take 10 mg by mouth daily. 10/11/23  Yes Delores Suzann HERO, MD  apixaban  (ELIQUIS ) 2.5 MG TABS tablet Take 1 tablet (2.5 mg total) by mouth 2 (two) times daily.  12/11/22  Yes Delores Suzann HERO, MD  cholecalciferol  (VITAMIN D3) 25 MCG (1000 UNIT) tablet Take 1 tablet (1,000 Units total) by mouth daily. 08/12/23  Yes Rumball, Alison M, DO  diclofenac  Sodium (VOLTAREN ) 1 % GEL Apply 2 g topically 4 (four) times daily as needed (pain). 08/19/23  Yes Delores Suzann HERO, MD  divalproex  (DEPAKOTE ) 250 MG DR tablet Take 1 tablet (250 mg total) by mouth every 12 (twelve) hours. 03/05/22  Yes Samtani, Jai-Gurmukh, MD  Fluticasone -Umeclidin-Vilant (TRELEGY ELLIPTA ) 100-62.5-25 MCG/ACT AEPB Inhale 1 puff into the lungs daily. 11/11/23  Yes Delores Suzann HERO, MD  furosemide  (LASIX ) 40 MG tablet Take 2 tablets (80 mg total) by mouth every other day. 12/05/23 12/04/24 Yes Delores Suzann HERO, MD  ipratropium-albuterol  (DUONEB) 0.5-2.5 (3) MG/3ML SOLN Take 3 mLs by nebulization every 4 (four) hours as needed. Patient taking differently: Take 3 mLs by nebulization See admin instructions. Nebulize 3 ml's and inhale into the lungs in the morning and evening- and an additional 3 ml's four times a day as needed for shortness of breath or wheezing 11/11/23  Yes Delores Suzann HERO, MD  metoprolol  tartrate (LOPRESSOR ) 100 MG tablet Take 1 tablet (100 mg total) by mouth 3 (three) times daily. 09/04/23  Yes Delores Suzann HERO, MD  Multiple Vitamins-Minerals (MULTIVITAMIN WOMEN 50+) TABS Take 1 tablet by mouth daily with breakfast.   Yes [provider]  nitroGLYCERIN  (NITROSTAT ) 0.4 MG SL tablet Place 1 tablet (0.4 mg total) under the tongue every 5 (five) minutes as needed for chest pain. 08/03/22 01/15/24 Yes Croitoru, Mihai, MD  nystatin  cream (MYCOSTATIN ) Apply 1 Application topically daily as needed (Rash). Patient taking differently: Apply 1 Application topically See admin instructions. Apply as directed to irritated areas of the right thigh and stomach in the morning and at bedtime 11/21/23  Yes Delores Suzann HERO, MD  nystatin  powder Apply 1 Application topically daily. Apply after  bathing. Patient taking differently: Apply 1 Application topically See admin instructions. Apply as directed to irritated areas of the right thigh and stomach in the morning and at bedtime 10/04/23  Yes Delores Suzann HERO, MD  OLANZapine  (ZYPREXA ) 10 MG tablet Take 15 mg by mouth at bedtime. 05/17/23  Yes [provider]  OVER THE COUNTER MEDICATION Apply 1 application  topically See admin instructions. Caldesene Medicated Protecting Powder- Apply under the breasts and between the skin folds 2 times a day as needed for irritation   Yes [provider]  oxyCODONE  (ROXICODONE ) 5 MG immediate release tablet Take 0.5 tablets (2.5 mg total) by mouth every 12 (twelve) hours as needed for severe pain (pain score 7-10). 12/19/23  Yes Delores Suzann HERO, MD  polyethylene glycol powder (GLYCOLAX /MIRALAX ) 17 GM/SCOOP powder Take 17 g by mouth 3 (three) times daily. 12/11/22  Yes Delores Suzann HERO, MD  pravastatin  (PRAVACHOL ) 40 MG tablet Take 1 tablet (40 mg total) by mouth every evening. Patient taking differently: Take 40 mg by mouth at bedtime. 09/25/23  Yes Croitoru, Mihai, MD  Semaglutide ,0.25 or 0.5MG /DOS, 2 MG/1.5ML SOPN Inject 0.25 mg into the skin once a week. Patient taking differently: Inject 0.25 mg into the skin every Tuesday. 10/11/23  Yes Delores Suzann HERO, MD  senna (SENOKOT) 8.6 MG TABS tablet Take 1 tablet (8.6 mg total) by mouth daily as needed for mild constipation. Patient taking differently: Take 1 tablet by mouth daily. 05/16/23  Yes Delores Suzann HERO, MD  sodium bicarbonate  650 MG tablet Take 1 tablet (650 mg total) by mouth 3 (three) times daily. 12/05/23  Yes Delores Suzann HERO, MD  triamcinolone  ointment (KENALOG ) 0.5 % Apply 1 Application topically 2 (two) times daily as needed (skin irritation). 10/04/23  Yes Delores Suzann HERO, MD  glucose blood (ACCU-CHEK GUIDE TEST) test strip Use to check blood glucose once daily and as needed for symptoms 11/08/23   Delores Suzann HERO, MD  Lancets  (FREESTYLE) lancets Use to check blood glucose once daily and as needed for symptoms 11/08/23   Delores Suzann HERO, MD    Allergies: Abilify [aripiprazole], Keflex  [cephalexin ], Versacloz  [clozapine ], Ativan  [lorazepam ], Benadryl [diphenhydramine hcl], Benztropine , Latuda [lurasidone hcl], Remeron [mirtazapine], Haldol  [haloperidol  lactate], Codeine, and Latex    Review of Systems  Gastrointestinal:  Positive for diarrhea and vomiting.    Updated Vital Signs BP (!) 152/71   Pulse (!) 59   Temp 98.1 F (36.7 C) (Oral)   Resp 14   Wt (!) 147.6 kg Comment: Wt from 11/26/2023  SpO2 97%   BMI 52.52 kg/m   Physical Exam Vitals and nursing note reviewed.  Constitutional:      Appearance: She is well-developed.  HENT:     Head: Normocephalic and atraumatic.  Cardiovascular:     Rate and Rhythm: Normal rate and regular rhythm.  Pulmonary:     Effort: No respiratory distress.     Breath sounds: No stridor. Rales present.  Abdominal:  General: There is no distension.  Musculoskeletal:     Cervical back: Normal range of motion.  Skin:    General: Skin is warm and dry.  Neurological:     Mental Status: She is alert.     (all labs ordered are listed, but only abnormal results are displayed) Labs Reviewed  CBC WITH DIFFERENTIAL/PLATELET - Abnormal; Notable for the following components:      Result Value   WBC 11.7 (*)    RBC 2.97 (*)    Hemoglobin 8.9 (*)    HCT 27.7 (*)    Neutro Abs 7.8 (*)    Monocytes Absolute 1.3 (*)    Abs Immature Granulocytes 0.41 (*)    All other components within normal limits  COMPREHENSIVE METABOLIC PANEL WITH GFR - Abnormal; Notable for the following components:   Sodium 134 (*)    Glucose, Bld 199 (*)    BUN 32 (*)    Creatinine, Ser 2.83 (*)    Total Protein 6.2 (*)    Albumin 2.6 (*)    AST 14 (*)    GFR, Estimated 16 (*)    All other components within normal limits  URINALYSIS, ROUTINE W REFLEX MICROSCOPIC - Abnormal; Notable for the  following components:   APPearance CLOUDY (*)    Glucose, UA >=500 (*)    Protein, ur >=300 (*)    Nitrite POSITIVE (*)    Leukocytes,Ua MODERATE (*)    Bacteria, UA MANY (*)    All other components within normal limits  BRAIN NATRIURETIC PEPTIDE - Abnormal; Notable for the following components:   B Natriuretic Peptide 718.3 (*)    All other components within normal limits  C DIFFICILE QUICK SCREEN W PCR REFLEX    GASTROINTESTINAL PANEL BY PCR, STOOL (REPLACES STOOL CULTURE)  CULTURE, BLOOD (ROUTINE X 2)  CULTURE, BLOOD (ROUTINE X 2)  URINE CULTURE  LIPASE, BLOOD  I-STAT CG4 LACTIC ACID, ED  I-STAT CG4 LACTIC ACID, ED  TROPONIN I (HIGH SENSITIVITY)  TROPONIN I (HIGH SENSITIVITY)    EKG: None  Radiology: CT HEAD WO CONTRAST ( ) Result Date: 12/20/2023 EXAM: CT HEAD WITHOUT CONTRAST 12/20/2023 06:03:05 AM TECHNIQUE: CT of the head was performed without the administration of intravenous contrast. Automated exposure control, iterative reconstruction, and/or weight based adjustment of the mA/kV was utilized to reduce the radiation dose to as low as reasonably achievable. COMPARISON: 02/28/2022 CLINICAL HISTORY: Mental status change, unknown cause FINDINGS: BRAIN AND VENTRICLES: No acute hemorrhage. No evidence of acute infarct. Prominence of the sulci and ventricles compatible with brain atrophy. Unchanged appearance of small left frontal meningioma measuring 8 mm, image 33/6. No hydrocephalus. No extra-axial collection. No mass effect or midline shift. ORBITS: No acute abnormality. SINUSES: No acute abnormality. SOFT TISSUES AND SKULL: No acute soft tissue abnormality. No skull fracture. IMPRESSION: 1. No acute intracranial abnormality. 2. Unchanged small left frontal meningioma measuring 8 mm. 3. Prominent sulci and ventricles compatible with brain atrophy. Electronically signed by: Waddell Calk MD 12/20/2023 06:10 AM EST RP Workstation: [id number]   DG Chest Portable 1 View Result  Date: 12/20/2023 CLINICAL DATA:  Nausea, vomiting and diarrhea. EXAM: PORTABLE CHEST 1 VIEW COMPARISON:  November 24, 2023 FINDINGS: There is stable dual lead AICD lead positioning. The cardiac silhouette is enlarged and unchanged in size. There is marked severity calcification of the thoracic aorta. Mild prominence of the pulmonary vasculature is seen. Diffuse, chronic appearing increased interstitial lung markings are noted. No focal consolidation, pleural effusion or pneumothorax is  identified. The visualized skeletal structures are unremarkable. IMPRESSION: Cardiomegaly with mild pulmonary vascular congestion. Electronically Signed   By: Suzen Dials M.D.   On: 12/20/2023 01:07     .Critical Care  Performed by: Lorette Mayo, MD Authorized by: Lorette Mayo, MD   Critical care provider statement:    Critical care time (minutes):  30   Critical care was time spent personally by me on the following activities:  Development of treatment plan with patient or surrogate, discussions with consultants, evaluation of patient's response to treatment, examination of patient, ordering and review of laboratory studies, ordering and review of radiographic studies, ordering and performing treatments and interventions, pulse oximetry, re-evaluation of patient's condition and review of old charts                                      Medical Decision Making Amount and/or Complexity of Data Reviewed Labs: ordered. Radiology: ordered. ECG/medicine tests: ordered.  Risk Prescription drug management. Decision regarding hospitalization.   Patient with slightly elevated BNP consistent with likely acute on chronic heart failure.  No increased oxygen  requirement and not significantly tachypneic or in respiratory distress to be concern for need for BiPAP.  She does have what appears to be likely UTI which could be related to the nausea and hallucinations, cultures sent.  Discussed with pharmacy secondary  to multiple allergies and her kidney function settled on Zosyn  for now until speciated and sensitivities  Discussed with family medicine for admission.   Final diagnoses:  Hallucinations  Acute cystitis without hematuria    ED Discharge Orders     None          Valynn Schamberger, Mayo, MD 12/20/23 2333

## 2023-12-20 NOTE — Assessment & Plan Note (Addendum)
 Afebrile. VSS. Urine culture in progress. Blood culture in progress. Past cultures this year with resistance to cipro , macrobid, augmentin . Cephalosporin allergy. Creatinine currently around baseline, 2.83 today. Wbc 11.7, neutrophil 7.8.  - Continue bactrim , pending current culture sensitivities - avoid fluids despite bactrim  given CHF - follow cultures - AM BMP, Mg, CBC

## 2023-12-20 NOTE — ED Notes (Signed)
 Patient transported to CT

## 2023-12-20 NOTE — Progress Notes (Signed)
 PHARMACY - PHYSICIAN COMMUNICATION CRITICAL VALUE ALERT - BLOOD CULTURE IDENTIFICATION (BCID)  Abigail Wallace [place] is an 78 y.o. female who presented to [facility] on 12/20/2023 with a chief complaint of AMS.  Assessment:  Bcx 2/4 anaerobic only growing strep species, no resistance. Patient with tongue swelling to cephalexin  but tolerated augmentin  and zosyn  Sept 2025.  Name of physician (or Provider) Contacted: Dr. Fairy Amy  Current antibiotics: bactrim   Changes to prescribed antibiotics recommended: to augmentin . Recommendations accepted by provider  Results for orders placed or performed during the hospital encounter of 12/20/23  Blood Culture ID Panel (Reflexed) (Collected: 12/20/2023  1:03 AM)  Result Value Ref Range   Enterococcus faecalis NOT DETECTED NOT DETECTED   Enterococcus Faecium NOT DETECTED NOT DETECTED   Listeria monocytogenes NOT DETECTED NOT DETECTED   Staphylococcus species NOT DETECTED NOT DETECTED   Staphylococcus aureus (BCID) NOT DETECTED NOT DETECTED   Staphylococcus epidermidis NOT DETECTED NOT DETECTED   Staphylococcus lugdunensis NOT DETECTED NOT DETECTED   Streptococcus species DETECTED (A) NOT DETECTED   Streptococcus agalactiae NOT DETECTED NOT DETECTED   Streptococcus pneumoniae NOT DETECTED NOT DETECTED   Streptococcus pyogenes NOT DETECTED NOT DETECTED   A.calcoaceticus-baumannii NOT DETECTED NOT DETECTED   Bacteroides fragilis NOT DETECTED NOT DETECTED   Enterobacterales NOT DETECTED NOT DETECTED   Enterobacter cloacae complex NOT DETECTED NOT DETECTED   Escherichia coli NOT DETECTED NOT DETECTED   Klebsiella aerogenes NOT DETECTED NOT DETECTED   Klebsiella oxytoca NOT DETECTED NOT DETECTED   Klebsiella pneumoniae NOT DETECTED NOT DETECTED   Proteus species NOT DETECTED NOT DETECTED   Salmonella species NOT DETECTED NOT DETECTED   Serratia marcescens NOT DETECTED NOT DETECTED   Haemophilus influenzae NOT DETECTED NOT DETECTED    Neisseria meningitidis NOT DETECTED NOT DETECTED   Pseudomonas aeruginosa NOT DETECTED NOT DETECTED   Stenotrophomonas maltophilia NOT DETECTED NOT DETECTED   Candida albicans NOT DETECTED NOT DETECTED   Candida auris NOT DETECTED NOT DETECTED   Candida glabrata NOT DETECTED NOT DETECTED   Candida krusei NOT DETECTED NOT DETECTED   Candida parapsilosis NOT DETECTED NOT DETECTED   Candida tropicalis NOT DETECTED NOT DETECTED   Cryptococcus neoformans/gattii NOT DETECTED NOT DETECTED   Jinnie Door, PharmD, BCPS, BCCP Clinical Pharmacist  Please check AMION for all [facility] phone numbers After 10:00 PM, call Main Pharmacy [phone]

## 2023-12-20 NOTE — ED Triage Notes (Signed)
 EMS reports N/V/D for last several days. Reports come abdominal discomfort.

## 2023-12-20 NOTE — Assessment & Plan Note (Addendum)
 Patient endorsing increased dyspnea with elevated BNP and evidence of pulmonary congestion on CXR. Stable sats on 2L O2 which she was on at home PTA. -40mg  IV Lasix  x 1 (home dose 80 mg lasix  every couple days per patient), redose pending output given her CKD4 -Strict I/Os -Daily weights -Continue 2L [place] baseline O2 -PT/OT eval and treat to aid in mobilization

## 2023-12-20 NOTE — Assessment & Plan Note (Signed)
 Patient with 2-3 weeks of dizziness. CT head without evidence of acute/subacute stroke. Patient with history of TD that explains her tremulous jaw and finger-to-nose testing. From chart review, appears her facial numbness is a chronic concern. Considered a brain MRI in addition to CT head, but her pacemaker in incompatible, and given subacute to chronic nature of complaints, would expect a stroke to appear on CT head. Will continue to monitor.

## 2023-12-20 NOTE — Telephone Encounter (Signed)
 Noted and appreciate inpatient team care.  Suzann Daring, MD  Family Medicine Teaching Service

## 2023-12-20 NOTE — Progress Notes (Signed)
 Came to room x 2 for breathing tx.  Currently pt is unavailable d/t procedure.

## 2023-12-20 NOTE — Assessment & Plan Note (Addendum)
 Difficult to assess volume status on exam given patient body habitus. Additionally, expiratory wheeze and prolonged expiratory phase concerning for COPD exacerbation as alternative or additional source of SOB.  - echocardiogram - continue lasix  40 mg IV daily - schedule albuterol  nebs q4h - start prednisone  40 mg 5 days - continue scheduled breztri  inhaler - strict I/Os - daily weights - 2L [place] (home) for O2 sats 88-92% - CPAP at night

## 2023-12-20 NOTE — TOC Initial Note (Signed)
 Transition of Care [facility]) - Initial/Assessment Note    Patient Details  Name: Abigail Wallace MRN: [id number] Date of Birth: September 29, 1945  [facility]) CM/SW Contact:    Abigail Barnie Rama, RN Phone Number: 12/20/2023, 3:01 PM  Clinical Narrative:                 From home with son, (Abigail Wallace), has PCP  and has a home follow up on 12/30 with Dr. Delores per her Dr's office and she does not like to see any other doctor other than Dr. Delores and has insurance on file, states has no HH services in place at this time , but has an aide with [facility] Mon - Fri from 7a- 3 pm and 3pm - 7 pm , has home oxygen  2 liters with [employer], and she gets diapers thru [employer] at home.  States she will need ambulance transport  at [employer] and family is support system, states gets medications from [employer] and [facility].    Pta bedbound. ICM will follow for dc needs.    Expected Discharge Plan: Home w Home Health Services Barriers to Discharge: Continued Medical Work up   Patient Goals and CMS Choice Patient states their goals for this hospitalization and ongoing recovery are:: return home   Choice offered to / list presented to : NA      Expected Discharge Plan and Services   Discharge Planning Services: CM Consult Post Acute Care Choice: NA Living arrangements for the past 2 months: Single Family Home                 DME Arranged: N/A DME Agency: NA       HH Arranged: NA          Prior Living Arrangements/Services Living arrangements for the past 2 months: Single Family Home Lives with:: Adult Children (son, Abigail Wallace) Patient language and need for interpreter reviewed:: Yes Do you feel safe going back to the place where you live?: Yes      Need for Family Participation in Patient Care: Yes (Comment) Care giver support system in place?: Yes (comment) Current home services: DME, Homehealth aide (home oxygen  2 liters with [employer], has aide with [facility]) Criminal  Activity/Legal Involvement Pertinent to Current Situation/Hospitalization: No - Comment as needed  Activities of Daily Living      Permission Sought/Granted Permission sought to share information with : Case Manager Permission granted to share information with : Yes, Verbal Permission Granted              Emotional Assessment Appearance:: Appears stated age Attitude/Demeanor/Rapport: Engaged Affect (typically observed): Appropriate Orientation: : Oriented to Self, Oriented to Place, Oriented to  Time, Oriented to Situation Alcohol  / Substance Use: Not Applicable Psych Involvement: No (comment)  Admission diagnosis:  Hallucinations [R44.3] Altered mental status [R41.82] Acute cystitis without hematuria [N30.00] Patient Active Problem List   Diagnosis Date Noted   Altered mental status 12/20/2023   UTI (urinary tract infection) 12/20/2023   Acute on chronic diastolic heart failure (HCC) 12/20/2023   Chronic kidney disease (CKD), stage 4 (HCC) 12/20/2023   Dizziness 12/20/2023   Lumbar pain 11/25/2023   Anemia 11/24/2023   Indwelling catheter present on admission 11/24/2023   Urinary tract infection associated with indwelling urethral catheter 09/26/2023   Heart failure with preserved ejection fraction (HCC) 08/09/2023   Chronic health problem 06/14/2023   Acute on chronic heart failure (HCC) 06/14/2023   Meningioma (HCC) 04/06/2022  Acute on chronic congestive heart failure (HCC) 02/27/2022   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   Cholesteatoma 12/24/2021   Advanced care planning/counseling discussion 09/29/2021   Chronic back pain 11/14/2018   Pulmonary nodules 03/03/2018   Paroxysmal atrial fibrillation (HCC) s/p cardioversion  01/28/2018   Dependence on wheelchair 05/17/2017   Abdominal aortic ectasia 01/16/2017   Renal cyst 01/16/2017   CKD stage IIIb 09/12/2016   Well controlled type 2 diabetes mellitus (HCC) 03/20/2012   Gout 09/26/2011   Essential  hypertension 04/26/2011   Tardive dyskinesia 04/26/2011   Artificial cardiac pacemaker 03/30/2010   Vaginal prolapse 03/27/2010   Obstructive sleep apnea 03/27/2010   Osteoarthritis of right knee 03/27/2010   Resting tremor 12/15/2009   Sick sinus syndrome (HCC) 04/24/2006   Schizophrenia (HCC) 03/14/2006   Chronic obstructive pulmonary disease (HCC) 03/14/2006   PCP:  Abigail Suzann HERO, MD Pharmacy:   [facility] - [place], [place] - [address] [place] [address] Phone: [phone] Fax: [phone]  [facility] [address] [place] [address] Phone: [phone] Fax: [phone]     Social Drivers of Health (SDOH) Social History: SDOH Screenings   Food Insecurity: No Food Insecurity (11/24/2023)  Housing: Low Risk  (11/24/2023)  Transportation Needs: No Transportation Needs (11/24/2023)  Utilities: Not At Risk (11/24/2023)  Alcohol  Screen: Low Risk  (12/27/2020)  Depression (PHQ2-9): High Risk (09/30/2023)  Financial Resource Strain: Low Risk  (03/23/2022)  Physical Activity: Inactive (12/27/2020)  Social Connections: Socially Isolated (11/24/2023)  Stress: No Stress Concern Present (12/27/2020)  Tobacco Use: Medium Risk (12/20/2023)   SDOH Interventions:     Readmission Risk Interventions    12/20/2023    2:55 PM 11/26/2023   11:22 AM 06/19/2023    9:04 AM  Readmission Risk Prevention Plan  Transportation Screening Complete Complete Complete  PCP or Specialist Appt within 5-7 Days   Complete  PCP or Specialist Appt within 3-5 Days  Complete   Home Care Screening   Complete  Medication Review (RN CM)   Referral to Pharmacy  HRI or Home Care Consult  Complete   Social Work Consult for Recovery Care Planning/Counseling  Complete   Palliative Care Screening  Not Applicable   Medication Review Oceanographer) Complete Referral to Pharmacy   PCP or Specialist appointment within 3-5  days of discharge Complete    HRI or Home Care Consult Complete    Palliative Care Screening Not Applicable

## 2023-12-20 NOTE — Progress Notes (Signed)
 Daily Progress Note Intern Pager: [phone]  Patient name: Abigail Wallace [facility] Medical record number: [id number] Date of birth: 1946/01/15 Age: 78 y.o. Gender: female  Primary Care Provider: Delores Suzann HERO, MD Consultants: None Code Status: full  Pt Overview and Major Events to Date:  12/5 - admitted   Assessment and Plan:  This is an 78 year old female with PMH of CKD, pAfib, schizophrenia, HTN, OSA, COPD who is admitted with AMS, dysuria, SOB, n/v secondary to suspected UTI and CHF exacerbation. Further differentials and treatment detailed with each problem below. Assessment & Plan Altered mental status UTI (urinary tract infection) CKD stage IIIb Indwelling catheter present on admission Afebrile. VSS. Urine culture in progress. Blood culture in progress. Past cultures this year with resistance to cipro , macrobid, augmentin . Cephalosporin allergy. Creatinine currently around baseline, 2.83 today. Wbc 11.7, neutrophil 7.8.  - Continue bactrim , pending current culture sensitivities - avoid fluids despite bactrim  given CHF - follow cultures - AM BMP, Mg, CBC Acute on chronic diastolic heart failure (HCC) Chronic obstructive pulmonary disease (HCC) Obstructive sleep apnea Difficult to assess volume status on exam given patient body habitus. Additionally, expiratory wheeze and prolonged expiratory phase concerning for COPD exacerbation as alternative or additional source of SOB.  - echocardiogram - continue lasix  40 mg IV daily - schedule albuterol  nebs q4h - start prednisone  40 mg 5 days - continue scheduled breztri  inhaler - strict I/Os - daily weights - 2L [place] (home) for O2 sats 88-92% - CPAP at night Artificial cardiac pacemaker Dizziness Patient with 2-3 weeks of dizziness. CT head without evidence of acute/subacute stroke. Patient with history of TD that explains her tremulous jaw and finger-to-nose testing. From chart review, appears her facial numbness is a  chronic concern. Considered a brain MRI in addition to CT head, but her pacemaker in incompatible, and given subacute to chronic nature of complaints, would expect a stroke to appear on CT head. Will continue to monitor.  Chronic health problem CKDIV - continue home sodium bicarb, avoid nephrotoxic agents.  pAF -  continue home eliquis , metoprolol , no afib on telemetry Schizophrenia - continue home olanzapine , depakote , could be part of hallucinations she mentioned Gout - continue home allopurinol  HTN - continue home amlodipine   FEN/GI: renal PPx: eliquis  Dispo:Pending PT recommendations  pending clinical improvement .   Subjective:  Patient reports 2-3 weeks of dizziness. Also, back pain. No BM in the last day.   Objective: Temp:  [98.1 F (36.7 C)] 98.1 F (36.7 C) (12/05 0400) Pulse Rate:  [59-63] 59 (12/05 0500) Resp:  [14-24] 14 (12/05 0500) BP: (131-153)/(64-71) 152/71 (12/05 0500) SpO2:  [92 %-97 %] 97 % (12/05 0500) Weight:  [147.6 kg] 147.6 kg (12/05 0400) Physical Exam: General: well appearing woman lying in bed HEENT: tremulous jaw Neuro: bilateral face numbness, PERRL, decreased peripheral vision, preserved central vision, difficulty tracking EOM suspect due to peripheral vision deficits, decreased sensation in the LUE compared to RUE, decreased sensation symmetrically in BLE, symmetric 4/5 weakness in the BUE, symmetric 3+/5 with straight leg raise Cardiovascular: RRR, unable to appreciate JVD or HJR but neck is obese, no pretibial edema, no abdominal ascites Respiratory: expiratory wheeze, prolonged expiratory phase Abdomen: nontender, large panus  Laboratory: Most recent CBC Lab Results  Component Value Date   WBC 11.7 (H) 12/20/2023   HGB 8.9 (L) 12/20/2023   HCT 27.7 (L) 12/20/2023   MCV 93.3 12/20/2023   PLT 206 12/20/2023   Most recent BMP  Latest Ref Rng & Units 12/20/2023   12:11 AM  BMP  Glucose 70 - 99 mg/dL 800   BUN 8 - 23 mg/dL 32    Creatinine 9.55 - 1.00 mg/dL 7.16   Sodium 864 - 854 mmol/L 134   Potassium 3.5 - 5.1 mmol/L 3.7   Chloride 98 - 111 mmol/L 99   CO2 22 - 32 mmol/L 23   Calcium  8.9 - 10.3 mg/dL 9.3    Imaging/Diagnostic Tests: Ct head 1. No acute intracranial abnormality. 2. Unchanged small left frontal meningioma measuring 8 mm. 3. Prominent sulci and ventricles compatible with brain atrophy.  Chest XR Cardiomegaly with mild pulmonary vascular congestion.   Alena Morrison, Bruce, MD 12/20/2023, 7:27 AM  PGY-1, [facility] Family Medicine FPTS Intern pager: [phone], text pages welcome Secure chat group [facility] Teaching Service

## 2023-12-20 NOTE — Progress Notes (Signed)
 PT Cancellation Note  Patient Details Name: Abigail Wallace MRN: [id number] DOB: 1946-01-12   Cancelled Treatment:    Reason Eval/Treat Not Completed: Patient at procedure or test/unavailable (plan to follow up when able)  Leontine Hilt DPT Acute Rehab Services [phone] Prefer contact via chat   Leontine NOVAK Antha Niday 12/20/2023, 2:44 PM

## 2023-12-20 NOTE — Assessment & Plan Note (Addendum)
 Patient presents w/ n/v/d, suprapubic pain and AMS though on our exam she is A&Ox3. UA w/ leuks, nitrites and bacteria, of note she is chronically catheterized at baseline. Multiple recent prior Ucx showing sensitivity to TMP/SMX. Reassuringly no fever. Feel pyelo is less likely at this time based on overall clinical presentation. -Admit to FMTS, attending Dr McDiarmid -For complicated UTI w/ GFR 15-30, will proceed with bactrim  1 DS tablet follow by 1 SS tablet 12 hours later for total of 7 days, caution with her renal disease but unfortunately this is the best oral option -Ucx pending after replacement of urinary catheter -Bcx pending -CT head w/o contrast to r/o stroke -ED ordered GI panel and C. Diff testing for n/v/d, can d/c if patient's symptoms improve with abx therapy -Continue home tylenol , oxycodone  for pain -AM BMP, CBC

## 2023-12-20 NOTE — H&P (Addendum)
 Hospital Admission History and Physical Service Pager: [phone]  Patient name: Abigail Wallace [facility] Medical record number: [id number] Date of Birth: 11/17/1945 Age: 78 y.o. Gender: female  Primary Care Provider: Suzann Daring, MD Consultants: None Code Status: FULL Preferred Emergency Contact:   Contact Information     Name Relation Home Work [employer] Son [phone]  [phone]      Other Contacts     Name Relation Home Work Mobile   [name],[name] Son [phone]  [phone]      Chief Complaint: AMS  Differential and Medical Decision Making:  Imberly Troxler [facility] is a 78 y.o. female presenting with AMS, dysuria, and n/v/d.  Differential for this patient's presentation of this includes UTI (flank pain, burning around catheter site, n/v, UA w/ leuks, nitrites, bacteria, leukocytosis), stroke (slurred speech, reports difficulty writing, hallucinations, vision change though reassuringly A&Ox3 and with reassuring neurologic exam), and hypercarbia (elevated BNP and signs of pulmonary congestion on CXR).  Assessment & Plan Altered mental status UTI (urinary tract infection) Patient presents w/ n/v/d, suprapubic pain and AMS though on our exam she is A&Ox3. UA w/ leuks, nitrites and bacteria, of note she is chronically catheterized at baseline. Multiple recent prior Ucx showing sensitivity to TMP/SMX. Reassuringly no fever. Feel pyelo is less likely at this time based on overall clinical presentation. -Admit to FMTS, attending Dr McDiarmid -For complicated UTI w/ GFR 15-30, will proceed with bactrim  1 DS tablet follow by 1 SS tablet 12 hours later for total of 7 days, caution with her renal disease but unfortunately this is the best oral option -Ucx pending after replacement of urinary catheter -Bcx pending -CT head w/o contrast to r/o stroke -ED ordered GI panel and C. Diff testing for n/v/d, can d/c if patient's symptoms improve with abx  therapy -Continue home tylenol , oxycodone  for pain -AM BMP, CBC CHF exacerbation (HCC) Patient endorsing increased dyspnea with elevated BNP and evidence of pulmonary congestion on CXR. Stable sats on 2L O2 which she was on at home PTA. -40mg  IV Lasix  x 1 (home dose 80 mg lasix  every couple days per patient), redose pending output given her CKD4 -Strict I/Os -Daily weights -Continue 2L [place] baseline O2 -PT/OT eval and treat to aid in mobilization Chronic health problem CKDIV - continue home sodium bicarb, avoid nephrotoxic agents.  pAF -  continue home eliquis , metoprolol , rate controlled here Schizophrenia - continue home olanzapine , depakote , could be part of hallucinations she mentioned Gout - continue home allopurinol  HTN - continue home amlodipine  OSA - Continue home CPAP at bedtime COPD - continue home breztri , albuterol  PRN  FEN/GI: Renal carb modified VTE Prophylaxis: Home eliquis   Disposition: MedTele  History of Present Illness:  Abigail Wallace is a 78 y.o. female presenting with AMS and n/v/d.   She comes into the hospital with 3-4 days of vomiting with some blood. She notes she has also had nose bleeding. She also has had watery stools. She has abdominal pain in the suprapubic area that goes around to her right flank. She endorses burning around her chronic indwelling catheter. Everything she eats comes right through her. She has had nausea, dizziness, and SOB with this. No significant cough. She has had a runny nose.  Her son called 911 and sent her to the ED tonight given concern for her oxygenation and pain. Her BP has also been higher at home. She has been using 2L of O2 at home to keep her O2  sats in the 90s; otherwise, the sats were in the high 80s. She has also had a temperature of around 100 max. She reports her temperature usually runs low so she takes a temperature around 99 to mean that she has a fever.   She has been taking all of her medicine. She has  been taking oxycodone  for pain per her PCP without much relief.   Of note, patient states she has been seeing people in her house that was not her son. She does have a history of schizophrenia. She also notes difficulty writing yesterday and worsening slurred speech. She said her son begged her to come to the ED yesterday, but she wanted to go home. She reports long standing problems with her vision that she thinks may have gotten a bit worse over the last couple of days. She endorses slurred speech, which she notes is worse today than at baseline.   In the ED, she feels better after having oxygen  and medications. She is afebrile w/ VSS, satting mid 90s on 2L [place]. CXR suggestive of volume overload.   Review Of Systems: Per HPI.  Pertinent Past Medical History: A-fib CHF CKD 3B COPD CAD GERD Gout HTN Memory difficulties Pacemaker Paranoid schizophrenia Sick sinus syndrome, s/p pacemaker Tardive dyskinesia T2DM Remainder reviewed in history tab.   Pertinent Past Surgical History: Cardioversion 2023 Cardiac catheterization 2007 Pacemaker placement 2007 Cholecystectomy  Remainder reviewed in history tab.   Pertinent Social History: Tobacco use: Former, quit in 2024, smoked on and off for 60+ years Alcohol  use: None Other Substance use: None Lives with son [name]  Pertinent Family History: Mother: Heart disease Father: Heart disease Sister: Stroke Son: T2DM.  Important Outpatient Medications: Albuterol  as needed Allopurinol  50 mg every other day Amlodipine  10 mg daily Eliquis  2.5 mg twice daily Depakote  250 mg twice daily Trelegy 1 puff daily Lasix  60 daily Metoprolol  tartrate 100 3 times daily Olanzapine  10 mg nightly Pravastatin  40 daily Ozempic  0.5 mg weekly Sodium bicarb 650 mg 3 times dail  Objective: BP (!) 147/70   Pulse 63   Temp 98.1 F (36.7 C) (Oral)   Resp 18   Wt (!) 147.6 kg Comment: Wt from 11/26/2023  SpO2 96%   BMI 52.52 kg/m   Exam: General: Awake, alert, NAD. Conversing fully, speech slurred.  Cardio: RRR. Normal S1, S2. No murmur, rub, gallop. 2+ radial and dorsalis pedis pulses b/l w/ good capillary refill. No LE edema.  Resp: Crackles in b/l bases. No wheezes appreciated. Normal work of breathing on 2L .  Abdomen: soft, non-distended abdomen with extensive pannus. TTP over suprapubic area and R abdomen. No guarding, rigidity, or rebound.  Neuro: AOx3. Cranial nerves II-XII intact. No gross motor deficits, LE strength testing limited by mobility w/ at least 3/5 b/l. Sensation to light touch intact and symmetrical bilaterally. Psych: Appropriate mood and affect, good insight.   Labs:  CBC BMET  Recent Labs  Lab 12/20/23 0011  WBC 11.7*  HGB 8.9*  HCT 27.7*  PLT 206   Recent Labs  Lab 12/20/23 0011  NA 134*  K 3.7  CL 99  CO2 23  BUN 32*  CREATININE 2.83*  GLUCOSE 199*  CALCIUM  9.3    Lipase 31 BNP 718 Trop 17, 16 Lactate 1.2 UA w/ >500 glucose, moderate leuks, positive nitrites, >300 protein, many bacteria, ucx pending Bcx pending   EKG: Wide complex, paced idioventricular rhythm at 60bmp.   Imaging Studies Performed:  CXR: Impression from Radiologist: Cardiomegaly  with mild pulmonary vascular congestion. My Interpretation: Cardiomegaly stable vs prior w/ vascular congestion and R sided pleural effusion.  Manon Jester, DO 12/20/2023, 6:02 AM PGY-1, [place] [facility]  FPTS Intern pager: [phone], text pages welcome Secure chat group [facility] Teaching Service   I agree with the assessment and plan as documented above.  Stuart Redo, MD PGY-3, [facility]

## 2023-12-20 NOTE — Assessment & Plan Note (Addendum)
 CKDIV - continue home sodium bicarb, avoid nephrotoxic agents.  pAF -  continue home eliquis , metoprolol , no afib on telemetry Schizophrenia - continue home olanzapine , depakote , could be part of hallucinations she mentioned Gout - continue home allopurinol  HTN - continue home amlodipine 

## 2023-12-20 NOTE — Hospital Course (Addendum)
 78 year old female with PMH of CKD, pAfib, schizophrenia, HTN, OSA, COPD, CHF who was admitted to the [facility] family medicine teaching service with UTI, CHF exacerbation, and COPD exacerbation.  Her hospital course is detailed below:  UTI AMS Patient admitted with nausea vomiting, dysuria, suprapubic pain and reported altered mental status though appropriately oriented at time of admission.  UA with leukocytes, nitrites, and bacteria.  Patient started on p.o. Bactrim  (12/5), given past UTI resistance patterns. Further antibiotics as below, per bacteremia section. Urine culture grew klebsiella pneumonia with sensitivities to everything but ampicillin and Unasyn.  Notably, patient reported dizziness and vision changes for several weeks ongoing during her admission.  CT head without acute abnormality, but with findings of brain atrophy.  Pacemaker incompatible with MRI, and given duration of symptoms, felt CT head appropriately ruled out stroke.   Bacteremia  SSS s/p Pacemaker Blood cultures grew strep mitis/oralis 4/4 cultures and antibiotic regimen transitioned from bactrim  to PO Augmentin  (12/5-12/6). Further bl cx growth with streptococcus mitas/oralis and pansensitive, concerning for endocarditis. Patient transitioned to IV CTX (12/6-***) and I/D contacted. TEE revealed no evidence of vegetation, EP was consulted regarding pacemaker ***.   CHF exacerbation Given patient reported dyspnea, BNP elevated at 718, and pulmonary congestion noted on chest x-ray, patient diuresed with IV Lasix .  Patient had good urine output and kidney function was trended, pt was transitioned to oral diuretics and returned to dry weight ***.  COPD exacerbation Patient also with expiratory wheeze and increased expiratory phase in association with dyspnea concerning for COPD exacerbation.  Home inhaler continued and albuterol  nebulizers scheduled.  Oral prednisone  started complete a 5-day course (12/5-12/10).  PCP follow  up recommendations: Ensure 3 year ultrasound follow up on 3.9 cm infrarenal AAA Consider renal mass protocol CT or MRI for multiple renal cortical lesions

## 2023-12-20 NOTE — Progress Notes (Signed)
   12/20/23 2100  BiPAP/CPAP/SIPAP  Reason BIPAP/CPAP not in use Non-compliant (Pt. refused CPAP. Educated pt. on why to wear it.)

## 2023-12-21 DIAGNOSIS — I5033 Acute on chronic diastolic (congestive) heart failure: Secondary | ICD-10-CM | POA: Diagnosis not present

## 2023-12-21 DIAGNOSIS — N184 Chronic kidney disease, stage 4 (severe): Secondary | ICD-10-CM | POA: Diagnosis not present

## 2023-12-21 DIAGNOSIS — Z960 Presence of urogenital implants: Secondary | ICD-10-CM

## 2023-12-21 DIAGNOSIS — N1832 Chronic kidney disease, stage 3b: Secondary | ICD-10-CM | POA: Diagnosis not present

## 2023-12-21 LAB — CBC
HCT: 26.9 % — ABNORMAL LOW (ref 36.0–46.0)
Hemoglobin: 8.3 g/dL — ABNORMAL LOW (ref 12.0–15.0)
MCH: 29.3 pg (ref 26.0–34.0)
MCHC: 30.9 g/dL (ref 30.0–36.0)
MCV: 95.1 fL (ref 80.0–100.0)
Platelets: 188 K/uL (ref 150–400)
RBC: 2.83 MIL/uL — ABNORMAL LOW (ref 3.87–5.11)
RDW: 14.4 % (ref 11.5–15.5)
WBC: 9.7 K/uL (ref 4.0–10.5)
nRBC: 0 % (ref 0.0–0.2)

## 2023-12-21 LAB — BASIC METABOLIC PANEL WITH GFR
Anion gap: 7 (ref 5–15)
BUN: 34 mg/dL — ABNORMAL HIGH (ref 8–23)
CO2: 26 mmol/L (ref 22–32)
Calcium: 9.3 mg/dL (ref 8.9–10.3)
Chloride: 101 mmol/L (ref 98–111)
Creatinine, Ser: 3.34 mg/dL — ABNORMAL HIGH (ref 0.44–1.00)
GFR, Estimated: 13 mL/min — ABNORMAL LOW (ref >60)
Glucose, Bld: 140 mg/dL — ABNORMAL HIGH (ref 70–99)
Potassium: 3.9 mmol/L (ref 3.5–5.1)
Sodium: 134 mmol/L — ABNORMAL LOW (ref 135–145)

## 2023-12-21 LAB — MAGNESIUM: Magnesium: 2 mg/dL (ref 1.7–2.4)

## 2023-12-21 MED ORDER — ALBUTEROL SULFATE (2.5 MG/3ML) 0.083% IN NEBU
3.0000 mL | INHALATION_SOLUTION | Freq: Four times a day (QID) | RESPIRATORY_TRACT | Status: DC | PRN
  Administered 2023-12-21: 3 mL via RESPIRATORY_TRACT
  Filled 2023-12-21: qty 3

## 2023-12-21 MED ORDER — SODIUM CHLORIDE 0.9 % IV SOLN
2.0000 g | INTRAVENOUS | Status: DC
  Administered 2023-12-21 – 2023-12-23 (×3): 2 g via INTRAVENOUS
  Filled 2023-12-21 (×3): qty 20

## 2023-12-21 MED ORDER — FUROSEMIDE 10 MG/ML IJ SOLN
40.0000 mg | Freq: Three times a day (TID) | INTRAMUSCULAR | Status: AC
  Administered 2023-12-21 (×2): 40 mg via INTRAVENOUS
  Filled 2023-12-21 (×2): qty 4

## 2023-12-21 NOTE — Progress Notes (Signed)
 Physical Therapy Treatment Patient Details Name: Abigail Wallace MRN: [id number] DOB: Dec 23, 1945 Today's Date: 12/21/2023   History of Present Illness 78 yo female presenting 12/20/23 with AMS, dizziness, dysuria, N/V/D. Workup for UTI, CHF exacerbation. Head CT negative for acute abnormality; unchanged L frontal meningioma (8mm), brain atrophy. Of note, recent admission 11/24/23 for CHF. Other PMH includes COPD (2L O2 baseline), mild dementia, schizophrenia, urinary rentention with chronic foley, post polio syndrome, gout, CKD IV, T2DM, OSA, PPM.   PT Comments  Pt seen for additional session, requiring mod-maxA +1-2 for bed mobility, repositioning, self-care tasks; pt endorses fatigue and SOB. Son present and supportive; all education complete and questions answered; son declines need for HHPT services; they own necessary DME and have PCA services. Acute PT will sign off.      If plan is discharge home, recommend the following: A lot of help with walking and/or transfers;A lot of help with bathing/dressing/bathroom;Assistance with feeding;Direct supervision/assist for medications management;Direct supervision/assist for financial management;Assistance with cooking/housework;Assist for transportation;Help with stairs or ramp for entrance;Supervision due to cognitive status   Can travel by private vehicle      No  Equipment Recommendations  None recommended by PT    Recommendations for Other Services       Precautions / Restrictions Precautions Precautions: Fall;Other (comment) Recall of Precautions/Restrictions: Impaired Precaution/Restrictions Comments: watch SpO2 (recently wearing 2L O2 at home); chronic indwelling foley cath Restrictions Weight Bearing Restrictions Per Provider Order: No     Mobility  Bed Mobility Overal bed mobility: Needs Assistance Bed Mobility: Sit to Supine, Rolling Rolling: Max assist, +2 for safety/equipment, Used rails   Supine to sit: Mod assist,  +2 for safety/equipment, Used rails     General bed mobility comments: sit>supine with modA for BLE management; increased body habitus limiting space for bed mobility/rolling; rolling R/L with maxA for pelvic rotation and trunk repositioning; maxA+2 to scoot up in bed; pt assisting with repeated verbal cues, able to maintain grip on bedrail; decreased tolerance for laying flat    Transfers                   General transfer comment: deferred    Ambulation/Gait                   Stairs             Wheelchair Mobility     Tilt Bed    Modified Rankin (Stroke Patients Only)       Balance Overall balance assessment: Needs assistance Sitting-balance support: Single extremity supported, Feet unsupported Sitting balance-Leahy Scale: Poor Sitting balance - Comments: pt with preference for HOB elevated to allow her to lean on L side for sitting balance and energy conservation                                    Communication Communication Communication: Impaired Factors Affecting Communication: Reduced clarity of speech  Cognition Arousal: Alert Behavior During Therapy: WFL for tasks assessed/performed   PT - Cognitive impairments: History of cognitive impairments                       PT - Cognition Comments: h/o mild dementia, schizophrenia. pt following simple commands and answering some questions appropriately with increased time and cues. speech difficult to understand at times. clear memory and attention deficits Following commands: Impaired Following commands impaired: Follows  one step commands with increased time    Cueing Cueing Techniques: Verbal cues  Exercises      General Comments General comments (skin integrity, edema, etc.): pt received again with O2 [place] out of nose, pt reports unsure of how, educ pt and son on importance of leaving it in: SpO2 93% on 2L O2 [place]. pt left in partial sidelying on L-side, HOB elevated,  with pillows placed for pressure relief. discussed DME at home with pt's son including hospital bed with standing features, gel cushion overlay for pressure relief. all questions and concerns answered; discussed POC, acute PT signing off and use of maximove for OOB with nursing; son declines need for HHPT services      Pertinent Vitals/Pain Pain Assessment Pain Assessment: Faces Faces Pain Scale: No hurt Pain Location: generalized Pain Descriptors / Indicators: Discomfort Pain Intervention(s): Monitored during session    Home Living Family/patient expects to be discharged to:: Private residence Living Arrangements: Children Available Help at Discharge: Family;Available 24 hours/day;Personal care attendant Type of Home: Apartment Home Access: Level entry       Home Layout: One level Home Equipment: Shower seat;BSC/3in1;Tub bench;Rollator (4 wheels);Hospital bed;Wheelchair - Careers Adviser (comment) Additional Comments: hoyer lift, PCA 7a-6p    Prior Function            PT Goals (current goals can now be found in the care plan section) Acute Rehab PT Goals Patient Stated Goal: feel better PT Goal Formulation: With patient Time For Goal Achievement: 01/04/24 Potential to Achieve Goals: Fair Progress towards PT goals: Goals met/education completed, patient discharged from PT    Frequency    Min 2X/week      PT Plan      Co-evaluation              AM-PAC PT 6 Clicks Mobility   Outcome Measure  Help needed turning from your back to your side while in a flat bed without using bedrails?: A Lot Help needed moving from lying on your back to sitting on the side of a flat bed without using bedrails?: A Lot Help needed moving to and from a bed to a chair (including a wheelchair)?: Total Help needed standing up from a chair using your arms (e.g., wheelchair or bedside chair)?: Total Help needed to walk in hospital room?: Total Help needed climbing 3-5 steps with a  railing? : Total 6 Click Score: 8    End of Session   Activity Tolerance: Patient limited by fatigue Patient left: in bed;with call bell/phone within reach;with family/visitor present;with bed alarm set Nurse Communication: Mobility status;Need for lift equipment PT Visit Diagnosis: Other abnormalities of gait and mobility (R26.89);Muscle weakness (generalized) (M62.81)     Time: 9159-9143 PT Time Calculation (min) (ACUTE ONLY): 16 min  Charges:    $Therapeutic Activity: 8-22 mins PT General Charges $$ ACUTE PT VISIT: 1 Visit                     Darice Almas, PT, DPT Acute Rehabilitation Services  Personal: Secure Chat Rehab Office: [phone]  Darice LITTIE Almas 12/21/2023, 10:34 AM

## 2023-12-21 NOTE — Plan of Care (Signed)
 Md to bedside after RN notified that patient was bradycardic HR 30s-40s, and tele reading asystole. RN reported patient awake and asymptomatic. Telemetry review by MD revealed computer read asystole and intermittent bradycardia, but strip appearance with atrial paced regular rate and rhythm with occasional PVC. Patient sitting up in bed, talking with me. No SOB, CP, or other complaints. Heart RRR on exam. No interventions indicated at this time. Suspect telemetry not reading appropriately, possibly due to low voltage.   Elio Art, MD  Family Medicine PGY-1

## 2023-12-21 NOTE — Assessment & Plan Note (Addendum)
 Leukocytosis resolved. Creatinine elevated today, query due to diuresis vs bactrim . Blood culture grew gram positive cocci in chains, strep species, in both aerobic and anaerobic bottles. Bactrim  > augmentin  overnight for better coverage. However, urine culture now growing >100k gram negative rods. Patient remains afebrile.  - urine culture still in progress - PO augmentin  for UTI/bacteremia - AM CBC - PT/OT have yet to work with patient, await dispo recs

## 2023-12-21 NOTE — Assessment & Plan Note (Addendum)
 CKDIV - continue home sodium bicarb, avoid nephrotoxic agents.  pAF -  continue home eliquis , metoprolol , atrial paced on telemetry Schizophrenia - continue home olanzapine , depakote  Gout - continue home allopurinol  HTN - continue home amlodipine 

## 2023-12-21 NOTE — Assessment & Plan Note (Deleted)
 Patient with 2-3 weeks of dizziness. CT head without evidence of acute/subacute stroke. Patient with history of TD that explains her tremulous jaw and finger-to-nose testing. From chart review, appears her facial numbness is a chronic concern. Considered a brain MRI in addition to CT head, but her pacemaker in incompatible, and given subacute to chronic nature of complaints, would expect a stroke to appear on CT head. Will continue to monitor.

## 2023-12-21 NOTE — Discharge Instructions (Addendum)
 Thank you for letting us  care for you during your stay.  You were admitted to the [facility] Medicine Teaching Service.   You were treated for a UTI and infection in your blood during the admission with antibiotics. Based on the antibiotic sensitivities, you were discharged on _______. Please continue to take this for another ___ days.   Also, you were treated for a CHF exacerbation causing shortness of breath. You were given IV lasix  with improvement of your breathing and fluid status. We assess fluid status through different exam, vital, and lab values. On physical exam, we look at your daily weight, jugular venous distension, hepatojugular reflux, peripheral edema, abdominal edema to help determine your fluid status.   Because you also had wheezing and findings concerning for a COPD exacerbation, we started you on a 5 day course of steroids and scheduled albuterol  nebulizers in addition to your home COPD inhaler.   Please follow up with your primary care physician within 1 week of discharge.   If your symptoms worsen or return, please return to the hospital.  Please let us  know if you have questions about your stay at [facility].

## 2023-12-21 NOTE — Plan of Care (Signed)
 Called Dr. Fleeta Rothman, ID to discuss growth of Strep Mitis/Oralis in blood culture. Recommended 2D echo and TEE and starting ceftriaxone . Discussed patients listed allergy of keflex  with pharamcy, and it appears she has been treated with keflex  several times in the past. - augmentin  switched to ceftriaxone  - ordered TEE - ID to see in AM

## 2023-12-21 NOTE — Progress Notes (Signed)
 Daily Progress Note Intern Pager: [phone]  Patient name: Tima Curet [facility] Medical record number: [id number] Date of birth: December 19, 1945 Age: 78 y.o. Gender: female  Primary Care Provider: Delores Suzann HERO, MD Consultants: None Code Status: full  Pt Overview and Major Events to Date:  12/5 - admitted   Assessment and Plan:  This is an 78 year old female with PMH of CKD, pAfib, schizophrenia, HTN, OSA, COPD who is admitted with AMS, dysuria, SOB, n/v secondary to UTI and bacteremia and CHF and possible COPD exacerbation. Assessment & Plan Altered mental status UTI (urinary tract infection) CKD stage IIIb Indwelling catheter present on admission Leukocytosis resolved. Creatinine elevated today, query due to diuresis vs bactrim . Blood culture grew gram positive cocci in chains, strep species, in both aerobic and anaerobic bottles. Bactrim  > augmentin  overnight for better coverage. However, urine culture now growing >100k gram negative rods. Patient remains afebrile.  - urine culture still in progress - PO augmentin  for UTI/bacteremia - AM CBC - PT/OT have yet to work with patient, await dispo recs Acute on chronic diastolic heart failure (HCC) Chronic obstructive pulmonary disease (HCC) Obstructive sleep apnea In 2024, her dry weight was documented as 125 kg and in May 2025, around 135 kg. Her weights earlier this year have ranged around 320 lbs/145 kg. Net I/O -400 ml, but with weight gain of 4.7 kg since yesterday per chart review. These do not correlate; however, she has increased volume on exam today. Echo with EF 50-55%, unfortunately unable to visualize IVC. Her creatinine did bump, but this could be due to bactrim  as opposed to diuresis.  Patient did not receive her breztri  inhaler at all yesterday as medication was not available.  Did receive albuterol  nebs. Still having SOB.  - Give lasix  40 mg IV twice today - AM BMP, Mg - continue scheduled albuterol  nebs q4h -  first dose of prednisone  40 mg scheduled for today for 5 days (12/6-12/10) - continue scheduled breztri  inhaler - strict I/Os - daily weights - 2L [place] (home) for O2 sats 88-92% - CPAP at night, she refused last night - offered palliative care consult, which was declined Chronic health problem CKDIV - continue home sodium bicarb, avoid nephrotoxic agents.  pAF -  continue home eliquis , metoprolol , atrial paced on telemetry Schizophrenia - continue home olanzapine , depakote  Gout - continue home allopurinol  HTN - continue home amlodipine   FEN/GI: renal/carb modified PPx: eliquis  Dispo:Pending PT recommendations  pending clinical improvement .   Subjective:  Son at bedside and asks appropriate questions. Patient reports she is SOB and dizzy. Has knee pain but no other complaints. Did not use CPAP overnight. Not wearing nasal cannula this AM. Her son reports fluid intake/diet is a big challenge for her at home.   Objective: Temp:  [98.1 F (36.7 C)-98.8 F (37.1 C)] 98.1 F (36.7 C) (12/06 0330) Pulse Rate:  [59-74] 59 (12/06 0330) Resp:  [15-24] 20 (12/06 0330) BP: (118-169)/(50-102) 139/63 (12/06 0330) SpO2:  [92 %-99 %] 95 % (12/06 0330) FiO2 (%):  [32 %] 32 % (12/05 2300) Weight:  [152.3 kg] 152.3 kg (12/06 0300) Physical Exam: General: obese but frail appearing, sitting on edge of bed Cardiovascular: RRR, faint JVD, no HJR, 2+ pitting edema midshin, 1+ pitting edema at the knee, no abdominal ascites but large panus Respiratory: faint breath sounds, no wheezes, questionable inspiratory crackles Abdomen: soft  Laboratory: Most recent CBC Lab Results  Component Value Date   WBC 9.7 12/21/2023  HGB 8.3 (L) 12/21/2023   HCT 26.9 (L) 12/21/2023   MCV 95.1 12/21/2023   PLT 188 12/21/2023   Most recent BMP    Latest Ref Rng & Units 12/21/2023    2:14 AM  BMP  Glucose 70 - 99 mg/dL 859   BUN 8 - 23 mg/dL 34   Creatinine 9.55 - 1.00 mg/dL 6.65   Sodium 864 - 854 mmol/L  134   Potassium 3.5 - 5.1 mmol/L 3.9   Chloride 98 - 111 mmol/L 101   CO2 22 - 32 mmol/L 26   Calcium  8.9 - 10.3 mg/dL 9.3    Imaging/Diagnostic Tests: Echo EF 50-55%. Unableto visualize IVC. detailed report in chart.   Alena Morrison, [place], MD 12/21/2023, 7:09 AM  PGY-1, [facility] FPTS Intern pager: [phone], text pages welcome Secure chat group [facility] Teaching Service

## 2023-12-21 NOTE — Assessment & Plan Note (Addendum)
 In 2024, her dry weight was documented as 125 kg and in May 2025, around 135 kg. Her weights earlier this year have ranged around 320 lbs/145 kg. Net I/O -400 ml, but with weight gain of 4.7 kg since yesterday per chart review. These do not correlate; however, she has increased volume on exam today. Echo with EF 50-55%, unfortunately unable to visualize IVC. Her creatinine did bump, but this could be due to bactrim  as opposed to diuresis.  Patient did not receive her breztri  inhaler at all yesterday as medication was not available.  Did receive albuterol  nebs. Still having SOB.  - Give lasix  40 mg IV twice today - AM BMP, Mg - continue scheduled albuterol  nebs q4h - first dose of prednisone  40 mg scheduled for today for 5 days (12/6-12/10) - continue scheduled breztri  inhaler - strict I/Os - daily weights - 2L  (home) for O2 sats 88-92% - CPAP at night, she refused last night - offered palliative care consult, which was declined

## 2023-12-21 NOTE — Evaluation (Signed)
 Physical Therapy Evaluation Patient Details Name: Abigail Wallace MRN: [id number] DOB: 1945/02/03 Today's Date: 12/21/2023  History of Present Illness  78 yo female presenting 12/20/23 with AMS, dizziness, dysuria, N/V/D. Workup for UTI, CHF exacerbation. Head CT negative for acute abnormality; unchanged L frontal meningioma (8mm), brain atrophy. Of note, recent admission 11/24/23 for CHF. Other PMH includes COPD (2L O2 baseline), mild dementia, schizophrenia, urinary rentention with chronic foley, post polio syndrome, gout, CKD IV, T2DM, OSA, PPM.   Clinical Impression  Pt presents with an overall decrease in functional mobility secondary to above. PTA, pt lives with son and PCA assist, requires assist for all mobility/ADLs/iADLs, including hoyer lift transfer OOB. Today, pt requiring mod-maxA for bed mobility, tolerating prolonged sitting EOB activity; endorses DOE and fatigue. Pt would benefit from continued acute PT services to maximize functional mobility and independence prior to d/c home.     Pt received having removed her O2 [place] with SpO2 84% on RA Replaced 2L O2  with SpO2 >/93%     If plan is discharge home, recommend the following: A lot of help with walking and/or transfers;A lot of help with bathing/dressing/bathroom;Assistance with feeding;Direct supervision/assist for medications management;Direct supervision/assist for financial management;Assistance with cooking/housework;Assist for transportation;Help with stairs or ramp for entrance;Supervision due to cognitive status   Can travel by private vehicle        Equipment Recommendations None recommended by PT  Recommendations for Other Services       Functional Status Assessment Patient has had a recent decline in their functional status and demonstrates the ability to make significant improvements in function in a reasonable and predictable amount of time.     Precautions / Restrictions Precautions Precautions:  Fall;Other (comment) Recall of Precautions/Restrictions: Impaired Precaution/Restrictions Comments: watch SpO2 (recently wearing 2L O2 at home); chronic indwelling foley cath Restrictions Weight Bearing Restrictions Per Provider Order: No      Mobility  Bed Mobility Overal bed mobility: Needs Assistance Bed Mobility: Supine to Sit     Supine to sit: Mod assist, Max assist, HOB elevated     General bed mobility comments: son demonstrating how he assists supine>sit at home, feedback provided on body mechanics and technique (he uses transfer sheet to assist at home); pt requiring mod-maxA for trunk elevation, BLE management and scooting hips to EOB. pt requesting rest break secondary to fatigue and SOB    Transfers                   General transfer comment: deferred    Ambulation/Gait                  Stairs            Wheelchair Mobility     Tilt Bed    Modified Rankin (Stroke Patients Only)       Balance Overall balance assessment: Needs assistance Sitting-balance support: Single extremity supported, Feet unsupported Sitting balance-Leahy Scale: Poor Sitting balance - Comments: pt with preference for HOB elevated to allow her to lean on L side for sitting balance and energy conservation                                     Pertinent Vitals/Pain Pain Assessment Pain Assessment: Faces Faces Pain Scale: Hurts a little bit Pain Location: generalized Pain Descriptors / Indicators: Discomfort Pain Intervention(s): Monitored during session, Limited activity within patient's tolerance, Repositioned  Home Living Family/patient expects to be discharged to:: Private residence Living Arrangements: Children Available Help at Discharge: Family;Available 24 hours/day;Personal care attendant Type of Home: Apartment Home Access: Level entry       Home Layout: One level Home Equipment: Shower seat;BSC/3in1;Tub bench;Rollator (4  wheels);Hospital bed;Wheelchair - Careers Adviser (comment) Additional Comments: hoyer lift, PCA 7a-6p    Prior Function Prior Level of Function : Needs assist             Mobility Comments: modA to sit EOB; hoyer lift for all OOB, including to wheelchair and bathroom ADLs Comments: son/PCA assist with all aspects of AD's, son gets her in W/C via hoyer to wash her hair; pt able to feed self     Extremity/Trunk Assessment   Upper Extremity Assessment Upper Extremity Assessment: Generalized weakness    Lower Extremity Assessment Lower Extremity Assessment: RLE deficits/detail;LLE deficits/detail RLE Deficits / Details: functional observed strength </ 3/5 LLE Deficits / Details: functional observed strength </ 3/5       Communication   Communication Communication: Impaired Factors Affecting Communication: Reduced clarity of speech    Cognition Arousal: Alert Behavior During Therapy: WFL for tasks assessed/performed   PT - Cognitive impairments: History of cognitive impairments                       PT - Cognition Comments: h/o mild dementia, schizophrenia. pt following simple commands and answering some questions appropriately with increased time and cues. speech difficult to understand at times Following commands: Impaired Following commands impaired: Follows one step commands with increased time     Cueing Cueing Techniques: Verbal cues     General Comments General comments (skin integrity, edema, etc.): pt requiring maxA for gown change while sitting EOB, son present to assist. educ pt and son re: role of acute PT, POC, activity recommendations, lift equipment available while admitted, potential d/c needs including DME. session limited by arrival of MD and son with many questions for them    Exercises     Assessment/Plan    PT Assessment Patient needs continued PT services  PT Problem List Decreased strength;Decreased activity tolerance;Decreased  mobility       PT Treatment Interventions DME instruction;Functional mobility training;Balance training;Therapeutic activities;Therapeutic exercise;Patient/family education    PT Goals (Current goals can be found in the Care Plan section)  Acute Rehab PT Goals Patient Stated Goal: feel better PT Goal Formulation: With patient Time For Goal Achievement: 01/04/24 Potential to Achieve Goals: Fair    Frequency Min 2X/week     Co-evaluation               AM-PAC PT 6 Clicks Mobility  Outcome Measure Help needed turning from your back to your side while in a flat bed without using bedrails?: A Lot Help needed moving from lying on your back to sitting on the side of a flat bed without using bedrails?: A Lot Help needed moving to and from a bed to a chair (including a wheelchair)?: Total Help needed standing up from a chair using your arms (e.g., wheelchair or bedside chair)?: Total Help needed to walk in hospital room?: Total Help needed climbing 3-5 steps with a railing? : Total 6 Click Score: 8    End of Session   Activity Tolerance: Patient limited by fatigue Patient left: in bed;with call bell/phone within reach;with family/visitor present;with bed alarm set Nurse Communication: Mobility status;Need for lift equipment PT Visit Diagnosis: Other abnormalities of gait and mobility (R26.89);Muscle  weakness (generalized) (M62.81)    Time: 9193-9178 PT Time Calculation (min) (ACUTE ONLY): 15 min   Charges:   PT Evaluation $PT Eval Moderate Complexity: 1 Mod   PT General Charges $$ ACUTE PT VISIT: 1 Visit        Darice Almas, PT, DPT Acute Rehabilitation Services  Personal: Secure Chat Rehab Office: [phone]  Darice LITTIE Almas 12/21/2023, 8:36 AM

## 2023-12-21 NOTE — Progress Notes (Signed)
 OT Cancellation Note  Patient Details Name: Abigail Wallace MRN: [id number] DOB: 10/02/1945   Cancelled Treatment:    Reason Eval/Treat Not Completed: OT screened, no needs identified, will sign off (Son provides 24/7 assist adn hoyer for mobility. No acute OT needs)  [name],HILLARY 12/21/2023, 8:27 AM Kreg Sink, OT/L   Acute OT Clinical Specialist Acute Rehabilitation Services Pager [phone] Office [phone]

## 2023-12-22 ENCOUNTER — Inpatient Hospital Stay (HOSPITAL_COMMUNITY)

## 2023-12-22 DIAGNOSIS — R110 Nausea: Secondary | ICD-10-CM

## 2023-12-22 DIAGNOSIS — N1832 Chronic kidney disease, stage 3b: Secondary | ICD-10-CM | POA: Diagnosis not present

## 2023-12-22 DIAGNOSIS — I5033 Acute on chronic diastolic (congestive) heart failure: Secondary | ICD-10-CM | POA: Diagnosis not present

## 2023-12-22 DIAGNOSIS — T827XXA Infection and inflammatory reaction due to other cardiac and vascular devices, implants and grafts, initial encounter: Secondary | ICD-10-CM

## 2023-12-22 DIAGNOSIS — N189 Chronic kidney disease, unspecified: Secondary | ICD-10-CM

## 2023-12-22 DIAGNOSIS — I442 Atrioventricular block, complete: Secondary | ICD-10-CM

## 2023-12-22 DIAGNOSIS — R443 Hallucinations, unspecified: Principal | ICD-10-CM

## 2023-12-22 DIAGNOSIS — K0820 Unspecified atrophy of edentulous alveolar ridge: Secondary | ICD-10-CM

## 2023-12-22 DIAGNOSIS — E669 Obesity, unspecified: Secondary | ICD-10-CM

## 2023-12-22 DIAGNOSIS — B955 Unspecified streptococcus as the cause of diseases classified elsewhere: Secondary | ICD-10-CM | POA: Diagnosis present

## 2023-12-22 DIAGNOSIS — Z950 Presence of cardiac pacemaker: Secondary | ICD-10-CM

## 2023-12-22 DIAGNOSIS — R7881 Bacteremia: Secondary | ICD-10-CM | POA: Diagnosis present

## 2023-12-22 DIAGNOSIS — I330 Acute and subacute infective endocarditis: Secondary | ICD-10-CM

## 2023-12-22 DIAGNOSIS — A408 Other streptococcal sepsis: Secondary | ICD-10-CM

## 2023-12-22 DIAGNOSIS — N184 Chronic kidney disease, stage 4 (severe): Secondary | ICD-10-CM | POA: Diagnosis not present

## 2023-12-22 DIAGNOSIS — N3000 Acute cystitis without hematuria: Secondary | ICD-10-CM

## 2023-12-22 LAB — CBC WITH DIFFERENTIAL/PLATELET
Abs Immature Granulocytes: 0.49 K/uL — ABNORMAL HIGH (ref 0.00–0.07)
Basophils Absolute: 0 K/uL (ref 0.0–0.1)
Basophils Relative: 0 %
Eosinophils Absolute: 0 K/uL (ref 0.0–0.5)
Eosinophils Relative: 0 %
HCT: 26.6 % — ABNORMAL LOW (ref 36.0–46.0)
Hemoglobin: 8.4 g/dL — ABNORMAL LOW (ref 12.0–15.0)
Immature Granulocytes: 5 %
Lymphocytes Relative: 17 %
Lymphs Abs: 1.5 K/uL (ref 0.7–4.0)
MCH: 29.4 pg (ref 26.0–34.0)
MCHC: 31.6 g/dL (ref 30.0–36.0)
MCV: 93 fL (ref 80.0–100.0)
Monocytes Absolute: 0.6 K/uL (ref 0.1–1.0)
Monocytes Relative: 6 %
Neutro Abs: 6.5 K/uL (ref 1.7–7.7)
Neutrophils Relative %: 72 %
Platelets: 208 K/uL (ref 150–400)
RBC: 2.86 MIL/uL — ABNORMAL LOW (ref 3.87–5.11)
RDW: 14.1 % (ref 11.5–15.5)
WBC: 9 K/uL (ref 4.0–10.5)
nRBC: 0 % (ref 0.0–0.2)

## 2023-12-22 LAB — BASIC METABOLIC PANEL WITH GFR
Anion gap: 7 (ref 5–15)
BUN: 38 mg/dL — ABNORMAL HIGH (ref 8–23)
CO2: 27 mmol/L (ref 22–32)
Calcium: 9 mg/dL (ref 8.9–10.3)
Chloride: 100 mmol/L (ref 98–111)
Creatinine, Ser: 3.42 mg/dL — ABNORMAL HIGH (ref 0.44–1.00)
GFR, Estimated: 13 mL/min — ABNORMAL LOW (ref >60)
Glucose, Bld: 214 mg/dL — ABNORMAL HIGH (ref 70–99)
Potassium: 4 mmol/L (ref 3.5–5.1)
Sodium: 134 mmol/L — ABNORMAL LOW (ref 135–145)

## 2023-12-22 LAB — URINE CULTURE: Culture: >=100000 — AB

## 2023-12-22 LAB — MAGNESIUM: Magnesium: 2.1 mg/dL (ref 1.7–2.4)

## 2023-12-22 LAB — CULTURE, BLOOD (ROUTINE X 2): Special Requests: ADEQUATE

## 2023-12-22 MED ORDER — CHLORHEXIDINE GLUCONATE CLOTH 2 % EX PADS
6.0000 | MEDICATED_PAD | Freq: Every day | CUTANEOUS | Status: DC
  Administered 2023-12-23 – 2023-12-27 (×5): 6 via TOPICAL

## 2023-12-22 MED ORDER — IOHEXOL 9 MG/ML PO SOLN
ORAL | Status: AC
  Filled 2023-12-22: qty 1000

## 2023-12-22 MED ORDER — ONDANSETRON HCL 4 MG/2ML IJ SOLN: 4.0000 mg | Freq: Four times a day (QID) | INTRAMUSCULAR | Status: DC | PRN

## 2023-12-22 MED ORDER — NYSTATIN 100000 UNIT/GM EX POWD
Freq: Two times a day (BID) | CUTANEOUS | Status: DC
  Filled 2023-12-22: qty 15

## 2023-12-22 MED ORDER — IOHEXOL 9 MG/ML PO SOLN
500.0000 mL | ORAL | Status: AC
  Administered 2023-12-22 (×2): 500 mL via ORAL

## 2023-12-22 MED ORDER — FUROSEMIDE 10 MG/ML IJ SOLN
40.0000 mg | Freq: Three times a day (TID) | INTRAMUSCULAR | Status: AC
  Administered 2023-12-22 (×2): 40 mg via INTRAVENOUS
  Filled 2023-12-22 (×2): qty 4

## 2023-12-22 NOTE — Anesthesia Preprocedure Evaluation (Signed)
 Anesthesia Evaluation  Patient identified by MRN, date of birth, ID band Patient awake    Reviewed: Allergy & Precautions, NPO status , Patient's Chart, lab work & pertinent test results  History of Anesthesia Complications Negative for: history of anesthetic complications  Airway Mallampati: II  TM Distance: >3 FB Neck ROM: Full    Dental no notable dental hx. (+) Edentulous Upper, Edentulous Lower   Pulmonary sleep apnea , COPD, former smoker   Pulmonary exam normal breath sounds clear to auscultation       Cardiovascular hypertension, (-) angina +CHF  (-) Past MI Normal cardiovascular exam+ pacemaker  Rhythm:Regular Rate:Normal     Neuro/Psych  PSYCHIATRIC DISORDERS     Dementia    GI/Hepatic ,GERD  ,,  Endo/Other  diabetes  Class 4 obesity  Renal/GU ARFRenal diseaseLab Results      Component                Value               Date                           K                        4.0                 12/22/2023                CO2                      27                  12/22/2023                BUN                      38 (H)              12/22/2023                CREATININE               3.42 (H)            12/22/2023                              Musculoskeletal   Abdominal   Peds  Hematology   Anesthesia Other Findings TEE for Endocarditis  All: see list  Reproductive/Obstetrics                              Anesthesia Physical Anesthesia Plan  ASA: 4  Anesthesia Plan: MAC   Post-op Pain Management: Minimal or no pain anticipated   Induction:   PONV Risk Score and Plan: Treatment may vary due to age or medical condition and Propofol  infusion  Airway Management Planned: Natural Airway and Nasal Cannula  Additional Equipment: None  Intra-op Plan:   Post-operative Plan:   Informed Consent: I have reviewed the patients History and Physical, chart, labs and discussed  the procedure including the risks, benefits and alternatives for the proposed anesthesia with the patient or authorized representative who has indicated his/her understanding and acceptance.       Plan Discussed with: CRNA  Anesthesia Plan Comments:  Anesthesia Quick Evaluation

## 2023-12-22 NOTE — Assessment & Plan Note (Addendum)
 VSS, intermittent diastolic hypotension. Stable leukocytosis. Blood culture grew strep species, in both aerobic and anaerobic bottles. Bactrim  > augmentin  for better coverage and switch to ceftriaxone . Patient remains afebrile.  Concern for possible endocarditis given bacteremia with pacemaker placed for sinus bradycardia. - urine culture growing Klebsiella pneumonia, pending sensitivities - Continue ceftriaxone  UTI/bacteremia strep mitis/oralis - ID consulted, appreciate all their recs - CTAP to evaluate for other possible sites of infection - Consult cardiology for TEE - AM CBC, BMP, mag - PT/OT, refused home health PT

## 2023-12-22 NOTE — Assessment & Plan Note (Addendum)
 CKDIV - continue home sodium bicarb, avoid nephrotoxic agents.  pAF -  continue home eliquis , metoprolol , atrial paced on telemetry Schizophrenia - continue home olanzapine , depakote  Urinary retention - secondary to polio, chronic Foley Gout - continue home allopurinol  HTN - continue home amlodipine 

## 2023-12-22 NOTE — Assessment & Plan Note (Addendum)
 Possible dry weight 135 to 145 kg.  EF 50 to 55% this hospitalization, historically difficult to evaluate volume status due to obesity.  Some crackles and pitting edema today on exam, respiratory status unchanged from yesterday. Creatinine trending up today 3.42 from 3.34, baseline appears to be 2.8.  30% increase of baseline creatinine 2.8 would be 3.6 for AKI, suspect likely 2/2 diuresis VS decreased prerenal supply from CHF exacerbation given patient's increase in weight.  Will continue to diurese until AKI criteria.  Did get her Breztri  over the last 24 hours, scheduled albuterol  switch to as needed by RT yesterday.  Questionably baseline oxygen  of 2 L, son states she uses 2 L as needed at home and often does not need it, but was using it more over the last 2 weeks. - Continue lasix  40 mg IV twice today - Home diuresis Lasix  80 mg every other day, consider changing to 40 mg daily - albuterol  nebs every 6 hours as needed - Continue prednisone  40 mg for 5 days (12/6-12/10) - continue scheduled breztri  inhaler - strict I/Os, -510 cc last 24 hours with 2 L urine output - daily weights, 153.4 kg today - Saturation goal 88-92% with COPD - 2L [place] O2 sats >90% overnight - CPAP at night, she refused last night

## 2023-12-22 NOTE — Progress Notes (Signed)
 Daily Progress Note Intern Pager: [phone]  Patient name: Abigail Wallace [facility] Medical record number: [id number] Date of birth: [birth year] Age: 78 y.o. Gender: female  Primary Care Provider: Delores Suzann HERO, MD Consultants: None Code Status: full   Pt Overview and Major Events to Date:  12/5 - admitted    Assessment and Plan:   This is an 78 year old female with PMH of CKD, pAfib, schizophrenia, HTN, OSA, COPD who is admitted for AMS, UTI bacteremia, exacerbation CHF exacerbation and acute COPD exacerbation.  Doing better this morning, still some signs of volume overload and weight still up, continue to diurese. Assessment & Plan Altered mental status UTI (urinary tract infection) CKD stage IIIb Indwelling catheter present on admission VSS, intermittent diastolic hypotension. Stable leukocytosis. Blood culture grew strep species, in both aerobic and anaerobic bottles. Bactrim  > augmentin  for better coverage and switch to ceftriaxone . Patient remains afebrile.  Concern for possible endocarditis given bacteremia with pacemaker placed for sinus bradycardia. - urine culture growing Klebsiella pneumonia, pending sensitivities - Continue ceftriaxone  UTI/bacteremia strep mitis/oralis - ID consulted, appreciate all their recs - CTAP to evaluate for other possible sites of infection - Consult cardiology for TEE - AM CBC, BMP, mag - PT/OT, refused home health PT Acute on chronic diastolic heart failure (HCC) Chronic obstructive pulmonary disease (HCC) Obstructive sleep apnea Possible dry weight 135 to 145 kg.  EF 50 to 55% this hospitalization, historically difficult to evaluate volume status due to obesity.  Some crackles and pitting edema today on exam, respiratory status unchanged from yesterday. Creatinine trending up today 3.42 from 3.34, baseline appears to be 2.8.  30% increase of baseline creatinine 2.8 would be 3.6 for AKI, suspect likely 2/2 diuresis VS decreased prerenal  supply from CHF exacerbation given patient's increase in weight.  Will continue to diurese until AKI criteria.  Did get her Breztri  over the last 24 hours, scheduled albuterol  switch to as needed by RT yesterday.  Questionably baseline oxygen  of 2 L, son states she uses 2 L as needed at home and often does not need it, but was using it more over the last 2 weeks. - Continue lasix  40 mg IV twice today - Home diuresis Lasix  80 mg every other day, consider changing to 40 mg daily - albuterol  nebs every 6 hours as needed - Continue prednisone  40 mg for 5 days (12/6-12/10) - continue scheduled breztri  inhaler - strict I/Os, -510 cc last 24 hours with 2 L urine output - daily weights, 153.4 kg today - Saturation goal 88-92% with COPD - 2L [place] O2 sats >90% overnight - CPAP at night, she refused last night Chronic health problem CKDIV - continue home sodium bicarb, avoid nephrotoxic agents.  pAF -  continue home eliquis , metoprolol , atrial paced on telemetry Schizophrenia - continue home olanzapine , depakote  Urinary retention - secondary to polio, chronic Foley Gout - continue home allopurinol  HTN - continue home amlodipine   FEN/GI: Renal/carb modified PPx: Eliquis  Dispo:Home declined Home with home health PT services. Barriers include bacteremia evaluation.   Subjective:  Saw patient this morning at bedside with son [name].  He had many questions regarding the endocarditis and management of patient's conditions.  States that though she has needed the 2 L more frequently, her usual baseline is as needed 2 L oxygen  not daily continuous.  Patient states that her abdominal pain and headache are largely unchanged.  Denies feeling lightheaded or dizzy, though not ambulating much.  Denied chest pain or  shortness of breath  Objective: Temp:  [97 F (36.1 C)-98.9 F (37.2 C)] 98.1 F (36.7 C) (12/07 0722) Pulse Rate:  [62-70] 67 (12/07 0722) Resp:  [16-21] 16 (12/07 0722) BP: (127-159)/(48-70)  129/61 (12/07 0722) SpO2:  [90 %-99 %] 91 % (12/07 0722) FiO2 (%):  [28 %] 28 % (12/06 1936) Weight:  [153.4 kg] 153.4 kg (12/07 0432) Physical Exam: General: Obese and frail, lying in left lateral decubitus Cardiovascular: Regular rate rhythm, no murmurs rubs or gallops, JVD difficult to assess given patient's large neck circumference and not wanting to lie on back, bilateral pulses 2+, capillary refill less than 2 seconds Respiratory: Faint inspiratory crackles left lower lobe, no wheezes or rhonchi Abdomen: Large obese pannus with herniated bowel, active bowel sounds, nontender Extremities: 3+ pitting edema 1/2 tibia left leg, right leg 2+ pitting edema to ankle  Laboratory: Most recent CBC Lab Results  Component Value Date   WBC 9.0 12/22/2023   HGB 8.4 (L) 12/22/2023   HCT 26.6 (L) 12/22/2023   MCV 93.0 12/22/2023   PLT 208 12/22/2023   Most recent BMP    Latest Ref Rng & Units 12/22/2023    3:00 AM  BMP  Glucose 70 - 99 mg/dL 785   BUN 8 - 23 mg/dL 38   Creatinine 9.55 - 1.00 mg/dL 6.57   Sodium 864 - 854 mmol/L 134   Potassium 3.5 - 5.1 mmol/L 4.0   Chloride 98 - 111 mmol/L 100   CO2 22 - 32 mmol/L 27   Calcium  8.9 - 10.3 mg/dL 9.0    Magnesium  2.1  Imaging/Diagnostic Tests: No new imaging  Lorrane Pac, MD 12/22/2023, 7:55 AM  PGY-1, [facility] FPTS Intern pager: [phone], text pages welcome Secure chat group [facility] Teaching Service

## 2023-12-22 NOTE — Progress Notes (Signed)
   [facility] has been requested to perform a transesophageal echocardiogram on Abigail Wallace for bacteremia with PPM in place.   The patient does NOT have any absolute or relative contraindications to a Transesophageal Echocardiogram (TEE).  The patient has: Current Oxygen  Requirement and History of Obstructive Sleep Apnea  After careful review of history and examination, the risks and benefits of transesophageal echocardiogram have been explained including risks of esophageal damage, perforation (1:10,000 risk), bleeding, pharyngeal hematoma as well as other potential complications associated with conscious sedation including aspiration, arrhythmia, respiratory failure and death. Alternatives to treatment were discussed, questions were answered. Patient is willing to proceed.   Labs today show Hgb at 8.4 and platelets 208 K. BP has been stable with no requirement for pressors. A message has been sent to see if this can be added for tomorrow. Dr. Nishan previously added orders and an EP consult is pending for tomorrow.   Signed, Laymon CHRISTELLA Qua, PA-C  12/22/2023 1:23 PM

## 2023-12-22 NOTE — Consult Note (Signed)
 Date of Admission:  12/20/2023          Reason for Consult: Streptococcus mitis oralis bacteremia Referring Provider: Milda Deed, MD   Assessment:  Streptococcus mitis oralis bacteremia with sepsis in patient with pacemaker who is pacer dependent due to history of complete heart block Rule out endocarditis CKD Edentulous  ? Dementia Obesity Chronic nausea  Plan:  Continue ceftriaxone  for now Follow-up repeat blood cultures Cardiology are planning on TEE Electrophysiology with Cathlyn Birmingham will see the patient tomorrow device extraction is not trivial given that she is pacer dependent due to her complete heart block If we are convinced that she has a cardiac device infection but it is not safe to remove her pacemaker and place a temporary pacemaker or leadless pacemaker would treat her with 4-6 weeks of IV antibiotics followed by chronic suppressive antibiotics what is the duration of IV antibiotics followed by chronic oral antibiotics Will get a CT of the abdomen pelvis to look for an intra-abdominal source of infection she will need some antiemetics due to her chronic nausea  Principal Problem:   Streptococcal bacteremia Active Problems:   Chronic obstructive pulmonary disease (HCC)   Obstructive sleep apnea   Artificial cardiac pacemaker   CKD stage IIIb   Chronic health problem   Urinary tract infection associated with indwelling urethral catheter   Indwelling catheter present on admission   Altered mental status   UTI (urinary tract infection)   Acute on chronic diastolic heart failure (HCC)   Chronic kidney disease (CKD), stage 4 (HCC)   Dizziness   Scheduled Meds:  allopurinol   50 mg Oral q AM   amLODipine   10 mg Oral Daily   apixaban   2.5 mg Oral BID   budesonide -glycopyrrolate -formoterol   2 puff Inhalation BID   divalproex   250 mg Oral Q12H   furosemide   40 mg Intravenous Q8H   metoprolol  tartrate  100 mg Oral TID   nystatin    Topical BID    OLANZapine   15 mg Oral QHS   pravastatin   40 mg Oral QHS   predniSONE   40 mg Oral Q breakfast   sodium bicarbonate   650 mg Oral TID   Continuous Infusions:  cefTRIAXone  (ROCEPHIN )  IV 2 g (12/21/23 1702)   PRN Meds:.acetaminophen , albuterol , diclofenac  Sodium, nystatin  cream, oxyCODONE   HPI: Abigail Wallace is a 78 y.o. female who a pacemaker for [employer] who suffers from some underlying cognitive deficits of understood what her son was telling me who had been admitted in November for what was thought to be heart failure exacerbation at that time who presents now with fevers chills rigors malaise weakness and worsening confusion as well as nausea vomiting and diarrhea she allegedly also had dysuria as well.  UA was analyzed which showed some leukocytes and nitrates and bacteria.  She had brain imaging as well.  Blood cultures were taken and these have yielded Streptococcus mitis oralis which is certainly not coming from her urine.  She is a dentulous so I would doubt an oropharyngeal source and would think potentially more a GI source of this bacteremia.  The bigger concern though is that this bacteria has a predilection for heart valves and she might have native valve endocarditis.  In addition to that of course the even bigger concern is if she might have infection of her cardiac device.  Her 2D echocardiogram fails to show evidence of vegetations on the valves or her device but to definitively  look for vegetation 1 would need a TEE and even without vegetations patients can certainly still have an infected cardiac devices.  Fortunately this is not a Staphylococcus aureus but I do have a high degree of anxiety that her device could be infected she is going to be seen by cardiology and electrophysiology.  I think she will need a TEE as long it is safe to do.  I will help with the decision making and then the key question will be if  we are convinced of infection she can have her pacemaker  replaced and have a temporary pacemaker or leadless pacemaker placed or whether the device will just remain in place and we would give her parenteral antibiotics potentially followed by lifelong suppressive antibiotics.  At present we will use ceftriaxone  prior to getting sensitivity data data back but hopefully we can use penicillin  to be giving her more narrow and bacteriocidal option.  I would like to scan her abdomen with a CT with oral contrast though she is suffering from nausea and vomiting and will need to make sure that she can actually have that done today or tomorrow.  I will put orders in and we can see if she can do this with anti-emetics  Repeat blood cultures have been taken    I personally spent a total of 83 minutes in the care of the patient today including preparing to see the patient, getting/reviewing separately obtained history, performing a medically appropriate exam/evaluation, counseling and educating, placing orders, referring and communicating with other health care professionals, documenting clinical information in the EHR, independently interpreting results, communicating results, and coordinating care.   Evaluation of the patient requires complex antimicrobial therapy evaluation, counseling , isolation needs to reduce disease transmission and risk assessment and mitigation.     Review of Systems: Review of Systems  Unable to perform ROS: Mental acuity    Past Medical History:  Diagnosis Date   Abdominal wall hernia 01/2017   Advanced care planning/counseling discussion 09/29/2021   Atrial fibrillation (HCC)    CHF (congestive heart failure) (HCC)    CKD (chronic kidney disease)    COPD (chronic obstructive pulmonary disease) (HCC)    Coronary artery disease    NON-CRITICAL   Ectasis aorta    GERD (gastroesophageal reflux disease)    Gout    History of Sundowning 06/15/2023   Hypertension    Memory difficulties 12/02/2013   Pacemaker    Paranoid  schizophrenia (HCC)    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         Tardive dyskinesia 04/26/2011   Type 2 diabetes mellitus (HCC)     Social History   Tobacco Use   Smoking status: Former    Current packs/day: 0.00    Types: Cigarettes    Quit date: 02/2022    Years since quitting: 1.8   Smokeless tobacco: Never   Tobacco comments:    1cig a day    07/05/2022 patient quit when going to the hospital 02/2022  Vaping Use   Vaping status: Former  Substance Use Topics   Alcohol  use: No    Alcohol /week: 0.0 standard drinks of alcohol    Drug use: No    Family History  Problem Relation Age of Onset   Heart disease Mother    Heart disease Father    Stroke Sister    Diabetes type II Son    Allergies  Allergen Reactions   Abilify [Aripiprazole] Anaphylaxis, Shortness Of Breath and Other (See Comments)    Tremors, also   Keflex  [Cephalexin ] Swelling and Other (See Comments)    Tongue swelling   Versacloz  [Clozapine ] Anaphylaxis   Ativan  [Lorazepam ] Other (See Comments)    Unknown reaction   Benadryl [Diphenhydramine Hcl] Other (See Comments)    Vision changes   Benztropine  Other (See Comments)    Mobility issues   Latuda [Lurasidone Hcl] Other (See Comments)    Tremors   Remeron [Mirtazapine] Other (See Comments)    Tremors   Haldol  [Haloperidol  Lactate] Other (See Comments)    Tardive dyskinesias  Tremors Somnolence Speech changes Sialorrhea    Codeine Other (See Comments)    Unknown reaction   Latex Rash    OBJECTIVE: Blood pressure (!) 143/48, pulse 67, temperature 98 F (36.7 C), temperature source Oral, resp. rate 16, weight (!) 153.4 kg, SpO2 91%.  Physical Exam Constitutional:      General: She is not in acute distress.    Appearance: Normal appearance. She is well-developed. She is obese. She is not ill-appearing or diaphoretic.  HENT:     Head: Normocephalic and atraumatic.      Right Ear: Hearing and external ear normal.     Left Ear: Hearing and external ear normal.     Nose: No nasal deformity or rhinorrhea.  Eyes:     General: No scleral icterus.    Conjunctiva/sclera: Conjunctivae normal.     Right eye: Right conjunctiva is not injected.     Left eye: Left conjunctiva is not injected.     Pupils: Pupils are equal, round, and reactive to light.  Neck:     Vascular: No JVD.  Cardiovascular:     Rate and Rhythm: Normal rate and regular rhythm.     Heart sounds: Normal heart sounds, S1 normal and S2 normal. No murmur heard.    No friction rub. No gallop.  Pulmonary:     Breath sounds: No stridor. No wheezing.  Abdominal:     General: There is no distension.     Palpations: Abdomen is soft.  Musculoskeletal:        General: Normal range of motion.     Right shoulder: Normal.     Left shoulder: Normal.     Cervical back: Normal range of motion and neck supple.     Right hip: Normal.     Left hip: Normal.     Right knee: Normal.     Left knee: Normal.  Lymphadenopathy:     Head:     Right side of head: No submandibular, preauricular or posterior auricular adenopathy.     Left side of head: No submandibular, preauricular or posterior auricular adenopathy.     Cervical: No cervical adenopathy.     Right cervical: No superficial or deep cervical adenopathy.    Left cervical: No superficial or deep cervical adenopathy.  Skin:    General: Skin is warm and dry.     Coloration: Skin is not pale.     Findings: No abrasion, bruising, ecchymosis, erythema, lesion or rash.     Nails: There is no clubbing.  Neurological:     Mental Status: She is alert.     Sensory: No sensory deficit.     Coordination: Coordination normal.     Gait: Gait normal.  Psychiatric:        Attention and Perception: She is attentive.  Mood and Affect: Mood normal.        Speech: Speech normal.        Behavior: Behavior normal. Behavior is cooperative.        Thought  Content: Thought content normal.        Cognition and Memory: Cognition is impaired. Memory is impaired. She exhibits impaired recent memory and impaired remote memory.        Judgment: Judgment normal.    Her pacemaker pocket itself is not overtly infected Lab Results Lab Results  Component Value Date   WBC 9.0 12/22/2023   HGB 8.4 (L) 12/22/2023   HCT 26.6 (L) 12/22/2023   MCV 93.0 12/22/2023   PLT 208 12/22/2023    Lab Results  Component Value Date   CREATININE 3.42 (H) 12/22/2023   BUN 38 (H) 12/22/2023   NA 134 (L) 12/22/2023   K 4.0 12/22/2023   CL 100 12/22/2023   CO2 27 12/22/2023    Lab Results  Component Value Date   ALT 12 12/20/2023   AST 14 (L) 12/20/2023   ALKPHOS 47 12/20/2023   BILITOT 0.3 12/20/2023     Microbiology: Recent Results (from the past 240 hours)  Blood culture (routine x 2)     Status: Abnormal (Preliminary result)   Collection Time: 12/20/23 12:58 AM   Specimen: BLOOD  Result Value Ref Range Status   Specimen Description BLOOD RIGHT ANTECUBITAL  Final   Special Requests   Final    BOTTLES DRAWN AEROBIC AND ANAEROBIC Blood Culture results may not be optimal due to an inadequate volume of blood received in culture bottles   Culture  Setup Time   Final    GRAM POSITIVE COCCI IN CHAINS IN BOTH AEROBIC AND ANAEROBIC BOTTLES CRITICAL RESULT CALLED TO, READ BACK BY AND VERIFIED WITH: PHARMD LYDIA CHEN ON 12/20/23 @ 1705 BY DRT    Culture (A)  Final    STREPTOCOCCUS MITIS/ORALIS SUSCEPTIBILITIES PERFORMED ON PREVIOUS CULTURE WITHIN THE LAST 5 DAYS. Performed at [facility], [address], [place], [place] [address]    Report Status PENDING  Incomplete  Blood culture (routine x 2)     Status: Abnormal (Preliminary result)   Collection Time: 12/20/23  1:03 AM   Specimen: BLOOD RIGHT HAND  Result Value Ref Range Status   Specimen Description BLOOD RIGHT HAND  Final   Special Requests   Final    BOTTLES DRAWN AEROBIC AND ANAEROBIC  Blood Culture adequate volume   Culture  Setup Time   Final    GRAM POSITIVE COCCI IN CHAINS IN BOTH AEROBIC AND ANAEROBIC BOTTLES CRITICAL RESULT CALLED TO, READ BACK BY AND VERIFIED WITH: PHARMD LYDIA CHEN ON 12/20/23 @ 1705 BY DRT Performed at [facility], [address] [place], [place] [address]    Culture STREPTOCOCCUS MITIS/ORALIS (A)  Final   Report Status PENDING  Incomplete   Organism ID, Bacteria STREPTOCOCCUS MITIS/ORALIS  Final      Susceptibility   Streptococcus mitis/oralis - MIC*    PENICILLIN  0.12 SENSITIVE Sensitive     CEFTRIAXONE  <=0.12 SENSITIVE Sensitive     LEVOFLOXACIN  1 SENSITIVE Sensitive     VANCOMYCIN 0.5 SENSITIVE Sensitive     * STREPTOCOCCUS MITIS/ORALIS  Blood Culture ID Panel (Reflexed)     Status: Abnormal   Collection Time: 12/20/23  1:03 AM  Result Value Ref Range Status   Enterococcus faecalis NOT DETECTED NOT DETECTED Final   Enterococcus Faecium NOT DETECTED NOT DETECTED  Final   Listeria monocytogenes NOT DETECTED NOT DETECTED Final   Staphylococcus species NOT DETECTED NOT DETECTED Final   Staphylococcus aureus (BCID) NOT DETECTED NOT DETECTED Final   Staphylococcus epidermidis NOT DETECTED NOT DETECTED Final   Staphylococcus lugdunensis NOT DETECTED NOT DETECTED Final   Streptococcus species DETECTED (A) NOT DETECTED Final    Comment: Not Enterococcus species, Streptococcus agalactiae, Streptococcus pyogenes, or Streptococcus pneumoniae. CRITICAL RESULT CALLED TO, READ BACK BY AND VERIFIED WITH: PHARMD LYDIA CHEN ON 12/20/23 @ 1705 BY DRT    Streptococcus agalactiae NOT DETECTED NOT DETECTED Final   Streptococcus pneumoniae NOT DETECTED NOT DETECTED Final   Streptococcus pyogenes NOT DETECTED NOT DETECTED Final   A.calcoaceticus-baumannii NOT DETECTED NOT DETECTED Final   Bacteroides fragilis NOT DETECTED NOT DETECTED Final   Enterobacterales NOT DETECTED NOT DETECTED Final   Enterobacter cloacae complex NOT DETECTED NOT DETECTED  Final   Escherichia coli NOT DETECTED NOT DETECTED Final   Klebsiella aerogenes NOT DETECTED NOT DETECTED Final   Klebsiella oxytoca NOT DETECTED NOT DETECTED Final   Klebsiella pneumoniae NOT DETECTED NOT DETECTED Final   Proteus species NOT DETECTED NOT DETECTED Final   Salmonella species NOT DETECTED NOT DETECTED Final   Serratia marcescens NOT DETECTED NOT DETECTED Final   Haemophilus influenzae NOT DETECTED NOT DETECTED Final   Neisseria meningitidis NOT DETECTED NOT DETECTED Final   Pseudomonas aeruginosa NOT DETECTED NOT DETECTED Final   Stenotrophomonas maltophilia NOT DETECTED NOT DETECTED Final   Candida albicans NOT DETECTED NOT DETECTED Final   Candida auris NOT DETECTED NOT DETECTED Final   Candida glabrata NOT DETECTED NOT DETECTED Final   Candida krusei NOT DETECTED NOT DETECTED Final   Candida parapsilosis NOT DETECTED NOT DETECTED Final   Candida tropicalis NOT DETECTED NOT DETECTED Final   Cryptococcus neoformans/gattii NOT DETECTED NOT DETECTED Final    Comment: Performed at [facility], [address], [place], [place] [address]  Remove and replace urinary cath (placed > 5 days) then obtain urine culture from new indwelling urinary catheter.     Status: Abnormal   Collection Time: 12/20/23  6:35 AM   Specimen: Urine, Catheterized  Result Value Ref Range Status   Specimen Description URINE, CATHETERIZED  Final   Special Requests   Final    NONE Performed at [facility], [address] [place], [place] [address]    Culture >=100,000 COLONIES/mL KLEBSIELLA PNEUMONIAE (A)  Final   Report Status 12/22/2023 FINAL  Final   Organism ID, Bacteria KLEBSIELLA PNEUMONIAE (A)  Final      Susceptibility   Klebsiella pneumoniae - MIC*    AMPICILLIN >=32 RESISTANT Resistant     CEFAZOLIN  (URINE) Value in next row Sensitive      4 SENSITIVEThis is a modified FDA-approved test that has been validated and its performance characteristics determined by the reporting  laboratory.  This laboratory is certified under the Clinical Laboratory Improvement Amendments CLIA as qualified to perform high complexity clinical laboratory testing.    CEFEPIME Value in next row Sensitive      4 SENSITIVEThis is a modified FDA-approved test that has been validated and its performance characteristics determined by the reporting laboratory.  This laboratory is certified under the Clinical Laboratory Improvement Amendments CLIA as qualified to perform high complexity clinical laboratory testing.    ERTAPENEM Value in next row Sensitive      4 SENSITIVEThis is a modified FDA-approved test that has been validated and its performance characteristics determined by  the reporting laboratory.  This laboratory is certified under the Clinical Laboratory Improvement Amendments CLIA as qualified to perform high complexity clinical laboratory testing.    CEFTRIAXONE  Value in next row Sensitive      4 SENSITIVEThis is a modified FDA-approved test that has been validated and its performance characteristics determined by the reporting laboratory.  This laboratory is certified under the Clinical Laboratory Improvement Amendments CLIA as qualified to perform high complexity clinical laboratory testing.    CIPROFLOXACIN  Value in next row Sensitive      4 SENSITIVEThis is a modified FDA-approved test that has been validated and its performance characteristics determined by the reporting laboratory.  This laboratory is certified under the Clinical Laboratory Improvement Amendments CLIA as qualified to perform high complexity clinical laboratory testing.    GENTAMICIN  Value in next row Sensitive      4 SENSITIVEThis is a modified FDA-approved test that has been validated and its performance characteristics determined by the reporting laboratory.  This laboratory is certified under the Clinical Laboratory Improvement Amendments CLIA as qualified to perform high complexity clinical laboratory testing.     NITROFURANTOIN Value in next row Sensitive      4 SENSITIVEThis is a modified FDA-approved test that has been validated and its performance characteristics determined by the reporting laboratory.  This laboratory is certified under the Clinical Laboratory Improvement Amendments CLIA as qualified to perform high complexity clinical laboratory testing.    TRIMETH /SULFA  Value in next row Sensitive      4 SENSITIVEThis is a modified FDA-approved test that has been validated and its performance characteristics determined by the reporting laboratory.  This laboratory is certified under the Clinical Laboratory Improvement Amendments CLIA as qualified to perform high complexity clinical laboratory testing.    AMPICILLIN/SULBACTAM Value in next row Intermediate      4 SENSITIVEThis is a modified FDA-approved test that has been validated and its performance characteristics determined by the reporting laboratory.  This laboratory is certified under the Clinical Laboratory Improvement Amendments CLIA as qualified to perform high complexity clinical laboratory testing.    PIP/TAZO Value in next row Sensitive      8 SENSITIVEThis is a modified FDA-approved test that has been validated and its performance characteristics determined by the reporting laboratory.  This laboratory is certified under the Clinical Laboratory Improvement Amendments CLIA as qualified to perform high complexity clinical laboratory testing.    MEROPENEM Value in next row Sensitive      8 SENSITIVEThis is a modified FDA-approved test that has been validated and its performance characteristics determined by the reporting laboratory.  This laboratory is certified under the Clinical Laboratory Improvement Amendments CLIA as qualified to perform high complexity clinical laboratory testing.    * >=100,000 COLONIES/mL KLEBSIELLA PNEUMONIAE    Jomarie Fleeta Rothman, MD [facility] [phone] pager   12/22/2023, 12:26 PM

## 2023-12-22 NOTE — Consult Note (Incomplete)
 ELECTROPHYSIOLOGY CONSULT NOTE    Patient ID: Amandajo Gonder MRN: [id number], DOB/AGE: [birth year] 78 y.o.  Admit date: 12/20/2023 Date of Consult: 12/23/2023  Primary Physician: Delores Suzann HERO, MD Primary Cardiologist: Jerel Balding, MD  Electrophysiologist: Dr. Balding   Referring Provider: Dr. Fleeta Rothman   Patient Profile: Brynlee Pennywell [place] is a 78 y.o. female with a history of CHB s/p PPM, obesity, OSA, CKD, COPD, GERD, Gout, paranoid schizophrenia, tardive dyskinesia who is being seen today for the evaluation of streptococcus mitis oralis bacteremia at the request of Dr. Fleeta Rothman.  HPI:  Remell Giaimo [place] is a 78 y.o. female who presented to [facility] with altered mental status, dysuria, N/V/D.    She was admitted per FPTS for further evaluation.  Blood cultures were positive for streptococcus mitis oralis.  Given presence of PPM, she was planned for TEE 12/23/23 which showed LVEF 50-55%, no evidence of vegetation.      She denies chest pain, palpitations, dyspnea, PND, orthopnea, nausea, vomiting, dizziness, syncope, edema, weight gain, or early satiety.   Labs Potassium4.1 (12/08 0200) Magnesium   1.9 (12/08 0200) Creatinine, ser  3.08* (12/08 0200) PLT  267 (12/08 0200) HGB  9.3* (12/08 0200) WBC 11.0* (12/08 0200)  .    Past Medical History:  Diagnosis Date   Abdominal wall hernia 01/2017   Advanced care planning/counseling discussion 09/29/2021   Atrial fibrillation (HCC)    CHF (congestive heart failure) (HCC)    CKD (chronic kidney disease)    COPD (chronic obstructive pulmonary disease) (HCC)    Coronary artery disease    NON-CRITICAL   Ectasis aorta    GERD (gastroesophageal reflux disease)    Gout    History of Sundowning 06/15/2023   Hypertension    Memory difficulties 12/02/2013   Pacemaker    Paranoid schizophrenia (HCC)    Postpolio syndrome (HCC) 06/15/2023   Psychosis (HCC)    Sick sinus syndrome (HCC) 04/24/2006   Has  pacemaker.         Tardive dyskinesia 04/26/2011   Type 2 diabetes mellitus [facility])      Surgical History:  Past Surgical History:  Procedure Laterality Date   CARDIAC CATHETERIZATION  2007   Non-critical.    CARDIOVERSION N/A 01/27/2021   Procedure: CARDIOVERSION;  Surgeon: Barbaraann Darryle Ned, MD;  Location: [facility] ENDOSCOPY;  Service: Cardiovascular;  Laterality: N/A;   CHOLECYSTECTOMY     ELBOW SURGERY     EP IMPLANTABLE DEVICE N/A 08/31/2014   Procedure: PPM Generator Changeout;  Surgeon: Jerel Balding, MD;  Location: [facility] INVASIVE CV LAB;  Service: Cardiovascular;  Laterality: N/A;   INSERT / REPLACE / REMOVE PACEMAKER  2007   [place]/SYMPTOMATIC HEART BLOCK   PACEMAKER PLACEMENT  09/28/2005   2/2 SSS?   Persantine stress test  05/01/2010   EF 66%. Normal LV sys fx. Unchanged from previous studies.    TRANSTHORACIC ECHOCARDIOGRAM  09/08/10   SEVERE CONCENTRIC HYPERTROPHY.LV FUNCTION WAS VIGOROUS.EF 65%-70%.VENTRICULAR SEPTUM-INCOORDINATE MOTION.LEFT ATRIUM-MILDLY DILATED.TRIVIAL TR.   TUBAL LIGATION       Medications Prior to Admission  Medication Sig Dispense Refill Last Dose/Taking   acetaminophen  (TYLENOL ) 500 MG tablet Take 500-1,000 mg by mouth every 6 (six) hours as needed for mild pain (pain score 1-3) or headache.   Past Week   albuterol  (VENTOLIN  HFA) 108 (90 Base) MCG/ACT inhaler Inhale 2 puffs into the lungs every 6 (six) hours as needed for wheezing or shortness of breath. 18 g 3 12/19/2023  Noon   allopurinol  (ZYLOPRIM ) 100 MG tablet Take 0.5 tablets (50 mg total) by mouth every other day. (Patient taking differently: Take 50 mg by mouth in the morning.) 60 tablet 3 12/19/2023 Morning   amLODipine  (NORVASC ) 10 MG tablet Take 1 tablet (10 mg total) by mouth at bedtime. (Patient taking differently: Take 10 mg by mouth daily.) 90 tablet 3 12/19/2023 Morning   apixaban  (ELIQUIS ) 2.5 MG TABS tablet Take 1 tablet (2.5 mg total) by mouth 2 (two) times daily. 180 tablet 3 12/19/2023    cholecalciferol  (VITAMIN D3) 25 MCG (1000 UNIT) tablet Take 1 tablet (1,000 Units total) by mouth daily. 90 tablet 3 12/19/2023 Morning   diclofenac  Sodium (VOLTAREN ) 1 % GEL Apply 2 g topically 4 (four) times daily as needed (pain). 350 g 2 Past Week   divalproex  (DEPAKOTE ) 250 MG DR tablet Take 1 tablet (250 mg total) by mouth every 12 (twelve) hours. 60 tablet 1 12/19/2023 Bedtime   Fluticasone -Umeclidin-Vilant (TRELEGY ELLIPTA ) 100-62.5-25 MCG/ACT AEPB Inhale 1 puff into the lungs daily. 60 each 3 12/19/2023 Morning   furosemide  (LASIX ) 40 MG tablet Take 2 tablets (80 mg total) by mouth every other day. 60 tablet 0 12/18/2023   ipratropium-albuterol  (DUONEB) 0.5-2.5 (3) MG/3ML SOLN Take 3 mLs by nebulization every 4 (four) hours as needed. (Patient taking differently: Take 3 mLs by nebulization See admin instructions. Nebulize 3 ml's and inhale into the lungs in the morning and evening- and an additional 3 ml's four times a day as needed for shortness of breath or wheezing) 360 mL 3 12/19/2023 Bedtime   metoprolol  tartrate (LOPRESSOR ) 100 MG tablet Take 1 tablet (100 mg total) by mouth 3 (three) times daily. 270 tablet 3 12/19/2023 Bedtime   Multiple Vitamins-Minerals (MULTIVITAMIN WOMEN 50+) TABS Take 1 tablet by mouth daily with breakfast.   12/19/2023 Morning   nitroGLYCERIN  (NITROSTAT ) 0.4 MG SL tablet Place 1 tablet (0.4 mg total) under the tongue every 5 (five) minutes as needed for chest pain. 25 tablet 6 Unknown   nystatin  cream (MYCOSTATIN ) Apply 1 Application topically daily as needed (Rash). (Patient taking differently: Apply 1 Application topically See admin instructions. Apply as directed to irritated areas of the right thigh and stomach in the morning and at bedtime) 30 g 1 12/19/2023 Bedtime   nystatin  powder Apply 1 Application topically daily. Apply after bathing. (Patient taking differently: Apply 1 Application topically See admin instructions. Apply as directed to irritated areas of the  right thigh and stomach in the morning and at bedtime) 15 g 2 12/19/2023 Bedtime   OLANZapine  (ZYPREXA ) 10 MG tablet Take 15 mg by mouth at bedtime.   12/19/2023 Bedtime   OVER THE COUNTER MEDICATION Apply 1 application  topically See admin instructions. Caldesene Medicated Protecting Powder- Apply under the breasts and between the skin folds 2 times a day as needed for irritation   Unknown   oxyCODONE  (ROXICODONE ) 5 MG immediate release tablet Take 0.5 tablets (2.5 mg total) by mouth every 12 (twelve) hours as needed for severe pain (pain score 7-10). 15 tablet 0 12/19/2023 Bedtime   polyethylene glycol powder (GLYCOLAX /MIRALAX ) 17 GM/SCOOP powder Take 17 g by mouth 3 (three) times daily. 3350 g 1 12/19/2023 Bedtime   pravastatin  (PRAVACHOL ) 40 MG tablet Take 1 tablet (40 mg total) by mouth every evening. (Patient taking differently: Take 40 mg by mouth at bedtime.) 90 tablet 0 12/19/2023 Bedtime   Semaglutide ,0.25 or 0.5MG /DOS, 2 MG/1.5ML SOPN Inject 0.25 mg into the skin once a week. (  Patient taking differently: Inject 0.25 mg into the skin every Tuesday.) 1.5 mL 3 12/17/2023   senna (SENOKOT) 8.6 MG TABS tablet Take 1 tablet (8.6 mg total) by mouth daily as needed for mild constipation. (Patient taking differently: Take 1 tablet by mouth daily.) 120 tablet 3 12/19/2023 Morning   sodium bicarbonate  650 MG tablet Take 1 tablet (650 mg total) by mouth 3 (three) times daily. 180 tablet 3 12/19/2023 Bedtime   triamcinolone  ointment (KENALOG ) 0.5 % Apply 1 Application topically 2 (two) times daily as needed (skin irritation). 60 g 1 Past Week   glucose blood (ACCU-CHEK GUIDE TEST) test strip Use to check blood glucose once daily and as needed for symptoms 100 each 12    Lancets (FREESTYLE) lancets Use to check blood glucose once daily and as needed for symptoms 100 each 12     Inpatient Medications:   allopurinol   50 mg Oral q AM   amLODipine   10 mg Oral Daily   apixaban   2.5 mg Oral BID    budesonide -glycopyrrolate -formoterol   2 puff Inhalation BID   Chlorhexidine  Gluconate Cloth  6 each Topical Daily   divalproex   250 mg Oral Q12H   furosemide   40 mg Intravenous Q6H   metoprolol  tartrate  100 mg Oral TID   nystatin    Topical BID   OLANZapine   15 mg Oral QHS   pravastatin   40 mg Oral QHS   predniSONE   40 mg Oral Q breakfast   sodium bicarbonate   650 mg Oral TID    Allergies:  Allergies  Allergen Reactions   Abilify [Aripiprazole] Anaphylaxis, Shortness Of Breath and Other (See Comments)    Tremors, also   Keflex  [Cephalexin ] Swelling and Other (See Comments)    Tongue swelling   Versacloz  [Clozapine ] Anaphylaxis   Ativan  [Lorazepam ] Other (See Comments)    Unknown reaction   Benadryl [Diphenhydramine Hcl] Other (See Comments)    Vision changes   Benztropine  Other (See Comments)    Mobility issues   Latuda [Lurasidone Hcl] Other (See Comments)    Tremors   Remeron [Mirtazapine] Other (See Comments)    Tremors   Haldol  [Haloperidol  Lactate] Other (See Comments)    Tardive dyskinesias  Tremors Somnolence Speech changes Sialorrhea    Codeine Other (See Comments)    Unknown reaction   Latex Rash    Family History  Problem Relation Age of Onset   Heart disease Mother    Heart disease Father    Stroke Sister    Diabetes type II Son      Physical Exam: Vitals:   12/23/23 1315 12/23/23 1320 12/23/23 1330 12/23/23 1412  BP: (!) 180/83 (!) 175/92 (!) 196/74 (!) 189/90  Pulse: 70 70 69 70  Resp: 18 18 (!) 25 20  Temp:    98.1 F (36.7 C)  TempSrc:    Oral  SpO2: 99% 96% 97% 96%  Weight:      Height:        GEN- chronically ill appearing adult female in NAD, A&O x 3, normal affect HEENT: Normocephalic, atraumatic Lungs- CTAB, Normal effort.  Heart- Regular rate and rhythm, No M/G/R.  GI- Soft, NT, ND.  Extremities- No clubbing, cyanosis, or edema   Radiology/Studies: EP STUDY Result Date: 12/23/2023 See surgical note for result.  CT ABDOMEN  PELVIS WO CONTRAST Result Date: 12/22/2023 EXAM: CT ABDOMEN AND PELVIS WITHOUT CONTRAST 12/22/2023 06:26:36 PM TECHNIQUE: CT of the abdomen and pelvis was performed without the administration of intravenous contrast. Multiplanar reformatted  images are provided for review. Automated exposure control, iterative reconstruction, and/or weight-based adjustment of the mA/kV was utilized to reduce the radiation dose to as low as reasonably achievable. COMPARISON: 10/28/2023 CLINICAL HISTORY: Sepsis FINDINGS: LOWER CHEST: Bibasilar atelectasis. Mild cardiomegaly. Pacemaker leads significant within the right ventricle towards the apex. Moderate right coronary artery calcification. LIVER: The liver is unremarkable. GALLBLADDER AND BILE DUCTS: Status post cholecystectomy. No biliary ductal dilatation. SPLEEN: No acute abnormality. PANCREAS: No acute abnormality. ADRENAL GLANDS: No acute abnormality. KIDNEYS, URETERS AND BLADDER: The kidneys are atrophic bilaterally. Multiple extensive cortical lesions were identified, the majority of which were compatible with simple cortical cysts. However, several of which are relatively hyperdense and are indeterminate on the single-phase examination. If indicated, this will be better assessed with a dedicated renal mass protocol CT or MRI examination when the patient's acute issues have resolved. No stones in the kidneys or ureters. No hydronephrosis. No perinephric or periureteral stranding. The bladder is decompressed with a foley catheter balloon seen within its lumen. GI AND BOWEL: A large umbilical hernia extends the majority of the transverse colon. The transverse colon is unremarkable. Mild diverticulosis noted within a redundant sigmoid colon. The stomach, small bowel, and large bowel are otherwise unremarkable. Appendix absent. No evidence of obstruction. PERITONEUM AND RETROPERITONEUM: No ascites. No free air. VASCULATURE: Extensive aortoiliac atherosclerotic calcification.  Superimposed fusiform 3.9 cm infrarenal abdominal aortic aneurysm, stable since prior examination. LYMPH NODES: No lymphadenopathy. REPRODUCTIVE ORGANS: No acute abnormality. BONES AND SOFT TISSUES: Advanced degenerative changes are noted asymmetrically within the right hip. Osseous structures are otherwise age appropriate. No acute bone abnormality. No focal soft tissue abnormality. IMPRESSION: 1. Stable 3.9 cm infrarenal abdominal aortic aneurysm with extensive aortoiliac atherosclerotic calcification, recommend ultrasound follow-up in 3 years per society for vascular surgery guidelines. 2. Atrophic kidneys with multiple exophytic cortical lesions, several of which are indeterminate on this single-phase exam, recommend dedicated renal mass protocol CT or MRI when feasible, if clinically indicated . 3. Large umbilical hernia containing the majority of the transverse colon. No obstruction. 4. Mild diverticulosis within a redundant sigmoid colon without superimposed inflammatory change 5. Mild cardiomegaly. 6. Moderate right coronary artery calcification. 7. Advanced degenerative changes of the right hip. Electronically signed by: Dorethia Molt MD 12/22/2023 08:30 PM EST RP Workstation: [id number]   ECHOCARDIOGRAM COMPLETE Result Date: 12/20/2023    ECHOCARDIOGRAM REPORT   Patient Name:   Sherron Mapp [facility] Date of Exam: 12/20/2023 Medical Rec #:  [id number]               Height:       66.0 in Accession #:    [id number]              Weight:       325.4 lb Date of Birth:  Jan 20, 1945               BSA:          2.459 m Patient Age:    78 years                BP:           134/102 mmHg Patient Gender: F                       HR:           70 bpm. Exam Location:  Inpatient Procedure: 2D Echo (Both Spectral and Color Flow Doppler were utilized during  procedure). Indications:    CHF  History:        Patient has prior history of Echocardiogram examinations. CHF.  Sonographer:    Charmaine Gaskins Referring  Phys: FAIRY AMY  Sonographer Comments: Technically challenging study due to limited acoustic windows, Technically difficult study due to poor echo windows and no subcostal window. Image acquisition challenging due to patient body habitus. IMPRESSIONS  1. No subcostal views; distal septum and apex wall motion c/w pacing; overall low normal LV function.  2. Left ventricular ejection fraction, by estimation, is 50 to 55%. The left ventricle has low normal function. The left ventricle has no regional wall motion abnormalities. There is severe left ventricular hypertrophy of the basal-septal segment. Left ventricular diastolic parameters are indeterminate.  3. Right ventricular systolic function is normal. The right ventricular size is normal.  4. Left atrial size was moderately dilated.  5. Right atrial size was moderately dilated.  6. The mitral valve is normal in structure. No evidence of mitral valve regurgitation. No evidence of mitral stenosis.  7. The aortic valve has an indeterminant number of cusps. Aortic valve regurgitation is not visualized. No aortic stenosis is present. FINDINGS  Left Ventricle: Left ventricular ejection fraction, by estimation, is 50 to 55%. The left ventricle has low normal function. The left ventricle has no regional wall motion abnormalities. The left ventricular internal cavity size was normal in size. There is severe left ventricular hypertrophy of the basal-septal segment. Abnormal (paradoxical) septal motion, consistent with RV pacemaker. Left ventricular diastolic parameters are indeterminate. Right Ventricle: The right ventricular size is normal. Right ventricular systolic function is normal. Left Atrium: Left atrial size was moderately dilated. Right Atrium: Right atrial size was moderately dilated. Pericardium: There is no evidence of pericardial effusion. Mitral Valve: The mitral valve is normal in structure. Mild mitral annular calcification. No evidence of mitral valve  regurgitation. No evidence of mitral valve stenosis. MV peak gradient, 7.8 mmHg. The mean mitral valve gradient is 3.0 mmHg. Tricuspid Valve: The tricuspid valve is normal in structure. Tricuspid valve regurgitation is trivial. No evidence of tricuspid stenosis. Aortic Valve: The aortic valve has an indeterminant number of cusps. Aortic valve regurgitation is not visualized. No aortic stenosis is present. Aortic valve mean gradient measures 4.0 mmHg. Aortic valve peak gradient measures 8.1 mmHg. Pulmonic Valve: The pulmonic valve was normal in structure. Pulmonic valve regurgitation is not visualized. No evidence of pulmonic stenosis. Aorta: The aortic root is normal in size and structure. Venous: The inferior vena cava was not well visualized. IAS/Shunts: The interatrial septum was not well visualized. Additional Comments: No subcostal views; distal septum and apex wall motion c/w pacing; overall low normal LV function. A device lead is visualized.  LEFT VENTRICLE PLAX 2D LVIDd:         3.80 cm     Diastology LVIDs:         3.00 cm     LV e' medial:    5.00 cm/s LV PW:         1.20 cm     LV E/e' medial:  20.2 LV IVS:        1.80 cm     LV e' lateral:   7.72 cm/s                            LV E/e' lateral: 13.1  LV Volumes (MOD) LV vol d, MOD A2C: 92.5 ml LV vol d, MOD  A4C: 89.6 ml LV vol s, MOD A2C: 60.9 ml LV vol s, MOD A4C: 61.9 ml LV SV MOD A2C:     31.6 ml LV SV MOD A4C:     89.6 ml LV SV MOD BP:      29.2 ml RIGHT VENTRICLE RV Basal diam:  4.00 cm RV Mid diam:    3.20 cm RV S prime:     9.46 cm/s TAPSE (M-mode): 1.8 cm LEFT ATRIUM              Index        RIGHT ATRIUM           Index LA Vol (A2C):   98.4 ml  40.01 ml/m  RA Area:     28.60 cm LA Vol (A4C):   94.2 ml  38.30 ml/m  RA Volume:   102.00 ml 41.47 ml/m LA Biplane Vol: 102.0 ml 41.47 ml/m  AORTIC VALVE                   PULMONIC VALVE AV Vmax:           142.00 cm/s PV Vmax:       0.86 m/s AV Vmean:          96.000 cm/s PV Peak grad:  2.9 mmHg AV  VTI:            0.276 m AV Peak Grad:      8.1 mmHg AV Mean Grad:      4.0 mmHg LVOT Vmax:         90.20 cm/s LVOT Vmean:        52.100 cm/s LVOT VTI:          0.153 m LVOT/AV VTI ratio: 0.55  AORTA Ao Asc diam: 3.40 cm MITRAL VALVE MV Area (PHT): 4.89 cm     SHUNTS MV Peak grad:  7.8 mmHg     Systemic VTI: 0.15 m MV Mean grad:  3.0 mmHg MV Vmax:       1.40 m/s MV Vmean:      81.7 cm/s MV Decel Time: 155 msec MV E velocity: 101.00 cm/s MV A velocity: 56.10 cm/s MV E/A ratio:  1.80 Redell Shallow MD Electronically signed by Redell Shallow MD Signature Date/Time: 12/20/2023/3:18:05 PM    Final    CT HEAD WO CONTRAST ( ) Result Date: 12/20/2023 EXAM: CT HEAD WITHOUT CONTRAST 12/20/2023 06:03:05 AM TECHNIQUE: CT of the head was performed without the administration of intravenous contrast. Automated exposure control, iterative reconstruction, and/or weight based adjustment of the mA/kV was utilized to reduce the radiation dose to as low as reasonably achievable. COMPARISON: 02/28/2022 CLINICAL HISTORY: Mental status change, unknown cause FINDINGS: BRAIN AND VENTRICLES: No acute hemorrhage. No evidence of acute infarct. Prominence of the sulci and ventricles compatible with brain atrophy. Unchanged appearance of small left frontal meningioma measuring 8 mm, image 33/6. No hydrocephalus. No extra-axial collection. No mass effect or midline shift. ORBITS: No acute abnormality. SINUSES: No acute abnormality. SOFT TISSUES AND SKULL: No acute soft tissue abnormality. No skull fracture. IMPRESSION: 1. No acute intracranial abnormality. 2. Unchanged small left frontal meningioma measuring 8 mm. 3. Prominent sulci and ventricles compatible with brain atrophy. Electronically signed by: Waddell Calk MD 12/20/2023 06:10 AM EST RP Workstation: [id number]   DG Chest Portable 1 View Result Date: 12/20/2023 CLINICAL DATA:  Nausea, vomiting and diarrhea. EXAM: PORTABLE CHEST 1 VIEW COMPARISON:  November 24, 2023 FINDINGS: There  is stable dual lead AICD lead positioning.  The cardiac silhouette is enlarged and unchanged in size. There is marked severity calcification of the thoracic aorta. Mild prominence of the pulmonary vasculature is seen. Diffuse, chronic appearing increased interstitial lung markings are noted. No focal consolidation, pleural effusion or pneumothorax is identified. The visualized skeletal structures are unremarkable. IMPRESSION: Cardiomegaly with mild pulmonary vascular congestion. Electronically Signed   By: Suzen Dials M.D.   On: 12/20/2023 01:07   DG HIP UNILAT WITH PELVIS 2-3 VIEWS LEFT Result Date: 11/25/2023 CLINICAL DATA:  Bilateral hip pain EXAM: DG HIP (WITH OR WITHOUT PELVIS) 2-3V LEFT COMPARISON:  CT pelvis 10/28/2023 FINDINGS: Moderate degenerative left hip arthropathy primarily with axial and craniocaudad articular space narrowing, and only mild spurring. Although the articular space narrowing is substantial, the overall degree of arthropathy is markedly less than that on the right side. IMPRESSION: 1. Moderate degenerative left hip arthropathy. Electronically Signed   By: Ryan Salvage M.D.   On: 11/25/2023 19:30   DG HIP UNILAT WITH PELVIS 2-3 VIEWS RIGHT Result Date: 11/25/2023 CLINICAL DATA:  Back pain and bilateral hip pain EXAM: DG HIP (WITH OR WITHOUT PELVIS) 2-3V*R* COMPARISON:  CT pelvis 10/28/2023 FINDINGS: Markedly severe chronic right hip arthropathy with severe spurring, moderate flattening and volume loss in the femoral head, prominent articular space narrowing, and subcortical lucencies along the femoral head and acetabulum favoring degenerative subcortical cystic lesions. No acute fracture identified. Body habitus reduces diagnostic sensitivity and specificity. IMPRESSION: 1. Markedly severe chronic right hip arthropathy with severe spurring, moderate flattening and volume loss in the femoral head, and subcortical lucencies favoring degenerative subcortical cystic lesions.  Electronically Signed   By: Ryan Salvage M.D.   On: 11/25/2023 19:29   DG Lumbar Spine 2-3 Views Result Date: 11/25/2023 CLINICAL DATA:  Back pain EXAM: LUMBAR SPINE - 2-3 VIEW COMPARISON:  CT scan of the abdomen from 10/28/2023 FINDINGS: Body habitus reduces diagnostic sensitivity and specificity. Calcified infrarenal abdominal aortic aneurysm, better measured on prior CT due to the lack of magnification affects, and measured at about 3.5 cm in diameter on that exam. Aortic atherosclerosis. Transitional L5 vertebra with broad right transverse process that pseudo articulates with the sacrum. Stable grade 1 anterolisthesis at L4-5. Preserved intervertebral disc height. No fracture or acute bony findings identified. There is degenerative facet arthropathy at L4-5. IMPRESSION: 1. No acute findings. 2. Transitional L5 vertebra with broad right transverse process that pseudo articulates with the sacrum. 3. Stable grade 1 anterolisthesis at L4-5. 4. Degenerative facet arthropathy at L4-5. 5. Calcified infrarenal abdominal aortic aneurysm, better measured on prior CT due to the lack of magnification affects, and measured at about 3.5 cm in diameter on that exam. Recommend follow-up ultrasound every 2 years. This recommendation follows ACR consensus guidelines: White Paper of the ACR Incidental Findings Committee II on Vascular Findings. J Am Coll Radiol 2013; 10:789-794. Electronically Signed   By: Ryan Salvage M.D.   On: 11/25/2023 19:28   ECHOCARDIOGRAM COMPLETE Result Date: 11/24/2023    ECHOCARDIOGRAM REPORT   Patient Name:   Jessabelle Markiewicz [facility] Date of Exam: 11/24/2023 Medical Rec #:  [id number]               Height:       66.0 in Accession #:    [id number]              Weight:       277.1 lb Date of Birth:  Jul 22, 1945  BSA:          2.297 m Patient Age:    78 years                BP:           169/79 mmHg Patient Gender: F                       HR:           72 bpm. Exam Location:   Inpatient Procedure: 2D Echo, Cardiac Doppler and Color Doppler (Both Spectral and Color            Flow Doppler were utilized during procedure). Indications:    CHF I50.9  History:        Patient has prior history of Echocardiogram examinations, most                 recent 06/16/2023. COPD and CKD, stage 3, Arrythmias:Atrial                 Fibrillation, Signs/Symptoms:Dyspnea; Risk Factors:Hypertension,                 Sleep Apnea and Diabetes.  Sonographer:    Thea Norlander RCS Referring Phys: DAMIEN CASSIS  Sonographer Comments: Technically challenging study due to limited acoustic windows, Technically difficult study due to poor echo windows, no parasternal window, no apical window, no subcostal window and patient is obese.  FINDINGS  Patient refused study, very visually limited nondiagnostic images were obtained. Nondiagnostic study Dorn Ross MD Electronically signed by Dorn Ross MD Signature Date/Time: 11/24/2023/3:57:37 PM    Final    DG Chest Portable 1 View Result Date: 11/24/2023 EXAM: 1 VIEW(S) XRAY OF THE CHEST 11/24/2023 06:23:00 AM COMPARISON: 10/28/2023 CLINICAL HISTORY: SOB. FINDINGS: LUNGS AND PLEURA: Diffuse pulmonary vascular congestion. No focal pulmonary opacity. No pulmonary edema. No pleural effusion. No pneumothorax. HEART AND MEDIASTINUM: A left chest wall pacer device is noted with leads overlying the right atrium and right ventricle. The atrial lead is oriented slightly more caudal compared with the previous exam. Stable cardiac enlargement. Prominent opacification along the left heart border is noted, which appears similar to previous imaging and is favored to represent prominent epicardial fat combined with cardiac enlargement and rotational artifact. BONES AND SOFT TISSUES: A left chest wall pacer device is noted. No acute osseous abnormality. IMPRESSION: 1. Diffuse pulmonary vascular congestion. 2. Left chest wall dual lead pacer device is again noted. The atrial lead  appears oriented slightly more caudal compared with the previous exam. Correlate for any clinical signs or symptoms of fallen lead. Electronically signed by: Waddell Calk MD 11/24/2023 06:33 AM EST RP Workstation: [id number]    EKG:12/20/23 > VP 60 bpm (personally reviewed)  TELEMETRY: VP 60-80's (personally reviewed)  DEVICE HISTORY:  MDT Dual Chamber PPM implanted for CHB.  RA 04/28/05 / RV 09/28/05 Generator Change > 08/31/14 (Dr. Francyne)  Assessment/Plan:  Streptococcus Mitis Oralis Bacteremia  MDT Dual Chamber PPM, R/O CEID Infection  -TEE negative for vegetation  -initial cultures 12/20/23 with strep mitis / oralis > repeat on 12/22/23 with NGTD.  Follow cultures to maturity  -son indicates she is bed bound at home / does not walk, has a chronic indwelling foley catheter.  We discussed her co-morbid conditions.  Given baseline functional status and risks of extraction given age of leads, would not likely consider her an extraction candidate, final determination per MD  -consider life long suppressive abx -continue  abx per ID      For questions or updates, please contact [facility] Please consult [email or web address] for contact info under     Signed, Daphne Barrack, NP-C, AGACNP-BC [facility] - Electrophysiology  12/23/2023, 4:39 PM   I have seen, examined the patient, and reviewed the above assessment and plan.    HPI: Abigail Wallace is a 78 y.o. female with a history of CHB s/p PPM, obesity, OSA, CKD, COPD, GERD, Gout, paranoid schizophrenia, tardive dyskinesia who was brought to the ED by her son for altered mental status, dysuria, nausea, vomiting, diarrhea.  Patient was found to have Streptococcus oralis bacteremia.  Much of history provided by patient's son.  Patient he states that patient is bedbound at home and requires continuous care.  She has chronic indwelling Foley catheter.  Functional status is quite poor.  General: Chronically  ill-appearing, in no acute distress.  Neck: No JVD.  Cardiac: Normal rate, regular rhythm.  Resp: Normal work of breathing.  GI: Obese Ext: No edema.  Neuro: No gross focal deficits.  Oriented to person.  TEE 12/23/23  1. Left ventricular ejection fraction, by estimation, is 50 to 55%. The  left ventricle has low normal function. The left ventricle has no regional  wall motion abnormalities.   2. Right ventricular systolic function is normal. The right ventricular  size is normal.   3. Left atrial size was moderately dilated. No left atrial/left atrial  appendage thrombus was detected.   4. Right atrial size was moderately dilated. There is a device lead in  the RA that appears thickened but no obvious shaggy vegetation.   5. Redundant chordae tendinae. The mitral valve is normal in structure.  Trivial mitral valve regurgitation.   6. The aortic valve is normal in structure. Aortic valve regurgitation is  not visualized.   7. There is mild (Grade II) layered plaque involving the ascending aorta  and aortic arch.   Assessment and Plan:  Mrs. Analis Distler is an 78 year old female with past medical history notable for CHB s/p PPM, morbid obesity, OSA, CKD, COPD, GERD, Gout, paranoid schizophrenia, tardive dyskinesia who is currently admitted with Streptococcus mitis oralis bacteremia.  EP was consulted regarding management of her pacemaker in setting of strep bacteremia.  There were no vegetations seen on her pacing leads during TEE.  Patient has baseline poor functional status.  She is bedbound, she has cognitive decline and chronic indwelling Foley catheter.  She is dependent on ventricular pacing.  Her leads are from 2007.  This would be a high risk extraction.  I suspect that she would not be a surgical backup candidate.  Additionally, she would need reimplantation given her dependent nature.  This would require leadless pacemaker implant, which again would be very high risk.  We  discussed all of this with the patient and her son.  They favor conservative management, which I feel is reasonable.  Recommend antibiotic suppression per ID recommendations.   Fonda Kitty, MD, [facility], [facility] Cardiac Electrophysiology

## 2023-12-22 NOTE — Assessment & Plan Note (Addendum)
 Possible dry weight 135 to 145 kg.  EF 50 to 55% this hospitalization, historically difficult to evaluate volume status due to obesity.  Some crackles and pitting edema today on exam, respiratory status unchanged from yesterday. Creatinine trending up today 3.42 from 3.34, baseline appears to be 2.8.  30% increase of baseline creatinine 2.8 would be 3.6 for AKI, suspect likely 2/2 diuresis VS decreased prerenal supply from CHF exacerbation given patient's increase in weight.  Will continue to diurese until AKI criteria.  Did get her Breztri  over the last 24 hours, scheduled albuterol  switch to as needed by RT yesterday.  Questionably baseline oxygen  of 2 L, son states she uses 2 L as needed at home and often does not need it, but was using it more over the last 2 weeks. - Continue lasix  40 mg IV twice today - Home diuresis Lasix  80 mg every other day, consider changing to 40 mg daily - albuterol  nebs every 6 hours as needed - Continue prednisone  40 mg for 5 days (12/6-12/10) - continue scheduled breztri  inhaler - strict I/Os, -510 cc last 24 hours with 2 L urine output - daily weights, 153.4 kg today - Saturation goal 88-92% with COPD - 2L [place] O2 sats >90% overnight - CPAP at night, she refused last night

## 2023-12-22 NOTE — Plan of Care (Signed)
  Problem: Education: Goal: Knowledge of General Education information will improve Description: Including pain rating scale, medication(s)/side effects and non-pharmacologic comfort measures Outcome: Progressing   Problem: Clinical Measurements: Goal: Diagnostic test results will improve Outcome: Progressing   Problem: Coping: Goal: Level of anxiety will decrease Outcome: Progressing   Problem: Pain Managment: Goal: General experience of comfort will improve and/or be controlled Outcome: Progressing   Problem: Safety: Goal: Ability to remain free from injury will improve Outcome: Progressing

## 2023-12-23 ENCOUNTER — Other Ambulatory Visit (HOSPITAL_COMMUNITY): Payer: Self-pay

## 2023-12-23 ENCOUNTER — Inpatient Hospital Stay (HOSPITAL_COMMUNITY): Admitting: Anesthesiology

## 2023-12-23 ENCOUNTER — Inpatient Hospital Stay (HOSPITAL_COMMUNITY)

## 2023-12-23 ENCOUNTER — Telehealth: Payer: Self-pay

## 2023-12-23 ENCOUNTER — Encounter (HOSPITAL_COMMUNITY): Admission: EM | Disposition: A | Payer: Self-pay | Source: Home / Self Care | Attending: Family Medicine

## 2023-12-23 ENCOUNTER — Telehealth (HOSPITAL_COMMUNITY): Payer: Self-pay

## 2023-12-23 DIAGNOSIS — B955 Unspecified streptococcus as the cause of diseases classified elsewhere: Secondary | ICD-10-CM | POA: Diagnosis not present

## 2023-12-23 DIAGNOSIS — R7881 Bacteremia: Secondary | ICD-10-CM | POA: Diagnosis not present

## 2023-12-23 DIAGNOSIS — I330 Acute and subacute infective endocarditis: Secondary | ICD-10-CM

## 2023-12-23 HISTORY — PX: TRANSESOPHAGEAL ECHOCARDIOGRAM (CATH LAB): EP1270

## 2023-12-23 LAB — BASIC METABOLIC PANEL WITH GFR
Anion gap: 10 (ref 5–15)
BUN: 42 mg/dL — ABNORMAL HIGH (ref 8–23)
CO2: 26 mmol/L (ref 22–32)
Calcium: 9.1 mg/dL (ref 8.9–10.3)
Chloride: 100 mmol/L (ref 98–111)
Creatinine, Ser: 3.08 mg/dL — ABNORMAL HIGH (ref 0.44–1.00)
GFR, Estimated: 15 mL/min — ABNORMAL LOW (ref >60)
Glucose, Bld: 214 mg/dL — ABNORMAL HIGH (ref 70–99)
Potassium: 4.1 mmol/L (ref 3.5–5.1)
Sodium: 136 mmol/L (ref 135–145)

## 2023-12-23 LAB — MAGNESIUM: Magnesium: 1.9 mg/dL (ref 1.7–2.4)

## 2023-12-23 LAB — PROTIME-INR
INR: 1.2 (ref 0.8–1.2)
Prothrombin Time: 15.5 s — ABNORMAL HIGH (ref 11.4–15.2)

## 2023-12-23 LAB — CBC
HCT: 29.2 % — ABNORMAL LOW (ref 36.0–46.0)
Hemoglobin: 9.3 g/dL — ABNORMAL LOW (ref 12.0–15.0)
MCH: 29.4 pg (ref 26.0–34.0)
MCHC: 31.8 g/dL (ref 30.0–36.0)
MCV: 92.4 fL (ref 80.0–100.0)
Platelets: 267 K/uL (ref 150–400)
RBC: 3.16 MIL/uL — ABNORMAL LOW (ref 3.87–5.11)
RDW: 14 % (ref 11.5–15.5)
WBC: 11 K/uL — ABNORMAL HIGH (ref 4.0–10.5)
nRBC: 0 % (ref 0.0–0.2)

## 2023-12-23 LAB — ECHO TEE

## 2023-12-23 SURGERY — TRANSESOPHAGEAL ECHOCARDIOGRAM (TEE) (CATHLAB)
Anesthesia: Monitor Anesthesia Care

## 2023-12-23 MED ORDER — CALCIUM POLYCARBOPHIL 625 MG PO TABS
625.0000 mg | ORAL_TABLET | Freq: Every day | ORAL | Status: DC
  Administered 2023-12-25 – 2023-12-27 (×3): 625 mg via ORAL
  Filled 2023-12-23 (×5): qty 1

## 2023-12-23 MED ORDER — FUROSEMIDE 10 MG/ML IJ SOLN
40.0000 mg | Freq: Four times a day (QID) | INTRAMUSCULAR | Status: AC
  Administered 2023-12-23 (×2): 40 mg via INTRAVENOUS
  Filled 2023-12-23 (×2): qty 4

## 2023-12-23 MED ORDER — SODIUM CHLORIDE 0.9 % IV SOLN: INTRAVENOUS | Status: DC

## 2023-12-23 MED ORDER — SODIUM CHLORIDE 0.9 % IV SOLN: INTRAVENOUS | Status: DC | PRN

## 2023-12-23 MED ORDER — LOPERAMIDE HCL 2 MG PO CAPS
2.0000 mg | ORAL_CAPSULE | ORAL | Status: DC | PRN
  Administered 2023-12-23 – 2023-12-25 (×5): 2 mg via ORAL
  Filled 2023-12-23 (×5): qty 1

## 2023-12-23 MED ORDER — PROPOFOL 500 MG/50ML IV EMUL
INTRAVENOUS | Status: DC | PRN
  Administered 2023-12-23: 100 ug/kg/min via INTRAVENOUS

## 2023-12-23 NOTE — Assessment & Plan Note (Addendum)
 VSS, intermittent diastolic hypotension, held metoprolol  overnight.  Leukocytosis up slightly to 11. Blood culture grew strep species, in both aerobic and anaerobic bottles.  - urine culture growing Klebsiella pneumonia, resistant to amp and Unasyn - Blood culture pansensitive - Continue ceftriaxone  - ID consulted, appreciate all their recs  - 4 to 6 weeks of IV antibiotics, possible chronic suppressive antibiotics if not good pacemaker removal candidate. - CTAP negative for any intra-abdominal abscesses or other sites of infection - Consult cardiology, TEE pending - EP also following for evaluation of pacemaker - AM CBC, BMP, mag - PT/OT, home health PT

## 2023-12-23 NOTE — Telephone Encounter (Signed)
 Pharmacy Patient Advocate Encounter  Insurance verification completed.    The patient is insured through [facility]. Patient has [employer], may use a copay card, and/or apply for patient assistance if available.    Ran test claim for Eliquis  5mg  tablet and the current 30 day co-pay is $0.   This test claim was processed through [employer]- copay amounts may vary at other pharmacies due to [employer], or as the patient moves through the different stages of their insurance plan.

## 2023-12-23 NOTE — Anesthesia Postprocedure Evaluation (Signed)
 Anesthesia Post Note  Patient: Gianella Chismar [facility]  Procedure(s) Performed: TRANSESOPHAGEAL ECHOCARDIOGRAM     Patient location during evaluation: Cath Lab Anesthesia Type: MAC Level of consciousness: awake and alert Pain management: pain level controlled Vital Signs Assessment: post-procedure vital signs reviewed and stable Respiratory status: spontaneous breathing, nonlabored ventilation, respiratory function stable and patient connected to nasal cannula oxygen  Cardiovascular status: stable and blood pressure returned to baseline Postop Assessment: no apparent nausea or vomiting Anesthetic complications: no   There were no known notable events for this encounter.  Last Vitals:  Vitals:   12/23/23 1215 12/23/23 1300  BP: (!) 166/73 (!) 154/101  Pulse: 81 73  Resp: 19 15  Temp: 36.8 C 36.7 C  SpO2: 94% 99%    Last Pain:  Vitals:   12/23/23 1300  TempSrc: Temporal  PainSc: 0-No pain                 Garnette DELENA Gab

## 2023-12-23 NOTE — Anesthesia Procedure Notes (Signed)
 Procedure Name: General with mask airway Date/Time: 12/23/2023 12:52 PM  Performed by: Arvell Edsel HERO, CRNAPre-anesthesia Checklist: Patient identified, Emergency Drugs available, Suction available, Patient being monitored and Timeout performed Oxygen  Delivery Method: Simple face mask

## 2023-12-23 NOTE — Assessment & Plan Note (Signed)
 CKDIV - continue home sodium bicarb, avoid nephrotoxic agents.  pAF -  continue home eliquis , metoprolol , atrial paced on telemetry Schizophrenia - continue home olanzapine , depakote  Urinary retention - secondary to polio, chronic Foley Gout - continue home allopurinol  HTN - continue home amlodipine 

## 2023-12-23 NOTE — Telephone Encounter (Signed)
 Patient is currently admitted. Will defer at this time.

## 2023-12-23 NOTE — Progress Notes (Addendum)
 Daily Progress Note Intern Pager: [phone]  Patient name: Abigail Wallace [facility] Medical record number: [id number] Date of birth: 01-18-1945 Age: 78 y.o. Gender: female  Primary Care Provider: Delores Suzann HERO, MD Consultants: Cardiology Code Status: Full code  Pt Overview and Major Events to Date:  12/5-admitted 12/8-TEE  Medical Decision Making:  This is an 78 year old female with PMH of CKD, pAfib, schizophrenia, HTN, OSA, COPD who is admitted for AMS, UTI bacteremia, exacerbation CHF exacerbation and acute COPD exacerbation.  Overall doing well today, symptoms gradually improving Assessment & Plan Streptococcal bacteremia Altered mental status UTI (urinary tract infection) CKD stage IIIb Indwelling catheter present on admission Subacute bacterial endocarditis Pacemaker infection VSS, intermittent diastolic hypotension, held metoprolol  overnight.  Leukocytosis up slightly to 11. Blood culture grew strep species, in both aerobic and anaerobic bottles.  - urine culture growing Klebsiella pneumonia, resistant to amp and Unasyn - Blood culture pansensitive - Continue ceftriaxone  - ID consulted, appreciate all their recs  - 4 to 6 weeks of IV antibiotics, possible chronic suppressive antibiotics if not good pacemaker removal candidate. - CTAP negative for any intra-abdominal abscesses or other sites of infection - Consult cardiology, TEE pending - EP also following for evaluation of pacemaker - AM CBC, BMP, mag - PT/OT, home health PT Acute on chronic diastolic heart failure (HCC) Chronic obstructive pulmonary disease (HCC) Obstructive sleep apnea Dry weight 135 to 145 kg, 150.2 kg today.  Creatinine baseline appears to be around 2.8, trending down 3.08 from 3.42 yesterday. Will continue to diurese until AKI criteria.  Questionable oxygen  baseline of 2 L, saturating well overnight. - Redose lasix  40 mg IV twice today - strict I/Os, -2835, 3.075 cc of urine output - daily  weights, 150.2 kg today, down from 153.4 kg - 2L [place] O2 sats >90% overnight - Continue prednisone  40 mg for 5 days (12/6-12/10) - Continue other meds, Breztri  and albuterol  q6h PRN - replete electrolytes to maintain Mg >2 and K >4 - CPAP at night, she refused last night Chronic health problem CKDIV - continue home sodium bicarb, avoid nephrotoxic agents.  pAF -  continue home eliquis , metoprolol , atrial paced on telemetry Schizophrenia - continue home olanzapine , depakote  Urinary retention - secondary to polio, chronic Foley Gout - continue home allopurinol  HTN - continue home amlodipine    FEN/GI: Renal/carb modified PPx: Home Eliquis  Dispo:Home declined Home with home health PT services. Barriers include endocarditis workup.   Subjective:  Saw patient at bedside with her son Abigail Wallace, he was very concerned about her possibly getting the TEE done before he arrived to the hospital.  We sorted out the confusion and he was understanding.  Patient reports abdominal pain and nausea remains improved today, though her feet are feeling very cold and sweaty.  Denies any chest pain, shortness of breath, other diaphoresis, abdominal pain.  Objective: Temp:  [98 F (36.7 C)-98.8 F (37.1 C)] 98.1 F (36.7 C) (12/08 0454) Pulse Rate:  [60-72] 60 (12/08 0454) Resp:  [16-19] 18 (12/08 0454) BP: (123-152)/(46-91) 134/53 (12/08 0454) SpO2:  [93 %-96 %] 94 % (12/08 0454) FiO2 (%):  [30 %] 30 % (12/07 1928) Weight:  [150.2 kg] 150.2 kg (12/08 0452) Physical Exam: General: Obese and frail lying left lateral decubitus Cardiovascular: Regular rate rhythm, no murmurs rubs or gallops Respiratory: Diminished air movement, lungs clear to auscultation bilaterally, no wheezes crackles or rhonchi Abdomen: Obese pannus with large nonreducible umbilical hernia, nontender, no rebound or guarding Extremities: 2+ pitting  edema distal 1/3 tibia bilateral, nontender  Laboratory: Most recent CBC Lab Results   Component Value Date   WBC 11.0 (H) 12/23/2023   HGB 9.3 (L) 12/23/2023   HCT 29.2 (L) 12/23/2023   MCV 92.4 12/23/2023   PLT 267 12/23/2023   Most recent BMP    Latest Ref Rng & Units 12/23/2023    2:00 AM  BMP  Glucose 70 - 99 mg/dL 785   BUN 8 - 23 mg/dL 42   Creatinine 9.55 - 1.00 mg/dL 6.91   Sodium 864 - 854 mmol/L 136   Potassium 3.5 - 5.1 mmol/L 4.1   Chloride 98 - 111 mmol/L 100   CO2 22 - 32 mmol/L 26   Calcium  8.9 - 10.3 mg/dL 9.1     Other pertinent labs mag 1.9  Imaging/Diagnostic Tests: CTAP  IMPRESSION: 1. Stable 3.9 cm infrarenal abdominal aortic aneurysm with extensive aortoiliac atherosclerotic calcification, recommend ultrasound follow-up in 3 years per society for vascular surgery guidelines. 2. Atrophic kidneys with multiple exophytic cortical lesions, several of which are indeterminate on this single-phase exam, recommend dedicated renal mass protocol CT or MRI when feasible, if clinically indicated . 3. Large umbilical hernia containing the majority of the transverse colon. No obstruction. 4. Mild diverticulosis within a redundant sigmoid colon without superimposed inflammatory change 5. Mild cardiomegaly. 6. Moderate right coronary artery calcification. 7. Advanced degenerative changes of the right hip.  TEE pending    Abigail Pac, MD 12/23/2023, 7:32 AM  PGY-1, [facility] Family Medicine FPTS Intern pager: [phone], text pages welcome Secure chat group [facility] Teaching Service

## 2023-12-23 NOTE — Plan of Care (Addendum)
 MD at bedside to evaluate patient after TEE. Patient states she is doing well and has no current complaints. Patient stable on exam. Son, Renay Arenas at bedside. Had long discussion regarding his moms current state and her recent decline. Mr. Arenas agreed to discuss her care with our Palliative Care team. He is currently her only caretaker and she is at home with him. Will consult palliative care and inform PCP, Dr. Suzann Daring, who they would like to be involved in that conversation - consult palliative care  Che Rachal, MD 2:51 PM   Addendum: - discussed with patients PCP, Dr. Daring who would like to be involved in that conversation. Her availability includes Wednesday AM or late PM or Tuesday night.  - will discuss with palliative care team tomorrow 12/9 to coordinate a time

## 2023-12-23 NOTE — Assessment & Plan Note (Addendum)
 Dry weight 135 to 145 kg, 150.2 kg today.  Creatinine baseline appears to be around 2.8, trending down 3.08 from 3.42 yesterday. Will continue to diurese until AKI criteria.  Questionable oxygen  baseline of 2 L, saturating well overnight. - Redose lasix  40 mg IV twice today - strict I/Os, -2835, 3.075 cc of urine output - daily weights, 150.2 kg today, down from 153.4 kg - 2L [place] O2 sats >90% overnight - Continue prednisone  40 mg for 5 days (12/6-12/10) - Continue other meds, Breztri  and albuterol  q6h PRN - replete electrolytes to maintain Mg >2 and K >4 - CPAP at night, she refused last night

## 2023-12-23 NOTE — Progress Notes (Signed)
 Rt assessed patient, patient is not ready for cpap. Cpap is on standby

## 2023-12-23 NOTE — Transfer of Care (Signed)
 Immediate Anesthesia Transfer of Care Note  Patient: Abigail Wallace [facility]  Procedure(s) Performed: TRANSESOPHAGEAL ECHOCARDIOGRAM  Patient Location: Cath Lab  Anesthesia Type:General  Level of Consciousness: drowsy and patient cooperative  Airway & Oxygen  Therapy: Patient Spontanous Breathing and Patient connected to face mask oxygen   Post-op Assessment: Report given to RN, Post -op Vital signs reviewed and stable, Patient moving all extremities, and Patient moving all extremities X 4  Post vital signs: Reviewed and stable  Last Vitals:  Vitals Value Taken Time  BP 154/98 12/23/23  13:02  Temp    Pulse 75 12/23/23 13:02  Resp 20 12/23/23 13:02  SpO2 99 % 12/23/23 13:02  Vitals shown include unfiled device data.  Last Pain:  Vitals:   12/23/23 1215  TempSrc: Temporal  PainSc:       Patients Stated Pain Goal: 0 (12/22/23 2020)  Complications: There were no known notable events for this encounter.

## 2023-12-23 NOTE — Telephone Encounter (Signed)
 Patient's son calls nurse line requesting updated letter for work.   Letter needs to have his name on it. Also, letter dates are from 11/4-11/12.  Verified with son that dates are supposed to be from 12/4-12/12.  I have updated this on new letter and have pended for Dr. Orie to sign off on.   [name], RN

## 2023-12-23 NOTE — Telephone Encounter (Signed)
 Sent mychart letter per Dr. Orie.   Abigail JAYSON English, RN

## 2023-12-23 NOTE — Progress Notes (Signed)
 Heart Failure Navigator Progress Note  Assessed for Heart & Vascular TOC clinic readiness.  Patient does not meet criteria due to per MD history of Dementia. No HF TOC. .   Navigator will sign off at this time.   Stephane Haddock, BSN, Scientist, Clinical (histocompatibility And Immunogenetics) Only

## 2023-12-23 NOTE — CV Procedure (Addendum)
     PROCEDURE NOTE:  Procedure:  Transesophageal echocardiogram Operator:  Wilbert Bihari, MD Indications:  Strep Bacteremia Complications: None  During this procedure the patient is administered a total of Propofol  170 mg  to achieve and maintain moderate conscious sedation.  The patient's heart rate, blood pressure, and oxygen  saturation are monitored continuously during the procedure. The period of conscious sedation is 11 minutes, of which I was present face-to-face 100% of this time. Edsel Chang CRNA is an independent, trained observer who assisted in the monitoring of the patient's level of consciousness.    Results: Normal LV size and low normal function EF 50-55% Normal RV size and function Moderately RA Moderately LA with spontaneous echo contrast in the body of the LA.  Large LA appendage with no thrombus Normal TV with trivial TR Normal PV Normal MV with redundant chordae tendinae and trivial MR Normal trileaflet AV Normal interatrial septum with no evidence of shunt by colorflow dopper  Mild grade 2 atherosclerotic plaque in the thoracic and ascending aorta. No evidence of vegetation  The patient tolerated the procedure well and was transferred back to their room in stable condition.  Signed: Wilbert Bihari, MD [facility]

## 2023-12-23 NOTE — Progress Notes (Signed)
 [facility]  Date of Admission:  12/20/2023     Reason for Follow Up: Streptococcal bacteremia  Total days of antibiotics 4         ASSESSMENT:  Ms. Alec Mcphee is an 78 y/o caucasian female with pacemaker for complete heart block presenting to the hospital with increasing confusion, fevers, malaise and diarrhea and found to have Streptococcus mitis/oralis bacteremia of unclear source with imaging unremarkable. Started on Ceftriaxone .  Ms. [name] is planned for TEE evaluation today for endocarditis and to check pacemaker. Blood cultures from 12/22/23 are without growth to date and will continue to monitor for clearance of bacteremia. Discussed plan of care to continue with current dose of ceftriaxone  while awaiting TEE results. Electrophysiology consult pending. Continue standard/universal precautions. Remaining medical and supportive care per [facility] Medicine Team.   PLAN:  Continue current dose of ceftriaxone .  Monitor blood cultures for clearance of bacteremia. TEE to check for endocarditis and/or pacemaker infection.  Standard/universal precautions.  Remaining medical and supportive care per [facility] Medicine.   Principal Problem:   Streptococcal bacteremia Active Problems:   Chronic obstructive pulmonary disease (HCC)   Obstructive sleep apnea   Artificial cardiac pacemaker   CKD stage IIIb   Chronic health problem   Urinary tract infection associated with indwelling urethral catheter   Indwelling catheter present on admission   Altered mental status   UTI (urinary tract infection)   Acute on chronic diastolic heart failure (HCC)   Chronic kidney disease (CKD), stage 4 (HCC)   Dizziness   Hallucinations   Subacute bacterial endocarditis   Pacemaker infection   Chronic nausea    allopurinol   50 mg Oral q AM   amLODipine   10 mg Oral Daily   apixaban   2.5 mg Oral BID   budesonide -glycopyrrolate -formoterol   2 puff Inhalation BID   Chlorhexidine   Gluconate Cloth  6 each Topical Daily   divalproex   250 mg Oral Q12H   furosemide   40 mg Intravenous Q6H   metoprolol  tartrate  100 mg Oral TID   nystatin    Topical BID   OLANZapine   15 mg Oral QHS   pravastatin   40 mg Oral QHS   predniSONE   40 mg Oral Q breakfast   sodium bicarbonate   650 mg Oral TID    SUBJECTIVE:  Afebrile overnight with no acute events. Son at bedside providing most of history. Having some neck pain after her bath and her legs hurt (which is not new per son). Tolerating antibiotics with no adverse side effects.   Allergies  Allergen Reactions   Abilify [Aripiprazole] Anaphylaxis, Shortness Of Breath and Other (See Comments)    Tremors, also   Keflex  [Cephalexin ] Swelling and Other (See Comments)    Tongue swelling   Versacloz  [Clozapine ] Anaphylaxis   Ativan  [Lorazepam ] Other (See Comments)    Unknown reaction   Benadryl [Diphenhydramine Hcl] Other (See Comments)    Vision changes   Benztropine  Other (See Comments)    Mobility issues   Latuda [Lurasidone Hcl] Other (See Comments)    Tremors   Remeron [Mirtazapine] Other (See Comments)    Tremors   Haldol  [Haloperidol  Lactate] Other (See Comments)    Tardive dyskinesias  Tremors Somnolence Speech changes Sialorrhea    Codeine Other (See Comments)    Unknown reaction   Latex Rash     Review of Systems: Review of Systems  Constitutional:  Negative for chills, fever and weight loss.  Respiratory:  Negative for cough, shortness  of breath and wheezing.   Cardiovascular:  Negative for chest pain.  Gastrointestinal:  Negative for abdominal pain.  Musculoskeletal:  Positive for neck pain.      OBJECTIVE: Vitals:   12/23/23 0452 12/23/23 0454 12/23/23 0808 12/23/23 0809  BP:  (!) 134/53  (!) 151/68  Pulse:  60 71 71  Resp:  18 19 (!) 21  Temp:  98.1 F (36.7 C) 99.1 F (37.3 C)   TempSrc:  Oral Oral   SpO2:  94% 92% 95%  Weight: (!) 150.2 kg     Height:       Body mass index is 53.45  kg/m.  Physical Exam Constitutional:      General: She is not in acute distress.    Appearance: She is well-developed. She is obese.     Comments: Lying in bed with head of bed elevated and turned toward the left side.   Cardiovascular:     Rate and Rhythm: Normal rate and regular rhythm.     Heart sounds: Normal heart sounds.  Pulmonary:     Effort: Pulmonary effort is normal.     Breath sounds: Normal breath sounds.  Skin:    General: Skin is warm and dry.  Neurological:     Mental Status: She is alert and oriented to person, place, and time.     Lab Results Lab Results  Component Value Date   WBC 11.0 (H) 12/23/2023   HGB 9.3 (L) 12/23/2023   HCT 29.2 (L) 12/23/2023   MCV 92.4 12/23/2023   PLT 267 12/23/2023    Lab Results  Component Value Date   CREATININE 3.08 (H) 12/23/2023   BUN 42 (H) 12/23/2023   NA 136 12/23/2023   K 4.1 12/23/2023   CL 100 12/23/2023   CO2 26 12/23/2023    Lab Results  Component Value Date   ALT 12 12/20/2023   AST 14 (L) 12/20/2023   ALKPHOS 47 12/20/2023   BILITOT 0.3 12/20/2023     Microbiology: Recent Results (from the past 240 hours)  Blood culture (routine x 2)     Status: Abnormal   Collection Time: 12/20/23 12:58 AM   Specimen: BLOOD  Result Value Ref Range Status   Specimen Description BLOOD RIGHT ANTECUBITAL  Final   Special Requests   Final    BOTTLES DRAWN AEROBIC AND ANAEROBIC Blood Culture results may not be optimal due to an inadequate volume of blood received in culture bottles   Culture  Setup Time   Final    GRAM POSITIVE COCCI IN CHAINS IN BOTH AEROBIC AND ANAEROBIC BOTTLES CRITICAL RESULT CALLED TO, READ BACK BY AND VERIFIED WITH: PHARMD LYDIA CHEN ON 12/20/23 @ 1705 BY DRT    Culture (A)  Final    STREPTOCOCCUS MITIS/ORALIS SUSCEPTIBILITIES PERFORMED ON PREVIOUS CULTURE WITHIN THE LAST 5 DAYS. Performed at [facility] Lab, [address], [place], [place] [address]    Report Status 12/22/2023 FINAL   Final  Blood culture (routine x 2)     Status: Abnormal   Collection Time: 12/20/23  1:03 AM   Specimen: BLOOD RIGHT HAND  Result Value Ref Range Status   Specimen Description BLOOD RIGHT HAND  Final   Special Requests   Final    BOTTLES DRAWN AEROBIC AND ANAEROBIC Blood Culture adequate volume   Culture  Setup Time   Final    GRAM POSITIVE COCCI IN CHAINS IN BOTH AEROBIC AND ANAEROBIC BOTTLES CRITICAL RESULT CALLED TO, READ BACK BY  AND VERIFIED WITH: PHARMD LYDIA CHEN ON 12/20/23 @ 1705 BY DRT Performed at [facility] Lab, [address] [place], [place] [address]    Culture STREPTOCOCCUS MITIS/ORALIS (A)  Final   Report Status 12/22/2023 FINAL  Final   Organism ID, Bacteria STREPTOCOCCUS MITIS/ORALIS  Final      Susceptibility   Streptococcus mitis/oralis - MIC*    PENICILLIN  0.12 SENSITIVE Sensitive     CEFTRIAXONE  <=0.12 SENSITIVE Sensitive     LEVOFLOXACIN  1 SENSITIVE Sensitive     VANCOMYCIN 0.5 SENSITIVE Sensitive     * STREPTOCOCCUS MITIS/ORALIS  Blood Culture ID Panel (Reflexed)     Status: Abnormal   Collection Time: 12/20/23  1:03 AM  Result Value Ref Range Status   Enterococcus faecalis NOT DETECTED NOT DETECTED Final   Enterococcus Faecium NOT DETECTED NOT DETECTED Final   Listeria monocytogenes NOT DETECTED NOT DETECTED Final   Staphylococcus species NOT DETECTED NOT DETECTED Final   Staphylococcus aureus (BCID) NOT DETECTED NOT DETECTED Final   Staphylococcus epidermidis NOT DETECTED NOT DETECTED Final   Staphylococcus lugdunensis NOT DETECTED NOT DETECTED Final   Streptococcus species DETECTED (A) NOT DETECTED Final    Comment: Not Enterococcus species, Streptococcus agalactiae, Streptococcus pyogenes, or Streptococcus pneumoniae. CRITICAL RESULT CALLED TO, READ BACK BY AND VERIFIED WITH: PHARMD LYDIA CHEN ON 12/20/23 @ 1705 BY DRT    Streptococcus agalactiae NOT DETECTED NOT DETECTED Final   Streptococcus pneumoniae NOT DETECTED NOT DETECTED Final    Streptococcus pyogenes NOT DETECTED NOT DETECTED Final   A.calcoaceticus-baumannii NOT DETECTED NOT DETECTED Final   Bacteroides fragilis NOT DETECTED NOT DETECTED Final   Enterobacterales NOT DETECTED NOT DETECTED Final   Enterobacter cloacae complex NOT DETECTED NOT DETECTED Final   Escherichia coli NOT DETECTED NOT DETECTED Final   Klebsiella aerogenes NOT DETECTED NOT DETECTED Final   Klebsiella oxytoca NOT DETECTED NOT DETECTED Final   Klebsiella pneumoniae NOT DETECTED NOT DETECTED Final   Proteus species NOT DETECTED NOT DETECTED Final   Salmonella species NOT DETECTED NOT DETECTED Final   Serratia marcescens NOT DETECTED NOT DETECTED Final   Haemophilus influenzae NOT DETECTED NOT DETECTED Final   Neisseria meningitidis NOT DETECTED NOT DETECTED Final   Pseudomonas aeruginosa NOT DETECTED NOT DETECTED Final   Stenotrophomonas maltophilia NOT DETECTED NOT DETECTED Final   Candida albicans NOT DETECTED NOT DETECTED Final   Candida auris NOT DETECTED NOT DETECTED Final   Candida glabrata NOT DETECTED NOT DETECTED Final   Candida krusei NOT DETECTED NOT DETECTED Final   Candida parapsilosis NOT DETECTED NOT DETECTED Final   Candida tropicalis NOT DETECTED NOT DETECTED Final   Cryptococcus neoformans/gattii NOT DETECTED NOT DETECTED Final    Comment: Performed at [facility] Lab, [address] [place], [place] [address]  Remove and replace urinary cath (placed > 5 days) then obtain urine culture from new indwelling urinary catheter.     Status: Abnormal   Collection Time: 12/20/23  6:35 AM   Specimen: Urine, Catheterized  Result Value Ref Range Status   Specimen Description URINE, CATHETERIZED  Final   Special Requests   Final    NONE Performed at [facility], [address] [place], [place] [address]    Culture >=100,000 COLONIES/mL KLEBSIELLA PNEUMONIAE (A)  Final   Report Status 12/22/2023 FINAL  Final   Organism ID, Bacteria KLEBSIELLA PNEUMONIAE (A)  Final       Susceptibility   Klebsiella pneumoniae - MIC*    AMPICILLIN >=32 RESISTANT  Resistant     CEFAZOLIN  (URINE) Value in next row Sensitive      4 SENSITIVEThis is a modified FDA-approved test that has been validated and its performance characteristics determined by the reporting laboratory.  This laboratory is certified under the Clinical Laboratory Improvement Amendments CLIA as qualified to perform high complexity clinical laboratory testing.    CEFEPIME Value in next row Sensitive      4 SENSITIVEThis is a modified FDA-approved test that has been validated and its performance characteristics determined by the reporting laboratory.  This laboratory is certified under the Clinical Laboratory Improvement Amendments CLIA as qualified to perform high complexity clinical laboratory testing.    ERTAPENEM Value in next row Sensitive      4 SENSITIVEThis is a modified FDA-approved test that has been validated and its performance characteristics determined by the reporting laboratory.  This laboratory is certified under the Clinical Laboratory Improvement Amendments CLIA as qualified to perform high complexity clinical laboratory testing.    CEFTRIAXONE  Value in next row Sensitive      4 SENSITIVEThis is a modified FDA-approved test that has been validated and its performance characteristics determined by the reporting laboratory.  This laboratory is certified under the Clinical Laboratory Improvement Amendments CLIA as qualified to perform high complexity clinical laboratory testing.    CIPROFLOXACIN  Value in next row Sensitive      4 SENSITIVEThis is a modified FDA-approved test that has been validated and its performance characteristics determined by the reporting laboratory.  This laboratory is certified under the Clinical Laboratory Improvement Amendments CLIA as qualified to perform high complexity clinical laboratory testing.    GENTAMICIN  Value in next row Sensitive      4 SENSITIVEThis is a modified  FDA-approved test that has been validated and its performance characteristics determined by the reporting laboratory.  This laboratory is certified under the Clinical Laboratory Improvement Amendments CLIA as qualified to perform high complexity clinical laboratory testing.    NITROFURANTOIN Value in next row Sensitive      4 SENSITIVEThis is a modified FDA-approved test that has been validated and its performance characteristics determined by the reporting laboratory.  This laboratory is certified under the Clinical Laboratory Improvement Amendments CLIA as qualified to perform high complexity clinical laboratory testing.    TRIMETH /SULFA  Value in next row Sensitive      4 SENSITIVEThis is a modified FDA-approved test that has been validated and its performance characteristics determined by the reporting laboratory.  This laboratory is certified under the Clinical Laboratory Improvement Amendments CLIA as qualified to perform high complexity clinical laboratory testing.    AMPICILLIN/SULBACTAM Value in next row Intermediate      4 SENSITIVEThis is a modified FDA-approved test that has been validated and its performance characteristics determined by the reporting laboratory.  This laboratory is certified under the Clinical Laboratory Improvement Amendments CLIA as qualified to perform high complexity clinical laboratory testing.    PIP/TAZO Value in next row Sensitive      8 SENSITIVEThis is a modified FDA-approved test that has been validated and its performance characteristics determined by the reporting laboratory.  This laboratory is certified under the Clinical Laboratory Improvement Amendments CLIA as qualified to perform high complexity clinical laboratory testing.    MEROPENEM Value in next row Sensitive      8 SENSITIVEThis is a modified FDA-approved test that has been validated and its performance characteristics determined by the reporting laboratory.  This laboratory is certified under the  Clinical Laboratory Improvement Amendments  CLIA as qualified to perform high complexity clinical laboratory testing.    * >=100,000 COLONIES/mL KLEBSIELLA PNEUMONIAE  Culture, blood (Routine X 2) w Reflex to ID Panel     Status: None (Preliminary result)   Collection Time: 12/22/23 11:58 AM   Specimen: BLOOD RIGHT ARM  Result Value Ref Range Status   Specimen Description BLOOD RIGHT ARM  Final   Special Requests   Final    BOTTLES DRAWN AEROBIC ONLY Blood Culture results may not be optimal due to an inadequate volume of blood received in culture bottles   Culture   Final    NO GROWTH < 24 HOURS Performed at [facility] Lab, [address], [place], [place] [address]    Report Status PENDING  Incomplete  Culture, blood (Routine X 2) w Reflex to ID Panel     Status: None (Preliminary result)   Collection Time: 12/22/23 12:05 PM   Specimen: BLOOD RIGHT ARM  Result Value Ref Range Status   Specimen Description BLOOD RIGHT ARM  Final   Special Requests   Final    BOTTLES DRAWN AEROBIC ONLY Blood Culture results may not be optimal due to an inadequate volume of blood received in culture bottles   Culture   Final    NO GROWTH < 24 HOURS Performed at [facility], [address], [place], [place] [address]    Report Status PENDING  Incomplete    Cathlyn July, NP [facility]  12/23/2023  10:59 AM

## 2023-12-23 NOTE — Interval H&P Note (Signed)
 History and Physical Interval Note:  12/23/2023 12:24 PM  [facility]  has presented today for surgery, with the diagnosis of endocarditis.  The various methods of treatment have been discussed with the patient and family. After consideration of risks, benefits and other options for treatment, the patient has consented to  Procedure(s): TRANSESOPHAGEAL ECHOCARDIOGRAM (N/A) as a surgical intervention.  The patient's history has been reviewed, patient examined, no change in status, stable for surgery.  I have reviewed the patient's chart and labs.  Questions were answered to the patient's satisfaction.     Wilbert Bihari

## 2023-12-23 NOTE — Plan of Care (Signed)
   Problem: Education: Goal: Knowledge of General Education information will improve Description Including pain rating scale, medication(s)/side effects and non-pharmacologic comfort measures Outcome: Progressing

## 2023-12-23 NOTE — TOC Progression Note (Signed)
 Transition of Care [facility]) - Progression Note    Patient Details  Name: Abigail Wallace MRN: [id number] Date of Birth: [birth year]  Transition of Care [facility]) CM/SW Contact  Waddell Barnie Rama, RN Phone Number: 12/23/2023, 1:39 PM  Clinical Narrative:    For TEE today for endocarditis rule out. Conts on iv lasix  and 2 liters oxygen .    Expected Discharge Plan: Home w Home Health Services Barriers to Discharge: Continued Medical Work up               Expected Discharge Plan and Services   Discharge Planning Services: CM Consult Post Acute Care Choice: NA Living arrangements for the past 2 months: Single Family Home Expected Discharge Date: 12/22/23               DME Arranged: N/A DME Agency: NA       HH Arranged: NA           Social Drivers of Health (SDOH) Interventions SDOH Screenings   Food Insecurity: No Food Insecurity (12/20/2023)  Housing: Low Risk  (12/20/2023)  Transportation Needs: No Transportation Needs (12/20/2023)  Utilities: Not At Risk (12/20/2023)  Alcohol  Screen: Low Risk  (12/27/2020)  Depression (PHQ2-9): High Risk (09/30/2023)  Financial Resource Strain: Low Risk  (03/23/2022)  Physical Activity: Inactive (12/27/2020)  Social Connections: Socially Isolated (12/20/2023)  Stress: No Stress Concern Present (12/27/2020)  Tobacco Use: Medium Risk (12/20/2023)    Readmission Risk Interventions    12/20/2023    2:55 PM 11/26/2023   11:22 AM 06/19/2023    9:04 AM  Readmission Risk Prevention Plan  Transportation Screening Complete Complete Complete  PCP or Specialist Appt within 5-7 Days   Complete  PCP or Specialist Appt within 3-5 Days  Complete   Home Care Screening   Complete  Medication Review (RN CM)   Referral to Pharmacy  HRI or Home Care Consult  Complete   Social Work Consult for Recovery Care Planning/Counseling  Complete   Palliative Care Screening  Not Applicable   Medication Review Oceanographer) Complete Referral to  Pharmacy   PCP or Specialist appointment within 3-5 days of discharge Complete    HRI or Home Care Consult Complete    Palliative Care Screening Not Applicable

## 2023-12-24 ENCOUNTER — Encounter (HOSPITAL_COMMUNITY): Payer: Self-pay | Admitting: Cardiology

## 2023-12-24 DIAGNOSIS — I330 Acute and subacute infective endocarditis: Secondary | ICD-10-CM

## 2023-12-24 DIAGNOSIS — R197 Diarrhea, unspecified: Secondary | ICD-10-CM | POA: Insufficient documentation

## 2023-12-24 LAB — CBC
HCT: 29.1 % — ABNORMAL LOW (ref 36.0–46.0)
Hemoglobin: 9.1 g/dL — ABNORMAL LOW (ref 12.0–15.0)
MCH: 29.4 pg (ref 26.0–34.0)
MCHC: 31.3 g/dL (ref 30.0–36.0)
MCV: 94.2 fL (ref 80.0–100.0)
Platelets: 265 K/uL (ref 150–400)
RBC: 3.09 MIL/uL — ABNORMAL LOW (ref 3.87–5.11)
RDW: 14.3 % (ref 11.5–15.5)
WBC: 15.8 K/uL — ABNORMAL HIGH (ref 4.0–10.5)
nRBC: 0 % (ref 0.0–0.2)

## 2023-12-24 LAB — BASIC METABOLIC PANEL WITH GFR
Anion gap: 12 (ref 5–15)
BUN: 42 mg/dL — ABNORMAL HIGH (ref 8–23)
CO2: 24 mmol/L (ref 22–32)
Calcium: 8.9 mg/dL (ref 8.9–10.3)
Chloride: 102 mmol/L (ref 98–111)
Creatinine, Ser: 2.91 mg/dL — ABNORMAL HIGH (ref 0.44–1.00)
GFR, Estimated: 16 mL/min — ABNORMAL LOW (ref >60)
Glucose, Bld: 203 mg/dL — ABNORMAL HIGH (ref 70–99)
Potassium: 3.5 mmol/L (ref 3.5–5.1)
Sodium: 138 mmol/L (ref 135–145)

## 2023-12-24 LAB — MAGNESIUM: Magnesium: 2 mg/dL (ref 1.7–2.4)

## 2023-12-24 MED ORDER — POTASSIUM CHLORIDE CRYS ER 20 MEQ PO TBCR
40.0000 meq | EXTENDED_RELEASE_TABLET | Freq: Once | ORAL | Status: AC
  Administered 2023-12-24: 40 meq via ORAL
  Filled 2023-12-24: qty 2

## 2023-12-24 MED ORDER — PENICILLIN G POTASSIUM 20000000 UNITS IJ SOLR
12.0000 10*6.[IU] | INTRAVENOUS | Status: DC
  Administered 2023-12-24 – 2023-12-27 (×4): 12 10*6.[IU] via INTRAVENOUS
  Filled 2023-12-24 (×5): qty 12

## 2023-12-24 NOTE — Assessment & Plan Note (Addendum)
 VSS, resumed metoprolol  yesterday.  Leukocytosis continues trending up to 15.8 this morning, will continue to monitor. - DC ceftriaxone , start Penicillin  G 12 million units daily - ID consulted, appreciate all their recs  - 4 to 6 weeks of IV antibiotics, probable to receive chronic suppressive antibiotics - Consult cardiology, TEE found no vegetation - EP also following for evaluation of pacemaker, likely not surgical candidate, pending MD attestation - Palliative consult pending today, son would like to coordinate with PCP Dr. Delores pending Wednesday - Repeat blood cultures 12/7, NGTD 2 days - AM CBC, BMP, mag

## 2023-12-24 NOTE — Assessment & Plan Note (Addendum)
 Dry weight 135 to 145 kg, 145.5 kg today.  Creatinine baseline appears to be around 2.8, continues to improve 2.91 today, will continue to diurese.  - Redose lasix  40 mg IV twice today - strict I/Os, -2910, urine output 3350 yesterday - daily weights, 145.5 kg today, down from 150.2 kg - 2L [place] O2 sats >90% overnight - DC prednisone  40 mg for 5 days, fell off from being NPO - Continue other meds, Breztri  and albuterol  q6h PRN - replete electrolytes to maintain Mg >2 and K >4, less than 3.5 repleted with 40 mill equivalents - CPAP nightly

## 2023-12-24 NOTE — Consult Note (Signed)
 Palliative Care Consult Note                                  Date: 12/24/2023   Patient Name: Abigail Wallace [facility]  DOB:07/22/1945  FMW:[id number]  Age / Sex:78 y.o., female  PCP: Delores Suzann HERO, MD Referring Physician: Orie Milda CROME, MD  Reason for Consultation: Establishing goals of care  Past Medical History:  Diagnosis Date   Abdominal wall hernia 01/2017   Advanced care planning/counseling discussion 09/29/2021   Atrial fibrillation (HCC)    CHF (congestive heart failure) (HCC)    CKD (chronic kidney disease)    COPD (chronic obstructive pulmonary disease) (HCC)    Coronary artery disease    NON-CRITICAL   Ectasis aorta    GERD (gastroesophageal reflux disease)    Gout    History of Sundowning 06/15/2023   Hypertension    Memory difficulties 12/02/2013   Pacemaker    Paranoid schizophrenia (HCC)    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         Tardive dyskinesia 04/26/2011   Type 2 diabetes mellitus [facility])      Assessment & Plan:   HPI/Patient Profile: 78 y.o. female  with past medical history of CKD IV, complete heart block with pacemaker, atrial fibrillation on Eliquis , schizophrenia admitted on 12/20/2023 with streptococcus mitis oralis bacteremia.   Per Infectious disease progress note on 12/22/2023 by Fleeta Rothman MD - patient positive for streptococcus mitis oralis bacteremia with sepsis. Concerns for endocarditis with possible vegetation. Per electrophysiology note on 12/23/2023 by Aniceto NP - TEE negative for vegetation. Repeat cultures showed NGTD as of 12/24/2023 after initial culture on 12/20/2023 positive for strep mitis oralis. Concerns for pacemaker as source of infection, patient is not an extraction candidate given comorbid conditions.   Palliative medicine consulted for goals of care conversation.   SUMMARY OF RECOMMENDATIONS    Full code, full scope after extensive conversation with sons and patient Continue to follow for support with continued goals of care conversation  Symptom Management:  Per primary team  Code Status: Full Code  Prognosis:  Unable to determine  Discharge Planning:  To Be Determined   Discussed with: Baloch MD and Delores MD about the patient's wishes to continue full code and full scope interventions.   Subjective:   Reviewed medical records, received report from team, assessed the patient and then meet at the patient's bedside to discuss diagnosis, prognosis, GOC, EOL wishes disposition and options.  I met with patient, Charlie (son), and [name] (son) at the bedside.    We meet to discuss diagnosis prognosis, GOC, EOL wishes, disposition and options. Concept of Palliative Care was introduced as specialized medical care for people and their families living with serious illness.  If focuses on providing relief from the symptoms and stress of a serious illness.  The goal is to improve quality of life for both the patient and the family. Values and goals of care important to patient and family were attempted to be elicited.  Created space and opportunity for patient  and family to explore thoughts and feelings regarding current medical situation   Natural trajectory and current clinical status were discussed. Questions and concerns addressed. Patient encouraged to call with questions or concerns.    Patient/Family Understanding of Illness: - Patient has poor understanding of the  seriousness of her illness, shared that she believes she has a virus that resulted in her feeling unwell - Sons have a better understanding that the patient is not doing well at home with recurrent hospitalizations in the last 6 months including a bacteremia that will require indefinite IV antibiotics  Life Review: - Sons shared that the patient became a ward of the state about 53 years ago presumably from her  schizophrenia - She worked 2 jobs to be able to support her 3 children  Baseline Status: - Patient is bedbound for the last several year with morbid obesity - Patient is total care dependent on son Fairy) along with 11 hour private care giver support through CAPS program  Today's Discussion: - Discussed with patient and sons about current hospitalization and future steps to help direct primary team - Patient has poor understanding of her overall health, sons have a better understanding that the patient will most likely have recurrent hospitalization - Patient clearly stated her goals were to continue all aggressive medical interventions to prolong her life and shares that her current quality of life is acceptable to her - Discussed with patient scenarios of repeated hospitalizations that would prevent her from spending time at home which was a major goal of hers and patient stated that she will talk about it if we get there - Also discussed scenarios of long term mechanical ventilation, artificial feeding tubes, as well as dialysis and patient reiterated that is also her wishes and that is acceptable, but did become frustrated with continued discussion of quality of life and what limits there may be to interventions that she would not want - Sons shared with patient that they do not think that becoming ventilator dependent with multiple life prolonging measures in a vegetative state would not be an acceptable quality of life, but understands the patient's wishes and will do their best to honor her wishes although it may be against their own personal desires - Patient was resistant and was unequivocal to hearing the concerns from the medical team as well as her sons and the burden she is placing on her sons as they have their own health problems to deal with - One son shares that he has his kidney disease is close to the point of needing dialysis and a transplant, patient shares that the son needs to  get a second opinion on that since she does not think he needs dialysis or transplant reinforcing that the patient has a poor understanding as well as mistrust of medical practitioners - Sons also shared that the patient was a ward of the state about 53 years ago due to psychiatric instability and it appears she remains a ward of the state despite multiple attempts from son to gain legal guardianship of the patient as the patient's advocate never showed up for legal hearings - Sons requested that palliative to continue to visit for further discussions with patient and family - Discussed code status and patient will remain full code and open to all available medical interventions to prolong life  Goals: - To get better and go home  Review of Systems  Gastrointestinal:  Positive for abdominal pain and nausea.    Objective:   Primary Diagnoses: Present on Admission:  Altered mental status  Acute on chronic diastolic heart failure (HCC)  CKD stage IIIb  Obstructive sleep apnea  Chronic obstructive pulmonary disease (HCC)  Urinary tract infection associated with indwelling urethral catheter  Artificial cardiac pacemaker  Streptococcal bacteremia  Hypertension, isolated systolic   Vital Signs:  BP (!) 161/100 (BP Location: Right Wrist)   Pulse 70   Temp 97.7 F (36.5 C) (Oral)   Resp 15   Ht 5' 6 (1.676 m)   Wt (!) 145.5 kg   SpO2 97%   BMI 51.77 kg/m   Physical Exam Constitutional:      Appearance: She is obese. She is ill-appearing.  HENT:     Head: Normocephalic.     Nose: Nose normal.     Mouth/Throat:     Mouth: Mucous membranes are dry.  Eyes:     Extraocular Movements: Extraocular movements intact.  Cardiovascular:     Rate and Rhythm: Normal rate.  Pulmonary:     Effort: Pulmonary effort is normal.     Comments: On nasal cannula Neurological:     General: No focal deficit present.     Mental Status: She is alert.  Psychiatric:     Comments: Poor insight  on health    Palliative Assessment/Data: 30%    Thank you for allowing us  to participate in the care of [facility] PMT will continue to support holistically.  I personally spent a total of 75 minutes in the care of the patient today including preparing to see the patient, getting/reviewing separately obtained history, performing a medically appropriate exam/evaluation, counseling and educating, referring and communicating with other health care professionals, and documenting clinical information in the EHR.  Signed by: Fairy FORBES Shan DEVONNA Palliative Medicine Team  Team Phone # [phone] (Nights/Weekends)  12/24/2023, 1:44 PM

## 2023-12-24 NOTE — Progress Notes (Signed)
 Pharmacy: Antimicrobial Stewardship Note  47 YOF with strep mitis/oralis bacteremia in setting of ICD. Despite TEE negative, plan to treat with 6 weeks of antibiotics and suppress due to not being a candidate for ICD extraction.   Will narrow to IV penicillin  today based on organism susceptibilities - to provide more targeted narrow coverage and monitor the patient with the change.  Blood culture (routine x 2) [[id number]] Collected: 12/20/23 0103  Order Status: Completed Specimen: BLOOD RIGHT HAND Updated: 12/22/23 1321   Specimen Description BLOOD RIGHT HAND   Special Requests BOTTLES DRAWN AEROBIC AND ANAEROBIC Blood Culture adequate volume   Culture  Setup Time --   GRAM POSITIVE COCCI IN CHAINS IN BOTH AEROBIC AND ANAEROBIC BOTTLES   Culture STREPTOCOCCUS MITIS/ORALIS   Report Status 12/22/2023 FINAL   Organism ID, Bacteria STREPTOCOCCUS MITIS/ORALIS  Susceptibility  Streptococcus mitis/oralis ([id number])  Antibiotic Interpretation Microscan Method Status   PENICILLIN  Sensitive 0.12 SENSITIVE MIC Final   CEFTRIAXONE  Sensitive <=0.12 SENSITIVE MIC Final   LEVOFLOXACIN  Sensitive 1 SENSITIVE MIC Final   VANCOMYCIN Sensitive 0.5 SENSITIVE MIC Final   Susceptibility Comments       Plan - Narrow to IV penicillin  12 million units daily as a continuous infusion (reduced for renal function) - Will follow-up on repeat blood cultures 12/7 to ensure clear prior to finalizing OPAT orders  Thank you for allowing pharmacy to be a part of this patient's care.  Almarie Lunger, PharmD, BCPS, BCIDP Infectious Diseases Clinical Pharmacist 12/24/2023 10:55 AM   **Pharmacist phone directory can now be found on [email or web address] ([email or web address]).  Listed under [facility].

## 2023-12-24 NOTE — Progress Notes (Signed)
 Daily Progress Note Intern Pager: [phone]  Patient name: Abigail Wallace [facility] Medical record number: [id number] Date of birth: Oct 07, 1945 Age: 78 y.o. Gender: female  Primary Care Provider: Delores Suzann HERO, MD Consultants: Cardiology Code Status: Full code   Pt Overview and Major Events to Date:  12/5-admitted 12/8-TEE, no vegetations   Medical Decision Making:   This is an 78 year old female with PMH of CKD, pAfib, schizophrenia, HTN, OSA, COPD who is admitted for AMS, UTI bacteremia, exacerbation CHF exacerbation and acute COPD exacerbation. Stable today, no acute events overnight Assessment & Plan Streptococcal bacteremia Altered mental status UTI (urinary tract infection) CKD stage IIIb Indwelling catheter present on admission Pacemaker infection VSS, resumed metoprolol  yesterday.  Leukocytosis continues trending up to 15.8 this morning, will continue to monitor. - DC ceftriaxone , start Penicillin  G 12 million units daily - ID consulted, appreciate all their recs  - 4 to 6 weeks of IV antibiotics, probable to receive chronic suppressive antibiotics - Consult cardiology, TEE found no vegetation - EP also following for evaluation of pacemaker, likely not surgical candidate, pending MD attestation - Palliative consult pending today, son would like to coordinate with PCP Dr. Delores pending Wednesday - Repeat blood cultures 12/7, NGTD 2 days - AM CBC, BMP, mag Acute on chronic diastolic heart failure (HCC) Chronic obstructive pulmonary disease (HCC) Obstructive sleep apnea Dry weight 135 to 145 kg, 145.5 kg today.  Creatinine baseline appears to be around 2.8, continues to improve 2.91 today, will continue to diurese.  - Redose lasix  40 mg IV twice today - strict I/Os, -2910, urine output 3350 yesterday - daily weights, 145.5 kg today, down from 150.2 kg - 2L [place] O2 sats >90% overnight - DC prednisone  40 mg for 5 days, fell off from being NPO - Continue other meds,  Breztri  and albuterol  q6h PRN - replete electrolytes to maintain Mg >2 and K >4, less than 3.5 repleted with 40 mill equivalents - CPAP nightly Hypertension, isolated systolic 180s over 50s, sustained over the last 24 hours -Current home regimen amlodipine  10 mg and metoprolol  100 mg 3 times daily -Consider switching amlodipine  to chlorthalidone 12.5 mg outpatient, previously tried and DC'd 01/27/2018 Frequent loose stools 4-6 bowel movements/day for the last ~2 days. Possible diet related vs 2/2 abx. Benign abdominal exam today, persistent umbilical hernia of transverse colon seen on recent CTAP - Loperamide  2mg  q4h PRN - Fiber is currently ordered - Encouraged yogurt and probiotics Chronic health problem CKDIV - continue home sodium bicarb, avoid nephrotoxic agents.  pAF -  continue home eliquis , metoprolol , atrial paced on telemetry Schizophrenia - continue home olanzapine , depakote  Urinary retention - secondary to polio, chronic Foley Gout - continue home allopurinol   FEN/GI: DC renal/carb modified and start regular diet PPx: Continue home Eliquis  Dispo: Home with home health, pending diuresis and ABX plan  Subjective:  Isolated systolic hypertension overnight to 180s, dipping to 129 overnight over 50s to low 60s, would dip to 46. Saw patient at bedside and overall is doing well. Is bothered by frequent bowel movements, loosely formed, ~4 BM over last 24 hours. Some SOB in early evening, improved with 2L O2. No CP, palpitations, no diaphoresis.  Objective: Temp:  [98.1 F (36.7 C)-99.1 F (37.3 C)] 98.6 F (37 C) (12/09 0418) Pulse Rate:  [60-81] 61 (12/09 0418) Resp:  [15-25] 20 (12/09 0418) BP: (129-196)/(45-101) 171/62 (12/09 0418) SpO2:  [92 %-99 %] 95 % (12/09 0418) Weight:  [145.5 kg] 145.5 kg (  12/09 0418) Physical Exam: General: Obese, frail appearing, lying left lateral decubitus  Cardiovascular: Distant heart sounds, RRR, no M/R/G's, 2/4 pulses bilaterally, cap  refill <2 secs. Respiratory: Trace inspiratory crackles LLL, no other wheezes or crackles. Abdomen: Obese abdomen with large umbilical hernia, active bowel sounds, non tender to light or deep palpation, no rebound or guarding. Extremities: 2+ pitting edema 1/2 tibia RLE and 1+ ankle LLE  Laboratory: Most recent CBC Lab Results  Component Value Date   WBC 15.8 (H) 12/24/2023   HGB 9.1 (L) 12/24/2023   HCT 29.1 (L) 12/24/2023   MCV 94.2 12/24/2023   PLT 265 12/24/2023   Most recent BMP    Latest Ref Rng & Units 12/24/2023    2:27 AM  BMP  Glucose 70 - 99 mg/dL 796   BUN 8 - 23 mg/dL 42   Creatinine 9.55 - 1.00 mg/dL 7.08   Sodium 864 - 854 mmol/L 138   Potassium 3.5 - 5.1 mmol/L 3.5   Chloride 98 - 111 mmol/L 102   CO2 22 - 32 mmol/L 24   Calcium  8.9 - 10.3 mg/dL 8.9     Other pertinent labs magnesium  2.0  Imaging/Diagnostic Tests: TEE: No vegetation seen, EF 50-55%, no regional wall abnormalities  Lorrane Pac, MD 12/24/2023, 7:15 AM  PGY-1, [facility] FPTS Intern pager: [phone], text pages welcome Secure chat group [facility] Teaching Service

## 2023-12-24 NOTE — Assessment & Plan Note (Signed)
 CKDIV - continue home sodium bicarb, avoid nephrotoxic agents.  pAF -  continue home eliquis , metoprolol , atrial paced on telemetry Schizophrenia - continue home olanzapine , depakote  Urinary retention - secondary to polio, chronic Foley Gout - continue home allopurinol 

## 2023-12-24 NOTE — Progress Notes (Signed)
 [facility]  Date of Admission:  12/20/2023     Reason for Follow Up: Streptococcal bacteremia  Total days of antibiotics 5         ASSESSMENT:  Abigail Wallace is an 78 y/o caucasian female with pacemaker for complete heart block presenting to the hospital with increasing confusion, fevers, malaise and diarrhea and found to have Streptococcus mitis/oralis bacteremia of unclear source with imaging unremarkable. Started on Ceftriaxone .   Ms. [name]'s blood cultures remain without growth to date. TEE did not show any distinct vegetation on heart valves or leads but there was a thickened area on one lead. Tolerating antibiotics with no adverse side effects. Discussed plan of care to transition to Penicillin  G with plan for 6 weeks of treatment followed by suppression as she is not a candidate for device extraction per EP. Will await clearance of blood cultures prior to placement of PICC line. Remaining medical and supportive care per Internal Medicine.   PLAN:  Change antibiotics to Penicillin  G with anticipation of continuous infusion as outpatient.  Monitor cultures for clearance of bacteremia Hold PICC placement pending blood culture clearance.  Continue standard/universal precautions.  Remaining medical and supportive care per Internal Medicine.  Principal Problem:   Streptococcal bacteremia Active Problems:   Chronic obstructive pulmonary disease (HCC)   Obstructive sleep apnea   Artificial cardiac pacemaker   Hypertension, isolated systolic   CKD stage IIIb   Chronic health problem   Urinary tract infection associated with indwelling urethral catheter   Indwelling catheter present on admission   Altered mental status   UTI (urinary tract infection)   Acute on chronic diastolic heart failure (HCC)   Chronic kidney disease (CKD), stage 4 (HCC)   Dizziness   Hallucinations   Subacute bacterial endocarditis   Pacemaker infection   Chronic nausea    Frequent loose stools    allopurinol   50 mg Oral q AM   amLODipine   10 mg Oral Daily   apixaban   2.5 mg Oral BID   budesonide -glycopyrrolate -formoterol   2 puff Inhalation BID   Chlorhexidine  Gluconate Cloth  6 each Topical Daily   divalproex   250 mg Oral Q12H   metoprolol  tartrate  100 mg Oral TID   nystatin    Topical BID   OLANZapine   15 mg Oral QHS   polycarbophil  625 mg Oral Daily   pravastatin   40 mg Oral QHS   predniSONE   40 mg Oral Q breakfast   sodium bicarbonate   650 mg Oral TID    SUBJECTIVE:  Afebrile overnight with no acute events. Tolerating antibiotics with no adverse side effects. Denies fevers or chills. Son visiting at bedside.   Allergies  Allergen Reactions   Abilify [Aripiprazole] Anaphylaxis, Shortness Of Breath and Other (See Comments)    Tremors, also   Keflex  [Cephalexin ] Swelling and Other (See Comments)    Tongue swelling   Versacloz  [Clozapine ] Anaphylaxis   Ativan  [Lorazepam ] Other (See Comments)    Unknown reaction   Benadryl [Diphenhydramine Hcl] Other (See Comments)    Vision changes   Benztropine  Other (See Comments)    Mobility issues   Latuda [Lurasidone Hcl] Other (See Comments)    Tremors   Remeron [Mirtazapine] Other (See Comments)    Tremors   Haldol  [Haloperidol  Lactate] Other (See Comments)    Tardive dyskinesias  Tremors Somnolence Speech changes Sialorrhea    Codeine Other (See Comments)    Unknown reaction   Latex Rash  Review of Systems: Review of Systems  Constitutional:  Negative for chills, fever and weight loss.  Respiratory:  Negative for cough, shortness of breath and wheezing.   Cardiovascular:  Negative for chest pain and leg swelling.  Gastrointestinal:  Negative for abdominal pain, constipation, diarrhea, nausea and vomiting.  Skin:  Negative for rash.      OBJECTIVE: Vitals:   12/24/23 0033 12/24/23 0418 12/24/23 0740 12/24/23 0755  BP: (!) 129/46 (!) 171/62  136/73  Pulse: 60 61 68 70  Resp:  20 20 16 17   Temp: 98.1 F (36.7 C) 98.6 F (37 C)    TempSrc: Oral Oral    SpO2: 95% 95% 98% 95%  Weight:  (!) 145.5 kg    Height:       Body mass index is 51.77 kg/m.  Physical Exam Constitutional:      General: She is not in acute distress.    Appearance: She is well-developed. She is obese.     Comments: Lying in bed on left side; pleasant.   Cardiovascular:     Rate and Rhythm: Normal rate and regular rhythm.     Heart sounds: Normal heart sounds.  Pulmonary:     Effort: Pulmonary effort is normal.     Breath sounds: Normal breath sounds.  Skin:    General: Skin is warm and dry.  Neurological:     Mental Status: She is alert.     Lab Results Lab Results  Component Value Date   WBC 15.8 (H) 12/24/2023   HGB 9.1 (L) 12/24/2023   HCT 29.1 (L) 12/24/2023   MCV 94.2 12/24/2023   PLT 265 12/24/2023    Lab Results  Component Value Date   CREATININE 2.91 (H) 12/24/2023   BUN 42 (H) 12/24/2023   NA 138 12/24/2023   K 3.5 12/24/2023   CL 102 12/24/2023   CO2 24 12/24/2023    Lab Results  Component Value Date   ALT 12 12/20/2023   AST 14 (L) 12/20/2023   ALKPHOS 47 12/20/2023   BILITOT 0.3 12/20/2023     Microbiology: Recent Results (from the past 240 hours)  Blood culture (routine x 2)     Status: Abnormal   Collection Time: 12/20/23 12:58 AM   Specimen: BLOOD  Result Value Ref Range Status   Specimen Description BLOOD RIGHT ANTECUBITAL  Final   Special Requests   Final    BOTTLES DRAWN AEROBIC AND ANAEROBIC Blood Culture results may not be optimal due to an inadequate volume of blood received in culture bottles   Culture  Setup Time   Final    GRAM POSITIVE COCCI IN CHAINS IN BOTH AEROBIC AND ANAEROBIC BOTTLES CRITICAL RESULT CALLED TO, READ BACK BY AND VERIFIED WITH: PHARMD LYDIA CHEN ON 12/20/23 @ 1705 BY DRT    Culture (A)  Final    STREPTOCOCCUS MITIS/ORALIS SUSCEPTIBILITIES PERFORMED ON PREVIOUS CULTURE WITHIN THE LAST 5 DAYS. Performed at  [facility], [address], [place], [place] [address]    Report Status 12/22/2023 FINAL  Final  Blood culture (routine x 2)     Status: Abnormal   Collection Time: 12/20/23  1:03 AM   Specimen: BLOOD RIGHT HAND  Result Value Ref Range Status   Specimen Description BLOOD RIGHT HAND  Final   Special Requests   Final    BOTTLES DRAWN AEROBIC AND ANAEROBIC Blood Culture adequate volume   Culture  Setup Time   Final    GRAM POSITIVE COCCI  IN CHAINS IN BOTH AEROBIC AND ANAEROBIC BOTTLES CRITICAL RESULT CALLED TO, READ BACK BY AND VERIFIED WITH: PHARMD LYDIA CHEN ON 12/20/23 @ 1705 BY DRT Performed at [facility], [address], [place], [place] [address]    Culture STREPTOCOCCUS MITIS/ORALIS (A)  Final   Report Status 12/22/2023 FINAL  Final   Organism ID, Bacteria STREPTOCOCCUS MITIS/ORALIS  Final      Susceptibility   Streptococcus mitis/oralis - MIC*    PENICILLIN  0.12 SENSITIVE Sensitive     CEFTRIAXONE  <=0.12 SENSITIVE Sensitive     LEVOFLOXACIN  1 SENSITIVE Sensitive     VANCOMYCIN 0.5 SENSITIVE Sensitive     * STREPTOCOCCUS MITIS/ORALIS  Blood Culture ID Panel (Reflexed)     Status: Abnormal   Collection Time: 12/20/23  1:03 AM  Result Value Ref Range Status   Enterococcus faecalis NOT DETECTED NOT DETECTED Final   Enterococcus Faecium NOT DETECTED NOT DETECTED Final   Listeria monocytogenes NOT DETECTED NOT DETECTED Final   Staphylococcus species NOT DETECTED NOT DETECTED Final   Staphylococcus aureus (BCID) NOT DETECTED NOT DETECTED Final   Staphylococcus epidermidis NOT DETECTED NOT DETECTED Final   Staphylococcus lugdunensis NOT DETECTED NOT DETECTED Final   Streptococcus species DETECTED (A) NOT DETECTED Final    Comment: Not Enterococcus species, Streptococcus agalactiae, Streptococcus pyogenes, or Streptococcus pneumoniae. CRITICAL RESULT CALLED TO, READ BACK BY AND VERIFIED WITH: PHARMD LYDIA CHEN ON 12/20/23 @ 1705 BY DRT    Streptococcus agalactiae  NOT DETECTED NOT DETECTED Final   Streptococcus pneumoniae NOT DETECTED NOT DETECTED Final   Streptococcus pyogenes NOT DETECTED NOT DETECTED Final   A.calcoaceticus-baumannii NOT DETECTED NOT DETECTED Final   Bacteroides fragilis NOT DETECTED NOT DETECTED Final   Enterobacterales NOT DETECTED NOT DETECTED Final   Enterobacter cloacae complex NOT DETECTED NOT DETECTED Final   Escherichia coli NOT DETECTED NOT DETECTED Final   Klebsiella aerogenes NOT DETECTED NOT DETECTED Final   Klebsiella oxytoca NOT DETECTED NOT DETECTED Final   Klebsiella pneumoniae NOT DETECTED NOT DETECTED Final   Proteus species NOT DETECTED NOT DETECTED Final   Salmonella species NOT DETECTED NOT DETECTED Final   Serratia marcescens NOT DETECTED NOT DETECTED Final   Haemophilus influenzae NOT DETECTED NOT DETECTED Final   Neisseria meningitidis NOT DETECTED NOT DETECTED Final   Pseudomonas aeruginosa NOT DETECTED NOT DETECTED Final   Stenotrophomonas maltophilia NOT DETECTED NOT DETECTED Final   Candida albicans NOT DETECTED NOT DETECTED Final   Candida auris NOT DETECTED NOT DETECTED Final   Candida glabrata NOT DETECTED NOT DETECTED Final   Candida krusei NOT DETECTED NOT DETECTED Final   Candida parapsilosis NOT DETECTED NOT DETECTED Final   Candida tropicalis NOT DETECTED NOT DETECTED Final   Cryptococcus neoformans/gattii NOT DETECTED NOT DETECTED Final    Comment: Performed at [facility], [address] [place], [place] [address]  Remove and replace urinary cath (placed > 5 days) then obtain urine culture from new indwelling urinary catheter.     Status: Abnormal   Collection Time: 12/20/23  6:35 AM   Specimen: Urine, Catheterized  Result Value Ref Range Status   Specimen Description URINE, CATHETERIZED  Final   Special Requests   Final    NONE Performed at [facility], [address], [place], [place] [address]    Culture >=100,000 COLONIES/mL KLEBSIELLA PNEUMONIAE (A)  Final    Report Status 12/22/2023 FINAL  Final   Organism ID, Bacteria KLEBSIELLA PNEUMONIAE (A)  Final  Susceptibility   Klebsiella pneumoniae - MIC*    AMPICILLIN >=32 RESISTANT Resistant     CEFAZOLIN  (URINE) Value in next row Sensitive      4 SENSITIVEThis is a modified FDA-approved test that has been validated and its performance characteristics determined by the reporting laboratory.  This laboratory is certified under the Clinical Laboratory Improvement Amendments CLIA as qualified to perform high complexity clinical laboratory testing.    CEFEPIME Value in next row Sensitive      4 SENSITIVEThis is a modified FDA-approved test that has been validated and its performance characteristics determined by the reporting laboratory.  This laboratory is certified under the Clinical Laboratory Improvement Amendments CLIA as qualified to perform high complexity clinical laboratory testing.    ERTAPENEM Value in next row Sensitive      4 SENSITIVEThis is a modified FDA-approved test that has been validated and its performance characteristics determined by the reporting laboratory.  This laboratory is certified under the Clinical Laboratory Improvement Amendments CLIA as qualified to perform high complexity clinical laboratory testing.    CEFTRIAXONE  Value in next row Sensitive      4 SENSITIVEThis is a modified FDA-approved test that has been validated and its performance characteristics determined by the reporting laboratory.  This laboratory is certified under the Clinical Laboratory Improvement Amendments CLIA as qualified to perform high complexity clinical laboratory testing.    CIPROFLOXACIN  Value in next row Sensitive      4 SENSITIVEThis is a modified FDA-approved test that has been validated and its performance characteristics determined by the reporting laboratory.  This laboratory is certified under the Clinical Laboratory Improvement Amendments CLIA as qualified to perform high complexity clinical  laboratory testing.    GENTAMICIN  Value in next row Sensitive      4 SENSITIVEThis is a modified FDA-approved test that has been validated and its performance characteristics determined by the reporting laboratory.  This laboratory is certified under the Clinical Laboratory Improvement Amendments CLIA as qualified to perform high complexity clinical laboratory testing.    NITROFURANTOIN Value in next row Sensitive      4 SENSITIVEThis is a modified FDA-approved test that has been validated and its performance characteristics determined by the reporting laboratory.  This laboratory is certified under the Clinical Laboratory Improvement Amendments CLIA as qualified to perform high complexity clinical laboratory testing.    TRIMETH /SULFA  Value in next row Sensitive      4 SENSITIVEThis is a modified FDA-approved test that has been validated and its performance characteristics determined by the reporting laboratory.  This laboratory is certified under the Clinical Laboratory Improvement Amendments CLIA as qualified to perform high complexity clinical laboratory testing.    AMPICILLIN/SULBACTAM Value in next row Intermediate      4 SENSITIVEThis is a modified FDA-approved test that has been validated and its performance characteristics determined by the reporting laboratory.  This laboratory is certified under the Clinical Laboratory Improvement Amendments CLIA as qualified to perform high complexity clinical laboratory testing.    PIP/TAZO Value in next row Sensitive      8 SENSITIVEThis is a modified FDA-approved test that has been validated and its performance characteristics determined by the reporting laboratory.  This laboratory is certified under the Clinical Laboratory Improvement Amendments CLIA as qualified to perform high complexity clinical laboratory testing.    MEROPENEM Value in next row Sensitive      8 SENSITIVEThis is a modified FDA-approved test that has been validated and its performance  characteristics determined by the  reporting laboratory.  This laboratory is certified under the Clinical Laboratory Improvement Amendments CLIA as qualified to perform high complexity clinical laboratory testing.    * >=100,000 COLONIES/mL KLEBSIELLA PNEUMONIAE  Culture, blood (Routine X 2) w Reflex to ID Panel     Status: None (Preliminary result)   Collection Time: 12/22/23 11:58 AM   Specimen: BLOOD RIGHT ARM  Result Value Ref Range Status   Specimen Description BLOOD RIGHT ARM  Final   Special Requests   Final    BOTTLES DRAWN AEROBIC ONLY Blood Culture results may not be optimal due to an inadequate volume of blood received in culture bottles   Culture   Final    NO GROWTH 2 DAYS Performed at [facility], [address] [place], [place] [address]    Report Status PENDING  Incomplete  Culture, blood (Routine X 2) w Reflex to ID Panel     Status: None (Preliminary result)   Collection Time: 12/22/23 12:05 PM   Specimen: BLOOD RIGHT ARM  Result Value Ref Range Status   Specimen Description BLOOD RIGHT ARM  Final   Special Requests   Final    BOTTLES DRAWN AEROBIC ONLY Blood Culture results may not be optimal due to an inadequate volume of blood received in culture bottles   Culture   Final    NO GROWTH 2 DAYS Performed at [facility], [address] [place], [place] [address]    Report Status PENDING  Incomplete     Cathlyn July, NP [facility]  12/24/2023  11:13 AM

## 2023-12-24 NOTE — Assessment & Plan Note (Signed)
 4-6 bowel movements/day for the last ~2 days. Possible diet related vs 2/2 abx. Benign abdominal exam today, persistent umbilical hernia of transverse colon seen on recent CTAP - Loperamide  2mg  q4h PRN - Fiber is currently ordered - Encouraged yogurt and probiotics

## 2023-12-24 NOTE — Assessment & Plan Note (Signed)
 180s over 50s, sustained over the last 24 hours -Current home regimen amlodipine  10 mg and metoprolol  100 mg 3 times daily -Consider switching amlodipine  to chlorthalidone 12.5 mg outpatient, previously tried and [facility] 01/27/2018

## 2023-12-24 NOTE — Progress Notes (Incomplete)
 PHARMACY CONSULT NOTE FOR:  OUTPATIENT  PARENTERAL ANTIBIOTIC THERAPY (OPAT)  Indication:  Regimen:  End date:   IV antibiotic discharge orders are pended. To discharging provider:  please sign these orders via discharge navigator,  Select New Orders & click on the button choice - Manage This Unsigned Work.     Thank you for allowing pharmacy to be a part of this patient's care.  Abigail Wallace 12/24/2023, 10:56 AM

## 2023-12-24 NOTE — Progress Notes (Signed)
 EP will be available PRN.  Please call back if new needs arise.  See consult note from 12/23/23.      Daphne Barrack, NP-C, AGACNP-BC [facility] - Electrophysiology  12/24/2023, 7:37 AM

## 2023-12-25 ENCOUNTER — Other Ambulatory Visit: Payer: Self-pay

## 2023-12-25 DIAGNOSIS — G4733 Obstructive sleep apnea (adult) (pediatric): Secondary | ICD-10-CM | POA: Diagnosis not present

## 2023-12-25 DIAGNOSIS — R197 Diarrhea, unspecified: Secondary | ICD-10-CM | POA: Diagnosis not present

## 2023-12-25 DIAGNOSIS — N184 Chronic kidney disease, stage 4 (severe): Secondary | ICD-10-CM | POA: Diagnosis not present

## 2023-12-25 DIAGNOSIS — R410 Disorientation, unspecified: Secondary | ICD-10-CM

## 2023-12-25 DIAGNOSIS — Z950 Presence of cardiac pacemaker: Secondary | ICD-10-CM | POA: Diagnosis not present

## 2023-12-25 DIAGNOSIS — N1832 Chronic kidney disease, stage 3b: Secondary | ICD-10-CM | POA: Diagnosis not present

## 2023-12-25 DIAGNOSIS — I5033 Acute on chronic diastolic (congestive) heart failure: Secondary | ICD-10-CM | POA: Diagnosis not present

## 2023-12-25 LAB — CBC
HCT: 30.7 % — ABNORMAL LOW (ref 36.0–46.0)
Hemoglobin: 9.7 g/dL — ABNORMAL LOW (ref 12.0–15.0)
MCH: 29.2 pg (ref 26.0–34.0)
MCHC: 31.6 g/dL (ref 30.0–36.0)
MCV: 92.5 fL (ref 80.0–100.0)
Platelets: 272 K/uL (ref 150–400)
RBC: 3.32 MIL/uL — ABNORMAL LOW (ref 3.87–5.11)
RDW: 14.1 % (ref 11.5–15.5)
WBC: 15.8 K/uL — ABNORMAL HIGH (ref 4.0–10.5)
nRBC: 0 % (ref 0.0–0.2)

## 2023-12-25 LAB — BASIC METABOLIC PANEL WITH GFR
Anion gap: 7 (ref 5–15)
BUN: 37 mg/dL — ABNORMAL HIGH (ref 8–23)
CO2: 25 mmol/L (ref 22–32)
Calcium: 9.2 mg/dL (ref 8.9–10.3)
Chloride: 102 mmol/L (ref 98–111)
Creatinine, Ser: 2.77 mg/dL — ABNORMAL HIGH (ref 0.44–1.00)
GFR, Estimated: 17 mL/min — ABNORMAL LOW (ref >60)
Glucose, Bld: 251 mg/dL — ABNORMAL HIGH (ref 70–99)
Potassium: 4.1 mmol/L (ref 3.5–5.1)
Sodium: 134 mmol/L — ABNORMAL LOW (ref 135–145)

## 2023-12-25 LAB — MAGNESIUM: Magnesium: 2 mg/dL (ref 1.7–2.4)

## 2023-12-25 MED ORDER — FUROSEMIDE 10 MG/ML IJ SOLN
40.0000 mg | Freq: Four times a day (QID) | INTRAMUSCULAR | Status: AC
  Administered 2023-12-25 (×2): 40 mg via INTRAVENOUS
  Filled 2023-12-25 (×2): qty 4

## 2023-12-25 MED ORDER — PENICILLIN G POTASSIUM IV (FOR PTA / DISCHARGE USE ONLY): 12.0000 10*6.[IU] | INTRAVENOUS | 0 refills | Status: DC

## 2023-12-25 NOTE — Assessment & Plan Note (Addendum)
 unclear dry weight,  144.1 kg today.  Difficult to assess creatinine baseline given frequent hospitalizations, but previously believed to be 2.8, creatinine continues to improve 2.77 today, will continue to diurese.  - Redose lasix  40 mg IV q6h twice today - strict I/Os, not charted for a.m. shift, 1475 UOP overnight - daily weights, 144.1 kg today, down from 145.5 kg - 2L [place] O2 sats >90% overnight - Continue other meds, Breztri  and albuterol  q6h PRN - replete electrolytes to maintain Mg >2 and K >4,  - Courage CPAP nightly

## 2023-12-25 NOTE — Assessment & Plan Note (Addendum)
 VSS.  Leukocytosis stable at 15.8, will continue to monitor.  Pain is well-controlled.  - Continue IV Penicillin  G 12 million units daily - ID consulted, appreciate all their recs  - 6 weeks of IV penicillin  G, with lifelong suppression antibiotics after - Plan to place PICC line today - Bcx X2 12/7, NGTD 3 days - Cardiology and EP consulted and signed off - Palliative consulted and patient received any de-escalation of care - AM CBC, BMP, mag

## 2023-12-25 NOTE — Assessment & Plan Note (Signed)
 CKDIV - continue home sodium bicarb, avoid nephrotoxic agents.  pAF -  continue home eliquis , metoprolol , atrial paced on telemetry Schizophrenia - continue home olanzapine , depakote  Urinary retention - secondary to polio, chronic Foley Gout - continue home allopurinol 

## 2023-12-25 NOTE — Assessment & Plan Note (Signed)
 Much better systolic BPs last 24 hours, will continue current regimen -Current home regimen amlodipine  10 mg and metoprolol  100 mg 3 times daily

## 2023-12-25 NOTE — Progress Notes (Signed)
 Patient stated she does not want to wear CPAP and does not wear one at home.

## 2023-12-25 NOTE — Progress Notes (Signed)
 PHARMACY CONSULT NOTE FOR:  OUTPATIENT  PARENTERAL ANTIBIOTIC THERAPY (OPAT)  Indication: streptococcus mitis/oralis bacteremia Regimen: Penicillin  G 12 million units daily given as a continuous infusion End date: 02/01/2024  IV antibiotic discharge orders are pended. To discharging provider:  please sign these orders via discharge navigator,  Select New Orders & click on the button choice - Manage This Unsigned Work.     Thank you for allowing pharmacy to be a part of this patient's care.  Abigail Wallace 12/25/2023, 3:08 PM

## 2023-12-25 NOTE — Progress Notes (Signed)
 Daily Progress Note Intern Pager: [phone]  Patient name: Abigail Wallace [facility] Medical record number: [id number] Date of birth: 1945/02/21 Age: 78 y.o. Gender: female  Primary Care Provider: Delores Suzann HERO, MD Consultants: Infectious disease, cardiology, electrophysiology Code Status: Full code  Pt Overview and Major Events to Date:  12/5-admitted 12/8-TEE, no vegetations   Medical Decision Making:   This is an 78 year old female with PMH of CKD, pAfib, schizophrenia, HTN, OSA, COPD who is admitted for AMS, UTI bacteremia, exacerbation CHF exacerbation and acute COPD exacerbation.  Doing well today, VSS, in good spirits, pending PICC line today. Assessment & Plan Streptococcal bacteremia Altered mental status UTI (urinary tract infection) CKD stage IIIb Indwelling catheter present on admission Pacemaker infection VSS.  Leukocytosis stable at 15.8, will continue to monitor.  Pain is well-controlled.  - Continue IV Penicillin  G 12 million units daily - ID consulted, appreciate all their recs  - 6 weeks of IV penicillin  G, with lifelong suppression antibiotics after - Plan to place PICC line today - Bcx X2 12/7, NGTD 3 days - Cardiology and EP consulted and signed off - Palliative consulted and patient received any de-escalation of care - AM CBC, BMP, mag Acute on chronic diastolic heart failure (HCC) Chronic obstructive pulmonary disease (HCC) Obstructive sleep apnea unclear dry weight,  144.1 kg today.  Difficult to assess creatinine baseline given frequent hospitalizations, but previously believed to be 2.8, creatinine continues to improve 2.77 today, will continue to diurese.  - Redose lasix  40 mg IV q6h twice today - strict I/Os, not charted for a.m. shift, 1475 UOP overnight - daily weights, 144.1 kg today, down from 145.5 kg - 2L [place] O2 sats >90% overnight - Continue other meds, Breztri  and albuterol  q6h PRN - replete electrolytes to maintain Mg >2 and K >4,  -  Courage CPAP nightly Hypertension, isolated systolic Much better systolic BPs last 24 hours, will continue current regimen -Current home regimen amlodipine  10 mg and metoprolol  100 mg 3 times daily Frequent loose stools 4-6 bowel movements/day for the last ~2 days, with some improvement with Imodium . Possible diet related vs 2/2 abx. Benign abdominal exam today, persistent umbilical hernia of transverse colon seen on recent CTAP.  Lower concern for C. difficile given stable leukocytosis, less than 6 BMs per day, though patient is at high risk and will continue to consider symptoms do not improve. -Continue loperamide  2mg  q4h PRN - Can consider c. Diff testing - Enteric precautions - Continue FiberCon - Encouraged yogurt and probiotics Chronic health problem CKDIV - continue home sodium bicarb, avoid nephrotoxic agents.  pAF -  continue home eliquis , metoprolol , atrial paced on telemetry Schizophrenia - continue home olanzapine , depakote  Urinary retention - secondary to polio, chronic Foley Gout - continue home allopurinol    FEN/GI: Renal/carb modified PPx: Home Eliquis  Dispo: Home with home health, pending diuresis and PICC line.   Subjective:  Saw patient and his son [name] at bedside, doing very well today.  Reports she is feeling the best she has in a long while.  Refused CPAP overnight, though does not endorse worsening shortness of breath, chronic pains at baseline, denies any fevers though does endorse intermittent hot flashes.  Objective: Temp:  [97.7 F (36.5 C)-98.7 F (37.1 C)] 98.6 F (37 C) (12/10 0419) Pulse Rate:  [60-78] 60 (12/10 0419) Resp:  [15-20] 19 (12/10 0419) BP: (136-161)/(56-100) 153/59 (12/10 0419) SpO2:  [95 %-98 %] 96 % (12/10 0419) Weight:  [144.1 kg] 144.1  kg (12/10 0419) Physical Exam: General: Obese, frail-appearing Cardiovascular: Distant heart sounds, regular rate and rhythm, normal S1 and S2, no murmurs rubs or gallops Respiratory: Clear to  auscultation bilaterally, no wheezes or crackles, no work of breathing Abdomen: Obese abdomen with large pannus, umbilical hernia stable no erythema or tenderness, active bowel sounds Extremities: 2+ bilateral lower extremity pitting edema to ankles  Laboratory: Most recent CBC Lab Results  Component Value Date   WBC 15.8 (H) 12/25/2023   HGB 9.7 (L) 12/25/2023   HCT 30.7 (L) 12/25/2023   MCV 92.5 12/25/2023   PLT 272 12/25/2023   Most recent BMP    Latest Ref Rng & Units 12/25/2023    2:39 AM  BMP  Glucose 70 - 99 mg/dL 748   BUN 8 - 23 mg/dL 37   Creatinine 9.55 - 1.00 mg/dL 7.22   Sodium 864 - 854 mmol/L 134   Potassium 3.5 - 5.1 mmol/L 4.1   Chloride 98 - 111 mmol/L 102   CO2 22 - 32 mmol/L 25   Calcium  8.9 - 10.3 mg/dL 9.2     Other pertinent labs magnesium  2.0  Imaging/Diagnostic Tests: No new imaging  Lorrane Pac, MD 12/25/2023, 7:35 AM  PGY-1,  Family Medicine FPTS Intern pager: [phone], text pages welcome Secure chat group [facility]

## 2023-12-25 NOTE — Progress Notes (Signed)
 ID Brief note   Remains afebrile Labs with creatinine down to 2.77, WBC stable at 15.8, hemoglobin 9.7   12/7 repeat blood culture no growth in 3 days   Plan Place tunneled line for prolonged IV antibiotics See opat below ID will so, recall with questions or concerns D/w primary team   Diagnosis: Complicated strep mitis/oralis bacteremia  Culture Result: Strep mitis/oralis  Allergies  Allergen Reactions   Abilify [Aripiprazole] Anaphylaxis, Shortness Of Breath and Other (See Comments)    Tremors, also   Keflex  [Cephalexin ] Swelling and Other (See Comments)    Tongue swelling   Versacloz  [Clozapine ] Anaphylaxis   Ativan  [Lorazepam ] Other (See Comments)    Unknown reaction   Benadryl [Diphenhydramine Hcl] Other (See Comments)    Vision changes   Benztropine  Other (See Comments)    Mobility issues   Latuda [Lurasidone Hcl] Other (See Comments)    Tremors   Remeron [Mirtazapine] Other (See Comments)    Tremors   Haldol  [Haloperidol  Lactate] Other (See Comments)    Tardive dyskinesias  Tremors Somnolence Speech changes Sialorrhea    Codeine Other (See Comments)    Unknown reaction   Latex Rash    OPAT Orders Discharge antibiotics to be given via PICC line Discharge antibiotics: continuous penicillin  G infusion per pharmacy dosing Per pharmacy protocol  Duration: 6 weeks  End Date: 02/02/24  [facility] Care Per Protocol:  Home health RN for IV administration and teaching; PICC line care and labs.    Labs weekly while on IV antibiotics: X__ CBC with differential X__ BMP __ CMP __ CRP __ ESR __ Vancomycin trough __ CK  __ Please pull PIC at completion of IV antibiotics __ Please leave PIC in place until doctor has seen patient or been notified  Fax weekly labs to [phone]  Clinic Follow Up Appt: 12/29 at 3: 30 pm  Annalee Orem, MD Infectious Disease Physician [facility] [address] [place], [place] [address] Phone: [phone]  Fax: [phone]

## 2023-12-25 NOTE — TOC Progression Note (Addendum)
 Transition of Care [facility]) - Progression Note    Patient Details  Name: Abigail Wallace MRN: [id number] Date of Birth: 1945/08/06  Transition of Care [facility]) CM/SW Contact  Waddell Barnie Rama, RN Phone Number: 12/25/2023, 3:47 PM  Clinical Narrative:    Per ID patient is getting Picc line placed for long term IV ABX,  Holley Herring with [facility]  is following, will supply the IV medication,  Pam will do teaching with patient and son, [name].  Patient is ok with [facility] for infusion.  NCM sent referral thru portal , has been accepted .  Soc will begin 24 to 48 hrs post dc.  Per Son, states she is active with [facility],  NCM spoke with Jon to confirm she states yes she is not sure what services she has but they will be able to do the Home IV abxs.  Referral sent thru portal to [facility].   Expected Discharge Plan: Home w Home Health Services Barriers to Discharge: Continued Medical Work up               Expected Discharge Plan and Services   Discharge Planning Services: CM Consult Post Acute Care Choice: NA Living arrangements for the past 2 months: Single Family Home Expected Discharge Date: 12/22/23               DME Arranged: N/A DME Agency: NA       HH Arranged: NA           Social Drivers of Health (SDOH) Interventions SDOH Screenings   Food Insecurity: No Food Insecurity (12/20/2023)  Housing: Low Risk  (12/20/2023)  Transportation Needs: No Transportation Needs (12/20/2023)  Utilities: Not At Risk (12/20/2023)  Alcohol  Screen: Low Risk  (12/27/2020)  Depression (PHQ2-9): High Risk (09/30/2023)  Financial Resource Strain: Low Risk  (03/23/2022)  Physical Activity: Inactive (12/27/2020)  Social Connections: Socially Isolated (12/20/2023)  Stress: No Stress Concern Present (12/27/2020)  Tobacco Use: Medium Risk (12/20/2023)    Readmission Risk Interventions    12/20/2023    2:55 PM 11/26/2023   11:22 AM 06/19/2023    9:04 AM  Readmission Risk  Prevention Plan  Transportation Screening Complete Complete Complete  PCP or Specialist Appt within 5-7 Days   Complete  PCP or Specialist Appt within 3-5 Days  Complete   Home Care Screening   Complete  Medication Review (RN CM)   Referral to Pharmacy  HRI or Home Care Consult  Complete   Social Work Consult for Recovery Care Planning/Counseling  Complete   Palliative Care Screening  Not Applicable   Medication Review [facility]) Complete Referral to Pharmacy   PCP or Specialist appointment within 3-5 days of discharge Complete    HRI or Home Care Consult Complete    Palliative Care Screening Not Applicable

## 2023-12-25 NOTE — Assessment & Plan Note (Addendum)
 4-6 bowel movements/day for the last ~2 days, with some improvement with Imodium . Possible diet related vs 2/2 abx. Benign abdominal exam today, persistent umbilical hernia of transverse colon seen on recent CTAP.  Lower concern for C. difficile given stable leukocytosis, less than 6 BMs per day, though patient is at high risk and will continue to consider symptoms do not improve. -Continue loperamide  2mg  q4h PRN - Can consider c. Diff testing - Enteric precautions - Continue FiberCon - Encouraged yogurt and probiotics

## 2023-12-26 ENCOUNTER — Inpatient Hospital Stay (HOSPITAL_COMMUNITY)

## 2023-12-26 DIAGNOSIS — Z515 Encounter for palliative care: Secondary | ICD-10-CM | POA: Diagnosis not present

## 2023-12-26 DIAGNOSIS — Z7189 Other specified counseling: Secondary | ICD-10-CM | POA: Diagnosis not present

## 2023-12-26 HISTORY — PX: IR TUNNELED CENTRAL VENOUS CATH PLC W IMG: IMG1939

## 2023-12-26 LAB — BASIC METABOLIC PANEL WITH GFR
Anion gap: 9 (ref 5–15)
BUN: 42 mg/dL — ABNORMAL HIGH (ref 8–23)
CO2: 24 mmol/L (ref 22–32)
Calcium: 9.7 mg/dL (ref 8.9–10.3)
Chloride: 102 mmol/L (ref 98–111)
Creatinine, Ser: 2.66 mg/dL — ABNORMAL HIGH (ref 0.44–1.00)
GFR, Estimated: 18 mL/min — ABNORMAL LOW (ref >60)
Glucose, Bld: 325 mg/dL — ABNORMAL HIGH (ref 70–99)
Potassium: 4.4 mmol/L (ref 3.5–5.1)
Sodium: 135 mmol/L (ref 135–145)

## 2023-12-26 LAB — CBC
HCT: 30.8 % — ABNORMAL LOW (ref 36.0–46.0)
Hemoglobin: 9.8 g/dL — ABNORMAL LOW (ref 12.0–15.0)
MCH: 29.3 pg (ref 26.0–34.0)
MCHC: 31.8 g/dL (ref 30.0–36.0)
MCV: 92.2 fL (ref 80.0–100.0)
Platelets: 277 K/uL (ref 150–400)
RBC: 3.34 MIL/uL — ABNORMAL LOW (ref 3.87–5.11)
RDW: 14 % (ref 11.5–15.5)
WBC: 14.3 K/uL — ABNORMAL HIGH (ref 4.0–10.5)
nRBC: 0 % (ref 0.0–0.2)

## 2023-12-26 LAB — GLUCOSE, CAPILLARY
Glucose-Capillary: 296 mg/dL — ABNORMAL HIGH (ref 70–99)
Glucose-Capillary: 235 mg/dL — ABNORMAL HIGH (ref 70–99)

## 2023-12-26 LAB — MAGNESIUM: Magnesium: 2 mg/dL (ref 1.7–2.4)

## 2023-12-26 MED ORDER — LOPERAMIDE HCL 2 MG PO CAPS: 2.0000 mg | ORAL_CAPSULE | ORAL | Status: DC | PRN

## 2023-12-26 MED ORDER — INSULIN ASPART 100 UNIT/ML IJ SOLN
0.0000 [IU] | Freq: Three times a day (TID) | INTRAMUSCULAR | Status: DC
  Administered 2023-12-26: 5 [IU] via SUBCUTANEOUS
  Administered 2023-12-26: 3 [IU] via SUBCUTANEOUS
  Administered 2023-12-27 (×2): 1 [IU] via SUBCUTANEOUS
  Filled 2023-12-26 (×4): qty 1

## 2023-12-26 MED ORDER — LIDOCAINE-EPINEPHRINE 1 %-1:100000 IJ SOLN
INTRAMUSCULAR | Status: AC
  Filled 2023-12-26: qty 1

## 2023-12-26 MED ORDER — LIDOCAINE-EPINEPHRINE 1 %-1:100000 IJ SOLN
20.0000 mL | Freq: Once | INTRAMUSCULAR | Status: AC
  Administered 2023-12-26: 20 mL

## 2023-12-26 MED ADMIN — Linagliptin Tab 5 MG: 5 mg | ORAL | NDC 00597014061

## 2023-12-26 MED ADMIN — Furosemide Tab 40 MG: 80 mg | ORAL | NDC 00054829925

## 2023-12-26 MED FILL — Linagliptin Tab 5 MG: 5.0000 mg | ORAL | Qty: 1 | Status: AC

## 2023-12-26 MED FILL — Furosemide Tab 40 MG: 80.0000 mg | ORAL | Qty: 2 | Status: AC

## 2023-12-26 NOTE — Procedures (Signed)
 Vascular and Interventional Radiology Procedure Note  Patient: Abigail Wallace [facility] DOB: 1945/10/14 Medical Record Number: [id number] Note Date/Time: 12/26/2023 12:33 PM   Performing Physician: Thom Hall, MD Assistant(s): None  Diagnosis: Poor IV access  Procedure: TUNNELED CENTRAL VENOUS (PowerLine) CATHETER PLACEMENT  Anesthesia: Local Anesthetic Complications: None Estimated Blood Loss: Minimal Specimens:  None  Findings:  Successful placement of right-sided, 26 cm (tip-to-cuff), tunneled CVC (PowerLine) with the tip of the catheter in the proximal right atrium.  Plan: Catheter ready for use.  See detailed procedure note with images in PACS. The patient tolerated the procedure well without incident or complication and was returned to Recovery in stable condition.    Thom Hall, MD Vascular and Interventional Radiology Specialists [facility] Radiology   Pager. [phone] Clinic. [phone]

## 2023-12-26 NOTE — Assessment & Plan Note (Addendum)
 VSS.  Leukocytosis gradually improving, will continue to monitor.  Pain is well-controlled.  - Continue IV Penicillin  G 12 million units daily - Continuous cardiac monitoring, as requested by son - ID consulted, appreciate all their recs  - 6 weeks of IV penicillin  G, with lifelong suppression antibiotics after - IR consulted for placing tunneled catheter, plan for procedure today - Bcx X2 12/7, NGTD 4 days - AM BMP, mag

## 2023-12-26 NOTE — Progress Notes (Addendum)
 Daily Progress Note   Date: 12/26/2023   Patient Name: Abigail Wallace [facility]  DOB: Jan 29, 1945  MRN: [id number]  Age / Sex: 78 y.o., female  Attending Physician: Delores Suzann HERO, MD Primary Care Physician: Delores Suzann HERO, MD Admit Date: 12/20/2023 Length of Stay: 6 days  Reason for Follow-up: Establishing goals of care  Past Medical History:  Diagnosis Date   Abdominal wall hernia 01/2017   Advanced care planning/counseling discussion 09/29/2021   Atrial fibrillation (HCC)    CHF (congestive heart failure) (HCC)    CKD (chronic kidney disease)    COPD (chronic obstructive pulmonary disease) (HCC)    Coronary artery disease    NON-CRITICAL   Ectasis aorta    GERD (gastroesophageal reflux disease)    Gout    History of Sundowning 06/15/2023   Hypertension    Memory difficulties 12/02/2013   Pacemaker    Paranoid schizophrenia (HCC)    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         Tardive dyskinesia 04/26/2011   Type 2 diabetes mellitus [facility])     Assessment & Plan:   HPI/Patient Profile:   78 y.o. female  with past medical history of CKD IV, complete heart block with pacemaker, atrial fibrillation on Eliquis , schizophrenia admitted on 12/20/2023 with streptococcus mitis oralis bacteremia.    Per Infectious disease progress note on 12/22/2023 by Fleeta Rothman MD - patient positive for streptococcus mitis oralis bacteremia with sepsis. Concerns for endocarditis with possible vegetation. Per electrophysiology note on 12/23/2023 by Aniceto NP - TEE negative for vegetation. Repeat cultures showed NGTD as of 12/24/2023 after initial culture on 12/20/2023 positive for strep mitis oralis. Concerns for pacemaker as source of infection, patient is not an extraction candidate given comorbid conditions.    Palliative medicine consulted for goals of care conversation.   SUMMARY OF RECOMMENDATIONS Full code, full scope PMT  to continue to follow to support family and patient  Symptom Management:  Per primary team  Code Status: Full Code  Prognosis: Unable to determine  Discharge Planning: To Be Determined  Discussed with: Delores MD and Lorrane MD 12/26/2023 about conversation with son who has been struggling with being the sole decision maker in case the patient loses capacity as he wants to honor her wishes but it would go against his own wishes for her.   Subjective:   Subjective: Chart Reviewed. Updates received. Patient Assessed. Created space and opportunity for patient  and family to explore thoughts and feelings regarding current medical situation.  Today's Discussion:  Met with patient and son Isadora) at the bedside. Patient immediately told me upon greeting her that she wants to continue all aggressive measure and would never discontinue life support. I shared with patient that I understand her wishes and I just wanted to check in on her and her son to see how they were doing.   Son ([name]) requested to speak with me in private. [name] shares that he has been struggling to come to terms with the difficult decision he knows he will face in the future as he knows that the overall prognosis is poor and his mother will lose capacity at a certain point. He has also noticed that the patient has become more depressed in the last two days which may be due to her slowly understanding that she may deteriorate in the near future. He shares that he continues to speak with  his mother about her wishes and she shares that she wants to continue full code, full scope interventions. He has also asked the patient what if she does not improve and requires frequent hospitalization in the future and she tells him that we will cross that bridge when we get to it.   He shares that he does not feel that he has support from his family when he asks for their input on goals of care planning for the patient. They simply tell him that  they will support whatever decision he makes. He is aware of the wishes of the family as they do not want to continue aggressive measures, but he has a different bond with his mother who he has cared for the last 12 years and made many sacrifices to care for her. He also would not want to continue aggressive measures when the patient loses capacity or becomes dependent on full life support but is struggling with wanting to honor her wishes of full interventions.   I shared with him that I am sure that in the end his mother will be happy with whatever decision he makes for her. I also shared with him that when the time comes to make the decision to withdraw care, it is not him that is ending her life, but it is the disease process that is ending of her life. Reiterated with [name] that the medical team will support him fully with whatever decision he makes. [name] shares that he feels better after the discussion and thankful for the support from the medical team and staff.   Review of Systems  All other systems reviewed and are negative.   Objective:   Primary Diagnoses: Present on Admission:  Altered mental status  Acute on chronic diastolic heart failure (HCC)  CKD stage IIIb  Obstructive sleep apnea  Chronic obstructive pulmonary disease (HCC)  Urinary tract infection associated with indwelling urethral catheter  Artificial cardiac pacemaker  Streptococcal bacteremia  Hypertension, isolated systolic  Vital Signs:  BP (!) 144/71 (BP Location: Left Arm)   Pulse 72   Temp 97.9 F (36.6 C) (Oral)   Resp 18   Ht 5' 6 (1.676 m)   Wt (!) 146.3 kg   SpO2 97%   BMI 52.06 kg/m   Physical Exam Constitutional:      Appearance: She is obese. She is ill-appearing.  HENT:     Head: Normocephalic.     Nose: Nose normal.  Eyes:     Extraocular Movements: Extraocular movements intact.  Cardiovascular:     Rate and Rhythm: Tachycardia present.  Pulmonary:     Effort: Pulmonary effort is  normal.  Neurological:     Mental Status: She is alert. Mental status is at baseline.  Psychiatric:     Comments: Poor insight on health. Anxious.     Palliative Assessment/Data: 30%   Existing Vynca/ACP Documentation: None  Thank you for allowing us  to participate in the care of [facility] PMT will continue to support holistically.  I personally spent a total of 50 minutes in the care of the patient today including preparing to see the patient, performing a medically appropriate exam/evaluation, counseling and educating, referring and communicating with other health care professionals, and documenting clinical information in the EHR.   Fairy FORBES Shan DEVONNA  Palliative Medicine Team  Team Phone # [phone] (Nights/Weekends) 12/26/2023 12:48 PM

## 2023-12-26 NOTE — Assessment & Plan Note (Addendum)
 Her weight appears to be 145 kg, 146.3 this morning up slightly from yesterday.  Creatinine continues to improve with diuresis.  Will transition to p.o. formulation and plan to likely de-escalate diuresis tomorrow prior to discharge.  Was having a little more labored breathing today, likely 2/2 supine position and not using incentive spirometer. -Transition to p.o. Lasix  80 mg twice today - strict I/Os, UOP 2.5 L yesterday, -1.134 L total - daily weights, weight trended up slightly 146.3 from 144.1KG  - 2L [place] O2 sats >90% overnight - Continue other meds, Breztri  and albuterol  q6h PRN - replete electrolytes to maintain Mg >2 and K >4,  - Courage CPAP nightly

## 2023-12-26 NOTE — Progress Notes (Addendum)
 Daily Progress Note Intern Pager: [phone]  Patient name: Abigail Wallace [facility] Medical record number: [id number] Date of birth: 31-May-1945 Age: 78 y.o. Gender: female  Primary Care Provider: Delores Suzann HERO, MD Consultants: Infectious disease, cardiology, electrophysiology, interventional radiology Code Status: Full code   Pt Overview and Major Events to Date:  12/5-admitted 12/8-TEE, no vegetations 12/11-pending tunneled catheter placement with IR   Medical Decision Making:   This is an 78 year old female with PMH of CKD, pAfib, schizophrenia, HTN, OSA, COPD admitted with fevers and hypoxemia, found to heart failure with preserved EF in exacerbation and Streptococcus bacteremia.  Assessment & Plan Streptococcal bacteremia Altered mental status UTI (urinary tract infection) CKD stage IIIb Indwelling catheter present on admission Pacemaker infection VSS.  Leukocytosis gradually improving, will continue to monitor.  Pain is well-controlled.  - Continue IV Penicillin  G 12 million units daily - Continuous cardiac monitoring, as requested by son - ID consulted, appreciate all their recs  - 6 weeks of IV penicillin  G, with lifelong suppression antibiotics after - IR consulted for placing tunneled catheter, plan for procedure today - Bcx X2 12/7, NGTD 4 days - AM BMP, mag Acute on chronic diastolic heart failure (HCC) Chronic obstructive pulmonary disease (HCC) Obstructive sleep apnea Her weight appears to be 145 kg, 146.3 this morning up slightly from yesterday.  Creatinine continues to improve with diuresis.  Will transition to p.o. formulation and plan to likely de-escalate diuresis tomorrow prior to discharge.  Was having a little more labored breathing today, likely 2/2 supine position and not using incentive spirometer. -Transition to p.o. Lasix  80 mg twice today - strict I/Os, UOP 2.5 L yesterday, -1.134 L total - daily weights, weight trended up slightly 146.3 from  144.1KG  - 2L [place] O2 sats >90% overnight - Continue other meds, Breztri  and albuterol  q6h PRN - replete electrolytes to maintain Mg >2 and K >4,  - Courage CPAP nightly Hypertension, isolated systolic Much better systolic BPs last 24 hours, will continue current regimen -Current home regimen amlodipine  10 mg and metoprolol  100 mg 3 times daily Frequent loose stools Still having regular bowel movements 4-6 watery loosely formed stool a day.  Low suspicion for C. difficile given resolving leukocytosis, and mild frequency.  Still primarily psyched 2/2 antibiotics. - Continue loperamide  2mg  q4h PRN - Enteric precautions - Continue FiberCon - Encouraged yogurt and probiotics Chronic health problem CKDIV - continue home sodium bicarb, avoid nephrotoxic agents.  T2DM - restart Trajenta 5mg  daily, sensitive SSI, CBG's with meals. pAF -  continue home eliquis , metoprolol , atrial paced on telemetry Schizophrenia - continue home olanzapine , depakote  Urinary retention - secondary to polio, chronic Foley Gout - continue home allopurinol   FEN/GI: Renal/carb modified PPx: Home Eliquis  Dispo: Home with home health, pending tunneled catheter.  Subjective:  Saw patient at bedside with son Renay, was in no acute distress and had no acute events overnight.  Reported that she is feeling good about discharge tomorrow.  Denies any symptoms of nausea, vomiting, abdominal pain, pain at its usual baseline.  She does report that she feels like it is taking a little more work to breathe than usual, has not been using incentive spirometer and was lying flat today.  Objective: Temp:  [97.8 F (36.6 C)-98.7 F (37.1 C)] 98.7 F (37.1 C) (12/11 0400) Pulse Rate:  [60-72] 63 (12/11 0400) Resp:  [16-20] 18 (12/11 0400) BP: (146-173)/(72-125) 146/75 (12/11 0400) SpO2:  [97 %-100 %] 98 % (12/11 0400)  Weight:  [146.3 kg] 146.3 kg (12/11 0500) Physical Exam: General: Obese, frail-appearing Cardiovascular: Distant  heart sounds, regular rate and rhythm, no murmurs rubs or gallops Respiratory: Clear to auscultation bilaterally, no wheezes or crackles, mildly increased labored breathing Abdomen: Obese abdomen with large umbilical hernia, active bowel sounds, nontender to light or deep palpation Extremities: Trace edema bilateral ankles, nontender  Laboratory: Most recent CBC Lab Results  Component Value Date   WBC 14.3 (H) 12/26/2023   HGB 9.8 (L) 12/26/2023   HCT 30.8 (L) 12/26/2023   MCV 92.2 12/26/2023   PLT 277 12/26/2023   Most recent BMP    Latest Ref Rng & Units 12/26/2023    2:19 AM  BMP  Glucose 70 - 99 mg/dL 674   BUN 8 - 23 mg/dL 42   Creatinine 9.55 - 1.00 mg/dL 7.33   Sodium 864 - 854 mmol/L 135   Potassium 3.5 - 5.1 mmol/L 4.4   Chloride 98 - 111 mmol/L 102   CO2 22 - 32 mmol/L 24   Calcium  8.9 - 10.3 mg/dL 9.7     Other pertinent labs magnesium  2.0  Imaging/Diagnostic Tests: Pending tunneled central venous catheter with IR  Lorrane Pac, MD 12/26/2023, 7:26 AM  PGY-1, [facility] FPTS Intern pager: [phone], text pages welcome Secure chat group [facility] Teaching Service

## 2023-12-26 NOTE — Assessment & Plan Note (Addendum)
 Still having regular bowel movements 4-6 watery loosely formed stool a day.  Low suspicion for C. difficile given resolving leukocytosis, and mild frequency.  Still primarily psyched 2/2 antibiotics. - Continue loperamide  2mg  q4h PRN - Enteric precautions - Continue FiberCon - Encouraged yogurt and probiotics

## 2023-12-26 NOTE — Plan of Care (Signed)
   Problem: Education: Goal: Knowledge of General Education information will improve Description: Including pain rating scale, medication(s)/side effects and non-pharmacologic comfort measures Outcome: Progressing   Problem: Activity: Goal: Risk for activity intolerance will decrease Outcome: Progressing   Problem: Nutrition: Goal: Adequate nutrition will be maintained Outcome: Progressing

## 2023-12-26 NOTE — Assessment & Plan Note (Addendum)
 CKDIV - continue home sodium bicarb, avoid nephrotoxic agents.  T2DM - restart Trajenta 5mg  daily, sensitive SSI, CBG's with meals. pAF -  continue home eliquis , metoprolol , atrial paced on telemetry Schizophrenia - continue home olanzapine , depakote  Urinary retention - secondary to polio, chronic Foley Gout - continue home allopurinol 

## 2023-12-26 NOTE — Assessment & Plan Note (Addendum)
 Much better systolic BPs last 24 hours, will continue current regimen -Current home regimen amlodipine  10 mg and metoprolol  100 mg 3 times daily

## 2023-12-27 ENCOUNTER — Encounter: Payer: Self-pay | Admitting: Family Medicine

## 2023-12-27 ENCOUNTER — Telehealth: Payer: Self-pay | Admitting: Family Medicine

## 2023-12-27 ENCOUNTER — Other Ambulatory Visit (HOSPITAL_COMMUNITY): Payer: Self-pay

## 2023-12-27 DIAGNOSIS — R7881 Bacteremia: Secondary | ICD-10-CM | POA: Diagnosis not present

## 2023-12-27 DIAGNOSIS — B955 Unspecified streptococcus as the cause of diseases classified elsewhere: Secondary | ICD-10-CM | POA: Diagnosis not present

## 2023-12-27 LAB — CULTURE, BLOOD (ROUTINE X 2)
Culture: NO GROWTH
Culture: NO GROWTH

## 2023-12-27 LAB — GLUCOSE, CAPILLARY
Glucose-Capillary: 147 mg/dL — ABNORMAL HIGH (ref 70–99)
Glucose-Capillary: 150 mg/dL — ABNORMAL HIGH (ref 70–99)

## 2023-12-27 MED ORDER — LINAGLIPTIN 5 MG PO TABS
5.0000 mg | ORAL_TABLET | Freq: Every day | ORAL | 0 refills | Status: AC
  Filled 2023-12-27: qty 30, 30d supply, fill #0

## 2023-12-27 MED ORDER — CALCIUM POLYCARBOPHIL 625 MG PO TABS: 625.0000 mg | ORAL_TABLET | Freq: Every day | ORAL | Status: DC

## 2023-12-27 MED ORDER — FUROSEMIDE 40 MG PO TABS
80.0000 mg | ORAL_TABLET | Freq: Every day | ORAL | 0 refills | Status: DC
  Filled 2023-12-27: qty 60, 30d supply, fill #0

## 2023-12-27 MED ORDER — LOPERAMIDE HCL 2 MG PO CAPS
2.0000 mg | ORAL_CAPSULE | ORAL | 0 refills | Status: AC | PRN
  Filled 2023-12-27: qty 30, 30d supply, fill #0

## 2023-12-27 MED ADMIN — Furosemide Tab 40 MG: 80 mg | ORAL | NDC 00054829925

## 2023-12-27 MED ADMIN — Linagliptin Tab 5 MG: 5 mg | ORAL | NDC 00597014030

## 2023-12-27 NOTE — Discharge Summary (Addendum)
 Family Medicine Teaching [facility] Discharge Summary  Patient name: Abigail Wallace [facility] Medical record number: [id number] Date of birth: [birth year] Age: 78 y.o. Gender: female Date of Admission: 12/20/2023  Date of Discharge: 12/27/2023 Admitting Physician: Leafy Scriver, DO  Primary Care Provider: Delores Suzann HERO, MD Consultants: IR, infectious disease, cardiology, palliative medicine  Indication for Hospitalization: Sepsis 2/2 Strep mitis bacteremia   Discharge Diagnoses/Problem List:  Principal Problem for Admission: Streptococcal bacteremia Other Problems addressed during stay:  Principal Problem:   Streptococcal bacteremia Active Problems:   Chronic obstructive pulmonary disease (HCC)   Obstructive sleep apnea   Hypertension, isolated systolic   CKD stage IIIb   Artificial cardiac pacemaker   Urinary tract infection associated with indwelling urethral catheter   Indwelling catheter present on admission   Acute on chronic diastolic heart failure (HCC)   Chronic kidney disease (CKD), stage 4 (HCC)   Pacemaker infection   Chronic nausea   Frequent loose stools    Brief Hospital Course:  78 year old female with PMH of CKD, pAfib, schizophrenia, HTN, OSA, COPD, CHF who was admitted to the [facility] family medicine teaching service with UTI, CHF exacerbation, and COPD exacerbation.  Her hospital course is detailed below:   Bacteremia  SSS s/p Pacemaker Patient initially admitted for UTI and altered mental status.  Blood cultures grew strep mitis/oralis 4/4 cultures and antibiotic regimen transitioned from bactrim  to PO Augmentin  (12/5-12/6). Further bl cx growth with streptococcus mitas/oralis and pansensitive, concerning for endocarditis.  ID was contacted and patient was transitioned patient transitioned to IV CTX (12/6-12/9) and subsequently switched to IV penicillin  G infusions.  Cardiology was consulted who recommended TEE.  TEE revealed no evidence of  vegetation. EP was consulted regarding pacemaker and determined she would need lifetime antibiotic suppression and not a good surgical candidate.  PICC line was placed on 12/11 per ID recs.  Patient was discharged with a PICC line with long-term plan of 6 weeks of IV penicillin  G infusions with subsequent lifelong suppression antibiotics.  UTI Patient admitted with nausea vomiting, dysuria, suprapubic pain and reported altered mental status though appropriately oriented at time of admission.  UA with leukocytes, nitrites, and bacteria.  Patient started on p.o. Bactrim  (12/5), given past UTI resistance patterns. Further antibiotics as below, per bacteremia section. Urine culture grew klebsiella pneumonia with sensitivities to everything but ampicillin and Unasyn.    Altered Mental Status- likely due to bacteremia  Notably, patient reported dizziness and vision changes for several weeks ongoing during her admission.  CT head without acute abnormality, but with findings of brain atrophy.  Pacemaker incompatible with MRI, and given duration of symptoms, felt CT head appropriately ruled out stroke.   CHF exacerbation Heart failure preserved EF, acute on chronic  Given patient reported dyspnea, BNP elevated at 718, and pulmonary congestion noted on chest x-ray, patient diuresed with IV Lasix .  Patient had good urine output and kidney function was trended, pt was transitioned to oral diuretics and returned to dry weight of 144 kg.  Was discharged on slightly increased home diuresis, from 80 mg every other day to 80 mg twice daily.  COPD exacerbation Patient also with expiratory wheeze and increased expiratory phase in association with dyspnea concerning for COPD exacerbation.  Home inhaler continued and albuterol  nebulizers scheduled.  Oral prednisone  started complete a 5-day course (12/5-12/10).  Patient's respiratory status significantly improved.  Goals of care Given patient's ongoing decline, palliative  care was consulted.  Palliative care had discussion with  patient along with son, and she insisted on continuing all care measures and remaining full code.  Palliative recommended further conversations outpatient.  PCP follow up recommendations: Ensure 3 year ultrasound follow up on 3.9 cm infrarenal AAA Consider renal mass protocol CT or MRI for multiple renal cortical lesions Follow-up kidney function with increased Lasix  order Consider continuing palliative care discussion    Results/Tests Pending at Time of Discharge:  Unresulted Labs (From admission, onward)     Start     Ordered   12/27/23 0500  Basic metabolic panel  Tomorrow morning,   R       Question:  Specimen collection method  Answer:  Lab=Lab collect   12/26/23 1118   12/27/23 0500  Magnesium   Tomorrow morning,   R       Question:  Specimen collection method  Answer:  Lab=Lab collect   12/26/23 1118            Disposition: Home with home health/PT  Discharge Condition: Stable  Discharge Exam:  Vitals:   12/27/23 0809 12/27/23 1150  BP:  (!) 144/62  Pulse: 69 72  Resp: 20 20  Temp:  97.8 F (36.6 C)  SpO2: 98% 98%   General: Resting comfortably in no acute distress  cardiovascular: Distant heart sounds, regular rate rhythm, normal S1 and S2, no murmurs rubs or gallops Respiratory: Clear to auscultation bilaterally, no wheezes or crackles Abdomen: Obese abdomen, stable large umbilical herniation, active bowel sounds, nondistended, nontender Extremities: Mild 1+ pitting edema bilateral lower ankles, nontender  Significant Procedures: IR tunneled central venous catheter  Significant Labs and Imaging:  Recent Labs  Lab 12/26/23 0219  WBC 14.3*  HGB 9.8*  HCT 30.8*  PLT 277   Recent Labs  Lab 12/26/23 0219  NA 135  K 4.4  CL 102  CO2 24  GLUCOSE 325*  BUN 42*  CREATININE 2.66*  CALCIUM  9.7  MG 2.0    CXR 12/5 IMPRESSION: Cardiomegaly with mild pulmonary vascular congestion.  CT head  without contrast 12/5 IMPRESSION: 1. No acute intracranial abnormality. 2. Unchanged small left frontal meningioma measuring 8 mm. 3. Prominent sulci and ventricles compatible with brain atrophy.  CT abdomen pelvis without contrast IMPRESSION: 1. Stable 3.9 cm infrarenal abdominal aortic aneurysm with extensive aortoiliac atherosclerotic calcification, recommend ultrasound follow-up in 3 years per society for vascular surgery guidelines. 2. Atrophic kidneys with multiple exophytic cortical lesions, several of which are indeterminate on this single-phase exam, recommend dedicated renal mass protocol CT or MRI when feasible, if clinically indicated . 3. Large umbilical hernia containing the majority of the transverse colon. No obstruction. 4. Mild diverticulosis within a redundant sigmoid colon without superimposed inflammatory change 5. Mild cardiomegaly. 6. Moderate right coronary artery calcification. 7. Advanced degenerative changes of the right hip.   Discharge Medications:  Allergies as of 12/27/2023       Reactions   Abilify [aripiprazole] Anaphylaxis, Shortness Of Breath, Other (See Comments)   Tremors, also   Keflex  [cephalexin ] Swelling, Other (See Comments)   Tongue swelling   Versacloz  [clozapine ] Anaphylaxis   Ativan  [lorazepam ] Other (See Comments)   Unknown reaction   Benadryl [diphenhydramine Hcl] Other (See Comments)   Vision changes   Benztropine  Other (See Comments)   Mobility issues   Latuda [lurasidone Hcl] Other (See Comments)   Tremors   Remeron [mirtazapine] Other (See Comments)   Tremors   Haldol  [haloperidol  Lactate] Other (See Comments)   Tardive dyskinesias  Tremors Somnolence Speech changes Sialorrhea  Codeine Other (See Comments)   Unknown reaction   Latex Rash        Medication List     PAUSE taking these medications    polyethylene glycol powder 17 GM/SCOOP powder Wait to take this until your doctor or other care provider  tells you to start again. Commonly known as: GLYCOLAX /MIRALAX  Take 17 g by mouth 3 (three) times daily.   senna 8.6 MG Tabs tablet Wait to take this until your doctor or other care provider tells you to start again. Commonly known as: SENOKOT Take 1 tablet (8.6 mg total) by mouth daily as needed for mild constipation. What changed: when to take this       STOP taking these medications    Semaglutide (0.25 or 0.5MG /DOS) 2 MG/1.5ML Sopn       TAKE these medications    Accu-Chek Guide Test test strip Generic drug: glucose blood Use to check blood glucose once daily and as needed for symptoms   acetaminophen  500 MG tablet Commonly known as: TYLENOL  Take 500-1,000 mg by mouth every 6 (six) hours as needed for mild pain (pain score 1-3) or headache.   albuterol  108 (90 Base) MCG/ACT inhaler Commonly known as: VENTOLIN  HFA Inhale 2 puffs into the lungs every 6 (six) hours as needed for wheezing or shortness of breath.   allopurinol  100 MG tablet Commonly known as: ZYLOPRIM  Take 0.5 tablets (50 mg total) by mouth every other day. What changed: when to take this   amLODipine  10 MG tablet Commonly known as: NORVASC  Take 1 tablet (10 mg total) by mouth at bedtime. What changed: when to take this   apixaban  2.5 MG Tabs tablet Commonly known as: ELIQUIS  Take 1 tablet (2.5 mg total) by mouth 2 (two) times daily.   cholecalciferol  25 MCG (1000 UNIT) tablet Commonly known as: VITAMIN D3 Take 1 tablet (1,000 Units total) by mouth daily.   diclofenac  Sodium 1 % Gel Commonly known as: Voltaren  Apply 2 g topically 4 (four) times daily as needed (pain).   divalproex  250 MG DR tablet Commonly known as: DEPAKOTE  Take 1 tablet (250 mg total) by mouth every 12 (twelve) hours.   freestyle lancets Use to check blood glucose once daily and as needed for symptoms   furosemide  40 MG tablet Commonly known as: Lasix  Take 2 tablets (80 mg total) by mouth daily. What changed: when to  take this   ipratropium-albuterol  0.5-2.5 (3) MG/3ML Soln Commonly known as: DUONEB Take 3 mLs by nebulization every 4 (four) hours as needed. What changed:  when to take this additional instructions   linagliptin  5 MG Tabs tablet Commonly known as: TRADJENTA  Take 1 tablet (5 mg total) by mouth daily.   loperamide  2 MG capsule Commonly known as: IMODIUM  Take 1 capsule (2 mg total) by mouth as needed for diarrhea or loose stools.   metoprolol  tartrate 100 MG tablet Commonly known as: LOPRESSOR  Take 1 tablet (100 mg total) by mouth 3 (three) times daily.   Multivitamin Women 50+ Tabs Take 1 tablet by mouth daily with breakfast.   nitroGLYCERIN  0.4 MG SL tablet Commonly known as: NITROSTAT  Place 1 tablet (0.4 mg total) under the tongue every 5 (five) minutes as needed for chest pain.   nystatin  powder Commonly known as: nystatin  Apply 1 Application topically daily. Apply after bathing. What changed:  when to take this additional instructions   nystatin  cream Commonly known as: MYCOSTATIN  Apply 1 Application topically daily as needed (Rash). What changed:  when to  take this additional instructions   OLANZapine  10 MG tablet Commonly known as: ZYPREXA  Take 15 mg by mouth at bedtime.   OVER THE COUNTER MEDICATION Apply 1 application  topically See admin instructions. Caldesene Medicated Protecting Powder- Apply under the breasts and between the skin folds 2 times a day as needed for irritation   oxyCODONE  5 MG immediate release tablet Commonly known as: Roxicodone  Take 0.5 tablets (2.5 mg total) by mouth every 12 (twelve) hours as needed for severe pain (pain score 7-10).   penicillin  G IVPB Inject 12 Million Units into the vein daily. Indication:  Streptococcal mitis/oralis bacteremia First Dose: Yes Last Day of Therapy:  02/01/2024 Labs - Once weekly:  CBC/D and BMP, Labs - Once weekly: ESR and CRP Infuse 12 million units IV daily as a continuous infusion Method  of administration: Elastomeric (Continuous infusion) Method of administration may be changed at the discretion of home infusion pharmacist based upon assessment of the patient and/or caregiver's ability to self-administer the medication ordered.   polycarbophil 625 MG tablet Commonly known as: FIBERCON Take 1 tablet (625 mg total) by mouth daily.   pravastatin  40 MG tablet Commonly known as: PRAVACHOL  Take 1 tablet (40 mg total) by mouth every evening. What changed: when to take this   sodium bicarbonate  650 MG tablet Take 1 tablet (650 mg total) by mouth 3 (three) times daily.   Trelegy Ellipta  100-62.5-25 MCG/ACT Aepb Generic drug: Fluticasone -Umeclidin-Vilant Inhale 1 puff into the lungs daily.   triamcinolone  ointment 0.5 % Commonly known as: KENALOG  Apply 1 Application topically 2 (two) times daily as needed (skin irritation).               Discharge Care Instructions  (From admission, onward)           Start     Ordered   12/25/23 0000  Change dressing on IV access line weekly and PRN  (Home infusion instructions - [facility] )        12/25/23 1603            Discharge Instructions: Please refer to Patient Instructions section of EMR for full details.  Patient was counseled important signs and symptoms that should prompt return to medical care, changes in medications, dietary instructions, activity restrictions, and follow up appointments.   Follow-Up Appointments:  Contact information for follow-up providers     Delores Suzann HERO, MD Follow up on 01/14/2024.   Specialty: Family Medicine Why: has a home visit with Dr. Delores already, she only wants to see Dr. Delores Pass information: [address] [place] [address] [phone]         [facility] [address] [place], [place] (DME) dba [facility] Follow up.   Specialty: DME Services Why: Supplies IV medication and HHRN for IV infusion Contact information: [address] [place]  [phone]             Contact information for after-discharge care     Home Medical Care     [facility]) .   Service: Home Health Services Contact information: [address] [place] [address] [place]  [address] [phone]                   Lorrane Pac, MD 12/27/2023, 11:35 AM PGY-1, [facility] Family Medicine  I have reviewed and agree with the findings and recommendations above.  Gloriann Ogren, MD  12/27/2023, 2:18 PM

## 2023-12-27 NOTE — Telephone Encounter (Signed)
 Please call [facility] and request CBC and BMP on 12/15-12/16.   Suzann Daring, MD  Family Medicine Teaching Service

## 2023-12-27 NOTE — TOC CM/SW Note (Signed)
 Note previous CM note that pt is going home today w/ IV abx arranged with [facility] and [name] for [facility]. Transport set up for 4pm. Call made to [facility] to confirm plan for continuous PCN-G needs on discharge- per Pam her home abx are planned to be delivered to the home this afternoon between 4-6pm and family will then hook her up to the home infusion to get pt on a daily schedule that works for them. Pam voiced that family has been educated and she has been in contact with them today regarding the delivery timeframe for home meds. Family aware of plan and bedside RN will need to un-hook pt from hospital infusion when transport arrives. Updated bedside RN via [employer].

## 2023-12-27 NOTE — Plan of Care (Signed)
   Problem: Education: Goal: Knowledge of General Education information will improve Description: Including pain rating scale, medication(s)/side effects and non-pharmacologic comfort measures Outcome: Progressing   Problem: Health Behavior/Discharge Planning: Goal: Ability to manage health-related needs will improve Outcome: Progressing   Problem: Nutrition: Goal: Adequate nutrition will be maintained Outcome: Progressing

## 2023-12-27 NOTE — Telephone Encounter (Signed)
 Called and spoke with Shona at [facility]. Verbal orders given.   [name], RN

## 2023-12-27 NOTE — Plan of Care (Signed)

## 2023-12-27 NOTE — TOC Transition Note (Signed)
 Transition of Care [facility]) - Discharge Note   Patient Details  Name: Abigail Wallace MRN: [id number] Date of Birth: [birth year]  Transition of [facility]) CM/SW Contact:  Waddell Barnie Rama, RN Phone Number: 12/27/2023, 11:08 AM   Clinical Narrative:    For dc today, [facility] will transport her home at 4 pm today.  [name] and [facility] is aware of the dc today and her son [name].     Barriers to Discharge: Continued Medical Work up   Patient Goals and CMS Choice Patient states their goals for this hospitalization and ongoing recovery are:: return home   Choice offered to / list presented to : NA      Discharge Placement                       Discharge Plan and Services Additional resources added to the After Visit Summary for     Discharge Planning Services: CM Consult Post Acute Care Choice: NA          DME Arranged: N/A DME Agency: NA       HH Arranged: NA          Social Drivers of Health (SDOH) Interventions SDOH Screenings   Food Insecurity: No Food Insecurity (12/20/2023)  Housing: Low Risk (12/20/2023)  Transportation Needs: No Transportation Needs (12/20/2023)  Utilities: Not At Risk (12/20/2023)  Alcohol  Screen: Low Risk (12/27/2020)  Depression (PHQ2-9): High Risk (09/30/2023)  Financial Resource Strain: Low Risk (03/23/2022)  Physical Activity: Inactive (12/27/2020)  Social Connections: Socially Isolated (12/20/2023)  Stress: No Stress Concern Present (12/27/2020)  Tobacco Use: Medium Risk (12/20/2023)     Readmission Risk Interventions    12/20/2023    2:55 PM 11/26/2023   11:22 AM 06/19/2023    9:04 AM  Readmission Risk Prevention Plan  Transportation Screening Complete Complete Complete  PCP or Specialist Appt within 5-7 Days   Complete  PCP or Specialist Appt within 3-5 Days  Complete   Home Care Screening   Complete  Medication Review (RN CM)   Referral to Pharmacy  HRI or Home Care Consult  Complete   Social Work  Consult for Recovery Care Planning/Counseling  Complete   Palliative Care Screening  Not Applicable   Medication Review [employer]) Complete Referral to Pharmacy   PCP or Specialist appointment within 3-5 days of discharge Complete    HRI or Home Care Consult Complete    Palliative Care Screening Not Applicable

## 2023-12-29 ENCOUNTER — Telehealth: Payer: Self-pay | Admitting: Family Medicine

## 2023-12-29 NOTE — Telephone Encounter (Signed)
 After hours call  Son Abigail Wallace called to tell me that his mother had a temperature of 99.4 earlier today that had went down to 98.2 on its own.  He asked if he could use Tylenol , I told him yes.  He also mentions that he did not flush or put heparin through the red port last night whenever he started the infusion at 11 PM.  He is concerned this could pose a problem.  I advised him to attempt to flush and heparinized the port going forward.  He also asked if he could switch to 8 PM infusions to better accommodate his work schedule to which I agreed.  He also has concerns with his mother's persistent cough and congestion that happened while in the hospital.  He feels it could be due to the TEE that she had performed.  He does not feel like she has fluid overload since she has a net output of 700 mL. He states his mom will not take Flonase.  I advised against antihistamines given her age group.  I also advised against other OTC cough medications given the lack of efficacy, patient's age, and multiple comorbidities.  I recommended he use honey and lemon and warm tea and to let us  know if her condition worsens.

## 2023-12-30 ENCOUNTER — Telehealth: Payer: Self-pay | Admitting: *Deleted

## 2023-12-30 ENCOUNTER — Emergency Department (HOSPITAL_COMMUNITY)

## 2023-12-30 ENCOUNTER — Inpatient Hospital Stay (HOSPITAL_COMMUNITY)
Admission: EM | Admit: 2023-12-30 | Discharge: 2024-01-03 | DRG: 291 | Disposition: A | Discharge status: Home Health | Attending: Family Medicine | Admitting: Family Medicine

## 2023-12-30 DIAGNOSIS — I480 Paroxysmal atrial fibrillation: Secondary | ICD-10-CM | POA: Diagnosis present

## 2023-12-30 DIAGNOSIS — R7881 Bacteremia: Secondary | ICD-10-CM | POA: Diagnosis present

## 2023-12-30 DIAGNOSIS — J210 Acute bronchiolitis due to respiratory syncytial virus: Secondary | ICD-10-CM | POA: Diagnosis not present

## 2023-12-30 DIAGNOSIS — Z978 Presence of other specified devices: Secondary | ICD-10-CM

## 2023-12-30 DIAGNOSIS — Z9981 Dependence on supplemental oxygen: Secondary | ICD-10-CM

## 2023-12-30 DIAGNOSIS — Z881 Allergy status to other antibiotic agents status: Secondary | ICD-10-CM

## 2023-12-30 DIAGNOSIS — Z1152 Encounter for screening for COVID-19: Secondary | ICD-10-CM

## 2023-12-30 DIAGNOSIS — Y831 Surgical operation with implant of artificial internal device as the cause of abnormal reaction of the patient, or of later complication, without mention of misadventure at the time of the procedure: Secondary | ICD-10-CM | POA: Diagnosis present

## 2023-12-30 DIAGNOSIS — M109 Gout, unspecified: Secondary | ICD-10-CM | POA: Diagnosis present

## 2023-12-30 DIAGNOSIS — G4733 Obstructive sleep apnea (adult) (pediatric): Secondary | ICD-10-CM | POA: Diagnosis present

## 2023-12-30 DIAGNOSIS — Z792 Long term (current) use of antibiotics: Secondary | ICD-10-CM

## 2023-12-30 DIAGNOSIS — J9601 Acute respiratory failure with hypoxia: Secondary | ICD-10-CM | POA: Diagnosis not present

## 2023-12-30 DIAGNOSIS — N179 Acute kidney failure, unspecified: Secondary | ICD-10-CM | POA: Diagnosis present

## 2023-12-30 DIAGNOSIS — Z8249 Family history of ischemic heart disease and other diseases of the circulatory system: Secondary | ICD-10-CM

## 2023-12-30 DIAGNOSIS — E1122 Type 2 diabetes mellitus with diabetic chronic kidney disease: Secondary | ICD-10-CM

## 2023-12-30 DIAGNOSIS — K219 Gastro-esophageal reflux disease without esophagitis: Secondary | ICD-10-CM | POA: Diagnosis present

## 2023-12-30 DIAGNOSIS — T827XXA Infection and inflammatory reaction due to other cardiac and vascular devices, implants and grafts, initial encounter: Secondary | ICD-10-CM | POA: Diagnosis present

## 2023-12-30 DIAGNOSIS — J441 Chronic obstructive pulmonary disease with (acute) exacerbation: Secondary | ICD-10-CM | POA: Diagnosis present

## 2023-12-30 DIAGNOSIS — Z7901 Long term (current) use of anticoagulants: Secondary | ICD-10-CM

## 2023-12-30 DIAGNOSIS — M546 Pain in thoracic spine: Secondary | ICD-10-CM

## 2023-12-30 DIAGNOSIS — Z7985 Long-term (current) use of injectable non-insulin antidiabetic drugs: Secondary | ICD-10-CM

## 2023-12-30 DIAGNOSIS — I5033 Acute on chronic diastolic (congestive) heart failure: Secondary | ICD-10-CM | POA: Diagnosis present

## 2023-12-30 DIAGNOSIS — I495 Sick sinus syndrome: Secondary | ICD-10-CM | POA: Diagnosis present

## 2023-12-30 DIAGNOSIS — I5043 Acute on chronic combined systolic (congestive) and diastolic (congestive) heart failure: Secondary | ICD-10-CM | POA: Diagnosis not present

## 2023-12-30 DIAGNOSIS — I509 Heart failure, unspecified: Secondary | ICD-10-CM

## 2023-12-30 DIAGNOSIS — Z79899 Other long term (current) drug therapy: Secondary | ICD-10-CM

## 2023-12-30 DIAGNOSIS — Z9049 Acquired absence of other specified parts of digestive tract: Secondary | ICD-10-CM

## 2023-12-30 DIAGNOSIS — E1165 Type 2 diabetes mellitus with hyperglycemia: Secondary | ICD-10-CM | POA: Diagnosis present

## 2023-12-30 DIAGNOSIS — I130 Hypertensive heart and chronic kidney disease with heart failure and stage 1 through stage 4 chronic kidney disease, or unspecified chronic kidney disease: Secondary | ICD-10-CM | POA: Diagnosis present

## 2023-12-30 DIAGNOSIS — Z789 Other specified health status: Secondary | ICD-10-CM

## 2023-12-30 DIAGNOSIS — F200 Paranoid schizophrenia: Secondary | ICD-10-CM | POA: Diagnosis present

## 2023-12-30 DIAGNOSIS — N1832 Chronic kidney disease, stage 3b: Secondary | ICD-10-CM

## 2023-12-30 DIAGNOSIS — J9621 Acute and chronic respiratory failure with hypoxia: Principal | ICD-10-CM | POA: Diagnosis present

## 2023-12-30 DIAGNOSIS — Z91040 Latex allergy status: Secondary | ICD-10-CM

## 2023-12-30 DIAGNOSIS — R531 Weakness: Secondary | ICD-10-CM | POA: Diagnosis not present

## 2023-12-30 DIAGNOSIS — B974 Respiratory syncytial virus as the cause of diseases classified elsewhere: Secondary | ICD-10-CM | POA: Diagnosis present

## 2023-12-30 DIAGNOSIS — Z95810 Presence of automatic (implantable) cardiac defibrillator: Secondary | ICD-10-CM

## 2023-12-30 DIAGNOSIS — I5023 Acute on chronic systolic (congestive) heart failure: Secondary | ICD-10-CM | POA: Diagnosis not present

## 2023-12-30 DIAGNOSIS — N184 Chronic kidney disease, stage 4 (severe): Secondary | ICD-10-CM | POA: Diagnosis present

## 2023-12-30 DIAGNOSIS — Z6841 Body Mass Index (BMI) 40.0 and over, adult: Secondary | ICD-10-CM

## 2023-12-30 DIAGNOSIS — E119 Type 2 diabetes mellitus without complications: Secondary | ICD-10-CM

## 2023-12-30 DIAGNOSIS — E66813 Obesity, class 3: Secondary | ICD-10-CM | POA: Diagnosis present

## 2023-12-30 DIAGNOSIS — Z888 Allergy status to other drugs, medicaments and biological substances status: Secondary | ICD-10-CM

## 2023-12-30 DIAGNOSIS — B338 Other specified viral diseases: Secondary | ICD-10-CM | POA: Insufficient documentation

## 2023-12-30 DIAGNOSIS — I2510 Atherosclerotic heart disease of native coronary artery without angina pectoris: Secondary | ICD-10-CM | POA: Diagnosis present

## 2023-12-30 DIAGNOSIS — Z885 Allergy status to narcotic agent status: Secondary | ICD-10-CM

## 2023-12-30 DIAGNOSIS — Z7951 Long term (current) use of inhaled steroids: Secondary | ICD-10-CM

## 2023-12-30 LAB — CBC WITH DIFFERENTIAL/PLATELET
Basophils Absolute: 0.2 K/uL — ABNORMAL HIGH (ref 0.0–0.1)
Basophils Relative: 2 %
Eosinophils Absolute: 0.1 K/uL (ref 0.0–0.5)
Eosinophils Relative: 1 %
HCT: 30.2 % — ABNORMAL LOW (ref 36.0–46.0)
Hemoglobin: 9.3 g/dL — ABNORMAL LOW (ref 12.0–15.0)
Lymphocytes Relative: 10 %
Lymphs Abs: 1.1 K/uL (ref 0.7–4.0)
MCH: 29.5 pg (ref 26.0–34.0)
MCHC: 30.8 g/dL (ref 30.0–36.0)
MCV: 95.9 fL (ref 80.0–100.0)
Monocytes Absolute: 0.6 K/uL (ref 0.1–1.0)
Monocytes Relative: 5 %
Neutro Abs: 9 K/uL — ABNORMAL HIGH (ref 1.7–7.7)
Neutrophils Relative %: 82 %
Platelets: 168 K/uL (ref 150–400)
RBC: 3.15 MIL/uL — ABNORMAL LOW (ref 3.87–5.11)
RDW: 14.4 % (ref 11.5–15.5)
WBC: 11 K/uL — ABNORMAL HIGH (ref 4.0–10.5)
nRBC: 0 % (ref 0.0–0.2)

## 2023-12-30 LAB — I-STAT CHEM 8, ED
BUN: 33 mg/dL — ABNORMAL HIGH (ref 8–23)
Calcium, Ion: 1.27 mmol/L (ref 1.15–1.40)
Chloride: 99 mmol/L (ref 98–111)
Creatinine, Ser: 3.3 mg/dL — ABNORMAL HIGH (ref 0.44–1.00)
Glucose, Bld: 166 mg/dL — ABNORMAL HIGH (ref 70–99)
HCT: 30 % — ABNORMAL LOW (ref 36.0–46.0)
Hemoglobin: 10.2 g/dL — ABNORMAL LOW (ref 12.0–15.0)
Potassium: 3.7 mmol/L (ref 3.5–5.1)
Sodium: 135 mmol/L (ref 135–145)
TCO2: 26 mmol/L (ref 22–32)

## 2023-12-30 LAB — I-STAT VENOUS BLOOD GAS, ED
Acid-Base Excess: 0 mmol/L (ref 0.0–2.0)
Bicarbonate: 27.3 mmol/L (ref 20.0–28.0)
Calcium, Ion: 1.29 mmol/L (ref 1.15–1.40)
HCT: 31 % — ABNORMAL LOW (ref 36.0–46.0)
Hemoglobin: 10.5 g/dL — ABNORMAL LOW (ref 12.0–15.0)
O2 Saturation: 67 %
Potassium: 3.9 mmol/L (ref 3.5–5.1)
Sodium: 135 mmol/L (ref 135–145)
TCO2: 29 mmol/L (ref 22–32)
pCO2, Ven: 54.3 mmHg (ref 44–60)
pH, Ven: 7.309 (ref 7.25–7.43)
pO2, Ven: 39 mmHg (ref 32–45)

## 2023-12-30 LAB — RESP PANEL BY RT-PCR (RSV, FLU A&B, COVID)  RVPGX2
Influenza A by PCR: NEGATIVE
Influenza B by PCR: NEGATIVE
Resp Syncytial Virus by PCR: POSITIVE — AB
SARS Coronavirus 2 by RT PCR: NEGATIVE

## 2023-12-30 LAB — BASIC METABOLIC PANEL WITH GFR
Anion gap: 10 (ref 5–15)
BUN: 36 mg/dL — ABNORMAL HIGH (ref 8–23)
CO2: 26 mmol/L (ref 22–32)
Calcium: 9.3 mg/dL (ref 8.9–10.3)
Chloride: 99 mmol/L (ref 98–111)
Creatinine, Ser: 3.01 mg/dL — ABNORMAL HIGH (ref 0.44–1.00)
GFR, Estimated: 15 mL/min — ABNORMAL LOW (ref >60)
Glucose, Bld: 170 mg/dL — ABNORMAL HIGH (ref 70–99)
Potassium: 3.7 mmol/L (ref 3.5–5.1)
Sodium: 135 mmol/L (ref 135–145)

## 2023-12-30 LAB — TROPONIN I (HIGH SENSITIVITY)
Troponin I (High Sensitivity): 25 ng/L — ABNORMAL HIGH (ref <18)
Troponin I (High Sensitivity): 24 ng/L — ABNORMAL HIGH (ref <18)

## 2023-12-30 LAB — BRAIN NATRIURETIC PEPTIDE: B Natriuretic Peptide: 660.8 pg/mL — ABNORMAL HIGH (ref 0.0–100.0)

## 2023-12-30 LAB — I-STAT CG4 LACTIC ACID, ED
Lactic Acid, Venous: 0.7 mmol/L (ref 0.5–1.9)
Lactic Acid, Venous: 0.4 mmol/L — ABNORMAL LOW (ref 0.5–1.9)

## 2023-12-30 MED ORDER — IPRATROPIUM-ALBUTEROL 0.5-2.5 (3) MG/3ML IN SOLN
3.0000 mL | RESPIRATORY_TRACT | Status: DC
  Administered 2023-12-30 – 2024-01-01 (×8): 3 mL via RESPIRATORY_TRACT
  Filled 2023-12-30 (×9): qty 3

## 2023-12-30 MED ORDER — DIVALPROEX SODIUM 250 MG PO DR TAB
250.0000 mg | DELAYED_RELEASE_TABLET | Freq: Two times a day (BID) | ORAL | Status: DC
  Administered 2023-12-30 – 2024-01-03 (×8): 250 mg via ORAL
  Filled 2023-12-30 (×9): qty 1

## 2023-12-30 MED ORDER — PRAVASTATIN SODIUM 40 MG PO TABS
40.0000 mg | ORAL_TABLET | Freq: Every day | ORAL | Status: DC
  Administered 2023-12-30 – 2024-01-02 (×4): 40 mg via ORAL
  Filled 2023-12-30 (×4): qty 1

## 2023-12-30 MED ORDER — METOPROLOL TARTRATE 100 MG PO TABS
100.0000 mg | ORAL_TABLET | Freq: Three times a day (TID) | ORAL | Status: DC
  Administered 2023-12-30 – 2024-01-03 (×11): 100 mg via ORAL
  Filled 2023-12-30: qty 4
  Filled 2023-12-30 (×7): qty 1
  Filled 2023-12-30: qty 4
  Filled 2023-12-30: qty 1
  Filled 2023-12-30: qty 4
  Filled 2023-12-30: qty 1

## 2023-12-30 MED ORDER — IPRATROPIUM-ALBUTEROL 0.5-2.5 (3) MG/3ML IN SOLN
3.0000 mL | Freq: Once | RESPIRATORY_TRACT | Status: DC
  Filled 2023-12-30: qty 3

## 2023-12-30 MED ORDER — LINAGLIPTIN 5 MG PO TABS
5.0000 mg | ORAL_TABLET | Freq: Every day | ORAL | Status: DC
  Administered 2023-12-31 – 2024-01-03 (×4): 5 mg via ORAL
  Filled 2023-12-30 (×5): qty 1

## 2023-12-30 MED ORDER — IPRATROPIUM-ALBUTEROL 0.5-2.5 (3) MG/3ML IN SOLN: 3.0000 mL | Freq: Once | RESPIRATORY_TRACT | Status: DC

## 2023-12-30 MED ORDER — FUROSEMIDE 10 MG/ML IJ SOLN
40.0000 mg | Freq: Once | INTRAMUSCULAR | Status: AC
  Administered 2023-12-30: 16:00:00 40 mg via INTRAVENOUS
  Filled 2023-12-30: qty 4

## 2023-12-30 MED ORDER — ALLOPURINOL 100 MG PO TABS
50.0000 mg | ORAL_TABLET | Freq: Every morning | ORAL | Status: DC
  Administered 2023-12-31 – 2024-01-03 (×4): 50 mg via ORAL
  Filled 2023-12-30 (×4): qty 1

## 2023-12-30 MED ORDER — OXYCODONE HCL 5 MG PO TABS: 2.5000 mg | ORAL_TABLET | Freq: Two times a day (BID) | ORAL | Status: DC | PRN

## 2023-12-30 MED ORDER — METHYLPREDNISOLONE SODIUM SUCC 125 MG IJ SOLR
125.0000 mg | Freq: Once | INTRAMUSCULAR | Status: AC
  Administered 2023-12-30: 16:00:00 125 mg via INTRAVENOUS
  Filled 2023-12-30: qty 2

## 2023-12-30 MED ORDER — APIXABAN 2.5 MG PO TABS
2.5000 mg | ORAL_TABLET | Freq: Two times a day (BID) | ORAL | Status: DC
  Administered 2023-12-30 – 2024-01-03 (×8): 2.5 mg via ORAL
  Filled 2023-12-30 (×9): qty 1

## 2023-12-30 MED ORDER — BUDESON-GLYCOPYRROL-FORMOTEROL 160-9-4.8 MCG/ACT IN AERO
2.0000 | INHALATION_SPRAY | Freq: Two times a day (BID) | RESPIRATORY_TRACT | Status: DC
  Administered 2024-01-01 – 2024-01-03 (×5): 2 via RESPIRATORY_TRACT
  Filled 2023-12-30 (×2): qty 5.9

## 2023-12-30 MED ORDER — AMLODIPINE BESYLATE 10 MG PO TABS
10.0000 mg | ORAL_TABLET | Freq: Every day | ORAL | Status: DC
  Administered 2023-12-30 – 2024-01-02 (×4): 10 mg via ORAL
  Filled 2023-12-30 (×3): qty 1
  Filled 2023-12-30: qty 2

## 2023-12-30 MED ORDER — DICLOFENAC SODIUM 1 % EX GEL: 4.0000 g | Freq: Four times a day (QID) | CUTANEOUS | Status: DC | PRN

## 2023-12-30 MED ORDER — PREDNISONE 20 MG PO TABS
40.0000 mg | ORAL_TABLET | Freq: Every day | ORAL | Status: DC
  Filled 2023-12-30: qty 2

## 2023-12-30 MED ORDER — PENICILLIN G POTASSIUM IV (FOR PTA / DISCHARGE USE ONLY): 12.0000 10*6.[IU] | INTRAVENOUS | Status: DC

## 2023-12-30 MED ORDER — MAGNESIUM SULFATE 2 GM/50ML IV SOLN
2.0000 g | Freq: Once | INTRAVENOUS | Status: AC
  Administered 2023-12-30: 23:00:00 2 g via INTRAVENOUS
  Filled 2023-12-30: qty 50

## 2023-12-30 MED ORDER — ALBUTEROL SULFATE (2.5 MG/3ML) 0.083% IN NEBU
7.5000 mg/h | INHALATION_SOLUTION | Freq: Once | RESPIRATORY_TRACT | Status: AC
  Administered 2023-12-30: 16:00:00 7.5 mg/h via RESPIRATORY_TRACT
  Filled 2023-12-30: qty 9

## 2023-12-30 MED ORDER — ACETAMINOPHEN 325 MG PO TABS
650.0000 mg | ORAL_TABLET | Freq: Four times a day (QID) | ORAL | Status: DC | PRN
  Administered 2023-12-31 – 2024-01-02 (×2): 650 mg via ORAL
  Filled 2023-12-30 (×2): qty 2

## 2023-12-30 MED ORDER — PENICILLIN G POTASSIUM 20000000 UNITS IJ SOLR
12.0000 10*6.[IU] | INTRAVENOUS | Status: DC
  Administered 2023-12-30 – 2024-01-02 (×5): 12 10*6.[IU] via INTRAVENOUS
  Filled 2023-12-30 (×6): qty 12

## 2023-12-30 MED ORDER — OLANZAPINE 5 MG PO TABS
15.0000 mg | ORAL_TABLET | Freq: Every day | ORAL | Status: DC
  Administered 2023-12-30 – 2024-01-02 (×4): 15 mg via ORAL
  Filled 2023-12-30 (×2): qty 1
  Filled 2023-12-30: qty 2
  Filled 2023-12-30 (×2): qty 1

## 2023-12-30 MED ORDER — ZINC OXIDE 40 % EX OINT
1.0000 | TOPICAL_OINTMENT | Freq: Two times a day (BID) | CUTANEOUS | Status: DC
  Administered 2024-01-01 – 2024-01-03 (×5): 1 via TOPICAL
  Filled 2023-12-30 (×2): qty 57

## 2023-12-30 MED ORDER — ACETAMINOPHEN 650 MG RE SUPP: 650.0000 mg | Freq: Four times a day (QID) | RECTAL | Status: DC | PRN

## 2023-12-30 MED ORDER — NYSTATIN 100000 UNIT/GM EX CREA
1.0000 | TOPICAL_CREAM | Freq: Two times a day (BID) | CUTANEOUS | Status: DC
  Administered 2024-01-01 – 2024-01-03 (×4): 1 via TOPICAL
  Filled 2023-12-30 (×2): qty 30

## 2023-12-30 MED ORDER — SODIUM BICARBONATE 650 MG PO TABS
650.0000 mg | ORAL_TABLET | Freq: Three times a day (TID) | ORAL | Status: DC
  Administered 2023-12-30 – 2024-01-03 (×11): 650 mg via ORAL
  Filled 2023-12-30 (×12): qty 1

## 2023-12-30 NOTE — Plan of Care (Signed)
 FMTS Interim Progress Note  S: Saw patient at bedside, she reports that she is feeling a little better than earlier today.  Was resting comfortably in bed and expressed that she is having strong feelings of anxiety for her grandchildren that they are using drugs.  Additionally she expressed frustration for not feeling like she can get back to her usual healthy self.  Reports her shortness of breath is feeling little better, denies any chest pain, chronic knee pain at baseline.  O: BP (!) 134/53   Pulse 72   Temp 97.9 F (36.6 C) (Oral)   Resp 20   SpO2 97%   General: Obese, well-appearing, no acute distress, lying left lateral decubitus position Cardiovascular: Distant heart sounds, regular rate rhythm, no murmurs rubs or gallops Pulmonary: Diffuse expiratory wheezes, mild inspiratory crackles left lower lobe, on 2.5 L LFNC Abdomen: Obese abdomen with large umbilical hernia, soft, nontender to light or deep palpation, no guarding Extremities: Trace 1+ pitting edema bilateral ankles  A/P: CHF exacerbation, CXR with prominent vascular congestion, possible left lower lobe pneumonia, not well-visualized no lateral imaging, though unlikely given extensive IV antibiotic course for bacteremia. - RPP pending - troponin pending - Will continue to monitor I's and O's - Daily weights starting tomorrow - S/p furosemide  40 mg IV, consider continuing 40 mg IV twice daily tomorrow - Follow-up with son [name] regarding home Lasix  dosing, some concern she was underdosing Lasix  changed with discharge last admission. - On baseline oxygen  requirement  Acute COPD exacerbation -Pending scheduled DuoNeb every 4 hours, ED very busy with running code trauma -Continue Breztri  2 puffs twice daily -S/p IV Solu-Medrol , transition to p.o. prednisone  burst for total of 5 days tomorrow morning  AKI Baseline creatinine around 2.75, presenting with creatinine 3.30.  Suspect primarily prerenal from acute CHF exacerbation  given clinical hypervolemia.  Restarted home meds  Miguelina Fore, MD 12/30/2023, 7:25 PM PGY-1, [facility] Family Medicine Service pager [phone]

## 2023-12-30 NOTE — ED Triage Notes (Signed)
 Patient was told by home health nurse this morning and was told she had PNA and to come to [facility] for treatment. Wears 3L O2 at home. Denies complaints.

## 2023-12-30 NOTE — Assessment & Plan Note (Addendum)
 On chronic Penicillin  G daily infusion for infected pacemaker. Will have night team confirm this is ordered correctly.

## 2023-12-30 NOTE — Telephone Encounter (Signed)
 Crystal RN case production designer, theatre/television/film with [name] LVM on nurse line in regards to patient.   She reports they are sending patient via ambulance to [facility] ED.  She reports the patient is significantly short of breath despite continuous 2L O2.  She reports crackles in both lungs.   Will forward to PCP to make aware.

## 2023-12-30 NOTE — Transitions of Care (Post Inpatient/ED Visit) (Signed)
 12/30/2023  Name: Abigail [facility] MRN: [id number] DOB: July 21, 1945  Today's TOC FU Call Status: Today's TOC FU Call Status:: Successful TOC FU Call Completed TOC FU Call Complete Date: 12/30/23  Patient's Name and Date of Birth confirmed. Name, DOB  Transition Care Management Follow-up Telephone Call Date of Discharge: 12/27/23 Discharge Facility: [name]) Type of Discharge: Inpatient Admission Primary Inpatient Discharge Diagnosis:: Streptococcal bacteremia How have you been since you were released from the hospital?: Same (Her O2 sats are running 91% on 2 liters. She is not any better.) Any questions or concerns?: Yes Patient Questions/Concerns:: Patientson has put a call in to PCP. Patient is not any better Patient Questions/Concerns Addressed: Other:  Items Reviewed: Did you receive and understand the discharge instructions provided?: Yes Medications obtained,verified, and reconciled?: Yes (Medications Reviewed) Dietary orders reviewed?: No Do you have support at home?: Yes People in Home [RPT]: child(ren), adult Name of Support/Comfort Primary Source: Son [name] and a caregiver  Medications Reviewed Today: Medications Reviewed Today     Reviewed by Kennieth Cathlean DEL, RN (Case Manager) on 12/30/23 at 1110  Med List Status: <None>   Medication Order Taking? Sig Documenting Provider Last Dose Status Informant  acetaminophen  (TYLENOL ) 500 MG tablet [id number] Yes Take 500-1,000 mg by mouth every 6 (six) hours as needed for mild pain (pain score 1-3) or headache. [provider]  Active Child, Pharmacy Records  albuterol  (VENTOLIN  HFA) 108 [phone] Base) MCG/ACT inhaler [id number] Yes Inhale 2 puffs into the lungs every 6 (six) hours as needed for wheezing or shortness of breath. Delores Suzann HERO, MD  Active Child, Pharmacy Records  allopurinol  (ZYLOPRIM ) 100 MG tablet [id number] Yes Take 0.5 tablets (50 mg total) by mouth every other day.  Patient taking  differently: Take 50 mg by mouth in the morning.   Delores Suzann HERO, MD  Active Child, Pharmacy Records  amLODipine  (NORVASC ) 10 MG tablet [id number] Yes Take 1 tablet (10 mg total) by mouth at bedtime.  Patient taking differently: Take 10 mg by mouth daily.   Delores Suzann HERO, MD  Active Child, Pharmacy Records  apixaban  (ELIQUIS ) 2.5 MG TABS tablet [id number] Yes Take 1 tablet (2.5 mg total) by mouth 2 (two) times daily. Delores Suzann HERO, MD  Active Child, Pharmacy Records  cholecalciferol  (VITAMIN D3) 25 MCG (1000 UNIT) tablet [id number] Yes Take 1 tablet (1,000 Units total) by mouth daily. Rumball, Alison M, DO  Active Child, Pharmacy Records  diclofenac  Sodium (VOLTAREN ) 1 % GEL [id number] Yes Apply 2 g topically 4 (four) times daily as needed (pain). Delores Suzann HERO, MD  Active Child, Pharmacy Records  divalproex  (DEPAKOTE ) 250 MG DR tablet [id number] Yes Take 1 tablet (250 mg total) by mouth every 12 (twelve) hours. Samtani, Jai-Gurmukh, MD  Active Child, Pharmacy Records  Fluticasone -Umeclidin-Vilant (TRELEGY ELLIPTA ) 100-62.5-25 MCG/ACT AEPB [id number] Yes Inhale 1 puff into the lungs daily. Delores Suzann HERO, MD  Active Child, Pharmacy Records  furosemide  (LASIX ) 40 MG tablet [id number] Yes Take 2 tablets (80 mg total) by mouth daily. Baloch, Mahnoor, MD  Active   glucose blood (ACCU-CHEK GUIDE TEST) test strip [id number] Yes Use to check blood glucose once daily and as needed for symptoms Delores Suzann HERO, MD  Active Child, Pharmacy Records  ipratropium-albuterol  (DUONEB) 0.5-2.5 (3) MG/3ML SOLN [id number] Yes Take 3 mLs by nebulization every 4 (four) hours as needed.  Patient taking differently: Take 3 mLs by nebulization See admin instructions. Nebulize 3 ml's and inhale into the lungs in  the morning and evening- and an additional 3 ml's four times a day as needed for shortness of breath or wheezing   Delores Suzann HERO, MD  Active Child, Pharmacy Records  Lancets (FREESTYLE) lancets [id number] Yes Use to  check blood glucose once daily and as needed for symptoms Delores Suzann HERO, MD  Active Child, Pharmacy Records  linagliptin  (TRADJENTA ) 5 MG TABS tablet [id number] Yes Take 1 tablet (5 mg total) by mouth daily. Baloch, Mahnoor, MD  Active   loperamide  (IMODIUM ) 2 MG capsule [id number] Yes Take 1 capsule (2 mg total) by mouth as needed for diarrhea or loose stools. Baloch, Mahnoor, MD  Active   metoprolol  tartrate (LOPRESSOR ) 100 MG tablet [id number] Yes Take 1 tablet (100 mg total) by mouth 3 (three) times daily. Delores Suzann HERO, MD  Active Child, Pharmacy Records  Multiple Vitamins-Minerals (MULTIVITAMIN WOMEN 50+) TABS [id number] Yes Take 1 tablet by mouth daily with breakfast. [provider]  Active Child, Pharmacy Records  nitroGLYCERIN  (NITROSTAT ) 0.4 MG SL tablet [id number] Yes Place 1 tablet (0.4 mg total) under the tongue every 5 (five) minutes as needed for chest pain. Croitoru, Jerel, MD  Active Child, Pharmacy Records           Med Note (WHITE, DONETA RAMAN   Fri Dec 20, 2023  6:47 AM) Not needed for chest px this month per son  nystatin  cream (MYCOSTATIN ) [id number] Yes Apply 1 Application topically daily as needed (Rash).  Patient taking differently: Apply 1 Application topically See admin instructions. Apply as directed to irritated areas of the right thigh and stomach in the morning and at bedtime   Delores Suzann HERO, MD  Active Child, Pharmacy Records  nystatin  powder [id number] Yes Apply 1 Application topically daily. Apply after bathing.  Patient taking differently: Apply 1 Application topically See admin instructions. Apply as directed to irritated areas of the right thigh and stomach in the morning and at bedtime   Delores Suzann HERO, MD  Active Child, Pharmacy Records  OLANZapine  (ZYPREXA ) 10 MG tablet [id number] Yes Take 15 mg by mouth at bedtime. [provider]  Active Child, Pharmacy Records  OVER THE COUNTER MEDICATION [id number] Yes Apply 1 application  topically See admin  instructions. Caldesene Medicated Protecting Powder- Apply under the breasts and between the skin folds 2 times a day as needed for irritation [provider]  Active Child, Pharmacy Records  oxyCODONE  (ROXICODONE ) 5 MG immediate release tablet [id number] Yes Take 0.5 tablets (2.5 mg total) by mouth every 12 (twelve) hours as needed for severe pain (pain score 7-10). Delores Suzann HERO, MD  Active Child, Pharmacy Records  penicillin  KANDICE SPORTSMAN [id number] Yes Inject 12 Million Units into the vein daily. Indication:  Streptococcal mitis/oralis bacteremia First Dose: Yes Last Day of Therapy:  02/01/2024 Labs - Once weekly:  CBC/D and BMP, Labs - Once weekly: ESR and CRP Infuse 12 million units IV daily as a continuous infusion Method of administration: Elastomeric (Continuous infusion) Method of administration may be changed at the discretion of home infusion pharmacist based upon assessment of the patient and/or caregiver's ability to self-administer the medication ordered. Manandhar, Sabina, MD  Active   polycarbophil (FIBERCON) 625 MG tablet [id number] Yes Take 1 tablet (625 mg total) by mouth daily. Baloch, Mahnoor, MD  Active   polyethylene glycol powder (GLYCOLAX /MIRALAX ) 17 GM/SCOOP powder [id number]  Take 17 g by mouth 3 (three) times daily. Delores Suzann HERO, MD  Active Child, Pharmacy Records  pravastatin  (PRAVACHOL ) 40 MG tablet [id number]  Yes Take 1 tablet (40 mg total) by mouth every evening.  Patient taking differently: Take 40 mg by mouth at bedtime.   Croitoru, Mihai, MD  Active Child, Pharmacy Records  senna (SENOKOT) 8.6 MG TABS tablet [id number]  Take 1 tablet (8.6 mg total) by mouth daily as needed for mild constipation.  Patient taking differently: Take 1 tablet by mouth daily.   Delores Suzann HERO, MD  Active Child, Pharmacy Records  sodium bicarbonate  650 MG tablet [id number] Yes Take 1 tablet (650 mg total) by mouth 3 (three) times daily. Delores Suzann HERO, MD  Active Child, Pharmacy  Records  triamcinolone  ointment (KENALOG ) 0.5 % [id number] Yes Apply 1 Application topically 2 (two) times daily as needed (skin irritation). Delores Suzann HERO, MD  Active Child, Pharmacy Records  Med List Note Allegra Deatrice FERNS, CPhT 03/15/21 1616): [name] Arenas (Son) at [phone] [facility]) helps with medications.            Home Care and Equipment/Supplies: Were Home Health Services Ordered?: Yes Name of Home Health Agency:: [name] Has Agency set up a time to come to your home?: Yes First Home Health Visit Date: 12/27/23 Any new equipment or medical supplies ordered?: NA  Functional Questionnaire: Do you need assistance with bathing/showering or dressing?: Yes Do you need assistance with meal preparation?: Yes Do you need assistance with eating?: No Do you have difficulty maintaining continence: No Do you need assistance with getting out of bed/getting out of a chair/moving?: Yes Do you have difficulty managing or taking your medications?: Yes  Follow up appointments reviewed: PCP Follow-up appointment confirmed?: Yes Date of PCP follow-up appointment?: 01/14/24 Follow-up Provider: Dr Suzann Delores Home visit Specialist [facility] Follow-up appointment confirmed?: NA Do you need transportation to your follow-up appointment?: No Do you understand care options if your condition(s) worsen?: Yes-patient verbalized understanding  SDOH Interventions Today    Flowsheet Row Most Recent Value  SDOH Interventions   Food Insecurity Interventions Intervention Not Indicated  Housing Interventions Intervention Not Indicated  Transportation Interventions Inpatient TOC, [facility])  Utilities Interventions Intervention Not Indicated   Patient son requested  a list of Hospice agencies available emailed to him.  RN sent list. Discussed and offered 30 day TOC program.  Patient son declined.  The patient son has been provided with contact  information for the care management team and has been advised to call with any health -related questions or concerns.  The patient verbalized understanding with current plan of care.  The patient is directed to their insurance card regarding availability of benefits coverage     Cathlean Headland BSN RN [facility] Care Management Coordinator [email or web address] Direct Dial: [phone]  Fax: [phone] Website: [email or web address]

## 2023-12-30 NOTE — ED Notes (Signed)
 Patient given a fan and a blanket.

## 2023-12-30 NOTE — H&P (Cosign Needed Addendum)
 [facility] Admission History and Physical Service Pager: [phone]  Patient name: Abigail Wallace Medical record number: [id number] Date of Birth: 12-28-1945 Age: 78 y.o. Gender: female  Primary Care Provider: Suzann Daring MD Consultants: none Code Status: FULL Preferred Emergency Contact:  Contact Information     Name Relation Home Work [place] Son [phone]  [phone]      Other Contacts     Name Relation Home Work Mobile   [name],[name] Son [phone]  [phone]        Chief Complaint: SOB  Differential and Medical Decision Making:  Abigail [facility] is a 78 y.o. female presenting with acute hypoxic respiratory failure. Differential includes CHF exacerbation, COPD exacerbation, ACS, PE. CHF is likely due to elevated BNP, vascular congestion on CXR, and fluid overload on exam. COPD also considered given pronounced wheezing on exam. She likely has a combination of both CHF and COPD contributing to her current presentation, will treat for both. ACS and PE less likely given normal HR, chronic anticoag, normal trops.  Assessment & Plan Acute hypoxic respiratory failure (HCC) COPD exacerbation (HCC) CHF exacerbation (HCC) - Admit to Med Tele w/ attending Dr. Donzetta on FMTS - s/p lasix  40 IV for CHF. Redose in AM.  - s/p solumedrol 125mg  IV for COPD exac  - Transition to PO Prednisone  40mg  daily tomorrow (tentative 5 day course) - Duonebs q4h sch. Make prn if wheezing improving. - Cont 3L [place], wean as tolerated.   - Goal O2 88-92 - Cont home breztri  - Cardiac monitoring - f/u trop to ensure it flattens - f/u RPP, Bcx - Strict I/Os, daily weights - AM Mg, BMP, CBC Acute-on-chronic kidney injury Cr 3.3 (baseline ~2.7). Suspect pre-renal 2/2 CHF. At last DC, Cr was responding well to diuresis.  - diuresis as above - CTM Cr Chronic health problem Night team will assess pt and restart home meds as appropriate. Infected pacemaker On  chronic Penicillin  G daily infusion for infected pacemaker. Will have night team confirm this is ordered correctly.     FEN/GI: heart healthy, carb modified VTE Prophylaxis: Home eliquis   Disposition: med tele  History of Present Illness:  Abigail Wallace is a 78 y.o. female presenting with SOB. History provided by pt and son ([name] over phone). Had worsening SOB since discharge. She was recently admitted for strep mitis/oralis bacteremia and was discharged on chronic penicillin  G infusions for lifelong supression for her pacemaker. Son feels like she was wheezy and coughing when she was discharged. She was discharged on 2L [place] (normally on RA at baseline), but reports that she had needed to increase to 3L to maintain sats in 90s. Son reports she has been getting duonebs at home. Reports that she has been getting all her meds as prescribed, including infusions. Denies fevers.  Got morning meds: - lasix  80 - allopurinol  - depakoate - trugenta - sodium bicarb - eliquis  - metoprolol   ED course: On admission, VSS on 3L [place].  Labs notable for elevated BNP and chest x-ray showed cardiomegaly with vascular congestion.  Per ED provider, patient was also very wheezy.  Patient was given Solu-Medrol , albuterol , DuoNebs, and Lasix .    Pertinent Past Medical History: A-fib on Eliquis  CHF CKD 3B COPD CAD GERD Gout HTN Memory difficulty is Pacemaker for sick sinus syndrome Paranoid schizophrenia T2DM Chronic antibiotics for strep mitis/oralis bacteremia and infected pacemaker  Remainder reviewed in history tab.   Pertinent Past Surgical History: Cardiac cath  2007 Pacemaker placement 2007 Cholecystectomy Remainder reviewed in history tab.  Pertinent Social History: Lives with son [name] Pertinent Family History: Mother: Heart disease Father: Heart disease Sister: Stroke Son: T2DM.  SABRA   Important Outpatient Medications: Albuterol  as needed Allopurinol  50 mg every other  day Amlodipine  10 mg daily Eliquis  2.5 mg twice daily Depakote  250 mg twice daily Trelegy 1 puff daily Lasix  60 daily Metoprolol  tartrate 100 3 times daily Olanzapine  10 mg nightly Pravastatin  40 daily Ozempic  0.5 mg weekly Sodium bicarb 650 mg 3 times dail   Objective: BP (!) 134/53   Pulse 72   Temp 98.4 F (36.9 C)   Resp 20   SpO2 97%  Exam: General: Alert, chronically ill appearing older woman laying in bed. NAD. HEENT: NCAT. MMM Resp: Diffuse expiratory wheezing in all lung fields.  Inspiratory crackles in lower lung fields bilaterally.  Normal work of breathing on 3 L nasal cannula. CV: RRR Abm: Soft, nontender, nondistended.  Ext: 1+ pitting edema in BL legs  Labs:  CBC BMET  Recent Labs  Lab 12/30/23 1614 12/30/23 1622 12/30/23 1751  WBC 11.0*  --   --   HGB 9.3*   < > 10.5*  HCT 30.2*   < > 31.0*  PLT 168  --   --    < > = values in this interval not displayed.   Recent Labs  Lab 12/30/23 1614 12/30/23 1622 12/30/23 1751  NA 135 135 135  K 3.7 3.7 3.9  CL 99 99  --   CO2 26  --   --   BUN 36* 33*  --   CREATININE 3.01* 3.30*  --   GLUCOSE 170* 166*  --   CALCIUM  9.3  --   --         Imaging Studies Performed: CXR Cardiomegaly with vascular congestion.    Elicia Hamlet, MD 12/30/2023, 6:01 PM PGY-2, [facility]  FPTS Intern pager: [phone], text pages welcome Secure chat group [facility] Teaching Service

## 2023-12-30 NOTE — Assessment & Plan Note (Addendum)
-   Admit to Med Tele w/ attending Dr. Donzetta on FMTS - s/p lasix  40 IV for CHF. Redose in AM.  - s/p solumedrol 125mg  IV for COPD exac  - Transition to PO Prednisone  40mg  daily tomorrow (tentative 5 day course) - Duonebs q4h sch. Make prn if wheezing improving. - Cont 3L [place], wean as tolerated.   - Goal O2 88-92 - Cont home breztri  - Cardiac monitoring - f/u trop to ensure it flattens - f/u RPP, Bcx - Strict I/Os, daily weights - AM Mg, BMP, CBC

## 2023-12-30 NOTE — Telephone Encounter (Signed)
 Patient's son also called and LVM on nurse line requesting returned call.   Called [name] at number provided.   He reports that she did really well on Friday and Saturday maintaining her O2 levels. Sunday around 0200 he noticed that her O2 levels started to decline. O2 levels are 93% on 2 L nasal cannula. Lowest O2 level has been 89. He reports that breathing is very labored. She is not complaining of chest pain. Not really seeing any swelling. States that when she tries to have conversation, she is not able to speak in sentences without gasping.   Feels that Saturday night everything changed. Temperature has bee fluctuating between 98.7 to low 99's.   Advised [name] that due to respiratory concerns, patient needs to go back to the ED. Pt has been adamantly refusing to go to ED. He is going to talk with mother again in regards to going back to ED for respiratory concerns.   He is also requesting that PCP call him back when she is available. I advised that I would send message to Dr. Delores. Recommended that due to respiratory concerns, that he not wait until returned call to receive evaluation.   [name], RN

## 2023-12-30 NOTE — ED Provider Triage Note (Signed)
 Emergency Medicine Provider Triage Evaluation Note  Abigail Wallace [facility] , a 78 y.o. female  was evaluated in triage.  Pt complains of shortness of breath.  The patient does have a history of COPD.  Was recently mated earlier this month for strep bacteremia.  Is currently on antibiotics through a PICC line.  She was having increased oxygen  requirement and was sent in today for further evaluation regarding this.  Patient does report wheezing.  Review of Systems  Positive: See above Negative: Chest pain  Physical Exam  BP (!) 158/51   Pulse 70   Temp 98.4 F (36.9 C)   Resp 20   SpO2 92%  Gen:   Awake, no distress, conversational dyspnea noted Resp:  Mildly tachypneic, wheezing noted MSK:   Moves extremities without difficulty  Other:  No abdominal tenderness  Medical Decision Making  Medically screening exam initiated at 2:37 PM.  Appropriate orders placed.  [name] was informed that the remainder of the evaluation will be completed by another provider, this initial triage assessment does not replace that evaluation, and the importance of remaining in the ED until their evaluation is complete.  Patient's vital signs other than hypoxia are reassuring.  Will obtain x-ray to evaluate for pneumonia, pulmonary edema, pulmonary infiltrates, or pneumothorax.  She is wheezing on exam.  Will give Solu-Medrol  as well as DuoNebs.  Blood cultures are ordered given history but will hold off on antibiotics at this time as this very well could be due to COPD exacerbation or CHF with no complaints of fever or really symptoms of any kind here.   [name], MD 12/30/23 [phone]

## 2023-12-30 NOTE — Telephone Encounter (Signed)
 Called son. He reports that her dyspnea has worsened significantly. Patient on the way ED.   Suzann Daring, MD  Family Medicine Teaching Service

## 2023-12-30 NOTE — ED Provider Notes (Signed)
 [facility] AT [facility] Provider Note   CSN: [id number] Arrival date & time: 12/30/23  1421    Patient presents with: Shortness of breath   Abigail Wallace is a 78 y.o. female here for evaluation shortness of breath.  She has extensive past medical history.  Recently admitted for bacteremia has PICC line to right upper chest.  Getting IV penicillin  at home.  Lives with son.  Was doing well up until Saturday when she started feeling progressively more short of breath.  Cough productive of yellow sputum.  Typically wears 2 L of oxygen  at baseline, has had to go up to 3 L via nasal cannula and still dropping into the 80s.  She has had chills without documented fever.  She has a Foley catheter for recent UTI.  She denies any pain.  She feels like she is wheezing.  Doing home breathing treatments without relief- 3-4 xdaily nebs and albuterol  puffer.  She denies any leg pain or leg swelling.  States that home health came out today and told her she had pneumonia.  No headache, nausea, vomiting, chest pain, abdominal pain.  She states she is compliant with her anticoagulation which she is on for atrial fibrillation.  She states she has had good urine output.  Son trying to keep her within her fluid intake however did have more than her 1300cc max on 1 day.  Per nursing EMS gave neb PTA.   HPI     Prior to Admission medications  Medication Sig Start Date End Date Taking? Authorizing Provider  acetaminophen  (TYLENOL ) 500 MG tablet Take 500-1,000 mg by mouth every 6 (six) hours as needed for mild pain (pain score 1-3) or headache.    [provider]  albuterol  (VENTOLIN  HFA) 108 (90 Base) MCG/ACT inhaler Inhale 2 puffs into the lungs every 6 (six) hours as needed for wheezing or shortness of breath. 11/11/23   Delores Suzann HERO, MD  allopurinol  (ZYLOPRIM ) 100 MG tablet Take 0.5 tablets (50 mg total) by mouth every other day. Patient taking differently:  Take 50 mg by mouth in the morning. 09/19/23   Delores Suzann HERO, MD  amLODipine  (NORVASC ) 10 MG tablet Take 1 tablet (10 mg total) by mouth at bedtime. Patient taking differently: Take 10 mg by mouth daily. 10/11/23   Delores Suzann HERO, MD  apixaban  (ELIQUIS ) 2.5 MG TABS tablet Take 1 tablet (2.5 mg total) by mouth 2 (two) times daily. 12/11/22   Delores Suzann HERO, MD  cholecalciferol  (VITAMIN D3) 25 MCG (1000 UNIT) tablet Take 1 tablet (1,000 Units total) by mouth daily. 08/12/23   Rumball, Alison M, DO  diclofenac  Sodium (VOLTAREN ) 1 % GEL Apply 2 g topically 4 (four) times daily as needed (pain). 08/19/23   Delores Suzann HERO, MD  divalproex  (DEPAKOTE ) 250 MG DR tablet Take 1 tablet (250 mg total) by mouth every 12 (twelve) hours. 03/05/22   Samtani, Jai-Gurmukh, MD  Fluticasone -Umeclidin-Vilant (TRELEGY ELLIPTA ) 100-62.5-25 MCG/ACT AEPB Inhale 1 puff into the lungs daily. 11/11/23   Delores Suzann HERO, MD  furosemide  (LASIX ) 40 MG tablet Take 2 tablets (80 mg total) by mouth daily. 12/27/23 12/26/24  Baloch, Mahnoor, MD  glucose blood (ACCU-CHEK GUIDE TEST) test strip Use to check blood glucose once daily and as needed for symptoms 11/08/23   Delores Suzann HERO, MD  ipratropium-albuterol  (DUONEB) 0.5-2.5 (3) MG/3ML SOLN Take 3 mLs by nebulization every 4 (four) hours as needed. Patient taking differently: Take 3 mLs by  nebulization See admin instructions. Nebulize 3 ml's and inhale into the lungs in the morning and evening- and an additional 3 ml's four times a day as needed for shortness of breath or wheezing 11/11/23   Delores Suzann HERO, MD  Lancets (FREESTYLE) lancets Use to check blood glucose once daily and as needed for symptoms 11/08/23   Delores Suzann HERO, MD  linagliptin  (TRADJENTA ) 5 MG TABS tablet Take 1 tablet (5 mg total) by mouth daily. 12/27/23   Baloch, Mahnoor, MD  loperamide  (IMODIUM ) 2 MG capsule Take 1 capsule (2 mg total) by mouth as needed for diarrhea or loose stools. 12/27/23   Baloch, Mahnoor, MD   metoprolol  tartrate (LOPRESSOR ) 100 MG tablet Take 1 tablet (100 mg total) by mouth 3 (three) times daily. 09/04/23   Delores Suzann HERO, MD  Multiple Vitamins-Minerals (MULTIVITAMIN WOMEN 50+) TABS Take 1 tablet by mouth daily with breakfast.    [provider]  nitroGLYCERIN  (NITROSTAT ) 0.4 MG SL tablet Place 1 tablet (0.4 mg total) under the tongue every 5 (five) minutes as needed for chest pain. 08/03/22 01/15/24  Croitoru, Mihai, MD  nystatin  cream (MYCOSTATIN ) Apply 1 Application topically daily as needed (Rash). Patient taking differently: Apply 1 Application topically See admin instructions. Apply as directed to irritated areas of the right thigh and stomach in the morning and at bedtime 11/21/23   Delores Suzann HERO, MD  nystatin  powder Apply 1 Application topically daily. Apply after bathing. Patient taking differently: Apply 1 Application topically See admin instructions. Apply as directed to irritated areas of the right thigh and stomach in the morning and at bedtime 10/04/23   Delores Suzann HERO, MD  OLANZapine  (ZYPREXA ) 10 MG tablet Take 15 mg by mouth at bedtime. 05/17/23   [provider]  OVER THE COUNTER MEDICATION Apply 1 application  topically See admin instructions. Caldesene Medicated Protecting Powder- Apply under the breasts and between the skin folds 2 times a day as needed for irritation    [provider]  oxyCODONE  (ROXICODONE ) 5 MG immediate release tablet Take 0.5 tablets (2.5 mg total) by mouth every 12 (twelve) hours as needed for severe pain (pain score 7-10). 12/19/23   Delores Suzann HERO, MD  penicillin  G IVPB Inject 12 Million Units into the vein daily. Indication:  Streptococcal mitis/oralis bacteremia First Dose: Yes Last Day of Therapy:  02/01/2024 Labs - Once weekly:  CBC/D and BMP, Labs - Once weekly: ESR and CRP Infuse 12 million units IV daily as a continuous infusion Method of administration: Elastomeric (Continuous infusion) Method of  administration may be changed at the discretion of home infusion pharmacist based upon assessment of the patient and/or caregiver's ability to self-administer the medication ordered. 12/25/23 02/01/24  Manandhar, Sabina, MD  polycarbophil (FIBERCON) 625 MG tablet Take 1 tablet (625 mg total) by mouth daily. 12/27/23   Baloch, Mahnoor, MD  [Paused] polyethylene glycol powder (GLYCOLAX /MIRALAX ) 17 GM/SCOOP powder Take 17 g by mouth 3 (three) times daily. Wait to take this until your doctor or other care provider tells you to start again. 12/11/22   Delores Suzann HERO, MD  pravastatin  (PRAVACHOL ) 40 MG tablet Take 1 tablet (40 mg total) by mouth every evening. Patient taking differently: Take 40 mg by mouth at bedtime. 09/25/23   Croitoru, Mihai, MD  [Paused] senna (SENOKOT) 8.6 MG TABS tablet Take 1 tablet (8.6 mg total) by mouth daily as needed for mild constipation. Patient taking differently: Take 1 tablet by mouth daily. Wait to take this until  your doctor or other care provider tells you to start again. 05/16/23   Delores Suzann HERO, MD  sodium bicarbonate  650 MG tablet Take 1 tablet (650 mg total) by mouth 3 (three) times daily. 12/05/23   Delores Suzann HERO, MD  triamcinolone  ointment (KENALOG ) 0.5 % Apply 1 Application topically 2 (two) times daily as needed (skin irritation). 10/04/23   Delores Suzann HERO, MD    Allergies: Abilify [aripiprazole], Keflex  [cephalexin ], Versacloz  [clozapine ], Ativan  [lorazepam ], Benadryl [diphenhydramine hcl], Benztropine , Latuda [lurasidone hcl], Remeron [mirtazapine], Haldol  [haloperidol  lactate], Codeine, and Latex    Review of Systems  Constitutional:  Positive for activity change, chills and fatigue. Negative for appetite change, diaphoresis, fever and unexpected weight change.  HENT: Negative.    Respiratory:  Positive for cough, shortness of breath and wheezing.   Cardiovascular: Negative.   Gastrointestinal: Negative.   Genitourinary: Negative.   Musculoskeletal:  Negative.   Skin: Negative.   Neurological: Negative.   All other systems reviewed and are negative.   Updated Vital Signs BP (!) 158/51   Pulse 70   Temp 98.4 F (36.9 C)   Resp 20   SpO2 92%   Physical Exam Vitals and nursing note reviewed.  Constitutional:      General: She is in acute distress.     Appearance: She is well-developed. She is obese. She is ill-appearing. She is not toxic-appearing or diaphoretic.  HENT:     Head: Normocephalic and atraumatic.     Nose: Congestion and rhinorrhea present.     Mouth/Throat:     Mouth: Mucous membranes are moist.  Eyes:     Pupils: Pupils are equal, round, and reactive to light.  Cardiovascular:     Rate and Rhythm: Normal rate.     Pulses: Normal pulses.          Radial pulses are 2+ on the right side and 2+ on the left side.       Dorsalis pedis pulses are 2+ on the right side and 2+ on the left side.     Heart sounds: Normal heart sounds.  Pulmonary:     Effort: Respiratory distress present.     Breath sounds: Wheezing present.  Abdominal:     General: Bowel sounds are normal. There is no distension.     Palpations: Abdomen is soft.     Tenderness: There is no abdominal tenderness. There is no right CVA tenderness, left CVA tenderness, guarding or rebound.  Musculoskeletal:        General: No swelling, tenderness, deformity or signs of injury. Normal range of motion.     Cervical back: Normal range of motion.     Right lower leg: Edema present.     Left lower leg: Edema present.     Comments: Trace pitting edema to BLE  Skin:    General: Skin is warm and dry.     Capillary Refill: Capillary refill takes less than 2 seconds.     Comments: Scant erythema to BIL thigh creases.   Neurological:     General: No focal deficit present.     Mental Status: She is alert.     Cranial Nerves: No cranial nerve deficit.     Sensory: No sensory deficit.     Motor: No weakness.     Gait: Gait normal.  Psychiatric:        Mood  and Affect: Mood normal.     (all labs ordered are listed, but only abnormal results are displayed)  Labs Reviewed  RESP PANEL BY RT-PCR (RSV, FLU A&B, COVID)  [id number]  CULTURE, BLOOD (ROUTINE X 2)  CULTURE, BLOOD (ROUTINE X 2)  BASIC METABOLIC PANEL WITH GFR  CBC WITH DIFFERENTIAL/PLATELET  BRAIN NATRIURETIC PEPTIDE  I-STAT CG4 LACTIC ACID, ED  I-STAT CHEM 8, ED  TROPONIN I (HIGH SENSITIVITY)    EKG: None  Radiology: DG Chest 1 View Result Date: 12/30/2023 CLINICAL DATA:  Shortness of breath. EXAM: CHEST  1 VIEW COMPARISON:  Chest radiograph dated 12/20/2023. FINDINGS: Evaluation is limited due to body habitus and patient rotation. Right sided central venous line with tip in the region of the cavoatrial junction. Cardiomegaly with vascular congestion. No large pleural effusion or pneumothorax. Atherosclerotic calcification of the aorta. Left pectoral pacemaker device. No acute osseous pathology. IMPRESSION: Cardiomegaly with vascular congestion. Electronically Signed   By: Vanetta Chou M.D.   On: 12/30/2023 15:26     .Critical Care  Performed by: Edie Rosebud LABOR, PA-C Authorized by: Edie Rosebud LABOR, PA-C   Critical care provider statement:    Critical care time (minutes):  35   Critical care was necessary to treat or prevent imminent or life-threatening deterioration of the following conditions:  Respiratory failure   Critical care was time spent personally by me on the following activities:  Development of treatment plan with patient or surrogate, discussions with consultants, evaluation of patient's response to treatment, examination of patient, ordering and review of laboratory studies, ordering and review of radiographic studies, ordering and performing treatments and interventions, pulse oximetry, re-evaluation of patient's condition and review of old charts    Medications Ordered in the ED  ipratropium-albuterol  (DUONEB) 0.5-2.5 (3) MG/3ML nebulizer solution 3 mL  (has no administration in time range)  ipratropium-albuterol  (DUONEB) 0.5-2.5 (3) MG/3ML nebulizer solution 3 mL (has no administration in time range)  ipratropium-albuterol  (DUONEB) 0.5-2.5 (3) MG/3ML nebulizer solution 3 mL (has no administration in time range)  methylPREDNISolone  sodium succinate (SOLU-MEDROL ) 125 mg/2 mL injection 125 mg (has no administration in time range)  albuterol  (PROVENTIL ,VENTOLIN ) solution continuous neb (has no administration in time range)  furosemide  (LASIX ) injection 40 mg (has no administration in time range)    78 year old multiple medical comorbidities here for evaluation of shortness of breath.  Recently admitted and discharged 2 days ago for bacteremia.  On antibiotics.  She also has chronic respiratory failure with diagnosis of COPD, CHF.  Getting IV antibiotics at home via PICC.  Sounds like son's had difficulty with patient since discharge 2 days ago.  She comes in today diffusely wheezing.  Increased hypoxia on her home oxygen  with work of breathing.  She has normal mentation.  She does appear to have some trace edema to her lower extremities.  She has a Foley catheter in place however denies any UTI symptoms.  Suspect multifactorial acute hypoxic respiratory failure will plan on breathing treatments, steroids, Lasix , broad workup.  She recently had TEE for vegetations which was negative.  She does have a defibrillator however patient was too high risk for surgery will likely need lifetime antibiotic.   Labs and imaging personally viewed interpreted:  CBC leukocytosis 11.0, hemoglobin 9.3 Metabolic panel glucose 170, creatinine 3.01, most recent 2.6 Troponin 25 Lactic acid 0.4 BNP 660 VBG without acidosis, pCO2 54 Chest x-ray consistent with fluid overload  Patient reassessed.  Still has some wheeze.  A little more sleepy than on initial evaluation will plan on VBG to ensure no hypercarbia.  Mag, additional breathing treatment, IV Lasix   Patient  reassessed.  Son at bedside.  Will plan on admission.  VBG reassuring.  Not retaining.  Significantly more awake with son in room.  Has a nonfocal exam no altered mental status.  Discussed with Dr. Elicia with family practice agreeable to evaluate patient for admission.  Admit for acute on chronic hypoxic respiratory failure likely multifactorial.  Also getting IV antibiotics for known bacteremia.  No evidence of shock at this time.  Family and patient would like FULL CODE if respiratory status would decline.  The patient appears reasonably stabilized for admission considering the current resources, flow, and capabilities available in the ED at this time, and I doubt any other [facility] requiring further screening and/or treatment in the ED prior to admission.                                   Medical Decision Making Amount and/or Complexity of Data Reviewed Independent Historian: EMS External Data Reviewed: labs, radiology, ECG and notes. Labs: ordered. Decision-making details documented in ED Course. Radiology: ordered and independent interpretation performed. Decision-making details documented in ED Course. ECG/medicine tests: ordered and independent interpretation performed. Decision-making details documented in ED Course.  Risk OTC drugs. Prescription drug management. Parenteral controlled substances. Decision regarding hospitalization. Diagnosis or treatment significantly limited by social determinants of health.      Final diagnoses:  Acute on chronic congestive heart failure, unspecified heart failure type (HCC)  COPD exacerbation (HCC)  Bacteremia  Acute on chronic respiratory failure with hypoxia (HCC)  Indwelling Foley catheter present  AKI (acute kidney injury)    ED Discharge Orders     None          Kamonte Mcmichen A, PA-C 12/30/23 1928    Jerrol Agent, MD 12/30/23 2041

## 2023-12-30 NOTE — Assessment & Plan Note (Signed)
 Cr 3.3 (baseline ~2.7). Suspect pre-renal 2/2 CHF. At last DC, Cr was responding well to diuresis.  - diuresis as above - CTM Cr

## 2023-12-30 NOTE — Assessment & Plan Note (Addendum)
 Night team will assess pt and restart home meds as appropriate.

## 2023-12-31 ENCOUNTER — Other Ambulatory Visit: Payer: Self-pay

## 2023-12-31 ENCOUNTER — Telehealth: Payer: Self-pay

## 2023-12-31 DIAGNOSIS — B338 Other specified viral diseases: Secondary | ICD-10-CM | POA: Insufficient documentation

## 2023-12-31 LAB — GLUCOSE, CAPILLARY
Glucose-Capillary: 217 mg/dL — ABNORMAL HIGH (ref 70–99)
Glucose-Capillary: 206 mg/dL — ABNORMAL HIGH (ref 70–99)
Glucose-Capillary: 285 mg/dL — ABNORMAL HIGH (ref 70–99)

## 2023-12-31 LAB — CBC
HCT: 29.8 % — ABNORMAL LOW (ref 36.0–46.0)
Hemoglobin: 9.2 g/dL — ABNORMAL LOW (ref 12.0–15.0)
MCH: 29.2 pg (ref 26.0–34.0)
MCHC: 30.9 g/dL (ref 30.0–36.0)
MCV: 94.6 fL (ref 80.0–100.0)
Platelets: 166 K/uL (ref 150–400)
RBC: 3.15 MIL/uL — ABNORMAL LOW (ref 3.87–5.11)
RDW: 14.3 % (ref 11.5–15.5)
WBC: 8.8 K/uL (ref 4.0–10.5)
nRBC: 0 % (ref 0.0–0.2)

## 2023-12-31 LAB — CBG MONITORING, ED: Glucose-Capillary: 254 mg/dL — ABNORMAL HIGH (ref 70–99)

## 2023-12-31 LAB — BASIC METABOLIC PANEL WITH GFR
Anion gap: 8 (ref 5–15)
BUN: 40 mg/dL — ABNORMAL HIGH (ref 8–23)
CO2: 27 mmol/L (ref 22–32)
Calcium: 8.1 mg/dL — ABNORMAL LOW (ref 8.9–10.3)
Chloride: 99 mmol/L (ref 98–111)
Creatinine, Ser: 3.2 mg/dL — ABNORMAL HIGH (ref 0.44–1.00)
GFR, Estimated: 14 mL/min — ABNORMAL LOW (ref >60)
Glucose, Bld: 291 mg/dL — ABNORMAL HIGH (ref 70–99)
Potassium: 4.8 mmol/L (ref 3.5–5.1)
Sodium: 134 mmol/L — ABNORMAL LOW (ref 135–145)

## 2023-12-31 LAB — LAB REPORT - SCANNED: EGFR: 16

## 2023-12-31 LAB — MAGNESIUM: Magnesium: 2.3 mg/dL (ref 1.7–2.4)

## 2023-12-31 MED ORDER — DOXYCYCLINE HYCLATE 100 MG PO TABS
100.0000 mg | ORAL_TABLET | Freq: Two times a day (BID) | ORAL | Status: DC
  Administered 2023-12-31 – 2024-01-03 (×7): 100 mg via ORAL
  Filled 2023-12-31 (×7): qty 1

## 2023-12-31 MED ORDER — FUROSEMIDE 10 MG/ML IJ SOLN
40.0000 mg | Freq: Two times a day (BID) | INTRAMUSCULAR | Status: AC
  Administered 2023-12-31 (×2): 40 mg via INTRAVENOUS
  Filled 2023-12-31 (×2): qty 4

## 2023-12-31 MED ORDER — INSULIN ASPART 100 UNIT/ML IJ SOLN
0.0000 [IU] | Freq: Three times a day (TID) | INTRAMUSCULAR | Status: DC
  Administered 2023-12-31: 12:00:00 5 [IU] via SUBCUTANEOUS
  Administered 2024-01-01: 07:00:00 7 [IU] via SUBCUTANEOUS
  Filled 2023-12-31: qty 3
  Filled 2023-12-31: qty 7
  Filled 2023-12-31: qty 5

## 2023-12-31 MED ORDER — PREDNISONE 20 MG PO TABS
40.0000 mg | ORAL_TABLET | Freq: Every day | ORAL | Status: AC
  Administered 2023-12-31 – 2024-01-03 (×4): 40 mg via ORAL
  Filled 2023-12-31 (×4): qty 2

## 2023-12-31 NOTE — Progress Notes (Signed)
 Chaplain respnded to Rapid Response Alert.  Upon arriving on the floor saw the [facility] who said the situation was over.  AC dismissed Chaplain.  Chaplain did not visit with patient.  [place] Chaplain

## 2023-12-31 NOTE — Assessment & Plan Note (Signed)
 CKDIV - continue home sodium bicarb pAF -  continue home eliquis , metoprolol  Schizophrenia - continue home olanzapine , depakote  Gout - continue home allopurinol  HTN - continue home amlodipine  OSA - offer CPAP at bedtime

## 2023-12-31 NOTE — ED Notes (Signed)
 CCMD called.

## 2023-12-31 NOTE — Progress Notes (Signed)
 Heart Failure Navigator Progress Note  Assessed for Heart & Vascular TOC clinic readiness.  Patient does not meet criteria due to per MD note patient with history of Dementia. No HF TOC. .   Navigator will sign off at this time.   Randie Bustle, BSN, Scientist, clinical (histocompatibility and immunogenetics) Only

## 2023-12-31 NOTE — Hospital Course (Signed)
 This is an 78 year old female with PMH of afib on eliquis , CHF, CKD, COPD, CAD, HTN, T2DM, with pacemaker, schizophrenia who was admitted to the [facility] family medicine teaching service with CHF and COPD exacerbations in the setting of RSV.  Her hospital course is detailed below:  COPD exacerbation RSV infection Patient initially received a dose of IV Solu-Medrol  in the ED. on admission, prednisone  was not continued initially due to history of steroid-induced psychosis.  She previously tolerated a 5-day course of steroids during her last admission without psychosis, and after shared decision making with patient's son, elected to complete a 5 day course of prednisone  (12/15-12/19).  Doxycycline  100 mg twice daily to complete a 5-day course for COPD exacerbation was administered (12/16-12/20). Scheduled duonebs administered and home breztri  inhaler continued. Patient initially with an oxygen  requirement, and received up to 6L [place], though oxygen  saturations maintained above goal 88-92% on this amount. By 12/18, patient was able to wean to room air while maintaining saturations above goal.   CHF exacerbation Patient discharged from last hospitalization on Lasix  80 mg daily, which she had been compliant with.  Volume up on admission and patient diuresed with IV Lasix , initially 40 mg twice daily, with good response. Transitioned to po lasix  80 mg daily with continued euvolemia.   AKI Creatinine elevated at 3.3 on admission from baseline of 2.7.  Suspected prerenal in the setting of CHF exacerbation.  Patient diuresed as above and creatinine improved to 2.70 at time of discharge.  PCP Recommendations: Encourage nighttime CPAP use Evaluate diuresis regimen

## 2023-12-31 NOTE — Assessment & Plan Note (Addendum)
 Increased WOB with productive cough and wheezing. Only faint crackles.  - 40 mg IV lasix  BID today - will hold prednisone  pending discussion with son with history of steroid induced psychosis, though she tolerated 5 day course last admission - start doxycycline  100 mg BID for 5 days for COPD exacerbation - continue scheduled duonebs q4h - Cont 3L [place], wean as tolerated.   - Goal O2 88-92 - Cont home breztri  - strict I/O, daily weights - AM BMP, Mg - K >4, Mg >2 - PT eval - offer to family to reengage palliative care

## 2023-12-31 NOTE — Telephone Encounter (Signed)
 [name] calls nurse line reporting he missed a call from PCP.   He reports he will be at the hospital within the hour.   He reports he is no longer at work and PCP can call him back at anytime.   Will forward to PCP.

## 2023-12-31 NOTE — ED Notes (Signed)
 Pt refusing all morning medications. Pt stating, somebody else already gave me these medications.

## 2023-12-31 NOTE — Assessment & Plan Note (Addendum)
 Glucose 166-291. Receiving steroids. Will hold basal insulin  pending discussion with son.  - Continue linagliptin  - Start ACHS CBG + sensitive SSI

## 2023-12-31 NOTE — Progress Notes (Signed)
 Daily Progress Note Intern Pager: [phone]  Patient name: Abigail Wallace [facility] Medical record number: [id number] Date of birth: 12/17/1945 Age: 78 y.o. Gender: female  Primary Care Provider: Delores Suzann HERO, MD Consultants: none Code Status: full  Pt Overview and Major Events to Date:  12/15 - admitted   Assessment and Plan:  This is an 78 yo female with PMH of afib on eliquis , CHF, CKD, COPD, CAD, HTN, T2DM, with pacemaker, schizophrenia who was admitted with CHF and COPD exacerbation iso RSV positive. Still volume up in regard to CHF but suspect COPD exacerbation due to RSV is contributing moreso to her SOB and increased WOB given exam findings.  Assessment & Plan Acute hypoxic respiratory failure (HCC) COPD exacerbation (HCC) CHF exacerbation (HCC) RSV infection Increased WOB with productive cough and wheezing. Only faint crackles.  - 40 mg IV lasix  BID today - will hold prednisone  pending discussion with son with history of steroid induced psychosis, though she tolerated 5 day course last admission - start doxycycline  100 mg BID for 5 days for COPD exacerbation - continue scheduled duonebs q4h - Cont 3L [place], wean as tolerated.   - Goal O2 88-92 - Cont home breztri  - strict I/O, daily weights - AM BMP, Mg - K >4, Mg >2 - PT eval - offer to family to reengage palliative care Acute-on-chronic kidney injury Cr 3.3 on admission (baseline ~2.7) improved with diuresis.  - AM BMP - above management of suspected pre-renal AKI T2DM (type 2 diabetes mellitus) (HCC) Glucose 166-291. Receiving steroids. Will hold basal insulin  pending discussion with son.  - Continue linagliptin  - Start ACHS CBG + sensitive SSI Chronic health problem CKDIV - continue home sodium bicarb pAF -  continue home eliquis , metoprolol  Schizophrenia - continue home olanzapine , depakote  Gout - continue home allopurinol  HTN - continue home amlodipine  OSA - offer CPAP at bedtime  FEN/GI: heart  healthy PPx: eliquis  Dispo:Pending PT recommendations  pending clinical improvement .    Subjective:  SOB. No CP. Worried about other people catching her RSV. Sputum is white.   Objective: Temp:  [97.6 F (36.4 C)-98.8 F (37.1 C)] 97.6 F (36.4 C) (12/16 0617) Pulse Rate:  [59-72] 64 (12/16 0600) Resp:  [16-29] 20 (12/16 0600) BP: (93-162)/(32-92) 93/75 (12/16 0600) SpO2:  [92 %-98 %] 97 % (12/16 0600) Physical Exam: General: ill appearing female in NAD Cardiovascular: RRR, + JVD, no HJR, trace edema  Respiratory: increased work of breathing evident, significant wheezing in all lung fields, prominently with expiration, faint crackles on exam Abdomen: soft, nontender  Laboratory: Most recent CBC Lab Results  Component Value Date   WBC 8.8 12/31/2023   HGB 9.2 (L) 12/31/2023   HCT 29.8 (L) 12/31/2023   MCV 94.6 12/31/2023   PLT 166 12/31/2023   Most recent BMP    Latest Ref Rng & Units 12/31/2023    5:12 AM  BMP  Glucose 70 - 99 mg/dL 708   BUN 8 - 23 mg/dL 40   Creatinine 9.55 - 1.00 mg/dL 6.79   Sodium 864 - 854 mmol/L 134   Potassium 3.5 - 5.1 mmol/L 4.8   Chloride 98 - 111 mmol/L 99   CO2 22 - 32 mmol/L 27   Calcium  8.9 - 10.3 mg/dL 8.1    Mg >2  Imaging/Diagnostic Tests: None  Alena Morrison, Elio, MD 12/31/2023, 7:10 AM  PGY-1, [facility] FPTS Intern pager: [phone], text pages welcome Secure chat group [facility]  Teaching Service

## 2023-12-31 NOTE — Assessment & Plan Note (Signed)
 Cr 3.3 on admission (baseline ~2.7) improved with diuresis.  - AM BMP - above management of suspected pre-renal AKI

## 2024-01-01 ENCOUNTER — Encounter: Payer: Self-pay | Admitting: Cardiovascular Disease

## 2024-01-01 DIAGNOSIS — J210 Acute bronchiolitis due to respiratory syncytial virus: Secondary | ICD-10-CM

## 2024-01-01 DIAGNOSIS — I509 Heart failure, unspecified: Secondary | ICD-10-CM

## 2024-01-01 LAB — GLUCOSE, CAPILLARY
Glucose-Capillary: 328 mg/dL — ABNORMAL HIGH (ref 70–99)
Glucose-Capillary: 241 mg/dL — ABNORMAL HIGH (ref 70–99)
Glucose-Capillary: 273 mg/dL — ABNORMAL HIGH (ref 70–99)
Glucose-Capillary: 272 mg/dL — ABNORMAL HIGH (ref 70–99)

## 2024-01-01 LAB — BASIC METABOLIC PANEL WITH GFR
Anion gap: 11 (ref 5–15)
BUN: 46 mg/dL — ABNORMAL HIGH (ref 8–23)
CO2: 27 mmol/L (ref 22–32)
Calcium: 9.7 mg/dL (ref 8.9–10.3)
Chloride: 96 mmol/L — ABNORMAL LOW (ref 98–111)
Creatinine, Ser: 2.86 mg/dL — ABNORMAL HIGH (ref 0.44–1.00)
GFR, Estimated: 16 mL/min — ABNORMAL LOW (ref >60)
Glucose, Bld: 350 mg/dL — ABNORMAL HIGH (ref 70–99)
Potassium: 4.7 mmol/L (ref 3.5–5.1)
Sodium: 134 mmol/L — ABNORMAL LOW (ref 135–145)

## 2024-01-01 LAB — MAGNESIUM: Magnesium: 2.6 mg/dL — ABNORMAL HIGH (ref 1.7–2.4)

## 2024-01-01 MED ORDER — IPRATROPIUM-ALBUTEROL 0.5-2.5 (3) MG/3ML IN SOLN
3.0000 mL | Freq: Two times a day (BID) | RESPIRATORY_TRACT | Status: DC
  Administered 2024-01-01 – 2024-01-02 (×2): 3 mL via RESPIRATORY_TRACT
  Filled 2024-01-01 (×2): qty 3

## 2024-01-01 MED ORDER — INSULIN ASPART 100 UNIT/ML IJ SOLN
0.0000 [IU] | Freq: Three times a day (TID) | INTRAMUSCULAR | Status: DC
  Administered 2024-01-01: 12:00:00 5 [IU] via SUBCUTANEOUS
  Administered 2024-01-01: 18:00:00 8 [IU] via SUBCUTANEOUS
  Administered 2024-01-02 (×3): 5 [IU] via SUBCUTANEOUS
  Administered 2024-01-03: 3 [IU] via SUBCUTANEOUS
  Administered 2024-01-03: 5 [IU] via SUBCUTANEOUS
  Filled 2024-01-01 (×3): qty 5
  Filled 2024-01-01: qty 8
  Filled 2024-01-01: qty 5
  Filled 2024-01-01: qty 8
  Filled 2024-01-01: qty 3

## 2024-01-01 MED ORDER — SODIUM CHLORIDE 0.9% FLUSH: 10.0000 mL | INTRAVENOUS | Status: DC | PRN

## 2024-01-01 MED ORDER — SODIUM CHLORIDE 0.9% FLUSH
10.0000 mL | Freq: Two times a day (BID) | INTRAVENOUS | Status: DC
  Administered 2024-01-01: 10 mL
  Administered 2024-01-02: 22:00:00 20 mL
  Administered 2024-01-02 – 2024-01-03 (×2): 10 mL

## 2024-01-01 MED ORDER — BISACODYL 10 MG RE SUPP
10.0000 mg | Freq: Every evening | RECTAL | Status: DC | PRN
  Administered 2024-01-01: 19:00:00 10 mg via RECTAL
  Filled 2024-01-01: qty 1

## 2024-01-01 MED ORDER — FUROSEMIDE 40 MG PO TABS
80.0000 mg | ORAL_TABLET | Freq: Every day | ORAL | Status: AC
  Administered 2024-01-01: 12:00:00 80 mg via ORAL
  Filled 2024-01-01: qty 2

## 2024-01-01 MED ORDER — CHLORHEXIDINE GLUCONATE CLOTH 2 % EX PADS
6.0000 | MEDICATED_PAD | Freq: Every day | CUTANEOUS | Status: DC
  Administered 2024-01-01 – 2024-01-03 (×3): 6 via TOPICAL

## 2024-01-01 MED ORDER — INSULIN GLARGINE 100 UNIT/ML ~~LOC~~ SOLN
5.0000 [IU] | Freq: Every day | SUBCUTANEOUS | Status: DC
  Administered 2024-01-01: 12:00:00 5 [IU] via SUBCUTANEOUS
  Filled 2024-01-01 (×2): qty 0.05

## 2024-01-01 MED ORDER — INSULIN ASPART 100 UNIT/ML IJ SOLN
0.0000 [IU] | Freq: Every day | INTRAMUSCULAR | Status: DC
  Administered 2024-01-01: 22:00:00 3 [IU] via SUBCUTANEOUS
  Administered 2024-01-02: 22:00:00 2 [IU] via SUBCUTANEOUS
  Filled 2024-01-01: qty 2
  Filled 2024-01-01: qty 3

## 2024-01-01 NOTE — Assessment & Plan Note (Signed)
 Cr improving and closer to 2.7 baseline today.  - AM BMP - above management of suspected pre-renal AKI

## 2024-01-01 NOTE — Assessment & Plan Note (Addendum)
-   continue doxycycline  (12/16-12/20) - continue PO Prednisone  40 mg daily (12/15-12/19) - scheduled duonebs - breztri  inhaler BID - Cont 3L [place], wean as tolerated.   - Goal O2 88-92

## 2024-01-01 NOTE — Assessment & Plan Note (Addendum)
 Overnight placed on HFNC at 5 L. Reported UOP 2.8 L overnight. Last reported dry weight 144 kg, 146 kg this morning. Responded well to 40 mg IV lasix  twice yesterday. Improvement of creatinine today with diuresis. Will continue aggressive diuresis to achieve euvolemia.  - transition to 80 mg PO lasix  - K>4, Mg>2 - strict I/O, daily weights - AM BMP, Mg

## 2024-01-01 NOTE — Progress Notes (Signed)
 Daily Progress Note Intern Pager: [phone]  Patient name: Abigail Wallace [facility] Medical record number: [id number] Date of birth: 1945/05/04 Age: 78 y.o. Gender: female  Primary Care Provider: Delores Suzann HERO, MD Consultants: none Code Status: full  Pt Overview and Major Events to Date:  12/15 - admitted   Assessment and Plan:  This is an 78 yo female with PMH of afib on eliquis , CHF, CKD, COPD, CAD, HTN, T2DM, with pacemaker, schizophrenia who is undergoing treatment for CHF and COPD exacerbation iso RSV positive.  Assessment & Plan Acute hypoxic respiratory failure (HCC) CHF exacerbation (HCC) Overnight placed on HFNC at 5 L. Reported UOP 2.8 L overnight. Last reported dry weight 144 kg, 146 kg this morning. Responded well to 40 mg IV lasix  twice yesterday. Improvement of creatinine today with diuresis. Will continue aggressive diuresis to achieve euvolemia.  - transition to 80 mg PO lasix  - K>4, Mg>2 - strict I/O, daily weights - AM BMP, Mg COPD exacerbation (HCC) RSV infection - continue doxycycline  (12/16-12/20) - continue PO Prednisone  40 mg daily (12/15-12/19) - scheduled duonebs - breztri  inhaler BID - Cont 3L [place], wean as tolerated.   - Goal O2 88-92 Acute-on-chronic kidney injury Cr improving and closer to 2.7 baseline today.  - AM BMP - above management of suspected pre-renal AKI T2DM (type 2 diabetes mellitus) (HCC) Glucose 217-350. Receiving steroids and got 12 units short acting insulin .  - Continue linagliptin  - continue ACHS CBG - increase to moderate SSI and at HS correction - start 5 units basal insulin  Chronic health problem CKDIV - continue home sodium bicarb pAF -  continue home eliquis , metoprolol  Schizophrenia - continue home olanzapine , depakote  Gout - continue home allopurinol  HTN - continue home amlodipine  OSA - offer CPAP at bedtime  FEN/GI: heart healthy PPx: eliquis  Dispo:Pending PT recommendations  pending clinical improvement  .  Subjective:  Patient requests removal of telemetry leads as it is painful on her breasts. Patient reports she had trouble sleeping overnight as there was no ice cream. Briefly affirms SOB. Reports she does not want to wear CPAP. Did have one desat to 25 with good pleth on tele.   Objective: Temp:  [97.7 F (36.5 C)-98.1 F (36.7 C)] 97.7 F (36.5 C) (12/17 0444) Pulse Rate:  [59-84] 59 (12/17 0444) Resp:  [16-23] 23 (12/17 0444) BP: (114-157)/(49-112) 142/72 (12/17 0444) SpO2:  [90 %-99 %] 97 % (12/17 0444) Weight:  [146 kg] 146 kg (12/17 0444)  Physical Exam: General: lying on left side in bed, looks improved from yesterday Cardiovascular: RRR, no murmur appreciated,  Respiratory: expiratory wheeze and increased expiratory phase Abdomen: soft, nontender Extremities: 1+ pitting edema   Laboratory: Most recent CBC Lab Results  Component Value Date   WBC 8.8 12/31/2023   HGB 9.2 (L) 12/31/2023   HCT 29.8 (L) 12/31/2023   MCV 94.6 12/31/2023   PLT 166 12/31/2023   Most recent BMP    Latest Ref Rng & Units 01/01/2024    5:00 AM  BMP  Glucose 70 - 99 mg/dL 649   BUN 8 - 23 mg/dL 46   Creatinine 9.55 - 1.00 mg/dL 7.13   Sodium 864 - 854 mmol/L 134   Potassium 3.5 - 5.1 mmol/L 4.7   Chloride 98 - 111 mmol/L 96   CO2 22 - 32 mmol/L 27   Calcium  8.9 - 10.3 mg/dL 9.7    Mg 2.6  Imaging/Diagnostic Tests: None  Alena Morrison, Alakai Macbride, MD 01/01/2024, 7:54 AM  PGY-1, [facility] FPTS Intern pager: [phone], text pages welcome Secure chat group [facility] Teaching Service

## 2024-01-01 NOTE — Assessment & Plan Note (Signed)
 CKDIV - continue home sodium bicarb pAF -  continue home eliquis , metoprolol  Schizophrenia - continue home olanzapine , depakote  Gout - continue home allopurinol  HTN - continue home amlodipine  OSA - offer CPAP at bedtime

## 2024-01-01 NOTE — Inpatient Diabetes Management (Signed)
 Inpatient Diabetes Program Recommendations  AACE/ADA: New Consensus Statement on Inpatient Glycemic Control (2015)  Target Ranges:  Prepandial:   less than 140 mg/dL      Peak postprandial:   less than 180 mg/dL (1-2 hours)      Critically ill patients:  140 - 180 mg/dL   Lab Results  Component Value Date   GLUCAP 328 (H) 01/01/2024   HGBA1C 6.5 (H) 09/27/2023    Review of Glycemic Control  Diabetes history: DM2 Outpatient Diabetes medications:  Tradjenta  5 mg daily Current orders for Inpatient glycemic control: Novolog  0-9 units tid Tradjenta  5 mg daily Prednisone  40 mg daily  Inpatient Diabetes Program Recommendations:   Please consider while on steroids -Add Novolog  3 units tid meal coverage if eats 50% meal -Add Novolog  0-5 units hs correction  Thank you, Tamlyn Sides E. Klark Vanderhoef, RN, MSN, CNS, CDCES  Diabetes Coordinator Inpatient Glycemic Control Team Team Pager [phone] (8am-5pm) 01/01/2024 9:27 AM

## 2024-01-01 NOTE — Assessment & Plan Note (Addendum)
 Glucose 217-350. Receiving steroids and got 12 units short acting insulin .  - Continue linagliptin  - continue ACHS CBG - increase to moderate SSI and at HS correction - start 5 units basal insulin 

## 2024-01-01 NOTE — Evaluation (Signed)
 Physical Therapy Evaluation and Discharge Patient Details Name: Abigail Wallace MRN: [id number] DOB: Nov 04, 1945 Today's Date: 01/01/2024  History of Present Illness  Pt is an 78 y.o. F presenting to [facility] on 12/30/23 with CHF and COPD exacerbation. PMH is significant for CHF, CKD, COPD, CAD, HTN, T2DM, and pacemaker.     Hoyer lift OOB baseline  Aide 7AM - 6PM M-F  Clinical Impression  Prior to admittance, pt was needing physical assistance for all mobility needs with son and aide providing assist. Pt is a hoyer lift for all OOB mobility into a wheelchair. Pt presents to evaluation near baseline level of function, performing bed mobility with moderate-maximal physical assistance. Identified no physical therapy needs at this time. Therapist encouraged pt and pt's son to reach out if they feel status changes and would like a new consult. PT signing off at this time.         If plan is discharge home, recommend the following: A lot of help with walking and/or transfers;A lot of help with bathing/dressing/bathroom;Assistance with feeding;Direct supervision/assist for medications management;Direct supervision/assist for financial management;Assistance with cooking/housework;Assist for transportation;Help with stairs or ramp for entrance;Supervision due to cognitive status   Can travel by private vehicle        Equipment Recommendations None recommended by PT  Recommendations for Other Services       Functional Status Assessment Patient has not had a recent decline in their functional status     Precautions / Restrictions Precautions Precautions: Fall Recall of Precautions/Restrictions: Intact Precaution/Restrictions Comments: watch SpO2 (recently wearing 2L O2 at home); chronic indwelling foley cath Restrictions Weight Bearing Restrictions Per Provider Order: No      Mobility  Bed Mobility Overal bed mobility: Needs Assistance Bed Mobility: Rolling, Supine to Sit Rolling: Max  assist, +2 for safety/equipment, Used rails   Supine to sit: +2 for physical assistance, Mod assist     General bed mobility comments: Pt rolled L and R for bed pan placement and removal x2. For supine to sit, pt requires physical assistance for trunk and BLE. VC given for sequencing; increased time to complete.    Transfers                        Ambulation/Gait                  Stairs            Wheelchair Mobility     Tilt Bed    Modified Rankin (Stroke Patients Only)       Balance Overall balance assessment: Needs assistance Sitting-balance support: Bilateral upper extremity supported, Feet unsupported Sitting balance-Leahy Scale: Fair Sitting balance - Comments: pt able to sit EOB w/out physical assistance for ~3 minutes. pt will occasionally lean on L side for energy conservation and balance Postural control: Left lateral lean                                   Pertinent Vitals/Pain Pain Assessment Pain Assessment: No/denies pain    Home Living Family/patient expects to be discharged to:: Private residence Living Arrangements: Children Available Help at Discharge: Family;Available 24 hours/day;Personal care attendant (Aide M-F from 7am-6pm; son and aide assist wtih all ADLs/mobility needs) Type of Home: Apartment Home Access: Level entry       Home Layout: One level Home Equipment: Shower seat;BSC/3in1;Tub bench;Rollator (4 wheels);Hospital bed;Wheelchair -  manual;Other (comment) Additional Comments: hoyer lift, PCA 7a-6p    Prior Function Prior Level of Function : Needs assist             Mobility Comments: modA to sit EOB; hoyer lift for all OOB, including to wheelchair and bathroom ADLs Comments: son/PCA assist with all aspects of AD's, son gets her in W/C via hoyer to wash her hair; pt able to feed self     Extremity/Trunk Assessment   Upper Extremity Assessment Upper Extremity Assessment: Generalized  weakness    Lower Extremity Assessment Lower Extremity Assessment: Generalized weakness    Cervical / Trunk Assessment Cervical / Trunk Assessment: Kyphotic  Communication   Communication Communication: Impaired Factors Affecting Communication: Reduced clarity of speech    Cognition Arousal: Alert Behavior During Therapy: WFL for tasks assessed/performed   PT - Cognitive impairments: History of cognitive impairments                         Following commands: Impaired Following commands impaired: Follows one step commands with increased time     Cueing Cueing Techniques: Verbal cues     General Comments General comments (skin integrity, edema, etc.): VSS on 6L HFNC    Exercises     Assessment/Plan    PT Assessment Patient does not need any further PT services  PT Problem List         PT Treatment Interventions      PT Goals (Current goals can be found in the Care Plan section)  Acute Rehab PT Goals Patient Stated Goal: feel better PT Goal Formulation: With patient/family Time For Goal Achievement: 01/15/24 Potential to Achieve Goals: Fair    Frequency       Co-evaluation               AM-PAC PT 6 Clicks Mobility  Outcome Measure Help needed turning from your back to your side while in a flat bed without using bedrails?: Total Help needed moving from lying on your back to sitting on the side of a flat bed without using bedrails?: Total Help needed moving to and from a bed to a chair (including a wheelchair)?: Total Help needed standing up from a chair using your arms (e.g., wheelchair or bedside chair)?: Total Help needed to walk in hospital room?: Total Help needed climbing 3-5 steps with a railing? : Total 6 Click Score: 6    End of Session Equipment Utilized During Treatment: Oxygen  Activity Tolerance: Patient tolerated treatment well Patient left: in bed;with call bell/phone within reach;with family/visitor present Nurse  Communication: Mobility status;Need for lift equipment PT Visit Diagnosis: Other abnormalities of gait and mobility (R26.89);Muscle weakness (generalized) (M62.81)    Time: 8994-8978 PT Time Calculation (min) (ACUTE ONLY): 16 min   Charges:   PT Evaluation $PT Eval Moderate Complexity: 1 Mod   PT General Charges $$ ACUTE PT VISIT: 1 Visit         Leontine Hilt DPT Acute Rehab Services [phone] Prefer contact via chat   Leontine NOVAK Ladonna Vanorder 01/01/2024, 12:07 PM

## 2024-01-02 ENCOUNTER — Telehealth: Payer: Self-pay

## 2024-01-02 DIAGNOSIS — J9601 Acute respiratory failure with hypoxia: Secondary | ICD-10-CM

## 2024-01-02 DIAGNOSIS — J441 Chronic obstructive pulmonary disease with (acute) exacerbation: Secondary | ICD-10-CM | POA: Diagnosis not present

## 2024-01-02 DIAGNOSIS — J210 Acute bronchiolitis due to respiratory syncytial virus: Secondary | ICD-10-CM | POA: Diagnosis not present

## 2024-01-02 DIAGNOSIS — I5043 Acute on chronic combined systolic (congestive) and diastolic (congestive) heart failure: Secondary | ICD-10-CM | POA: Diagnosis not present

## 2024-01-02 LAB — GLUCOSE, CAPILLARY
Glucose-Capillary: 205 mg/dL — ABNORMAL HIGH (ref 70–99)
Glucose-Capillary: 159 mg/dL — ABNORMAL HIGH (ref 70–99)
Glucose-Capillary: 206 mg/dL — ABNORMAL HIGH (ref 70–99)
Glucose-Capillary: 234 mg/dL — ABNORMAL HIGH (ref 70–99)
Glucose-Capillary: 244 mg/dL — ABNORMAL HIGH (ref 70–99)

## 2024-01-02 LAB — BASIC METABOLIC PANEL WITH GFR
Anion gap: 9 (ref 5–15)
BUN: 44 mg/dL — ABNORMAL HIGH (ref 8–23)
CO2: 26 mmol/L (ref 22–32)
Calcium: 8.2 mg/dL — ABNORMAL LOW (ref 8.9–10.3)
Chloride: 103 mmol/L (ref 98–111)
Creatinine, Ser: 2.41 mg/dL — ABNORMAL HIGH (ref 0.44–1.00)
GFR, Estimated: 20 mL/min — ABNORMAL LOW (ref >60)
Glucose, Bld: 206 mg/dL — ABNORMAL HIGH (ref 70–99)
Potassium: 3.3 mmol/L — ABNORMAL LOW (ref 3.5–5.1)
Sodium: 138 mmol/L (ref 135–145)

## 2024-01-02 LAB — MAGNESIUM: Magnesium: 2 mg/dL (ref 1.7–2.4)

## 2024-01-02 MED ORDER — INSULIN GLARGINE 100 UNIT/ML ~~LOC~~ SOLN
8.0000 [IU] | Freq: Every day | SUBCUTANEOUS | Status: DC
  Administered 2024-01-02 – 2024-01-03 (×2): 8 [IU] via SUBCUTANEOUS
  Filled 2024-01-02 (×2): qty 0.08

## 2024-01-02 MED ORDER — POTASSIUM CHLORIDE CRYS ER 20 MEQ PO TBCR
40.0000 meq | EXTENDED_RELEASE_TABLET | Freq: Once | ORAL | Status: AC
  Administered 2024-01-02: 11:00:00 40 meq via ORAL
  Filled 2024-01-02: qty 2

## 2024-01-02 MED ORDER — FUROSEMIDE 40 MG PO TABS
80.0000 mg | ORAL_TABLET | Freq: Every day | ORAL | Status: AC
  Administered 2024-01-02: 11:00:00 80 mg via ORAL
  Filled 2024-01-02: qty 2

## 2024-01-02 MED ORDER — IPRATROPIUM-ALBUTEROL 0.5-2.5 (3) MG/3ML IN SOLN: 3.0000 mL | Freq: Four times a day (QID) | RESPIRATORY_TRACT | Status: DC | PRN

## 2024-01-02 MED ORDER — PENICILLIN G POTASSIUM IV (FOR PTA / DISCHARGE USE ONLY): 12.0000 10*6.[IU] | INTRAVENOUS | 0 refills | Status: DC

## 2024-01-02 MED ORDER — POTASSIUM CHLORIDE 20 MEQ PO PACK: 40.0000 meq | PACK | Freq: Once | ORAL | Status: DC

## 2024-01-02 MED ORDER — INSULIN GLARGINE 100 UNIT/ML ~~LOC~~ SOLN: 3.0000 [IU] | Freq: Once | SUBCUTANEOUS | Status: DC

## 2024-01-02 NOTE — Assessment & Plan Note (Addendum)
 She is doing much better this morning. O2 turned off at bedside with maintenance of sats >95% while conversing.  - continue doxycycline  (12/16-12/20) - continue PO Prednisone  40 mg daily (12/15-12/19) - will change duonebs to q6h PRN  - breztri  inhaler BID - Wean O2 as able, previously on RA-2L  - Goal O2 88-92

## 2024-01-02 NOTE — Progress Notes (Signed)
 Daily Progress Note Intern Pager: [phone]  Patient name: Abigail Wallace Medical record number: [id number] Date of birth: Sep 03, 1945 Age: 78 y.o. Gender: female  Primary Care Provider: Delores Suzann HERO, MD Consultants: none Code Status: full  Pt Overview and Major Events to Date:  12/15 - admitted   Assessment and Plan:  This is an 78 yo female with PMH of afib on eliquis , CHF, CKD, COPD, CAD, HTN, T2DM, with pacemaker, schizophrenia who is undergoing treatment for CHF and COPD exacerbation iso RSV positive. This is somewhere between day 4-6 of RSV illness (reported gradual worsening of SOB since discharge on 12/12, but no clear date of onset), and I suspect her viral burden of disease is improving as is expected with its natural course. Will continue supportive care and treatment of COPD/CHF as below.   Assessment & Plan COPD exacerbation (HCC) RSV infection She is doing much better this morning. O2 turned off at bedside with maintenance of sats >95% while conversing.  - continue doxycycline  (12/16-12/20) - continue PO Prednisone  40 mg daily (12/15-12/19) - will change duonebs to q6h PRN  - breztri  inhaler BID - Wean O2 as able, previously on RA-2L  - Goal O2 88-92 Acute hypoxic respiratory failure (HCC) CHF exacerbation (HCC) K 3.3, repleted. Weight down 0.6 kg today, net I/O -3.8 L yesterday. Notably these do not correlate, but she does not appear very hypervolemic on exam today.  - Continue PO lasix  80 mg, which is last discharge home dose - K>4, Mg>2 - strict I/O, daily weights - AM BMP, Mg Acute-on-chronic kidney injury Cr continues to improve, 2.41 today.   - AM BMP - above management of suspected pre-renal AKI T2DM (type 2 diabetes mellitus) (HCC) Glucose 205-273. Fasting this morning, 205. Received 21 units short acting, 5 units basal. Also linagliptin  (home med) and now receiving steroids for COPD exacerbation.  - Increase basal insulin  to 8 units. -  Continue linagliptin  - continue ACHS CBG - continue moderate SSI and HS correction Chronic health problem CKDIV - continue home sodium bicarb pAF -  continue home eliquis , metoprolol  Schizophrenia - continue home olanzapine , depakote  Gout - continue home allopurinol  HTN - continue home amlodipine  OSA - offer CPAP at bedtime, though she insists she will not wear this  FEN/GI: heart healthy PPx: eliquis  Dispo:Home pending clinical improvement .    Subjective:  Patient feeling much better this morning. No CP, abd pain, SOB. Had a small BM this morning.   Objective: Temp:  [97.6 F (36.4 C)-98 F (36.7 C)] 97.6 F (36.4 C) (12/18 0402) Pulse Rate:  [55-91] 55 (12/18 0402) Resp:  [17-23] 17 (12/18 0402) BP: (149-185)/(58-74) 149/59 (12/18 0402) SpO2:  [92 %-94 %] 93 % (12/18 0402) Weight:  [145.4 kg] 145.4 kg (12/18 0402) Physical Exam: General: well appearing female sitting up in bed, NAD Cardiovascular: RRR, no JVD or HJR, 2+ radial pulses Respiratory: no increased work of breathing on room air at time of exam Abdomen: soft, + bowel sounds, nontender, obese Extremities: 1+ pretibial edema  Laboratory: Most recent CBC Lab Results  Component Value Date   WBC 8.8 12/31/2023   HGB 9.2 (L) 12/31/2023   HCT 29.8 (L) 12/31/2023   MCV 94.6 12/31/2023   PLT 166 12/31/2023   Most recent BMP    Latest Ref Rng & Units 01/02/2024    5:50 AM  BMP  Glucose 70 - 99 mg/dL 793   BUN 8 - 23 mg/dL 44   Creatinine  0.44 - 1.00 mg/dL 7.58   Sodium 864 - 854 mmol/L 138   Potassium 3.5 - 5.1 mmol/L 3.3   Chloride 98 - 111 mmol/L 103   CO2 22 - 32 mmol/L 26   Calcium  8.9 - 10.3 mg/dL 8.2    Mg 2.0  Imaging/Diagnostic Tests: None  Alena Morrison, Taten Merrow, MD 01/02/2024, 7:32 AM  PGY-1, [facility] Family Medicine FPTS Intern pager: [phone], text pages welcome Secure chat group [facility] Teaching Service

## 2024-01-02 NOTE — TOC Initial Note (Addendum)
 Transition of Care [facility]) - Initial/Assessment Note    Patient Details  Name: Abigail Wallace MRN: [id number] Date of Birth: [birth year]  [facility] CM/SW Contact:    Waddell Barnie Rama, RN Phone Number: 01/02/2024, 1:38 PM  Clinical Narrative:                 From home with son, ([name]), has PCP  and has a home follow up on 12/30 with Dr. Delores per her Dr's office and she does not like to see any other doctor other than Dr. Delores and has insurance on file, states has [facility] services in place with [name] for [facility] for IV infusion and also has an aide with [facility] Mon - Fri from 7a- 3 pm and 3pm - 7 pm , has home oxygen  2 liters with [employer], and she gets diapers thru [employer] at home.  States she will need ambulance transport  at [employer] , per doctor they would like to schedule with [facility] and not [facility] and family is support system, states gets medications from [facility] and [facility].    [facility] bedbound. ICM will follow for dc needs. Per MD may be ready for dc tomorrow. Will need to call son Fairy) to see what time he wants [facility] to transport patient.   [facility] transport has been scheduled for 5 pm tomorrow 12/19.   Expected Discharge Plan: Home w Home Health Services Barriers to Discharge: Continued Medical Work up   Patient Goals and CMS Choice Patient states their goals for this hospitalization and ongoing recovery are:: return home with [facility] CMS Medicare.gov Compare Post Acute Care list provided to:: Patient Choice offered to / list presented to : Patient      Expected Discharge Plan and Services In-house Referral: NA Discharge Planning Services: CM Consult Post Acute Care Choice: Home Health Living arrangements for the past 2 months: Single Family Home                 DME Arranged: N/A DME Agency: NA       HH Arranged: RN HH Agency:  [employer]) Date HH Agency Contacted: 01/02/24 Time HH Agency Contacted: 1337 Representative spoke with at  [facility] Agency: Jon  Prior Living Arrangements/Services Living arrangements for the past 2 months: Single Family Home Lives with:: Adult Children (Son) Patient language and need for interpreter reviewed:: Yes        Need for Family Participation in Patient Care: Yes (Comment) Care giver support system in place?: Yes (comment) Current home services: Home RN, Homehealth aide (has HHRN with [name] for IV ABX infusion and has [facility] aide) Criminal Activity/Legal Involvement Pertinent to Current Situation/Hospitalization: No - Comment as needed  Activities of Daily Living   ADL Screening (condition at time of admission) Independently performs ADLs?: No Is the patient deaf or have difficulty hearing?: No Does the patient have difficulty seeing, even when wearing glasses/contacts?: No Does the patient have difficulty concentrating, remembering, or making decisions?: No  Permission Sought/Granted Permission sought to share information with : Case Manager, Family Supports, Other (comment) (HH) Permission granted to share information with : Yes, Verbal Permission Granted     Permission granted to share info w AGENCY: [name]        Emotional Assessment Appearance:: Appears stated age Attitude/Demeanor/Rapport: Engaged Affect (typically observed): Appropriate Orientation: : Oriented to Self, Oriented to Place, Oriented to  Time, Oriented to Situation Alcohol  / Substance Use: Not Applicable Psych Involvement: No (comment)  Admission diagnosis:  Bacteremia [R78.81] COPD exacerbation (HCC) [J44.1] AKI (acute kidney injury) [N17.9] Acute on chronic respiratory failure with hypoxia (HCC) [J96.21] Acute right-sided thoracic back pain [M54.6] Indwelling Foley catheter present [Z97.8] Stage 3b chronic kidney disease (HCC) [N18.32] Acute on chronic congestive heart failure, unspecified heart failure type (HCC) [I50.9] Acute hypoxic respiratory failure (HCC) [J96.01] Patient Active Problem List    Diagnosis Date Noted   RSV infection 12/31/2023   Acute hypoxic respiratory failure (HCC) 12/30/2023   Frequent loose stools 12/24/2023   Streptococcal bacteremia 12/22/2023   Hallucinations 12/22/2023   Subacute bacterial endocarditis 12/22/2023   Infected pacemaker 12/22/2023   Chronic nausea 12/22/2023   Altered mental status 12/20/2023   UTI (urinary tract infection) 12/20/2023   Acute on chronic diastolic heart failure (HCC) 12/20/2023   Chronic kidney disease (CKD), stage 4 (HCC) 12/20/2023   Dizziness 12/20/2023   Lumbar pain 11/25/2023   Anemia 11/24/2023   Indwelling catheter present on admission 11/24/2023   Urinary tract infection associated with indwelling urethral catheter 09/26/2023   Heart failure with preserved ejection fraction (HCC) 08/09/2023   Chronic health problem 06/14/2023   Acute on chronic heart failure (HCC) 06/14/2023   Acute-on-chronic kidney injury 02/11/2023   T2DM (type 2 diabetes mellitus) (HCC) 02/08/2023   Meningioma (HCC) 04/06/2022   CHF exacerbation (HCC) 02/27/2022   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   Cholesteatoma 12/24/2021   Advanced care planning/counseling discussion 09/29/2021   COPD exacerbation (HCC) 03/15/2021   Chronic back pain 11/14/2018   Pulmonary nodules 03/03/2018   Paroxysmal atrial fibrillation (HCC) s/p cardioversion  01/28/2018   Dependence on wheelchair 05/17/2017   Abdominal aortic ectasia 01/16/2017   Renal cyst 01/16/2017   CKD stage IIIb 09/12/2016   Well controlled type 2 diabetes mellitus (HCC) 03/20/2012   Gout 09/26/2011   Hypertension, isolated systolic 04/26/2011   Tardive dyskinesia 04/26/2011   Artificial cardiac pacemaker 03/30/2010   Vaginal prolapse 03/27/2010   Obstructive sleep apnea 03/27/2010   Osteoarthritis of right knee 03/27/2010   Resting tremor 12/15/2009   Sick sinus syndrome (HCC) 04/24/2006   Schizophrenia (HCC) 03/14/2006   Chronic obstructive pulmonary disease (HCC)  03/14/2006   PCP:  Delores Suzann HERO, MD Pharmacy:   [facility] [place], [place] - [address] [facility] [address] [place] [address] Phone: [phone] Fax: [phone]  [name] [address] [place] [address] Phone: [phone] Fax: [phone]     Social Drivers of Health (SDOH) Social History: SDOH Screenings   Food Insecurity: No Food Insecurity (01/01/2024)  Housing: Low Risk (01/01/2024)  Transportation Needs: No Transportation Needs (01/01/2024)  Utilities: Not At Risk (01/01/2024)  Alcohol  Screen: Low Risk (12/27/2020)  Depression (PHQ2-9): High Risk (09/30/2023)  Financial Resource Strain: Low Risk (03/23/2022)  Physical Activity: Inactive (12/27/2020)  Social Connections: Socially Isolated (01/01/2024)  Stress: No Stress Concern Present (12/27/2020)  Tobacco Use: Medium Risk (12/20/2023)   SDOH Interventions:     Readmission Risk Interventions    01/02/2024    1:34 PM 12/20/2023    2:55 PM 11/26/2023   11:22 AM  Readmission Risk Prevention Plan  Transportation Screening Complete Complete Complete  PCP or Specialist Appt within 3-5 Days   Complete  HRI or Home Care Consult   Complete  Social Work Consult for Recovery Care Planning/Counseling   Complete  Palliative Care Screening   Not Applicable  Medication Review Oceanographer) Complete Complete Referral to Pharmacy  PCP or Specialist appointment within 3-5 days of discharge  Complete   HRI or Home Care Consult Complete Complete   Palliative Care Screening Not Applicable Not Applicable   Skilled Nursing Facility Not Applicable

## 2024-01-02 NOTE — Assessment & Plan Note (Addendum)
 K 3.3, repleted. Weight down 0.6 kg today, net I/O -3.8 L yesterday. Notably these do not correlate, but she does not appear very hypervolemic on exam today.  - Continue PO lasix  80 mg, which is last discharge home dose - K>4, Mg>2 - strict I/O, daily weights - AM BMP, Mg

## 2024-01-02 NOTE — Telephone Encounter (Signed)
 [name] returns call to nurse line.   He reports he was told the patients discharge is tentative for tomorrow.   He reports he would like to discuss with Dr. Delores and/or Dr. Orie prior to actual discharge.   He reports he wants to make sure she will be able to be cared for in the home and not have to return to [facility] after back to back admissions.   Advised will forward to PCP and [place].

## 2024-01-02 NOTE — Plan of Care (Signed)
  Problem: Clinical Measurements: Goal: Will remain free from infection Outcome: Progressing   Problem: Clinical Measurements: Goal: Diagnostic test results will improve Outcome: Progressing   Problem: Education: Goal: Knowledge of General Education information will improve Description: Including pain rating scale, medication(s)/side effects and non-pharmacologic comfort measures Outcome: Progressing   

## 2024-01-02 NOTE — Telephone Encounter (Signed)
 Abigail Wallace LVM on nurse line requesting to speak with PCP.  I called Abigail Wallace back, however I had to leave VM.   Will await his return call.

## 2024-01-02 NOTE — Progress Notes (Signed)
 PHARMACY CONSULT NOTE FOR:   OUTPATIENT  PARENTERAL ANTIBIOTIC THERAPY (OPAT)   Indication: streptococcus mitis/oralis bacteremia Regimen: Penicillin  G 12 million units daily given as a continuous infusion End date: 02/01/2024   IV antibiotic discharge orders are pended. To discharging provider:  please sign these orders via discharge navigator,  Select New Orders & click on the button choice - Manage This Unsigned Work.   Thank you for allowing pharmacy to be a part of this patients care.  Abigail Wallace, PharmD, BCPS, BCIDP Infectious Diseases Clinical Pharmacist 01/02/2024 1:52 PM   **Pharmacist phone directory can now be found on [email or web address] ([email or web address]).  Listed under [facility].

## 2024-01-02 NOTE — Assessment & Plan Note (Signed)
 CKDIV - continue home sodium bicarb pAF -  continue home eliquis , metoprolol  Schizophrenia - continue home olanzapine , depakote  Gout - continue home allopurinol  HTN - continue home amlodipine  OSA - offer CPAP at bedtime, though she insists she will not wear this

## 2024-01-02 NOTE — Inpatient Diabetes Management (Signed)
 Inpatient Diabetes Program Recommendations  AACE/ADA: New Consensus Statement on Inpatient Glycemic Control (2015)  Target Ranges:  Prepandial:   less than 140 mg/dL      Peak postprandial:   less than 180 mg/dL (1-2 hours)      Critically ill patients:  140 - 180 mg/dL   Lab Results  Component Value Date   GLUCAP 205 (H) 01/02/2024   HGBA1C 6.5 (H) 09/27/2023    Review of Glycemic Control  Latest Reference Range & Units 01/01/24 06:13 01/01/24 11:32 01/01/24 16:09 01/01/24 20:57 01/02/24 06:22  Glucose-Capillary 70 - 99 mg/dL 671 (H) 758 (H) 726 (H) 272 (H) 205 (H)  (H): Data is abnormally high Diabetes history: DM2 Outpatient Diabetes medications:  Tradjenta  5 mg daily Current orders for Inpatient glycemic control: Novolog  0-15 units tid, Lantus  5 units QD Tradjenta  5 mg daily Prednisone  40 mg daily   Inpatient Diabetes Program Recommendations:   Consider while on steroids -Add Novolog  3 units tid meal coverage if eats 50% meal - Increase Lantus  to 12 units every day  Thanks, Tinnie Minus, MSN, RNC-OB Diabetes Coordinator [phone] (8a-5p)

## 2024-01-02 NOTE — Assessment & Plan Note (Signed)
 Glucose 205-273. Fasting this morning, 205. Received 21 units short acting, 5 units basal. Also linagliptin  (home med) and now receiving steroids for COPD exacerbation.  - Increase basal insulin  to 8 units. - Continue linagliptin  - continue ACHS CBG - continue moderate SSI and HS correction

## 2024-01-02 NOTE — Telephone Encounter (Signed)
 Called Son, left voice mail.

## 2024-01-02 NOTE — Assessment & Plan Note (Signed)
 Cr continues to improve, 2.41 today.   - AM BMP - above management of suspected pre-renal AKI

## 2024-01-03 ENCOUNTER — Telehealth: Payer: Self-pay | Admitting: Family Medicine

## 2024-01-03 ENCOUNTER — Other Ambulatory Visit (HOSPITAL_COMMUNITY): Payer: Self-pay

## 2024-01-03 DIAGNOSIS — I5043 Acute on chronic combined systolic (congestive) and diastolic (congestive) heart failure: Secondary | ICD-10-CM | POA: Diagnosis not present

## 2024-01-03 DIAGNOSIS — J9601 Acute respiratory failure with hypoxia: Secondary | ICD-10-CM | POA: Diagnosis not present

## 2024-01-03 DIAGNOSIS — J210 Acute bronchiolitis due to respiratory syncytial virus: Secondary | ICD-10-CM | POA: Diagnosis not present

## 2024-01-03 DIAGNOSIS — J441 Chronic obstructive pulmonary disease with (acute) exacerbation: Secondary | ICD-10-CM | POA: Diagnosis not present

## 2024-01-03 LAB — BASIC METABOLIC PANEL WITH GFR
Anion gap: 11 (ref 5–15)
BUN: 54 mg/dL — ABNORMAL HIGH (ref 8–23)
CO2: 26 mmol/L (ref 22–32)
Calcium: 9.7 mg/dL (ref 8.9–10.3)
Chloride: 97 mmol/L — ABNORMAL LOW (ref 98–111)
Creatinine, Ser: 2.7 mg/dL — ABNORMAL HIGH (ref 0.44–1.00)
GFR, Estimated: 17 mL/min — ABNORMAL LOW
Glucose, Bld: 241 mg/dL — ABNORMAL HIGH (ref 70–99)
Potassium: 4 mmol/L (ref 3.5–5.1)
Sodium: 134 mmol/L — ABNORMAL LOW (ref 135–145)

## 2024-01-03 LAB — MAGNESIUM: Magnesium: 2.1 mg/dL (ref 1.7–2.4)

## 2024-01-03 LAB — GLUCOSE, CAPILLARY
Glucose-Capillary: 218 mg/dL — ABNORMAL HIGH (ref 70–99)
Glucose-Capillary: 199 mg/dL — ABNORMAL HIGH (ref 70–99)

## 2024-01-03 MED ORDER — DOXYCYCLINE HYCLATE 100 MG PO TABS
100.0000 mg | ORAL_TABLET | Freq: Two times a day (BID) | ORAL | 0 refills | Status: AC
  Filled 2024-01-03: qty 2, 1d supply, fill #0

## 2024-01-03 NOTE — Assessment & Plan Note (Deleted)
 On 0.5L Nasal cannula overnight but satting well above her 88-92% O2 goal and would be better off on room air. Have asked to wean to room air previously based on O2 goal and turned off her oxygen  yesterday with good maintenance of oxygen  saturations.   - continue doxycycline  (12/16-12/20) - last day of PO Prednisone  40 mg is today (12/15-12/19) - continue duoneb PRN, which she did not request yesterday  - breztri  inhaler BID - Wean O2 as able, previously on RA-2L  - Goal O2 88-92

## 2024-01-03 NOTE — Assessment & Plan Note (Deleted)
 CKDIV - continue home sodium bicarb pAF -  continue home eliquis , metoprolol  Schizophrenia - continue home olanzapine , depakote  Gout - continue home allopurinol  HTN - continue home amlodipine  OSA - offer CPAP at bedtime, though she insists she will not wear this

## 2024-01-03 NOTE — Assessment & Plan Note (Deleted)
 Glucose 159-244. Fasting this morning, 218. Received 17 units aspart and 8 units glargine yesterday.  - Increase basal insulin  to 10 units. - Continue linagliptin  - continue ACHS CBG - continue moderate SSI and HS correction

## 2024-01-03 NOTE — Assessment & Plan Note (Deleted)
 K ***. Weight down 5 kg, net I/O -3L yesterday.  - Continue PO lasix  80 mg, which is last discharge home dose - K>4, Mg>2 - strict I/O, daily weights

## 2024-01-03 NOTE — Care Management Important Message (Signed)
 Important Message  Patient Details  Name: Abigail Wallace MRN: [id number] Date of Birth: 1945-03-14   Important Message Given:  Yes - Medicare IM     [name] 01/03/2024, 11:10 AM

## 2024-01-03 NOTE — Plan of Care (Signed)
  Problem: Metabolic: Goal: Ability to maintain appropriate glucose levels will improve Outcome: Progressing   Problem: Clinical Measurements: Goal: Will remain free from infection Outcome: Progressing   Problem: Pain Managment: Goal: General experience of comfort will improve and/or be controlled Outcome: Progressing

## 2024-01-03 NOTE — Telephone Encounter (Signed)
 Son returns call to nurse line regarding concerns with proceeding with discharge today.   He would like to speak with Dr. Orie when she is available as upcoming discharge plan has not been discussed with him.   Patient will be available all day. CB number is [phone].  [name], RN

## 2024-01-03 NOTE — TOC Transition Note (Addendum)
 Transition of Care [facility]) - Discharge Note   Patient Details  Name: Abigail Wallace MRN: [id number] Date of Birth: 05/31/1945  Transition [facility] CM/SW Contact:  Waddell Barnie Rama, RN Phone Number: 01/03/2024, 10:16 AM   Clinical Narrative:    NCM spoke with son, [name] informed him of the [facility] transport time of 5 pm, he states he is good with that time.  Pam also states she will be good to go at that time as well.    1622- NCM notified [name] that [facility] is here to transport her home.   Final next level of care: Home w Home Health Services Barriers to Discharge: Continued Medical Work up   Patient Goals and CMS Choice Patient states their goals for this hospitalization and ongoing recovery are:: return home with [facility] CMS Medicare.gov Compare Post Acute Care list provided to:: Patient Choice offered to / list presented to : Patient      Discharge Placement                       Discharge Plan and Services Additional resources added to the After Visit Summary for   In-house Referral: NA Discharge Planning Services: CM Consult Post Acute Care Choice: Home Health          DME Arranged: N/A DME Agency: NA       HH Arranged: RN HH Agency:  [employer]film[employer]video) Date HH Agency Contacted: 01/02/24 Time HH Agency Contacted: 1337 Representative spoke with at [facility] Agency: Jon  Social Drivers of Health (SDOH) Interventions SDOH Screenings   Food Insecurity: No Food Insecurity (01/01/2024)  Housing: Low Risk (01/01/2024)  Transportation Needs: No Transportation Needs (01/01/2024)  Utilities: Not At Risk (01/01/2024)  Alcohol  Screen: Low Risk (12/27/2020)  Depression (PHQ2-9): High Risk (09/30/2023)  Financial Resource Strain: Low Risk (03/23/2022)  Physical Activity: Inactive (12/27/2020)  Social Connections: Socially Isolated (01/01/2024)  Stress: No Stress Concern Present (12/27/2020)  Tobacco Use: Medium Risk (12/20/2023)     Readmission Risk  Interventions    01/02/2024    1:34 PM 12/20/2023    2:55 PM 11/26/2023   11:22 AM  Readmission Risk Prevention Plan  Transportation Screening Complete Complete Complete  PCP or Specialist Appt within 3-5 Days   Complete  HRI or Home Care Consult   Complete  Social Work Consult for Recovery Care Planning/Counseling   Complete  Palliative Care Screening   Not Applicable  Medication Review [employer]) Complete Complete Referral to Pharmacy  PCP or Specialist appointment within 3-5 days of discharge  Complete   HRI or Home Care Consult Complete Complete   Palliative Care Screening Not Applicable Not Applicable   Skilled Nursing Facility Not Applicable

## 2024-01-03 NOTE — Discharge Summary (Cosign Needed Addendum)
 "  Family Medicine Teaching [facility] Discharge Summary  Patient name: Abigail Wallace [facility] Medical record number: [id number] Date of birth: 1945/12/10 Age: 78 y.o. Gender: female Date of Admission: 12/30/2023  Date of Discharge: 01/03/24 Admitting Physician: Twyla Nearing, MD  Primary Care Provider: Delores Suzann HERO, MD Consultants: None  Indication for Hospitalization: shortness of breath  Discharge Diagnoses/Problem List:  Active Problems:   COPD exacerbation (HCC)   CHF exacerbation (HCC)   T2DM (type 2 diabetes mellitus) (HCC)   Acute-on-chronic kidney injury   Infected pacemaker   Acute hypoxic respiratory failure (HCC)   RSV infection  Brief Hospital Course:  This is an 78 year old female with PMH of afib on eliquis , CHF, CKD, COPD, CAD, HTN, T2DM, with pacemaker, schizophrenia who was admitted to the [facility] family medicine teaching service with CHF and COPD exacerbations in the setting of RSV.  Her hospital course is detailed below:  COPD exacerbation RSV infection Patient initially received a dose of IV Solu-Medrol  in the ED. on admission, prednisone  was not continued initially due to history of steroid-induced psychosis.  She previously tolerated a 5-day course of steroids during her last admission without psychosis, and after shared decision making with patient's son, elected to complete a 5 day course of prednisone  (12/15-12/19).  Doxycycline  100 mg twice daily to complete a 5-day course for COPD exacerbation was administered (12/16-12/20). Scheduled duonebs administered and home breztri  inhaler continued. Patient initially with an oxygen  requirement, and received up to 6L [place], though oxygen  saturations maintained above goal 88-92% on this amount. By 12/18, patient was able to wean to room air while maintaining saturations above goal.   CHF exacerbation Patient discharged from last hospitalization on Lasix  80 mg daily, which she had been compliant with.  Volume up  on admission and patient diuresed with IV Lasix , initially 40 mg twice daily, with good response. Transitioned to po lasix  80 mg daily with continued euvolemia.   AKI Creatinine elevated at 3.3 on admission from baseline of 2.7.  Suspected prerenal in the setting of CHF exacerbation.  Patient diuresed as above and creatinine improved to 2.70 at time of discharge.  PCP Recommendations: Encourage nighttime CPAP use Evaluate diuresis regimen    Results/Tests Pending at Time of Discharge:  Unresulted Labs (From admission, onward)    None        Disposition: home   Discharge Condition: stable  Discharge Exam:  Vitals:   01/03/24 0745 01/03/24 1055  BP: (!) 157/81 (!) 145/69  Pulse: 87 69  Resp: 20 20  Temp: 97.7 F (36.5 C) 98 F (36.7 C)  SpO2: 96% 95%   General: Well-appearing female, sleeping comfortably in bed, no acute distress CV: Regular rate and rhythm, no JVD, trace pitting edema at the ankles, no edema in the mid shins, no abdominal ascites Pulm: No increased work of breathing on room air, faint expiratory wheeze Abdomen: Soft, obese, nontender  Significant Procedures: None  Significant Labs and Imaging:  No results for input(s): WBC, HGB, HCT, PLT in the last 48 hours. Recent Labs  Lab 01/02/24 0550 01/03/24 0500  NA 138 134*  K 3.3* 4.0  CL 103 97*  CO2 26 26  GLUCOSE 206* 241*  BUN 44* 54*  CREATININE 2.41* 2.70*  CALCIUM  8.2* 9.7  MG 2.0 2.1   DG Chest 1 View Result Date: 12/30/2023 CLINICAL DATA:  Shortness of breath. EXAM: CHEST  1 VIEW COMPARISON:  Chest radiograph dated 12/20/2023. FINDINGS: Evaluation is limited due  to body habitus and patient rotation. Right sided central venous line with tip in the region of the cavoatrial junction. Cardiomegaly with vascular congestion. No large pleural effusion or pneumothorax. Atherosclerotic calcification of the aorta. Left pectoral pacemaker device. No acute osseous pathology. IMPRESSION:  Cardiomegaly with vascular congestion. Electronically Signed   By: Vanetta Chou M.D.   On: 12/30/2023 15:26    Discharge Medications:  Allergies as of 01/03/2024       Reactions   Abilify [aripiprazole] Anaphylaxis, Shortness Of Breath, Other (See Comments)   Tremors, also   Keflex  [cephalexin ] Swelling, Other (See Comments)   Tongue swelling   Versacloz  [clozapine ] Anaphylaxis   Ativan  [lorazepam ] Other (See Comments)   Unknown reaction   Benadryl [diphenhydramine Hcl] Other (See Comments)   Vision changes   Benztropine  Other (See Comments)   Mobility issues   Latuda [lurasidone Hcl] Other (See Comments)   Tremors   Remeron [mirtazapine] Other (See Comments)   Tremors   Haldol  [haloperidol  Lactate] Other (See Comments)   Tardive dyskinesias  Tremors Somnolence Speech changes Sialorrhea    Codeine Other (See Comments)   Unknown reaction   Latex Rash        Medication List     TAKE these medications    Accu-Chek Guide Test test strip Generic drug: glucose blood Use to check blood glucose once daily and as needed for symptoms   acetaminophen  500 MG tablet Commonly known as: TYLENOL  Take 500-1,000 mg by mouth every 6 (six) hours as needed for mild pain (pain score 1-3) or headache.   albuterol  108 (90 Base) MCG/ACT inhaler Commonly known as: VENTOLIN  HFA Inhale 2 puffs into the lungs every 6 (six) hours as needed for wheezing or shortness of breath.   allopurinol  100 MG tablet Commonly known as: ZYLOPRIM  Take 0.5 tablets (50 mg total) by mouth every other day. What changed: when to take this   amLODipine  10 MG tablet Commonly known as: NORVASC  Take 1 tablet (10 mg total) by mouth at bedtime.   apixaban  2.5 MG Tabs tablet Commonly known as: ELIQUIS  Take 1 tablet (2.5 mg total) by mouth 2 (two) times daily.   cholecalciferol  25 MCG (1000 UNIT) tablet Commonly known as: VITAMIN D3 Take 1 tablet (1,000 Units total) by mouth daily.   DESITIN EX Apply  1 Application topically as needed.   diclofenac  Sodium 1 % Gel Commonly known as: Voltaren  Apply 2 g topically 4 (four) times daily as needed (pain).   divalproex  250 MG DR tablet Commonly known as: DEPAKOTE  Take 1 tablet (250 mg total) by mouth every 12 (twelve) hours.   doxycycline  100 MG tablet Commonly known as: VIBRA -TABS Take 1 tablet (100 mg total) by mouth every 12 (twelve) hours for 1 day.   freestyle lancets Use to check blood glucose once daily and as needed for symptoms   furosemide  40 MG tablet Commonly known as: Lasix  Take 2 tablets (80 mg total) by mouth daily.   ipratropium-albuterol  0.5-2.5 (3) MG/3ML Soln Commonly known as: DUONEB Take 3 mLs by nebulization every 4 (four) hours as needed. What changed:  when to take this additional instructions   loperamide  2 MG capsule Commonly known as: IMODIUM  Take 1 capsule (2 mg total) by mouth as needed for diarrhea or loose stools.   metoprolol  tartrate 100 MG tablet Commonly known as: LOPRESSOR  Take 1 tablet (100 mg total) by mouth 3 (three) times daily.   Multivitamin Women 50+ Tabs Take 1 tablet by mouth daily with breakfast.  nitroGLYCERIN  0.4 MG SL tablet Commonly known as: NITROSTAT  Place 1 tablet (0.4 mg total) under the tongue every 5 (five) minutes as needed for chest pain.   nystatin  powder Commonly known as: nystatin  Apply 1 Application topically daily. Apply after bathing. What changed:  when to take this additional instructions   nystatin  cream Commonly known as: MYCOSTATIN  Apply 1 Application topically daily as needed (Rash). What changed:  when to take this additional instructions   OLANZapine  10 MG tablet Commonly known as: ZYPREXA  Take 15 mg by mouth at bedtime.   OVER THE COUNTER MEDICATION Apply 1 application  topically See admin instructions. Caldesene Medicated Protecting Powder- Apply under the breasts and between the skin folds 2 times a day as needed for irritation    oxyCODONE  5 MG immediate release tablet Commonly known as: Roxicodone  Take 0.5 tablets (2.5 mg total) by mouth every 12 (twelve) hours as needed for severe pain (pain score 7-10).   penicillin  G IVPB Inject 12 Million Units into the vein daily. Indication:  Streptococcal mitis/oralis bacteremia First Dose: Yes Last Day of Therapy:  02/01/2024 Labs - Once weekly:  CBC/D and BMP, Labs - Once weekly: ESR and CRP Infuse 12 million units IV daily as a continuous infusion Method of administration: Elastomeric (Continuous infusion) Method of administration may be changed at the discretion of home infusion pharmacist based upon assessment of the patient and/or caregiver's ability to self-administer the medication ordered.   polycarbophil 625 MG tablet Commonly known as: FIBERCON Take 1 tablet (625 mg total) by mouth daily. What changed:  when to take this reasons to take this   pravastatin  40 MG tablet Commonly known as: PRAVACHOL  Take 1 tablet (40 mg total) by mouth every evening. What changed: when to take this   sodium bicarbonate  650 MG tablet Take 1 tablet (650 mg total) by mouth 3 (three) times daily.   Tradjenta  5 MG Tabs tablet Generic drug: linagliptin  Take 1 tablet (5 mg total) by mouth daily.   Trelegy Ellipta  100-62.5-25 MCG/ACT Aepb Generic drug: Fluticasone -Umeclidin-Vilant Inhale 1 puff into the lungs daily.   triamcinolone  ointment 0.5 % Commonly known as: KENALOG  Apply 1 Application topically 2 (two) times daily as needed (skin irritation).               Discharge Care Instructions  (From admission, onward)           Start     Ordered   01/02/24 0000  Change dressing on IV access line weekly and PRN  (Home infusion instructions - [facility] )        01/02/24 1357            Discharge Instructions: Please refer to Patient Instructions section of EMR for full details.  Patient was counseled important signs and symptoms that should  prompt return to medical care, changes in medications, dietary instructions, activity restrictions, and follow up appointments.   Follow-Up Appointments: 01/14/24 with Dr. Delores Alena Morrison, Elio, MD 01/03/2024, 1:28 PM PGY-1, [facility]  "

## 2024-01-03 NOTE — Telephone Encounter (Signed)
 Received after hours call from [name]. Reports that pt was still very phlegmy and wet sounding once arriving home. She is satting 92% on RA. Provided reassurance that she may continue to have wetter coughs in setting of RSV for the next few days. Advised that her O2 sat is good, but they can put her on O2 for comfort if they want, advised to keep the O2 level lower if possible.

## 2024-01-03 NOTE — Telephone Encounter (Signed)
"  Call returned to pt son. All questions answered. Agreed to DC today. "

## 2024-01-03 NOTE — Discharge Instructions (Addendum)
 Thank you for letting us  care for you during your stay.  You were admitted to the [facility] Medicine Teaching Service.   You were admitted for trouble breathing because of an RSV infection. The RSV infection triggered a COPD exacerbation for which you were treated with steroids, last dose was given today, 12/19, and doxycycline  - the last dose will be tomorrow, 12/20.   Additionally, we gave you some IV lasix  for a couple days during the admission, but you have been on your home dose of lasix  (80 mg once daily) for several days now. Continue this dose after discharge.   Please follow up with Dr. Delores on 01/14/2024 at 8:30 AM at the [facility].   If your symptoms worsen or return, please return to the hospital.  Please let us  know if you have questions about your stay at [facility].

## 2024-01-03 NOTE — Assessment & Plan Note (Deleted)
 Cr today.

## 2024-01-04 ENCOUNTER — Other Ambulatory Visit (HOSPITAL_COMMUNITY): Payer: Self-pay

## 2024-01-04 ENCOUNTER — Telehealth: Payer: Self-pay | Admitting: Family Medicine

## 2024-01-04 LAB — CULTURE, BLOOD (ROUTINE X 2): Culture: NO GROWTH

## 2024-01-04 NOTE — Telephone Encounter (Signed)
 RN Team- please call [name] and ask for BMP and CBC  on Monday/Tuesday of this week (12/22-23).  Please also advise [name] that she needs her Foley changed the week of 01/20/23.    Including Dr. Donzetta as she is covering this week.   Suzann Daring, MD  Family Medicine Teaching Service

## 2024-01-05 LAB — CULTURE, BLOOD (ROUTINE X 2)
Culture: NO GROWTH
Special Requests: ADEQUATE

## 2024-01-06 ENCOUNTER — Telehealth: Payer: Self-pay

## 2024-01-06 NOTE — Telephone Encounter (Signed)
 Called [name] and spoke with [name].   Provided verbal orders and advised of foley change date.   Abigail JAYSON English, RN

## 2024-01-06 NOTE — Transitions of Care (Post Inpatient/ED Visit) (Addendum)
 Today's TOC FU Call Status: Today's TOC FU Call Status:: Successful TOC FU Call Completed TOC FU Call Complete Date: 01/06/24  Patient's Name and Date of Birth confirmed. Name, DOB  Transition Care Management Follow-up Telephone Call Date of Discharge: 01/03/24 Discharge Facility: [facility]) Type of Discharge: Inpatient Admission Primary Inpatient Discharge Diagnosis:: RSV with Acute Resp failure with hypoxia How have you been since you were released from the hospital?: Better Any questions or concerns?: No Patient Questions/Concerns:: Patient son as informant has concerns about patient being discharged too early but was dealing with it. Stated AVS was incomplete  and that home IV infusion contact was not adequately addressed prior to discharge. It was a resumption of care from previous discharge and son has been on contact with [facility], had medication left and was receiving a delivery today. Questions concerning home health resumption as has not yet heard from [facility] but has contact number and will reach out to them after 10 am as he stated he knew from prior engagement with agency that 9-10 was their meeting in house prior to outreach to patient. Home Health RN to evaluate and change dressings to pacemaker area wound if still required and PICC IV access for home infusion of antibiotic. Son has been taking care of his mother for 12 years and was at work during this call, he understood DC instructions though AVS was incomplete per son stated. Has PCP follow up 01/14/24 with [facility]bigail Wallace who is coming to see patient in her home. Patient Questions/Concerns Addressed: Other: (Listened to patient, addressed concerns, gave son Patient Experience number of which he already had to call. Confirmed son has all needed contact information, medications, follow up appointments. Son stated patient is doing okay,good spirits. SpO2 90's%.)  Items Reviewed: Did you receive and understand the discharge  instructions provided?: Yes Medications obtained,verified, and reconciled?: Yes (Medications Reviewed) Any new allergies since your discharge?: No Dietary orders reviewed?: NA Do you have support at home?: Yes People in Home [RPT]: child(ren), adult Name of Support/Comfort Primary Source: Abigail Wallace, Emergency Contact  [phone] (Mobile)  Medications Reviewed Today: Medications Reviewed Today   Medications were not reviewed in this encounter     Home Care and Equipment/Supplies: Were Home Health Services Ordered?: Yes Name of Home Health Agency:: [name] Has Agency set up a time to come to your home?: No (Resumption of care in progress, Son has contact number.) First Home Health Visit Date:  (Home Health was started on 12/27/23 on last discharge and prior to this admission and was ordered on this discharge for resumption of care. Was seen initially on 12/27/23.) Any new equipment or medical supplies ordered?: No  Functional Questionnaire: Do you need assistance with bathing/showering or dressing?: Yes (Patient is mostly bed bound, at home with son Abigail Wallace, 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 Also has [facility] home aide Monday-Friday 7 am-3pm and 3 pm -7 pm. Obtains diapers from [employer] and has home O2/[employer].) Do you need assistance with meal preparation?: Yes Do you need assistance with eating?: No Do you have difficulty maintaining continence: No Do you need assistance with getting out of bed/getting out of a chair/moving?: Yes (Patient is mostly bed bound, at home with son Abigail Wallace, 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 Also has [facility] home aide Monday-Friday 7 am-3pm and 3 pm -7 pm.  Obtains diapers from [employer] and has home O2/[employer].) Do you have difficulty managing or taking your medications?: Yes (Son manages medications for patient including IV  infusions)  Follow up appointments reviewed: PCP Follow-up appointment confirmed?: Yes Date of PCP follow-up appointment?: 01/14/24 Follow-up Provider: Dr Abigail Wallace will visit patient in her home. Specialist Hospital Follow-up appointment confirmed?: Yes (Will be a televisit by infectious disease provider.) Date of Specialist follow-up appointment?: 01/13/24 Follow-Up Specialty Provider:: Abigail Orem MD Do you need transportation to your follow-up appointment?: No Do you understand care options if your condition(s) worsen?: Yes-patient verbalized understanding (Per son as informant, reviewed with son.)  SDOH Interventions Today    Flowsheet Row Most Recent Value  SDOH Interventions   Food Insecurity Interventions Intervention Not Indicated  Housing Interventions Intervention Not Indicated  Transportation Interventions Inpatient TOC, PTAR [facility]), Patient Resources (Friends/Family)  Utilities Interventions Intervention Not Indicated   SDOH Interventions    Flowsheet Row Telephone from 01/06/2024 in [place] POPULATION HEALTH DEPARTMENT Telephone from 12/30/2023 in [place] POPULATION HEALTH DEPARTMENT ED to Hosp-Admission (Discharged) from 11/24/2023 in [facility] 3E HF PCU Telephone from 09/30/2023 in [place] POPULATION HEALTH DEPARTMENT Telephone from 02/15/2023 in [place] POPULATION HEALTH DEPARTMENT Telephone from 03/23/2022 in [facility] [address]  SDOH Interventions        Food Insecurity Interventions Intervention Not Indicated Intervention Not Indicated -- Intervention Not Indicated Intervention Not Indicated Intervention Not Indicated  Housing Interventions Intervention Not Indicated Intervention Not Indicated -- Intervention Not Indicated Intervention Not Indicated Intervention Not Indicated  Transportation Interventions Inpatient TOC, PTAR [phone]), Patient Resources (Friends/Family)  Inpatient TOC, PTAR ([facility]) Inpatient TOC, PTAR [facility]) Intervention Not Indicated, Patient Resources (Friends/Family) Intervention Not Indicated Patient Resources (Friends/Family), Payor Benefit, Other (Comment)  [mailed additional resources and dicussed what to do if Medicaid rides fall through]  Utilities Interventions Intervention Not Indicated Intervention Not Indicated -- Intervention Not Indicated Intervention Not Indicated Intervention Not Indicated  Depression Interventions/Treatment  -- -- -- Medication -- --  Financial Strain Interventions -- -- -- -- -- Intervention Not Indicated    NOTE: 01/06/24  01/06/24: Successful outreach with patient son as caregiver and informant for this call.  Patient' son, Abigail Wallace, declined follow up call/30Day program at this time.  Abigail Wallace stated he did appreciate this follow up call due to concerns related to discharge.  Patient as discharged on previously on 12/27/23 with a readmission on 12/30/23 for RSV with acute respiratory failure and hypoxia, was receiving home infusions prior to this admissions for pacemaker infection and is being followed by infectious disease provider with a follow up on 01/14/24.  PCP follow up on 01/14/24 with an in-home visit by Pcp.  Son had concerns regarding discharge events and was referred to patient experience contact number. Has all needed DME at home, oxygen  per [employer], [facility] home Infusions, Home health via [place], son has all contact number to address issues/needs. Waiting to hear from [facility] after 1000 am today.  Obtained all medications, understands DC instructions and what to contact providers for.    The patient is directed to their insurance card regarding availability of benefits coverage     Bing Edison MSN, RN RN Case Manager [facility]  VBCI-Population Health Office Hours M-F [phone] Direct Dial: [phone] Main Phone [phone]  Fax:  [phone] [email or web address]

## 2024-01-06 NOTE — Telephone Encounter (Signed)
 Patients son calls nurse in regards to [facility].   He reports he will be faxing over FMLA forms for PCP to fill out.   Fax number given.   Discussed with [name] to call me back tomorrow to confirm we received the forms.

## 2024-01-07 ENCOUNTER — Encounter: Payer: Self-pay | Admitting: Family Medicine

## 2024-01-07 NOTE — Telephone Encounter (Signed)
 Sent mychart message to clarfiy time off needed, working on form.

## 2024-01-07 NOTE — Telephone Encounter (Signed)
 Patient called and informed that forms are ready for pick up. Copy made and placed in batch scanning. Original placed at front desk for pick up.   Veronda Prude, RN

## 2024-01-07 NOTE — Telephone Encounter (Signed)
 Placed FMLA forms in [employer]

## 2024-01-07 NOTE — Telephone Encounter (Signed)
 Patient's son calls nurse line to confirm that we received the paperwork.   Paperwork is in PCP box for completion.   He is requesting paperwork as soon as possible.   Advised that Dr. Delores is out of the office this week and that I would send her and covering provider message.   [name], RN

## 2024-01-08 NOTE — Telephone Encounter (Signed)
 See other mychart message.

## 2024-01-10 ENCOUNTER — Telehealth: Payer: Self-pay | Admitting: Family Medicine

## 2024-01-10 NOTE — Telephone Encounter (Signed)
**  After Hours/ Emergency Line Call**  Received a page to call [phone]) - [phone].  Patient: [facility]ail [facility]  Caller: Son [name] who is on his way home right now and not currently with the patient Confirmed name & DOB of patient with caller  Subjective:     Son calling to report congestion and wheezing ongoing since hospital discharge. Not febrile. No acute CP, SOB, vomiting. He checked her oxygen  earlier today and it was 93% She has been using about 1.5L oxygen . When off oxygen , sometimes goes into the mid-80s.  He expresses frustration with her recently being discharged yet continuing to have symptoms  She is currently using albuterol  neb about 3 times daily Taking trelegy and lasix  as prescribed. He is unsure if she has worsening fluid/edema  He reports that the patient is extremely unwilling to go back to the hospital and he would prefer to respect her wishes  Objective:  Observations: Patient not present  Assessment & Plan  Abigail Wallace is a 78 y.o. female with PMHx s/f PMH of afib on eliquis , CHF, CKD, COPD, CAD, HTN, T2DM, with pacemaker, schizophrenia. Her son is calling on her behalf with regards to continued congestion and wheezing since discharge 12/19  Congestion/wheezing Given patient's significant medical history with recent hospital admission I advised that my recommendation would be for the patient to be evaluated if her symptoms are worsening  Reviewing past notes I see that she has a history of steroid induced psychosis so I am hesitant to send in a course of steroids over the phone without formal evaluation.  For now I recommended offering her nebulizer treatment every 4 hours for the next 24-48 hours and emphasized my suggestion for ED eval. Discussed strict precautions should she continue to worsen.  Son expresses his current plan is to monitor and call back on Monday. They have a home visit scheduled next week.  -- Red flags discussed.   --  Will forward to PCP.  Payton Coward, MD [facility] Family Medicine Residency, PGY-3

## 2024-01-11 ENCOUNTER — Inpatient Hospital Stay (HOSPITAL_COMMUNITY)
Admission: EM | Admit: 2024-01-11 | Discharge: 2024-01-16 | DRG: 291 | Disposition: A | Discharge status: Home Health | Attending: Family Medicine | Admitting: Family Medicine

## 2024-01-11 ENCOUNTER — Other Ambulatory Visit: Payer: Self-pay

## 2024-01-11 ENCOUNTER — Telehealth: Payer: Self-pay | Admitting: Family Medicine

## 2024-01-11 ENCOUNTER — Emergency Department (HOSPITAL_COMMUNITY)

## 2024-01-11 DIAGNOSIS — Z7951 Long term (current) use of inhaled steroids: Secondary | ICD-10-CM

## 2024-01-11 DIAGNOSIS — I4891 Unspecified atrial fibrillation: Secondary | ICD-10-CM | POA: Diagnosis present

## 2024-01-11 DIAGNOSIS — R627 Adult failure to thrive: Secondary | ICD-10-CM | POA: Diagnosis present

## 2024-01-11 DIAGNOSIS — R531 Weakness: Principal | ICD-10-CM

## 2024-01-11 DIAGNOSIS — D631 Anemia in chronic kidney disease: Secondary | ICD-10-CM | POA: Diagnosis present

## 2024-01-11 DIAGNOSIS — J441 Chronic obstructive pulmonary disease with (acute) exacerbation: Secondary | ICD-10-CM | POA: Diagnosis present

## 2024-01-11 DIAGNOSIS — R4789 Other speech disturbances: Secondary | ICD-10-CM

## 2024-01-11 DIAGNOSIS — N179 Acute kidney failure, unspecified: Secondary | ICD-10-CM | POA: Diagnosis present

## 2024-01-11 DIAGNOSIS — Z8612 Personal history of poliomyelitis: Secondary | ICD-10-CM

## 2024-01-11 DIAGNOSIS — E872 Acidosis, unspecified: Secondary | ICD-10-CM | POA: Diagnosis present

## 2024-01-11 DIAGNOSIS — E1122 Type 2 diabetes mellitus with diabetic chronic kidney disease: Secondary | ICD-10-CM | POA: Diagnosis present

## 2024-01-11 DIAGNOSIS — J205 Acute bronchitis due to respiratory syncytial virus: Secondary | ICD-10-CM | POA: Diagnosis present

## 2024-01-11 DIAGNOSIS — R7881 Bacteremia: Secondary | ICD-10-CM | POA: Diagnosis present

## 2024-01-11 DIAGNOSIS — N390 Urinary tract infection, site not specified: Secondary | ICD-10-CM | POA: Diagnosis present

## 2024-01-11 DIAGNOSIS — I495 Sick sinus syndrome: Secondary | ICD-10-CM | POA: Diagnosis present

## 2024-01-11 DIAGNOSIS — E785 Hyperlipidemia, unspecified: Secondary | ICD-10-CM | POA: Diagnosis present

## 2024-01-11 DIAGNOSIS — I130 Hypertensive heart and chronic kidney disease with heart failure and stage 1 through stage 4 chronic kidney disease, or unspecified chronic kidney disease: Principal | ICD-10-CM | POA: Diagnosis present

## 2024-01-11 DIAGNOSIS — E860 Dehydration: Secondary | ICD-10-CM | POA: Diagnosis present

## 2024-01-11 DIAGNOSIS — Z888 Allergy status to other drugs, medicaments and biological substances status: Secondary | ICD-10-CM

## 2024-01-11 DIAGNOSIS — Z79899 Other long term (current) drug therapy: Secondary | ICD-10-CM

## 2024-01-11 DIAGNOSIS — Y831 Surgical operation with implant of artificial internal device as the cause of abnormal reaction of the patient, or of later complication, without mention of misadventure at the time of the procedure: Secondary | ICD-10-CM | POA: Diagnosis present

## 2024-01-11 DIAGNOSIS — Z9981 Dependence on supplemental oxygen: Secondary | ICD-10-CM

## 2024-01-11 DIAGNOSIS — E65 Localized adiposity: Secondary | ICD-10-CM | POA: Diagnosis present

## 2024-01-11 DIAGNOSIS — D649 Anemia, unspecified: Secondary | ICD-10-CM | POA: Diagnosis present

## 2024-01-11 DIAGNOSIS — R5383 Other fatigue: Secondary | ICD-10-CM | POA: Insufficient documentation

## 2024-01-11 DIAGNOSIS — R0609 Other forms of dyspnea: Secondary | ICD-10-CM

## 2024-01-11 DIAGNOSIS — R0902 Hypoxemia: Secondary | ICD-10-CM | POA: Diagnosis present

## 2024-01-11 DIAGNOSIS — R0600 Dyspnea, unspecified: Secondary | ICD-10-CM | POA: Diagnosis present

## 2024-01-11 DIAGNOSIS — Z634 Disappearance and death of family member: Secondary | ICD-10-CM

## 2024-01-11 DIAGNOSIS — Z950 Presence of cardiac pacemaker: Secondary | ICD-10-CM

## 2024-01-11 DIAGNOSIS — Z792 Long term (current) use of antibiotics: Secondary | ICD-10-CM

## 2024-01-11 DIAGNOSIS — J440 Chronic obstructive pulmonary disease with acute lower respiratory infection: Secondary | ICD-10-CM | POA: Diagnosis present

## 2024-01-11 DIAGNOSIS — Z91040 Latex allergy status: Secondary | ICD-10-CM

## 2024-01-11 DIAGNOSIS — Z6841 Body Mass Index (BMI) 40.0 and over, adult: Secondary | ICD-10-CM

## 2024-01-11 DIAGNOSIS — T827XXA Infection and inflammatory reaction due to other cardiac and vascular devices, implants and grafts, initial encounter: Secondary | ICD-10-CM | POA: Diagnosis present

## 2024-01-11 DIAGNOSIS — Z885 Allergy status to narcotic agent status: Secondary | ICD-10-CM

## 2024-01-11 DIAGNOSIS — I5023 Acute on chronic systolic (congestive) heart failure: Secondary | ICD-10-CM | POA: Diagnosis present

## 2024-01-11 DIAGNOSIS — Z7901 Long term (current) use of anticoagulants: Secondary | ICD-10-CM

## 2024-01-11 DIAGNOSIS — Z789 Other specified health status: Secondary | ICD-10-CM

## 2024-01-11 DIAGNOSIS — R062 Wheezing: Secondary | ICD-10-CM

## 2024-01-11 DIAGNOSIS — B954 Other streptococcus as the cause of diseases classified elsewhere: Secondary | ICD-10-CM | POA: Diagnosis present

## 2024-01-11 DIAGNOSIS — E669 Obesity, unspecified: Secondary | ICD-10-CM | POA: Diagnosis present

## 2024-01-11 DIAGNOSIS — F200 Paranoid schizophrenia: Secondary | ICD-10-CM | POA: Diagnosis present

## 2024-01-11 DIAGNOSIS — I2510 Atherosclerotic heart disease of native coronary artery without angina pectoris: Secondary | ICD-10-CM | POA: Diagnosis present

## 2024-01-11 DIAGNOSIS — R300 Dysuria: Secondary | ICD-10-CM | POA: Diagnosis present

## 2024-01-11 DIAGNOSIS — R338 Other retention of urine: Secondary | ICD-10-CM | POA: Diagnosis present

## 2024-01-11 DIAGNOSIS — N184 Chronic kidney disease, stage 4 (severe): Secondary | ICD-10-CM

## 2024-01-11 DIAGNOSIS — M109 Gout, unspecified: Secondary | ICD-10-CM | POA: Diagnosis present

## 2024-01-11 LAB — CBC WITH DIFFERENTIAL/PLATELET
Abs Immature Granulocytes: 0.2 K/uL — ABNORMAL HIGH (ref 0.00–0.07)
Basophils Absolute: 0.1 K/uL (ref 0.0–0.1)
Basophils Relative: 1 %
Eosinophils Absolute: 0.1 K/uL (ref 0.0–0.5)
Eosinophils Relative: 1 %
HCT: 30.5 % — ABNORMAL LOW (ref 36.0–46.0)
Hemoglobin: 9.4 g/dL — ABNORMAL LOW (ref 12.0–15.0)
Lymphocytes Relative: 15 %
Lymphs Abs: 1.5 K/uL (ref 0.7–4.0)
MCH: 30 pg (ref 26.0–34.0)
MCHC: 30.8 g/dL (ref 30.0–36.0)
MCV: 97.4 fL (ref 80.0–100.0)
Metamyelocytes Relative: 2 %
Monocytes Absolute: 0.2 K/uL (ref 0.1–1.0)
Monocytes Relative: 2 %
Neutro Abs: 8.1 K/uL — ABNORMAL HIGH (ref 1.7–7.7)
Neutrophils Relative %: 79 %
Platelets: 148 K/uL — ABNORMAL LOW (ref 150–400)
RBC: 3.13 MIL/uL — ABNORMAL LOW (ref 3.87–5.11)
RDW: 15 % (ref 11.5–15.5)
WBC: 10.2 K/uL (ref 4.0–10.5)
nRBC: 0 % (ref 0.0–0.2)

## 2024-01-11 LAB — URINALYSIS, W/ REFLEX TO CULTURE (INFECTION SUSPECTED)
Bilirubin Urine: NEGATIVE
Glucose, UA: 150 mg/dL — AB
Hgb urine dipstick: NEGATIVE
Ketones, ur: NEGATIVE mg/dL
Nitrite: NEGATIVE
Protein, ur: 30 mg/dL — AB
Specific Gravity, Urine: 1.009 (ref 1.005–1.030)
pH: 6 (ref 5.0–8.0)

## 2024-01-11 LAB — RESP PANEL BY RT-PCR (RSV, FLU A&B, COVID)  RVPGX2
Influenza A by PCR: NEGATIVE
Influenza B by PCR: NEGATIVE
Resp Syncytial Virus by PCR: POSITIVE — AB
SARS Coronavirus 2 by RT PCR: NEGATIVE

## 2024-01-11 LAB — CBG MONITORING, ED: Glucose-Capillary: 188 mg/dL — ABNORMAL HIGH (ref 70–99)

## 2024-01-11 LAB — I-STAT CG4 LACTIC ACID, ED
Lactic Acid, Venous: 2.1 mmol/L (ref 0.5–1.9)
Lactic Acid, Venous: 1.3 mmol/L (ref 0.5–1.9)

## 2024-01-11 LAB — COMPREHENSIVE METABOLIC PANEL WITH GFR
ALT: 7 U/L (ref 0–44)
AST: 15 U/L (ref 15–41)
Albumin: 2.6 g/dL — ABNORMAL LOW (ref 3.5–5.0)
Alkaline Phosphatase: 63 U/L (ref 38–126)
Anion gap: 13 (ref 5–15)
BUN: 45 mg/dL — ABNORMAL HIGH (ref 8–23)
CO2: 23 mmol/L (ref 22–32)
Calcium: 9.4 mg/dL (ref 8.9–10.3)
Chloride: 100 mmol/L (ref 98–111)
Creatinine, Ser: 2.9 mg/dL — ABNORMAL HIGH (ref 0.44–1.00)
GFR, Estimated: 16 mL/min — ABNORMAL LOW
Glucose, Bld: 259 mg/dL — ABNORMAL HIGH (ref 70–99)
Potassium: 3.8 mmol/L (ref 3.5–5.1)
Sodium: 135 mmol/L (ref 135–145)
Total Bilirubin: <0.2 mg/dL (ref 0.0–1.2)
Total Protein: 5.5 g/dL — ABNORMAL LOW (ref 6.5–8.1)

## 2024-01-11 MED ORDER — OLANZAPINE 5 MG PO TABS
15.0000 mg | ORAL_TABLET | Freq: Every day | ORAL | Status: DC
  Administered 2024-01-11 – 2024-01-15 (×4): 15 mg via ORAL
  Filled 2024-01-11 (×2): qty 1
  Filled 2024-01-11: qty 2

## 2024-01-11 MED ORDER — LACTATED RINGERS IV BOLUS
500.0000 mL | Freq: Once | INTRAVENOUS | Status: AC
  Administered 2024-01-11: 500 mL via INTRAVENOUS

## 2024-01-11 MED ORDER — METHYLPREDNISOLONE SODIUM SUCC 125 MG IJ SOLR
125.0000 mg | Freq: Once | INTRAMUSCULAR | Status: AC
  Administered 2024-01-11: 125 mg via INTRAVENOUS
  Filled 2024-01-11: qty 2

## 2024-01-11 MED ORDER — ALBUTEROL SULFATE (2.5 MG/3ML) 0.083% IN NEBU
5.0000 mg | INHALATION_SOLUTION | Freq: Once | RESPIRATORY_TRACT | Status: AC
  Administered 2024-01-11: 5 mg via RESPIRATORY_TRACT
  Filled 2024-01-11: qty 6

## 2024-01-11 MED ORDER — IPRATROPIUM-ALBUTEROL 0.5-2.5 (3) MG/3ML IN SOLN
3.0000 mL | RESPIRATORY_TRACT | Status: DC
  Administered 2024-01-11 – 2024-01-12 (×6): 3 mL via RESPIRATORY_TRACT
  Filled 2024-01-11 (×7): qty 3

## 2024-01-11 MED ORDER — PENICILLIN G POTASSIUM IV (FOR PTA / DISCHARGE USE ONLY): 12.0000 10*6.[IU] | INTRAVENOUS | Status: DC

## 2024-01-11 MED ORDER — SODIUM BICARBONATE 650 MG PO TABS
650.0000 mg | ORAL_TABLET | Freq: Three times a day (TID) | ORAL | Status: DC
  Administered 2024-01-11 – 2024-01-16 (×15): 650 mg via ORAL
  Filled 2024-01-11 (×4): qty 1

## 2024-01-11 MED ORDER — AMLODIPINE BESYLATE 10 MG PO TABS
10.0000 mg | ORAL_TABLET | Freq: Every day | ORAL | Status: DC
  Administered 2024-01-12 – 2024-01-15 (×4): 10 mg via ORAL
  Filled 2024-01-11: qty 1

## 2024-01-11 MED ORDER — ALBUTEROL SULFATE (2.5 MG/3ML) 0.083% IN NEBU
5.0000 mg | INHALATION_SOLUTION | Freq: Once | RESPIRATORY_TRACT | Status: DC
  Filled 2024-01-11: qty 6

## 2024-01-11 MED ORDER — BUDESON-GLYCOPYRROL-FORMOTEROL 160-9-4.8 MCG/ACT IN AERO
2.0000 | INHALATION_SPRAY | Freq: Two times a day (BID) | RESPIRATORY_TRACT | Status: DC
  Administered 2024-01-12 – 2024-01-16 (×7): 2 via RESPIRATORY_TRACT
  Filled 2024-01-11: qty 5.9

## 2024-01-11 MED ORDER — IPRATROPIUM-ALBUTEROL 0.5-2.5 (3) MG/3ML IN SOLN
3.0000 mL | RESPIRATORY_TRACT | Status: DC
  Administered 2024-01-11: 3 mL via RESPIRATORY_TRACT
  Filled 2024-01-11: qty 3

## 2024-01-11 MED ORDER — PENICILLIN G POTASSIUM 20000000 UNITS IJ SOLR
12.0000 10*6.[IU] | INTRAVENOUS | Status: DC
  Administered 2024-01-11 – 2024-01-16 (×6): 12 10*6.[IU] via INTRAVENOUS
  Filled 2024-01-11 (×2): qty 12

## 2024-01-11 MED ORDER — ALLOPURINOL 100 MG PO TABS
50.0000 mg | ORAL_TABLET | Freq: Every morning | ORAL | Status: DC
  Administered 2024-01-12 – 2024-01-16 (×5): 50 mg via ORAL
  Filled 2024-01-11: qty 1

## 2024-01-11 MED ORDER — VITAMIN D 25 MCG (1000 UNIT) PO TABS
1000.0000 [IU] | ORAL_TABLET | Freq: Every day | ORAL | Status: DC
  Administered 2024-01-12 – 2024-01-16 (×5): 1000 [IU] via ORAL
  Filled 2024-01-11: qty 1

## 2024-01-11 MED ORDER — APIXABAN 2.5 MG PO TABS
2.5000 mg | ORAL_TABLET | Freq: Two times a day (BID) | ORAL | Status: DC
  Administered 2024-01-11 – 2024-01-16 (×10): 2.5 mg via ORAL
  Filled 2024-01-11 (×3): qty 1

## 2024-01-11 MED ORDER — INSULIN ASPART 100 UNIT/ML IJ SOLN
0.0000 [IU] | Freq: Three times a day (TID) | INTRAMUSCULAR | Status: DC
  Administered 2024-01-12 – 2024-01-14 (×5): 2 [IU] via SUBCUTANEOUS
  Administered 2024-01-14 – 2024-01-15 (×4): 1 [IU] via SUBCUTANEOUS
  Filled 2024-01-11 (×2): qty 2

## 2024-01-11 MED ORDER — METOPROLOL TARTRATE 50 MG PO TABS
100.0000 mg | ORAL_TABLET | Freq: Three times a day (TID) | ORAL | Status: DC
  Administered 2024-01-12 – 2024-01-16 (×14): 100 mg via ORAL
  Filled 2024-01-11: qty 2
  Filled 2024-01-11 (×2): qty 4

## 2024-01-11 MED ORDER — DIVALPROEX SODIUM 250 MG PO DR TAB
250.0000 mg | DELAYED_RELEASE_TABLET | Freq: Two times a day (BID) | ORAL | Status: DC
  Administered 2024-01-11 – 2024-01-16 (×9): 250 mg via ORAL
  Filled 2024-01-11 (×4): qty 1

## 2024-01-11 MED ORDER — PRAVASTATIN SODIUM 40 MG PO TABS
40.0000 mg | ORAL_TABLET | Freq: Every evening | ORAL | Status: DC
  Administered 2024-01-11 – 2024-01-16 (×6): 40 mg via ORAL
  Filled 2024-01-11 (×2): qty 1

## 2024-01-11 MED ORDER — IPRATROPIUM BROMIDE 0.02 % IN SOLN
0.5000 mg | Freq: Once | RESPIRATORY_TRACT | Status: AC
  Administered 2024-01-11: 0.5 mg via RESPIRATORY_TRACT
  Filled 2024-01-11: qty 2.5

## 2024-01-11 NOTE — Telephone Encounter (Signed)
 After hours call  Patient's son calls again to discuss his mother's condition. She continues to get worse over time. She does not act like herself. She has slurred speech (which is not new, but it has not resolved like it usually does). He states she has not improved since she left the hospital. No fevers that he has seen. He has been trying his best to give her the PCN correctly.  I spoke with [name] over the phone. She does not think she needs to come back to the hospital. However, her son notes that her home health aide drew labs for Dr. Delores on 12/24, and they showed a WBC of 15.9 (up from 8.8 about 11 days ago). I expressed my concern for possible sepsis and worsening infection.   Son Abigail Wallace will call ambulance now to bring his mother to the hospital for further evaluation.

## 2024-01-11 NOTE — H&P (Cosign Needed Addendum)
 "    Hospital Admission History and Physical Service Pager: [phone]  Patient name: Abigail [facility] Medical record number: [id number] Date of Birth: 06-Aug-1945 Age: 78 y.o. Gender: female  Primary Care Provider: Delores Suzann HERO, MD Consultants: None Code Status: Full code - confirmed with patient.  Preferred Emergency Contact:  Contact Information     Name Relation Home Work Udall Son [phone]  [phone]      Other Contacts     Name Relation Home Work Mobile   [name],[name] Son [phone]  [phone]      Chief Complaint: Tired and dyspneic   Assessment and Plan: Temprence Rhines [address] [facility] is a 78 y.o. female presenting with dyspnea and generalized fatigue likely 2/2 recent RSV infection. Less suspicious for CHF exacerbation, does not appear volume overloaded. Low suspicion for pneumonia - no fever, no leukocytosis. She has not missed doses of eliquis  - low suspicion for PE Assessment & Plan Dehydration Fatigue Dry mucous membranes and decreased PO intake at home. Reassuringly creatinine at baseline, no leukocytosis. Lactic initially 2.1 that improved. She got 500ml LR bolus in the ED along with solumedrol, duonebs with improvement. Suspect poor po intake likely related to recent RSV infection/hospitalization. Afebrile, low suspicion for pneumonia.  - Admitted to FMTS, Dr. Donah attending - Hold 80mg  Lasix  daily for now, monitor volume status - Strict I/O - am TSH Dyspnea Likely residual from recent RSV infection. She does have a left pleural effusion on CXR that appears larger than last hospitalization.  - Holding 80mg  Lasix  for now 2/2 dehydration - duoneb q4h - continue home trelegy - Wean O2 as tolerated - Strict I/O, daily weights - am BNP Dysuria Son reports cloudy urine, dysuria, and flank pain x3 days. Of note, she is on IV Penicillin  G for strep bacteremia. Notably patient has chronic foley s/t history of polio.  - pending U/A,  urine culture  Chronic and Stable Problems:  T2DM - Sensitive SSI, monitor CBGs (expect elevation from steroid administration) Strep bacteremia 2/2 infected pacemaker - continue U IVPB Pen G Gout - continue allopurinol  Afib - continue eliquis  Schizophrenia - continue depakote , olanzapine  HLD - continue pravastatin  CKD - continue sodium bicarb   FEN/GI: carb modified heart healthy  VTE Prophylaxis: on home eliquis   Indwelling foley s/t urinary retention from polio - foley changed today.   Disposition: admit to med-surg   History of Present Illness:  Abigail Wallace is a 78 y.o. female presenting with persistent cough, SOB, generalized weakness since admission. Was sent to hospital for elevated WBC outpatient on labs drawn by home health.   Patient reports she had been getting more tired and coughing more since her discharge 01/03/24. Reports she has been coughing and chest pain for a month, but she has been eating and drinking still. She says this week she has been having difficulty talking.   Spoke with son, [name], for collateral via phone. Son feels that her cough has persisted since discharge and her lungs have sounded wet since discharge. He has been giving her Safetussin at home for her cough and lung sounds. He states the urine from her foley has been cloudy. He states she has been complaining of dysuria and R flank pain x3 days. He states she is not drinking at home and has had decreased urine output.  Foley has been placed for 3 weeks, son requests it to be changed. Son states pt intermittently uses oxygen  at home. He states she  typically has more trouble with her speech when she is nervous about her breathing.   In the ED, pt is afebrile. Pt was diffusely wheezing per EDP and given duoneb, solumedrol, LR bolus 500ml.   Review Of Systems: Per HPI with the following additions: none  Pertinent Past Medical History: Afib on  Eliquis  CHF CKD3B COPD CAD GERD Gout HTN Pacemaker - sick sinus syndrome Paranoid schizophrenia T2DM  Remainder reviewed in history tab.   Pertinent Past Surgical History: Cardiac cath 2007 Pacemaker placement 2007 Cholecystectomy   Remainder reviewed in history tab.  Pertinent Social History: Tobacco use: No Alcohol  use: No Other Substance use: no Lives with son, [name]  Pertinent Family History: Mother - heart disease Father - heart disease   Remainder reviewed in history tab.   Important Outpatient Medications: Albuterol  as needed Allopurinol  50 mg every morning Amlodipine  10 mg daily Eliquis  2.5 mg twice daily Vitamin D  1000U daily Depakote  250 mg twice daily Trelegy 1 puff daily Lasix  80mg  daily Linagliptin  5mg  daily Metoprolol  tartrate 100mg  3 times daily Olanzapine  15 mg nightly Oxycodone  2.5mg  q12h PRN Penicillin  G IVPB 12 million units for strep mitis/oralis bacteremia until 02/11/24 Pravastatin  40mg  daily Sodium bicarb 650 mg 3 times daily  Remainder reviewed in medication history.   Objective: BP (!) 119/50   Pulse 76   Temp 97.7 F (36.5 C) (Oral)   Resp 18   SpO2 96%  Exam: General: chronically ill appearing, no acute distress Eyes: PERRL, EOMI ENTM: Dry mucous membranes  Cardiovascular: RRR, no murmur Respiratory: Decreased breath sounds on the left. Coarse breath sounds bilaterally. No wheezing. Normal work of breathing on RA.  Gastrointestinal: soft, non-distended MSK: Mild non-pitting edema BLE Neuro: 5/5 strength BUE and BLE.  CN2: no vision changes CN3,4,6: PERRL. EOMI CN5: Sensation intact BL CN7: Facial expressions symmetric CN8: Hearing intact BL CN9: palate symmetric  CN10: regular speech CN11: turns head against resistance CN12: tongue midline Psych: Normal affect  Labs:  CBC BMET  Recent Labs  Lab 01/11/24 1615  WBC 10.2  HGB 9.4*  HCT 30.5*  PLT 148*   Recent Labs  Lab 01/11/24 1615  NA 135  K 3.8  CL  100  CO2 23  BUN 45*  CREATININE 2.90*  GLUCOSE 259*  CALCIUM  9.4    Lactic acid 2.1 > 1.3 RSV positive  Imaging Studies Performed:  CXR 01/11/24 IMPRESSION: 1. Trace to small left pleural effusion. 2. Limited evaluation due to overlapping osseous structures and overlying soft tissues. Appears to be moderate left pleural effusion  Darrell Hauk, DO 01/11/2024, 8:37 PM PGY-2, [facility] Family Medicine  FPTS Intern pager: [phone], text pages welcome Secure chat group [facility] Teaching Service  "

## 2024-01-11 NOTE — ED Triage Notes (Signed)
 Pt BIB [facility] coming from home due to WBC of 16. PCP believes she is septic Also reports recent RSV dx.  EMS: 148 systolic 60 HR 97.8 240 CBG\ 96% 2L

## 2024-01-11 NOTE — Assessment & Plan Note (Addendum)
 Son reports cloudy urine, dysuria, and flank pain x3 days. Of note, she is on IV Penicillin  G for strep bacteremia. Notably patient has chronic foley s/t history of polio.  - pending U/A, urine culture

## 2024-01-11 NOTE — Assessment & Plan Note (Signed)
 Likely residual from recent RSV infection. She does have a left pleural effusion on CXR that appears larger than last hospitalization.  - Holding 80mg  Lasix  for now 2/2 dehydration - duoneb q4h - continue home trelegy - Wean O2 as tolerated - Strict I/O, daily weights - am BNP

## 2024-01-11 NOTE — ED Notes (Addendum)
 Patient stated she wanted to wait to change the dressing on her PICC line and her foley catheter. Will continue to monitor. Patient given graham crackers per request.

## 2024-01-11 NOTE — Hospital Course (Addendum)
 Enrika Aguado [place]ac is a 78 y.o.female with a history of CHF, Afib with pacemaker, COPD, who was admitted to the [facility] Medicine Teaching Service at [facility] for dyspnea. Her hospital course is detailed below:  Dyspnea  Patient recently discharged on 01/03/2024 continued to have worsening dyspnea at home despite increased nebulizer use. Labs by home health had leukocytosis. She had a lactic acid of 2.1 which improved after 500 cc IVF. She did not have leukocytosis on admission. RSV test still positive. CXR with evidence of moderate left sided pleural effusion similar to when she was discharged on 12/19. Lasix  was held on admission due to concern for dehydration.   Dysuria  Patient had couple days of dysuria and flank pain. Has an indwelling foley catheter due to history of polio. Foley was changed on 12/27. Urine culture ***.   Strep bacteremia 2/2 infected pacemaker  Patient recently was placed on IVPB penicillin  G after last admission for strep bacteremia. This was continued during this admission.   T2DM  Patient's blood glucose monitored during admission. She required higher doses on insulin  during her admission secondary to steroid administration. She was discharged on ***.   Other chronic conditions were medically managed with home medications and formulary alternatives as necessary (***)  PCP Follow-up Recommendations:

## 2024-01-11 NOTE — Assessment & Plan Note (Signed)
 Dry mucous membranes and decreased PO intake at home. Reassuringly creatinine at baseline, no leukocytosis. Lactic initially 2.1 that improved. She got 500ml LR bolus in the ED along with solumedrol, duonebs with improvement. Suspect poor po intake likely related to recent RSV infection/hospitalization. Afebrile, low suspicion for pneumonia.  - Admitted to FMTS, Dr. Donah attending - Hold 80mg  Lasix  daily for now, monitor volume status - Strict I/O - am TSH

## 2024-01-11 NOTE — ED Provider Notes (Signed)
 " [place] [facility] Provider Note   CSN: [id number] Arrival date & time: 01/11/24  1536     Patient presents with: No chief complaint on file.   Abigail Wallace is a 78 y.o. female.   Pt with recent uri symptoms, cough and congestion, and had outpatient labs done this week, and wbc was elevated (reportedly 16, whereas previous 8) - and was advised to come to hospital (not clear why would be advised to call EMS and have ED visit based on mildly elevated wbc).  It does appear patient has had persistence of her uri symptoms and general weakness. No fevers. No chest pain. No new or worsening sob. No nvd.   The history is provided by the patient and medical records. The history is limited by the condition of the patient.       Prior to Admission medications  Medication Sig Start Date End Date Taking? Authorizing Provider  acetaminophen  (TYLENOL ) 500 MG tablet Take 500-1,000 mg by mouth every 6 (six) hours as needed for mild pain (pain score 1-3) or headache.    [provider]  albuterol  (VENTOLIN  HFA) 108 (90 Base) MCG/ACT inhaler Inhale 2 puffs into the lungs every 6 (six) hours as needed for wheezing or shortness of breath. 11/11/23   Delores Suzann HERO, MD  allopurinol  (ZYLOPRIM ) 100 MG tablet Take 0.5 tablets (50 mg total) by mouth every other day. Patient taking differently: Take 50 mg by mouth in the morning. 09/19/23   Delores Suzann HERO, MD  amLODipine  (NORVASC ) 10 MG tablet Take 1 tablet (10 mg total) by mouth at bedtime. 10/11/23   Delores Suzann HERO, MD  apixaban  (ELIQUIS ) 2.5 MG TABS tablet Take 1 tablet (2.5 mg total) by mouth 2 (two) times daily. 12/11/22   Delores Suzann HERO, MD  cholecalciferol  (VITAMIN D3) 25 MCG (1000 UNIT) tablet Take 1 tablet (1,000 Units total) by mouth daily. 08/12/23   Rumball, Alison M, DO  diclofenac  Sodium (VOLTAREN ) 1 % GEL Apply 2 g topically 4 (four) times daily as needed (pain). 08/19/23   Delores Suzann HERO,  MD  divalproex  (DEPAKOTE ) 250 MG DR tablet Take 1 tablet (250 mg total) by mouth every 12 (twelve) hours. 03/05/22   Samtani, Jai-Gurmukh, MD  doxycycline  (VIBRA -TABS) 100 MG tablet Take 100 mg by mouth 2 (two) times daily. DC order was to take every 12 hours for one day. Patient obtained the day after discharge par patient's son as informant for medication review. Completed. Patient not taking: Reported on 01/06/2024    [provider]  Fluticasone -Umeclidin-Vilant (TRELEGY ELLIPTA ) 100-62.5-25 MCG/ACT AEPB Inhale 1 puff into the lungs daily. 11/11/23   Delores Suzann HERO, MD  furosemide  (LASIX ) 40 MG tablet Take 2 tablets (80 mg total) by mouth daily. 12/27/23 12/26/24  Baloch, Mahnoor, MD  glucose blood (ACCU-CHEK GUIDE TEST) test strip Use to check blood glucose once daily and as needed for symptoms 11/08/23   Delores Suzann HERO, MD  ipratropium-albuterol  (DUONEB) 0.5-2.5 (3) MG/3ML SOLN Take 3 mLs by nebulization every 4 (four) hours as needed. 11/11/23   Delores Suzann HERO, MD  Lancets (FREESTYLE) lancets Use to check blood glucose once daily and as needed for symptoms 11/08/23   Delores Suzann HERO, MD  linagliptin  (TRADJENTA ) 5 MG TABS tablet Take 1 tablet (5 mg total) by mouth daily. 12/27/23   Baloch, Mahnoor, MD  loperamide  (IMODIUM ) 2 MG capsule Take 1 capsule (2 mg total) by mouth as needed for  diarrhea or loose stools. 12/27/23   Baloch, Mahnoor, MD  metoprolol  tartrate (LOPRESSOR ) 100 MG tablet Take 1 tablet (100 mg total) by mouth 3 (three) times daily. 09/04/23   Delores Suzann HERO, MD  Multiple Vitamins-Minerals (MULTIVITAMIN WOMEN 50+) TABS Take 1 tablet by mouth daily with breakfast.    [provider]  nitroGLYCERIN  (NITROSTAT ) 0.4 MG SL tablet Place 1 tablet (0.4 mg total) under the tongue every 5 (five) minutes as needed for chest pain. 08/03/22 01/15/24  Croitoru, Mihai, MD  nystatin  cream (MYCOSTATIN ) Apply 1 Application topically daily as needed (Rash). Patient taking  differently: Apply 1 Application topically See admin instructions. Apply as directed to irritated areas of the right thigh and stomach in the morning and at bedtime 11/21/23   Delores Suzann HERO, MD  nystatin  powder Apply 1 Application topically daily. Apply after bathing. 10/04/23   Delores Suzann HERO, MD  OLANZapine  (ZYPREXA ) 10 MG tablet Take 15 mg by mouth at bedtime. 05/17/23   [provider]  OVER THE COUNTER MEDICATION Apply 1 application  topically See admin instructions. Caldesene Medicated Protecting Powder- Apply under the breasts and between the skin folds 2 times a day as needed for irritation    [provider]  oxyCODONE  (ROXICODONE ) 5 MG immediate release tablet Take 0.5 tablets (2.5 mg total) by mouth every 12 (twelve) hours as needed for severe pain (pain score 7-10). 12/19/23   Delores Suzann HERO, MD  penicillin  G IVPB Inject 12 Million Units into the vein daily. Indication:  Streptococcal mitis/oralis bacteremia First Dose: Yes Last Day of Therapy:  02/01/2024 Labs - Once weekly:  CBC/D and BMP, Labs - Once weekly: ESR and CRP Infuse 12 million units IV daily as a continuous infusion Method of administration: Elastomeric (Continuous infusion) Method of administration may be changed at the discretion of home infusion pharmacist based upon assessment of the patient and/or caregiver's ability to self-administer the medication ordered. 01/02/24 02/01/24  Dennise Kingsley, MD  polycarbophil (FIBERCON) 625 MG tablet Take 1 tablet (625 mg total) by mouth daily. Patient taking differently: Take 625 mg by mouth as needed for diarrhea or loose stools or mild constipation. 12/27/23   Baloch, Mahnoor, MD  pravastatin  (PRAVACHOL ) 40 MG tablet Take 1 tablet (40 mg total) by mouth every evening. 09/25/23   Croitoru, Mihai, MD  sodium bicarbonate  650 MG tablet Take 1 tablet (650 mg total) by mouth 3 (three) times daily. 12/05/23   Delores Suzann HERO, MD  triamcinolone  ointment (KENALOG ) 0.5 % Apply  1 Application topically 2 (two) times daily as needed (skin irritation). 10/04/23   Delores Suzann HERO, MD  Zinc  Oxide (DESITIN EX) Apply 1 Application topically as needed.    [provider]    Allergies: Abilify [aripiprazole], Keflex  [cephalexin ], Versacloz  [clozapine ], Ativan  [lorazepam ], Benadryl [diphenhydramine hcl], Benztropine , Latuda [lurasidone hcl], Remeron [mirtazapine], Haldol  [haloperidol  lactate], Codeine, and Latex    Review of Systems  Constitutional:  Negative for fever.  HENT:  Positive for congestion. Negative for sore throat.   Eyes:  Negative for redness.  Respiratory:  Positive for cough. Negative for shortness of breath.   Cardiovascular:  Negative for chest pain.  Gastrointestinal:  Negative for abdominal pain, blood in stool, diarrhea and vomiting.  Genitourinary:  Negative for dysuria and flank pain.  Musculoskeletal:  Negative for back pain and neck pain.  Neurological:  Negative for headaches.  Psychiatric/Behavioral:  Negative for confusion.     Updated Vital Signs BP (!) 119/50   Pulse 76  Temp 97.7 F (36.5 C) (Oral)   Resp 18   SpO2 96%   Physical Exam Vitals and nursing note reviewed.  Constitutional:      Appearance: Normal appearance. She is well-developed.  HENT:     Head: Atraumatic.     Nose: Nose normal.     Mouth/Throat:     Mouth: Mucous membranes are dry.     Comments: Dry mm Eyes:     General: No scleral icterus.    Conjunctiva/sclera: Conjunctivae normal.     Pupils: Pupils are equal, round, and reactive to light.  Neck:     Trachea: No tracheal deviation.     Comments: Trachea midline. No neck stiffness or rigidity.  Cardiovascular:     Rate and Rhythm: Normal rate and regular rhythm.     Pulses: Normal pulses.     Heart sounds: Normal heart sounds. No murmur heard.    No friction rub. No gallop.  Pulmonary:     Effort: Pulmonary effort is normal. No respiratory distress.     Breath sounds: Wheezing present.   Abdominal:     General: Bowel sounds are normal. There is no distension.     Palpations: Abdomen is soft.     Tenderness: There is no abdominal tenderness.  Genitourinary:    Comments: No cva tenderness.  Musculoskeletal:        General: No swelling or tenderness.     Cervical back: Normal range of motion and neck supple. No rigidity. No muscular tenderness.  Skin:    General: Skin is warm and dry.     Findings: No rash.  Neurological:     Mental Status: She is alert.     Comments: Alert, speech normal. Motor/sens grossly intact bil.   Psychiatric:        Mood and Affect: Mood normal.     (all labs ordered are listed, but only abnormal results are displayed) Results for orders placed or performed during the hospital encounter of 01/11/24  Comprehensive metabolic panel   Collection Time: 01/11/24  4:15 PM  Result Value Ref Range   Sodium 135 135 - 145 mmol/L   Potassium 3.8 3.5 - 5.1 mmol/L   Chloride 100 98 - 111 mmol/L   CO2 23 22 - 32 mmol/L   Glucose, Bld 259 (H) 70 - 99 mg/dL   BUN 45 (H) 8 - 23 mg/dL   Creatinine, Ser 7.09 (H) 0.44 - 1.00 mg/dL   Calcium  9.4 8.9 - 10.3 mg/dL   Total Protein 5.5 (L) 6.5 - 8.1 g/dL   Albumin 2.6 (L) 3.5 - 5.0 g/dL   AST 15 15 - 41 U/L   ALT 7 0 - 44 U/L   Alkaline Phosphatase 63 38 - 126 U/L   Total Bilirubin <0.2 0.0 - 1.2 mg/dL   GFR, Estimated 16 (L) >60 mL/min   Anion gap 13 5 - 15  CBC with Differential   Collection Time: 01/11/24  4:15 PM  Result Value Ref Range   WBC 10.2 4.0 - 10.5 K/uL   RBC 3.13 (L) 3.87 - 5.11 MIL/uL   Hemoglobin 9.4 (L) 12.0 - 15.0 g/dL   HCT 69.4 (L) 63.9 - 53.9 %   MCV 97.4 80.0 - 100.0 fL   MCH 30.0 26.0 - 34.0 pg   MCHC 30.8 30.0 - 36.0 g/dL   RDW 84.9 88.4 - 84.4 %   Platelets 148 (L) 150 - 400 K/uL   nRBC 0.0 0.0 - 0.2 %  Neutrophils Relative % 79 %   Neutro Abs 8.1 (H) 1.7 - 7.7 K/uL   Lymphocytes Relative 15 %   Lymphs Abs 1.5 0.7 - 4.0 K/uL   Monocytes Relative 2 %   Monocytes  Absolute 0.2 0.1 - 1.0 K/uL   Eosinophils Relative 1 %   Eosinophils Absolute 0.1 0.0 - 0.5 K/uL   Basophils Relative 1 %   Basophils Absolute 0.1 0.0 - 0.1 K/uL   WBC Morphology See Note    Smear Review See Note    Metamyelocytes Relative 2 %   Abs Immature Granulocytes 0.20 (H) 0.00 - 0.07 K/uL   Schistocytes PRESENT    Tear Drop Cells PRESENT    Basophilic Stippling PRESENT   I-Stat Lactic Acid, ED   Collection Time: 01/11/24  4:28 PM  Result Value Ref Range   Lactic Acid, Venous 2.1 (HH) 0.5 - 1.9 mmol/L   Comment NOTIFIED PHYSICIAN   Resp panel by RT-PCR (RSV, Flu A&B, Covid) Anterior Nasal Swab   Collection Time: 01/11/24  5:28 PM   Specimen: Anterior Nasal Swab  Result Value Ref Range   SARS Coronavirus 2 by RT PCR NEGATIVE NEGATIVE   Influenza A by PCR NEGATIVE NEGATIVE   Influenza B by PCR NEGATIVE NEGATIVE   Resp Syncytial Virus by PCR POSITIVE (A) NEGATIVE   *Note: Due to a large number of results and/or encounters for the requested time period, some results have not been displayed. A complete set of results can be found in Results Review.   DG Chest 2 View Result Date: 01/11/2024 EXAM: 2 VIEW(S) XRAY OF THE CHEST 01/11/2024 05:25:00 PM COMPARISON: Chest x-ray 12/30/2023, CT chest 10/28/2023. CLINICAL HISTORY: Infection. FINDINGS: LINES, TUBES AND DEVICES: Stable right internal jugular tunneled central venous catheter with distal tip at superior cavoatrial junction. Stable left subclavian dual lead pacemaker. LUNGS AND PLEURA: At least traces to small volume left pleural effusion. No focal pulmonary opacity. No pneumothorax. HEART AND MEDIASTINUM: Stable cardiomegaly. Aortic atherosclerosis. BONES AND SOFT TISSUES: Limited evaluation due to overlapping osseous structures and overlying soft tissues. No acute osseous abnormality. IMPRESSION: 1. Trace to small left pleural effusion. 2. Limited evaluation due to overlapping osseous structures and overlying soft tissues.  Electronically signed by: Morgane Naveau MD 01/11/2024 06:35 PM EST RP Workstation: [id number]   DG Chest 1 View Result Date: 12/30/2023 CLINICAL DATA:  Shortness of breath. EXAM: CHEST  1 VIEW COMPARISON:  Chest radiograph dated 12/20/2023. FINDINGS: Evaluation is limited due to body habitus and patient rotation. Right sided central venous line with tip in the region of the cavoatrial junction. Cardiomegaly with vascular congestion. No large pleural effusion or pneumothorax. Atherosclerotic calcification of the aorta. Left pectoral pacemaker device. No acute osseous pathology. IMPRESSION: Cardiomegaly with vascular congestion. Electronically Signed   By: Vanetta Chou M.D.   On: 12/30/2023 15:26   IR TUNNELED CENTRAL VENOUS CATH PLC W IMG Result Date: 12/26/2023 INDICATION: requiring PICC line for IV ABx, aware of risks EXAM: ULTRASOUND AND FLUOROSCOPIC GUIDED PLACEMENT OF TUNNELED CENTRAL VENOUS Powerline CATHETER MEDICATIONS: None. ANESTHESIA/SEDATION: Local anesthetic was administered. FLUOROSCOPY: Radiation Exposure Index and estimated peak skin dose (PSD); Reference air kerma (RAK), 21.3 mGy. COMPLICATIONS: None immediate. PROCEDURE: Informed written consent was obtained from the patient and/or patient's representative after a discussion of the risks, benefits, and alternatives to treatment. Questions regarding the procedure were encouraged and answered. The RIGHT neck and chest were prepped with chlorhexidine  in a sterile fashion, and a sterile drape was applied covering the  operative field. Maximum barrier sterile technique with sterile gowns and gloves were used for the procedure. A timeout was performed prior to the initiation of the procedure. After the overlying soft tissues were anesthetized, a small venotomy incision was created and a micropuncture kit was utilized to access the internal jugular vein. Real-time ultrasound guidance was utilized for vascular access including the acquisition  of a permanent ultrasound image documenting patency of the accessed vessel. The microwire was utilized to measure appropriate catheter length. The micropuncture sheath was exchanged for a peel-away sheath over a guidewire. A 5 Fr dual lumen tunneled central venous catheter measuring 26 cm was tunneled in a retrograde fashion from the anterior chest wall to the venotomy incision. The catheter was then placed through the peel-away sheath with tip ultimately positioned at the superior caval-atrial junction. Final catheter positioning was confirmed and documented with a spot radiographic image. The catheter aspirates and flushes normally. The catheter was flushed with appropriate volume heparin dwells. The catheter exit site was secured with a 3-0 Ethilon retention suture. The venotomy incision was closed with Dermabond. Dressings were applied. The patient tolerated the procedure well without immediate post procedural complication. FINDINGS: After catheter placement, the tip lies within the superior aspect of the right atrium. The catheter aspirates and flushes normally and is ready for immediate use. IMPRESSION: Successful placement of 26 cm dual lumen tunneled central venous Powerline catheter via the RIGHT internal jugular vein The tip of the catheter is positioned within the proximal RIGHT atrium. The catheter is ready for immediate use. Thom Hall, MD [facility] Electronically Signed   By: Thom Hall M.D.   On: 12/26/2023 16:57   US  EKG SITE RITE Result Date: 12/25/2023 If Site Rite image not attached, placement could not be confirmed due to current cardiac rhythm.  ECHO TEE Result Date: 12/23/2023    TRANSESOPHOGEAL ECHO REPORT   Patient Name:   Errica [name] Date of Exam: 12/23/2023 Medical Rec #:  [id number]               Height:       66.0 in Accession #:    [id number]              Weight:       331.1 lb Date of Birth:  1945-05-04                BSA:          2.478 m Patient Age:    78 years                BP:           166/70 mmHg Patient Gender: F                       HR:           73 bpm. Exam Location:  Inpatient Procedure: Transesophageal Echo, Cardiac Doppler and Color Doppler (Both            Spectral and Color Flow Doppler were utilized during procedure). Indications:     Endocarditis  History:         Patient has prior history of Echocardiogram examinations, most                  recent 12/20/2023. CHF, Pacemaker, CKD, Arrythmias:Atrial                  Fibrillation;  Risk Factors:Sleep Apnea, Hypertension and Former                  Smoker.  Sonographer:     Merlynn Argyle Referring Phys:  [id number] MAUDE JAYSON EMMER Diagnosing Phys: Wilbert Bihari MD PROCEDURE: After discussion of the risks and benefits of a TEE, an informed consent was obtained from a family member. The transesophogeal probe was passed without difficulty through the esophogus of the patient. Imaged were obtained with the patient in a left lateral decubitus position. Sedation performed by different physician. The patient was monitored while under deep sedation. Anesthestetic sedation was provided intravenously by Anesthesiology: 154mg  of Propofol . Image quality was good. The patient  developed no complications during the procedure.  IMPRESSIONS  1. Left ventricular ejection fraction, by estimation, is 50 to 55%. The left ventricle has low normal function. The left ventricle has no regional wall motion abnormalities.  2. Right ventricular systolic function is normal. The right ventricular size is normal.  3. Left atrial size was moderately dilated. No left atrial/left atrial appendage thrombus was detected.  4. Right atrial size was moderately dilated. There is a device lead in the RA that appears thickened but no obvious shaggy vegetation.  5. Redundant chordae tendinae. The mitral valve is normal in structure. Trivial mitral valve regurgitation.  6. The aortic valve is normal in  structure. Aortic valve regurgitation is not visualized.  7. There is mild (Grade II) layered plaque involving the ascending aorta and aortic arch. Conclusion(s)/Recommendation(s): No evidence of vegetation/infective endocarditis on this transesophageael echocardiogram. There is a device lead in the RA that is thickened but no evidence of shaggy vegetation FINDINGS  Left Ventricle: Left ventricular ejection fraction, by estimation, is 50 to 55%. The left ventricle has low normal function. The left ventricle has no regional wall motion abnormalities. The left ventricular internal cavity size was normal in size. There is no left ventricular hypertrophy. Right Ventricle: The right ventricular size is normal. No increase in right ventricular wall thickness. Right ventricular systolic function is normal. Left Atrium: Left atrial size was moderately dilated. Spontaneous echo contrast was present in the left atrium. No left atrial/left atrial appendage thrombus was detected. Right Atrium: Right atrial size was moderately dilated. There is a device lead in the RA that appears thickened but no obvious shaggy vegetation. Pericardium: There is no evidence of pericardial effusion. Mitral Valve: Redundant chordae tendinae. The mitral valve is normal in structure. Trivial mitral valve regurgitation. Tricuspid Valve: The tricuspid valve is normal in structure. Tricuspid valve regurgitation is trivial. Aortic Valve: The aortic valve is normal in structure. Aortic valve regurgitation is not visualized. Pulmonic Valve: The pulmonic valve was normal in structure. Pulmonic valve regurgitation is not visualized. No evidence of pulmonic stenosis. Aorta: The aortic root and ascending aorta are structurally normal, with no evidence of dilitation. There is mild (Grade II) layered plaque involving the ascending aorta and aortic arch. IAS/Shunts: No atrial level shunt detected by color flow Doppler. Additional Comments: A device lead is  visualized. Spectral Doppler performed. Wilbert Bihari MD Electronically signed by Wilbert Bihari MD Signature Date/Time: 12/23/2023/7:51:41 PM    Final    EP STUDY Result Date: 12/23/2023 See surgical note for result.  CT ABDOMEN PELVIS WO CONTRAST Result Date: 12/22/2023 EXAM: CT ABDOMEN AND PELVIS WITHOUT CONTRAST 12/22/2023 06:26:36 PM TECHNIQUE: CT of the abdomen and pelvis was performed without the administration of intravenous contrast. Multiplanar reformatted images are provided for review. Automated exposure control, iterative reconstruction, and/or  weight-based adjustment of the mA/kV was utilized to reduce the radiation dose to as low as reasonably achievable. COMPARISON: 10/28/2023 CLINICAL HISTORY: Sepsis FINDINGS: LOWER CHEST: Bibasilar atelectasis. Mild cardiomegaly. Pacemaker leads significant within the right ventricle towards the apex. Moderate right coronary artery calcification. LIVER: The liver is unremarkable. GALLBLADDER AND BILE DUCTS: Status post cholecystectomy. No biliary ductal dilatation. SPLEEN: No acute abnormality. PANCREAS: No acute abnormality. ADRENAL GLANDS: No acute abnormality. KIDNEYS, URETERS AND BLADDER: The kidneys are atrophic bilaterally. Multiple extensive cortical lesions were identified, the majority of which were compatible with simple cortical cysts. However, several of which are relatively hyperdense and are indeterminate on the single-phase examination. If indicated, this will be better assessed with a dedicated renal mass protocol CT or MRI examination when the patient's acute issues have resolved. No stones in the kidneys or ureters. No hydronephrosis. No perinephric or periureteral stranding. The bladder is decompressed with a foley catheter balloon seen within its lumen. GI AND BOWEL: A large umbilical hernia extends the majority of the transverse colon. The transverse colon is unremarkable. Mild diverticulosis noted within a redundant sigmoid colon. The  stomach, small bowel, and large bowel are otherwise unremarkable. Appendix absent. No evidence of obstruction. PERITONEUM AND RETROPERITONEUM: No ascites. No free air. VASCULATURE: Extensive aortoiliac atherosclerotic calcification. Superimposed fusiform 3.9 cm infrarenal abdominal aortic aneurysm, stable since prior examination. LYMPH NODES: No lymphadenopathy. REPRODUCTIVE ORGANS: No acute abnormality. BONES AND SOFT TISSUES: Advanced degenerative changes are noted asymmetrically within the right hip. Osseous structures are otherwise age appropriate. No acute bone abnormality. No focal soft tissue abnormality. IMPRESSION: 1. Stable 3.9 cm infrarenal abdominal aortic aneurysm with extensive aortoiliac atherosclerotic calcification, recommend ultrasound follow-up in 3 years per society for vascular surgery guidelines. 2. Atrophic kidneys with multiple exophytic cortical lesions, several of which are indeterminate on this single-phase exam, recommend dedicated renal mass protocol CT or MRI when feasible, if clinically indicated . 3. Large umbilical hernia containing the majority of the transverse colon. No obstruction. 4. Mild diverticulosis within a redundant sigmoid colon without superimposed inflammatory change 5. Mild cardiomegaly. 6. Moderate right coronary artery calcification. 7. Advanced degenerative changes of the right hip. Electronically signed by: Dorethia Molt MD 12/22/2023 08:30 PM EST RP Workstation: [id number]   ECHOCARDIOGRAM COMPLETE Result Date: 12/20/2023    ECHOCARDIOGRAM REPORT   Patient Name:   Avannah Decker [facility] Date of Exam: 12/20/2023 Medical Rec #:  [id number]               Height:       66.0 in Accession #:    [id number]              Weight:       325.4 lb Date of Birth:  1946/01/13               BSA:          2.459 m Patient Age:    78 years                BP:           134/102 mmHg Patient Gender: F                       HR:           70 bpm. Exam Location:  Inpatient Procedure: 2D  Echo (Both Spectral and Color Flow Doppler were utilized during            procedure). Indications:  CHF  History:        Patient has prior history of Echocardiogram examinations. CHF.  Sonographer:    Charmaine Gaskins Referring Phys: FAIRY AMY  Sonographer Comments: Technically challenging study due to limited acoustic windows, Technically difficult study due to poor echo windows and no subcostal window. Image acquisition challenging due to patient body habitus. IMPRESSIONS  1. No subcostal views; distal septum and apex wall motion c/w pacing; overall low normal LV function.  2. Left ventricular ejection fraction, by estimation, is 50 to 55%. The left ventricle has low normal function. The left ventricle has no regional wall motion abnormalities. There is severe left ventricular hypertrophy of the basal-septal segment. Left ventricular diastolic parameters are indeterminate.  3. Right ventricular systolic function is normal. The right ventricular size is normal.  4. Left atrial size was moderately dilated.  5. Right atrial size was moderately dilated.  6. The mitral valve is normal in structure. No evidence of mitral valve regurgitation. No evidence of mitral stenosis.  7. The aortic valve has an indeterminant number of cusps. Aortic valve regurgitation is not visualized. No aortic stenosis is present. FINDINGS  Left Ventricle: Left ventricular ejection fraction, by estimation, is 50 to 55%. The left ventricle has low normal function. The left ventricle has no regional wall motion abnormalities. The left ventricular internal cavity size was normal in size. There is severe left ventricular hypertrophy of the basal-septal segment. Abnormal (paradoxical) septal motion, consistent with RV pacemaker. Left ventricular diastolic parameters are indeterminate. Right Ventricle: The right ventricular size is normal. Right ventricular systolic function is normal. Left Atrium: Left atrial size was moderately dilated. Right  Atrium: Right atrial size was moderately dilated. Pericardium: There is no evidence of pericardial effusion. Mitral Valve: The mitral valve is normal in structure. Mild mitral annular calcification. No evidence of mitral valve regurgitation. No evidence of mitral valve stenosis. MV peak gradient, 7.8 mmHg. The mean mitral valve gradient is 3.0 mmHg. Tricuspid Valve: The tricuspid valve is normal in structure. Tricuspid valve regurgitation is trivial. No evidence of tricuspid stenosis. Aortic Valve: The aortic valve has an indeterminant number of cusps. Aortic valve regurgitation is not visualized. No aortic stenosis is present. Aortic valve mean gradient measures 4.0 mmHg. Aortic valve peak gradient measures 8.1 mmHg. Pulmonic Valve: The pulmonic valve was normal in structure. Pulmonic valve regurgitation is not visualized. No evidence of pulmonic stenosis. Aorta: The aortic root is normal in size and structure. Venous: The inferior vena cava was not well visualized. IAS/Shunts: The interatrial septum was not well visualized. Additional Comments: No subcostal views; distal septum and apex wall motion c/w pacing; overall low normal LV function. A device lead is visualized.  LEFT VENTRICLE PLAX 2D LVIDd:         3.80 cm     Diastology LVIDs:         3.00 cm     LV e' medial:    5.00 cm/s LV PW:         1.20 cm     LV E/e' medial:  20.2 LV IVS:        1.80 cm     LV e' lateral:   7.72 cm/s                            LV E/e' lateral: 13.1  LV Volumes (MOD) LV vol d, MOD A2C: 92.5 ml LV vol d, MOD A4C: 89.6 ml LV vol s,  MOD A2C: 60.9 ml LV vol s, MOD A4C: 61.9 ml LV SV MOD A2C:     31.6 ml LV SV MOD A4C:     89.6 ml LV SV MOD BP:      29.2 ml RIGHT VENTRICLE RV Basal diam:  4.00 cm RV Mid diam:    3.20 cm RV S prime:     9.46 cm/s TAPSE (M-mode): 1.8 cm LEFT ATRIUM              Index        RIGHT ATRIUM           Index LA Vol (A2C):   98.4 ml  40.01 ml/m  RA Area:     28.60 cm LA Vol (A4C):   94.2 ml  38.30 ml/m  RA  Volume:   102.00 ml 41.47 ml/m LA Biplane Vol: 102.0 ml 41.47 ml/m  AORTIC VALVE                   PULMONIC VALVE AV Vmax:           142.00 cm/s PV Vmax:       0.86 m/s AV Vmean:          96.000 cm/s PV Peak grad:  2.9 mmHg AV VTI:            0.276 m AV Peak Grad:      8.1 mmHg AV Mean Grad:      4.0 mmHg LVOT Vmax:         90.20 cm/s LVOT Vmean:        52.100 cm/s LVOT VTI:          0.153 m LVOT/AV VTI ratio: 0.55  AORTA Ao Asc diam: 3.40 cm MITRAL VALVE MV Area (PHT): 4.89 cm     SHUNTS MV Peak grad:  7.8 mmHg     Systemic VTI: 0.15 m MV Mean grad:  3.0 mmHg MV Vmax:       1.40 m/s MV Vmean:      81.7 cm/s MV Decel Time: 155 msec MV E velocity: 101.00 cm/s MV A velocity: 56.10 cm/s MV E/A ratio:  1.80 Redell Shallow MD Electronically signed by Redell Shallow MD Signature Date/Time: 12/20/2023/3:18:05 PM    Final    CT HEAD WO CONTRAST ( ) Result Date: 12/20/2023 EXAM: CT HEAD WITHOUT CONTRAST 12/20/2023 06:03:05 AM TECHNIQUE: CT of the head was performed without the administration of intravenous contrast. Automated exposure control, iterative reconstruction, and/or weight based adjustment of the mA/kV was utilized to reduce the radiation dose to as low as reasonably achievable. COMPARISON: 02/28/2022 CLINICAL HISTORY: Mental status change, unknown cause FINDINGS: BRAIN AND VENTRICLES: No acute hemorrhage. No evidence of acute infarct. Prominence of the sulci and ventricles compatible with brain atrophy. Unchanged appearance of small left frontal meningioma measuring 8 mm, image 33/6. No hydrocephalus. No extra-axial collection. No mass effect or midline shift. ORBITS: No acute abnormality. SINUSES: No acute abnormality. SOFT TISSUES AND SKULL: No acute soft tissue abnormality. No skull fracture. IMPRESSION: 1. No acute intracranial abnormality. 2. Unchanged small left frontal meningioma measuring 8 mm. 3. Prominent sulci and ventricles compatible with brain atrophy. Electronically signed by: Waddell Calk  MD 12/20/2023 06:10 AM EST RP Workstation: [id number]   DG Chest Portable 1 View Result Date: 12/20/2023 CLINICAL DATA:  Nausea, vomiting and diarrhea. EXAM: PORTABLE CHEST 1 VIEW COMPARISON:  November 24, 2023 FINDINGS: There is stable dual lead AICD lead positioning. The cardiac silhouette is enlarged and  unchanged in size. There is marked severity calcification of the thoracic aorta. Mild prominence of the pulmonary vasculature is seen. Diffuse, chronic appearing increased interstitial lung markings are noted. No focal consolidation, pleural effusion or pneumothorax is identified. The visualized skeletal structures are unremarkable. IMPRESSION: Cardiomegaly with mild pulmonary vascular congestion. Electronically Signed   By: Suzen Dials M.D.   On: 12/20/2023 01:07     EKG: None  Radiology: DG Chest 2 View Result Date: 01/11/2024 EXAM: 2 VIEW(S) XRAY OF THE CHEST 01/11/2024 05:25:00 PM COMPARISON: Chest x-ray 12/30/2023, CT chest 10/28/2023. CLINICAL HISTORY: Infection. FINDINGS: LINES, TUBES AND DEVICES: Stable right internal jugular tunneled central venous catheter with distal tip at superior cavoatrial junction. Stable left subclavian dual lead pacemaker. LUNGS AND PLEURA: At least traces to small volume left pleural effusion. No focal pulmonary opacity. No pneumothorax. HEART AND MEDIASTINUM: Stable cardiomegaly. Aortic atherosclerosis. BONES AND SOFT TISSUES: Limited evaluation due to overlapping osseous structures and overlying soft tissues. No acute osseous abnormality. IMPRESSION: 1. Trace to small left pleural effusion. 2. Limited evaluation due to overlapping osseous structures and overlying soft tissues. Electronically signed by: Morgane Naveau MD 01/11/2024 06:35 PM EST RP Workstation: [id number]     Procedures   Medications Ordered in the ED  albuterol  (PROVENTIL ) (2.5 MG/3ML) 0.083% nebulizer solution 5 mg (has no administration in time range)  ipratropium (ATROVENT )  nebulizer solution 0.5 mg (has no administration in time range)  methylPREDNISolone  sodium succinate (SOLU-MEDROL ) 125 mg/2 mL injection 125 mg (has no administration in time range)  lactated ringers  bolus 500 mL (has no administration in time range)  albuterol  (PROVENTIL ) (2.5 MG/3ML) 0.083% nebulizer solution 5 mg (has no administration in time range)                                    Medical Decision Making Problems Addressed: COPD exacerbation (HCC): acute illness or injury with systemic symptoms that poses a threat to life or bodily functions Dehydration: acute illness or injury Dyspnea on exertion: acute illness or injury with systemic symptoms that poses a threat to life or bodily functions    Details: Acute on chronic Failure to thrive  in adult: chronic illness or injury Generalized weakness: acute illness or injury    Details: Acute/chronic Respiratory syncytial virus (RSV) as cause of acute bronchitis: acute illness or injury with systemic symptoms Stage 4 chronic kidney disease (HCC): chronic illness or injury with exacerbation, progression, or side effects of treatment that poses a threat to life or bodily functions Wheezing: acute illness or injury with systemic symptoms that poses a threat to life or bodily functions  Amount and/or Complexity of Data Reviewed Independent Historian: EMS    Details: hx External Data Reviewed: notes. Labs: ordered. Decision-making details documented in ED Course. Radiology: ordered and independent interpretation performed. Decision-making details documented in ED Course. ECG/medicine tests: ordered and independent interpretation performed. Decision-making details documented in ED Course. Discussion of management or test interpretation with external provider(s): Family medicine  Risk Prescription drug management. Decision regarding hospitalization.   Iv ns. Continuous pulse ox and cardiac monitoring. Labs ordered/sent. Imaging ordered.    Differential diagnosis includes pna, covid, flu, etc. Dispo decision including potential need for admission considered - will get labs and imaging and reassess.   Reviewed nursing notes and prior charts for additional history. External reports reviewed. Additional history from:  Cardiac monitor: sinus rhythm, rate 76.  Labs reviewed/interpreted by me -  wbc 10, hct 30. Chem w elev glucose, hco3 normal, ckd, rsv positive.  Lactate mildly high, dry mm ?volume depletion, ivf bolus.   Xrays reviewed/interpreted by me - no pna.   Solumedrol. Albuterol  and atrovent  neb.   Persistent wheezing. Additional albuterol  treatment.   Wheezing persists. Family practice consulted for admission.       Final diagnoses:  Generalized weakness  Dyspnea on exertion  Wheezing  COPD exacerbation (HCC)  Respiratory syncytial virus (RSV) as cause of acute bronchitis  Failure to thrive  in adult  Stage 4 chronic kidney disease [facility])    ED Discharge Orders     None          Bernard Drivers, MD 01/11/24 1938  "

## 2024-01-12 DIAGNOSIS — E872 Acidosis, unspecified: Secondary | ICD-10-CM | POA: Diagnosis present

## 2024-01-12 DIAGNOSIS — I5023 Acute on chronic systolic (congestive) heart failure: Secondary | ICD-10-CM | POA: Diagnosis present

## 2024-01-12 DIAGNOSIS — J440 Chronic obstructive pulmonary disease with acute lower respiratory infection: Secondary | ICD-10-CM | POA: Diagnosis present

## 2024-01-12 DIAGNOSIS — Z6841 Body Mass Index (BMI) 40.0 and over, adult: Secondary | ICD-10-CM | POA: Diagnosis not present

## 2024-01-12 DIAGNOSIS — J441 Chronic obstructive pulmonary disease with (acute) exacerbation: Secondary | ICD-10-CM | POA: Diagnosis present

## 2024-01-12 DIAGNOSIS — I495 Sick sinus syndrome: Secondary | ICD-10-CM | POA: Diagnosis present

## 2024-01-12 DIAGNOSIS — Y831 Surgical operation with implant of artificial internal device as the cause of abnormal reaction of the patient, or of later complication, without mention of misadventure at the time of the procedure: Secondary | ICD-10-CM | POA: Diagnosis present

## 2024-01-12 DIAGNOSIS — M109 Gout, unspecified: Secondary | ICD-10-CM | POA: Diagnosis present

## 2024-01-12 DIAGNOSIS — R4789 Other speech disturbances: Secondary | ICD-10-CM

## 2024-01-12 DIAGNOSIS — E785 Hyperlipidemia, unspecified: Secondary | ICD-10-CM | POA: Diagnosis present

## 2024-01-12 DIAGNOSIS — R531 Weakness: Secondary | ICD-10-CM | POA: Diagnosis not present

## 2024-01-12 DIAGNOSIS — R627 Adult failure to thrive: Secondary | ICD-10-CM | POA: Diagnosis present

## 2024-01-12 DIAGNOSIS — I509 Heart failure, unspecified: Secondary | ICD-10-CM | POA: Diagnosis present

## 2024-01-12 DIAGNOSIS — Z7901 Long term (current) use of anticoagulants: Secondary | ICD-10-CM | POA: Diagnosis not present

## 2024-01-12 DIAGNOSIS — E669 Obesity, unspecified: Secondary | ICD-10-CM | POA: Diagnosis present

## 2024-01-12 DIAGNOSIS — E860 Dehydration: Secondary | ICD-10-CM | POA: Diagnosis present

## 2024-01-12 DIAGNOSIS — E1122 Type 2 diabetes mellitus with diabetic chronic kidney disease: Secondary | ICD-10-CM | POA: Diagnosis present

## 2024-01-12 DIAGNOSIS — T827XXA Infection and inflammatory reaction due to other cardiac and vascular devices, implants and grafts, initial encounter: Secondary | ICD-10-CM | POA: Diagnosis present

## 2024-01-12 DIAGNOSIS — N390 Urinary tract infection, site not specified: Secondary | ICD-10-CM | POA: Diagnosis present

## 2024-01-12 DIAGNOSIS — N179 Acute kidney failure, unspecified: Secondary | ICD-10-CM | POA: Diagnosis present

## 2024-01-12 DIAGNOSIS — I2510 Atherosclerotic heart disease of native coronary artery without angina pectoris: Secondary | ICD-10-CM | POA: Diagnosis present

## 2024-01-12 DIAGNOSIS — D631 Anemia in chronic kidney disease: Secondary | ICD-10-CM | POA: Diagnosis present

## 2024-01-12 DIAGNOSIS — R7881 Bacteremia: Secondary | ICD-10-CM | POA: Diagnosis present

## 2024-01-12 DIAGNOSIS — B954 Other streptococcus as the cause of diseases classified elsewhere: Secondary | ICD-10-CM | POA: Diagnosis present

## 2024-01-12 DIAGNOSIS — I4891 Unspecified atrial fibrillation: Secondary | ICD-10-CM | POA: Diagnosis present

## 2024-01-12 DIAGNOSIS — I130 Hypertensive heart and chronic kidney disease with heart failure and stage 1 through stage 4 chronic kidney disease, or unspecified chronic kidney disease: Secondary | ICD-10-CM | POA: Diagnosis present

## 2024-01-12 DIAGNOSIS — N184 Chronic kidney disease, stage 4 (severe): Secondary | ICD-10-CM | POA: Diagnosis present

## 2024-01-12 DIAGNOSIS — F200 Paranoid schizophrenia: Secondary | ICD-10-CM | POA: Diagnosis present

## 2024-01-12 LAB — BASIC METABOLIC PANEL WITH GFR
Anion gap: 13 (ref 5–15)
BUN: 46 mg/dL — ABNORMAL HIGH (ref 8–23)
CO2: 23 mmol/L (ref 22–32)
Calcium: 9.5 mg/dL (ref 8.9–10.3)
Chloride: 99 mmol/L (ref 98–111)
Creatinine, Ser: 3 mg/dL — ABNORMAL HIGH (ref 0.44–1.00)
GFR, Estimated: 15 mL/min — ABNORMAL LOW
Glucose, Bld: 259 mg/dL — ABNORMAL HIGH (ref 70–99)
Potassium: 4.5 mmol/L (ref 3.5–5.1)
Sodium: 135 mmol/L (ref 135–145)

## 2024-01-12 LAB — CBG MONITORING, ED
Glucose-Capillary: 193 mg/dL — ABNORMAL HIGH (ref 70–99)
Glucose-Capillary: 180 mg/dL — ABNORMAL HIGH (ref 70–99)

## 2024-01-12 LAB — PRO BRAIN NATRIURETIC PEPTIDE: Pro Brain Natriuretic Peptide: 7722 pg/mL — ABNORMAL HIGH

## 2024-01-12 LAB — GLUCOSE, CAPILLARY
Glucose-Capillary: 150 mg/dL — ABNORMAL HIGH (ref 70–99)
Glucose-Capillary: 152 mg/dL — ABNORMAL HIGH (ref 70–99)

## 2024-01-12 LAB — MAGNESIUM: Magnesium: 2 mg/dL (ref 1.7–2.4)

## 2024-01-12 LAB — TSH: TSH: 2.82 u[IU]/mL (ref 0.350–4.500)

## 2024-01-12 MED ORDER — ALBUTEROL SULFATE (2.5 MG/3ML) 0.083% IN NEBU
2.5000 mg | INHALATION_SOLUTION | RESPIRATORY_TRACT | Status: DC | PRN
  Administered 2024-01-16: 2.5 mg via RESPIRATORY_TRACT

## 2024-01-12 MED ORDER — IPRATROPIUM-ALBUTEROL 0.5-2.5 (3) MG/3ML IN SOLN
3.0000 mL | Freq: Three times a day (TID) | RESPIRATORY_TRACT | Status: DC
  Administered 2024-01-13: 3 mL via RESPIRATORY_TRACT

## 2024-01-12 MED ORDER — LEVOFLOXACIN IN D5W 750 MG/150ML IV SOLN
750.0000 mg | Freq: Once | INTRAVENOUS | Status: AC
  Administered 2024-01-12: 750 mg via INTRAVENOUS
  Filled 2024-01-12: qty 150

## 2024-01-12 MED ORDER — FUROSEMIDE 10 MG/ML IJ SOLN
60.0000 mg | Freq: Once | INTRAMUSCULAR | Status: AC
  Administered 2024-01-12: 60 mg via INTRAVENOUS
  Filled 2024-01-12: qty 6

## 2024-01-12 MED ORDER — SODIUM CHLORIDE 0.9% FLUSH: 10.0000 mL | INTRAVENOUS | Status: DC | PRN

## 2024-01-12 MED ORDER — CHLORHEXIDINE GLUCONATE CLOTH 2 % EX PADS
6.0000 | MEDICATED_PAD | Freq: Every day | CUTANEOUS | Status: DC
  Administered 2024-01-13 – 2024-01-16 (×3): 6 via TOPICAL

## 2024-01-12 MED ORDER — SODIUM CHLORIDE 0.9% FLUSH
10.0000 mL | Freq: Two times a day (BID) | INTRAVENOUS | Status: DC
  Administered 2024-01-13 – 2024-01-16 (×8): 10 mL

## 2024-01-12 NOTE — Assessment & Plan Note (Addendum)
 Patient with poor intake and residual fatigue likely secondary to recent RSV illness.  Given her poor intake, it is possible she has missed doses of Lasix  at home.  proBNP markedly elevated at greater than 7000 indicating volume overload and likely contributor to dyspnea.  Her chest x-ray does show a mild left pleural effusion, though it is difficult to fully appreciate given patient's rotation and overlying soft tissues. -Continue 60 mg IV Lasix  today, redose pending output (home dose 80 mg p.o. daily) -Strict I's and O's, daily weights -Consider consult to cardiology today for further recommendations -Continue home Trelegy, DuoNeb for known COPD -Wean O2 as tolerated

## 2024-01-12 NOTE — Progress Notes (Addendum)
 "  Daily Progress Note Intern Pager: [phone]  Patient name: Abigail Wallace [facility] Medical record number: [id number] Date of birth: Oct 13, 1945 Age: 78 y.o. Gender: female  Primary Care Provider: Delores Suzann HERO, MD Consultants: None Code Status: Full  Pt Overview and Major Events to Date:  12/27-admitted  Medical Decision Making:  Aleta Manternach [facility] is a 78 year old female presenting with dyspnea and generalized fatigue in the setting of CHF exacerbation and recent RSV infection. Pertinent PMH/PSH includes A-fib on Eliquis , CKD 3B, COPD, CAD, GERD, gout, hypertension, pacemaker due to sick sinus syndrome, schizophrenia, T2DM.  Assessment & Plan Acute on chronic systolic heart failure (HCC) Fatigue Dyspnea Patient with poor intake and residual fatigue likely secondary to recent RSV illness.  Given her poor intake, it is possible she has missed doses of Lasix  at home.  proBNP markedly elevated at greater than 7000 indicating volume overload and likely contributor to dyspnea.  Her chest x-ray does show a mild left pleural effusion, though it is difficult to fully appreciate given patient's rotation and overlying soft tissues. -Continue 60 mg IV Lasix  today, redose pending output (home dose 80 mg p.o. daily) -Strict I's and O's, daily weights -Consider consult to cardiology today for further recommendations -Continue home Trelegy, DuoNeb for known COPD -Wean O2 as tolerated Dysuria Son reports cloudy urine, dysuria, and flank pain x3 days. UA with leukocytes though nonspecific in the setting of her chronic indwelling Foley after polio as a child.  Recently treated for Klebsiella pneumonia UTI initially on Bactrim  though then switched to penicillin  alone for bacteremia as below; however, this Klebsiella was noted to be resistant to ampicillin and unclear if the pathogen has persisted given ampicillin and penicillin  likely have increased cross-reactivity. -Await urine culture -Consider  consulting ID for potential change in antibiotics to cover both bacteremia and UTI if confirmed Episode of change in speech Chronic, relapsing issue.  Recent CT head on 12/20/2023 without acute abnormality.  Felt to be due to her shortness of breath versus dry mouth on admission. - Continue to monitor, consider head imaging if persists/worsens Chronic health problem T2DM - Sensitive SSI, monitor CBGs (expect elevation from steroid administration) Strep bacteremia 2/2 infected pacemaker - continue U IVPB Pen G Gout - continue allopurinol  Afib - continue eliquis  Schizophrenia - continue depakote , olanzapine  HLD - continue pravastatin  CKD - continue sodium bicarb  FEN/GI: Carb modified, heart healthy diet PPx: Home Eliquis  Dispo:Pending PT recommendations  pending clinical improvement   Subjective:  Feeling all right this morning.  Continues to have shortness of breath.  No chest pain.  Has a wet cough. Son Renay updated over the phone this AM. We discussed that the emergency line is reserved for emergencies to ensure proper triage of patient concerns. He understandably feels her elevated pro-BNP is an emergency. I directed him to the hospital main line to then be transferred to the RN taking care of his mother.   Objective: Temp:  [97.6 F (36.4 C)-98.1 F (36.7 C)] 97.6 F (36.4 C) (12/28 0341) Pulse Rate:  [59-77] 61 (12/28 0641) Resp:  [14-27] 19 (12/28 0641) BP: (119-159)/(50-97) 159/97 (12/28 0641) SpO2:  [92 %-98 %] 95 % (12/28 0641) Physical Exam: General: Sitting up in bed, no acute distress, alert Cardiovascular: Difficult to appreciate heart sounds due to body habitus, rate normal, bilateral lower extremities with trace pitting edema to level of the shin Respiratory: Difficult to appreciate fully due to body habitus, clear to auscultation anteriorly Abdomen: Soft, large abdominal  pannus, normoactive bowel sounds Neurologic: No gross focal deficit, speech  production/perception/content appears to be at her baseline from my prior interactions with her Extremities: Moves all grossly equally  Laboratory: Most recent CBC Lab Results  Component Value Date   WBC 10.2 01/11/2024   HGB 9.4 (L) 01/11/2024   HCT 30.5 (L) 01/11/2024   MCV 97.4 01/11/2024   PLT 148 (L) 01/11/2024   Most recent BMP    Latest Ref Rng & Units 01/12/2024    4:33 AM  BMP  Glucose 70 - 99 mg/dL 740   BUN 8 - 23 mg/dL 46   Creatinine 9.55 - 1.00 mg/dL 6.99   Sodium 864 - 854 mmol/L 135   Potassium 3.5 - 5.1 mmol/L 4.5   Chloride 98 - 111 mmol/L 99   CO2 22 - 32 mmol/L 23   Calcium  8.9 - 10.3 mg/dL 9.5    proBNP 2277 TSH 2.820 Magnesium  2.0  Tharon Lung, MD 01/12/2024, 7:21 AM  PGY-3, [facility] FPTS Intern pager: [phone], text pages welcome Secure chat group [facility] Teaching Service   "

## 2024-01-12 NOTE — Assessment & Plan Note (Signed)
 Chronic, relapsing issue.  Recent CT head on 12/20/2023 without acute abnormality.  Felt to be due to her shortness of breath versus dry mouth on admission. - Continue to monitor, consider head imaging if persists/worsens

## 2024-01-12 NOTE — Assessment & Plan Note (Signed)
 T2DM - Sensitive SSI, monitor CBGs (expect elevation from steroid administration) Strep bacteremia 2/2 infected pacemaker - continue U IVPB Pen G Gout - continue allopurinol  Afib - continue eliquis  Schizophrenia - continue depakote , olanzapine  HLD - continue pravastatin  CKD - continue sodium bicarb

## 2024-01-12 NOTE — Assessment & Plan Note (Addendum)
 Unclear how long this has been occurring, son states this typically happens when she is SOB. She has no focal neurological deficits. This could be 2/2 dry mouth - Continue to monitor, consider head imaging if persists/worsens

## 2024-01-12 NOTE — Assessment & Plan Note (Addendum)
 Son reports cloudy urine, dysuria, and flank pain x3 days. UA with leukocytes though nonspecific in the setting of her chronic indwelling Foley after polio as a child.  Recently treated for Klebsiella pneumonia UTI initially on Bactrim  though then switched to penicillin  alone for bacteremia as below; however, this Klebsiella was noted to be resistant to ampicillin and unclear if the pathogen has persisted given ampicillin and penicillin  likely have increased cross-reactivity. -Await urine culture -Consider consulting ID for potential change in antibiotics to cover both bacteremia and UTI if confirmed

## 2024-01-13 ENCOUNTER — Other Ambulatory Visit (HOSPITAL_COMMUNITY): Payer: Self-pay

## 2024-01-13 ENCOUNTER — Other Ambulatory Visit: Payer: Self-pay

## 2024-01-13 ENCOUNTER — Telehealth (HOSPITAL_COMMUNITY): Payer: Self-pay

## 2024-01-13 ENCOUNTER — Encounter (HOSPITAL_COMMUNITY): Payer: Self-pay | Admitting: Family Medicine

## 2024-01-13 ENCOUNTER — Telehealth: Admitting: Infectious Diseases

## 2024-01-13 DIAGNOSIS — N184 Chronic kidney disease, stage 4 (severe): Secondary | ICD-10-CM

## 2024-01-13 DIAGNOSIS — R531 Weakness: Secondary | ICD-10-CM | POA: Diagnosis not present

## 2024-01-13 DIAGNOSIS — I5023 Acute on chronic systolic (congestive) heart failure: Secondary | ICD-10-CM | POA: Diagnosis not present

## 2024-01-13 DIAGNOSIS — N179 Acute kidney failure, unspecified: Secondary | ICD-10-CM | POA: Diagnosis not present

## 2024-01-13 LAB — CBC WITH DIFFERENTIAL/PLATELET
Basophils Absolute: 0 K/uL (ref 0.0–0.1)
Basophils Relative: 0 %
Eosinophils Absolute: 0 K/uL (ref 0.0–0.5)
Eosinophils Relative: 0 %
HCT: 28.1 % — ABNORMAL LOW (ref 36.0–46.0)
Hemoglobin: 8.6 g/dL — ABNORMAL LOW (ref 12.0–15.0)
Lymphocytes Relative: 18 %
Lymphs Abs: 1.7 K/uL (ref 0.7–4.0)
MCH: 29.7 pg (ref 26.0–34.0)
MCHC: 30.6 g/dL (ref 30.0–36.0)
MCV: 96.9 fL (ref 80.0–100.0)
Monocytes Absolute: 0.2 K/uL (ref 0.1–1.0)
Monocytes Relative: 2 %
Neutro Abs: 7.4 K/uL (ref 1.7–7.7)
Neutrophils Relative %: 80 %
Platelets: 142 K/uL — ABNORMAL LOW (ref 150–400)
RBC: 2.9 MIL/uL — ABNORMAL LOW (ref 3.87–5.11)
RDW: 15.2 % (ref 11.5–15.5)
WBC: 9.2 K/uL (ref 4.0–10.5)
nRBC: 0 % (ref 0.0–0.2)

## 2024-01-13 LAB — BASIC METABOLIC PANEL WITH GFR
Anion gap: 12 (ref 5–15)
BUN: 46 mg/dL — ABNORMAL HIGH (ref 8–23)
CO2: 26 mmol/L (ref 22–32)
Calcium: 9.2 mg/dL (ref 8.9–10.3)
Chloride: 98 mmol/L (ref 98–111)
Creatinine, Ser: 3.05 mg/dL — ABNORMAL HIGH (ref 0.44–1.00)
GFR, Estimated: 15 mL/min — ABNORMAL LOW
Glucose, Bld: 175 mg/dL — ABNORMAL HIGH (ref 70–99)
Potassium: 3.6 mmol/L (ref 3.5–5.1)
Sodium: 135 mmol/L (ref 135–145)

## 2024-01-13 LAB — URINE CULTURE: Culture: >=100000 — AB

## 2024-01-13 LAB — GLUCOSE, CAPILLARY
Glucose-Capillary: 128 mg/dL — ABNORMAL HIGH (ref 70–99)
Glucose-Capillary: 145 mg/dL — ABNORMAL HIGH (ref 70–99)
Glucose-Capillary: 172 mg/dL — ABNORMAL HIGH (ref 70–99)
Glucose-Capillary: 157 mg/dL — ABNORMAL HIGH (ref 70–99)
Glucose-Capillary: 173 mg/dL — ABNORMAL HIGH (ref 70–99)
Glucose-Capillary: 166 mg/dL — ABNORMAL HIGH (ref 70–99)

## 2024-01-13 LAB — MAGNESIUM: Magnesium: 2 mg/dL (ref 1.7–2.4)

## 2024-01-13 MED ORDER — FUROSEMIDE 40 MG PO TABS
80.0000 mg | ORAL_TABLET | Freq: Every day | ORAL | Status: DC
  Administered 2024-01-13 – 2024-01-14 (×2): 80 mg via ORAL
  Filled 2024-01-13 (×2): qty 2

## 2024-01-13 MED ORDER — SODIUM CHLORIDE 0.9 % IV SOLN
1.0000 g | INTRAVENOUS | Status: DC
  Administered 2024-01-14 – 2024-01-16 (×3): 1 g via INTRAVENOUS
  Filled 2024-01-13 (×3): qty 10

## 2024-01-13 NOTE — Assessment & Plan Note (Addendum)
 No improvement in creatinine from yesterday, meeting AKI criteria at 3.05 from baseline ~2.4.  Possibly 2/2 diuresis given approximate dry weight versus UTI versus prerenal supply 2/2 CHF exacerbation. -Decreasing Lasix  as above, repeat BMP in morning to evaluate.  If continues to trend up can try increasing diuresis if looking more clinically hypervolemic.

## 2024-01-13 NOTE — Assessment & Plan Note (Addendum)
 Vital signs improving overnight, normotensive. BNP elevated from baseline on admission and found to be hypervolemic, though previous BNPs for comparison near same relative value.   - S/p  60 mg IV Lasix  yesterday, restart home furosemide  80 mg p.o. daily -Strict I's and O's, -700 cc, with extra 700 UOP overnight per nursing message -Weight up from yesterday afternoon 145.7 from 143 kg, though this is close to patient's dry weight -Consider consult to cardiology today for further recommendations -Continue home Trelegy, DuoNeb for known COPD -Wean O2 as tolerated - AM BMP, Mg, CBC

## 2024-01-13 NOTE — Assessment & Plan Note (Addendum)
 Hemoglobin chronically low, lower than baseline today at 8.6 with baseline around 9.5-10 and found to have basophilic stippling on smear review.  -Repeat a.m. CBC as above, will continue to trend, if decreasing can consider FOBT

## 2024-01-13 NOTE — Assessment & Plan Note (Addendum)
 Dysuria and flank pain for 3 days, found to be growing >100,000 Pseudomonas aeruginosa's, though sample contaminated catheter. -ID consulted, recommended continuing pen G and 5-day UTI treatment with Levaquin  after Foley exchange, pending susceptibility. -Spoke with pharmacy and will plan to start cefepime  today, given patient covered for 48 hours on Levaquin  for poor renal function and hopeful susceptibilities will result by tomorrow. -Foley was exchanged this morning -UCX growing >100,000 Pseudomonas aeruginosa's, pending susceptibilities

## 2024-01-13 NOTE — Assessment & Plan Note (Addendum)
 T2DM - Sensitive SSI, CBGs at goal, received 4 units sliding scale Strep bacteremia 2/2 infected pacemaker - continue U IVPB Pen G Gout - continue allopurinol  Afib - continue eliquis  Schizophrenia - continue depakote , olanzapine  HLD - continue pravastatin  CKD - continue sodium bicarb

## 2024-01-13 NOTE — Telephone Encounter (Signed)
 Spoke with patient and son in hospital. Abigail Daring, MD  [facility] Medicine Teaching Service

## 2024-01-13 NOTE — Progress Notes (Signed)
 "    Daily Progress Note Intern Pager: [phone]  Patient name: Abigail Wallace Medical record number: [id number] Date of birth: 03-21-1945 Age: 78 y.o. Gender: female  Primary Care Provider: Delores Suzann HERO, MD Consultants: Infectious disease Code Status: Full  Pt Overview and Major Events to Date:  12/27-admitted   Medical Decision Making:   Abigail Wallace [facility] is a 78 year old female presenting with dyspnea and generalized fatigue in the setting of CHF exacerbation and recent RSV infection. Pertinent PMH/PSH includes A-fib on Eliquis , CKD 3B, COPD, CAD, GERD, gout, hypertension, strep species bacteremia on chronic pen G, pacemaker due to sick sinus syndrome, schizophrenia, T2DM.  Improved respiratory status, clinically doing well though son is frustrated with care. Assessment & Plan Acute on chronic systolic heart failure (HCC) Fatigue Dyspnea Vital signs improving overnight, normotensive. BNP elevated from baseline on admission and found to be hypervolemic, though previous BNPs for comparison near same relative value.   - S/p  60 mg IV Lasix  yesterday, restart home furosemide  80 mg p.o. daily -Strict I's and O's, -700 cc, with extra 700 UOP overnight per nursing message -Weight up from yesterday afternoon 145.7 from 143 kg, though this is close to patient's dry weight -Consider consult to cardiology today for further recommendations -Continue home Trelegy, DuoNeb for known COPD -Wean O2 as tolerated - AM BMP, Mg, CBC AKI (acute kidney injury) No improvement in creatinine from yesterday, meeting AKI criteria at 3.05 from baseline ~2.4.  Possibly 2/2 diuresis given approximate dry weight versus UTI versus prerenal supply 2/2 CHF exacerbation. -Decreasing Lasix  as above, repeat BMP in morning to evaluate.  If continues to trend up can try increasing diuresis if looking more clinically hypervolemic. Dysuria Dysuria and flank pain for 3 days, found to be growing >100,000  Pseudomonas aeruginosa's, though sample contaminated catheter. -ID consulted, recommended continuing pen G and 5-day UTI treatment with Levaquin  after Foley exchange, pending susceptibility. -Spoke with pharmacy and will plan to start cefepime  today, given patient covered for 48 hours on Levaquin  for poor renal function and hopeful susceptibilities will result by tomorrow. -Foley was exchanged this morning -UCX growing >100,000 Pseudomonas aeruginosa's, pending susceptibilities Episode of change in speech (Resolved: 01/13/2024) Chronic, relapsing issue.  Much improved this morning/back to baseline. - Continue to monitor, consider head imaging if persists/worsens Anemia Hemoglobin chronically low, lower than baseline today at 8.6 with baseline around 9.5-10 and found to have basophilic stippling on smear review.  -Repeat a.m. CBC as above, will continue to trend, if decreasing can consider FOBT Chronic health problem T2DM - Sensitive SSI, CBGs at goal, received 4 units sliding scale Strep bacteremia 2/2 infected pacemaker - continue U IVPB Pen G Gout - continue allopurinol  Afib - continue eliquis  Schizophrenia - continue depakote , olanzapine  HLD - continue pravastatin  CKD - continue sodium bicarb  FEN/GI: Heart healthy/carb modified PPx: Home Eliquis  Dispo:Home with home health pending clinical improvement . Barriers include AKI, CHF treatment.   Subjective:  Saw patient at bedside with son Abigail Wallace.  Son expressed frustration with patient's care regarding discharge from last hospitalization and presumption that he was not administering her Lasix  as prescribed.  He stated that she was getting all of her doses of Lasix .  Overall patient reports feeling slightly better from yesterday, though some minor work of breathing not abnormal from her baseline.  Son Abigail Wallace was asking what more can be done for her care and I stated that sometimes being in a facility would be more helpful as  they have  more accessibility to testing and immediate evaluation versus needing to come to the emergency department so frequently.  He expressed frustration with long-term care facilities/SNF's and stated that was how his father died.  Son did state that her speech appears to be back to his normal baseline.  Objective: Temp:  [97.6 F (36.4 C)-98.9 F (37.2 C)] 98.3 F (36.8 C) (12/29 0455) Pulse Rate:  [59-79] 59 (12/29 0455) Resp:  [15-20] 20 (12/29 0455) BP: (107-170)/(63-115) 107/63 (12/29 0455) SpO2:  [95 %-100 %] 95 % (12/29 0455) Weight:  [143 kg-145.7 kg] 145.7 kg (12/29 0500) Physical Exam: General: Resting comfortably in bed Cardiovascular: Distant heart sounds due to body habitus, rate normal, no appreciable gallops, JVD to 3 cm, trace 1+ bilateral pitting edema to distal 1/3 tibia. Respiratory: Clear to auscultation bilaterally, no work of breathing Abdomen: Active bowel sounds, obese abdomen, soft, nontender to palpation Extremities: Moves all grossly equally  Laboratory: Most recent CBC Lab Results  Component Value Date   WBC 9.2 01/13/2024   HGB 8.6 (L) 01/13/2024   HCT 28.1 (L) 01/13/2024   MCV 96.9 01/13/2024   PLT 142 (L) 01/13/2024   Most recent BMP    Latest Ref Rng & Units 01/13/2024    3:45 AM  BMP  Glucose 70 - 99 mg/dL 824   BUN 8 - 23 mg/dL 46   Creatinine 9.55 - 1.00 mg/dL 6.94   Sodium 864 - 854 mmol/L 135   Potassium 3.5 - 5.1 mmol/L 3.6   Chloride 98 - 111 mmol/L 98   CO2 22 - 32 mmol/L 26   Calcium  8.9 - 10.3 mg/dL 9.2     Other pertinent labs positive basophilic stippling and polychromasia  Imaging/Diagnostic Tests: No new imaging  Abigail Pac, MD 01/13/2024, 7:15 AM  PGY-1, [place] [facility] FPTS Intern pager: [phone], text pages welcome Secure chat group [place] [facility] Teaching Service   "

## 2024-01-13 NOTE — Plan of Care (Signed)

## 2024-01-13 NOTE — Assessment & Plan Note (Addendum)
 Chronic, relapsing issue.  Much improved this morning/back to baseline. - Continue to monitor, consider head imaging if persists/worsens

## 2024-01-13 NOTE — Progress Notes (Signed)
 Heart Failure Navigator Progress Note  Assessed for Heart & Vascular TOC clinic readiness.  Patient does not meet criteria due to per MD note patient with history of Dementia. No HF TOC. .   Navigator will sign off at this time.   Randie Bustle, BSN, Scientist, clinical (histocompatibility and immunogenetics) Only

## 2024-01-13 NOTE — Telephone Encounter (Signed)

## 2024-01-13 NOTE — Progress Notes (Addendum)
 Pharmacy Antibiotic Note  Abigail Wallace is a 78 y.o. female admitted on 01/11/2024 with UTI.  Pharmacy has been consulted for cefepime  dosing.  Note, patient has documented allergy to cephalexin  from 2021 (tongue swelling). Recently was given with ceftriaxone  12/2023 with no reported issues following several doses. Given levofloxacin  750mg  IV x1 12/28 PM. Foley to be changed today.  Plan: x1 levofloxacin  750mg  IV given 12/28 PM (Q48h due to renal function) Cefepime  1g IV Q24h starting 12/30 PM Trend WBC, fever, renal function F/u cultures, clinical progress  De-escalate when able   Height: 5' 6 (167.6 cm) Weight: (!) 145.7 kg (321 lb 3.4 oz) IBW/kg (Calculated) : 59.3  Temp (24hrs), Avg:98 F (36.7 C), Min:97.6 F (36.4 C), Max:98.3 F (36.8 C)  Recent Labs  Lab 01/11/24 1615 01/11/24 1628 01/11/24 1940 01/12/24 0433 01/13/24 0345  WBC 10.2  --   --   --  9.2  CREATININE 2.90*  --   --  3.00* 3.05*  LATICACIDVEN  --  2.1* 1.3  --   --     Estimated Creatinine Clearance: 21.8 mL/min (A) (by C-G formula based on SCr of 3.05 mg/dL (H)).    Allergies[1]  Microbiology results: 12/28 UCx: 100k PsA (pan sensitive)   Thank you for allowing pharmacy to be a part of this patients care.  [name], PharmD, BCPS Clinical Pharmacist     [1]  Allergies Allergen Reactions   Abilify [Aripiprazole] Anaphylaxis, Shortness Of Breath and Other (See Comments)    Tremors, also   Keflex  [Cephalexin ] Swelling and Other (See Comments)    Tongue swelling 12/2023 tolerated ceftriaxone  with no reported issues   Versacloz  [Clozapine ] Anaphylaxis   Ativan  [Lorazepam ] Other (See Comments)    Unknown reaction   Benadryl [Diphenhydramine Hcl] Other (See Comments)    Vision changes   Benztropine  Other (See Comments)    Mobility issues   Latuda [Lurasidone Hcl] Other (See Comments)    Tremors   Remeron [Mirtazapine] Other (See Comments)    Tremors   Haldol   [Haloperidol  Lactate] Other (See Comments)    Tardive dyskinesias  Tremors Somnolence Speech changes Sialorrhea    Codeine Other (See Comments)    Unknown reaction   Latex Rash

## 2024-01-14 ENCOUNTER — Other Ambulatory Visit: Admitting: Family Medicine

## 2024-01-14 ENCOUNTER — Telehealth: Payer: Self-pay

## 2024-01-14 DIAGNOSIS — N179 Acute kidney failure, unspecified: Secondary | ICD-10-CM

## 2024-01-14 DIAGNOSIS — R531 Weakness: Principal | ICD-10-CM

## 2024-01-14 DIAGNOSIS — I5023 Acute on chronic systolic (congestive) heart failure: Secondary | ICD-10-CM | POA: Diagnosis not present

## 2024-01-14 DIAGNOSIS — N184 Chronic kidney disease, stage 4 (severe): Secondary | ICD-10-CM | POA: Diagnosis not present

## 2024-01-14 LAB — BASIC METABOLIC PANEL WITH GFR
Anion gap: 11 (ref 5–15)
BUN: 44 mg/dL — ABNORMAL HIGH (ref 8–23)
CO2: 26 mmol/L (ref 22–32)
Calcium: 9.5 mg/dL (ref 8.9–10.3)
Chloride: 99 mmol/L (ref 98–111)
Creatinine, Ser: 3.23 mg/dL — ABNORMAL HIGH (ref 0.44–1.00)
GFR, Estimated: 14 mL/min — ABNORMAL LOW
Glucose, Bld: 141 mg/dL — ABNORMAL HIGH (ref 70–99)
Potassium: 3.8 mmol/L (ref 3.5–5.1)
Sodium: 135 mmol/L (ref 135–145)

## 2024-01-14 LAB — CBC
HCT: 29.6 % — ABNORMAL LOW (ref 36.0–46.0)
Hemoglobin: 9.1 g/dL — ABNORMAL LOW (ref 12.0–15.0)
MCH: 29.8 pg (ref 26.0–34.0)
MCHC: 30.7 g/dL (ref 30.0–36.0)
MCV: 97 fL (ref 80.0–100.0)
Platelets: 156 K/uL (ref 150–400)
RBC: 3.05 MIL/uL — ABNORMAL LOW (ref 3.87–5.11)
RDW: 15.3 % (ref 11.5–15.5)
WBC: 10 K/uL (ref 4.0–10.5)
nRBC: 0 % (ref 0.0–0.2)

## 2024-01-14 LAB — GLUCOSE, CAPILLARY
Glucose-Capillary: 142 mg/dL — ABNORMAL HIGH (ref 70–99)
Glucose-Capillary: 113 mg/dL — ABNORMAL HIGH (ref 70–99)
Glucose-Capillary: 167 mg/dL — ABNORMAL HIGH (ref 70–99)
Glucose-Capillary: 163 mg/dL — ABNORMAL HIGH (ref 70–99)

## 2024-01-14 LAB — MAGNESIUM: Magnesium: 1.9 mg/dL (ref 1.7–2.4)

## 2024-01-14 MED ORDER — ACETAMINOPHEN 325 MG PO TABS
650.0000 mg | ORAL_TABLET | Freq: Four times a day (QID) | ORAL | Status: DC | PRN
  Administered 2024-01-14 – 2024-01-16 (×3): 650 mg via ORAL
  Filled 2024-01-14 (×3): qty 2

## 2024-01-14 MED ORDER — FUROSEMIDE 10 MG/ML IJ SOLN
20.0000 mg | Freq: Once | INTRAMUSCULAR | Status: AC
  Administered 2024-01-14: 20 mg via INTRAVENOUS
  Filled 2024-01-14: qty 2

## 2024-01-14 MED ORDER — ACETAMINOPHEN 325 MG PO TABS
650.0000 mg | ORAL_TABLET | Freq: Once | ORAL | Status: AC
  Administered 2024-01-14: 650 mg via ORAL
  Filled 2024-01-14: qty 2

## 2024-01-14 NOTE — Assessment & Plan Note (Addendum)
 Mild crackles at bilateral lung bases. 1400 cc UOP overnight.  - will dose 80 mg PO and another 20 IV this PM for total of 120 po today- consider redosing tomorrow  - continue strict I/Os, daily weights - continue breztri , albuterol  prn - currently on 2L [place], which is her home O2 requirement, titrate prn  - AM BMP, Mag

## 2024-01-14 NOTE — Progress Notes (Signed)
 "    Daily Progress Note Intern Pager: [phone]  Patient name: Abigail Wallace [facility] Medical record number: [id number] Date of birth: 1945/05/24 Age: 78 y.o. Gender: female  Primary Care Provider: Delores Suzann HERO, MD Consultants: ID Code Status: Full  Pt Overview and Major Events to Date:  12/21: Admitted  Medical Decision Making: Abigail Wallace is an 78 year old female who presented with dyspnea and generalized fatigue likely secondary to CHF exacerbation and recent RSV infection. Patient stable this AM.   Patient recently admitted with strep bacteremia, is currently still on penicillin  G.  Pertinent past medical history includes A-fib on Eliquis , CKD 3B, COPD, CAD, GERD, gout, HTN, SSS s/p pacemaker, schizophrenia, type II DM. Assessment & Plan Acute on chronic systolic heart failure (HCC) Fatigue Dyspnea with Chronic COPD Mild crackles at bilateral lung bases. 1400 cc UOP overnight.  - will dose 80 mg PO and another 20 IV this PM for total of 120 po today- consider redosing tomorrow  - continue strict I/Os, daily weights - continue breztri , albuterol  prn - currently on 2L [place], which is her home O2 requirement, titrate prn  - AM BMP, Mag AKI (acute kidney injury) Cr uptrending to 3.23 from 3.05 yesterday. Suspect likely 2/2 to underdiueresis. Will plan to spot dose 1x 60 IV lasix  today and continue to trend. -  AM BMP UTI - Foley exchanged 12/29 - Ucx showed psuedomonas sensitive to cefepime , will continue cefepime  1g q24h for renal adjustment, consider transition to PO levoquin at discharge  Anemia Hg back to baseline at 9.1 this AM.  - intermittently monitor, will obtain CBC prn if concern for acute bleed  Chronic health problem T2DM - CBG QID, Sensitive SSI Strep bacteremia 2/2 infected pacemaker - continue 12million U IVPB Pen G, patient to complete ~6 weeks from 12/12 (1/23). Will reach out to ID for OPAT f/u at discharge  Gout - continue allopurinol  Afib -  continue eliquis  Schizophrenia - continue depakote , olanzapine  HLD - continue pravastatin  CKD - continue sodium bicarb  FEN/GI: Carb modified PPx: Eliquis  2.5 twice daily Dispo: Likely home pending clinical improvement, patient is improving and approaching baseline  Subjective:  Resting comfortably on my exam, NAD. Sleeping but arousable to voice.   Objective: Temp:  [97.6 F (36.4 C)-98.5 F (36.9 C)] 97.9 F (36.6 C) (12/30 0435) Pulse Rate:  [59-72] 59 (12/30 0435) Resp:  [16-18] 16 (12/30 0435) BP: (121-149)/(53-91) 130/70 (12/30 0435) SpO2:  [95 %-100 %] 98 % (12/30 0435) Weight:  [147.8 kg] 147.8 kg (12/30 0500) Physical Exam: General: chronically ill appearing female, NAD Cardiovascular: distant heart sounds, no m/r/g Respiratory: distant breath sounds, mild crackles at bilateral lung bases Abdomen: obese, nontender Extremities: 1+ edema at bilateral lower extremities   Laboratory: Most recent CBC Lab Results  Component Value Date   WBC 10.0 01/14/2024   HGB 9.1 (L) 01/14/2024   HCT 29.6 (L) 01/14/2024   MCV 97.0 01/14/2024   PLT 156 01/14/2024   Most recent BMP    Latest Ref Rng & Units 01/14/2024    4:07 AM  BMP  Glucose 70 - 99 mg/dL 858   BUN 8 - 23 mg/dL 44   Creatinine 9.55 - 1.00 mg/dL 6.76   Sodium 864 - 854 mmol/L 135   Potassium 3.5 - 5.1 mmol/L 3.8   Chloride 98 - 111 mmol/L 99   CO2 22 - 32 mmol/L 26   Calcium  8.9 - 10.3 mg/dL 9.5    Urine Culture (87/72) Component Ref  Range & Units (hover) 3 d ago  Specimen Description URINE, CATHETERIZED  Special Requests NONE Performed at [facility], [address] [place], [place] [address]  Culture >=100,000 COLONIES/mL PSEUDOMONAS AERUGINOSA Abnormal   Report Status 01/13/2024 FINAL  Organism ID, Bacteria PSEUDOMONAS AERUGINOSA Abnormal   Resulting Agency [facility]     Susceptibility   Pseudomonas aeruginosa    MIC    CEFEPIME  2 SENSITIVE Sensitive    CEFTAZIDIME 2 SENSITIVE  Sensitive    CEFTAZIDIME/AVIBACTAM 2 SENSITIVE Sensitive    CEFTOLOZANE/TAZOBACTAM 1 SENSITIVE Sensitive    IMIPENEM 2 SENSITIVE Sensitive    MEROPENEM <=0.25 SENS... Sensitive    PIP/TAZO  Sensitive 1    TOBRAMYCIN <=1 SENSITIVE Sensitive       Imaging/Diagnostic Tests: No new imaging Lonnie Rosi Secrist, MD 01/14/2024, 7:46 AM  PGY-2, [place] [facility] FPTS Intern pager: [phone], text pages welcome Secure chat group [facility] Teaching Service   "

## 2024-01-14 NOTE — Assessment & Plan Note (Addendum)
 T2DM - CBG QID, Sensitive SSI Strep bacteremia 2/2 infected pacemaker - continue 12million U IVPB Pen G, patient to complete ~6 weeks from 12/12 (1/23). Will reach out to ID for OPAT f/u at discharge  Gout - continue allopurinol  Afib - continue eliquis  Schizophrenia - continue depakote , olanzapine  HLD - continue pravastatin  CKD - continue sodium bicarb

## 2024-01-14 NOTE — Plan of Care (Signed)

## 2024-01-14 NOTE — Plan of Care (Signed)
 Attempted to call patient's son per request, unable to reach, left generic voicemail. Plan for member of team to reach out tomorrow as able.

## 2024-01-14 NOTE — Assessment & Plan Note (Addendum)
-   Foley exchanged 12/29 - Ucx showed psuedomonas sensitive to cefepime , will continue cefepime  1g q24h for renal adjustment, consider transition to PO levoquin at discharge

## 2024-01-14 NOTE — Assessment & Plan Note (Addendum)
 Hg back to baseline at 9.1 this AM.  - intermittently monitor, will obtain CBC prn if concern for acute bleed

## 2024-01-14 NOTE — Assessment & Plan Note (Addendum)
 Cr uptrending to 3.23 from 3.05 yesterday. Suspect likely 2/2 to underdiueresis. Will plan to spot dose 1x 60 IV lasix  today and continue to trend. -  AM BMP

## 2024-01-14 NOTE — Telephone Encounter (Signed)
 Patient calls nurse line requesting to speak to Dr. Rosalynn.   He reports he would like to discuss his mothers care with her. He reports poor communication from the residents.  Advised will forward to PCP and Dr. Rosalynn.

## 2024-01-15 LAB — MAGNESIUM: Magnesium: 1.9 mg/dL (ref 1.7–2.4)

## 2024-01-15 LAB — BASIC METABOLIC PANEL WITH GFR
Anion gap: 12 (ref 5–15)
BUN: 51 mg/dL — ABNORMAL HIGH (ref 8–23)
CO2: 25 mmol/L (ref 22–32)
Calcium: 9.4 mg/dL (ref 8.9–10.3)
Chloride: 96 mmol/L — ABNORMAL LOW (ref 98–111)
Creatinine, Ser: 3.88 mg/dL — ABNORMAL HIGH (ref 0.44–1.00)
GFR, Estimated: 11 mL/min — ABNORMAL LOW
Glucose, Bld: 160 mg/dL — ABNORMAL HIGH (ref 70–99)
Potassium: 3.8 mmol/L (ref 3.5–5.1)
Sodium: 133 mmol/L — ABNORMAL LOW (ref 135–145)

## 2024-01-15 LAB — GLUCOSE, CAPILLARY
Glucose-Capillary: 136 mg/dL — ABNORMAL HIGH (ref 70–99)
Glucose-Capillary: 147 mg/dL — ABNORMAL HIGH (ref 70–99)
Glucose-Capillary: 137 mg/dL — ABNORMAL HIGH (ref 70–99)

## 2024-01-15 MED ORDER — FUROSEMIDE 40 MG PO TABS
40.0000 mg | ORAL_TABLET | Freq: Every day | ORAL | Status: DC
  Administered 2024-01-15 – 2024-01-16 (×2): 40 mg via ORAL
  Filled 2024-01-15 (×2): qty 1

## 2024-01-15 MED ORDER — GLYCERIN (LAXATIVE) 2 G RE SUPP
1.0000 | Freq: Once | RECTAL | Status: AC
  Administered 2024-01-15: 1 via RECTAL
  Filled 2024-01-15: qty 1

## 2024-01-15 MED ORDER — MELATONIN 3 MG PO TABS
3.0000 mg | ORAL_TABLET | Freq: Every evening | ORAL | Status: DC | PRN
  Administered 2024-01-15: 3 mg via ORAL
  Filled 2024-01-15: qty 1

## 2024-01-15 MED ORDER — INSULIN ASPART 100 UNIT/ML IJ SOLN
0.0000 [IU] | Freq: Three times a day (TID) | INTRAMUSCULAR | Status: DC
  Administered 2024-01-16: 2 [IU] via SUBCUTANEOUS
  Filled 2024-01-15: qty 2

## 2024-01-15 MED ORDER — PHENOL 1.4 % MT LIQD
1.0000 | OROMUCOSAL | Status: DC | PRN
  Administered 2024-01-15: 1 via OROMUCOSAL
  Filled 2024-01-15: qty 177

## 2024-01-15 NOTE — Telephone Encounter (Signed)
 [name] calls nurse line this morning.  He reports he apologizes for missing Dr. Malone phone call yesterday.   He reports he spoke with his mother this morning, however she seemed a bit confused about her care.   He is requesting a return call from Dr. Rosalynn.   Will forward.

## 2024-01-15 NOTE — Assessment & Plan Note (Addendum)
 See above. Will decrease lasix  today.

## 2024-01-15 NOTE — Telephone Encounter (Signed)
 I was present when we spoke with [name] via speaker phone this afternoon and answered all questions. He has not yet seen her today. She is talking with me on my visit and remembered my name. He is concerned because she is a bit confused at times and I believe that is her baseline. I spoke with her today and we also mentioned to him that we believe she should be ready for discharge home tomorrow unless something new arises. She is adequately diuresed. Her Creatinine had bumped slightly as we expected and I suspect it wil begin to downtrend by tomorrow as we return to less aggressive diuresis.  [name] is under a lot of stress as primary caretaker. I understand his concerns.

## 2024-01-15 NOTE — Plan of Care (Addendum)
 Called patients son, Renay, to discuss care. Answered all questions. He would like to be updated daily. Informed him we will try our best. Will ensure we communicate prior to dc  Addendum: 5:49 pm Received note fron RN that per [name] (sons) request, patient only to receive correction in insulin  if >200. Will adjust order accordingly.

## 2024-01-15 NOTE — Assessment & Plan Note (Addendum)
 Documented 1600 mL UOP overnight. Difficult to assess volume status, suspect may be 2/2 to overduiresis.  - Cr bump today from 3.2 > 3.8, will decrease lasix  dose to 40mg  today and follow Cr  - continue strict I/Os, daily weights - continue breztri , albuterol  prn - currently on 2L [place], which is her home O2 requirement, titrate prn  - AM BMP, Mag

## 2024-01-15 NOTE — Progress Notes (Addendum)
" ° ° ° °  Daily Progress Note Intern Pager: [phone]  Patient name: Abigail Wallace [facility] Medical record number: [id number] Date of birth: Oct 15, 1945 Age: 78 y.o. Gender: female  Primary Care Provider: Delores Suzann HERO, MD Consultants: ID Code Status: Full  Pt Overview and Major Events to Date:  12/21: admitted   Medical Decision Making: Patient is 78 yo F initially admitted for fatigue 2/2 to CHF exacerbation and RSV infection also found to have UTI. Patient now appears at baseline, but her Cr continues to rise.   Assessment & Plan Acute on chronic systolic heart failure (HCC) Fatigue Dyspnea with Chronic COPD Documented 1600 mL UOP overnight. Difficult to assess volume status, suspect may be 2/2 to overduiresis.  - Cr bump today from 3.2 > 3.8, will decrease lasix  dose to 40mg  today and follow Cr  - continue strict I/Os, daily weights - continue breztri , albuterol  prn - currently on 2L [place], which is her home O2 requirement, titrate prn  - AM BMP, Mag AKI (acute kidney injury) See above. Will decrease lasix  today.  UTI - Foley exchanged 12/29 - Ucx showed psuedomonas sensitive to cefepime , will continue cefepime  1g q24h for renal adjustment (12/30 -), consider transition to PO levoquin at discharge for total 5 d course Anemia (Resolved: 01/15/2024) Hg back to baseline at 9.1 this AM.  - intermittently monitor, will obtain CBC prn if concern for acute bleed  Chronic health problem T2DM - CBG QID, Sensitive SSI Strep bacteremia 2/2 infected pacemaker - continue 12million U IVPB Pen G, patient to complete ~6 weeks from 12/12 (1/23). Will reach out to ID for OPAT f/u at discharge  Gout - continue allopurinol  Afib - continue eliquis  Schizophrenia - continue depakote , olanzapine  HLD - continue pravastatin  CKD - continue sodium bicarb  FEN/GI: heart healthy  PPx: eliquis   Dispo: patient is approaching medical stability, likely d/c 01/16/2023  Subjective:  Patient states she feels  her breathing is much better today. She is wondering when she can go home.   Objective: Temp:  [97.5 F (36.4 C)-98.3 F (36.8 C)] 98.3 F (36.8 C) (12/30 2321) Pulse Rate:  [59-72] 70 (12/31 0750) Resp:  [18-22] 18 (12/31 0750) BP: (119-153)/(58-85) 145/85 (12/31 0750) SpO2:  [95 %-100 %] 97 % (12/31 0750) Weight:  [147.8 kg] 147.8 kg (12/31 0439) Physical Exam: General: chronically ill appearing, NAD Cardiovascular: distant heart sounds, difficult to assess Respiratory: difficult to asses, did appreciate mild crackles at bases Abdomen: obese, NTND Extremities: did not appreciate any LE edema on exam   Laboratory: Most recent CBC Lab Results  Component Value Date   WBC 10.0 01/14/2024   HGB 9.1 (L) 01/14/2024   HCT 29.6 (L) 01/14/2024   MCV 97.0 01/14/2024   PLT 156 01/14/2024   Most recent BMP    Latest Ref Rng & Units 01/15/2024    1:42 AM  BMP  Glucose 70 - 99 mg/dL 839   BUN 8 - 23 mg/dL 51   Creatinine 9.55 - 1.00 mg/dL 6.11   Sodium 864 - 854 mmol/L 133   Potassium 3.5 - 5.1 mmol/L 3.8   Chloride 98 - 111 mmol/L 96   CO2 22 - 32 mmol/L 25   Calcium  8.9 - 10.3 mg/dL 9.4      Imaging/Diagnostic Tests: None Abigail Wallace, Abigail Jakubowicz, MD 01/15/2024, 7:59 AM  PGY-2, [place]  [facility] FPTS Intern pager: [phone], text pages welcome Secure chat group [facility] Teaching Service   "

## 2024-01-15 NOTE — Assessment & Plan Note (Addendum)
 T2DM - CBG QID, Sensitive SSI Strep bacteremia 2/2 infected pacemaker - continue 12million U IVPB Pen G, patient to complete ~6 weeks from 12/12 (1/23). Will reach out to ID for OPAT f/u at discharge  Gout - continue allopurinol  Afib - continue eliquis  Schizophrenia - continue depakote , olanzapine  HLD - continue pravastatin  CKD - continue sodium bicarb

## 2024-01-15 NOTE — Plan of Care (Signed)

## 2024-01-15 NOTE — Assessment & Plan Note (Addendum)
-   Foley exchanged 12/29 - Ucx showed psuedomonas sensitive to cefepime , will continue cefepime  1g q24h for renal adjustment (12/30 -), consider transition to PO levoquin at discharge for total 5 d course

## 2024-01-15 NOTE — Assessment & Plan Note (Addendum)
 Hg back to baseline at 9.1 this AM.  - intermittently monitor, will obtain CBC prn if concern for acute bleed

## 2024-01-16 ENCOUNTER — Telehealth: Payer: Self-pay

## 2024-01-16 ENCOUNTER — Encounter: Payer: Self-pay | Admitting: Family Medicine

## 2024-01-16 LAB — BASIC METABOLIC PANEL WITH GFR
Anion gap: 13 (ref 5–15)
BUN: 50 mg/dL — ABNORMAL HIGH (ref 8–23)
CO2: 25 mmol/L (ref 22–32)
Calcium: 9.6 mg/dL (ref 8.9–10.3)
Chloride: 100 mmol/L (ref 98–111)
Creatinine, Ser: 3.64 mg/dL — ABNORMAL HIGH (ref 0.44–1.00)
GFR, Estimated: 12 mL/min — ABNORMAL LOW
Glucose, Bld: 150 mg/dL — ABNORMAL HIGH (ref 70–99)
Potassium: 3.6 mmol/L (ref 3.5–5.1)
Sodium: 137 mmol/L (ref 135–145)

## 2024-01-16 LAB — GLUCOSE, CAPILLARY
Glucose-Capillary: 147 mg/dL — ABNORMAL HIGH (ref 70–99)
Glucose-Capillary: 185 mg/dL — ABNORMAL HIGH (ref 70–99)
Glucose-Capillary: 237 mg/dL — ABNORMAL HIGH (ref 70–99)

## 2024-01-16 LAB — MAGNESIUM: Magnesium: 2.2 mg/dL (ref 1.7–2.4)

## 2024-01-16 MED ORDER — FUROSEMIDE 40 MG PO TABS: 40.0000 mg | ORAL_TABLET | Freq: Every day | ORAL | 0 refills | Status: AC

## 2024-01-16 MED ORDER — LEVOFLOXACIN 750 MG PO TABS: 750.0000 mg | ORAL_TABLET | Freq: Every day | ORAL | 0 refills | Status: AC

## 2024-01-16 MED ORDER — ALLOPURINOL 100 MG PO TABS: 50.0000 mg | ORAL_TABLET | Freq: Every morning | ORAL | 1 refills | Status: AC

## 2024-01-16 MED ORDER — PENICILLIN G POTASSIUM IV (FOR PTA / DISCHARGE USE ONLY): 12.0000 10*6.[IU] | INTRAVENOUS | 0 refills | Status: AC

## 2024-01-16 NOTE — Assessment & Plan Note (Addendum)
 Cr still elevated above baseline, but improved.  - AM BMP if remains inpatient.

## 2024-01-16 NOTE — Assessment & Plan Note (Addendum)
 Respiratory function much improved.  Cr improved with reduction of lasix  from 80 to 40 mg po.  - continue strict I/Os, daily weights - continue breztri , albuterol  prn, incentive spirometer  - currently on 2L [place], which is her home O2 requirement, titrate prn  - AM BMP, Mag

## 2024-01-16 NOTE — Assessment & Plan Note (Addendum)
 T2DM - CBG QID, Sensitive SSI Strep bacteremia 2/2 infected pacemaker - continue 12million U IVPB Pen G, patient to complete ~6 weeks from 12/12 (1/23). Will reach out to ID for OPAT f/u at discharge  Gout - continue allopurinol  Afib - continue eliquis  Schizophrenia - continue depakote , olanzapine  HLD - continue pravastatin  CKD - continue sodium bicarb

## 2024-01-16 NOTE — Progress Notes (Signed)
" ° ° ° °  Daily Progress Note Intern Pager: [phone]  Patient name: Abigail Wallace [facility] Medical record number: [id number] Date of birth: [birth year] Age: 79 y.o. Gender: female  Primary Care Provider: Delores Suzann HERO, MD Consultants: ID Code Status: Full   Pt Overview and Major Events to Date:  12/21: admitted     Medical Decision Making: Patient is 79 yo F initially admitted for fatigue 2/2 to CHF exacerbation and RSV infection also found to have UTI. Patient now appears at baseline in terms of volume status, respiratory status, Cr improved from yesterday.  Assessment & Plan Acute on chronic systolic heart failure (HCC) Fatigue Dyspnea with Chronic COPD Respiratory function much improved.  Cr improved with reduction of lasix  from 80 to 40 mg po.  - continue strict I/Os, daily weights - continue breztri , albuterol  prn, incentive spirometer  - currently on 2L [place], which is her home O2 requirement, titrate prn  - AM BMP, Mag AKI (acute kidney injury) Cr still elevated above baseline, but improved.  - AM BMP if remains inpatient.  UTI - Foley exchanged 12/29 - Ucx showed psuedomonas sensitive to cefepime , will continue cefepime  1g q24h for renal adjustment, plan to transition to PO levoquin at discharge for total 5 d course Chronic health problem T2DM - CBG QID, Sensitive SSI Strep bacteremia 2/2 infected pacemaker - continue 12million U IVPB Pen G, patient to complete ~6 weeks from 12/12 (1/23). Will reach out to ID for OPAT f/u at discharge  Gout - continue allopurinol  Afib - continue eliquis  Schizophrenia - continue depakote , olanzapine  HLD - continue pravastatin  CKD - continue sodium bicarb  FEN/GI: heart healthy  PPx: eliquis   Dispo: patient is approaching medical stability, likely d/c 01/16/2023  Subjective:  Patient reports some difficulty with deep breathing, otherwise no concerns.   Objective: Temp:  [97.7 F (36.5 C)-98.6 F (37 C)] 97.7 F (36.5 C) (01/01  0811) Pulse Rate:  [64-75] 73 (01/01 1209) Resp:  [18-20] 18 (01/01 0811) BP: (108-153)/(39-133) 144/72 (01/01 1209) SpO2:  [98 %-99 %] 98 % (01/01 1209) Weight:  [152.1 kg] 152.1 kg (01/01 0700) Physical Exam: General: chronically ill appearing, NAD Cardiovascular: distant heart sounds, difficult to assess Respiratory: normal work of breathing on 2 L, CTAB, faint crackles at left base but much clearer compared to prior  Extremities: No LE edema   Laboratory:  Most recent BMP    Latest Ref Rng & Units 01/16/2024    8:03 AM  BMP  Glucose 70 - 99 mg/dL 849   BUN 8 - 23 mg/dL 50   Creatinine 9.55 - 1.00 mg/dL 6.35   Sodium 864 - 854 mmol/L 137   Potassium 3.5 - 5.1 mmol/L 3.6   Chloride 98 - 111 mmol/L 100   CO2 22 - 32 mmol/L 25   Calcium  8.9 - 10.3 mg/dL 9.6     Nicholas Bar, MD 01/16/2024, 12:50 PM  PGY-3, [place] [facility] FPTS Intern pager: [phone], text pages welcome Secure chat group [facility] Teaching Service   "

## 2024-01-16 NOTE — Discharge Instructions (Signed)
 Dear Abigail Wallace [facility],   Thank you so much for allowing us  to be part of your care!  You were admitted to [facility] for hypoxia with oxygen  saturation below 88%. You were treated with diuretics and increased nebulizer use. You had elevated creatinine (worse kidney function) which improved at the end of your hospital stay with reduction of lasix . Unfortunately this is a balancing act with maintaining good fluid balance while not stressing the kidneys. Your amlodipine  was discontinued before discharge due to lower blood pressures.   You may have cough for a few weeks. Use your nebs as needed for this.    POST-HOSPITAL & CARE INSTRUCTIONS If you have worsening shortness of breath please let the primary care doctor know  Coordinate home visit with Dr. Delores  Recommend BMP labs in about a week to monitor creatinine  Please let PCP/Specialists know of any changes that were made.  Please see medications section of this packet for any medication changes.   DOCTOR'S APPOINTMENT & FOLLOW UP CARE INSTRUCTIONS  Future Appointments  Date Time Provider Department Center  01/30/2024  1:30 PM Dea Shiner, MD [facility]  03/30/2024  7:05 AM CVD HVT DEVICE REMOTES [facility]  06/29/2024  7:05 AM CVD HVT DEVICE REMOTES [facility]  09/28/2024  7:05 AM CVD HVT DEVICE REMOTES [facility]  12/28/2024  7:05 AM CVD HVT DEVICE REMOTES [facility] H&V    Take care and be well!  Family Medicine Teaching Service  [place]  [facility]  [address] [place], [place] [address] [phone]

## 2024-01-16 NOTE — Discharge Summary (Signed)
 "  Family Medicine Teaching [facility] Discharge Summary  Patient name: Abigail Wallace [facility] Medical record number: [id number] Date of birth: 17-Feb-1945 Age: 79 y.o. Gender: female Date of Admission: 01/11/2024  Date of Discharge: 01/16/2024   Admitting Physician: Elyce Prescott, DO  Primary Care Provider: Delores Suzann HERO, MD Consultants: None  Indication for Hospitalization: Hypoxia, dyspnea   Discharge Diagnoses/Problem List:  Principal Problem for Admission: Acute on chronic systolic heart failure  Other Problems addressed during stay:  Principal Problem:   Acute on chronic systolic heart failure (HCC) Active Problems:   UTI   Dyspnea with Chronic COPD   AKI (acute kidney injury)   Chronic health problem   Bacteremia   Fatigue   Dehydration   Stage 4 chronic kidney disease (HCC)   Generalized weakness    Brief Hospital Course:  Jamar Casagrande [place] [facility] is a 79 y.o.female with a history of CHF, Afib with pacemaker, COPD, who was admitted to the [facility] Medicine Teaching Service at [facility] for dyspnea. Her hospital course is detailed below:  Acute on chronic CHF Patient recently discharged on 01/03/2024 continued to have worsening dyspnea at home despite increased nebulizer use. She also presented with trace BLE and wet cough. Due to patient's decreased PO intake around this time, question accurate lasix  administration at home. Labs by home health had leukocytosis reportedly to around 16. She had a lactic acid of 2.1 which improved after 500 cc IVF. She did not have leukocytosis on admission. RSV test still positive. CXR with evidence of left sided pleural effusion similar to when she was discharged on 12/19. proBNP elevated at over 7000. She was started on IV lasix  60 mg daily and ultimately titrated back to home dose of 80 mg daily.  Dysuria  AKI Patient had couple days of dysuria and flank pain with smelling urine per son. Difficult to qualify dysuria with her  indwelling foley, however. UA demonstrated leukocytes. At previous admission she had klebsiella UTI that was resistant to ampicillin; she was continued on penicillin  as below for bacteremia only, making clearance of bacteria possibly less likely. Urine culture grew pseudomonas, susceptible to cefepime . Pt was given Levaquin  x1 on 12/28, ID was consulted and recommended 5-day course of Levaquin  after Foley exchange.  Foley catheter was exchanged 12/29 and started on cefepime  12/30, Total antibiotic course included: Levaquin  (12/28-12/29), cefepime  )12/30 - 1/1)  Patient continued to have Cr increase during admission, suspect 2/2 to overdiueresis. Lasix  was decreased from 80 mg daily to 40 mg daily 12/31 with subsequent stabilization of creatinine. Patient was discharged on home dose of 80 mg daily after her Cr stabilized.   Strep bacteremia 2/2 infected pacemaker  Patient recently was placed on IVPB penicillin  G after last admission for strep bacteremia. This was continued during this admission without issue.  Other chronic conditions were medically managed with home medications and formulary alternatives as necessary (T2DM)  PCP Follow-up Recommendations: Ensure completion of PCN for bacteremia Recommend close cardiologic follow up to ensure correct doses of lasix  are administered Exchange Foley catheter on or by 02/10/2023 Reevaluate BP management, amlodipine  held this hospitalization due to low DBP. Home health to recheck blood pressure.  Recheck Cr as lasix  was reduced     Results/Tests Pending at Time of Discharge:  Unresulted Labs (From admission, onward)    None        Disposition: Home  Discharge Condition: Stable  Discharge Exam:  Vitals:   01/16/24 1209 01/16/24 1602  BP: (!) 144/72 ROLLEN)  156/67  Pulse: 73 71  Resp:  18  Temp:    SpO2: 98% 94%   Physical Exam: General: chronically ill appearing, NAD Cardiovascular: distant heart sounds, difficult to  assess Respiratory: normal work of breathing on 2 L, CTAB, faint crackles at left base but much clearer compared to prior  Extremities: No LE edema   Significant Procedures: None  Significant Labs and Imaging:  No results for input(s): WBC, HGB, HCT, PLT in the last 48 hours. Recent Labs  Lab 01/15/24 0142 01/16/24 0803  NA 133* 137  K 3.8 3.6  CL 96* 100  CO2 25 25  GLUCOSE 160* 150*  BUN 51* 50*  CREATININE 3.88* 3.64*  CALCIUM  9.4 9.6  MG 1.9 2.2    Pertinent Imaging  01/11/2024 DG Chest  1. Trace to small left pleural effusion. 2. Limited evaluation due to overlapping osseous structures and overlying soft tissues.    Discharge Medications:  Allergies as of 01/16/2024       Reactions   Abilify [aripiprazole] Anaphylaxis, Shortness Of Breath, Other (See Comments)   Tremors, also   Keflex  [cephalexin ] Swelling, Other (See Comments)   Tongue swelling 12/2023 tolerated ceftriaxone  with no reported issues   Versacloz  [clozapine ] Anaphylaxis   Ativan  [lorazepam ] Other (See Comments)   Unknown reaction   Benadryl [diphenhydramine Hcl] Other (See Comments)   Vision changes   Benztropine  Other (See Comments)   Mobility issues   Latuda [lurasidone Hcl] Other (See Comments)   Tremors   Remeron [mirtazapine] Other (See Comments)   Tremors   Haldol  [haloperidol  Lactate] Other (See Comments)   Tardive dyskinesias  Tremors Somnolence Speech changes Sialorrhea    Codeine Other (See Comments)   Unknown reaction   Latex Rash        Medication List     PAUSE taking these medications    amLODipine  10 MG tablet Wait to take this until your doctor or other care provider tells you to start again. Commonly known as: NORVASC  Take 1 tablet (10 mg total) by mouth at bedtime.       TAKE these medications    Accu-Chek Guide Test test strip Generic drug: glucose blood Use to check blood glucose once daily and as needed for symptoms   acetaminophen  500 MG  tablet Commonly known as: TYLENOL  Take 500-1,000 mg by mouth every 6 (six) hours as needed for mild pain (pain score 1-3) or headache.   albuterol  108 (90 Base) MCG/ACT inhaler Commonly known as: VENTOLIN  HFA Inhale 2 puffs into the lungs every 6 (six) hours as needed for wheezing or shortness of breath.   allopurinol  100 MG tablet Commonly known as: ZYLOPRIM  Take 0.5 tablets (50 mg total) by mouth in the morning.   apixaban  2.5 MG Tabs tablet Commonly known as: ELIQUIS  Take 1 tablet (2.5 mg total) by mouth 2 (two) times daily.   cholecalciferol  25 MCG (1000 UNIT) tablet Commonly known as: VITAMIN D3 Take 1 tablet (1,000 Units total) by mouth daily.   DESITIN EX Apply 1 Application topically as needed.   diclofenac  Sodium 1 % Gel Commonly known as: Voltaren  Apply 2 g topically 4 (four) times daily as needed (pain).   divalproex  250 MG DR tablet Commonly known as: DEPAKOTE  Take 1 tablet (250 mg total) by mouth every 12 (twelve) hours.   freestyle lancets Use to check blood glucose once daily and as needed for symptoms   furosemide  40 MG tablet Commonly known as: Lasix  Take 1 tablet (40 mg total)  by mouth daily. What changed: how much to take   ipratropium-albuterol  0.5-2.5 (3) MG/3ML Soln Commonly known as: DUONEB Take 3 mLs by nebulization every 4 (four) hours as needed.   levofloxacin  750 MG tablet Commonly known as: Levaquin  Take 1 tablet (750 mg total) by mouth daily for 3 days.   loperamide  2 MG capsule Commonly known as: IMODIUM  Take 1 capsule (2 mg total) by mouth as needed for diarrhea or loose stools.   metoprolol  tartrate 100 MG tablet Commonly known as: LOPRESSOR  Take 1 tablet (100 mg total) by mouth 3 (three) times daily.   Multivitamin Women 50+ Tabs Take 1 tablet by mouth daily with breakfast.   nitroGLYCERIN  0.4 MG SL tablet Commonly known as: NITROSTAT  Place 1 tablet (0.4 mg total) under the tongue every 5 (five) minutes as needed for chest  pain.   nystatin  powder Commonly known as: nystatin  Apply 1 Application topically daily. Apply after bathing. What changed: Another medication with the same name was changed. Make sure you understand how and when to take each.   nystatin  cream Commonly known as: MYCOSTATIN  Apply 1 Application topically daily as needed (Rash). What changed:  when to take this additional instructions   OLANZapine  10 MG tablet Commonly known as: ZYPREXA  Take 15 mg by mouth at bedtime.   OVER THE COUNTER MEDICATION Apply 1 application  topically See admin instructions. Caldesene Medicated Protecting Powder- Apply under the breasts and between the skin folds 2 times a day as needed for irritation   oxyCODONE  5 MG immediate release tablet Commonly known as: Roxicodone  Take 0.5 tablets (2.5 mg total) by mouth every 12 (twelve) hours as needed for severe pain (pain score 7-10).   penicillin  G IVPB Inject 12 Million Units into the vein daily for 17 days. Indication:  Streptococcal mitis/oralis bacteremia First Dose: Yes Last Day of Therapy:  02/01/2024 Labs - Once weekly:  CBC/D and BMP, Labs - Once weekly: ESR and CRP Infuse 12 million units IV daily as a continuous infusion Method of administration: Elastomeric (Continuous infusion) Method of administration may be changed at the discretion of home infusion pharmacist based upon assessment of the patient and/or caregiver's ability to self-administer the medication ordered What changed: additional instructions   polycarbophil 625 MG tablet Commonly known as: FIBERCON Take 1 tablet (625 mg total) by mouth daily. What changed:  when to take this reasons to take this   pravastatin  40 MG tablet Commonly known as: PRAVACHOL  Take 1 tablet (40 mg total) by mouth every evening.   sodium bicarbonate  650 MG tablet Take 1 tablet (650 mg total) by mouth 3 (three) times daily.   Tradjenta  5 MG Tabs tablet Generic drug: linagliptin  Take 1 tablet (5 mg  total) by mouth daily.   Trelegy Ellipta  100-62.5-25 MCG/ACT Aepb Generic drug: Fluticasone -Umeclidin-Vilant Inhale 1 puff into the lungs daily.   triamcinolone  ointment 0.5 % Commonly known as: KENALOG  Apply 1 Application topically 2 (two) times daily as needed (skin irritation).               Discharge Care Instructions  (From admission, onward)           Start     Ordered   01/16/24 0000  Change dressing on IV access line weekly and PRN  (Home infusion instructions - [facility] )        01/16/24 1246            Discharge Instructions: Please refer to Patient Instructions section of EMR  for full details.  Patient was counseled important signs and symptoms that should prompt return to medical care, changes in medications, dietary instructions, activity restrictions, and follow up appointments.   Follow-Up Appointments: Have messaged to coordinate home visit with Dr. Delores (PCP).    Nicholas Bar, MD 01/16/2024, 5:46 PM PGY-3,  Family Medicine  "

## 2024-01-16 NOTE — Assessment & Plan Note (Addendum)
-   Foley exchanged 12/29 - Ucx showed psuedomonas sensitive to cefepime , will continue cefepime  1g q24h for renal adjustment, plan to transition to PO levoquin at discharge for total 5 d course

## 2024-01-16 NOTE — TOC Transition Note (Addendum)
 Transition of Care [facility]) - Discharge Note   Patient Details  Name: Abigail Wallace MRN: [id number] Date of Birth: October 28, 1945  Transition of [facility]) CM/SW Contact:  Corean JAYSON Canary, RN Phone Number: 01/16/2024, 1:46 PM   Clinical Narrative:     Patient will likely go home today. Her son wants transport to bring her around 8p,.  Jon from [facility] aware of patient discharge, RN reordered [facility] for [facility] aware of discharge, they have a antibiotic at home that her son can hook up, Pam will send doeses tomorrow, she has the OPAT orders.  He prefers [employer], will call for availabiliity [facility] has availability with a pickup time at 0730.  Called [name] , the patients son. He  States he only has dose of medicine, that is past the giving date. He did call the pharmacy to state that he could use that dose. Jon from [facility] is aware that the paitent will DC  Nursing aware of time of transport Final next level of care: Home w Home Health Services     Patient Goals and CMS Choice Patient states their goals for this hospitalization and ongoing recovery are:: return home with home health          Discharge Placement                       Discharge Plan and Services Additional resources added to the After Visit Summary for                              [facility] Agency: [facility]        Social Drivers of Health (SDOH) Interventions SDOH Screenings   Food Insecurity: No Food Insecurity (01/12/2024)  Housing: Low Risk (01/12/2024)  Transportation Needs: No Transportation Needs (01/12/2024)  Utilities: Not At Risk (01/12/2024)  Depression (PHQ2-9): High Risk (09/30/2023)  Financial Resource Strain: Low Risk (03/23/2022)  Social Connections: Socially Isolated (01/12/2024)  Tobacco Use: Medium Risk (01/13/2024)     Readmission Risk Interventions    01/02/2024    1:34 PM 12/20/2023    2:55 PM 11/26/2023   11:22 AM  Readmission Risk Prevention  Plan  Transportation Screening Complete Complete Complete  PCP or Specialist Appt within 3-5 Days   Complete  HRI or Home Care Consult   Complete  Social Work Consult for Recovery Care Planning/Counseling   Complete  Palliative Care Screening   Not Applicable  Medication Review [facility]) Complete Complete Referral to Pharmacy  PCP or Specialist appointment within 3-5 days of discharge  Complete   HRI or Home Care Consult Complete Complete   Palliative Care Screening Not Applicable Not Applicable   Skilled Nursing Facility Not Applicable

## 2024-01-16 NOTE — Telephone Encounter (Signed)
 Patient was discharged earlier today. Received page to call back to patient's son Mr. Tennie at  [phone]. Mr. Tennie with questions about Abigail Wallace PCN IV starting time and if her Levaquin  could start tomorrow 01/17/24 as patient do not have the medication with her tonight.   Per chart pt received Cefepime  1 g IV and PCN G IV today while inpatient. Recommending pt's son that it should be ok to start her IV PCN G tmrw morning and PO Levaquin  tomorrow     Houston Samuels, DO  PGY 1 Family Medicine Resident

## 2024-01-17 ENCOUNTER — Telehealth: Payer: Self-pay | Admitting: Family Medicine

## 2024-01-17 ENCOUNTER — Telehealth: Payer: Self-pay

## 2024-01-17 NOTE — Telephone Encounter (Signed)
 RN Team Patient discharged from hospital. Please call [facility] and ask to draw CBC and CMP week of 01/20/23  Thank you Suzann Daring, MD  Family Medicine Teaching Service

## 2024-01-17 NOTE — Telephone Encounter (Signed)
 Called and spoke with patient's son [name] Arenas.  He was initially requesting that Levaquin  be sent to [facility] for pickup.  After reviewing the records it had already been sent to that pharmacy on discharge of the patient yesterday.  Patient's son will pick up the medication today.  He is also concerned that the left?  Side of his mom's central line is not flushing today.  He attempted to drop back and did not get any fluid out and was unable to inject any fluid into it.  He has attempted to reach out to his home health service this through [facility], but they report they are unable to help.  He has left several messages with the supply company and his contact there to try and remedy the situation.  I will speak with my attending today and see if there is anything we can do to help.  Patient's son understands and will continue to reach out to his caseworker Pam at the supply company for assistance.  He will call back or send a message if he has been successful.

## 2024-01-17 NOTE — Transitions of Care (Post Inpatient/ED Visit) (Signed)
 "  01/17/2024  Name: Abigail Wallace MRN: [id number] DOB: 1945/03/21  Today's TOC FU Call Status:    Patient's Name and Date of Birth confirmed. Name, DOB  Transition Care Management Follow-up Telephone Call Date of Discharge: 01/16/24 Discharge Facility: [name]) Type of Discharge: Inpatient Admission Primary Inpatient Discharge Diagnosis:: Acute on chronic systolic heart failure (HCC), RSV still +, Strep Bacteremia. How have you been since you were released from the hospital?:  (Spoke with son, [name], as caretaker and informant for this entire encounter.) Any questions or concerns?: Yes Patient Questions/Concerns:: Son [name] as informant on this call had similar concerns, questions, experience as last discharge follow up call. He stated patient did not received IV antibiotic that was to be delivered to him prior to discharge as he had been administering these prior to admission from prior diagnosis of strep bactermia from an infected pacemaker and was active with [facility] and [name] agency. He has been unable to reach Pam at [facility] or PCP on call person as office is closed today. He also had concerns about ports on IV not being flushed/working properly when he tried to flush last night. He also stated he was sent home with another patient's discharge AVS. While on this call he did recieve a call back from on call PCP/Provider and this call was ended so he could take the call back. RN CM reached out to ICM to see if there were any additional information available on [facility] contact information on on delivery of IV antibiotic. Patient Questions/Concerns Addressed: Other: (See above notes.)  Items Reviewed: Did you receive and understand the discharge instructions provided?: Yes Medications obtained,verified, and reconciled?:  (Call was ended early due to PCP on call provider call back regarding above discharge concerns/IV antibiotics.) Name of  Support/Comfort Primary Source: [name], Emergency Contact  [phone] (Mobile)  Medications Reviewed Today: See narrative below, call was cut short due to call back from PCP on call provider due to IV antibiotic delivery issues and IV concerns.  Medications Reviewed Today   Medications were not reviewed in this encounter    Assessment: Unable to complete: See narrative below, call was cut short due to call back from PCP on call provider due to IV antibiotic delivery issues and IV concerns.   Home Care and Equipment/Supplies: Were Home Health Services Ordered?: Yes Name of Home Health Agency:: [facility], [name] (resumption of care on both agencies confirmed via ICM notes) Has Agency set up a time to come to your home?: No (Resumption of care in progress, Son has contact number.) First Home Health Visit Date:  (Resumption of care in progress, Son has contact number.) Any new equipment or medical supplies ordered?: No  Functional Questionnaire: Do you need assistance with bathing/showering or dressing?: Yes (Patient is mostly bed bound, at home with son [name], 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 Also has [facility] home aide Monday-Friday 7 am-3pm and 3 pm -7 pm. Obtains diapers from [employer] and has home O2/[employer].) Do you need assistance with meal preparation?: Yes Do you need assistance with eating?: Yes Do you have difficulty maintaining continence: No Do you have difficulty managing or taking your medications?: Yes (Son manages medications for patient including IV infusions)  Follow up appointments reviewed: PCP Follow-up appointment confirmed?: No (Patient awaiting call back from on call provider) MD Provider Line [phone] Given: No Do you  need transportation to your follow-up appointment?: No Do you understand care options if your condition(s) worsen?: Yes-patient verbalized understanding (Per  son as informant, reviewed with son.)  01/17/2024: Successful outreach partially completed with son, [name], ([place]) as informant and caretaker of patient for all information on this call. Patient's son, Renay, had similar concerns, questions, experience as last discharge follow up call and documentation.  He stated patient did not received IV antibiotic that was to be delivered to him prior to discharge as he had been administering these prior to admission from prior diagnosis of strep bactermia from an infected pacemaker and was active with [facility] infusion and [name] home [facility] agency.  He has been unable to reach Pam at [facility] or PCP on call person as office is closed today.  He also had concerns about ports on IV not being flushed/working properly when he tried to flush last night. He also stated he was sent home with another patient's discharge AVS. While on this call he did recieve a call back from on call PCP/Provider and this call was ended so he could take the call back. RN CM reached out to ICM to see if there were any additional information available on [facility] contact information on on delivery of IV antibiotic pending any replies or additional information.  Patient has all contact information but reports he had not been able to get in touch with [facility] regarding IV antibiotic or state of IV line issues as [name] indicated they do not provide that level of care to this type of IV.    Call was ended when PCP on call provider was calling patient's son.  Basic information was gathered during discussion of his concerns and discharge instructions for ongoing/resumption of home IV infusion for bacteremia from prior admission and discharge as priority and not all call items were able to be addressed.  Will attempt to call son back to check on status of concerns.   Per note in chart on call provider called son back and addressed similar concerns from patient as on this outreach and indicated  antibiotic was delivered to [facility] for pick up, not delivered to patient prior to discharge as was his understanding and pharmacy closed early yesterday and today.  Provider also indicated they will speak to attending to see if anything else can be done on their end and patient's son is to call PCP on call provider back or send message via MyChart if he has been able to rectify situation/concerns via [facility] or [name] regarding state of IV.   Plan:  Will attempt to call son back to check on status of concerns in a follow up call.  Enrolled patient for now as requesting on follow up call.    Bing Edison MSN, RN RN [employer]  [facility] Office Hours M-F [phone] Direct Dial: [phone] Main Phone [phone]  Fax: [phone] .com    "

## 2024-01-20 ENCOUNTER — Other Ambulatory Visit

## 2024-01-20 ENCOUNTER — Telehealth: Payer: Self-pay

## 2024-01-20 MED ORDER — APIXABAN 2.5 MG PO TABS: 2.5000 mg | ORAL_TABLET | Freq: Two times a day (BID) | ORAL | 3 refills | Status: AC

## 2024-01-20 NOTE — Telephone Encounter (Signed)
 Called [name]. Spoke with [name] and provided verbal orders.   [name], RN

## 2024-01-20 NOTE — Transitions of Care (Post Inpatient/ED Visit) (Signed)
 01/20/2024  Patient ID: [name], female   DOB: 17-May-1945, 79 y.o.   MRN: [id number]  01/20/2024 Follow up call x1 from initial outreach on 01/17/2024.  TOC RN CM contacted patient's, son, [name] to double check on concerns and issues with obtaining IV antibiotic and concerns that one of patient's ports was not flushing/drawing back well.  [name] stated Levaquin  had been sent to patient's pharmacy but pharmacy was closed on day of discharge and he had been unable to contact [employer] for assist.  [name] did state everything had been addressed and patient was doing better and that was what mattered. He also stated he was good to go now and did not need a follow up call going forward.   Bing Edison MSN, RN RN [employer] [facility]  [facility] Office Hours M-F [phone] Direct Dial: [phone] Main Phone [phone]  Fax: [phone] [email or web address]

## 2024-01-20 NOTE — Telephone Encounter (Signed)
 Patient Abigail Wallace on nurse line requesting refill on Eliquis  and to increase her oxycodone  to 1 tablet vs 0.5 tablet. Spoke with patient and then son regarding concerns.   Advised that Eliquis  has been sent to pharmacy.  Son reports that hips and legs are causing a lot of pain. She states that half a tablet is not working.   She is also concerned about having shaking since being discharged. [name] is going to talk with Psychiatry about this as well. He reports that blood sugar levels have been WNL and that she has been doing much better since discharge.    Advised [name] that I would forward message to Dr. Delores regarding concern and if she can increase to 1 whole tablet daily.   *Also called and gave verbal orders to [name] for labs this week.   [name], RN

## 2024-01-21 MED ORDER — OXYCODONE HCL 5 MG PO TABS: 5.0000 mg | ORAL_TABLET | Freq: Two times a day (BID) | ORAL | 0 refills | Status: DC | PRN

## 2024-01-21 NOTE — Telephone Encounter (Signed)
 Called son. Discussed shaking. Possibly due to Duonebs.   RN team  Please ask [name] to also get Magnesium  when they draw labs along with CBC and CMP.   Suzann Daring, MD  Family Medicine Teaching Service

## 2024-01-21 NOTE — Telephone Encounter (Signed)
 Sent in Oxycodone . PDMP reviewed. I send a message [name] about the shaking and a good time to call today  Suzann Daring, MD  [facility] Medicine Teaching Service

## 2024-01-22 NOTE — Telephone Encounter (Signed)
 Son returns call to nurse line to follow up on mychart message.   He reports that he gave her 10 mg amlodipine  last night around 2000. He is also unsure if his BP monitor is giving false highs. He currently has a wrist monitor, however, has ordered an upper arm monitor.   Son reports that it is difficult to determine if patient is having symptoms associated with elevated BP, however, she seems to be at her baseline.   Discussed reasons to seek care.   [name] is not currently at home, however, will check BP and send message with updated BP for today.   [name], RN

## 2024-01-23 ENCOUNTER — Telehealth: Payer: Self-pay | Admitting: Family Medicine

## 2024-01-23 NOTE — Telephone Encounter (Signed)
 Spoke with [name] earlier this morning ~10am.  Gave VO per [employer].  She reports she will notify the nurse of new orders, however she is unsure if the home visit has been completed already.   She reports they are 3 nurses down and reports if the visit has already been completed then there is nothing they can do.   She reports she will let me know if added orders can not be completed.   Of note, I have not heard from [place] as of yet.

## 2024-01-23 NOTE — Telephone Encounter (Signed)
 Please call [name] - Remove and replace Foley Catheter - Please draw urine culture off of NEW catheter  If spasms, pain persist, pain needs to be seen  Suzann Daring, MD  [facility] Medicine Teaching Service

## 2024-01-23 NOTE — Telephone Encounter (Signed)
 Called son. [name] removed the Foley but has been unable to replace. Advised if still having severe pain and spasms, should be seen in ED for advanced imaging given known bacteremia Suzann Daring, MD  [facility] Medicine Teaching Service

## 2024-01-30 ENCOUNTER — Telehealth (INDEPENDENT_AMBULATORY_CARE_PROVIDER_SITE_OTHER): Admitting: Infectious Diseases

## 2024-01-30 ENCOUNTER — Other Ambulatory Visit: Payer: Self-pay | Admitting: Cardiovascular Disease

## 2024-01-30 ENCOUNTER — Other Ambulatory Visit: Payer: Self-pay

## 2024-01-30 ENCOUNTER — Telehealth: Payer: Self-pay

## 2024-01-30 ENCOUNTER — Encounter: Payer: Self-pay | Admitting: Infectious Diseases

## 2024-01-30 DIAGNOSIS — T827XXD Infection and inflammatory reaction due to other cardiac and vascular devices, implants and grafts, subsequent encounter: Secondary | ICD-10-CM

## 2024-01-30 DIAGNOSIS — A491 Streptococcal infection, unspecified site: Secondary | ICD-10-CM | POA: Insufficient documentation

## 2024-01-30 DIAGNOSIS — Z950 Presence of cardiac pacemaker: Secondary | ICD-10-CM

## 2024-01-30 DIAGNOSIS — Z5181 Encounter for therapeutic drug level monitoring: Secondary | ICD-10-CM | POA: Diagnosis not present

## 2024-01-30 DIAGNOSIS — Z452 Encounter for adjustment and management of vascular access device: Secondary | ICD-10-CM | POA: Insufficient documentation

## 2024-01-30 MED ORDER — AMOXICILLIN 500 MG PO CAPS: 500.0000 mg | ORAL_CAPSULE | Freq: Two times a day (BID) | ORAL | 5 refills | Status: AC

## 2024-01-30 NOTE — Telephone Encounter (Signed)
 Per Dr. Dea pt picc line can be pulled after last dose 1/17. Relayed orders to [employer] with [employer]. Lorenda CHRISTELLA Code, RMA

## 2024-01-30 NOTE — Progress Notes (Signed)
 Virtual Visit via Video Note  I connected with Abigail Wallace @ on 01/30/24 at  1:30 PM EST by a video enabled telemedicine application and verified that I am speaking with the correct person using two identifiers.  Location: Patient: Home  Provider: [facility]   I discussed the limitations of evaluation and management by telemedicine and the availability of in person appointments. The patient expressed understanding and agreed to proceed.  [facility]  Patient Active Problem List   Diagnosis Date Noted   Generalized weakness 01/14/2024   Stage 4 chronic kidney disease (HCC) 01/13/2024   Dehydration 01/12/2024   Acute on chronic systolic heart failure (HCC) 01/11/2024   Fatigue 01/11/2024   RSV infection 12/31/2023   Acute hypoxic respiratory failure (HCC) 12/30/2023   Frequent loose stools 12/24/2023   Bacteremia 12/22/2023   Hallucinations 12/22/2023   Subacute bacterial endocarditis 12/22/2023   Infected pacemaker 12/22/2023   Chronic nausea 12/22/2023   Altered mental status 12/20/2023   UTI (urinary tract infection) 12/20/2023   Acute on chronic diastolic heart failure (HCC) 12/20/2023   Chronic kidney disease (CKD), stage 4 (HCC) 12/20/2023   Dizziness 12/20/2023   Lumbar pain 11/25/2023   Indwelling catheter present on admission 11/24/2023   Urinary tract infection associated with indwelling urethral catheter 09/26/2023   Heart failure with preserved ejection fraction (HCC) 08/09/2023   Chronic health problem 06/14/2023   Acute on chronic heart failure (HCC) 06/14/2023   AKI (acute kidney injury) 02/11/2023   T2DM (type 2 diabetes mellitus) (HCC) 02/08/2023   Meningioma (HCC) 04/06/2022   CHF exacerbation (HCC) 02/27/2022   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   Cholesteatoma 12/24/2021   Advanced care planning/counseling discussion 09/29/2021   COPD exacerbation (HCC) 03/15/2021   Chronic back pain 11/14/2018   Pulmonary nodules 03/03/2018    Paroxysmal atrial fibrillation (HCC) s/p cardioversion  01/28/2018   Dependence on wheelchair 05/17/2017   Abdominal aortic ectasia 01/16/2017   Renal cyst 01/16/2017   CKD stage IIIb 09/12/2016   Dyspnea with Chronic COPD 04/15/2015   Well controlled type 2 diabetes mellitus (HCC) 03/20/2012   Gout 09/26/2011   UTI 06/21/2011   Hypertension, isolated systolic 04/26/2011   Tardive dyskinesia 04/26/2011   Artificial cardiac pacemaker 03/30/2010   Vaginal prolapse 03/27/2010   Obstructive sleep apnea 03/27/2010   Osteoarthritis of right knee 03/27/2010   Resting tremor 12/15/2009   Sick sinus syndrome (HCC) 04/24/2006   Schizophrenia (HCC) 03/14/2006   Chronic obstructive pulmonary disease (HCC) 03/14/2006    Patient's Medications  New Prescriptions   No medications on file  Previous Medications   ACETAMINOPHEN  (TYLENOL ) 500 MG TABLET    Take 500-1,000 mg by mouth every 6 (six) hours as needed for mild pain (pain score 1-3) or headache.   ALBUTEROL  (VENTOLIN  HFA) 108 (90 BASE) MCG/ACT INHALER    Inhale 2 puffs into the lungs every 6 (six) hours as needed for wheezing or shortness of breath.   ALLOPURINOL  (ZYLOPRIM ) 100 MG TABLET    Take 0.5 tablets (50 mg total) by mouth in the morning.   AMLODIPINE  (NORVASC ) 10 MG TABLET    Take 1 tablet (10 mg total) by mouth at bedtime.   APIXABAN  (ELIQUIS ) 2.5 MG TABS TABLET    Take 1 tablet (2.5 mg total) by mouth 2 (two) times daily.   CHOLECALCIFEROL  (VITAMIN D3) 25 MCG (1000 UNIT) TABLET    Take 1 tablet (1,000 Units total) by mouth daily.   DICLOFENAC  SODIUM (VOLTAREN ) 1 % GEL  Apply 2 g topically 4 (four) times daily as needed (pain).   DIVALPROEX  (DEPAKOTE ) 250 MG DR TABLET    Take 1 tablet (250 mg total) by mouth every 12 (twelve) hours.   FLUTICASONE -UMECLIDIN-VILANT (TRELEGY ELLIPTA ) 100-62.5-25 MCG/ACT AEPB    Inhale 1 puff into the lungs daily.   FUROSEMIDE  (LASIX ) 40 MG TABLET    Take 1 tablet (40 mg total) by mouth daily.    GLUCOSE BLOOD (ACCU-CHEK GUIDE TEST) TEST STRIP    Use to check blood glucose once daily and as needed for symptoms   IPRATROPIUM-ALBUTEROL  (DUONEB) 0.5-2.5 (3) MG/3ML SOLN    Take 3 mLs by nebulization every 4 (four) hours as needed.   LANCETS (FREESTYLE) LANCETS    Use to check blood glucose once daily and as needed for symptoms   LINAGLIPTIN  (TRADJENTA ) 5 MG TABS TABLET    Take 1 tablet (5 mg total) by mouth daily.   LOPERAMIDE  (IMODIUM ) 2 MG CAPSULE    Take 1 capsule (2 mg total) by mouth as needed for diarrhea or loose stools.   METOPROLOL  TARTRATE (LOPRESSOR ) 100 MG TABLET    Take 1 tablet (100 mg total) by mouth 3 (three) times daily.   MULTIPLE VITAMINS-MINERALS (MULTIVITAMIN WOMEN 50+) TABS    Take 1 tablet by mouth daily with breakfast.   NITROGLYCERIN  (NITROSTAT ) 0.4 MG SL TABLET    Place 1 tablet (0.4 mg total) under the tongue every 5 (five) minutes as needed for chest pain.   NYSTATIN  CREAM (MYCOSTATIN )    Apply 1 Application topically daily as needed (Rash).   NYSTATIN  POWDER    Apply 1 Application topically daily. Apply after bathing.   OLANZAPINE  (ZYPREXA ) 10 MG TABLET    Take 15 mg by mouth at bedtime.   OVER THE COUNTER MEDICATION    Apply 1 application  topically See admin instructions. Caldesene Medicated Protecting Powder- Apply under the breasts and between the skin folds 2 times a day as needed for irritation   OXYCODONE  (ROXICODONE ) 5 MG IMMEDIATE RELEASE TABLET    Take 1 tablet (5 mg total) by mouth every 12 (twelve) hours as needed for severe pain (pain score 7-10).   PENICILLIN  G IVPB    Inject 12 Million Units into the vein daily for 17 days. Indication:  Streptococcal mitis/oralis bacteremia First Dose: Yes Last Day of Therapy:  02/01/2024 Labs - Once weekly:  CBC/D and BMP, Labs - Once weekly: ESR and CRP Infuse 12 million units IV daily as a continuous infusion Method of administration: Elastomeric (Continuous infusion) Method of administration may be changed at the  discretion of home infusion pharmacist based upon assessment of the patient and/or caregiver's ability to self-administer the medication ordered   POLYCARBOPHIL (FIBERCON) 625 MG TABLET    Take 1 tablet (625 mg total) by mouth daily.   PRAVASTATIN  (PRAVACHOL ) 40 MG TABLET    Take 1 tablet (40 mg total) by mouth every evening.   SODIUM BICARBONATE  650 MG TABLET    Take 1 tablet (650 mg total) by mouth 3 (three) times daily.   TRIAMCINOLONE  OINTMENT (KENALOG ) 0.5 %    Apply 1 Application topically 2 (two) times daily as needed (skin irritation).   ZINC  OXIDE (DESITIN EX)    Apply 1 Application topically as needed.  Modified Medications   No medications on file  Discontinued Medications   No medications on file    History of Present Illness: 79 year old female with prior history as below including CHF, COPD, A-fib, s/p PPM  who is here for HFU after recent hospital admission for 12/5-12/12 for complicated extremity/oralis bacteremia with possible infected PPM.  Seen by cardiology, EP and ID and was discharged on 12/12 to complete 6 weeks of IV pen G through 1/18 via PICC with a plan to transition to p.o. antibiotics lifelong.  Admitted 12/15-12/19 for COPD exacerbation plus RSV infection as well as possible CHF exacerbation with AKI Admitted 12/27-1/1 for acute on chronic CHF exacerbation, AKI, possible Pseudomonas UTI.  01/30/24 Video visit as patient is unable to come to clinic in person. Son reports receiving IV antibiotics through PICC without concerns or issues. Denies nausea, vomiting or diarrhea. Son reports oxygen  saturation is better, BP still elevated and PCP Dr Delores managing. No other concerns.   ROS: all systems reviewed with pertinent positives and negatives as listed above   Past Medical History:  Diagnosis Date   Abdominal wall hernia 01/2017   Advanced care planning/counseling discussion 09/29/2021   Atrial fibrillation (HCC)    CHF (congestive heart failure) (HCC)    CKD  (chronic kidney disease)    COPD (chronic obstructive pulmonary disease) (HCC)    Coronary artery disease    NON-CRITICAL   Ectasis aorta    Episode of change in speech 01/12/2024   GERD (gastroesophageal reflux disease)    Gout    History of Sundowning 06/15/2023   Hypertension    Memory difficulties 12/02/2013   Pacemaker    Paranoid schizophrenia (HCC)    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         Tardive dyskinesia 04/26/2011   Type 2 diabetes mellitus [facility])    Past Surgical History:  Procedure Laterality Date   CARDIAC CATHETERIZATION  2007   Non-critical.    CARDIOVERSION N/A 01/27/2021   Procedure: CARDIOVERSION;  Surgeon: Barbaraann Darryle Ned, MD;  Location: [facility] ENDOSCOPY;  Service: Cardiovascular;  Laterality: N/A;   CHOLECYSTECTOMY     ELBOW SURGERY     EP IMPLANTABLE DEVICE N/A 08/31/2014   Procedure: PPM Generator Changeout;  Surgeon: Jerel Balding, MD;  Location: [facility] INVASIVE CV LAB;  Service: Cardiovascular;  Laterality: N/A;   INSERT / REPLACE / REMOVE PACEMAKER  2007   [place]/SYMPTOMATIC HEART BLOCK   IR TUNNELED CENTRAL VENOUS CATH [facility] W IMG  12/26/2023   PACEMAKER PLACEMENT  09/28/2005   2/2 SSS?   Persantine stress test  05/01/2010   EF 66%. Normal LV sys fx. Unchanged from previous studies.    TRANSESOPHAGEAL ECHOCARDIOGRAM (CATH LAB) N/A 12/23/2023   Procedure: TRANSESOPHAGEAL ECHOCARDIOGRAM;  Surgeon: Shlomo Wilbert SAUNDERS, MD;  Location: [facility] INVASIVE CV LAB;  Service: Cardiovascular;  Laterality: N/A;   TRANSTHORACIC ECHOCARDIOGRAM  09/08/10   SEVERE CONCENTRIC HYPERTROPHY.LV FUNCTION WAS VIGOROUS.EF 65%-70%.VENTRICULAR SEPTUM-INCOORDINATE MOTION.LEFT ATRIUM-MILDLY DILATED.TRIVIAL TR.   TUBAL LIGATION     Social History[1]  Family History  Problem Relation Age of Onset   Heart disease Mother    Heart disease Father    Stroke Sister    Diabetes type II Son      Allergies[2]  Health Maintenance  Topic Date Due   Zoster Vaccines- Shingrix (1 of 2) Never done   Bone Density Scan  Never done   DTaP/Tdap/Td (2 - Tdap) 10/15/2012   Medicare Annual Wellness (AWV)  12/27/2021   OPHTHALMOLOGY EXAM  09/29/2022   COVID-19 Vaccine (1 - 2025-26 season) Never done   FOOT EXAM  12/11/2023   Influenza  Vaccine  04/14/2024 (Originally 08/16/2023)   Pneumococcal Vaccine: 50+ Years (2 of 2 - PCV) 08/08/2024 (Originally 08/16/1998)   HEMOGLOBIN A1C  03/26/2024   Diabetic kidney evaluation - Urine ACR  10/13/2024   Diabetic kidney evaluation - eGFR measurement  01/15/2025   Meningococcal B Vaccine  Aged Out   Lung Cancer Screening  Discontinued   Hepatitis C Screening  Discontinued    Observations/Objective: Sitting and able to speak, comfortable, non toxic appearing  Assessment and Plan: # Complicated strep mitis/oralis bacteremia with infected PPM  Plan - Complete IV Penicillin  G through 1/17 - It appears patient has a tunneled catheter and not PICC. Will order IR consult for tunneled line removal after IV course  - Start amoxicillin  p.o. 5 mg p.o. twice daily from 1/16, refills sent, life long  - fu in a month   # Medication Monitoring  - 1/1 cr @3 .64, 12/30 CBC unremarkable  - No recent labs available, will request with home health.   # CHF/A Fib s/p PPM/Type 2 DM - fu with PCP and Cardiology  Follow Up Instructions: 4 weeks to a month    I discussed the assessment and treatment plan with the patient. The patient was provided an opportunity to ask questions and all were answered. The patient agreed with the plan and demonstrated an understanding of the instructions.   The patient was advised to call back or seek an in-person evaluation if the symptoms worsen or if the condition fails to improve as anticipated.  I personally spent a total of 30 minutes in the care of the patient today including preparing to see the patient, getting/reviewing  separately obtained history, performing a medically appropriate exam/evaluation, counseling and educating, placing orders, documenting clinical information in the EHR, independently interpreting results, and communicating results.   Annalee Joseph, MD [facility] for Infectious Disease [facility] [phone] pager   [phone] cell 01/30/2024, 1:34 PM     [1]  Social History Tobacco Use   Smoking status: Former    Current packs/day: 0.00    Types: Cigarettes    Quit date: 02/2022    Years since quitting: 1.9   Smokeless tobacco: Never   Tobacco comments:    1cig a day    07/05/2022 patient quit when going to the hospital 02/2022  Vaping Use   Vaping status: Former  Substance Use Topics   Alcohol  use: No    Alcohol /week: 0.0 standard drinks of alcohol    Drug use: No  [2]  Allergies Allergen Reactions   Abilify [Aripiprazole] Anaphylaxis, Shortness Of Breath and Other (See Comments)    Tremors, also   Keflex  [Cephalexin ] Swelling and Other (See Comments)    Tongue swelling 12/2023 tolerated ceftriaxone  with no reported issues   Versacloz  [Clozapine ] Anaphylaxis   Ativan  [Lorazepam ] Other (See Comments)    Unknown reaction   Benadryl [Diphenhydramine Hcl] Other (See Comments)    Vision changes   Benztropine  Other (See Comments)    Mobility issues   Latuda [Lurasidone Hcl] Other (See Comments)    Tremors   Remeron [Mirtazapine] Other (See Comments)    Tremors   Haldol  [Haloperidol  Lactate] Other (See Comments)    Tardive dyskinesias  Tremors Somnolence Speech changes Sialorrhea    Codeine Other (See Comments)    Unknown reaction   Latex Rash

## 2024-01-31 ENCOUNTER — Other Ambulatory Visit: Payer: Self-pay

## 2024-01-31 ENCOUNTER — Encounter: Payer: Self-pay | Admitting: Infectious Diseases

## 2024-01-31 NOTE — Telephone Encounter (Signed)
 In accordance with refill protocols, please review and address the following requirements before this medication refill can be authorized:  Labs

## 2024-01-31 NOTE — Telephone Encounter (Signed)
 Abigail Wallace calls nurse line this morning in regards to blood pressure.   He reports she resumed taking Amlodipine  ~ 1 week after discharge.   He reports her BP readings have been averaging ~170s/80s. He reports she has been in good spirits and reports all other vitals have neen WNL.   Precautions discussed.   Will forward to PCP.

## 2024-02-06 ENCOUNTER — Emergency Department (HOSPITAL_COMMUNITY)

## 2024-02-06 ENCOUNTER — Other Ambulatory Visit: Payer: Self-pay

## 2024-02-06 ENCOUNTER — Encounter (HOSPITAL_COMMUNITY): Payer: Self-pay

## 2024-02-06 ENCOUNTER — Inpatient Hospital Stay (HOSPITAL_COMMUNITY)
Admission: EM | Admit: 2024-02-06 | Discharge: 2024-02-12 | DRG: 699 | Disposition: A | Discharge status: Home / Self Care | Attending: Family Medicine | Admitting: Family Medicine

## 2024-02-06 DIAGNOSIS — I2510 Atherosclerotic heart disease of native coronary artery without angina pectoris: Secondary | ICD-10-CM | POA: Diagnosis present

## 2024-02-06 DIAGNOSIS — I5032 Chronic diastolic (congestive) heart failure: Secondary | ICD-10-CM | POA: Diagnosis present

## 2024-02-06 DIAGNOSIS — N184 Chronic kidney disease, stage 4 (severe): Secondary | ICD-10-CM | POA: Diagnosis present

## 2024-02-06 DIAGNOSIS — I7143 Infrarenal abdominal aortic aneurysm, without rupture: Secondary | ICD-10-CM | POA: Diagnosis present

## 2024-02-06 DIAGNOSIS — F200 Paranoid schizophrenia: Secondary | ICD-10-CM | POA: Diagnosis present

## 2024-02-06 DIAGNOSIS — Z950 Presence of cardiac pacemaker: Secondary | ICD-10-CM

## 2024-02-06 DIAGNOSIS — Y846 Urinary catheterization as the cause of abnormal reaction of the patient, or of later complication, without mention of misadventure at the time of the procedure: Secondary | ICD-10-CM | POA: Diagnosis present

## 2024-02-06 DIAGNOSIS — E1122 Type 2 diabetes mellitus with diabetic chronic kidney disease: Secondary | ICD-10-CM | POA: Diagnosis present

## 2024-02-06 DIAGNOSIS — I130 Hypertensive heart and chronic kidney disease with heart failure and stage 1 through stage 4 chronic kidney disease, or unspecified chronic kidney disease: Principal | ICD-10-CM | POA: Diagnosis present

## 2024-02-06 DIAGNOSIS — Z833 Family history of diabetes mellitus: Secondary | ICD-10-CM

## 2024-02-06 DIAGNOSIS — Z960 Presence of urogenital implants: Secondary | ICD-10-CM

## 2024-02-06 DIAGNOSIS — Z7984 Long term (current) use of oral hypoglycemic drugs: Secondary | ICD-10-CM

## 2024-02-06 DIAGNOSIS — Z7901 Long term (current) use of anticoagulants: Secondary | ICD-10-CM | POA: Diagnosis not present

## 2024-02-06 DIAGNOSIS — I5043 Acute on chronic combined systolic (congestive) and diastolic (congestive) heart failure: Secondary | ICD-10-CM

## 2024-02-06 DIAGNOSIS — E871 Hypo-osmolality and hyponatremia: Secondary | ICD-10-CM | POA: Diagnosis not present

## 2024-02-06 DIAGNOSIS — N1832 Chronic kidney disease, stage 3b: Secondary | ICD-10-CM

## 2024-02-06 DIAGNOSIS — Z1152 Encounter for screening for COVID-19: Secondary | ICD-10-CM | POA: Diagnosis not present

## 2024-02-06 DIAGNOSIS — N179 Acute kidney failure, unspecified: Secondary | ICD-10-CM | POA: Diagnosis present

## 2024-02-06 DIAGNOSIS — Z7401 Bed confinement status: Secondary | ICD-10-CM | POA: Diagnosis not present

## 2024-02-06 DIAGNOSIS — T83518A Infection and inflammatory reaction due to other urinary catheter, initial encounter: Secondary | ICD-10-CM | POA: Diagnosis present

## 2024-02-06 DIAGNOSIS — N390 Urinary tract infection, site not specified: Secondary | ICD-10-CM | POA: Diagnosis present

## 2024-02-06 DIAGNOSIS — Z8249 Family history of ischemic heart disease and other diseases of the circulatory system: Secondary | ICD-10-CM

## 2024-02-06 DIAGNOSIS — I5033 Acute on chronic diastolic (congestive) heart failure: Secondary | ICD-10-CM | POA: Diagnosis present

## 2024-02-06 DIAGNOSIS — J449 Chronic obstructive pulmonary disease, unspecified: Secondary | ICD-10-CM | POA: Diagnosis present

## 2024-02-06 DIAGNOSIS — E785 Hyperlipidemia, unspecified: Secondary | ICD-10-CM | POA: Diagnosis present

## 2024-02-06 DIAGNOSIS — E119 Type 2 diabetes mellitus without complications: Secondary | ICD-10-CM

## 2024-02-06 DIAGNOSIS — K219 Gastro-esophageal reflux disease without esophagitis: Secondary | ICD-10-CM | POA: Diagnosis present

## 2024-02-06 DIAGNOSIS — G8929 Other chronic pain: Secondary | ICD-10-CM | POA: Diagnosis present

## 2024-02-06 DIAGNOSIS — D631 Anemia in chronic kidney disease: Secondary | ICD-10-CM | POA: Diagnosis present

## 2024-02-06 DIAGNOSIS — I959 Hypotension, unspecified: Secondary | ICD-10-CM | POA: Diagnosis not present

## 2024-02-06 DIAGNOSIS — R030 Elevated blood-pressure reading, without diagnosis of hypertension: Secondary | ICD-10-CM | POA: Insufficient documentation

## 2024-02-06 DIAGNOSIS — Z79899 Other long term (current) drug therapy: Secondary | ICD-10-CM

## 2024-02-06 DIAGNOSIS — J411 Mucopurulent chronic bronchitis: Secondary | ICD-10-CM

## 2024-02-06 DIAGNOSIS — N3000 Acute cystitis without hematuria: Secondary | ICD-10-CM | POA: Diagnosis not present

## 2024-02-06 DIAGNOSIS — M109 Gout, unspecified: Secondary | ICD-10-CM | POA: Diagnosis present

## 2024-02-06 DIAGNOSIS — Z792 Long term (current) use of antibiotics: Secondary | ICD-10-CM

## 2024-02-06 DIAGNOSIS — I5023 Acute on chronic systolic (congestive) heart failure: Secondary | ICD-10-CM

## 2024-02-06 DIAGNOSIS — Z885 Allergy status to narcotic agent status: Secondary | ICD-10-CM

## 2024-02-06 DIAGNOSIS — I509 Heart failure, unspecified: Secondary | ICD-10-CM | POA: Diagnosis present

## 2024-02-06 DIAGNOSIS — Z888 Allergy status to other drugs, medicaments and biological substances status: Secondary | ICD-10-CM

## 2024-02-06 DIAGNOSIS — Z91040 Latex allergy status: Secondary | ICD-10-CM | POA: Diagnosis not present

## 2024-02-06 DIAGNOSIS — Z823 Family history of stroke: Secondary | ICD-10-CM

## 2024-02-06 DIAGNOSIS — Z789 Other specified health status: Secondary | ICD-10-CM

## 2024-02-06 DIAGNOSIS — G4733 Obstructive sleep apnea (adult) (pediatric): Secondary | ICD-10-CM | POA: Diagnosis present

## 2024-02-06 LAB — URINALYSIS, W/ REFLEX TO CULTURE (INFECTION SUSPECTED)
Bilirubin Urine: NEGATIVE
Glucose, UA: >=500 mg/dL — AB
Hgb urine dipstick: NEGATIVE
Ketones, ur: NEGATIVE mg/dL
Nitrite: NEGATIVE
Protein, ur: 100 mg/dL — AB
Specific Gravity, Urine: 1.01 (ref 1.005–1.030)
WBC, UA: >50 WBC/hpf (ref 0–5)
pH: 6 (ref 5.0–8.0)

## 2024-02-06 LAB — I-STAT VENOUS BLOOD GAS, ED
Acid-base deficit: 6 mmol/L — ABNORMAL HIGH (ref 0.0–2.0)
Bicarbonate: 19.1 mmol/L — ABNORMAL LOW (ref 20.0–28.0)
Calcium, Ion: 1.46 mmol/L — ABNORMAL HIGH (ref 1.15–1.40)
HCT: 31 % — ABNORMAL LOW (ref 36.0–46.0)
Hemoglobin: 10.5 g/dL — ABNORMAL LOW (ref 12.0–15.0)
O2 Saturation: 92 %
Potassium: 3.7 mmol/L (ref 3.5–5.1)
Sodium: 138 mmol/L (ref 135–145)
TCO2: 20 mmol/L — ABNORMAL LOW (ref 22–32)
pCO2, Ven: 35.2 mmHg — ABNORMAL LOW (ref 44–60)
pH, Ven: 7.342 (ref 7.25–7.43)
pO2, Ven: 67 mmHg — ABNORMAL HIGH (ref 32–45)

## 2024-02-06 LAB — TROPONIN T, HIGH SENSITIVITY
Troponin T High Sensitivity: 34 ng/L — ABNORMAL HIGH (ref 0–19)
Troponin T High Sensitivity: 35 ng/L — ABNORMAL HIGH (ref 0–19)

## 2024-02-06 LAB — COMPREHENSIVE METABOLIC PANEL WITH GFR
ALT: 7 U/L (ref 0–44)
AST: 21 U/L (ref 15–41)
Albumin: 3.2 g/dL — ABNORMAL LOW (ref 3.5–5.0)
Alkaline Phosphatase: 57 U/L (ref 38–126)
Anion gap: 15 (ref 5–15)
BUN: 26 mg/dL — ABNORMAL HIGH (ref 8–23)
CO2: 19 mmol/L — ABNORMAL LOW (ref 22–32)
Calcium: 10.8 mg/dL — ABNORMAL HIGH (ref 8.9–10.3)
Chloride: 103 mmol/L (ref 98–111)
Creatinine, Ser: 2.53 mg/dL — ABNORMAL HIGH (ref 0.44–1.00)
GFR, Estimated: 19 mL/min — ABNORMAL LOW
Glucose, Bld: 154 mg/dL — ABNORMAL HIGH (ref 70–99)
Potassium: 3.8 mmol/L (ref 3.5–5.1)
Sodium: 137 mmol/L (ref 135–145)
Total Bilirubin: 0.2 mg/dL (ref 0.0–1.2)
Total Protein: 6.5 g/dL (ref 6.5–8.1)

## 2024-02-06 LAB — I-STAT CHEM 8, ED
BUN: 26 mg/dL — ABNORMAL HIGH (ref 8–23)
Calcium, Ion: 1.47 mmol/L — ABNORMAL HIGH (ref 1.15–1.40)
Chloride: 107 mmol/L (ref 98–111)
Creatinine, Ser: 2.8 mg/dL — ABNORMAL HIGH (ref 0.44–1.00)
Glucose, Bld: 150 mg/dL — ABNORMAL HIGH (ref 70–99)
HCT: 30 % — ABNORMAL LOW (ref 36.0–46.0)
Hemoglobin: 10.2 g/dL — ABNORMAL LOW (ref 12.0–15.0)
Potassium: 3.7 mmol/L (ref 3.5–5.1)
Sodium: 138 mmol/L (ref 135–145)
TCO2: 19 mmol/L — ABNORMAL LOW (ref 22–32)

## 2024-02-06 LAB — RESP PANEL BY RT-PCR (RSV, FLU A&B, COVID)  RVPGX2
Influenza A by PCR: NEGATIVE
Influenza B by PCR: NEGATIVE
Resp Syncytial Virus by PCR: NEGATIVE
SARS Coronavirus 2 by RT PCR: NEGATIVE

## 2024-02-06 LAB — PROTIME-INR
INR: 1.1 (ref 0.8–1.2)
Prothrombin Time: 15.1 s (ref 11.4–15.2)

## 2024-02-06 LAB — CBC WITH DIFFERENTIAL/PLATELET
Abs Immature Granulocytes: 0.29 K/uL — ABNORMAL HIGH (ref 0.00–0.07)
Basophils Absolute: 0.1 K/uL (ref 0.0–0.1)
Basophils Relative: 1 %
Eosinophils Absolute: 0.3 K/uL (ref 0.0–0.5)
Eosinophils Relative: 3 %
HCT: 32.3 % — ABNORMAL LOW (ref 36.0–46.0)
Hemoglobin: 10.1 g/dL — ABNORMAL LOW (ref 12.0–15.0)
Immature Granulocytes: 3 %
Lymphocytes Relative: 26 %
Lymphs Abs: 2.7 K/uL (ref 0.7–4.0)
MCH: 30.8 pg (ref 26.0–34.0)
MCHC: 31.3 g/dL (ref 30.0–36.0)
MCV: 98.5 fL (ref 80.0–100.0)
Monocytes Absolute: 0.9 K/uL (ref 0.1–1.0)
Monocytes Relative: 8 %
Neutro Abs: 6.2 K/uL (ref 1.7–7.7)
Neutrophils Relative %: 59 %
Platelets: 179 K/uL (ref 150–400)
RBC: 3.28 MIL/uL — ABNORMAL LOW (ref 3.87–5.11)
RDW: 17.3 % — ABNORMAL HIGH (ref 11.5–15.5)
WBC: 10.4 K/uL (ref 4.0–10.5)
nRBC: 0 % (ref 0.0–0.2)

## 2024-02-06 LAB — I-STAT CG4 LACTIC ACID, ED
Lactic Acid, Venous: 1.9 mmol/L (ref 0.5–1.9)
Lactic Acid, Venous: 2 mmol/L (ref 0.5–1.9)

## 2024-02-06 LAB — PRO BRAIN NATRIURETIC PEPTIDE: Pro Brain Natriuretic Peptide: 8279 pg/mL — ABNORMAL HIGH

## 2024-02-06 LAB — MAGNESIUM: Magnesium: 2 mg/dL (ref 1.7–2.4)

## 2024-02-06 MED ORDER — FUROSEMIDE 10 MG/ML IJ SOLN
40.0000 mg | Freq: Once | INTRAMUSCULAR | Status: AC
  Administered 2024-02-06: 40 mg via INTRAVENOUS
  Filled 2024-02-06: qty 4

## 2024-02-06 MED ORDER — ADULT MULTIVITAMIN W/MINERALS CH
1.0000 | ORAL_TABLET | Freq: Every day | ORAL | Status: DC
  Administered 2024-02-06 – 2024-02-12 (×7): 1 via ORAL
  Filled 2024-02-06 (×7): qty 1

## 2024-02-06 MED ORDER — OLANZAPINE 5 MG PO TABS
15.0000 mg | ORAL_TABLET | Freq: Every day | ORAL | Status: DC
  Administered 2024-02-06 – 2024-02-11 (×6): 15 mg via ORAL
  Filled 2024-02-06 (×8): qty 1

## 2024-02-06 MED ORDER — SODIUM BICARBONATE 650 MG PO TABS
650.0000 mg | ORAL_TABLET | Freq: Three times a day (TID) | ORAL | Status: DC
  Administered 2024-02-06 – 2024-02-12 (×20): 650 mg via ORAL
  Filled 2024-02-06 (×20): qty 1

## 2024-02-06 MED ORDER — IPRATROPIUM-ALBUTEROL 0.5-2.5 (3) MG/3ML IN SOLN: 3.0000 mL | RESPIRATORY_TRACT | Status: DC | PRN

## 2024-02-06 MED ORDER — APIXABAN 2.5 MG PO TABS
2.5000 mg | ORAL_TABLET | Freq: Two times a day (BID) | ORAL | Status: DC
  Administered 2024-02-06 – 2024-02-12 (×13): 2.5 mg via ORAL
  Filled 2024-02-06 (×13): qty 1

## 2024-02-06 MED ORDER — ACETAMINOPHEN 325 MG PO TABS
650.0000 mg | ORAL_TABLET | Freq: Four times a day (QID) | ORAL | Status: DC | PRN
  Administered 2024-02-07 – 2024-02-12 (×5): 650 mg via ORAL
  Filled 2024-02-06 (×5): qty 2

## 2024-02-06 MED ORDER — NITROGLYCERIN 0.4 MG SL SUBL: 0.4000 mg | SUBLINGUAL_TABLET | SUBLINGUAL | Status: DC | PRN

## 2024-02-06 MED ORDER — OXYCODONE HCL 5 MG PO TABS
5.0000 mg | ORAL_TABLET | Freq: Once | ORAL | Status: AC
  Administered 2024-02-06: 5 mg via ORAL
  Filled 2024-02-06: qty 1

## 2024-02-06 MED ORDER — DIVALPROEX SODIUM 250 MG PO DR TAB
250.0000 mg | DELAYED_RELEASE_TABLET | Freq: Two times a day (BID) | ORAL | Status: DC
  Administered 2024-02-06 – 2024-02-12 (×13): 250 mg via ORAL
  Filled 2024-02-06 (×13): qty 1

## 2024-02-06 MED ORDER — SODIUM CHLORIDE 0.9 % IV SOLN
1.0000 g | INTRAVENOUS | Status: DC
  Administered 2024-02-06 – 2024-02-08 (×3): 1 g via INTRAVENOUS
  Filled 2024-02-06 (×3): qty 10

## 2024-02-06 MED ORDER — AMOXICILLIN 500 MG PO CAPS
500.0000 mg | ORAL_CAPSULE | Freq: Two times a day (BID) | ORAL | Status: DC
  Administered 2024-02-06 – 2024-02-12 (×13): 500 mg via ORAL
  Filled 2024-02-06 (×13): qty 1

## 2024-02-06 MED ORDER — MORPHINE SULFATE (PF) 4 MG/ML IV SOLN
4.0000 mg | Freq: Once | INTRAVENOUS | Status: AC
  Administered 2024-02-06: 4 mg via INTRAVENOUS
  Filled 2024-02-06: qty 1

## 2024-02-06 MED ORDER — PRAVASTATIN SODIUM 40 MG PO TABS
40.0000 mg | ORAL_TABLET | Freq: Every evening | ORAL | Status: DC
  Administered 2024-02-06 – 2024-02-12 (×6): 40 mg via ORAL
  Filled 2024-02-06 (×7): qty 1

## 2024-02-06 MED ORDER — ONDANSETRON HCL 4 MG/2ML IJ SOLN
4.0000 mg | Freq: Once | INTRAMUSCULAR | Status: AC
  Administered 2024-02-06: 4 mg via INTRAVENOUS
  Filled 2024-02-06: qty 2

## 2024-02-06 MED ORDER — SODIUM CHLORIDE 0.9 % IV SOLN
1.0000 g | Freq: Once | INTRAVENOUS | Status: AC
  Administered 2024-02-06: 1 g via INTRAVENOUS
  Filled 2024-02-06: qty 10

## 2024-02-06 MED ORDER — CHLORHEXIDINE GLUCONATE CLOTH 2 % EX PADS
6.0000 | MEDICATED_PAD | Freq: Every day | CUTANEOUS | Status: DC
  Administered 2024-02-06 – 2024-02-12 (×7): 6 via TOPICAL

## 2024-02-06 NOTE — Progress Notes (Signed)
 Patient in 10/10 pain, notified Dr. Jerrie with Family Medicine Team as no PRNs ordered.  New orders received.     02/06/24 1753  Pain Assessment  Pain Scale 0-10  Pain Score 10  Pain Location Generalized  Pain Intervention(s) MD notified (Comment) (Family Medicine Dr. Jerrie)  Provider Notification  Provider Name/Title Dr. Pinkie Family Medicine Resident MD  Date Provider Notified 02/06/24  Time Provider Notified 1753  Method of Notification  (Secure Chat)  Notification Reason Requested by patient/family  Provider response Evaluate remotely;See new orders  Date of Provider Response 02/06/24  Time of Provider Response 1754

## 2024-02-06 NOTE — Progress Notes (Signed)
 Admitted from Emergency Department to [facility] 03.  Placed on cardiac monitor and vitals obtained.  CHG bath and foley care/pericare performed    02/06/24 1522  Vitals  Temp 98.1 F (36.7 C)  Temp Source Oral  BP (!) 125/52  MAP (mmHg) 71  BP Location Left Wrist  BP Method Automatic  Patient Position (if appropriate) Lying  Pulse Rate 75  Pulse Rate Source Monitor  ECG Heart Rate 75  Resp 18  Level of Consciousness  Level of Consciousness Alert  MEWS COLOR  MEWS Score Color Green  Oxygen  Therapy  SpO2 98 %  O2 Device Nasal Cannula  O2 Flow Rate (L/min) 2 L/min  Pain Assessment  Pain Scale 0-10  Pain Score 3  Pain Location Generalized  Height and Weight  Height 5' 6 (1.676 m)  Weight (!) 143.7 kg  Type of Scale Used Bed  BSA (Calculated - sq m) 2.59 sq meters  BMI (Calculated) 51.16  Weight in (lb) to have BMI = 25 154.6  ECG Monitoring  Telemetry Box Number [id number]  Tele Box Verification Completed by Second Verifier Completed Lanie NT)  MEWS Score  MEWS Temp 0  MEWS Systolic 0  MEWS Pulse 0  MEWS RR 0  MEWS LOC 0  MEWS Score 0

## 2024-02-06 NOTE — ED Triage Notes (Addendum)
 Pt BIB [facility] from home c/o generalized pain. Pt had catheter on right chest removed recently. Pt does have foley placed but family states she has had decreased urinary output. Pt was complaining of SOB en route and was placed on 2L with no further complaints.  BP 150/100 79 HR 97% 2LNC CBG 175

## 2024-02-06 NOTE — Hospital Course (Addendum)
 Abigail Wallace [place] is a 79 y.o.female with a history of T2DM, strep bacteremia s/p infected PPM, gout, A-fib, schizophrenia, HLD, CKD4, COPD, HTN, HFpEF who was admitted to the [facility] Medicine Teaching Service at [facility] for CHF exacerbation. Her hospital course is detailed below:  CHF Exacerbation Initially presented with worsening SOB and chest pain.  Troponin normal.  BNP 8279 on admission and patient required 2 L of oxygen . Patient diuresed well with Lasix  but it was held shortly due to elevated creatinine. Started back once Cr stabilized***. Patient uses oxygen  supplement for comfort at baseline and she continued to do so throughout this admission.  UTI: Low grade fever on admission with reportedly leaking chronic indwelling foley, decreased urinary output and dysuria.  UA showed large leukocytes and rare bacteria but given patient's symptoms and history of Klebsiella and previous cultures, she was started on cefepime .  Urine cultures returned with yeast.  Blood cultures negative >48hrs so antibiotics were discontinued given lack of evidence of UTI.  AKI Patient presented with creatinine 2.8 which was improved from previous admission earlier in the month.  Creatinine did increase to 3.27 for which Lasix  was held.  It was at patient's baseline at the time of discharge***.  Other chronic conditions were medically managed with home medications and formulary alternatives as necessary (T2DM, strep bacteremia s/p infected PPM, gout, A-fib, schizophrenia, HLD)  PCP Follow-up Recommendations: CT chest in 3 years f/u infrarenal abdominal aortic aneurysm  Left lower lobe pulmonary nodule - Rec annual screening with low dose CT Chest

## 2024-02-06 NOTE — ED Notes (Signed)
 Pts bottom cleaned

## 2024-02-06 NOTE — Assessment & Plan Note (Addendum)
 Long term foley s/p urinary retention s/s hx of polio. Replaced foley in ED on admission 02/06/24 and UA was sent  Received [email or web address]  1 g once in ED. Pt w/ pelvic pain, UA show + Leukocyte - Nitrite  Lactice acid 2.0 - Pending Urine Cx - Pending Blood Cx  - Daily CBC

## 2024-02-06 NOTE — ED Provider Notes (Signed)
 " [facility] Provider Note   CSN: [id number] Arrival date & time: 02/06/24  0116     Patient presents with: No chief complaint on file.   Abigail Wallace is a 79 y.o. female.   Patient comes to the emergency department for evaluation of multiple problems.  Patient has been experiencing generalized weakness and generalized pain.  She reports that she has pain in the middle of her back that goes across the back as well as abdominal pain.  She has been experiencing chest pain and shortness of breath.  Patient comes to the ED by ambulance, was placed on oxygen .  Oxygen  saturation is 97% on 2 L nasal cannula at arrival to the ED.  Patient reports that she is on fluid restrictions, has had decreased oral intake today and decreased urinary output.  She has a chronic indwelling Foley catheter.  Patient reports that she thinks something is wrong with the catheter because she has a lot of pain in her privates.       Prior to Admission medications  Medication Sig Start Date End Date Taking? Authorizing Provider  acetaminophen  (TYLENOL ) 500 MG tablet Take 500-1,000 mg by mouth every 6 (six) hours as needed for mild pain (pain score 1-3) or headache.    [provider]  albuterol  (VENTOLIN  HFA) 108 (90 Base) MCG/ACT inhaler Inhale 2 puffs into the lungs every 6 (six) hours as needed for wheezing or shortness of breath. 11/11/23   Delores Suzann HERO, MD  allopurinol  (ZYLOPRIM ) 100 MG tablet Take 0.5 tablets (50 mg total) by mouth in the morning. 01/16/24   Nicholas Bar, MD  [Paused] amLODipine  (NORVASC ) 10 MG tablet Take 1 tablet (10 mg total) by mouth at bedtime. Wait to take this until your doctor or other care provider tells you to start again. 10/11/23   Delores Suzann HERO, MD  amoxicillin  (AMOXIL ) 500 MG capsule Take 1 capsule (500 mg total) by mouth in the morning and at bedtime. 01/30/24   Manandhar, Sabina, MD  apixaban  (ELIQUIS ) 2.5 MG TABS  tablet Take 1 tablet (2.5 mg total) by mouth 2 (two) times daily. 01/20/24   Delores Suzann HERO, MD  cholecalciferol  (VITAMIN D3) 25 MCG (1000 UNIT) tablet Take 1 tablet (1,000 Units total) by mouth daily. 08/12/23   Rumball, Alison M, DO  diclofenac  Sodium (VOLTAREN ) 1 % GEL Apply 2 g topically 4 (four) times daily as needed (pain). 08/19/23   Delores Suzann HERO, MD  divalproex  (DEPAKOTE ) 250 MG DR tablet Take 1 tablet (250 mg total) by mouth every 12 (twelve) hours. 03/05/22   Samtani, Jai-Gurmukh, MD  Fluticasone -Umeclidin-Vilant (TRELEGY ELLIPTA ) 100-62.5-25 MCG/ACT AEPB Inhale 1 puff into the lungs daily. 11/11/23   Delores Suzann HERO, MD  furosemide  (LASIX ) 40 MG tablet Take 1 tablet (40 mg total) by mouth daily. 01/16/24 01/15/25  Nicholas Bar, MD  glucose blood (ACCU-CHEK GUIDE TEST) test strip Use to check blood glucose once daily and as needed for symptoms 11/08/23   Delores Suzann HERO, MD  ipratropium-albuterol  (DUONEB) 0.5-2.5 (3) MG/3ML SOLN Take 3 mLs by nebulization every 4 (four) hours as needed. 11/11/23   Delores Suzann HERO, MD  Lancets (FREESTYLE) lancets Use to check blood glucose once daily and as needed for symptoms 11/08/23   Delores Suzann HERO, MD  linagliptin  (TRADJENTA ) 5 MG TABS tablet Take 1 tablet (5 mg total) by mouth daily. 12/27/23   Baloch, Mahnoor, MD  loperamide  (IMODIUM ) 2 MG capsule Take  1 capsule (2 mg total) by mouth as needed for diarrhea or loose stools. 12/27/23   Baloch, Mahnoor, MD  metoprolol  tartrate (LOPRESSOR ) 100 MG tablet Take 1 tablet (100 mg total) by mouth 3 (three) times daily. 09/04/23   Delores Suzann HERO, MD  Multiple Vitamins-Minerals (MULTIVITAMIN WOMEN 50+) TABS Take 1 tablet by mouth daily with breakfast.    [provider]  nitroGLYCERIN  (NITROSTAT ) 0.4 MG SL tablet Place 1 tablet (0.4 mg total) under the tongue every 5 (five) minutes as needed for chest pain. 08/03/22 01/30/24  Croitoru, Mihai, MD  nystatin  cream (MYCOSTATIN ) Apply 1 Application topically daily  as needed (Rash). Patient taking differently: Apply 1 Application topically See admin instructions. Apply as directed to irritated areas of the right thigh and stomach in the morning and at bedtime 11/21/23   Delores Suzann HERO, MD  nystatin  powder Apply 1 Application topically daily. Apply after bathing. 10/04/23   Delores Suzann HERO, MD  OLANZapine  (ZYPREXA ) 10 MG tablet Take 15 mg by mouth at bedtime. 05/17/23   [provider]  OVER THE COUNTER MEDICATION Apply 1 application  topically See admin instructions. Caldesene Medicated Protecting Powder- Apply under the breasts and between the skin folds 2 times a day as needed for irritation    [provider]  oxyCODONE  (ROXICODONE ) 5 MG immediate release tablet Take 1 tablet (5 mg total) by mouth every 12 (twelve) hours as needed for severe pain (pain score 7-10). 01/21/24   Delores Suzann HERO, MD  polycarbophil (FIBERCON) 625 MG tablet Take 1 tablet (625 mg total) by mouth daily. Patient taking differently: Take 625 mg by mouth as needed for diarrhea or loose stools or mild constipation. 12/27/23   Baloch, Mahnoor, MD  pravastatin  (PRAVACHOL ) 40 MG tablet TAKE 1 TABLET (40 MG TOTAL) BY MOUTH EVERY EVENING. 01/31/24   Croitoru, Mihai, MD  sodium bicarbonate  650 MG tablet Take 1 tablet (650 mg total) by mouth 3 (three) times daily. 12/05/23   Delores Suzann HERO, MD  triamcinolone  ointment (KENALOG ) 0.5 % Apply 1 Application topically 2 (two) times daily as needed (skin irritation). 10/04/23   Delores Suzann HERO, MD  Zinc  Oxide (DESITIN EX) Apply 1 Application topically as needed.    [provider]    Allergies: Abilify [aripiprazole], Keflex  [cephalexin ], Versacloz  [clozapine ], Ativan  [lorazepam ], Benadryl [diphenhydramine hcl], Benztropine , Latuda [lurasidone hcl], Remeron [mirtazapine], Haldol  [haloperidol  lactate], Codeine, and Latex    Review of Systems  Updated Vital Signs BP 125/78   Pulse (!) 58   Temp 98.3 F (36.8 C) (Oral)   Resp  20   Ht 5' 6 (1.676 m)   Wt (!) 152.1 kg   SpO2 100%   BMI 54.12 kg/m   Physical Exam Vitals and nursing note reviewed.  Constitutional:      General: She is not in acute distress.    Appearance: She is well-developed. She is obese.  HENT:     Head: Normocephalic and atraumatic.     Mouth/Throat:     Mouth: Mucous membranes are moist.  Eyes:     General: Vision grossly intact. Gaze aligned appropriately.     Extraocular Movements: Extraocular movements intact.     Conjunctiva/sclera: Conjunctivae normal.  Cardiovascular:     Rate and Rhythm: Normal rate and regular rhythm.     Pulses: Normal pulses.     Heart sounds: Normal heart sounds, S1 normal and S2 normal. No murmur heard.    No friction rub. No gallop.  Comments: Trace edema bilateral lower extremities Pulmonary:     Effort: Pulmonary effort is normal. No respiratory distress.     Breath sounds: Normal breath sounds.  Abdominal:     General: Bowel sounds are normal.     Palpations: Abdomen is soft.     Tenderness: There is generalized abdominal tenderness. There is no guarding or rebound.     Hernia: No hernia is present.  Musculoskeletal:        General: No swelling.     Cervical back: Full passive range of motion without pain, normal range of motion and neck supple. No spinous process tenderness or muscular tenderness. Normal range of motion.     Right lower leg: No edema.     Left lower leg: No edema.  Skin:    General: Skin is warm and dry.     Capillary Refill: Capillary refill takes less than 2 seconds.     Findings: No ecchymosis, erythema, rash or wound.  Neurological:     General: No focal deficit present.     Mental Status: She is alert and oriented to person, place, and time.     GCS: GCS eye subscore is 4. GCS verbal subscore is 5. GCS motor subscore is 6.     Cranial Nerves: Cranial nerves 2-12 are intact.     Sensory: Sensation is intact.     Motor: Motor function is intact.     Coordination:  Coordination is intact.  Psychiatric:        Attention and Perception: Attention normal.        Mood and Affect: Mood normal.        Speech: Speech normal.        Behavior: Behavior normal.     (all labs ordered are listed, but only abnormal results are displayed) Labs Reviewed  CBC WITH DIFFERENTIAL/PLATELET - Abnormal; Notable for the following components:      Result Value   RBC 3.28 (*)    Hemoglobin 10.1 (*)    HCT 32.3 (*)    RDW 17.3 (*)    Abs Immature Granulocytes 0.29 (*)    All other components within normal limits  COMPREHENSIVE METABOLIC PANEL WITH GFR - Abnormal; Notable for the following components:   CO2 19 (*)    Glucose, Bld 154 (*)    BUN 26 (*)    Creatinine, Ser 2.53 (*)    Calcium  10.8 (*)    Albumin 3.2 (*)    GFR, Estimated 19 (*)    All other components within normal limits  PRO BRAIN NATRIURETIC PEPTIDE - Abnormal; Notable for the following components:   Pro Brain Natriuretic Peptide 8,279.0 (*)    All other components within normal limits  URINALYSIS, W/ REFLEX TO CULTURE (INFECTION SUSPECTED) - Abnormal; Notable for the following components:   APPearance HAZY (*)    Glucose, UA >=500 (*)    Protein, ur 100 (*)    Leukocytes,Ua LARGE (*)    Bacteria, UA RARE (*)    All other components within normal limits  I-STAT CHEM 8, ED - Abnormal; Notable for the following components:   BUN 26 (*)    Creatinine, Ser 2.80 (*)    Glucose, Bld 150 (*)    Calcium , Ion 1.47 (*)    TCO2 19 (*)    Hemoglobin 10.2 (*)    HCT 30.0 (*)    All other components within normal limits  I-STAT VENOUS BLOOD GAS, ED - Abnormal; Notable for the  following components:   pCO2, Ven 35.2 (*)    pO2, Ven 67 (*)    Bicarbonate 19.1 (*)    TCO2 20 (*)    Acid-base deficit 6.0 (*)    Calcium , Ion 1.46 (*)    HCT 31.0 (*)    Hemoglobin 10.5 (*)    All other components within normal limits  TROPONIN T, HIGH SENSITIVITY - Abnormal; Notable for the following components:    Troponin T High Sensitivity 34 (*)    All other components within normal limits  RESP PANEL BY RT-PCR (RSV, FLU A&B, COVID)  RVPGX2  CULTURE, BLOOD (ROUTINE X 2)  CULTURE, BLOOD (ROUTINE X 2)  URINE CULTURE  MAGNESIUM   PROTIME-INR  I-STAT CG4 LACTIC ACID, ED  I-STAT CG4 LACTIC ACID, ED  TROPONIN T, HIGH SENSITIVITY    EKG: None  Radiology: CT CHEST ABDOMEN PELVIS WO CONTRAST Result Date: 02/06/2024 EXAM: CT CHEST, ABDOMEN AND PELVIS WITHOUT CONTRAST 02/06/2024 04:06:43 AM TECHNIQUE: CT of the chest, abdomen and pelvis was performed without the administration of intravenous contrast. Multiplanar reformatted images are provided for review. Automated exposure control, iterative reconstruction, and/or weight based adjustment of the mA/kV was utilized to reduce the radiation dose to as low as reasonably achievable. COMPARISON: CT chest, abdomen, and pelvis 10/28/2023. CLINICAL HISTORY: 79 year old female with sepsis. FINDINGS: CHEST: MEDIASTINUM AND LYMPH NODES: Left chest port a cath redemonstrated. Extensive calcified aorta and coronary artery atherosclerosis in the chest. Stable cardiomegaly. No pericardial effusion. Vascular patency not evaluated in the absence of IV contrast. The central airways are clear. No mediastinal, hilar or axillary lymphadenopathy. LUNGS AND PLEURA: Pulmonary chronic upper lobe predominant paraseptal cystic or emphysematous changes in the lungs. These appear stable along with apical scarring since last month. Dependent atelectasis in the left lung (the patient was imaged in left lateral decubitus). Round chronic pulmonary nodule in the superior segment of the left lower lobe is 10 mm, stable from last year, and chronic present since at least 2023 low dose staging CT and see follow up recommendations of that exam postpren. No acute lung finding. No pleural effusion. No pneumothorax. ABDOMEN AND PELVIS: LIVER: Unremarkable. GALLBLADDER AND BILE DUCTS: Chronic cholecystectomy.  No biliary ductal dilatation. SPLEEN: No acute abnormality. PANCREAS: No acute abnormality. ADRENAL GLANDS: No acute abnormality. KIDNEYS, URETERS AND BLADDER: Chronic polycystic renal disease. Non-contrast kidneys, ureters, appear stable. The bladder is decompressed by a foley catheter. No stones in the kidneys or ureters. No hydronephrosis. No perinephric or periureteral stranding. GI AND BOWEL: Not all of the ventral abdomen and abdominal panniculus are visible, and there is a broad based chronic ventral abdominal hernia containing bowel and mesentery which is incompletely visible (series 2 image 103). The greater curve of the stomach and gastric omentum are also herniated. The intraabdominal bowel loops are non dilated. Diverticulosis of the visible large bowel is present but no active bowel inflammation is identified. There is no bowel obstruction. REPRODUCTIVE ORGANS: No acute abnormality. PERITONEUM AND RETROPERITONEUM: No ascites. No free air. VASCULATURE: Advanced calcified atherosclerosis in the abdomen and pelvis. Chronic infrarenal abdominal aortic aneurysm measures 38 mm in diameter and is stable (series 2 image 82). For an abdominal aortic aneurysm measuring 3.0-3.9 cm, annual follow-up imaging is recommended. ABDOMINAL AND PELVIS LYMPH NODES: No lymphadenopathy. BONES AND SOFT TISSUES: Thoracic osseous structures appear stable. Occasional mild chronic thoracic vertebral compression fractures. No acute osseous abnormality in the chest. Generally mild for age lumbar spine degeneration. Very severe right hip joint degeneration with bulky  subchondral cysts, osteophytosis, and regional degenerative sclerosis. Pelvis and proximal femurs appear stable since last year. No acute osseous abnormality. IMPRESSION: 1. No acute or inflammatory process identified in the non-contrast chest, abdomen, or pelvis. 2. Stable infrarenal abdominal aortic aneurysm (3.8 cm); recommend ultrasound follow-up in 3 years. Aortic  Aneurysm NOS (ICD10-I71.9). 3. Left lower lobe pulmonary nodule (10 mm) is chronic. Annual screening with low dose CT Chest is recommended. 4. Aortic Atherosclerosis (ICD10-I70.0).  Emphysema (ICD10-J43.9). Electronically signed by: Helayne Hurst MD 02/06/2024 04:23 AM EST RP Workstation: [id number]   DG Chest Port 1 View Result Date: 02/06/2024 EXAM: 1 VIEW(S) XRAY OF THE CHEST 02/06/2024 02:19:00 AM COMPARISON: 01/11/2024 CLINICAL HISTORY: chest pain, shortnes of breath FINDINGS: LIMITATIONS/ARTIFACTS: Patient is rotated. Left lower hemithorax is obscured by overlying soft tissues and poorly evaluated. LINES, TUBES AND DEVICES: Left subclavian pacemaker. Prior right IJ catheter has been removed. LUNGS AND PLEURA: No focal pulmonary opacity. No pleural effusion. No pneumothorax. HEART AND MEDIASTINUM: No acute abnormality of the cardiac and mediastinal silhouettes. BONES AND SOFT TISSUES: No acute osseous abnormality. IMPRESSION: 1. Limited evaluation of the left lower hemithorax. 2. Within that constraint, no acute cardiopulmonary abnormality. Electronically signed by: Pinkie Pebbles MD 02/06/2024 02:34 AM EST RP Workstation: [id number]     Procedures   Medications Ordered in the ED  cefTRIAXone  (ROCEPHIN ) 1 g in sodium chloride  0.9 % 100 mL IVPB (1 g Intravenous New Bag/Given 02/06/24 0436)  morphine  (PF) 4 MG/ML injection 4 mg (4 mg Intravenous Given 02/06/24 0429)  ondansetron  (ZOFRAN ) injection 4 mg (4 mg Intravenous Given 02/06/24 0429)                                    Medical Decision Making Amount and/or Complexity of Data Reviewed Labs: ordered. Decision-making details documented in ED Course. Radiology: ordered and independent interpretation performed. Decision-making details documented in ED Course.  Risk Prescription drug management.   Differential Diagnosis considered includes, but not limited to: CHF exacerbation; ACS/MI; Bronchitis/URI; Pneumonia; PE  Presents to the  emergency department with multiple problems.  Patient has a history of congestive heart failure, arrives with chest pain and shortness of breath.  She is on supplemental oxygen  on arrival for comfort.  Reviewing her records reveals recent hospitalization for congestive heart failure, had an acute kidney injury during that stay and her Lasix  dose has been reduced.  Patient does appear to be volume overloaded on arrival here today.  Patient with multiple other complaints including abdominal pain, back pain.  Urinalysis suggestive of infection.  Interpretation is somewhat difficult in this patient as she has a chronic Foley and likely has some colonization, specifically her most recent culture of Pseudomonas is probably colonization.  The urinalysis obtained for that urine was not particularly abnormal.  Patient does have greater than 50 white cells in the urine which is a change from prior urines.  Similar urines in the past grew Klebsiella which likely is a urinary pathogen.  Patient administered Rocephin , recultured.  CT chest abdomen and pelvis performed.  No pneumonia noted.  No inflammatory, infectious or obstructive process in the abdomen.  CRITICAL CARE Performed by: Lonni JINNY Seats   Total critical care time: 30 minutes  Critical care time was exclusive of separately billable procedures and treating other patients.  Critical care was necessary to treat or prevent imminent or life-threatening deterioration.  Critical care was time spent personally by me  on the following activities: development of treatment plan with patient and/or surrogate as well as nursing, discussions with consultants, evaluation of patient's response to treatment, examination of patient, obtaining history from patient or surrogate, ordering and performing treatments and interventions, ordering and review of laboratory studies, ordering and review of radiographic studies, pulse oximetry and re-evaluation of patient's  condition.      Final diagnoses:  Acute on chronic congestive heart failure, unspecified heart failure type [facility])  Urinary tract infection without hematuria, site unspecified    ED Discharge Orders     None          Haze Lonni PARAS, MD 02/06/24 [phone]  "

## 2024-02-06 NOTE — Assessment & Plan Note (Addendum)
 TEE on 12/22/23 shows LVEF 50-55%, Lasix  40 mg IV was given in ED BNP [id number] on admission which she received 40 mg of IV Lasix  in ED - Admit to FMTS, attending Dr. Rumball - Vital signs per floor - Pain control: Tylenol  PRN - Strict I&O - Daily weight - Bowel Reg: MiraLax , Senna - Antiemesis : Zofran  PRN - AM Labs: CBC, BMP, Mg - Fall precautions - PT/OT consult

## 2024-02-06 NOTE — ED Notes (Signed)
 HOS. BED. ORD

## 2024-02-06 NOTE — Plan of Care (Signed)
 Admitted from Emergency Department.  Medicated for pain, see MAR for details.  Son at bedside.   Problem: Education: Goal: Knowledge of General Education information will improve Description: Including pain rating scale, medication(s)/side effects and non-pharmacologic comfort measures Outcome: Progressing   Problem: Health Behavior/Discharge Planning: Goal: Ability to manage health-related needs will improve Outcome: Progressing   Problem: Pain Managment: Goal: General experience of comfort will improve and/or be controlled Outcome: Progressing   Problem: Safety: Goal: Ability to remain free from injury will improve Outcome: Progressing   Problem: Skin Integrity: Goal: Risk for impaired skin integrity will decrease Outcome: Progressing

## 2024-02-06 NOTE — H&P (Addendum)
 "    Hospital Admission History and Physical Service Pager: [phone]  Patient name: Abigail [facility] Medical record number: [id number] Date of Birth: 1945-03-14 Age: 79 y.o. Gender: female  Primary Care Provider: Delores Suzann HERO, MD  Consultants: None Code Status: Full Code which was confirmed with patient and her son, [name]    Preferred Emergency Contact:  Contact Information     Name Relation Home Work [place] Son [phone]  [phone]      Other Contacts     Name Relation Home Work Mobile   [name],[name] Son [phone]  [phone]        Chief Complaint: worsening SOB   Differential and Medical Decision Making:  Ghislaine Harcum [facility] is a 79 y.o. female with PMH of SSS s/p PPM on long term ABX for infected pacemaker , A-fib on home eliquis , gout, CKD Stage4, Schizophrenia, COPD, OSA, HTN, Tardive Dyskinesia, meningioma, HFpEF, Chronic foley, and DMII presenting with worsening SOB, chest pain, and pelvic pain    Differential for this patient's presentation of this includes  CHF exacerbation : Most likely. Pt w/ worsening SOB BNP 8279 on admission. Lasix  was given in ED improved her symptoms and was able to wean her off the oxygen . Rule out MI from Trop WNL   Flu : Less likely. Negative Resp Panel on admission   Pneumonia : Could be consider but less likely d/t her normal WBC on admission. On RA after diuresis, and no acute finding CTCAP  COPD exacerbation : less likely but should consider in differential. However (-) wheezing on exam, Pt only use oxygen  supplement for comfort. On RM w/ 100% Sat in ED after Lasix  given  Assessment & Plan CHF exacerbation (HCC) TEE on 12/22/23 shows LVEF 50-55%, Lasix  40 mg IV was given in ED BNP 8279 on admission which she received 40 mg of IV Lasix  in ED - Admit to FMTS, attending Dr. Rumball - Vital signs per floor - Pain control: Tylenol  PRN - Strict I&O - Daily weight - Bowel Reg: MiraLax , Senna -  Antiemesis : Zofran  PRN - AM Labs: CBC, BMP, Mg - Fall precautions - PT/OT consult Abdominal pain, UTI Long term foley s/p urinary retention s/s hx of polio. Replaced foley in ED on admission 02/06/24 and UA was sent  Received [email or web address]  1 g once in ED. Pt w/ pelvic pain, UA show + Leukocyte - Nitrite  Lactice acid 2.0 - Pending Urine Cx - Pending Blood Cx  - Daily CBC AKI (acute kidney injury) BUN 26 sCr 2.8 which improved from the day of last admission discharge BUN 50 sCr 3.64 - Daily BMP - Strict I&O - Avoid nephrotoxic agent  Chronic health problem DMII : CBG Q4H, Sensitive SSI  Strep Bacteremia s/p infected PPM : Previously on IV PCN-G ( Stop on 1/17) - transition to Amoxicillin  5 mg PO BID for life long starting 01/31/24 per ID note on 01/30/24 Gout - Continue home Allopurinol   A-fib - Continue home Eliquis . Continue Cardiac monitoring  Schizophrenia : Continue home Depakote  and Olanzapine  HLD : Continue Provastatin   FEN/GI: Carb modified heart healthy diet  VTE Prophylaxis: Eliquis   Disposition: Progressive   History of Present Illness:  Bryndle Corredor is a 79 y.o. female with history of  PMH of SSS s/p PPM on long term ABX for infected pacemaker , A-fib on home eliquis , gout, CKD Stage4, Schizophrenia, COPD, OSA, HTN, Tardive Dyskinesia, meningioma, HFpEF, Chronic foley, and DMII presenting with worsening  SOB, chest pain, worsening hypertension, and low grade fever. Per her son,[name] her foley has also been leaking. She was recently admitted on FMTS service on 01/11/24 - 01/16/24 for fatigue, cough, weakness found to have volume overloaded  In the ED, she is hemodynamic stable Pertinent lab include BG 150 BUN 50 sCr 3.64 GFR 12. Lactic acid 1.9. CTCAP shows No acute finding  w/ stable infrarenal abdominal aortic aneurysm(3.8) w/ LLL pulmonary nodule  Review Of Systems: Per HPI  Pertinent Past Medical History: A-fib on Eliquis  CHF CKD3b COPD CAD GERD Gout HTNSSS  s/p PPM Paranoid schizophrenia DMII Remainder reviewed in history tab.   Pertinent Past Surgical History: None contributing  Remainder reviewed in history tab.   Pertinent Social History: Tobacco use: Denies Alcohol  use: Denies Other Substance use: Denies Lives with Son, [name]   Pertinent Family History: None contributing   Objective: BP 125/78   Pulse (!) 58   Temp 98.3 F (36.8 C) (Oral)   Resp 20   Ht 5' 6 (1.676 m)   Wt (!) 152.1 kg   SpO2 100%   BMI 54.12 kg/m   Physical Exam Constitutional:      Appearance: Normal appearance.  Cardiovascular:     Rate and Rhythm: Normal rate and regular rhythm.     Pulses: Normal pulses.  Pulmonary:     Effort: Pulmonary effort is normal.     Breath sounds: Examination of the right-lower field reveals decreased breath sounds. Examination of the left-lower field reveals decreased breath sounds. Decreased breath sounds present.  Abdominal:     Palpations: Abdomen is soft.     Comments: Last BM 02/04/24  Neurological:     Mental Status: She is alert and oriented to person, place, and time. Mental status is at baseline.  Psychiatric:        Mood and Affect: Mood normal.        Behavior: Behavior normal.      Labs:  CBC BMET  Recent Labs  Lab 02/06/24 0154 02/06/24 0211  WBC 10.4  --   HGB 10.1* 10.2*  10.5*  HCT 32.3* 30.0*  31.0*  PLT 179  --    Recent Labs  Lab 02/06/24 0154 02/06/24 0211  NA 137 138  138  K 3.8 3.7  3.7  CL 103 107  CO2 19*  --   BUN 26* 26*  CREATININE 2.53* 2.80*  GLUCOSE 154* 150*  CALCIUM  10.8*  --      EKG:    Vent. rate 60 BPM PR interval * ms QRS duration 192 ms QT/QTcB 470/470 ms P-R-T axes * -70 148   Imaging Studies Performed: EXAM: 1 VIEW(S) XRAY OF THE CHEST 02/06/2024 02:19:00 AM  IMPRESSION: 1. Limited evaluation of the left lower hemithorax. 2. Within that constraint, no acute cardiopulmonary abnormality.  EXAM: CT CHEST, ABDOMEN AND PELVIS WITHOUT  CONTRAST 02/06/2024 04:06:43 AM  IMPRESSION: 1. No acute or inflammatory process identified in the non-contrast chest, abdomen, or pelvis. 2. Stable infrarenal abdominal aortic aneurysm (3.8 cm); recommend ultrasound follow-up in 3 years. Aortic Aneurysm NOS (ICD10-I71.9). 3. Left lower lobe pulmonary nodule (10 mm) is chronic. Annual screening with low dose CT Chest is recommended. 4. Aortic Atherosclerosis (ICD10-I70.0).  Emphysema (ICD10-J43.9).   Suzen Houston NOVAK, DO 02/06/2024, 5:10 AM PGY-1, [facility] Family Medicine  FPTS Intern pager: [phone], text pages welcome Secure chat group [facility] Teaching Service     "

## 2024-02-06 NOTE — Plan of Care (Signed)
 FMTS Interim Progress Note  S: Resting in bed. States breathing is improved but reports continued abdominal pain and dysuria.  O: BP 116/89   Pulse 60   Temp 97.8 F (36.6 C) (Oral)   Resp 18   Ht 5' 6 (1.676 m)   Wt (!) 152.1 kg   SpO2 100%   BMI 54.12 kg/m    General: Alert, in NAD.  Cardiovascular: RRR, no m/r/g appreciated though difficult to auscultate 2/2 body habitus Pulmonary: Normal WOB. Decreased breath sounds in lower lung bases Abdomen: Soft, mildly tender, non-distended. Extremities: 1+ pitting edema in LE. Neurologic: No focal deficits  A/P: CHF exacerbation 1000 mL output s/p IV Lasix  40mg . Patient still appears volume overloaded on exam.  - Giving additional IV Lasix  40mg  - Strict I&O, daily weights - Fall precautions - PT/OT consult - AM BMP, Mg, CBC  UTI Patient c/o of abdominal pain and dysuria. S/p CTX 1g x1. Afebrile and without leukocytosis but given she is symptomatic and with hx of foley, appropriate to continue abx. Has previously grown pseudomonas. - IV Cefepime  1g q24hrs - F/u Ucx, Bcx - Patient did have a central line but this was recently removed so likely not a source of infxn; removal confirmed on recent CXR  AKI Cr 2.53 > 2.8 (baseline ~2.5-3). - Daily BMP - Strict I&O - Avoid nephrotoxic agent   On chornic Amoxicillin  500mg  q12hrs life long for strep bacteremia s/p infected PPM    Abigail Gathers, DO 02/06/2024, 8:32 AM PGY-1, [facility] Family Medicine Service pager [phone]

## 2024-02-06 NOTE — ED Notes (Signed)
Floor notified.

## 2024-02-06 NOTE — Assessment & Plan Note (Addendum)
 BUN 26 sCr 2.8 which improved from the day of last admission discharge BUN 50 sCr 3.64 - Daily BMP - Strict I&O - Avoid nephrotoxic agent

## 2024-02-06 NOTE — Progress Notes (Signed)
Heart Failure Navigator Progress Note  Assessed for Heart & Vascular TOC clinic readiness.  Patient does not meet criteria due to PMH of dementia.   Navigator available for reassessment of patient.   Sharen Hones, PharmD, BCPS Heart Failure Stewardship Pharmacist Phone [phone]

## 2024-02-06 NOTE — Assessment & Plan Note (Addendum)
 DMII : CBG Q4H, Sensitive SSI  Strep Bacteremia s/p infected PPM : Previously on IV PCN-G ( Stop on 1/17) - transition to Amoxicillin  5 mg PO BID for life long starting 01/31/24 per ID note on 01/30/24 Gout - Continue home Allopurinol   A-fib - Continue home Eliquis . Continue Cardiac monitoring  Schizophrenia : Continue home Depakote  and Olanzapine  HLD : Continue Provastatin

## 2024-02-07 DIAGNOSIS — I5043 Acute on chronic combined systolic (congestive) and diastolic (congestive) heart failure: Secondary | ICD-10-CM | POA: Diagnosis not present

## 2024-02-07 DIAGNOSIS — Z960 Presence of urogenital implants: Secondary | ICD-10-CM | POA: Diagnosis not present

## 2024-02-07 DIAGNOSIS — I509 Heart failure, unspecified: Secondary | ICD-10-CM | POA: Diagnosis not present

## 2024-02-07 DIAGNOSIS — N3000 Acute cystitis without hematuria: Secondary | ICD-10-CM | POA: Diagnosis not present

## 2024-02-07 DIAGNOSIS — N179 Acute kidney failure, unspecified: Secondary | ICD-10-CM | POA: Diagnosis not present

## 2024-02-07 LAB — BASIC METABOLIC PANEL WITH GFR
Anion gap: 15 (ref 5–15)
BUN: 29 mg/dL — ABNORMAL HIGH (ref 8–23)
CO2: 17 mmol/L — ABNORMAL LOW (ref 22–32)
Calcium: 10.2 mg/dL (ref 8.9–10.3)
Chloride: 101 mmol/L (ref 98–111)
Creatinine, Ser: 3.27 mg/dL — ABNORMAL HIGH (ref 0.44–1.00)
GFR, Estimated: 14 mL/min — ABNORMAL LOW
Glucose, Bld: 153 mg/dL — ABNORMAL HIGH (ref 70–99)
Potassium: 4.4 mmol/L (ref 3.5–5.1)
Sodium: 133 mmol/L — ABNORMAL LOW (ref 135–145)

## 2024-02-07 LAB — CBC
HCT: 33 % — ABNORMAL LOW (ref 36.0–46.0)
Hemoglobin: 9.9 g/dL — ABNORMAL LOW (ref 12.0–15.0)
MCH: 30.1 pg (ref 26.0–34.0)
MCHC: 30 g/dL (ref 30.0–36.0)
MCV: 100.3 fL — ABNORMAL HIGH (ref 80.0–100.0)
Platelets: 175 K/uL (ref 150–400)
RBC: 3.29 MIL/uL — ABNORMAL LOW (ref 3.87–5.11)
RDW: 17.6 % — ABNORMAL HIGH (ref 11.5–15.5)
WBC: 9.9 K/uL (ref 4.0–10.5)
nRBC: 0 % (ref 0.0–0.2)

## 2024-02-07 LAB — MAGNESIUM: Magnesium: 2 mg/dL (ref 1.7–2.4)

## 2024-02-07 LAB — URINE CULTURE: Culture: 20000 — AB

## 2024-02-07 MED ORDER — OXYCODONE HCL 5 MG PO TABS
5.0000 mg | ORAL_TABLET | Freq: Two times a day (BID) | ORAL | Status: DC | PRN
  Administered 2024-02-07 – 2024-02-11 (×3): 5 mg via ORAL
  Filled 2024-02-07 (×6): qty 1

## 2024-02-07 MED ORDER — ALLOPURINOL 100 MG PO TABS
50.0000 mg | ORAL_TABLET | Freq: Every morning | ORAL | Status: DC
  Administered 2024-02-08 – 2024-02-12 (×5): 50 mg via ORAL
  Filled 2024-02-07 (×5): qty 1

## 2024-02-07 MED ORDER — LINAGLIPTIN 5 MG PO TABS
5.0000 mg | ORAL_TABLET | Freq: Every day | ORAL | Status: DC
  Administered 2024-02-07 – 2024-02-12 (×6): 5 mg via ORAL
  Filled 2024-02-07 (×8): qty 1

## 2024-02-07 MED ORDER — BUDESON-GLYCOPYRROL-FORMOTEROL 160-9-4.8 MCG/ACT IN AERO
2.0000 | INHALATION_SPRAY | Freq: Two times a day (BID) | RESPIRATORY_TRACT | Status: DC
  Administered 2024-02-07 – 2024-02-12 (×9): 2 via RESPIRATORY_TRACT
  Filled 2024-02-07: qty 5.9

## 2024-02-07 MED ORDER — NYSTATIN 100000 UNIT/GM EX CREA: 1.0000 | TOPICAL_CREAM | Freq: Every day | CUTANEOUS | Status: DC | PRN

## 2024-02-07 MED ORDER — NYSTATIN 100000 UNIT/GM EX POWD: 1.0000 | Freq: Every day | CUTANEOUS | Status: DC

## 2024-02-07 NOTE — Progress Notes (Signed)
 FMTS Brief Note In to see patient. She is sleeping, but easily awakened. Knows where she is but slightly more confused than usual. Reports she did not sleep well.  Called son with Dr. Jerrie to update him. His in line at [facility], will try again later.  Suzann Daring, MD  Family Medicine Teaching Service

## 2024-02-07 NOTE — Progress Notes (Signed)
 "    Daily Progress Note Intern Pager: [phone]  Patient name: Abigail Wallace Medical record number: [id number] Date of birth: 08/08/1945 Age: 79 y.o. Gender: female  Primary Care Provider: Delores Suzann HERO, MD Consultants: None Code Status: Full code  Pt Overview and Major Events to Date:  1/22 - Admitted  Assessment and Plan: Abigail Wallace is a 79 y.o.female with PMHx T2DM, strep bacteremia s/p infected PPM, gout, A-fib, schizophrenia, HLD admitted for CHF exacerbation. Diuresing well, holding Lasix  given AKI. Treating for UTI. Assessment & Plan CHF exacerbation (HCC) Diuresing well with total output -982.9 mL, weight down 7kg since admission.  - Holding Lasix  today given worsening kidney function - Strict I&Os, daily weight - Fall precautions - PT/OT consult - AM BMP, Mg Abdominal pain, UTI Afebrile, no leukocytosis. Ucx with 20,000 colonies yeast. Possible this is a contaminant given patient has home nystatin  for PRN use though Ucx collected after foley replaced.  - F/u final Bcx (NG @ 1 day) - Reordered home Nystatin  cream and powder AKI (acute kidney injury) Cr 2.8 > 3.27. Possible this is 2/2 Lasix  x2 yesterday.  - Holding Lasix  pending improvement in Cr - AM BMP - Avoid nephrotoxic agents Chronic health problem DMII : Continue home Linagliptin  5mg  daily, CBG Q4H, Sensitive SSI  Strep Bacteremia s/p infected PPM : Previously on IV PCN-G ( Stop on 1/17) - transition to Amoxicillin  5 mg PO BID for life long starting 01/31/24 per ID note on 01/30/24 Gout - Continue home Allopurinol  50mg  daily A-fib - Continue home Eliquis  2.5 mg BID. Continue Cardiac monitoring  Schizophrenia : Continue home Depakote  250 mg BID and Olanzapine  15 mg daily HLD : Continue home Pravastatin  40 mg daily  Chronic back pain: Restarted home oxycodone  5mg  q12hrs PRN   FEN/GI: Carb modified PPx: Eliquis  Dispo:Pending PT recommendations  pending clinical improvement    Subjective:  Patient resting in bed this morning.  Reports back pain but unable to tell me if Tylenol  or one-time oxycodone  dose from yesterday is helping.  She does use oxycodone  at home for chronic pain. No other concerns at this time.  Objective: Temp:  [98 F (36.7 C)-98.6 F (37 C)] 98.6 F (37 C) (01/23 0337) Pulse Rate:  [59-75] 59 (01/23 0337) Resp:  [17-22] 22 (01/23 0337) BP: (106-138)/(49-64) 106/55 (01/23 0337) SpO2:  [93 %-100 %] 93 % (01/23 0337) Weight:  [143.7 kg-145.2 kg] 145.2 kg (01/23 0337)  Physical Exam: General: Laying on side, NAD Cardiovascular: RRR, no m/r/g appreciated. Exam limited 2/2 body habitus Pulmonary: Normal WOB. Mild crackles in lower lung exams though exam limited 2/2 body habitus Extremities: Warm, 1+ pitting edema in LE, R > L   Laboratory: Most recent CBC Lab Results  Component Value Date   WBC 9.9 02/07/2024   HGB 9.9 (L) 02/07/2024   HCT 33.0 (L) 02/07/2024   MCV 100.3 (H) 02/07/2024   PLT 175 02/07/2024   Most recent BMP    Latest Ref Rng & Units 02/07/2024    3:04 AM  BMP  Glucose 70 - 99 mg/dL 846   BUN 8 - 23 mg/dL 29   Creatinine 9.55 - 1.00 mg/dL 6.72   Sodium 864 - 854 mmol/L 133   Potassium 3.5 - 5.1 mmol/L 4.4   Chloride 98 - 111 mmol/L 101   CO2 22 - 32 mmol/L 17   Calcium  8.9 - 10.3 mg/dL 89.7     Jerrie Gathers, DO 02/07/2024, 7:06 AM  PGY-1,  [facility] FPTS Intern pager: [phone], text pages welcome Secure chat group [facility] Teaching Service   "

## 2024-02-07 NOTE — Plan of Care (Signed)

## 2024-02-07 NOTE — Assessment & Plan Note (Addendum)
 DMII : Continue home Linagliptin  5mg  daily, CBG Q4H, Sensitive SSI  Strep Bacteremia s/p infected PPM : Previously on IV PCN-G ( Stop on 1/17) - transition to Amoxicillin  5 mg PO BID for life long starting 01/31/24 per ID note on 01/30/24 Gout - Continue home Allopurinol  50mg  daily A-fib - Continue home Eliquis  2.5 mg BID. Continue Cardiac monitoring  Schizophrenia : Continue home Depakote  250 mg BID and Olanzapine  15 mg daily HLD : Continue home Pravastatin  40 mg daily  Chronic back pain: Restarted home oxycodone  5mg  q12hrs PRN

## 2024-02-07 NOTE — Plan of Care (Signed)
   Problem: Health Behavior/Discharge Planning: Goal: Ability to manage health-related needs will improve Outcome: Progressing

## 2024-02-07 NOTE — Assessment & Plan Note (Addendum)
 Afebrile, no leukocytosis. Ucx with 20,000 colonies yeast. Possible this is a contaminant given patient has home nystatin  for PRN use though Ucx collected after foley replaced.  - F/u final Bcx (NG @ 1 day) - Reordered home Nystatin  cream and powder

## 2024-02-07 NOTE — Assessment & Plan Note (Addendum)
 Diuresing well with total output -982.9 mL, weight down 7kg since admission.  - Holding Lasix  today given worsening kidney function - Strict I&Os, daily weight - Fall precautions - PT/OT consult - AM BMP, Mg

## 2024-02-07 NOTE — Assessment & Plan Note (Addendum)
 Cr 2.8 > 3.27. Possible this is 2/2 Lasix  x2 yesterday.  - Holding Lasix  pending improvement in Cr - AM BMP - Avoid nephrotoxic agents

## 2024-02-07 NOTE — Progress Notes (Signed)
 FMTS Brief Note   Called and updated son. All questions answered.    Suzann Daring, MD  Family Medicine Teaching Service

## 2024-02-07 NOTE — Plan of Care (Signed)
?  Problem: Clinical Measurements: ?Goal: Diagnostic test results will improve ?Outcome: Progressing ?  ?Problem: Coping: ?Goal: Level of anxiety will decrease ?Outcome: Progressing ?  ?Problem: Elimination: ?Goal: Will not experience complications related to urinary retention ?Outcome: Progressing ?  ?

## 2024-02-07 NOTE — TOC Initial Note (Signed)
 Transition of Care [facility]) - Initial/Assessment Note    Patient Details  Name: Abigail Wallace MRN: [id number] Date of Birth: Sep 05, 1945  [facility]) CM/SW Contact:    Luise JAYSON Pan, LCSWA Phone Number: 02/07/2024, 10:49 AM  Clinical Narrative:  Patient is from home with son, [name], and has a PCP. She has HH hx with [name] for having an HHRN for IV infusion and receives an aide with [facility] Mon - Fri from 7a- 3 pm and 3pm - 7 pm. Patient has hx of home oxygen  with [employer] and she gets diapers through [employer] as well. Patient has previously discharged home via ambulance with [facility] and family support system.   Inpatient care management (ICM) will continue to monitor and follow.       Expected Discharge Plan: Home w Home Health Services Barriers to Discharge: Continued Medical Work up   Patient Goals and CMS Choice Patient states their goals for this hospitalization and ongoing recovery are:: return home CMS Medicare.gov Compare Post Acute Care list provided to:: Patient Choice offered to / list presented to : Patient [place] ownership interest in [facility].provided to:: Patient    Expected Discharge Plan and Services   Discharge Planning Services: CM Consult Post Acute Care Choice: Home Health Living arrangements for the past 2 months: Single Family Home                                      Prior Living Arrangements/Services Living arrangements for the past 2 months: Single Family Home Lives with:: Self, Adult Children Patient language and need for interpreter reviewed:: Yes Do you feel safe going back to the place where you live?: Yes      Need for Family Participation in Patient Care: Yes (Comment) Care giver support system in place?: Yes (comment)   Criminal Activity/Legal Involvement Pertinent to Current Situation/Hospitalization: No - Comment as needed  Activities of Daily Living   ADL Screening (condition at time of  admission) Independently performs ADLs?: No Does the patient have a NEW difficulty with bathing/dressing/toileting/self-feeding that is expected to last >3 days?: No Does the patient have a NEW difficulty with getting in/out of bed, walking, or climbing stairs that is expected to last >3 days?: No Does the patient have a NEW difficulty with communication that is expected to last >3 days?: No Is the patient deaf or have difficulty hearing?: No Does the patient have difficulty seeing, even when wearing glasses/contacts?: No Does the patient have difficulty concentrating, remembering, or making decisions?: Yes  Permission Sought/Granted Permission sought to share information with : Family Supports, Oceanographer granted to share information with : Yes, Verbal Permission Granted  Share Information with NAME: [name],Ronald  Permission granted to share info w AGENCY: HH  Permission granted to share info w Relationship: son  Permission granted to share info w Contact Information: [phone]  Emotional Assessment Appearance:: Appears stated age Attitude/Demeanor/Rapport: Engaged Affect (typically observed): Stable Orientation: : Oriented to Self, Oriented to Place, Oriented to  Time, Oriented to Situation Alcohol  / Substance Use: Not Applicable Psych Involvement: No (comment)  Admission diagnosis:  CHF exacerbation (HCC) [I50.9] Urinary tract infection without hematuria, site unspecified [N39.0] Acute on chronic congestive heart failure, unspecified heart failure type [facility]) [I50.9] Patient Active Problem List   Diagnosis Date Noted   PICC (peripherally inserted central catheter) in place 01/30/2024  Medication monitoring encounter 01/30/2024   Infection due to Streptococcus mitis group 01/30/2024   Generalized weakness 01/14/2024   Stage 4 chronic kidney disease (HCC) 01/13/2024   Dehydration 01/12/2024   Acute on chronic systolic heart failure (HCC)  87/72/7974   Fatigue 01/11/2024   RSV infection 12/31/2023   Acute hypoxic respiratory failure (HCC) 12/30/2023   Frequent loose stools 12/24/2023   Bacteremia 12/22/2023   Hallucinations 12/22/2023   Subacute bacterial endocarditis 12/22/2023   Infected pacemaker 12/22/2023   Chronic nausea 12/22/2023   Altered mental status 12/20/2023   Abdominal pain, UTI 12/20/2023   Acute on chronic diastolic heart failure (HCC) 12/20/2023   Chronic kidney disease (CKD), stage 4 (HCC) 12/20/2023   Dizziness 12/20/2023   Lumbar pain 11/25/2023   Indwelling catheter present on admission 11/24/2023   Urinary tract infection associated with indwelling urethral catheter 09/26/2023   Heart failure with preserved ejection fraction (HCC) 08/09/2023   Chronic health problem 06/14/2023   Acute on chronic heart failure (HCC) 06/14/2023   AKI (acute kidney injury) 02/11/2023   T2DM (type 2 diabetes mellitus) (HCC) 02/08/2023   Meningioma (HCC) 04/06/2022   CHF exacerbation (HCC) 02/27/2022   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   Cholesteatoma 12/24/2021   Advanced care planning/counseling discussion 09/29/2021   COPD exacerbation (HCC) 03/15/2021   Chronic back pain 11/14/2018   Pulmonary nodules 03/03/2018   Paroxysmal atrial fibrillation (HCC) s/p cardioversion  01/28/2018   Dependence on wheelchair 05/17/2017   Abdominal aortic ectasia 01/16/2017   Renal cyst 01/16/2017   CKD stage IIIb 09/12/2016   Dyspnea with Chronic COPD 04/15/2015   Well controlled type 2 diabetes mellitus (HCC) 03/20/2012   Gout 09/26/2011   UTI 06/21/2011   Hypertension, isolated systolic 04/26/2011   Tardive dyskinesia 04/26/2011   Artificial cardiac pacemaker 03/30/2010   Vaginal prolapse 03/27/2010   Obstructive sleep apnea 03/27/2010   Osteoarthritis of right knee 03/27/2010   Resting tremor 12/15/2009   Sick sinus syndrome (HCC) 04/24/2006   Schizophrenia (HCC) 03/14/2006   Chronic obstructive pulmonary  disease (HCC) 03/14/2006   PCP:  Delores Suzann HERO, MD Pharmacy:   [facility] [place], [place] - [address] [address] [place] [address] Phone: [phone] Fax: [phone]  [name] [address] [place] [address] Phone: [phone] Fax: [phone]     Social Drivers of Health (SDOH) Social History: SDOH Screenings   Food Insecurity: No Food Insecurity (02/06/2024)  Housing: Low Risk (02/06/2024)  Transportation Needs: No Transportation Needs (02/06/2024)  Utilities: Not At Risk (02/06/2024)  Depression (PHQ2-9): High Risk (09/30/2023)  Financial Resource Strain: Low Risk (03/23/2022)  Social Connections: Socially Isolated (02/06/2024)  Tobacco Use: Medium Risk (02/06/2024)   SDOH Interventions: Social Connections Interventions: Inpatient TOC, Other (Comment) (Patient is from home with son)   Readmission Risk Interventions    01/02/2024    1:34 PM 12/20/2023    2:55 PM 11/26/2023   11:22 AM  Readmission Risk Prevention Plan  Transportation Screening Complete Complete Complete  PCP or Specialist Appt within 3-5 Days   Complete  HRI or Home Care Consult   Complete  Social Work Consult for Recovery Care Planning/Counseling   Complete  Palliative Care Screening   Not Applicable  Medication Review Oceanographer) Complete Complete Referral to Pharmacy  PCP or Specialist appointment within 3-5 days of discharge  Complete   HRI or Home Care Consult Complete Complete   Palliative  Care Screening Not Applicable Not Applicable   Skilled Nursing Facility Not Applicable

## 2024-02-08 DIAGNOSIS — I5043 Acute on chronic combined systolic (congestive) and diastolic (congestive) heart failure: Secondary | ICD-10-CM | POA: Diagnosis not present

## 2024-02-08 DIAGNOSIS — Z960 Presence of urogenital implants: Secondary | ICD-10-CM | POA: Diagnosis not present

## 2024-02-08 DIAGNOSIS — N179 Acute kidney failure, unspecified: Secondary | ICD-10-CM | POA: Diagnosis not present

## 2024-02-08 LAB — BASIC METABOLIC PANEL WITH GFR
Anion gap: 14 (ref 5–15)
Anion gap: 14 (ref 5–15)
BUN: 42 mg/dL — ABNORMAL HIGH (ref 8–23)
BUN: 42 mg/dL — ABNORMAL HIGH (ref 8–23)
CO2: 20 mmol/L — ABNORMAL LOW (ref 22–32)
CO2: 17 mmol/L — ABNORMAL LOW (ref 22–32)
Calcium: 9.6 mg/dL (ref 8.9–10.3)
Calcium: 9.7 mg/dL (ref 8.9–10.3)
Chloride: 99 mmol/L (ref 98–111)
Chloride: 100 mmol/L (ref 98–111)
Creatinine, Ser: 4.34 mg/dL — ABNORMAL HIGH (ref 0.44–1.00)
Creatinine, Ser: 4.48 mg/dL — ABNORMAL HIGH (ref 0.44–1.00)
GFR, Estimated: 10 mL/min — ABNORMAL LOW
GFR, Estimated: 9 mL/min — ABNORMAL LOW
Glucose, Bld: 162 mg/dL — ABNORMAL HIGH (ref 70–99)
Glucose, Bld: 138 mg/dL — ABNORMAL HIGH (ref 70–99)
Potassium: 4.3 mmol/L (ref 3.5–5.1)
Potassium: 4.7 mmol/L (ref 3.5–5.1)
Sodium: 134 mmol/L — ABNORMAL LOW (ref 135–145)
Sodium: 130 mmol/L — ABNORMAL LOW (ref 135–145)

## 2024-02-08 LAB — MAGNESIUM: Magnesium: 2.1 mg/dL (ref 1.7–2.4)

## 2024-02-08 MED ORDER — FUROSEMIDE 10 MG/ML IJ SOLN
40.0000 mg | Freq: Once | INTRAMUSCULAR | Status: AC
  Administered 2024-02-08: 40 mg via INTRAVENOUS
  Filled 2024-02-08: qty 4

## 2024-02-08 NOTE — Consult Note (Signed)
 Renal Service Consult Note [facility] [name] JONETTA Fret, MD  Patient: Abigail Wallace [facility] Date: 02/08/2024 Requesting Physician: Dr. Madelon  Reason for Consult: Renal failure  HPI: The patient is a 79 y.o. year-old w/ PMH significant for atrial fib, CHF, COPD, CAD, HTN, schizophrenia, diabetes type 2 who presented on 02/06/2024 complaining of generalized pain.  At her right chest catheter removed recently.  Has a Foley placed.  Complained of shortness of breath and was placed on 2 L O2.  In the ED blood pressure was 116/89, HR 60, RR 18, temp 98.  100% O2 sat on 2 L nasal cannula O2.  Labs showed K+ 3.7, BUN 26, creatinine 2.80, calcium  10.8, albumin  3.2, lactic acid 2.0.  WBC 10K, hemoglobin 10.2.  Urinalysis showed large LE, protein 100, WBC greater than 50, RBC 6-10, epi 0-5.  Chest x-ray was rotated and showed limited evaluation of the left lower hemithorax, otherwise within that constraint, no acute cardiopulmonary abnormality.  Patient was admitted for CHF exacerbation.  Baseline creatinine 2.3-2.6.  Chronic indwelling Foley.  Possible UTI.  IV Lasix  was started.  Next day weight was down 7 kg which does not seem right.  Creatinine went up so Lasix  was held.  The next day weight went up 3 kg and volume on exam was up.  Lasix  was redosed.  Nephrology is asked to see for renal failure.   Pt seen in room. Pt states she is bedbound. She is f/b Dr Tobie at [facility]. She was admitted for urinary symptoms.  Denies any SOB.    ROS - denies CP, no joint pain, no HA, no blurry vision, no rash, no diarrhea, no nausea/ vomiting   Past Medical History  Past Medical History:  Diagnosis Date   Abdominal wall hernia 01/2017   Advanced care planning/counseling discussion 09/29/2021   Atrial fibrillation (HCC)    CHF (congestive heart failure) (HCC)    CKD (chronic kidney disease)    COPD (chronic obstructive pulmonary disease) (HCC)    Coronary artery disease    NON-CRITICAL   Ectasis  aorta    Episode of change in speech 01/12/2024   GERD (gastroesophageal reflux disease)    Gout    History of Sundowning 06/15/2023   Hypertension    Memory difficulties 12/02/2013   Pacemaker    Paranoid schizophrenia (HCC)    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         Tardive dyskinesia 04/26/2011   Type 2 diabetes mellitus [facility])    Past Surgical History  Past Surgical History:  Procedure Laterality Date   CARDIAC CATHETERIZATION  2007   Non-critical.    CARDIOVERSION N/A 01/27/2021   Procedure: CARDIOVERSION;  Surgeon: Barbaraann Darryle Ned, MD;  Location: [facility] ENDOSCOPY;  Service: Cardiovascular;  Laterality: N/A;   CHOLECYSTECTOMY     ELBOW SURGERY     EP IMPLANTABLE DEVICE N/A 08/31/2014   Procedure: PPM Generator Changeout;  Surgeon: Jerel Balding, MD;  Location: [facility] INVASIVE CV LAB;  Service: Cardiovascular;  Laterality: N/A;   INSERT / REPLACE / REMOVE PACEMAKER  2007   [place]/SYMPTOMATIC HEART BLOCK   IR TUNNELED CENTRAL VENOUS CATH [facility]  12/26/2023   PACEMAKER PLACEMENT  09/28/2005   2/2 SSS?   Persantine stress test  05/01/2010   EF 66%. Normal LV sys fx. Unchanged from previous studies.    TRANSESOPHAGEAL ECHOCARDIOGRAM (CATH LAB) N/A 12/23/2023  Procedure: TRANSESOPHAGEAL ECHOCARDIOGRAM;  Surgeon: Shlomo Wilbert SAUNDERS, MD;  Location: [facility] INVASIVE CV LAB;  Service: Cardiovascular;  Laterality: N/A;   TRANSTHORACIC ECHOCARDIOGRAM  09/08/10   SEVERE CONCENTRIC HYPERTROPHY.LV FUNCTION WAS VIGOROUS.EF 65%-70%.VENTRICULAR SEPTUM-INCOORDINATE MOTION.LEFT ATRIUM-MILDLY DILATED.TRIVIAL TR.   TUBAL LIGATION     Family History  Family History  Problem Relation Age of Onset   Heart disease Mother    Heart disease Father    Stroke Sister    Diabetes type II Son    Social History  reports that she quit smoking about 1 years ago. Her smoking use included cigarettes. She has never used smokeless  tobacco. She reports that she does not drink alcohol  and does not use drugs. Allergies Allergies[1] Home medications Prior to Admission medications  Medication Sig Start Date End Date Taking? Authorizing Provider  acetaminophen  (TYLENOL ) 500 MG tablet Take 500-1,000 mg by mouth every 6 (six) hours as needed for mild pain (pain score 1-3) or headache.   Yes [provider]  albuterol  (VENTOLIN  HFA) 108 (90 Base) MCG/ACT inhaler Inhale 2 puffs into the lungs every 6 (six) hours as needed for wheezing or shortness of breath. 11/11/23  Yes Delores Suzann HERO, MD  allopurinol  (ZYLOPRIM ) 100 MG tablet Take 0.5 tablets (50 mg total) by mouth in the morning. 01/16/24  Yes Nicholas Bar, MD  [Paused] amLODipine  (NORVASC ) 10 MG tablet Take 1 tablet (10 mg total) by mouth at bedtime. Wait to take this until your doctor or other care provider tells you to start again. 10/11/23  Yes Delores Suzann HERO, MD  amoxicillin  (AMOXIL ) 500 MG capsule Take 1 capsule (500 mg total) by mouth in the morning and at bedtime. 01/30/24  Yes Manandhar, Sabina, MD  apixaban  (ELIQUIS ) 2.5 MG TABS tablet Take 1 tablet (2.5 mg total) by mouth 2 (two) times daily. 01/20/24  Yes Delores Suzann HERO, MD  cholecalciferol  (VITAMIN D3) 25 MCG (1000 UNIT) tablet Take 1 tablet (1,000 Units total) by mouth daily. 08/12/23  Yes Rumball, Alison M, DO  diclofenac  Sodium (VOLTAREN ) 1 % GEL Apply 2 g topically 4 (four) times daily as needed (pain). 08/19/23  Yes Delores Suzann HERO, MD  divalproex  (DEPAKOTE ) 250 MG DR tablet Take 1 tablet (250 mg total) by mouth every 12 (twelve) hours. 03/05/22  Yes Samtani, Jai-Gurmukh, MD  Fluticasone -Umeclidin-Vilant (TRELEGY ELLIPTA ) 100-62.5-25 MCG/ACT AEPB Inhale 1 puff into the lungs daily. 11/11/23  Yes Delores Suzann HERO, MD  furosemide  (LASIX ) 40 MG tablet Take 1 tablet (40 mg total) by mouth daily. 01/16/24 01/15/25 Yes Nicholas Bar, MD  ipratropium-albuterol  (DUONEB) 0.5-2.5 (3) MG/3ML SOLN Take 3 mLs by nebulization  every 4 (four) hours as needed. Patient taking differently: Take 3 mLs by nebulization every 4 (four) hours as needed (for shortness of breath). 11/11/23  Yes Delores Suzann HERO, MD  linagliptin  (TRADJENTA ) 5 MG TABS tablet Take 1 tablet (5 mg total) by mouth daily. 12/27/23  Yes Baloch, Mahnoor, MD  loperamide  (IMODIUM ) 2 MG capsule Take 1 capsule (2 mg total) by mouth as needed for diarrhea or loose stools. 12/27/23  Yes Baloch, Mahnoor, MD  metoprolol  tartrate (LOPRESSOR ) 100 MG tablet Take 1 tablet (100 mg total) by mouth 3 (three) times daily. 09/04/23  Yes Delores Suzann HERO, MD  Multiple Vitamins-Minerals (MULTIVITAMIN WOMEN 50+) TABS Take 1 tablet by mouth daily with breakfast.   Yes [provider]  nystatin  cream (MYCOSTATIN ) Apply 1 Application topically daily as needed (Rash). Patient taking differently: Apply 1 Application topically daily as needed  for dry skin. 11/21/23  Yes Delores Suzann HERO, MD  nystatin  powder Apply 1 Application topically daily. Apply after bathing. Patient taking differently: Apply 1 Application topically daily as needed (bacteria). Apply after bathing. 10/04/23  Yes Delores Suzann HERO, MD  OLANZapine  (ZYPREXA ) 10 MG tablet Take 15 mg by mouth at bedtime. 05/17/23  Yes [provider]  OVER THE COUNTER MEDICATION Apply 1 application  topically See admin instructions. Caldesene Medicated Protecting Powder- Apply under the breasts and between the skin folds 2 times a day as needed for irritation   Yes [provider]  oxyCODONE  (ROXICODONE ) 5 MG immediate release tablet Take 1 tablet (5 mg total) by mouth every 12 (twelve) hours as needed for severe pain (pain score 7-10). 01/21/24  Yes Delores Suzann HERO, MD  pravastatin  (PRAVACHOL ) 40 MG tablet TAKE 1 TABLET (40 MG TOTAL) BY MOUTH EVERY EVENING. 01/31/24  Yes Croitoru, Mihai, MD  sodium bicarbonate  650 MG tablet Take 1 tablet (650 mg total) by mouth 3 (three) times daily. 12/05/23  Yes Delores Suzann HERO, MD   triamcinolone  ointment (KENALOG ) 0.5 % Apply 1 Application topically 2 (two) times daily as needed (skin irritation). 10/04/23  Yes Delores Suzann HERO, MD  Zinc  Oxide (DESITIN EX) Apply 1 Application topically daily as needed (rash).   Yes [provider]  glucose blood (ACCU-CHEK GUIDE TEST) test strip Use to check blood glucose once daily and as needed for symptoms 11/08/23   Delores Suzann HERO, MD  Lancets (FREESTYLE) lancets Use to check blood glucose once daily and as needed for symptoms 11/08/23   Delores Suzann HERO, MD  nitroGLYCERIN  (NITROSTAT ) 0.4 MG SL tablet Place 1 tablet (0.4 mg total) under the tongue every 5 (five) minutes as needed for chest pain. 08/03/22 01/30/24  Croitoru, Mihai, MD  polycarbophil (FIBERCON) 625 MG tablet Take 1 tablet (625 mg total) by mouth daily. Patient not taking: Reported on 02/06/2024 12/27/23   Lonnie Earnest, MD     Vitals:   02/08/24 0500 02/08/24 0600 02/08/24 0823 02/08/24 1300  BP:   (!) 105/59 (!) 105/43  Pulse: 61 63 72 73  Resp: 18 14 15 18   Temp:   98.9 F (37.2 C) 98.9 F (37.2 C)  TempSrc:   Oral Oral  SpO2: 95% 97% 94% 98%  Weight:      Height:       Exam Gen alert, no distress Sclera anicteric, throat clear  No jvd or bruits Chest R side east to access, clear to a/p Lying on L side, not able to assess RRR no MRG Abd soft ntnd no mass or ascites +bs Ext no LE or UE edema, no other edema Neuro is alert, Ox 3 , nf   Home bp meds: Norvasc  Lasix  Metoprolol      Date   Creat  eGFR   AKI peak Cr  2008- 2016  0.70- 1.00 2017- 2021  1.11- 1.44    2.30 2022- 2023  1.50- 1.81 2024   1.52- 1.92    2.73 Jan- June 2025 1.92- 2.43    2.92 Sept - oct 2025 2.66- 3.04    3.41 Dec 2025  2.40- 2.90 16-20 ml/min  3.88 1/22   2.53  19 1/23   3.27  14  02/08/24  4.34  10 ml/miln       UA - large LE, prot 100, 6-10 rbc, >50 wbc, 0-5 epi UNa, UCr pending   CT abd /pelv noncontrast -> chronic polycystic renal disease, bladder  decompressed by foley catheter, no stones in the kidneys or ureters, no hydronephrosis; no perinephric or periureteral stranding CXR 1/22 - was rotated and showed limited evaluation of the left lower hemithorax, otherwise within that constraint, no acute cardiopulmonary abnormality.  Labs today --> Na 133, K+ 4.7, bun 42, creat 4.48     Assessment/ Plan:  # AKI on CKD 4 - b/l creatinine 2.4- 2.9 from dec 2025, eGFR 16-20 ml/min - creat 2.5 on admit 1/22 here in setting of suspected decomp CHF - given IV lasix  40mg  x 2 on 1/22, and creat bumped to 3.2 yest and 4.3 today - relatively good UOP 0.6- 1.0/ day since admission - 1/22 CXR shows normal R side, L side obscured do to position - was 2L [place] at admission, on RA today  - on exam no edema, clear lungs, not SOB - would hold off on diuretics right now - recommended gentle saline 0.9% -->  pt refused until I can talk to dr Tobie - will d/w Dr Tobie in the morning     # Volume - euvolemic at this time - hold diuretics  # bedbound state  # abd pain / UTI - admitting issue        Myer Fret  MD [facility] 02/08/2024, 6:31 PM  Recent Labs  Lab 02/08/24 0243 02/08/24 1442  CREATININE 4.34* 4.48*  K 4.3 4.7   Inpatient medications:  allopurinol   50 mg Oral q AM   amoxicillin   500 mg Oral Q12H   apixaban   2.5 mg Oral BID   budesonide -glycopyrrolate -formoterol   2 puff Inhalation BID   Chlorhexidine  Gluconate Cloth  6 each Topical Daily   divalproex   250 mg Oral Q12H   linagliptin   5 mg Oral Daily   multivitamin with minerals  1 tablet Oral Daily   OLANZapine   15 mg Oral QHS   pravastatin   40 mg Oral QPM   sodium bicarbonate   650 mg Oral TID    acetaminophen , ipratropium-albuterol , nitroGLYCERIN , oxyCODONE       [1]  Allergies Allergen Reactions   Abilify [Aripiprazole] Anaphylaxis, Shortness Of Breath and Other (See Comments)    Tremors, also   Keflex  [Cephalexin ] Swelling and Other (See Comments)    Tongue  swelling 12/2023 tolerated ceftriaxone  with no reported issues   Versacloz  [Clozapine ] Anaphylaxis   Ativan  [Lorazepam ] Other (See Comments)    Unknown reaction   Benadryl [Diphenhydramine Hcl] Other (See Comments)    Vision changes   Benztropine  Other (See Comments)    Mobility issues   Latuda [Lurasidone Hcl] Other (See Comments)    Tremors   Remeron [Mirtazapine] Other (See Comments)    Tremors   Haldol  [Haloperidol  Lactate] Other (See Comments)    Tardive dyskinesias  Tremors Somnolence Speech changes Sialorrhea    Codeine Other (See Comments)    Unknown reaction   Latex Rash

## 2024-02-08 NOTE — Assessment & Plan Note (Addendum)
 Cr 3.27 > 4.34. Redosing Lasix  as patient is volume up but with close monitoring of Cr. - PM BMP to recheck Cr after restarting Lasix  - Consider consulting Nephrology if worsening Cr or hypotension - Avoid nephrotoxic agents, encourage hydration - AM BMP

## 2024-02-08 NOTE — Assessment & Plan Note (Addendum)
 Total output -20 mL, weight up 3 kg. Volume up on exam today. Patient is also hypotensive but with MAPs >65 so appropriate to redose Lasix  with monitoring of BPs. - IV Lasix  40mg  x1 today - 1500 BMP to monitor Cr - Consider referral to nephrology as below - Continue Breztri  BID, Duonebs q4hrs PRN - Strict I&Os, daily weight - Fall precautions - PT/OT consult - AM BMP, Mg

## 2024-02-08 NOTE — Progress Notes (Signed)
 "    Daily Progress Note Intern Pager: [phone]  Patient name: Abigail Wallace [facility] Medical record number: [id number] Date of birth: September 28, 1945 Age: 79 y.o. Gender: female  Primary Care Provider: Delores Suzann HERO, MD Consultants: None Code Status: Full code  Pt Overview and Major Events to Date:  1/22 - Admitted  Assessment and Plan: Avice Funchess [facility] is a 79 y.o.female with PMHx T2DM, strep bacteremia s/p infected PPM, gout, A-fib, schizophrenia, HLD admitted for CHF exacerbation. Lasix  held given AKI but will continue diuresing today and continue monitoring Cr. Stopping abx given no evidence of UTI on cultures.  Assessment & Plan CHF exacerbation (HCC) Total output -20 mL, weight up 3 kg. Volume up on exam today. Patient is also hypotensive but with MAPs >65 so appropriate to redose Lasix  with monitoring of BPs. - IV Lasix  40mg  x1 today - 1500 BMP to monitor Cr - Consider referral to nephrology as below - Continue Breztri  BID, Duonebs q4hrs PRN - Strict I&Os, daily weight - Fall precautions - PT/OT consult - AM BMP, Mg AKI (acute kidney injury) Cr 3.27 > 4.34. Redosing Lasix  as patient is volume up but with close monitoring of Cr. - PM BMP to recheck Cr after restarting Lasix  - Consider consulting Nephrology if worsening Cr or hypotension - Avoid nephrotoxic agents, encourage hydration - AM BMP Abdominal pain, UTI (Resolved: 02/08/2024) Afebrile. Stop abx given Bcx with NG @ 2 days and no evidence of UTI on Ucx.  - Discontinue Cefepime  1g daily  Chronic health problem - T2DM - continue home Linagliptin  5mg  daily, CBG Q4H, Sensitive SSI  - Strep bacteremia s/p infected PPM - previously on IV PCN-G (stop on 1/17), transition to Amoxicillin  5 mg PO BID for life long starting 01/31/24 per ID note on 01/30/24 - Gout - continue home Allopurinol  50mg  daily - A-fib - continue home Eliquis  2.5 mg BID, continuous cardiac monitoring  - Schizophrenia : Continue home Depakote  250  mg BID, Olanzapine  15 mg daily - HLD - continue home Pravastatin  40 mg daily  - Chronic back pain - Tylenol  650 q6hrs PRN, oxycodone  5mg  q12hrs PRN - CKD - continue sodium bicarb 650mg  TID - HTN - holding home Amlodipine  10mg  daily 2/2 hypotension - Continue home Nystatin  cream and powder PRN for fungal infection  FEN/GI: Carb modified PPx: Eliquis  Dispo:Pending PT recommendations  pending clinical improvement   Subjective:  Patient resting in bed with son, [name], at bedside. Patient states she her back is hurting. Encouraged her to ask for PRN meds. [name] inquired blood pressures which are low. Reassured him she is asymptomatic with stable MAPs and that we are holding her home amlodipine  accordingly.  Objective: Temp:  [98.1 F (36.7 C)-99.6 F (37.6 C)] 99.6 F (37.6 C) (01/24 0319) Pulse Rate:  [59-70] 59 (01/24 0319) Resp:  [17-22] 21 (01/24 0319) BP: (89-116)/(46-60) 94/51 (01/24 0319) SpO2:  [96 %-98 %] 96 % (01/24 0319) Weight:  [148.3 kg] 148.3 kg (01/24 0319)  Physical Exam: General: Alert, NAD Cardiovascular: RRR, no m/r/g though difficult to auscultate 2/2 body habitus Pulmonary: Normal WOB. CTAB with no w/c/r though difficult to auscultate 2/2 body habitus Abdomen: Soft, non-tender, non-distended. Extremities: Warm, 1+ pitting edema in LE, R>L  Laboratory: Most recent CBC Lab Results  Component Value Date   WBC 9.9 02/07/2024   HGB 9.9 (L) 02/07/2024   HCT 33.0 (L) 02/07/2024   MCV 100.3 (H) 02/07/2024   PLT 175 02/07/2024   Most recent BMP  Latest Ref Rng & Units 02/08/2024    2:43 AM  BMP  Glucose 70 - 99 mg/dL 837   BUN 8 - 23 mg/dL 42   Creatinine 9.55 - 1.00 mg/dL 5.65   Sodium 864 - 854 mmol/L 134   Potassium 3.5 - 5.1 mmol/L 4.3   Chloride 98 - 111 mmol/L 99   CO2 22 - 32 mmol/L 20   Calcium  8.9 - 10.3 mg/dL 9.6     Jerrie Gathers, DO 02/08/2024, 7:16 AM  PGY-1, [facility] FPTS Intern pager: [phone], text pages  welcome Secure chat group [facility] Teaching Service   "

## 2024-02-08 NOTE — Assessment & Plan Note (Addendum)
-   T2DM - continue home Linagliptin  5mg  daily, CBG Q4H, Sensitive SSI  - Strep bacteremia s/p infected PPM - previously on IV PCN-G (stop on 1/17), transition to Amoxicillin  5 mg PO BID for life long starting 01/31/24 per ID note on 01/30/24 - Gout - continue home Allopurinol  50mg  daily - A-fib - continue home Eliquis  2.5 mg BID, continuous cardiac monitoring  - Schizophrenia : Continue home Depakote  250 mg BID, Olanzapine  15 mg daily - HLD - continue home Pravastatin  40 mg daily  - Chronic back pain - Tylenol  650 q6hrs PRN, oxycodone  5mg  q12hrs PRN - CKD - continue sodium bicarb 650mg  TID - HTN - holding home Amlodipine  10mg  daily 2/2 hypotension - Continue home Nystatin  cream and powder PRN for fungal infection

## 2024-02-08 NOTE — Assessment & Plan Note (Addendum)
 Afebrile. Stop abx given Bcx with NG @ 2 days and no evidence of UTI on Ucx.  - Discontinue Cefepime  1g daily

## 2024-02-09 DIAGNOSIS — I5043 Acute on chronic combined systolic (congestive) and diastolic (congestive) heart failure: Secondary | ICD-10-CM | POA: Diagnosis not present

## 2024-02-09 DIAGNOSIS — Z960 Presence of urogenital implants: Secondary | ICD-10-CM | POA: Diagnosis not present

## 2024-02-09 DIAGNOSIS — N179 Acute kidney failure, unspecified: Secondary | ICD-10-CM | POA: Diagnosis not present

## 2024-02-09 LAB — BASIC METABOLIC PANEL WITH GFR
Anion gap: 14 (ref 5–15)
BUN: 43 mg/dL — ABNORMAL HIGH (ref 8–23)
CO2: 18 mmol/L — ABNORMAL LOW (ref 22–32)
Calcium: 9.7 mg/dL (ref 8.9–10.3)
Chloride: 98 mmol/L (ref 98–111)
Creatinine, Ser: 4.31 mg/dL — ABNORMAL HIGH (ref 0.44–1.00)
GFR, Estimated: 10 mL/min — ABNORMAL LOW
Glucose, Bld: 190 mg/dL — ABNORMAL HIGH (ref 70–99)
Potassium: 4.4 mmol/L (ref 3.5–5.1)
Sodium: 131 mmol/L — ABNORMAL LOW (ref 135–145)

## 2024-02-09 LAB — MAGNESIUM: Magnesium: 2.2 mg/dL (ref 1.7–2.4)

## 2024-02-09 MED ORDER — SENNA 8.6 MG PO TABS
1.0000 | ORAL_TABLET | Freq: Every day | ORAL | Status: DC
  Administered 2024-02-09 – 2024-02-12 (×4): 8.6 mg via ORAL
  Filled 2024-02-09 (×4): qty 1

## 2024-02-09 MED ORDER — POLYETHYLENE GLYCOL 3350 17 G PO PACK
17.0000 g | PACK | Freq: Every day | ORAL | Status: DC
  Administered 2024-02-09 – 2024-02-12 (×4): 17 g via ORAL
  Filled 2024-02-09 (×4): qty 1

## 2024-02-09 MED ORDER — ALBUMIN HUMAN 25 % IV SOLN
25.0000 g | Freq: Once | INTRAVENOUS | Status: AC
  Administered 2024-02-09: 12.5 g via INTRAVENOUS
  Filled 2024-02-09: qty 100

## 2024-02-09 NOTE — Assessment & Plan Note (Signed)
-   T2DM - continue home Linagliptin  5mg  daily - Strep bacteremia s/p infected PPM - previously on IV PCN-G (stop on 1/17), transition to Amoxicillin  5 mg PO BID for life long starting 01/31/24 per ID note on 01/30/24 - Gout - continue home Allopurinol  50mg  daily - A-fib - continue home Eliquis  2.5 mg BID, continuous cardiac monitoring  - Schizophrenia : Continue home Depakote  250 mg BID, Olanzapine  15 mg daily - HLD - continue home Pravastatin  40 mg daily  - Chronic back pain - Tylenol  650 q6hrs PRN, oxycodone  5mg  q12hrs PRN - CKD - continue sodium bicarb 650mg  TID - HTN - holding home Amlodipine  10mg  daily 2/2 hypotension - COPD - Continue Breztri  BID, Duonebs q4hrs PRN - Continue home Nystatin  cream and powder PRN for fungal infection

## 2024-02-09 NOTE — Plan of Care (Signed)

## 2024-02-09 NOTE — Progress Notes (Addendum)
 "    Daily Progress Note Intern Pager: [phone]  Patient name: Abigail Wallace [facility] Medical record number: [id number] Date of birth: 1945/10/27 Age: 79 y.o. Gender: female  Primary Care Provider: Delores Suzann HERO, MD Consultants: nephrology Code Status: full code  Pt Overview and Major Events to Date:  1/22 admitted  Medical Decision Making:  Abigail Wallace [facility] is a 79 y.o. female admitted for CHF exacerbation, now has developed AKI on CKD.  Pertinent PMH/PSH includes T2DM, strep bacteremia s/p infected PPM, gout, A-fib, schizophrenia, HLD, CAD .  Assessment & Plan CHF exacerbation (HCC) Total output -2.9L since admit, weight down about 4kg since admit. Intermittently hypotensive.  - Appreciate nephrology recommendations as below for AKI - holding diuresis today - Strict I&Os, daily weight - Fall precautions - Appreciate PT/OT - AM BMP, Mg AKI (acute kidney injury) on CKD 4 Worsened in the setting of diuresis. Cr 4.31 from 4.48 yesterday. Baseline closer to 2.4-2.9. - Nephrology consulted, appreciate recommendations - Consider gentle saline today pending nephrology eval - Avoid nephrotoxic agents, encourage hydration - AM BMP Chronic health problem - T2DM - continue home Linagliptin  5mg  daily - Strep bacteremia s/p infected PPM - previously on IV PCN-G (stop on 1/17), transition to Amoxicillin  5 mg PO BID for life long starting 01/31/24 per ID note on 01/30/24 - Gout - continue home Allopurinol  50mg  daily - A-fib - continue home Eliquis  2.5 mg BID, continuous cardiac monitoring  - Schizophrenia : Continue home Depakote  250 mg BID, Olanzapine  15 mg daily - HLD - continue home Pravastatin  40 mg daily  - Chronic back pain - Tylenol  650 q6hrs PRN, oxycodone  5mg  q12hrs PRN - CKD - continue sodium bicarb 650mg  TID - HTN - holding home Amlodipine  10mg  daily 2/2 hypotension - COPD - Continue Breztri  BID, Duonebs q4hrs PRN - Continue home Nystatin  cream and powder PRN for  fungal infection  10:42 AM Called son [name] to provide update on plan of care  FEN/GI: carb modified diet PPx: home eliquis  Dispo:Pending PT recommendations  pending clinical improvement . Barriers include diuresis and AKI.   Subjective:  NAEON resting this morning  Objective: Temp:  [98 F (36.7 C)-98.9 F (37.2 C)] 98.3 F (36.8 C) (01/25 0342) Pulse Rate:  [59-75] 59 (01/25 0352) Resp:  [15-26] 20 (01/25 0352) BP: (105-156)/(39-59) 156/45 (01/25 0342) SpO2:  [93 %-99 %] 99 % (01/25 0352) Weight:  [148.1 kg] 148.1 kg (01/25 0342) Physical Exam: General: NAD , awakens to voice Cardiovascular: RRR Respiratory: CTAB in upper lung fields, distant lung sounds otherwise Abdomen: soft NTND Extremities: trace/1+ BLE  Laboratory: Most recent CBC Lab Results  Component Value Date   WBC 9.9 02/07/2024   HGB 9.9 (L) 02/07/2024   HCT 33.0 (L) 02/07/2024   MCV 100.3 (H) 02/07/2024   PLT 175 02/07/2024   Most recent BMP    Latest Ref Rng & Units 02/09/2024    2:59 AM  BMP  Glucose 70 - 99 mg/dL 809   BUN 8 - 23 mg/dL 43   Creatinine 9.55 - 1.00 mg/dL 5.68   Sodium 864 - 854 mmol/L 131   Potassium 3.5 - 5.1 mmol/L 4.4   Chloride 98 - 111 mmol/L 98   CO2 22 - 32 mmol/L 18   Calcium  8.9 - 10.3 mg/dL 9.7    Mg 2.2   Abigail Booty, MD 02/09/2024, 7:32 AM  PGY-3, [facility] FPTS Intern pager: [phone], text pages welcome Secure chat group [facility]  Hospital Teaching Service  "

## 2024-02-09 NOTE — Care Management Important Message (Signed)
 Important Message  Patient Details  Name: Abigail Wallace MRN: [id number] Date of Birth: [birth year]   Important Message Given:  Yes - Medicare IM     Marval Gell, RN 02/09/2024, 11:57 AM

## 2024-02-09 NOTE — Assessment & Plan Note (Signed)
 Worsened in the setting of diuresis. Cr 4.31 from 4.48 yesterday. Baseline closer to 2.4-2.9. - Nephrology consulted, appreciate recommendations - Consider gentle saline today pending nephrology eval - Avoid nephrotoxic agents, encourage hydration - AM BMP

## 2024-02-09 NOTE — Evaluation (Addendum)
 Physical Therapy Evaluation Patient Details Name: Abigail Wallace MRN: [id number] DOB: 1945-03-26 Today's Date: 02/09/2024  History of Present Illness  79 y.o. female presents to [facility] with worsening SOB, chest pain, and pelvic pain. Admitted with CHF exacerbation and AKI. +UTI resolved. PMHx: SSS s/p PPM on long term ABX for infected pacemaker , A-fib on home eliquis , gout, CKD Stage4, Schizophrenia, COPD, OSA, HTN, Tardive Dyskinesia, meningioma, HFpEF, Chronic foley, and DMII   Clinical Impression  Pt lives at home with her son with assist from son and PCA for all mobility and ADLs. Pt primarily stays in the bed with use of hoyer lift to transfer to a manual WC. Pt presents at mobility baseline requiring TotalAx2 for bed mobility. Once seated on the EOB, pt had an intermittent R lateral lean requiring up to MaxA to support (son reports is baseline). Son reported having a regular air mattress at home with gel overlay for pt's hospital bed. Pt would benefit from an air mattress to reduce risk of pressure injuries. Discussed post-acute PT with pt/son reporting pt had declined HHPT in the past with no new interest. No further acute PT needs identified with acute PT signing off.       If plan is discharge home, recommend the following: Assist for transportation;Help with stairs or ramp for entrance;A lot of help with walking and/or transfers;A lot of help with bathing/dressing/bathroom;Assistance with cooking/housework;Direct supervision/assist for medications management;Direct supervision/assist for financial management   Can travel by private vehicle    No    Equipment Recommendations Other (comment) (air mattress for hospital bed)     Functional Status Assessment Patient has not had a recent decline in their functional status     Precautions / Restrictions Precautions Precautions: Fall Recall of Precautions/Restrictions: Impaired Restrictions Weight Bearing Restrictions Per  Provider Order: No      Mobility  Bed Mobility Overal bed mobility: Needs Assistance Bed Mobility: Rolling, Sit to Supine, Sidelying to Sit Rolling: Total assist, +2 for safety/equipment, +2 for physical assistance Sidelying to sit: Total assist, +2 for physical assistance, +2 for safety/equipment   Sit to supine: Total assist, +2 for physical assistance, +2 for safety/equipment   General bed mobility comments: TotalAx2 for all mobility with use of bed pad    Transfers   General transfer comment: deferred, hoyer lift at baseline      Balance Overall balance assessment: Needs assistance, Mild deficits observed, not formally tested Sitting-balance support: Bilateral upper extremity supported, Feet unsupported Sitting balance-Leahy Scale: Poor Sitting balance - Comments: Fluctuates between MaxA and CGA for R lateral lean Postural control: Right lateral lean        Pertinent Vitals/Pain Pain Assessment Pain Assessment: Faces Faces Pain Scale: Hurts even more Pain Location: R hip Pain Descriptors / Indicators: Aching, Discomfort Pain Intervention(s): Limited activity within patient's tolerance, Monitored during session, Repositioned    Home Living Family/patient expects to be discharged to:: Private residence Living Arrangements: Children Available Help at Discharge: Family;Available 24 hours/day;Personal care attendant (Aide M-F from 7am-6pm; son and aide assist wtih all ADLs/mobility needs) Type of Home: Apartment Home Access: Level entry   Home Layout: One level Home Equipment: Shower seat;BSC/3in1;Tub bench;Rollator (4 wheels);Hospital bed;Wheelchair - Careers Adviser (comment) Additional Comments: hoyer lift, PCA Mon - Fri from 7a- 3 pm and 3pm - 7 pm.    Prior Function Prior Level of Function : Needs assist    Mobility Comments: primarily stays in the  bed with use of hoyer lift when getting to  WC ADLs Comments: son/PCA assist with all aspects of AD's, son gets her in  W/C via hoyer to wash her hair; pt able to feed self     Extremity/Trunk Assessment   Upper Extremity Assessment Upper Extremity Assessment: Defer to OT evaluation    Lower Extremity Assessment Lower Extremity Assessment: Generalized weakness    Cervical / Trunk Assessment Cervical / Trunk Assessment: Other exceptions (increased body habitus)  Communication   Communication Communication: Impaired Factors Affecting Communication: Reduced clarity of speech    Cognition Arousal: Alert Behavior During Therapy: Restless   PT - Cognitive impairments: Awareness, Memory, Attention, Initiation, Sequencing, Problem solving, Safety/Judgement    Following commands: Impaired Following commands impaired: Follows one step commands inconsistently     Cueing Cueing Techniques: Verbal cues, Tactile cues, Visual cues     General Comments General comments (skin integrity, edema, etc.): Son providing +2 assist and history     PT Assessment Patient does not need any further PT services         PT Goals (Current goals can be found in the Care Plan section)  Acute Rehab PT Goals PT Goal Formulation: All assessment and education complete, DC therapy     AM-PAC PT 6 Clicks Mobility  Outcome Measure Help needed turning from your back to your side while in a flat bed without using bedrails?: Total Help needed moving from lying on your back to sitting on the side of a flat bed without using bedrails?: Total Help needed moving to and from a bed to a chair (including a wheelchair)?: Total Help needed standing up from a chair using your arms (e.g., wheelchair or bedside chair)?: Total Help needed to walk in hospital room?: Total Help needed climbing 3-5 steps with a railing? : Total 6 Click Score: 6    End of Session   Activity Tolerance: Patient tolerated treatment well Patient left: in bed;with call bell/phone within reach;with nursing/sitter in room;with family/visitor present Nurse  Communication: Mobility status;Need for lift equipment PT Visit Diagnosis: Other abnormalities of gait and mobility (R26.89);Unsteadiness on feet (R26.81);Muscle weakness (generalized) (M62.81)    Time: 8878-8853 PT Time Calculation (min) (ACUTE ONLY): 25 min   Charges:   PT Evaluation $PT Eval Moderate Complexity: 1 Mod   PT General Charges $$ ACUTE PT VISIT: 1 Visit        Kate ORN, PT, DPT Secure Chat Preferred  Rehab Office [phone]   Kate BRAVO Wendolyn 02/09/2024, 12:43 PM

## 2024-02-09 NOTE — Assessment & Plan Note (Addendum)
 Total output -2.9L since admit, weight down about 4kg since admit. Intermittently hypotensive.  - Appreciate nephrology recommendations as below for AKI - holding diuresis today - Strict I&Os, daily weight - Fall precautions - Appreciate PT/OT - AM BMP, Mg

## 2024-02-09 NOTE — Progress Notes (Signed)
 [facility] Progress Note  Subjective:  Seen in room Pt's son at bedside Pt w/o any complaints  Per the son, they used to see Dr Tobie a while ago, but now due to her limited mobility, they have not been able to see him much  Presentation summary:  79 y.o. year-old w/ PMH significant for atrial fib, CHF, COPD, CAD, HTN, schizophrenia, diabetes type 2 who presented on 02/06/2024 complaining of generalized pain.  At her right chest catheter removed recently.  Has a Foley placed.  Complained of shortness of breath and was placed on 2 L O2.  In the ED blood pressure was 116/89, HR 60, RR 18, temp 98.  100% O2 sat on 2 L nasal cannula O2.  Labs showed K+ 3.7, BUN 26, creatinine 2.80, calcium  10.8, albumin  3.2, lactic acid 2.0.  WBC 10K, hemoglobin 10.2.  Urinalysis showed large LE, protein 100, WBC greater than 50, RBC 6-10, epi 0-5.  Chest x-ray was rotated and showed limited evaluation of the left lower hemithorax, otherwise within that constraint, no acute cardiopulmonary abnormality.  Patient was admitted for CHF exacerbation.  Baseline creatinine 2.3-2.6.  Chronic indwelling Foley.  Possible UTI.  IV Lasix  was started.  Next day weight was down 7 kg which does not seem right.  Creatinine went up so Lasix  was held.  The next day weight went up 3 kg and volume on exam was up.  Lasix  was redosed.  Nephrology is asked to see for renal failure.     Pt seen in room. Pt states she is bedbound. She is f/b Dr Tobie at [facility]. She was admitted for urinary symptoms.  Denies any SOB.   Vitals:   02/09/24 0018 02/09/24 0342 02/09/24 0352 02/09/24 0806  BP: (!) 133/39 (!) 156/45  (!) 115/54  Pulse: 61 (!) 59 (!) 59 73  Resp: 18 (!) 26 20 20   Temp: 98 F (36.7 C) 98.3 F (36.8 C)  97.9 F (36.6 C)  TempSrc: Oral Oral  Oral  SpO2: 98% 97% 99% 98%  Weight:  (!) 148.1 kg    Height:        Exam: Gen alert, no distress Sclera anicteric, throat clear  No jvd or bruits Chest R side east to  access, clear to a/p Lying on L side, not able to assess RRR no MRG Abd soft ntnd no mass or ascites +bs Ext no LE or UE edema, no other edema Neuro is alert, Ox 3 , nf     Home bp meds: Norvasc  Lasix  Metoprolol         Date                             Creat               eGFR                           AKI peak Cr      2008- 2016                  0.70- 1.00 2017- 2021                  1.11- 1.44  2.30 2022- 2023                  1.50- 1.81 2024                            1.52- 1.92                                            2.73 Jan- June 2025           1.92- 2.43                                            2.92 Sept - oct 2025           2.66- 3.04                                            3.41 Dec 2025                     2.40- 2.90        16-20 ml/min               3.88 1/22                             2.53                 19 1/23                             3.27                 14         02/08/24                        4.34                 10 ml/miln      UA - large LE, prot 100, 6-10 rbc, >50 wbc, 0-5 epi UNa, UCr pending   CT abd /pelv noncontrast -> chronic polycystic renal disease, bladder decompressed by foley catheter, no stones in the kidneys or ureters, no hydronephrosis; no perinephric or periureteral stranding CXR 1/22 - was rotated and showed limited evaluation of the left lower hemithorax, otherwise within that constraint, no acute cardiopulmonary abnormality.  Labs today --> Na 133, K+ 4.7, bun 42, creat 4.48         Assessment/ Plan:   # AKI on CKD 4 - b/l creatinine 2.4- 2.9 from dec 2025, eGFR 16-20 ml/min - creat 2.5 on admit 1/22 here in setting of suspected decomp CHF - given IV lasix  40mg  x 2 on 1/22, and creat bumped to 3.2 yest and 4.3 today - relatively good UOP 0.6- 1.0/ day since admission - foley in place, UA +wbc's - 1/22 CXR showed normal R side, L side obscured due to position - was 2L [place] at  admission, on RA today  - no edema on exam, clear lungs, not SOB, no  resp issues - bp's are good/ stable - holding diuretics for now - creatinine stable today at 4.31 - poor candidate for HD HD due to debility/ bedbound state - pt refused low-dose saline ->  ordered IV albumin  25 gm x 1 - f/u labs in am        # Volume - euvolemic at this time - holding diuretics   # bedbound state   # abd pain / UTI - admitting issue           Myer Fret MD  [facility] 02/09/2024, 1:52 PM  Recent Labs  Lab 02/06/24 0154 02/06/24 0211 02/07/24 0304 02/08/24 0243 02/08/24 1442 02/09/24 0259  HGB 10.1* 10.2*  10.5* 9.9*  --   --   --   ALBUMIN  3.2*  --   --   --   --   --   CALCIUM  10.8*  --  10.2   < > 9.7 9.7  CREATININE 2.53* 2.80* 3.27*   < > 4.48* 4.31*  K 3.8 3.7  3.7 4.4   < > 4.7 4.4   < > = values in this interval not displayed.   No results for input(s): IRON, TIBC, FERRITIN in the last 168 hours. Inpatient medications:  allopurinol   50 mg Oral q AM   amoxicillin   500 mg Oral Q12H   apixaban   2.5 mg Oral BID   budesonide -glycopyrrolate -formoterol   2 puff Inhalation BID   Chlorhexidine  Gluconate Cloth  6 each Topical Daily   divalproex   250 mg Oral Q12H   linagliptin   5 mg Oral Daily   multivitamin with minerals  1 tablet Oral Daily   OLANZapine   15 mg Oral QHS   pravastatin   40 mg Oral QPM   sodium bicarbonate   650 mg Oral TID    acetaminophen , ipratropium-albuterol , nitroGLYCERIN , oxyCODONE 

## 2024-02-10 ENCOUNTER — Other Ambulatory Visit (HOSPITAL_COMMUNITY): Payer: Self-pay

## 2024-02-10 ENCOUNTER — Telehealth (HOSPITAL_COMMUNITY): Payer: Self-pay

## 2024-02-10 DIAGNOSIS — Z960 Presence of urogenital implants: Secondary | ICD-10-CM | POA: Diagnosis not present

## 2024-02-10 DIAGNOSIS — I509 Heart failure, unspecified: Secondary | ICD-10-CM | POA: Diagnosis not present

## 2024-02-10 LAB — COMPREHENSIVE METABOLIC PANEL WITH GFR
ALT: 7 U/L (ref 0–44)
AST: 13 U/L — ABNORMAL LOW (ref 15–41)
Albumin: 2.9 g/dL — ABNORMAL LOW (ref 3.5–5.0)
Alkaline Phosphatase: 53 U/L (ref 38–126)
Anion gap: 12 (ref 5–15)
BUN: 41 mg/dL — ABNORMAL HIGH (ref 8–23)
CO2: 21 mmol/L — ABNORMAL LOW (ref 22–32)
Calcium: 9.9 mg/dL (ref 8.9–10.3)
Chloride: 98 mmol/L (ref 98–111)
Creatinine, Ser: 3.98 mg/dL — ABNORMAL HIGH (ref 0.44–1.00)
GFR, Estimated: 11 mL/min — ABNORMAL LOW
Glucose, Bld: 173 mg/dL — ABNORMAL HIGH (ref 70–99)
Potassium: 4 mmol/L (ref 3.5–5.1)
Sodium: 131 mmol/L — ABNORMAL LOW (ref 135–145)
Total Bilirubin: <0.2 mg/dL (ref 0.0–1.2)
Total Protein: 5.6 g/dL — ABNORMAL LOW (ref 6.5–8.1)

## 2024-02-10 LAB — MAGNESIUM: Magnesium: 2.4 mg/dL (ref 1.7–2.4)

## 2024-02-10 MED ORDER — FUROSEMIDE 40 MG PO TABS
40.0000 mg | ORAL_TABLET | Freq: Every day | ORAL | Status: DC
  Administered 2024-02-11 – 2024-02-12 (×2): 40 mg via ORAL
  Filled 2024-02-10 (×2): qty 1

## 2024-02-10 MED ORDER — BISACODYL 10 MG RE SUPP
10.0000 mg | Freq: Once | RECTAL | Status: AC
  Administered 2024-02-10: 10 mg via RECTAL
  Filled 2024-02-10: qty 1

## 2024-02-10 NOTE — Progress Notes (Signed)
 [facility] NEPHROLOGY PROGRESS NOTE  Assessment/ Plan: Pt is a 79 y.o. yo female  w/ PMH significant for atrial fib, CHF, COPD, CAD, HTN, schizophrenia, diabetes type 2 who presented on 02/06/2024 complaining of generalized pain.   # Acute kidney injury on CKD 4 b/l cr 2.4-2.9: AKI was suspected due to CHF.  Treated with IV Lasix  with worsening serum creatinine level.  The patient has chronic Foley catheter.  No significant peripheral edema.  She is nonoliguric and creatinine level is downtrending.  She is a poor candidate for outpatient dialysis because of physical deconditioning/bedbound state.  Continue supportive care.  # BP/volume: Close to euvolemic, hard to assess due to body habitus.    # Hyponatremia, hypervolemic: Recommend to resume home dose of furosemide  40 mg from tomorrow.  #Anemia: Monitor hemoglobin.  # Acute CHF exacerbation: Close to euvolemic, plan to resume furosemide  40 mg from tomorrow.  Sign off, please call us  back with question.  Subjective: Seen and examined.  Urine output is documented around 850 cc.  No new event overnight.  No nausea, vomiting, chest pain or shortness of breath. Objective Vital signs in last 24 hours: Vitals:   02/09/24 2041 02/09/24 2318 02/10/24 0500 02/10/24 0804  BP: (!) 141/69 (!) 147/36 (!) 161/42   Pulse: 71     Resp: 19 20    Temp: 98.8 F (37.1 C) 99.1 F (37.3 C) 98.7 F (37.1 C)   TempSrc: Oral Oral Oral   SpO2: 97%   98%  Weight:   (!) 148 kg   Height:       Weight change: -0.1 kg  Intake/Output Summary (Last 24 hours) at 02/10/2024 0950 Last data filed at 02/09/2024 1641 Gross per 24 hour  Intake --  Output 850 ml  Net -850 ml       Labs: RENAL PANEL Recent Labs  Lab 02/06/24 0154 02/06/24 0211 02/07/24 0304 02/08/24 0243 02/08/24 1442 02/09/24 0259 02/10/24 0115  NA 137   < > 133* 134* 130* 131* 131*  K 3.8   < > 4.4 4.3 4.7 4.4 4.0  CL 103   < > 101 99 100 98 98  CO2 19*  --  17*  20* 17* 18* 21*  GLUCOSE 154*   < > 153* 162* 138* 190* 173*  BUN 26*   < > 29* 42* 42* 43* 41*  CREATININE 2.53*   < > 3.27* 4.34* 4.48* 4.31* 3.98*  CALCIUM  10.8*  --  10.2 9.6 9.7 9.7 9.9  MG 2.0  --  2.0 2.1  --  2.2 2.4  ALBUMIN  3.2*  --   --   --   --   --  2.9*   < > = values in this interval not displayed.    Liver Function Tests: Recent Labs  Lab 02/06/24 0154 02/10/24 0115  AST 21 13*  ALT 7 7  ALKPHOS 57 53  BILITOT 0.2 <0.2  PROT 6.5 5.6*  ALBUMIN  3.2* 2.9*   No results for input(s): LIPASE, AMYLASE in the last 168 hours. No results for input(s): AMMONIA in the last 168 hours. CBC: Recent Labs    11/24/23 1042 11/24/23 1100 11/25/23 0217 01/13/24 0345 01/14/24 0407 02/06/24 0154 02/06/24 0211 02/07/24 0304  HGB  --   --    < > 8.6* 9.1* 10.1* 10.2*  10.5* 9.9*  MCV  --   --    < > 96.9 97.0 98.5  --  100.3*  VITAMINB12  --  696  --   --   --   --   --   --   FOLATE  --  14.4  --   --   --   --   --   --   FERRITIN  --  32  --   --   --   --   --   --   TIBC  --  433  --   --   --   --   --   --   IRON  --  53  --   --   --   --   --   --   RETICCTPCT 3.4*  --   --   --   --   --   --   --    < > = values in this interval not displayed.    Cardiac Enzymes: No results for input(s): CKTOTAL, CKMB, CKMBINDEX, TROPONINI in the last 168 hours. CBG: No results for input(s): GLUCAP in the last 168 hours.  Iron Studies: No results for input(s): IRON, TIBC, TRANSFERRIN, FERRITIN in the last 72 hours. Studies/Results: No results found.  Medications: Infusions:   Scheduled Medications:  allopurinol   50 mg Oral q AM   amoxicillin   500 mg Oral Q12H   apixaban   2.5 mg Oral BID   budesonide -glycopyrrolate -formoterol   2 puff Inhalation BID   Chlorhexidine  Gluconate Cloth  6 each Topical Daily   divalproex   250 mg Oral Q12H   linagliptin   5 mg Oral Daily   multivitamin with minerals  1 tablet Oral Daily   OLANZapine   15 mg Oral QHS    polyethylene glycol  17 g Oral Daily   pravastatin   40 mg Oral QPM   senna  1 tablet Oral Daily   sodium bicarbonate   650 mg Oral TID    have reviewed scheduled and prn medications.  Physical Exam: General:NAD, comfortable Heart:RRR, s1s2 nl Lungs:clear b/l, no crackle Abdomen:soft,  non-distended Extremities:No edema Neurology: Alert, awake and following commands  Kierre Deines Prasad Rakan Soffer 02/10/2024,9:50 AM  LOS: 4 days

## 2024-02-10 NOTE — Assessment & Plan Note (Signed)
-   T2DM - continue home Linagliptin  5mg  daily - Strep bacteremia s/p infected PPM - previously on IV PCN-G (stop on 1/17), transition to Amoxicillin  5 mg PO BID for life long starting 01/31/24 per ID note on 01/30/24 - Gout - continue home Allopurinol  50mg  daily - A-fib - continue home Eliquis  2.5 mg BID, continuous cardiac monitoring  - Schizophrenia : Continue home Depakote  250 mg BID, Olanzapine  15 mg daily - HLD - continue home Pravastatin  40 mg daily  - Chronic back pain - Tylenol  650 q6hrs PRN, oxycodone  5mg  q12hrs PRN - CKD - continue sodium bicarb 650mg  TID - HTN - holding home Amlodipine  10mg  daily 2/2 hypotension - COPD - Continue Breztri  BID, Duonebs q4hrs PRN - Continue home Nystatin  cream and powder PRN for fungal infection

## 2024-02-10 NOTE — Evaluation (Signed)
 Occupational Therapy Evaluation and Discharge Summary Patient Details Name: Abigail Wallace MRN: [id number] DOB: 01/15/1946 Today's Date: 02/10/2024   History of Present Illness   79 y.o. female presents to [facility] with worsening SOB, chest pain, and pelvic pain. Admitted with CHF exacerbation and AKI. +UTI resolved. PMHx: SSS s/p PPM on long term ABX for infected pacemaker , A-fib on home eliquis , gout, CKD Stage4, Schizophrenia, COPD, OSA, HTN, Tardive Dyskinesia, meningioma, HFpEF, Chronic foley, and DMII     Clinical Impressions Pt admitted with the above diagnosis and has the deficits outlined below. Pt is currently at her baseline with adls which is dependent in all adls except feeding. Pt has a caregiver that comes daily and her son is there to assist when he is not working. Pt is only alone for an hour or so a day if someone has to go to the store. Pt was having some trouble feeding self. Feeding was evaluated and recs given.  Pt otherwise is at baseline and does not need further skilled OT services.       If plan is discharge home, recommend the following:   Two people to help with walking and/or transfers;A lot of help with bathing/dressing/bathroom;Assistance with cooking/housework;Assistance with feeding;Direct supervision/assist for medications management;Direct supervision/assist for financial management;Assist for transportation;Help with stairs or ramp for entrance;Supervision due to cognitive status     Functional Status Assessment   Patient has not had a recent decline in their functional status     Equipment Recommendations   None recommended by OT     Recommendations for Other Services         Precautions/Restrictions   Precautions Precautions: Fall Recall of Precautions/Restrictions: Impaired Restrictions Weight Bearing Restrictions Per Provider Order: No     Mobility Bed Mobility Overal bed mobility: Needs Assistance Bed Mobility: Rolling,  Sit to Supine, Sidelying to Sit Rolling: Total assist, +2 for safety/equipment, +2 for physical assistance Sidelying to sit: Total assist, +2 for physical assistance, +2 for safety/equipment   Sit to supine: Total assist, +2 for physical assistance, +2 for safety/equipment   General bed mobility comments: TotalAx2 for all mobility with use of bed pad    Transfers                   General transfer comment: deferred, hoyer lift at baseline      Balance Overall balance assessment: Needs assistance, Mild deficits observed, not formally tested Sitting-balance support: Bilateral upper extremity supported, Feet unsupported Sitting balance-Leahy Scale: Poor Sitting balance - Comments: Fluctuates between MaxA and CGA for R lateral lean Postural control: Right lateral lean     Standing balance comment: unable to stand                           ADL either performed or assessed with clinical judgement   ADL Overall ADL's : At baseline                                       General ADL Comments: Pt is at baseline with all adls. Pt is dependent on caregiver or son for all adls except feeding. Pt feeds self most of time but not always; depends on the day. Evaluated feeding abilities. Encouaged pt to feed self and talked to nursing about positoining for feeding to ideally be able to feed self.  Vision Baseline Vision/History: 1 Wears glasses Ability to See in Adequate Light: 0 Adequate Patient Visual Report: No change from baseline Vision Assessment?: No apparent visual deficits     Perception Perception: Not tested       Praxis Praxis: Not tested       Pertinent Vitals/Pain Pain Assessment Pain Assessment: Faces Faces Pain Scale: Hurts even more Pain Location: r hip and back Pain Descriptors / Indicators: Aching, Discomfort Pain Intervention(s): Limited activity within patient's tolerance, Monitored during session, Premedicated before  session, Repositioned     Extremity/Trunk Assessment Upper Extremity Assessment Upper Extremity Assessment: RUE deficits/detail;LUE deficits/detail RUE Deficits / Details: PROM limited in shoulder due to pain; otherwise WFL. Strength: shoulders 2/5, elbows 2/5, hand 3+/5 RUE: Unable to fully assess due to pain RUE Sensation: WNL RUE Coordination: decreased fine motor;decreased gross motor LUE Deficits / Details: PROM limited in shoulder due to pain. Strength: shoulder 2/5, elbow 2/5, hand 3+/5 LUE: Unable to fully assess due to pain LUE Sensation: WNL LUE Coordination: decreased fine motor;decreased gross motor   Lower Extremity Assessment Lower Extremity Assessment: Defer to PT evaluation   Cervical / Trunk Assessment Cervical / Trunk Assessment: Other exceptions Cervical / Trunk Exceptions: increased body habitus   Communication Communication Communication: Impaired Factors Affecting Communication: Reduced clarity of speech   Cognition Arousal: Alert Behavior During Therapy: Anxious Cognition: History of cognitive impairments             OT - Cognition Comments: pt at baseline with cognition per family                 Following commands: Impaired Following commands impaired: Follows one step commands inconsistently     Cueing  General Comments   Cueing Techniques: Verbal cues;Tactile cues;Visual cues  Pt appears to be at baseline with all adls.   Exercises     Shoulder Instructions      Home Living Family/patient expects to be discharged to:: Private residence Living Arrangements: Children Available Help at Discharge: Family;Available 24 hours/day;Personal care attendant Type of Home: Apartment Home Access: Level entry     Home Layout: One level     Bathroom Shower/Tub: Chief Strategy Officer: Standard Bathroom Accessibility: Yes   Home Equipment: Shower seat;BSC/3in1;Tub bench;Rollator (4 wheels);Hospital bed;Wheelchair -  Careers Adviser (comment)   Additional Comments: hoyer lift, PCA Mon - Fri from 7a- 3 pm and 3pm - 7 pm.      Prior Functioning/Environment Prior Level of Function : Needs assist  Cognitive Assist : Mobility (cognitive);ADLs (cognitive) Mobility (Cognitive): Set up cues ADLs (Cognitive): Set up cues Physical Assist : Mobility (physical);ADLs (physical) Mobility (physical): Bed mobility;Transfers;Gait;Stairs ADLs (physical): Feeding;Grooming;Bathing;Dressing;IADLs;Toileting Mobility Comments: primarily stays in the  bed with use of hoyer lift when getting to [facility] ADLs Comments: son/PCA assist with all aspects of AD's, son gets her in W/C via hoyer to wash her hair; pt able to feed self at times but when she cant her caregiver or son does    OT Problem List: Decreased strength;Decreased range of motion;Decreased activity tolerance;Impaired balance (sitting and/or standing);Decreased coordination;Decreased cognition;Decreased safety awareness;Decreased knowledge of use of DME or AE;Decreased knowledge of precautions;Cardiopulmonary status limiting activity;Obesity;Impaired UE functional use;Pain;Increased edema   OT Treatment/Interventions:        OT Goals(Current goals can be found in the care plan section)   Acute Rehab OT Goals Patient Stated Goal: to get home when im better OT Goal Formulation: All assessment and education complete, DC therapy   OT  Frequency:       Co-evaluation              AM-PAC OT 6 Clicks Daily Activity     Outcome Measure Help from another person eating meals?: A Lot Help from another person taking care of personal grooming?: A Lot Help from another person toileting, which includes using toliet, bedpan, or urinal?: Total Help from another person bathing (including washing, rinsing, drying)?: Total Help from another person to put on and taking off regular upper body clothing?: Total Help from another person to put on and taking off regular lower body  clothing?: Total 6 Click Score: 8   End of Session Nurse Communication: Other (comment) (no OT needs)  Activity Tolerance: Patient tolerated treatment well Patient left: in bed;with call bell/phone within reach;with bed alarm set;with nursing/sitter in room  OT Visit Diagnosis: Feeding difficulties (R63.3)                Time: 8971-8961 OT Time Calculation (min): 10 min Charges:  OT General Charges $OT Visit: 1 Visit OT Evaluation $OT Eval Low Complexity: 1 Low  Joshua Silvano Dragon 02/10/2024, 10:47 AM

## 2024-02-10 NOTE — TOC Initial Note (Signed)
 Transition of Care [facility]) - Initial/Assessment Note    Patient Details  Name: Abigail Wallace MRN: [id number] Date of Birth: Mar 22, 1945  Transition of Care [facility]) CM/SW Contact:    Waddell Barnie Rama, RN Phone Number: 02/10/2024, 10:45 AM  Clinical Narrative:                  Per son, [name], From home with son, has PCP and insurance on file, he was unaware of [name] referring patient to [facility] due to them not having staffing, but he is ok with [facility] for now, she has hosp bed, hoyer lift, home oxygen  2 liters, nebulizer machine  at home.  She will need [employer] at [employer] per son, he prefers [employer] over [employer].  Son is support system,  Pta self ambulatory.  NCM spoke with Burnard with [facility] regarding referral from [facility] , she states yes they took over referral from them due to their staffing.  Soc will begin 24 to 48 hrs post dc.    Expected Discharge Plan: Home w Home Health Services Barriers to Discharge: Continued Medical Work up   Patient Goals and CMS Choice Patient states their goals for this hospitalization and ongoing recovery are:: return home with son CMS Medicare.gov Compare Post Acute Care list provided to:: Patient Represenative (must comment) (allowed to speak with son) Choice offered to / list presented to : Adult Children [place] ownership interest in [facility].provided to:: Patient    Expected Discharge Plan and Services In-house Referral: NA Discharge Planning Services: CM Consult Post Acute Care Choice: Home Health Living arrangements for the past 2 months: Single Family Home                 DME Arranged: N/A DME Agency: NA       HH Arranged: RN, PT HH Agency: [facility] Date HH Agency Contacted: 02/10/24 Time HH Agency Contacted: 1041 Representative spoke with at [facility] Agency: Burnard  Prior Living Arrangements/Services Living arrangements for the past 2 months: Single Family Home Lives with:: Adult  Children (son) Patient language and need for interpreter reviewed:: Yes Do you feel safe going back to the place where you live?: Yes      Need for Family Participation in Patient Care: Yes (Comment) Care giver support system in place?: Yes (comment) Current home services: DME (hosp bed, hoyer lift, home oxygen  2 liters, nebulizer machine) Criminal Activity/Legal Involvement Pertinent to Current Situation/Hospitalization: No - Comment as needed  Activities of Daily Living   ADL Screening (condition at time of admission) Independently performs ADLs?: No Does the patient have a NEW difficulty with bathing/dressing/toileting/self-feeding that is expected to last >3 days?: No Does the patient have a NEW difficulty with getting in/out of bed, walking, or climbing stairs that is expected to last >3 days?: No Does the patient have a NEW difficulty with communication that is expected to last >3 days?: No Is the patient deaf or have difficulty hearing?: No Does the patient have difficulty seeing, even when wearing glasses/contacts?: No Does the patient have difficulty concentrating, remembering, or making decisions?: Yes  Permission Sought/Granted Permission sought to share information with : Case Manager Permission granted to share information with : Yes, Verbal Permission Granted  Share Information with NAME: [name],Ronald  Permission granted to share info w AGENCY: HH  Permission granted to share info w Relationship: son  Permission granted to share info w Contact Information: [phone]  Emotional Assessment Appearance:: Appears stated age Attitude/Demeanor/Rapport: Engaged  Affect (typically observed): Stable Orientation: : Oriented to Situation, Oriented to  Time, Oriented to Place, Oriented to Self Alcohol  / Substance Use: Not Applicable Psych Involvement: No (comment)  Admission diagnosis:  CHF exacerbation (HCC) [I50.9] Urinary tract infection without hematuria, site unspecified  [N39.0] Acute on chronic congestive heart failure, unspecified heart failure type (HCC) [I50.9] Patient Active Problem List   Diagnosis Date Noted   PICC (peripherally inserted central catheter) in place 01/30/2024   Medication monitoring encounter 01/30/2024   Infection due to Streptococcus mitis group 01/30/2024   Generalized weakness 01/14/2024   Stage 4 chronic kidney disease (HCC) 01/13/2024   Dehydration 01/12/2024   Acute on chronic systolic heart failure (HCC) 01/11/2024   Fatigue 01/11/2024   RSV infection 12/31/2023   Acute hypoxic respiratory failure (HCC) 12/30/2023   Frequent loose stools 12/24/2023   Bacteremia 12/22/2023   Hallucinations 12/22/2023   Subacute bacterial endocarditis 12/22/2023   Infected pacemaker 12/22/2023   Chronic nausea 12/22/2023   Altered mental status 12/20/2023   Acute on chronic diastolic heart failure (HCC) 12/20/2023   Chronic kidney disease (CKD), stage 4 (HCC) 12/20/2023   Dizziness 12/20/2023   Lumbar pain 11/25/2023   Indwelling catheter present on admission 11/24/2023   Urinary tract infection associated with indwelling urethral catheter 09/26/2023   Heart failure with preserved ejection fraction (HCC) 08/09/2023   Chronic health problem 06/14/2023   Acute on chronic heart failure (HCC) 06/14/2023   AKI (acute kidney injury) on CKD 4 02/11/2023   T2DM (type 2 diabetes mellitus) (HCC) 02/08/2023   Meningioma (HCC) 04/06/2022   CHF exacerbation (HCC) 02/27/2022   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   Cholesteatoma 12/24/2021   Advanced care planning/counseling discussion 09/29/2021   COPD exacerbation (HCC) 03/15/2021   Chronic back pain 11/14/2018   Pulmonary nodules 03/03/2018   Paroxysmal atrial fibrillation (HCC) s/p cardioversion  01/28/2018   Dependence on wheelchair 05/17/2017   Abdominal aortic ectasia 01/16/2017   Renal cyst 01/16/2017   CKD stage IIIb 09/12/2016   Dyspnea with Chronic COPD 04/15/2015   Well  controlled type 2 diabetes mellitus (HCC) 03/20/2012   Gout 09/26/2011   UTI 06/21/2011   Hypertension, isolated systolic 04/26/2011   Tardive dyskinesia 04/26/2011   Artificial cardiac pacemaker 03/30/2010   Vaginal prolapse 03/27/2010   Obstructive sleep apnea 03/27/2010   Osteoarthritis of right knee 03/27/2010   Resting tremor 12/15/2009   Sick sinus syndrome (HCC) 04/24/2006   Schizophrenia (HCC) 03/14/2006   Chronic obstructive pulmonary disease (HCC) 03/14/2006   PCP:  Delores Suzann HERO, MD Pharmacy:   [facility] [place], [place] - [address] [facility] [address] [place] [address] Phone: [phone] Fax: [phone]  [name] [address] [place] [address] Phone: [phone] Fax: [phone]     Social Drivers of Health (SDOH) Social History: SDOH Screenings   Food Insecurity: No Food Insecurity (02/06/2024)  Housing: Low Risk (02/06/2024)  Transportation Needs: No Transportation Needs (02/06/2024)  Utilities: Not At Risk (02/06/2024)  Depression (PHQ2-9): High Risk (09/30/2023)  Financial Resource Strain: Low Risk (03/23/2022)  Social Connections: Socially Isolated (02/06/2024)  Tobacco Use: Medium Risk (02/06/2024)   SDOH Interventions: Social Connections Interventions: Inpatient TOC, Other (Comment) (Patient is from home with son)   Readmission Risk Interventions    02/10/2024   10:30 AM 01/02/2024    1:34 PM 12/20/2023    2:55 PM  Readmission Risk Prevention Plan  Transportation Screening Complete Complete  Complete  Medication Review Oceanographer) Complete Complete Complete  PCP or Specialist appointment within 3-5 days of discharge Complete  Complete  HRI or Home Care Consult Complete Complete Complete  Palliative Care Screening Not Applicable Not Applicable Not Applicable  Skilled Nursing Facility Not Applicable Not Applicable

## 2024-02-10 NOTE — Assessment & Plan Note (Addendum)
 Total output -3.7L since admit, weight down about 4kg since admit. BP stable.  - Appreciate nephrology recommendations as below for AKI - Strict I&Os, daily weight - Fall precautions - Appreciate PT/OT - AM BMP, Mg

## 2024-02-10 NOTE — Telephone Encounter (Signed)

## 2024-02-10 NOTE — Progress Notes (Addendum)
" ° ° ° °  Daily Progress Note Intern Pager: [phone]  Patient name: Abigail Wallace [facility] Medical record number: [id number] Date of birth: April 24, 1945 Age: 79 y.o. Gender: female  Primary Care Provider: Delores Suzann HERO, MD Consultants: nephrology Code Status: full code  Pt Overview and Major Events to Date:  1/22 admitted  Medical Decision Making:  [facility] is a 79 y.o. female admitted for CHF exacerbation now with AKI on CKD4. Pertinent PMH/PSH includes T2DM, strep bacteremia s/p infected PPM, gout, A-fib, schizophrenia, HLD, CAD .  Assessment & Plan CHF exacerbation (HCC) Total output -3.7L since admit, weight down about 4kg since admit. BP stable.  - Appreciate nephrology recommendations as below for AKI - Strict I&Os, daily weight - Fall precautions - Appreciate PT/OT - AM BMP, Mg AKI (acute kidney injury) on CKD 4 Slightly improved after albumin  x1 yesterday. Cr 3.98 from 4.31. Baseline closer to 2.4-2.9. - Nephrology consulted, appreciate recommendations - Avoid nephrotoxic agents, encourage hydration - AM BMP Chronic health problem - T2DM - continue home Linagliptin  5mg  daily - Strep bacteremia s/p infected PPM - previously on IV PCN-G (stop on 1/17), transition to Amoxicillin  5 mg PO BID for life long starting 01/31/24 per ID note on 01/30/24 - Gout - continue home Allopurinol  50mg  daily - A-fib - continue home Eliquis  2.5 mg BID, continuous cardiac monitoring  - Schizophrenia : Continue home Depakote  250 mg BID, Olanzapine  15 mg daily - HLD - continue home Pravastatin  40 mg daily  - Chronic back pain - Tylenol  650 q6hrs PRN, oxycodone  5mg  q12hrs PRN - CKD - continue sodium bicarb 650mg  TID - HTN - holding home Amlodipine  10mg  daily 2/2 hypotension - COPD - Continue Breztri  BID, Duonebs q4hrs PRN - Continue home Nystatin  cream and powder PRN for fungal infection  10:05 AM Spoke with son [name] to provide update on plan of care  FEN/GI: carb modified PPx:  eliquis  Dispo:pending clinical improvement   Subjective:  NAEON reports breathing feels fine today  Objective: Temp:  [97.9 F (36.6 C)-99.5 F (37.5 C)] 98.7 F (37.1 C) (01/26 0500) Pulse Rate:  [71-78] 71 (01/25 2041) Resp:  [19-20] 20 (01/25 2318) BP: (115-164)/(36-69) 161/42 (01/26 0500) SpO2:  [97 %-98 %] 97 % (01/25 2041) FiO2 (%):  [28 %] 28 % (01/25 2016) Weight:  [148 kg] 148 kg (01/26 0500) Physical Exam: General: NAD Cardiovascular: RRR Respiratory: CTAB upper lung fields Abdomen: soft NTND Extremities: trace BLE  Laboratory: Most recent CBC Lab Results  Component Value Date   WBC 9.9 02/07/2024   HGB 9.9 (L) 02/07/2024   HCT 33.0 (L) 02/07/2024   MCV 100.3 (H) 02/07/2024   PLT 175 02/07/2024   Most recent BMP    Latest Ref Rng & Units 02/10/2024    1:15 AM  BMP  Glucose 70 - 99 mg/dL 826   BUN 8 - 23 mg/dL 41   Creatinine 9.55 - 1.00 mg/dL 6.01   Sodium 864 - 854 mmol/L 131   Potassium 3.5 - 5.1 mmol/L 4.0   Chloride 98 - 111 mmol/L 98   CO2 22 - 32 mmol/L 21   Calcium  8.9 - 10.3 mg/dL 9.9     Mg 2.4  Romelle Booty, MD 02/10/2024, 7:20 AM  PGY-3, [facility] FPTS Intern pager: [phone], text pages welcome Secure chat group [facility] Teaching Service  "

## 2024-02-10 NOTE — Plan of Care (Signed)
 Patient is bedbound, and typically wants to be on her left side. Patient was straightened to her back, but about 15 minutes or less later, wanted to be readjusted to be on her back, saying she was in a lot of pain on her back.    Problem: Skin Integrity: Goal: Risk for impaired skin integrity will decrease Outcome: Progressing

## 2024-02-10 NOTE — Assessment & Plan Note (Addendum)
 Slightly improved after albumin  x1 yesterday. Cr 3.98 from 4.31. Baseline closer to 2.4-2.9. - Nephrology consulted, appreciate recommendations - Avoid nephrotoxic agents, encourage hydration - AM BMP

## 2024-02-11 ENCOUNTER — Telehealth: Payer: Self-pay | Admitting: Family Medicine

## 2024-02-11 DIAGNOSIS — R030 Elevated blood-pressure reading, without diagnosis of hypertension: Secondary | ICD-10-CM | POA: Insufficient documentation

## 2024-02-11 DIAGNOSIS — Z7401 Bed confinement status: Secondary | ICD-10-CM

## 2024-02-11 DIAGNOSIS — N184 Chronic kidney disease, stage 4 (severe): Secondary | ICD-10-CM

## 2024-02-11 DIAGNOSIS — N179 Acute kidney failure, unspecified: Secondary | ICD-10-CM | POA: Diagnosis not present

## 2024-02-11 DIAGNOSIS — Z960 Presence of urogenital implants: Secondary | ICD-10-CM | POA: Diagnosis not present

## 2024-02-11 DIAGNOSIS — I5043 Acute on chronic combined systolic (congestive) and diastolic (congestive) heart failure: Secondary | ICD-10-CM | POA: Diagnosis not present

## 2024-02-11 DIAGNOSIS — N1832 Chronic kidney disease, stage 3b: Secondary | ICD-10-CM | POA: Diagnosis not present

## 2024-02-11 LAB — COMPREHENSIVE METABOLIC PANEL WITH GFR
ALT: 7 U/L (ref 0–44)
AST: 16 U/L (ref 15–41)
Albumin: 2.8 g/dL — ABNORMAL LOW (ref 3.5–5.0)
Alkaline Phosphatase: 60 U/L (ref 38–126)
Anion gap: 13 (ref 5–15)
BUN: 36 mg/dL — ABNORMAL HIGH (ref 8–23)
CO2: 21 mmol/L — ABNORMAL LOW (ref 22–32)
Calcium: 9.9 mg/dL (ref 8.9–10.3)
Chloride: 99 mmol/L (ref 98–111)
Creatinine, Ser: 3.51 mg/dL — ABNORMAL HIGH (ref 0.44–1.00)
GFR, Estimated: 12 mL/min — ABNORMAL LOW
Glucose, Bld: 180 mg/dL — ABNORMAL HIGH (ref 70–99)
Potassium: 4.1 mmol/L (ref 3.5–5.1)
Sodium: 132 mmol/L — ABNORMAL LOW (ref 135–145)
Total Bilirubin: <0.2 mg/dL (ref 0.0–1.2)
Total Protein: 5.7 g/dL — ABNORMAL LOW (ref 6.5–8.1)

## 2024-02-11 LAB — CULTURE, BLOOD (ROUTINE X 2)
Culture: NO GROWTH
Culture: NO GROWTH
Special Requests: ADEQUATE
Special Requests: ADEQUATE

## 2024-02-11 LAB — MAGNESIUM: Magnesium: 2.4 mg/dL (ref 1.7–2.4)

## 2024-02-11 NOTE — Assessment & Plan Note (Signed)
 She is diuresed well.  No difficulty breathing. - Strict I&Os, daily weight - Fall precautions - No morning labs indicated

## 2024-02-11 NOTE — Assessment & Plan Note (Signed)
 Creatinine continues to improve from 3.98 > 3.51. Baseline closer to 2.4-2.9. - Avoid nephrotoxic agents, encourage hydration - anticipate d/c tomorrow with planned PCP f/u on Friday 1/30 for repeat BMP

## 2024-02-11 NOTE — Discharge Summary (Deleted)
 "  Family Medicine Teaching [facility] Discharge Summary  Patient name: Abigail Wallace [facility] Medical record number: [id number] Date of birth: 1945/05/05 Age: 79 y.o. Gender: female Date of Admission: 02/06/2024  Date of Discharge: 02/11/24 Admitting Physician: [place] KATHEE Samuels, DO  Primary Care Provider: Delores Suzann HERO, MD Consultants: Nephrology  Indication for Hospitalization: CHF exacerbation  Brief Hospital Course:  Sloan Galentine [place] is a 79 y.o.female with a history of T2DM, strep bacteremia s/p infected PPM, gout, A-fib, schizophrenia, HLD, CKD4, COPD, HTN, HFpEF who was admitted to the [facility] Medicine Teaching Service at [facility] for CHF exacerbation. Her hospital course is detailed below:  CHF Exacerbation Initially presented with worsening SOB and chest pain.  On admission BNP 8279 and patient required 2 L of oxygen .  She diuresed well with IV Lasix  40 mg, though this was eventually held for rising creatinine which was improving by time of discharge.  Patient did continue to use oxygen  for comfort during admission. Weight at discharge 321lbs.  UTI Patient with chronic indwelling Foley, reportedly decreased urinary output and dysuria on arrival.  UA was not overly concerning but given symptoms and patient's history of Klebsiella on previous cultures she was started on cefepime .  Urine cultures ultimately negative and so antibiotics were discontinued.  Other chronic conditions were medically managed with home medications and formulary alternatives as necessary (T2DM, strep bacteremia s/p infected PPM, gout, A-fib, schizophrenia, HLD)  PCP Follow-up Recommendations if aligns with GOC: CT chest in 3 years f/u infrarenal abdominal aortic aneurysm. Left lower lobe pulmonary nodule - Recommend annual screening with low dose CT Chest.  Discharge Diagnoses/Problem List:  Principal Problem:   CHF exacerbation (HCC) Active Problems:   AKI (acute kidney injury) on CKD 4    Chronic health problem   Indwelling catheter present on admission  Disposition: home  Discharge Condition: Stable  Discharge Exam:  General: Chronically ill-appearing, lying in bed comfortably, no acute distress. HEENT: normocephalic, EOM grossly intact, nares patent, MMM. Cardio: RRR Pulm: CTAB.  No increased work of breathing. Abdominal: bowel sounds present, soft, non-tender, non-distended. Extremities: Trace edema bilateral LE.  Moves all extremities equally.  Significant Procedures: None  Significant Labs and Imaging:  Recent Labs  Lab 02/10/24 0115 02/11/24 0317  NA 131* 132*  K 4.0 4.1  CL 98 99  CO2 21* 21*  GLUCOSE 173* 180*  BUN 41* 36*  CREATININE 3.98* 3.51*  CALCIUM  9.9 9.9  MG 2.4 2.4  ALKPHOS 53 60  AST 13* 16  ALT 7 7  ALBUMIN  2.9* 2.8*   1/22 CT chest abdomen pelvis: IMPRESSION: 1. No acute or inflammatory process identified in the non-contrast chest, abdomen, or pelvis. 2. Stable infrarenal abdominal aortic aneurysm (3.8 cm); recommend ultrasound follow-up in 3 years. Aortic Aneurysm NOS (ICD10-I71.9). 3. Left lower lobe pulmonary nodule (10 mm) is chronic. Annual screening with low dose CT Chest is recommended. 4. Aortic Atherosclerosis (ICD10-I70.0).  Emphysema (ICD10-J43.9).  1/22 CXR: IMPRESSION: 1. Limited evaluation of the left lower hemithorax. 2. Within that constraint, no acute cardiopulmonary abnormality.  Results/Tests Pending at Time of Discharge: None  Discharge Medications:  Allergies as of 02/11/2024       Reactions   Abilify [aripiprazole] Anaphylaxis, Shortness Of Breath, Other (See Comments)   Tremors, also   Keflex  [cephalexin ] Swelling, Other (See Comments)   Tongue swelling 12/2023 tolerated ceftriaxone  with no reported issues   Versacloz  [clozapine ] Anaphylaxis   Ativan  [lorazepam ] Other (See Comments)   Unknown reaction  Benadryl [diphenhydramine Hcl] Other (See Comments)   Vision changes   Benztropine  Other  (See Comments)   Mobility issues   Latuda [lurasidone Hcl] Other (See Comments)   Tremors   Remeron [mirtazapine] Other (See Comments)   Tremors   Haldol  [haloperidol  Lactate] Other (See Comments)   Tardive dyskinesias  Tremors Somnolence Speech changes Sialorrhea    Codeine Other (See Comments)   Unknown reaction   Latex Rash        Medication List     PAUSE taking these medications    amLODipine  10 MG tablet Wait to take this until your doctor or other care provider tells you to start again. Commonly known as: NORVASC  Take 1 tablet (10 mg total) by mouth at bedtime.   metoprolol  tartrate 100 MG tablet Wait to take this until your doctor or other care provider tells you to start again. Commonly known as: LOPRESSOR  Take 1 tablet (100 mg total) by mouth 3 (three) times daily.       STOP taking these medications    polycarbophil 625 MG tablet Commonly known as: FIBERCON       TAKE these medications    Accu-Chek Guide Test test strip Generic drug: glucose blood Use to check blood glucose once daily and as needed for symptoms   acetaminophen  500 MG tablet Commonly known as: TYLENOL  Take 500-1,000 mg by mouth every 6 (six) hours as needed for mild pain (pain score 1-3) or headache.   albuterol  108 (90 Base) MCG/ACT inhaler Commonly known as: VENTOLIN  HFA Inhale 2 puffs into the lungs every 6 (six) hours as needed for wheezing or shortness of breath.   allopurinol  100 MG tablet Commonly known as: ZYLOPRIM  Take 0.5 tablets (50 mg total) by mouth in the morning.   amoxicillin  500 MG capsule Commonly known as: AMOXIL  Take 1 capsule (500 mg total) by mouth in the morning and at bedtime.   apixaban  2.5 MG Tabs tablet Commonly known as: ELIQUIS  Take 1 tablet (2.5 mg total) by mouth 2 (two) times daily.   cholecalciferol  25 MCG (1000 UNIT) tablet Commonly known as: VITAMIN D3 Take 1 tablet (1,000 Units total) by mouth daily.   DESITIN EX Apply 1  Application topically daily as needed (rash).   diclofenac  Sodium 1 % Gel Commonly known as: Voltaren  Apply 2 g topically 4 (four) times daily as needed (pain).   divalproex  250 MG DR tablet Commonly known as: DEPAKOTE  Take 1 tablet (250 mg total) by mouth every 12 (twelve) hours.   freestyle lancets Use to check blood glucose once daily and as needed for symptoms   furosemide  40 MG tablet Commonly known as: Lasix  Take 1 tablet (40 mg total) by mouth daily.   ipratropium-albuterol  0.5-2.5 (3) MG/3ML Soln Commonly known as: DUONEB Take 3 mLs by nebulization every 4 (four) hours as needed. What changed: reasons to take this   loperamide  2 MG capsule Commonly known as: IMODIUM  Take 1 capsule (2 mg total) by mouth as needed for diarrhea or loose stools.   Multivitamin Women 50+ Tabs Take 1 tablet by mouth daily with breakfast.   nitroGLYCERIN  0.4 MG SL tablet Commonly known as: NITROSTAT  Place 1 tablet (0.4 mg total) under the tongue every 5 (five) minutes as needed for chest pain.   nystatin  powder Commonly known as: nystatin  Apply 1 Application topically daily. Apply after bathing. What changed:  when to take this reasons to take this   nystatin  cream Commonly known as: MYCOSTATIN  Apply 1 Application topically  daily as needed (Rash). What changed: reasons to take this   OLANZapine  10 MG tablet Commonly known as: ZYPREXA  Take 15 mg by mouth at bedtime.   OVER THE COUNTER MEDICATION Apply 1 application  topically See admin instructions. Caldesene Medicated Protecting Powder- Apply under the breasts and between the skin folds 2 times a day as needed for irritation   oxyCODONE  5 MG immediate release tablet Commonly known as: Roxicodone  Take 1 tablet (5 mg total) by mouth every 12 (twelve) hours as needed for severe pain (pain score 7-10).   pravastatin  40 MG tablet Commonly known as: PRAVACHOL  TAKE 1 TABLET (40 MG TOTAL) BY MOUTH EVERY EVENING.   sodium  bicarbonate 650 MG tablet Take 1 tablet (650 mg total) by mouth 3 (three) times daily.   Tradjenta  5 MG Tabs tablet Generic drug: linagliptin  Take 1 tablet (5 mg total) by mouth daily.   Trelegy Ellipta  100-62.5-25 MCG/ACT Aepb Generic drug: Fluticasone -Umeclidin-Vilant Inhale 1 puff into the lungs daily.   triamcinolone  ointment 0.5 % Commonly known as: KENALOG  Apply 1 Application topically 2 (two) times daily as needed (skin irritation).        Discharge Instructions: Please refer to Patient Instructions section of EMR for full details.  Patient was counseled important signs and symptoms that should prompt return to medical care, changes in medications, dietary instructions, activity restrictions, and follow up appointments.   Follow-Up Appointments:  Future Appointments  Date Time Provider Department Center  02/14/2024  8:50 AM Delores Suzann HERO, MD [facility]  03/05/2024  2:45 PM Dea Shiner, MD [facility]  03/30/2024  7:05 AM CVD HVT DEVICE REMOTES [facility]  06/29/2024  7:05 AM CVD HVT DEVICE REMOTES [facility] H[facility]  09/28/2024  7:05 AM CVD HVT DEVICE REMOTES [facility] H[facility]  12/28/2024  7:05 AM CVD HVT DEVICE REMOTES [facility] H&V     Contact information for follow-up providers     Delores Suzann HERO, MD. Call in 1 week(s).   Specialty: Family Medicine Why: please call to make follow up apt, office is closed today due to weather Contact information: [address] [place] [address] [phone]              Contact information for after-discharge care     Home Medical Care     [facility]) .   Service: Home Health Services Why: Agency will call you to set up apt times Contact information: [address] [place]  [address] [phone]                     Lafe Domino, DO 02/11/2024, 1:31 PM PGY-2, [employer] Family Medicine  "

## 2024-02-11 NOTE — TOC Transition Note (Signed)
 Transition of Care [facility]) - Discharge Note   Patient Details  Name: Abigail Wallace MRN: [id number] Date of Birth: [birth year]  Transition of Care [facility]) CM/SW Contact:  Waddell Barnie Rama, RN Phone Number: 02/11/2024, 3:27 PM   Clinical Narrative:    For dc today, she is set up with [facility], HHPT and they will do a BMP and CBC on 1/29 or 1/30 .  This is the request per PCP.  [facility] to transport patient at 4:30 today home.   Final next level of care: Home w Home Health Services Barriers to Discharge: Continued Medical Work up   Patient Goals and CMS Choice Patient states their goals for this hospitalization and ongoing recovery are:: return home with son CMS Medicare.gov Compare Post Acute Care list provided to:: Patient Represenative (must comment) (allowed to speak with son) Choice offered to / list presented to : Adult Children [place] ownership interest in [facility].provided to:: Patient    Discharge Placement                       Discharge Plan and Services Additional resources added to the After Visit Summary for   In-house Referral: NA Discharge Planning Services: CM Consult Post Acute Care Choice: Home Health          DME Arranged: N/A DME Agency: NA       HH Arranged: RN, PT HH Agency: [facility] Date HH Agency Contacted: 02/10/24 Time HH Agency Contacted: 1041 Representative spoke with at [facility] Agency: [facility]  Social Drivers of Health (SDOH) Interventions SDOH Screenings   Food Insecurity: No Food Insecurity (02/06/2024)  Housing: Low Risk (02/06/2024)  Transportation Needs: No Transportation Needs (02/06/2024)  Utilities: Not At Risk (02/06/2024)  Depression (PHQ2-9): High Risk (09/30/2023)  Financial Resource Strain: Low Risk (03/23/2022)  Social Connections: Socially Isolated (02/06/2024)  Tobacco Use: Medium Risk (02/06/2024)     Readmission Risk Interventions    02/10/2024   10:30 AM 01/02/2024     1:34 PM 12/20/2023    2:55 PM  Readmission Risk Prevention Plan  Transportation Screening Complete Complete Complete  Medication Review Oceanographer) Complete Complete Complete  PCP or Specialist appointment within 3-5 days of discharge Complete  Complete  HRI or Home Care Consult Complete Complete Complete  Palliative Care Screening Not Applicable Not Applicable Not Applicable  Skilled Nursing Facility Not Applicable Not Applicable

## 2024-02-11 NOTE — TOC Progression Note (Signed)
 Transition of Care [facility]) - Progression Note    Patient Details  Name: Abigail Wallace MRN: [id number] Date of Birth: [birth year]  [facility]) CM/SW Contact  Waddell Barnie Rama, RN Phone Number: 02/11/2024, 10:54 AM  Clinical Narrative:    Per son, [name] states he thinks patient will be dc today, if so he wants [facility] at 3 pm,  NCM asked MD if patient will be dc today. Awaiting response.    Expected Discharge Plan: Home w Home Health Services Barriers to Discharge: Continued Medical Work up               Expected Discharge Plan and Services In-house Referral: NA Discharge Planning Services: CM Consult Post Acute Care Choice: Home Health Living arrangements for the past 2 months: Single Family Home                 DME Arranged: N/A DME Agency: NA       HH Arranged: RN, PT HH Agency: [facility] Date HH Agency Contacted: 02/10/24 Time HH Agency Contacted: 1041 Representative spoke with at [facility] Agency: [facility]   Social Drivers of Health (SDOH) Interventions SDOH Screenings   Food Insecurity: No Food Insecurity (02/06/2024)  Housing: Low Risk (02/06/2024)  Transportation Needs: No Transportation Needs (02/06/2024)  Utilities: Not At Risk (02/06/2024)  Depression (PHQ2-9): High Risk (09/30/2023)  Financial Resource Strain: Low Risk (03/23/2022)  Social Connections: Socially Isolated (02/06/2024)  Tobacco Use: Medium Risk (02/06/2024)    Readmission Risk Interventions    02/10/2024   10:30 AM 01/02/2024    1:34 PM 12/20/2023    2:55 PM  Readmission Risk Prevention Plan  Transportation Screening Complete Complete Complete  Medication Review Oceanographer) Complete Complete Complete  PCP or Specialist appointment within 3-5 days of discharge Complete  Complete  HRI or Home Care Consult Complete Complete Complete  Palliative Care Screening Not Applicable Not Applicable Not Applicable  Skilled Nursing Facility Not Applicable  Not Applicable

## 2024-02-11 NOTE — TOC Progression Note (Signed)
 Transition of Care [facility]) - Progression Note    Patient Details  Name: Abigail Wallace MRN: [id number] Date of Birth: Nov 24, 1945  Transition of Care [facility]) CM/SW Contact  Waddell Barnie Rama, RN Phone Number: 02/11/2024, 4:56 PM  Clinical Narrative:    Discharged cancelled, bp elevated,  patient left bag on stretcher , NCM informed them , they will bring the bag back.  And the only time they had left for tomorrow was 7 pm pickup, so she is scheduled for 7 pm tomorrow.    Expected Discharge Plan: Home w Home Health Services Barriers to Discharge: Continued Medical Work up               Expected Discharge Plan and Services In-house Referral: NA Discharge Planning Services: CM Consult Post Acute Care Choice: Home Health Living arrangements for the past 2 months: Single Family Home Expected Discharge Date: 02/11/24               DME Arranged: N/A DME Agency: NA       HH Arranged: RN, PT HH Agency: [facility] Date HH Agency Contacted: 02/10/24 Time HH Agency Contacted: 1041 Representative spoke with at [facility] Agency: [facility]   Social Drivers of Health (SDOH) Interventions SDOH Screenings   Food Insecurity: No Food Insecurity (02/06/2024)  Housing: Low Risk (02/06/2024)  Transportation Needs: No Transportation Needs (02/06/2024)  Utilities: Not At Risk (02/06/2024)  Depression (PHQ2-9): High Risk (09/30/2023)  Financial Resource Strain: Low Risk (03/23/2022)  Social Connections: Socially Isolated (02/06/2024)  Tobacco Use: Medium Risk (02/06/2024)    Readmission Risk Interventions    02/10/2024   10:30 AM 01/02/2024    1:34 PM 12/20/2023    2:55 PM  Readmission Risk Prevention Plan  Transportation Screening Complete Complete Complete  Medication Review Oceanographer) Complete Complete Complete  PCP or Specialist appointment within 3-5 days of discharge Complete  Complete  HRI or Home Care Consult Complete Complete Complete  Palliative Care Screening  Not Applicable Not Applicable Not Applicable  Skilled Nursing Facility Not Applicable Not Applicable

## 2024-02-11 NOTE — Assessment & Plan Note (Signed)
 Patient has been hypotensive throughout hospital stay warranting holding home amlodipine  10 mg daily.  Today when transport arrived to take her home she did have elevated SBP to 190s which persisted on repeat.  Fortunately asymptomatic and without any acute changes.  Suspect this is most likely due to anxiety in anticipation of impending discharge home; spoke to son [name] phone and he agrees this is most likely the cause. - Continue to monitor overnight, and anticipate discharge home tomorrow - Will continue to hold hypertensive agents at this time; PCP to consider restarting as appropriate upon discharge/hospital follow up.

## 2024-02-11 NOTE — Assessment & Plan Note (Signed)
-   T2DM - continue home Linagliptin  5mg  daily - Strep bacteremia s/p infected PPM - previously on IV PCN-G (stop on 1/17), transition to Amoxicillin  5 mg PO BID for life long starting 01/31/24 per ID note on 01/30/24 - Gout - continue home Allopurinol  50mg  daily - A-fib - continue home Eliquis  2.5 mg BID, continuous cardiac monitoring  - Schizophrenia : Continue home Depakote  250 mg BID, Olanzapine  15 mg daily - HLD - continue home Pravastatin  40 mg daily  - Chronic back pain - Tylenol  650 q6hrs PRN, oxycodone  5mg  q12hrs PRN - CKD - continue sodium bicarb 650mg  TID - HTN - holding home Amlodipine  10mg  daily 2/2 hypotension - COPD - Continue Breztri  BID, Duonebs q4hrs PRN - Continue home Nystatin  cream and powder PRN for fungal infection

## 2024-02-11 NOTE — Telephone Encounter (Signed)
Called son and provided update.  

## 2024-02-11 NOTE — Discharge Summary (Incomplete)
 "  Family Medicine Teaching [facility] Discharge Summary  Patient name: Abigail Wallace [facility] Medical record number: [id number] Date of birth: 06/23/1945 Age: 79 y.o. Gender: female Date of Admission: 02/06/2024  Date of Discharge: 02/12/24 Admitting Physician: Houston KATHEE Samuels, DO  Primary Care Provider: Delores Suzann HERO, MD Consultants: Nephrology  Indication for Hospitalization: CHF exacerbation  Brief Hospital Course:  Abigail Wallace is a 79 y.o.female with a history of T2DM, strep bacteremia s/p infected PPM, gout, A-fib, schizophrenia, HLD, CKD4, COPD, HTN, HFpEF who was admitted to the [facility] Medicine Teaching Service at [facility] for CHF exacerbation. Her hospital course is detailed below:  CHF Exacerbation Initially presented with worsening SOB and chest pain.  On admission BNP 8279 and patient required 2 L of oxygen .  She diuresed well with IV Lasix  40 mg, though this was eventually held for rising creatinine which was improving by time of discharge.  Patient did continue to use oxygen  for comfort during admission. Resumed home lasix  dose upon discharge. Weight at discharge 321lbs.  UTI Patient with chronic indwelling Foley, reportedly decreased urinary output and dysuria on arrival.  UA was not overly concerning but given symptoms and patient's history of Klebsiella on previous cultures she was started on cefepime .  Urine cultures ultimately negative and so antibiotics were discontinued.  Other chronic conditions were medically managed with home medications and formulary alternatives as necessary (T2DM, strep bacteremia s/p infected PPM, gout, A-fib, schizophrenia, HLD)  PCP Follow-up Recommendations if aligns with GOC: CT chest in 3 years f/u infrarenal abdominal aortic aneurysm. Left lower lobe pulmonary nodule - Recommend annual screening with low dose CT Chest. Assess volume status, consider BMP at follow up  Discharge Diagnoses/Problem List:  Principal  Problem:   CHF exacerbation (HCC) Active Problems:   Bedbound   AKI (acute kidney injury) on CKD 4   Chronic health problem   Indwelling catheter present on admission   Elevated blood pressure reading  Disposition: home  Discharge Condition: Stable  Discharge Exam:  General: Chronically ill-appearing, lying in bed comfortably, no acute distress. HEENT: normocephalic, EOM grossly intact, nares patent, MMM. Cardio: RRR Pulm: CTAB.  No increased work of breathing. Abdominal: bowel sounds present, soft, non-tender, non-distended. Extremities: Trace edema bilateral LE.  Moves all extremities equally.  Significant Procedures: None  Significant Labs and Imaging:  Recent Labs  Lab 02/11/24 0317  NA 132*  K 4.1  CL 99  CO2 21*  GLUCOSE 180*  BUN 36*  CREATININE 3.51*  CALCIUM  9.9  MG 2.4  ALKPHOS 60  AST 16  ALT 7  ALBUMIN  2.8*   1/22 CT chest abdomen pelvis: IMPRESSION: 1. No acute or inflammatory process identified in the non-contrast chest, abdomen, or pelvis. 2. Stable infrarenal abdominal aortic aneurysm (3.8 cm); recommend ultrasound follow-up in 3 years. Aortic Aneurysm NOS (ICD10-I71.9). 3. Left lower lobe pulmonary nodule (10 mm) is chronic. Annual screening with low dose CT Chest is recommended. 4. Aortic Atherosclerosis (ICD10-I70.0).  Emphysema (ICD10-J43.9).  1/22 CXR: IMPRESSION: 1. Limited evaluation of the left lower hemithorax. 2. Within that constraint, no acute cardiopulmonary abnormality.  Results/Tests Pending at Time of Discharge: None  Discharge Medications:  Allergies as of 02/12/2024       Reactions   Abilify [aripiprazole] Anaphylaxis, Shortness Of Breath, Other (See Comments)   Tremors, also   Keflex  [cephalexin ] Swelling, Other (See Comments)   Tongue swelling 12/2023 tolerated ceftriaxone  with no reported issues   Versacloz  [clozapine ] Anaphylaxis   Ativan  [place] ] Other (  See Comments)   Unknown reaction   Benadryl  [diphenhydramine Hcl] Other (See Comments)   Vision changes   Benztropine  Other (See Comments)   Mobility issues   Latuda [lurasidone Hcl] Other (See Comments)   Tremors   Remeron [mirtazapine] Other (See Comments)   Tremors   Haldol  [haloperidol  Lactate] Other (See Comments)   Tardive dyskinesias  Tremors Somnolence Speech changes Sialorrhea    Codeine Other (See Comments)   Unknown reaction   Latex Rash     Med Rec must be completed prior to using this [facility]    Discharge Instructions: Please refer to Patient Instructions section of EMR for full details.  Patient was counseled important signs and symptoms that should prompt return to medical care, changes in medications, dietary instructions, activity restrictions, and follow up appointments.   Follow-Up Appointments:  Future Appointments  Date Time Provider Department Center  02/14/2024  8:50 AM Delores Suzann HERO, MD [facility]  03/05/2024  2:45 PM Dea Shiner, MD [facility]  03/30/2024  7:05 AM CVD HVT DEVICE REMOTES [facility] H&V  06/29/2024  7:05 AM CVD HVT DEVICE REMOTES [facility] H&V  09/28/2024  7:05 AM CVD HVT DEVICE REMOTES [facility] H&V  12/28/2024  7:05 AM CVD HVT DEVICE REMOTES [facility] H&V     Contact information for follow-up providers     Delores Suzann HERO, MD. Call in 1 week(s).   Specialty: Family Medicine Why: please call to make follow up apt, office is closed today due to weather Contact information: [address] [place] [address] [phone]              Contact information for after-discharge care     Home Medical Care     [facility] [place] [facility]) .   Service: Home Health Services Why: Agency will call you to set up apt times, please do BMP and CBC on 1/29 or 1/30 Contact information: [address] [place]  [address] [phone]                     Suzen Houston NOVAK, DO 02/12/2024, 4:25 PM PGY-1,  [facility]   FPTS Upper-Level Resident Addendum   I have discussed the above with Dr. Suzen and agree with the documented plan. My edits for correction/addition/clarification are included above. Please see any attending notes.   Payton Coward, MD PGY-3, [facility] 02/12/2024 4:59 PM  FPTS Service pager: [phone] (text pages welcome through [employer]ION) "

## 2024-02-11 NOTE — Care Management Important Message (Signed)
 Important Message  Patient Details  Name: Abigail Wallace MRN: [id number] Date of Birth: Apr 12, 1945   Important Message Given:  Yes - Medicare IM     Abigail Wallace 02/11/2024, 11:51 AM

## 2024-02-11 NOTE — Progress Notes (Signed)
 "    Daily Progress Note Intern Pager: [phone]  Patient name: Abigail Wallace [facility] Medical record number: [id number] Date of birth: 07/17/1945 Age: 79 y.o. Gender: female  Primary Care Provider: Delores Suzann HERO, MD Consultants: Nephrology Code Status: FULL   Pt Overview and Major Events to Date:  1/22 - admitted to FMTS   Medical Decision Making: [name] [facility] is a 79 y.o. female admitted for CHF exacerbation now with AKI on CKD4. Pertinent PMH/PSH includes T2DM, strep bacteremia s/p infected PPM, gout, A-fib, schizophrenia, HLD, CAD.  Stable today and hopeful for discharge home, however when transport arrived her blood pressure was somewhat elevated likely due to anticipation of discharge; deferring discharge to tomorrow. Assessment & Plan Elevated blood pressure reading Patient has been hypotensive throughout hospital stay warranting holding home amlodipine  10 mg daily.  Today when transport arrived to take her home she did have elevated SBP to 190s which persisted on repeat.  Fortunately asymptomatic and without any acute changes.  Suspect this is most likely due to anxiety in anticipation of impending discharge home; spoke to son [name] phone and he agrees this is most likely the cause. - Continue to monitor overnight, and anticipate discharge home tomorrow - Will continue to hold hypertensive agents at this time; PCP to consider restarting as appropriate upon discharge/hospital follow up. CHF exacerbation (HCC) She is diuresed well.  No difficulty breathing. - Strict I&Os, daily weight - Fall precautions - No morning labs indicated AKI (acute kidney injury) on CKD 4 Creatinine continues to improve from 3.98 > 3.51. Baseline closer to 2.4-2.9. - Avoid nephrotoxic agents, encourage hydration - anticipate d/c tomorrow with planned PCP f/u on Friday 1/30 for repeat BMP Chronic health problem - T2DM - continue home Linagliptin  5mg  daily - Strep bacteremia s/p infected PPM -  previously on IV PCN-G (stop on 1/17), transition to Amoxicillin  5 mg PO BID for life long starting 01/31/24 per ID note on 01/30/24 - Gout - continue home Allopurinol  50mg  daily - A-fib - continue home Eliquis  2.5 mg BID, continuous cardiac monitoring  - Schizophrenia : Continue home Depakote  250 mg BID, Olanzapine  15 mg daily - HLD - continue home Pravastatin  40 mg daily  - Chronic back pain - Tylenol  650 q6hrs PRN, oxycodone  5mg  q12hrs PRN - CKD - continue sodium bicarb 650mg  TID - HTN - holding home Amlodipine  10mg  daily 2/2 hypotension - COPD - Continue Breztri  BID, Duonebs q4hrs PRN - Continue home Nystatin  cream and powder PRN for fungal infection  FEN/GI: carb modified PPx: eliquis  Dispo: home tomorrow 1/28  Subjective:  No acute events overnight.  Patient seen this morning resting comfortably in bed.  She denies any pain or concerns at this time.  Objective: Temp:  [98 F (36.7 C)-98.3 F (36.8 C)] 98.1 F (36.7 C) (01/27 1126) Pulse Rate:  [60-74] 72 (01/27 1126) Resp:  [13-23] 13 (01/27 1126) BP: (137-154)/(35-118) 142/118 (01/27 1126) SpO2:  [97 %-100 %] 98 % (01/27 1126) Weight:  [145.8 kg] 145.8 kg (01/27 9364) Physical Exam: General: Resting in bed, NAD Cardiovascular: RRR Respiratory: CTAB in upper lung fields, no increased work of breathing. Abdomen: Bowel sounds present.  Soft, nontender. Extremities: Trace BLE edema.  Laboratory: Most recent CBC Lab Results  Component Value Date   WBC 9.9 02/07/2024   HGB 9.9 (L) 02/07/2024   HCT 33.0 (L) 02/07/2024   MCV 100.3 (H) 02/07/2024   PLT 175 02/07/2024   Most recent BMP    Latest Ref  Rng & Units 02/11/2024    3:17 AM  BMP  Glucose 70 - 99 mg/dL 819   BUN 8 - 23 mg/dL 36   Creatinine 9.55 - 1.00 mg/dL 6.48   Sodium 864 - 854 mmol/L 132   Potassium 3.5 - 5.1 mmol/L 4.1   Chloride 98 - 111 mmol/L 99   CO2 22 - 32 mmol/L 21   Calcium  8.9 - 10.3 mg/dL 9.9     Lafe Domino, DO 02/11/2024, 5:55  PM  PGY-2, [facility] FPTS Intern pager: [phone], text pages welcome Secure chat group [facility] Teaching Service   "

## 2024-02-11 NOTE — Discharge Instructions (Signed)
 Dear [name] [facility],   Thank you for letting us  participate in your care! In this section, you will find a brief hospital admission summary of why you were admitted to the hospital, what happened during your admission, your diagnosis/diagnoses, and recommended follow up.  You are admitted for a CHF (heart failure) exacerbation.  You were given medicines in the hospital to help get off some of the extra fluid in your body to help your heart work more effectively. It is very important that you continue to take all of your medicines as prescribed.   POST-HOSPITAL & CARE INSTRUCTIONS We recommend following up with your PCP within 1 week from being discharged from the hospital. Please let PCP/Specialists know of any changes in medications that were made which you will be able to see in the medications section of this packet.   DOCTOR'S APPOINTMENTS & FOLLOW UP Future Appointments  Date Time Provider Department Center  02/14/2024  8:50 AM Delores Suzann HERO, MD [facility]  03/05/2024  2:45 PM Dea Shiner, MD [facility]  03/30/2024  7:05 AM CVD HVT DEVICE REMOTES [facility]  06/29/2024  7:05 AM CVD HVT DEVICE REMOTES [facility] H[facility]  09/28/2024  7:05 AM CVD HVT DEVICE REMOTES [facility] H&V  12/28/2024  7:05 AM CVD HVT DEVICE REMOTES [facility] H&V     Thank you for choosing [facility]! Take care and be well!  Family Medicine Teaching Service Inpatient Team   [facility]  [address] [place], [place] [address] [phone]

## 2024-02-11 NOTE — Progress Notes (Signed)
 Called pt Son [name] Arenas to go over Discharge summary prior to pt returning home. Unable to reach son LVM to return call.

## 2024-02-11 NOTE — Telephone Encounter (Signed)
 Messaged nurse case manager at [facility] and was provided with contact number for [place] at [place].   Called Kelly at [phone]. She advised that patient is set up for visit on Friday, 1/30 with lab orders in place.   Nothing further needed from our office.   Chiquita BROCKS Josefita Weissmann

## 2024-02-11 NOTE — Telephone Encounter (Signed)
 RN Team Patient has changed to [facility] (transferred by [facility])  Please call and request a BMP and CBC on 1/29 or 1/30  Thank you Suzann Daring, MD  Family Medicine Teaching Service

## 2024-02-11 NOTE — Care Plan (Addendum)
 FMTS Brief Progress Note  S: Patient seen at bedside w Dr. Lupie for elevated BPs just prior to discharge/transport via [facility]. She is overall comfortable-appearing, denies pain or difficulty breathing. Mentation consistent with when I have seen her prior.  O: BP (!) 142/118 (BP Location: Right Arm)   Pulse 72   Temp 98.1 F (36.7 C) (Oral)   Resp 13   Ht 5' 6 (1.676 m)   Wt (!) 145.8 kg   SpO2 98%   BMI 51.88 kg/m   General: comfortable-appearing, no acute distress. HEENT: normocephalic, PERRLA, EOM grossly intact, MMM. Cardio: RRR Pulm: Slightly diminished air movement in bilateral bases most likely due to positioning in bed and body habitus. No wheezing or increased WOB. Abdominal: bowel sounds present, soft, non-tender, non-distended. No HSM. Extremities: Trace edema BLE. Neuro: Alert, speech normal in content, no facial asymmetry. Psych: Alert, communicative, answers questions and follows commands.  A/P: Elevated BP Pressures have actually been low during this hospitalization and home amlodipine  10 mg has been held. Her DBPs do run low and she has wide pulse pressure; I would be worried about adding BP agent at this time and worsening this. Fortunately on exam patient is well-appearing without apparent pain, neurologic deficit, or any other acute change. RN at bedside confirms BPs taken prior to attempting to move patient, so do not suspect this is contributing. She does desire to discharge home if able, however I do think it is reasonable to observe overnight and re attempt discharge tomorrow. She does have close follow up scheduled 1/30 with PCP, at which time changes to BP regimen may be considered. At this time discharge order canceled. Have attempted to call patient's son [name] with this update but have not been able to reach him. Will continue to try to contact.  If condition changes, plan includes reevaluation.    5:15PM Addendum: Spoke to son [name] and updated him on  the situation. He is appreciative of updates and understanding of the situation. At this time hopeful for discharge home via transport tomorrow.   Lafe Domino, DO 02/11/2024, 4:56 PM PGY-2, [facility] Please page [phone] with questions.

## 2024-02-11 NOTE — Telephone Encounter (Signed)
 Called [facility]. I was advised that patient would need new referral prior to verbal orders being given.   Will forward referral request to Dr. Delores.   [name], RN

## 2024-02-12 ENCOUNTER — Telehealth: Payer: Self-pay | Admitting: Family Medicine

## 2024-02-12 DIAGNOSIS — Z7401 Bed confinement status: Secondary | ICD-10-CM | POA: Diagnosis not present

## 2024-02-12 DIAGNOSIS — N1832 Chronic kidney disease, stage 3b: Secondary | ICD-10-CM

## 2024-02-12 DIAGNOSIS — Z960 Presence of urogenital implants: Secondary | ICD-10-CM | POA: Diagnosis not present

## 2024-02-12 DIAGNOSIS — I5043 Acute on chronic combined systolic (congestive) and diastolic (congestive) heart failure: Secondary | ICD-10-CM | POA: Diagnosis not present

## 2024-02-12 MED ORDER — BISACODYL 10 MG RE SUPP: 10.0000 mg | Freq: Once | RECTAL | Status: DC

## 2024-02-12 NOTE — TOC Transition Note (Signed)
 Transition of Care [facility]) - Discharge Note   Patient Details  Name: Abigail Wallace MRN: [id number] Date of Birth: 1945/11/01  Transition of Care [facility]) CM/SW Contact:  Waddell Barnie Rama, RN Phone Number: 02/12/2024, 2:51 PM   Clinical Narrative:    Per MD plan is for patient to dc today,  [facility] will transport her at 7 pm.    Final next level of care: Home w Home Health Services Barriers to Discharge: Continued Medical Work up   Patient Goals and CMS Choice Patient states their goals for this hospitalization and ongoing recovery are:: return home with son CMS Medicare.gov Compare Post Acute Care list provided to:: Patient Represenative (must comment) (allowed to speak with son) Choice offered to / list presented to : Adult Children [place] ownership interest in [facility].provided to:: Patient    Discharge Placement                       Discharge Plan and Services Additional resources added to the After Visit Summary for   In-house Referral: NA Discharge Planning Services: CM Consult Post Acute Care Choice: Home Health          DME Arranged: N/A DME Agency: NA       HH Arranged: RN, PT HH Agency: [facility] Date HH Agency Contacted: 02/10/24 Time HH Agency Contacted: 1041 Representative spoke with at [facility] Agency: Burnard  Social Drivers of Health (SDOH) Interventions SDOH Screenings   Food Insecurity: No Food Insecurity (02/06/2024)  Housing: Low Risk (02/06/2024)  Transportation Needs: No Transportation Needs (02/06/2024)  Utilities: Not At Risk (02/06/2024)  Depression (PHQ2-9): High Risk (09/30/2023)  Financial Resource Strain: Low Risk (03/23/2022)  Social Connections: Socially Isolated (02/06/2024)  Tobacco Use: Medium Risk (02/06/2024)     Readmission Risk Interventions    02/10/2024   10:30 AM 01/02/2024    1:34 PM 12/20/2023    2:55 PM  Readmission Risk Prevention Plan  Transportation Screening Complete Complete  Complete  Medication Review Oceanographer) Complete Complete Complete  PCP or Specialist appointment within 3-5 days of discharge Complete  Complete  HRI or Home Care Consult Complete Complete Complete  Palliative Care Screening Not Applicable Not Applicable Not Applicable  Skilled Nursing Facility Not Applicable Not Applicable

## 2024-02-12 NOTE — Telephone Encounter (Signed)
 After Hours Call  Got after hours call from son [name]. He expressed concern that her Norvasc  and metoprolol  were held despite having high blood pressure. I discussed that inpatient team was concerned about her blood pressure dropping too low and wanted her to be seen outpatient before restarting this medication. [name] expressed frustration that pt has transportation issues and can't be seen outpatient. He plans to call Dr. Delores tomorrow. Son denies medical emergency at this time, no other concerns at this time.

## 2024-02-12 NOTE — Plan of Care (Signed)
" °  Problem: Education: Goal: Knowledge of General Education information will improve Description: Including pain rating scale, medication(s)/side effects and non-pharmacologic comfort measures Outcome: Progressing   Problem: Clinical Measurements: Goal: Will remain free from infection Outcome: Progressing   Problem: Clinical Measurements: Goal: Diagnostic test results will improve Outcome: Progressing   Problem: Clinical Measurements: Goal: Respiratory complications will improve Outcome: Progressing   Problem: Elimination: Goal: Will not experience complications related to bowel motility Outcome: Progressing   Problem: Elimination: Goal: Will not experience complications related to urinary retention Outcome: Progressing   Problem: Safety: Goal: Ability to remain free from injury will improve Outcome: Progressing   Problem: Pain Managment: Goal: General experience of comfort will improve and/or be controlled Outcome: Progressing   Problem: Skin Integrity: Goal: Risk for impaired skin integrity will decrease Outcome: Progressing   "

## 2024-02-12 NOTE — Plan of Care (Signed)
" °  Problem: Health Behavior/Discharge Planning: Goal: Ability to manage health-related needs will improve Outcome: Not Progressing  Pt continues to be total care with ADLS and is non ambulatory "

## 2024-02-13 ENCOUNTER — Telehealth: Payer: Self-pay

## 2024-02-13 ENCOUNTER — Encounter: Payer: Self-pay | Admitting: Family Medicine

## 2024-02-13 DIAGNOSIS — R32 Unspecified urinary incontinence: Secondary | ICD-10-CM

## 2024-02-13 NOTE — Telephone Encounter (Signed)
 Community message sent to [employer] for processing.

## 2024-02-13 NOTE — Transitions of Care (Post Inpatient/ED Visit) (Addendum)
 Today's TOC FU Call Status: Today's TOC FU Call Status:: Successful TOC FU Call Completed (Per Son, [name], as informant for information documented on this call relating to patient status.) TOC FU Call Complete Date: 02/13/24  Patient's Name and Date of Birth confirmed. Name, DOB  Transition Care Management Follow-up Telephone Call Date of Discharge: 02/12/24 Discharge Facility: [name]) Type of Discharge: Inpatient Admission Primary Inpatient Discharge Diagnosis:: CHF exacerbation How have you been since you were released from the hospital?: Worse Any questions or concerns?: Yes Patient Questions/Concerns:: Son, [name], stated patient was sent home covered in feces and soaked in urine due to a leaking Foley that was not addressed and now home health agency [name] dropped patient from agency, [facility] accepted but it telling [name] that they do not do Foley care. Patient is 3125 lbs and son and aide cannot handle this at home to change Foley if needed when it should have been checked or replaced while at the hospital; it was replaced initally but leaked since it was put in, it was brought to their attention again on Tuesday prior to discharge on Wednesday but nothing was done Patient has contacted patient experience on last discharge due to concerns about care, but stated nothing is done. PCP Dr. Suzann Daring is making a home visit on 02/14/24 and RN CM has reached out and left a voice mail with Burnard at [facility] at [phone] requesitng a call back with number givern, to clarify transition of care to home health as was arranged per discharge note from Inpatient Case Mangement with start of service noted to be 02/14/2024 with labs drawn. Son requested follow up call but had to get off the call as was at work. PCS are at the home with patient. Patient Questions/Concerns Addressed: Other: (See above note: RN CM outreached Kelly at [employer] and left a message requested a call back to clarify  transition to home health going forward at [phone])  Items Reviewed: Did you receive and understand the discharge instructions provided?: Yes Medications obtained,verified, and reconciled?: Partial Review Completed Reason for Partial Mediation Review: Patient, son, as informant for call was at work and most of call was centered around the issues described above before he had to get off call and return to work. AVS list Pause of blood pressure medication unitl PCP HFU assessment and to stop FiberCon. Son stated those were the only changes. Will try to complete on follow up call requested regarding home health agency clarification. Any new allergies since your discharge?: No Dietary orders reviewed?: No Do you have support at home?: Yes People in Home [RPT]: child(ren), adult Name of Support/Comfort Primary Source: [name], Emergency Contact  [phone] [facility]) as informant and main caretaker of patient.  Medications Reviewed Today: Partial review of medication change on discharge due to other priority issue of son as caretaker of patient and while he was at work and could not complete call  Medications Reviewed Today   Medications were not reviewed in this encounter    Assessment: Chronic indwelling Foley replaced in hospital but son reports has been leaking since then and was not addressed, patient was sent home soaked in urine and foley continue to leak. [facility] was contacted. PCP HFU via home visit on 02/14/24 with start of service by [facility] confirmed on 02/14/24 with Foley care issues to be addressed and labs drawn per Burnard Bucks at [facility]. Burnard indicated she had been touch with PCP this am when RN CM  reached out and left a voice mail and PCP and home health agency will collaborate on care and address foley issues short term by [facility] and long term by PCP assistance per [employer]. Son stated in outreach call that his was priority issue for patient in  addition to concerns about paused medications and he has also been in contact with PCP office. Full assessment on follow up when son is not at work with full medication review. No red flags other than the foley issue reported by son on this call.   Home Care and Equipment/Supplies: Were Home Health Services Ordered?: Yes Name of Home Health Agency:: [facility] Per Burnard at [phone] Has Agency set up a time to come to your home?: Yes First Home Health Visit Date: 02/14/24 (Now under question if agency will start service, attempting to clarify, left a VM with Burnard requesting call back for clarification.) Any new equipment or medical supplies ordered?: No  Functional Questionnaire: Do you need assistance with bathing/showering or dressing?: Yes Do you need assistance with meal preparation?: Yes Do you need assistance with eating?: Yes Do you have difficulty maintaining continence: Yes Do you need assistance with getting out of bed/getting out of a chair/moving?: Yes (Bedbound) Do you have difficulty managing or taking your medications?: Yes  Follow up appointments reviewed: PCP Follow-up appointment confirmed?: Yes Date of PCP follow-up appointment?: 02/14/24 Follow-up Provider: Dr. Suzann [facility] will make a home visit. Specialist Hospital Follow-up appointment confirmed?: NA Do you need transportation to your follow-up appointment?: Yes (Uses Medical transportation.) Transportation Need Intervention Addressed By:: Transportation Arranged Do you understand care options if your condition(s) worsen?: Yes-patient verbalized understanding  02/13/24: Successful outreach post discharge with son, [name], as caretaker of patient and informant on this call.  Priority for son on this call was reported condition patient was transported home in on discharge. Son reports patient was transported  Soaked in urine from leaking Foley and covered in feces where they had not changed her, he reports  transport took her in this condition and brought her home in this condition.  He has reached out to Patient Experience before but expressed frustration that he received no feedback and feels nothing is ever done and that is they way it is at [facility].  Son also said long term home health agency [name] declined to resume care and [facility] was agency that accepted but he reports he called this am and the person he spoke to told him they do not do foley care when he expressed his concern that patient was laying in urine still due to leaking Foley.  Call was cut short due to son being at work, private aide at home with patient, but is unable to properly clean patient alone.  RN CM contacted [facility], Burnard Bucks, at [phone] to clarify situation and requested call back, Burnard did call back and stated she had been on the phone with Dr. Delores, PCP, and they would indeed collaborate on short term Foley care/management and with Dr. Delores for long term management with start of service on 02/14/24 with labs, and PCP HFU on a home visit as well on 02/14/24.  TOC RN will make a follow up call to son [name] to inform him of home health clarification and another follow up call when he is not at work to do a full follow up call with medication review, assessment, care plan if son agrees.  Son stated, patient was stable other than the [facility]  issues at the time of the call and has been caring for his mother a long time.    02/13/24: Chart Addendum at 1252 pm. Per follow up with son, Renay, to update on status of home health agency confirmed start of service on 02/14/24 and inquiry into what foley supplies he had on hand. He stated he had some but needed more and patient requires a 16Fr Foley or it will leak. Contacted Burnard Bucks at [facility] to pass this information on. Also asked if anyway someone could see patient today given the state of her leaking foley and staying wet in urine, but there  was no RN today with weather conditions past few days and it was a push to get someone out there on Friday, 02/14/24 as planned and relayed by RN CM to patient's son, [name] as caretaker and informant for the call. 1255 pm Addendum added.    Bing Edison MSN, RN RN Case Sales [facility]  [facility] Office Hours M-F [phone] Direct Dial: [phone] Main Phone [phone]  Fax: [phone] [email or web address]

## 2024-02-13 NOTE — Telephone Encounter (Signed)
 Confirmation received from [employer].  Messaged [name].

## 2024-02-13 NOTE — Telephone Encounter (Signed)
 [name] calls nurse line for several concerns.   (1) He reports her foley has been leaking since leaving the hospital. He is requesting a DME order for disposable mattress pads at this time until foley can be changed.   (2) He reports [facility] will not be able to provide foley changes at this time. He reports they will only be able to provide education and lab work. He reports he contacted [name] in hopes they would be willing to take her back on. He reports he has a phone call with their referral coordinator this morning.   He reports he will give me an update on [name] once he speaks with their coordinator.   He reports he has also reached out to her hospital case manager, Barnie Birmingham, for any extra assistance with home health.   (3) He reports she has been holding Metoprolol  and Norvasc  since discharge. He reports her BP this morning was 186/94. Please advise on restarting this medication.   Will forward to PCP.

## 2024-02-13 NOTE — Telephone Encounter (Signed)
 Patient has need for DME. I have ordered incontinence supplies. I am routing note for [facility] RN Pool.   Please advise they should restart metoprolol    Spoke [facility] and Case manager on 3E. Confirmed [facility] will indeed change foley--they will not do it long term. [facility] plans to get labs and change foley tomorrow. Will discuss options with son over coming months.    Suzann CHRISTELLA Daring, MD

## 2024-02-13 NOTE — Telephone Encounter (Signed)
 Abigail Wallace returns call to nurse line regarding continued concerns with catheter. Catheter was replaced during hospitalization last Friday.   He reports leaking since Tuesday. He states that there is no urine going in the bag. Continues to have output in bed pad. Home health RN coming out tomorrow to replace.   Denies fever, abdominal pain, change in mentation.   Abigail Wallace is asking if he should deflate balloon and try to adjust balloon in bladder. Advised that this is not recommended as this could also cause further issues.   We discussed ED precautions. He will monitor overnight.   He wanted PCP to be aware of catheter concerns.   [name], RN

## 2024-02-14 ENCOUNTER — Encounter: Payer: Self-pay | Admitting: Family Medicine

## 2024-02-14 ENCOUNTER — Other Ambulatory Visit (INDEPENDENT_AMBULATORY_CARE_PROVIDER_SITE_OTHER): Admitting: Family Medicine

## 2024-02-14 ENCOUNTER — Telehealth: Payer: Self-pay

## 2024-02-14 VITALS — BP 152/72 | HR 62

## 2024-02-14 DIAGNOSIS — I5032 Chronic diastolic (congestive) heart failure: Secondary | ICD-10-CM | POA: Diagnosis not present

## 2024-02-14 DIAGNOSIS — E1122 Type 2 diabetes mellitus with diabetic chronic kidney disease: Secondary | ICD-10-CM | POA: Diagnosis not present

## 2024-02-14 DIAGNOSIS — Z7401 Bed confinement status: Secondary | ICD-10-CM

## 2024-02-14 DIAGNOSIS — Z978 Presence of other specified devices: Secondary | ICD-10-CM | POA: Diagnosis not present

## 2024-02-14 DIAGNOSIS — M545 Low back pain, unspecified: Secondary | ICD-10-CM | POA: Diagnosis not present

## 2024-02-14 DIAGNOSIS — N184 Chronic kidney disease, stage 4 (severe): Secondary | ICD-10-CM

## 2024-02-14 DIAGNOSIS — R918 Other nonspecific abnormal finding of lung field: Secondary | ICD-10-CM | POA: Diagnosis not present

## 2024-02-14 DIAGNOSIS — G8929 Other chronic pain: Secondary | ICD-10-CM

## 2024-02-14 MED ORDER — SODIUM BICARBONATE 650 MG PO TABS: 650.0000 mg | ORAL_TABLET | Freq: Three times a day (TID) | ORAL | 3 refills | Status: AC

## 2024-02-14 MED ORDER — OXYCODONE HCL 5 MG PO TABS: 5.0000 mg | ORAL_TABLET | Freq: Two times a day (BID) | ORAL | 0 refills | Status: AC | PRN

## 2024-02-14 NOTE — Assessment & Plan Note (Addendum)
 A1C at end of February

## 2024-02-14 NOTE — Assessment & Plan Note (Addendum)
 Unable to transfer easily, only son can move her in home

## 2024-02-14 NOTE — Telephone Encounter (Signed)
 Abigail Wallace calls nurse line in regards to foley change.   He reports HH RN came in today and changed foley. He reports he was told the foley was never in the correct spot to begin with. He reports immediately of urine filled the bag.  He reports RN reported cloudy urine with a pungent odor. He reports he would like to increase her water  intake from to with PCP permission.   He reports he has been with her all day and reports vitals have been WNL and behaviors are at baseline.   Precautions discussed. Will forward to PCP for advisement.

## 2024-02-14 NOTE — Telephone Encounter (Signed)
 Called son. All questions answered.   Okay to increase fluid intake   Suzann Daring, MD  [facility] Medicine Teaching Service

## 2024-02-14 NOTE — Assessment & Plan Note (Addendum)
 Pain controlled with 5 mg oxycodone  per day Refill sent to pharmacy  PDMP reviewed Refill to pharmacy

## 2024-02-14 NOTE — Assessment & Plan Note (Addendum)
 To be replaced today Discussed Purewick Will send video This may reduce infections as compared to Foley  Has had UOP despite leaking Foley since Sunday 1/25

## 2024-02-14 NOTE — Assessment & Plan Note (Addendum)
 CT chest in 2026 showed stability, she has stopped smoking Discussed recommended repeat in 1 year--will address and complete pending clinical status

## 2024-02-14 NOTE — Telephone Encounter (Signed)
 Reviewed concerns at home visit

## 2024-02-14 NOTE — Progress Notes (Signed)
 "   SUBJECTIVE:   CHIEF COMPLAINT: home visit, hospital follow up   HPI:  The patient is seen at home as she is bedbound and can no longer transfer to a wheelchair.  Abigail Wallace [place] is a 79 y.o.  with history notable for atrial fibrillation, CKD IV, HFpEF, pacemaker insertion, and recent Strep bactermia presenting for follow up from the hospital.   The patient was recently admitted for an acute exacerbation of heart failure with preserved ejection fraction that is now resolved.  She was seen by nephrology during her stay to it due to a rise in her creatinine.  She was discharged on Lasix  40 mg daily.  Her metoprolol  and amlodipine  were held.  Her blood pressures been high the past few days.  She denies any symptoms today.  Her breathing is doing well.  She is back on room air with appropriate oxygenation.  No chest pain no vision changes.  She is asleep in her bed this morning but easily awakens.   Last few days vitals are reviewed.  Her morning sugars have been appropriate all below 155.  Mostly in the 130s.  One of the primary she is today is that her Foley catheter continues to leak.  The patient is very hesitant to use a pure wick.  She denies any significant pain.  She had multiple episodes of incontinence overnight.  She does need a few medication refills.  She is been taking 5 mg of oxycodone  for low back pain at night this has been working well and allows her to sleep comfortably.  The mattress overlay on her bed is very uncomfortable and the patient has a history of polio.  She does not become sedated or confused due to this.  PDMP is reviewed and appropriate.  The patient's mentation has started to wax and wane more over the past few months.  Today for example she did not know who I was when I first walked in the room.  PERTINENT  PMH / PSH/Family/Social History : Updated and reviewed as appropriate  OBJECTIVE:   BP (!) 152/72 (BP Location: Right Arm, Cuff Size: Large)    Pulse 62   SpO2 97%   Today's weight:  Review of prior weights: 93% on RA 97% on 2 L  Tired appearing but easily awakens Did fall asleep during exam Alert to person place and situation Irregular rate and rhythm Lungs clear bilaterally Abdomen soft with mild infraumbilical tenderness Foley with no output No edema JVP 3 cm  ASSESSMENT/PLAN:   Assessment & Plan Pulmonary nodules CT chest in 2026 showed stability, she has stopped smoking Discussed recommended repeat in 1 year--will address and complete pending clinical status  Type 2 diabetes mellitus with stage 4 chronic kidney disease, without long-term current use of insulin  (HCC) A1C at end of February  Bedbound Unable to transfer easily, only son can move her in home Chronic indwelling Foley catheter To be replaced today Discussed Purewick Will send video This may reduce infections as compared to Foley  Has had UOP despite leaking Foley since Sunday 1/25  Chronic heart failure with preserved ejection fraction (HFpEF) (HCC) Doing well Repeat BMP today Euvolemic on exam No edema or significant JVP Lasix  40 mg continued Restart metoprolol   Chronic right-sided low back pain without sciatica Pain controlled with 5 mg oxycodone  per day Refill sent to pharmacy  PDMP reviewed Refill to pharmacy  Chronic kidney disease (CKD), stage 4 (HCC) Refilled sodium bicarbonate  BMP today with [facility]  Suzann Daring, MD  Family Medicine Teaching Service  [facility]    "

## 2024-02-14 NOTE — Assessment & Plan Note (Addendum)
 Doing well Repeat BMP today Euvolemic on exam No edema or significant JVP Lasix  40 mg continued Restart metoprolol 

## 2024-02-14 NOTE — Assessment & Plan Note (Addendum)
 Refilled sodium bicarbonate  BMP today with [facility]

## 2024-02-18 ENCOUNTER — Telehealth: Payer: Self-pay

## 2024-02-18 MED ORDER — FLUCONAZOLE 150 MG PO TABS: 150.0000 mg | ORAL_TABLET | Freq: Once | ORAL | 0 refills | Status: AC

## 2024-02-20 NOTE — Telephone Encounter (Signed)
 Burnard confirmed that lab results have been received and placed in Dr. Orlinda box.   [name], RN

## 2024-02-20 NOTE — Telephone Encounter (Signed)
 Called Kelly at [facility]. Confidential VM left requesting that lab results be faxed to our office.   [name], RN

## 2024-02-20 NOTE — Telephone Encounter (Signed)
 Please call [facility] and request results of labs be sent to our office today. Suzann Daring, MD  Family Medicine Teaching Service

## 2024-02-21 ENCOUNTER — Inpatient Hospital Stay (HOSPITAL_COMMUNITY): Admission: EM | Admit: 2024-02-21 | Source: Home / Self Care

## 2024-02-21 ENCOUNTER — Other Ambulatory Visit: Payer: Self-pay

## 2024-02-21 ENCOUNTER — Encounter (HOSPITAL_COMMUNITY): Payer: Self-pay | Admitting: Emergency Medicine

## 2024-02-21 ENCOUNTER — Emergency Department (HOSPITAL_COMMUNITY)

## 2024-02-21 ENCOUNTER — Telehealth: Payer: Self-pay | Admitting: Family Medicine

## 2024-02-21 DIAGNOSIS — Z789 Other specified health status: Secondary | ICD-10-CM

## 2024-02-21 DIAGNOSIS — J449 Chronic obstructive pulmonary disease, unspecified: Secondary | ICD-10-CM | POA: Diagnosis present

## 2024-02-21 DIAGNOSIS — I509 Heart failure, unspecified: Principal | ICD-10-CM

## 2024-02-21 DIAGNOSIS — I5032 Chronic diastolic (congestive) heart failure: Secondary | ICD-10-CM

## 2024-02-21 LAB — CBC WITH DIFFERENTIAL/PLATELET
Abs Immature Granulocytes: 0.62 10*3/uL — ABNORMAL HIGH (ref 0.00–0.07)
Basophils Absolute: 0.1 10*3/uL (ref 0.0–0.1)
Basophils Relative: 1 %
Eosinophils Absolute: 0.2 10*3/uL (ref 0.0–0.5)
Eosinophils Relative: 2 %
HCT: 29.5 % — ABNORMAL LOW (ref 36.0–46.0)
Hemoglobin: 9.3 g/dL — ABNORMAL LOW (ref 12.0–15.0)
Immature Granulocytes: 6 %
Lymphocytes Relative: 23 %
Lymphs Abs: 2.6 10*3/uL (ref 0.7–4.0)
MCH: 30.5 pg (ref 26.0–34.0)
MCHC: 31.5 g/dL (ref 30.0–36.0)
MCV: 96.7 fL (ref 80.0–100.0)
Monocytes Absolute: 0.9 10*3/uL (ref 0.1–1.0)
Monocytes Relative: 8 %
Neutro Abs: 6.7 10*3/uL (ref 1.7–7.7)
Neutrophils Relative %: 60 %
Platelets: 238 10*3/uL (ref 150–400)
RBC: 3.05 MIL/uL — ABNORMAL LOW (ref 3.87–5.11)
RDW: 16.6 % — ABNORMAL HIGH (ref 11.5–15.5)
Smear Review: NORMAL
WBC: 11.1 10*3/uL — ABNORMAL HIGH (ref 4.0–10.5)
nRBC: 0 % (ref 0.0–0.2)

## 2024-02-21 LAB — BASIC METABOLIC PANEL WITH GFR
Anion gap: 11 (ref 5–15)
BUN: 27 mg/dL — ABNORMAL HIGH (ref 8–23)
CO2: 24 mmol/L (ref 22–32)
Calcium: 10 mg/dL (ref 8.9–10.3)
Chloride: 102 mmol/L (ref 98–111)
Creatinine, Ser: 2.21 mg/dL — ABNORMAL HIGH (ref 0.44–1.00)
GFR, Estimated: 22 mL/min — ABNORMAL LOW
Glucose, Bld: 164 mg/dL — ABNORMAL HIGH (ref 70–99)
Potassium: 4.1 mmol/L (ref 3.5–5.1)
Sodium: 137 mmol/L (ref 135–145)

## 2024-02-21 LAB — RESP PANEL BY RT-PCR (RSV, FLU A&B, COVID)  RVPGX2
Influenza A by PCR: NEGATIVE
Influenza B by PCR: NEGATIVE
Resp Syncytial Virus by PCR: NEGATIVE
SARS Coronavirus 2 by RT PCR: NEGATIVE

## 2024-02-21 LAB — TROPONIN T, HIGH SENSITIVITY
Troponin T High Sensitivity: 16 ng/L (ref 0–19)
Troponin T High Sensitivity: 16 ng/L (ref 0–19)

## 2024-02-21 LAB — MAGNESIUM: Magnesium: 1.8 mg/dL (ref 1.7–2.4)

## 2024-02-21 LAB — CBG MONITORING, ED
Glucose-Capillary: 141 mg/dL — ABNORMAL HIGH (ref 70–99)
Glucose-Capillary: 125 mg/dL — ABNORMAL HIGH (ref 70–99)

## 2024-02-21 LAB — PRO BRAIN NATRIURETIC PEPTIDE: Pro Brain Natriuretic Peptide: 13895 pg/mL — ABNORMAL HIGH

## 2024-02-21 MED ORDER — ACETAMINOPHEN 325 MG PO TABS
650.0000 mg | ORAL_TABLET | Freq: Four times a day (QID) | ORAL | Status: AC | PRN
  Administered 2024-02-21 (×2): 650 mg via ORAL
  Filled 2024-02-21 (×2): qty 2

## 2024-02-21 MED ORDER — SENNA 8.6 MG PO TABS
1.0000 | ORAL_TABLET | Freq: Every day | ORAL | Status: AC
  Administered 2024-02-21: 8.6 mg via ORAL
  Filled 2024-02-21: qty 1

## 2024-02-21 MED ORDER — DIVALPROEX SODIUM 250 MG PO DR TAB
250.0000 mg | DELAYED_RELEASE_TABLET | Freq: Two times a day (BID) | ORAL | Status: AC
  Administered 2024-02-21 (×2): 250 mg via ORAL
  Filled 2024-02-21 (×2): qty 1

## 2024-02-21 MED ORDER — FUROSEMIDE 10 MG/ML IJ SOLN: 40.0000 mg | Freq: Once | INTRAMUSCULAR | Status: DC

## 2024-02-21 MED ORDER — ACETAMINOPHEN 650 MG RE SUPP: 650.0000 mg | Freq: Four times a day (QID) | RECTAL | Status: AC | PRN

## 2024-02-21 MED ORDER — ALLOPURINOL 100 MG PO TABS
50.0000 mg | ORAL_TABLET | Freq: Every morning | ORAL | Status: AC
  Administered 2024-02-21: 50 mg via ORAL
  Filled 2024-02-21: qty 1

## 2024-02-21 MED ORDER — FUROSEMIDE 10 MG/ML IJ SOLN
80.0000 mg | Freq: Once | INTRAMUSCULAR | Status: AC
  Administered 2024-02-21: 80 mg via INTRAVENOUS
  Filled 2024-02-21: qty 8

## 2024-02-21 MED ORDER — PRAVASTATIN SODIUM 40 MG PO TABS
40.0000 mg | ORAL_TABLET | Freq: Every evening | ORAL | Status: AC
  Administered 2024-02-21: 40 mg via ORAL
  Filled 2024-02-21: qty 1

## 2024-02-21 MED ORDER — BUDESON-GLYCOPYRROL-FORMOTEROL 160-9-4.8 MCG/ACT IN AERO
2.0000 | INHALATION_SPRAY | Freq: Two times a day (BID) | RESPIRATORY_TRACT | Status: AC
  Administered 2024-02-21: 2 via RESPIRATORY_TRACT
  Filled 2024-02-21: qty 5.9

## 2024-02-21 MED ORDER — POLYETHYLENE GLYCOL 3350 17 G PO PACK
17.0000 g | PACK | Freq: Every day | ORAL | Status: AC
  Administered 2024-02-21: 17 g via ORAL
  Filled 2024-02-21: qty 1

## 2024-02-21 MED ORDER — IPRATROPIUM-ALBUTEROL 0.5-2.5 (3) MG/3ML IN SOLN
3.0000 mL | Freq: Once | RESPIRATORY_TRACT | Status: AC
  Administered 2024-02-21: 3 mL via RESPIRATORY_TRACT
  Filled 2024-02-21: qty 3

## 2024-02-21 MED ORDER — IPRATROPIUM-ALBUTEROL 0.5-2.5 (3) MG/3ML IN SOLN: 3.0000 mL | RESPIRATORY_TRACT | Status: AC | PRN

## 2024-02-21 MED ORDER — OLANZAPINE 10 MG PO TABS
15.0000 mg | ORAL_TABLET | Freq: Every day | ORAL | Status: AC
  Administered 2024-02-21: 15 mg via ORAL
  Filled 2024-02-21: qty 2

## 2024-02-21 MED ORDER — APIXABAN 2.5 MG PO TABS
2.5000 mg | ORAL_TABLET | Freq: Two times a day (BID) | ORAL | Status: AC
  Administered 2024-02-21 (×2): 2.5 mg via ORAL
  Filled 2024-02-21 (×2): qty 1

## 2024-02-21 MED ORDER — AMOXICILLIN 500 MG PO CAPS
500.0000 mg | ORAL_CAPSULE | Freq: Two times a day (BID) | ORAL | Status: AC
  Administered 2024-02-21 (×2): 500 mg via ORAL
  Filled 2024-02-21 (×2): qty 1

## 2024-02-21 MED ORDER — FUROSEMIDE 20 MG PO TABS: 40.0000 mg | ORAL_TABLET | Freq: Every day | ORAL | Status: DC

## 2024-02-21 MED ORDER — FUROSEMIDE 10 MG/ML IJ SOLN
40.0000 mg | Freq: Once | INTRAMUSCULAR | Status: AC
  Administered 2024-02-21: 40 mg via INTRAVENOUS
  Filled 2024-02-21: qty 4

## 2024-02-21 MED ORDER — SODIUM BICARBONATE 650 MG PO TABS
650.0000 mg | ORAL_TABLET | Freq: Three times a day (TID) | ORAL | Status: AC
  Administered 2024-02-21 (×3): 650 mg via ORAL
  Filled 2024-02-21 (×3): qty 1

## 2024-02-21 NOTE — H&P (Addendum)
 "    Hospital Admission History and Physical Service Pager: [phone]  Patient name: Abigail Wallace [facility] Medical record number: [id number] Date of Birth: 04-01-1945 Age: 79 y.o. Gender: female  Primary Care Provider: Delores Suzann HERO, MD Consultants: None Code Status: Full Preferred Emergency Contact: [name] (son)  Name Relation Home Work [place] Son [phone]    Name Relation Home Work Mobile   Tennie Charlie Blades [phone]  [phone]   Chief Complaint: shortness of breath  Differential and Medical Decision Making:  Woodrow Dulski [facility] is a 79 y.o. female with PMH of A fib, HFpEF, CAD, SSS s/p PPM insertion, HTN, GERD, CKD IV with chronic indwelling Foley cath, T2DM, COPD, schizophrenia, gout, obesity presenting with shortness of breath.   Differential for this patient's presentation of this includes: - CHF exacerbation: Most likely given [place] [address]; this is increased from 8279, collected at prior admission. Also consistent with patient's symptoms and physical exam findings. - MI: Patient with chest pain, not new but worsening. EKG with new inversions in I, paced rhythm, official read pending. Initial troponin normal (16), will trend x1 - COPD exacerbation: Patient does have some mild expiratory wheeze on exam, but lower concern given no cough. Will give one DuoNeb now, keep patient on home inhaler, and add prn DuoNebs. - Infection: Low concern given Xray without read of PNA per radiologist report; also with negative resp panel, no URI symptoms. WBC is minimally elevated to 11.1, though no increase in neutrophils on diff.  Cr of 2.21 on admission, improved from Cr on 02/11/24 of 3.51 (at time of discharge from last admission); patient did experience AKI with diuresis last admission so will monitor Cr closely while diuresing patient  Interestingly, patient's weight on admission this hospitalization is 315 lb; discharge weight  from last admission 01/2024 was 321 lb. Unclear what patient's actual dry weight is. Assessment & Plan Acute exacerbation of CHF (congestive heart failure) (HCC) S/p IV Lasix  40 mg in ED. Will monitor Is/Os following this and home Lasix  dose. - Admit to FMTS, admitting Dr. Orie, attending Dr. McDiarmid - Med Tele level of care, Vital signs per floor - Diuresis: S/p 40mg  IV Lasix  in ED - redose IV lasix  as indicated and transition to PO diuresis as able  - Oxygen : Currently on 2L O2 via [place]; wean as able - Pain: Tylenol  650 mg q6h PRN - Bowel regimen: Miralax  daily, Senna daily - Continue sodium bicarb 650 mg TID - Continuous cardiac monitoring, pulse ox - Strict Is/Os; follow weight - Follow troponin trend (initial wnl) - Labs: AM CBC, BMP, Mg - Fall precautions - Delirium precautions - Day team to reach out to patient's son, [name], to provide updates and clarify home meds + code status Chronic obstructive pulmonary disease (HCC) - Giving DuoNeb x1 now - DuoNebs q4h PRN - Breztri  2 puffs BID (formulary equivalent of home Trelegy Ellipta ) Chronic health problem A fib: Continue home Eliquis  2.5 mg BID for stroke prophylaxis. No rate/rhythm control (patient is paced) HTN: Home BP medications include amlodipine  10 mg daily at bedtime, lopressor  100 mg TID previously held; will hold for now and consider resuming as indicated HLD: Continue home pravastatin  40 mg daily Schizophrenia: Continue home Olanzapine  15 mg daily at bedtime, Depakote  250 mg BID Strep Bacteremia s/p infected PPM: Continue home Amoxicillin  500 mg BID (lifelong prophylaxis per ID note 01/30/24) Gout: Continue home Allopurinol  50 mg daily T2DM: CBG QACHS. Hold home linagliptin .  Chronic  pain: Home regimen includes oxycodone  5 q12 PRN. Consider spot dosing as needed.   FEN/GI: Heart healthy/carb modified VTE Prophylaxis: Home Eliquis  2.5 mg BID  Disposition: Med-Tele  History of Present Illness:  Abigail Wallace  is a 79 y.o. female with PMH of A fib, HFpEF, CAD, SSS s/p PPM insertion s/p infection with strep, HTN, GERD, CKD IV with chronic indwelling Foley cath, T2DM, COPD, schizophrenia, gout, obesity presenting with shortness of breath.  Recently hospitalized 1/22-1/228 for CHF exacerbation, treated with diuresis. Discharge weight of 321 lb.  Patient presenting today with worsening shortness of breath which started last night; she had shortness of breath throughout this week. No cough. Dry nose, no congestion. No known fever, endorses some cold chills. On 2L O2 via [place] at home; per patient's report, she does not feel like she needs this constantly, but son wants her to be on this constantly.  Also reports central and right chest pain, described as stabbing, which she has had intermittently but which grew worse yesterday.  Endorses belly and back pain, chronic. Endorses dry heaving this past week, no vomiting. LBM yesterday, soft and hard, did have to take a suppository this week; no blood. Says chronic foley has been leaking.  [place] [address], WBC 11.1. CXR with possible L pleural effusion. On 2-3L of O2. Got 40mg  IV Lasix  ~4am with some improvement in symptoms, though ED provider hesitant to give more given pt had AKI suspected 2/2 IV Lasix  last admission in 01/2024.  Review Of Systems: Per HPI  Pertinent Past Medical History: A fib on Eliquis  HFpEF CAD SSS s/p PPM insertion CKD IV with chronic indwelling Foley cath T2DM COPD Schizophrenia Obesity HTN Gout GERD Remainder reviewed in history tab.   Pertinent Past Surgical History: - PPM placed 09/2005, generated changed 08/2014 - Cholecystectomy  Remainder reviewed in history tab.   Pertinent Social History: Tobacco use: Former Alcohol  use: None Other Substance use: None Lives with son, [name]  Pertinent Family History: Mother: heart disease Father: Heart disease Sister: Stroke (deceased) Son: T2DM  Important Outpatient  Medications: - Amoxicillin  500 mg BID (lifelong for Strep bacteremia s/p infected PPM) - Allopurinol  50 mg daily - Eliquis  2.5 mg BID - Trelegy Ellipta  1 puff daily - Lasix  40 mg daily - Linagliptin  5 mg daily - Loperamide  2 mg PRN - Olanzapine  15 mg daily at bedtime - Depakote  250 mg BID - Oxycodone  5 mg BID PRN - Pravastatin  40 mg daily - Sodium bicarb 650 mg TID   Objective: BP 122/78 (BP Location: Right Leg)   Pulse 61   Temp 97.7 F (36.5 C) (Oral)   Resp 19   Ht 5' 6 (1.676 m)   Wt (!) 142.9 kg Comment: per last hospital admission  SpO2 98%   BMI 50.84 kg/m  Exam: General: Patient lying on side in bed, no acute distress. Eyes: PERRL, EOMI intact, conjunctiva non injected bilaterally. ENTM: Nares patent bilaterally. MMM. Neck: Supple Cardiovascular: Difficult to discern heart sounds given body habitus, S1/S2 appreciated, no obvious murmur. Respiratory: Normal work of breathing on 2L O2 via [place]. Crackles in left lung base, scattered intermittent expiratory wheeze. Gastrointestinal: Bowel sounds present and normoactive bilaterally. Soft, protuberant. Hernia palpated to right of umbilicus, tender to palpation, soft, no overlying skin changes. MSK: Patient able to move all 4 extremities. Extremities: Warm, dry; 1-2+ pitting edema of bilateral lower extremities. Derm: No rash seen of visible skin Neuro: Alert, oriented to name, DOB, location, month  not year. Moves all extremities spontaneously.  Psych: Appropriate affect.  Labs:  CBC BMET  Recent Labs  Lab 02/21/24 0248  WBC 11.1*  HGB 9.3*  HCT 29.5*  PLT 238   Recent Labs  Lab 02/21/24 0248  NA 137  K 4.1  CL 102  CO2 24  BUN 27*  CREATININE 2.21*  GLUCOSE 164*  CALCIUM  10.0    Mag 1.8 Resp Panel Negative BNP 13895 Trop 16 > pending  EKG: My own interpretation (not copied from electronic read) New inversions in I, paced rhythm  Imaging Studies Performed:  CXR: Impression from Radiologist: 1.  Possible left pleural effusion. 2. Cardiomegaly with calcified aorta and pacemaker. My Interpretation: Fluffy opacities bilaterally concerning for edema, though difficult to discern given image quality. PPM seen.   Larraine Palma, MD 02/21/2024, 5:32 AM PGY-1, [facility] Health Family Medicine  FPTS Intern pager: [phone], text pages welcome Secure chat group [facility] Teaching Service   I have verified that the service and findings are accurately documented in the residents note above.  Damien Cassis, MD                  02/21/2024, 7:37 AM   "

## 2024-02-21 NOTE — Plan of Care (Addendum)
 Spoke with son, Renay Arenas on the phone. Provided updates, all questions answered. Will plan to update once daily. He would not like her to receive sliding scale insulin , will continue to monitor CBGs.

## 2024-02-21 NOTE — ED Notes (Signed)
 Patient repositioned, new bed pads placed underneath patient.

## 2024-02-21 NOTE — Assessment & Plan Note (Signed)
-   DuoNeb x1 given - DuoNebs q4h PRN - Breztri  2 puffs BID (formulary equivalent of home Trelegy Ellipta )

## 2024-02-21 NOTE — ED Notes (Signed)
 Pt resting in bed with 2lpm O2 via [place]. Remains on monitor. Son, [name] at bedside.

## 2024-02-21 NOTE — Assessment & Plan Note (Signed)
-   Giving DuoNeb x1 now - DuoNebs q4h PRN - Breztri  2 puffs BID (formulary equivalent of home Trelegy Ellipta )

## 2024-02-21 NOTE — Progress Notes (Signed)
 "    Daily Progress Note Intern Pager: [phone]  Patient name: Abigail Wallace Medical record number: [id number] Date of birth: 1945/05/04 Age: 79 y.o. Gender: female  Primary Care Provider: Delores Suzann HERO, MD Consultants: None Code Status: Full  Pt Overview and Major Events to Date:  2/6 - admitted  Medical Decision Making:  Abigail Wallace [facility] is a 79 y.o. female with PMH of A fib, HFpEF, CAD, SSS s/p PPM insertion, HTN, GERD, CKD IV with chronic indwelling Foley cath, T2DM, COPD, schizophrenia, gout, obesity presenting with shortness of breath likely from heart failure exacerbation.   Assessment & Plan Acute exacerbation of CHF (congestive heart failure) (HCC) Acute on chronic heart failure (HCC) Lasix  IV 40mg  produced ~350mL of urine. Will monitor Is/Os following this and home Lasix  dose. - Admit to FMTS, admitting Dr. Orie, attending Dr. McDiarmid - Med Tele level of care, Vital signs per floor - Diuresis: S/p 40mg  IV Lasix  in ED, and redosing 80mg  IV Lasix  - redose IV lasix  as indicated and transition to PO diuresis as able  - Oxygen : Currently on 2L O2 via [place]; wean as able - Pain: Tylenol  650 mg q6h PRN - Bowel regimen: Miralax  daily, Senna daily - Continue sodium bicarb 650 mg TID - Continuous cardiac monitoring, pulse ox - Strict Is/Os; follow weight - Follow troponin trend (initial wnl) - Labs: AM CBC, BMP, Mg - Fall precautions - Delirium precautions - Day team to reach out to patient's son, Abigail Wallace, daily to provide updates and clarify home meds + code status Chronic obstructive pulmonary disease (HCC) - DuoNeb x1 given - DuoNebs q4h PRN - Breztri  2 puffs BID (formulary equivalent of home Trelegy Ellipta ) Chronic health problem A fib: Continue home Eliquis  2.5 mg BID for stroke prophylaxis. No rate/rhythm control (patient is paced) HTN: Home BP medications include amlodipine  10 mg daily at bedtime, lopressor  100 mg TID previously held; will hold for  now and consider resuming as indicated HLD: Continue home pravastatin  40 mg daily Schizophrenia: Continue home Olanzapine  15 mg daily at bedtime, Depakote  250 mg BID Strep Bacteremia s/p infected PPM: Continue home Amoxicillin  500 mg BID (lifelong prophylaxis per ID note 01/30/24) Gout: Continue home Allopurinol  50 mg daily T2DM: CBG QACHS. Hold home linagliptin .  Chronic pain: Home regimen includes oxycodone  5 q12 PRN. Consider spot dosing as needed.   FEN/GI: Heart Healthy, restriction PPx: Eliquis  2.5mg  BID Dispo:[place] home pending clinical improvement . Barriers include clinical improvement.   Subjective:  On interview this morning, pt verified that she came in for SOB which has somewhat improved. Reports that her pain has improved as well. Is resting comfortably on 2L [place], which she says she is on at home. All pt questions answered  Objective: Temp:  [97.7 F (36.5 C)-98.3 F (36.8 C)] 98.3 F (36.8 C) (02/06 0857) Pulse Rate:  [59-92] 70 (02/06 1130) Resp:  [14-27] 24 (02/06 1130) BP: (122-176)/(62-97) 142/97 (02/06 1130) SpO2:  [82 %-100 %] 100 % (02/06 1130) Weight:  [142.9 kg] 142.9 kg (02/06 0219)  Physical Exam Vitals and nursing note reviewed. Exam conducted with a chaperone present.  Constitutional:      General: She is not in acute distress.    Appearance: She is obese.  Cardiovascular:     Rate and Rhythm: Normal rate and regular rhythm.     Heart sounds: No murmur heard. Pulmonary:     Effort: Pulmonary effort is normal. No respiratory distress.     Breath sounds:  Examination of the left-lower field reveals rales. Decreased breath sounds, wheezing and rales present.     Comments: On 2L [place] Abdominal:     Palpations: Abdomen is soft.     Tenderness: There is no abdominal tenderness.  Musculoskeletal:     Right lower leg: 4+ Pitting Edema present.     Left lower leg: 4+ Pitting Edema present.  Skin:    General: Skin is warm and dry.     Coloration:  Skin is pale.  Neurological:     Mental Status: She is alert and oriented to person, place, and time.     Laboratory: Most recent CBC Lab Results  Component Value Date   WBC 11.1 (H) 02/21/2024   HGB 9.3 (L) 02/21/2024   HCT 29.5 (L) 02/21/2024   MCV 96.7 02/21/2024   PLT 238 02/21/2024   Most recent BMP    Latest Ref Rng & Units 02/21/2024    2:48 AM  BMP  Glucose 70 - 99 mg/dL 835   BUN 8 - 23 mg/dL 27   Creatinine 9.55 - 1.00 mg/dL 7.78   Sodium 864 - 854 mmol/L 137   Potassium 3.5 - 5.1 mmol/L 4.1   Chloride 98 - 111 mmol/L 102   CO2 22 - 32 mmol/L 24   Calcium  8.9 - 10.3 mg/dL 89.9    About 300mL UOP at time of interview around 8:30  Imaging/Diagnostic Tests: DG Chest 2 View Result Date: 02/21/2024 EXAM: 2 VIEW(S) XRAY OF THE CHEST 02/21/2024 03:14:00 AM COMPARISON: 02/06/2024 CLINICAL HISTORY: Shortness of breath. FINDINGS: LINES, TUBES AND DEVICES: Left upper chest pacemaker. Overlapping cardiac leads. LUNGS AND PLEURA: Left retrocardiac opacity with poor definition of the left hemidiaphragm. Possible left pleural effusion. No pneumothorax. HEART AND MEDIASTINUM: Cardiomegaly with calcified aorta. BONES AND SOFT TISSUES: No acute osseous abnormality. IMPRESSION: 1. Possible left pleural effusion. 2. Cardiomegaly with calcified aorta and pacemaker. Electronically signed by: Greig Pique MD 02/21/2024 03:32 AM EST RP Workstation: [id number]    Abigail Nancyann NOVAK, DO 02/21/2024, 12:45 PM  PGY-1, [facility] Health Family Medicine FPTS Intern pager: [phone], text pages welcome Secure chat group [facility] Teaching Service   "

## 2024-02-21 NOTE — Telephone Encounter (Signed)
 FMTS After Hours Line Phone Call Received after hours phone from patient. Called number and reached patient's son [name] who confirmed patient's identity.   Patient's son [name] reports that the patient is being brought to the ED via EMS for suspected heart failure related symptoms. Patient reports that PCP informed him to reach out to our team any time the patient was sent to the ED. Thanked patient for the notification. Our team will be ready to provide care if needed as determined by EDP.   I am routing this note to the PCP.  Damien Cassis, MD  Family Medicine Teaching Service

## 2024-02-21 NOTE — ED Notes (Signed)
 [name] Arenas, patient son updated on patient plan of care via phone call. Pt states she has been keeping son updated on her personal cell phone as well.

## 2024-02-21 NOTE — Assessment & Plan Note (Signed)
 Lasix  IV 40mg  produced ~361mL of urine. Will monitor Is/Os following this and home Lasix  dose. - Admit to FMTS, admitting Dr. Orie, attending Dr. McDiarmid - Med Tele level of care, Vital signs per floor - Diuresis: S/p 40mg  IV Lasix  in ED, and redosing 80mg  IV Lasix  - redose IV lasix  as indicated and transition to PO diuresis as able  - Oxygen : Currently on 2L O2 via [place]; wean as able - Pain: Tylenol  650 mg q6h PRN - Bowel regimen: Miralax  daily, Senna daily - Continue sodium bicarb 650 mg TID - Continuous cardiac monitoring, pulse ox - Strict Is/Os; follow weight - Follow troponin trend (initial wnl) - Labs: AM CBC, BMP, Mg - Fall precautions - Delirium precautions - Day team to reach out to patient's son, [name], daily to provide updates and clarify home meds + code status

## 2024-02-21 NOTE — Progress Notes (Signed)
 PCP Brief Note In to see patient as PCP. She is alert, conversant, and appropriate this AM. Seems improved from admission after Lasix . Appreciate FMTS care.  Suzann Daring, MD  Family Medicine Teaching Service

## 2024-02-21 NOTE — ED Triage Notes (Signed)
 Patient BIB EMS from home for evaluation of shortness of breath x  7 hours.  Hx of end-stage renal disease and CHF.  Uses 2 L [place] at home as needed.  Has foley catheter in place.  Unable to ambulate.  Lung sounds decreased bilaterally per EMS.

## 2024-02-21 NOTE — ED Provider Notes (Signed)
 " [facility] AT [facility] Provider Note   CSN: [id number] Arrival date & time: 02/21/24  0205     Patient presents with: Shortness of Breath   Abigail Wallace [facility] is a 79 y.o. female.  Patient with history of CHF, CKD, type II DM, COPD, schizophrenia, pacemaker, obesity presents to the emergency department complaining of shortness of breath.  Patient states that she has had worsening shortness of breath for the past 7 to 10 hours.  Upon arrival she is on a nonrebreather mask.  She is bedbound at baseline.  She denies abdominal pain, nausea, vomiting, chest pain.  She endorses taking her medications as prescribed.    Shortness of Breath      Prior to Admission medications  Medication Sig Start Date End Date Taking? Authorizing Provider  acetaminophen  (TYLENOL ) 500 MG tablet Take 500-1,000 mg by mouth every 6 (six) hours as needed for mild pain (pain score 1-3) or headache.    [provider]  albuterol  (VENTOLIN  HFA) 108 (90 Base) MCG/ACT inhaler Inhale 2 puffs into the lungs every 6 (six) hours as needed for wheezing or shortness of breath. 11/11/23   Delores Suzann HERO, MD  allopurinol  (ZYLOPRIM ) 100 MG tablet Take 0.5 tablets (50 mg total) by mouth in the morning. 01/16/24   Nicholas Bar, MD  [Paused] amLODipine  (NORVASC ) 10 MG tablet Take 1 tablet (10 mg total) by mouth at bedtime. Wait to take this until your doctor or other care provider tells you to start again. 10/11/23   Delores Suzann HERO, MD  amoxicillin  (AMOXIL ) 500 MG capsule Take 1 capsule (500 mg total) by mouth in the morning and at bedtime. 01/30/24   Manandhar, Sabina, MD  apixaban  (ELIQUIS ) 2.5 MG TABS tablet Take 1 tablet (2.5 mg total) by mouth 2 (two) times daily. 01/20/24   Delores Suzann HERO, MD  cholecalciferol  (VITAMIN D3) 25 MCG (1000 UNIT) tablet Take 1 tablet (1,000 Units total) by mouth daily. 08/12/23   Rumball, Alison M, DO  diclofenac  Sodium (VOLTAREN ) 1 % GEL Apply 2 g  topically 4 (four) times daily as needed (pain). 08/19/23   Delores Suzann HERO, MD  divalproex  (DEPAKOTE ) 250 MG DR tablet Take 1 tablet (250 mg total) by mouth every 12 (twelve) hours. 03/05/22   Samtani, Jai-Gurmukh, MD  Fluticasone -Umeclidin-Vilant (TRELEGY ELLIPTA ) 100-62.5-25 MCG/ACT AEPB Inhale 1 puff into the lungs daily. 11/11/23   Delores Suzann HERO, MD  furosemide  (LASIX ) 40 MG tablet Take 1 tablet (40 mg total) by mouth daily. 01/16/24 01/15/25  Nicholas Bar, MD  glucose blood (ACCU-CHEK GUIDE TEST) test strip Use to check blood glucose once daily and as needed for symptoms 11/08/23   Delores Suzann HERO, MD  ipratropium-albuterol  (DUONEB) 0.5-2.5 (3) MG/3ML SOLN Take 3 mLs by nebulization every 4 (four) hours as needed. Patient taking differently: Take 3 mLs by nebulization every 4 (four) hours as needed (for shortness of breath). 11/11/23   Delores Suzann HERO, MD  Lancets (FREESTYLE) lancets Use to check blood glucose once daily and as needed for symptoms 11/08/23   Delores Suzann HERO, MD  linagliptin  (TRADJENTA ) 5 MG TABS tablet Take 1 tablet (5 mg total) by mouth daily. 12/27/23   Baloch, Mahnoor, MD  loperamide  (IMODIUM ) 2 MG capsule Take 1 capsule (2 mg total) by mouth as needed for diarrhea or loose stools. 12/27/23   Baloch, Mahnoor, MD  [Paused] metoprolol  tartrate (LOPRESSOR ) 100 MG tablet Take 1 tablet (100 mg total) by mouth 3 (three)  times daily. Wait to take this until your doctor or other care provider tells you to start again. 09/04/23   Delores Suzann HERO, MD  Multiple Vitamins-Minerals (MULTIVITAMIN WOMEN 50+) TABS Take 1 tablet by mouth daily with breakfast.    [provider]  nitroGLYCERIN  (NITROSTAT ) 0.4 MG SL tablet Place 1 tablet (0.4 mg total) under the tongue every 5 (five) minutes as needed for chest pain. 08/03/22 01/30/24  Croitoru, Mihai, MD  nystatin  cream (MYCOSTATIN ) Apply 1 Application topically daily as needed (Rash). Patient taking differently: Apply 1 Application  topically daily as needed for dry skin. 11/21/23   Delores Suzann HERO, MD  nystatin  powder Apply 1 Application topically daily. Apply after bathing. Patient taking differently: Apply 1 Application topically daily as needed (bacteria). Apply after bathing. 10/04/23   Delores Suzann HERO, MD  OLANZapine  (ZYPREXA ) 10 MG tablet Take 15 mg by mouth at bedtime. 05/17/23   [provider]  OVER THE COUNTER MEDICATION Apply 1 application  topically See admin instructions. Caldesene Medicated Protecting Powder- Apply under the breasts and between the skin folds 2 times a day as needed for irritation    [provider]  oxyCODONE  (ROXICODONE ) 5 MG immediate release tablet Take 1 tablet (5 mg total) by mouth every 12 (twelve) hours as needed for severe pain (pain score 7-10). 02/14/24   Delores Suzann HERO, MD  pravastatin  (PRAVACHOL ) 40 MG tablet TAKE 1 TABLET (40 MG TOTAL) BY MOUTH EVERY EVENING. 01/31/24   Croitoru, Mihai, MD  sodium bicarbonate  650 MG tablet Take 1 tablet (650 mg total) by mouth 3 (three) times daily. 02/14/24   Delores Suzann HERO, MD  triamcinolone  ointment (KENALOG ) 0.5 % Apply 1 Application topically 2 (two) times daily as needed (skin irritation). 10/04/23   Delores Suzann HERO, MD  Zinc  Oxide (DESITIN EX) Apply 1 Application topically daily as needed (rash).    [provider]    Allergies: Abilify [aripiprazole], Keflex  [cephalexin ], Versacloz  [clozapine ], Ativan  [lorazepam ], Benadryl [diphenhydramine hcl], Benztropine , Latuda [lurasidone hcl], Remeron [mirtazapine], Haldol  [haloperidol  lactate], Codeine, and Latex    Review of Systems  Respiratory:  Positive for shortness of breath.     Updated Vital Signs BP 122/78 (BP Location: Right Leg)   Pulse 61   Temp 97.7 F (36.5 C) (Oral)   Resp 19   Ht 5' 6 (1.676 m)   Wt (!) 142.9 kg Comment: per last hospital admission  SpO2 98%   BMI 50.84 kg/m   Physical Exam Vitals and nursing note reviewed.  Constitutional:       General: She is not in acute distress.    Appearance: She is well-developed. She is obese.  HENT:     Head: Normocephalic and atraumatic.     Mouth/Throat:     Mouth: Mucous membranes are moist.  Eyes:     General: Vision grossly intact. Gaze aligned appropriately.     Extraocular Movements: Extraocular movements intact.     Conjunctiva/sclera: Conjunctivae normal.  Cardiovascular:     Rate and Rhythm: Normal rate and regular rhythm.     Pulses: Normal pulses.     Heart sounds: Normal heart sounds, S1 normal and S2 normal. No murmur heard.    No friction rub. No gallop.  Pulmonary:     Effort: Pulmonary effort is normal. No respiratory distress.     Breath sounds: Normal breath sounds. No decreased breath sounds.  Abdominal:     General: Bowel sounds are normal.     Palpations: Abdomen  is soft.     Tenderness: There is no abdominal tenderness. There is no guarding or rebound.     Hernia: No hernia is present.  Musculoskeletal:        General: No swelling.     Cervical back: Full passive range of motion without pain, normal range of motion and neck supple. No spinous process tenderness or muscular tenderness. Normal range of motion.     Right lower leg: Edema present.     Left lower leg: Edema present.  Skin:    General: Skin is warm and dry.     Capillary Refill: Capillary refill takes less than 2 seconds.     Findings: No ecchymosis, erythema, rash or wound.  Neurological:     General: No focal deficit present.     Mental Status: She is alert and oriented to person, place, and time.     GCS: GCS eye subscore is 4. GCS verbal subscore is 5. GCS motor subscore is 6.     Cranial Nerves: Cranial nerves 2-12 are intact.     Sensory: Sensation is intact.     Motor: Motor function is intact.     Coordination: Coordination is intact.  Psychiatric:        Attention and Perception: Attention normal.        Mood and Affect: Mood normal.        Speech: Speech normal.        Behavior:  Behavior normal.     (all labs ordered are listed, but only abnormal results are displayed) Labs Reviewed  BASIC METABOLIC PANEL WITH GFR - Abnormal; Notable for the following components:      Result Value   Glucose, Bld 164 (*)    BUN 27 (*)    Creatinine, Ser 2.21 (*)    GFR, Estimated 22 (*)    All other components within normal limits  CBC WITH DIFFERENTIAL/PLATELET - Abnormal; Notable for the following components:   WBC 11.1 (*)    RBC 3.05 (*)    Hemoglobin 9.3 (*)    HCT 29.5 (*)    RDW 16.6 (*)    Abs Immature Granulocytes 0.62 (*)    All other components within normal limits  PRO BRAIN NATRIURETIC PEPTIDE - Abnormal; Notable for the following components:   Pro Brain Natriuretic Peptide 13,895.0 (*)    All other components within normal limits  RESP PANEL BY RT-PCR (RSV, FLU A&B, COVID)  RVPGX2  MAGNESIUM   TROPONIN T, HIGH SENSITIVITY    EKG: None  Radiology: DG Chest 2 View Result Date: 02/21/2024 EXAM: 2 VIEW(S) XRAY OF THE CHEST 02/21/2024 03:14:00 AM COMPARISON: 02/06/2024 CLINICAL HISTORY: Shortness of breath. FINDINGS: LINES, TUBES AND DEVICES: Left upper chest pacemaker. Overlapping cardiac leads. LUNGS AND PLEURA: Left retrocardiac opacity with poor definition of the left hemidiaphragm. Possible left pleural effusion. No pneumothorax. HEART AND MEDIASTINUM: Cardiomegaly with calcified aorta. BONES AND SOFT TISSUES: No acute osseous abnormality. IMPRESSION: 1. Possible left pleural effusion. 2. Cardiomegaly with calcified aorta and pacemaker. Electronically signed by: Greig Pique MD 02/21/2024 03:32 AM EST RP Workstation: [id number]     Procedures   Medications Ordered in the ED  furosemide  (LASIX ) injection 40 mg (40 mg Intravenous Given 02/21/24 0359)                                    Medical Decision Making Amount and/or Complexity of Data Reviewed  Labs: ordered. Radiology: ordered.  Risk Prescription drug management. Decision regarding  hospitalization.   This patient presents to the ED for concern of shortness of breath, this involves an extensive number of treatment options, and is a complaint that carries with it a high risk of complications and morbidity.  The differential diagnosis includes CHF exacerbation, COPD exacerbation, pneumonia, PE, dissection, others   Co morbidities / Chronic conditions that complicate the patient evaluation  As noted in HPI   Additional history obtained:  Additional history obtained from EMR External records from outside source obtained and reviewed including recent discharge summary   Lab Tests:  I Ordered, and personally interpreted labs.  The pertinent results include: proBNP 13,895, creatinine slightly improved at 2.21, white count mildly elevated 11,100   Imaging Studies ordered:  I ordered imaging studies including chest x-ray I independently visualized and interpreted imaging which showed  1. Possible left pleural effusion.  2. Cardiomegaly with calcified aorta and pacemaker.   I agree with the radiologist interpretation   Cardiac Monitoring: / EKG:  The patient was maintained on a cardiac monitor.  I personally viewed and interpreted the cardiac monitored which showed an underlying rhythm of: paced rhythm   Problem List / ED Course / Critical interventions / Medication management   I ordered medication including Lasix  Reevaluation of the patient after these medicines showed that the patient had minimal improvement I have reviewed the patients home medicines and have made adjustments as needed   Consultations Obtained:  I requested consultation with the family medicine team,  and discussed lab and imaging findings as well as pertinent plan - they recommend: admission   Social Determinants of Health:  Patient is a former smoker   Test / Admission - Considered:  Patient with presentation most consistent with acute on chronic heart failure exacerbation  with significantly elevated proBNP.  Patient did have some mild improvement in symptoms with Lasix  but at this time I feel that she would benefit from admission for further management due to continued shortness of breath.      Final diagnoses:  Acute on chronic heart failure, unspecified heart failure type [facility])    ED Discharge Orders     None          [name] Ubaldo KATHEE DEVONNA 02/21/24 0615    Mesner, Selinda, MD 02/21/24 0710  "

## 2024-02-21 NOTE — Progress Notes (Signed)
 Called son and provided additional updates. He appreciative of FMTS care.  Suzann Daring, MD  Family Medicine Teaching Service

## 2024-02-21 NOTE — Assessment & Plan Note (Addendum)
 A fib: Continue home Eliquis  2.5 mg BID for stroke prophylaxis. No rate/rhythm control (patient is paced) HTN: Home BP medications include amlodipine  10 mg daily at bedtime, lopressor  100 mg TID previously held; will hold for now and consider resuming as indicated HLD: Continue home pravastatin  40 mg daily Schizophrenia: Continue home Olanzapine  15 mg daily at bedtime, Depakote  250 mg BID Strep Bacteremia s/p infected PPM: Continue home Amoxicillin  500 mg BID (lifelong prophylaxis per ID note 01/30/24) Gout: Continue home Allopurinol  50 mg daily T2DM: CBG QACHS. Hold home linagliptin .  Chronic pain: Home regimen includes oxycodone  5 q12 PRN. Consider spot dosing as needed.

## 2024-02-21 NOTE — Hospital Course (Addendum)
 Abigail Wallace is a 79 y.o.female with a history of A fib, HFpEF, CAD, SSS s/p PPM insertion, HTN, GERD, CKD IV with chronic indwelling Foley cath, T2DM, COPD, schizophrenia, gout, and obesity who was admitted to the [facility] Medicine Teaching Service at [facility] for CHF Exacerbation. Her hospital course is detailed below:  CHF Exacerbation Pt had fluid on her lungs during admission and was given IV lasix  to diurese the fluid. She had a net loss of ***L.   COPD Duonebs*** were given as needed for shortness of breath to which there were no complications.  Other chronic conditions were medically managed with home medications and formulary alternatives as necessary (***)  PCP Follow-up Recommendations:

## 2024-02-21 NOTE — Assessment & Plan Note (Signed)
 A fib: Continue home Eliquis  2.5 mg BID for stroke prophylaxis. No rate/rhythm control (patient is paced) HTN: Home BP medications include amlodipine  10 mg daily at bedtime, lopressor  100 mg TID previously held; will hold for now and consider resuming as indicated HLD: Continue home pravastatin  40 mg daily Schizophrenia: Continue home Olanzapine  15 mg daily at bedtime, Depakote  250 mg BID Strep Bacteremia s/p infected PPM: Continue home Amoxicillin  500 mg BID (lifelong prophylaxis per ID note 01/30/24) Gout: Continue home Allopurinol  50 mg daily T2DM: CBG QACHS. Hold home linagliptin .  Chronic pain: Home regimen includes oxycodone  5 q12 PRN. Consider spot dosing as needed.

## 2024-02-21 NOTE — ED Notes (Signed)
 [name] Arenas, son, would like a call back [phone]

## 2024-02-21 NOTE — Assessment & Plan Note (Addendum)
 S/p IV Lasix  40 mg in ED. Will monitor Is/Os following this and home Lasix  dose. - Admit to FMTS, admitting Dr. Orie, attending Dr. McDiarmid - Med Tele level of care, Vital signs per floor - Diuresis: S/p 40mg  IV Lasix  in ED - redose IV lasix  as indicated and transition to PO diuresis as able  - Oxygen : Currently on 2L O2 via [place]; wean as able - Pain: Tylenol  650 mg q6h PRN - Bowel regimen: Miralax  daily, Senna daily - Continue sodium bicarb 650 mg TID - Continuous cardiac monitoring, pulse ox - Strict Is/Os; follow weight - Follow troponin trend (initial wnl) - Labs: AM CBC, BMP, Mg - Fall precautions - Delirium precautions - Day team to reach out to patient's son, [name], to provide updates and clarify home meds + code status

## 2024-02-21 NOTE — Progress Notes (Signed)
 Heart Failure Navigator Progress Note  Assessed for Heart & Vascular TOC clinic readiness.  Patient does not meet criteria due to per MD note patient with history of Dementia. No HF TOC. .   Navigator will sign off at this time.   Randie Bustle, BSN, Scientist, clinical (histocompatibility and immunogenetics) Only

## 2024-03-05 ENCOUNTER — Telehealth: Payer: Self-pay | Admitting: Infectious Diseases
